# Patient Record
Sex: Male | Born: 1950 | Race: White | Hispanic: No | Marital: Married | Smoking: Former smoker
Health system: Southern US, Community
[De-identification: ages and names within clinical notes are randomized; demographics above are authoritative.]

## PROBLEM LIST (undated history)

## (undated) DIAGNOSIS — M1990 Unspecified osteoarthritis, unspecified site: Secondary | ICD-10-CM

## (undated) DIAGNOSIS — R0602 Shortness of breath: Secondary | ICD-10-CM

## (undated) DIAGNOSIS — Z8719 Personal history of other diseases of the digestive system: Secondary | ICD-10-CM

## (undated) DIAGNOSIS — E119 Type 2 diabetes mellitus without complications: Secondary | ICD-10-CM

## (undated) DIAGNOSIS — I2510 Atherosclerotic heart disease of native coronary artery without angina pectoris: Secondary | ICD-10-CM

## (undated) DIAGNOSIS — K219 Gastro-esophageal reflux disease without esophagitis: Secondary | ICD-10-CM

## (undated) DIAGNOSIS — C4490 Unspecified malignant neoplasm of skin, unspecified: Secondary | ICD-10-CM

## (undated) DIAGNOSIS — E785 Hyperlipidemia, unspecified: Secondary | ICD-10-CM

## (undated) DIAGNOSIS — Z8619 Personal history of other infectious and parasitic diseases: Secondary | ICD-10-CM

## (undated) DIAGNOSIS — I10 Essential (primary) hypertension: Secondary | ICD-10-CM

## (undated) DIAGNOSIS — I716 Thoracoabdominal aortic aneurysm, without rupture, unspecified: Secondary | ICD-10-CM

## (undated) DIAGNOSIS — J439 Emphysema, unspecified: Secondary | ICD-10-CM

## (undated) DIAGNOSIS — E876 Hypokalemia: Secondary | ICD-10-CM

## (undated) DIAGNOSIS — G4730 Sleep apnea, unspecified: Secondary | ICD-10-CM

## (undated) DIAGNOSIS — J189 Pneumonia, unspecified organism: Secondary | ICD-10-CM

## (undated) DIAGNOSIS — D509 Iron deficiency anemia, unspecified: Secondary | ICD-10-CM

## (undated) DIAGNOSIS — J449 Chronic obstructive pulmonary disease, unspecified: Secondary | ICD-10-CM

## (undated) DIAGNOSIS — K635 Polyp of colon: Secondary | ICD-10-CM

## (undated) DIAGNOSIS — R160 Hepatomegaly, not elsewhere classified: Secondary | ICD-10-CM

## (undated) HISTORY — DX: Hypokalemia: E87.6

## (undated) HISTORY — PX: TONSILLECTOMY: SUR1361

## (undated) HISTORY — DX: Essential (primary) hypertension: I10

## (undated) HISTORY — PX: THORACOABDOMINAL AORTIC ANEURYSM REPAIR: SHX2504

## (undated) HISTORY — DX: Shortness of breath: R06.02

## (undated) HISTORY — PX: HIP SURGERY: SHX245

## (undated) HISTORY — PX: TONSILLECTOMY: SHX5217

## (undated) HISTORY — DX: Thoracoabdominal aortic aneurysm, without rupture, unspecified: I71.60

## (undated) HISTORY — DX: Gastro-esophageal reflux disease without esophagitis: K21.9

## (undated) HISTORY — DX: Atherosclerotic heart disease of native coronary artery without angina pectoris: I25.10

## (undated) HISTORY — DX: Hyperlipidemia, unspecified: E78.5

## (undated) HISTORY — DX: Iron deficiency anemia, unspecified: D50.9

## (undated) HISTORY — DX: Morbid (severe) obesity due to excess calories: E66.01

## (undated) HISTORY — DX: Polyp of colon: K63.5

## (undated) HISTORY — DX: Chronic obstructive pulmonary disease, unspecified: J44.9

## (undated) HISTORY — DX: Personal history of other infectious and parasitic diseases: Z86.19

## (undated) HISTORY — DX: Unspecified malignant neoplasm of skin, unspecified: C44.90

## (undated) HISTORY — DX: Pneumonia, unspecified organism: J18.9

## (undated) HISTORY — DX: Unspecified osteoarthritis, unspecified site: M19.90

## (undated) HISTORY — PX: CHOLECYSTECTOMY: SHX55

## (undated) HISTORY — DX: Thoracoabdominal aortic aneurysm, without rupture: I71.6

---

## 2003-02-10 ENCOUNTER — Encounter: Payer: Self-pay | Admitting: Vascular Surgery

## 2003-02-11 ENCOUNTER — Ambulatory Visit (HOSPITAL_COMMUNITY): Admission: RE | Admit: 2003-02-11 | Discharge: 2003-02-11 | Payer: Self-pay | Admitting: Vascular Surgery

## 2003-02-22 ENCOUNTER — Ambulatory Visit (HOSPITAL_COMMUNITY): Admission: RE | Admit: 2003-02-22 | Discharge: 2003-02-22 | Payer: Self-pay | Admitting: Cardiology

## 2003-02-25 ENCOUNTER — Encounter: Payer: Self-pay | Admitting: Cardiology

## 2003-02-25 ENCOUNTER — Ambulatory Visit (HOSPITAL_COMMUNITY): Admission: RE | Admit: 2003-02-25 | Discharge: 2003-02-25 | Payer: Self-pay | Admitting: Cardiology

## 2003-03-11 ENCOUNTER — Encounter: Payer: Self-pay | Admitting: *Deleted

## 2003-03-15 ENCOUNTER — Encounter: Payer: Self-pay | Admitting: *Deleted

## 2003-03-15 ENCOUNTER — Encounter (INDEPENDENT_AMBULATORY_CARE_PROVIDER_SITE_OTHER): Payer: Self-pay | Admitting: *Deleted

## 2003-03-15 ENCOUNTER — Inpatient Hospital Stay (HOSPITAL_COMMUNITY): Admission: RE | Admit: 2003-03-15 | Discharge: 2003-03-23 | Payer: Self-pay | Admitting: Vascular Surgery

## 2003-03-16 ENCOUNTER — Encounter: Payer: Self-pay | Admitting: *Deleted

## 2003-03-17 ENCOUNTER — Encounter: Payer: Self-pay | Admitting: *Deleted

## 2003-03-18 ENCOUNTER — Encounter: Payer: Self-pay | Admitting: *Deleted

## 2003-03-19 ENCOUNTER — Encounter: Payer: Self-pay | Admitting: *Deleted

## 2003-03-20 ENCOUNTER — Encounter: Payer: Self-pay | Admitting: Thoracic Surgery (Cardiothoracic Vascular Surgery)

## 2003-03-20 ENCOUNTER — Encounter: Payer: Self-pay | Admitting: Vascular Surgery

## 2003-03-21 ENCOUNTER — Encounter: Payer: Self-pay | Admitting: Thoracic Surgery (Cardiothoracic Vascular Surgery)

## 2003-10-26 ENCOUNTER — Encounter: Admission: RE | Admit: 2003-10-26 | Discharge: 2003-10-26 | Payer: Self-pay | Admitting: Thoracic Surgery

## 2003-11-03 ENCOUNTER — Encounter: Admission: RE | Admit: 2003-11-03 | Discharge: 2003-11-03 | Payer: Self-pay | Admitting: Thoracic Surgery

## 2004-09-21 ENCOUNTER — Emergency Department (HOSPITAL_COMMUNITY): Admission: EM | Admit: 2004-09-21 | Discharge: 2004-09-21 | Payer: Self-pay | Admitting: Emergency Medicine

## 2004-10-26 ENCOUNTER — Ambulatory Visit (HOSPITAL_COMMUNITY): Admission: RE | Admit: 2004-10-26 | Discharge: 2004-10-26 | Payer: Self-pay | Admitting: Thoracic Surgery

## 2005-03-29 ENCOUNTER — Encounter (INDEPENDENT_AMBULATORY_CARE_PROVIDER_SITE_OTHER): Payer: Self-pay | Admitting: *Deleted

## 2005-03-29 ENCOUNTER — Ambulatory Visit (HOSPITAL_COMMUNITY): Admission: RE | Admit: 2005-03-29 | Discharge: 2005-03-29 | Payer: Self-pay | Admitting: Gastroenterology

## 2005-09-16 HISTORY — PX: CARDIAC CATHETERIZATION: SHX172

## 2005-10-31 ENCOUNTER — Inpatient Hospital Stay (HOSPITAL_COMMUNITY): Admission: EM | Admit: 2005-10-31 | Discharge: 2005-11-01 | Payer: Self-pay | Admitting: Emergency Medicine

## 2006-09-16 DIAGNOSIS — J449 Chronic obstructive pulmonary disease, unspecified: Secondary | ICD-10-CM

## 2006-09-16 HISTORY — DX: Chronic obstructive pulmonary disease, unspecified: J44.9

## 2006-11-29 ENCOUNTER — Inpatient Hospital Stay (HOSPITAL_COMMUNITY): Admission: EM | Admit: 2006-11-29 | Discharge: 2006-12-01 | Payer: Self-pay | Admitting: Emergency Medicine

## 2007-04-13 ENCOUNTER — Ambulatory Visit: Payer: Self-pay | Admitting: Internal Medicine

## 2007-04-27 ENCOUNTER — Ambulatory Visit: Payer: Self-pay | Admitting: Internal Medicine

## 2007-04-27 LAB — CONVERTED CEMR LAB
ALT: 91 units/L — ABNORMAL HIGH (ref 0–53)
AST: 64 units/L — ABNORMAL HIGH (ref 0–37)
Albumin: 3.7 g/dL (ref 3.5–5.2)
Alkaline Phosphatase: 81 units/L (ref 39–117)
BUN: 18 mg/dL (ref 6–23)
CO2: 32 meq/L (ref 19–32)
Calcium: 8.7 mg/dL (ref 8.4–10.5)
Chloride: 103 meq/L (ref 96–112)
Creatinine, Ser: 1.5 mg/dL (ref 0.4–1.5)
GFR calc Af Amer: 62 mL/min
GFR calc non Af Amer: 51 mL/min
Glucose, Bld: 126 mg/dL — ABNORMAL HIGH (ref 70–99)
Potassium: 4.2 meq/L (ref 3.5–5.1)
Sodium: 144 meq/L (ref 135–145)
TSH: 2.46 microintl units/mL (ref 0.35–5.50)
Total Bilirubin: 1 mg/dL (ref 0.3–1.2)
Total Protein: 6.7 g/dL (ref 6.0–8.3)

## 2007-06-30 DIAGNOSIS — Z8679 Personal history of other diseases of the circulatory system: Secondary | ICD-10-CM | POA: Insufficient documentation

## 2007-06-30 DIAGNOSIS — J449 Chronic obstructive pulmonary disease, unspecified: Secondary | ICD-10-CM | POA: Insufficient documentation

## 2007-08-18 ENCOUNTER — Ambulatory Visit: Payer: Self-pay | Admitting: Internal Medicine

## 2007-12-10 ENCOUNTER — Encounter: Admission: RE | Admit: 2007-12-10 | Discharge: 2007-12-10 | Payer: Self-pay | Admitting: Gastroenterology

## 2008-04-18 ENCOUNTER — Encounter: Admission: RE | Admit: 2008-04-18 | Discharge: 2008-06-07 | Payer: Self-pay | Admitting: Gastroenterology

## 2008-06-23 ENCOUNTER — Encounter: Admission: RE | Admit: 2008-06-23 | Discharge: 2008-08-15 | Payer: Self-pay | Admitting: Gastroenterology

## 2008-07-19 ENCOUNTER — Ambulatory Visit: Payer: Self-pay | Admitting: Thoracic Surgery

## 2011-01-29 NOTE — Assessment & Plan Note (Signed)
[facility]                             PULMONARY OFFICE NOTE   Alfred Carney, Alfred Carney                         MRN:          [id number]  DATE:08/18/2007                            DOB:          Jul 23, 1951    This is a pulmonary final follow-up office visit.   HISTORY:  This is a 60 year old white male with morbid obesity, status  post smoking cessation in March 2008, with a progressive weight gain  superimposed on evidence of COPD by PFTs.  I tried him empirically on  Spiriva which he says did nothing to improve his exercise tolerance so  he stopped it.  He rarely feels he needs Combivent.  Overall, feels he  is holding his own despite another 16 pounds of weight gain since his  previous visit.  He denies any PND or orthopnea or daytime  hypersomnolence.   PHYSICAL EXAMINATION:  VITAL SIGNS:  He is afebrile with stable vital  signs.  GENERAL APPEARANCE:  He is a pleasant, ambulatory, obese white male who  has gained another 16 pounds, up to 209 now.  HEENT:  Unremarkable. Oropharynx clear.  LUNGS:  Lung fields are clear to auscultation and percussion  bilaterally.  CARDIOVASCULAR:  Regular rhythm without murmurs, rubs, or gallops.  ABDOMEN:  Soft, benign.  EXTREMITIES:  Warm without calf tenderness, clubbing, cyanosis, or  edema.   LABORATORY DATA:  From his last visit on April 27, 2007, showed a TSH  that was 2.46 and SGOT of 64 and an SGPT of 91 that he says Dr. Frazier Richards  is aware of.   PFTs reviewed with the patient from April 27, 2007, indicating an FEV1  of 54% predicted with a ratio of 45% consistent with at least moderate  air flow obstruction and a diffusion capacity of 45% consistent with  emphysematous features.   His chest CT scan shows no definite emphysema but is worrisome and does  show a very tiny nodule in the right major fissure.   IMPRESSION:  1. Moderate chronic obstructive pulmonary disease with no significant  improvement on Spiriva.  I believe this is because his main      limiting problem is obesity with further weight gain related to      smoking cessation.  I discussed calorie balance issues in detail      with him and strongly recommended consideration for nutrition      evaluation.  I also recommended that he exercise to a level where      he is short of breath but not out of breath 30 minutes daily.  I      used the example of a bank account to try to help him understand      that a calorie balance is exactly that, namely the relationship      between how many calories he takes in versus burns up will      determine ultimately his weight.  We went over these same issues      before but he has yet to completely grasp it.  2. Mild elevation of  liver function tests may be nothing more than      fatty steatosis and I understand that Dr. Frazier Richards is already      working this up. Certainly one of our GI doctors will be happy to      see him if Dr. Frazier Richards feels it is necessary but I will defer that      Dr. Dr. Oliver Pila capable hands.  3. He has a very tiny nodule on the right middle lobe fissure that is      probably benign.  I did recommend a chest x-ray at three months and      then yearly thereafter.     Charlaine Dalton. Sherene Sires, MD, [facility]  Electronically Signed    MBW/MedQ  DD: 08/18/2007  DT: 08/19/2007  Job #: [id number]   cc:   [facility] Dr. Frazier Richards

## 2011-01-29 NOTE — Assessment & Plan Note (Signed)
[facility]                             PULMONARY OFFICE NOTE   Alfred Carney, Alfred Carney                         MRN:          [id number]  DATE:04/27/2007                            DOB:          [birth year]    HISTORY OF PRESENT ILLNESS:  This is a 60 year old white male with  morbid obesity who with a baseline weight of 240 when he stopped smoking  in March of 2008, weighed about 280 and has gained another 13 to 15  pounds since that time. He complains of dyspnea walking up and down the  aisle at the grocery store, but also gives out in his back, knees, and  legs. He does have handicapped parking but does not always use it. He  denies any variability in terms of his dyspnea or associated orthopnea,  PND, leg swelling, fevers, chills, sweats, purulent sputum, or sinus or  reflux symptoms.   PHYSICAL EXAMINATION:  GENERAL:  An obese, ambulatory white man in no  acute distress.  VITAL SIGNS:  Stable.  HEENT:  Unremarkable. Oropharynx clear.  LUNGS:  Fields reveal diminished breath sounds with no wheezes.  HEART:  Regular rate and rhythm. Without murmur, rub, or gallop.  ABDOMEN:  Soft, benign.  EXTREMITIES:  Warm without calf tenderness, cyanosis, clubbing, or  edema.   LABORATORY DATA:  CT scan of the chest was reviewed from April 02, 2007  showing prominent lymphatic tissue in the hila but this is non-  specific finding.   PFT's indicate an FEV1 of 54% predicted with a ratio of 45%, consistent  with severe COPD with inspiratory plateau, mild inspiratory truncation.   IMPRESSION:  1. This patient has severe chronic obstructive pulmonary disease and      is not impressed that Combivent helps. I am going to try him,      therefore, on Spiriva 1 capsule every morning and spend extra time      teaching him how to use it effectively.  2. He clearly is suffering from obesity with deconditioning as well. I      spent extra time reviewing this issue in the  context of a calorie      balance sheet with giving him a goal weight of 202 pounds. I have      also recommended a TSH be checked today to complete the workup.  3. Finally, he does have an abnormal CT scan with prominent lymphatic      tissue in the hila. I suspect this is related to smoking and all I      would do is repeat a chest x-ray when he returns in 3 months.     Charlaine Dalton. Sherene Sires, MD, [facility]  Electronically Signed   MBW/MedQ  DD: 04/27/2007  DT: 04/28/2007  Job #: [id number]   cc:   Dr. Lyn Hollingshead - [facility]

## 2011-01-29 NOTE — Letter (Signed)
July 19, 2008   Billee Cashing, PA  [facility]  [address]  [place], [place]  [address]   Re:  Alfred, [name]                  DOB:  1951-03-31   Dear Ms. Karleen Hampshire,   This patient we saw in 2006 with left costal cartilage pain.  He had a  previous aneurysm repair in which they did a thoracoabdominal incision  and cut across the left costal margin.  At that time, he was having pain  along the left costal margin and we proceeded in doing trigger point  injections and then eventually excise the wire and excise an area of  margins.  He said at first, this helped, but later the day, he says that  it really never helped and he decided not to come back.  He comes back  today with the same type of pain with dysesthesias over the margin and  some palpable trigger point is at approximately 2-3 cm lateral to the  scar.  I discussed this.  His blood pressure was 161/100, pulse 63,  respirations 20, and sats were 96%.  I discussed the situation with him  and told him that if he continued to have pain, this probably will never  go away, that we had really tried  just about everything you could, but  then I would be willing to try trigger point injection with steroids and  Xylocaine.  He said he only has had about a week and was not in favor of  it, and would be happy to reinstitute this.  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  I will be happy to see him  at anytime.   Ines Bloomer, M.D.  Electronically Signed   DPB/MEDQ  D:  07/19/2008  T:  07/20/2008  Job:  [id number]

## 2011-01-29 NOTE — Assessment & Plan Note (Signed)
[facility]                             PULMONARY OFFICE NOTE   LEAM, MADERO                         MRN:          [id number]  DATE:04/13/2007                            DOB:          Oct 03, 1950    REFERRING PHYSICIAN:  Florencia Reasons, Dr.   Jaquita Rector FOR CONSULTATION:  Dyspnea.   HISTORY:  This is a 60 year old white male who states he has been short  of breath ever since he was worked up for hematuria in 2004 and found to  have an aneurysm.  Postoperatively he never got back to normal.  He  has also gained since that time around 40-50 pounds.  He describes not  much difference between his good and bad days.  Presently he is short of  breath walking 15 feet carrying groceries to the car.  In addition, he  is slowed down by his left hip and really, in retrospect, cannot  consistently tell me which one slows him down more.  He denies any  exertional chest pain, significant variability with weather  environmental change and has not yet tried using Combivent immediately  before exercise to see to what extent it helps.   PAST MEDICAL HISTORY:  Significant for:  1. Hypertension for which he says he is on metapalol at 100/25 (to      my knowledge it does not come that way).  2. Emphysema.  3. Hyperlipidemia.   He is status post cholecystectomy and remote back surgery.   ALLERGIES:  Unknown.   SOCIAL HISTORY:  He quit smoking in March of 2008 and at that time had a  cough which has subsequently resolved.  Minimally productive cough.   MEDICATIONS:  Zetia, metoprolol, Prilosec and aspirin, with p.r.n.  ibuprofen, oxycodone and Combivent.   FAMILY HISTORY:  His mother had emphysema and was a smoker.  Also,  brother had asthma.   REVIEW OF SYSTEMS:  Taken in detail on the worksheet.  Positive for  almost every review of systems, none with recent exacerbation.   PHYSICAL EXAMINATION:  This is an obese white male with somewhat of a  hopeless,  helpless affect and attitude.  He is 292 pounds.  He is  afebrile, normal vital signs.  HEENT:  Unremarkable.  OROPHARYNX:  Clear.  LUNG FIELDS:  Clear bilaterally to auscultation and percussion.  HEART:  Regular rate and rhythm without murmur, gallop or rub.  No  increase in P2.  ABDOMEN:  Soft, obese, benign.  EXTREMITIES:  Warm without any calf tenderness, cyanosis, clubbing and  edema.   A CT scan of the chest was reviewed from April 02, 2007, and shows a  hiatal hernia with prominent hilar adenopathy, prominent lymphatic  tissue in the hila but none with pathologic dimensions.   No nodes with pathologic dimensions (greater than 2 cm).   IMPRESSION:  Morbid obesity with poor abdominal compliance and  progressive weight gain status post abdominal aortic aneurysm.  All tie  in clinically to his progressive decline in activity tolerance since  abdominal aortic aneurysm four years ago.  I suspect he  also has a  component of deconditioning and I am not sure to what extent he has  chronic obstructive pulmonary disease.   However, if there enough dynamic hyperinflation or reversible asthma to  treat I would like to try to identify it, and therefore I asked him to  do two things.  First was to use Combivent immediately before exercise  to see to what extent improved, and second is to bring all of his  medicines back in 4 to 6 weeks, check PFTs, at which point I would like  to consider some other medication besides high dose metoprolol for his  hypertension, since to the extent that he is asthmatic he probably  should not be on high dose beta-blockers, evenly relatively specific  ones like metoprolol.   I understand he was here because of concern with a CT showing prominent  lymphatic tissue.  However, none of the lymph nodes that are seen are  pathologic in size and I do not believe this has anything directly to do  with his dyspnea. Hilar adenopathy is either benign/inflammatory or  a  late sign of a serious problem but either way does not need any form of  early intervention.   I spent extra time going over these issues with the patient and also  teaching him optimal MDI technique and  I encouraged him to use the  Combivent up to 2 puffs every 6 hours and return here for PFTs with all  of his medicines in hand to see to what extent he has reversible airflow  obstruction, either clinically or by PFTs.     Charlaine Dalton. Sherene Sires, MD, [facility]  Electronically Signed    MBW/MedQ  DD: 04/13/2007  DT: 04/14/2007  Job #: [id number]   cc:   Florencia Reasons, MD at [facility] on [address].

## 2011-02-01 NOTE — H&P (Signed)
NAME:  Alfred Carney, Alfred Carney NO.:  [id number]   MEDICAL RECORD NO.:  [id number]                   PATIENT TYPE:  OIB   LOCATION:  NA                                   FACILITY:  [facility]   PHYSICIAN:  Peter M. Swaziland, M.D.               DATE OF BIRTH:  [birth year]   DATE OF ADMISSION:  04/24/2003  DATE OF DISCHARGE:                                HISTORY & PHYSICAL   HISTORY OF PRESENT ILLNESS:  Alfred Carney is a 60 year old white male with a  history of hypertension, tobacco abuse, and hypercholesterolemia who was  seen for preoperative evaluation after he was diagnosed with an abdominal  aortic aneurysm.  The patient was being evaluated for hematuria where a CT  scan had an incidental finding of an 8 cm suprarenal aneurysm.  To further  evaluate his cardiac this patient subsequently was referred for an adenosine  Cardiolite study.  This demonstrated the focal area of ischemia in the mid  inferior walls as well as a focal area at the apex.  Left ventricular  function was normal.  The patient does report that he has had some chest  discomfort with exertion over the past several months.  He also reports  awakening Friday night with chest tightness that lasted throughout the night  and half the day Saturday.  This has now resolved.  Because of his abnormal  Cardiolite study is now admitted for cardiac catheterization to see if he  would tolerate aneurysm surgery.   PAST MEDICAL HISTORY:  1. Hypertension,  2. Hypercholesterolemia.  3. History of hematuria.  4. History of [facility] spotted fever.  5. He has had previous cholecystectomy.  6. Left hip surgery.  7. Tonsillectomy.  8. He has had a cyst removal from his back.   ALLERGIES:  No known drug allergies.   CURRENT MEDICATIONS:  1. Hydrochlorothiazide once a day.  2. The patient states he took Lipitor previously but made his chest feel     funny so he stopped taking it.   SOCIAL HISTORY:  The  patient works for [employer], he is married, he  has four children.  He smokes 1/3 to 1 pack per day and has been a smoker  for 40 years.  He denies alcohol use.   FAMILY HISTORY:  Father is age 47 and has had previous myocardial  infarctions and stent.  Mother is age 49 in good health.  He has four  siblings in good health.   REVIEW OF SYSTEMS:  The patient does describe intermittent bilateral flank  pain.  He has also had some low back pain that has been described as mild.  He has a history of acid reflux disease.  Other review of systems are  negative.   PHYSICAL EXAMINATION:  GENERAL:  Obese white male in no current distress.  VITAL SIGNS:  Weight is 255, blood  pressure 120/82, pulse 56 and regular.  HEENT:  Pupils equal, round, reactive to light and accommodation.  Extraocular movements are full.  Oropharynx is clear.  NECK:  Supple without JVD, adenopathy, thyromegaly, or bruits.  LUNGS:  Clear to auscultation and percussion.  CARDIAC:  Regular rate and rhythm without gallops, murmurs, rubs, or clicks.  ABDOMEN:  Obese, soft with a midline pulsatile abdominal mass.  EXTREMITIES:  Without edema.  Pulses are 2+ and symmetric.  NEUROLOGICAL:  Nonfocal.   LABORATORY DATA:  ECG shows normal sinus rhythm and nonspecific T-wave  abnormality, left axis deviation.  Chest x-ray shows increased pulmonary  markings but no active disease.  Heart size is normal.  Hemoglobin is 15.9,  white count 9000, platelets 299,000.  Urinalysis shows occasional white  cells and red cells, otherwise benign.  Coags are normal. CPK-MB is 1.3,  troponin is less than 0.05.  Chemistry panel is normal.   IMPRESSION:  1. Chest pain with abnormal Cardiolite study consistent with obstructive     coronary disease.  2. A large suprarenal abdominal aortic aneurysm.  3. Hypertension.  4. Hypercholesterolemia.  5. Tobacco abuse.  6. History of hematuria.  7. Status post cholecystectomy.   PLAN:  The  patient to be admitted for cardiac catheterization with further  therapy pending these results.                                               Peter M. Swaziland, M.D.    PMJ/MEDQ  D:  02/21/2003  T:  02/21/2003  Job:  [id number]   cc:   Di Kindle. Edilia Bo, M.D.  [address]  [place]  [place] [address]  Fax: [phone]   Gabriel Earing, M.D.  [address]  [place]  [place] [address]  Fax: [phone]

## 2011-02-01 NOTE — Discharge Summary (Signed)
NAME:  Alfred Carney, Alfred Carney NO.:  [id number]   MEDICAL RECORD NO.:  [id number]          PATIENT TYPE:  INP   LOCATION:  5148                         FACILITY:  [facility]   PHYSICIAN:  Lonia Blood, M.D.DATE OF BIRTH:  01-Oct-1950   DATE OF ADMISSION:  11/28/2006  DATE OF DISCHARGE:  12/01/2006                               DISCHARGE SUMMARY   PRIMARY CARE PHYSICIAN:  Gabriel Earing, M.D.   DISCHARGE DIAGNOSES:  1. Acute bronchospastic exacerbation of chronic obstructive pulmonary      disease.      a.     Smoking cessation counseling carried out.      b.     Combivent therapy initiated.  2. Possible sleep apnea.  Outpatient followup recommended.  3. Community-acquired pneumonia.  Intravenous antibiotic therapy      initiated, transitioning to p.o. antibiotics.  4. Hypertension.  5. Hyperlipidemia.  6. Degenerative joint disease.  7. Coronary artery disease, with 70% occlusion of the right coronary      artery per cardiac catheterization.  8. Status post tonsillectomy.  9. Status post cholecystectomy.  10.Status post left hip surgery with bone grafting.  11.Gastroesophageal reflux disease.  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  13.Possible history of [place] Spotted Fever.  14.Hypokalemia, diuretic induced, resolved.   DISCHARGE MEDICATIONS:  1. Prilosec over the counter daily.  2. HCTZ 25 mg daily.  3. Zetia 10 mg daily.  4. Aspirin 81 mg daily.  5. Humibid 600 mg b.i.d. for 7 days, then stop.  6. Combivent inhaler 2 puffs q.i.d.  7. Avelox 400 mg daily for 7 days, then stop.  8. Prednisone 10 mg tablets on a taper dose, beginning at 60 mg b.i.d.      for 2 days, then working down every 2 days to 1 daily for 2 days,      then stop.  9. Potassium chloride 20 mEq daily.   FOLLOWUP:  The patient is advised to follow up with Dr. Andi Devon on  Thursday or Friday of this week (it is presently Monday).  At that time,  a BMET  should be obtained to ensure that the patient's potassium remains  balanced.  Consideration should be given to arranging the patient with  an outpatient polysomnogram to evaluate for possible sleep apnea.   PROCEDURES:  None.   CONSULTATIONS:  None.   HOSPITAL COURSE:  [name] is a 60 year old gentleman who  presented to the hospital on November 28, 2006 with complaints of chills,  shortness of breath, myalgia, and fever to 102.  He had been followed by  his primary care physician who was treating him in the outpatient  setting.  Unfortunately, he did not improve with this therapy.  In the  emergency room, he was diagnosed with a pneumonia and also found to be  suffering with an acute exacerbation of bronchospastic COPD.  He was  admitted to the acute units.  A hypokalemia was appreciated.  This was  felt to be secondary to his diuretic therapy.  Magnesium level was  checked and was found to  be normal.  With inhalation therapy and  systemic steroids, the patient's bronchospasm was able to be arrested.  Respiratory status remained stable throughout the remainder of  hospitalization.  With IV antibiotics, the patient clinically improved  significantly from the standpoint of a possible pneumonia.  At the time  of discharge, the patient's respiratory status was better than it's  been for years now.  The direct link between the patient's emphysema  and his tobacco abuse was explained to the patient.  The absolute need  to discontinue smoking completely was also explained to the patient.  He  voiced understanding and appeared to be highly motivated to do so.   On December 01, 2006, the patient had returned to his baseline respiratory  status.  Vital signs were stable, and he was afebrile.  The patient was  cleared for discharge.      Lonia Blood, M.D.  Electronically Signed     JTM/MEDQ  D:  12/01/2006  T:  12/01/2006  Job:  [id number]   cc:   Gabriel Earing, M.D.

## 2011-02-01 NOTE — Discharge Summary (Signed)
NAME:  [name], GEAR NO.:  [id number]   MEDICAL RECORD NO.:  [id number]                   PATIENT TYPE:  INP   LOCATION:  3307                                 FACILITY:  [facility]   PHYSICIAN:  Di Kindle. Edilia Bo, M.D.        DATE OF BIRTH:  1951/07/29   DATE OF ADMISSION:  03/15/2003  DATE OF DISCHARGE:  03/23/2003                                 DISCHARGE SUMMARY   PRIMARY ADMITTING DIAGNOSIS:  Thoracoabdominal aortic aneurysm.   ADDITIONAL/DISCHARGE DIAGNOSES:  1. An 8-cm type IV thoracoabdominal aortic aneurysm.  2. Hypertension.  3. Hyperlipidemia.  4. Degenerative joint disease.  5. History of [place] Spotted Fever.   PROCEDURES PERFORMED:  1. Repair of type IV thoracoabdominal aortic aneurysm via left thoracotomy     approach.  2. Aorta to right femoral and left external iliac artery bypass graft.  3. Implantation of two right renal arteries and one left renal artery.   HISTORY:  Alfred Carney is a 60 year old white male who was undergoing a  workup for hematuria recently.  He was found, on CT scan of the abdomen and  pelvis, to have an 8-cm incidental abdominal aortic aneurysm.  He was  referred to Dr. Waverly Ferrari for further evaluation.  An arteriogram  was performed, which confirmed an 8-cm abdominal aortic aneurysm, which  extended to the level of the superior mesenteric artery and involved both  renal arteries, extending to the aortic bifurcation.  It was felt that in  light of the size of this aneurysm as well as the extent that he would  require repair at this time and this would be best performed via a  thoracoabdominal approach.  He was also seen by Dr. Madilyn Fireman, and it was agreed  that this was his best course of action.  He saw Dr. Peter Swaziland  preoperatively and was cleared from a cardiac standpoint to proceed.   HOSPITAL COURSE:  He was admitted on March 15, 2003, and was taken to the  operating room where he  underwent thoracoabdominal aortic aneurysm repair  with reimplantation of the renal arteries, as described in detail above.  He  tolerated the procedure well and was transferred to the SICU in stable  condition.   He was initially maintained on nitroglycerin drip for blood pressure control  as well as renal-dosed dopamine.  Initially, his creatinine trended upward  and ultimately leveled off at 2.9 on postop day two; this has trended back  downward and has stabilized at 1.8.  He has continued to have good urine  output, and ultimately, the renal-dosed dopamine was weaned and  discontinued.  He remained on ventilatory support secondary to sedation  throughout most of the day on postop day one.  As his neurological function  improved and he began to awaken later in the day on postop day one, he was  able to be extubated.  He was started on  nebulizer treatments and, from a  pulmonary standpoint, has remained stable.   By postop day two, his NG tube was able to be removed.  Also, his lumbar  drain was removed.  He was mobilized in the unit.  His chest tube drainage  trended downward, and by postop day three, his chest tube was able to be  removed.  His bowel function has slowly returned.   On postop day four, he was started on clear liquids; this was slowly  advanced to full liquids and finally to a regular diet by postop day six.  He has been treated with aggressive pulmonary toilet measures and presently  is maintaining O2 sats of greater than 90% on room air.  He is tolerating a  regular diet without difficulty.  He is having normal bowel movements and is  passing flatus.  He has remained afebrile, and all vital signs have been  stable.  He has been ambulating in the halls without difficulty.  His renal  failure has stabilized, and his creatinine is presently 1.8 with a BUN of  28.  He has maintained good urine output.  His other labs show a hemoglobin  of 11, hematocrit of 32, white  blood cell count of 11,900, platelets 197,  sodium is 140, potassium 3.7.  Initially, he had some elevation of his blood  sugars, requiring the Glucomander protocol, and this was later switched to  Lantus insulin; his blood sugars have been stable over the past several  days, running in the low 100s.  It was felt this was possibly stress-induced  hyperglycemia and will most likely need to be worked up as an outpatient.  He is otherwise doing well.  It is felt that if he continues to remain  stable, he will be ready for discharge home probably on March 23, 2003.   DISCHARGE MEDICATIONS:  1. Pepcid 20 mg b.i.d.  2. Hydrochlorothiazide 25 mg daily.  3. Atacand 16 mg daily.  4. Enteric-coated aspirin 325 mg daily.  5. Tylox one to two q.4h. p.r.n. for pain.   DISCHARGE INSTRUCTIONS:   ACTIVITY:  1. He is to refrain from driving, heavy lifting, or strenuous activity.  2. He may continue daily walking and use of his incentive spirometer.   WOUND CARE:  He was asked to shower daily and clean his incisions with soap  and water.   DISCHARGE FOLLOW UP:  1. The CVTS nurse will remove his staples on Tuesday, March 29, 2003, at 9     a.m.  2. He will then follow up with Dr. Edilia Bo on Wednesday, April 13, 2003, at     2:00 p.m.; he will have ankle-brachial indices at that time.  Of note,     his postop ABIs were greater than 1.0 on the right and 0.79 on the left.  3. He was also asked to make a followup appointment in the next one to two     weeks with his primary care physician for reevaluation of his     hyperglycemia.     Coral Ceo, P.A.                        Di Kindle. Edilia Bo, M.D.    GC/MEDQ  D:  03/22/2003  T:  03/22/2003  Job:  [id number]   cc:   Loraine Leriche C. Vernie Ammons, M.D.  [address]  [place]  [place] [address]  Fax: [phone]   Gabriel Earing, M.D.  [address]  [place]  [place] [address] Fax: [phone]   Peter M. Swaziland, M.D.  [address]   [place], [place] [address]  Fax: [phone]    cc:   Veverly Fells. Vernie Ammons, M.D.  [address]  [place]  [place] [address]  Fax: [phone]   Gabriel Earing, M.D.  [address]  [place]  [place] [address]  Fax: [phone]   Peter M. Swaziland, M.D.  [address]  [place], [place] [address]  Fax: [phone]

## 2011-02-01 NOTE — H&P (Signed)
NAME:  Alfred Carney, Alfred Carney NO.:  [id number]   MEDICAL RECORD NO.:  [id number]          PATIENT TYPE:  INP   LOCATION:  3707                         FACILITY:  [facility]   PHYSICIAN:  Peter M. Swaziland, M.D.  DATE OF BIRTH:  02/22/1951   DATE OF ADMISSION:  10/31/2005  DATE OF DISCHARGE:                                HISTORY & PHYSICAL   HISTORY OF PRESENT ILLNESS:  Alfred Carney is a 60 year old white male, with  known history of coronary disease, hypertension, hyperlipidemia, who  presents for evaluation of chest pain today. The patient had no prior  history of chest pain until this morning where approximately 9:30 to 10 a.m.  he developed retrosternal chest pain radiating across his chest. He had no  associated shortness of breath, nausea, vomiting or diaphoresis. The pain  was worse with exertion. He went to [facility] where his pain was relieved  with sublingual nitroglycerin and aspirin but his pain duration was  approximately two hours. The patient did undergo cardiac catheterization  June of 2004. At that time, he had a stress Cardiolite study which suggested  inferoapical ischemia. Subsequent cardiac catheterization demonstrated a 50-  60% stenosis in the mid-right coronary artery. He had small distal vessels.  He was treated medically.   PAST MEDICAL HISTORY:  1.  Status post repair of thoraco-abdominal aneurysm type 4 in June of 2004      via a left thoracotomy approach. He also had aorto right femoral and      left iliac bypass grafting at that time with reimplantation of his renal      arteries.  2.  Status post excision of painful costicartilage in February of 2006.  3.  Hypertension.  4.  Hyperlipidemia.  5.  Degenerative joint disease.  6.  History of [place] Spotted Fever.  7.  Status post cholecystectomy.  8.  Status post left hip surgery with bone grafting.  9.  Status post tonsillectomy.   MEDICATIONS:  1.  HCTZ 25 milligrams per day.  2.   Pain medication daily.  3.  Cholesterol medication daily.   ALLERGIES:  He has no known drug allergies.   SOCIAL HISTORY:  He is married. He has four children. He works with a  Location manager. He smokes less than or equal to one pack per day. He has  been smoking for 40 years.   FAMILY HISTORY:  His father had a myocardial infarction and stent. He is now  35 and doing well. His mother is alive and well. He has four siblings who  are alive and well.   REVIEW OF SYSTEMS:  Otherwise unremarkable.   PHYSICAL EXAMINATION:  GENERAL: The patient is an obese white male in no  apparent distress.  VITAL SIGNS: Blood pressure 127/70, pulse 50 and regular. He is afebrile.  Saturations are 98% on room air.  LUNGS: Clear.  CARDIOVASCULAR: Regular rate and rhythm without murmur, rub, or gallop.  ABDOMEN: Old surgical scar. There are no masses or bruits.  EXTREMITIES: Femoral and pedal pulses are 2+ and symmetric. He has no edema.  NEUROLOGICAL: Nonfocal.   LABORATORY DATA:  Chest x-ray shows COPD with no active disease. ECG shows  normal sinus rhythm with T-wave inversion in leads I, aVL and V3. Sodium is  140, potassium 3.6, chloride 106, CO2 29, BUN 20, creatinine 1.6, glucose of  99. Hemoglobin of 16.3. Initial CK was less than 1.0. Subsequently was 1.3.  Troponin was less than 0.05. Subsequent level is 0.07.   IMPRESSION:  1.  Unstable angina pectoris.  2.  History of coronary disease.  3.  Status post thoraco-abdominal aneurysm repair.  4.  Hypertension.  5.  Hyperlipidemia.  6.  Tobacco abuse.   PLAN:  We will admit to telemetry. We will rule out myocardial infarction.  We will treat with subcutaneous Lovenox and blood pressure control. We will  not give beta blocker due to his slow resting pulse. We will anticipate  cardiac catheterization tomorrow afternoon.           ______________________________  Peter M. Swaziland, M.D.     PMJ/MEDQ  D:  10/31/2005  T:  10/31/2005  Job:   [id number]   cc:   Gabriel Earing, M.D.  Fax: [phone]   Ines Bloomer, M.D.  [address]  [place]  [place] [address]

## 2011-02-01 NOTE — H&P (Signed)
NAME:  Alfred Carney, Alfred Carney NO.:  [id number]   MEDICAL RECORD NO.:  [id number]          PATIENT TYPE:  EMS   LOCATION:  MAJO                         FACILITY:  [facility]   PHYSICIAN:  Manning Charity, MD     DATE OF BIRTH:  [birth year]   DATE OF ADMISSION:  11/28/2006  DATE OF DISCHARGE:                              HISTORY & PHYSICAL   PRIMARY CARE PHYSICIAN:  Gabriel Earing, M.D.   CHIEF COMPLAINT:  Shortness of breath.   HISTORY OF PRESENT ILLNESS:  Alfred Carney is a 60 year old man with past  medical history as noted below who presents with two weeks of chills,  shortness of breath, myalgias and fevers with a T-max of 102 degrees.  He presented to his primary care physician with these complaints two  weeks ago.  At that time, he was started on prednisone and an unknown  antibiotic.  Despite this treatment, however, he has continued to have  persistent symptoms.  He does not feel like his symptoms have gotten  worse per se but they have certainly not gotten better.  Of note, he  does deny any chest pain accompanying the symptoms.   CURRENT MEDICATIONS:  1. Prilosec over-the-counter once a day.  2. Hydrochlorothiazide 25 mg daily.  3. Zetia 10 mg daily.  4. Aspirin 81 mg daily.  5. He does use ibuprofen as needed.  6. He uses oxycodone 20 mg occasionally for severe pain from his      abdominal surgery.   ALLERGIES:  NO KNOWN DRUG ALLERGIES.   PAST MEDICAL HISTORY:  1. He had a thoracoabdominal aneurysm, status post repair.  2. Hypertension.  3. Hyperlipidemia.  4. Degenerative joint disease.  5. Coronary artery disease with a 70% occlusion in the RCA per      catheterization.  6. He is status post cholecystectomy.  7. Status post tonsillectomy.  8. Status post left hip surgery with bone grafting.  9. He has acid reflux disease.  10.He has a possible history of [facility] spotted fever, although      it is very unclear whether this was an actual diagnosis  or not.   FAMILY HISTORY:  Noncontributory, but his mother is living with  osteoarthritis and diabetes mellitus.  Father is living with coronary  artery disease.  He has one sister with multiple sclerosis and a brother  with unknown health problems.   SOCIAL HISTORY:  He quit smoking two weeks ago at the onset of his  symptoms.  Before that, he has been a 1-2 pack-per-day smoker and  recently cut down to half a pack per day.  He denies any alcohol use.  He is married, works in Biomedical engineer.   REVIEW OF SYSTEMS:  Positive for fever and chills as noted above.  Also  positive for fatigue and myalgias.  Negative for any weight changes.  Negative for abdominal pain, changes in his bowel movements.  Positive  for mild nausea but no emesis.  No hematochezia, no melena.  He does  report some orthopnea but it is unclear whether this is true orthopnea  or whether he sleeps propped up on pillows because of right shoulder  pain.  He denies any nocturia.  He denies any peripheral edema.  He  denies any cough or hemoptysis but does feel like he has some chest  congestion.  Denies any dysuria.   PHYSICAL EXAMINATION:  GENERAL APPEARANCE:  He is an obese male,  slightly diaphoretic, otherwise no acute distress.  VITAL SIGNS:  Temperature 99.1, pulse 74, blood pressure 126/75,  respiratory rate 18 and he is sating 92% on 2 liters of oxygen.  NECK:  Obese, unable to determinate JVP based on this.  No thyromegaly  palpated.  CHEST:  He has poor air movement with diffuse expiratory wheezing.  No  distinct crackles heard.  CARDIOVASCULAR:  Regular rate and rhythm with no murmurs, rubs, or  gallops appreciated.  ABDOMEN:  Soft with multiple surgical scars.  He has normoactive bowel  sounds.  He does have tenderness to palpation in the left upper  quadrant.  EXTREMITIES:  Without any edema, no cyanosis.  SKIN:  Without any rashes or acute abnormalities.  NEUROLOGIC:  He is alert and oriented x4  and his exam is nonfocal.   LABORATORY DATA:  All we have is a hemoglobin of 18.4 and one BMET with  a sodium of 133, potassium 3.1, chloride 110, bicarbonate 24, BUN 23,  creatinine 1.8.  That would give him an anion gap of -1.  Another BMET  has a potassium of 2.9, anion gap 13.   We do not have any chest x-ray or imaging on him.   A 12-lead EKG is without any acute STT wave changes or other  abnormalities.   ASSESSMENT/PLAN:  1. Pneumonia.  Given his hypoxia, will admit him, treat him with Solu-      Medrol,  broad spectrum antibiotics  and nebulizer treatments.      Will check sputum culture, urine for Strep and Legionella antigens      and monitor his clinical course.  2. Hypokalemia.  On diuretic.  Will check magnesium as      hydrochlorothiazide could not have caused hypomagnesemia and      replete his potassium.  3. Renal insufficiency with baseline creatinine unknown.  Most      probably secondary to some mild dehydration secondary to the      pneumonia with diuretic therapy in addition to this.  Will hold his      diuretic right now while we hydrate him with IV fluids and avoid      nephrotoxic medications.  4. Possible anion gap.  Will check a CMET to further quantify this as      his true anion gap is unclear to me and proceed based on that.  If      he does truly have an anion gap, will check a lactate level, serum      osmolality and a      salicylate level.  5. Erythrocytosis.  Most likely secondary to smoking plus some      dehydration.  Will check a true CBC with differential, hydrate and      follow.      Manning Charity, MD     KK/MEDQ  D:  11/29/2006  T:  11/29/2006  Job:  [id number]

## 2011-02-01 NOTE — H&P (Signed)
NAME:  Alfred Carney, RUANE NO.:  [id number]   MEDICAL RECORD NO.:  [id number]                   PATIENT TYPE:  INP   LOCATION:  NA                                   FACILITY:  [facility]   PHYSICIAN:  Di Kindle. Edilia Bo, M.D.        DATE OF BIRTH:  1950-12-20   DATE OF ADMISSION:  03/15/2003  DATE OF DISCHARGE:                                HISTORY & PHYSICAL   CHIEF COMPLAINT:  Abdominal aortic aneurysm.   HISTORY OF PRESENT ILLNESS:  The patient is a 60 year old white male who was  referred to Dr. Di Kindle. Dickson by Dr. Veverly Fells. Ottelin for evaluation  of an abdominal aortic aneurysm.  Several months ago, he had developed high  fever and joint pain and saw his primary care physician, who treated him  with antibiotics for [facility] spotted fever.  When he returned for  followup, a urinalysis was performed which showed hematuria.  He was  referred to Dr. Vernie Ammons for workup for his hematuria and during the course  of the workup, underwent a CT scan.  This showed an incidental 8-cm  abdominal aortic aneurysm.  Because of this, he was referred to Dr.  Waverly Ferrari for evaluation.  An arteriogram was performed which  confirmed an 8-cm abdominal aortic aneurysm which extended to the level of  the superior mesenteric artery and involved both renal arteries, extending  to the aortic bifurcation.  The patient has had continued generalized  abdominal pain but no back pain.  He also denies nausea, vomiting,  constipation, hematochezia and melena or hematemesis.  It was recommended  that in light of the size of this aneurysm, that he proceed with surgical  repair at this time.  Also, he has been seen by Dr. Balinda Quails, as the  aneurysm will likely require a thoracoabdominal approach for repair.  He has  also seen Dr. Peter M. Swaziland and has undergone a Cardiolite study and a  cardiac catheterization and has been cleared to proceed.   PAST  MEDICAL HISTORY:  1. Hypertension.  2. Hyperlipidemia.  3. Questionable history of [facility] spotted fever, treated with     antibiotic therapy.  4. History of degenerative joint disease.   PAST SURGICAL HISTORY:  1. Cholecystectomy in 1992.  2. Tonsillectomy.  3. Left hip surgery in 1991 where a bone graft was taken from his leg and     grafted to his hip.   CURRENT MEDICATIONS:  1. Hydrochlorothiazide 25 mg q.a.m.  2. Atacand 16 mg q.p.m.  3. He is supposed to be taking Lipitor, but this caused a funny feeling in     his chest, therefore, he discontinued it himself.   ALLERGIES:  No known drug allergies.   FAMILY HISTORY:  His mother is age 62 and alive and well.  His father is age  13.  He has had a history of myocardial infarction.  He has four siblings  who are all alive and well.   SOCIAL HISTORY:  He is married and has four children.  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  He denies alcohol use.  He has  previously smoked one and a half to two packs of cigarettes per day but  currently has cut down to one-half pack per day.  He has smoked for about 35  years.   REVIEW OF SYSTEMS:  See history of present illness for pertinent positives  and negative.  Also, he has had occasional palpitations which have been  evaluated by Dr. Swaziland.  He also has generalized lower extremity edema  which has improved since he started on the hydrochlorothiazide.  He has some  shortness of breath with exertion.  He also has a history of degenerative  joint disease and has joint pain related to this.  He denies fevers, chills,  weight loss, recent infections, TIA symptoms, amaurosis fugax, visual  changes, dysphagia, syncope, chest pain, cough, orthopnea, paroxysmal  nocturnal dyspnea, dysuria, hematuria, nocturia, lower extremity  claudication symptoms, depression, anxiety, intolerance to heat or cold.   PHYSICAL EXAMINATION:  VITAL SIGNS:  Blood pressure is  164/104.  Pulse is 80  and regular.  Respirations 16 and unlabored.  GENERAL:  This is an obese white male in no acute distress.  HEENT:  Normocephalic, atraumatic.  Pupils equal, round and react to light  and accommodation.  Extraocular movements intact.  TMs and canals are clear  bilaterally.  Nares patent.  Oropharynx is clear with upper and lower  dentures in place.  NECK:  Neck supple without lymphadenopathy, thyromegaly or carotid bruits.  LUNGS:  Lungs clear to auscultation.  HEART:  Regular rate and rhythm without murmurs, rubs, or gallops.  ABDOMEN:  Abdomen soft, obese, nontender and nondistended with active bowel  sounds in all quadrants.  There is a palpable pulsatile midline mass.  EXTREMITIES:  No clubbing or cyanosis.  He has mild lower extremity edema.  Feet are warm and well-perfused.  Femoral, dorsalis pedis and posterior  tibial pulses are all 2+ and symmetrical.  NEUROLOGIC:  Cranial nerves II-XII are grossly intact.  Gait is without  impairment.  Motor and sensory are intact.   ASSESSMENT AND PLAN:  This is a 60 year old white male with an abdominal  aortic aneurysm of 8 cm who will be admitted to [facility] on March 15, 2003 for repair of the same.     Coral Ceo, P.A.                        Di Kindle. Edilia Bo, M.D.    GC/MEDQ  D:  03/11/2003  T:  03/12/2003  Job:  [id number]   cc:   Loraine Leriche C. Vernie Ammons, M.D.  [address]  [place]  [place] [address]  Fax: [phone]   Gabriel Earing, M.D.  [address]  [place]  [place] [address]  Fax: [phone]   Peter M. Swaziland, M.D.  [address]  [place], [place] [address]  Fax: [phone]    cc:   Veverly Fells. Vernie Ammons, M.D.  [address]  [place]  [place] [address]  Fax: [phone]   Gabriel Earing, M.D.  [address]  [place]  [place] [address]  Fax: [phone]   Peter M. Swaziland, M.D.  [address]  [place], [place] [address] Fax:  [phone]  

## 2011-02-01 NOTE — Op Note (Signed)
NAME:  Alfred Carney, Alfred Carney NO.:  [id number]   MEDICAL RECORD NO.:  [id number]          PATIENT TYPE:  OIB   LOCATION:  2899                         FACILITY:  [facility]   PHYSICIAN:  Ines Bloomer, M.D. DATE OF BIRTH:  [birth year]   DATE OF PROCEDURE:  10/26/2004  DATE OF DISCHARGE:                                 OPERATIVE REPORT   PREOPERATIVE DIAGNOSIS:  Painful costal cartilage, status post  thoracoabdominal incision.   POSTOPERATIVE DIAGNOSIS:  Painful costal cartilage, status post  thoracoabdominal incision.   OPERATION PERFORMED:  Excision of costal cartilage.   SURGEON:  Ines Bloomer, M.D.   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  Postoperatively she continues to have  severe pain over the left costal cartilage margin, where it had been divided  for the thoracoabdominal incision.  Trigger point injections were tried with  steroids and Xylocaine, and multiple types of pain medication with only just  some slight improvement.  It actually improved for approximately six months,  and then he had a severe recurrence of the pain.  The pain primarily comes  on when he moves and twists from one side to the other side.   A CT scan showed irregularity at the area of division and it was thought  that this may be there is either a neuroma or some type of entrapment of the  costal margin.  Excision of this area was recommended and also removed a lot  of the wire.  When all other avenues had been exhausted as far as pain  control, the patient agreed to this.   DESCRIPTION OF PROCEDURE:  The patient underwent general anesthesia; was  prepped and draped in the usual sterile manner.  A 3 cm incision was made  through the previous thoracoabdominal excision.  Dissection was carried down  to the subcutaneous tissues down to the area of the division.  This was done  with electrocautery.  The muscle was  reflected off the costal margin, and  using an Alexander periosteal elevator the fascia was placed back.  There  was a lot inflammation in this area.  The wire could be seen, and it was cut  and removed.  After the wire had been removed, the costal cartilage was  debrided superiorly and inferiorly over the area where it was cut -- in  order to free up any areas.  Old inflammatory cartilage was removed, to give  at least a 1.0 to 1.5 cm gap in the costal margin; hopefully to prevent any  entrapment or pain of any neuromas.   After this had been done, the area was irrigated copiously and then the  muscle was reclosed with interrupted 2-0 Vicryl, subcutaneous tissue with 3-  0 Vicryl and subcuticular stitch with 3-0 Vicryl and Dermabond for the skin.   The patient returned to the recovery room in stable condition.      DPB/MEDQ  D:  10/26/2004  T:  10/26/2004  Job:  [id number]   cc:   Di Kindle. Edilia Bo, M.D.  [address]  [address]  [place]  [place] [address]

## 2011-02-01 NOTE — Discharge Summary (Signed)
NAME:  Alfred Carney, Alfred Carney NO.:  [id number]   MEDICAL RECORD NO.:  [id number]          PATIENT TYPE:  INP   LOCATION:  3707                         FACILITY:  [facility]   PHYSICIAN:  Peter M. Swaziland, M.D.  DATE OF BIRTH:  Nov 15, 1950   DATE OF ADMISSION:  10/31/2005  DATE OF DISCHARGE:  11/01/2005                                 DISCHARGE SUMMARY   HISTORY OF PRESENT ILLNESS:  Alfred Carney is a 60 year old male with history  of coronary disease, hypertension, hyperlipidemia. 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 He is status  post aorta right femoral and left iliac bypass grafting as well as  reimplantation of his renal arteries at that time. The patient presented  with symptoms of chest pain on the morning of admission which was described  as a retrosternal chest pain. It seemed to be worse with exertion and  radiated across his chest. The patient had previous cardiac catheterization  2004 which showed 50-60% stenosis in the mid-right coronary which was a  small nondominant vessel. He had small distal vessel disease and was treated  medically. For details of his past medical history, social history, family  history and physical exam, please see admission history and physical.   LABORATORY DATA:  A pH 7.36, pCO2 of 52.6, bicarb 29. White count was 8800,  hemoglobin 16.4, hematocrit 48.8, platelets 233,000. Coags were normal.  Sodium 140, potassium 3.5, chloride 100, CO2 32, glucose 91, BUN 15,  creatinine 1.4. Liver function studies were normal except for an ALT of 55.  Serial CPKs and MBs x3 were negative. Serial troponin Is x3 were negative.  Cholesterol was 189, triglycerides 182, HDL 31, LDL 122. ECG showed normal  sinus rhythm with nonspecific T-wave abnormality. Chest x-ray showed COPD  with scarring but no active disease.   HOSPITAL COURSE:  The patient was admitted to telemetry. He was treated with  subcutaneous  Lovenox. He was placed on nitroglycerin. On November 01, 2005,  he underwent cardiac catheterization. This demonstrated no significant  coronary disease in the left main, LAD or left circumflex distribution. In  the right coronary artery is a small vessel which had a 60% stenosis in the  midvessel and a very tortuous segment right after the takeoff of the RV  marginal branch. Left ventricular function was normal with an ejection  fraction of 60%. Compared to his prior study in 2004, there was no  significant change. For this reason, we felt this pain was noncardiac. We  recommended continuing medical therapy, chronic blood pressure control with  metoprolol and HCTZ. We did recommend the addition of Zocor for his  hyperlipidemia and recommended smoking cessation. He was discharged home on  same day in stable condition.   DISCHARGE DIAGNOSES:  1.  Chest pain.  2.  Coronary artery disease.  3.  Status thoracoabdominal aneurysm repair.  4.  Hypertension.  5.  Hyperlipidemia.  6.  Tobacco abuse.   DISCHARGE MEDICATIONS:  1.  Metoprolol/HCTZ 100/25 mg per day.  2.  Zetia 10 milligrams  per day.  3.  Zocor 20 milligrams per day.  4.  Prevacid 30 milligrams per day.  5.  Aspirin daily.  6.  Diclofenac 75 milligrams b.i.d.  7.  Nitroglycerin p.r.n.   Patient is instructed in smoking cessation, recommended low fat, low sodium  diet. Recommend follow up with Dr. Swaziland in two weeks. Discharge status is  improved.           ______________________________  Peter M. Swaziland, M.D.     PMJ/MEDQ  D:  12/05/2005  T:  12/06/2005  Job:  [id number]   cc:   Ines Bloomer, M.D.  [address]  [place]  [place] [address]   Gabriel Earing, M.D.  Fax: [phone]

## 2011-02-01 NOTE — Cardiovascular Report (Signed)
NAME:  Alfred Carney, Alfred Carney NO.:  [id number]   MEDICAL RECORD NO.:  [id number]                   PATIENT TYPE:  OIB   LOCATION:  2899                                 FACILITY:  [facility]   PHYSICIAN:  Peter M. Swaziland, M.D.               DATE OF BIRTH:  [birth year]   DATE OF PROCEDURE:  02/22/2003  DATE OF DISCHARGE:  02/22/2003                              CARDIAC CATHETERIZATION   INDICATION FOR PROCEDURE:  The patient is a 60 year old male with newly  diagnosed abdominal aortic aneurysm.  On preoperative evaluation he had an  abnormal Cardiolite study showing mild inferior and apical ischemia.  The  patient has a history of tobacco abuse, hypertension, hypercholesterolemia.   ACCESS:  Via the right femoral artery using standard Seldinger technique.  The abdominal aorta was crossed with a Wholey wire.  We did all catheter  exchanges over a long exchange wire.  There were no complications.   CONTRAST:  110 mL of Omnipaque.   MEDICATIONS:  Local anesthesia 1% Xylocaine.   HEMODYNAMIC DATA:  1. Aortic pressure was 151/94 with a mean of 117.  2. Left ventricular pressure was 173 with EDP of 17 mmHg.  3. By simultaneous recording there was no significant aortic valve gradient.   ANGIOGRAPHIC DATA:  1. The left coronary artery arises normally and has an inferior course.     Left main coronary is long without significant disease.  2. Left anterior descending artery has a diffusely small distal vessel     without focal stenosis.  3. The left circumflex coronary artery is without significant disease.     Again, the distal vessel appears small in caliber.  4. The right coronary artery is a codominant vessel.  There is a 50-60%     stenosis in the mid vessel.  This is prior to trifurcation of the vessel.   LEFT VENTRICULOGRAPHY:  Left ventricular angiography performed in the RAO  view demonstrates normal left ventricular size and contractility with normal  systolic function.  Ejection fraction is estimated at 60%.  There is no  mitral regurgitation or prolapse.   FINAL INTERPRETATION:  1. Borderline obstructive coronary disease in the mid right coronary artery.  2. Normal left ventricular function.    PLAN:  Would recommend patient proceed with repair of his abdominal aortic  aneurysm.  Recommend risk factor modification.                                               Peter M. Swaziland, M.D.    PMJ/MEDQ  D:  02/22/2003  T:  02/23/2003  Job:  [id number]   cc:   Balinda Quails, M.D.  [address]  [place]  [place] [address]  Fax: [phone]   Di Kindle.  Edilia Bo, M.D.  [address]  [place]  [place] [address]  Fax: [phone]

## 2011-02-01 NOTE — Op Note (Signed)
NAME:  RAJVIR, ERNSTER NO.:  [id number]   MEDICAL RECORD NO.:  [id number]                   PATIENT TYPE:  INP   LOCATION:  2303                                 FACILITY:  [facility]   PHYSICIAN:  Di Kindle. Edilia Bo, M.D.        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   POSTOPERATIVE DIAGNOSIS:  Type 4 thoracoabdominal aneurysm 8.0 cm.   PROCEDURE:  1. Repair of type 4 thoracoabdominal aneurysm via a thoracoabdominal  approach.  1. Aorta-right femoral-left external iliac artery bypass graft.  2. Implantation of two right renal arteries and one left renal artery.   SURGEON:  Di Kindle. Edilia Bo, M.D.   COSURGEONBalinda Quails, M.D.   NOTATION:  Dr. Madilyn Fireman has dictated the thoracotomy component of the  procedure, and also the closure of the thoracotomy component, and closure of  the diaphragm.   INDICATIONS FOR PROCEDURE:  This is a 60 year old gentleman who during a  workup for hematuria was found to have an 8.0 cm suprarenal abdominal aortic  aneurysm.  This appeared to extend up to the posterior level of the celiac  axis.  Given the extent of the aneurysm, it was felt that the only real  option for repair was a thoracoabdominal approach.  He was seen in  consultation by Dr. Madilyn Fireman, and we agreed that this was the best approach.  Given the size of the aneurysm with a 20% per year risk of rupture. We  proceed with an elective repair after a preoperative workup by Dr. Peter M.  Swaziland.   DESCRIPTION OF PROCEDURE:  The patient was taken to the operating room after  a Swan-Ganz catheter and arterial line were placed by anesthesia.  He  received a general anesthetic; then a lumbar drain was placed.  The patient  was then carefully positioned with the shoulders rolled to the left.  Then a  hockey stick incision was made through the seventh interspace, extending  down the abdominal midline.  Again the thoracotomy component is dictated  separately.  The abdomen was entered through a midline incision.  The  lateral reflection of the colon was divided, and the left colon, spleen,  pancreas and left kidney were all retracted to the right, thus exposing the  suprarenal aorta.  The supraceliac aorta was identified and controlled with  a loop, and then the dissection carried down distally carefully, and the  celiac axis was identified and controlled with a blue Vesi-loop.  The  superior mesenteric artery was controlled with a blue Vesi-loop.  The left  renal artery was controlled with a blue Vesi-loop.  Next, the dissection was carried inferiorly where the left common iliac  artery was identified.  It had significant plaque.  We then placed an  umbilical tape around this, to be ligated later.  The external iliac artery  was then dissected free, and the plan was for bypass of the external iliac  artery, to  preserve retrograde flow into the hypogastric artery.  On the  right side, there was plaque proximally.  The abdominal contents were  returned to their normal position, and the conventional exposure of the  aorta was performed.  The external iliac artery was quite small, and  therefore we could not go to the iliac on the right side.  Through the  anterior approach the major right renal artery which came off somewhat the  anterior aspect of the aneurysm anteriorly, was identified and controlled  with a blue Vesi-loop.  The patient was then heparinized and received  Mannitol.  The abdominal contents were then reflected again to the right.  A  clamp after both common iliac arteries were ligated with umbilical tapes.  The clamp was placed above the celiac axis, and the celiac  axis, superior  mesenteric artery and left renal artery were all controlled with Vesi-loops.  The aorta was  then entered posteriorly.  A Correll patch was fashioned  around the orifice of the left renal artery.  Next, a 20 x 10 graft was  angulated and then sewn end-to-end to the aorta with the posterior aspect  extending up to the level of the celiac axis.  Within this anastomosis we  were able to incorporate the most-superior right renal artery which was  somewhat small, the superior mesenteric artery and the celiac axis.  Next, the left renal artery anastomosis was performed.  Of note, we had  tested the proximal anastomosis which was hemostatic, and then the clamp was  reapplied to allow reimplantation of the left renal artery.  The appropriate  spot on the graft was selected.  A graftotomy was made and then a 5 mm punch  was used.  The artery was mobilized over and sewn end-to-side to the graft  on the lateral aspect on the left, with a continuous #5-0 Prolene suture.  Next, attention was turned to reimplantation of the right renal artery.  Again a Correll patch was fashioned.  A graftotomy was made and a 5 mm punch  was used.  The right renal artery was reimplanted on the right side of the  graft with #5-0 Prolene suture.  Next, the right limb of the bifurcated graft was pulled down through the  previously-created tunnel for an anastomosis to the common femoral artery  which had been dissected free through an oblique incision in the right  groin.  The distal common femoral artery was controlled with a blue Vesi-  loop, and the proximal artery clamped.  A longitudinal arteriotomy was made.  The graft was then pulled to the appropriate length and spatulated for an  anastomosis to the common femoral artery.  This anastomosis was done end-to-  side with a continuous #5-0 Prolene suture.  Next, the left limb of the graft was brought through the tunnel for an  anastomosis to the external iliac artery.  The external iliac artery was clamped proximally and distally, and a longitudinal arteriotomy  was made.  The graft was cut to the appropriate length, spatulated, and sewn end-to-  side to the external iliac artery using continuous #6-0 Prolene suture.  Next, the anastomoses were all inspected, and good hemostasis was obtained.  There was some slight kinking of the anastomosis of the graft to the left  renal artery.  Therefore this was fully mobilized, and after this there was  a nice lie of this reimplanted left renal artery.  There was good Doppler  flow in the celiacs, SMA, and both renal artery reimplants.  The heparin  which had been given was reversed with Protamine.  The closure of the diaphragm and chest are dictated by Dr. Madilyn Fireman.  The  abdominal wound was closed proximally.  The posterior fascia was closed with  running #1 PDS suture.  The anterior fascial layer was closed with two  running #1 PDS sutures, after the abdominal contents were returned to their  normal position.  The subcutaneous tissue was  closed with running #2-0 Vicryl, and the skin was closed with staples.  A  sterile dressing was applied.  The patient tolerated this procedure well and was transferred to the  intensive care unit in critical condition.  All needle and sponge counts  were correct.                                                   Di Kindle. Edilia Bo, M.D.    CSD/MEDQ  D:  03/15/2003  T:  03/15/2003  Job:  [id number]   cc:   Peter M. Swaziland, M.D.  [address], [address]  [place], [place] [address]  Fax: [phone]   Ihor Gully, M.D.

## 2011-02-01 NOTE — Op Note (Signed)
NAME:  Alfred Carney, Alfred Carney NO.:  [id number]   MEDICAL RECORD NO.:  [id number]                   PATIENT TYPE:  OIB   LOCATION:  NA                                   FACILITY:  [facility]   PHYSICIAN:  Di Kindle. Edilia Bo, M.D.        DATE OF BIRTH:  June 04, 1951   DATE OF PROCEDURE:  02/11/2003  DATE OF DISCHARGE:                                 OPERATIVE REPORT   OPERATION PERFORMED:  1. Aortogram.  2. Bilateral iliac arteriogram.  3. Bilateral lower extremity runoff.   SURGEON:  Di Kindle. Edilia Bo, M.D.   ASSISTANT:  Nurse.   INDICATIONS FOR PROCEDURE:  The patient is a 60 year old gentleman who had  microhematuria.  This prompted a CAT scan.  An incidental finding was an 8  cm juxtarenal abdominal aortic aneurysm.  He was brought in for diagnostic  arteriography in order to plan elective repair.  The procedure and potential  complications of arteriography including but not limited to bleeding,  arterial injury, dye reaction, and kidney failure were discussed with the  patient preoperatively and all of his questions were answered.  He was  agreeable to proceed.   DESCRIPTION OF PROCEDURE:  The patient was taken to PV lab at [facility] and  sedated with 1 mg of Versed and 50 mcg of fentanyl.  Both groins were  prepped and draped in the usual sterile fashion.  After the skin was  infiltrated with 1% lidocaine, the right common femoral artery was  cannulated and a guidewire introduced into the right iliac artery.  I was  unable to pass the J-wire through the distal aorta and had to use an angled  Glidewire to get the wire up above the renal arteries.  A 5 French sheath  was then passed over the wire and then a pigtail catheter was positioned at  the L1 vertebral body.  Flush aortogram was obtained.  Oblique projections  were obtained of the renals and a lateral projection was obtained.  The  catheter was then repositioned above the aortic bifurcation and  oblique  iliac projections were obtained.  The bilateral lower extremity runoff films  were then obtained.   FINDINGS:  The patient has two renal arteries on the right with a small  superior accessory renal artery and a dominant inferior renal artery which  appears to come off the anterior aspect of this suprarenal aneurysm.  The  aneurysm appears to extend up to the level of the superior mesenteric  artery.  On the left side there is a single  renal artery and the aneurysm  extends right up to the level of the left renal artery.  The wall of the  aneurysm is calcific and there is a significant amount of laminated thrombus  within this aneurysm which was 8 cm  by CAT scan.  There was a very  eccentric calcific plaque in the distal aorta.  The aneurysm  tapers down and  ends above the bifurcation.  Both common iliac and external iliac arteries  and hypogastric arteries were patent bilaterally with no focal stenosis  identified.  On the lateral projections, this aneurysm appears to extend up  to the level of the superior mesentery artery.   Bilateral lower extremity runoff films demonstrate patent common femoral,  superficial femoral and deep femoral arteries bilaterally.  There is a  probably 40% focal stenosis in the distal right common femoral artery.  Popliteal arteries are patent and there is a high origin of the anterior  tibial arteries bilaterally.  The proximal tibial vessels were patent but  there was poor visualization distally.  The anterior tibial arteries appear  to be the dominant run off vessels bilaterally.    CONCLUSION:  1. Suprarenal abdominal aortic aneurysm which appears to extend up to the     level of the superior mesenteric artery on the lateral projection.  2. Eccentric calcific plaque in the distal infrarenal aorta where the     aneurysm tapers down above the bifurcation.  3. A 40% stenosis of the distal right common femoral artery.                                                Di Kindle. Edilia Bo, M.D.    CSD/MEDQ  D:  02/11/2003  T:  02/11/2003  Job:  [id number]   cc:   Veverly Fells. Vernie Ammons, M.D.  [address]  [place]  [place] [address]  Fax: [phone]   Gabriel Earing, M.D.  [address]  [place]  [place] [address]  Fax: [phone]   [facility]

## 2011-02-01 NOTE — Op Note (Signed)
NAME:  Alfred Carney, Alfred Carney NO.:  [id number]   MEDICAL RECORD NO.:  [id number]                   PATIENT TYPE:  INP   LOCATION:  2303                                 FACILITY:  [facility]   PHYSICIAN:  Balinda Quails, M.D.                 DATE OF BIRTH:  May 06, 1951   DATE OF PROCEDURE:  03/15/2003  DATE OF DISCHARGE:                                 OPERATIVE REPORT   SURGEON:  Co-surgeons P. Liliane Bade, M.D. and Di Kindle. Edilia Bo, M.D.   ANESTHESIA:  General anesthesia, anesthesiologist Kaylyn Layer. Michelle Piper, M.D.   PREOPERATIVE DIAGNOSIS:  Type 4 thoracoabdominal aortic aneurysm.   POSTOPERATIVE DIAGNOSIS:  Type 4 thoracoabdominal aortic aneurysm.   PROCEDURE:  Left thoracotomy for exposure of type 4 thoracoabdominal aortic  aneurysm.   INDICATIONS FOR PROCEDURE:  This is a 60 year old male with a large type 4  thoracoabdominal aortic aneurysm measuring 8 cm in maximal diameter. He has  been seen and evaluated by Dr. Waverly Ferrari including a CT scan and  arteriography. He was seen by myself preoperatively and evaluated for  operative repair.   He has a large abdominal aortic aneurysm which extends up to the level  of  the superior mesenteric artery. This extends down to the aortic bifurcation  where there is extensive  plaque at the bifurcation. The patient was seen  and evaluated preoperatively and the risks and benefits of the operative  procedure were explained to the patient in detail. The major morbidity and  mortality of this procedure is 10% to 20%, which includes but is not limited  to MI, CVA, renal failure with dialysis, pulmonary failure with ventilation,  infection, bleeding, limb loss, spinal cord ischemia with paraplegia and  death. The patient consented for surgery.   DESCRIPTION OF PROCEDURE:  The details of the type 4 thoracoabdominal aortic  aneurysm repair are dictated under a separate heading by Dr. Edilia Bo. This  dictation will  describe the left thoracotomy for exposure.   The patient was placed under general endotracheal anesthesia with a double-  lumen endotracheal tube. A Foley catheter, arterial line and Swann-Ganz  catheter were then placed. A CSF drain was in place. Adjuvant measures for  spinal cord ischemia were undertaken including  barbiturate administration,  high-dose corticosteroids and Narcan drip. He was placed in the modified  left thoracoabdominal position.   A skin incision was extended from the abdomen across the costal margin, the  left costal margin and  the left 6th intercostal space. The subcutaneous  tissue  was divided. The serratus anterior muscle was divided. The 6th  intercostal space was entered. The lung was free. The costal margin was  divided with electrocautery and a bone cutter. The diaphragm was then taken  down radially for a short distance. This allowed full exposure for  dissection of the aneurysm.   The crural fibers of the  diaphragm were taken down. The aorta was exposed at  the diaphragm and encircled with an umbilical tape.   Under a separate Dr. Edilia Bo will dictate the details of the aneurysm  repair. Following  aneurysm  repair, the left chest was drained with a #28  angled chest tube over the diaphragm and a #32 straight chest tube to the  posterior  apex of the left hemithorax.  The diaphragm was closed with interrupted figure-of-8 0 Prolene sutures.   The costal margin was reapproximated with single #6 figure-of-8 stainless  steel wire. The intercostal space was closed with #2 pericostal Vicryl  sutures. The intercostal muscle and serratus anterior were closed with  interrupted figure-of-8 0 Vicryl suture. Each subcutaneous layer was closed  with running 2-0 Vicryl suture. The subcutaneous layer was closed with  running 3-0 Vicryl suture. Staples were applied to the skin.   Chest tubes were exited to the mid axillary line in the 9th intercostal  space and  affixed to the skin with 0 silk suture. These were attached to 20  cm Pleurovac suction. Sterile dressings were applied.   At the time of transfer to the surgical intensive care unit the patient was  in stable condition, in sinus rhythm, blood pressure 150/80, urine output 50  cc per hour.                                               Balinda Quails, M.D.    PGH/MEDQ  D:  03/15/2003  T:  03/15/2003  Job:  [id number]

## 2011-02-01 NOTE — Cardiovascular Report (Signed)
NAME:  Alfred Carney, Alfred Carney NO.:  [id number]   MEDICAL RECORD NO.:  [id number]          PATIENT TYPE:  INP   LOCATION:  3707                         FACILITY:  [facility]   PHYSICIAN:  Peter M. Swaziland, M.D.  DATE OF BIRTH:  11-07-1950   DATE OF PROCEDURE:  DATE OF DISCHARGE:                              CARDIAC CATHETERIZATION   INDICATIONS FOR PROCEDURE:  [name] is a 60 year old white male with  known history of vascular disease. He is status post thoracoabdominal  aneurysm repair. He has a history of nonobstructive coronary disease by  cardiac catheterization in 2004. He presents with symptoms consistent with  unstable angina.   PROCEDURES:  Left heart catheterization, coronary left ventricular  angiography.   EQUIPMENT USED:  6-French 4 cm right and left Judkins catheter, 6-French  pigtail catheter, 6-French arterial sheath.   CONTRAST:  130 cc of Omnipaque.   MEDICATIONS:  Versed 2 milligrams IV, fentanyl 25 mcg IV.   HEMODYNAMIC DATA:  Aortic pressure 127/72 with a mean of 93, left pressure  is 129 with EDP of 18 mmHg.   ANGIOGRAPHIC DATA:  The left coronary arises superiorly in the left coronary  cusp. The left main coronary has a horizontal takeoff before branching into  the LAD and circumflex vessel. Left main coronary appears normal.   The left anterior descending artery is very tortuous and is without  significant disease. He has 2 diagonal branches which have mild disease up  to 20%, but no significant stenoses.   The left circumflex coronary appears to be a codominant vessel. It gives  rise to 1 large marginal vessel proximally, which is normal. It then  continues in the AV groove and gives off some small posterolateral branches.  There is approximately 20% narrowing in the midcircumflex.   The right coronary is a small caliber vessel, essentially gives rise to a  large right ventricular marginal branch and then continues in several small  distal branches. There is 40% disease in the proximal right coronary. In the  mid vessel, there is an acute angulated segment following the takeoff of the  right ventricular marginal branch. This segment of the right coronary  demonstrates a 60% to 70% stenosis. Again, the vessel caliber here is quite  small, estimated at 1-1/2 to 2 mm in diameter and is acutely angulated.   Left ventricular angiography was performed in the RAO view. This  demonstrates normal left ventricular size and contractility with normal  systolic function. Ejection fraction is estimated at 60%. The aortic root  appears normal in size and aortic valve appears normal.   FINAL INTERPRETATION:  1.  Single vessel coronary disease with borderline obstruction in the mid      right coronary artery.  2.  Normal left ventricular function.   PLAN:  Would recommend continued medical therapy and risk factor  modification.           ______________________________  Peter M. Sw[place]il[place], M.D.     PMJ/MEDQ  D:  11/01/2005  T:  11/01/2005  Job:  [id number]   cc:   Alfred Carney, M.D.  Fax: [phone]

## 2011-09-25 ENCOUNTER — Encounter: Payer: Self-pay | Admitting: Cardiology

## 2011-09-25 ENCOUNTER — Ambulatory Visit (INDEPENDENT_AMBULATORY_CARE_PROVIDER_SITE_OTHER): Payer: Self-pay | Admitting: Cardiology

## 2011-09-25 VITALS — BP 120/74 | HR 71 | Ht 69.0 in | Wt 324.8 lb

## 2011-09-25 DIAGNOSIS — Z8679 Personal history of other diseases of the circulatory system: Secondary | ICD-10-CM

## 2011-09-25 DIAGNOSIS — I2510 Atherosclerotic heart disease of native coronary artery without angina pectoris: Secondary | ICD-10-CM | POA: Insufficient documentation

## 2011-09-25 DIAGNOSIS — E785 Hyperlipidemia, unspecified: Secondary | ICD-10-CM | POA: Insufficient documentation

## 2011-09-25 DIAGNOSIS — I716 Thoracoabdominal aortic aneurysm, without rupture, unspecified: Secondary | ICD-10-CM

## 2011-09-25 DIAGNOSIS — I739 Peripheral vascular disease, unspecified: Secondary | ICD-10-CM | POA: Insufficient documentation

## 2011-09-25 DIAGNOSIS — I10 Essential (primary) hypertension: Secondary | ICD-10-CM | POA: Insufficient documentation

## 2011-09-25 NOTE — Progress Notes (Signed)
[name] Date of Birth: 25-Feb-1951 Medical Record #[id number]  History of Present Illness: Alfred Carney is seen today at the request of his primary care physician for cardiac evaluation. He was last seen by me about 5 years ago. He has a known history of coronary disease with cardiac catheterization in 2007 showing a 60-70% mid right coronary stenosis. This is unchanged compared to a prior study in 2004. He has a history of peripheral vascular disease and is status post repair of a thoracoabdominal aneurysm with right femoral and left iliac bypass grafting and reimplantation of his renal arteries. He denies any recent symptoms of chest pain. He does have chronic dyspnea on exertion. He is able to walk 50 feet before he becomes short of breath. He also gets short of breath going upstairs or up an incline. He is not taking statin therapy. He reports that Lipitor caused him to have palpitations in the past. He does have a history of obstructive sleep apnea but doesn't use his CPAP mask. He does note some pain in his legs bilaterally with walking with more pain in his left hip area which he relates to prior hip surgery in 1991. He did have a CT of the abdomen and 2009 for evaluation of elevated liver function studies and this demonstrated stable appearance of his aneurysm repair.  Current Outpatient Prescriptions on File Prior to Visit  Medication Sig Dispense Refill  . allopurinol (ZYLOPRIM) 300 MG tablet Take 300 mg by mouth daily.      Marland Kitchen aspirin 81 MG tablet Take 160 mg by mouth daily.      Marland Kitchen ezetimibe (ZETIA) 10 MG tablet Take 10 mg by mouth daily.      . febuxostat (ULORIC) 40 MG tablet Take 80 mg by mouth daily.      . hydrochlorothiazide (HYDRODIURIL) 25 MG tablet Take 25 mg by mouth daily.      . IBUPROFEN PO Take by mouth daily.      Marland Kitchen lisinopril (PRINIVIL,ZESTRIL) 5 MG tablet Take 5 mg by mouth daily.      . metoprolol succinate (TOPROL-XL) 25 MG 24 hr tablet Take 25 mg by mouth daily.        Marland Kitchen OMEPRAZOLE PO Take by mouth daily.      . OXYCODONE HCL PO Take by mouth as needed. 5-325 mg        No Known Allergies  Past Medical History  Diagnosis Date  . Morbid obesity   . Chronic obstructive pulmonary disease 2008    Moderate  . Community acquired pneumonia   . Hypertension   . Hyperlipidemia   . Degenerative joint disease   . Coronary artery disease   . Gastroesophageal reflux disease   . 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 Hypokalemia     diuretic induced, resolved  . Shortness of breath   . Gout   . Colon polyps     Past Surgical History  Procedure Date  . Tonsillectomy   . Cholecystectomy   . Hip surgery     Status post left hip surgery with bone grafting  . Cardiac catheterization 2007    Ejection fraction is estimated at 60%  . Thoracoabdominal aortic aneurysm repair     with right femoral and left iliac BPG and reimplantation of renal arteries.    History  Smoking status  .  Former Smoker  Smokeless tobacco  . Not on file    History  Alcohol Use No    Family History  Problem Relation Age of Onset  . Osteoarthritis Mother   . Diabetes Mother   . Heart disease Father     Coronary Artery Disease  . Multiple sclerosis Sister     Review of Systems: As noted in history of present illness.  All other systems were reviewed and are negative.  Physical Exam: BP 120/74  Pulse 71  Ht 5\' 9"  (1.753 m)  Wt 324 lb 12.8 oz (147.328 kg)  BMI 47.96 kg/m2 He is an obese white male in no acute distress. He is normocephalic, atraumatic. Pupils are equal round reactive to light accommodation. Extraocular movements are full. Oropharynx is clear. Neck is thick without apparent JVD or bruits. There is no adenopathy or thyromegaly. Lungs are clear. Cardiac exam reveals a regular rate and rhythm without gallop, murmur, or click.  Abdomen is soft and nontender. He has an extensive scar extending from the midline to the left upper quadrant. There are no bruits. Extremities reveal trace edema. His posterior tibial pulses are palpable. Skin is warm and dry. He is alert and oriented x3. Cranial nerves II through XII are intact. LABORATORY DATA: ECG today demonstrates normal sinus rhythm with nonspecific T-wave abnormality.  Assessment / Plan:

## 2011-09-25 NOTE — Assessment & Plan Note (Signed)
He is currently only taking Zetia. He describes a history of intolerance to Lipitor because of palpitations. I've requested a copy of his most recent lab work to review. It may be worthwhile trying a different statin agent given his high-risk.

## 2011-09-25 NOTE — Patient Instructions (Addendum)
We will get a copy of your lab work especially your last lipid panel. We may need to consider additional cholesterol lowering therapy.  We will schedule for a nuclear stress.  We will schedule you for lower extremity arterial dopplers.  I will see you again in 1 month.

## 2011-09-25 NOTE — Assessment & Plan Note (Signed)
He has had prior right femoral and left iliac bypass grafting. He does have symptoms that are suggestive of claudication. We will arrange lower extremity arterial Doppler studies.

## 2011-09-25 NOTE — Assessment & Plan Note (Signed)
CT scan of abdomen and 2009 showed stable appearance.

## 2011-09-25 NOTE — Assessment & Plan Note (Signed)
And has been 5 years since his last coronary evaluation. At that time he had a 60-70% stenosis in the mid right coronary. He has no significant chest pain but does have chronic dyspnea which could be an anginal equivalent. We'll schedule him for a lexiscan Myoview study to further evaluate his risk.

## 2011-10-01 ENCOUNTER — Ambulatory Visit (HOSPITAL_COMMUNITY): Payer: 59 | Attending: Cardiovascular Disease | Admitting: Radiology

## 2011-10-01 VITALS — BP 142/72 | Ht 70.0 in | Wt 320.0 lb

## 2011-10-01 DIAGNOSIS — E785 Hyperlipidemia, unspecified: Secondary | ICD-10-CM

## 2011-10-01 DIAGNOSIS — I739 Peripheral vascular disease, unspecified: Secondary | ICD-10-CM | POA: Insufficient documentation

## 2011-10-01 DIAGNOSIS — R42 Dizziness and giddiness: Secondary | ICD-10-CM | POA: Insufficient documentation

## 2011-10-01 DIAGNOSIS — Z87891 Personal history of nicotine dependence: Secondary | ICD-10-CM | POA: Insufficient documentation

## 2011-10-01 DIAGNOSIS — R0609 Other forms of dyspnea: Secondary | ICD-10-CM | POA: Insufficient documentation

## 2011-10-01 DIAGNOSIS — R5383 Other fatigue: Secondary | ICD-10-CM | POA: Insufficient documentation

## 2011-10-01 DIAGNOSIS — R0989 Other specified symptoms and signs involving the circulatory and respiratory systems: Secondary | ICD-10-CM | POA: Insufficient documentation

## 2011-10-01 DIAGNOSIS — R079 Chest pain, unspecified: Secondary | ICD-10-CM

## 2011-10-01 DIAGNOSIS — R0602 Shortness of breath: Secondary | ICD-10-CM

## 2011-10-01 DIAGNOSIS — R55 Syncope and collapse: Secondary | ICD-10-CM | POA: Insufficient documentation

## 2011-10-01 DIAGNOSIS — J4489 Other specified chronic obstructive pulmonary disease: Secondary | ICD-10-CM | POA: Insufficient documentation

## 2011-10-01 DIAGNOSIS — R5381 Other malaise: Secondary | ICD-10-CM | POA: Insufficient documentation

## 2011-10-01 DIAGNOSIS — I2510 Atherosclerotic heart disease of native coronary artery without angina pectoris: Secondary | ICD-10-CM

## 2011-10-01 DIAGNOSIS — J449 Chronic obstructive pulmonary disease, unspecified: Secondary | ICD-10-CM | POA: Insufficient documentation

## 2011-10-01 DIAGNOSIS — I10 Essential (primary) hypertension: Secondary | ICD-10-CM | POA: Insufficient documentation

## 2011-10-01 MED ORDER — TECHNETIUM TC 99M TETROFOSMIN IV KIT
33.0000 | PACK | Freq: Once | INTRAVENOUS | Status: AC | PRN
  Administered 2011-10-01: 33 via INTRAVENOUS

## 2011-10-01 MED ORDER — REGADENOSON 0.4 MG/5ML IV SOLN
0.4000 mg | Freq: Once | INTRAVENOUS | Status: AC
  Administered 2011-10-01: 0.4 mg via INTRAVENOUS

## 2011-10-01 NOTE — Progress Notes (Signed)
[facility] SITE 3 NUCLEAR MED [address] [place] [address] [phone]  Cardiology Nuclear Med Study  Alfred Carney is a 61 y.o. male [id number] 1950/12/27   Nuclear Med Background Indication for Stress Test:  Evaluation for Ischemia History:  COPD and07'Heart Catheterization-mid RCA 60-70%,EF:60%;04'AAA repair,OSA Cardiac Risk Factors: Claudication, Family History - CAD, History of Smoking, Hypertension, Lipids and PVD  Symptoms:  Chest Pain with Exertion ( chronic /all over), Dizziness, DOE, Fatigue, Light-Headedness and Near Syncope   Nuclear Pre-Procedure Caffeine/Decaff Intake:  None NPO After: 8:00pm   Lungs:  CLEAR IV 0.9% NS with Angio Cath:  22g  IV Site: L Antecubital, tolerated well IV Started by:  Irean Hong, RN  Chest Size (in):  58 Cup Size: n/a  Height: 5\' 10"  (1.778 m)  Weight:  320 lb (145.151 kg)  BMI:  Body mass index is 45.92 kg/(m^2). Tech Comments:  Held Metoprolol x 24 hrs     Nuclear Med Study 1 or 2 day study: 2 day  Stress Test Type:  Lexiscan  Reading MD: Charlton Haws, MD  Order Authorizing Provider:  Peter Swaziland, MD,   Resting Radionuclide: Technetium 81m Tetrofosmin  Resting Radionuclide Dose: 33.0 mCi on 10/10/11   Stress Radionuclide:  Technetium 6m Tetrofosmin  Stress Radionuclide Dose: 33.0 mCi on 10/01/11           Stress Protocol Rest HR: 63 Stress HR: 89  Rest BP: 149/72 Stress BP: 173/71  Exercise Time (min): n/a METS: n/a   Predicted Max HR: 159 bpm % Max HR: 55.97 bpm Rate Pressure Product: 03474   Dose of Adenosine (mg):  n/a Dose of Lexiscan: n/a mg  Dose of Atropine (mg): n/a Dose of Dobutamine: n/a mcg/kg/min (at max HR)  Stress Test Technologist: Frederick Peers, EMT-P  Nuclear Technologist:  Domenic Polite, CNMT     Rest Procedure:  Myocardial perfusion imaging was performed at rest 45 minutes following the intravenous administration of Technetium 22m Tetrofosmin. Rest ECG: SR  Stress  Procedure:  The patient received IV Lexiscan 0.4 mg over 15-seconds.  Technetium 48m Tetrofosmin injected at 30-seconds.  There were no significant changes with Lexiscan.  Quantitative spect images were obtained after a 45 minute delay. Stress ECG: No significant change from baseline ECG  QPS Raw Data Images:  Patient motion noted; appropriate software correction applied. Stress Images:  Normal homogeneous uptake in all areas of the myocardium. Rest Images:  Normal homogeneous uptake in all areas of the myocardium. Subtraction (SDS):  No evidence of ischemia. Transient Ischemic Dilatation (Normal <1.22):  0.94 Lung/Heart Ratio (Normal <0.45):  0.36  Quantitative Gated Spect Images QGS EDV:  95 ml QGS ESV:  36 ml QGS cine images:  Normal Wall Motion QGS EF: 62%  Impression Exercise Capacity:  Lexiscan with no exercise. BP Response:  Normal blood pressure response. Clinical Symptoms:  Chest Pressure ECG Impression:  No significant ST segment change suggestive of ischemia. Comparison with Prior Nuclear Study: No previous nuclear study performed  Overall Impression:  Normal stress nuclear study.  Willa Rough, MD

## 2011-10-07 ENCOUNTER — Encounter: Payer: Self-pay | Admitting: *Deleted

## 2011-10-07 ENCOUNTER — Telehealth: Payer: Self-pay | Admitting: Cardiology

## 2011-10-07 ENCOUNTER — Other Ambulatory Visit: Payer: Self-pay

## 2011-10-07 ENCOUNTER — Encounter (HOSPITAL_COMMUNITY): Payer: Self-pay | Admitting: Radiology

## 2011-10-07 DIAGNOSIS — I739 Peripheral vascular disease, unspecified: Secondary | ICD-10-CM

## 2011-10-07 NOTE — Telephone Encounter (Signed)
Patient called,states has been having burning pain in both feet,ankles,lower legs ever since lexiscan last tue.10/01/11.States he thought he had gout and started taking allopurinol with no relief.States ibuprofen helps some.Spoke with Dr.Jordan lexiscan not related to pain.Advised needs lower ext.dopplers.Patient cancelled lower ext dopplers that was scheduled for today 10/07/11.Patient will reschedule dopplers.Fowarded to schedulers to reschedule lower ext dopplers.

## 2011-10-07 NOTE — Telephone Encounter (Signed)
Pt had stress test on Tuesday, symptoms started Wednesday night of pain in ankles and feet, like as before with gout, so started gout med that night, but pain gets worse each day and radiating up to his knee caps, now can't get out of bed pain so bad, wife wonders if related to injection from test, pls advise

## 2011-10-08 ENCOUNTER — Telehealth: Payer: Self-pay | Admitting: Cardiology

## 2011-10-10 ENCOUNTER — Encounter (INDEPENDENT_AMBULATORY_CARE_PROVIDER_SITE_OTHER): Payer: 59 | Admitting: Cardiology

## 2011-10-10 ENCOUNTER — Ambulatory Visit (HOSPITAL_COMMUNITY): Payer: 59 | Attending: Cardiology | Admitting: Radiology

## 2011-10-10 DIAGNOSIS — I739 Peripheral vascular disease, unspecified: Secondary | ICD-10-CM

## 2011-10-10 DIAGNOSIS — R0989 Other specified symptoms and signs involving the circulatory and respiratory systems: Secondary | ICD-10-CM

## 2011-10-10 MED ORDER — TECHNETIUM TC 99M TETROFOSMIN IV KIT
33.0000 | PACK | Freq: Once | INTRAVENOUS | Status: AC | PRN
  Administered 2011-10-10: 33 via INTRAVENOUS

## 2011-10-11 ENCOUNTER — Other Ambulatory Visit: Payer: Self-pay

## 2011-10-11 DIAGNOSIS — E785 Hyperlipidemia, unspecified: Secondary | ICD-10-CM

## 2011-10-15 ENCOUNTER — Other Ambulatory Visit (INDEPENDENT_AMBULATORY_CARE_PROVIDER_SITE_OTHER): Payer: 59 | Admitting: *Deleted

## 2011-10-15 DIAGNOSIS — E785 Hyperlipidemia, unspecified: Secondary | ICD-10-CM

## 2011-10-15 LAB — LIPID PANEL
Cholesterol: 167 mg/dL (ref 0–200)
HDL: 41 mg/dL (ref >39.00)
LDL Cholesterol: 104 mg/dL — ABNORMAL HIGH (ref 0–99)
Total CHOL/HDL Ratio: 4
Triglycerides: 112 mg/dL (ref 0.0–149.0)
VLDL: 22.4 mg/dL (ref 0.0–40.0)

## 2011-10-16 ENCOUNTER — Other Ambulatory Visit: Payer: Self-pay

## 2011-10-16 DIAGNOSIS — E785 Hyperlipidemia, unspecified: Secondary | ICD-10-CM

## 2011-10-16 MED ORDER — ROSUVASTATIN CALCIUM 10 MG PO TABS: 10.0000 mg | ORAL_TABLET | Freq: Every day | ORAL | Status: DC

## 2011-11-07 ENCOUNTER — Ambulatory Visit (INDEPENDENT_AMBULATORY_CARE_PROVIDER_SITE_OTHER): Payer: 59 | Admitting: Cardiology

## 2011-11-07 ENCOUNTER — Encounter: Payer: Self-pay | Admitting: Cardiology

## 2011-11-07 VITALS — BP 130/80 | HR 72 | Ht 69.0 in | Wt 311.6 lb

## 2011-11-07 DIAGNOSIS — R0609 Other forms of dyspnea: Secondary | ICD-10-CM

## 2011-11-07 DIAGNOSIS — I739 Peripheral vascular disease, unspecified: Secondary | ICD-10-CM

## 2011-11-07 DIAGNOSIS — I2510 Atherosclerotic heart disease of native coronary artery without angina pectoris: Secondary | ICD-10-CM

## 2011-11-07 DIAGNOSIS — I779 Disorder of arteries and arterioles, unspecified: Secondary | ICD-10-CM

## 2011-11-07 DIAGNOSIS — E785 Hyperlipidemia, unspecified: Secondary | ICD-10-CM

## 2011-11-07 DIAGNOSIS — R0989 Other specified symptoms and signs involving the circulatory and respiratory systems: Secondary | ICD-10-CM

## 2011-11-07 DIAGNOSIS — R0600 Dyspnea, unspecified: Secondary | ICD-10-CM

## 2011-11-07 DIAGNOSIS — E7800 Pure hypercholesterolemia, unspecified: Secondary | ICD-10-CM

## 2011-11-07 NOTE — Patient Instructions (Signed)
Continue your current therapy.  We will get fasting lab work on you in April.  Consider seeing a neurosurgeon or orthopedist to evaluate your spine.  I will see you again in 6 months

## 2011-11-07 NOTE — Assessment & Plan Note (Signed)
His last LDL was 104. He is now on a combination of Crestor and Zetia and appears to be tolerating this well. We haven't scheduled for followup lab work at the end of April.

## 2011-11-07 NOTE — Assessment & Plan Note (Signed)
His lower chimney Doppler studies demonstrated excellent perfusion. I suspect that his leg discomfort is more neuropathic. Have recommended further evaluation of his lumbar spine to rule out spinal stenosis. I suggested that he follow up with orthopedics or neurosurgery. He states he is going to think about it. If he does not have significant spinal stenosis and medication for neuropathy would be indicated.

## 2011-11-07 NOTE — Assessment & Plan Note (Signed)
His Myoview study was normal. We will continue with risk factor modification. I am encouraged with his weight loss. I'll followup again in 6 months.

## 2011-11-07 NOTE — Progress Notes (Signed)
Alfred Carney Date of Birth: 02/11/1951 Medical Record #[id number]  History of Present Illness: Alfred Carney is seen today for followup. He continues to have dyspnea on exertion. He denies any significant chest pain. He continues to complain of bilateral leg pain with exertion. He also notes numbness in his legs and after his stress test had significant burning in his feet for several days. This is now resolved. He is on Crestor 10 mg a day and seems to be tolerating this well.  Current Outpatient Prescriptions on File Prior to Visit  Medication Sig Dispense Refill  . allopurinol (ZYLOPRIM) 300 MG tablet Take 300 mg by mouth daily.      Marland Kitchen aspirin 81 MG tablet Take 81 mg by mouth daily.       Marland Kitchen ezetimibe (ZETIA) 10 MG tablet Take 10 mg by mouth daily.      . hydrochlorothiazide (HYDRODIURIL) 25 MG tablet Take 25 mg by mouth daily.      . IBUPROFEN PO Take by mouth daily.      Marland Kitchen lisinopril (PRINIVIL,ZESTRIL) 5 MG tablet Take 5 mg by mouth daily.      . metoprolol succinate (TOPROL-XL) 25 MG 24 hr tablet Take 25 mg by mouth daily.      Marland Kitchen OMEPRAZOLE PO Take by mouth daily.      . OXYCODONE HCL PO Take by mouth as needed. 5-325 mg      . rosuvastatin (CRESTOR) 10 MG tablet Take 1 tablet (10 mg total) by mouth at bedtime.  30 tablet  11    No Known Allergies  Past Medical History  Diagnosis Date  . Morbid obesity   . Chronic obstructive pulmonary disease 2008    Moderate  . Community acquired pneumonia   . Hypertension   . Hyperlipidemia   . Degenerative joint disease   . Coronary artery disease   . Gastroesophageal reflux disease   . 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 Hypokalemia     diuretic induced, resolved  . Shortness of breath   . Gout   . Colon polyps     Past Surgical History  Procedure Date  . Tonsillectomy   . Cholecystectomy   . Hip  surgery     Status post left hip surgery with bone grafting  . Cardiac catheterization 2007    Ejection fraction is estimated at 60%  . Thoracoabdominal aortic aneurysm repair     with right femoral and left iliac BPG and reimplantation of renal arteries.    History  Smoking status  . Former Smoker  Smokeless tobacco  . Not on file    History  Alcohol Use No    Family History  Problem Relation Age of Onset  . Osteoarthritis Mother   . Diabetes Mother   . Heart disease Father     Coronary Artery Disease  . Multiple sclerosis Sister     Review of Systems: As noted in history of present illness.  He has lost 13 pounds. All other systems were reviewed and are negative.  Physical Exam: BP 130/80  Pulse 72  Ht 5\' 9"  (1.753 m)  Wt 141.341 kg (311 lb 9.6 oz)  BMI 46.02 kg/m2 He is an obese white male in no acute distress. He is normocephalic, atraumatic. Pupils are equal round reactive to light accommodation. Extraocular movements are full. Oropharynx  is clear. Neck is thick without apparent JVD or bruits. There is no adenopathy or thyromegaly. Lungs are clear. Cardiac exam reveals a regular rate and rhythm without gallop, murmur, or click. Abdomen is soft and nontender. He has an extensive scar extending from the midline to the left upper quadrant. There are no bruits. Extremities reveal trace edema. His posterior tibial pulses are palpable. Skin is warm and dry. He is alert and oriented x3. Cranial nerves II through XII are intact. LABORATORY DATA: Lower extremity arterial Doppler studies demonstrated excellent perfusion with an ankle-brachial indices of 0.97 on the right 0.99 on the left. His Myoview study showed normal perfusion and normal ejection fraction.  Assessment / Plan:

## 2012-01-14 ENCOUNTER — Other Ambulatory Visit (INDEPENDENT_AMBULATORY_CARE_PROVIDER_SITE_OTHER): Payer: BC Managed Care – HMO

## 2012-01-14 DIAGNOSIS — E785 Hyperlipidemia, unspecified: Secondary | ICD-10-CM

## 2012-01-14 LAB — HEPATIC FUNCTION PANEL
ALT: 24 U/L (ref 0–53)
AST: 23 U/L (ref 0–37)
Albumin: 3.7 g/dL (ref 3.5–5.2)
Alkaline Phosphatase: 72 U/L (ref 39–117)
Bilirubin, Direct: 0.1 mg/dL (ref 0.0–0.3)
Total Bilirubin: 0.7 mg/dL (ref 0.3–1.2)
Total Protein: 6.9 g/dL (ref 6.0–8.3)

## 2012-01-14 LAB — LIPID PANEL
Cholesterol: 82 mg/dL (ref 0–200)
HDL: 36.4 mg/dL — ABNORMAL LOW (ref >39.00)
LDL Cholesterol: 25 mg/dL (ref 0–99)
Total CHOL/HDL Ratio: 2
Triglycerides: 102 mg/dL (ref 0.0–149.0)
VLDL: 20.4 mg/dL (ref 0.0–40.0)

## 2012-05-15 ENCOUNTER — Other Ambulatory Visit: Payer: Self-pay

## 2012-05-15 MED ORDER — ROSUVASTATIN CALCIUM 10 MG PO TABS: 10.0000 mg | ORAL_TABLET | Freq: Every day | ORAL | Status: DC

## 2013-05-19 ENCOUNTER — Other Ambulatory Visit (HOSPITAL_COMMUNITY): Payer: Self-pay | Admitting: Orthopedic Surgery

## 2013-05-19 NOTE — Pre-Procedure Instructions (Signed)
Alfred Carney  05/19/2013   Your procedure is scheduled on:  Wednesday, September 10th  Report to [facility] at 0630 AM.  Call this number if you have problems the morning of surgery: [phone]   Remember:   Do not eat food or drink liquids after midnight.   Take these medicines the morning of surgery with A SIP OF WATER: Toprol, Omeprazole   Do not wear jewelry.  Do not wear lotions, powders, or perfumes. You may wear deodorant.  Do not shave 48 hours prior to surgery. Men may shave face and neck.  Do not bring valuables to the hospital.  [facility] is not responsible  for any belongings or valuables.  Contacts, dentures or bridgework may not be worn into surgery.  Leave suitcase in the car. After surgery it may be brought to your room.  For patients admitted to the hospital, checkout time is 11:00 AM the day of discharge.   Special Instructions: Shower using CHG 2 nights before surgery and the night before surgery.  If you shower the day of surgery use CHG.  Use special wash - you have one bottle of CHG for all showers.  You should use approximately 1/3 of the bottle for each shower.   Please read over the following fact sheets that you were given: Pain Booklet, Coughing and Deep Breathing, Blood Transfusion Information, MRSA Information and Surgical Site Infection Prevention

## 2013-05-20 ENCOUNTER — Encounter (HOSPITAL_COMMUNITY)
Admission: RE | Admit: 2013-05-20 | Discharge: 2013-05-20 | Disposition: A | Discharge status: Home / Self Care | Payer: BC Managed Care – PPO | Source: Ambulatory Visit | Attending: Orthopedic Surgery | Admitting: Orthopedic Surgery

## 2013-05-20 ENCOUNTER — Encounter (HOSPITAL_COMMUNITY): Payer: Self-pay

## 2013-05-20 DIAGNOSIS — Z01810 Encounter for preprocedural cardiovascular examination: Secondary | ICD-10-CM | POA: Insufficient documentation

## 2013-05-20 DIAGNOSIS — Z01818 Encounter for other preprocedural examination: Secondary | ICD-10-CM | POA: Insufficient documentation

## 2013-05-20 DIAGNOSIS — Z01812 Encounter for preprocedural laboratory examination: Secondary | ICD-10-CM | POA: Insufficient documentation

## 2013-05-20 HISTORY — DX: Sleep apnea, unspecified: G47.30

## 2013-05-20 HISTORY — DX: Personal history of other diseases of the digestive system: Z87.19

## 2013-05-20 HISTORY — DX: Hepatomegaly, not elsewhere classified: R16.0

## 2013-05-20 LAB — URINALYSIS, ROUTINE W REFLEX MICROSCOPIC
Glucose, UA: NEGATIVE mg/dL
Hgb urine dipstick: NEGATIVE
Ketones, ur: NEGATIVE mg/dL
Leukocytes, UA: NEGATIVE
Nitrite: NEGATIVE
Protein, ur: NEGATIVE mg/dL
Specific Gravity, Urine: 1.024 (ref 1.005–1.030)
Urobilinogen, UA: 1 mg/dL (ref 0.0–1.0)
pH: 5.5 (ref 5.0–8.0)

## 2013-05-20 LAB — TYPE AND SCREEN
ABO/RH(D): B POS
Antibody Screen: NEGATIVE

## 2013-05-20 LAB — CBC
HCT: 46.2 % (ref 39.0–52.0)
Hemoglobin: 15.9 g/dL (ref 13.0–17.0)
MCH: 29.1 pg (ref 26.0–34.0)
MCHC: 34.4 g/dL (ref 30.0–36.0)
MCV: 84.6 fL (ref 78.0–100.0)
Platelets: 177 10*3/uL (ref 150–400)
RBC: 5.46 MIL/uL (ref 4.22–5.81)
RDW: 15.2 % (ref 11.5–15.5)
WBC: 8.5 10*3/uL (ref 4.0–10.5)

## 2013-05-20 LAB — ABO/RH: ABO/RH(D): B POS

## 2013-05-20 LAB — COMPREHENSIVE METABOLIC PANEL
ALT: 37 U/L (ref 0–53)
AST: 23 U/L (ref 0–37)
Albumin: 3.9 g/dL (ref 3.5–5.2)
Alkaline Phosphatase: 78 U/L (ref 39–117)
BUN: 24 mg/dL — ABNORMAL HIGH (ref 6–23)
CO2: 23 mEq/L (ref 19–32)
Calcium: 9.5 mg/dL (ref 8.4–10.5)
Chloride: 103 mEq/L (ref 96–112)
Creatinine, Ser: 1.24 mg/dL (ref 0.50–1.35)
GFR calc Af Amer: 70 mL/min — ABNORMAL LOW (ref >90)
GFR calc non Af Amer: 60 mL/min — ABNORMAL LOW (ref >90)
Glucose, Bld: 110 mg/dL — ABNORMAL HIGH (ref 70–99)
Potassium: 3.9 mEq/L (ref 3.5–5.1)
Sodium: 140 mEq/L (ref 135–145)
Total Bilirubin: 0.4 mg/dL (ref 0.3–1.2)
Total Protein: 7.5 g/dL (ref 6.0–8.3)

## 2013-05-20 LAB — SURGICAL PCR SCREEN
MRSA, PCR: NEGATIVE
Staphylococcus aureus: NEGATIVE

## 2013-05-20 LAB — APTT: aPTT: 34 seconds (ref 24–37)

## 2013-05-20 LAB — PROTIME-INR
INR: 1 (ref 0.00–1.49)
Prothrombin Time: 13 seconds (ref 11.6–15.2)

## 2013-05-20 NOTE — Progress Notes (Signed)
Anesthesia Chart Review:  Patient is a 62 year old male scheduled for left total hip arthroplasty, removal of deep hardware on 05/26/13 by Dr. Lajoyce Corners.  History includes morbid obesity, former smoker since 2008, COPD, OSA without CPAP compliance, thoracoabdominal aortic aneurysm s/p open repair (aorta to right femoral and left iliac artery BPG with reimplantation of renal arteries) '04, HTN, HLD, GERD, hiatal hernia, hepatomegaly/fatty liver (by '09 CT), skin cancer, DJD, possible [facility] Spotted Fever > 10 years ago, left hip surgery with bone graft (free fibula) in the '90's.  Dr. Lajoyce Corners had ordered an anesthesia consult, but staff unknowing let him leave following labs and CXR--and patient forgot that he was asked to wait to speak with me. PCP is Dr. Devra Dopp with [facility] who referred patient to Dr. Lajoyce Corners.  Since I was unable to evaluate patient during his PAT, I did call and speak with him.  He denies chest pain or SOB at rest.  He has chronic DOE with activities such as bringing in the groceries.  He feels this is currently stable, but is discouraged that he continues to gain weight because he cannot exercise due to hip pain.  He is not followed by a pulmonologist and does use inhalers.  He denies any recent URI and his last PNA was several years ago.  He stopped using CPAP some time ago because he felt it interfered with his sleep (was only sleeping 2-3 hours)--particularly with worsening hip pain.   EKG on 05/20/13 showed SB  @ 56 bpm, cannot rule out inferior infarct (age undetermined), non-specific T wave abnormality.  Overall, I think his EKG is stable since 09/25/11.  Nuclear stress test on 10/11/11 was normal, EF 62%.  Cardiac cath on 11/01/05 showed normal left main, LAD tortuous but without significant disease, two diagonal branches with up to 20% stenosis, large normal OM1, 20% mid CX stenosis.The right coronary is a small caliber vessel, essentially gives rise to a large  right ventricular marginal branch and then continues in several small distal branches. There is 40% disease in the proximal right coronary. In the  mid vessel, there is an acute angulated segment following the takeoff of the right ventricular marginal branch. This segment of the right coronary demonstrates a 60% to 70% stenosis. Again, the vessel caliber here is quite small, estimated at 1-1/2 to 2 mm in diameter and is acutely angulated.  Normal LV function, EF 60%.  Medical therapy recommended.  ABI's were WNL on 10/10/2011.  CXR on 05/20/13 showed chronic changes in the left base (scarring).  No acuate abnormality is noted.  Preoperative labs noted.  He denies chest pain and has had a normal nuclear stress test within the past two years.  He has OSA and may require RT for CPAP or BiPAP post-operatively--especially with underlying morbid obesity.  He will be further evaluated by his assigned anesthesiologist on the day of surgery.  Velna Ochs [facility]/Anesthesiology Phone [phone] 05/20/2013 4:37 PM

## 2013-05-25 MED ORDER — DEXTROSE 5 % IV SOLN
3.0000 g | INTRAVENOUS | Status: AC
  Administered 2013-05-26: 3 g via INTRAVENOUS
  Filled 2013-05-25: qty 3000

## 2013-05-26 ENCOUNTER — Inpatient Hospital Stay (HOSPITAL_COMMUNITY): Payer: BC Managed Care – PPO | Admitting: Anesthesiology

## 2013-05-26 ENCOUNTER — Inpatient Hospital Stay (HOSPITAL_COMMUNITY): Payer: BC Managed Care – PPO

## 2013-05-26 ENCOUNTER — Inpatient Hospital Stay (HOSPITAL_COMMUNITY)
Admission: RE | Admit: 2013-05-26 | Discharge: 2013-05-29 | DRG: 817 | Disposition: A | Discharge status: Home / Self Care | Payer: BC Managed Care – PPO | Source: Ambulatory Visit | Attending: Orthopedic Surgery | Admitting: Orthopedic Surgery

## 2013-05-26 ENCOUNTER — Encounter (HOSPITAL_COMMUNITY)
Admission: RE | Disposition: A | Discharge status: Home / Self Care | Payer: Self-pay | Source: Ambulatory Visit | Attending: Orthopedic Surgery

## 2013-05-26 ENCOUNTER — Encounter (HOSPITAL_COMMUNITY): Payer: Self-pay | Admitting: Vascular Surgery

## 2013-05-26 ENCOUNTER — Encounter (HOSPITAL_COMMUNITY): Payer: Self-pay | Admitting: *Deleted

## 2013-05-26 DIAGNOSIS — I739 Peripheral vascular disease, unspecified: Secondary | ICD-10-CM | POA: Diagnosis present

## 2013-05-26 DIAGNOSIS — Z79899 Other long term (current) drug therapy: Secondary | ICD-10-CM

## 2013-05-26 DIAGNOSIS — Z7982 Long term (current) use of aspirin: Secondary | ICD-10-CM

## 2013-05-26 DIAGNOSIS — J449 Chronic obstructive pulmonary disease, unspecified: Secondary | ICD-10-CM | POA: Diagnosis present

## 2013-05-26 DIAGNOSIS — I10 Essential (primary) hypertension: Secondary | ICD-10-CM | POA: Diagnosis present

## 2013-05-26 DIAGNOSIS — K219 Gastro-esophageal reflux disease without esophagitis: Secondary | ICD-10-CM | POA: Diagnosis present

## 2013-05-26 DIAGNOSIS — J4489 Other specified chronic obstructive pulmonary disease: Secondary | ICD-10-CM | POA: Diagnosis present

## 2013-05-26 DIAGNOSIS — G4730 Sleep apnea, unspecified: Secondary | ICD-10-CM | POA: Diagnosis present

## 2013-05-26 DIAGNOSIS — Z87891 Personal history of nicotine dependence: Secondary | ICD-10-CM

## 2013-05-26 DIAGNOSIS — E785 Hyperlipidemia, unspecified: Secondary | ICD-10-CM | POA: Diagnosis present

## 2013-05-26 DIAGNOSIS — Y831 Surgical operation with implant of artificial internal device as the cause of abnormal reaction of the patient, or of later complication, without mention of misadventure at the time of the procedure: Secondary | ICD-10-CM | POA: Diagnosis present

## 2013-05-26 DIAGNOSIS — M87052 Idiopathic aseptic necrosis of left femur: Secondary | ICD-10-CM

## 2013-05-26 DIAGNOSIS — I2510 Atherosclerotic heart disease of native coronary artery without angina pectoris: Secondary | ICD-10-CM | POA: Diagnosis present

## 2013-05-26 DIAGNOSIS — M87059 Idiopathic aseptic necrosis of unspecified femur: Secondary | ICD-10-CM | POA: Diagnosis present

## 2013-05-26 DIAGNOSIS — M130 Polyarthritis, unspecified: Secondary | ICD-10-CM | POA: Diagnosis present

## 2013-05-26 DIAGNOSIS — T84099A Other mechanical complication of unspecified internal joint prosthesis, initial encounter: Principal | ICD-10-CM | POA: Diagnosis present

## 2013-05-26 DIAGNOSIS — Z6841 Body Mass Index (BMI) 40.0 and over, adult: Secondary | ICD-10-CM

## 2013-05-26 DIAGNOSIS — Z85828 Personal history of other malignant neoplasm of skin: Secondary | ICD-10-CM

## 2013-05-26 HISTORY — PX: HARDWARE REMOVAL: SHX979

## 2013-05-26 HISTORY — PX: TOTAL HIP ARTHROPLASTY: SHX124

## 2013-05-26 SURGERY — REMOVAL, HARDWARE
Anesthesia: General | Site: Hip | Laterality: Left | Wound class: Clean

## 2013-05-26 MED ORDER — LACTATED RINGERS IV SOLN
INTRAVENOUS | Status: DC | PRN
  Administered 2013-05-26 (×2): via INTRAVENOUS

## 2013-05-26 MED ORDER — HYDROCHLOROTHIAZIDE 25 MG PO TABS
25.0000 mg | ORAL_TABLET | Freq: Every day | ORAL | Status: DC
  Administered 2013-05-26 – 2013-05-29 (×4): 25 mg via ORAL
  Filled 2013-05-26 (×4): qty 1

## 2013-05-26 MED ORDER — PANTOPRAZOLE SODIUM 40 MG PO TBEC
40.0000 mg | DELAYED_RELEASE_TABLET | Freq: Every day | ORAL | Status: DC
  Administered 2013-05-27 – 2013-05-29 (×3): 40 mg via ORAL
  Filled 2013-05-26 (×3): qty 1

## 2013-05-26 MED ORDER — OXYCODONE HCL 5 MG PO TABS: 5.0000 mg | ORAL_TABLET | Freq: Once | ORAL | Status: DC | PRN

## 2013-05-26 MED ORDER — SODIUM CHLORIDE 0.9 % IV SOLN
INTRAVENOUS | Status: DC
  Administered 2013-05-26: 13:00:00 via INTRAVENOUS

## 2013-05-26 MED ORDER — PROPOFOL 10 MG/ML IV BOLUS
INTRAVENOUS | Status: DC | PRN
  Administered 2013-05-26: 200 mg via INTRAVENOUS

## 2013-05-26 MED ORDER — METHOCARBAMOL 500 MG PO TABS
ORAL_TABLET | ORAL | Status: AC
  Administered 2013-05-26: 500 mg
  Filled 2013-05-26: qty 1

## 2013-05-26 MED ORDER — OXYCODONE HCL 5 MG PO TABS
ORAL_TABLET | ORAL | Status: AC
  Administered 2013-05-26: 5 mg
  Filled 2013-05-26: qty 1

## 2013-05-26 MED ORDER — HYDROMORPHONE HCL PF 1 MG/ML IJ SOLN
INTRAMUSCULAR | Status: AC
  Filled 2013-05-26: qty 1

## 2013-05-26 MED ORDER — METOPROLOL SUCCINATE ER 25 MG PO TB24
25.0000 mg | ORAL_TABLET | Freq: Every day | ORAL | Status: DC
  Administered 2013-05-27 – 2013-05-29 (×3): 25 mg via ORAL
  Filled 2013-05-26 (×3): qty 1

## 2013-05-26 MED ORDER — ONDANSETRON HCL 4 MG PO TABS: 4.0000 mg | ORAL_TABLET | Freq: Four times a day (QID) | ORAL | Status: DC | PRN

## 2013-05-26 MED ORDER — ASPIRIN EC 325 MG PO TBEC
325.0000 mg | DELAYED_RELEASE_TABLET | Freq: Every day | ORAL | Status: DC
  Administered 2013-05-27 – 2013-05-29 (×3): 325 mg via ORAL
  Filled 2013-05-26 (×4): qty 1

## 2013-05-26 MED ORDER — METOCLOPRAMIDE HCL 10 MG PO TABS: 5.0000 mg | ORAL_TABLET | Freq: Three times a day (TID) | ORAL | Status: DC | PRN

## 2013-05-26 MED ORDER — ONDANSETRON HCL 4 MG/2ML IJ SOLN: 4.0000 mg | Freq: Four times a day (QID) | INTRAMUSCULAR | Status: DC | PRN

## 2013-05-26 MED ORDER — HYDROMORPHONE HCL PF 1 MG/ML IJ SOLN
1.0000 mg | INTRAMUSCULAR | Status: DC | PRN
  Administered 2013-05-26: 1 mg via INTRAVENOUS
  Administered 2013-05-26: 0.5 mg via INTRAVENOUS
  Administered 2013-05-27: 1 mg via INTRAVENOUS
  Filled 2013-05-26 (×2): qty 1

## 2013-05-26 MED ORDER — ATORVASTATIN CALCIUM 10 MG PO TABS
10.0000 mg | ORAL_TABLET | Freq: Every day | ORAL | Status: DC
  Administered 2013-05-27 – 2013-05-28 (×2): 10 mg via ORAL
  Filled 2013-05-26 (×4): qty 1

## 2013-05-26 MED ORDER — HYDROMORPHONE HCL PF 1 MG/ML IJ SOLN
0.2500 mg | INTRAMUSCULAR | Status: DC | PRN
  Administered 2013-05-26 (×4): 0.5 mg via INTRAVENOUS

## 2013-05-26 MED ORDER — CEFAZOLIN SODIUM-DEXTROSE 2-3 GM-% IV SOLR
2.0000 g | Freq: Four times a day (QID) | INTRAVENOUS | Status: AC
  Administered 2013-05-26 (×2): 2 g via INTRAVENOUS
  Filled 2013-05-26 (×2): qty 50

## 2013-05-26 MED ORDER — SODIUM CHLORIDE 0.9 % IR SOLN
Status: DC | PRN
  Administered 2013-05-26: 1000 mL

## 2013-05-26 MED ORDER — GLYCOPYRROLATE 0.2 MG/ML IJ SOLN
INTRAMUSCULAR | Status: DC | PRN
  Administered 2013-05-26: 0.6 mg via INTRAVENOUS

## 2013-05-26 MED ORDER — SUCCINYLCHOLINE CHLORIDE 20 MG/ML IJ SOLN
INTRAMUSCULAR | Status: DC | PRN
  Administered 2013-05-26: 140 mg via INTRAVENOUS

## 2013-05-26 MED ORDER — LIDOCAINE HCL (CARDIAC) 20 MG/ML IV SOLN
INTRAVENOUS | Status: DC | PRN
  Administered 2013-05-26: 60 mg via INTRAVENOUS

## 2013-05-26 MED ORDER — METHOCARBAMOL 500 MG PO TABS
500.0000 mg | ORAL_TABLET | Freq: Four times a day (QID) | ORAL | Status: DC | PRN
  Administered 2013-05-26 – 2013-05-29 (×4): 500 mg via ORAL
  Filled 2013-05-26 (×4): qty 1

## 2013-05-26 MED ORDER — FENTANYL CITRATE 0.05 MG/ML IJ SOLN
INTRAMUSCULAR | Status: DC | PRN
  Administered 2013-05-26: 100 ug via INTRAVENOUS
  Administered 2013-05-26 (×3): 50 ug via INTRAVENOUS

## 2013-05-26 MED ORDER — LISINOPRIL 5 MG PO TABS
5.0000 mg | ORAL_TABLET | Freq: Every day | ORAL | Status: DC
  Administered 2013-05-26 – 2013-05-29 (×4): 5 mg via ORAL
  Filled 2013-05-26 (×4): qty 1

## 2013-05-26 MED ORDER — KETOROLAC TROMETHAMINE 15 MG/ML IJ SOLN
15.0000 mg | Freq: Four times a day (QID) | INTRAMUSCULAR | Status: AC
  Administered 2013-05-26 – 2013-05-27 (×3): 15 mg via INTRAVENOUS
  Filled 2013-05-26 (×4): qty 1

## 2013-05-26 MED ORDER — METOCLOPRAMIDE HCL 5 MG/ML IJ SOLN: 5.0000 mg | Freq: Three times a day (TID) | INTRAMUSCULAR | Status: DC | PRN

## 2013-05-26 MED ORDER — FERROUS SULFATE 325 (65 FE) MG PO TABS
325.0000 mg | ORAL_TABLET | Freq: Three times a day (TID) | ORAL | Status: DC
  Administered 2013-05-26 – 2013-05-29 (×10): 325 mg via ORAL
  Filled 2013-05-26 (×12): qty 1

## 2013-05-26 MED ORDER — DIPHENHYDRAMINE HCL 12.5 MG/5ML PO ELIX: 12.5000 mg | ORAL_SOLUTION | ORAL | Status: DC | PRN

## 2013-05-26 MED ORDER — PHENOL 1.4 % MT LIQD
1.0000 | OROMUCOSAL | Status: DC | PRN
Start: 2013-05-26 — End: 2013-05-29

## 2013-05-26 MED ORDER — ACETAMINOPHEN 325 MG PO TABS: 650.0000 mg | ORAL_TABLET | Freq: Four times a day (QID) | ORAL | Status: DC | PRN

## 2013-05-26 MED ORDER — ROCURONIUM BROMIDE 100 MG/10ML IV SOLN
INTRAVENOUS | Status: DC | PRN
  Administered 2013-05-26: 20 mg via INTRAVENOUS
  Administered 2013-05-26: 30 mg via INTRAVENOUS

## 2013-05-26 MED ORDER — MENTHOL 3 MG MT LOZG: 1.0000 | LOZENGE | OROMUCOSAL | Status: DC | PRN

## 2013-05-26 MED ORDER — METHOCARBAMOL 100 MG/ML IJ SOLN
500.0000 mg | Freq: Four times a day (QID) | INTRAVENOUS | Status: DC | PRN
  Filled 2013-05-26: qty 5

## 2013-05-26 MED ORDER — ACETAMINOPHEN 650 MG RE SUPP: 650.0000 mg | Freq: Four times a day (QID) | RECTAL | Status: DC | PRN

## 2013-05-26 MED ORDER — OXYCODONE HCL 5 MG/5ML PO SOLN: 5.0000 mg | Freq: Once | ORAL | Status: DC | PRN

## 2013-05-26 MED ORDER — ALUM & MAG HYDROXIDE-SIMETH 200-200-20 MG/5ML PO SUSP: 30.0000 mL | ORAL | Status: DC | PRN

## 2013-05-26 MED ORDER — ONDANSETRON HCL 4 MG/2ML IJ SOLN
INTRAMUSCULAR | Status: DC | PRN
  Administered 2013-05-26: 4 mg via INTRAVENOUS

## 2013-05-26 MED ORDER — OXYCODONE HCL 5 MG PO TABS
5.0000 mg | ORAL_TABLET | ORAL | Status: DC | PRN
  Administered 2013-05-26 – 2013-05-29 (×5): 10 mg via ORAL
  Filled 2013-05-26 (×5): qty 2

## 2013-05-26 MED ORDER — NEOSTIGMINE METHYLSULFATE 1 MG/ML IJ SOLN
INTRAMUSCULAR | Status: DC | PRN
  Administered 2013-05-26: 5 mg via INTRAVENOUS

## 2013-05-26 SURGICAL SUPPLY — 69 items
BANDAGE ELASTIC 4 VELCRO ST LF (GAUZE/BANDAGES/DRESSINGS) IMPLANT
BANDAGE ELASTIC 6 VELCRO ST LF (GAUZE/BANDAGES/DRESSINGS) IMPLANT
BANDAGE ESMARK 6X9 LF (GAUZE/BANDAGES/DRESSINGS) IMPLANT
BANDAGE GAUZE ELAST BULKY 4 IN (GAUZE/BANDAGES/DRESSINGS) ×2 IMPLANT
BLADE SAW SAG 73X25 THK (BLADE) ×1
BLADE SAW SGTL 73X25 THK (BLADE) ×1 IMPLANT
BLADE SURG 10 STRL SS (BLADE) IMPLANT
BLADE SURG 21 STRL SS (BLADE) ×2 IMPLANT
BNDG CMPR 9X6 STRL LF SNTH (GAUZE/BANDAGES/DRESSINGS)
BNDG COHESIVE 4X5 TAN STRL (GAUZE/BANDAGES/DRESSINGS) IMPLANT
BNDG ESMARK 6X9 LF (GAUZE/BANDAGES/DRESSINGS)
BRUSH FEMORAL CANAL (MISCELLANEOUS) IMPLANT
CAP POROUS W/METAL ON POLY ×1 IMPLANT
CLOTH BEACON ORANGE TIMEOUT ST (SAFETY) ×2 IMPLANT
COVER BACK TABLE 24X17X13 BIG (DRAPES) IMPLANT
COVER SURGICAL LIGHT HANDLE (MISCELLANEOUS) ×2 IMPLANT
CUFF TOURNIQUET SINGLE 34IN LL (TOURNIQUET CUFF) IMPLANT
CUFF TOURNIQUET SINGLE 44IN (TOURNIQUET CUFF) IMPLANT
DRAPE C-ARM 42X72 X-RAY (DRAPES) IMPLANT
DRAPE INCISE IOBAN 66X45 STRL (DRAPES) IMPLANT
DRAPE INCISE IOBAN 85X60 (DRAPES) ×2 IMPLANT
DRAPE ORTHO SPLIT 77X108 STRL (DRAPES) ×4
DRAPE SURG ORHT 6 SPLT 77X108 (DRAPES) ×2 IMPLANT
DRAPE U-SHAPE 47X51 STRL (DRAPES) ×2 IMPLANT
DRSG EMULSION OIL 3X3 NADH (GAUZE/BANDAGES/DRESSINGS) ×2 IMPLANT
DRSG MEPILEX BORDER 4X12 (GAUZE/BANDAGES/DRESSINGS) ×1 IMPLANT
DRSG MEPILEX BORDER 4X8 (GAUZE/BANDAGES/DRESSINGS) IMPLANT
DRSG PAD ABDOMINAL 8X10 ST (GAUZE/BANDAGES/DRESSINGS) ×2 IMPLANT
DURAPREP 26ML APPLICATOR (WOUND CARE) ×2 IMPLANT
ELECT BLADE 6.5 EXT (BLADE) IMPLANT
ELECT CAUTERY BLADE 6.4 (BLADE) ×2 IMPLANT
ELECT REM PT RETURN 9FT ADLT (ELECTROSURGICAL) ×2
ELECTRODE REM PT RTRN 9FT ADLT (ELECTROSURGICAL) ×1 IMPLANT
GLOVE BIOGEL PI IND STRL 9 (GLOVE) ×1 IMPLANT
GLOVE BIOGEL PI INDICATOR 9 (GLOVE) ×1
GLOVE SURG ORTHO 9.0 STRL STRW (GLOVE) ×2 IMPLANT
GOWN PREVENTION PLUS XLARGE (GOWN DISPOSABLE) ×2 IMPLANT
GOWN SRG XL XLNG 56XLVL 4 (GOWN DISPOSABLE) ×2 IMPLANT
GOWN STRL NON-REIN XL XLG LVL4 (GOWN DISPOSABLE) ×4
HANDPIECE INTERPULSE COAX TIP (DISPOSABLE)
KIT BASIN OR (CUSTOM PROCEDURE TRAY) ×2 IMPLANT
KIT ROOM TURNOVER OR (KITS) ×2 IMPLANT
MANIFOLD NEPTUNE II (INSTRUMENTS) ×2 IMPLANT
NS IRRIG 1000ML POUR BTL (IV SOLUTION) ×2 IMPLANT
PACK ORTHO EXTREMITY (CUSTOM PROCEDURE TRAY) ×2 IMPLANT
PACK TOTAL JOINT (CUSTOM PROCEDURE TRAY) ×2 IMPLANT
PAD ARMBOARD 7.5X6 YLW CONV (MISCELLANEOUS) ×4 IMPLANT
PAD CAST 4YDX4 CTTN HI CHSV (CAST SUPPLIES) ×1 IMPLANT
PADDING CAST COTTON 4X4 STRL (CAST SUPPLIES) ×2
PRESSURIZER FEMORAL UNIV (MISCELLANEOUS) IMPLANT
SET HNDPC FAN SPRY TIP SCT (DISPOSABLE) IMPLANT
SPONGE GAUZE 4X4 12PLY (GAUZE/BANDAGES/DRESSINGS) ×2 IMPLANT
SPONGE LAP 18X18 X RAY DECT (DISPOSABLE) ×2 IMPLANT
STAPLER VISISTAT 35W (STAPLE) ×2 IMPLANT
STOCKINETTE IMPERVIOUS 9X36 MD (GAUZE/BANDAGES/DRESSINGS) IMPLANT
SUT ETHIBOND NAB CT1 #1 30IN (SUTURE) ×2 IMPLANT
SUT ETHILON 2 0 PSLX (SUTURE) IMPLANT
SUT VIC AB 0 CT1 27 (SUTURE) ×4
SUT VIC AB 0 CT1 27XBRD ANBCTR (SUTURE) ×2 IMPLANT
SUT VIC AB 1 CTX 36 (SUTURE) ×2
SUT VIC AB 1 CTX36XBRD ANBCTR (SUTURE) ×1 IMPLANT
SUT VIC AB 2-0 CT1 27 (SUTURE)
SUT VIC AB 2-0 CT1 TAPERPNT 27 (SUTURE) IMPLANT
SUT VIC AB 2-0 CTB1 (SUTURE) ×4 IMPLANT
TOWEL OR 17X24 6PK STRL BLUE (TOWEL DISPOSABLE) ×2 IMPLANT
TOWEL OR 17X26 10 PK STRL BLUE (TOWEL DISPOSABLE) ×2 IMPLANT
TOWER CARTRIDGE SMART MIX (DISPOSABLE) IMPLANT
TRAY FOLEY CATH 16FRSI W/METER (SET/KITS/TRAYS/PACK) IMPLANT
WATER STERILE IRR 1000ML POUR (IV SOLUTION) ×6 IMPLANT

## 2013-05-26 NOTE — Addendum Note (Signed)
Addendum created 05/26/13 1404 by Lovie Chol, CRNA   Modules edited: Anesthesia Flowsheet

## 2013-05-26 NOTE — Progress Notes (Signed)
Orthopedic Tech Progress Note Patient Details:  Leor Whyte Mar 28, 1951 [id number]  Patient ID: [name], male   DOB: Jul 16, 1951, 62 y.o.   MRN: [id number] Trapeze bar patient helper; viewed order from doctor's order list  Nikki Dom 05/26/2013, 1:26 PM

## 2013-05-26 NOTE — Op Note (Signed)
OPERATIVE REPORT  DATE OF SURGERY: 05/26/2013  PATIENT:  Alfred Carney,  62 y.o. 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 Size 6 femur. Size 54 acetabulum. 0 polyethylene liner. 36 mm +0 head. Stem 4 mm in length and 8 mm offset J1.  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 Patient is status post avascular necrosis of the left hip. He previously underwent free vascularized fibula at [facility]. Patient has had progressive degenerative arthritic changes and presents at this time for revision to a total hip arthroplasty. Risks and benefits were discussed including infection neurovascular injury pain DVT pulmonary embolus dislocation and need for additional surgery. Patient states he understands and wished to proceed at this time. Description of procedure patient was brought to the operating room and underwent a general anesthetic. After adequate levels of anesthesia were obtained patient was placed in the right lateral decubitus position with left side up and the left lower extremity was prepped using DuraPrep and draped into a sterile field. A posterior lateral incision was made incorporating his previous incision. This was carried down to the tensor fascia lata which was split. The piriformis short external rotators and capsule were incised longitudinally off the neck and retracted with an Ethibond suture. The hip was dislocated and the neck cut was made 1 cm proximal to the calcar. Attention was first focused on the acetabulum. Acetabulum was sequentially reamed to 54 mm. A final 54 mm implant was placed 45 of abduction  and 20 of anteversion. The final 0 polyethylene liner was placed. Attention was then focused on the femur. The femur was sequentially broached up to a size 6. This was then trialed with different neck offset once the final size 6 femoral stem was placed. The J1 stem had the best stability with negative shuck and had full flexion to 110 full abduction with internal rotation of 50. The final head and neck were then placed. Patient's hip was still stable within this range of motion. The hip was irrigated throughout the case. Capsule short external rotators and piriformis were reapproximated with the #1 Ethibond. The tensor fascia lata was closed using #1 Vicryl. Subcutaneous was closed using 0 Vicryl. Skin was closed using staples. The wound is covered with a Mepilex dressing. A bed pillow was placed between the legs patient was extubated taken to the PACU in stable condition.  PLAN OF CARE: Admit to inpatient   PATIENT DISPOSITION:  PACU - hemodynamically stable.   Nadara Mustard, MD 05/26/2013 10:00 AM

## 2013-05-26 NOTE — H&P (Signed)
TOTAL HIP ADMISSION H&P  Patient is admitted for left total hip arthroplasty.  Subjective:  Chief Complaint: left hip pain  HPI: Alfred Carney, 62 y.o. male, has a history of pain and functional disability in the left hip(s) due to arthritis and patient has failed non-surgical conservative treatments for greater than 12 weeks to include NSAID's and/or analgesics, use of assistive devices and activity modification.  Onset of symptoms was gradual starting 8 years ago with gradually worsening course since that time.The patient noted prior procedures of the hip to include Free vascularized fibula on the left hip(s).  Patient currently rates pain in the left hip at 8 out of 10 with activity. Patient has night pain, worsening of pain with activity and weight bearing, pain that interfers with activities of daily living, pain with passive range of motion and crepitus. Patient has evidence of subchondral cysts, subchondral sclerosis, periarticular osteophytes and joint space narrowing by imaging studies. This condition presents safety issues increasing the risk of falls. This patient has had avascular necrosis of the hip, acetabular fracture, hip dysplasia.  There is no current active infection.  Patient Active Problem List   Diagnosis Date Noted  . CAD (coronary artery disease) 09/25/2011  . PAD (peripheral artery disease) 09/25/2011  . Hyperlipidemia 09/25/2011  . HTN (hypertension) 09/25/2011  . MORBID OBESITY 06/30/2007  . COPD 06/30/2007  . ABDOMINAL AORTIC ANEURYSM, HX OF 06/30/2007   Past Medical History  Diagnosis Date  . Morbid obesity   . Chronic obstructive pulmonary disease 2008    Moderate  . Community acquired pneumonia   . Hypertension   . Hyperlipidemia   . Degenerative joint disease   . Coronary artery disease   . Gastroesophageal reflux disease   . 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 History of [place] spotted fever     Possible  history of [place] Spotted Fever  . Hypokalemia     diuretic induced, resolved  . Shortness of breath   . Gout   . Colon polyps   . H/O hiatal hernia   . Cancer     skin cancer  . Enlarged liver     fatty liver by '09 CT  . Sleep apnea     not currently using CPAP 05/20/13    Past Surgical History  Procedure Laterality Date  . Tonsillectomy    . Cholecystectomy    . Hip surgery      Status post left hip surgery with bone grafting  . Cardiac catheterization  2007    Ejection fraction is estimated at 60%  . Thoracoabdominal aortic aneurysm repair      with right femoral and left iliac BPG and reimplantation of renal arteries.    Prescriptions prior to admission  Medication Sig Dispense Refill  . aspirin 81 MG tablet Take 81 mg by mouth daily.       . hydrochlorothiazide (HYDRODIURIL) 25 MG tablet Take 25 mg by mouth daily.      Marland Kitchen ibuprofen (ADVIL,MOTRIN) 200 MG tablet Take 200-400 mg by mouth every 6 (six) hours as needed for pain.      Marland Kitchen lisinopril (PRINIVIL,ZESTRIL) 5 MG tablet Take 5 mg by mouth daily.      . metoprolol succinate (TOPROL-XL) 25 MG 24 hr tablet Take 25 mg by mouth daily.      Marland Kitchen omeprazole (PRILOSEC) 20 MG capsule Take 20 mg by mouth daily.      Marland Kitchen oxyCODONE-acetaminophen (PERCOCET) 10-325  MG per tablet Take 1 tablet by mouth every 4 (four) hours as needed for pain.      . rosuvastatin (CRESTOR) 10 MG tablet Take 1 tablet (10 mg total) by mouth at bedtime.  30 tablet  5   No Known Allergies  History  Substance Use Topics  . Smoking status: Former Games developer  . Smokeless tobacco: Never Used  . Alcohol Use: No    Family History  Problem Relation Age of Onset  . Osteoarthritis Mother   . Diabetes Mother   . Heart disease Father     Coronary Artery Disease  . Multiple sclerosis Sister      Review of Systems  All other systems reviewed and are negative.    Objective:  Physical Exam  Vital signs in last 24 hours: Temp:  [97 F (36.1 C)] 97 F  (36.1 C) (09/10 4782) Pulse Rate:  [78] 78 (09/10 0638) Resp:  [20] 20 (09/10 0638) BP: (119)/(77) 119/77 mmHg (09/10 0638) SpO2:  [94 %] 94 % (09/10 0638)  Labs:   Estimated body mass index is 44.71 kg/(m^2) as calculated from the following:   Height as of 05/20/13: 5\' 10"  (1.778 m).   Weight as of 11/07/11: 141.341 kg (311 lb 9.6 oz).   Imaging Review Plain radiographs demonstrate moderate degenerative joint disease of the left hip(s). The bone quality appears to be adequate for age and reported activity level.  Assessment/Plan:  End stage arthritis, left hip(s)  The patient history, physical examination, clinical judgement of the provider and imaging studies are consistent with end stage degenerative joint disease of the left hip(s) and total hip arthroplasty is deemed medically necessary. The treatment options including medical management, injection therapy, arthroscopy and arthroplasty were discussed at length. The risks and benefits of total hip arthroplasty were presented and reviewed. The risks due to aseptic loosening, infection, stiffness, dislocation/subluxation,  thromboembolic complications and other imponderables were discussed.  The patient acknowledged the explanation, agreed to proceed with the plan and consent was signed. Patient is being admitted for inpatient treatment for surgery, pain control, PT, OT, prophylactic antibiotics, VTE prophylaxis, progressive ambulation and ADL's and discharge planning.The patient is planning to be discharged home with home health services

## 2013-05-26 NOTE — Anesthesia Procedure Notes (Signed)
Procedure Name: Intubation Date/Time: 05/26/2013 8:31 AM Performed by: Lovie Chol Pre-anesthesia Checklist: Patient identified, Emergency Drugs available, Suction available, Patient being monitored and Timeout performed Patient Re-evaluated:Patient Re-evaluated prior to inductionOxygen Delivery Method: Circle system utilized Preoxygenation: Pre-oxygenation with 100% oxygen Intubation Type: IV induction Laryngoscope Size: Miller and 2 Grade View: Grade I Tube type: Oral Tube size: 7.5 mm Airway Equipment and Method: Stylet Placement Confirmation: ETT inserted through vocal cords under direct vision,  positive ETCO2,  CO2 detector and breath sounds checked- equal and bilateral Secured at: 22 cm Tube secured with: Tape Dental Injury: Teeth and Oropharynx as per pre-operative assessment

## 2013-05-26 NOTE — Anesthesia Postprocedure Evaluation (Signed)
Anesthesia Post Note  Patient: Alfred Carney  Procedure(s) Performed: Procedure(s) (LRB): HARDWARE REMOVAL (Left) TOTAL HIP ARTHROPLASTY (Left)  Anesthesia type: General  Patient location: PACU  Post pain: Pain level controlled and Adequate analgesia  Post assessment: Post-op Vital signs reviewed, Patient's Cardiovascular Status Stable, Respiratory Function Stable, Patent Airway and Pain level controlled  Last Vitals:  Filed Vitals:   05/26/13 1115  BP: 139/98  Pulse: 69  Temp:   Resp: 12    Post vital signs: Reviewed and stable  Level of consciousness: awake, alert  and oriented  Complications: No apparent anesthesia complications

## 2013-05-26 NOTE — Preoperative (Signed)
Beta Blockers   Reason not to administer Beta Blockers:Not Applicable 

## 2013-05-26 NOTE — Anesthesia Preprocedure Evaluation (Addendum)
Anesthesia Evaluation  Patient identified by MRN, date of birth, ID band Patient awake    Reviewed: Allergy & Precautions, H&P , NPO status , Patient's Chart, lab work & pertinent test results, reviewed documented beta blocker date and time   History of Anesthesia Complications Negative for: history of anesthetic complications  Airway Mallampati: III TM Distance: >3 FB Neck ROM: full    Dental  (+) Edentulous Upper, Edentulous Lower and Dental Advisory Given   Pulmonary shortness of breath and with exertion, sleep apnea , COPDformer smoker,          Cardiovascular hypertension, Pt. on medications and Pt. on home beta blockers + CAD and + Peripheral Vascular Disease Rhythm:Regular Rate:Normal  S/p thoracoabdominal aneurysm repair   Neuro/Psych    GI/Hepatic Neg liver ROS, hiatal hernia, GERD-  Medicated and Controlled,  Endo/Other  Morbid obesity  Renal/GU negative Renal ROS     Musculoskeletal   Abdominal   Peds  Hematology negative hematology ROS (+)   Anesthesia Other Findings   Reproductive/Obstetrics negative OB ROS                          Anesthesia Physical Anesthesia Plan  ASA: III  Anesthesia Plan: General   Post-op Pain Management:    Induction: Intravenous  Airway Management Planned: Oral ETT  Additional Equipment:   Intra-op Plan:   Post-operative Plan: Extubation in OR  Informed Consent: I have reviewed the patients History and Physical, chart, labs and discussed the procedure including the risks, benefits and alternatives for the proposed anesthesia with the patient or authorized representative who has indicated his/her understanding and acceptance.     Plan Discussed with: CRNA, Anesthesiologist and Surgeon  Anesthesia Plan Comments:         Anesthesia Quick Evaluation

## 2013-05-26 NOTE — Transfer of Care (Signed)
Immediate Anesthesia Transfer of Care Note  Patient: Alfred Carney  Procedure(s) Performed: Procedure(s) with comments: HARDWARE REMOVAL (Left) - Left Total Hip Arthroplasty, Removal of Deep Hardware TOTAL HIP ARTHROPLASTY (Left) - Left Total Hip Arthroplasty, Removal Deep Hardware  Patient Location: PACU  Anesthesia Type:General  Level of Consciousness: awake, alert , oriented and patient cooperative  Airway & Oxygen Therapy: Patient Spontanous Breathing and Patient connected to face mask oxygen  Post-op Assessment: Report given to PACU RN and Post -op Vital signs reviewed and stable  Post vital signs: Reviewed and stable  Complications: No apparent anesthesia complications

## 2013-05-27 LAB — BASIC METABOLIC PANEL
BUN: 17 mg/dL (ref 6–23)
CO2: 31 mEq/L (ref 19–32)
Calcium: 8.5 mg/dL (ref 8.4–10.5)
Chloride: 98 mEq/L (ref 96–112)
Creatinine, Ser: 1.53 mg/dL — ABNORMAL HIGH (ref 0.50–1.35)
GFR calc Af Amer: 54 mL/min — ABNORMAL LOW (ref >90)
GFR calc non Af Amer: 47 mL/min — ABNORMAL LOW (ref >90)
Glucose, Bld: 149 mg/dL — ABNORMAL HIGH (ref 70–99)
Potassium: 3.7 mEq/L (ref 3.5–5.1)
Sodium: 137 mEq/L (ref 135–145)

## 2013-05-27 LAB — CBC
HCT: 37.6 % — ABNORMAL LOW (ref 39.0–52.0)
Hemoglobin: 12.5 g/dL — ABNORMAL LOW (ref 13.0–17.0)
MCH: 28.7 pg (ref 26.0–34.0)
MCHC: 33.2 g/dL (ref 30.0–36.0)
MCV: 86.4 fL (ref 78.0–100.0)
Platelets: 186 10*3/uL (ref 150–400)
RBC: 4.35 MIL/uL (ref 4.22–5.81)
RDW: 15.2 % (ref 11.5–15.5)
WBC: 9.4 10*3/uL (ref 4.0–10.5)

## 2013-05-27 LAB — GLUCOSE, CAPILLARY: Glucose-Capillary: 128 mg/dL — ABNORMAL HIGH (ref 70–99)

## 2013-05-27 NOTE — Progress Notes (Signed)
Physical Therapy Treatment Patient Details Name: Alfred Carney MRN: [id number] DOB: 04/24/1951 Today's Date: 05/27/2013 Time: 4098-1191 PT Time Calculation (min): 27 min  PT Assessment / Plan / Recommendation  History of Present Illness s/p posterior L THA with posterior hip precautions.   PT Comments   Patient progressing with all mobility this session. Patient limited by overall fatigue, stating that he had not slept much. Patient able to make it to hallway with ambulation. His hopes is to be able to go home on Saturday. Continue to increase ambulation and therex as able. Patients wife present throughout  Follow Up Recommendations  Home health PT;Supervision/Assistance - 24 hour;Supervision for mobility/OOB;Other (comment)     Does the patient have the potential to tolerate intense rehabilitation     Barriers to Discharge        Equipment Recommendations  None recommended by PT    Recommendations for Other Services    Frequency 7X/week   Progress towards PT Goals Progress towards PT goals: Progressing toward goals  Plan Current plan remains appropriate    Precautions / Restrictions Precautions Precautions: Posterior Hip Precaution Booklet Issued: Yes (comment) Precaution Comments: Reviewed all precautions with patient Restrictions Weight Bearing Restrictions: Yes LLE Weight Bearing: Weight bearing as tolerated   Pertinent Vitals/Pain no apparent distress     Mobility  Bed Mobility Bed Mobility: Supine to Sit;Sitting - Scoot to Edge of Bed Supine to Sit: 3: Mod assist Supine to Sit: Patient Percentage: 50% Sitting - Scoot to Edge of Bed: 4: Min assist Details for Bed Mobility Assistance: A for L LE and for HHA as patient pulled trunk up out of bed. CUes for technique and positioning Transfers Transfers: Sit to Stand;Stand to Sit Sit to Stand: 4: Min assist;From bed;With upper extremity assist Sit to Stand: Patient Percentage: 50% Stand to Sit: 4: Min assist;To  chair/3-in-1;With upper extremity assist;With armrests Stand to Sit: Patient Percentage: 70% Details for Transfer Assistance: vc's for technique and sequencing; A to control sitting into the recliner Ambulation/Gait Ambulation/Gait Assistance: 4: Min assist Ambulation/Gait: Patient Percentage: 70% Ambulation Distance (Feet): 18 Feet Assistive device: Rolling walker Ambulation/Gait Assistance Details: A to ensure balance and safety with RW. CUes for sequency and posture Gait Pattern: Step-to pattern;Decreased step length - right;Decreased step length - left;Trunk flexed Gait velocity: decreased    Exercises Total Joint Exercises Ankle Circles/Pumps: AROM;Both;10 reps Quad Sets: AROM;10 reps;Left Gluteal Sets: AROM;Left;Both Heel Slides: AAROM;Left;10 reps Hip ABduction/ADduction: AAROM;Left;10 reps Long Arc Quad: AROM;Left;5 reps;Seated   PT Diagnosis: Difficulty walking;Abnormality of gait;Acute pain  PT Problem List: Decreased strength;Decreased activity tolerance;Decreased mobility;Decreased balance;Decreased knowledge of use of DME;Decreased knowledge of precautions;Pain;Obesity PT Treatment Interventions: DME instruction;Gait training;Stair training;Functional mobility training;Therapeutic activities;Therapeutic exercise;Balance training;Patient/family education   PT Goals (current goals can now be found in the care plan section) Acute Rehab PT Goals Patient Stated Goal: to return home PT Goal Formulation: With patient Time For Goal Achievement: 06/03/13 Potential to Achieve Goals: Good  Visit Information  Last PT Received On: 05/27/13 Assistance Needed: +2 (for ambulation) PT/OT Co-Evaluation/Treatment: Yes History of Present Illness: s/p posterior L THA with posterior hip precautions.    Subjective Data  Patient Stated Goal: to return home   Cognition  Cognition Arousal/Alertness: Awake/alert Behavior During Therapy: WFL for tasks assessed/performed Overall Cognitive  Status: Within Functional Limits for tasks assessed    Balance  Balance Balance Assessed: Yes Dynamic Standing Balance Dynamic Standing - Balance Support: Bilateral upper extremity supported;During functional activity Dynamic Standing - Level of Assistance:  1: +2 Total assist;Patient percentage (comment) (pt. 70%)  End of Session PT - End of Session Equipment Utilized During Treatment: Gait belt Activity Tolerance: Patient limited by fatigue Patient left: in chair;with call bell/phone within reach;with family/visitor present Nurse Communication: Mobility status   GP     Fredrich Birks 05/27/2013, 2:23 PM 05/27/2013 Fredrich Birks PTA [phone] pager [phone] office

## 2013-05-27 NOTE — Evaluation (Signed)
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  Clinical Impression   This pt. underwent a left THA and presents to PT with anticipated post-op decrease in strength as well as decreased functional mobility and gait.  Pt. Will benefit from acute PT To address these and below issues.  Pt. Will need to progress significantly in preparation for return home, as he is currently at +2 level of assist.  Believe his body habitus is increasing the difficulty level of mobility for him right now.     PT Assessment  Patient needs continued PT services    Follow Up Recommendations  Home health PT;Supervision/Assistance - 24 hour;Supervision for mobility/OOB;Other (comment) (HHPT as long as pt. can progress adequately)    Does the patient have the potential to tolerate intense rehabilitation      Barriers to Discharge        Equipment Recommendations  None recommended by PT    Recommendations for Other Services     Frequency 7X/week    Precautions / Restrictions Precautions Precautions: Posterior Hip Precaution Booklet Issued: Yes (comment) Precaution Comments: Pt able to recall 2/3 hip precautions independently at start of session. Reviewed all 3 precautions and provided handout. Restrictions Weight Bearing Restrictions: Yes LLE Weight Bearing: Weight bearing as tolerated   Pertinent Vitals/Pain See vitals tab       Mobility  Bed Mobility Bed Mobility: Supine to Sit;Sitting - Scoot to Edge of Bed Supine to Sit: 1: +2 Total assist;HOB flat Supine to Sit: Patient Percentage: 50% Sitting - Scoot to Edge of Bed: 4: Min assist Details for Bed Mobility Assistance: Needed heavy assist of 2 to elevate shoulders to achieve sitting position; vc's for  technique , sequencing and for maintaining precautions Transfers Transfers: Sit to Stand;Stand to Sit Sit to Stand: 1: +2 Total assist;From bed;With upper extremity assist;From elevated surface Sit to Stand: Patient Percentage: 50% Stand to Sit: 1: +2 Total assist;To chair/3-in-1;With armrests;With upper extremity assist Stand to Sit: Patient Percentage: 70% Details for Transfer Assistance: vc's for technique and sequencing; needed 2 assist to rise to stand on 2 attmepts.  On first attempt, pt. became dizzy, L LE buckled and he was assisted to seated postion at EOB.  On second attempt, no buckling noted and pt. was not as dizzy.  Chair was moved closer to accomplish transition to recliner. Ambulation/Gait Ambulation/Gait Assistance: 1: +2 Total assist Ambulation/Gait: Patient Percentage: 70% Ambulation Distance (Feet): 2 Feet Assistive device: Rolling walker Ambulation/Gait Assistance Details: Pt. tolderated 2 turning steps bed to recliner with 2 assist.  Needed cues for technique and sequencing and assist for safety and stability. Gait Pattern: Step-to pattern;Decreased step length - right;Decreased step length - left;Trunk flexed    Exercises Total Joint Exercises Ankle Circles/Pumps: AROM;Both;10 reps Quad Sets: AROM;Both;10 reps Long Arc Quad: AROM;Left;5 reps;Seated   PT Diagnosis: Difficulty walking;Abnormality of gait;Acute pain  PT Problem List: Decreased strength;Decreased activity tolerance;Decreased mobility;Decreased balance;Decreased knowledge of use of DME;Decreased knowledge of precautions;Pain;Obesity PT Treatment Interventions: DME instruction;Gait training;Stair training;Functional mobility training;Therapeutic activities;Therapeutic exercise;Balance training;Patient/family education     PT Goals(Current goals can be found in the care plan section) Acute Rehab PT Goals Patient Stated Goal: to return home PT Goal Formulation: With patient Time For Goal Achievement:  06/03/13 Potential to Achieve Goals: Good  Visit Information  Last PT Received On: 05/27/13 Assistance Needed: +2 PT/OT Co-Evaluation/Treatment: Yes History of Present Illness: s/p posterior L THA with posterior hip precautions.       Prior Functioning  Home Living Family/patient expects to be discharged to:: Private residence Available Help at Discharge: Family;Available 24 hours/day Type of Home: House Home Access: Stairs to enter [employer] of Steps: 3 Entrance Stairs-Rails: None Home Layout: One level Home Equipment: Walker - 2 wheels;Bedside commode;Crutches;Cane - single point;Adaptive equipment Adaptive Equipment: Reacher Prior Function Level of Independence: Needs assistance Gait / Transfers Assistance Needed: Has been using cane prior to sx due to painful L hip. ADL's / Homemaking Assistance Needed: Wife assisting with donning socks. Communication Communication: No difficulties Dominant Hand: Right    Cognition  Cognition Arousal/Alertness: Awake/alert Behavior During Therapy: WFL for tasks assessed/performed Overall Cognitive Status: Within Functional Limits for tasks assessed    Extremity/Trunk Assessment Upper Extremity Assessment Upper Extremity Assessment: Overall WFL for tasks assessed Lower Extremity Assessment Lower Extremity Assessment: LLE deficits/detail LLE Deficits / Details: good ankle pump and quad set   Balance Balance Balance Assessed: Yes Dynamic Standing Balance Dynamic Standing - Balance Support: Bilateral upper extremity supported;During functional activity Dynamic Standing - Level of Assistance: 1: +2 Total assist;Patient percentage (comment) (pt. 70%)  End of Session PT - End of Session Equipment Utilized During Treatment: Gait belt Activity Tolerance: Patient limited by fatigue Patient left: in chair;with call bell/phone within reach Nurse Communication: Mobility status;Precautions;Weight bearing status;Other (comment)  (need for +2 assist for all mobility currently)  GP     Ferman Hamming 05/27/2013, 11:22 AM Weldon Picking PT Acute Rehab Services [phone] [facility] [phone]

## 2013-05-27 NOTE — Progress Notes (Signed)
Patient ID: Alfred Carney, male   DOB: April 16, 1951, 62 y.o.   MRN: [id number] Postoperative day 1 left total hip arthroplasty. Patient was out of bed weightbearing yesterday. Posterior total hip precautions. Physical therapy weightbearing as tolerated. Radiographs shows stable alignment of the left total hip arthroplasty.

## 2013-05-27 NOTE — Progress Notes (Signed)
UR COMPLETED  

## 2013-05-27 NOTE — Evaluation (Signed)
Occupational Therapy Evaluation Patient Details Name: Alfred Carney MRN: [id number] DOB: December 18, 1950 Today's Date: 05/27/2013 Time: 5621-3086 OT Time Calculation (min): 32 min  OT Assessment / Plan / Recommendation History of present illness s/p posterior L THA with posterior hip precautions.   Clinical Impression   Pt admitted with above. Will continue to follow pt acutely in order to address below problem list in prep for return home with family.    OT Assessment  Patient needs continued OT Services    Follow Up Recommendations  Home health OT;Supervision/Assistance - 24 hour    Barriers to Discharge      Equipment Recommendations  None recommended by OT    Recommendations for Other Services    Frequency  Min 2X/week    Precautions / Restrictions Precautions Precautions: Posterior Hip Precaution Booklet Issued: Yes (comment) Precaution Comments: Pt able to recall 2/3 hip precautions independently at start of session. Reviewed all 3 precautions and provided handout. Restrictions Weight Bearing Restrictions: Yes LLE Weight Bearing: Weight bearing as tolerated   Pertinent Vitals/Pain Pt on 3L O2 nasal cannula fluctuating 80-94% at rest and with activity.    ADL  Eating/Feeding: Performed;Independent Where Assessed - Eating/Feeding: Edge of bed Upper Body Bathing: Simulated;Supervision/safety;Set up Where Assessed - Upper Body Bathing: Unsupported sitting Lower Body Bathing: Simulated;Maximal assistance Where Assessed - Lower Body Bathing: Unsupported sitting Upper Body Dressing: Simulated;Supervision/safety;Set up Where Assessed - Upper Body Dressing: Unsupported sitting Lower Body Dressing: Performed;+1 Total assistance Where Assessed - Lower Body Dressing: Unsupported sitting Toilet Transfer: Simulated;+2 Total assistance Toilet Transfer: Patient Percentage: 50% Toilet Transfer Method: Stand pivot Toilet Transfer Equipment:  (bed<>chair) Equipment Used: Gait  belt;Rolling walker Transfers/Ambulation Related to ADLs: +2 total assist for SPT from bed<>chair. ADL Comments: Reviewed posterior hip precautions and these affect ADL performance. Began discussing use of AE for LB bathing/dressing. Pt reports he has tried a sock aid before but had difficulty using it (could possibly benefit from wide sock aid).    OT Diagnosis: Generalized weakness;Acute pain  OT Problem List: Decreased strength;Decreased activity tolerance;Impaired balance (sitting and/or standing);Decreased knowledge of use of DME or AE;Decreased knowledge of precautions;Pain;Obesity OT Treatment Interventions: Self-care/ADL training;DME and/or AE instruction;Therapeutic activities;Patient/family education;Balance training   OT Goals(Current goals can be found in the care plan section) Acute Rehab OT Goals Patient Stated Goal: to return home OT Goal Formulation: With patient Time For Goal Achievement: 06/10/13 Potential to Achieve Goals: Good  Visit Information  Last OT Received On: 05/27/13 PT/OT Co-Evaluation/Treatment: Yes History of Present Illness: s/p posterior L THA with posterior hip precautions.       Prior Functioning     Home Living Family/patient expects to be discharged to:: Private residence Available Help at Discharge: Family;Available 24 hours/day Type of Home: House Home Access: Stairs to enter [employer] of Steps: 3 Entrance Stairs-Rails: None Home Layout: One level Home Equipment: Walker - 2 wheels;Bedside commode;Crutches;Cane - single point;Adaptive equipment Adaptive Equipment: Reacher Prior Function Level of Independence: Needs assistance Gait / Transfers Assistance Needed: Has been using cane prior to sx due to painful L hip. ADL's / Homemaking Assistance Needed: Wife assisting with donning socks. Communication Communication: No difficulties Dominant Hand: Right         Vision/Perception     Cognition   Cognition Arousal/Alertness: Awake/alert Behavior During Therapy: WFL for tasks assessed/performed Overall Cognitive Status: Within Functional Limits for tasks assessed    Extremity/Trunk Assessment Upper Extremity Assessment Upper Extremity Assessment: Overall WFL for tasks assessed  Mobility Bed Mobility Bed Mobility: Supine to Sit;Sitting - Scoot to Edge of Bed Supine to Sit: 1: +2 Total assist;HOB flat Supine to Sit: Patient Percentage: 50% Sitting - Scoot to Edge of Bed: 4: Min assist Details for Bed Mobility Assistance: Assist to help elevate trunk OOB. VCs for sequencing and technique. Transfers Transfers: Sit to Stand;Stand to Sit Sit to Stand: 1: +2 Total assist;From bed;With upper extremity assist;From elevated surface Sit to Stand: Patient Percentage: 50% Stand to Sit: 1: +2 Total assist;To chair/3-in-1;With armrests;With upper extremity assist Stand to Sit: Patient Percentage: 70% Details for Transfer Assistance: VCs for technique and hand placement. Assist for power up and for balance while transitioning UEs from bed to RW. Pt stood x 2 trials.  With first stand, pt c/o dizziness and buckled LLE. Returned pt to sitting EOB and moved recliner closer.  Pt stood second time with slight improvement and pivoted to chair with +2 total assist.     Exercise     Balance     End of Session OT - End of Session Equipment Utilized During Treatment: Gait belt;Rolling walker;Oxygen Activity Tolerance: Patient limited by fatigue Patient left: in chair;with call bell/phone within reach Nurse Communication: Mobility status  GO    05/27/2013 Cipriano Mile OTR/L Pager [phone] Office [phone]  [place], [place] 05/27/2013, 10:12 AM

## 2013-05-27 NOTE — Progress Notes (Signed)
05/27/13 Patient set up with HHPT with [facility] by MD office. Spoke with patient and his wife, no change in d/c plan. OT recommended, contacted Corrie Dandy at [facility] and set up [facility] as well as HHPT. [employer] providing 3N1.  Jacquelynn Cree RN, BSN, CCM

## 2013-05-28 ENCOUNTER — Encounter (HOSPITAL_COMMUNITY): Payer: Self-pay | Admitting: Orthopedic Surgery

## 2013-05-28 LAB — CBC
HCT: 34.2 % — ABNORMAL LOW (ref 39.0–52.0)
Hemoglobin: 11.5 g/dL — ABNORMAL LOW (ref 13.0–17.0)
MCH: 28.5 pg (ref 26.0–34.0)
MCHC: 33.6 g/dL (ref 30.0–36.0)
MCV: 84.9 fL (ref 78.0–100.0)
Platelets: 166 10*3/uL (ref 150–400)
RBC: 4.03 MIL/uL — ABNORMAL LOW (ref 4.22–5.81)
RDW: 15 % (ref 11.5–15.5)
WBC: 9 10*3/uL (ref 4.0–10.5)

## 2013-05-28 LAB — GLUCOSE, CAPILLARY
Glucose-Capillary: 149 mg/dL — ABNORMAL HIGH (ref 70–99)
Glucose-Capillary: 129 mg/dL — ABNORMAL HIGH (ref 70–99)
Glucose-Capillary: 116 mg/dL — ABNORMAL HIGH (ref 70–99)
Glucose-Capillary: 118 mg/dL — ABNORMAL HIGH (ref 70–99)

## 2013-05-28 LAB — BASIC METABOLIC PANEL
BUN: 15 mg/dL (ref 6–23)
CO2: 28 mEq/L (ref 19–32)
Calcium: 8.4 mg/dL (ref 8.4–10.5)
Chloride: 97 mEq/L (ref 96–112)
Creatinine, Ser: 1.17 mg/dL (ref 0.50–1.35)
GFR calc Af Amer: 75 mL/min — ABNORMAL LOW (ref >90)
GFR calc non Af Amer: 65 mL/min — ABNORMAL LOW (ref >90)
Glucose, Bld: 142 mg/dL — ABNORMAL HIGH (ref 70–99)
Potassium: 3.2 mEq/L — ABNORMAL LOW (ref 3.5–5.1)
Sodium: 134 mEq/L — ABNORMAL LOW (ref 135–145)

## 2013-05-28 NOTE — Progress Notes (Signed)
Physical Therapy Treatment Patient Details Name: Alfred Carney MRN: [id number] DOB: March 20, 1951 Today's Date: 05/28/2013 Time: 4098-1191 PT Time Calculation (min): 23 min  PT Assessment / Plan / Recommendation  History of Present Illness s/p posterior L THA with posterior hip precautions.   PT Comments   Pt's wife present and involved in pt's care.  She states she assisted him back to bed prior to PT session and without staff assist.  I educated pt's wife on all hip precautions and reviewed handout with her.  She seems familiar with this from pt's prior hip surgery by her report.  I also educated her on safe and appropriate steps negotiation technique and demo'd this in the PT gym.  Pt. Did not want to get back OOB for ambulation practice but was willing to do bed exercises.  Pt. And wife both believe they will manage well at home and do not believe that pt. Needs ST SNF for rehab.  I believe this is in his best interest but do believe wife is capable of assisting him appropriately.  Follow Up Recommendations  Home health PT;Supervision/Assistance - 24 hour;Supervision for mobility/OOB     Does the patient have the potential to tolerate intense rehabilitation     Barriers to Discharge        Equipment Recommendations  None recommended by PT    Recommendations for Other Services    Frequency 7X/week   Progress towards PT Goals Progress towards PT goals: Progressing toward goals  Plan Current plan remains appropriate    Precautions / Restrictions Precautions Precautions: Posterior Hip Precaution Booklet Issued: Yes (comment) Precaution Comments: Educated pt. on all 3 hip precautions. Reviewed precautions with pt.  Required Braces or Orthoses: Other Brace/Splint Other Brace/Splint: Pt. and wife instructed in use of thick bed pillow to keep legs separated in supine and especially lying on either side.  They verbalize necessity and understanding. Restrictions Weight Bearing Restrictions:  Yes LLE Weight Bearing: Weight bearing as tolerated Other Position/Activity Restrictions: bed pillow as abduction type pillow to maintain posterior hip precautions   Pertinent Vitals/Pain See vitals tab     Mobility  Bed Mobility Bed Mobility: Not assessed Transfers Transfers: Not assessed Ambulation/Gait Ambulation/Gait Assistance: Not tested (comment)    Exercises Total Joint Exercises Ankle Circles/Pumps: AROM;Both;10 reps Quad Sets: AROM;10 reps;Left Gluteal Sets: AROM;Left;Both Short Arc Quad: AROM;Left;10 reps;Supine Heel Slides: AAROM;Left;10 reps Hip ABduction/ADduction: AAROM;Left;10 reps   PT Diagnosis:    PT Problem List:   PT Treatment Interventions:     PT Goals (current goals can now be found in the care plan section) Acute Rehab PT Goals Patient Stated Goal: to return home  Visit Information  Last PT Received On: 05/28/13 Assistance Needed: +2 (for ambulation) History of Present Illness: s/p posterior L THA with posterior hip precautions.    Subjective Data  Subjective: Pt. reports he just got back to bed and prefers not to get back up for walking.  He was agreeable to in bed exercises.  Wife present and states she is comfortable in assisting pt. with mobility upon DC as she has done it in the past with his other hip surgery. Patient Stated Goal: to return home   Cognition  Cognition Arousal/Alertness:  (arouses easily but fatigued and sleepy) Behavior During Therapy: WFL for tasks assessed/performed Overall Cognitive Status: Within Functional Limits for tasks assessed    Balance     End of Session PT - End of Session Activity Tolerance: Patient limited by fatigue;Patient limited by  pain Patient left: in bed;with call bell/phone within reach;with family/visitor present Nurse Communication: Mobility status;Patient requests pain meds   GP     Ferman Hamming 05/28/2013, 3:43 PM [facility] Picking PT Acute Rehab Services [phone] [facility]  [phone]

## 2013-05-28 NOTE — Progress Notes (Signed)
Physical Therapy Treatment Patient Details Name: Alfred Carney MRN: [id number] DOB: 05-02-1951 Today's Date: 05/28/2013 Time: 7829-5621 PT Time Calculation (min): 23 min  PT Assessment / Plan / Recommendation  History of Present Illness s/p posterior L THA with posterior hip precautions.   PT Comments   Pt. Is making slower than desired progress and is limited by early fatigue and pain.  Discussed the possibility of ST SNF for rehab with this pt.  He phoned his wife for discussion of this issue.  I checked back with pt. And he says he and his wife think they can manage DC home without SNF for rehab.  My recommendation is ST SNf but since they currently decline, I did not change recommendation in recommendation section of this note.  Pt,. Says wife is to be here this afternoon.  I will attempt to see pt. This afternoon while wife is here.    Follow Up Recommendations  Home health PT;Supervision/Assistance - 24 hour;Supervision for mobility/OOB     Does the patient have the potential to tolerate intense rehabilitation     Barriers to Discharge        Equipment Recommendations  None recommended by PT    Recommendations for Other Services    Frequency 7X/week   Progress towards PT Goals Progress towards PT goals: Progressing toward goals  Plan Current plan remains appropriate (have recommended ST SNF to pt. but he declines)    Precautions / Restrictions Precautions Precautions: Posterior Hip Precaution Comments: Reviewed all precautions with patient Restrictions Weight Bearing Restrictions: Yes LLE Weight Bearing: Weight bearing as tolerated Other Position/Activity Restrictions: bed pillow as abduction type pillow to maintain posterior hip precautions   Pertinent Vitals/Pain See vitals tab;  Pt. On 2 L O2  At rest with sats 94%.  He was maintained on O2 during practice of steps.  Ending sats was 92% on 2L O2.  Needs to trial room air with activity as he is not on O2 at home.      Mobility  Bed Mobility Bed Mobility: Not assessed (pt. up in recliner chair) Transfers Transfers: Sit to Stand;Stand to Sit Sit to Stand: 4: Min guard;From chair/3-in-1;With armrests;With upper extremity assist Stand to Sit: 4: Min assist;With upper extremity assist;With armrests;To chair/3-in-1 Details for Transfer Assistance: Pt. managing sit to stand well at min guard assist level.  Needed min assist to control descent .  also needs cues for L LE placement prior to sitting.   Ambulation/Gait Ambulation/Gait Assistance: 4: Min assist;Other (comment) (second person for recliner due to early fatigue) Ambulation Distance (Feet): 25 Feet Assistive device: Rolling walker Ambulation/Gait Assistance Details: Pt. needed cues for technique and sequencing.  Fatigues quickly and needs several standing rest breaks during short walk on hallway.  Decreased safety as he nears his point of fatigue and needs to sit. Gait Pattern: Step-to pattern;Decreased step length - right;Decreased step length - left;Trunk flexed Gait velocity: decreased Stairs: Yes Stairs Assistance: 3: Mod assist;Other (comment) (pt. now reports his steps have 2 rails, can reach both) Stairs Assistance Details (indicate cue type and reason): mod assist needed for safety and stability on steps Stair Management Technique: Two rails;Step to pattern;Forwards Number of Stairs: 5    Exercises     PT Diagnosis:    PT Problem List:   PT Treatment Interventions:     PT Goals (current goals can now be found in the care plan section) Acute Rehab PT Goals Patient Stated Goal: to return home  Visit Information  Last PT Received On: 05/28/13 Assistance Needed: +2 (for ambulation) PT/OT Co-Evaluation/Treatment: Yes History of Present Illness: s/p posterior L THA with posterior hip precautions.    Subjective Data  Patient Stated Goal: to return home   Cognition  Cognition Arousal/Alertness: Awake/alert Behavior During Therapy:  WFL for tasks assessed/performed Overall Cognitive Status: Within Functional Limits for tasks assessed    Balance     End of Session PT - End of Session Equipment Utilized During Treatment: Gait belt;Oxygen Activity Tolerance: Patient limited by fatigue;Patient limited by pain Patient left: in chair;with call bell/phone within reach Nurse Communication: Mobility status;Precautions   GP     Ferman Hamming 05/28/2013, 10:41 AM [facility] Picking PT Acute Rehab Services [phone] [facility] [phone]

## 2013-05-28 NOTE — Progress Notes (Signed)
Patient ID: Alfred Carney, male   DOB: 02/28/1951, 62 y.o.   MRN: [id number] Postoperative day 2 total hip arthroplasty. Patient is progressing with therapy. Plan for discharge to home Saturday morning. DC IV today.

## 2013-05-28 NOTE — Progress Notes (Signed)
Occupational Therapy Treatment Patient Details Name: Alfred Carney MRN: [id number] DOB: 05-27-1951 Today's Date: 05/28/2013 Time: 7829-5621 OT Time Calculation (min): 17 min  OT Assessment / Plan / Recommendation  History of present illness s/p posterior L THA with posterior hip precautions.   OT comments  Pt making progress slowly.  Follow Up Recommendations  Home health OT;Supervision/Assistance - 24 hour       Equipment Recommendations  3 in 1 bedside comode       Frequency Min 2X/week   Progress towards OT Goals Progress towards OT goals: Progressing toward goals (slowly)  Plan Discharge plan remains appropriate (talked to him about possible SNF)    Precautions / Restrictions Precautions Precautions: Posterior Hip Precaution Comments: Reviewed all precautions with patient Restrictions Weight Bearing Restrictions: Yes LLE Weight Bearing: Weight bearing as tolerated Other Position/Activity Restrictions: bed pillow as abduction type pillow to maintain posterior hip precautions       ADL  Toilet Transfer: Min guard Toilet Transfer Method: Sit to Barista:  (recline>ambulate>recliner) Equipment Used: Gait belt;Rolling walker Transfers/Ambulation Related to ADLs: min guard A with RW ADL Comments: Pt reports that wife can A him with LBADLs--he does have sock aid and reacher at home      OT Goals(current goals can now be found in the care plan section) Acute Rehab OT Goals Patient Stated Goal: to return home  Visit Information  Last OT Received On: 05/28/13 Assistance Needed: +2 PT/OT Co-Evaluation/Treatment: Yes History of Present Illness: s/p posterior L THA with posterior hip precautions.          Cognition  Cognition Arousal/Alertness: Awake/alert Behavior During Therapy: WFL for tasks assessed/performed Overall Cognitive Status: Within Functional Limits for tasks assessed    Mobility  Bed Mobility Bed Mobility: Not assessed (pt. up  in recliner chair) Transfers Sit to Stand: 4: Min guard;From chair/3-in-1;With armrests;With upper extremity assist Stand to Sit: 4: Min assist;With upper extremity assist;With armrests;To chair/3-in-1 Details for Transfer Assistance: Pt. managing sit to stand well at min guard assist level.  Needed min assist to control descent .  also needs cues for L LE placement prior to sitting.            End of Session OT - End of Session Equipment Utilized During Treatment: Gait belt;Rolling walker;Oxygen Activity Tolerance: Patient limited by fatigue Patient left: in chair (with PT)       Evette Georges [phone] 05/28/2013, 1:43 PM

## 2013-05-28 NOTE — Progress Notes (Signed)
Patient with sats dropping into the mid 80's without O2.  Patient has been on O2 entire stay, does not wear O2 at home.  Dr. Lajoyce Corners aware, encourage incentive spirometer.

## 2013-05-29 LAB — BASIC METABOLIC PANEL
BUN: 18 mg/dL (ref 6–23)
CO2: 30 mEq/L (ref 19–32)
Calcium: 9 mg/dL (ref 8.4–10.5)
Chloride: 93 mEq/L — ABNORMAL LOW (ref 96–112)
Creatinine, Ser: 1.16 mg/dL (ref 0.50–1.35)
GFR calc Af Amer: 76 mL/min — ABNORMAL LOW (ref >90)
GFR calc non Af Amer: 65 mL/min — ABNORMAL LOW (ref >90)
Glucose, Bld: 140 mg/dL — ABNORMAL HIGH (ref 70–99)
Potassium: 4.5 mEq/L (ref 3.5–5.1)
Sodium: 134 mEq/L — ABNORMAL LOW (ref 135–145)

## 2013-05-29 LAB — CBC
HCT: 37 % — ABNORMAL LOW (ref 39.0–52.0)
Hemoglobin: 12.1 g/dL — ABNORMAL LOW (ref 13.0–17.0)
MCH: 28.1 pg (ref 26.0–34.0)
MCHC: 32.7 g/dL (ref 30.0–36.0)
MCV: 86 fL (ref 78.0–100.0)
Platelets: 196 10*3/uL (ref 150–400)
RBC: 4.3 MIL/uL (ref 4.22–5.81)
RDW: 15.1 % (ref 11.5–15.5)
WBC: 11.5 10*3/uL — ABNORMAL HIGH (ref 4.0–10.5)

## 2013-05-29 LAB — GLUCOSE, CAPILLARY: Glucose-Capillary: 129 mg/dL — ABNORMAL HIGH (ref 70–99)

## 2013-05-29 MED ORDER — OXYCODONE-ACETAMINOPHEN 5-325 MG PO TABS: 1.0000 | ORAL_TABLET | ORAL | Status: DC | PRN

## 2013-05-29 MED ORDER — ASPIRIN EC 325 MG PO TBEC: 325.0000 mg | DELAYED_RELEASE_TABLET | Freq: Every day | ORAL | Status: DC

## 2013-05-29 NOTE — Discharge Summary (Signed)
Physician Discharge Summary  Patient ID: Alfred Carney MRN: [id number] DOB/AGE: 63-Nov-1952 63 y.o.  Admit date: 05/26/2013 Discharge date: 05/29/2013  Admission Diagnoses: Avascular necrosis left hip  Discharge Diagnoses: Avascular necrosis left hip Active Problems:   * No active hospital problems. *   Discharged Condition: stable  Hospital Course: Patient's hospital course was essentially unremarkable. He underwent left total hip arthroplasty. Postoperatively patient progressed slowly with therapy and patient wished to be discharged to home.  Consults: None  Significant Diagnostic Studies: labs: Routine labs  Treatments: surgery: See operative note  Discharge Exam: Blood pressure 129/65, pulse 77, temperature 97.9 F (36.6 C), temperature source Oral, resp. rate 16, SpO2 100.00%. Incision/Wound: dressing clean dry and intact  Disposition:   Discharge Orders   Future Orders Complete By Expires   Call MD / Call 911  As directed    Comments:     If you experience chest pain or shortness of breath, CALL 911 and be transported to the hospital emergency room.  If you develope a fever above 101 F, pus (white drainage) or increased drainage or redness at the wound, or calf pain, call your surgeon's office.   Constipation Prevention  As directed    Comments:     Drink plenty of fluids.  Prune juice may be helpful.  You may use a stool softener, such as Colace (over the counter) 100 mg twice a day.  Use MiraLax (over the counter) for constipation as needed.   Diet - low sodium heart healthy  As directed    Increase activity slowly as tolerated  As directed        Medication List         aspirin EC 325 MG tablet  Take 1 tablet (325 mg total) by mouth daily.     aspirin 81 MG tablet  Take 81 mg by mouth daily.     hydrochlorothiazide 25 MG tablet  Commonly known as:  HYDRODIURIL  Take 25 mg by mouth daily.     ibuprofen 200 MG tablet  Commonly known as:  ADVIL,MOTRIN  Take  200-400 mg by mouth every 6 (six) hours as needed for pain.     lisinopril 5 MG tablet  Commonly known as:  PRINIVIL,ZESTRIL  Take 5 mg by mouth daily.     metoprolol succinate 25 MG 24 hr tablet  Commonly known as:  TOPROL-XL  Take 25 mg by mouth daily.     omeprazole 20 MG capsule  Commonly known as:  PRILOSEC  Take 20 mg by mouth daily.     oxyCODONE-acetaminophen 5-325 MG per tablet  Commonly known as:  ROXICET  Take 1 tablet by mouth every 4 (four) hours as needed for pain.     oxyCODONE-acetaminophen 10-325 MG per tablet  Commonly known as:  PERCOCET  Take 1 tablet by mouth every 4 (four) hours as needed for pain.     rosuvastatin 10 MG tablet  Commonly known as:  CRESTOR  Take 1 tablet (10 mg total) by mouth at bedtime.           Follow-up Information   Follow up with Falicia Lizotte V, MD In 2 weeks. [facility] [phone]  home therapy, they will contact you)    Specialty:  Orthopedic Surgery   Contact information:   [address] [place] [address] [phone]       Signed: Nadara Mustard 05/29/2013, 12:51 PM

## 2013-05-29 NOTE — Progress Notes (Addendum)
Occupational Therapy Treatment Patient Details Name: Alfred Carney MRN: [id number] DOB: 12-02-1950 Today's Date: 05/29/2013 Time: 9629-5284 OT Time Calculation (min): 29 min  OT Assessment / Plan / Recommendation  History of present illness s/p posterior L THA with posterior hip precautions.   OT comments  OT educated on AE for LB ADLs and technique. Discussed shower transfer and could not come up with solution that wife thinks will work with home setup, so leaving it to [facility] to practice with pt/wife. Pt became very fatigued during session and O2 in 80's- nurse notified.   Follow Up Recommendations  Home health OT;Supervision/Assistance - 24 hour    Barriers to Discharge       Equipment Recommendations  3 in 1 bedside comode    Recommendations for Other Services    Frequency Min 2X/week   Progress towards OT Goals Progress towards OT goals: Progressing toward goals  Plan Discharge plan remains appropriate    Precautions / Restrictions Precautions Precautions: Posterior Hip Precaution Booklet Issued: No Precaution Comments: Reviewed precautions with pt. Only able to state 1/3 at beginning of session, but did better towards end of session. Restrictions Weight Bearing Restrictions: Yes LLE Weight Bearing: Weight bearing as tolerated   Pertinent Vitals/Pain O2 in 80's during session-educated on deep breathing. Nurse notified. Pain 6/10. Repositioned.     ADL  Toilet Transfer: Hydrographic surveyor Method: Sit to Barista: Other (comment);Extra wide bedside commode (recliner chair and bed) Toileting - Clothing Manipulation and Hygiene: Other (comment) (see ADL section) Equipment Used: Gait belt;Rolling walker Transfers/Ambulation Related to ADLs: Min guard for ambulation and transfers. ADL Comments: OT discussed shower and was brainstorming ideas that may would work with shower, but shower is small and wife does not think walker and a chair will fit. OT  recommended HH covering this and looking at shower in person to see if any way will work. Told pt and wife to wait until [facility] addresses this before they try it and that pt may have to sponge bathe. OT demonstrated use of reacher for LB ADLs and long handled sponge. Wife will be available to assist as needed, but they have reacher at home and pt wanted OT to show him. Educated on safe dressing technique and also safe shoes. Educated on use of bag on walker to carry items and also to have rugs picked up in house.  Pt's wife felt fine about bed mobility.  Pt ambulated short distance in hallway and became very fatigued. Had wife follow with chair for safety.  Had pt simulate performing hygiene while standing and he was unable to reach fully to perform hygiene- OT talked with pt and spouse about toilet tongs to assist with this.     OT Diagnosis:    OT Problem List:   OT Treatment Interventions:     OT Goals(current goals can now be found in the care plan section) Acute Rehab OT Goals Patient Stated Goal: not stated; previously was to return home OT Goal Formulation: With patient Time For Goal Achievement: 06/10/13 Potential to Achieve Goals: Good ADL Goals Pt Will Perform Lower Body Bathing: with set-up;with supervision;sit to/from stand;with adaptive equipment Pt Will Perform Lower Body Dressing: with min assist;with adaptive equipment;sit to/from stand Pt Will Transfer to Toilet: with supervision;ambulating (3 in 1 over toilet) Pt Will Perform Toileting - Clothing Manipulation and hygiene: with supervision;sit to/from stand Pt Will Perform Tub/Shower Transfer: Shower transfer;with supervision;3 in 1;rolling walker;ambulating Additional ADL Goal #1: Pt will  perform bed mobility at supervision level as precursor for EOB ADLs. Additional ADL Goal #2: Pt will independently verbalize and generalize 3/3 posterior hip precautions during all ADL activity.  Visit Information  Last OT Received On:  05/29/13 Assistance Needed: +1 History of Present Illness: s/p posterior L THA with posterior hip precautions.    Subjective Data      Prior Functioning       Cognition  Cognition Arousal/Alertness: Awake/alert Behavior During Therapy: WFL for tasks assessed/performed Overall Cognitive Status: Within Functional Limits for tasks assessed    Mobility  Bed Mobility Bed Mobility: Not assessed Transfers Transfers: Sit to Stand;Stand to Sit Sit to Stand: 4: Min guard;With upper extremity assist;From bed; From chair/3 in 1; With armrests Stand to Sit: With upper extremity assist;With armrests;To chair/3-in-1; To bed; 4: Min guard Details for Transfer Assistance: Cues for positioning of LLE and also hand placement.        Balance     End of Session OT - End of Session Equipment Utilized During Treatment: Gait belt;Rolling walker Activity Tolerance: Patient limited by fatigue Patient left: in chair;with family/visitor present Nurse Communication: Other (comment) (O2 sats)  GO     Earlie Raveling OTR/L [phone] 05/29/2013, 1:22 PM

## 2013-05-29 NOTE — Progress Notes (Signed)
Physical Therapy Treatment Patient Details Name: Alfred Carney MRN: [id number] DOB: Apr 28, 1951 Today's Date: 05/29/2013 Time: 7829-5621 PT Time Calculation (min): 20 min  PT Assessment / Plan / Recommendation  History of Present Illness s/p posterior L THA with posterior hip precautions.   PT Comments   Patient progressing with ambulation this morning. Continues to be limited by overall fatigue and deconditioning. Will attempt steps later today in hopes that patient can go home later this afternoon per his request. Patient does have adequate assistance from his wife   Follow Up Recommendations  Home health PT;Supervision/Assistance - 24 hour;Supervision for mobility/OOB     Does the patient have the potential to tolerate intense rehabilitation     Barriers to Discharge        Equipment Recommendations  None recommended by PT    Recommendations for Other Services    Frequency 7X/week   Progress towards PT Goals Progress towards PT goals: Progressing toward goals  Plan Current plan remains appropriate    Precautions / Restrictions Precautions Precautions: Posterior Hip Precaution Comments: Patient able to recall precautions Restrictions LLE Weight Bearing: Weight bearing as tolerated   Pertinent Vitals/Pain 3/10 L hip pain. patient repositioned for comfort    Mobility  Bed Mobility Supine to Sit: 4: Min guard;With rails Sitting - Scoot to Edge of Bed: 4: Min guard Details for Bed Mobility Assistance: Patient able to get to EOB without assistance but does rely on rails. Will have assistance from his wife at home Transfers Sit to Stand: 4: Min guard;With upper extremity assist;From bed Stand to Sit: With upper extremity assist;With armrests;To chair/3-in-1;4: Min guard Details for Transfer Assistance: Patietn able to stand well from bed. Needs cues to sit slowly back into recliner Ambulation/Gait Ambulation/Gait Assistance: 4: Min guard Ambulation Distance (Feet): 80  Feet Assistive device: Rolling walker Ambulation/Gait Assistance Details: Cues for upright posture and to "smooth" out gait vs. taking quick choppy steps Gait Pattern: Step-to pattern;Decreased step length - right;Decreased step length - left    Exercises Total Joint Exercises Quad Sets: AROM;10 reps;Left Gluteal Sets: AROM;Left;Both Short Arc Quad: AROM;Left;10 reps;Supine Heel Slides: AAROM;Left;10 reps Hip ABduction/ADduction: AAROM;Left;10 reps Long Arc Quad: AROM;Left;Seated;10 reps   PT Diagnosis:    PT Problem List:   PT Treatment Interventions:     PT Goals (current goals can now be found in the care plan section)    Visit Information  Last PT Received On: 05/29/13 Assistance Needed: +1 History of Present Illness: s/p posterior L THA with posterior hip precautions.    Subjective Data      Cognition  Cognition Arousal/Alertness: Awake/alert Behavior During Therapy: WFL for tasks assessed/performed Overall Cognitive Status: Within Functional Limits for tasks assessed    Balance     End of Session PT - End of Session Activity Tolerance: Patient tolerated treatment well;Patient limited by fatigue Patient left: in chair;with call bell/phone within reach   GP     Fredrich Birks 05/29/2013, 9:20 AM 05/29/2013 Fredrich Birks PTA [phone] pager [phone] office

## 2013-05-29 NOTE — Progress Notes (Signed)
Patient with continued O2 sats in the mid to high 80's on room air.  Dr Lajoyce Corners aware, no new orders.  Patient d/c to home without issues, IV removed prior to this start of shift and instructions reviewed with patient and wife and prescription given.  DME and home health set up.

## 2013-05-29 NOTE — Progress Notes (Signed)
Physical Therapy Treatment Patient Details Name: Alfred Carney MRN: [id number] DOB: Sep 02, 1951 Today's Date: 05/29/2013 Time: 4098-1191 PT Time Calculation (min): 12 min  PT Assessment / Plan / Recommendation  History of Present Illness s/p posterior L THA with posterior hip precautions.   PT Comments   Patient able to complete stair training again this afternoon and did well. Anticipate DC later today  Follow Up Recommendations  Home health PT;Supervision/Assistance - 24 hour;Supervision for mobility/OOB     Does the patient have the potential to tolerate intense rehabilitation     Barriers to Discharge        Equipment Recommendations  None recommended by PT    Recommendations for Other Services    Frequency 7X/week   Progress towards PT Goals Progress towards PT goals: Progressing toward goals  Plan Current plan remains appropriate    Precautions / Restrictions Precautions Precautions: Posterior Hip Precaution Comments: Patient able to recall precautions Restrictions LLE Weight Bearing: Weight bearing as tolerated   Pertinent Vitals/Pain no apparent distress Patient nauseated. rn aware    Mobility  Bed Mobility Supine to Sit: 4: Min guard;With rails Sitting - Scoot to Edge of Bed: 4: Min guard Details for Bed Mobility Assistance: Patient able to get to EOB without assistance but does rely on rails. Will have assistance from his wife at home Transfers Sit to Stand: 4: Min guard;With upper extremity assist;From bed Stand to Sit: With upper extremity assist;With armrests;To chair/3-in-1;4: Min guard Details for Transfer Assistance: Patietn able to stand well from bed. Needs cues to sit slowly back into recliner Ambulation/Gait Ambulation/Gait Assistance: 4: Min guard Ambulation Distance (Feet): 100 Feet Assistive device: Rolling walker Ambulation/Gait Assistance Details: Cues for upright posture and to "smooth" out gait vs. taking quick choppy steps Gait Pattern:  Step-to pattern;Decreased step length - right;Decreased step length - left Stairs Assistance: 4: Min guard Stairs Assistance Details (indicate cue type and reason): Patient able to recall technique and able to ensure his balance and stability throughout Number of Stairs: 5    Exercises Total Joint Exercises Quad Sets: AROM;10 reps;Left Gluteal Sets: AROM;Left;Both Short Arc Quad: AROM;Left;10 reps;Supine Heel Slides: AAROM;Left;10 reps Hip ABduction/ADduction: AAROM;Left;10 reps Long Arc Quad: AROM;Left;Seated;10 reps   PT Diagnosis:    PT Problem List:   PT Treatment Interventions:     PT Goals (current goals can now be found in the care plan section)    Visit Information  Last PT Received On: 05/29/13 Assistance Needed: +1 History of Present Illness: s/p posterior L THA with posterior hip precautions.    Subjective Data      Cognition  Cognition Arousal/Alertness: Awake/alert Behavior During Therapy: WFL for tasks assessed/performed Overall Cognitive Status: Within Functional Limits for tasks assessed    Balance     End of Session PT - End of Session Equipment Utilized During Treatment: Gait belt Activity Tolerance: Patient tolerated treatment well Patient left: in chair;with call bell/phone within reach Nurse Communication: Mobility status   GP     Fredrich Birks 05/29/2013, 1:05 PM 05/29/2013 Fredrich Birks PTA [phone] pager [phone] office

## 2014-01-28 ENCOUNTER — Encounter (HOSPITAL_COMMUNITY): Payer: Self-pay | Admitting: *Deleted

## 2014-01-28 ENCOUNTER — Ambulatory Visit (INDEPENDENT_AMBULATORY_CARE_PROVIDER_SITE_OTHER): Payer: BC Managed Care – HMO | Admitting: Physician Assistant

## 2014-01-28 ENCOUNTER — Inpatient Hospital Stay (HOSPITAL_COMMUNITY)
Admission: AD | Admit: 2014-01-28 | Discharge: 2014-01-31 | DRG: 287 | Disposition: A | Discharge status: Home / Self Care | Payer: BC Managed Care – HMO | Source: Ambulatory Visit | Attending: Cardiology | Admitting: Cardiology

## 2014-01-28 ENCOUNTER — Encounter: Payer: Self-pay | Admitting: Physician Assistant

## 2014-01-28 ENCOUNTER — Inpatient Hospital Stay (HOSPITAL_COMMUNITY): Payer: BC Managed Care – HMO

## 2014-01-28 ENCOUNTER — Encounter: Payer: Self-pay | Admitting: Cardiovascular Disease

## 2014-01-28 VITALS — BP 130/70 | HR 57 | Ht 69.5 in | Wt 315.8 lb

## 2014-01-28 DIAGNOSIS — I2510 Atherosclerotic heart disease of native coronary artery without angina pectoris: Secondary | ICD-10-CM

## 2014-01-28 DIAGNOSIS — R079 Chest pain, unspecified: Secondary | ICD-10-CM | POA: Diagnosis present

## 2014-01-28 DIAGNOSIS — J4489 Other specified chronic obstructive pulmonary disease: Secondary | ICD-10-CM

## 2014-01-28 DIAGNOSIS — I200 Unstable angina: Secondary | ICD-10-CM | POA: Diagnosis present

## 2014-01-28 DIAGNOSIS — J449 Chronic obstructive pulmonary disease, unspecified: Secondary | ICD-10-CM | POA: Diagnosis present

## 2014-01-28 DIAGNOSIS — Z7982 Long term (current) use of aspirin: Secondary | ICD-10-CM

## 2014-01-28 DIAGNOSIS — Z87891 Personal history of nicotine dependence: Secondary | ICD-10-CM

## 2014-01-28 DIAGNOSIS — N189 Chronic kidney disease, unspecified: Secondary | ICD-10-CM | POA: Diagnosis present

## 2014-01-28 DIAGNOSIS — Z8679 Personal history of other diseases of the circulatory system: Secondary | ICD-10-CM

## 2014-01-28 DIAGNOSIS — G4733 Obstructive sleep apnea (adult) (pediatric): Secondary | ICD-10-CM | POA: Diagnosis present

## 2014-01-28 DIAGNOSIS — I739 Peripheral vascular disease, unspecified: Secondary | ICD-10-CM

## 2014-01-28 DIAGNOSIS — I10 Essential (primary) hypertension: Secondary | ICD-10-CM

## 2014-01-28 DIAGNOSIS — E785 Hyperlipidemia, unspecified: Secondary | ICD-10-CM | POA: Diagnosis present

## 2014-01-28 DIAGNOSIS — I129 Hypertensive chronic kidney disease with stage 1 through stage 4 chronic kidney disease, or unspecified chronic kidney disease: Secondary | ICD-10-CM | POA: Diagnosis present

## 2014-01-28 DIAGNOSIS — Z6841 Body Mass Index (BMI) 40.0 and over, adult: Secondary | ICD-10-CM

## 2014-01-28 DIAGNOSIS — Z791 Long term (current) use of non-steroidal anti-inflammatories (NSAID): Secondary | ICD-10-CM

## 2014-01-28 DIAGNOSIS — K219 Gastro-esophageal reflux disease without esophagitis: Secondary | ICD-10-CM | POA: Diagnosis present

## 2014-01-28 DIAGNOSIS — I714 Abdominal aortic aneurysm, without rupture, unspecified: Secondary | ICD-10-CM

## 2014-01-28 HISTORY — DX: Emphysema, unspecified: J43.9

## 2014-01-28 LAB — CBC WITH DIFFERENTIAL/PLATELET
Basophils Absolute: 0 10*3/uL (ref 0.0–0.1)
Basophils Relative: 0 % (ref 0–1)
Eosinophils Absolute: 0.1 10*3/uL (ref 0.0–0.7)
Eosinophils Relative: 1 % (ref 0–5)
HCT: 46.7 % (ref 39.0–52.0)
Hemoglobin: 14.8 g/dL (ref 13.0–17.0)
Lymphocytes Relative: 27 % (ref 12–46)
Lymphs Abs: 2.1 10*3/uL (ref 0.7–4.0)
MCH: 26.6 pg (ref 26.0–34.0)
MCHC: 31.7 g/dL (ref 30.0–36.0)
MCV: 84 fL (ref 78.0–100.0)
Monocytes Absolute: 0.6 10*3/uL (ref 0.1–1.0)
Monocytes Relative: 7 % (ref 3–12)
Neutro Abs: 5.2 10*3/uL (ref 1.7–7.7)
Neutrophils Relative %: 65 % (ref 43–77)
Platelets: 180 10*3/uL (ref 150–400)
RBC: 5.56 MIL/uL (ref 4.22–5.81)
RDW: 16 % — ABNORMAL HIGH (ref 11.5–15.5)
WBC: 8 10*3/uL (ref 4.0–10.5)

## 2014-01-28 LAB — TROPONIN I
Troponin I: <0.3 ng/mL (ref <0.30)
Troponin I: <0.3 ng/mL (ref <0.30)
Troponin I: <0.3 ng/mL (ref <0.30)

## 2014-01-28 LAB — COMPREHENSIVE METABOLIC PANEL
ALT: 21 U/L (ref 0–53)
AST: 21 U/L (ref 0–37)
Albumin: 3.9 g/dL (ref 3.5–5.2)
Alkaline Phosphatase: 72 U/L (ref 39–117)
BUN: 19 mg/dL (ref 6–23)
CO2: 24 mEq/L (ref 19–32)
Calcium: 9.7 mg/dL (ref 8.4–10.5)
Chloride: 103 mEq/L (ref 96–112)
Creatinine, Ser: 1.14 mg/dL (ref 0.50–1.35)
GFR calc Af Amer: 77 mL/min — ABNORMAL LOW (ref >90)
GFR calc non Af Amer: 66 mL/min — ABNORMAL LOW (ref >90)
Glucose, Bld: 108 mg/dL — ABNORMAL HIGH (ref 70–99)
Potassium: 4.2 mEq/L (ref 3.7–5.3)
Sodium: 144 mEq/L (ref 137–147)
Total Bilirubin: 0.6 mg/dL (ref 0.3–1.2)
Total Protein: 7.4 g/dL (ref 6.0–8.3)

## 2014-01-28 LAB — GLUCOSE, CAPILLARY
Glucose-Capillary: 100 mg/dL — ABNORMAL HIGH (ref 70–99)
Glucose-Capillary: 98 mg/dL (ref 70–99)
Glucose-Capillary: 109 mg/dL — ABNORMAL HIGH (ref 70–99)

## 2014-01-28 LAB — APTT: aPTT: 35 seconds (ref 24–37)

## 2014-01-28 LAB — PROTIME-INR
INR: 1.07 (ref 0.00–1.49)
Prothrombin Time: 13.7 seconds (ref 11.6–15.2)

## 2014-01-28 LAB — TSH: TSH: 2.02 u[IU]/mL (ref 0.350–4.500)

## 2014-01-28 LAB — PRO B NATRIURETIC PEPTIDE: Pro B Natriuretic peptide (BNP): 59.1 pg/mL (ref 0–125)

## 2014-01-28 MED ORDER — ROSUVASTATIN CALCIUM 10 MG PO TABS
10.0000 mg | ORAL_TABLET | Freq: Every day | ORAL | Status: DC
  Administered 2014-01-28: 10 mg via ORAL
  Filled 2014-01-28 (×2): qty 1

## 2014-01-28 MED ORDER — SODIUM CHLORIDE 0.9 % IV SOLN: 250.0000 mL | INTRAVENOUS | Status: DC | PRN

## 2014-01-28 MED ORDER — PANTOPRAZOLE SODIUM 40 MG PO TBEC
40.0000 mg | DELAYED_RELEASE_TABLET | Freq: Every day | ORAL | Status: DC
  Administered 2014-01-28 – 2014-01-31 (×4): 40 mg via ORAL
  Filled 2014-01-28 (×4): qty 1

## 2014-01-28 MED ORDER — NITROGLYCERIN 0.4 MG SL SUBL: 0.4000 mg | SUBLINGUAL_TABLET | SUBLINGUAL | Status: DC | PRN

## 2014-01-28 MED ORDER — METOPROLOL SUCCINATE ER 25 MG PO TB24
25.0000 mg | ORAL_TABLET | Freq: Every day | ORAL | Status: DC
  Administered 2014-01-28 – 2014-01-31 (×4): 25 mg via ORAL
  Filled 2014-01-28 (×4): qty 1

## 2014-01-28 MED ORDER — NITROGLYCERIN IN D5W 200-5 MCG/ML-% IV SOLN
3.0000 ug/min | INTRAVENOUS | Status: DC
  Administered 2014-01-28: 3 ug/min via INTRAVENOUS
  Filled 2014-01-28: qty 250

## 2014-01-28 MED ORDER — LISINOPRIL 5 MG PO TABS
5.0000 mg | ORAL_TABLET | Freq: Every day | ORAL | Status: DC
  Administered 2014-01-28 – 2014-01-31 (×4): 5 mg via ORAL
  Filled 2014-01-28 (×4): qty 1

## 2014-01-28 MED ORDER — IOHEXOL 350 MG/ML SOLN
100.0000 mL | Freq: Once | INTRAVENOUS | Status: AC | PRN
  Administered 2014-01-28: 100 mL via INTRAVENOUS

## 2014-01-28 MED ORDER — ACETAMINOPHEN 325 MG PO TABS
650.0000 mg | ORAL_TABLET | Freq: Four times a day (QID) | ORAL | Status: DC | PRN
  Administered 2014-01-28 – 2014-01-30 (×3): 650 mg via ORAL
  Filled 2014-01-28 (×3): qty 2

## 2014-01-28 MED ORDER — ATORVASTATIN CALCIUM 20 MG PO TABS
20.0000 mg | ORAL_TABLET | Freq: Every day | ORAL | Status: DC
  Filled 2014-01-28: qty 1

## 2014-01-28 MED ORDER — SODIUM CHLORIDE 0.9 % IJ SOLN: 3.0000 mL | INTRAMUSCULAR | Status: DC | PRN

## 2014-01-28 MED ORDER — SODIUM CHLORIDE 0.9 % IJ SOLN
3.0000 mL | Freq: Two times a day (BID) | INTRAMUSCULAR | Status: DC
  Administered 2014-01-28 – 2014-01-30 (×2): 3 mL via INTRAVENOUS

## 2014-01-28 MED ORDER — ASPIRIN EC 81 MG PO TBEC
81.0000 mg | DELAYED_RELEASE_TABLET | Freq: Every day | ORAL | Status: DC
  Administered 2014-01-29 – 2014-01-31 (×3): 81 mg via ORAL
  Filled 2014-01-28 (×5): qty 1

## 2014-01-28 MED ORDER — HYDROCHLOROTHIAZIDE 25 MG PO TABS
25.0000 mg | ORAL_TABLET | Freq: Every day | ORAL | Status: DC
  Administered 2014-01-28 – 2014-01-31 (×4): 25 mg via ORAL
  Filled 2014-01-28 (×4): qty 1

## 2014-01-28 MED ORDER — HEPARIN BOLUS VIA INFUSION
4000.0000 [IU] | Freq: Once | INTRAVENOUS | Status: AC
  Administered 2014-01-28: 4000 [IU] via INTRAVENOUS
  Filled 2014-01-28: qty 4000

## 2014-01-28 MED ORDER — HEPARIN (PORCINE) IN NACL 100-0.45 UNIT/ML-% IJ SOLN
1400.0000 [IU]/h | INTRAMUSCULAR | Status: DC
  Administered 2014-01-28 – 2014-01-31 (×4): 1400 [IU]/h via INTRAVENOUS
  Filled 2014-01-28 (×5): qty 250

## 2014-01-28 NOTE — H&P (Signed)
Patient reviewed with Richardson Dopp PA. Agree with indication for admission. With pleuritic component of pain will get CT scan to r/o PE. If negative may need to consider coronary angiogram.  Dyquan Minks [place] MD, [facility]

## 2014-01-28 NOTE — Progress Notes (Addendum)
ANTICOAGULATION CONSULT NOTE - Initial Consult  Pharmacy Consult for Heparin Indication: chest pain/ACS  No Known Allergies  Patient Measurements: Height: 5' 9.69" (177 cm) Weight: 315 lb 11.2 oz (143.2 kg) IBW/kg (Calculated) : 72.28  1.25x ibw = 90 kg Heparin Dosing Weight: 105 kg Vital Signs: Temp: 98 F (36.7 C) (05/15 1322) Temp src: Oral (05/15 1322) BP: 115/59 mmHg (05/15 1639) Pulse Rate: 53 (05/15 1639)  Labs:  Recent Labs  01/28/14 1030 01/28/14 1225 01/28/14 1634  HGB 14.8  --   --   HCT 46.7  --   --   PLT 180  --   --   APTT 35  --   --   LABPROT 13.7  --   --   INR 1.07  --   --   CREATININE 1.14  --   --   TROPONINI  --  <0.30 <0.30    Estimated Creatinine Clearance: 93.2 ml/min (by C-G formula based on Cr of 1.14).   Medical History: Past Medical History  Diagnosis Date  . Morbid obesity   . Chronic obstructive pulmonary disease 2008    Moderate  . Community acquired pneumonia   . Hypertension   . Hyperlipidemia   . Degenerative joint disease   . Coronary artery disease   . Gastroesophageal reflux disease   . 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 Hypokalemia     diuretic induced, resolved  . Shortness of breath   . Gout   . Colon polyps   . H/O hiatal hernia   . Cancer     skin cancer  . Enlarged liver     fatty liver by '09 CT  . Sleep apnea     not currently using CPAP 05/20/13  . Emphysema lung     Medications:  Prescriptions prior to admission  Medication Sig Dispense Refill  . aspirin 81 MG tablet Take 81 mg by mouth daily.       . hydrochlorothiazide (HYDRODIURIL) 25 MG tablet Take 25 mg by mouth daily.      Marland Kitchen ibuprofen (ADVIL,MOTRIN) 200 MG tablet Take 200-400 mg by mouth every 6 (six) hours as needed for pain.      Marland Kitchen lisinopril (PRINIVIL,ZESTRIL) 5 MG tablet Take 5 mg by mouth daily.      .  metoprolol succinate (TOPROL-XL) 25 MG 24 hr tablet Take 25 mg by mouth daily.      Marland Kitchen omeprazole (PRILOSEC) 20 MG capsule Take 20 mg by mouth daily.      . rosuvastatin (CRESTOR) 10 MG tablet Take 10 mg by mouth daily.        Assessment: 63 yo Carney admitted 01/28/2014 with chest pain.  Pharmacy consulted to start heparin for r/o ACS after CT complete& demonstrating no evidence of aortic aneurysm or dissection.    PMH CAD, hx PVD s/p repair of thracoabdominal aneurysm, HTN, HLD, COPD, Obesity, OSA [doesn't use CPAP], EF 60%  Coag: ACS, Heparin dosing wt = 105kg, CBC stable, no bleeding noted, CT negative.  Goal of Therapy:  Heparin level 0.3-0.7 units/ml Monitor platelets by anticoagulation protocol: Yes   Plan:  1. Heparin 4000 units IV x 1 then 1400 units/hr 2. Check heparin level 6h after ggt started 3. Daily heparin level and CBC  Thank you for allowing pharmacy to be a part of this patients  care team.  Rowe Robert Pharm.D., BCPS, AQ-Cardiology Clinical Pharmacist 01/28/2014 5:59 PM Pager: [phone] Phone: [phone]

## 2014-01-28 NOTE — Progress Notes (Signed)
[place], [place], [place]  [address] Phone: [phone] Fax:  [phone]  Date:  01/28/2014   ID:  Gerlene Fee, DOB [birth year], MRN [id number]  PCP:  Helane Rima, MD  Cardiologist:  Dr. Peter Martinique      History of Present Illness: Alfred Carney is a 63 y.o. male with a hx of CAD, s/p cardiac catheterization in 2007 showing a 60-70% mid right coronary stenosis. This was unchanged compared to a prior study in 2004. He has a history of peripheral vascular disease and is status post repair of a thoracoabdominal aneurysm with right femoral and left iliac bypass grafting and reimplantation of his renal arteries.  Other hx includes HTN, HL, COPD, obesity.  Lipitor has caused him to have palpitations in the past. He does have a history of obstructive sleep apnea but hasn't used his CPAP mask in the past.  Last seen by Dr. Peter Martinique in 10/2011.  Myoview at that time was normal.    He presents today for evaluation of chest pain. He has had some chronic chest pain since his AAA repair years ago. However, over the last several weeks he's had worsening chest discomfort. This is somewhat different. It feels like a pressing sensation and it radiates to his back in the left scapular area. He has felt some discomfort in his neck. He describes pleuritic chest pain. His symptoms are definitely made worse by minimal activity (CCS class 3-4). Chest discomfort is improved by rest. He has chronic dyspnea (NYHA class IIb-3). He denies any changes. He has had associated nausea with chest discomfort but no diaphoresis. He denies syncope. He denies orthopnea, PND or edema.   Studies:  - LHC (10/2005):  Diagonals with 20%, mid CFX 20%, prox RCA 40%, mid RCA 60-70% (small), EF 60%. - Med Rx  - Nuclear (09/2011):  No ischemia, EF 62%, Normal.  - ABI (09/2011):  B/L 1.0 (normal)   Recent Labs: 05/20/2013: ALT 37  05/29/2013: Creatinine 1.16; Hemoglobin 12.1*; Potassium 4.5   Wt Readings from Last 3  Encounters:  01/28/14 315 lb 12.8 oz (143.246 kg)  05/20/13 320 lb 6.4 oz (145.332 kg)  11/07/11 311 lb 9.6 oz (141.341 kg)     Past Medical History  Diagnosis Date  . Morbid obesity   . Chronic obstructive pulmonary disease 2008    Moderate  . Community acquired pneumonia   . Hypertension   . Hyperlipidemia   . Degenerative joint disease   . Coronary artery disease   . Gastroesophageal reflux disease   . 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 Hypokalemia     diuretic induced, resolved  . Shortness of breath   . Gout   . Colon polyps   . H/O hiatal hernia   . Cancer     skin cancer  . Enlarged liver     fatty liver by '09 CT  . Sleep apnea     not currently using CPAP 05/20/13    Current Outpatient Prescriptions  Medication Sig Dispense Refill  . aspirin 81 MG tablet Take 81 mg by mouth daily.       . hydrochlorothiazide (HYDRODIURIL) 25 MG tablet Take 25 mg by mouth daily.      Marland Kitchen ibuprofen (ADVIL,MOTRIN) 200 MG tablet Take 200-400 mg by mouth every 6 (six) hours as needed for  pain.      . lisinopril (PRINIVIL,ZESTRIL) 5 MG tablet Take 5 mg by mouth daily.      . metoprolol succinate (TOPROL-XL) 25 MG 24 hr tablet Take 25 mg by mouth daily.      Marland Kitchen omeprazole (PRILOSEC) 20 MG capsule Take 20 mg by mouth daily.      . rosuvastatin (CRESTOR) 10 MG tablet Take 10 mg by mouth daily.       No current facility-administered medications for this visit.    Allergies:   Review of patient's allergies indicates no known allergies.   Social History:  The patient  reports that he has quit smoking. He has never used smokeless tobacco. He reports that he does not drink alcohol or use illicit drugs.   Family History:  The patient's family history includes Diabetes in his mother; Factor V Leiden deficiency in his sister; Heart disease in his father; Multiple  sclerosis in his sister; Osteoarthritis in his mother.   ROS:  Please see the history of present illness.   He had a cough several weeks ago. This has resolved. He denies fevers or chills.   All other systems reviewed and negative.   PHYSICAL EXAM: VS:  BP 130/70  Pulse 57  Ht 5' 9.5" (1.765 m)  Wt 315 lb 12.8 oz (143.246 kg)  BMI 45.98 kg/m2 Well nourished, well developed, in no acute distress HEENT: normal Neck:   I cannot assess JVD Cardiac:  Distant heart sounds; RRR; no obvious murmur Lungs:  clear to auscultation bilaterally, no wheezing, rhonchi or rales Abd: soft, nontender, no hepatomegaly Ext: trace-1+ bilateral LE edema Skin: warm and dry Neuro:  CNs 2-12 intact, no focal abnormalities noted  EKG:  Sinus bradycardia, HR 57, T wave inversions in 1, aVL, no change from prior tracing     ASSESSMENT AND PLAN:  1. Chest pain:  He has symptoms that are concerning for CCS class III-IV angina. However, he also has a pleuritic component. There is a family history of hypercoagulable state. I have reviewed his case today with Dr. Martinique. We have recommended admission to the hospital. We will plan on chest CT upon arrival to rule out pulmonary embolism. Hopefully, this will also visualize some of his aorta. If there is no obvious problem with his AAA repair, he will be started on IV heparin. Check serial cardiac markers. Start IV nitroglycerin. Continue beta blocker and aspirin. He will be given nitroglycerin in the office as well as 4 baby aspirin. He'll be transported via EMS.  We will plan on cardiac cath on Monday unless his status changes. 2. CAD (coronary artery disease):  Admit to [facility]. Continue aspirin, beta blocker. Start IV nitroglycerin. Start IV heparin depending upon results of this chest CT. Plan on cardiac catheterization Monday 5/18. 3. HTN (hypertension): Controlled. 4. Hyperlipidemia: Continue statin. 5. PAD (peripheral artery disease): hopefully, CT will  be able to visualize enough of his aorta to rule out any significant problems. 6. Disposition:  Admit to [facility] today.  Signed, Richardson Dopp, PA-C  01/28/2014 8:35 AM

## 2014-01-28 NOTE — H&P (Signed)
History and Physical  Date:  01/28/2014   ID:  Alfred Carney, DOB Feb 02, 1951, MRN [id number]  PCP:  Helane Rima, MD  Cardiologist:  Dr. Peter Martinique      History of Present Illness: Alfred Carney is a 63 y.o. male with a hx of CAD, s/p cardiac catheterization in 2007 showing a 60-70% mid right coronary stenosis. This was unchanged compared to a prior study in 2004. He has a history of peripheral vascular disease and is status post repair of a thoracoabdominal aneurysm with right femoral and left iliac bypass grafting and reimplantation of his renal arteries.  Other hx includes HTN, HL, COPD, obesity.  Lipitor has caused him to have palpitations in the past. He does have a history of obstructive sleep apnea but hasn't used his CPAP mask in the past.  Last seen by Dr. Peter Martinique in 10/2011.  Myoview at that time was normal.    He presents today for evaluation of chest pain. He has had some chronic chest pain since his AAA repair years ago. However, over the last several weeks he's had worsening chest discomfort. This is somewhat different. It feels like a pressing sensation and it radiates to his back in the left scapular area. He has felt some discomfort in his neck. He describes pleuritic chest pain. His symptoms are definitely made worse by minimal activity (CCS class 3-4). Chest discomfort is improved by rest. He has chronic dyspnea (NYHA class IIb-3). He denies any changes. He has had associated nausea with chest discomfort but no diaphoresis. He denies syncope. He denies orthopnea, PND or edema.   Studies:  - LHC (10/2005):  Diagonals with 20%, mid CFX 20%, prox RCA 40%, mid RCA 60-70% (small), EF 60%. - Med Rx  - Nuclear (09/2011):  No ischemia, EF 62%, Normal.  - ABI (09/2011):  B/L 1.0 (normal)   Recent Labs: 05/20/2013: ALT 37  05/29/2013: Creatinine 1.16; Hemoglobin 12.1*; Potassium 4.5   Wt Readings from Last 3 Encounters:  01/28/14 315 lb 12.8 oz (143.246 kg)  05/20/13 320 lb  6.4 oz (145.332 kg)  11/07/11 311 lb 9.6 oz (141.341 kg)     Past Medical History  Diagnosis Date  . Morbid obesity   . Chronic obstructive pulmonary disease 2008    Moderate  . Community acquired pneumonia   . Hypertension   . Hyperlipidemia   . Degenerative joint disease   . Coronary artery disease   . Gastroesophageal reflux disease   . 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 Hypokalemia     diuretic induced, resolved  . Shortness of breath   . Gout   . Colon polyps   . H/O hiatal hernia   . Cancer     skin cancer  . Enlarged liver     fatty liver by '09 CT  . Sleep apnea     not currently using CPAP 05/20/13    Current Outpatient Prescriptions  Medication Sig Dispense Refill  . aspirin 81 MG tablet Take 81 mg by mouth daily.       . hydrochlorothiazide (HYDRODIURIL) 25 MG tablet Take 25 mg by mouth daily.      Marland Kitchen ibuprofen (ADVIL,MOTRIN) 200 MG tablet Take 200-400 mg by mouth every 6 (six) hours as needed for pain.      Marland Kitchen lisinopril (PRINIVIL,ZESTRIL) 5 MG tablet Take  5 mg by mouth daily.      . metoprolol succinate (TOPROL-XL) 25 MG 24 hr tablet Take 25 mg by mouth daily.      Marland Kitchen omeprazole (PRILOSEC) 20 MG capsule Take 20 mg by mouth daily.      . rosuvastatin (CRESTOR) 10 MG tablet Take 10 mg by mouth daily.       No current facility-administered medications for this visit.    Allergies:   Review of patient's allergies indicates no known allergies.   Social History:  The patient  reports that he has quit smoking. He has never used smokeless tobacco. He reports that he does not drink alcohol or use illicit drugs.   Family History:  The patient's family history includes Diabetes in his mother; Factor V Leiden deficiency in his sister; Heart disease in his father; Multiple sclerosis in his sister; Osteoarthritis in his mother.   ROS:  Please  see the history of present illness.   He had a cough several weeks ago. This has resolved. He denies fevers or chills.   All other systems reviewed and negative.   PHYSICAL EXAM: VS:  BP 130/70  Pulse 57  Ht 5' 9.5" (1.765 m)  Wt 315 lb 12.8 oz (143.246 kg)  BMI 45.98 kg/m2 Well nourished, well developed, in no acute distress HEENT: normal Neck:   I cannot assess JVD Cardiac:  Distant heart sounds; RRR; no obvious murmur Lungs:  clear to auscultation bilaterally, no wheezing, rhonchi or rales Abd: soft, nontender, no hepatomegaly Ext: trace-1+ bilateral LE edema Skin: warm and dry Neuro:  CNs 2-12 intact, no focal abnormalities noted  EKG:  Sinus bradycardia, HR 57, T wave inversions in 1, aVL, no change from prior tracing     ASSESSMENT AND PLAN:  1. Chest pain:  He has symptoms that are concerning for CCS class III-IV angina. However, he also has a pleuritic component. There is a family history of hypercoagulable state. I have reviewed his case today with Dr. Martinique. We have recommended admission to the hospital. We will plan on chest CT upon arrival to rule out pulmonary embolism. Hopefully, this will also visualize some of his aorta. If there is no obvious problem with his AAA repair, he will be started on IV heparin. Check serial cardiac markers. Start IV nitroglycerin. Continue beta blocker and aspirin. He will be given nitroglycerin in the office as well as 4 baby aspirin. He'll be transported via EMS.  We will plan on cardiac cath on Monday unless his status changes. 2. CAD (coronary artery disease):  Admit to [facility]. Continue aspirin, beta blocker. Start IV nitroglycerin. Start IV heparin depending upon results of this chest CT. Plan on cardiac catheterization Monday 5/18. 3. HTN (hypertension): Controlled. 4. Hyperlipidemia: Continue statin. 5. PAD (peripheral artery disease): hopefully, CT will be able to visualize enough of his aorta to rule out any significant  problems. 6. Disposition:  Admit to [facility] today.  Signed, Richardson Dopp, PA-C  01/28/2014 8:35 AM

## 2014-01-28 NOTE — Progress Notes (Signed)
UR Completed Twanda Stakes Graves-Bigelow, RN,BSN [phone]  

## 2014-01-29 DIAGNOSIS — I714 Abdominal aortic aneurysm, without rupture, unspecified: Secondary | ICD-10-CM

## 2014-01-29 DIAGNOSIS — R079 Chest pain, unspecified: Secondary | ICD-10-CM

## 2014-01-29 DIAGNOSIS — I2510 Atherosclerotic heart disease of native coronary artery without angina pectoris: Secondary | ICD-10-CM

## 2014-01-29 LAB — CBC
HCT: 40.9 % (ref 39.0–52.0)
Hemoglobin: 13 g/dL (ref 13.0–17.0)
MCH: 26.5 pg (ref 26.0–34.0)
MCHC: 31.8 g/dL (ref 30.0–36.0)
MCV: 83.5 fL (ref 78.0–100.0)
Platelets: 198 10*3/uL (ref 150–400)
RBC: 4.9 MIL/uL (ref 4.22–5.81)
RDW: 16 % — ABNORMAL HIGH (ref 11.5–15.5)
WBC: 8.2 10*3/uL (ref 4.0–10.5)

## 2014-01-29 LAB — GLUCOSE, CAPILLARY
Glucose-Capillary: 117 mg/dL — ABNORMAL HIGH (ref 70–99)
Glucose-Capillary: 117 mg/dL — ABNORMAL HIGH (ref 70–99)
Glucose-Capillary: 98 mg/dL (ref 70–99)
Glucose-Capillary: 136 mg/dL — ABNORMAL HIGH (ref 70–99)

## 2014-01-29 LAB — LIPID PANEL
Cholesterol: 88 mg/dL (ref 0–200)
HDL: 30 mg/dL — ABNORMAL LOW (ref >39)
LDL Cholesterol: 32 mg/dL (ref 0–99)
Total CHOL/HDL Ratio: 2.9 RATIO
Triglycerides: 130 mg/dL (ref <150)
VLDL: 26 mg/dL (ref 0–40)

## 2014-01-29 LAB — BASIC METABOLIC PANEL
BUN: 17 mg/dL (ref 6–23)
CO2: 27 mEq/L (ref 19–32)
Calcium: 9.5 mg/dL (ref 8.4–10.5)
Chloride: 100 mEq/L (ref 96–112)
Creatinine, Ser: 1.25 mg/dL (ref 0.50–1.35)
GFR calc Af Amer: 69 mL/min — ABNORMAL LOW (ref >90)
GFR calc non Af Amer: 59 mL/min — ABNORMAL LOW (ref >90)
Glucose, Bld: 105 mg/dL — ABNORMAL HIGH (ref 70–99)
Potassium: 3.9 mEq/L (ref 3.7–5.3)
Sodium: 142 mEq/L (ref 137–147)

## 2014-01-29 LAB — HEPARIN LEVEL (UNFRACTIONATED)
Heparin Unfractionated: 0.43 IU/mL (ref 0.30–0.70)
Heparin Unfractionated: 0.38 IU/mL (ref 0.30–0.70)

## 2014-01-29 MED ORDER — ROSUVASTATIN CALCIUM 5 MG PO TABS
5.0000 mg | ORAL_TABLET | Freq: Every day | ORAL | Status: DC
  Administered 2014-01-29 – 2014-01-31 (×3): 5 mg via ORAL
  Filled 2014-01-29 (×5): qty 1

## 2014-01-29 NOTE — Progress Notes (Signed)
DAILY PROGRESS NOTE  Subjective:  No events overnight. He ruled-out for MI. Says his pain is definitely more pleuritic.   Objective:  Temp:  [98 F (36.7 C)-98.7 F (37.1 C)] 98.7 F (37.1 C) (05/16 0520) Pulse Rate:  [52-87] 60 (05/16 0928) Resp:  [18] 18 (05/16 0520) BP: (99-149)/(36-64) 139/59 mmHg (05/16 0928) SpO2:  [92 %-96 %] 92 % (05/16 0520) Weight:  [313 lb 1.6 oz (142.021 kg)] 313 lb 1.6 oz (142.021 kg) (05/16 0520) Weight change:   Intake/Output from previous day: 05/15 0701 - 05/16 0700 In: 240 [P.O.:240] Out: -   Intake/Output from this shift: Total I/O In: 120 [P.O.:120] Out: -   Medications: Current Facility-Administered Medications  Medication Dose Route Frequency Provider Last Rate Last Dose  . 0.9 %  sodium chloride infusion  250 mL Intravenous PRN Liliane Shi, PA-C      . acetaminophen (TYLENOL) tablet 650 mg  650 mg Oral Q6H PRN Brittainy Simmons, PA-C   650 mg at 01/28/14 2024  . aspirin EC tablet 81 mg  81 mg Oral Daily Liliane Shi, PA-C   81 mg at 01/29/14 0950  . heparin ADULT infusion 100 units/mL (25000 units/250 mL)  1,400 Units/hr Intravenous Continuous Peter M Martinique, MD 14 mL/hr at 01/28/14 1957 1,400 Units/hr at 01/28/14 1957  . hydrochlorothiazide (HYDRODIURIL) tablet 25 mg  25 mg Oral Daily Liliane Shi, PA-C   25 mg at 01/29/14 0950  . lisinopril (PRINIVIL,ZESTRIL) tablet 5 mg  5 mg Oral Daily Liliane Shi, PA-C   5 mg at 01/29/14 0950  . metoprolol succinate (TOPROL-XL) 24 hr tablet 25 mg  25 mg Oral Daily Liliane Shi, PA-C   25 mg at 01/29/14 0950  . nitroGLYCERIN (NITROSTAT) SL tablet 0.4 mg  0.4 mg Sublingual Q5 Min x 3 PRN Liliane Shi, PA-C      . nitroGLYCERIN 0.2 mg/mL in dextrose 5 % infusion  3-30 mcg/min Intravenous Titrated Liliane Shi, PA-C 1.5 mL/hr at 01/28/14 1905 5 mcg/min at 01/28/14 1905  . pantoprazole (PROTONIX) EC tablet 40 mg  40 mg Oral Daily Liliane Shi, PA-C   40 mg at 01/29/14 0950  .  rosuvastatin (CRESTOR) tablet 10 mg  10 mg Oral q1800 Peter M Martinique, MD   10 mg at 01/28/14 2024  . sodium chloride 0.9 % injection 3 mL  3 mL Intravenous Q12H Liliane Shi, PA-C   3 mL at 01/28/14 1015  . sodium chloride 0.9 % injection 3 mL  3 mL Intravenous PRN Liliane Shi, PA-C        Physical Exam: General appearance: alert and no distress Neck: no carotid bruit and thick neck Lungs: diminished breath sounds bilaterally Heart: regular rate and rhythm Abdomen: morbidly obese, soft, non-tender Extremities: edema 1+ bilateral LE pitting edema Pulses: 2+ and symmetric  Lab Results: Results for orders placed during the hospital encounter of 01/28/14 (from the past 48 hour(s))  PROTIME-INR     Status: None   Collection Time    01/28/14 10:30 AM      Result Value Ref Range   Prothrombin Time 13.7  11.6 - 15.2 seconds   INR 1.07  0.00 - 1.49  APTT     Status: None   Collection Time    01/28/14 10:30 AM      Result Value Ref Range   aPTT 35  24 - 37 seconds  CBC WITH DIFFERENTIAL  Status: Abnormal   Collection Time    01/28/14 10:30 AM      Result Value Ref Range   WBC 8.0  4.0 - 10.5 K/uL   RBC 5.56  4.22 - 5.81 MIL/uL   Hemoglobin 14.8  13.0 - 17.0 g/dL   HCT 46.7  39.0 - 52.0 %   MCV 84.0  78.0 - 100.0 fL   MCH 26.6  26.0 - 34.0 pg   MCHC 31.7  30.0 - 36.0 g/dL   RDW 16.0 (*) 11.5 - 15.5 %   Platelets 180  150 - 400 K/uL   Neutrophils Relative % 65  43 - 77 %   Neutro Abs 5.2  1.7 - 7.7 K/uL   Lymphocytes Relative 27  12 - 46 %   Lymphs Abs 2.1  0.7 - 4.0 K/uL   Monocytes Relative 7  3 - 12 %   Monocytes Absolute 0.6  0.1 - 1.0 K/uL   Eosinophils Relative 1  0 - 5 %   Eosinophils Absolute 0.1  0.0 - 0.7 K/uL   Basophils Relative 0  0 - 1 %   Basophils Absolute 0.0  0.0 - 0.1 K/uL  TSH     Status: None   Collection Time    01/28/14 10:30 AM      Result Value Ref Range   TSH 2.020  0.350 - 4.500 uIU/mL   Comment: Please note change in reference range.    COMPREHENSIVE METABOLIC PANEL     Status: Abnormal   Collection Time    01/28/14 10:30 AM      Result Value Ref Range   Sodium 144  137 - 147 mEq/L   Potassium 4.2  3.7 - 5.3 mEq/L   Chloride 103  96 - 112 mEq/L   CO2 24  19 - 32 mEq/L   Glucose, Bld 108 (*) 70 - 99 mg/dL   BUN 19  6 - 23 mg/dL   Creatinine, Ser 1.14  0.50 - 1.35 mg/dL   Calcium 9.7  8.4 - 10.5 mg/dL   Total Protein 7.4  6.0 - 8.3 g/dL   Albumin 3.9  3.5 - 5.2 g/dL   AST 21  0 - 37 U/L   ALT 21  0 - 53 U/L   Alkaline Phosphatase 72  39 - 117 U/L   Total Bilirubin 0.6  0.3 - 1.2 mg/dL   GFR calc non Af Amer 66 (*) >90 mL/min   GFR calc Af Amer 77 (*) >90 mL/min   Comment: (NOTE)     The eGFR has been calculated using the CKD EPI equation.     This calculation has not been validated in all clinical situations.     eGFR's persistently <90 mL/min signify possible Chronic Kidney     Disease.  GLUCOSE, CAPILLARY     Status: Abnormal   Collection Time    01/28/14 11:41 AM      Result Value Ref Range   Glucose-Capillary 100 (*) 70 - 99 mg/dL   Comment 1 Documented in Chart     Comment 2 Notify RN    TROPONIN I     Status: None   Collection Time    01/28/14 12:25 PM      Result Value Ref Range   Troponin I <0.30  <0.30 ng/mL   Comment:            Due to the release kinetics of cTnI,     a negative result within the  first hours     of the onset of symptoms does not rule out     myocardial infarction with certainty.     If myocardial infarction is still suspected,     repeat the test at appropriate intervals.  PRO B NATRIURETIC PEPTIDE     Status: None   Collection Time    01/28/14 12:25 PM      Result Value Ref Range   Pro B Natriuretic peptide (BNP) 59.1  0 - 125 pg/mL  TROPONIN I     Status: None   Collection Time    01/28/14  4:34 PM      Result Value Ref Range   Troponin I <0.30  <0.30 ng/mL   Comment:            Due to the release kinetics of cTnI,     a negative result within the first hours      of the onset of symptoms does not rule out     myocardial infarction with certainty.     If myocardial infarction is still suspected,     repeat the test at appropriate intervals.  GLUCOSE, CAPILLARY     Status: None   Collection Time    01/28/14  4:51 PM      Result Value Ref Range   Glucose-Capillary 98  70 - 99 mg/dL   Comment 1 Documented in Chart     Comment 2 Notify RN    GLUCOSE, CAPILLARY     Status: Abnormal   Collection Time    01/28/14  8:29 PM      Result Value Ref Range   Glucose-Capillary 109 (*) 70 - 99 mg/dL  TROPONIN I     Status: None   Collection Time    01/28/14 10:01 PM      Result Value Ref Range   Troponin I <0.30  <0.30 ng/mL   Comment:            Due to the release kinetics of cTnI,     a negative result within the first hours     of the onset of symptoms does not rule out     myocardial infarction with certainty.     If myocardial infarction is still suspected,     repeat the test at appropriate intervals.  LIPID PANEL     Status: Abnormal   Collection Time    01/29/14  2:15 AM      Result Value Ref Range   Cholesterol 88  0 - 200 mg/dL   Triglycerides 130  <150 mg/dL   HDL 30 (*) >39 mg/dL   Total CHOL/HDL Ratio 2.9     VLDL 26  0 - 40 mg/dL   LDL Cholesterol 32  0 - 99 mg/dL   Comment:            Total Cholesterol/HDL:CHD Risk     Coronary Heart Disease Risk Table                         Men   Women      1/2 Average Risk   3.4   3.3      Average Risk       5.0   4.4      2 X Average Risk   9.6   7.1      3 X Average Risk  23.4   11.0  Use the calculated Patient Ratio     above and the CHD Risk Table     to determine the patient's CHD Risk.                ATP III CLASSIFICATION (LDL):      <100     mg/dL   Optimal      100-129  mg/dL   Near or Above                        Optimal      130-159  mg/dL   Borderline      160-189  mg/dL   High      >190     mg/dL   Very High  BASIC METABOLIC PANEL     Status: Abnormal    Collection Time    01/29/14  2:15 AM      Result Value Ref Range   Sodium 142  137 - 147 mEq/L   Potassium 3.9  3.7 - 5.3 mEq/L   Chloride 100  96 - 112 mEq/L   CO2 27  19 - 32 mEq/L   Glucose, Bld 105 (*) 70 - 99 mg/dL   BUN 17  6 - 23 mg/dL   Creatinine, Ser 1.25  0.50 - 1.35 mg/dL   Calcium 9.5  8.4 - 10.5 mg/dL   GFR calc non Af Amer 59 (*) >90 mL/min   GFR calc Af Amer 69 (*) >90 mL/min   Comment: (NOTE)     The eGFR has been calculated using the CKD EPI equation.     This calculation has not been validated in all clinical situations.     eGFR's persistently <90 mL/min signify possible Chronic Kidney     Disease.  CBC     Status: Abnormal   Collection Time    01/29/14  2:15 AM      Result Value Ref Range   WBC 8.2  4.0 - 10.5 K/uL   RBC 4.90  4.22 - 5.81 MIL/uL   Hemoglobin 13.0  13.0 - 17.0 g/dL   HCT 40.9  39.0 - 52.0 %   MCV 83.5  78.0 - 100.0 fL   MCH 26.5  26.0 - 34.0 pg   MCHC 31.8  30.0 - 36.0 g/dL   RDW 16.0 (*) 11.5 - 15.5 %   Platelets 198  150 - 400 K/uL  HEPARIN LEVEL (UNFRACTIONATED)     Status: None   Collection Time    01/29/14  2:15 AM      Result Value Ref Range   Heparin Unfractionated 0.43  0.30 - 0.70 IU/mL   Comment:            IF HEPARIN RESULTS ARE BELOW     EXPECTED VALUES, AND PATIENT     DOSAGE HAS BEEN CONFIRMED,     SUGGEST FOLLOW UP TESTING     OF ANTITHROMBIN III LEVELS.  GLUCOSE, CAPILLARY     Status: Abnormal   Collection Time    01/29/14  7:33 AM      Result Value Ref Range   Glucose-Capillary 117 (*) 70 - 99 mg/dL    Imaging: Ct Angio Chest Pe W/cm &/or Wo Cm  01/28/2014   CLINICAL DATA:  Left-sided chest pain evaluate for pulmonary embolism and/or thoracic aortic dissection.  EXAM: CT ANGIOGRAPHY CHEST, ABDOMEN AND PELVIS  TECHNIQUE: Multidetector CT imaging through the chest, abdomen and pelvis was performed using the standard protocol  during bolus administration of intravenous contrast. Multiplanar reconstructed images and  MIPs were obtained and reviewed to evaluate the vascular anatomy.  CONTRAST:  163m OMNIPAQUE IOHEXOL 350 MG/ML SOLN  COMPARISON:  Chest CT - 04/02/2007; 09/21/2004  FINDINGS: Vascular Findings of the chest:  Scattered mixed calcified and noncalcified atherosclerotic plaque within pain tortuous but normal caliber thoracic aorta. Review of the noncontrast images are negative for definitive presence of an intramural hematoma. No definite thoracic aortic dissection or periaortic stranding.  Conventional configuration of the aortic arch. The branch vessels of the aortic arch widely patent throughout their imaged course.  Borderline cardiomegaly. Coronary artery calcifications. No pericardial effusion.  There is adequate opacification of the pulmonary arteries with the main pulmonary artery measuring 429 Hounsfield units. There are no discrete filling defects within the pulmonary arterial tree to the level of the bilateral subsegmental pulmonary arteries. Evaluation of distal subsegmental pulmonary arteries is degraded secondary to patient respiratory artifact. Normal caliber the main pulmonary artery.  -------------------------------------------------------------  Non-Vascular Findings of the chest:  Evaluation of the pulmonary parenchyma is degraded secondary to patient respiratory artifact. Severe centrilobular and paraseptal emphysematous change. Minimal dependent ground-glass atelectasis. No discrete focal airspace opacities. No pleural effusion or pneumothorax. No evidence of edema. The central pulmonary airways appear widely patent.  No mediastinal, hilar or axillary lymphadenopathy.  No acute or aggressive osseus abnormalities within the chest. 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  ---------------------------------------------------------------  Vascular Findings of the abdomen and pelvis:  Abdominal aorta: Post open surgical repair of abdominal aortic aneurysm. The left limb of  the bypass graft is anastomosed with the left common iliac artery wall in the right limb is anastomosed with the right common femoral artery. The proximal and distal ends of the bypass graft appear widely patent. No evidence of abdominal aortic dissection or periaortic stranding.  Celiac artery: There is a minimal amount of eccentric mixed calcified and noncalcified atherosclerotic plaque involving the origin of the celiac artery, not result in hemodynamically significant stenosis. Conventional branching pattern.  SMA: There is eccentric mixed calcified and noncalcified atherosclerotic plaque involving the origin of the SMA, not resulting in a hemodynamically significant stenosis.  Right Renal artery: Solitary, there is a minimal amount of eccentric calcified plaque involving the origin proximal aspect of the right renal artery, not resulting in hemodynamically significant stenosis.  Left Renal artery: Solitary; there is a minimal amount of eccentric calcified plaque involving the origin of the left renal artery, not result in hemodynamically significant stenosis.  IMA: Expected occluded at its origin with reconstitution via collateral supply from the marginal artery of [place].  Pelvic vasculature: There is a retrograde filling of the bilateral common and internal iliac arteries. As above, the aorto bypass graft is anastomosed on the left side with the left common iliac artery and on the right side with the common femoral artery.  Review of the MIP images confirms the above findings.   --------------------------------------------------------------------------------  Nonvascular Findings of the abdomen and pelvis:  Evaluation of the abdominal organs is limited to the arterial phase of enhancement. Normal hepatic contour. No discrete hyperenhancing hepatic lesions. Post cholecystectomy. No intra or extrahepatic biliary duct dilatation. No ascites.  There is symmetric enhancement of the bilateral kidneys. There is  geographic atrophy involving the superior poles of the bilateral kidneys as well as the inferior pole left kidney, likely the sequela of remote ischemic etiology. No definite renal stones as postcontrast examination. No discrete lesions. No urinary  obstruction or perinephric stranding.  Linear calcification within the medial limb of the left adrenal gland, nonspecific, possibly secondary to prior hemorrhage. No discrete adrenal nodule. Normal appearance of the pancreas and spleen.  Hiatal hernia. Scattered colonic diverticulosis without evidence of diverticulitis. Moderate colonic stool burden without evidence of obstruction. The distal end of the appendix appears somewhat tethered to the right bypass graft limb but is otherwise normal. No pneumoperitoneum, pneumatosis or portal venous gas.  No retroperitoneal, mesenteric, pelvic or inguinal lymphadenopathy.  Normal appearance of the pelvic organs. No free fluid in the pelvis.  No acute or aggressive osseous abnormalities within the abdomen and pelvis. Moderate to severe multilevel lumbar spine DDD, worse at L5-S1 with disc space height loss, endplate irregularity and posteriorly directed disc osteophyte complexes dislocation. Post left total hip replacement, incompletely evaluated.  There is a dystrophic calcification within the subcutaneous tissues of the left upper abdomen, likely an area peripherally calcified fat necrosis.  IMPRESSION: Chest Impression:  1. No acute cardiopulmonary disease on this motion degraded examination. Specifically, no evidence of pulmonary embolism, thoracic aortic aneurysm or dissection. 2. Advanced emphysematous change. 3. Coronary artery calcifications. Abdomen and pelvis Impression:  1. No acute findings within the abdomen or pelvis. 2. Post open abdominal aortic aneurysm repair without evidence of complication. 3. Geographic defects within the bilateral renal cortices, likely the sequela of remote ischemic etiology. No evidence of  urinary obstruction. 4. Hiatal hernia. Colonic diverticulosis without evidence of colonic diverticulitis.   Electronically Signed   By: Sandi Mariscal M.D.   On: 01/28/2014 15:10   Ct Angio Abd/pel W/ And/or W/o  01/28/2014   CLINICAL DATA:  Left-sided chest pain evaluate for pulmonary embolism and/or thoracic aortic dissection.  EXAM: CT ANGIOGRAPHY CHEST, ABDOMEN AND PELVIS  TECHNIQUE: Multidetector CT imaging through the chest, abdomen and pelvis was performed using the standard protocol during bolus administration of intravenous contrast. Multiplanar reconstructed images and MIPs were obtained and reviewed to evaluate the vascular anatomy.  CONTRAST:  138m OMNIPAQUE IOHEXOL 350 MG/ML SOLN  COMPARISON:  Chest CT - 04/02/2007; 09/21/2004  FINDINGS: Vascular Findings of the chest:  Scattered mixed calcified and noncalcified atherosclerotic plaque within pain tortuous but normal caliber thoracic aorta. Review of the noncontrast images are negative for definitive presence of an intramural hematoma. No definite thoracic aortic dissection or periaortic stranding.  Conventional configuration of the aortic arch. The branch vessels of the aortic arch widely patent throughout their imaged course.  Borderline cardiomegaly. Coronary artery calcifications. No pericardial effusion.  There is adequate opacification of the pulmonary arteries with the main pulmonary artery measuring 429 Hounsfield units. There are no discrete filling defects within the pulmonary arterial tree to the level of the bilateral subsegmental pulmonary arteries. Evaluation of distal subsegmental pulmonary arteries is degraded secondary to patient respiratory artifact. Normal caliber the main pulmonary artery.  -------------------------------------------------------------  Non-Vascular Findings of the chest:  Evaluation of the pulmonary parenchyma is degraded secondary to patient respiratory artifact. Severe centrilobular and paraseptal emphysematous  change. Minimal dependent ground-glass atelectasis. No discrete focal airspace opacities. No pleural effusion or pneumothorax. No evidence of edema. The central pulmonary airways appear widely patent.  No mediastinal, hilar or axillary lymphadenopathy.  No acute or aggressive osseus abnormalities within the chest. 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  ---------------------------------------------------------------  Vascular Findings of the abdomen and pelvis:  Abdominal aorta: Post open surgical repair of abdominal aortic aneurysm. The left limb of the bypass graft is anastomosed  with the left common iliac artery wall in the right limb is anastomosed with the right common femoral artery. The proximal and distal ends of the bypass graft appear widely patent. No evidence of abdominal aortic dissection or periaortic stranding.  Celiac artery: There is a minimal amount of eccentric mixed calcified and noncalcified atherosclerotic plaque involving the origin of the celiac artery, not result in hemodynamically significant stenosis. Conventional branching pattern.  SMA: There is eccentric mixed calcified and noncalcified atherosclerotic plaque involving the origin of the SMA, not resulting in a hemodynamically significant stenosis.  Right Renal artery: Solitary, there is a minimal amount of eccentric calcified plaque involving the origin proximal aspect of the right renal artery, not resulting in hemodynamically significant stenosis.  Left Renal artery: Solitary; there is a minimal amount of eccentric calcified plaque involving the origin of the left renal artery, not result in hemodynamically significant stenosis.  IMA: Expected occluded at its origin with reconstitution via collateral supply from the marginal artery of [place].  Pelvic vasculature: There is a retrograde filling of the bilateral common and internal iliac arteries. As above, the aorto bypass graft is anastomosed on the  left side with the left common iliac artery and on the right side with the common femoral artery.  Review of the MIP images confirms the above findings.   --------------------------------------------------------------------------------  Nonvascular Findings of the abdomen and pelvis:  Evaluation of the abdominal organs is limited to the arterial phase of enhancement. Normal hepatic contour. No discrete hyperenhancing hepatic lesions. Post cholecystectomy. No intra or extrahepatic biliary duct dilatation. No ascites.  There is symmetric enhancement of the bilateral kidneys. There is geographic atrophy involving the superior poles of the bilateral kidneys as well as the inferior pole left kidney, likely the sequela of remote ischemic etiology. No definite renal stones as postcontrast examination. No discrete lesions. No urinary obstruction or perinephric stranding.  Linear calcification within the medial limb of the left adrenal gland, nonspecific, possibly secondary to prior hemorrhage. No discrete adrenal nodule. Normal appearance of the pancreas and spleen.  Hiatal hernia. Scattered colonic diverticulosis without evidence of diverticulitis. Moderate colonic stool burden without evidence of obstruction. The distal end of the appendix appears somewhat tethered to the right bypass graft limb but is otherwise normal. No pneumoperitoneum, pneumatosis or portal venous gas.  No retroperitoneal, mesenteric, pelvic or inguinal lymphadenopathy.  Normal appearance of the pelvic organs. No free fluid in the pelvis.  No acute or aggressive osseous abnormalities within the abdomen and pelvis. Moderate to severe multilevel lumbar spine DDD, worse at L5-S1 with disc space height loss, endplate irregularity and posteriorly directed disc osteophyte complexes dislocation. Post left total hip replacement, incompletely evaluated.  There is a dystrophic calcification within the subcutaneous tissues of the left upper abdomen, likely an  area peripherally calcified fat necrosis.  IMPRESSION: Chest Impression:  1. No acute cardiopulmonary disease on this motion degraded examination. Specifically, no evidence of pulmonary embolism, thoracic aortic aneurysm or dissection. 2. Advanced emphysematous change. 3. Coronary artery calcifications. Abdomen and pelvis Impression:  1. No acute findings within the abdomen or pelvis. 2. Post open abdominal aortic aneurysm repair without evidence of complication. 3. Geographic defects within the bilateral renal cortices, likely the sequela of remote ischemic etiology. No evidence of urinary obstruction. 4. Hiatal hernia. Colonic diverticulosis without evidence of colonic diverticulitis.   Electronically Signed   By: Sandi Mariscal M.D.   On: 01/28/2014 15:10    Assessment:  Principal Problem:   Chest pain Active  Problems:   Morbid obesity   CAD (coronary artery disease)   PAD (peripheral artery disease)   Hyperlipidemia   HTN (hypertension)   Plan:  1. CT scan was unremarkable and did not demonstrate PE or problems with the aortic stent-graft. Ruled-out for MI overnight. Plan for [facility] on Monday. Continue heparin and nitro gtts.  Cholesterol is very low - would decrease crestor dose to 5 mg daily.  Time Spent Directly with Patient:  15 minutes  Length of Stay:  LOS: 1 day   Pixie Casino, MD, [facility] Attending Cardiologist [place] 01/29/2014, 11:51 AM

## 2014-01-29 NOTE — Progress Notes (Signed)
ANTICOAGULATION CONSULT NOTE  Pharmacy Consult for Heparin Indication: chest pain/ACS  No Known Allergies  Patient Measurements: Height: 5' 9.69" (177 cm) Weight: 315 lb 11.2 oz (143.2 kg) IBW/kg (Calculated) : 72.28  1.25x ibw = 90 kg Heparin Dosing Weight: 105 kg Vital Signs: Temp: 98 F (36.7 C) (05/15 2031) Temp src: Oral (05/15 2031) BP: 118/48 mmHg (05/15 2031) Pulse Rate: 63 (05/15 1904)  Labs:  Recent Labs  01/28/14 1030 01/28/14 1225 01/28/14 1634 01/28/14 2201 01/29/14 0215  HGB 14.8  --   --   --  13.0  HCT 46.7  --   --   --  40.9  PLT 180  --   --   --  198  APTT 35  --   --   --   --   LABPROT 13.7  --   --   --   --   INR 1.07  --   --   --   --   HEPARINUNFRC  --   --   --   --  0.43  CREATININE 1.14  --   --   --   --   TROPONINI  --  <0.30 <0.30 <0.30  --     Estimated Creatinine Clearance: 93.2 ml/min (by C-G formula based on Cr of 1.14).   Assessment: 63 y.o. male with chest pain for heparin   Goal of Therapy:  Heparin level 0.3-0.7 units/ml Monitor platelets by anticoagulation protocol: Yes   Plan:  Continue Heparin at current rate  Phillis Knack, PharmD, BCPS

## 2014-01-29 NOTE — Progress Notes (Signed)
Utilization review complete 

## 2014-01-29 NOTE — Progress Notes (Signed)
ANTICOAGULATION CONSULT NOTE   Pharmacy Consult for Heparin Indication: chest pain/ACS  No Known Allergies  Patient Measurements: Height: 5' 9.69" (177 cm) Weight: 313 lb 1.6 oz (142.021 kg) IBW/kg (Calculated) : 72.28  Heparin Dosing Weight: 105 kg  Vital Signs: Temp: 97.5 F (36.4 C) (05/16 1445) Temp src: Oral (05/16 1445) BP: 123/50 mmHg (05/16 1445) Pulse Rate: 57 (05/16 1445)  Labs:  Recent Labs  01/28/14 1030 01/28/14 1225 01/28/14 1634 01/28/14 2201 01/29/14 0215 01/29/14 1550  HGB 14.8  --   --   --  13.0  --   HCT 46.7  --   --   --  40.9  --   PLT 180  --   --   --  198  --   APTT 35  --   --   --   --   --   LABPROT 13.7  --   --   --   --   --   INR 1.07  --   --   --   --   --   HEPARINUNFRC  --   --   --   --  0.43 0.38  CREATININE 1.14  --   --   --  1.25  --   TROPONINI  --  <0.30 <0.30 <0.30  --   --     Estimated Creatinine Clearance: 84.6 ml/min (by C-G formula based on Cr of 1.25).  Assessment: 9 yom continues on IV heparin for chest pain. Heparin level remains at goal. No problems or bleeding noted. Planning LHC on Monday.  Goal of Therapy:  Heparin level 0.3-0.7 units/ml Monitor platelets by anticoagulation protocol: Yes   Plan:  1. Continue heparin gtt at current rate of 1400 units/hr 2. F/u AM heparin level and CBC  Salome Arnt, PharmD, BCPS Pager # [phone] 01/29/2014 4:39 PM

## 2014-01-30 DIAGNOSIS — I10 Essential (primary) hypertension: Secondary | ICD-10-CM

## 2014-01-30 DIAGNOSIS — I739 Peripheral vascular disease, unspecified: Secondary | ICD-10-CM

## 2014-01-30 LAB — CBC
HCT: 41.6 % (ref 39.0–52.0)
Hemoglobin: 13.4 g/dL (ref 13.0–17.0)
MCH: 26.7 pg (ref 26.0–34.0)
MCHC: 32.2 g/dL (ref 30.0–36.0)
MCV: 82.9 fL (ref 78.0–100.0)
Platelets: 220 10*3/uL (ref 150–400)
RBC: 5.02 MIL/uL (ref 4.22–5.81)
RDW: 16.3 % — ABNORMAL HIGH (ref 11.5–15.5)
WBC: 6.8 10*3/uL (ref 4.0–10.5)

## 2014-01-30 LAB — GLUCOSE, CAPILLARY
Glucose-Capillary: 130 mg/dL — ABNORMAL HIGH (ref 70–99)
Glucose-Capillary: 103 mg/dL — ABNORMAL HIGH (ref 70–99)
Glucose-Capillary: 97 mg/dL (ref 70–99)
Glucose-Capillary: 89 mg/dL (ref 70–99)

## 2014-01-30 LAB — HEPARIN LEVEL (UNFRACTIONATED): Heparin Unfractionated: 0.35 IU/mL (ref 0.30–0.70)

## 2014-01-30 MED ORDER — SODIUM CHLORIDE 0.9 % IV SOLN: INTRAVENOUS | Status: DC

## 2014-01-30 NOTE — Progress Notes (Signed)
  Progress Note   Subjective:  Still having some pleuritic type chest pain.  It is much improved.  Also notes increased pain with activity.  But, overall improved since prior to admission.    Objective:  Filed Vitals:   01/29/14 0928 01/29/14 1445 01/29/14 2100 01/30/14 0500  BP: 139/59 123/50 137/63 144/74  Pulse: 60 57 65 64  Temp:  97.5 F (36.4 C) 98.2 F (36.8 C) 97.3 F (36.3 C)  TempSrc:  Oral Oral   Resp:  17 16 18   Height:      Weight:    307 lb 9.6 oz (139.526 kg)  SpO2:  96% 94% 91%    Intake/Output from previous day:  Intake/Output Summary (Last 24 hours) at 01/30/14 0909 Last data filed at 01/30/14 0827  Gross per 24 hour  Intake 687.81 ml  Output      0 ml  Net 687.81 ml    PHYSICAL EXAM: No acute distress Neck: no JVD at 90 degrees Cardiac:  normal S1, S2; RRR; no murmurno rub Lungs:  clear to auscultation bilaterally, no wheezing, rhonchi or rales Abd: soft, nontender, no hepatomegaly Ext: trace-1+ bilateral LE edema Skin: warm and dry Neuro:  CNs 2-12 intact, no focal abnormalities noted   Lab Results:  Basic Metabolic Panel:  Recent Labs  01/28/14 1030 01/29/14 0215  NA 144 142  K 4.2 3.9  CL 103 100  CO2 24 27  GLUCOSE 108* 105*  BUN 19 17  CREATININE 1.14 1.25  CALCIUM 9.7 9.5    CBC:  Recent Labs  01/28/14 1030 01/29/14 0215 01/30/14 0537  WBC 8.0 8.2 6.8  NEUTROABS 5.2  --   --   HGB 14.8 13.0 13.4  HCT 46.7 40.9 41.6  MCV 84.0 83.5 82.9  PLT 180 198 220    Cardiac Enzymes:  Recent Labs  01/28/14 1225 01/28/14 1634 01/28/14 2201  TROPONINI <0.30 <0.30 <0.30     Assessment/Plan:   1. Chest Pain:  MI ruled out.  CT neg for pulmonary embolism or aortic dissection.  Continue Heparin, IV NTG.  Plan for [facility] tomorrow.  If anatomy stable, consider trial of NSAIDs. 2. CAD:  Continue ASA, Heparin, beta blocker, statin.  Plan LHC tomorrow. 3. HTN:  Controlled.  4. Hyperlipidemia:  Continue statin. 5. PAD:  Stable  aneurysm repair on chest/abdominal CT. 6. Disposition:  Proceed with cath tomorrow.  Risks and benefits of cardiac catheterization have been discussed with the patient.  These include bleeding, infection, kidney damage, stroke, heart attack, death.  The patient understands these risks and is willing to proceed.   Richardson Dopp, PA-C   01/30/2014 9:02 AM  Pager # [phone]

## 2014-01-30 NOTE — Progress Notes (Signed)
Pt. Seen and examined. Agree with the NP/PA-C note as written. Pain is improved - probably chest wall pain, however, given coronary history and risk factors, plan for [facility] tomorrow.  Pixie Casino, MD, [facility] Attending Cardiologist [place]

## 2014-01-30 NOTE — Progress Notes (Signed)
ANTICOAGULATION CONSULT NOTE - Follow Up Consult  Pharmacy Consult  :  Heparin Indication  :  Chest pain  Heparin Dosing Weight: 105 kg   Recent Labs  01/28/14 1030 01/29/14 0215 01/29/14 1550 01/30/14 0537  HGB 14.8 13.0  --  13.4  HCT 46.7 40.9  --  41.6  PLT 180 198  --  220  APTT 35  --   --   --   LABPROT 13.7  --   --   --   INR 1.07  --   --   --   HEPARINUNFRC  --  0.43 0.38 0.35  CREATININE 1.14 1.25  --   --      Medications: Scheduled:  . aspirin EC  81 mg Oral Daily  . hydrochlorothiazide  25 mg Oral Daily  . lisinopril  5 mg Oral Daily  . metoprolol succinate  25 mg Oral Daily  . pantoprazole  40 mg Oral Daily  . rosuvastatin  5 mg Oral q1800  . sodium chloride  3 mL Intravenous Q12H    Infusions:  . [START ON 01/31/2014] sodium chloride    . heparin 1,400 Units/hr (01/30/14 0714)  . nitroGLYCERIN 5 mcg/min (01/28/14 1905)    Assessment:  63 y/o male with continued chest pain, somewhat pleuritic in nature, who is continuing on Heparin infusion.  MI, PE, and Aortic Dissection have been ruled out.  Cardiac Cath scheduled for 01/31/14.  Heparin rate 1400 units/hr.  Heparin level 0.35.  No evidence of bleeding complications noted   Goal:  Heparin level 0.3-0.7 units/ml   Plan: 1. Continue Heparin at 1400 units/hr. 2. Daily Heparin Levels, Platelet counts, CBC.  Monitor for bleeding complications  3. Follow up post-Cath Monday.   Johnryan Sao, Craig Guess,  Pharm.D  01/30/2014, 10:58 AM

## 2014-01-31 ENCOUNTER — Encounter (HOSPITAL_COMMUNITY)
Admission: AD | Disposition: A | Discharge status: Home / Self Care | Payer: Self-pay | Source: Ambulatory Visit | Attending: Cardiology

## 2014-01-31 DIAGNOSIS — E785 Hyperlipidemia, unspecified: Secondary | ICD-10-CM

## 2014-01-31 DIAGNOSIS — I2510 Atherosclerotic heart disease of native coronary artery without angina pectoris: Secondary | ICD-10-CM

## 2014-01-31 HISTORY — PX: LEFT HEART CATHETERIZATION WITH CORONARY ANGIOGRAM: SHX5451

## 2014-01-31 LAB — CBC
HCT: 42.2 % (ref 39.0–52.0)
Hemoglobin: 13.6 g/dL (ref 13.0–17.0)
MCH: 26.7 pg (ref 26.0–34.0)
MCHC: 32.2 g/dL (ref 30.0–36.0)
MCV: 82.9 fL (ref 78.0–100.0)
Platelets: 222 10*3/uL (ref 150–400)
RBC: 5.09 MIL/uL (ref 4.22–5.81)
RDW: 16.1 % — ABNORMAL HIGH (ref 11.5–15.5)
WBC: 8.5 10*3/uL (ref 4.0–10.5)

## 2014-01-31 LAB — GLUCOSE, CAPILLARY
Glucose-Capillary: 123 mg/dL — ABNORMAL HIGH (ref 70–99)
Glucose-Capillary: 110 mg/dL — ABNORMAL HIGH (ref 70–99)
Glucose-Capillary: 104 mg/dL — ABNORMAL HIGH (ref 70–99)

## 2014-01-31 LAB — HEPARIN LEVEL (UNFRACTIONATED): Heparin Unfractionated: 0.36 IU/mL (ref 0.30–0.70)

## 2014-01-31 SURGERY — LEFT HEART CATHETERIZATION WITH CORONARY ANGIOGRAM
Anesthesia: LOCAL

## 2014-01-31 MED ORDER — SODIUM CHLORIDE 0.9 % IV SOLN
INTRAVENOUS | Status: AC
  Administered 2014-01-31: 16:00:00 via INTRAVENOUS

## 2014-01-31 MED ORDER — NITROGLYCERIN 0.2 MG/ML ON CALL CATH LAB
INTRAVENOUS | Status: AC
  Filled 2014-01-31: qty 1

## 2014-01-31 MED ORDER — SODIUM CHLORIDE 0.9 % IJ SOLN
3.0000 mL | Freq: Two times a day (BID) | INTRAMUSCULAR | Status: DC
  Administered 2014-01-31: 3 mL via INTRAVENOUS

## 2014-01-31 MED ORDER — SODIUM CHLORIDE 0.9 % IV SOLN: 250.0000 mL | INTRAVENOUS | Status: DC | PRN

## 2014-01-31 MED ORDER — MIDAZOLAM HCL 2 MG/2ML IJ SOLN
INTRAMUSCULAR | Status: AC
  Filled 2014-01-31: qty 2

## 2014-01-31 MED ORDER — NITROGLYCERIN 0.4 MG SL SUBL: 0.4000 mg | SUBLINGUAL_TABLET | SUBLINGUAL | Status: DC | PRN

## 2014-01-31 MED ORDER — ROSUVASTATIN CALCIUM 5 MG PO TABS: 5.0000 mg | ORAL_TABLET | Freq: Every day | ORAL | Status: DC

## 2014-01-31 MED ORDER — SODIUM CHLORIDE 0.9 % IJ SOLN: 3.0000 mL | INTRAMUSCULAR | Status: DC | PRN

## 2014-01-31 MED ORDER — LIDOCAINE HCL (PF) 1 % IJ SOLN
INTRAMUSCULAR | Status: AC
  Filled 2014-01-31: qty 30

## 2014-01-31 MED ORDER — HEPARIN SODIUM (PORCINE) 1000 UNIT/ML IJ SOLN
INTRAMUSCULAR | Status: AC
  Filled 2014-01-31: qty 1

## 2014-01-31 MED ORDER — FENTANYL CITRATE 0.05 MG/ML IJ SOLN
INTRAMUSCULAR | Status: AC
  Filled 2014-01-31: qty 2

## 2014-01-31 MED ORDER — HYDROCODONE-ACETAMINOPHEN 5-325 MG PO TABS
1.0000 | ORAL_TABLET | ORAL | Status: DC | PRN
  Administered 2014-01-31: 1 via ORAL
  Filled 2014-01-31: qty 1

## 2014-01-31 MED ORDER — VERAPAMIL HCL 2.5 MG/ML IV SOLN
INTRAVENOUS | Status: AC
  Filled 2014-01-31: qty 2

## 2014-01-31 MED ORDER — HEPARIN (PORCINE) IN NACL 2-0.9 UNIT/ML-% IJ SOLN
INTRAMUSCULAR | Status: AC
  Filled 2014-01-31: qty 1000

## 2014-01-31 NOTE — Discharge Instructions (Addendum)
Heart Healthy diet  Call [facility] [phone] if any bleeding, swelling or drainage at cath site.  May shower, no tub baths for 48 hours for groin sticks.   Take 1 NTG, under your tongue, while sitting.  If no relief of pain may repeat NTG, one tab every 5 minutes up to 3 tablets total over 15 minutes.  If no relief CALL 911.  If you have dizziness/lightheadness  while taking NTG, stop taking and call 911.         Our office will call with follow up appt date and time.       Radial Site Care Refer to this sheet in the next few weeks. These instructions provide you with information on caring for yourself after your procedure. Your caregiver may also give you more specific instructions. Your treatment has been planned according to current medical practices, but problems sometimes occur. Call your caregiver if you have any problems or questions after your procedure. HOME CARE INSTRUCTIONS  You may shower the day after the procedure.Remove the bandage (dressing) and gently wash the site with plain soap and water.Gently pat the site dry.  Do not apply powder or lotion to the site.  Do not submerge the affected site in water for 3 to 5 days.  Inspect the site at least twice daily.  Do not flex or bend the affected arm for 24 hours.  No lifting over 5 pounds (2.3 kg) for 5 days after your procedure.  Do not drive home if you are discharged the same day of the procedure. Have someone else drive you.  You may drive 24 hours after the procedure unless otherwise instructed by your caregiver.  Do not operate machinery or power tools for 24 hours.  A responsible adult should be with you for the first 24 hours after you arrive home. What to expect:  Any bruising will usually fade within 1 to 2 weeks.  Blood that collects in the tissue (hematoma) may be painful to the touch. It should usually decrease in size and tenderness within 1 to 2 weeks. SEEK IMMEDIATE  MEDICAL CARE IF:  You have unusual pain at the radial site.  You have redness, warmth, swelling, or pain at the radial site.  You have drainage (other than a small amount of blood on the dressing).  You have chills.  You have a fever or persistent symptoms for more than 72 hours.  You have a fever and your symptoms suddenly get worse.  Your arm becomes pale, cool, tingly, or numb.  You have heavy bleeding from the site. Hold pressure on the site. Document Released: 10/05/2010 Document Revised: 11/25/2011 Document Reviewed: 10/05/2010 [facility] Patient Information 2014 [place], [place].  Cardiac Diet This diet can help prevent heart disease and stroke. Many factors influence your heart health, including eating and exercise habits. Coronary risk rises a lot with abnormal blood fat (lipid) levels. Cardiac meal planning includes limiting unhealthy fats, increasing healthy fats, and making other small dietary changes. General guidelines are as follows:  Adjust calorie intake to reach and maintain desirable body weight.  Limit total fat intake to less than 30% of total calories. Saturated fat should be less than 7% of calories.  Saturated fats are found in animal products and in some vegetable products. Saturated vegetable fats are found in coconut oil, cocoa butter, palm oil, and palm kernel oil. Read labels carefully to avoid these products as much as possible. Use butter in moderation. Choose tub  margarines and oils that have 2 grams of fat or less. Good cooking oils are canola and olive oils.  Practice low-fat cooking techniques. Do not fry food. Instead, broil, bake, boil, steam, grill, roast on a rack, stir-fry, or microwave it. Other fat reducing suggestions include:  Remove the skin from poultry.  Remove all visible fat from meats.  Skim the fat off stews, soups, and gravies before serving them.  Steam vegetables in water or broth instead of sauting them in fat.  Avoid foods  with trans fat (or hydrogenated oils), such as commercially fried foods and commercially baked goods. Commercial shortening and deep-frying fats will contain trans fat.  Increase intake of fruits, vegetables, whole grains, and legumes to replace foods high in fat.  Increase consumption of nuts, legumes, and seeds to at least 4 servings weekly. One serving of a legume equals  cup, and 1 serving of nuts or seeds equals  cup.  Choose whole grains more often. Have 3 servings per day (a serving is 1 ounce [oz]).  Eat 4 to 5 servings of vegetables per day. A serving of vegetables is 1 cup of raw leafy vegetables;  cup of raw or cooked cut-up vegetables;  cup of vegetable juice.  Eat 4 to 5 servings of fruit per day. A serving of fruit is 1 medium whole fruit;  cup of dried fruit;  cup of fresh, frozen, or canned fruit;  cup of 100% fruit juice.  Increase your intake of dietary fiber to 20 to 30 grams per day. Insoluble fiber may help lower your risk of heart disease and may help curb your appetite.  Soluble fiber binds cholesterol to be removed from the blood. Foods high in soluble fiber are dried beans, citrus fruits, oats, apples, bananas, broccoli, Brussels sprouts, and eggplant.  Try to include foods fortified with plant sterols or stanols, such as yogurt, breads, juices, or margarines. Choose several fortified foods to achieve a daily intake of 2 to 3 grams of plant sterols or stanols.  Foods with omega-3 fats can help reduce your risk of heart disease. Aim to have a 3.5 oz portion of fatty fish twice per week, such as salmon, mackerel, albacore tuna, sardines, lake trout, or herring. If you wish to take a fish oil supplement, choose one that contains 1 gram of both DHA and EPA.  Limit processed meats to 2 servings (3 oz portion) weekly.  Limit the sodium in your diet to 1500 milligrams (mg) per day. If you have high blood pressure, talk to a registered dietitian about a DASH (Dietary  Approaches to Stop Hypertension) eating plan.  Limit sweets and beverages with added sugar, such as soda, to no more than 5 servings per week. One serving is:   1 tablespoon sugar.  1 tablespoon jelly or jam.   cup sorbet.  1 cup lemonade.   cup regular soda. CHOOSING FOODS Starches  Allowed: Breads: All kinds (wheat, rye, raisin, white, oatmeal, [place], [place], and English muffin bread). Low-fat rolls: English muffins, frankfurter and hamburger buns, bagels, pita bread, tortillas (not fried). Pancakes, waffles, biscuits, and muffins made with recommended oil.  Avoid: Products made with saturated or trans fats, oils, or whole milk products. Butter rolls, cheese breads, croissants. Commercial doughnuts, muffins, sweet rolls, biscuits, waffles, pancakes, store-bought mixes. Crackers  Allowed: Low-fat crackers and snacks: Animal, graham, rye, saltine (with recommended oil, no lard), oyster, and matzo crackers. Bread sticks, melba toast, rusks, flatbread, pretzels, and light popcorn.  Avoid: High-fat crackers:  cheese crackers, butter crackers, and those made with coconut, palm oil, or trans fat (hydrogenated oils). Buttered popcorn. Cereals  Allowed: Hot or cold whole-grain cereals.  Avoid: Cereals containing coconut, hydrogenated vegetable fat, or animal fat. Potatoes / Pasta / Rice  Allowed: All kinds of potatoes, rice, and pasta (such as macaroni, spaghetti, and noodles).  Avoid: Pasta or rice prepared with cream sauce or high-fat cheese. Chow mein noodles, [place] fries. Vegetables  Allowed: All vegetables and vegetable juices.  Avoid: Fried vegetables. Vegetables in cream, butter, or high-fat cheese sauces. Limit coconut. Fruit in cream or custard. Protein  Allowed: Limit your intake of meat, seafood, and poultry to no more than 6 oz (cooked weight) per day. All lean, well-trimmed beef, veal, pork, and lamb. All chicken and [place] without skin. All fish and shellfish.  Wild game: wild duck, rabbit, pheasant, and venison. Egg whites or low-cholesterol egg substitutes may be used as desired. Meatless dishes: recipes with dried beans, peas, lentils, and tofu (soybean curd). Seeds and nuts: all seeds and most nuts.  Avoid: Prime grade and other heavily marbled and fatty meats, such as short ribs, spare ribs, rib eye roast or steak, frankfurters, sausage, bacon, and high-fat luncheon meats, mutton. Caviar. Commercially fried fish. Domestic duck, goose, venison sausage. Organ meats: liver, gizzard, heart, chitterlings, brains, kidney, sweetbreads. Dairy  Allowed: Low-fat cheeses: nonfat or low-fat cottage cheese (1% or 2% fat), cheeses made with part skim milk, such as mozzarella, farmers, string, or ricotta. (Cheeses should be labeled no more than 2 to 6 grams fat per oz.). Skim (or 1%) milk: liquid, powdered, or evaporated. Buttermilk made with low-fat milk. Drinks made with skim or low-fat milk or cocoa. Chocolate milk or cocoa made with skim or low-fat (1%) milk. Nonfat or low-fat yogurt.  Avoid: Whole milk cheeses, including colby, cheddar, [place], Monterey Jack, [place], [place], [place], American, Swiss, and blue. Creamed cottage cheese, cream cheese. Whole milk and whole milk products, including buttermilk or yogurt made from whole milk, drinks made from whole milk. Condensed milk, evaporated whole milk, and 2% milk. Soups and Combination Foods  Allowed: Low-fat low-sodium soups: broth, dehydrated soups, homemade broth, soups with the fat removed, homemade cream soups made with skim or low-fat milk. Low-fat spaghetti, lasagna, chili, and Spanish rice if low-fat ingredients and low-fat cooking techniques are used.  Avoid: Cream soups made with whole milk, cream, or high-fat cheese. All other soups. Desserts and Sweets  Allowed: Sherbet, fruit ices, gelatins, meringues, and angel food cake. Homemade desserts with recommended fats, oils, and milk products. Jam,  jelly, honey, marmalade, sugars, and syrups. Pure sugar candy, such as gum drops, hard candy, jelly beans, marshmallows, mints, and small amounts of dark chocolate.  Avoid: Commercially prepared cakes, pies, cookies, frosting, pudding, or mixes for these products. Desserts containing whole milk products, chocolate, coconut, lard, palm oil, or palm kernel oil. Ice cream or ice cream drinks. Candy that contains chocolate, coconut, butter, hydrogenated fat, or unknown ingredients. Buttered syrups. Fats and Oils  Allowed: Vegetable oils: safflower, sunflower, corn, soybean, cottonseed, sesame, canola, olive, or peanut. Non-hydrogenated margarines. Salad dressing or mayonnaise: homemade or commercial, made with a recommended oil. Low or nonfat salad dressing or mayonnaise.  Limit added fats and oils to 6 to 8 tsp per day (includes fats used in cooking, baking, salads, and spreads on bread). Remember to count the "hidden fats" in foods.  Avoid: Solid fats and shortenings: butter, lard, salt pork, bacon drippings. Gravy containing meat fat, shortening, or  suet. Cocoa butter, coconut. Coconut oil, palm oil, palm kernel oil, or hydrogenated oils: these ingredients are often used in bakery products, nondairy creamers, whipped toppings, candy, and commercially fried foods. Read labels carefully. Salad dressings made of unknown oils, sour cream, or cheese, such as blue cheese and Ro[place]ef[place]. Cream, all kinds: half-and-half, light, heavy, or whipping. Sour cream or cream cheese (even if "light" or low-fat). Nondairy cream substitutes: coffee creamers and sour cream substitutes made with palm, palm kernel, hydrogenated oils, or coconut oil. Beverages  Allowed: Coffee (regular or decaffeinated), tea. Diet carbonated beverages, mineral water. Alcohol: Check with your caregiver. Moderation is recommended.  Avoid: Whole milk, regular sodas, and juice drinks with added sugar. Condiments  Allowed: All seasonings and  condiments. Cocoa powder. "Cream" sauces made with recommended ingredients.  Avoid: Carob powder made with hydrogenated fats. SAMPLE MENU Breakfast   cup orange juice   cup oatmeal  1 slice toast  1 tsp margarine  1 cup skim milk Lunch  [place] sandwich with 2 oz [place], 2 slices bread  Lettuce and tomato slices  Fresh fruit  Carrot sticks  Coffee or tea Snack  Fresh fruit or low-fat crackers Dinner  3 oz lean ground beef  1 baked potato  1 tsp margarine   cup asparagus  Lettuce salad  1 tbs non-creamy dressing   cup peach slices  1 cup skim milk Document Released: 06/11/2008 Document Revised: 03/03/2012 Document Reviewed: 11/26/2011 [employer] Patient Information 2014 [place], [place].

## 2014-01-31 NOTE — H&P (View-Only) (Signed)
Patient Name: Alfred Carney Date of Encounter: 01/31/2014  Principal Problem:   Chest pain Active Problems:   Morbid obesity   CAD (coronary artery disease)   PAD (peripheral artery disease)   Hyperlipidemia   HTN (hypertension)    Patient Profile: 63 y.o. male with a hx of 60-70% mid right coronary stenosis. 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 Other hx includes HTN, HL, COPD, obesity and OSA not on CPAP. Admitted 05/15 w/ chest pain. Cath planned this pm.  SUBJECTIVE: Having sharp chest pain. Lower edge of left ribs, through to his back. Worse with deep inspiration. Back slightly tender with ?possible spasm noted.  OBJECTIVE Filed Vitals:   01/30/14 1129 01/30/14 1442 01/30/14 2100 01/31/14 0500  BP: 156/63 143/47 144/100 111/51  Pulse: 63 54 57 61  Temp:  97.7 F (36.5 C) 98.1 F (36.7 C) 97.8 F (36.6 C)  TempSrc:  Oral    Resp:  17 20 20   Height:      Weight:    307 lb 9.6 oz (139.526 kg)  SpO2:  93% 95% 92%    Intake/Output Summary (Last 24 hours) at 01/31/14 0842 Last data filed at 01/30/14 1848  Gross per 24 hour  Intake    426 ml  Output      0 ml  Net    426 ml   Filed Weights   01/29/14 0520 01/30/14 0500 01/31/14 0500  Weight: 313 lb 1.6 oz (142.021 kg) 307 lb 9.6 oz (139.526 kg) 307 lb 9.6 oz (139.526 kg)    PHYSICAL EXAM General: Well developed, well nourished, male in no acute distress. Head: Normocephalic, atraumatic.  Neck: Supple without bruits, JVD not elevated. Lungs:  Resp regular and unlabored, CTA. Heart: RRR, S1, S2, no S3, S4, or murmur; no rub. Abdomen: Soft, non-tender, non-distended, BS + x 4.  Extremities: No clubbing, cyanosis, no edema.  Neuro: Alert and oriented X 3. Moves all extremities spontaneously. Psych: Normal affect.  LABS: CBC: Recent Labs  01/28/14 1030  01/30/14 0537 01/31/14 0516  WBC 8.0  < > 6.8 8.5    NEUTROABS 5.2  --   --   --   HGB 14.8  < > 13.4 13.6  HCT 46.7  < > 41.6 42.2  MCV 84.0  < > 82.9 82.9  PLT 180  < > 220 222  < > = values in this interval not displayed. INR: Recent Labs  01/28/14 1030  INR 7.62   Basic Metabolic Panel: Recent Labs  01/28/14 1030 01/29/14 0215  NA 144 142  K 4.2 3.9  CL 103 100  CO2 24 27  GLUCOSE 108* 105*  BUN 19 17  CREATININE 1.14 1.25  CALCIUM 9.7 9.5   Liver Function Tests: Recent Labs  01/28/14 1030  AST 21  ALT 21  ALKPHOS 72  BILITOT 0.6  PROT 7.4  ALBUMIN 3.9   Cardiac Enzymes: Recent Labs  01/28/14 1225 01/28/14 1634 01/28/14 2201  TROPONINI <0.30 <0.30 <0.30   BNP: Pro B Natriuretic peptide (BNP)  Date/Time Value Ref Range Status  01/28/2014 12:25 PM 59.1  0 - 125 pg/mL Final   Fasting Lipid Panel: Recent Labs  01/29/14 0215  CHOL 88  HDL 30*  LDLCALC 32  TRIG 130  CHOLHDL 2.9   Thyroid Function Tests: Recent Labs  01/28/14 1030  TSH 2.020   TELE:  SR, S brady  while asleep.  Current Medications:  . aspirin EC  81 mg Oral Daily  . hydrochlorothiazide  25 mg Oral Daily  . lisinopril  5 mg Oral Daily  . metoprolol succinate  25 mg Oral Daily  . pantoprazole  40 mg Oral Daily  . rosuvastatin  5 mg Oral q1800  . sodium chloride  3 mL Intravenous Q12H  . sodium chloride  3 mL Intravenous Q12H   . sodium chloride 75 mL/hr at 01/31/14 0400  . heparin 1,400 Units/hr (01/31/14 0100)  . nitroGLYCERIN 5 mcg/min (01/28/14 1905)    ASSESSMENT AND PLAN: Principal Problem:   Chest pain - Repeated episodes, atypical, but hx CAD, for cath this pm. Continue heparin, ASA, nitrates but will try pain Rx for the pain he is having now.   Active Problems:   Morbid obesity - encourage HH diet.    CAD (coronary artery disease) - hx moderate RCA disease, continue CRF reduction    PAD (peripheral artery disease) - on ASA, BB, statin    Hyperlipidemia - continue statin, LDL OK, HDL low, LFTs OK, med  changes per MD    HTN (hypertension) - BP OK now. Up at times, no change in BB w/ bradycardia, on lisinopril/HCTZ. Consider increase this.  Plan - possible d/c in am if feeling better and cath OK.  Lemont Fillers , PA-C 8:42 AM 01/31/2014   Agree with A/P of Rhonda Barrett PA-C. Pt with known CAD admitted with [place]. Enz neg. On IV hep/NTG. Labs otherwise OK. Plan LHC radial later today to R/O an ischemic etiology.

## 2014-01-31 NOTE — Interval H&P Note (Signed)
History and Physical Interval Note:  01/31/2014 2:37 PM  Alfred Carney  has presented today for cardiac cath with the diagnosis of chest pain  The various methods of treatment have been discussed with the patient and family. After consideration of risks, benefits and other options for treatment, the patient has consented to  Procedure(s): LEFT HEART CATHETERIZATION WITH CORONARY ANGIOGRAM (N/A) as a surgical intervention .  The patient's history has been reviewed, patient examined, no change in status, stable for surgery.  I have reviewed the patient's chart and labs.  Questions were answered to the patient's satisfaction.    Cath Lab Visit (complete for each Cath Lab visit)  Clinical Evaluation Leading to the Procedure:   ACS: no  Non-ACS:    Anginal Classification: CCS III  Anti-ischemic medical therapy: Minimal Therapy (1 class of medications)  Non-Invasive Test Results: No non-invasive testing performed  Prior CABG: No previous CABG         Burnell Blanks

## 2014-01-31 NOTE — Progress Notes (Signed)
Order received to discharge patient home.  Patient ambulated 600 feet in hall without difficulty.  Denies CP or SOB with ambulation.  Right radial site is level 0, no bleeding, bruising, or hematoma noted.  VSS.  IV's discontinued with catheter tips intact.  Telemetry monitor D/C'd.  Discharge instructions, medications, follow-up appointment, post-cath instructions, and chest pain management discussed with patient and spouse.  Patient and spouse verbalize understanding of all instructions, and copy of instructions given to patient.  Patient escorted by NT via wheelchair to ED entrance to meet spouse who is driving patient home.  Alfred Carney

## 2014-01-31 NOTE — Discharge Summary (Signed)
Physician Discharge Summary       Patient ID: Blayde Boat MRN: [id number] DOB/AGE: 20-Apr-1951 63 y.o.  Admit date: 01/28/2014 Discharge date: 02/01/2014  Discharge Diagnoses:  Principal Problem:   Chest pain, negative MI, stable CT angio of chest, stable non obstructive CAD -possible GI cause Active Problems:   CAD (coronary artery disease) stable non obstructive CAD   ABDOMINAL AORTIC ANEURYSM, HX OF   Hyperlipidemia   Morbid obesity   PAD (peripheral artery disease)   HTN (hypertension)   Discharged Condition: good  Procedures: 01/31/14 cardiac cath by Dr. Angelena Form  Primary cardiologist:  Dr. Martinique  Hospital Course: 63 y.o. male with a hx of CAD, s/p cardiac catheterization in 2007 showing a 60-70% mid right coronary stenosis. This was unchanged compared to a prior study in 2004. He has a history of peripheral vascular disease and is status post repair of a thoracoabdominal aneurysm with right femoral and left iliac bypass grafting and reimplantation of his renal arteries. Other hx includes HTN, HL, COPD, obesity. Lipitor has caused him to have palpitations in the past. He does have a history of obstructive sleep apnea but hasn't used his CPAP mask in the past. Last seen by Dr. Peter Martinique in 10/2011. Myoview at that time was normal.   He present 01/28/14 for evaluation of chest pain. He had some chronic chest pain since his AAA repair years ago. However, over the last several weeks he's had worsening chest discomfort. This is somewhat different. It feels like a pressing sensation and it radiates to his back in the left scapular area. He has felt some discomfort in his neck. He describes pleuritic chest pain. His symptoms are definitely made worse by minimal activity (CCS class 3-4). Chest discomfort is improved by rest. He has chronic dyspnea (NYHA class IIb-3). He denied any changes. He has had associated nausea with chest discomfort but no diaphoresis. He denied syncope. He denies  orthopnea, PND or edema  Pt was admitted from the office, placed on IV heparin and CT angio of his chest was completed. Troponin I was neg X 3, CT was stable.  Pt then underwent cardiac cath which revealed mild non obstructive disease.  EF 55-60%.  Continued management of CAD and pt was found stable by Dr. Angelena Form for discharge.  He will follow up as an outpatient.  Consults: None  Significant Diagnostic Studies:   Cardiac cath: Right radial artery.  Indication: 63 yo male with history of CAD, PAD, HTN, HLD, COPD, obesity admitted with chest pain. Cardiac markers negative.  Procedure Details:  The risks, benefits, complications, treatment options, and expected outcomes were discussed with the patient. The patient and/or family concurred with the proposed plan, giving informed consent. The patient was brought to the cath lab after IV hydration was begun and oral premedication was given. The patient was further sedated with Versed and Fentanyl. The right wrist was assessed with an Allens test which was positive. The right wrist was prepped and draped in a sterile fashion. 1% lidocaine was used for local anesthesia. Using the modified Seldinger access technique, a 5 French sheath was placed in the right radial artery. 3 mg Verapamil was given through the sheath. 5500 units IV heparin was given. Standard diagnostic catheters were used to perform selective coronary angiography. The left main was engaged with an ERAD Left catheter. A pigtail catheter was used to perform a left ventricular angiogram. The sheath was removed from the right radial artery and a Terumo hemostasis band was  applied at the arteriotomy site on the right wrist.  There were no immediate complications. The patient was taken to the recovery area in stable condition.  Hemodynamic Findings:  Central aortic pressure: 142/66  Left ventricular pressure: 148/4/21  Angiographic Findings:  Left main: Long horizontal left main with no  obstructive disease.  Left Anterior Descending Artery: Large caliber tortuous vessel that barely reaches the apex. The proximal vessel has a 40% stenosis. The mid vessel has diffuse 30% stenosis. The first diagonal branch is moderate in caliber with no obstructive disease. The second diagonal branch is moderate in caliber with mild plaque.  Circumflex Artery: Large caliber vessel with large caliber first obtuse marginal branch without obstructive disease, followed by a moderate caliber second obtuse marginal branch. The second OM branch has 40-50% stenosis just after the takeoff from the AV groove Circumflex.  Right Coronary Artery: Small non-dominant vessel with 40% proximal stenosis, 60% mid stenosis.  Left Ventricular Angiogram: LVEF=55-60%.  Impression:  1. Mild non-obstructive CAD  2. Normal LV systolic function  Recommendations: Continue medical management of CAD. Discharge home later today. Follow up with Dr. Martinique in 2-3 weeks.     EXAM: CT ANGIOGRAPHY CHEST, ABDOMEN AND PELVIS  TECHNIQUE: Multidetector CT imaging through the chest, abdomen and pelvis was performed using the standard protocol during bolus administration of intravenous contrast. Multiplanar reconstructed images and MIPs were obtained and reviewed to evaluate the vascular anatomy.  CONTRAST: 11mL OMNIPAQUE IOHEXOL 350 MG/ML SOLN  COMPARISON: Chest CT - 04/02/2007; 09/21/2004  FINDINGS: Vascular Findings of the chest:  Scattered mixed calcified and noncalcified atherosclerotic plaque within pain tortuous but normal caliber thoracic aorta. Review of the noncontrast images are negative for definitive presence of an intramural hematoma. No definite thoracic aortic dissection or periaortic stranding.  Conventional configuration of the aortic arch. The branch vessels of the aortic arch widely patent throughout their imaged course.  Borderline cardiomegaly. Coronary artery calcifications. No pericardial  effusion.  There is adequate opacification of the pulmonary arteries with the main pulmonary artery measuring 429 Hounsfield units. There are no discrete filling defects within the pulmonary arterial tree to the level of the bilateral subsegmental pulmonary arteries. Evaluation of distal subsegmental pulmonary arteries is degraded secondary to patient respiratory artifact. Normal caliber the main pulmonary artery.  -------------------------------------------------------------  Non-Vascular Findings of the chest:  Evaluation of the pulmonary parenchyma is degraded secondary to patient respiratory artifact. Severe centrilobular and paraseptal emphysematous change. Minimal dependent ground-glass atelectasis. No discrete focal airspace opacities. No pleural effusion or pneumothorax. No evidence of edema. The central pulmonary airways appear widely patent.  No mediastinal, hilar or axillary lymphadenopathy.  No acute or aggressive osseus abnormalities within the chest. 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  ---------------------------------------------------------------  Vascular Findings of the abdomen and pelvis:  Abdominal aorta: Post open surgical repair of abdominal aortic aneurysm. The left limb of the bypass graft is anastomosed with the left common iliac artery wall in the right limb is anastomosed with the right common femoral artery. The proximal and distal ends of the bypass graft appear widely patent. No evidence of abdominal aortic dissection or periaortic stranding.  Celiac artery: There is a minimal amount of eccentric mixed calcified and noncalcified atherosclerotic plaque involving the origin of the celiac artery, not result in hemodynamically significant stenosis. Conventional branching pattern.  SMA: There is eccentric mixed calcified and noncalcified atherosclerotic plaque involving the origin of the SMA, not resulting in a  hemodynamically significant stenosis.  Right Renal artery: Solitary, there is a minimal amount of eccentric calcified plaque involving the origin proximal aspect of the right renal artery, not resulting in hemodynamically significant stenosis.  Left Renal artery: Solitary; there is a minimal amount of eccentric calcified plaque involving the origin of the left renal artery, not result in hemodynamically significant stenosis.  IMA: Expected occluded at its origin with reconstitution via collateral supply from the marginal artery of [place].  Pelvic vasculature: There is a retrograde filling of the bilateral common and internal iliac arteries. As above, the aorto bypass graft is anastomosed on the left side with the left common iliac artery and on the right side with the common femoral artery.  Review of the MIP images confirms the above findings.   --------------------------------------------------------------------------- -----  Nonvascular Findings of the abdomen and pelvis:  Evaluation of the abdominal organs is limited to the arterial phase of enhancement. Normal hepatic contour. No discrete hyperenhancing hepatic lesions. Post cholecystectomy. No intra or extrahepatic biliary duct dilatation. No ascites.  There is symmetric enhancement of the bilateral kidneys. There is geographic atrophy involving the superior poles of the bilateral kidneys as well as the inferior pole left kidney, likely the sequela of remote ischemic etiology. No definite renal stones as postcontrast examination. No discrete lesions. No urinary obstruction or perinephric stranding.  Linear calcification within the medial limb of the left adrenal gland, nonspecific, possibly secondary to prior hemorrhage. No discrete adrenal nodule. Normal appearance of the pancreas and spleen.  Hiatal hernia. Scattered colonic diverticulosis without evidence of diverticulitis. Moderate colonic stool burden without  evidence of obstruction. The distal end of the appendix appears somewhat tethered to the right bypass graft limb but is otherwise normal. No pneumoperitoneum, pneumatosis or portal venous gas.  No retroperitoneal, mesenteric, pelvic or inguinal lymphadenopathy.  Normal appearance of the pelvic organs. No free fluid in the pelvis.  No acute or aggressive osseous abnormalities within the abdomen and pelvis. Moderate to severe multilevel lumbar spine DDD, worse at L5-S1 with disc space height loss, endplate irregularity and posteriorly directed disc osteophyte complexes dislocation. Post left total hip replacement, incompletely evaluated.  There is a dystrophic calcification within the subcutaneous tissues of the left upper abdomen, likely an area peripherally calcified fat necrosis.  IMPRESSION: Chest Impression:  1. No acute cardiopulmonary disease on this motion degraded examination. Specifically, no evidence of pulmonary embolism, thoracic aortic aneurysm or dissection. 2. Advanced emphysematous change. 3. Coronary artery calcifications. Abdomen and pelvis Impression:  1. No acute findings within the abdomen or pelvis. 2. Post open abdominal aortic aneurysm repair without evidence of complication. 3. Geographic defects within the bilateral renal cortices, likely the sequela of remote ischemic etiology. No evidence of urinary obstruction. 4. Hiatal hernia. Colonic diverticulosis without evidence of colonic diverticulitis.      Troponin I neg X 3 <0.30 BMET    Component Value Date/Time   NA 142 01/29/2014 0215   K 3.9 01/29/2014 0215   CL 100 01/29/2014 0215   CO2 27 01/29/2014 0215   GLUCOSE 105* 01/29/2014 0215   BUN 17 01/29/2014 0215   CREATININE 1.25 01/29/2014 0215   CALCIUM 9.5 01/29/2014 0215   GFRNONAA 59* 01/29/2014 0215   GFRAA 69* 01/29/2014 0215    CBC    Component Value Date/Time   WBC 8.5 01/31/2014 0516   RBC 5.09 01/31/2014 0516   HGB 13.6 01/31/2014  0516   HCT 42.2 01/31/2014 0516   PLT 222 01/31/2014 0516   MCV 82.9 01/31/2014 0516  MCH 26.7 01/31/2014 0516   MCHC 32.2 01/31/2014 0516   RDW 16.1* 01/31/2014 0516   LYMPHSABS 2.1 01/28/2014 1030   MONOABS 0.6 01/28/2014 1030   EOSABS 0.1 01/28/2014 1030   BASOSABS 0.0 01/28/2014 1030   T chol 88; TG 130; HDL 30;  LDL 26 TSH 2.02  Discharge Exam: Blood pressure 106/60, pulse 56, temperature 97.8 F (36.6 C), temperature source Oral, resp. rate 17, height 5' 9.69" (1.77 m), weight 307 lb 9.6 oz (139.526 kg), SpO2 96.00%.    Disposition: 01-Home or Self Care     Medication List         aspirin 81 MG tablet  Take 81 mg by mouth daily.     hydrochlorothiazide 25 MG tablet  Commonly known as:  HYDRODIURIL  Take 25 mg by mouth daily.     ibuprofen 200 MG tablet  Commonly known as:  ADVIL,MOTRIN  Take 200-400 mg by mouth every 6 (six) hours as needed for pain.     lisinopril 5 MG tablet  Commonly known as:  PRINIVIL,ZESTRIL  Take 5 mg by mouth daily.     metoprolol succinate 25 MG 24 hr tablet  Commonly known as:  TOPROL-XL  Take 25 mg by mouth daily.     nitroGLYCERIN 0.4 MG SL tablet  Commonly known as:  NITROSTAT  Place 1 tablet (0.4 mg total) under the tongue every 5 (five) minutes x 3 doses as needed for chest pain.     omeprazole 20 MG capsule  Commonly known as:  PRILOSEC  Take 20 mg by mouth daily.     rosuvastatin 5 MG tablet  Commonly known as:  CRESTOR  Take 1 tablet (5 mg total) by mouth daily at 6 PM.       Follow-up Information   Follow up with Peter Martinique, MD. (our office will call with the date and time.)    Specialty:  Cardiology   Contact information:   [address] [place] [address] [phone]        Discharge Instructions:  Heart Healthy diet  Call [facility] [phone] if any bleeding, swelling or drainage at cath site.  May shower, no tub baths for 48 hours for groin sticks.   Take 1 NTG,  under your tongue, while sitting.  If no relief of pain may repeat NTG, one tab every 5 minutes up to 3 tablets total over 15 minutes.  If no relief CALL 911.  If you have dizziness/lightheadness  while taking NTG, stop taking and call 911.         Our office will call with follow up appt date and time.   Signed: Cecilie Kicks Nurse Practitioner-Certified [facility]: [facility] 02/01/2014, 9:21 PM  Time spent on discharge :>30 minutes.

## 2014-01-31 NOTE — CV Procedure (Signed)
      Cardiac Catheterization Operative Report  Alfred Carney [id number] 5/18/20153:21 PM Helane Rima, MD  Procedure Performed:  1. Left Heart Catheterization 2. Selective Coronary Angiography 3. Left ventricular angiogram  Operator: Lauree Chandler, MD  Arterial access site:  Right radial artery.   Indication: 64 yo male with history of CAD, PAD, HTN, HLD, COPD, obesity admitted with chest pain. Cardiac markers negative.                                      Procedure Details: The risks, benefits, complications, treatment options, and expected outcomes were discussed with the patient. The patient and/or family concurred with the proposed plan, giving informed consent. The patient was brought to the cath lab after IV hydration was begun and oral premedication was given. The patient was further sedated with Versed and Fentanyl. The right wrist was assessed with an Allens test which was positive. The right wrist was prepped and draped in a sterile fashion. 1% lidocaine was used for local anesthesia. Using the modified Seldinger access technique, a 5 French sheath was placed in the right radial artery. 3 mg Verapamil was given through the sheath. 5500 units IV heparin was given. Standard diagnostic catheters were used to perform selective coronary angiography. The left main was engaged with an ERAD Left catheter. A pigtail catheter was used to perform a left ventricular angiogram. The sheath was removed from the right radial artery and a Terumo hemostasis band was applied at the arteriotomy site on the right wrist.    There were no immediate complications. The patient was taken to the recovery area in stable condition.   Hemodynamic Findings: Central aortic pressure: 142/66 Left ventricular pressure: 148/4/21  Angiographic Findings:  Left main: Long horizontal left main with no obstructive disease.   Left Anterior Descending Artery: Large caliber tortuous vessel that barely  reaches the apex. The proximal vessel has a 40% stenosis. The mid vessel has diffuse 30% stenosis. The first diagonal branch is moderate in caliber with no obstructive disease. The second diagonal branch is moderate in caliber with mild plaque.    Circumflex Artery: Large caliber vessel with large caliber first obtuse marginal branch without obstructive disease, followed by a moderate caliber second obtuse marginal branch. The second OM branch has 40-50% stenosis just after the takeoff from the AV groove Circumflex.   Right Coronary Artery: Small non-dominant vessel with 40% proximal stenosis, 60% mid stenosis.   Left Ventricular Angiogram: LVEF=55-60%.   Impression: 1. Mild non-obstructive CAD 2. Normal LV systolic function  Recommendations: Continue medical management of CAD. Discharge home later today. Follow up with Dr. Martinique in 2-3 weeks.        Complications:  None. The patient tolerated the procedure well.

## 2014-01-31 NOTE — Progress Notes (Signed)
Patient Name: Alfred Carney Date of Encounter: 01/31/2014  Principal Problem:   Chest pain Active Problems:   Morbid obesity   CAD (coronary artery disease)   PAD (peripheral artery disease)   Hyperlipidemia   HTN (hypertension)    Patient Profile: 63 y.o. male with a hx of 60-70% mid right coronary stenosis. 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 Other hx includes HTN, HL, COPD, obesity and OSA not on CPAP. Admitted 05/15 w/ chest pain. Cath planned this pm.  SUBJECTIVE: Having sharp chest pain. Lower edge of left ribs, through to his back. Worse with deep inspiration. Back slightly tender with ?possible spasm noted.  OBJECTIVE Filed Vitals:   01/30/14 1129 01/30/14 1442 01/30/14 2100 01/31/14 0500  BP: 156/63 143/47 144/100 111/51  Pulse: 63 54 57 61  Temp:  97.7 F (36.5 C) 98.1 F (36.7 C) 97.8 F (36.6 C)  TempSrc:  Oral    Resp:  17 20 20   Height:      Weight:    307 lb 9.6 oz (139.526 kg)  SpO2:  93% 95% 92%    Intake/Output Summary (Last 24 hours) at 01/31/14 0842 Last data filed at 01/30/14 1848  Gross per 24 hour  Intake    426 ml  Output      0 ml  Net    426 ml   Filed Weights   01/29/14 0520 01/30/14 0500 01/31/14 0500  Weight: 313 lb 1.6 oz (142.021 kg) 307 lb 9.6 oz (139.526 kg) 307 lb 9.6 oz (139.526 kg)    PHYSICAL EXAM General: Well developed, well nourished, male in no acute distress. Head: Normocephalic, atraumatic.  Neck: Supple without bruits, JVD not elevated. Lungs:  Resp regular and unlabored, CTA. Heart: RRR, S1, S2, no S3, S4, or murmur; no rub. Abdomen: Soft, non-tender, non-distended, BS + x 4.  Extremities: No clubbing, cyanosis, no edema.  Neuro: Alert and oriented X 3. Moves all extremities spontaneously. Psych: Normal affect.  LABS: CBC: Recent Labs  01/28/14 1030  01/30/14 0537 01/31/14 0516  WBC 8.0  < > 6.8 8.5    NEUTROABS 5.2  --   --   --   HGB 14.8  < > 13.4 13.6  HCT 46.7  < > 41.6 42.2  MCV 84.0  < > 82.9 82.9  PLT 180  < > 220 222  < > = values in this interval not displayed. INR: Recent Labs  01/28/14 1030  INR 6.96   Basic Metabolic Panel: Recent Labs  01/28/14 1030 01/29/14 0215  NA 144 142  K 4.2 3.9  CL 103 100  CO2 24 27  GLUCOSE 108* 105*  BUN 19 17  CREATININE 1.14 1.25  CALCIUM 9.7 9.5   Liver Function Tests: Recent Labs  01/28/14 1030  AST 21  ALT 21  ALKPHOS 72  BILITOT 0.6  PROT 7.4  ALBUMIN 3.9   Cardiac Enzymes: Recent Labs  01/28/14 1225 01/28/14 1634 01/28/14 2201  TROPONINI <0.30 <0.30 <0.30   BNP: Pro B Natriuretic peptide (BNP)  Date/Time Value Ref Range Status  01/28/2014 12:25 PM 59.1  0 - 125 pg/mL Final   Fasting Lipid Panel: Recent Labs  01/29/14 0215  CHOL 88  HDL 30*  LDLCALC 32  TRIG 130  CHOLHDL 2.9   Thyroid Function Tests: Recent Labs  01/28/14 1030  TSH 2.020   TELE:  SR, S brady  while asleep.  Current Medications:  . aspirin EC  81 mg Oral Daily  . hydrochlorothiazide  25 mg Oral Daily  . lisinopril  5 mg Oral Daily  . metoprolol succinate  25 mg Oral Daily  . pantoprazole  40 mg Oral Daily  . rosuvastatin  5 mg Oral q1800  . sodium chloride  3 mL Intravenous Q12H  . sodium chloride  3 mL Intravenous Q12H   . sodium chloride 75 mL/hr at 01/31/14 0400  . heparin 1,400 Units/hr (01/31/14 0100)  . nitroGLYCERIN 5 mcg/min (01/28/14 1905)    ASSESSMENT AND PLAN: Principal Problem:   Chest pain - Repeated episodes, atypical, but hx CAD, for cath this pm. Continue heparin, ASA, nitrates but will try pain Rx for the pain he is having now.   Active Problems:   Morbid obesity - encourage HH diet.    CAD (coronary artery disease) - hx moderate RCA disease, continue CRF reduction    PAD (peripheral artery disease) - on ASA, BB, statin    Hyperlipidemia - continue statin, LDL OK, HDL low, LFTs OK, med  changes per MD    HTN (hypertension) - BP OK now. Up at times, no change in BB w/ bradycardia, on lisinopril/HCTZ. Consider increase this.  Plan - possible d/c in am if feeling better and cath OK.  Lemont Fillers , PA-C 8:42 AM 01/31/2014   Agree with A/P of Rhonda Barrett PA-C. Pt with known CAD admitted with [place]. Enz neg. On IV hep/NTG. Labs otherwise OK. Plan LHC radial later today to R/O an ischemic etiology.

## 2014-01-31 NOTE — Progress Notes (Signed)
ANTICOAGULATION CONSULT NOTE - Follow Up Consult  Pharmacy Consult for Heparin Indication: chest pain/ACS  No Known Allergies Patient Measurements: Heparin Dosing Weight: 105kg Vital Signs: Temp: 97.8 F (36.6 C) (05/18 0500) BP: 111/51 mmHg (05/18 0500) Pulse Rate: 61 (05/18 0500) Labs:  Recent Labs  01/28/14 1030 01/28/14 1225 01/28/14 1634 01/28/14 2201  01/29/14 0215 01/29/14 1550 01/30/14 0537 01/31/14 0516  HGB 14.8  --   --   --   --  13.0  --  13.4 13.6  HCT 46.7  --   --   --   --  40.9  --  41.6 42.2  PLT 180  --   --   --   --  198  --  220 222  APTT 35  --   --   --   --   --   --   --   --   LABPROT 13.7  --   --   --   --   --   --   --   --   INR 1.07  --   --   --   --   --   --   --   --   HEPARINUNFRC  --   --   --   --   < > 0.43 0.38 0.35 0.36  CREATININE 1.14  --   --   --   --  1.25  --   --   --   TROPONINI  --  <0.30 <0.30 <0.30  --   --   --   --   --   < > = values in this interval not displayed.  Estimated Creatinine Clearance: 83.8 ml/min (by C-G formula based on Cr of 1.25).  Medications:  Infusions:  . sodium chloride 75 mL/hr at 01/31/14 0400  . heparin 1,400 Units/hr (01/31/14 0100)  . nitroGLYCERIN 5 mcg/min (01/28/14 1905)    Assessment: 63 YOM with chest pain on IV heparin with plans for cardiac cath today at 1500 PM.  Heparin level is therapeutic on 1400 units/hr. CBC is within normal limits. No bleeding reported.   Goal of Therapy:  Heparin level 0.3-0.7 units/ml Monitor platelets by anticoagulation protocol: Yes   Plan:  Continue heparin at 1400 units/hr. Follow up heparin level and CBC daily. Follow up post-cardiac cath.   Sloan Leiter, PharmD, BCPS Clinical Pharmacist [phone] 01/31/2014,8:49 AM

## 2014-05-05 ENCOUNTER — Emergency Department (HOSPITAL_COMMUNITY): Payer: BC Managed Care – PPO

## 2014-05-05 ENCOUNTER — Encounter (HOSPITAL_COMMUNITY): Payer: Self-pay | Admitting: Emergency Medicine

## 2014-05-05 ENCOUNTER — Inpatient Hospital Stay (HOSPITAL_COMMUNITY)
Admission: EM | Admit: 2014-05-05 | Discharge: 2014-05-10 | DRG: 314 | Disposition: A | Discharge status: Home / Self Care | Payer: BC Managed Care – PPO | Attending: Internal Medicine | Admitting: Internal Medicine

## 2014-05-05 DIAGNOSIS — Z7982 Long term (current) use of aspirin: Secondary | ICD-10-CM | POA: Diagnosis not present

## 2014-05-05 DIAGNOSIS — Z6841 Body Mass Index (BMI) 40.0 and over, adult: Secondary | ICD-10-CM

## 2014-05-05 DIAGNOSIS — Z96649 Presence of unspecified artificial hip joint: Secondary | ICD-10-CM

## 2014-05-05 DIAGNOSIS — I739 Peripheral vascular disease, unspecified: Secondary | ICD-10-CM | POA: Diagnosis present

## 2014-05-05 DIAGNOSIS — Z860100 Personal history of colon polyps, unspecified: Secondary | ICD-10-CM

## 2014-05-05 DIAGNOSIS — I2510 Atherosclerotic heart disease of native coronary artery without angina pectoris: Secondary | ICD-10-CM | POA: Diagnosis present

## 2014-05-05 DIAGNOSIS — G4733 Obstructive sleep apnea (adult) (pediatric): Secondary | ICD-10-CM | POA: Diagnosis present

## 2014-05-05 DIAGNOSIS — Z9861 Coronary angioplasty status: Secondary | ICD-10-CM

## 2014-05-05 DIAGNOSIS — Z8249 Family history of ischemic heart disease and other diseases of the circulatory system: Secondary | ICD-10-CM

## 2014-05-05 DIAGNOSIS — M109 Gout, unspecified: Secondary | ICD-10-CM | POA: Diagnosis present

## 2014-05-05 DIAGNOSIS — M1990 Unspecified osteoarthritis, unspecified site: Secondary | ICD-10-CM | POA: Diagnosis present

## 2014-05-05 DIAGNOSIS — Z85828 Personal history of other malignant neoplasm of skin: Secondary | ICD-10-CM

## 2014-05-05 DIAGNOSIS — K219 Gastro-esophageal reflux disease without esophagitis: Secondary | ICD-10-CM | POA: Diagnosis present

## 2014-05-05 DIAGNOSIS — E785 Hyperlipidemia, unspecified: Secondary | ICD-10-CM | POA: Diagnosis present

## 2014-05-05 DIAGNOSIS — A419 Sepsis, unspecified organism: Secondary | ICD-10-CM | POA: Diagnosis present

## 2014-05-05 DIAGNOSIS — J449 Chronic obstructive pulmonary disease, unspecified: Secondary | ICD-10-CM

## 2014-05-05 DIAGNOSIS — Z833 Family history of diabetes mellitus: Secondary | ICD-10-CM

## 2014-05-05 DIAGNOSIS — R6521 Severe sepsis with septic shock: Secondary | ICD-10-CM

## 2014-05-05 DIAGNOSIS — I10 Essential (primary) hypertension: Secondary | ICD-10-CM | POA: Diagnosis present

## 2014-05-05 DIAGNOSIS — D72829 Elevated white blood cell count, unspecified: Secondary | ICD-10-CM

## 2014-05-05 DIAGNOSIS — T827XXA Infection and inflammatory reaction due to other cardiac and vascular devices, implants and grafts, initial encounter: Secondary | ICD-10-CM | POA: Diagnosis not present

## 2014-05-05 DIAGNOSIS — Z87891 Personal history of nicotine dependence: Secondary | ICD-10-CM | POA: Diagnosis not present

## 2014-05-05 DIAGNOSIS — J438 Other emphysema: Secondary | ICD-10-CM | POA: Diagnosis present

## 2014-05-05 DIAGNOSIS — W19XXXA Unspecified fall, initial encounter: Secondary | ICD-10-CM | POA: Diagnosis present

## 2014-05-05 DIAGNOSIS — R6520 Severe sepsis without septic shock: Secondary | ICD-10-CM

## 2014-05-05 DIAGNOSIS — R509 Fever, unspecified: Secondary | ICD-10-CM

## 2014-05-05 DIAGNOSIS — K7689 Other specified diseases of liver: Secondary | ICD-10-CM | POA: Diagnosis present

## 2014-05-05 DIAGNOSIS — T827XXD Infection and inflammatory reaction due to other cardiac and vascular devices, implants and grafts, subsequent encounter: Secondary | ICD-10-CM

## 2014-05-05 LAB — COMPREHENSIVE METABOLIC PANEL
ALT: 13 U/L (ref 0–53)
AST: 16 U/L (ref 0–37)
Albumin: 3.1 g/dL — ABNORMAL LOW (ref 3.5–5.2)
Alkaline Phosphatase: 68 U/L (ref 39–117)
Anion gap: 17 — ABNORMAL HIGH (ref 5–15)
BUN: 19 mg/dL (ref 6–23)
CO2: 23 mEq/L (ref 19–32)
Calcium: 9 mg/dL (ref 8.4–10.5)
Chloride: 103 mEq/L (ref 96–112)
Creatinine, Ser: 1.27 mg/dL (ref 0.50–1.35)
GFR calc Af Amer: 67 mL/min — ABNORMAL LOW (ref >90)
GFR calc non Af Amer: 58 mL/min — ABNORMAL LOW (ref >90)
Glucose, Bld: 156 mg/dL — ABNORMAL HIGH (ref 70–99)
Potassium: 3.3 mEq/L — ABNORMAL LOW (ref 3.7–5.3)
Sodium: 143 mEq/L (ref 137–147)
Total Bilirubin: 0.6 mg/dL (ref 0.3–1.2)
Total Protein: 6.7 g/dL (ref 6.0–8.3)

## 2014-05-05 LAB — CBC WITH DIFFERENTIAL/PLATELET
Basophils Absolute: 0 10*3/uL (ref 0.0–0.1)
Basophils Relative: 0 % (ref 0–1)
Eosinophils Absolute: 0 10*3/uL (ref 0.0–0.7)
Eosinophils Relative: 0 % (ref 0–5)
HCT: 40.4 % (ref 39.0–52.0)
Hemoglobin: 13.6 g/dL (ref 13.0–17.0)
Lymphocytes Relative: 7 % — ABNORMAL LOW (ref 12–46)
Lymphs Abs: 1.3 10*3/uL (ref 0.7–4.0)
MCH: 27.5 pg (ref 26.0–34.0)
MCHC: 33.7 g/dL (ref 30.0–36.0)
MCV: 81.6 fL (ref 78.0–100.0)
Monocytes Absolute: 1.4 10*3/uL — ABNORMAL HIGH (ref 0.1–1.0)
Monocytes Relative: 8 % (ref 3–12)
Neutro Abs: 15.4 10*3/uL — ABNORMAL HIGH (ref 1.7–7.7)
Neutrophils Relative %: 85 % — ABNORMAL HIGH (ref 43–77)
Platelets: 218 10*3/uL (ref 150–400)
RBC: 4.95 MIL/uL (ref 4.22–5.81)
RDW: 15.4 % (ref 11.5–15.5)
WBC: 18.1 10*3/uL — ABNORMAL HIGH (ref 4.0–10.5)

## 2014-05-05 LAB — URINALYSIS, ROUTINE W REFLEX MICROSCOPIC
Glucose, UA: NEGATIVE mg/dL
Hgb urine dipstick: NEGATIVE
Ketones, ur: 15 mg/dL — AB
Leukocytes, UA: NEGATIVE
Nitrite: NEGATIVE
Protein, ur: 30 mg/dL — AB
Specific Gravity, Urine: 1.024 (ref 1.005–1.030)
Urobilinogen, UA: 1 mg/dL (ref 0.0–1.0)
pH: 5.5 (ref 5.0–8.0)

## 2014-05-05 LAB — I-STAT CG4 LACTIC ACID, ED: Lactic Acid, Venous: 2.11 mmol/L (ref 0.5–2.2)

## 2014-05-05 LAB — URINE MICROSCOPIC-ADD ON

## 2014-05-05 MED ORDER — SODIUM CHLORIDE 0.9 % IV SOLN
1000.0000 mL | INTRAVENOUS | Status: DC
  Administered 2014-05-05: 1000 mL via INTRAVENOUS

## 2014-05-05 MED ORDER — IOHEXOL 300 MG/ML  SOLN
100.0000 mL | Freq: Once | INTRAMUSCULAR | Status: AC | PRN
  Administered 2014-05-05: 100 mL via INTRAVENOUS

## 2014-05-05 MED ORDER — SODIUM CHLORIDE 0.9 % IV BOLUS (SEPSIS)
30.0000 mL/kg | Freq: Once | INTRAVENOUS | Status: AC
  Administered 2014-05-05: 4083 mL via INTRAVENOUS
  Filled 2014-05-05: qty 4200

## 2014-05-05 MED ORDER — PIPERACILLIN-TAZOBACTAM 3.375 G IVPB 30 MIN
3.3750 g | Freq: Once | INTRAVENOUS | Status: AC
  Administered 2014-05-05: 3.375 g via INTRAVENOUS
  Filled 2014-05-05: qty 50

## 2014-05-05 MED ORDER — IOHEXOL 300 MG/ML  SOLN
25.0000 mL | Freq: Once | INTRAMUSCULAR | Status: AC | PRN
  Administered 2014-05-05: 25 mL via ORAL

## 2014-05-05 MED ORDER — ACETAMINOPHEN 500 MG PO TABS
1000.0000 mg | ORAL_TABLET | Freq: Once | ORAL | Status: AC
  Administered 2014-05-05: 1000 mg via ORAL
  Filled 2014-05-05: qty 2

## 2014-05-05 MED ORDER — IOHEXOL 350 MG/ML SOLN
100.0000 mL | Freq: Once | INTRAVENOUS | Status: AC | PRN
  Administered 2014-05-05: 100 mL via INTRAVENOUS

## 2014-05-05 NOTE — ED Notes (Signed)
Attempted report 

## 2014-05-05 NOTE — Consult Note (Signed)
Reason for Consult:fever and abnormality on CT scan Referring Physician: Jola Schmidt  Alfred Carney is an 63 y.o. male.  HPI: Patient is status post aortobiiliac graft in 2003 done for an 8 cm abdominal aortic aneurysm.Marland Kitchen He does not remember his surgeon's name but says it was done here. He has multiple medical problems. His cardiologist is Dr. Martinique. He developed generalized malaise today including achiness all over. He initially denied abdominal pain but on further questioning seems to have some right flank pain earlier today. His wife took his temperature and it was over 102. He bacame lethargic with confusion and he came to the emergency department. He currently denies abdominal pain. Workup included white blood cell count showing leukocytosis of 18,100. CT scan of the abdomen and pelvis was done which demonstrates an inflammatory process involving the right common iliac branch of his graft. There is a 5 cm inflammatory process which may be an early abscess. Additionally, the tip of his appendix lies in this area of inflammation. The majority of his appendix is normal in appearance but the tip enters this inflammatory area.  Past Medical History  Diagnosis Date  . Morbid obesity   . Chronic obstructive pulmonary disease 2008    Moderate  . Community acquired pneumonia   . Hypertension   . Hyperlipidemia   . Degenerative joint disease   . Coronary artery disease   . Gastroesophageal reflux disease   . 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 Hypokalemia     diuretic induced, resolved  . Shortness of breath   . Gout   . Colon polyps   . H/O hiatal hernia   . Enlarged liver     fatty liver by '09 CT  . Sleep apnea     not currently using CPAP 05/20/13  . Emphysema lung   . Cancer     skin cancer    Past Surgical History  Procedure Laterality Date  .  Tonsillectomy    . Cholecystectomy    . Hip surgery      Status post left hip surgery with bone grafting  . Cardiac catheterization  2007    Ejection fraction is estimated at 60%  . Thoracoabdominal aortic aneurysm repair      with right femoral and left iliac BPG and reimplantation of renal arteries.  Marland Kitchen Hardware removal Left 05/26/2013    Procedure: HARDWARE REMOVAL;  Surgeon: Newt Minion, MD;  Location: [facility];  Service: Orthopedics;  Laterality: Left;  Left Total Hip Arthroplasty, Removal of Deep Hardware  . Total hip arthroplasty Left 05/26/2013    Procedure: TOTAL HIP ARTHROPLASTY;  Surgeon: Newt Minion, MD;  Location: [facility];  Service: Orthopedics;  Laterality: Left;  Left Total Hip Arthroplasty, Removal Deep Hardware    Family History  Problem Relation Age of Onset  . Osteoarthritis Mother   . Diabetes Mother   . Heart disease Father     Coronary Artery Disease  . Multiple sclerosis Sister   . Factor V Leiden deficiency Sister     Social History:  reports that he has quit smoking. He has never used smokeless tobacco. He reports that he does not drink alcohol or use illicit drugs.  Allergies: No Known Allergies  Medications: Prior to Admission:  (Not in a hospital admission)  Results for orders placed during the hospital encounter  of 05/05/14 (from the past 48 hour(s))  CBC WITH DIFFERENTIAL     Status: Abnormal   Collection Time    05/05/14  3:03 PM      Result Value Ref Range   WBC 18.1 (*) 4.0 - 10.5 K/uL   RBC 4.95  4.22 - 5.81 MIL/uL   Hemoglobin 13.6  13.0 - 17.0 g/dL   HCT 40.4  39.0 - 52.0 %   MCV 81.6  78.0 - 100.0 fL   MCH 27.5  26.0 - 34.0 pg   MCHC 33.7  30.0 - 36.0 g/dL   RDW 15.4  11.5 - 15.5 %   Platelets 218  150 - 400 K/uL   Neutrophils Relative % 85 (*) 43 - 77 %   Neutro Abs 15.4 (*) 1.7 - 7.7 K/uL   Lymphocytes Relative 7 (*) 12 - 46 %   Lymphs Abs 1.3  0.7 - 4.0 K/uL   Monocytes Relative 8  3 - 12 %   Monocytes Absolute 1.4 (*) 0.1 - 1.0  K/uL   Eosinophils Relative 0  0 - 5 %   Eosinophils Absolute 0.0  0.0 - 0.7 K/uL   Basophils Relative 0  0 - 1 %   Basophils Absolute 0.0  0.0 - 0.1 K/uL  COMPREHENSIVE METABOLIC PANEL     Status: Abnormal   Collection Time    05/05/14  3:03 PM      Result Value Ref Range   Sodium 143  137 - 147 mEq/L   Potassium 3.3 (*) 3.7 - 5.3 mEq/L   Chloride 103  96 - 112 mEq/L   CO2 23  19 - 32 mEq/L   Glucose, Bld 156 (*) 70 - 99 mg/dL   BUN 19  6 - 23 mg/dL   Creatinine, Ser 1.27  0.50 - 1.35 mg/dL   Calcium 9.0  8.4 - 10.5 mg/dL   Total Protein 6.7  6.0 - 8.3 g/dL   Albumin 3.1 (*) 3.5 - 5.2 g/dL   AST 16  0 - 37 U/L   ALT 13  0 - 53 U/L   Alkaline Phosphatase 68  39 - 117 U/L   Total Bilirubin 0.6  0.3 - 1.2 mg/dL   GFR calc non Af Amer 58 (*) >90 mL/min   GFR calc Af Amer 67 (*) >90 mL/min   Comment: (NOTE)     The eGFR has been calculated using the CKD EPI equation.     This calculation has not been validated in all clinical situations.     eGFR's persistently <90 mL/min signify possible Chronic Kidney     Disease.   Anion gap 17 (*) 5 - 15  I-STAT CG4 LACTIC ACID, ED     Status: None   Collection Time    05/05/14  3:58 PM      Result Value Ref Range   Lactic Acid, Venous 2.11  0.5 - 2.2 mmol/L  URINALYSIS, ROUTINE W REFLEX MICROSCOPIC     Status: Abnormal   Collection Time    05/05/14  5:06 PM      Result Value Ref Range   Color, Urine AMBER (*) YELLOW   Comment: BIOCHEMICALS MAY BE AFFECTED BY COLOR   APPearance CLEAR  CLEAR   Specific Gravity, Urine 1.024  1.005 - 1.030   pH 5.5  5.0 - 8.0   Glucose, UA NEGATIVE  NEGATIVE mg/dL   Hgb urine dipstick NEGATIVE  NEGATIVE   Bilirubin Urine SMALL (*) NEGATIVE  Ketones, ur 15 (*) NEGATIVE mg/dL   Protein, ur 30 (*) NEGATIVE mg/dL   Urobilinogen, UA 1.0  0.0 - 1.0 mg/dL   Nitrite NEGATIVE  NEGATIVE   Leukocytes, UA NEGATIVE  NEGATIVE  URINE MICROSCOPIC-ADD ON     Status: Abnormal   Collection Time    05/05/14  5:06 PM       Result Value Ref Range   Squamous Epithelial / LPF FEW (*) RARE   WBC, UA 0-2  <3 WBC/hpf   RBC / HPF 0-2  <3 RBC/hpf   Bacteria, UA FEW (*) RARE   Casts GRANULAR CAST (*) NEGATIVE   Comment: HYALINE CASTS   Urine-Other MUCOUS PRESENT      Ct Abdomen Pelvis W Contrast  05/05/2014   CLINICAL DATA:  Right lower quadrant pain, fever, hypertension  EXAM: CT ABDOMEN AND PELVIS WITH CONTRAST  TECHNIQUE: Multidetector CT imaging of the abdomen and pelvis was performed using the standard protocol following bolus administration of intravenous contrast.  CONTRAST:  126m OMNIPAQUE IOHEXOL 300 MG/ML  SOLN  COMPARISON:  CT 01/28/2014  FINDINGS: Dominant finding in the abdomen pelvis is a inflammatory process just ventral to the right common iliac artery graft repair. This inflammatory response measures approximately 5.0 x 3.6 cm (image 62, series 2. They appendix leads up to this inflammatory response stent is most likely certainly involved in this inflammatory infectious process. The proximal appendix appear normal. There is a small focus of and inflammation at this level on CTA of 01/28/2014.  There is small amount of fluid along the right pericolic gutter extending along the right iliac vessels (image 71, series 2.  Lung bases are clear. No focal hepatic lesion. Patient status post cholecystectomy. The pancreas, spleen, adrenal glands, and kidneys are normal.  The stomach, small bowel, colon are normal. No free fluid the pelvis. Prostate gland and bladder normal. There is streak artifact generated from the prosthetic on the left. No aggressive osseous lesion.  IMPRESSION: Inflammatory/infectious process involving the tip of the appendix at the level of the aortic graft right common iliac branch. Difficult to tell if the graft is infected such as a mycotic aneurysm and secondary inflammation of the appendix or if the tip of the appendix is infected with secondary involvement of the graft. Recommend both  vascular surgery and general surgery consultation in addressing this acute process. CTA of the abdomen may help define the aortic graft involvement.  Findings conveyed toDAN FLOYD on 05/05/2014  at18:50.   Electronically Signed   By: SSuzy BouchardM.D.   On: 05/05/2014 18:51   Dg Chest Port 1 View  05/05/2014   CLINICAL DATA:  FEVER HYPOTENSION. Shortness of breath and dizziness for 1 day. History of COPD, hypertension, emphysema, cancer.  EXAM: PORTABLE CHEST - 1 VIEW  COMPARISON:  01/28/2014 and 05/20/2013  FINDINGS: Heart size is normal. There is chronic change at the left lung base. There is pulmonary vascular congestion but no overt edema.  IMPRESSION: Stable appearance of the chest.   Electronically Signed   By: BShon HaleM.D.   On: 05/05/2014 15:50    Review of Systems  Constitutional: Positive for fever and malaise/fatigue. Negative for chills.  HENT: Negative.   Eyes: Negative.   Respiratory: Negative.   Cardiovascular: Negative.   Gastrointestinal: Negative for nausea, vomiting, abdominal pain, diarrhea and constipation.  Genitourinary: Negative.   Musculoskeletal: Negative.   Skin: Negative.   Neurological:       See HPI  Endo/Heme/Allergies: Negative.  Psychiatric/Behavioral: Negative.    Blood pressure 122/69, pulse 68, temperature 98.2 F (36.8 C), temperature source Oral, resp. rate 21, height 5' 10"  (1.778 m), weight 300 lb (136.079 kg), SpO2 97.00%. Physical Exam  Constitutional: He is oriented to person, place, and time. He appears well-developed and well-nourished. No distress.  HENT:  Head: Normocephalic and atraumatic.  Nose: Nose normal.  Mouth/Throat: Oropharynx is clear and moist. No oropharyngeal exudate.  Eyes: EOM are normal. Pupils are equal, round, and reactive to light. No scleral icterus.  Neck: Normal range of motion. Neck supple. No tracheal deviation present.  Cardiovascular: Normal rate, normal heart sounds and intact distal pulses.   Pitting  peripheral edema bilateral lower extremities but palpable pulses  Respiratory: Effort normal and breath sounds normal. No stridor. No respiratory distress. He has no wheezes. He has no rales.  GI: Soft. He exhibits no distension. There is no tenderness. There is no rebound and no guarding.  Healed midline and low right subcostal incisions, no appreciable tenderness in the right lower quadrant or elsewhere  Musculoskeletal: He exhibits edema.  See above  Neurological: He is alert and oriented to person, place, and time. He exhibits normal muscle tone.  Speech fluent, follows commands  Skin: Skin is warm and dry.  Psychiatric: He has a normal mood and affect.    Assessment/Plan: Inflammatory process involving the right common iliac limb of aortic graft which also involves the tip of the appendix. On careful review of his CTA done in May of this year with the radiologist, there appears to be a small amount of inflammation in this area at that time as well.  His history and physical exam are not consistent with appendicitis and the majority of the appendix looks normal on CT. That being said, the tip of his appendix is involved in this inflammatory process. I have consulted Dr. Bridgett Larsson from vascular surgery for his opinion. At this point, in light of his multiple medical problems, I recommend medical admission, IV antibiotics such as Zosyn, and to keep him n.p.o. We will await Dr. Lianne Moris opinion regarding possible need for appendectomy this admission. I spoke with his family and answered their questions.  Alfred Carney E 05/05/2014, 7:34 PM

## 2014-05-05 NOTE — H&P (Signed)
[facility] History and Physical  Alfred Carney IRS:[id number] DOB: 1951-06-15 DOA: 05/05/2014  Referring physician: ER physician. PCP: Helane Rima, MD   Chief Complaint: Weakness and fever.  HPI: Alfred Carney is a 63 y.o. male with history of CAD status post stenting, abdominal aneurysm status post repair with grafting, peripheral arterial disease, hyperlipidemia, hypertension, COPD and OSA has been running mild fever over the last one week as checked by patient's wife every day. Today patient had a fever of 103F at his house and also was feeling weak confused and had some abdominal discomfort. Denies any nausea vomiting or diarrhea chest pain or shortness of breath or any productive cough or any dysuria. In the ER patient was found to be febrile and initially was hypotensive and had to be given 4 L normal saline bolus. CT abdomen and pelvis done showed features concerning for appendix inflammation and on-call surgeon Dr. Grandville Silos was consulted. Dr. Grandville Silos consulted on call vascular surgeon concerning for right iliac graft infection. Dr. Bridgett Larsson, vascular surgeon has seen the patient and at this time has recommended IV antibiotics and keeping patient n.p.o. and they will be seeing patient in consult.   Review of Systems: As presented in the history of presenting illness, rest negative.  Past Medical History  Diagnosis Date  . Morbid obesity   . Chronic obstructive pulmonary disease 2008    Moderate  . Community acquired pneumonia   . Hypertension   . Hyperlipidemia   . Degenerative joint disease   . Coronary artery disease   . Gastroesophageal reflux disease   . 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 Hypokalemia     diuretic induced, resolved  . Shortness of breath   . Gout   . Colon polyps   . H/O hiatal hernia   . Enlarged liver     fatty liver  by '09 CT  . Sleep apnea     not currently using CPAP 05/20/13  . Emphysema lung   . Cancer     skin cancer   Past Surgical History  Procedure Laterality Date  . Tonsillectomy    . Cholecystectomy    . Hip surgery      Status post left hip surgery with bone grafting  . Cardiac catheterization  2007    Ejection fraction is estimated at 60%  . Thoracoabdominal aortic aneurysm repair      with right femoral and left iliac BPG and reimplantation of renal arteries.  Marland Kitchen Hardware removal Left 05/26/2013    Procedure: HARDWARE REMOVAL;  Surgeon: Newt Minion, MD;  Location: [facility];  Service: Orthopedics;  Laterality: Left;  Left Total Hip Arthroplasty, Removal of Deep Hardware  . Total hip arthroplasty Left 05/26/2013    Procedure: TOTAL HIP ARTHROPLASTY;  Surgeon: Newt Minion, MD;  Location: [facility];  Service: Orthopedics;  Laterality: Left;  Left Total Hip Arthroplasty, Removal Deep Hardware   Social History:  reports that he has quit smoking. He has never used smokeless tobacco. He reports that he does not drink alcohol or use illicit drugs. Where does patient live home. Can patient participate in ADLs? Yes.  No Known Allergies  Family History:  Family History  Problem Relation Age of Onset  . Osteoarthritis Mother   . Diabetes Mother   . Heart disease Father     Coronary Artery Disease  .  Multiple sclerosis Sister   . Factor V Leiden deficiency Sister       Prior to Admission medications   Medication Sig Start Date End Date Taking? Authorizing Provider  aspirin 325 MG tablet Take 650 mg by mouth once.   Yes Historical Provider, MD  aspirin EC 81 MG tablet Take 81 mg by mouth daily.   Yes Historical Provider, MD  hydrochlorothiazide (HYDRODIURIL) 25 MG tablet Take 25 mg by mouth daily.   Yes Historical Provider, MD  ibuprofen (ADVIL,MOTRIN) 200 MG tablet Take 200-400 mg by mouth every 6 (six) hours as needed for pain.   Yes Historical Provider, MD  lisinopril (PRINIVIL,ZESTRIL) 5  MG tablet Take 5 mg by mouth daily.   Yes Historical Provider, MD  metoprolol succinate (TOPROL-XL) 25 MG 24 hr tablet Take 25 mg by mouth daily.   Yes Historical Provider, MD  nitroGLYCERIN (NITROSTAT) 0.4 MG SL tablet Place 1 tablet (0.4 mg total) under the tongue every 5 (five) minutes x 3 doses as needed for chest pain. 01/31/14  Yes Cecilie Kicks, NP  omeprazole (PRILOSEC) 20 MG capsule Take 20 mg by mouth daily.   Yes Historical Provider, MD  rosuvastatin (CRESTOR) 5 MG tablet Take 1 tablet (5 mg total) by mouth daily at 6 PM. 01/31/14  Yes Cecilie Kicks, NP    Physical Exam: Filed Vitals:   05/05/14 2145 05/05/14 2215 05/05/14 2245 05/05/14 2300  BP: 142/70 144/75 161/82 145/69  Pulse: 69 69 81 73  Temp:      TempSrc:      Resp: 17 19 22    Height:      Weight:      SpO2: 97% 99% 96% 92%     General:  Obese not in distress.  Eyes: Anicteric no pallor.  ENT: No discharge from the ears eyes nose mouth.  Neck: No mass felt.  Cardiovascular: S1-S2 heard.  Respiratory: No rhonchi or crepitations.  Abdomen: Soft nontender bowel sounds present. No guarding or rigidity.  Skin: No rash.  Musculoskeletal: No edema.  Psychiatric: Appears normal.  Neurologic: Alert awake oriented to time place and person. Moves all extremities.  Labs on Admission:  Basic Metabolic Panel:  Recent Labs Lab 05/05/14 1503  NA 143  K 3.3*  CL 103  CO2 23  GLUCOSE 156*  BUN 19  CREATININE 1.27  CALCIUM 9.0   Liver Function Tests:  Recent Labs Lab 05/05/14 1503  AST 16  ALT 13  ALKPHOS 68  BILITOT 0.6  PROT 6.7  ALBUMIN 3.1*   No results found for this basename: LIPASE, AMYLASE,  in the last 168 hours No results found for this basename: AMMONIA,  in the last 168 hours CBC:  Recent Labs Lab 05/05/14 1503  WBC 18.1*  NEUTROABS 15.4*  HGB 13.6  HCT 40.4  MCV 81.6  PLT 218   Cardiac Enzymes: No results found for this basename: CKTOTAL, CKMB, CKMBINDEX, TROPONINI,  in  the last 168 hours  BNP (last 3 results)  Recent Labs  01/28/14 1225  PROBNP 59.1   CBG: No results found for this basename: GLUCAP,  in the last 168 hours  Radiological Exams on Admission: Ct Abdomen Pelvis W Contrast  05/05/2014   CLINICAL DATA:  Right lower quadrant pain, fever, hypertension  EXAM: CT ABDOMEN AND PELVIS WITH CONTRAST  TECHNIQUE: Multidetector CT imaging of the abdomen and pelvis was performed using the standard protocol following bolus administration of intravenous contrast.  CONTRAST:  156mL OMNIPAQUE IOHEXOL 300 MG/ML  SOLN  COMPARISON:  CT 01/28/2014  FINDINGS: Dominant finding in the abdomen pelvis is a inflammatory process just ventral to the right common iliac artery graft repair. This inflammatory response measures approximately 5.0 x 3.6 cm (image 62, series 2. They appendix leads up to this inflammatory response stent is most likely certainly involved in this inflammatory infectious process. The proximal appendix appear normal. There is a small focus of and inflammation at this level on CTA of 01/28/2014.  There is small amount of fluid along the right pericolic gutter extending along the right iliac vessels (image 71, series 2.  Lung bases are clear. No focal hepatic lesion. Patient status post cholecystectomy. The pancreas, spleen, adrenal glands, and kidneys are normal.  The stomach, small bowel, colon are normal. No free fluid the pelvis. Prostate gland and bladder normal. There is streak artifact generated from the prosthetic on the left. No aggressive osseous lesion.  IMPRESSION: Inflammatory/infectious process involving the tip of the appendix at the level of the aortic graft right common iliac branch. Difficult to tell if the graft is infected such as a mycotic aneurysm and secondary inflammation of the appendix or if the tip of the appendix is infected with secondary involvement of the graft. Recommend both vascular surgery and general surgery consultation in  addressing this acute process. CTA of the abdomen may help define the aortic graft involvement.  Findings conveyed toDAN FLOYD on 05/05/2014  at18:50.   Electronically Signed   By: Suzy Bouchard M.D.   On: 05/05/2014 18:51   Dg Chest Port 1 View  05/05/2014   CLINICAL DATA:  FEVER HYPOTENSION. Shortness of breath and dizziness for 1 day. History of COPD, hypertension, emphysema, cancer.  EXAM: PORTABLE CHEST - 1 VIEW  COMPARISON:  01/28/2014 and 05/20/2013  FINDINGS: Heart size is normal. There is chronic change at the left lung base. There is pulmonary vascular congestion but no overt edema.  IMPRESSION: Stable appearance of the chest.   Electronically Signed   By: Shon Hale M.D.   On: 05/05/2014 15:50   Ct Cta Abd/pel W/cm &/or W/o Cm  05/05/2014   CLINICAL DATA:  Fever and hypertension. Concern for aortic graft infection.  EXAM: CTA ABDOMEN AND PELVIS wITHOUT AND WITH CONTRAST  TECHNIQUE: Multidetector CT imaging of the abdomen and pelvis was performed using the standard protocol during bolus administration of intravenous contrast. Multiplanar reconstructed images and MIPs were obtained and reviewed to evaluate the vascular anatomy.  CONTRAST:  133mL OMNIPAQUE IOHEXOL 350 MG/ML SOLN  COMPARISON:  05/05/2014  FINDINGS: BODY WALL: Remote fat necrosis in the left lower anterior chest wall.  LOWER CHEST: Coarse reticular opacities in the lower lungs.  There is a moderate size hiatal hernia.  ABDOMEN/PELVIS:  Liver: No focal abnormality.  Biliary: Cholecystectomy.  Pancreas: Unremarkable.  Spleen: Unremarkable.  Adrenals: Calcification within the left adrenal gland, usually post infectious or posttraumatic.  Kidneys and ureters: Bilateral renal cortical scarring. No hydronephrosis. The right ureter passes mainly inferior to the retroperitoneal inflammatory process.  Bladder: Unremarkable.  Reproductive: Unremarkable.  Bowel: No bowel obstruction or perforation. Appendiceal and small bowel findings below.   Retroperitoneum: Dystrophic calcifications in the left upper quadrant.  Peritoneum: No ascites or pneumoperitoneum.  Vascular: Status post aorto bi-iliac grafting. There is thick fat stranding and heterogeneous density, likely with enhancement, around right common iliac artery graft. The thickened tip of the appendix is indistinguishable from the inflammation, as is a loop of small bowel. There is no contrast extravasation or  pseudoaneurysm. Inflammatory changes dissect along the retroperitoneum of the right pelvis. The graft is widely patent. Circumferential thickening at the level of the lower abdominal aorta which is stable from previous, reportedly this is a combination of native and grafted aorta. No perigraft gas. No arterial contrast within the bowel wall when accounting for oral enteric contrast. With the benefit of current imaging, early inflammatory changes present 01/28/2014. The infection could be related to primary graft infection or a neighboring small bowel diverticulitis. These findings argue against obstructive appendicitis given the temporality.  OSSEOUS: No acute abnormalities.  Review of the MIP images confirms the above findings.  IMPRESSION: Aorto bi-iliac grafting with infection of the right iliac limb. In light of previous imaging, inflammation of the appendiceal tip is considered secondary, as discussed above. There is phlegmon around the graft but no abscess. Phlegmon involves the small bowel, but no evidence of fistula.   Electronically Signed   By: Jorje Guild M.D.   On: 05/05/2014 21:45     Assessment/Plan Principal Problem:   Sepsis Active Problems:   COPD   CAD (coronary artery disease) stable non obstructive CAD   PAD (peripheral artery disease)   Infected aortic graft   1. Sepsis from infection of the right iliac limb of the aortobiiliac grafting - patient has been placed on vancomycin and Zosyn after blood cultures obtained. Patient will be kept n.p.o. as requested  by surgery. Further recommendations per vascular surgery and general surgery. 2. CAD - denies any chest pain. On aspirin. Since patient was initially hypotensive antihypertensives have been held. 3. Hyperlipidemia - continue statins once patient can take by mouth. 4. COPD - not wheezing at this time. 5. OSA - patient states he does not use CPAP at home.    Code Status: Full code.  Family Communication: Patient's family at the bedside.  Disposition Plan: Admit to inpatient.    KAKRAKANDY,ARSHAD N. [facility] Pager [phone].  If 7PM-7AM, please contact night-coverage [email or web address] Password [email or web address] 05/05/2014, 11:13 PM

## 2014-05-05 NOTE — ED Notes (Signed)
Pt brought via EMS for fever and near-syncope.  Pt's wife was attempting to take him to pcp for fever over 103 for several days.  On the way to the car pt became weak and fell (denies hitting head - no nd for spine board per ems).  CBG 131, EKG unremarkable.  EMS states sats of 89% on RA that increased to 91% with 2L [place].  Pt complains only of joints in his arms.

## 2014-05-05 NOTE — ED Notes (Signed)
CT called.  Pt finished drinking contrast.

## 2014-05-05 NOTE — ED Provider Notes (Signed)
CSN: [id number]     Arrival date & time 05/05/14  1443 History   None    Chief Complaint  Patient presents with  . Fever  . Hypotension     (Consider location/radiation/quality/duration/timing/severity/associated sxs/prior Treatment) Patient is a 63 y.o. male presenting with general illness. The history is provided by the patient.  Illness Severity:  Severe Onset quality:  Sudden Duration:  3 days Timing:  Constant Progression:  Unchanged Chronicity:  New Associated symptoms: abdominal pain (RLQ pain), fever, headaches and myalgias   Associated symptoms: no chest pain, no congestion, no diarrhea, no rash, no shortness of breath and no vomiting    63 yo M with the chief complaint of fevers and right lower quadrant tenderness. Patient states on for the past 3 or 4 days. Patient with some generalized myalgias headaches associated with this. Patient denies any neck pain denies any vomiting has had some nausea with it. Patient denies any diarrhea or dark stools blood in stool. Patient denies any recent tick bites.  Past Medical History  Diagnosis Date  . Morbid obesity   . Chronic obstructive pulmonary disease 2008    Moderate  . Community acquired pneumonia   . Hypertension   . Hyperlipidemia   . Degenerative joint disease   . Coronary artery disease   . Gastroesophageal reflux disease   . 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 Hypokalemia     diuretic induced, resolved  . Shortness of breath   . Gout   . Colon polyps   . H/O hiatal hernia   . Enlarged liver     fatty liver by '09 CT  . Sleep apnea     not currently using CPAP 05/20/13  . Emphysema lung   . Cancer     skin cancer   Past Surgical History  Procedure Laterality Date  . Tonsillectomy    . Cholecystectomy    . Hip surgery      Status post left hip surgery with bone grafting  .  Cardiac catheterization  2007    Ejection fraction is estimated at 60%  . Thoracoabdominal aortic aneurysm repair      with right femoral and left iliac BPG and reimplantation of renal arteries.  Marland Kitchen Hardware removal Left 05/26/2013    Procedure: HARDWARE REMOVAL;  Surgeon: Newt Minion, MD;  Location: [facility];  Service: Orthopedics;  Laterality: Left;  Left Total Hip Arthroplasty, Removal of Deep Hardware  . Total hip arthroplasty Left 05/26/2013    Procedure: TOTAL HIP ARTHROPLASTY;  Surgeon: Newt Minion, MD;  Location: [facility];  Service: Orthopedics;  Laterality: Left;  Left Total Hip Arthroplasty, Removal Deep Hardware   Family History  Problem Relation Age of Onset  . Osteoarthritis Mother   . Diabetes Mother   . Heart disease Father     Coronary Artery Disease  . Multiple sclerosis Sister   . Factor V Leiden deficiency Sister    History  Substance Use Topics  . Smoking status: Former Research scientist (life sciences)  . Smokeless tobacco: Never Used  . Alcohol Use: No    Review of Systems  Constitutional: Positive for fever. Negative for chills.  HENT: Negative for congestion and facial swelling.   Eyes: Negative for discharge and visual disturbance.  Respiratory: Negative for shortness of breath.   Cardiovascular: Negative for chest pain and palpitations.  Gastrointestinal: Positive for abdominal pain (RLQ pain). Negative for vomiting and diarrhea.  Musculoskeletal: Positive for arthralgias and myalgias.  Skin: Negative for color change and rash.  Neurological: Positive for headaches. Negative for tremors and syncope.  Psychiatric/Behavioral: Negative for confusion and dysphoric mood.      Allergies  Review of patient's allergies indicates no known allergies.  Home Medications   Prior to Admission medications   Medication Sig Start Date End Date Taking? Authorizing Provider  aspirin 325 MG tablet Take 650 mg by mouth once.   Yes Historical Provider, MD  aspirin EC 81 MG tablet Take 81 mg by  mouth daily.   Yes Historical Provider, MD  hydrochlorothiazide (HYDRODIURIL) 25 MG tablet Take 25 mg by mouth daily.   Yes Historical Provider, MD  ibuprofen (ADVIL,MOTRIN) 200 MG tablet Take 200-400 mg by mouth every 6 (six) hours as needed for pain.   Yes Historical Provider, MD  lisinopril (PRINIVIL,ZESTRIL) 5 MG tablet Take 5 mg by mouth daily.   Yes Historical Provider, MD  metoprolol succinate (TOPROL-XL) 25 MG 24 hr tablet Take 25 mg by mouth daily.   Yes Historical Provider, MD  nitroGLYCERIN (NITROSTAT) 0.4 MG SL tablet Place 1 tablet (0.4 mg total) under the tongue every 5 (five) minutes x 3 doses as needed for chest pain. 01/31/14  Yes Cecilie Kicks, NP  omeprazole (PRILOSEC) 20 MG capsule Take 20 mg by mouth daily.   Yes Historical Provider, MD  rosuvastatin (CRESTOR) 5 MG tablet Take 1 tablet (5 mg total) by mouth daily at 6 PM. 01/31/14  Yes Cecilie Kicks, NP   BP 127/56  Pulse 71  Temp(Src) 98.6 F (37 C) (Oral)  Resp 22  Ht 5\' 10"  (1.778 m)  Wt 300 lb (136.079 kg)  BMI 43.05 kg/m2  SpO2 95% Physical Exam  Constitutional: He is oriented to person, place, and time. He appears well-developed and well-nourished.  HENT:  Head: Normocephalic and atraumatic.  Eyes: EOM are normal. Pupils are equal, round, and reactive to light.  Neck: Normal range of motion. Neck supple. No JVD present.  Cardiovascular: Normal rate and regular rhythm.  Exam reveals no gallop and no friction rub.   No murmur heard. Pulmonary/Chest: No respiratory distress. He has no wheezes.  Abdominal: He exhibits no distension. There is tenderness (RLQ). There is no rebound and no guarding.  Obese   Musculoskeletal: Normal range of motion.  Neurological: He is alert and oriented to person, place, and time.  Skin: No rash noted. No pallor.  Psychiatric: He has a normal mood and affect. His behavior is normal.    ED Course  Procedures (including critical care time) Labs Review Labs Reviewed  CBC WITH  DIFFERENTIAL - Abnormal; Notable for the following:    WBC 18.1 (*)    Neutrophils Relative % 85 (*)    Neutro Abs 15.4 (*)    Lymphocytes Relative 7 (*)    Monocytes Absolute 1.4 (*)    All other components within normal limits  COMPREHENSIVE METABOLIC PANEL - Abnormal; Notable for the following:    Potassium 3.3 (*)    Glucose, Bld 156 (*)    Albumin 3.1 (*)    GFR calc non Af Amer 58 (*)    GFR calc Af Amer 67 (*)    Anion gap 17 (*)    All other components within normal limits  URINALYSIS, ROUTINE W REFLEX MICROSCOPIC - Abnormal; Notable for the following:    Color, Urine AMBER (*)  Bilirubin Urine SMALL (*)    Ketones, ur 15 (*)    Protein, ur 30 (*)    All other components within normal limits  URINE MICROSCOPIC-ADD ON - Abnormal; Notable for the following:    Squamous Epithelial / LPF FEW (*)    Bacteria, UA FEW (*)    Casts GRANULAR CAST (*)    All other components within normal limits  CULTURE, BLOOD (ROUTINE X 2)  CULTURE, BLOOD (ROUTINE X 2)  URINE CULTURE  I-STAT CG4 LACTIC ACID, ED    Imaging Review Ct Abdomen Pelvis W Contrast  05/05/2014   CLINICAL DATA:  Right lower quadrant pain, fever, hypertension  EXAM: CT ABDOMEN AND PELVIS WITH CONTRAST  TECHNIQUE: Multidetector CT imaging of the abdomen and pelvis was performed using the standard protocol following bolus administration of intravenous contrast.  CONTRAST:  189mL OMNIPAQUE IOHEXOL 300 MG/ML  SOLN  COMPARISON:  CT 01/28/2014  FINDINGS: Dominant finding in the abdomen pelvis is a inflammatory process just ventral to the right common iliac artery graft repair. This inflammatory response measures approximately 5.0 x 3.6 cm (image 62, series 2. They appendix leads up to this inflammatory response stent is most likely certainly involved in this inflammatory infectious process. The proximal appendix appear normal. There is a small focus of and inflammation at this level on CTA of 01/28/2014.  There is small amount of  fluid along the right pericolic gutter extending along the right iliac vessels (image 71, series 2.  Lung bases are clear. No focal hepatic lesion. Patient status post cholecystectomy. The pancreas, spleen, adrenal glands, and kidneys are normal.  The stomach, small bowel, colon are normal. No free fluid the pelvis. Prostate gland and bladder normal. There is streak artifact generated from the prosthetic on the left. No aggressive osseous lesion.  IMPRESSION: Inflammatory/infectious process involving the tip of the appendix at the level of the aortic graft right common iliac branch. Difficult to tell if the graft is infected such as a mycotic aneurysm and secondary inflammation of the appendix or if the tip of the appendix is infected with secondary involvement of the graft. Recommend both vascular surgery and general surgery consultation in addressing this acute process. CTA of the abdomen may help define the aortic graft involvement.  Findings conveyed toDAN Lakelyn Straus on 05/05/2014  at18:50.   Electronically Signed   By: Suzy Bouchard M.D.   On: 05/05/2014 18:51   Dg Chest Port 1 View  05/05/2014   CLINICAL DATA:  FEVER HYPOTENSION. Shortness of breath and dizziness for 1 day. History of COPD, hypertension, emphysema, cancer.  EXAM: PORTABLE CHEST - 1 VIEW  COMPARISON:  01/28/2014 and 05/20/2013  FINDINGS: Heart size is normal. There is chronic change at the left lung base. There is pulmonary vascular congestion but no overt edema.  IMPRESSION: Stable appearance of the chest.   Electronically Signed   By: Shon Hale M.D.   On: 05/05/2014 15:50   Ct Cta Abd/pel W/cm &/or W/o Cm  05/05/2014   CLINICAL DATA:  Fever and hypertension. Concern for aortic graft infection.  EXAM: CTA ABDOMEN AND PELVIS wITHOUT AND WITH CONTRAST  TECHNIQUE: Multidetector CT imaging of the abdomen and pelvis was performed using the standard protocol during bolus administration of intravenous contrast. Multiplanar reconstructed images  and MIPs were obtained and reviewed to evaluate the vascular anatomy.  CONTRAST:  126mL OMNIPAQUE IOHEXOL 350 MG/ML SOLN  COMPARISON:  05/05/2014  FINDINGS: BODY WALL: Remote fat necrosis in the left lower anterior  chest wall.  LOWER CHEST: Coarse reticular opacities in the lower lungs.  There is a moderate size hiatal hernia.  ABDOMEN/PELVIS:  Liver: No focal abnormality.  Biliary: Cholecystectomy.  Pancreas: Unremarkable.  Spleen: Unremarkable.  Adrenals: Calcification within the left adrenal gland, usually post infectious or posttraumatic.  Kidneys and ureters: Bilateral renal cortical scarring. No hydronephrosis. The right ureter passes mainly inferior to the retroperitoneal inflammatory process.  Bladder: Unremarkable.  Reproductive: Unremarkable.  Bowel: No bowel obstruction or perforation. Appendiceal and small bowel findings below.  Retroperitoneum: Dystrophic calcifications in the left upper quadrant.  Peritoneum: No ascites or pneumoperitoneum.  Vascular: Status post aorto bi-iliac grafting. There is thick fat stranding and heterogeneous density, likely with enhancement, around right common iliac artery graft. The thickened tip of the appendix is indistinguishable from the inflammation, as is a loop of small bowel. There is no contrast extravasation or pseudoaneurysm. Inflammatory changes dissect along the retroperitoneum of the right pelvis. The graft is widely patent. Circumferential thickening at the level of the lower abdominal aorta which is stable from previous, reportedly this is a combination of native and grafted aorta. No perigraft gas. No arterial contrast within the bowel wall when accounting for oral enteric contrast. With the benefit of current imaging, early inflammatory changes present 01/28/2014. The infection could be related to primary graft infection or a neighboring small bowel diverticulitis. These findings argue against obstructive appendicitis given the temporality.  OSSEOUS: No  acute abnormalities.  Review of the MIP images confirms the above findings.  IMPRESSION: Aorto bi-iliac grafting with infection of the right iliac limb. In light of previous imaging, inflammation of the appendiceal tip is considered secondary, as discussed above. There is phlegmon around the graft but no abscess. Phlegmon involves the small bowel, but no evidence of fistula.   Electronically Signed   By: Jorje Guild M.D.   On: 05/05/2014 21:45     EKG Interpretation None      MDM   Final diagnoses:  Infected aortic graft, initial encounter  Sepsis, due to unspecified organism    63 yo M with a chief complaint of fever and right lower quadrant pain. Soft blood pressures on arrival improved with 4 L of fluid. Lactate normal. CT scan of the abdomen with contrast concerning for distal appendix infection versus aortic graft site infection. Evaluated by surgery and vascular surgery recommend CTA of the abdomen and pelvis. CTA of abdomen pelvis concerning for her aortic graft infection. Dr. Bridgett Larsson recommending IV antibiotics and internal medicine admission. Feel that this would be a poor surgical candidate.    Deno Etienne, MD 05/05/14 2352

## 2014-05-05 NOTE — Consult Note (Signed)
Referred by:  [facility]  Reason for referral: possible infected aortic repair  History of Present Illness  Alfred Carney is a 63 y.o. 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 Scot Dock & Dr. Amedeo Plenty who presents with chief complaint: fever.  Patient passed out today and fell to the floor.  He was found to have a fever up to 88 F reportedly by the family.  EMS was called to transport him to the ED.  He notes only left lateral flank pain which has be present since his TAAA repair.  The patient denies any embolic sx or or night sweats.  He is unaware of any episodes suggestive of bacteremia.    Past Medical History  Diagnosis Date  . Morbid obesity   . Chronic obstructive pulmonary disease 2008    Moderate  . Community acquired pneumonia   . Hypertension   . Hyperlipidemia   . Degenerative joint disease   . Coronary artery disease   . Gastroesophageal reflux disease   . 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 Hypokalemia     diuretic induced, resolved  . Shortness of breath   . Gout   . Colon polyps   . H/O hiatal hernia   . Enlarged liver     fatty liver by '09 CT  . Sleep apnea     not currently using CPAP 05/20/13  . Emphysema lung   . Cancer     skin cancer   Past Surgical History  Procedure Laterality Date  . Tonsillectomy    . Cholecystectomy    . Hip surgery      Status post left hip surgery with bone grafting  . Cardiac catheterization  2007    Ejection fraction is estimated at 60%  . Thoracoabdominal aortic aneurysm repair      with right femoral and left iliac BPG and reimplantation of renal arteries.  Marland Kitchen Hardware removal Left 05/26/2013    Procedure: HARDWARE REMOVAL;  Surgeon: Newt Minion, MD;  Location: [place];  Service: Orthopedics;  Laterality: Left;  Left Total Hip Arthroplasty, Removal of Deep Hardware  . Total hip  arthroplasty Left 05/26/2013    Procedure: TOTAL HIP ARTHROPLASTY;  Surgeon: Newt Minion, MD;  Location: [place];  Service: Orthopedics;  Laterality: Left;  Left Total Hip Arthroplasty, Removal Deep Hardware    History   Social History  . Marital Status: Married    Spouse Name: N/A    Number of Children: 4  . Years of Education: N/A   Occupational History  . architectural drawing    Social History Main Topics  . Smoking status: Former Research scientist (life sciences)  . Smokeless tobacco: Never Used  . Alcohol Use: No  . Drug Use: No  . Sexual Activity: Not on file   Other Topics Concern  . Not on file   Social History Narrative  . No narrative on file    Family History  Problem Relation Age of Onset  . Osteoarthritis Mother   . Diabetes Mother   . Heart disease Father     Coronary Artery Disease  . Multiple sclerosis Sister   . Factor V Leiden deficiency Sister     No current facility-administered medications on file prior to encounter.   Current Outpatient Prescriptions on File Prior to Encounter  Medication Sig Dispense  Refill  . hydrochlorothiazide (HYDRODIURIL) 25 MG tablet Take 25 mg by mouth daily.      Marland Kitchen ibuprofen (ADVIL,MOTRIN) 200 MG tablet Take 200-400 mg by mouth every 6 (six) hours as needed for pain.      Marland Kitchen lisinopril (PRINIVIL,ZESTRIL) 5 MG tablet Take 5 mg by mouth daily.      . metoprolol succinate (TOPROL-XL) 25 MG 24 hr tablet Take 25 mg by mouth daily.      . nitroGLYCERIN (NITROSTAT) 0.4 MG SL tablet Place 1 tablet (0.4 mg total) under the tongue every 5 (five) minutes x 3 doses as needed for chest pain.  25 tablet  4  . omeprazole (PRILOSEC) 20 MG capsule Take 20 mg by mouth daily.      . rosuvastatin (CRESTOR) 5 MG tablet Take 1 tablet (5 mg total) by mouth daily at 6 PM.  30 tablet  6    No Known Allergies  REVIEW OF SYSTEMS:  (Positives checked otherwise negative)  CARDIOVASCULAR:  []  chest pain, []  chest pressure, []  palpitations, []  shortness of breath when  laying flat, []  shortness of breath with exertion,  []  pain in feet when walking, []  pain in feet when laying flat, []  history of blood clot in veins (DVT), []  history of phlebitis, []  swelling in legs, []  varicose veins  PULMONARY:  []  productive cough, []  asthma, []  wheezing, [x]  severe COPD  NEUROLOGIC:  []  weakness in arms or legs, []  numbness in arms or legs, []  difficulty speaking or slurred speech, []  temporary loss of vision in one eye, []  dizziness  HEMATOLOGIC:  []  bleeding problems, []  problems with blood clotting too easily  MUSCULOSKEL:  []  joint pain, []  joint swelling  GASTROINTEST:  []  vomiting blood, []  blood in stool, [x]  chronic left flank pain  GENITOURINARY:  []  burning with urination, []  blood in urine  PSYCHIATRIC:  []  history of major depression  INTEGUMENTARY:  []  rashes, []  ulcers  CONSTITUTIONAL:  [x]  fever, [x]  chills  Physical Examination  Filed Vitals:   05/05/14 1915 05/05/14 1945 05/05/14 2015 05/05/14 2030  BP: 122/69 128/67 115/62 136/68  Pulse: 68 71 68 66  Temp:      TempSrc:      Resp: 21 19 17 15   Height:      Weight:      SpO2: 97% 91% 92% 95%    Body mass index is 43.05 kg/(m^2).  General: A&O x 3, WD, morbidly obese  Head: [place]/AT  Ear/Nose/Throat: Hearing grossly intact, nares w/o erythema or drainage, oropharynx w/o Erythema/Exudate, Mallampati score: 3  Eyes: PERRLA, EOMI  Neck: Supple, no nuchal rigidity, no palpable LAD  Pulmonary: Sym exp, barrel chest, faint BS, no rales, rhonchi, & wheezing, thoracoabdominal incision extending into chest  Cardiac: RRR, Nl S1, S2, no Murmurs, rubs or gallops  Vascular: Vessel Right Left  Radial Palpable Palpable  Brachial Palpable Palpable  Carotid Palpable, without bruit Palpable, without bruit  Aorta Not palpable N/A  Femoral Palpable Palpable  Popliteal Palpable Palpable  PT Palpable Not Palpable  DP Palpable Faintly Palpable   Gastrointestinal: soft, NTND, -G/R, - HSM, -  masses, - CVAT B, mild LUQ TTP, no point TTP, no RLQ TTP, mild L flank TTP, healed thoracoabdominal incision  Musculoskeletal: M/S 5/5 throughout , Extremities without ischemic changes   Neurologic: CN 2-12 intact , Pain and light touch intact in extremities , Motor exam as listed above  Psychiatric: Judgment intact, Mood & affect appropriate for pt's clinical situation  Dermatologic: See M/S exam for extremity exam, no rashes otherwise noted  Lymph : No Cervical, Axillary, or Inguinal lymphadenopathy   Laboratory: CBC:    Component Value Date/Time   WBC 18.1* 05/05/2014 1503   RBC 4.95 05/05/2014 1503   HGB 13.6 05/05/2014 1503   HCT 40.4 05/05/2014 1503   PLT 218 05/05/2014 1503   MCV 81.6 05/05/2014 1503   MCH 27.5 05/05/2014 1503   MCHC 33.7 05/05/2014 1503   RDW 15.4 05/05/2014 1503   LYMPHSABS 1.3 05/05/2014 1503   MONOABS 1.4* 05/05/2014 1503   EOSABS 0.0 05/05/2014 1503   BASOSABS 0.0 05/05/2014 1503    BMP:    Component Value Date/Time   NA 143 05/05/2014 1503   K 3.3* 05/05/2014 1503   CL 103 05/05/2014 1503   CO2 23 05/05/2014 1503   GLUCOSE 156* 05/05/2014 1503   BUN 19 05/05/2014 1503   CREATININE 1.27 05/05/2014 1503   CALCIUM 9.0 05/05/2014 1503   GFRNONAA 58* 05/05/2014 1503   GFRAA 67* 05/05/2014 1503    Coagulation: Lab Results  Component Value Date   INR 1.07 01/28/2014   INR 1.00 05/20/2013   No results found for this basename: PTT   Hepatic Function Panel     Component Value Date/Time   PROT 6.7 05/05/2014 1503   ALBUMIN 3.1* 05/05/2014 1503   AST 16 05/05/2014 1503   ALT 13 05/05/2014 1503   ALKPHOS 68 05/05/2014 1503   BILITOT 0.6 05/05/2014 1503   BILIDIR 0.1 01/14/2012 0842   Lipid Panel     Component Value Date/Time   CHOL 88 01/29/2014 0215   TRIG 130 01/29/2014 0215   HDL 30* 01/29/2014 0215   CHOLHDL 2.9 01/29/2014 0215   VLDL 26 01/29/2014 0215   LDLCALC 32 01/29/2014 0215   Radiology: Ct Abdomen Pelvis W Contrast  05/05/2014   CLINICAL DATA:  Right  lower quadrant pain, fever, hypertension  EXAM: CT ABDOMEN AND PELVIS WITH CONTRAST  TECHNIQUE: Multidetector CT imaging of the abdomen and pelvis was performed using the standard protocol following bolus administration of intravenous contrast.  CONTRAST:  161mL OMNIPAQUE IOHEXOL 300 MG/ML  SOLN  COMPARISON:  CT 01/28/2014  FINDINGS: Dominant finding in the abdomen pelvis is a inflammatory process just ventral to the right common iliac artery graft repair. This inflammatory response measures approximately 5.0 x 3.6 cm (image 62, series 2. They appendix leads up to this inflammatory response stent is most likely certainly involved in this inflammatory infectious process. The proximal appendix appear normal. There is a small focus of and inflammation at this level on CTA of 01/28/2014.  There is small amount of fluid along the right pericolic gutter extending along the right iliac vessels (image 71, series 2.  Lung bases are clear. No focal hepatic lesion. Patient status post cholecystectomy. The pancreas, spleen, adrenal glands, and kidneys are normal.  The stomach, small bowel, colon are normal. No free fluid the pelvis. Prostate gland and bladder normal. There is streak artifact generated from the prosthetic on the left. No aggressive osseous lesion.  IMPRESSION: Inflammatory/infectious process involving the tip of the appendix at the level of the aortic graft right common iliac branch. Difficult to tell if the graft is infected such as a mycotic aneurysm and secondary inflammation of the appendix or if the tip of the appendix is infected with secondary involvement of the graft. Recommend both vascular surgery and general surgery consultation in addressing this acute process. CTA of the abdomen may help  define the aortic graft involvement.  Findings conveyed toDAN FLOYD on 05/05/2014  at18:50.   Electronically Signed   By: Suzy Bouchard M.D.   On: 05/05/2014 18:51   Dg Chest Port 1 View  05/05/2014   CLINICAL  DATA:  FEVER HYPOTENSION. Shortness of breath and dizziness for 1 day. History of COPD, hypertension, emphysema, cancer.  EXAM: PORTABLE CHEST - 1 VIEW  COMPARISON:  01/28/2014 and 05/20/2013  FINDINGS: Heart size is normal. There is chronic change at the left lung base. There is pulmonary vascular congestion but no overt edema.  IMPRESSION: Stable appearance of the chest.   Electronically Signed   By: Shon Hale M.D.   On: 05/05/2014 15:50     Outside Studies/Documentation 10 pages of outside documents were reviewed including: clinic charts detailing TAAA workup and operative reports.  Medical Decision Making  Yony Roulston is a 63 y.o. male s/p Type IV TAAA repair who presents with: fever of unknown origin.   Based on exam and CT, no immediate exploratory laparotomy is indicated.  I suspect the appendix is secondarily involved with the inflammatory vs infectious process in RLQ.  The timing of the CT abd/pelvis is inadequate to evaluate the aortic graft.  CTA will be needed.  Discussed with ED physician.  Patient will need medical optimization for his multiple high grade medical problems including:  severe COPD and CAD.  I would initially admit him to an Int. Med service for such and IV antibiotics.  I suspect 11 years after his prior aortic report, his current health is substantially worsen than previously.  I doubt he could survive such a Type IV TAAA repair at this time.   I have reviewed this patient's previous operative report: bilateral renal arteries are sewn to the graft, and the graft is tapered posteriorly to the level of the celiac arteries.    In short, if the aortic graft is infected, this patient's mesenteric arteries are involved along with his renal arteries.  Obvious this is a highly mortal problem if he turns out to have such.  Patient will need: blood cultures x 2, broad spectrum antibiotics and possible infectious disease input.  WBC tagged scan might provide some  additional information.  I expect the right RLQ to light up but if the main body of the aortic graft also enhances, this supports the worst case scenario.  Our service will continue to follow this patient along with you.  Dr. Scot Dock will be by in the morning.  Thank you for allowing [name] to participate in this patient's care.  Adele Barthel, MD [facility] Office: [phone] Pager: [phone]  05/05/2014, 8:58 PM

## 2014-05-05 NOTE — ED Notes (Signed)
Report to [facility] on 3S.  Pt to go to floor with RN accompanying.

## 2014-05-06 ENCOUNTER — Encounter (HOSPITAL_COMMUNITY): Payer: Self-pay | Admitting: *Deleted

## 2014-05-06 DIAGNOSIS — I10 Essential (primary) hypertension: Secondary | ICD-10-CM

## 2014-05-06 DIAGNOSIS — Y849 Medical procedure, unspecified as the cause of abnormal reaction of the patient, or of later complication, without mention of misadventure at the time of the procedure: Secondary | ICD-10-CM

## 2014-05-06 DIAGNOSIS — A419 Sepsis, unspecified organism: Secondary | ICD-10-CM

## 2014-05-06 DIAGNOSIS — J449 Chronic obstructive pulmonary disease, unspecified: Secondary | ICD-10-CM

## 2014-05-06 DIAGNOSIS — T827XXA Infection and inflammatory reaction due to other cardiac and vascular devices, implants and grafts, initial encounter: Principal | ICD-10-CM

## 2014-05-06 LAB — CBC WITH DIFFERENTIAL/PLATELET
Basophils Absolute: 0 10*3/uL (ref 0.0–0.1)
Basophils Relative: 0 % (ref 0–1)
Eosinophils Absolute: 0 10*3/uL (ref 0.0–0.7)
Eosinophils Relative: 0 % (ref 0–5)
HCT: 36.6 % — ABNORMAL LOW (ref 39.0–52.0)
Hemoglobin: 12.1 g/dL — ABNORMAL LOW (ref 13.0–17.0)
Lymphocytes Relative: 12 % (ref 12–46)
Lymphs Abs: 1.4 10*3/uL (ref 0.7–4.0)
MCH: 27 pg (ref 26.0–34.0)
MCHC: 33.1 g/dL (ref 30.0–36.0)
MCV: 81.7 fL (ref 78.0–100.0)
Monocytes Absolute: 0.9 10*3/uL (ref 0.1–1.0)
Monocytes Relative: 8 % (ref 3–12)
Neutro Abs: 9.8 10*3/uL — ABNORMAL HIGH (ref 1.7–7.7)
Neutrophils Relative %: 80 % — ABNORMAL HIGH (ref 43–77)
Platelets: 196 10*3/uL (ref 150–400)
RBC: 4.48 MIL/uL (ref 4.22–5.81)
RDW: 15.6 % — ABNORMAL HIGH (ref 11.5–15.5)
WBC: 12.1 10*3/uL — ABNORMAL HIGH (ref 4.0–10.5)

## 2014-05-06 LAB — GLUCOSE, CAPILLARY
Glucose-Capillary: 107 mg/dL — ABNORMAL HIGH (ref 70–99)
Glucose-Capillary: 132 mg/dL — ABNORMAL HIGH (ref 70–99)
Glucose-Capillary: 145 mg/dL — ABNORMAL HIGH (ref 70–99)
Glucose-Capillary: 145 mg/dL — ABNORMAL HIGH (ref 70–99)
Glucose-Capillary: 177 mg/dL — ABNORMAL HIGH (ref 70–99)
Glucose-Capillary: 89 mg/dL (ref 70–99)

## 2014-05-06 LAB — COMPREHENSIVE METABOLIC PANEL
ALT: 14 U/L (ref 0–53)
AST: 21 U/L (ref 0–37)
Albumin: 2.7 g/dL — ABNORMAL LOW (ref 3.5–5.2)
Alkaline Phosphatase: 65 U/L (ref 39–117)
Anion gap: 13 (ref 5–15)
BUN: 16 mg/dL (ref 6–23)
CO2: 23 mEq/L (ref 19–32)
Calcium: 8.2 mg/dL — ABNORMAL LOW (ref 8.4–10.5)
Chloride: 105 mEq/L (ref 96–112)
Creatinine, Ser: 1.14 mg/dL (ref 0.50–1.35)
GFR calc Af Amer: 77 mL/min — ABNORMAL LOW (ref >90)
GFR calc non Af Amer: 66 mL/min — ABNORMAL LOW (ref >90)
Glucose, Bld: 173 mg/dL — ABNORMAL HIGH (ref 70–99)
Potassium: 3.2 mEq/L — ABNORMAL LOW (ref 3.7–5.3)
Sodium: 141 mEq/L (ref 137–147)
Total Bilirubin: 0.8 mg/dL (ref 0.3–1.2)
Total Protein: 6.1 g/dL (ref 6.0–8.3)

## 2014-05-06 LAB — URINE CULTURE
Colony Count: NO GROWTH
Culture: NO GROWTH

## 2014-05-06 LAB — MRSA PCR SCREENING: MRSA by PCR: NEGATIVE

## 2014-05-06 LAB — TROPONIN I: Troponin I: <0.3 ng/mL (ref <0.30)

## 2014-05-06 MED ORDER — ALBUTEROL SULFATE (2.5 MG/3ML) 0.083% IN NEBU: 2.5000 mg | INHALATION_SOLUTION | RESPIRATORY_TRACT | Status: DC | PRN

## 2014-05-06 MED ORDER — MORPHINE SULFATE 2 MG/ML IJ SOLN: 1.0000 mg | INTRAMUSCULAR | Status: DC | PRN

## 2014-05-06 MED ORDER — ONDANSETRON HCL 4 MG PO TABS: 4.0000 mg | ORAL_TABLET | Freq: Four times a day (QID) | ORAL | Status: DC | PRN

## 2014-05-06 MED ORDER — SODIUM CHLORIDE 0.9 % IV SOLN
INTRAVENOUS | Status: AC
  Administered 2014-05-06 (×2): via INTRAVENOUS

## 2014-05-06 MED ORDER — ACETAMINOPHEN 325 MG PO TABS
650.0000 mg | ORAL_TABLET | Freq: Four times a day (QID) | ORAL | Status: DC | PRN
  Administered 2014-05-06 – 2014-05-09 (×8): 650 mg via ORAL
  Filled 2014-05-06 (×8): qty 2

## 2014-05-06 MED ORDER — VANCOMYCIN HCL 10 G IV SOLR
2000.0000 mg | Freq: Once | INTRAVENOUS | Status: AC
  Administered 2014-05-06: 2000 mg via INTRAVENOUS
  Filled 2014-05-06: qty 2000

## 2014-05-06 MED ORDER — ASPIRIN EC 325 MG PO TBEC
325.0000 mg | DELAYED_RELEASE_TABLET | Freq: Every day | ORAL | Status: DC
  Administered 2014-05-06 – 2014-05-10 (×5): 325 mg via ORAL
  Filled 2014-05-06 (×5): qty 1

## 2014-05-06 MED ORDER — VANCOMYCIN HCL 10 G IV SOLR
1250.0000 mg | Freq: Two times a day (BID) | INTRAVENOUS | Status: DC
  Administered 2014-05-06 – 2014-05-10 (×9): 1250 mg via INTRAVENOUS
  Filled 2014-05-06 (×10): qty 1250

## 2014-05-06 MED ORDER — ONDANSETRON HCL 4 MG/2ML IJ SOLN
4.0000 mg | Freq: Four times a day (QID) | INTRAMUSCULAR | Status: DC | PRN
Start: 2014-05-06 — End: 2014-05-10

## 2014-05-06 MED ORDER — POTASSIUM CHLORIDE CRYS ER 20 MEQ PO TBCR
40.0000 meq | EXTENDED_RELEASE_TABLET | Freq: Once | ORAL | Status: AC
  Administered 2014-05-06: 40 meq via ORAL
  Filled 2014-05-06: qty 2

## 2014-05-06 MED ORDER — PIPERACILLIN-TAZOBACTAM 3.375 G IVPB
3.3750 g | Freq: Three times a day (TID) | INTRAVENOUS | Status: DC
  Administered 2014-05-06 – 2014-05-09 (×10): 3.375 g via INTRAVENOUS
  Filled 2014-05-06 (×13): qty 50

## 2014-05-06 MED ORDER — ACETAMINOPHEN 650 MG RE SUPP: 650.0000 mg | Freq: Four times a day (QID) | RECTAL | Status: DC | PRN

## 2014-05-06 MED ORDER — HEPARIN SODIUM (PORCINE) 5000 UNIT/ML IJ SOLN
5000.0000 [IU] | Freq: Three times a day (TID) | INTRAMUSCULAR | Status: DC
  Administered 2014-05-06 – 2014-05-10 (×12): 5000 [IU] via SUBCUTANEOUS
  Filled 2014-05-06 (×18): qty 1

## 2014-05-06 NOTE — Progress Notes (Signed)
Imogene Burn. Georgette Dover, MD, [facility]  General/ Trauma Surgery  05/06/2014 10:22 AM

## 2014-05-06 NOTE — Progress Notes (Signed)
[facility] Progress Note     Subjective: Pt's pain about the same, but pain medications working.  WBC down.  Nausea on and off, but no vomiting.  Some flatus, but no BM since yesterday.  Not ambulated OOB, but wants to.    Objective: Vital signs in last 24 hours: Temp:  [98.2 F (36.8 C)-103.2 F (39.6 C)] 98.5 F (36.9 C) (08/21 0353) Pulse Rate:  [66-90] 71 (08/21 0353) Resp:  [8-24] 8 (08/21 0353) BP: (93-161)/(28-90) 141/60 mmHg (08/21 0353) SpO2:  [91 %-99 %] 94 % (08/21 0353) Weight:  [300 lb (136.079 kg)-303 lb 5.7 oz (137.6 kg)] 303 lb 5.7 oz (137.6 kg) (08/21 0006)    Intake/Output from previous day: 08/20 0701 - 08/21 0700 In: -  Out: 375 [Urine:375] Intake/Output this shift:    PE: Gen:  Alert, NAD, pleasant Abd: Largely obese, soft, mild distension, mild diffuse tenderness, +BS, no HSM, well healed abdominal scars noted   Lab Results:   Recent Labs  05/05/14 1503 05/06/14 0120  WBC 18.1* 12.1*  HGB 13.6 12.1*  HCT 40.4 36.6*  PLT 218 196   BMET  Recent Labs  05/05/14 1503 05/06/14 0120  NA 143 141  K 3.3* 3.2*  CL 103 105  CO2 23 23  GLUCOSE 156* 173*  BUN 19 16  CREATININE 1.27 1.14  CALCIUM 9.0 8.2*   PT/INR No results found for this basename: LABPROT, INR,  in the last 72 hours CMP     Component Value Date/Time   NA 141 05/06/2014 0120   K 3.2* 05/06/2014 0120   CL 105 05/06/2014 0120   CO2 23 05/06/2014 0120   GLUCOSE 173* 05/06/2014 0120   BUN 16 05/06/2014 0120   CREATININE 1.14 05/06/2014 0120   CALCIUM 8.2* 05/06/2014 0120   PROT 6.1 05/06/2014 0120   ALBUMIN 2.7* 05/06/2014 0120   AST 21 05/06/2014 0120   ALT 14 05/06/2014 0120   ALKPHOS 65 05/06/2014 0120   BILITOT 0.8 05/06/2014 0120   GFRNONAA 66* 05/06/2014 0120   GFRAA 77* 05/06/2014 0120   Lipase  No results found for this basename: lipase       Studies/Results: Ct Abdomen Pelvis W Contrast  05/05/2014   CLINICAL DATA:  Right lower quadrant pain, fever,  hypertension  EXAM: CT ABDOMEN AND PELVIS WITH CONTRAST  TECHNIQUE: Multidetector CT imaging of the abdomen and pelvis was performed using the standard protocol following bolus administration of intravenous contrast.  CONTRAST:  159mL OMNIPAQUE IOHEXOL 300 MG/ML  SOLN  COMPARISON:  CT 01/28/2014  FINDINGS: Dominant finding in the abdomen pelvis is a inflammatory process just ventral to the right common iliac artery graft repair. This inflammatory response measures approximately 5.0 x 3.6 cm (image 62, series 2. They appendix leads up to this inflammatory response stent is most likely certainly involved in this inflammatory infectious process. The proximal appendix appear normal. There is a small focus of and inflammation at this level on CTA of 01/28/2014.  There is small amount of fluid along the right pericolic gutter extending along the right iliac vessels (image 71, series 2.  Lung bases are clear. No focal hepatic lesion. Patient status post cholecystectomy. The pancreas, spleen, adrenal glands, and kidneys are normal.  The stomach, small bowel, colon are normal. No free fluid the pelvis. Prostate gland and bladder normal. There is streak artifact generated from the prosthetic on the left. No aggressive osseous lesion.  IMPRESSION: Inflammatory/infectious process involving the tip of the appendix  at the level of the aortic graft right common iliac branch. Difficult to tell if the graft is infected such as a mycotic aneurysm and secondary inflammation of the appendix or if the tip of the appendix is infected with secondary involvement of the graft. Recommend both vascular surgery and general surgery consultation in addressing this acute process. CTA of the abdomen may help define the aortic graft involvement.  Findings conveyed toDAN FLOYD on 05/05/2014  at18:50.   Electronically Signed   By: Suzy Bouchard M.D.   On: 05/05/2014 18:51   Dg Chest Port 1 View  05/05/2014   CLINICAL DATA:  FEVER HYPOTENSION.  Shortness of breath and dizziness for 1 day. History of COPD, hypertension, emphysema, cancer.  EXAM: PORTABLE CHEST - 1 VIEW  COMPARISON:  01/28/2014 and 05/20/2013  FINDINGS: Heart size is normal. There is chronic change at the left lung base. There is pulmonary vascular congestion but no overt edema.  IMPRESSION: Stable appearance of the chest.   Electronically Signed   By: Shon Hale M.D.   On: 05/05/2014 15:50   Ct Cta Abd/pel W/cm &/or W/o Cm  05/05/2014   CLINICAL DATA:  Fever and hypertension. Concern for aortic graft infection.  EXAM: CTA ABDOMEN AND PELVIS wITHOUT AND WITH CONTRAST  TECHNIQUE: Multidetector CT imaging of the abdomen and pelvis was performed using the standard protocol during bolus administration of intravenous contrast. Multiplanar reconstructed images and MIPs were obtained and reviewed to evaluate the vascular anatomy.  CONTRAST:  165mL OMNIPAQUE IOHEXOL 350 MG/ML SOLN  COMPARISON:  05/05/2014  FINDINGS: BODY WALL: Remote fat necrosis in the left lower anterior chest wall.  LOWER CHEST: Coarse reticular opacities in the lower lungs.  There is a moderate size hiatal hernia.  ABDOMEN/PELVIS:  Liver: No focal abnormality.  Biliary: Cholecystectomy.  Pancreas: Unremarkable.  Spleen: Unremarkable.  Adrenals: Calcification within the left adrenal gland, usually post infectious or posttraumatic.  Kidneys and ureters: Bilateral renal cortical scarring. No hydronephrosis. The right ureter passes mainly inferior to the retroperitoneal inflammatory process.  Bladder: Unremarkable.  Reproductive: Unremarkable.  Bowel: No bowel obstruction or perforation. Appendiceal and small bowel findings below.  Retroperitoneum: Dystrophic calcifications in the left upper quadrant.  Peritoneum: No ascites or pneumoperitoneum.  Vascular: Status post aorto bi-iliac grafting. There is thick fat stranding and heterogeneous density, likely with enhancement, around right common iliac artery graft. The thickened tip  of the appendix is indistinguishable from the inflammation, as is a loop of small bowel. There is no contrast extravasation or pseudoaneurysm. Inflammatory changes dissect along the retroperitoneum of the right pelvis. The graft is widely patent. Circumferential thickening at the level of the lower abdominal aorta which is stable from previous, reportedly this is a combination of native and grafted aorta. No perigraft gas. No arterial contrast within the bowel wall when accounting for oral enteric contrast. With the benefit of current imaging, early inflammatory changes present 01/28/2014. The infection could be related to primary graft infection or a neighboring small bowel diverticulitis. These findings argue against obstructive appendicitis given the temporality.  OSSEOUS: No acute abnormalities.  Review of the MIP images confirms the above findings.  IMPRESSION: Aorto bi-iliac grafting with infection of the right iliac limb. In light of previous imaging, inflammation of the appendiceal tip is considered secondary, as discussed above. There is phlegmon around the graft but no abscess. Phlegmon involves the small bowel, but no evidence of fistula.   Electronically Signed   By: Gilford Silvius.D.  On: 05/05/2014 21:45    Anti-infectives: Anti-infectives   Start     Dose/Rate Route Frequency Ordered Stop   05/06/14 1300  vancomycin (VANCOCIN) 1,250 mg in sodium chloride 0.9 % 250 mL IVPB     1,250 mg 166.7 mL/hr over 90 Minutes Intravenous Every 12 hours 05/06/14 0024     05/06/14 0400  piperacillin-tazobactam (ZOSYN) IVPB 3.375 g     3.375 g 12.5 mL/hr over 240 Minutes Intravenous 3 times per day 05/06/14 0024     05/06/14 0030  vancomycin (VANCOCIN) 2,000 mg in sodium chloride 0.9 % 500 mL IVPB     2,000 mg 250 mL/hr over 120 Minutes Intravenous  Once 05/06/14 0024 05/06/14 0259   05/05/14 1900  piperacillin-tazobactam (ZOSYN) IVPB 3.375 g     3.375 g 100 mL/hr over 30 Minutes Intravenous  Once  05/05/14 1856 05/05/14 1950       Assessment/Plan Inflammation of the right common iliac limb aortic graft Inflammation of the tip of the appendix without signs of appendicitis Leukocytosis - improved to 12.1  Plan: 1.  Dr. Grandville Silos discussed with Dr. Bridgett Larsson and recommends against appendectomy 2.  NPO, Continue IV antibiotics and medical management 3.  No further recommendations, no need for surgery from our perspective 4.  Will sign off, call with questions/concerns    LOS: 1 day    Coralie Keens 05/06/2014, 7:35 AM Pager: [phone]

## 2014-05-06 NOTE — Progress Notes (Addendum)
[facility] PROGRESS NOTE  Alfred Carney VHQ:[id number] DOB: [birth year] DOA: 05/05/2014 PCP: Helane Rima, MD  Assessment/Plan: 1. 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 Currently on vancomycin and Zosyn after blood cultures obtained. 2. Vascular surg recs noted. Medical management with two broad spec abx for now with no plans for surgery per Vascular. 3. Appendiceal inflammation noted, however felt to be secondary inflammation 4. General surgery has signed off 5. Consider ID consult for further recs 2. CAD 1. Denies any chest pain. 2. On aspirin.  3. Pt initially hypotensive. Blood pressures improved after bp meds held 3. Hyperlipidemia 1. Continue statins once patient can take by mouth. 4. COPD 1. Not wheezing at this time. 5. OSA 1. Patient reports not using CPAP at home.  Code Status: Full Family Communication: Pt in room (indicate person spoken with, relationship, and if by phone, the number) Disposition Plan: Pending improvement in symptoms   Consultants:  General Surgery  Vascular Surgery  Procedures:    Antibiotics:  Vancomycin 8/21>>>  Zosyn 8/21>>>   HPI/Subjective: No acute events noted. Pt states he feels slightly better this AM  Objective: Filed Vitals:   05/05/14 2345 05/06/14 0006 05/06/14 0353 05/06/14 0745  BP: 127/56 148/74 141/60 131/78  Pulse: 71 76 71 84  Temp:  99.1 F (37.3 C) 98.5 F (36.9 C) 99.5 F (37.5 C)  TempSrc:  Oral Oral Oral  Resp:   8 21  Height:  5\' 9"  (1.753 m)    Weight:  137.6 kg (303 lb 5.7 oz)    SpO2: 95% 94% 94% 95%    Intake/Output Summary (Last 24 hours) at 05/06/14 0936 Last data filed at 05/06/14 0749  Gross per 24 hour  Intake      0 ml  Output    575 ml  Net   -575 ml   Filed Weights   05/05/14 1453 05/06/14 0006  Weight: 136.079 kg (300 lb) 137.6 kg (303 lb 5.7 oz)    Exam:   General:  Awake, diaphoretic, in nad  Cardiovascular: regular, s1,  s2  Respiratory: normal resp effort, no wheezing  Abdomen: soft, obese, nondistended  Musculoskeletal: perfused, no clubbing   Data Reviewed: Basic Metabolic Panel:  Recent Labs Lab 05/05/14 1503 05/06/14 0120  NA 143 141  K 3.3* 3.2*  CL 103 105  CO2 23 23  GLUCOSE 156* 173*  BUN 19 16  CREATININE 1.27 1.14  CALCIUM 9.0 8.2*   Liver Function Tests:  Recent Labs Lab 05/05/14 1503 05/06/14 0120  AST 16 21  ALT 13 14  ALKPHOS 68 65  BILITOT 0.6 0.8  PROT 6.7 6.1  ALBUMIN 3.1* 2.7*   No results found for this basename: LIPASE, AMYLASE,  in the last 168 hours No results found for this basename: AMMONIA,  in the last 168 hours CBC:  Recent Labs Lab 05/05/14 1503 05/06/14 0120  WBC 18.1* 12.1*  NEUTROABS 15.4* 9.8*  HGB 13.6 12.1*  HCT 40.4 36.6*  MCV 81.6 81.7  PLT 218 196   Cardiac Enzymes:  Recent Labs Lab 05/06/14 0120  TROPONINI <0.30   BNP (last 3 results)  Recent Labs  01/28/14 1225  PROBNP 59.1   CBG:  Recent Labs Lab 05/06/14 0027 05/06/14 0355 05/06/14 0920  GLUCAP 177* 89 132*    Recent Results (from the past 240 hour(s))  CULTURE, BLOOD (ROUTINE X 2)     Status: None   Collection Time    05/05/14  3:40 PM      Result Value Ref Range Status   Specimen Description BLOOD HAND RIGHT   Final   Special Requests BOTTLES DRAWN AEROBIC AND ANAEROBIC 5CC   Final   Culture  Setup Time     Final   Value: 05/05/2014 22:39     Performed at [facility]   Culture     Final   Value:        BLOOD CULTURE RECEIVED NO GROWTH TO DATE CULTURE WILL BE HELD FOR 5 DAYS BEFORE ISSUING A FINAL NEGATIVE REPORT     Performed at [facility]   Report Status PENDING   Incomplete  CULTURE, BLOOD (ROUTINE X 2)     Status: None   Collection Time    05/05/14  4:30 PM      Result Value Ref Range Status   Specimen Description BLOOD LEFT HAND   Final   Special Requests BOTTLES DRAWN AEROBIC AND ANAEROBIC 10CC   Final   Culture  Setup  Time     Final   Value: 05/05/2014 22:40     Performed at [facility]   Culture     Final   Value:        BLOOD CULTURE RECEIVED NO GROWTH TO DATE CULTURE WILL BE HELD FOR 5 DAYS BEFORE ISSUING A FINAL NEGATIVE REPORT     Performed at [facility]   Report Status PENDING   Incomplete  MRSA PCR SCREENING     Status: None   Collection Time    05/06/14 12:47 AM      Result Value Ref Range Status   MRSA by PCR NEGATIVE  NEGATIVE Final   Comment:            The GeneXpert MRSA Assay (FDA     approved for NASAL specimens     only), is one component of a     comprehensive MRSA colonization     surveillance program. It is not     intended to diagnose MRSA     infection nor to guide or     monitor treatment for     MRSA infections.     Studies: Ct Abdomen Pelvis W Contrast  05/05/2014   CLINICAL DATA:  Right lower quadrant pain, fever, hypertension  EXAM: CT ABDOMEN AND PELVIS WITH CONTRAST  TECHNIQUE: Multidetector CT imaging of the abdomen and pelvis was performed using the standard protocol following bolus administration of intravenous contrast.  CONTRAST:  193mL OMNIPAQUE IOHEXOL 300 MG/ML  SOLN  COMPARISON:  CT 01/28/2014  FINDINGS: Dominant finding in the abdomen pelvis is a inflammatory process just ventral to the right common iliac artery graft repair. This inflammatory response measures approximately 5.0 x 3.6 cm (image 62, series 2. They appendix leads up to this inflammatory response stent is most likely certainly involved in this inflammatory infectious process. The proximal appendix appear normal. There is a small focus of and inflammation at this level on CTA of 01/28/2014.  There is small amount of fluid along the right pericolic gutter extending along the right iliac vessels (image 71, series 2.  Lung bases are clear. No focal hepatic lesion. Patient status post cholecystectomy. The pancreas, spleen, adrenal glands, and kidneys are normal.  The stomach, small bowel,  colon are normal. No free fluid the pelvis. Prostate gland and bladder normal. There is streak artifact generated from the prosthetic on the left. No aggressive osseous lesion.  IMPRESSION: Inflammatory/infectious process involving the  tip of the appendix at the level of the aortic graft right common iliac branch. Difficult to tell if the graft is infected such as a mycotic aneurysm and secondary inflammation of the appendix or if the tip of the appendix is infected with secondary involvement of the graft. Recommend both vascular surgery and general surgery consultation in addressing this acute process. CTA of the abdomen may help define the aortic graft involvement.  Findings conveyed toDAN FLOYD on 05/05/2014  at18:50.   Electronically Signed   By: Suzy Bouchard M.D.   On: 05/05/2014 18:51   Dg Chest Port 1 View  05/05/2014   CLINICAL DATA:  FEVER HYPOTENSION. Shortness of breath and dizziness for 1 day. History of COPD, hypertension, emphysema, cancer.  EXAM: PORTABLE CHEST - 1 VIEW  COMPARISON:  01/28/2014 and 05/20/2013  FINDINGS: Heart size is normal. There is chronic change at the left lung base. There is pulmonary vascular congestion but no overt edema.  IMPRESSION: Stable appearance of the chest.   Electronically Signed   By: Shon Hale M.D.   On: 05/05/2014 15:50   Ct Cta Abd/pel W/cm &/or W/o Cm  05/05/2014   CLINICAL DATA:  Fever and hypertension. Concern for aortic graft infection.  EXAM: CTA ABDOMEN AND PELVIS wITHOUT AND WITH CONTRAST  TECHNIQUE: Multidetector CT imaging of the abdomen and pelvis was performed using the standard protocol during bolus administration of intravenous contrast. Multiplanar reconstructed images and MIPs were obtained and reviewed to evaluate the vascular anatomy.  CONTRAST:  181mL OMNIPAQUE IOHEXOL 350 MG/ML SOLN  COMPARISON:  05/05/2014  FINDINGS: BODY WALL: Remote fat necrosis in the left lower anterior chest wall.  LOWER CHEST: Coarse reticular opacities in the  lower lungs.  There is a moderate size hiatal hernia.  ABDOMEN/PELVIS:  Liver: No focal abnormality.  Biliary: Cholecystectomy.  Pancreas: Unremarkable.  Spleen: Unremarkable.  Adrenals: Calcification within the left adrenal gland, usually post infectious or posttraumatic.  Kidneys and ureters: Bilateral renal cortical scarring. No hydronephrosis. The right ureter passes mainly inferior to the retroperitoneal inflammatory process.  Bladder: Unremarkable.  Reproductive: Unremarkable.  Bowel: No bowel obstruction or perforation. Appendiceal and small bowel findings below.  Retroperitoneum: Dystrophic calcifications in the left upper quadrant.  Peritoneum: No ascites or pneumoperitoneum.  Vascular: Status post aorto bi-iliac grafting. There is thick fat stranding and heterogeneous density, likely with enhancement, around right common iliac artery graft. The thickened tip of the appendix is indistinguishable from the inflammation, as is a loop of small bowel. There is no contrast extravasation or pseudoaneurysm. Inflammatory changes dissect along the retroperitoneum of the right pelvis. The graft is widely patent. Circumferential thickening at the level of the lower abdominal aorta which is stable from previous, reportedly this is a combination of native and grafted aorta. No perigraft gas. No arterial contrast within the bowel wall when accounting for oral enteric contrast. With the benefit of current imaging, early inflammatory changes present 01/28/2014. The infection could be related to primary graft infection or a neighboring small bowel diverticulitis. These findings argue against obstructive appendicitis given the temporality.  OSSEOUS: No acute abnormalities.  Review of the MIP images confirms the above findings.  IMPRESSION: Aorto bi-iliac grafting with infection of the right iliac limb. In light of previous imaging, inflammation of the appendiceal tip is considered secondary, as discussed above. There is  phlegmon around the graft but no abscess. Phlegmon involves the small bowel, but no evidence of fistula.   Electronically Signed   By: Roderic Palau  Watts M.D.   On: 05/05/2014 21:45    Scheduled Meds: . aspirin EC  325 mg Oral Daily  . heparin  5,000 Units Subcutaneous 3 times per day  . piperacillin-tazobactam (ZOSYN)  IV  3.375 g Intravenous 3 times per day  . vancomycin  1,250 mg Intravenous Q12H   Continuous Infusions: . sodium chloride 125 mL/hr at 05/06/14 0600    Principal Problem:   Sepsis Active Problems:   COPD   CAD (coronary artery disease) stable non obstructive CAD   PAD (peripheral artery disease)   Infected aortic graft  Time spent: 65min  Cadie Sorci, [facility] Pager [phone]. If 7PM-7AM, please contact night-coverage at [email or web address], password [facility] 05/06/2014, 9:36 AM  LOS: 1 day

## 2014-05-06 NOTE — ED Provider Notes (Signed)
I saw and evaluated the patient, reviewed the resident's note and I agree with the findings and plan.   EKG Interpretation   Date/Time:  Thursday May 05 2014 17:13:45 EDT Ventricular Rate:  72 PR Interval:  140 QRS Duration: 105 QT Interval:  484 QTC Calculation: 530 R Axis:   -20 Text Interpretation:  Sinus rhythm Borderline left axis deviation Low  voltage, precordial leads Prolonged QT interval Baseline wander in lead(s)  II No significant change was found Confirmed by Daijha Leggio  MD, Callahan Wild ([id number])  on 05/06/2014 12:34:19 AM      Patient with what appears to be aortic graft infection with phlegmon.  No clear abscess.  Vascular surgery consultation.  Gen. surgery consultation.  This is not thought to represent acute appendicitis at this time.  Patient was given broad-spectrum antibiotics.  He'll be admitted to the hospitalist.  Vascular surgery will continue to follow.  Filed Vitals:   05/06/14 0006  BP: 148/74  Pulse: 76  Temp: 99.1 F (37.3 C)  Resp:      Hoy Morn, MD 05/06/14 [phone]

## 2014-05-06 NOTE — Progress Notes (Signed)
ANTIBIOTIC CONSULT NOTE - INITIAL  Pharmacy Consult for Vancomycin/Zosyn  Indication: rule out sepsis, possible infected aortic repair  No Known Allergies  Patient Measurements: Height: 5\' 9"  (175.3 cm) Weight: 303 lb 5.7 oz (137.6 kg) IBW/kg (Calculated) : 70.7 Vital Signs: Temp: 99.1 F (37.3 C) (08/21 0006) Temp src: Oral (08/21 0006) BP: 148/74 mmHg (08/21 0006) Pulse Rate: 76 (08/21 0006)  Labs:  Recent Labs  05/05/14 1503  WBC 18.1*  HGB 13.6  PLT 218  CREATININE 1.27   Estimated Creatinine Clearance: 81 ml/min (by C-G formula based on Cr of 1.27).  Medical History: Past Medical History  Diagnosis Date  . Morbid obesity   . Chronic obstructive pulmonary disease 2008    Moderate  . Community acquired pneumonia   . Hypertension   . Hyperlipidemia   . Degenerative joint disease   . Coronary artery disease   . Gastroesophageal reflux disease   . 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 Hypokalemia     diuretic induced, resolved  . Shortness of breath   . Gout   . Colon polyps   . H/O hiatal hernia   . Enlarged liver     fatty liver by '09 CT  . Sleep apnea     not currently using CPAP 05/20/13  . Emphysema lung   . 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   Goal of Therapy:  Vancomycin trough level 15-20 mcg/ml  Plan:  -Vancomycin 2000 mg IV x 1, then 1250 mg IV q12h -Zosyn 3.375G IV q8h to be infused over 4 hours -Trend WBC, temp, renal function -Drug levels as indicated -F/U vascular surgery plans   Narda Bonds 05/06/2014,12:19 AM

## 2014-05-06 NOTE — Consult Note (Signed)
[facility]     Reason for Consult: vascular graft infection    Referring Physician: Dr. Wyline Copas  Principal Problem:   Sepsis Active Problems:   COPD   CAD (coronary artery disease) stable non obstructive CAD   PAD (peripheral artery disease)   Infected aortic graft   . aspirin EC  325 mg Oral Daily  . heparin  5,000 Units Subcutaneous 3 times per day  . piperacillin-tazobactam (ZOSYN)  IV  3.375 g Intravenous 3 times per day  . vancomycin  1,250 mg Intravenous Q12H    Recommendations: Continue with broad spectrum antibiotics Narrow to culture, if positive  Will likely need a picc line but will wait to be sure blood cultures are negative  Assessment: He has sepsis from vascular graft from repair of aneursym in 2004.  Culture so far is negative.  Surgical removal not advised so will need to continue with IV antibiotics for 6 weeks and will change to oral suppresive therapy.  This of course will be problematic if culture remains negative.  Dr. Megan Salon will monitor the cultures over the weekend, please call for any issues otherwise.  I will follow up on Monday.   Antibiotics: vancomcyin and zosyn  HPI: Alfred Carney is a 63 y.o. male with CAD with stenting, abdominal aneurysm with repair and grafting done in 2004, PAD, COPD who had several days of malaise, fever then day of admission was up to 102, 103 in ED, some aches all over, abdominal pain, work up revealed infection of graft.  He was started on vancomycin and zosyn after cultures.  Aortic graft was placed after incidental finding of it on CT for other reason.    Review of Systems: A comprehensive review of systems was negative.  Past Medical History  Diagnosis Date  . Morbid obesity   . Chronic obstructive pulmonary disease 2008    Moderate  . Community acquired pneumonia   . Hypertension   . Hyperlipidemia   . Degenerative joint disease   . Coronary artery disease   . Gastroesophageal  reflux disease   . 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 Hypokalemia     diuretic induced, resolved  . Shortness of breath   . Gout   . Colon polyps   . H/O hiatal hernia   . Enlarged liver     fatty liver by '09 CT  . Sleep apnea     not currently using CPAP 05/20/13  . Emphysema lung   . Cancer     skin cancer    History  Substance Use Topics  . Smoking status: Former Research scientist (life sciences)  . Smokeless tobacco: Never Used  . Alcohol Use: No    Family History  Problem Relation Age of Onset  . Osteoarthritis Mother   . Diabetes Mother   . Heart disease Father     Coronary Artery Disease  . Multiple sclerosis Sister   . Factor V Leiden deficiency Sister    No Known Allergies  OBJECTIVE: Blood pressure 130/66, pulse 73, temperature 99.2 F (37.3 C), temperature source Oral, resp. rate 13, height 5\' 9"  (1.753 m), weight 303 lb 5.7 oz (137.6 kg), SpO2 97.00%. General: awake, alert, nad Skin: no rashes Lungs: CTA B Cor: RRR without m Abdomen: obese, nt, nd, normoactive bowel sounds Ext: no edema  Microbiology: Recent  Results (from the past 240 hour(s))  CULTURE, BLOOD (ROUTINE X 2)     Status: None   Collection Time    05/05/14  3:40 PM      Result Value Ref Range Status   Specimen Description BLOOD HAND RIGHT   Final   Special Requests BOTTLES DRAWN AEROBIC AND ANAEROBIC 5CC   Final   Culture  Setup Time     Final   Value: 05/05/2014 22:39     Performed at [facility]   Culture     Final   Value:        BLOOD CULTURE RECEIVED NO GROWTH TO DATE CULTURE WILL BE HELD FOR 5 DAYS BEFORE ISSUING A FINAL NEGATIVE REPORT     Performed at [facility]   Report Status PENDING   Incomplete  CULTURE, BLOOD (ROUTINE X 2)     Status: None   Collection Time    05/05/14  4:30 PM      Result Value Ref Range Status   Specimen Description BLOOD  LEFT HAND   Final   Special Requests BOTTLES DRAWN AEROBIC AND ANAEROBIC 10CC   Final   Culture  Setup Time     Final   Value: 05/05/2014 22:40     Performed at [facility]   Culture     Final   Value:        BLOOD CULTURE RECEIVED NO GROWTH TO DATE CULTURE WILL BE HELD FOR 5 DAYS BEFORE ISSUING A FINAL NEGATIVE REPORT     Performed at [facility]   Report Status PENDING   Incomplete  MRSA PCR SCREENING     Status: None   Collection Time    05/06/14 12:47 AM      Result Value Ref Range Status   MRSA by PCR NEGATIVE  NEGATIVE Final   Comment:            The GeneXpert MRSA Assay (FDA     approved for NASAL specimens     only), is one component of a     comprehensive MRSA colonization     surveillance program. It is not     intended to diagnose MRSA     infection nor to guide or     monitor treatment for     MRSA infections.    Scharlene Gloss, [facility] [place] [email or web address] [phone] pager  [phone] cell 05/06/2014, 2:31 PM

## 2014-05-06 NOTE — Progress Notes (Signed)
Utilization review completed.  

## 2014-05-06 NOTE — Progress Notes (Addendum)
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 The graft was in an aorto- right femoral and left external iliac artery bypass. In addition he required reimplantation of 2 right renal arteries and 1 left renal artery. In reviewing the operative note, the posterior aspect of the graft extended up above the superior mesenteric artery to the level of the celiac axis.  He was admitted yesterday with weakness and a fever. His temperature was as high as 103F.  His workup included a CT scan which showed an inflammatory process in the right lower quadrant. There is inflammation around the right limb of the graft but no abscess. This phlegmon involves the small bowel. Radiology felt that the inflammation of the appendiceal tip was secondary to the inflammatory process adjacent to the graft. There is no perigraft gas. Radiology felt that the infection could be related to a primary graft infection or a neighboring small bowel inflamation.  * The patient is morbidly obese with a BMI of 45. In addition he has significant COPD and coronary artery disease.  * He is currently on Vancomycin and Zosyn which was started after his blood cultures were drawn. So far the cultures are negative. I would agree with Dr. Bridgett Larsson that may be useful to obtain an [place] labeled white blood cell scan to evaluate the entire graft as this process seems to be fairly localized. It would be unusual to have such a focal area of graft infection which makes me think it's more likely related to the adjacent small intestine. Regardless, if he required removal of infected graft, the entire graft could not be removed given the anatomy proximally. The graft is sewn to the perivisceral aorta with reimplantation of the renal arteries and there would be no way to oversew the native artery at this level nor would he be a candidate for a redo  thoracoabdominal exposure and placement of a rifampin coated graft. This would not be technically possible. The only 2 options would potentially be removal of the right limb of the graft and a left to right fem-fem bypass graft, or removal of the aortic graft leaving a segment proximally to sew to and place a rifampin coated aortobiiliac graft. Given his obesity and medical comorbidities any of these options would be associated with significant risk. For this reason I would favor continuing aggressive treatment with antibiotics. Pending his cultures he could potentially stay on long term suppressive po antibiotics.  ADDENDUM: I reviewed his CT scan with radiology. It does appear that the appendix is related to the infectious process adjacent to the graft. Typically with a graft infection there tends to be circumferential inflammation around the graft where his this is all anterior to the graft. In addition there is no air associated with this. General surgery at this point has not recommended appendectomy. All things considered I would recommend treating this with intravenous antibiotics and following the CT scan in hopes that this inflammatory process will resolve. I have ordered an [place] labeled white blood cell scan however to further evaluate the graft.   SUBJECTIVE: no specific complaint  PHYSICAL EXAM: Filed Vitals:   05/05/14 2336 05/05/14 2345 05/06/14 0006 05/06/14 0353  BP:  127/56 148/74 141/60  Pulse:  71 76 71  Temp: 98.6 F (37 C)  99.1 F (37.3 C) 98.5 F (36.9 C)  TempSrc: Oral  Oral Oral  Resp:  8  Height:   5\' 9"  (1.753 m)   Weight:   303 lb 5.7 oz (137.6 kg)   SpO2:  95% 94% 94%  Body mass index is 44.78 kg/(m^2).  Abdomen: Soft and nontender.  No significant lower extremity swelling  LABS: Lab Results  Component Value Date   WBC 12.1* 05/06/2014   HGB 12.1* 05/06/2014   HCT 36.6* 05/06/2014   MCV 81.7 05/06/2014   PLT 196 05/06/2014   White blood cell count has  gone from 18 to 12 since admission.  Lab Results  Component Value Date   CREATININE 1.14 05/06/2014   Lab Results  Component Value Date   INR 1.07 01/28/2014   CBG (last 3)   Recent Labs  05/06/14 0027 05/06/14 0355  GLUCAP 177* 89   Principal Problem:   Sepsis Active Problems:   COPD   CAD (coronary artery disease) stable non obstructive CAD   PAD (peripheral artery disease)   Infected aortic graft  Gae Gallop Beeper: [phone] 05/06/2014

## 2014-05-06 NOTE — Care Management Note (Signed)
    Page 1 of 2   05/10/2014     12:39:12 PM CARE MANAGEMENT NOTE 05/10/2014  Patient:  Alfred Carney, Alfred Carney   Account Number:  [id number]  Date Initiated:  05/06/2014  Documentation initiated by:  Marvetta Gibbons  Subjective/Objective Assessment:   Pt admitted with sepsis from infection of the right iliac limb of the aortobiiliac grafting     Action/Plan:   PTA pt lived at home  with spouse- NCM to follow progression for d/c needs   Anticipated DC Date:  05/10/2014   Anticipated DC Plan:        [place]  CM consult      [facility] Choice  HOME HEALTH   Choice offered to / List presented to:  C-3 Spouse        HH arranged  HH-1 RN  [place].   Status of service:  Completed, signed off Medicare Important Message given?  NO (If response is "NO", the following Medicare IM given date fields will be blank) Date Medicare IM given:   Medicare IM given by:   Date Additional Medicare IM given:   Additional Medicare IM given by:    Discharge Disposition:  [place]  Per UR Regulation:  Reviewed for med. necessity/level of care/duration of stay  If discussed at [place] of Stay Meetings, dates discussed:   05/10/2014    Comments:   05-10-14 1237 Jacqlyn Krauss, RN,BSN [phone] Picc line placed for home ABX therapy. Plan for Home with [facility] services. CM did make referral with [facility] for services. SOC to begin within 24-48 hrs post d/c.   05-09-14 1613 Jacqlyn Krauss, RN,BSN [phone] Pt plan for Picc Line for IV ABX therapy. CM will offer choice fo [facility] services. Will continue to monitor.

## 2014-05-07 DIAGNOSIS — R10819 Abdominal tenderness, unspecified site: Secondary | ICD-10-CM

## 2014-05-07 DIAGNOSIS — I739 Peripheral vascular disease, unspecified: Secondary | ICD-10-CM

## 2014-05-07 LAB — CBC WITH DIFFERENTIAL/PLATELET
Basophils Absolute: 0 10*3/uL (ref 0.0–0.1)
Basophils Relative: 0 % (ref 0–1)
Eosinophils Absolute: 0.1 10*3/uL (ref 0.0–0.7)
Eosinophils Relative: 1 % (ref 0–5)
HCT: 38.6 % — ABNORMAL LOW (ref 39.0–52.0)
Hemoglobin: 12.1 g/dL — ABNORMAL LOW (ref 13.0–17.0)
Lymphocytes Relative: 12 % (ref 12–46)
Lymphs Abs: 1.3 10*3/uL (ref 0.7–4.0)
MCH: 25.8 pg — ABNORMAL LOW (ref 26.0–34.0)
MCHC: 31.3 g/dL (ref 30.0–36.0)
MCV: 82.3 fL (ref 78.0–100.0)
Monocytes Absolute: 0.7 10*3/uL (ref 0.1–1.0)
Monocytes Relative: 6 % (ref 3–12)
Neutro Abs: 8.4 10*3/uL — ABNORMAL HIGH (ref 1.7–7.7)
Neutrophils Relative %: 81 % — ABNORMAL HIGH (ref 43–77)
Platelets: 214 10*3/uL (ref 150–400)
RBC: 4.69 MIL/uL (ref 4.22–5.81)
RDW: 15.8 % — ABNORMAL HIGH (ref 11.5–15.5)
WBC: 10.4 10*3/uL (ref 4.0–10.5)

## 2014-05-07 LAB — BASIC METABOLIC PANEL
Anion gap: 14 (ref 5–15)
BUN: 10 mg/dL (ref 6–23)
CO2: 27 mEq/L (ref 19–32)
Calcium: 9 mg/dL (ref 8.4–10.5)
Chloride: 104 mEq/L (ref 96–112)
Creatinine, Ser: 0.98 mg/dL (ref 0.50–1.35)
GFR calc Af Amer: >90 mL/min (ref >90)
GFR calc non Af Amer: 85 mL/min — ABNORMAL LOW (ref >90)
Glucose, Bld: 140 mg/dL — ABNORMAL HIGH (ref 70–99)
Potassium: 3.5 mEq/L — ABNORMAL LOW (ref 3.7–5.3)
Sodium: 145 mEq/L (ref 137–147)

## 2014-05-07 LAB — GLUCOSE, CAPILLARY
Glucose-Capillary: 134 mg/dL — ABNORMAL HIGH (ref 70–99)
Glucose-Capillary: 128 mg/dL — ABNORMAL HIGH (ref 70–99)
Glucose-Capillary: 94 mg/dL (ref 70–99)
Glucose-Capillary: 116 mg/dL — ABNORMAL HIGH (ref 70–99)
Glucose-Capillary: 98 mg/dL (ref 70–99)
Glucose-Capillary: 177 mg/dL — ABNORMAL HIGH (ref 70–99)

## 2014-05-07 MED ORDER — KETOROLAC TROMETHAMINE 15 MG/ML IJ SOLN
15.0000 mg | Freq: Four times a day (QID) | INTRAMUSCULAR | Status: DC | PRN
  Administered 2014-05-07 – 2014-05-08 (×3): 15 mg via INTRAVENOUS
  Filled 2014-05-07 (×3): qty 1

## 2014-05-07 NOTE — Progress Notes (Signed)
Patient ID: Alfred Carney, male   DOB: [birth year], 63 y.o.   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 aspirin EC  325 mg Oral Daily  . heparin  5,000 Units Subcutaneous 3 times per day  . piperacillin-tazobactam (ZOSYN)  IV  3.375 g Intravenous 3 times per day  . vancomycin  1,250 mg Intravenous Q12H   Objective: Temp:  [97.9 F (36.6 C)-99.9 F (37.7 C)] 99.2 F (37.3 C) (08/22 0700) Pulse Rate:  [60-84] 66 (08/22 0812) Resp:  [13-28] 18 (08/22 0812) BP: (114-155)/(46-97) 155/70 mmHg (08/22 0812) SpO2:  [90 %-97 %] 97 % (08/22 0812) Weight:  [311 lb 1.6 oz (141.114 kg)] 311 lb 1.6 oz (141.114 kg) (08/22 0552)  Lab Results Lab Results  Component Value Date   WBC 12.1* 05/06/2014   HGB 12.1* 05/06/2014   HCT 36.6* 05/06/2014   MCV 81.7 05/06/2014   PLT 196 05/06/2014    Lab Results  Component Value Date   CREATININE 1.14 05/06/2014   BUN 16 05/06/2014   NA 141 05/06/2014   K 3.2* 05/06/2014   CL 105 05/06/2014   CO2 23 05/06/2014    Microbiology: Recent Results (from the past 240 hour(s))  CULTURE, BLOOD (ROUTINE X 2)     Status: None   Collection Time    05/05/14  3:40 PM      Result Value Ref Range Status   Specimen Description BLOOD HAND RIGHT   Final   Special Requests BOTTLES DRAWN AEROBIC AND ANAEROBIC 5CC   Final   Culture  Setup Time     Final   Value: 05/05/2014 22:39     Performed at [facility]   Culture     Final   Value:        BLOOD CULTURE RECEIVED NO GROWTH TO DATE CULTURE WILL BE HELD FOR 5 DAYS BEFORE ISSUING A FINAL NEGATIVE REPORT     Performed at [facility]   Report Status PENDING   Incomplete  CULTURE, BLOOD (ROUTINE X 2)     Status: None   Collection Time   05/05/14  4:30 PM      Result Value Ref Range Status   Specimen Description BLOOD LEFT HAND   Final   Special Requests BOTTLES DRAWN AEROBIC AND ANAEROBIC 10CC   Final   Culture  Setup Time     Final   Value: 05/05/2014 22:40     Performed at [facility]   Culture     Final   Value:        BLOOD CULTURE RECEIVED NO GROWTH TO DATE CULTURE WILL BE HELD FOR 5 DAYS BEFORE ISSUING A FINAL NEGATIVE REPORT     Performed at [facility]   Report Status PENDING   Incomplete  URINE CULTURE     Status: None   Collection Time    05/05/14  5:06 PM      Result Value Ref Range Status   Specimen Description URINE, CLEAN CATCH   Final   Special Requests NONE   Final   Culture  Setup Time  Final   Value: 05/05/2014 22:28     Performed at [place]     Final   Value: NO GROWTH     Performed at [facility]   Culture     Final   Value: NO GROWTH     Performed at [facility]   Report Status 05/06/2014 FINAL   Final  MRSA PCR SCREENING     Status: None   Collection Time    05/06/14 12:47 AM      Result Value Ref Range Status   MRSA by PCR NEGATIVE  NEGATIVE Final   Comment:            The GeneXpert MRSA Assay (FDA     approved for NASAL specimens     only), is one component of a     comprehensive MRSA colonization     surveillance program. It is not     intended to diagnose MRSA     infection nor to guide or     monitor treatment for     MRSA infections.   Assessment: His blood cultures remain negative. He has now defervesced. I will continue empiric vancomycin and piperacillin tazobactam.  Plan: 1. Continue current antibiotics  Michel Bickers, MD [facility] [place] [phone] pager   [phone] cell 05/07/2014, 11:23 AM

## 2014-05-07 NOTE — Progress Notes (Signed)
    Subjective  -  The patient reports that his right lower quadrant pain is much better today.  He denies nausea or vomiting.   Physical Exam:  Abdomen is soft.  Minimal tenderness in the right lower quadrant. Pulmonary: Respirations are nonlabored. Neuro: No focal deficits.       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  Due to the patient's body habitus, he is a very poor candidate for surgical intervention.  The patient has been seen by general surgery as there was also concern for inflammation around his appendix.  General surgery recommend against appendectomy.  The patient has shown some improvement with IV antibiotics which I would continue.  Infectious disease is helping with this.  The patient is scheduled for a tag white blood cell scan on Monday.  Dr. Scot Dock will see the patient back on Monday.  Shakyla Nolley IV, V. WELLS 05/07/2014 9:52 AM --  Filed Vitals:   05/07/14 0700  BP:   Pulse:   Temp: 99.2 F (37.3 C)  Resp:     Intake/Output Summary (Last 24 hours) at 05/07/14 0952 Last data filed at 05/07/14 7412  Gross per 24 hour  Intake   1525 ml  Output    300 ml  Net   1225 ml     Laboratory CBC    Component Value Date/Time   WBC 12.1* 05/06/2014 0120   HGB 12.1* 05/06/2014 0120   HCT 36.6* 05/06/2014 0120   PLT 196 05/06/2014 0120    BMET    Component Value Date/Time   NA 141 05/06/2014 0120   K 3.2* 05/06/2014 0120   CL 105 05/06/2014 0120   CO2 23 05/06/2014 0120   GLUCOSE 173* 05/06/2014 0120   BUN 16 05/06/2014 0120   CREATININE 1.14 05/06/2014 0120   CALCIUM 8.2* 05/06/2014 0120   GFRNONAA 66* 05/06/2014 0120   GFRAA 77* 05/06/2014 0120    COAG Lab Results  Component Value Date   INR 1.07 01/28/2014   INR 1.00 05/20/2013   No results found for this basename: PTT    Antibiotics Anti-infectives   Start     Dose/Rate Route Frequency Ordered Stop   05/06/14 1300  vancomycin (VANCOCIN) 1,250 mg in sodium chloride 0.9 % 250 mL IVPB     1,250 mg 166.7 mL/hr over 90 Minutes Intravenous Every 12 hours 05/06/14 0024     05/06/14 0400  piperacillin-tazobactam (ZOSYN) IVPB 3.375 g     3.375 g 12.5 mL/hr over 240 Minutes Intravenous 3 times per day 05/06/14 0024     05/06/14 0030  vancomycin (VANCOCIN) 2,000 mg in sodium chloride 0.9 % 500 mL IVPB     2,000 mg 250 mL/hr over 120 Minutes Intravenous  Once 05/06/14 0024 05/06/14 0259   05/05/14 1900  piperacillin-tazobactam (ZOSYN) IVPB 3.375 g     3.375 g 100 mL/hr over 30 Minutes Intravenous  Once 05/05/14 1856 05/05/14 1950       V. Leia Alf, M.D. [facility] Office: [phone] Pager:  [phone]

## 2014-05-07 NOTE — Progress Notes (Signed)
Report called to [place], RN on [facility]. Pt's VSS, tylenol given for c/o mild headache, all due meds given. Personal belongings with pt's family at bedside. Pt transferred on monitor to 3W25 via bed.

## 2014-05-07 NOTE — Progress Notes (Signed)
Pt called out for RN and reported that his face felt like it "was on fire". Reported that he also feels slightly SOB but that he always feels SOB. VS obtained and pt had elevated temp 102.4 oral; sats were 94% on RA. Placed pt on [facility] for comfort and also put a cold wash cloth to pts forehead. Turned down the heat in the room and had pts vistors to sit and to try to refrain from making the pt to talk since he was somewhat SOB. Notified MD; no new orders given but will continue to monitor.

## 2014-05-07 NOTE — Progress Notes (Signed)
[facility] PROGRESS NOTE  Alfred Carney RXV:[id number] DOB: Aug 29, 1951 DOA: 05/05/2014 PCP: Helane Rima, MD  Assessment/Plan: 1. 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 Remains on vancomycin and Zosyn after blood cultures obtained. 2. Vascular surg recs noted. Medical management with two broad spec abx for now with no plans for surgery per Vascular. 3. Appendiceal inflammation noted, however felt to be secondary inflammation 4. General surgery has signed off with no plans for appendectomy 5. Consulted ID, recs noted and appreciated 2. CAD 1. Denies any chest pain. 2. On aspirin.  3. Pt initially hypotensive. Blood pressures improved after bp meds held 3. Hyperlipidemia 1. Continue statins once patient can take by mouth. 4. COPD 1. Not wheezing at this time. 5. OSA 1. Patient reports not using CPAP at home.  Code Status: Full Family Communication: Pt in room Disposition Plan: Transfer to medical floor soon   Consultants:  General Surgery  Vascular Surgery  Infectious disease  Procedures:    Antibiotics:  Vancomycin 8/21>>>  Zosyn 8/21>>>   HPI/Subjective: Feels better. No acute events noted overnight  Objective: Filed Vitals:   05/06/14 2318 05/07/14 0319 05/07/14 0552 05/07/14 0700  BP: 138/64 135/46    Pulse: 72 60    Temp: 99.2 F (37.3 C) 97.9 F (36.6 C)  99.2 F (37.3 C)  TempSrc: Oral Oral  Oral  Resp: 20 28    Height:   5\' 10"  (1.778 m)   Weight:   141.114 kg (311 lb 1.6 oz)   SpO2: 93% 92%      Intake/Output Summary (Last 24 hours) at 05/07/14 0903 Last data filed at 05/07/14 5093  Gross per 24 hour  Intake   1525 ml  Output    300 ml  Net   1225 ml   Filed Weights   05/05/14 1453 05/06/14 0006 05/07/14 0552  Weight: 136.079 kg (300 lb) 137.6 kg (303 lb 5.7 oz) 141.114 kg (311 lb 1.6 oz)    Exam:   General:  Awake, diaphoretic, in nad  Cardiovascular: regular, s1, s2  Respiratory:  normal resp effort, no wheezing  Abdomen: soft, obese, nondistended  Musculoskeletal: perfused, no clubbing   Data Reviewed: Basic Metabolic Panel:  Recent Labs Lab 05/05/14 1503 05/06/14 0120  NA 143 141  K 3.3* 3.2*  CL 103 105  CO2 23 23  GLUCOSE 156* 173*  BUN 19 16  CREATININE 1.27 1.14  CALCIUM 9.0 8.2*   Liver Function Tests:  Recent Labs Lab 05/05/14 1503 05/06/14 0120  AST 16 21  ALT 13 14  ALKPHOS 68 65  BILITOT 0.6 0.8  PROT 6.7 6.1  ALBUMIN 3.1* 2.7*   No results found for this basename: LIPASE, AMYLASE,  in the last 168 hours No results found for this basename: AMMONIA,  in the last 168 hours CBC:  Recent Labs Lab 05/05/14 1503 05/06/14 0120  WBC 18.1* 12.1*  NEUTROABS 15.4* 9.8*  HGB 13.6 12.1*  HCT 40.4 36.6*  MCV 81.6 81.7  PLT 218 196   Cardiac Enzymes:  Recent Labs Lab 05/06/14 0120  TROPONINI <0.30   BNP (last 3 results)  Recent Labs  01/28/14 1225  PROBNP 59.1   CBG:  Recent Labs Lab 05/06/14 1545 05/06/14 1956 05/06/14 2318 05/07/14 0320 05/07/14 0810  GLUCAP 145* 145* 134* 128* 94    Recent Results (from the past 240 hour(s))  CULTURE, BLOOD (ROUTINE X 2)     Status: None   Collection Time  05/05/14  3:40 PM      Result Value Ref Range Status   Specimen Description BLOOD HAND RIGHT   Final   Special Requests BOTTLES DRAWN AEROBIC AND ANAEROBIC 5CC   Final   Culture  Setup Time     Final   Value: 05/05/2014 22:39     Performed at [facility]   Culture     Final   Value:        BLOOD CULTURE RECEIVED NO GROWTH TO DATE CULTURE WILL BE HELD FOR 5 DAYS BEFORE ISSUING A FINAL NEGATIVE REPORT     Performed at [facility]   Report Status PENDING   Incomplete  CULTURE, BLOOD (ROUTINE X 2)     Status: None   Collection Time    05/05/14  4:30 PM      Result Value Ref Range Status   Specimen Description BLOOD LEFT HAND   Final   Special Requests BOTTLES DRAWN AEROBIC AND ANAEROBIC 10CC   Final    Culture  Setup Time     Final   Value: 05/05/2014 22:40     Performed at [facility]   Culture     Final   Value:        BLOOD CULTURE RECEIVED NO GROWTH TO DATE CULTURE WILL BE HELD FOR 5 DAYS BEFORE ISSUING A FINAL NEGATIVE REPORT     Performed at [facility]   Report Status PENDING   Incomplete  URINE CULTURE     Status: None   Collection Time    05/05/14  5:06 PM      Result Value Ref Range Status   Specimen Description URINE, CLEAN CATCH   Final   Special Requests NONE   Final   Culture  Setup Time     Final   Value: 05/05/2014 22:28     Performed at [place]     Final   Value: NO GROWTH     Performed at [facility]   Culture     Final   Value: NO GROWTH     Performed at [facility]   Report Status 05/06/2014 FINAL   Final  MRSA PCR SCREENING     Status: None   Collection Time    05/06/14 12:47 AM      Result Value Ref Range Status   MRSA by PCR NEGATIVE  NEGATIVE Final   Comment:            The GeneXpert MRSA Assay (FDA     approved for NASAL specimens     only), is one component of a     comprehensive MRSA colonization     surveillance program. It is not     intended to diagnose MRSA     infection nor to guide or     monitor treatment for     MRSA infections.     Studies: Ct Abdomen Pelvis W Contrast  05/05/2014   CLINICAL DATA:  Right lower quadrant pain, fever, hypertension  EXAM: CT ABDOMEN AND PELVIS WITH CONTRAST  TECHNIQUE: Multidetector CT imaging of the abdomen and pelvis was performed using the standard protocol following bolus administration of intravenous contrast.  CONTRAST:  162mL OMNIPAQUE IOHEXOL 300 MG/ML  SOLN  COMPARISON:  CT 01/28/2014  FINDINGS: Dominant finding in the abdomen pelvis is a inflammatory process just ventral to the right common iliac artery graft repair. This inflammatory response measures approximately 5.0 x  3.6 cm (image 62, series 2. They appendix leads up to this  inflammatory response stent is most likely certainly involved in this inflammatory infectious process. The proximal appendix appear normal. There is a small focus of and inflammation at this level on CTA of 01/28/2014.  There is small amount of fluid along the right pericolic gutter extending along the right iliac vessels (image 71, series 2.  Lung bases are clear. No focal hepatic lesion. Patient status post cholecystectomy. The pancreas, spleen, adrenal glands, and kidneys are normal.  The stomach, small bowel, colon are normal. No free fluid the pelvis. Prostate gland and bladder normal. There is streak artifact generated from the prosthetic on the left. No aggressive osseous lesion.  IMPRESSION: Inflammatory/infectious process involving the tip of the appendix at the level of the aortic graft right common iliac branch. Difficult to tell if the graft is infected such as a mycotic aneurysm and secondary inflammation of the appendix or if the tip of the appendix is infected with secondary involvement of the graft. Recommend both vascular surgery and general surgery consultation in addressing this acute process. CTA of the abdomen may help define the aortic graft involvement.  Findings conveyed toDAN FLOYD on 05/05/2014  at18:50.   Electronically Signed   By: Suzy Bouchard M.D.   On: 05/05/2014 18:51   Dg Chest Port 1 View  05/05/2014   CLINICAL DATA:  FEVER HYPOTENSION. Shortness of breath and dizziness for 1 day. History of COPD, hypertension, emphysema, cancer.  EXAM: PORTABLE CHEST - 1 VIEW  COMPARISON:  01/28/2014 and 05/20/2013  FINDINGS: Heart size is normal. There is chronic change at the left lung base. There is pulmonary vascular congestion but no overt edema.  IMPRESSION: Stable appearance of the chest.   Electronically Signed   By: Shon Hale M.D.   On: 05/05/2014 15:50   Ct Cta Abd/pel W/cm &/or W/o Cm  05/05/2014   CLINICAL DATA:  Fever and hypertension. Concern for aortic graft infection.   EXAM: CTA ABDOMEN AND PELVIS wITHOUT AND WITH CONTRAST  TECHNIQUE: Multidetector CT imaging of the abdomen and pelvis was performed using the standard protocol during bolus administration of intravenous contrast. Multiplanar reconstructed images and MIPs were obtained and reviewed to evaluate the vascular anatomy.  CONTRAST:  129mL OMNIPAQUE IOHEXOL 350 MG/ML SOLN  COMPARISON:  05/05/2014  FINDINGS: BODY WALL: Remote fat necrosis in the left lower anterior chest wall.  LOWER CHEST: Coarse reticular opacities in the lower lungs.  There is a moderate size hiatal hernia.  ABDOMEN/PELVIS:  Liver: No focal abnormality.  Biliary: Cholecystectomy.  Pancreas: Unremarkable.  Spleen: Unremarkable.  Adrenals: Calcification within the left adrenal gland, usually post infectious or posttraumatic.  Kidneys and ureters: Bilateral renal cortical scarring. No hydronephrosis. The right ureter passes mainly inferior to the retroperitoneal inflammatory process.  Bladder: Unremarkable.  Reproductive: Unremarkable.  Bowel: No bowel obstruction or perforation. Appendiceal and small bowel findings below.  Retroperitoneum: Dystrophic calcifications in the left upper quadrant.  Peritoneum: No ascites or pneumoperitoneum.  Vascular: Status post aorto bi-iliac grafting. There is thick fat stranding and heterogeneous density, likely with enhancement, around right common iliac artery graft. The thickened tip of the appendix is indistinguishable from the inflammation, as is a loop of small bowel. There is no contrast extravasation or pseudoaneurysm. Inflammatory changes dissect along the retroperitoneum of the right pelvis. The graft is widely patent. Circumferential thickening at the level of the lower abdominal aorta which is stable from previous, reportedly this is a  combination of native and grafted aorta. No perigraft gas. No arterial contrast within the bowel wall when accounting for oral enteric contrast. With the benefit of current  imaging, early inflammatory changes present 01/28/2014. The infection could be related to primary graft infection or a neighboring small bowel diverticulitis. These findings argue against obstructive appendicitis given the temporality.  OSSEOUS: No acute abnormalities.  Review of the MIP images confirms the above findings.  IMPRESSION: Aorto bi-iliac grafting with infection of the right iliac limb. In light of previous imaging, inflammation of the appendiceal tip is considered secondary, as discussed above. There is phlegmon around the graft but no abscess. Phlegmon involves the small bowel, but no evidence of fistula.   Electronically Signed   By: Jorje Guild M.D.   On: 05/05/2014 21:45    Scheduled Meds: . aspirin EC  325 mg Oral Daily  . heparin  5,000 Units Subcutaneous 3 times per day  . piperacillin-tazobactam (ZOSYN)  IV  3.375 g Intravenous 3 times per day  . 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 If 7PM-7AM, please contact night-coverage at [email or web address], password [facility] 05/07/2014, 9:03 AM  LOS: 2 days

## 2014-05-08 DIAGNOSIS — R197 Diarrhea, unspecified: Secondary | ICD-10-CM

## 2014-05-08 LAB — GLUCOSE, CAPILLARY: Glucose-Capillary: 126 mg/dL — ABNORMAL HIGH (ref 70–99)

## 2014-05-08 LAB — HEMOGLOBIN A1C
Hgb A1c MFr Bld: 6.6 % — ABNORMAL HIGH (ref <5.7)
Mean Plasma Glucose: 143 mg/dL — ABNORMAL HIGH (ref <117)

## 2014-05-08 LAB — VANCOMYCIN, TROUGH: Vancomycin Tr: 15.4 ug/mL (ref 10.0–20.0)

## 2014-05-08 NOTE — Progress Notes (Signed)
[facility] PROGRESS NOTE  Alfred Carney WRU:[id number] DOB: 24-Apr-1951 DOA: 05/05/2014 PCP: Helane Rima, MD  Assessment/Plan: 1. 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 Remains on vancomycin and Zosyn after blood cultures obtained. 2. Vascular surg recs noted. Medical management with two broad spec abx for now with no plans for surgery per Vascular. 3. Inflammation noted on CT, however felt to be secondary inflammation 4. General surgery has signed off with no plans for appendectomy 5. Consulted ID, recs noted for continued abx 2. CAD 1. Denies any chest pain. 2. On aspirin.  3. Pt initially hypotensive. Blood pressures improved after bp meds held 3. Hyperlipidemia 1. Consider resuming statins once patient can take by mouth. 4. COPD 1. Not wheezing at this time. 5. OSA 1. Patient reports not using CPAP at home.  Code Status: Full Family Communication: Pt in room Disposition Plan: Transfer to medical floor soon   Consultants:  General Surgery  Vascular Surgery  Infectious disease  Procedures:    Antibiotics:  Vancomycin 8/21>>>  Zosyn 8/21>>>   HPI/Subjective: Feels better today. No acute events noted.  Objective: Filed Vitals:   05/07/14 1320 05/07/14 1325 05/07/14 2043 05/08/14 0542  BP:   150/65 140/64  Pulse: 81  77 68  Temp: 102.4 F (39.1 C)  98.6 F (37 C) 99.6 F (37.6 C)  TempSrc: Oral  Oral Oral  Resp: 20  18 18   Height:      Weight:    141.3 kg (311 lb 8.2 oz)  SpO2: 94% 96% 95% 94%   No intake or output data in the 24 hours ending 05/08/14 1220 Filed Weights   05/07/14 0552 05/07/14 1244 05/08/14 0542  Weight: 141.114 kg (311 lb 1.6 oz) 141.069 kg (311 lb) 141.3 kg (311 lb 8.2 oz)    Exam:   General:  Awake, diaphoretic, in nad  Cardiovascular: regular, s1, s2  Respiratory: normal resp effort, no wheezing  Abdomen: soft, obese, nondistended  Musculoskeletal: perfused, no clubbing    Data Reviewed: Basic Metabolic Panel:  Recent Labs Lab 05/05/14 1503 05/06/14 0120 05/07/14 1105  NA 143 141 145  K 3.3* 3.2* 3.5*  CL 103 105 104  CO2 23 23 27   GLUCOSE 156* 173* 140*  BUN 19 16 10   CREATININE 1.27 1.14 0.98  CALCIUM 9.0 8.2* 9.0   Liver Function Tests:  Recent Labs Lab 05/05/14 1503 05/06/14 0120  AST 16 21  ALT 13 14  ALKPHOS 68 65  BILITOT 0.6 0.8  PROT 6.7 6.1  ALBUMIN 3.1* 2.7*   No results found for this basename: LIPASE, AMYLASE,  in the last 168 hours No results found for this basename: AMMONIA,  in the last 168 hours CBC:  Recent Labs Lab 05/05/14 1503 05/06/14 0120 05/07/14 1105  WBC 18.1* 12.1* 10.4  NEUTROABS 15.4* 9.8* 8.4*  HGB 13.6 12.1* 12.1*  HCT 40.4 36.6* 38.6*  MCV 81.6 81.7 82.3  PLT 218 196 214   Cardiac Enzymes:  Recent Labs Lab 05/06/14 0120  TROPONINI <0.30   BNP (last 3 results)  Recent Labs  01/28/14 1225  PROBNP 59.1   CBG:  Recent Labs Lab 05/07/14 0810 05/07/14 1209 05/07/14 1644 05/07/14 1953 05/08/14 0415  GLUCAP 94 116* 98 177* 126*    Recent Results (from the past 240 hour(s))  CULTURE, BLOOD (ROUTINE X 2)     Status: None   Collection Time    05/05/14  3:40 PM  Result Value Ref Range Status   Specimen Description BLOOD HAND RIGHT   Final   Special Requests BOTTLES DRAWN AEROBIC AND ANAEROBIC 5CC   Final   Culture  Setup Time     Final   Value: 05/05/2014 22:39     Performed at [facility]   Culture     Final   Value:        BLOOD CULTURE RECEIVED NO GROWTH TO DATE CULTURE WILL BE HELD FOR 5 DAYS BEFORE ISSUING A FINAL NEGATIVE REPORT     Performed at [facility]   Report Status PENDING   Incomplete  CULTURE, BLOOD (ROUTINE X 2)     Status: None   Collection Time    05/05/14  4:30 PM      Result Value Ref Range Status   Specimen Description BLOOD LEFT HAND   Final   Special Requests BOTTLES DRAWN AEROBIC AND ANAEROBIC 10CC   Final   Culture  Setup  Time     Final   Value: 05/05/2014 22:40     Performed at [facility]   Culture     Final   Value:        BLOOD CULTURE RECEIVED NO GROWTH TO DATE CULTURE WILL BE HELD FOR 5 DAYS BEFORE ISSUING A FINAL NEGATIVE REPORT     Performed at [facility]   Report Status PENDING   Incomplete  URINE CULTURE     Status: None   Collection Time    05/05/14  5:06 PM      Result Value Ref Range Status   Specimen Description URINE, CLEAN CATCH   Final   Special Requests NONE   Final   Culture  Setup Time     Final   Value: 05/05/2014 22:28     Performed at [place]     Final   Value: NO GROWTH     Performed at [facility]   Culture     Final   Value: NO GROWTH     Performed at [facility]   Report Status 05/06/2014 FINAL   Final  MRSA PCR SCREENING     Status: None   Collection Time    05/06/14 12:47 AM      Result Value Ref Range Status   MRSA by PCR NEGATIVE  NEGATIVE Final   Comment:            The GeneXpert MRSA Assay (FDA     approved for NASAL specimens     only), is one component of a     comprehensive MRSA colonization     surveillance program. It is not     intended to diagnose MRSA     infection nor to guide or     monitor treatment for     MRSA infections.     Studies: No results found.  Scheduled Meds: . aspirin EC  325 mg Oral Daily  . heparin  5,000 Units Subcutaneous 3 times per day  . piperacillin-tazobactam (ZOSYN)  IV  3.375 g Intravenous 3 times per day  . vancomycin  1,250 mg Intravenous Q12H   Continuous Infusions:    Principal Problem:   Sepsis Active Problems:   COPD   CAD (coronary artery disease) stable non obstructive CAD   PAD (peripheral artery disease)   Infected aortic graft  Time spent: 47min  [facility], [facility] Pager [phone]. If 7PM-7AM, please  contact night-coverage at [email or web address], password [facility] 05/08/2014, 12:20 PM  LOS: 3 days

## 2014-05-08 NOTE — Progress Notes (Signed)
    Subjective  -   Continues to feel better.  Abdominal pain has nearly resolved. Continues to have low-grade fever  Physical Exam:  Cardiovascular: Regular rate and rhythm Pulmonary: Respirations are nonlabored Abdomen: Soft and nontender       Assessment/Plan:    Continue with IV antibiotics.   White blood cell scan tomorrow   Alfred Carney 05/08/2014 9:33 AM --  Filed Vitals:   05/08/14 0542  BP: 140/64  Pulse: 68  Temp: 99.6 F (37.6 C)  Resp: 18   No intake or output data in the 24 hours ending 05/08/14 0933   Laboratory CBC    Component Value Date/Time   WBC 10.4 05/07/2014 1105   HGB 12.1* 05/07/2014 1105   HCT 38.6* 05/07/2014 1105   PLT 214 05/07/2014 1105    BMET    Component Value Date/Time   NA 145 05/07/2014 1105   K 3.5* 05/07/2014 1105   CL 104 05/07/2014 1105   CO2 27 05/07/2014 1105   GLUCOSE 140* 05/07/2014 1105   BUN 10 05/07/2014 1105   CREATININE 0.98 05/07/2014 1105   CALCIUM 9.0 05/07/2014 1105   GFRNONAA 85* 05/07/2014 1105   GFRAA >90 05/07/2014 1105    COAG Lab Results  Component Value Date   INR 1.07 01/28/2014   INR 1.00 05/20/2013   No results found for this basename: PTT    Antibiotics Anti-infectives   Start     Dose/Rate Route Frequency Ordered Stop   05/06/14 1300  vancomycin (VANCOCIN) 1,250 mg in sodium chloride 0.9 % 250 mL IVPB     1,250 mg 166.7 mL/hr over 90 Minutes Intravenous Every 12 hours 05/06/14 0024     05/06/14 0400  piperacillin-tazobactam (ZOSYN) IVPB 3.375 g     3.375 g 12.5 mL/hr over 240 Minutes Intravenous 3 times per day 05/06/14 0024     05/06/14 0030  vancomycin (VANCOCIN) 2,000 mg in sodium chloride 0.9 % 500 mL IVPB     2,000 mg 250 mL/hr over 120 Minutes Intravenous  Once 05/06/14 0024 05/06/14 0259   05/05/14 1900  piperacillin-tazobactam (ZOSYN) IVPB 3.375 g     3.375 g 100 mL/hr over 30 Minutes Intravenous  Once 05/05/14 1856 05/05/14 1950       V. Leia Alf,  M.D. [facility] Office: [phone] Pager:  [phone]  \

## 2014-05-08 NOTE — Progress Notes (Signed)
ANTIBIOTIC CONSULT NOTE - FOLLOW UP  Pharmacy Consult for Vancomycin / Zosyn Indication: Rule out sepsis / possible infected aortic repair  No Known Allergies  Patient Measurements: Height: 5\' 9"  (175.3 cm) Weight: 311 lb 8.2 oz (141.3 kg) IBW/kg (Calculated) : 70.7  Vital Signs: Temp: 99.6 F (37.6 C) (08/23 0542) Temp src: Oral (08/23 0542) BP: 140/64 mmHg (08/23 0542) Pulse Rate: 68 (08/23 0542) Intake/Output from previous day:   Intake/Output from this shift:    Labs:  Recent Labs  05/05/14 1503 05/06/14 0120 05/07/14 1105  WBC 18.1* 12.1* 10.4  HGB 13.6 12.1* 12.1*  PLT 218 196 214  CREATININE 1.27 1.14 0.98   Estimated Creatinine Clearance: 106.5 ml/min (by C-G formula based on Cr of 0.98).  Recent Labs  05/08/14 1227  [place] 15.4     Microbiology: Recent Results (from the past 720 hour(s))  CULTURE, BLOOD (ROUTINE X 2)     Status: None   Collection Time    05/05/14  3:40 PM      Result Value Ref Range Status   Specimen Description BLOOD HAND RIGHT   Final   Special Requests BOTTLES DRAWN AEROBIC AND ANAEROBIC 5CC   Final   Culture  Setup Time     Final   Value: 05/05/2014 22:39     Performed at [facility]   Culture     Final   Value:        BLOOD CULTURE RECEIVED NO GROWTH TO DATE CULTURE WILL BE HELD FOR 5 DAYS BEFORE ISSUING A FINAL NEGATIVE REPORT     Performed at [facility]   Report Status PENDING   Incomplete  CULTURE, BLOOD (ROUTINE X 2)     Status: None   Collection Time    05/05/14  4:30 PM      Result Value Ref Range Status   Specimen Description BLOOD LEFT HAND   Final   Special Requests BOTTLES DRAWN AEROBIC AND ANAEROBIC 10CC   Final   Culture  Setup Time     Final   Value: 05/05/2014 22:40     Performed at [facility]   Culture     Final   Value:        BLOOD CULTURE RECEIVED NO GROWTH TO DATE CULTURE WILL BE HELD FOR 5 DAYS BEFORE ISSUING A FINAL NEGATIVE REPORT     Performed at [facility]   Report Status PENDING   Incomplete  URINE CULTURE     Status: None   Collection Time    05/05/14  5:06 PM      Result Value Ref Range Status   Specimen Description URINE, CLEAN CATCH   Final   Special Requests NONE   Final   Culture  Setup Time     Final   Value: 05/05/2014 22:28     Performed at [place]     Final   Value: NO GROWTH     Performed at [facility]   Culture     Final   Value: NO GROWTH     Performed at [facility]   Report Status 05/06/2014 FINAL   Final  MRSA PCR SCREENING     Status: None   Collection Time    05/06/14 12:47 AM      Result Value Ref Range Status   MRSA by PCR NEGATIVE  NEGATIVE Final   Comment:  The GeneXpert MRSA Assay (FDA     approved for NASAL specimens     only), is one component of a     comprehensive MRSA colonization     surveillance program. It is not     intended to diagnose MRSA     infection nor to guide or     monitor treatment for     MRSA infections.    Anti-infectives   Start     Dose/Rate Route Frequency Ordered Stop   05/06/14 1300  vancomycin (VANCOCIN) 1,250 mg in sodium chloride 0.9 % 250 mL IVPB     1,250 mg 166.7 mL/hr over 90 Minutes Intravenous Every 12 hours 05/06/14 0024     05/06/14 0400  piperacillin-tazobactam (ZOSYN) IVPB 3.375 g     3.375 g 12.5 mL/hr over 240 Minutes Intravenous 3 times per day 05/06/14 0024     05/06/14 0030  vancomycin (VANCOCIN) 2,000 mg in sodium chloride 0.9 % 500 mL IVPB     2,000 mg 250 mL/hr over 120 Minutes Intravenous  Once 05/06/14 0024 05/06/14 0259   05/05/14 1900  piperacillin-tazobactam (ZOSYN) IVPB 3.375 g     3.375 g 100 mL/hr over 30 Minutes Intravenous  Once 05/05/14 1856 05/05/14 1950      Assessment: 63 yo M w/ sepsis and possible infected aortic repair.  This is day #4 of abx. He is afebrile, but spiked a fever of 102.4 on 8/22. WBC trending down to 10.4. Renal function stable with CrCl >  164ml/min. He is feeling better today overall but did have a loose stool so if that continues consider c diff PCR. Vanc trough on 8/23 was therapeutic at 15.4.  Goal of Therapy:  Vancomycin trough level 15-20 mcg/ml Eradication of infection  Plan:  Continue zosyn 3.375gm IV Q8H (4 hr inf) Continue vanc 1250mg  IV Q12H F/u renal fxn, C&S, clinical status  Doy Taaffe J 05/08/2014,2:02 PM

## 2014-05-08 NOTE — Progress Notes (Signed)
Patient ID: Alfred Carney, male   DOB: 1951-03-31, 63 y.o.   MRN: [id number]         [facility]    Date of Admission:  05/05/2014           Day 4 vancomycin        Day 4 piperacillin tazobactam  Principal Problem:   Sepsis Active Problems:   COPD   CAD (coronary artery disease) stable non obstructive CAD   PAD (peripheral artery disease)   Infected aortic graft   . aspirin EC  325 mg Oral Daily  . heparin  5,000 Units Subcutaneous 3 times per day  . piperacillin-tazobactam (ZOSYN)  IV  3.375 g Intravenous 3 times per day  . vancomycin  1,250 mg Intravenous Q12H    Subjective: He is feeling better. His right lower quadrant pain is resolved. He did have one loose bowel movement this morning. Review of Systems: Pertinent items are noted in HPI.  Past Medical History  Diagnosis Date  . Morbid obesity   . Chronic obstructive pulmonary disease 2008    Moderate  . Community acquired pneumonia   . Hypertension   . Hyperlipidemia   . Degenerative joint disease   . Coronary artery disease   . Gastroesophageal reflux disease   . 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 Hypokalemia     diuretic induced, resolved  . Shortness of breath   . Gout   . Colon polyps   . H/O hiatal hernia   . Enlarged liver     fatty liver by '09 CT  . Sleep apnea     not currently using CPAP 05/20/13  . Emphysema lung   . Cancer     skin cancer    History  Substance Use Topics  . Smoking status: Former Research scientist (life sciences)  . Smokeless tobacco: Never Used  . Alcohol Use: No    Family History  Problem Relation Age of Onset  . Osteoarthritis Mother   . Diabetes Mother   . Heart disease Father     Coronary Artery Disease  . Multiple sclerosis Sister   . Factor V Leiden deficiency Sister     No Known Allergies  Objective: Temp:  [98.6 F (37  C)-102.4 F (39.1 C)] 99.6 F (37.6 C) (08/23 0542) Pulse Rate:  [68-81] 68 (08/23 0542) Resp:  [18-20] 18 (08/23 0542) BP: (140-150)/(64-65) 140/64 mmHg (08/23 0542) SpO2:  [94 %-96 %] 94 % (08/23 0542) Weight:  [311 lb 8.2 oz (141.3 kg)] 311 lb 8.2 oz (141.3 kg) (08/23 0542)  General: He is sitting up in a chair visiting with family Skin: No rash Lungs: Clear Cor: Regular S1 and S2 with no murmurs Abdomen: Soft and nontender  Lab Results Lab Results  Component Value Date   WBC 10.4 05/07/2014   HGB 12.1* 05/07/2014   HCT 38.6* 05/07/2014   MCV 82.3 05/07/2014   PLT 214 05/07/2014    Lab Results  Component Value Date   CREATININE 0.98 05/07/2014   BUN 10 05/07/2014   NA 145 05/07/2014   K 3.5* 05/07/2014   CL 104 05/07/2014   CO2 27 05/07/2014    Lab Results  Component Value Date   ALT 14 05/06/2014   AST 21 05/06/2014   ALKPHOS 65 05/06/2014   BILITOT 0.8 05/06/2014  Microbiology: Recent Results (from the past 240 hour(s))  CULTURE, BLOOD (ROUTINE X 2)     Status: None   Collection Time    05/05/14  3:40 PM      Result Value Ref Range Status   Specimen Description BLOOD HAND RIGHT   Final   Special Requests BOTTLES DRAWN AEROBIC AND ANAEROBIC 5CC   Final   Culture  Setup Time     Final   Value: 05/05/2014 22:39     Performed at [facility]   Culture     Final   Value:        BLOOD CULTURE RECEIVED NO GROWTH TO DATE CULTURE WILL BE HELD FOR 5 DAYS BEFORE ISSUING A FINAL NEGATIVE REPORT     Performed at [facility]   Report Status PENDING   Incomplete  CULTURE, BLOOD (ROUTINE X 2)     Status: None   Collection Time    05/05/14  4:30 PM      Result Value Ref Range Status   Specimen Description BLOOD LEFT HAND   Final   Special Requests BOTTLES DRAWN AEROBIC AND ANAEROBIC 10CC   Final   Culture  Setup Time     Final   Value: 05/05/2014 22:40     Performed at [facility]   Culture     Final   Value:        BLOOD CULTURE RECEIVED NO  GROWTH TO DATE CULTURE WILL BE HELD FOR 5 DAYS BEFORE ISSUING A FINAL NEGATIVE REPORT     Performed at [facility]   Report Status PENDING   Incomplete  URINE CULTURE     Status: None   Collection Time    05/05/14  5:06 PM      Result Value Ref Range Status   Specimen Description URINE, CLEAN CATCH   Final   Special Requests NONE   Final   Culture  Setup Time     Final   Value: 05/05/2014 22:28     Performed at [place]     Final   Value: NO GROWTH     Performed at [facility]   Culture     Final   Value: NO GROWTH     Performed at [facility]   Report Status 05/06/2014 FINAL   Final  MRSA PCR SCREENING     Status: None   Collection Time    05/06/14 12:47 AM      Result Value Ref Range Status   MRSA by PCR NEGATIVE  NEGATIVE Final   Comment:            The GeneXpert MRSA Assay (FDA     approved for NASAL specimens     only), is one component of a     comprehensive MRSA colonization     surveillance program. It is not     intended to diagnose MRSA     infection nor to guide or     monitor treatment for     MRSA infections.    Studies/Results: No results found.  Assessment: History of having fever which I presume is due to his graft infection. However if he continues to have diarrhea we will need to check C. difficile PCR.  Plan: 1. Continue vancomycin and piperacillin tazobactam 2. Stool for C. difficile PCR if diarrhea continues  Michel Bickers, MD [facility] [place] [facility] [phone] pager  [phone] cell 05/08/2014, 1:00 PM

## 2014-05-09 ENCOUNTER — Inpatient Hospital Stay (HOSPITAL_COMMUNITY): Payer: BC Managed Care – PPO

## 2014-05-09 ENCOUNTER — Encounter (HOSPITAL_COMMUNITY): Payer: BC Managed Care – PPO

## 2014-05-09 DIAGNOSIS — A419 Sepsis, unspecified organism: Secondary | ICD-10-CM

## 2014-05-09 DIAGNOSIS — R6521 Severe sepsis with septic shock: Secondary | ICD-10-CM

## 2014-05-09 DIAGNOSIS — R6520 Severe sepsis without septic shock: Secondary | ICD-10-CM

## 2014-05-09 LAB — CBC WITH DIFFERENTIAL/PLATELET
Basophils Absolute: 0 10*3/uL (ref 0.0–0.1)
Basophils Relative: 0 % (ref 0–1)
Eosinophils Absolute: 0.1 10*3/uL (ref 0.0–0.7)
Eosinophils Relative: 2 % (ref 0–5)
HCT: 36.7 % — ABNORMAL LOW (ref 39.0–52.0)
Hemoglobin: 11.8 g/dL — ABNORMAL LOW (ref 13.0–17.0)
Lymphocytes Relative: 14 % (ref 12–46)
Lymphs Abs: 1.3 10*3/uL (ref 0.7–4.0)
MCH: 26.3 pg (ref 26.0–34.0)
MCHC: 32.2 g/dL (ref 30.0–36.0)
MCV: 81.9 fL (ref 78.0–100.0)
Monocytes Absolute: 0.8 10*3/uL (ref 0.1–1.0)
Monocytes Relative: 9 % (ref 3–12)
Neutro Abs: 6.7 10*3/uL (ref 1.7–7.7)
Neutrophils Relative %: 75 % (ref 43–77)
Platelets: 248 10*3/uL (ref 150–400)
RBC: 4.48 MIL/uL (ref 4.22–5.81)
RDW: 15.9 % — ABNORMAL HIGH (ref 11.5–15.5)
WBC: 9 10*3/uL (ref 4.0–10.5)

## 2014-05-09 LAB — CLOSTRIDIUM DIFFICILE BY PCR: Toxigenic C. Difficile by PCR: NEGATIVE

## 2014-05-09 MED ORDER — HYDRALAZINE HCL 20 MG/ML IJ SOLN
10.0000 mg | Freq: Four times a day (QID) | INTRAMUSCULAR | Status: DC | PRN
  Administered 2014-05-09: 10 mg via INTRAVENOUS

## 2014-05-09 MED ORDER — KETOROLAC TROMETHAMINE 10 MG PO TABS
10.0000 mg | ORAL_TABLET | Freq: Four times a day (QID) | ORAL | Status: DC | PRN
  Administered 2014-05-10 (×2): 10 mg via ORAL
  Filled 2014-05-09 (×2): qty 1

## 2014-05-09 MED ORDER — ATORVASTATIN CALCIUM 10 MG PO TABS
10.0000 mg | ORAL_TABLET | Freq: Every day | ORAL | Status: DC
  Administered 2014-05-09 – 2014-05-10 (×2): 10 mg via ORAL
  Filled 2014-05-09 (×2): qty 1

## 2014-05-09 MED ORDER — PREDNISONE 20 MG PO TABS
40.0000 mg | ORAL_TABLET | Freq: Every day | ORAL | Status: DC
  Filled 2014-05-09 (×2): qty 2

## 2014-05-09 MED ORDER — CEFTRIAXONE SODIUM 2 G IJ SOLR
2.0000 g | INTRAMUSCULAR | Status: DC
  Administered 2014-05-09 – 2014-05-10 (×2): 2 g via INTRAVENOUS
  Filled 2014-05-09 (×2): qty 2

## 2014-05-09 MED ORDER — PANTOPRAZOLE SODIUM 40 MG PO TBEC
40.0000 mg | DELAYED_RELEASE_TABLET | Freq: Every day | ORAL | Status: DC
  Administered 2014-05-10: 40 mg via ORAL
  Filled 2014-05-09 (×2): qty 1

## 2014-05-09 MED ORDER — LISINOPRIL 5 MG PO TABS
5.0000 mg | ORAL_TABLET | Freq: Every day | ORAL | Status: DC
  Administered 2014-05-09 – 2014-05-10 (×2): 5 mg via ORAL
  Filled 2014-05-09 (×2): qty 1

## 2014-05-09 MED ORDER — METOPROLOL SUCCINATE ER 25 MG PO TB24
25.0000 mg | ORAL_TABLET | Freq: Every day | ORAL | Status: DC
  Administered 2014-05-09 – 2014-05-10 (×2): 25 mg via ORAL
  Filled 2014-05-09 (×2): qty 1

## 2014-05-09 MED ORDER — INDIUM IN-111 OXYQUINOLINE (OXINE) INJECTION: 500.0000 | Freq: Once | INTRAVENOUS | Status: AC | PRN

## 2014-05-09 NOTE — Progress Notes (Signed)
[facility]  Date of Admission:  05/05/2014  Antibiotics: Vancomycin zosyn  Subjective: No fever, no chills, no diarreha  Objective: Temp:  [98.2 F (36.8 C)-99.3 F (37.4 C)] 99.1 F (37.3 C) (08/24 0522) Pulse Rate:  [63-73] 63 (08/24 0657) Resp:  [18] 18 (08/24 0522) BP: (149-188)/(63-86) 153/67 mmHg (08/24 0657) SpO2:  [91 %-95 %] 91 % (08/24 0522) Weight:  [307 lb 11.2 oz (139.572 kg)] 307 lb 11.2 oz (139.572 kg) (08/24 0522)  General: AWake, alert, nad Skin: no rashes Lungs: CTA B Cor: RRR Abdomen: soft, nt, nt Ext: no edema  Lab Results Lab Results  Component Value Date   WBC 9.0 05/09/2014   HGB 11.8* 05/09/2014   HCT 36.7* 05/09/2014   MCV 81.9 05/09/2014   PLT 248 05/09/2014    Lab Results  Component Value Date   CREATININE 0.98 05/07/2014   BUN 10 05/07/2014   NA 145 05/07/2014   K 3.5* 05/07/2014   CL 104 05/07/2014   CO2 27 05/07/2014    Lab Results  Component Value Date   ALT 14 05/06/2014   AST 21 05/06/2014   ALKPHOS 65 05/06/2014   BILITOT 0.8 05/06/2014      Microbiology: Recent Results (from the past 240 hour(s))  CULTURE, BLOOD (ROUTINE X 2)     Status: None   Collection Time    05/05/14  3:40 PM      Result Value Ref Range Status   Specimen Description BLOOD HAND RIGHT   Final   Special Requests BOTTLES DRAWN AEROBIC AND ANAEROBIC 5CC   Final   Culture  Setup Time     Final   Value: 05/05/2014 22:39     Performed at [facility]   Culture     Final   Value:        BLOOD CULTURE RECEIVED NO GROWTH TO DATE CULTURE WILL BE HELD FOR 5 DAYS BEFORE ISSUING A FINAL NEGATIVE REPORT     Performed at [facility]   Report Status PENDING   Incomplete  CULTURE, BLOOD (ROUTINE X 2)     Status: None   Collection Time    05/05/14  4:30 PM      Result Value Ref Range Status   Specimen Description BLOOD LEFT HAND   Final   Special Requests BOTTLES DRAWN AEROBIC AND ANAEROBIC 10CC   Final   Culture  Setup  Time     Final   Value: 05/05/2014 22:40     Performed at [facility]   Culture     Final   Value:        BLOOD CULTURE RECEIVED NO GROWTH TO DATE CULTURE WILL BE HELD FOR 5 DAYS BEFORE ISSUING A FINAL NEGATIVE REPORT     Performed at [facility]   Report Status PENDING   Incomplete  URINE CULTURE     Status: None   Collection Time    05/05/14  5:06 PM      Result Value Ref Range Status   Specimen Description URINE, CLEAN CATCH   Final   Special Requests NONE   Final   Culture  Setup Time     Final   Value: 05/05/2014 22:28     Performed at [place]     Final   Value: NO GROWTH     Performed at [place]     Final  Value: NO GROWTH     Performed at [facility]   Report Status 05/06/2014 FINAL   Final  MRSA PCR SCREENING     Status: None   Collection Time    05/06/14 12:47 AM      Result Value Ref Range Status   MRSA by PCR NEGATIVE  NEGATIVE Final   Comment:            The GeneXpert MRSA Assay (FDA     approved for NASAL specimens     only), is one component of a     comprehensive MRSA colonization     surveillance program. It is not     intended to diagnose MRSA     infection nor to guide or     monitor treatment for     MRSA infections.  CLOSTRIDIUM DIFFICILE BY PCR     Status: None   Collection Time    05/09/14  2:31 AM      Result Value Ref Range Status   C difficile by pcr NEGATIVE  NEGATIVE Final    Studies/Results: No results found.  Assessment/Plan: 1)  Vascular graft infection - fever over the weekend, C diff negative, no further fever.  Dr. Nicole Cella concerns noted of removal of graft.  I will have him continue with IV therapy with vancomycin and ceftriaxone for 6 weeks through September 30th and Dr. Scot Dock is going to recheck CT at that time as long as he is doing well.   -if he does, will continue suppressive antibiotics with Keflex 500 mg bid after completing IV therapy -I will  put in for a picc line -he will need cbc, cmp weekly to [facility] with home health -antibiotics per home health protocol We will arrange follow up in our office in about 2-3 weeks  I will sign off, please call with questions.   Scharlene Gloss, [facility] [place] [email or web address] [phone] pager   [phone] cell 05/09/2014, 12:22 PM

## 2014-05-09 NOTE — Progress Notes (Signed)
   VASCULAR SURGERY ASSESSMENT & PLAN:  * Low grade fever only T (max) = 99.3 F.  WBC is normal.   *  For [place] labelled WBC scan today.   * On IV Vanco & Zosyn (Day 4)  * As per my previous note, if he required removal of an infected graft, the entire graft could not be removed given the anatomy proximally. The graft is sewn to the perivisceral aorta with reimplantation of the renal arteries and there would be no way to oversew the native artery at this level. This would not be technically possible. In addition, this would require a redo thoracoabdominal exposure.  The only 2 options would potentially be removal of the right limb of the graft and a left to right fem-fem bypass graft, or removal of the aortic graft leaving a segment proximally to sew to and place a rifampin coated aortobiiliac graft. Given his obesity and medical comorbidities any of these options would be associated with significant risk. For this reason I would favor continuing aggressive treatment with antibiotics. Pending his cultures he could potentially stay on long term suppressive po antibiotics.   * I reviewed his CT scan with radiology. It does appear that the appendix is related to the infectious process adjacent to the graft. Typically with a graft infection there tends to be circumferential inflammation around the graft. In this case all of the inflammation is anterior to the graft. In addition there is no air associated with this. General surgery at this point has not recommended appendectomy.   * My plan will be for a F/U CT scan in 6 weeks unless he develops high grade fevers or signs of sepsis.   SUBJECTIVE: No specific complaints.   PHYSICAL EXAM: Filed Vitals:   05/08/14 2020 05/09/14 0522 05/09/14 0600 05/09/14 0657  BP: 163/69 188/64 170/63 153/67  Pulse: 73 65  63  Temp: 99.3 F (37.4 C) 99.1 F (37.3 C)    TempSrc: Oral Oral    Resp: 18 18    Height:      Weight:  307 lb 11.2 oz (139.572 kg)      SpO2: 91% 91%     Abdomen: non-tender.  Lungs: clear.  CULTURES: Blood Culture X 2 negative so far. Urine Culture negative.  LABS: Lab Results  Component Value Date   WBC 9.0 05/09/2014   HGB 11.8* 05/09/2014   HCT 36.7* 05/09/2014   MCV 81.9 05/09/2014   PLT 248 05/09/2014   Lab Results  Component Value Date   CREATININE 0.98 05/07/2014   Lab Results  Component Value Date   INR 1.07 01/28/2014   CBG (last 3)   Recent Labs  05/07/14 1644 05/07/14 1953 05/08/14 0415  GLUCAP 98 177* 126*   Principal Problem:   Sepsis Active Problems:   COPD   CAD (coronary artery disease) stable non obstructive CAD   PAD (peripheral artery disease)   Infected aortic graft  Gae Gallop Beeper: [phone] 05/09/2014

## 2014-05-09 NOTE — Progress Notes (Addendum)
[facility] PROGRESS NOTE  Alfred Carney QTM:[id number] DOB: 11/12/1950 DOA: 05/05/2014 PCP: Helane Rima, MD  Assessment/Plan: 1. 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 Currently continued on vancomycin and Zosyn 2. Vascular surg recs noted. Medical management with broad spec abx for now with no plans for surgery per Vascular. 3. Inflammation noted on CT, however felt to be secondary inflammation 4. General surgery has signed off with no plans for appendectomy 5. Consulted ID and following 6. Thus far blood cx neg x 2 2. CAD 1. Denies any chest pain. 2. On aspirin.  3. Pt initially hypotensive. Blood pressures have improved after bp meds held and with IVF 4. Resume home lisinopril and metoprolol 3. Hyperlipidemia 1. Resume statins as tolerated 4. COPD 1. Not wheezing at this time. 5. OSA 1. Patient reports not using CPAP at home. 6. Gout 1. Pt reports mild gout flare 2. Will start on daily prednisone  Code Status: Full Family Communication: Pt in room Disposition Plan: Home when more stable and per discretion   Consultants:  General Surgery  Vascular Surgery  Infectious disease  Procedures:    Antibiotics:  Vancomycin 8/21>>>  Zosyn 8/21>>>   HPI/Subjective: No acute events noted overnight. Pt feels well  Objective: Filed Vitals:   05/08/14 2020 05/09/14 0522 05/09/14 0600 05/09/14 0657  BP: 163/69 188/64 170/63 153/67  Pulse: 73 65  63  Temp: 99.3 F (37.4 C) 99.1 F (37.3 C)    TempSrc: Oral Oral    Resp: 18 18    Height:      Weight:  139.572 kg (307 lb 11.2 oz)    SpO2: 91% 91%     No intake or output data in the 24 hours ending 05/09/14 1031 Filed Weights   05/07/14 1244 05/08/14 0542 05/09/14 0522  Weight: 141.069 kg (311 lb) 141.3 kg (311 lb 8.2 oz) 139.572 kg (307 lb 11.2 oz)    Exam:   General:  Awake, diaphoretic, in nad  Cardiovascular: regular, s1, s2  Respiratory:  normal resp effort, no wheezing  Abdomen: soft, obese, nondistended  Musculoskeletal: perfused, no clubbing   Data Reviewed: Basic Metabolic Panel:  Recent Labs Lab 05/05/14 1503 05/06/14 0120 05/07/14 1105  NA 143 141 145  K 3.3* 3.2* 3.5*  CL 103 105 104  CO2 23 23 27   GLUCOSE 156* 173* 140*  BUN 19 16 10   CREATININE 1.27 1.14 0.98  CALCIUM 9.0 8.2* 9.0   Liver Function Tests:  Recent Labs Lab 05/05/14 1503 05/06/14 0120  AST 16 21  ALT 13 14  ALKPHOS 68 65  BILITOT 0.6 0.8  PROT 6.7 6.1  ALBUMIN 3.1* 2.7*   No results found for this basename: LIPASE, AMYLASE,  in the last 168 hours No results found for this basename: AMMONIA,  in the last 168 hours CBC:  Recent Labs Lab 05/05/14 1503 05/06/14 0120 05/07/14 1105 05/09/14 0315  WBC 18.1* 12.1* 10.4 9.0  NEUTROABS 15.4* 9.8* 8.4* 6.7  HGB 13.6 12.1* 12.1* 11.8*  HCT 40.4 36.6* 38.6* 36.7*  MCV 81.6 81.7 82.3 81.9  PLT 218 196 214 248   Cardiac Enzymes:  Recent Labs Lab 05/06/14 0120  TROPONINI <0.30   BNP (last 3 results)  Recent Labs  01/28/14 1225  PROBNP 59.1   CBG:  Recent Labs Lab 05/07/14 0810 05/07/14 1209 05/07/14 1644 05/07/14 1953 05/08/14 0415  GLUCAP 94 116* 98 177* 126*    Recent Results (from the  past 240 hour(s))  CULTURE, BLOOD (ROUTINE X 2)     Status: None   Collection Time    05/05/14  3:40 PM      Result Value Ref Range Status   Specimen Description BLOOD HAND RIGHT   Final   Special Requests BOTTLES DRAWN AEROBIC AND ANAEROBIC 5CC   Final   Culture  Setup Time     Final   Value: 05/05/2014 22:39     Performed at [facility]   Culture     Final   Value:        BLOOD CULTURE RECEIVED NO GROWTH TO DATE CULTURE WILL BE HELD FOR 5 DAYS BEFORE ISSUING A FINAL NEGATIVE REPORT     Performed at [facility]   Report Status PENDING   Incomplete  CULTURE, BLOOD (ROUTINE X 2)     Status: None   Collection Time    05/05/14  4:30 PM      Result  Value Ref Range Status   Specimen Description BLOOD LEFT HAND   Final   Special Requests BOTTLES DRAWN AEROBIC AND ANAEROBIC 10CC   Final   Culture  Setup Time     Final   Value: 05/05/2014 22:40     Performed at [facility]   Culture     Final   Value:        BLOOD CULTURE RECEIVED NO GROWTH TO DATE CULTURE WILL BE HELD FOR 5 DAYS BEFORE ISSUING A FINAL NEGATIVE REPORT     Performed at [facility]   Report Status PENDING   Incomplete  URINE CULTURE     Status: None   Collection Time    05/05/14  5:06 PM      Result Value Ref Range Status   Specimen Description URINE, CLEAN CATCH   Final   Special Requests NONE   Final   Culture  Setup Time     Final   Value: 05/05/2014 22:28     Performed at [place]     Final   Value: NO GROWTH     Performed at [facility]   Culture     Final   Value: NO GROWTH     Performed at [facility]   Report Status 05/06/2014 FINAL   Final  MRSA PCR SCREENING     Status: None   Collection Time    05/06/14 12:47 AM      Result Value Ref Range Status   MRSA by PCR NEGATIVE  NEGATIVE Final   Comment:            The GeneXpert MRSA Assay (FDA     approved for NASAL specimens     only), is one component of a     comprehensive MRSA colonization     surveillance program. It is not     intended to diagnose MRSA     infection nor to guide or     monitor treatment for     MRSA infections.  CLOSTRIDIUM DIFFICILE BY PCR     Status: None   Collection Time    05/09/14  2:31 AM      Result Value Ref Range Status   C difficile by pcr NEGATIVE  NEGATIVE Final     Studies: No results found.  Scheduled Meds: . aspirin EC  325 mg Oral Daily  . heparin  5,000 Units Subcutaneous 3 times per day  . piperacillin-tazobactam (  ZOSYN)  IV  3.375 g Intravenous 3 times per day  . vancomycin  1,250 mg Intravenous Q12H   Continuous Infusions:    Principal Problem:   Sepsis Active Problems:   COPD    CAD (coronary artery disease) stable non obstructive CAD   PAD (peripheral artery disease)   Infected aortic graft  Time spent: 37min  [facility], [facility] Pager [phone]. If 7PM-7AM, please contact night-coverage at [email or web address], password [facility] 05/09/2014, 10:31 AM  LOS: 4 days

## 2014-05-10 ENCOUNTER — Telehealth: Payer: Self-pay | Admitting: Vascular Surgery

## 2014-05-10 ENCOUNTER — Other Ambulatory Visit: Payer: Self-pay | Admitting: *Deleted

## 2014-05-10 DIAGNOSIS — I714 Abdominal aortic aneurysm, without rupture, unspecified: Secondary | ICD-10-CM

## 2014-05-10 DIAGNOSIS — Z48812 Encounter for surgical aftercare following surgery on the circulatory system: Secondary | ICD-10-CM

## 2014-05-10 DIAGNOSIS — Z5189 Encounter for other specified aftercare: Secondary | ICD-10-CM

## 2014-05-10 LAB — BASIC METABOLIC PANEL
Anion gap: 14 (ref 5–15)
BUN: 12 mg/dL (ref 6–23)
CO2: 21 mEq/L (ref 19–32)
Calcium: 8.8 mg/dL (ref 8.4–10.5)
Chloride: 105 mEq/L (ref 96–112)
Creatinine, Ser: 1 mg/dL (ref 0.50–1.35)
GFR calc Af Amer: 90 mL/min — ABNORMAL LOW (ref >90)
GFR calc non Af Amer: 78 mL/min — ABNORMAL LOW (ref >90)
Glucose, Bld: 113 mg/dL — ABNORMAL HIGH (ref 70–99)
Potassium: 4 mEq/L (ref 3.7–5.3)
Sodium: 140 mEq/L (ref 137–147)

## 2014-05-10 LAB — CBC
HCT: 36 % — ABNORMAL LOW (ref 39.0–52.0)
Hemoglobin: 11.5 g/dL — ABNORMAL LOW (ref 13.0–17.0)
MCH: 26.7 pg (ref 26.0–34.0)
MCHC: 31.9 g/dL (ref 30.0–36.0)
MCV: 83.7 fL (ref 78.0–100.0)
Platelets: 264 10*3/uL (ref 150–400)
RBC: 4.3 MIL/uL (ref 4.22–5.81)
RDW: 15.9 % — ABNORMAL HIGH (ref 11.5–15.5)
WBC: 8.2 10*3/uL (ref 4.0–10.5)

## 2014-05-10 MED ORDER — SODIUM CHLORIDE 0.9 % IJ SOLN
10.0000 mL | INTRAMUSCULAR | Status: DC | PRN
  Administered 2014-05-10: 10 mL

## 2014-05-10 MED ORDER — KETOROLAC TROMETHAMINE 10 MG PO TABS: 10.0000 mg | ORAL_TABLET | Freq: Four times a day (QID) | ORAL | Status: DC | PRN

## 2014-05-10 MED ORDER — VANCOMYCIN HCL 10 G IV SOLR: 1250.0000 mg | Freq: Two times a day (BID) | INTRAVENOUS | Status: DC

## 2014-05-10 MED ORDER — DEXTROSE 5 % IV SOLN: 2.0000 g | INTRAVENOUS | Status: DC

## 2014-05-10 MED ORDER — HEPARIN SOD (PORK) LOCK FLUSH 100 UNIT/ML IV SOLN
250.0000 [IU] | INTRAVENOUS | Status: DC | PRN
  Administered 2014-05-10: 250 [IU]
  Filled 2014-05-10: qty 3

## 2014-05-10 MED ORDER — HEPARIN SOD (PORK) LOCK FLUSH 100 UNIT/ML IV SOLN
250.0000 [IU] | Freq: Every day | INTRAVENOUS | Status: DC
  Filled 2014-05-10: qty 3

## 2014-05-10 NOTE — Discharge Summary (Signed)
Physician Discharge Summary  Alfred Carney QPR:[id number] DOB: [birth year] DOA: 05/05/2014  PCP: Helane Rima, MD  Admit date: 05/05/2014 Discharge date: 05/10/2014  Time spent: 35 minutes  Recommendations for Outpatient Follow-up:  1. Follow up with PCP in 1-2 weeks 2. 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 Briefly, pt presents to the hospital with fevers, hypotension and generalized weakness. Pt was found to be in septic shock secondary to infection from R iliac limb of the aortobiiliac graft and was admitted for further workup.  Hospital Course:  1. Sepsis from infection of the right iliac limb of the aortobiiliac grafting with shock  1. Currently continued on vancomycin and Zosyn 2. Vascular surg recs noted. Medical management with broad spec abx for now with no plans for surgery per Vascular. 3. Inflammation at appendix noted on CT, however felt to be secondary inflammation 4. General surgery has signed off with no plans for appendectomy 5. Consulted ID, recommendations for vanc and rocephin through 9/30 6. PICC placed on 8/25 7. Thus far blood cx neg x 2 8. Tagged WBC scan done 8/25 with findings correlating to graft infection 2. CAD  1. Denied any chest pain. 2. On aspirin.  3. Pt initially hypotensive. Blood pressures have improved after bp meds held and with IVF 4. Resumed home lisinopril and metoprolol 3. Hyperlipidemia  1. Resume statins as tolerated 4. COPD  1. Not wheezing at  this time. 5. OSA  1. Patient reports not using CPAP at home. 6. Gout  1. Pt reported mild gout flare yesterday 2. Improved with PRN PO toradol - would prescribe on discharge  Procedures:  Tagged WBC scan 8/25  Consultations:  Vascular Surgery  General Surgery  ID  Discharge Exam: Filed Vitals:   05/09/14 2216 05/10/14 0632 05/10/14 1327 05/10/14 1545  BP: 135/63 155/72 193/84 164/77  Pulse: 60 54 69   Temp: 98.2 F (36.8 C) 98.8 F (37.1 C) 98.6 F (37 C)   TempSrc: Oral Oral Oral   Resp: 18 18 16    Height:      Weight:  141.295 kg (311 lb 8 oz)    SpO2: 93% 97% 99%     General: Awake, in nad Cardiovascular: regular, s1, s2 Respiratory: normal resp effort, no wheezing  Discharge Instructions     Medication List         aspirin EC 81 MG tablet  Take 81 mg by mouth daily.     cefTRIAXone 2 g in dextrose 5 % 50 mL  Inject 2 g into the vein daily.     hydrochlorothiazide 25 MG tablet  Commonly known as:  HYDRODIURIL  Take 25 mg by mouth daily.     ibuprofen 200 MG tablet  Commonly known as:  ADVIL,MOTRIN  Take 200-400 mg by mouth every 6 (six) hours as needed for pain.     ketorolac 10 MG tablet  Commonly known as:  TORADOL  Take 1 tablet (10 mg total)  by mouth every 6 (six) hours as needed for moderate pain or severe pain.     lisinopril 5 MG tablet  Commonly known as:  PRINIVIL,ZESTRIL  Take 5 mg by mouth daily.     metoprolol succinate 25 MG 24 hr tablet  Commonly known as:  TOPROL-XL  Take 25 mg by mouth daily.     nitroGLYCERIN 0.4 MG SL tablet  Commonly known as:  NITROSTAT  Place 1 tablet (0.4 mg total) under the tongue every 5 (five) minutes x 3 doses as needed for chest pain.     omeprazole 20 MG capsule  Commonly known as:  PRILOSEC  Take 20 mg by mouth daily.     rosuvastatin 5 MG tablet  Commonly known as:  CRESTOR  Take 1 tablet (5 mg total) by mouth daily at 6 PM.     vancomycin 1,250 mg in sodium chloride 0.9 % 250 mL   Inject 1,250 mg into the vein every 12 (twelve) hours.       No Known Allergies Follow-up Information   Follow up with Helane Rima, MD. Schedule an appointment as soon as possible for a visit in 1 week.   Specialty:  Family Medicine   Contact information:   [address] [place] [address] [phone]       Follow up with Angelia Mould, MD. (as scheduled)    Specialty:  Vascular Surgery   Contact information:   [address] [place] [address] [phone]        The results of significant diagnostics from this hospitalization (including imaging, microbiology, ancillary and laboratory) are listed below for reference.    Significant Diagnostic Studies: Ct Abdomen Pelvis W Contrast  05/05/2014   CLINICAL DATA:  Right lower quadrant pain, fever, hypertension  EXAM: CT ABDOMEN AND PELVIS WITH CONTRAST  TECHNIQUE: Multidetector CT imaging of the abdomen and pelvis was performed using the standard protocol following bolus administration of intravenous contrast.  CONTRAST:  127mL OMNIPAQUE IOHEXOL 300 MG/ML  SOLN  COMPARISON:  CT 01/28/2014  FINDINGS: Dominant finding in the abdomen pelvis is a inflammatory process just ventral to the right common iliac artery graft repair. This inflammatory response measures approximately 5.0 x 3.6 cm (image 62, series 2. They appendix leads up to this inflammatory response stent is most likely certainly involved in this inflammatory infectious process. The proximal appendix appear normal. There is a small focus of and inflammation at this level on CTA of 01/28/2014.  There is small amount of fluid along the right pericolic gutter extending along the right iliac vessels (image 71, series 2.  Lung bases are clear. No focal hepatic lesion. Patient status post cholecystectomy. The pancreas, spleen, adrenal glands, and kidneys are normal.  The stomach, small bowel, colon are normal. No free fluid the pelvis. Prostate gland and  bladder normal. There is streak artifact generated from the prosthetic on the left. No aggressive osseous lesion.  IMPRESSION: Inflammatory/infectious process involving the tip of the appendix at the level of the aortic graft right common iliac branch. Difficult to tell if the graft is infected such as a mycotic aneurysm and secondary inflammation of the appendix or if the tip of the appendix is infected with secondary involvement of the graft. Recommend both vascular surgery and general surgery consultation in addressing this acute process. CTA of the abdomen may help define the aortic graft involvement.  Findings conveyed toDAN FLOYD on 05/05/2014  at18:50.   Electronically Signed   By:  Suzy Bouchard M.D.   On: 05/05/2014 18:51   Nm Wbc Scan Tumor  05/10/2014   CLINICAL DATA:  Evaluate for a aorta bifemoral graft infection  EXAM: NUCLEAR MEDICINE LEUKOCYTE SCAN  TECHNIQUE: Following intravenous administration of radiolabeled white blood cells, images of the head, neck, trunk, and extremities were obtained on subsequent days.  RADIOPHARMACEUTICALS:  0.5 MCi In-111 labeled autologous leukocytes.  COMPARISON:  CTA abdomen 05/05/2014  FINDINGS: On the 3 hr whole body imaging, there is a faint focus of uptake in the right lower abdomen at the level of the right proximal iliac artery. On the 24 hr imaging there is mild linear uptake which extends along the course of the right limb of the aortic bypass graft. No evidence of abnormal uptake within the aortic graft above the bifurcation.  IMPRESSION: Tagged white blood cells localizing to the right limb of the aortic bypass graft consistent with graft infection.   Electronically Signed   By: Suzy Bouchard M.D.   On: 05/10/2014 14:32   Dg Chest Port 1 View  05/05/2014   CLINICAL DATA:  FEVER HYPOTENSION. Shortness of breath and dizziness for 1 day. History of COPD, hypertension, emphysema, cancer.  EXAM: PORTABLE CHEST - 1 VIEW  COMPARISON:  01/28/2014 and  05/20/2013  FINDINGS: Heart size is normal. There is chronic change at the left lung base. There is pulmonary vascular congestion but no overt edema.  IMPRESSION: Stable appearance of the chest.   Electronically Signed   By: Shon Hale M.D.   On: 05/05/2014 15:50   Ct Cta Abd/pel W/cm &/or W/o Cm  05/05/2014   CLINICAL DATA:  Fever and hypertension. Concern for aortic graft infection.  EXAM: CTA ABDOMEN AND PELVIS wITHOUT AND WITH CONTRAST  TECHNIQUE: Multidetector CT imaging of the abdomen and pelvis was performed using the standard protocol during bolus administration of intravenous contrast. Multiplanar reconstructed images and MIPs were obtained and reviewed to evaluate the vascular anatomy.  CONTRAST:  171mL OMNIPAQUE IOHEXOL 350 MG/ML SOLN  COMPARISON:  05/05/2014  FINDINGS: BODY WALL: Remote fat necrosis in the left lower anterior chest wall.  LOWER CHEST: Coarse reticular opacities in the lower lungs.  There is a moderate size hiatal hernia.  ABDOMEN/PELVIS:  Liver: No focal abnormality.  Biliary: Cholecystectomy.  Pancreas: Unremarkable.  Spleen: Unremarkable.  Adrenals: Calcification within the left adrenal gland, usually post infectious or posttraumatic.  Kidneys and ureters: Bilateral renal cortical scarring. No hydronephrosis. The right ureter passes mainly inferior to the retroperitoneal inflammatory process.  Bladder: Unremarkable.  Reproductive: Unremarkable.  Bowel: No bowel obstruction or perforation. Appendiceal and small bowel findings below.  Retroperitoneum: Dystrophic calcifications in the left upper quadrant.  Peritoneum: No ascites or pneumoperitoneum.  Vascular: Status post aorto bi-iliac grafting. There is thick fat stranding and heterogeneous density, likely with enhancement, around right common iliac artery graft. The thickened tip of the appendix is indistinguishable from the inflammation, as is a loop of small bowel. There is no contrast extravasation or pseudoaneurysm.  Inflammatory changes dissect along the retroperitoneum of the right pelvis. The graft is widely patent. Circumferential thickening at the level of the lower abdominal aorta which is stable from previous, reportedly this is a combination of native and grafted aorta. No perigraft gas. No arterial contrast within the bowel wall when accounting for oral enteric contrast. With the benefit of current imaging, early inflammatory changes present 01/28/2014. The infection could be related to primary graft infection or a neighboring small bowel diverticulitis. These findings argue  against obstructive appendicitis given the temporality.  OSSEOUS: No acute abnormalities.  Review of the MIP images confirms the above findings.  IMPRESSION: Aorto bi-iliac grafting with infection of the right iliac limb. In light of previous imaging, inflammation of the appendiceal tip is considered secondary, as discussed above. There is phlegmon around the graft but no abscess. Phlegmon involves the small bowel, but no evidence of fistula.   Electronically Signed   By: Jorje Guild M.D.   On: 05/05/2014 21:45    Microbiology: Recent Results (from the past 240 hour(s))  CULTURE, BLOOD (ROUTINE X 2)     Status: None   Collection Time    05/05/14  3:40 PM      Result Value Ref Range Status   Specimen Description BLOOD HAND RIGHT   Final   Special Requests BOTTLES DRAWN AEROBIC AND ANAEROBIC 5CC   Final   Culture  Setup Time     Final   Value: 05/05/2014 22:39     Performed at [facility]   Culture     Final   Value:        BLOOD CULTURE RECEIVED NO GROWTH TO DATE CULTURE WILL BE HELD FOR 5 DAYS BEFORE ISSUING A FINAL NEGATIVE REPORT     Performed at [facility]   Report Status PENDING   Incomplete  CULTURE, BLOOD (ROUTINE X 2)     Status: None   Collection Time    05/05/14  4:30 PM      Result Value Ref Range Status   Specimen Description BLOOD LEFT HAND   Final   Special Requests BOTTLES DRAWN AEROBIC AND  ANAEROBIC 10CC   Final   Culture  Setup Time     Final   Value: 05/05/2014 22:40     Performed at [facility]   Culture     Final   Value:        BLOOD CULTURE RECEIVED NO GROWTH TO DATE CULTURE WILL BE HELD FOR 5 DAYS BEFORE ISSUING A FINAL NEGATIVE REPORT     Performed at [facility]   Report Status PENDING   Incomplete  URINE CULTURE     Status: None   Collection Time    05/05/14  5:06 PM      Result Value Ref Range Status   Specimen Description URINE, CLEAN CATCH   Final   Special Requests NONE   Final   Culture  Setup Time     Final   Value: 05/05/2014 22:28     Performed at [place]     Final   Value: NO GROWTH     Performed at [facility]   Culture     Final   Value: NO GROWTH     Performed at [facility]   Report Status 05/06/2014 FINAL   Final  MRSA PCR SCREENING     Status: None   Collection Time    05/06/14 12:47 AM      Result Value Ref Range Status   MRSA by PCR NEGATIVE  NEGATIVE Final   Comment:            The GeneXpert MRSA Assay (FDA     approved for NASAL specimens     only), is one component of a     comprehensive MRSA colonization     surveillance program. It is not     intended to diagnose MRSA     infection  nor to guide or     monitor treatment for     MRSA infections.  CLOSTRIDIUM DIFFICILE BY PCR     Status: None   Collection Time    05/09/14  2:31 AM      Result Value Ref Range Status   C difficile by pcr NEGATIVE  NEGATIVE Final     Labs: Basic Metabolic Panel:  Recent Labs Lab 05/05/14 1503 05/06/14 0120 05/07/14 1105 05/10/14 0527  NA 143 141 145 140  K 3.3* 3.2* 3.5* 4.0  CL 103 105 104 105  CO2 23 23 27 21   GLUCOSE 156* 173* 140* 113*  BUN 19 16 10 12   CREATININE 1.27 1.14 0.98 1.00  CALCIUM 9.0 8.2* 9.0 8.8   Liver Function Tests:  Recent Labs Lab 05/05/14 1503 05/06/14 0120  AST 16 21  ALT 13 14  ALKPHOS 68 65  BILITOT 0.6 0.8  PROT 6.7 6.1  ALBUMIN  3.1* 2.7*   No results found for this basename: LIPASE, AMYLASE,  in the last 168 hours No results found for this basename: AMMONIA,  in the last 168 hours CBC:  Recent Labs Lab 05/05/14 1503 05/06/14 0120 05/07/14 1105 05/09/14 0315 05/10/14 0527  WBC 18.1* 12.1* 10.4 9.0 8.2  NEUTROABS 15.4* 9.8* 8.4* 6.7  --   HGB 13.6 12.1* 12.1* 11.8* 11.5*  HCT 40.4 36.6* 38.6* 36.7* 36.0*  MCV 81.6 81.7 82.3 81.9 83.7  PLT 218 196 214 248 264   Cardiac Enzymes:  Recent Labs Lab 05/06/14 0120  TROPONINI <0.30   BNP: BNP (last 3 results)  Recent Labs  01/28/14 1225  PROBNP 59.1   CBG:  Recent Labs Lab 05/07/14 0810 05/07/14 1209 05/07/14 1644 05/07/14 1953 05/08/14 0415  GLUCAP 94 116* 98 177* 126*    Signed:  Larrie Lucia K  [facility] 05/10/2014, 4:51 PM

## 2014-05-10 NOTE — Telephone Encounter (Addendum)
Message copied by Doristine Section on Tue May 10, 2014 12:20 PM ------      Message from: Mena Goes      Created: Tue May 10, 2014 11:48 AM      Regarding: Schedule                   ----- Message -----         From: Angelia Mould, MD         Sent: 05/10/2014  10:58 AM           To: Vvs Charge Pool      Subject: charge                                                   He will likely be discharged soon. He was another level I follow up today. He also was a level I follow up visit yesterday. He will need a CT scan of the abdomen and pelvis with IV contrast in 2 months to follow his possible graft infection. Thanks. CD ------  notified patient of fu visit on 07-13-14 11:15 for ct at [name], then to see dr. Scot Dock at 12:45, l/m message for patient, mailed appt. info

## 2014-05-10 NOTE — Progress Notes (Signed)
Confirmed with Nuclear Medicine that patient completed his WBC scan today and does not need to be scanned in am.  Dr. Wyline Copas notified.  Reviewed discharge instructions with patient and wife, they stated their understanding.  Picc line flushed by IV team.  Home Health agency reviewed instructions.  Discharged home with his wife via wheelchair.   Filed Vitals:   05/10/14 1545  BP: 164/77  Pulse:   Temp:   Resp:     Sanda Linger

## 2014-05-10 NOTE — Progress Notes (Signed)
   VASCULAR SURGERY ASSESSMENT & PLAN:  * No significant fevers. The patient now has a PICC line. As per infectious disease, the plan will be for 6 weeks of IV vancomycin and ceftriaxone (through September 30th), with plans for continued suppressive antibiotics with Keflex (500 mg BID) after IV antibiotics is complete. I plan on repeating his CT scan of the abdomen in 2 months. I will arrange for this.  * It looks like he never had it in the am labeled white blood cell scan. However, if it looks like he will require graft removal in the future I can arrange this as an outpatient. Therefore, from my standpoint the patient can be discharged once arrangements are made for his outpatient intravenous antibiotics. I will schedule his CT scan.  SUBJECTIVE: No complaints.  PHYSICAL EXAM: Filed Vitals:   05/09/14 0657 05/09/14 1330 05/09/14 2216 05/10/14 0632  BP: 153/67 179/63 135/63 155/72  Pulse: 63 67 60 54  Temp:  99.1 F (37.3 C) 98.2 F (36.8 C) 98.8 F (37.1 C)  TempSrc:  Oral Oral Oral  Resp:  20 18 18   Height:      Weight:    311 lb 8 oz (141.295 kg)  SpO2:  95% 93% 97%   Abdomen soft and nontender.  LABS: Lab Results  Component Value Date   WBC 8.2 05/10/2014   HGB 11.5* 05/10/2014   HCT 36.0* 05/10/2014   MCV 83.7 05/10/2014   PLT 264 05/10/2014   Lab Results  Component Value Date   CREATININE 1.00 05/10/2014   Lab Results  Component Value Date   INR 1.07 01/28/2014   CBG (last 3)   Recent Labs  05/07/14 1644 05/07/14 1953 05/08/14 0415  GLUCAP 98 177* 126*   C. 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

## 2014-05-10 NOTE — Progress Notes (Signed)
Peripherally Inserted Central Catheter/Midline Placement  The IV Nurse has discussed with the patient and/or persons authorized to consent for the patient, the purpose of this procedure and the potential benefits and risks involved with this procedure.  The benefits include less needle sticks, lab draws from the catheter and patient may be discharged home with the catheter.  Risks include, but not limited to, infection, bleeding, blood clot (thrombus formation), and puncture of an artery; nerve damage and irregular heat beat.  Alternatives to this procedure were also discussed.  PICC/Midline Placement Documentation        Alfred Carney 05/10/2014, 9:37 AM

## 2014-05-10 NOTE — Progress Notes (Signed)
[facility] PROGRESS NOTE  Demarea Lorey QJF:[id number] DOB: 12/23/1950 DOA: 05/05/2014 PCP: Helane Rima, MD  Assessment/Plan: 1. 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 Currently continued on vancomycin and Zosyn 2. Vascular surg recs noted. Medical management with broad spec abx for now with no plans for surgery per Vascular. 3. Inflammation noted on CT, however felt to be secondary inflammation 4. General surgery has signed off with no plans for appendectomy 5. Consulted ID, recommendations for vanc and rocephin through 9/30 6. PICC placed on 8/25 7. Thus far blood cx neg x 2 8. Tagged WBC scan in progress today 2. CAD 1. Denies any chest pain. 2. On aspirin.  3. Pt initially hypotensive. Blood pressures have improved after bp meds held and with IVF 4. Resumed home lisinopril and metoprolol 3. Hyperlipidemia 1. Resume statins as tolerated 4. COPD 1. Not wheezing at this time. 5. OSA 1. Patient reports not using CPAP at home. 6. Gout 1. Pt reported mild gout flare yesterday 2. Improved with PRN PO toradol - would prescribe on discharge  Code Status: Full Family Communication: Pt in room, wife at bedside Disposition Plan: Home when more stable and per discretion   Consultants:  General Surgery  Vascular Surgery  Infectious disease  Procedures:  PICC placed 8/25  Tagged WBC scan 8/25  Antibiotics:  Vancomycin 8/21>>>  Zosyn 8/21>>>   HPI/Subjective: Feels better, eager to go home  Objective: Filed Vitals:   05/09/14 1330 05/09/14 2216 05/10/14 0632 05/10/14 1327  BP: 179/63 135/63 155/72 193/84  Pulse: 67 60 54 69  Temp: 99.1 F (37.3 C) 98.2 F (36.8 C) 98.8 F (37.1 C) 98.6 F (37 C)  TempSrc: Oral Oral Oral Oral  Resp: 20 18 18 16   Height:      Weight:   141.295 kg (311 lb 8 oz)   SpO2: 95% 93% 97% 99%    Intake/Output Summary (Last 24 hours) at 05/10/14 1442 Last data filed at  05/09/14 1900  Gross per 24 hour  Intake    330 ml  Output      0 ml  Net    330 ml   Filed Weights   05/08/14 0542 05/09/14 0522 05/10/14 8937  Weight: 141.3 kg (311 lb 8.2 oz) 139.572 kg (307 lb 11.2 oz) 141.295 kg (311 lb 8 oz)    Exam:   General:  Awake, diaphoretic, in nad  Cardiovascular: regular, s1, s2  Respiratory: normal resp effort, no wheezing  Abdomen: soft, obese, nondistended  Musculoskeletal: perfused, no clubbing   Data Reviewed: Basic Metabolic Panel:  Recent Labs Lab 05/05/14 1503 05/06/14 0120 05/07/14 1105 05/10/14 0527  NA 143 141 145 140  K 3.3* 3.2* 3.5* 4.0  CL 103 105 104 105  CO2 23 23 27 21   GLUCOSE 156* 173* 140* 113*  BUN 19 16 10 12   CREATININE 1.27 1.14 0.98 1.00  CALCIUM 9.0 8.2* 9.0 8.8   Liver Function Tests:  Recent Labs Lab 05/05/14 1503 05/06/14 0120  AST 16 21  ALT 13 14  ALKPHOS 68 65  BILITOT 0.6 0.8  PROT 6.7 6.1  ALBUMIN 3.1* 2.7*   No results found for this basename: LIPASE, AMYLASE,  in the last 168 hours No results found for this basename: AMMONIA,  in the last 168 hours CBC:  Recent Labs Lab 05/05/14 1503 05/06/14 0120 05/07/14 1105 05/09/14 0315 05/10/14 0527  WBC 18.1* 12.1* 10.4 9.0 8.2  NEUTROABS 15.4* 9.8*  8.4* 6.7  --   HGB 13.6 12.1* 12.1* 11.8* 11.5*  HCT 40.4 36.6* 38.6* 36.7* 36.0*  MCV 81.6 81.7 82.3 81.9 83.7  PLT 218 196 214 248 264   Cardiac Enzymes:  Recent Labs Lab 05/06/14 0120  TROPONINI <0.30   BNP (last 3 results)  Recent Labs  01/28/14 1225  PROBNP 59.1   CBG:  Recent Labs Lab 05/07/14 0810 05/07/14 1209 05/07/14 1644 05/07/14 1953 05/08/14 0415  GLUCAP 94 116* 98 177* 126*    Recent Results (from the past 240 hour(s))  CULTURE, BLOOD (ROUTINE X 2)     Status: None   Collection Time    05/05/14  3:40 PM      Result Value Ref Range Status   Specimen Description BLOOD HAND RIGHT   Final   Special Requests BOTTLES DRAWN AEROBIC AND ANAEROBIC 5CC    Final   Culture  Setup Time     Final   Value: 05/05/2014 22:39     Performed at [facility]   Culture     Final   Value:        BLOOD CULTURE RECEIVED NO GROWTH TO DATE CULTURE WILL BE HELD FOR 5 DAYS BEFORE ISSUING A FINAL NEGATIVE REPORT     Performed at [facility]   Report Status PENDING   Incomplete  CULTURE, BLOOD (ROUTINE X 2)     Status: None   Collection Time    05/05/14  4:30 PM      Result Value Ref Range Status   Specimen Description BLOOD LEFT HAND   Final   Special Requests BOTTLES DRAWN AEROBIC AND ANAEROBIC 10CC   Final   Culture  Setup Time     Final   Value: 05/05/2014 22:40     Performed at [facility]   Culture     Final   Value:        BLOOD CULTURE RECEIVED NO GROWTH TO DATE CULTURE WILL BE HELD FOR 5 DAYS BEFORE ISSUING A FINAL NEGATIVE REPORT     Performed at [facility]   Report Status PENDING   Incomplete  URINE CULTURE     Status: None   Collection Time    05/05/14  5:06 PM      Result Value Ref Range Status   Specimen Description URINE, CLEAN CATCH   Final   Special Requests NONE   Final   Culture  Setup Time     Final   Value: 05/05/2014 22:28     Performed at [facility]     Final   Value: NO GROWTH     Performed at [facility]   Culture     Final   Value: NO GROWTH     Performed at [facility]   Report Status 05/06/2014 FINAL   Final  MRSA PCR SCREENING     Status: None   Collection Time    05/06/14 12:47 AM      Result Value Ref Range Status   MRSA by PCR NEGATIVE  NEGATIVE Final   Comment:            The GeneXpert MRSA Assay (FDA     approved for NASAL specimens     only), is one component of a     comprehensive MRSA colonization     surveillance program. It is not     intended to diagnose MRSA     infection  nor to guide or     monitor treatment for     MRSA infections.  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  RADIOPHARMACEUTICALS:  0.5 MCi In-111 labeled autologous leukocytes.  COMPARISON:  CTA abdomen 05/05/2014  FINDINGS: On the 3 hr whole body imaging, there is a faint focus of uptake in the right lower abdomen at the level of the right proximal iliac artery. On the 24 hr imaging there is mild linear uptake which extends along the course of the right limb of the aortic bypass graft. No evidence of abnormal uptake within the aortic graft above the bifurcation.  IMPRESSION: Tagged white blood cells localizing to the right limb of the aortic bypass graft consistent with graft infection.   Electronically Signed   By: Suzy Bouchard M.D.   On: 05/10/2014 14:32    Scheduled Meds: . aspirin EC  325 mg Oral Daily  . atorvastatin  10 mg Oral q1800  . cefTRIAXone (ROCEPHIN)  IV  2 g Intravenous Q24H  . heparin  5,000 Units Subcutaneous 3 times per day  . lisinopril  5 mg Oral Daily  . metoprolol succinate  25 mg Oral Daily  . pantoprazole  40 mg Oral Daily  . vancomycin  1,250 mg Intravenous Q12H   Continuous Infusions:    Principal Problem:   Sepsis Active Problems:   COPD   CAD (coronary artery disease) stable non obstructive CAD   PAD (peripheral artery disease)   Infected aortic graft  Time spent: 45min  Milderd Manocchio, [facility] Pager [phone]. If 7PM-7AM, please contact night-coverage at [email or web address], password [facility] 05/10/2014, 2:42 PM  LOS: 5 days

## 2014-05-10 NOTE — Discharge Instructions (Signed)
Catheter-Associated Bloodstream Infections FAQs WHAT IS A CATHETER-ASSOCIATED BLOODSTREAM INFECTION?  A "central line" or "central catheter" is a tube that is placed into a patient's large vein, usually in the neck, chest, arm, or groin. The catheter is often used to draw blood, or give fluids or medications. It may be left in place for several weeks. A bloodstream infection can occur when bacteria or other germs travel down a "central line" and enter the blood. If you develop a catheter-associated bloodstream infection you may become ill with fevers and chills or the skin around the catheter may become sore and red. CAN A CATHETER-RELATED BLOODSTREAM INFECTION BE TREATED? A catheter-associated bloodstream infection is serious, but often can be successfully treated with antibiotics. The catheter might need to be removed if you develop an infection. WHAT ARE SOME OF THE THINGS THAT HOSPITALS ARE DOING TO PREVENT CATHETER-ASSOCIATED BLOODSTREAM INFECTIONS? To prevent catheter-associated bloodstream infections doctors and nurses will:  Choose a vein where the catheter can be safely inserted and where the risk for infection is small.  Clean their hands with soap and water or an alcohol-based hand rub before putting in the catheter.  Wear a mask, cap, sterile gown, and sterile gloves when putting in the catheter to keep it sterile. The patient will be covered with a sterile sheet.  Clean the patient's skin with an antiseptic cleanser before putting in the catheter.  Clean their hands, wear gloves, and clean the catheter opening with an antiseptic solution before using the catheter to draw blood or give medications. Healthcare providers also clean their hands and wear gloves when changing the bandage that covers the area where the catheter enters the skin.  Decide every day if the patient still needs to have the catheter. The catheter will be removed as soon as it is no longer needed.  Carefully handle  medications and fluids that are given through the catheter. WHAT CAN I DO TO HELP PREVENT A CATHETER-ASSOCIATED BLOODSTREAM INFECTION?   Ask your doctors and nurses to explain why you need the catheter and how long you will have it.  Ask your doctors and nurses if they will be using all of the prevention methods discussed above.  Make sure that all doctors and nurses caring for you clean their hands with soap and water or an alcohol-based hand rub before and after caring for you.  If you do not see your providers clean their hands, please ask them to do so.  If the bandage comes off or becomes wet or dirty, tell your nurse or doctor immediately.  Inform your nurse or doctor if the area around your catheter is sore or red.  Do not let family and friends who visit touch the catheter or the tubing.  Make sure family and friends clean their hands with soap and water or an alcohol-based hand rub before and after visiting you. WHAT DO I NEED TO DO WHEN I [place]? Some patients are sent home from the hospital with a catheter in order to continue their treatment. If you go home with a catheter, your doctors and nurses will explain everything you need to know about taking care of your catheter.  Make sure you understand how to care for the catheter before leaving the hospital. For example, ask for instructions on showering or bathing with the catheter and how to change the catheter dressing.  Make sure you know who to contact if you have questions or problems after you get home.  Make sure you wash your hands with soap and water or an alcohol-based hand rub before handling your catheter.  Watch for the signs and symptoms of catheter-associated bloodstream infection, such as soreness or redness at the catheter site or fever, and call your healthcare provider immediately if any occur. If you have questions, please ask your doctor or nurse. Developed and co-sponsored by [employer]  for [place] [phone]); Infectious Diseases Society of [place] (IDSA); [employer]; Association for Professionals in Infection Control and Epidemiology (APIC); Center for Disease Control (CDC); and The Joint Commission Document Released: 12/28/2010 Document Revised: 05/27/2012 Document Reviewed: 11/08/2013 [facility] Patient Information 2015 [place], [place]. This information is not intended to replace advice given to you by your health care provider. Make sure you discuss any questions you have with your health care provider.    Sepsis Sepsis is a serious infection of your blood or tissues that affects your whole body. The infection that causes sepsis may be bacterial, viral, fungal, or parasitic. Sepsis may be life threatening. Sepsis can cause your blood pressure to drop. This may result in shock. Shock causes your central nervous system and your organs to stop working correctly.  RISK FACTORS Sepsis can happen in anyone, but it is more likely to happen in people who have weakened immune systems. SIGNS AND SYMPTOMS  Symptoms of sepsis can include:  Fever or low body temperature (hypothermia).  Rapid breathing (hyperventilation).  Chills.  Rapid heartbeat (tachycardia).  Confusion or light-headedness.  Trouble breathing.  Urinating much less than usual.  Cool, clammy skin or red, flushed skin.  Other problems with the heart, kidneys, or brain. DIAGNOSIS  Your health care provider will likely do tests to look for an infection, to see if the infection has spread to your blood, and to see how serious your condition is. Tests can include:  Blood tests, including cultures of your blood.  Cultures of other fluids from your body, such as:  Urine.  Pus from wounds.  Mucus coughed up from your lungs.  Urine tests other than cultures.  X-ray exams or other imaging tests. TREATMENT  Treatment will begin with elimination of the source of  infection. If your sepsis is likely caused by a bacterial or fungal infection, you will be given antibiotic or antifungal medicines. You may also receive:  Oxygen.  Fluids through an IV tube.  Medicines to increase your blood pressure.  A machine to clean your blood (dialysis) if your kidneys fail.  A machine to help you breathe if your lungs fail. SEEK IMMEDIATE MEDICAL CARE IF: You get an infection or develop any of the signs and symptoms of sepsis after surgery or a hospitalization. Document Released: 06/01/2003 Document Revised: 09/07/2013 Document Reviewed: 05/10/2013 [facility] Patient Information 2015 [place], [place]. This information is not intended to replace advice given to you by your health care provider. Make sure you discuss any questions you have with your health care provider.

## 2014-05-11 LAB — CULTURE, BLOOD (ROUTINE X 2)
Culture: NO GROWTH
Culture: NO GROWTH

## 2014-05-13 ENCOUNTER — Telehealth: Payer: Self-pay | Admitting: *Deleted

## 2014-05-13 NOTE — Telephone Encounter (Signed)
Alfred Carney, [facility] nurse called to report that Alfred Carney' IV infusions are being performed by his wife without any difficulty. He is doing well and they will only be seeing the patient on a weekly basis. Wife knows to call them if he has any problems in between these visits. Alfred Carney's phone number is [phone] exty [phone].

## 2014-05-22 DIAGNOSIS — Z452 Encounter for adjustment and management of vascular access device: Secondary | ICD-10-CM | POA: Insufficient documentation

## 2014-05-31 ENCOUNTER — Ambulatory Visit (INDEPENDENT_AMBULATORY_CARE_PROVIDER_SITE_OTHER): Payer: BC Managed Care – HMO | Admitting: Internal Medicine

## 2014-05-31 ENCOUNTER — Encounter: Payer: Self-pay | Admitting: Internal Medicine

## 2014-05-31 ENCOUNTER — Telehealth: Payer: Self-pay | Admitting: *Deleted

## 2014-05-31 VITALS — BP 157/84 | HR 61 | Temp 97.5°F | Ht 69.0 in | Wt 301.0 lb

## 2014-05-31 DIAGNOSIS — Z5189 Encounter for other specified aftercare: Secondary | ICD-10-CM

## 2014-05-31 DIAGNOSIS — T827XXD Infection and inflammatory reaction due to other cardiac and vascular devices, implants and grafts, subsequent encounter: Secondary | ICD-10-CM

## 2014-05-31 MED ORDER — CEPHALEXIN 500 MG PO CAPS: 500.0000 mg | ORAL_CAPSULE | Freq: Two times a day (BID) | ORAL | Status: DC

## 2014-05-31 NOTE — Progress Notes (Signed)
   Subjective:    Patient ID: Alfred Carney, male    DOB: 1950/10/15, 63 y.o.   MRN: [id number]  HPI Alfred Carney is a 63 y.o. male with CAD with stenting, abdominal aneurysm with repair and grafting done in 2004, PAD, COPD who had several days of malaise, fever then day of admission was up to 102, 103 in ED, some aches all over, abdominal pain, work up revealed infection of graft with positive findings on CTA. He was started on vancomycin and zosyn after cultures. Aortic graft was placed after incidental finding of it on CT for other reason in 2004.  Had a tagged WBC scan that also was c/w graft infection.  Blood cultures remained negative and he continued on vancomycin and ceftriaxone with a projected course of 6 weeks through September 30th.  Followed by Dr. Scot Dock of vascular surgery and to get follow up CT at the end of October (22nd).  No fever, no chills.  Weekly labs have been reassuring.      Review of Systems  Constitutional: Negative for fever and chills.  Gastrointestinal: Negative for diarrhea.  Skin: Negative for rash.  Neurological: Negative for dizziness and light-headedness.       Objective:   Physical Exam  Constitutional: He appears well-developed and well-nourished. No distress.  Eyes: No scleral icterus.  Cardiovascular: Normal rate, regular rhythm and normal heart sounds.   No murmur heard. Pulmonary/Chest: Effort normal and breath sounds normal. No respiratory distress.  Skin: No rash noted.          Assessment & Plan:

## 2014-05-31 NOTE — Telephone Encounter (Signed)
Verbal order per Dr. Linus Salmons given to McFarlan at [facility] to pull patient's picc line after last dose of IV antibiotic on 06/15/14. Alfred Carney

## 2014-05-31 NOTE — Assessment & Plan Note (Addendum)
He will complete 6 weeks at the end of this month.  I will then transistion him to oral Keflex and he is to get a follow up scan by Dr. Scot Dock in Oct.  Will follow up again in November after seeing Dr. Krystal Clark.  If graft remains in, he will need continued suppression lifetime.  Ideally would be to remove it but not a good candidate.

## 2014-06-06 ENCOUNTER — Telehealth: Payer: Self-pay | Admitting: *Deleted

## 2014-06-06 NOTE — Telephone Encounter (Signed)
Heather from [facility] called to report that the patient's PICC was pulled today as ordered by Dr. Baxter Flattery over the weekend.  Per Nira Conn, the PICC was clotted and multiple cathflow administrations were ineffective.  Please advise if the patient should start on oral antibiotics or if he should have the PICC replaced to continue his IV therapy - originally scheduled to conclude 9/30. Landis Gandy, RN

## 2014-06-06 NOTE — Telephone Encounter (Signed)
Can you call in cephalexin 500mg  QID x 30 day for the patient. This was going to be the plan on 9/30, just starting early since his picc line is dysfunctional

## 2014-06-07 ENCOUNTER — Other Ambulatory Visit: Payer: Self-pay | Admitting: *Deleted

## 2014-06-07 ENCOUNTER — Encounter: Payer: Self-pay | Admitting: Internal Medicine

## 2014-06-07 MED ORDER — CEPHALEXIN 500 MG PO CAPS: 500.0000 mg | ORAL_CAPSULE | Freq: Four times a day (QID) | ORAL | Status: DC

## 2014-06-07 NOTE — Telephone Encounter (Signed)
Sent!

## 2014-07-01 NOTE — Telephone Encounter (Signed)
, °

## 2014-07-08 ENCOUNTER — Other Ambulatory Visit: Payer: Self-pay | Admitting: Vascular Surgery

## 2014-07-08 LAB — CREATININE, SERUM: Creat: 1.24 mg/dL (ref 0.50–1.35)

## 2014-07-08 LAB — BUN: BUN: 21 mg/dL (ref 6–23)

## 2014-07-12 ENCOUNTER — Encounter: Payer: Self-pay | Admitting: Vascular Surgery

## 2014-07-13 ENCOUNTER — Ambulatory Visit (INDEPENDENT_AMBULATORY_CARE_PROVIDER_SITE_OTHER): Payer: BC Managed Care – HMO | Admitting: Vascular Surgery

## 2014-07-13 ENCOUNTER — Ambulatory Visit
Admit: 2014-07-13 | Discharge: 2014-07-13 | Disposition: A | Discharge status: Home / Self Care | Payer: BC Managed Care – HMO | Attending: Vascular Surgery | Admitting: Vascular Surgery

## 2014-07-13 ENCOUNTER — Encounter: Payer: Self-pay | Admitting: Vascular Surgery

## 2014-07-13 VITALS — BP 142/75 | HR 88 | Temp 97.9°F | Resp 20 | Ht 69.0 in | Wt 310.4 lb

## 2014-07-13 DIAGNOSIS — I714 Abdominal aortic aneurysm, without rupture, unspecified: Secondary | ICD-10-CM

## 2014-07-13 DIAGNOSIS — Z48812 Encounter for surgical aftercare following surgery on the circulatory system: Secondary | ICD-10-CM

## 2014-07-13 DIAGNOSIS — I716 Thoracoabdominal aortic aneurysm, without rupture, unspecified: Secondary | ICD-10-CM | POA: Insufficient documentation

## 2014-07-13 MED ORDER — IOHEXOL 350 MG/ML SOLN
75.0000 mL | Freq: Once | INTRAVENOUS | Status: AC | PRN
  Administered 2014-07-13: 75 mL via INTRAVENOUS

## 2014-07-13 NOTE — Progress Notes (Signed)
Patient name: Alfred Carney MRN: [id number] DOB: 1951-02-20 Sex: male  REASON FOR VISIT: follow up of possible infected aortic graft.  HPI: Alfred Carney is a 63 y.o. male with a very complicated history. 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 The graft was in an aorto- right femoral and left external iliac artery bypass. In addition he required reimplantation of 2 right renal arteries and 1 left renal artery. In reviewing the operative note, the posterior aspect of the graft extended up above the superior mesenteric artery to the level of the celiac axis. He was admitted in August of this year with weakness and a fever. His workup included a CT scan which showed an inflammatory process in the right lower quadrant. There is inflammation around the right limb of the graft but no abscess. This phlegmon involves the small bowel. Radiology felt that the inflammation of the appendiceal tip was secondary to the inflammatory process adjacent to the graft. There is no perigraft gas. Radiology felt that the infection could be related to a primary graft infection or a neighboring small bowel inflamation. He  is morbidly obese with a BMI of 45. In addition he has significant COPD and coronary artery disease.   He was treated withVancomycin and Zosyn via a PICC line which was started after his blood cultures were drawn. My feeling was that if he required removal of infected graft, the entire graft could not be removed given the anatomy proximally. The graft is sewn to the perivisceral aorta with reimplantation of the renal arteries and there would be no way to oversew the native artery at this level nor would he be a candidate for a redo thoracoabdominal exposure and placement of a rifampin coated graft. This would not be technically possible. The only 2 options would potentially be removal of the right limb of the graft and a left to right  fem-fem bypass graft, or removal of the aortic graft leaving a segment proximally to sew to and place a rifampin coated aortobiiliac graft. Given his obesity and medical comorbidities any of these options would be associated with significant risk. For this reason I would favored continuing aggressive treatment with antibiotics. In addition,I reviewed his CT scan with radiology. It did appear that the appendix was related to the infectious process adjacent to the graft. Typically with a graft infection there tends to be circumferential inflammation around the graft where his this is all anterior to the graft. In addition there is no air associated with this. General surgery at that point did not recommended appendectomy.  He has completed his intravenous antibiotics and is now on suppressive therapy with cephalexin. He denies fever or chills. Overall he has been feeling quite well.  REVIEW OF SYSTEMS: Valu.Nieves ] denotes positive finding; [  ] denotes negative finding  CARDIOVASCULAR:  [ ]  chest pain   [ ]  dyspnea on exertion    CONSTITUTIONAL:  [ ]  fever   [ ]  chills  PHYSICAL EXAM: Filed Vitals:   07/13/14 1252  BP: 142/75  Pulse: 88  Temp: 97.9 F (36.6 C)  TempSrc: Oral  Resp: 20  Height: 5\' 9"  (1.753 m)  Weight: 310 lb 6.4 oz (140.797 kg)  SpO2: 98%   Body mass index is 45.82 kg/(m^2). GENERAL: The patient is a well-nourished male, in no acute distress. The vital signs are documented above. CARDIOVASCULAR: There is a regular rate and rhythm. PULMONARY: There  is good air exchange bilaterally without wheezing or rales. Abdomen is soft and nontender.  I have reviewed his CT scan which was performed today. The inflammatory process adjacent to the right limb of the graft has resolved.   MEDICAL ISSUES: Thoraco abdominal aneurysm CT scan today shows that the inflammatory process adjacent to the right limb of the aortoiliac graft has resolved. This makes me believe that my original suspicion  that this may be an appendicitis adjacent to the right limb of the graft may be the case. He has completed his intravenous antibiotics and is now continuing suppressive therapy with po cephalexin. I have ordered a follow up CT scan in 6 months and I'll see him back at that time. If this looks good, we'll extend his follow up out to a year. All things considered, given the complexity of his anatomy and previous surgery, I think his CT scan results are very good news.     [facility] [place] [facility]per: [phone]

## 2014-07-13 NOTE — Assessment & Plan Note (Signed)
CT scan today shows that the inflammatory process adjacent to the right limb of the aortoiliac graft has resolved. This makes me believe that my original suspicion that this may be an appendicitis adjacent to the right limb of the graft may be the case. He has completed his intravenous antibiotics and is now continuing suppressive therapy with po cephalexin. I have ordered a follow up CT scan in 6 months and I'll see him back at that time. If this looks good, we'll extend his follow up out to a year. All things considered, given the complexity of his anatomy and previous surgery, I think his CT scan results are very good news.

## 2014-07-13 NOTE — Addendum Note (Signed)
Addended by: Mena Goes on: 07/13/2014 04:12 PM   Modules accepted: Orders

## 2014-07-19 ENCOUNTER — Ambulatory Visit (INDEPENDENT_AMBULATORY_CARE_PROVIDER_SITE_OTHER): Payer: BC Managed Care – HMO | Admitting: Internal Medicine

## 2014-07-19 ENCOUNTER — Encounter: Payer: Self-pay | Admitting: Internal Medicine

## 2014-07-19 VITALS — BP 137/76 | HR 90 | Temp 97.8°F | Wt 309.0 lb

## 2014-07-19 DIAGNOSIS — T827XXD Infection and inflammatory reaction due to other cardiac and vascular devices, implants and grafts, subsequent encounter: Secondary | ICD-10-CM | POA: Diagnosis not present

## 2014-07-19 LAB — COMPLETE METABOLIC PANEL WITH GFR
ALT: 16 U/L (ref 0–53)
AST: 19 U/L (ref 0–37)
Albumin: 4.2 g/dL (ref 3.5–5.2)
Alkaline Phosphatase: 81 U/L (ref 39–117)
BUN: 23 mg/dL (ref 6–23)
CO2: 25 mEq/L (ref 19–32)
Calcium: 9 mg/dL (ref 8.4–10.5)
Chloride: 104 mEq/L (ref 96–112)
Creat: 1.39 mg/dL — ABNORMAL HIGH (ref 0.50–1.35)
GFR, Est African American: 61 mL/min
GFR, Est Non African American: 53 mL/min — ABNORMAL LOW
Glucose, Bld: 157 mg/dL — ABNORMAL HIGH (ref 70–99)
Potassium: 4 mEq/L (ref 3.5–5.3)
Sodium: 140 mEq/L (ref 135–145)
Total Bilirubin: 0.4 mg/dL (ref 0.2–1.2)
Total Protein: 6.7 g/dL (ref 6.0–8.3)

## 2014-07-19 LAB — CBC WITH DIFFERENTIAL/PLATELET
Basophils Absolute: 0 10*3/uL (ref 0.0–0.1)
Basophils Relative: 0 % (ref 0–1)
Eosinophils Absolute: 0.2 10*3/uL (ref 0.0–0.7)
Eosinophils Relative: 3 % (ref 0–5)
HCT: 43.1 % (ref 39.0–52.0)
Hemoglobin: 14.1 g/dL (ref 13.0–17.0)
Lymphocytes Relative: 33 % (ref 12–46)
Lymphs Abs: 2.2 10*3/uL (ref 0.7–4.0)
MCH: 26.1 pg (ref 26.0–34.0)
MCHC: 32.7 g/dL (ref 30.0–36.0)
MCV: 79.8 fL (ref 78.0–100.0)
Monocytes Absolute: 0.5 10*3/uL (ref 0.1–1.0)
Monocytes Relative: 8 % (ref 3–12)
Neutro Abs: 3.8 10*3/uL (ref 1.7–7.7)
Neutrophils Relative %: 56 % (ref 43–77)
Platelets: 213 10*3/uL (ref 150–400)
RBC: 5.4 MIL/uL (ref 4.22–5.81)
RDW: 16.6 % — ABNORMAL HIGH (ref 11.5–15.5)
WBC: 6.8 10*3/uL (ref 4.0–10.5)

## 2014-07-19 MED ORDER — CEPHALEXIN 500 MG PO CAPS: 500.0000 mg | ORAL_CAPSULE | Freq: Two times a day (BID) | ORAL | Status: DC

## 2014-07-19 NOTE — Assessment & Plan Note (Signed)
With complete resolution of his infection and findings on his graft noted on CT, I will consider stopping his antibiotics after another 6 or 7 months rather than continuing lifelong. I will reevaluate him after he follows up with Dr. Rachelle Hora in 6 months time and gets a repeat CAT scan.

## 2014-07-19 NOTE — Progress Notes (Signed)
   Subjective:    Patient ID: Alfred Carney, male    DOB: [birth year], 63 y.o.   MRN: [id number]  HPI Alfred Carney is a 63 y.o. male with CAD with stenting, abdominal aneurysm with repair and grafting done in 2004, PAD, COPD who had several days of malaise, fever then day of admission was up to 102, 103 in ED, some aches all over, abdominal pain, work up revealed infection of graft with positive findings on CTA. He was started on vancomycin and zosyn after cultures. Aortic graft was placed after incidental finding of it on CT for other reason in 2004.  Had a tagged WBC scan that also was c/w graft infection.  Blood cultures remained negative and he continued on vancomycin and ceftriaxone for 6 weeks through September 30th.  Followed by Dr. Scot Dock of vascular surgery and repeat CT scan shows good resolution of infection.  He feels it was likely related to appendix.  All reassuring. No issues with Keflex.        Review of Systems  Constitutional: Negative for fever and chills.  Gastrointestinal: Negative for diarrhea.  Skin: Negative for rash.  Neurological: Negative for dizziness and light-headedness.       Objective:   Physical Exam  Constitutional: He appears well-developed and well-nourished. No distress.  Eyes: No scleral icterus.  Cardiovascular: Normal rate, regular rhythm and normal heart sounds.   Skin: No rash noted.          Assessment & Plan:

## 2014-08-01 DIAGNOSIS — E559 Vitamin D deficiency, unspecified: Secondary | ICD-10-CM | POA: Insufficient documentation

## 2014-08-22 ENCOUNTER — Other Ambulatory Visit: Payer: Self-pay | Admitting: Dermatology

## 2014-08-25 ENCOUNTER — Encounter (HOSPITAL_COMMUNITY): Payer: Self-pay | Admitting: Cardiovascular Disease

## 2014-08-30 ENCOUNTER — Encounter: Payer: Self-pay | Admitting: Internal Medicine

## 2014-09-07 ENCOUNTER — Encounter: Payer: Self-pay | Admitting: Internal Medicine

## 2015-01-13 ENCOUNTER — Other Ambulatory Visit: Payer: Self-pay | Admitting: Vascular Surgery

## 2015-01-13 LAB — BUN: BUN: 20 mg/dL (ref 6–23)

## 2015-01-13 LAB — CREATININE, SERUM: Creat: 1.16 mg/dL (ref 0.50–1.35)

## 2015-01-17 ENCOUNTER — Encounter: Payer: Self-pay | Admitting: Vascular Surgery

## 2015-01-18 ENCOUNTER — Ambulatory Visit (INDEPENDENT_AMBULATORY_CARE_PROVIDER_SITE_OTHER): Payer: Medicare Other | Admitting: Vascular Surgery

## 2015-01-18 ENCOUNTER — Ambulatory Visit
Admission: RE | Admit: 2015-01-18 | Discharge: 2015-01-18 | Disposition: A | Discharge status: Home / Self Care | Payer: Medicare Other | Source: Ambulatory Visit | Attending: Vascular Surgery | Admitting: Vascular Surgery

## 2015-01-18 ENCOUNTER — Encounter: Payer: Self-pay | Admitting: Vascular Surgery

## 2015-01-18 VITALS — BP 121/82 | HR 76 | Temp 98.2°F | Resp 20 | Wt 319.0 lb

## 2015-01-18 DIAGNOSIS — I716 Thoracoabdominal aortic aneurysm, without rupture, unspecified: Secondary | ICD-10-CM

## 2015-01-18 DIAGNOSIS — Z48812 Encounter for surgical aftercare following surgery on the circulatory system: Secondary | ICD-10-CM | POA: Diagnosis not present

## 2015-01-18 DIAGNOSIS — I714 Abdominal aortic aneurysm, without rupture, unspecified: Secondary | ICD-10-CM

## 2015-01-18 MED ORDER — IOPAMIDOL (ISOVUE-370) INJECTION 76%
75.0000 mL | Freq: Once | INTRAVENOUS | Status: AC | PRN
  Administered 2015-01-18: 75 mL via INTRAVENOUS

## 2015-01-18 NOTE — Progress Notes (Signed)
[facility] Specialist of [facility]  Patient name: Alfred Carney MRN: [id number] DOB: 24-May-1951 Sex: male  REASON FOR VISIT: Follow up of aortic graft  HPI: Alfred Carney is a 64 y.o. male with a very complicated history. 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 The graft was in an aorto- right femoral and left external iliac artery bypass. In addition he required reimplantation of 2 right renal arteries and 1 left renal artery. In reviewing the operative note, the posterior aspect of the graft extended up above the superior mesenteric artery to the level of the celiac axis. He was admitted in August of 2015r with weakness and a fever. His workup included a CT scan which showed an inflammatory process in the right lower quadrant. There is inflammation around the right limb of the graft but no abscess. This phlegmon involves the small bowel. Radiology felt that the inflammation of the appendiceal tip was secondary to the inflammatory process adjacent to the graft. There is no perigraft gas. Radiology felt that the infection could be related to a primary graft infection or a neighboring small bowel inflamation. He is morbidly obese with a BMI of 45. In addition he has significant COPD and coronary artery disease.   He was treated withVancomycin and Zosyn via a PICC line which was started after his blood cultures were drawn. My feeling was that if he required removal of infected graft, the entire graft could not be removed given the anatomy proximally. The graft is sewn to the perivisceral aorta with reimplantation of the renal arteries and there would be no way to oversew the native artery at this level nor would he be a candidate for a redo thoracoabdominal exposure and placement of a rifampin coated graft. This would not be technically possible. The only 2 options would potentially be removal of the right limb of the graft and  a left to right fem-fem bypass graft, or removal of the aortic graft leaving a segment proximally to sew to and place a rifampin coated aortobiiliac graft. Given his obesity and medical comorbidities any of these options would be associated with significant risk. For this reason I would favored continuing aggressive treatment with antibiotics. In addition,I reviewed his CT scan with radiology. It did appear that the appendix was related to the infectious process adjacent to the graft. Typically with a graft infection there tends to be circumferential inflammation around the graft where his this is all anterior to the graft. In addition there is no air associated with this. General surgery at that point did not recommended appendectomy.  When I saw him last in October 2015, the inflammatory process adjacent to the right limb of the aortoiliac graft had resolved. This makes me believe that my original suspicion that he had appendicitis adjacent to the right limb of the graft was most likely the etiology. He was being maintained on suppressive therapy with cephalexin. I recommended a CT scan in 6 months.  Since I saw him last, he denies any significant abdominal pain or back pain. He denies fever or chills. He denies any significant change in his medical history.  Past Medical History  Diagnosis Date  . Morbid obesity   . Chronic obstructive pulmonary disease 2008    Moderate  . Community acquired pneumonia   . Hypertension   . Hyperlipidemia   . Degenerative joint disease   . Coronary artery disease   .  Gastroesophageal reflux disease   . 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 Hypokalemia     diuretic induced, resolved  . Shortness of breath   . Gout   . Colon polyps   . H/O hiatal hernia   . Enlarged liver     fatty liver by '09 CT  . Sleep apnea     not currently  using CPAP 05/20/13  . Emphysema lung   . Cancer     skin cancer   Family History  Problem Relation Age of Onset  . Osteoarthritis Mother   . Diabetes Mother   . Heart disease Father     Coronary Artery Disease  . Multiple sclerosis Sister   . Factor V Leiden deficiency Sister    SOCIAL HISTORY: History  Substance Use Topics  . Smoking status: Former Research scientist (life sciences)  . Smokeless tobacco: Never Used  . Alcohol Use: No   No Known Allergies Current Outpatient Prescriptions  Medication Sig Dispense Refill  . aspirin EC 81 MG tablet Take 81 mg by mouth daily.    . cephALEXin (KEFLEX) 500 MG capsule Take 1 capsule (500 mg total) by mouth 2 (two) times daily. 60 capsule 6  . ergocalciferol (VITAMIN D2) 50000 UNITS capsule Take 50,000 Units by mouth once a week.    . hydrochlorothiazide (HYDRODIURIL) 25 MG tablet Take 25 mg by mouth daily.    Marland Kitchen ibuprofen (ADVIL,MOTRIN) 200 MG tablet Take 200-400 mg by mouth every 6 (six) hours as needed for pain.    Marland Kitchen ketorolac (TORADOL) 10 MG tablet Take 1 tablet (10 mg total) by mouth every 6 (six) hours as needed for moderate pain or severe pain. 20 tablet 0  . lisinopril (PRINIVIL,ZESTRIL) 5 MG tablet Take 5 mg by mouth daily.    . metoprolol succinate (TOPROL-XL) 25 MG 24 hr tablet Take 25 mg by mouth daily.    . nitroGLYCERIN (NITROSTAT) 0.4 MG SL tablet Place 1 tablet (0.4 mg total) under the tongue every 5 (five) minutes x 3 doses as needed for chest pain. 25 tablet 4  . omeprazole (PRILOSEC) 20 MG capsule Take 20 mg by mouth daily.    . rosuvastatin (CRESTOR) 5 MG tablet Take 1 tablet (5 mg total) by mouth daily at 6 PM. 30 tablet 6   No current facility-administered medications for this visit.   REVIEW OF SYSTEMS: Valu.Nieves ] denotes positive finding; [  ] denotes negative finding  CARDIOVASCULAR:  [ ]  chest pain   [ ]  chest pressure   [ ]  palpitations   [ ]  orthopnea   [ ]  dyspnea on exertion   [ ]  claudication   [ ]  rest pain   [ ]  DVT   [ ]   phlebitis PULMONARY:   [ ]  productive cough   [ ]  asthma   [ ]  wheezing NEUROLOGIC:   [ ]  weakness  [ ]  paresthesias  [ ]  aphasia  [ ]  amaurosis  [ ]  dizziness HEMATOLOGIC:   [ ]  bleeding problems   [ ]  clotting disorders MUSCULOSKELETAL:  [ ]  joint pain   [ ]  joint swelling [ ]  leg swelling GASTROINTESTINAL: [ ]   blood in stool  [ ]   hematemesis GENITOURINARY:  [ ]   dysuria  [ ]   hematuria PSYCHIATRIC:  [ ]  history of major depression INTEGUMENTARY:  [ ]  rashes  [ ]   ulcers CONSTITUTIONAL:  [ ]  fever   [ ]  chills  PHYSICAL EXAM: Filed Vitals:   01/18/15 1315  BP: 121/82  Pulse: 76  Temp: 98.2 F (36.8 C)  TempSrc: Oral  Resp: 20  Weight: 319 lb (144.697 kg)  SpO2: 97%   GENERAL: The patient is a well-nourished male, in no acute distress. The vital signs are documented above. CARDIOVASCULAR: There is a regular rate and rhythm. I do not detect carotid bruits. Both feet are warm and well-perfused. He has moderate bilateral lower extremity swelling. PULMONARY: There is good air exchange bilaterally without wheezing or rales. ABDOMEN: Soft and non-tender with normal pitched bowel sounds.  MUSCULOSKELETAL: There are no major deformities or cyanosis. NEUROLOGIC: No focal weakness or paresthesias are detected. SKIN: There are no ulcers or rashes noted. PSYCHIATRIC: The patient has a normal affect.  DATA:  I reviewed his CT of the aortic graft today. This showed no evidence of aneurysmal disease involving the native abdominal aorta superior to the graft. There was a stable 70-75% stenosis at the origin of the celiac axis. I do not see any evidence of graft infection on his CT. The area of concern over the right iliac limb of his graft has resolved.  MEDICAL ISSUES: STATUS POST REPAIR OF TYPE IV THORACOABDOMINAL ANEURYSM: The patient is now had 2 CAT scans which showed resolution of the infection overlying the right limb of his aortic graft. I think most likely he had appendicitis which is  resolved. He has been on suppressive therapy with Keflex and if infectious disease agrees a think it would be reasonable to stop this at this point. I have recommended a follow up CT scan in 1 year and I'll see him back at that time. He knows to call sooner if he has any problems with fever or chills.   Return in about 1 year (around 01/18/2016).   Deitra Mayo [facility] of [place]: [phone]

## 2015-01-19 ENCOUNTER — Encounter: Payer: Self-pay | Admitting: Internal Medicine

## 2015-01-19 ENCOUNTER — Ambulatory Visit (INDEPENDENT_AMBULATORY_CARE_PROVIDER_SITE_OTHER): Payer: Medicare Other | Admitting: Internal Medicine

## 2015-01-19 VITALS — BP 158/117 | HR 67 | Temp 97.6°F | Ht 69.0 in | Wt 322.0 lb

## 2015-01-19 DIAGNOSIS — T827XXD Infection and inflammatory reaction due to other cardiac and vascular devices, implants and grafts, subsequent encounter: Secondary | ICD-10-CM

## 2015-01-19 NOTE — Progress Notes (Signed)
   Subjective:    Patient ID: Alfred Carney, male    DOB: 10/07/1950, 64 y.o.   MRN: [id number]  HPI Carlus Stay is a 64 y.o. male with CAD with stenting, abdominal aneurysm with repair and grafting done in 2004, PAD, COPD who had several days of malaise, fever then day of admission was up to 102, 103 in ED, some aches all over, abdominal pain, work up revealed infection of graft with positive findings on CTA. He was started on vancomycin and zosyn after cultures. Aortic graft was placed after incidental finding of AA on CT for other reason in 2004.  Had a tagged WBC scan that also was thought to be c/w graft infection.  Blood cultures remained negative and he continued on vancomycin and ceftriaxone for 6 weeks through September 30th.  Followed by Dr. Scot Dock of vascular surgery and repeat CT scan shows good resolution of infection.  He feels it was likely related to appendix.  All reassuring.       Just had a repeat CT angio as well this week and is again without signs of graft infection and not c/w ongoing issue.  Felt by Dr. Scot Dock that it has not been graft related, and I agree.      Review of Systems  Constitutional: Negative for fever and chills.  Gastrointestinal: Negative for diarrhea.  Skin: Negative for rash.  Neurological: Negative for dizziness and light-headedness.       Objective:   Physical Exam  Constitutional: He appears well-developed and well-nourished. No distress.  Eyes: No scleral icterus.  Cardiovascular: Normal rate, regular rhythm and normal heart sounds.   Skin: No rash noted.          Assessment & Plan:

## 2015-01-19 NOTE — Addendum Note (Signed)
Addended by: Mena Goes on: 01/19/2015 12:31 PM   Modules accepted: Orders

## 2015-01-19 NOTE — Assessment & Plan Note (Signed)
I agree that this was not c/w graft infection with results and have told patient to stop the antibiotic suppressive therapy.  He can return as needed.

## 2015-05-16 ENCOUNTER — Institutional Professional Consult (permissible substitution): Payer: Medicare Other | Admitting: Internal Medicine

## 2015-05-17 ENCOUNTER — Ambulatory Visit (INDEPENDENT_AMBULATORY_CARE_PROVIDER_SITE_OTHER): Payer: Medicare Other | Admitting: Internal Medicine

## 2015-05-17 ENCOUNTER — Ambulatory Visit (INDEPENDENT_AMBULATORY_CARE_PROVIDER_SITE_OTHER)
Admission: RE | Admit: 2015-05-17 | Discharge: 2015-05-17 | Disposition: A | Discharge status: Home / Self Care | Payer: Medicare Other | Source: Ambulatory Visit | Attending: Internal Medicine | Admitting: Internal Medicine

## 2015-05-17 ENCOUNTER — Encounter: Payer: Self-pay | Admitting: Internal Medicine

## 2015-05-17 VITALS — BP 138/58 | HR 73 | Ht 69.0 in | Wt 329.0 lb

## 2015-05-17 DIAGNOSIS — J449 Chronic obstructive pulmonary disease, unspecified: Secondary | ICD-10-CM

## 2015-05-17 DIAGNOSIS — I10 Essential (primary) hypertension: Secondary | ICD-10-CM

## 2015-05-17 MED ORDER — VALSARTAN 80 MG PO TABS: ORAL_TABLET | ORAL | Status: DC

## 2015-05-17 NOTE — Assessment & Plan Note (Addendum)
Body mass index is 48.56   Lab Results  Component Value Date   TSH 2.020 01/28/2014     Contributing to gerd tendency/ doe/reviewed need  achieve and maintain neg calorie balance > defer f/u primary care including intermittently monitoring thyroid status

## 2015-05-17 NOTE — Patient Instructions (Signed)
Stop lisinopril   Valsartan 80 mg one daily in place of lisinopril  Prilosec Take 30- 60 min before your first and last meals of the day just until you return   GERD (REFLUX)  is an extremely common cause of respiratory symptoms just like yours , many times with no obvious heartburn at all.    It can be treated with medication, but also with lifestyle changes including elevation of the head of your bed (ideally with 6 inch  bed blocks),  Smoking cessation, avoidance of late meals, excessive alcohol, and avoid fatty foods, chocolate, peppermint, colas, red wine, and acidic juices such as orange juice.  NO MINT OR MENTHOL PRODUCTS SO NO COUGH DROPS  USE SUGARLESS CANDY INSTEAD (Jolley ranchers or Stover's or Life Savers) or even ice chips will also do - the key is to swallow to prevent all throat clearing. NO OIL BASED VITAMINS - use powdered substitutes.  Please remember to go to the  x-ray department downstairs for your tests - we will call you with the results when they are available.     Please schedule a follow up office visit in 6 weeks, call sooner if needed with pfts

## 2015-05-17 NOTE — Progress Notes (Signed)
Quick Note:  LMTCB ______ 

## 2015-05-17 NOTE — Progress Notes (Signed)
Subjective:    Patient ID: Alfred Carney, male    DOB: 1951-03-15,   MRN: [id number]  HPI  70 yowm quit smoking 2008 with baseline weight of 240 when he stopped smoking in March of 2008 p pna with breathing only some better then  worse since summer 2015 on so referred 05/17/2015 to pulmonary clinic by Dr Helane Rima for copd eval as had been seen here in 2012 with GOLD II criteria.    05/17/2015 1st [facility] office visit/ Alfred Carney  / copd eval  Chief Complaint  Patient presents with  . Pulmonary Consult    Referre by Dr. Helane Rima. Pt c/o SOB x 10 yrs, gradually worse over the past yr.  He states that he gets SOB with minimal exertion such as taking a shower or walking 49 yrds on flat surface.   indolent onset progressive doe but always ok at rest/  Sleeps in recliner x 10 years Has osa/ could not tol cpap and tends to fall asleep p eats Assoc with sense of nasal and throat congestion but very little cough - does have hb but controls with prn ppi Had been on several different inhalers including according to the records Spiriva but didn't think any of them really helped and stopped them years ago.  No obvious other patterns in day to day or daytime variabilty or assoc    cp or chest tightness, subjective wheeze overt sinus  symptoms. No unusual exp hx or h/o childhood pna/ asthma or knowledge of premature birth.  Sleeping ok without nocturnal  or early am exacerbation  of respiratory  c/o's or need for noct saba. Also denies any obvious fluctuation of symptoms with weather or environmental changes or other aggravating or alleviating factors except as outlined above   Current Medications, Allergies, Complete Past Medical History, Past Surgical History, Family History, and Social History were reviewed in [employer] record.          Review of Systems  Constitutional: Negative for fever, chills, activity change, appetite change and unexpected weight change.    HENT: Positive for congestion and trouble swallowing. Negative for dental problem, postnasal drip, rhinorrhea, sneezing, sore throat and voice change.   Eyes: Negative for visual disturbance.  Respiratory: Positive for shortness of breath. Negative for cough and choking.   Cardiovascular: Positive for leg swelling. Negative for chest pain.  Gastrointestinal: Negative for nausea, vomiting and abdominal pain.  Genitourinary: Negative for difficulty urinating.       Acid heartburn  Indigestion  Musculoskeletal: Positive for arthralgias.  Skin: Negative for rash.  Psychiatric/Behavioral: Negative for behavioral problems and confusion.       Objective:   Physical Exam  amb obese wm with gruff voice  Wt Readings from Last 3 Encounters:  05/17/15 329 lb (149.233 kg)  01/19/15 322 lb (146.058 kg)  01/18/15 319 lb (144.697 kg)    Vital signs reviewed  HEENT: nl dentition, turbinates, and orophanx. Nl external ear canals without cough reflex   NECK :  without JVD/Nodes/TM/ nl carotid upstrokes bilaterally   LUNGS: no acc muscle use, clear to A and P bilaterally without cough on insp or exp maneuvers   CV:  RRR  no s3 or murmur or increase in P2, no edema   ABD:  soft and nontender with nl excursion in the supine position. No bruits or organomegaly, bowel sounds nl  MS:  warm without deformities, calf tenderness, cyanosis or clubbing  SKIN: warm and dry without  lesions    NEURO:  alert, approp, no deficits    CXR PA and Lateral:   05/17/2015 :     I personally reviewed images and agree with radiology impression as follows:    Emphysema without acute disease.    Assessment & Plan:

## 2015-05-17 NOTE — Assessment & Plan Note (Signed)

## 2015-05-18 ENCOUNTER — Encounter: Payer: Self-pay | Admitting: Internal Medicine

## 2015-05-18 ENCOUNTER — Telehealth: Payer: Self-pay | Admitting: Internal Medicine

## 2015-05-18 NOTE — Assessment & Plan Note (Addendum)
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 - 05/17/2015  Walked RA  2 laps @ 185 ft each stopped due to  Sob/ desat at nl pace to 83%   When respiratory symptoms begin or become refractory well after a patient reports complete smoking cessation,  Especially when this wasn't the case while they were smoking, a red flag is raised based on the work of Dr Kris Mouton which states:  if you quit smoking when your best day FEV1 is still relatively preserved it is highly unlikely you will progress to severe disease.  That is to say, once the smoking stops,  the symptoms should not suddenly erupt or markedly worsen.  If so, the differential diagnosis should include  obesity/deconditioning,  LPR/Reflux/Aspiration syndromes,  occult CHF, or  especially side effect of medications commonly used in this population.    I suspect a combination of the 3 highlighted problems here. He is obviously gained significant weight him and he has reflux, and is on Ace inhibitors, and is very difficult to sort out the cause and effect among those 3 by his history. The best approach therefore is to work on weight loss, treating for reflux aggressively, and try off ACE inhibitors prior to returning here in 6 weeks for follow-up PFTs  Total time = 80m review case with pt/ discussion/ counseling/ giving and going over instructions (see avs)

## 2015-05-18 NOTE — Telephone Encounter (Signed)
Result Note     Call pt: Reviewed cxr and no acute change so no change in recommendations made at ov  ---  I spoke with patient about results and he verbalized understanding and had no questions 

## 2015-06-28 ENCOUNTER — Ambulatory Visit (INDEPENDENT_AMBULATORY_CARE_PROVIDER_SITE_OTHER): Payer: Medicare Other | Admitting: Internal Medicine

## 2015-06-28 ENCOUNTER — Encounter: Payer: Self-pay | Admitting: Internal Medicine

## 2015-06-28 VITALS — BP 122/66 | HR 72 | Ht 68.5 in | Wt 326.0 lb

## 2015-06-28 DIAGNOSIS — J449 Chronic obstructive pulmonary disease, unspecified: Secondary | ICD-10-CM

## 2015-06-28 DIAGNOSIS — I10 Essential (primary) hypertension: Secondary | ICD-10-CM

## 2015-06-28 LAB — PULMONARY FUNCTION TEST
DL/VA % pred: 66 %
DL/VA: 2.99 ml/min/mmHg/L
DLCO unc % pred: 47 %
DLCO unc: 14.33 ml/min/mmHg
FEF 25-75 Post: 0.88 L/sec
FEF 25-75 Pre: 0.62 L/sec
FEF2575-%Change-Post: 42 %
FEF2575-%Pred-Post: 34 %
FEF2575-%Pred-Pre: 24 %
FEV1-%Change-Post: 13 %
FEV1-%Pred-Post: 51 %
FEV1-%Pred-Pre: 45 %
FEV1-Post: 1.67 L
FEV1-Pre: 1.47 L
FEV1FVC-%Change-Post: 1 %
FEV1FVC-%Pred-Pre: 72 %
FEV6-%Change-Post: 11 %
FEV6-%Pred-Post: 69 %
FEV6-%Pred-Pre: 63 %
FEV6-Post: 2.88 L
FEV6-Pre: 2.59 L
FEV6FVC-%Change-Post: -1 %
FEV6FVC-%Pred-Post: 100 %
FEV6FVC-%Pred-Pre: 101 %
FVC-%Change-Post: 12 %
FVC-%Pred-Post: 69 %
FVC-%Pred-Pre: 61 %
FVC-Post: 3.02 L
FVC-Pre: 2.69 L
Post FEV1/FVC ratio: 55 %
Post FEV6/FVC ratio: 95 %
Pre FEV1/FVC ratio: 55 %
Pre FEV6/FVC Ratio: 96 %
RV % pred: 123 %
RV: 2.81 L
TLC % pred: 87 %
TLC: 5.89 L

## 2015-06-28 MED ORDER — FUROSEMIDE 20 MG PO TABS: 20.0000 mg | ORAL_TABLET | Freq: Every day | ORAL | Status: DC | PRN

## 2015-06-28 MED ORDER — BUDESONIDE-FORMOTEROL FUMARATE 160-4.5 MCG/ACT IN AERO: INHALATION_SPRAY | RESPIRATORY_TRACT | Status: DC

## 2015-06-28 NOTE — Progress Notes (Signed)
PFT done today. 

## 2015-06-28 NOTE — Assessment & Plan Note (Addendum)
Off acei 05/17/15 due to cough Off hctz 06/28/2015 due to gout  Adequate control on present rx, reviewed > needs trial off hctz due to gout so try lasix 20 mg prn leg swelling > leave off acei indefinitely so as not to muddy the waters in terms of interpretation of non-specific airway symptoms

## 2015-06-28 NOTE — Assessment & Plan Note (Signed)
pfts 06/28/2015 with restrictive component with erv 41  Body mass index is 48.84 . - trending up  Lab Results  Component Value Date   TSH 2.020 01/28/2014     Contributing to gerd tendency/ doe/reviewed the need and the process to achieve and maintain neg calorie balance > defer f/u primary care including intermittently monitoring thyroid status

## 2015-06-28 NOTE — Progress Notes (Signed)
   Subjective:    Patient ID: Alfred Carney, male    DOB: 1951-03-06    MRN: [id number]    Brief patient profile:  64 yowm quit smoking 2008 with baseline weight of 240 when he stopped smoking in March of 2008 p pna with breathing only some better then  worse since summer 2015 on so referred 05/17/2015 to pulmonary clinic by Dr Helane Rima for copd eval as had been seen here in 2012 with GOLD II criteria.     History of Present Illness 05/17/2015 1st [facility] office visit/ [name]  / copd eval on ACEi  Chief Complaint  Patient presents with  . Pulmonary Consult    Referre by Dr. Helane Rima. Pt c/o SOB x 10 yrs, gradually worse over the past yr.  He states that he gets SOB with minimal exertion such as taking a shower or walking 49 yrds on flat surface.   indolent onset progressive doe but always ok at rest/  Sleeps in recliner x 10 years Has osa/ could not tol cpap and tends to fall asleep p eats Assoc with sense of nasal and throat congestion but very little cough - does have hb but controls with prn ppi Had been on several different inhalers including according to the records Spiriva but didn't think any of them really helped and stopped them years ago rec Stop lisinopril  Valsartan 80 mg one daily in place of lisinopril Prilosec Take 30- 60 min before your first and last meals of the day just until you return  GERD  Diet   06/28/2015  f/u ov/[facility] [facility] re: GOLD II copd/ obesity Chief Complaint  Patient presents with  . Follow-up    PFT done today. Pt states his breathing is unchanged. He c/o cough for the past month- prod with min clear sputum.      Body mass index is 48.84 kg/(m^2).     Was some better until caught cold x 2 weeks p serving jury duty Has not tried sleeping s recliner                     Objective:   Physical Exam  amb obese wm with gruff voice  06/28/2015        326     05/17/15 329 lb (149.233 kg)  01/19/15 322 lb (146.058 kg)  01/18/15  319 lb (144.697 kg)    Vital signs reviewed  HEENT: nl dentition, turbinates, and orophanx. Nl external ear canals without cough reflex   NECK :  without JVD/Nodes/TM/ nl carotid upstrokes bilaterally   LUNGS: no acc muscle use, clear to A and P bilaterally without cough on insp or exp maneuvers   CV:  RRR  no s3 or murmur or increase in P2, no edema   ABD:  soft and nontender with nl excursion in the supine position. No bruits or organomegaly, bowel sounds nl  MS:  warm without deformities, calf tenderness, cyanosis or clubbing  SKIN: warm and dry without lesions    NEURO:  alert, approp, no deficits    CXR PA and Lateral:   05/17/2015 :     I personally reviewed images and agree with radiology impression as follows:    Emphysema without acute disease.    Assessment & Plan:

## 2015-06-28 NOTE — Assessment & Plan Note (Signed)
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 - 05/17/2015  Walked RA  2 laps @ 185 ft each stopped due to  Sob/ desat at nl pace to 83%   - trial off acei 05/17/2015  - PFT's  06/28/2015  FEV1 1.67 (51 % ) ratio 55  p 13 % improvement from saba with DLCO  47 % corrects to 66 % for alv volume    He is discouraged but hasn't really given the meds a chance yet and did get significant improvement from saba  The proper method of use, as well as anticipated side effects, of a metered-dose inhaler are discussed and demonstrated to the patient. Improved effectiveness after extensive coaching during this visit to a level of approximately  75% so try symbicort 160 2bid  I had an extended discussion with the patient reviewing all relevant studies completed to date and  lasting 15 to 20 minutes of a 25 minute visit    Each maintenance medication was reviewed in detail including most importantly the difference between maintenance and prns and under what circumstances the prns are to be triggered using an action plan format that is not reflected in the computer generated alphabetically organized AVS.    Please see instructions for details which were reviewed in writing and the patient given a copy highlighting the part that I personally wrote and discussed at today's ov.

## 2015-06-28 NOTE — Patient Instructions (Addendum)
Stop hydrodiuril (thiazides contribute to gout)   Start lasix 20 mg daily as needed for swelling  - if you cut the sal out you may not need this   Symbicort 160 Take 2 puffs first thing in am and then another 2 puffs about 12 hours later.   Please schedule a follow up office visit in 6 weeks, call sooner if needed  Repeat walking sats next ov

## 2015-08-15 ENCOUNTER — Encounter: Payer: Self-pay | Admitting: Internal Medicine

## 2015-08-15 ENCOUNTER — Ambulatory Visit (INDEPENDENT_AMBULATORY_CARE_PROVIDER_SITE_OTHER): Payer: Medicare Other | Admitting: Internal Medicine

## 2015-08-15 VITALS — BP 136/82 | HR 83 | Ht 69.0 in | Wt 322.6 lb

## 2015-08-15 DIAGNOSIS — J449 Chronic obstructive pulmonary disease, unspecified: Secondary | ICD-10-CM | POA: Diagnosis not present

## 2015-08-15 DIAGNOSIS — I10 Essential (primary) hypertension: Secondary | ICD-10-CM

## 2015-08-15 NOTE — Patient Instructions (Signed)
Weight control is simply a matter of calorie balance which needs to be tilted in your favor by eating less and exercising more.  To get the most out of exercise, you need to be continuously aware that you are short of breath, but never out of breath, for 30 minutes daily. As you improve, it will actually be easier for you to do the same amount of exercise  in  30 minutes so always push to the level where you are short of breath.  If this does not result in gradual weight reduction then I strongly recommend you see a nutritionist with a food diary x 2 weeks so that we can work out a negative calorie balance which is universally effective in steady weight loss programs.  Think of your calorie balance like you do your bank account where in this case you want the balance to go down so you must take in less calories than you burn up.  It's just that simple:  Hard to do, but easy to understand.  Good luck!   You do not have significant limiting  copd and unlikely you ever will - pulmonary follow up is as needed

## 2015-08-15 NOTE — Assessment & Plan Note (Addendum)
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 - 05/17/2015  Walked RA  2 laps @ 185 ft each stopped due to  Sob/ desat at nl pace to 83%   - trial off acei 05/17/2015  - PFT's  06/28/2015  FEV1 1.67 (51 % ) ratio 55  p 13 % improvement from saba with DLCO  47 % corrects to 66 % for alv volume  - rec trial of symbicort 06/28/15 > coughing / breathing worse so stopped it  - 08/15/2015  Walked RA x 3 laps @ 185 ft each stopped due to End of study, nl pace, min sob/  desat  To 88%    Could rechallenge with LAMA (spiriva ) at some point but at this point hard to be convinced, given his level of VE demand, that he is ventilatory limited but rather most of his problem is obesity/ deconditioning.   I had an extended discussion with the patient and his wife  reviewing all relevant studies completed to date and  lasting 15 to 20 minutes of a 25 minute visit    As I explained to this patient in detail:  although there is copd present, it may not be clinically relevant:   it does not appear to be limiting activity tolerance any more than a set of worn tires limits someone from driving a car  around a parking lot.  A new set of Michelins(spiriva)  might look good but would have no perceived impact on the performance of the car and would not be worth the cost.  That is to say:   this pt is so sedentary I don't recommend aggressive pulmonary rx at this point unless limiting symptoms arise or acute exacerbations become as issue, neither of which is the case now.  I asked the patient to contact this office at any time in the future should either of these problems arise.    Each maintenance medication was reviewed in detail including most importantly the difference between maintenance and prns and under what circumstances the prns are to be triggered using an action plan format that is not reflected in the computer generated  alphabetically organized AVS.    Please see instructions for details which were reviewed in writing and the patient given a copy highlighting the part that I personally wrote and discussed at today's ov.

## 2015-08-15 NOTE — Assessment & Plan Note (Signed)
pfts 06/28/2015 with restrictive component with erv 41  Body mass index is 47.62 kg/(m^2).  Lab Results  Component Value Date   TSH 2.020 01/28/2014     Contributing to gerd tendency/ doe/reviewed the need and the process to achieve and maintain neg calorie balance > defer f/u primary care including intermittently monitoring thyroid status

## 2015-08-15 NOTE — Progress Notes (Signed)
Subjective:    Patient ID: Alfred Carney, male    DOB: [birth year]    MRN: [id number]    Brief patient profile:  64 yowm quit smoking 2008 with baseline weight of 240 when he stopped smoking in March of 2008 p pna with breathing only some better then  worse since summer 2015 on so referred 05/17/2015 to pulmonary clinic by Dr Helane Rima for copd eval as had been seen here in 2012 with GOLD II criteria.     History of Present Illness 05/17/2015 1st [facility] office visit/ Cambelle Suchecki  / copd eval on ACEi  Chief Complaint  Patient presents with  . Pulmonary Consult    Referre by Dr. Helane Rima. Pt c/o SOB x 10 yrs, gradually worse over the past yr.  He states that he gets SOB with minimal exertion such as taking a shower or walking 49 yrds on flat surface.   indolent onset progressive doe but always ok at rest/  Sleeps in recliner x 10 years Has osa/ could not tol cpap and tends to fall asleep p eats Assoc with sense of nasal and throat congestion but very little cough - does have hb but controls with prn ppi Had been on several different inhalers including according to the records Spiriva but didn't think any of them really helped and stopped them years ago rec Stop lisinopril  Valsartan 80 mg one daily in place of lisinopril Prilosec Take 30- 60 min before your first and last meals of the day just until you return  GERD  Diet     06/28/2015  f/u ov/Lavelle Berland re: GOLD II copd/ obesity Chief Complaint  Patient presents with  . Follow-up    PFT done today. Pt states his breathing is unchanged. He c/o cough for the past month- prod with min clear sputum.    Was some better until caught cold x 2 weeks p serving jury duty Has not tried sleeping s recliner  rec Stop hydrodiuril (thiazides contribute to gout)  Start lasix 20 mg daily as needed for swelling  - if you cut the sal out you may not need this  Symbicort 160 Take 2 puffs first thing in am and then another 2 puffs about 12  hours later.      08/15/2015  f/u ov/Zared Knoth re: uacs/ copd GOLD II/ symptoms better on ppi bid ac  Chief Complaint  Patient presents with  . Follow-up    pt following for COPD: pt states hes is doing pretty well since he has last been here. pt c/o some wheezing and SOB with exertion but states it is getting better. no c/o of cough or chest tightness.  pt states he stopped taking the symbicort he felt worse when he was on it.     Really Not limited by breathing from desired activities    No obvious day to day or daytime variability or assoc chronic cough or cp or chest tightness, subjective wheeze or overt sinus or hb symptoms. No unusual exp hx or h/o childhood pna/ asthma or knowledge of premature birth.  Sleeping ok without nocturnal  or early am exacerbation  of respiratory  c/o's or need for noct saba. Also denies any obvious fluctuation of symptoms with weather or environmental changes or other aggravating or alleviating factors except as outlined above   Current Medications, Allergies, Complete Past Medical History, Past Surgical History, Family History, and Social History were reviewed in [employer] record.  ROS  The  following are not active complaints unless bolded sore throat, dysphagia, dental problems, itching, sneezing,  nasal congestion or excess/ purulent secretions, ear ache,   fever, chills, sweats, unintended wt loss, classically pleuritic or exertional cp, hemoptysis,  orthopnea pnd or leg swelling, presyncope, palpitations, abdominal pain, anorexia, nausea, vomiting, diarrhea  or change in bowel or bladder habits, change in stools or urine, dysuria,hematuria,  rash, arthralgias(gout better off hctz) , visual complaints, headache, numbness, weakness or ataxia or problems with walking or coordination,  change in mood/affect or memory.            Objective:   Physical Exam  amb obese wm    06/28/2015        326 > 08/15/2015  323     05/17/15 329  lb (149.233 kg)  01/19/15 322 lb (146.058 kg)  01/18/15 319 lb (144.697 kg)    Vital signs reviewed  HEENT: nl dentition, turbinates, and orophanx. Nl external ear canals without cough reflex   NECK :  without JVD/Nodes/TM/ nl carotid upstrokes bilaterally   LUNGS: no acc muscle use, clear to A and P bilaterally without cough on insp or exp maneuvers   CV:  RRR  no s3 or murmur or increase in P2, no edema   ABD:  soft and nontender with nl excursion in the supine position. No bruits or organomegaly, bowel sounds nl  MS:  warm without deformities, calf tenderness, cyanosis or clubbing  SKIN: warm and dry without lesions    NEURO:  alert, approp, no deficits    CXR PA and Lateral:   05/17/2015 :     I personally reviewed images and agree with radiology impression as follows:    Emphysema without acute disease.    Assessment & Plan:

## 2015-08-15 NOTE — Assessment & Plan Note (Signed)
Off acei 05/17/15 due to cough Off hctz 06/28/2015 due to gout> improved   Adequate control on present rx, reviewed > no change in rx needed

## 2016-01-17 ENCOUNTER — Ambulatory Visit
Admission: RE | Admit: 2016-01-17 | Discharge: 2016-01-17 | Disposition: A | Discharge status: Home / Self Care | Payer: Medicare HMO | Source: Ambulatory Visit | Attending: Vascular Surgery | Admitting: Vascular Surgery

## 2016-01-17 DIAGNOSIS — Z48812 Encounter for surgical aftercare following surgery on the circulatory system: Secondary | ICD-10-CM

## 2016-01-17 DIAGNOSIS — I714 Abdominal aortic aneurysm, without rupture, unspecified: Secondary | ICD-10-CM

## 2016-01-17 MED ORDER — IOPAMIDOL (ISOVUE-370) INJECTION 76%
75.0000 mL | Freq: Once | INTRAVENOUS | Status: AC | PRN
  Administered 2016-01-17: 75 mL via INTRAVENOUS

## 2016-01-19 ENCOUNTER — Encounter: Payer: Self-pay | Admitting: Vascular Surgery

## 2016-01-24 ENCOUNTER — Encounter: Payer: Self-pay | Admitting: Vascular Surgery

## 2016-01-24 ENCOUNTER — Ambulatory Visit (INDEPENDENT_AMBULATORY_CARE_PROVIDER_SITE_OTHER): Payer: Medicare HMO | Admitting: Vascular Surgery

## 2016-01-24 VITALS — BP 129/74 | HR 74 | Ht 69.0 in | Wt 326.5 lb

## 2016-01-24 DIAGNOSIS — I716 Thoracoabdominal aortic aneurysm, without rupture, unspecified: Secondary | ICD-10-CM

## 2016-01-24 NOTE — Progress Notes (Signed)
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  HPI: Alfred Carney is a 65 y.o. 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 The patient was admitted in August 2015 with weakness and fever. A CT scan at that time showed an inflammatory process in the right lower quadrant with inflammation around the right limb of his graft but no abscess. I felt that this might be related to inflammation of the appendix however general surgery was consult back then and did not think this was the case. He was treated with IV vancomycin and Zosyn. Given that this was a type IV thoracoabdominal repair there were really no good options for removal of his graft. The graft is sewn to the perivisceral aorta with reimplantation of the renal arteries and there would be no way to oversew the native artery at this level nor would he be a candidate for a redo thoracoabdominal exposure and placement of a rifampin coated graft. This would not be technically possible. The only 2 options would potentially be removal of the right limb of the graft and a left to right fem-fem bypass graft, or removal of the aortic graft leaving a segment proximally to sew to and place a rifampin coated aortobiiliac graft. Given his obesity and medical comorbidities any of these options would be associated with significant risk.   When I last saw him on 07/13/2014, the inflammatory process adjacent to the right limb of the graft was resolved. He comes in for a 1 year follow up with CT scan. Since I saw him last he denies any fever or chills. He denies any abdominal pain. He denies claudication or rest pain.   Past Medical History  Diagnosis Date  . Morbid obesity ([place])   . Chronic obstructive pulmonary disease ([place]) 2008    Moderate  . Community acquired pneumonia   .  Hypertension   . Hyperlipidemia   . Degenerative joint disease   . Coronary artery disease   . Gastroesophageal reflux disease   . Thoracoabdominal aneurysm [facility])     status post vascular surgery repair  . 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 Hypokalemia     diuretic induced, resolved  . Shortness of breath   . Gout   . Colon polyps   . H/O hiatal hernia   . Enlarged liver     fatty liver by '09 CT  . Sleep apnea     not currently using CPAP 05/20/13  . Emphysema lung ([place])   . Cancer [facility])     skin cancer    Family History  Problem Relation Age of Onset  . Osteoarthritis Mother   . Diabetes Mother   . Heart disease Father     Coronary Artery Disease  . Multiple sclerosis Sister   . Factor V Leiden deficiency Sister   . Lung cancer Maternal Grandfather     smoked  . Emphysema Sister     smoked    SOCIAL HISTORY: Social History  Substance Use Topics  . Smoking status: Former Smoker -- 1.00 packs/day for 35 years    Types: Cigarettes    Quit date: 09/16/2006  . Smokeless tobacco: Never Used  . Alcohol Use: No    No Known Allergies  Current Outpatient  Prescriptions  Medication Sig Dispense Refill  . aspirin EC 81 MG tablet Take 81 mg by mouth daily.    . Cholecalciferol (VITAMIN D PO) Take 1 capsule by mouth 2 (two) times daily.    . furosemide (LASIX) 20 MG tablet Take 1 tablet (20 mg total) by mouth daily as needed. 30 tablet 11  . ibuprofen (ADVIL,MOTRIN) 200 MG tablet Take 200-400 mg by mouth every 6 (six) hours as needed for pain.    . metFORMIN (GLUCOPHAGE) 500 MG tablet Take 500 mg by mouth 2 (two) times daily with a meal.     . metoprolol succinate (TOPROL-XL) 25 MG 24 hr tablet Take 25 mg by mouth daily.    . nitroGLYCERIN (NITROSTAT) 0.4 MG SL tablet Place 1 tablet (0.4 mg total) under the tongue every 5 (five) minutes x 3 doses as needed for chest pain. 25 tablet 4  . omeprazole (PRILOSEC) 20  MG capsule Take 20 mg by mouth 2 (two) times daily before a meal.     . rosuvastatin (CRESTOR) 10 MG tablet Take 5 mg by mouth daily.    . valsartan (DIOVAN) 80 MG tablet One dialy 30 tablet 11  . cephALEXin (KEFLEX) 500 MG capsule Take 1 capsule (500 mg total) by mouth 2 (two) times daily. 60 capsule 6   No current facility-administered medications for this visit.    REVIEW OF SYSTEMS:  [X]  denotes positive finding, [ ]  denotes negative finding Cardiac  Comments:  Chest pain or chest pressure:    Shortness of breath upon exertion:    Short of breath when lying flat:    Irregular heart rhythm:        Vascular    Pain in calf, thigh, or hip brought on by ambulation:    Pain in feet at night that wakes you up from your sleep:     Blood clot in your veins:    Leg swelling:         Pulmonary    Oxygen at home:    Productive cough:     Wheezing:         Neurologic    Sudden weakness in arms or legs:     Sudden numbness in arms or legs:     Sudden onset of difficulty speaking or slurred speech:    Temporary loss of vision in one eye:     Problems with dizziness:         Gastrointestinal    Blood in stool:     Vomited blood:         Genitourinary    Burning when urinating:     Blood in urine:        Psychiatric    Major depression:         Hematologic    Bleeding problems:    Problems with blood clotting too easily:        Skin    Rashes or ulcers:        Constitutional    Fever or chills:      PHYSICAL EXAM: Filed Vitals:   01/24/16 1300  BP: 129/74  Pulse: 74  Height: 5\' 9"  (1.753 m)  Weight: 326 lb 8 oz (148.099 kg)  SpO2: 92%    GENERAL: The patient is a well-nourished male, in no acute distress. The vital signs are documented above. CARDIAC: There is a regular rate and rhythm.  VASCULAR: I do not detect carotid bruits. He has palpable femoral pulses. His moderate  bilateral lower extremity swelling and it is difficult to palpate his pedal  pulses. PULMONARY: There is good air exchange bilaterally without wheezing or rales. ABDOMEN: Soft and non-tender with normal pitched bowel sounds.  MUSCULOSKELETAL: There are no major deformities or cyanosis. NEUROLOGIC: No focal weakness or paresthesias are detected. SKIN: There are no ulcers or rashes noted. PSYCHIATRIC: The patient has a normal affect.  DATA:   CT ANGIOGRAM ABDOMEN/ PELVIS: This shows that his aortic graft repair is intact without evidence of infection or perigraft collection. This process has completely resolved.  MEDICAL ISSUES:  STATUS POST TYPE IV THORACOABDOMINAL ANEURYSM REPAIR: The patient is doing well status post repair of type IV thoracoabdominal aneurysm. He had some perigraft fluid back in 2015 which I'm convinced is related to his appendix. This resolved with intravenous antibiotics. His last 2 scans have been normal without evidence of infection and complete resolution of this process. This reason I think it is safe to stretch his follow up L to 2 years and I have ordered a CT angiogram in 2 years. He knows to call sooner if he has problems. He is not a smoker.   Deitra Mayo [facility] [place]: [phone]

## 2016-03-20 ENCOUNTER — Other Ambulatory Visit: Payer: Self-pay | Admitting: Internal Medicine

## 2016-04-15 ENCOUNTER — Other Ambulatory Visit: Payer: Self-pay | Admitting: *Deleted

## 2016-04-15 DIAGNOSIS — I714 Abdominal aortic aneurysm, without rupture, unspecified: Secondary | ICD-10-CM

## 2016-08-20 DIAGNOSIS — K219 Gastro-esophageal reflux disease without esophagitis: Secondary | ICD-10-CM | POA: Diagnosis not present

## 2016-08-20 DIAGNOSIS — E7800 Pure hypercholesterolemia, unspecified: Secondary | ICD-10-CM | POA: Diagnosis not present

## 2016-08-20 DIAGNOSIS — Z0000 Encounter for general adult medical examination without abnormal findings: Secondary | ICD-10-CM | POA: Diagnosis not present

## 2016-08-20 DIAGNOSIS — Z6841 Body Mass Index (BMI) 40.0 and over, adult: Secondary | ICD-10-CM | POA: Diagnosis not present

## 2016-08-20 DIAGNOSIS — E119 Type 2 diabetes mellitus without complications: Secondary | ICD-10-CM | POA: Diagnosis not present

## 2016-08-20 DIAGNOSIS — I10 Essential (primary) hypertension: Secondary | ICD-10-CM | POA: Diagnosis not present

## 2016-09-23 ENCOUNTER — Other Ambulatory Visit: Payer: Self-pay | Admitting: Internal Medicine

## 2016-11-12 DIAGNOSIS — E1169 Type 2 diabetes mellitus with other specified complication: Secondary | ICD-10-CM | POA: Diagnosis not present

## 2016-11-12 DIAGNOSIS — E785 Hyperlipidemia, unspecified: Secondary | ICD-10-CM | POA: Diagnosis not present

## 2016-11-12 DIAGNOSIS — I83018 Varicose veins of right lower extremity with ulcer other part of lower leg: Secondary | ICD-10-CM | POA: Diagnosis not present

## 2016-11-12 DIAGNOSIS — I10 Essential (primary) hypertension: Secondary | ICD-10-CM | POA: Diagnosis not present

## 2016-11-12 DIAGNOSIS — Z23 Encounter for immunization: Secondary | ICD-10-CM | POA: Diagnosis not present

## 2016-11-12 DIAGNOSIS — L97811 Non-pressure chronic ulcer of other part of right lower leg limited to breakdown of skin: Secondary | ICD-10-CM | POA: Diagnosis not present

## 2016-11-12 DIAGNOSIS — Z6841 Body Mass Index (BMI) 40.0 and over, adult: Secondary | ICD-10-CM | POA: Diagnosis not present

## 2016-12-25 DIAGNOSIS — Z0100 Encounter for examination of eyes and vision without abnormal findings: Secondary | ICD-10-CM | POA: Diagnosis not present

## 2016-12-25 DIAGNOSIS — H524 Presbyopia: Secondary | ICD-10-CM | POA: Diagnosis not present

## 2016-12-25 DIAGNOSIS — E119 Type 2 diabetes mellitus without complications: Secondary | ICD-10-CM | POA: Diagnosis not present

## 2017-01-29 ENCOUNTER — Ambulatory Visit: Payer: Medicare HMO | Admitting: Vascular Surgery

## 2017-02-11 DIAGNOSIS — E1169 Type 2 diabetes mellitus with other specified complication: Secondary | ICD-10-CM | POA: Diagnosis not present

## 2017-02-11 DIAGNOSIS — E785 Hyperlipidemia, unspecified: Secondary | ICD-10-CM | POA: Diagnosis not present

## 2017-02-11 DIAGNOSIS — I10 Essential (primary) hypertension: Secondary | ICD-10-CM | POA: Diagnosis not present

## 2017-03-12 DIAGNOSIS — I209 Angina pectoris, unspecified: Secondary | ICD-10-CM | POA: Insufficient documentation

## 2017-03-12 DIAGNOSIS — Z0000 Encounter for general adult medical examination without abnormal findings: Secondary | ICD-10-CM | POA: Diagnosis not present

## 2017-03-12 DIAGNOSIS — I10 Essential (primary) hypertension: Secondary | ICD-10-CM | POA: Diagnosis not present

## 2017-03-12 DIAGNOSIS — H9193 Unspecified hearing loss, bilateral: Secondary | ICD-10-CM | POA: Insufficient documentation

## 2017-03-12 DIAGNOSIS — R0602 Shortness of breath: Secondary | ICD-10-CM | POA: Diagnosis not present

## 2017-03-12 DIAGNOSIS — B353 Tinea pedis: Secondary | ICD-10-CM | POA: Diagnosis not present

## 2017-03-26 DIAGNOSIS — I10 Essential (primary) hypertension: Secondary | ICD-10-CM | POA: Diagnosis not present

## 2017-03-26 DIAGNOSIS — J449 Chronic obstructive pulmonary disease, unspecified: Secondary | ICD-10-CM | POA: Diagnosis not present

## 2017-03-26 DIAGNOSIS — R42 Dizziness and giddiness: Secondary | ICD-10-CM | POA: Diagnosis not present

## 2017-03-26 DIAGNOSIS — I209 Angina pectoris, unspecified: Secondary | ICD-10-CM | POA: Diagnosis not present

## 2017-06-17 DIAGNOSIS — E119 Type 2 diabetes mellitus without complications: Secondary | ICD-10-CM | POA: Diagnosis not present

## 2017-06-17 DIAGNOSIS — I10 Essential (primary) hypertension: Secondary | ICD-10-CM | POA: Diagnosis not present

## 2017-06-17 DIAGNOSIS — Z125 Encounter for screening for malignant neoplasm of prostate: Secondary | ICD-10-CM | POA: Diagnosis not present

## 2017-06-17 DIAGNOSIS — I209 Angina pectoris, unspecified: Secondary | ICD-10-CM | POA: Diagnosis not present

## 2017-06-17 DIAGNOSIS — Z6841 Body Mass Index (BMI) 40.0 and over, adult: Secondary | ICD-10-CM | POA: Diagnosis not present

## 2017-06-17 DIAGNOSIS — E785 Hyperlipidemia, unspecified: Secondary | ICD-10-CM | POA: Diagnosis not present

## 2017-08-04 DIAGNOSIS — I10 Essential (primary) hypertension: Secondary | ICD-10-CM | POA: Diagnosis not present

## 2017-08-04 DIAGNOSIS — M159 Polyosteoarthritis, unspecified: Secondary | ICD-10-CM | POA: Diagnosis not present

## 2017-08-04 DIAGNOSIS — R0989 Other specified symptoms and signs involving the circulatory and respiratory systems: Secondary | ICD-10-CM | POA: Diagnosis not present

## 2017-08-04 DIAGNOSIS — Z0000 Encounter for general adult medical examination without abnormal findings: Secondary | ICD-10-CM | POA: Diagnosis not present

## 2017-08-04 DIAGNOSIS — R0600 Dyspnea, unspecified: Secondary | ICD-10-CM | POA: Diagnosis not present

## 2017-08-04 DIAGNOSIS — E785 Hyperlipidemia, unspecified: Secondary | ICD-10-CM | POA: Diagnosis not present

## 2017-08-04 DIAGNOSIS — E119 Type 2 diabetes mellitus without complications: Secondary | ICD-10-CM | POA: Diagnosis not present

## 2017-08-04 DIAGNOSIS — L819 Disorder of pigmentation, unspecified: Secondary | ICD-10-CM | POA: Diagnosis not present

## 2017-08-04 DIAGNOSIS — K08109 Complete loss of teeth, unspecified cause, unspecified class: Secondary | ICD-10-CM | POA: Diagnosis not present

## 2017-09-18 DIAGNOSIS — I10 Essential (primary) hypertension: Secondary | ICD-10-CM | POA: Diagnosis not present

## 2017-09-18 DIAGNOSIS — E785 Hyperlipidemia, unspecified: Secondary | ICD-10-CM | POA: Diagnosis not present

## 2017-09-18 DIAGNOSIS — I209 Angina pectoris, unspecified: Secondary | ICD-10-CM | POA: Diagnosis not present

## 2017-09-18 DIAGNOSIS — E1169 Type 2 diabetes mellitus with other specified complication: Secondary | ICD-10-CM | POA: Diagnosis not present

## 2017-09-18 DIAGNOSIS — E1159 Type 2 diabetes mellitus with other circulatory complications: Secondary | ICD-10-CM | POA: Diagnosis not present

## 2017-09-18 DIAGNOSIS — J449 Chronic obstructive pulmonary disease, unspecified: Secondary | ICD-10-CM | POA: Diagnosis not present

## 2017-09-18 DIAGNOSIS — Z6841 Body Mass Index (BMI) 40.0 and over, adult: Secondary | ICD-10-CM | POA: Diagnosis not present

## 2017-09-25 DIAGNOSIS — N289 Disorder of kidney and ureter, unspecified: Secondary | ICD-10-CM | POA: Diagnosis not present

## 2017-11-19 ENCOUNTER — Ambulatory Visit: Payer: Medicare HMO | Admitting: Internal Medicine

## 2017-11-19 ENCOUNTER — Other Ambulatory Visit (INDEPENDENT_AMBULATORY_CARE_PROVIDER_SITE_OTHER): Payer: Medicare HMO

## 2017-11-19 ENCOUNTER — Ambulatory Visit (INDEPENDENT_AMBULATORY_CARE_PROVIDER_SITE_OTHER)
Admission: RE | Admit: 2017-11-19 | Discharge: 2017-11-19 | Disposition: A | Discharge status: Home / Self Care | Payer: Medicare HMO | Source: Ambulatory Visit | Attending: Internal Medicine | Admitting: Internal Medicine

## 2017-11-19 ENCOUNTER — Encounter: Payer: Self-pay | Admitting: Internal Medicine

## 2017-11-19 VITALS — BP 128/64 | HR 87 | Ht 69.0 in | Wt 327.0 lb

## 2017-11-19 DIAGNOSIS — J9611 Chronic respiratory failure with hypoxia: Secondary | ICD-10-CM | POA: Insufficient documentation

## 2017-11-19 DIAGNOSIS — R0609 Other forms of dyspnea: Secondary | ICD-10-CM

## 2017-11-19 DIAGNOSIS — D509 Iron deficiency anemia, unspecified: Secondary | ICD-10-CM | POA: Diagnosis not present

## 2017-11-19 DIAGNOSIS — R0600 Dyspnea, unspecified: Secondary | ICD-10-CM

## 2017-11-19 DIAGNOSIS — J449 Chronic obstructive pulmonary disease, unspecified: Secondary | ICD-10-CM

## 2017-11-19 DIAGNOSIS — R0602 Shortness of breath: Secondary | ICD-10-CM | POA: Diagnosis not present

## 2017-11-19 LAB — CBC WITH DIFFERENTIAL/PLATELET
Basophils Absolute: 0.1 10*3/uL (ref 0.0–0.1)
Basophils Relative: 1.1 % (ref 0.0–3.0)
Eosinophils Absolute: 0.1 10*3/uL (ref 0.0–0.7)
Eosinophils Relative: 0.9 % (ref 0.0–5.0)
HCT: 36.3 % — ABNORMAL LOW (ref 39.0–52.0)
Hemoglobin: 10.9 g/dL — ABNORMAL LOW (ref 13.0–17.0)
Lymphocytes Relative: 24.6 % (ref 12.0–46.0)
Lymphs Abs: 2.4 10*3/uL (ref 0.7–4.0)
MCHC: 30 g/dL (ref 30.0–36.0)
MCV: 66.5 fl — ABNORMAL LOW (ref 78.0–100.0)
Monocytes Absolute: 1 10*3/uL (ref 0.1–1.0)
Monocytes Relative: 9.8 % (ref 3.0–12.0)
Neutro Abs: 6.3 10*3/uL (ref 1.4–7.7)
Neutrophils Relative %: 63.6 % (ref 43.0–77.0)
Platelets: 319 10*3/uL (ref 150.0–400.0)
RBC: 5.46 Mil/uL (ref 4.22–5.81)
RDW: 20.9 % — ABNORMAL HIGH (ref 11.5–15.5)
WBC: 9.9 10*3/uL (ref 4.0–10.5)

## 2017-11-19 LAB — BASIC METABOLIC PANEL
BUN: 17 mg/dL (ref 6–23)
CO2: 30 mEq/L (ref 19–32)
Calcium: 9.5 mg/dL (ref 8.4–10.5)
Chloride: 103 mEq/L (ref 96–112)
Creatinine, Ser: 1.34 mg/dL (ref 0.40–1.50)
GFR: 56.35 mL/min — ABNORMAL LOW (ref >60.00)
Glucose, Bld: 124 mg/dL — ABNORMAL HIGH (ref 70–99)
Potassium: 4.2 mEq/L (ref 3.5–5.1)
Sodium: 143 mEq/L (ref 135–145)

## 2017-11-19 LAB — BRAIN NATRIURETIC PEPTIDE: Pro B Natriuretic peptide (BNP): 79 pg/mL (ref 0.0–100.0)

## 2017-11-19 LAB — TSH: TSH: 3.74 u[IU]/mL (ref 0.35–4.50)

## 2017-11-19 MED ORDER — TIOTROPIUM BROMIDE-OLODATEROL 2.5-2.5 MCG/ACT IN AERS: 2.0000 | INHALATION_SPRAY | Freq: Every day | RESPIRATORY_TRACT | 0 refills | Status: DC

## 2017-11-19 MED ORDER — TIOTROPIUM BROMIDE-OLODATEROL 2.5-2.5 MCG/ACT IN AERS: 2.0000 | INHALATION_SPRAY | Freq: Every day | RESPIRATORY_TRACT | 11 refills | Status: DC

## 2017-11-19 NOTE — Progress Notes (Signed)
Subjective:   Patient ID: Alfred Carney, male    DOB: November 30, 1950    MRN: [id number]    Brief patient profile:  28  yowm quit smoking 2008 with baseline weight of 240 when he stopped smoking in March of 2008 p pna with breathing only some better then  worse since summer 2015 on so referred 05/17/2015 to pulmonary clinic by Dr Helane Rima for copd eval as had been seen here in 2012 with GOLD II criteria.     History of Present Illness 05/17/2015 1st [facility] office visit/ Tamara Monteith  / copd eval on ACEi  Chief Complaint  Patient presents with  . Pulmonary Consult    Referre by Dr. Helane Rima. Pt c/o SOB x 10 yrs, gradually worse over the past yr.  He states that he gets SOB with minimal exertion such as taking a shower or walking 49 yrds on flat surface.   indolent onset progressive doe but always ok at rest/  Sleeps in recliner x 10 years Has osa/ could not tol cpap and tends to fall asleep p eats Assoc with sense of nasal and throat congestion but very little cough - does have hb but controls with prn ppi Had been on several different inhalers including according to the records Spiriva but didn't think any of them really helped and stopped them years ago rec Stop lisinopril  Valsartan 80 mg one daily in place of lisinopril Prilosec Take 30- 60 min before your first and last meals of the day just until you return  GERD  Diet     06/28/2015  f/u ov/Kasheem Toner re: GOLD II copd/ obesity Chief Complaint  Patient presents with  . Follow-up    PFT done today. Pt states his breathing is unchanged. He c/o cough for the past month- prod with min clear sputum.    Was some better until caught cold x 2 weeks p serving jury duty Has not tried sleeping s recliner  rec Stop hydrodiuril (thiazides contribute to gout)  Start lasix 20 mg daily as needed for swelling  - if you cut the sal out you may not need this  Symbicort 160 Take 2 puffs first thing in am and then another 2 puffs about 12  hours later.      08/15/2015  f/u ov/Natash Berman re: uacs/ copd GOLD II/ symptoms better on ppi bid ac  Chief Complaint  Patient presents with  . Follow-up    pt following for COPD: pt states hes is doing pretty well since he has last been here. pt c/o some wheezing and SOB with exertion but states it is getting better. no c/o of cough or chest tightness.  pt states he stopped taking the symbicort he felt worse when he was on it.    Really Not limited by breathing from desired activities   rec Weight control is simply a matter of calorie balance   Think of your calorie balance like you do your bank account where in this case you want the balance to go down so you must take in less calories than you burn up.  It's just that simple:  Hard to do, but easy to understand.  Good luck!  You do not have significant limiting  copd and unlikely you ever will - pulmonary follow up is as needed     11/19/2017  Extended f/u ov/Rosalind Guido re-establish for  worse sob x 3 y  Chief Complaint  Patient presents with  . Follow-up  SOB that has not gotten any better since last OV .  Has a CPAP  but unable to tolerate .   indolent gradually worsening Dyspnea: 50 ft to car down 6 steps from the deck and struggles to back x 3-4 months Cough: none Sleep: poor/ restlessness/ recliner but barely elevated / has cpap but can't tol at all  SABA use:  Not using one now  Not using prilosec regularly   No obvious day to day or daytime variability or assoc excess/ purulent sputum or mucus plugs or hemoptysis or cp or chest tightness, subjective wheeze or overt sinus or hb symptoms. No unusual exposure hx or h/o childhood pna/ asthma or knowledge of premature birth.  Sleeping in recliner  without nocturnal  or early am exacerbation  of respiratory  c/o's or need for noct saba. Also denies any obvious fluctuation of symptoms with weather or environmental changes or other aggravating or alleviating factors except as outlined above    Current Allergies, Complete Past Medical History, Past Surgical History, Family History, and Social History were reviewed in [employer] record.  ROS  The following are not active complaints unless bolded Hoarseness, sore throat, dysphagia, dental problems, itching, sneezing,  nasal congestion or discharge of excess mucus or purulent secretions, ear ache,   fever, chills, sweats, unintended wt loss or wt gain, classically pleuritic or exertional cp,  orthopnea pnd or leg swelling, presyncope, palpitations, abdominal pain, anorexia, nausea, vomiting, diarrhea  or change in bowel habits or change in bladder habits, change in stools or change in urine, dysuria, hematuria,  rash, arthralgias, visual complaints, headache, numbness, weakness or ataxia or problems with walking or coordination,  change in mood/affect or memory.        Current Meds  Medication Sig  . aspirin EC 81 MG tablet Take 81 mg by mouth daily.  . Cholecalciferol (VITAMIN D PO) Take 1 capsule by mouth 2 (two) times daily.  . furosemide (LASIX) 20 MG tablet Take 1 tablet (20 mg total) by mouth daily as needed.  Marland Kitchen ibuprofen (ADVIL,MOTRIN) 200 MG tablet Take 200-400 mg by mouth every 6 (six) hours as needed for pain.  . metFORMIN (GLUCOPHAGE) 500 MG tablet Take 500 mg by mouth 2 (two) times daily with a meal.   . metoprolol succinate (TOPROL-XL) 25 MG 24 hr tablet Take 25 mg by mouth daily.  . nitroGLYCERIN (NITROSTAT) 0.4 MG SL tablet Place 1 tablet (0.4 mg total) under the tongue every 5 (five) minutes x 3 doses as needed for chest pain.  Marland Kitchen omeprazole (PRILOSEC) 20 MG capsule Take 20 mg by mouth 2 (two) times daily before a meal.   . rosuvastatin (CRESTOR) 10 MG tablet Take 5 mg by mouth daily.  . valsartan (DIOVAN) 80 MG tablet One dialy              Objective:   Physical Exam  amb obese wm    06/28/2015        326 > 08/15/2015  323 >  11/19/2017  326     05/17/15 329 lb (149.233 kg)  01/19/15  322 lb (146.058 kg)  01/18/15 319 lb (144.697 kg)    Vital signs reviewed - Note on arrival 02 sats  81% on RA   And 91% on 2lpm         HEENT: nl   turbinates bilaterally, and oropharynx. Nl external ear canals without cough reflex - full dentures   NECK :  without JVD/Nodes/TM/ nl  carotid upstrokes bilaterally   LUNGS: no acc muscle use,  Nl contour chest with minimal insp and exp rhonchi s localized or gen wheeze or cough on insp or exp   CV:  RRR  no s3 or murmur or increase in P2, and  Trace bilateral lower ext sym pittingedema   ABD:  Quite obese nontender with very poor  inspiratory excursion in the supine position. No bruits or organomegaly appreciated, bowel sounds nl  MS:  Nl gait/ ext warm without deformities, calf tenderness, cyanosis or clubbing No obvious joint restrictions   SKIN: warm and dry without lesions    NEURO:  alert, approp, nl sensorium with  no motor or cerebellar deficits apparent.      CXR PA and Lateral:   11/19/2017 :    I personally reviewed images and   impression as follows:    atx at L base vs scarring with ? HH vs eventration of HD    Labs ordered/ reviewed:      Chemistry      Component Value Date/Time   NA 143 11/19/2017 1556   K 4.2 11/19/2017 1556   CL 103 11/19/2017 1556   CO2 30 11/19/2017 1556   BUN 17 11/19/2017 1556   CREATININE 1.34 11/19/2017 1556   CREATININE 1.16 01/13/2015 0936      Component Value Date/Time   CALCIUM 9.5 11/19/2017 1556   ALKPHOS 81 07/19/2014 1156   AST 19 07/19/2014 1156   ALT 16 07/19/2014 1156   BILITOT 0.4 07/19/2014 1156        Lab Results  Component Value Date   WBC 9.9 11/19/2017   HGB 10.9 (L) 11/19/2017   HCT 36.3 (L) 11/19/2017   MCV 66.5 Repeated and verified X2. (L) 11/19/2017   PLT 319.0 11/19/2017         Lab Results  Component Value Date   TSH 3.74 11/19/2017     Lab Results  Component Value Date   PROBNP 79.0 11/19/2017            Assessment & Plan:

## 2017-11-19 NOTE — Patient Instructions (Addendum)
stiolto 2 pffs each am   Wear 02 2lpm 24/7 for now  Please remember to go to the lab and x-ray department downstairs in the basement  for your tests - we will call you with the results when they are available.    Please schedule a follow up office visit in 6 weeks, call sooner if needed with all medications /inhalers/ solutions in hand so we can verify exactly what you are taking. This includes all medications from all doctors and over the counters  - full pfts on return

## 2017-11-20 ENCOUNTER — Encounter: Payer: Self-pay | Admitting: Internal Medicine

## 2017-11-20 ENCOUNTER — Other Ambulatory Visit: Payer: Medicare HMO

## 2017-11-20 ENCOUNTER — Other Ambulatory Visit: Payer: Self-pay | Admitting: Internal Medicine

## 2017-11-20 ENCOUNTER — Encounter: Payer: Self-pay | Admitting: Gastroenterology

## 2017-11-20 DIAGNOSIS — J449 Chronic obstructive pulmonary disease, unspecified: Secondary | ICD-10-CM | POA: Diagnosis not present

## 2017-11-20 DIAGNOSIS — D509 Iron deficiency anemia, unspecified: Secondary | ICD-10-CM

## 2017-11-20 DIAGNOSIS — R0602 Shortness of breath: Secondary | ICD-10-CM | POA: Diagnosis not present

## 2017-11-20 NOTE — Progress Notes (Signed)
Spoke with pt and notified of results per Dr. Wert. Pt verbalized understanding and denied any questions. 

## 2017-11-20 NOTE — Assessment & Plan Note (Signed)
Multifactorial but the immediate problems are his copd and anemia combining to cause progressive decrease in ex tol   Anemia does not usually cause desaturation but in setting of poor v/q matching in bases any drop in venous admixture (due to the higher 02 extraction from tissues in setting of poor delivery due to anemia) is certainly amplified  No evidence of cardiac or thyroid issues contributing based on today's studies.

## 2017-11-20 NOTE — Assessment & Plan Note (Signed)
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 - 05/17/2015  Walked RA  2 laps @ 185 ft each stopped due to  Sob/ desat at nl pace to 83%   - trial off acei 05/17/2015  - PFT's  06/28/2015  FEV1 1.67 (51 % ) ratio 55  p 13 % improvement from saba with DLCO  47 % corrects to 66 % for alv volume  - rec trial of symbicort 06/28/15 > coughing / breathing worse so stopped it   - 08/15/2015  Walked RA x 3 laps @ 185 ft each stopped due to End of study, nl pace, min sob/  desat  To 88%   - sats 81% at rest and not acutely ill 11/19/2017 > placed on 2lpm (see separate a/p)  - Spirometry 11/19/2017  FEV1 1.60 (49%)  Ratio 59 with classic curvature - 11/19/2017  After extensive coaching inhaler device  effectiveness =    90% with smi > trial of stiolto     This problem is longstanding and probably the reason he's having more sob now is related to anemia and deconditioning but for now will benefit from adequate 02 and max copd rx  Pt is Group B in terms of symptom/risk and laba/lama therefore appropriate rx at this point.   Try stiolto 2 bid   Reviewed Formulary restrictions will be an ongoing challenge for the forseable future and I would be happy to pick an alternative if the pt will first  provide me a list of them but pt  will need to return here for training for any new device that is required eg dpi vs hfa vs respimat.    In meantime we can always provide samples so the patient never runs out of any needed respiratory medications.    F/u with full pfts in 4-6 weeks

## 2017-11-20 NOTE — Assessment & Plan Note (Signed)
11/19/2017  sats 81% at rest in a chronic stable state so rec 2lpm 24/7   Hopefully with rx can expect to improve to not need at rest but based on previous eval will likely need longterm hs and with ex

## 2017-11-20 NOTE — Progress Notes (Signed)
LMTCB x1 on preferred phone number listed for patient.  

## 2017-11-20 NOTE — Assessment & Plan Note (Addendum)
  Lab Results  Component Value Date   HGB 10.9 (L) 11/19/2017   HGB 14.1 07/19/2014   HGB 11.5 (L) 05/10/2014     Will need fe studies/ gi w/u either thru pcp or our office as probably contributing to sob   I had an extended discussion with the patient reviewing all relevant studies completed to date and  lasting 25 minutes of a 40  minute office visit to re establish     re  severe non-specific but potentially very serious refractory respiratory symptoms of uncertain and potentially multiple  etiologies.   Each maintenance medication was reviewed in detail including most importantly the difference between maintenance and prns and under what circumstances the prns are to be triggered using an action plan format that is not reflected in the computer generated alphabetically organized AVS.    Please see AVS for specific instructions unique to this office visit that I personally wrote and verbalized to the the pt in detail and then reviewed with pt  by my nurse highlighting any changes in therapy/plan of care  recommended at today's visit.

## 2017-11-21 LAB — IRON,TIBC AND FERRITIN PANEL
%SAT: 5 % (calc) — ABNORMAL LOW (ref 15–60)
Ferritin: 9 ng/mL — ABNORMAL LOW (ref 20–380)
Iron: 24 ug/dL — ABNORMAL LOW (ref 50–180)
TIBC: 522 mcg/dL (calc) — ABNORMAL HIGH (ref 250–425)

## 2017-11-21 NOTE — Progress Notes (Signed)
Spoke with pt and notified of results per Dr. Wert. Pt verbalized understanding and denied any questions. 

## 2017-12-18 DIAGNOSIS — D509 Iron deficiency anemia, unspecified: Secondary | ICD-10-CM | POA: Diagnosis not present

## 2017-12-18 DIAGNOSIS — I10 Essential (primary) hypertension: Secondary | ICD-10-CM | POA: Diagnosis not present

## 2017-12-18 DIAGNOSIS — E785 Hyperlipidemia, unspecified: Secondary | ICD-10-CM | POA: Diagnosis not present

## 2017-12-18 DIAGNOSIS — I209 Angina pectoris, unspecified: Secondary | ICD-10-CM | POA: Diagnosis not present

## 2017-12-18 DIAGNOSIS — E1159 Type 2 diabetes mellitus with other circulatory complications: Secondary | ICD-10-CM | POA: Diagnosis not present

## 2017-12-18 DIAGNOSIS — E1169 Type 2 diabetes mellitus with other specified complication: Secondary | ICD-10-CM | POA: Diagnosis not present

## 2017-12-21 DIAGNOSIS — R0602 Shortness of breath: Secondary | ICD-10-CM | POA: Diagnosis not present

## 2017-12-21 DIAGNOSIS — J449 Chronic obstructive pulmonary disease, unspecified: Secondary | ICD-10-CM | POA: Diagnosis not present

## 2017-12-30 ENCOUNTER — Ambulatory Visit (INDEPENDENT_AMBULATORY_CARE_PROVIDER_SITE_OTHER): Payer: Medicare HMO | Admitting: Family

## 2017-12-30 ENCOUNTER — Other Ambulatory Visit: Payer: Self-pay

## 2017-12-30 ENCOUNTER — Encounter: Payer: Self-pay | Admitting: Family

## 2017-12-30 VITALS — BP 148/67 | HR 69 | Temp 98.5°F | Resp 22 | Ht 69.0 in | Wt 323.0 lb

## 2017-12-30 DIAGNOSIS — I716 Thoracoabdominal aortic aneurysm, without rupture, unspecified: Secondary | ICD-10-CM

## 2017-12-30 DIAGNOSIS — Z9889 Other specified postprocedural states: Secondary | ICD-10-CM | POA: Diagnosis not present

## 2017-12-30 DIAGNOSIS — I714 Abdominal aortic aneurysm, without rupture, unspecified: Secondary | ICD-10-CM

## 2017-12-30 DIAGNOSIS — Z8679 Personal history of other diseases of the circulatory system: Secondary | ICD-10-CM | POA: Diagnosis not present

## 2017-12-30 DIAGNOSIS — I713 Abdominal aortic aneurysm, ruptured, unspecified: Secondary | ICD-10-CM

## 2017-12-30 NOTE — Progress Notes (Signed)
[facility] [place]   CC: Follow up after repair of type IV thoracoabdominal aneurysm.   History of Present Illness  Alfred Carney is a 67 y.o. 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 The patient was admitted in August 2015 with weakness and fever. A CT scan at that time showed an inflammatory process in the right lower quadrant with inflammation around the right limb of his graft but no abscess. Dr. Scot Dock felt that this might be related to inflammation of the appendix however general surgery was consult back then and did not think this was the case. He was treated with IV vancomycin and Zosyn. Given that this was a type IV thoracoabdominal repair there were really no good options for removal of his graft. The graft is sewn to the perivisceral aorta with reimplantation of the renal arteries and there would be no way to oversew the native artery at this level nor would he be a candidate for a redo thoracoabdominal exposure and placement of a rifampin coated graft. This would not be technically possible. The only 2 options would potentially be removal of the right limb of the graft and a left to right fem-fem bypass graft, or removal of the aortic graft leaving a segment proximally to sew to and place a rifampin coated aortobiiliac graft. Given his obesity and medical comorbidities any of these options would be associated with significant risk.   When Dr. Scot Dock saw him on 07/13/2014, the inflammatory process adjacent to the right limb of the graft had resolved. He came in for a 1 year follow up with CT scan.  He denies any fever or chills. He denies any abdominal pain. He denies claudication or rest pain.  Dr. Scot Dock last evaluated pt on 01-24-16. At that time CTA abd/pelvis showed that his aortic graft repair was intact without evidence of infection or perigraft collection. This process has completely resolved. The patient  was doing well status post repair of type IV thoracoabdominal aneurysm. He had some perigraft fluid back in 2015 which Dr. Scot Dock is convinced was related to his appendix. This resolved with intravenous antibiotics. His last 2 scans have been normal without evidence of infection and complete resolution of this process. For this reason Dr. Scot Dock thought it safe to stretch his follow up  to 2 years and ordered a CT angiogram in 2 years. Pt was to call sooner if he has problems.   He finished IV antbx via PICC line about a month after he was discharged from the August 2015 hospitalization.  Pt states he was started on supplemental O2 since it was found that his SAO2 decreased to 44%, sees Dr. Melvyn Novas, pulmonologist.    Pt states he feels worse since he started the supplemental O2 and since he started iron po. Pt states his balance and dizziness is worsening; states he told his PCP this. Pt states he was found to be anemic, is taking iron po.   He denies chest pain, denies abdominal pain, denies back pain.   Pt returns for 2 year CT follow up, needs a medical provider exam prior to insurance covering CT.   The patient states his walking is limited by dyspnea.  The patient denies any known  history of stroke or TIA symptoms.  Diabetic: Yes, states his last A1C was 6.4 Tobacco use: former smoker, quit in 2008, smoked x 35 years  Past Medical History:  Diagnosis Date  .  Cancer ([place])    skin cancer  . Chronic obstructive pulmonary disease ([place]) 2008   Moderate  . Colon polyps   . Community acquired pneumonia   . Coronary artery disease   . Degenerative joint disease   . Emphysema lung ([place])   . Enlarged liver    fatty liver by '09 CT  . Gastroesophageal reflux disease   . Gout   . H/O hiatal hernia   . History of Rocky Mountain spotted fever    Possible history of Rocky Mountain Spotted Fever  . Hyperlipidemia   . Hypertension   . Hypokalemia    diuretic induced, resolved  . Morbid  obesity ([place])   . Shortness of breath   . Sleep apnea    not currently using CPAP 05/20/13  . 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[place])    status post vascular surgery repair   Past Surgical History:  Procedure Laterality Date  . CARDIAC CATHETERIZATION  2007   Ejection fraction is estimated at 60%  . CHOLECYSTECTOMY    . HARDWARE REMOVAL Left 05/26/2013   Procedure: HARDWARE REMOVAL;  Surgeon: Newt Minion, MD;  Location: [facility];  Service: Orthopedics;  Laterality: Left;  Left Total Hip Arthroplasty, Removal of Deep Hardware  . HIP SURGERY     Status post left hip surgery with bone grafting  . LEFT HEART CATHETERIZATION WITH CORONARY ANGIOGRAM N/A 01/31/2014   Procedure: LEFT HEART CATHETERIZATION WITH CORONARY ANGIOGRAM;  Surgeon: Burnell Blanks, MD;  Location: [facility] CATH LAB;  Service: Cardiovascular;  Laterality: N/A;  . THORACOABDOMINAL AORTIC ANEURYSM REPAIR     with right femoral and left iliac BPG and reimplantation of renal arteries.  . TONSILLECTOMY    . TOTAL HIP ARTHROPLASTY Left 05/26/2013   Procedure: TOTAL HIP ARTHROPLASTY;  Surgeon: Newt Minion, MD;  Location: [facility];  Service: Orthopedics;  Laterality: Left;  Left Total Hip Arthroplasty, Removal Deep Hardware   Social History Social History   Socioeconomic History  . Marital status: Married    Spouse name: Not on file  . Number of children: 4  . Years of education: Not on file  . Highest education level: Not on file  Occupational History  . Occupation: architectural drawing-retired  Social Needs  . Financial resource strain: Not on file  . Food insecurity:    Worry: Not on file    Inability: Not on file  . Transportation needs:    Medical: Not on file    Non-medical: Not on file  Tobacco Use  . Smoking status: Former Smoker    Packs/day: 1.00    Years: 35.00    Pack years: 35.00    Types: Cigarettes    Last attempt to quit: 09/16/2006    Years since quitting: 11.2  . Smokeless tobacco: Never Used  Substance  and Sexual Activity  . Alcohol use: No    Alcohol/week: 0.0 oz  . Drug use: No  . Sexual activity: Not on file  Lifestyle  . Physical activity:    Days per week: Not on file    Minutes per session: Not on file  . Stress: Not on file  Relationships  . Social connections:    Talks on phone: Not on file    Gets together: Not on file    Attends religious service: Not on file    Active member of club or organization: Not on file    Attends meetings of clubs or organizations: Not on file    Relationship status: Not  on file  . Intimate partner violence:    Fear of current or ex partner: Not on file    Emotionally abused: Not on file    Physically abused: Not on file    Forced sexual activity: Not on file  Other Topics Concern  . Not on file  Social History Narrative  . Not on file   Family History Family History  Problem Relation Age of Onset  . Osteoarthritis Mother   . Diabetes Mother   . Heart disease Father        Coronary Artery Disease  . Multiple sclerosis Sister   . Factor V Leiden deficiency Sister   . Lung cancer Maternal Grandfather        smoked  . Emphysema Sister        smoked    Current Outpatient Medications on File Prior to Visit  Medication Sig Dispense Refill  . aspirin EC 81 MG tablet Take 81 mg by mouth daily.    . Cholecalciferol (VITAMIN D PO) Take 1 capsule by mouth 2 (two) times daily.    . furosemide (LASIX) 20 MG tablet Take 1 tablet (20 mg total) by mouth daily as needed. 30 tablet 11  . ibuprofen (ADVIL,MOTRIN) 200 MG tablet Take 200-400 mg by mouth every 6 (six) hours as needed for pain.    . metFORMIN (GLUCOPHAGE) 500 MG tablet Take 500 mg by mouth 2 (two) times daily with a meal.     . metoprolol succinate (TOPROL-XL) 25 MG 24 hr tablet Take 25 mg by mouth daily.    . nitroGLYCERIN (NITROSTAT) 0.4 MG SL tablet Place 1 tablet (0.4 mg total) under the tongue every 5 (five) minutes x 3 doses as needed for chest pain. 25 tablet 4  . omeprazole  (PRILOSEC) 20 MG capsule Take 20 mg by mouth 2 (two) times daily before a meal.     . rosuvastatin (CRESTOR) 10 MG tablet Take 5 mg by mouth daily.    . Tiotropium Bromide-Olodaterol (STIOLTO RESPIMAT) 2.5-2.5 MCG/ACT AERS Inhale 2 puffs into the lungs daily. 1 Inhaler 11   No current facility-administered medications on file prior to visit.    No Known Allergies  ROS: See HPI for pertinent positives and negatives.  Physical Examination  Vitals:   12/30/17 1314  BP: (!) 148/67  Pulse: 69  Resp: (!) 22  Temp: 98.5 F (36.9 C)  TempSrc: Oral  SpO2: (!) 89%  Weight: (!) 323 lb (146.5 kg)  Height: 5\' 9"  (1.753 m)   Body mass index is 47.7 kg/m.  General: A&O x 3, WD, morbidly obese male. HEENT: Grossly intact and WNL.  Pulmonary: Sym exp, respirations are somewhat labored at rest, limited air movement in all fields, distant breath sounds,  no rales, rhonchi, or wheezing. Cardiac: Regular rhythm and rate, no detected murmur.  Carotid Bruits Right Left   Negative Negative   Adominal aortic pulse is not palpable Radial pulses are 2+ palpable                          VASCULAR EXAM:  LE Pulses Right Left       FEMORAL  not palpable (morbidly obese)  not palpable        POPLITEAL  not palpable   not palpable       POSTERIOR TIBIAL  not palpable   not palpable        DORSALIS PEDIS      ANTERIOR TIBIAL 2+ palpable  2+ palpable     Gastrointestinal: soft, NTND, -G/R, - HSM, - masses palpated, - CVAT B, large panus. Musculoskeletal: M/S 5/5 throughout, Extremities without ischemic changes. 2+ pitting and non pitting edema in both ankles and feet.  Skin: No rashes, no ulcers, no cellulitis.   Neurologic: CN 2-12 intact except has some hearing loss, Pain and light touch intact in extremities are intact, Motor exam as listed above. Psychiatric: Normal thought content, mood  appropriate to clinical situation.    DATA Non today  Medical Decision Making  The patient is a 67 y.o. 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 He had some perigraft fluid back in 2015 which Dr. Scot Dock is convinced was related to his appendix. This resolved with intravenous antibiotics. His last 2 scans have been normal without evidence of infection and complete resolution of this process. For this reason Dr. Scot Dock thought it safe to stretch his follow up  to 2 years and ordered a CT angiogram in 2 years.   Based on this patient's exam and diagnostic studies, the patient will be scheduled for CTA abd/pelvis, see Dr. Scot Dock afterward, if not already scheduled for next month.    I emphasized the importance of maximal medical management including strict control of blood pressure, blood glucose, and lipid levels, antiplatelet agents, obtaining regular exercise, and continued cessation of smoking.    Thank you for allowing [name] to participate in this patient's care.  Clemon Chambers, RN, MSN, FNP-C [facility] Office: [phone]  Clinic Physician: Scot Dock on call  12/30/2017, 1:35 PM

## 2018-01-04 NOTE — Progress Notes (Signed)
Alfred Carney    [id number]    Nov 30, 1950  Primary Care Physician:Howell, Bryn Gulling, MD  Referring Physician: Helane Rima, MD [address] [place], [place] [id number]  Chief complaint: Iron deficiency anemia  HPI:  67 year old male with obesity, COPD, OSA, multiple comorbidities was referred here for evaluation of iron deficiency anemia.  He is having worsening shortness of breath, somewhat improved since he started wearing continuous oxygen. Denies any overt melena or blood per rectum.  His stool is dark since he started taking oral iron tablets. Any nausea, vomiting, dysphagia, abdominal pain or change in bowel habits.  No loss of appetite or weight loss. Last colonoscopy in 2007 by Dr. Carol Ada per patient, he thinks he may have 1 or 2 small polyps removed at that time.  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  Reviewed labs from March 2019 Hemoglobin 10.9, hematocrit 36, creatinine 1.34 Iron 24, percentage saturation 5, ferritin 9 and TIBC 522   Outpatient Encounter Medications as of 01/05/2018  Medication Sig  . aspirin EC 81 MG tablet Take 81 mg by mouth daily.  . Cholecalciferol (VITAMIN D PO) Take 1 capsule by mouth 2 (two) times daily.  . furosemide (LASIX) 20 MG tablet Take 1 tablet (20 mg total) by mouth daily as needed.  Marland Kitchen ibuprofen (ADVIL,MOTRIN) 200 MG tablet Take 200-400 mg by mouth every 6 (six) hours as needed for pain.  . metFORMIN (GLUCOPHAGE) 500 MG tablet Take 500 mg by mouth 2 (two) times daily with a meal.   . metoprolol succinate (TOPROL-XL) 25 MG 24 hr tablet Take 25 mg by mouth daily.  . nitroGLYCERIN (NITROSTAT) 0.4 MG SL tablet Place 1 tablet (0.4 mg total) under the tongue every 5 (five) minutes x 3 doses as needed for chest  pain.  Marland Kitchen omeprazole (PRILOSEC) 20 MG capsule Take 20 mg by mouth 2 (two) times daily before a meal.   . rosuvastatin (CRESTOR) 10 MG tablet Take 5 mg by mouth daily.  . Tiotropium Bromide-Olodaterol (STIOLTO RESPIMAT) 2.5-2.5 MCG/ACT AERS Inhale 2 puffs into the lungs daily.   No facility-administered encounter medications on file as of 01/05/2018.     Allergies as of 01/05/2018  . (No Known Allergies)    Past Medical History:  Diagnosis Date  . Cancer ([place])    skin cancer  . Chronic obstructive pulmonary disease ([place]) 2008   Moderate  . Colon polyps   . Community acquired pneumonia   . Coronary artery disease   . Degenerative joint disease   . Emphysema lung ([place])   . Enlarged liver    fatty liver by '09 CT  . Gastroesophageal reflux disease   . Gout   . H/O hiatal hernia   . History of Rocky Mountain spotted fever    Possible history of Rocky Mountain Spotted Fever  . Hyperlipidemia   . Hypertension   . Hypokalemia    diuretic induced, resolved  . Morbid obesity ([place])   . Shortness of breath   . Sleep apnea    not currently using CPAP 05/20/13  . 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[place])    status post vascular surgery repair    Past Surgical History:  Procedure Laterality Date  . CARDIAC CATHETERIZATION  2007  Ejection fraction is estimated at 60%  . CHOLECYSTECTOMY    . HARDWARE REMOVAL Left 05/26/2013   Procedure: HARDWARE REMOVAL;  Surgeon: Newt Minion, MD;  Location: [facility];  Service: Orthopedics;  Laterality: Left;  Left Total Hip Arthroplasty, Removal of Deep Hardware  . HIP SURGERY     Status post left hip surgery with bone grafting  . LEFT HEART CATHETERIZATION WITH CORONARY ANGIOGRAM N/A 01/31/2014   Procedure: LEFT HEART CATHETERIZATION WITH CORONARY ANGIOGRAM;  Surgeon: Burnell Blanks, MD;  [facility] C[facility] LAB;  Service: Cardiovascular;  Laterality: N/A;  . THORACOABDOMINAL AORTIC ANEURYSM REPAIR     with right femoral and left iliac BPG and  reimplantation of renal arteries.  . TONSILLECTOMY    . TOTAL HIP ARTHROPLASTY Left 05/26/2013   Procedure: TOTAL HIP ARTHROPLASTY;  Surgeon: Newt Minion, MD;  Location: [facility];  Service: Orthopedics;  Laterality: Left;  Left Total Hip Arthroplasty, Removal Deep Hardware    Family History  Problem Relation Age of Onset  . Osteoarthritis Mother   . Diabetes Mother   . Heart disease Father        Coronary Artery Disease  . Multiple sclerosis Sister   . Factor V Leiden deficiency Sister   . Lung cancer Maternal Grandfather        smoked  . Emphysema Sister        smoked    Social History   Socioeconomic History  . Marital status: Married    Spouse name: Not on file  . Number of children: 4  . Years of education: Not on file  . Highest education level: Not on file  Occupational History  . Occupation: architectural drawing-retired  Social Needs  . Financial resource strain: Not on file  . Food insecurity:    Worry: Not on file    Inability: Not on file  . Transportation needs:    Medical: Not on file    Non-medical: Not on file  Tobacco Use  . Smoking status: Former Smoker    Packs/day: 1.00    Years: 35.00    Pack years: 35.00    Types: Cigarettes    Last attempt to quit: 09/16/2006    Years since quitting: 11.3  . Smokeless tobacco: Never Used  Substance and Sexual Activity  . Alcohol use: No    Alcohol/week: 0.0 oz  . Drug use: No  . Sexual activity: Not on file  Lifestyle  . Physical activity:    Days per week: Not on file    Minutes per session: Not on file  . Stress: Not on file  Relationships  . Social connections:    Talks on phone: Not on file    Gets together: Not on file    Attends religious service: Not on file    Active member of club or organization: Not on file    Attends meetings of clubs or organizations: Not on file    Relationship status: Not on file  . Intimate partner violence:    Fear of current or ex partner: Not on file     Emotionally abused: Not on file    Physically abused: Not on file    Forced sexual activity: Not on file  Other Topics Concern  . Not on file  Social History Narrative  . Not on file      Review of systems: Review of Systems  Constitutional: Negative for fever and chills.  Positive for fatigue HENT: Positive for hearing problems  Eyes: Negative for blurred vision.  Respiratory: Negative for cough and wheezing.  Positive for shortness of breath Cardiovascular: Negative for chest pain and palpitations.  Gastrointestinal: as per HPI Genitourinary: Negative for dysuria, urgency, frequency and hematuria.  Musculoskeletal: Positive for myalgias, back pain and joint pain.  Skin: Negative for itching and rash.  Neurological: Negative for dizziness, tremors, focal weakness, seizures and loss of consciousness.  Positive for sleeping problems Endo/Heme/Allergies: Positive for seasonal allergies.  Psychiatric/Behavioral: Negative for depression, suicidal ideas and hallucinations.  All other systems reviewed and are negative.   Physical Exam: Vitals:   01/05/18 0818  BP: 132/72  Pulse: 80   Body mass index is 48.58 kg/m. Gen:      No acute distress, obese HEENT:  EOMI, sclera anicteric Neck:     No masses; no thyromegaly Lungs:    Clear to auscultation bilaterally; normal respiratory effort CV:         Regular rate and rhythm; no murmurs Abd:      + bowel sounds; soft, non-tender; no palpable masses, no distension, midline scar Ext:    1 edema; adequate peripheral perfusion Skin:      Warm and dry; no rash Neuro: alert and oriented x 3 Psych: normal mood and affect  Data Reviewed:  Reviewed labs, radiology imaging, old records and pertinent past GI work up   Assessment and Plan/Recommendations:  25 yr M morbid obesity, diabetes, hypertension, hyperlipidemia, CAD, COPD, OSA on continuous home oxygen 2 L, status post thoraco-abdominal aortic aneurysm repair here for evaluation  of severe iron deficiency anemia Past due for colorectal cancer screening We will obtain records of prior GI workup from Dr. Benson Norway Scheduled for EGD and colonoscopy at [facility] endoscopy unit given his co-morbidities The risks and benefits as well as alternatives of endoscopic procedure(s) have been discussed and reviewed. All questions answered. The patient agrees to proceed.      Damaris Hippo , MD [phone]    CC: Helane Rima, MD

## 2018-01-04 NOTE — H&P (View-Only) (Signed)
Alfred Carney    [id number]    [birth year]  Primary Care Physician:Howell, Bryn Gulling, MD  Referring Physician: Helane Rima, MD [name], [place] [address]  Chief complaint: Iron deficiency anemia  HPI:  67 year old male with obesity, COPD, OSA, multiple comorbidities was referred here for evaluation of iron deficiency anemia.  He is having worsening shortness of breath, somewhat improved since he started wearing continuous oxygen. Denies any overt melena or blood per rectum.  His stool is dark since he started taking oral iron tablets. Any nausea, vomiting, dysphagia, abdominal pain or change in bowel habits.  No loss of appetite or weight loss. Last colonoscopy in 2007 by Dr. Carol Ada per patient, he thinks he may have 1 or 2 small polyps removed at that time.  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  Reviewed labs from March 2019 Hemoglobin 10.9, hematocrit 36, creatinine 1.34 Iron 24, percentage saturation 5, ferritin 9 and TIBC 522   Outpatient Encounter Medications as of 01/05/2018  Medication Sig  . aspirin EC 81 MG tablet Take 81 mg by mouth daily.  . Cholecalciferol (VITAMIN D PO) Take 1 capsule by mouth 2 (two) times daily.  . furosemide (LASIX) 20 MG tablet Take 1 tablet (20 mg total) by mouth daily as needed.  Marland Kitchen ibuprofen (ADVIL,MOTRIN) 200 MG tablet Take 200-400 mg by mouth every 6 (six) hours as needed for pain.  . metFORMIN (GLUCOPHAGE) 500 MG tablet Take 500 mg by mouth 2 (two) times daily with a meal.   . metoprolol succinate (TOPROL-XL) 25 MG 24 hr tablet Take 25 mg by mouth daily.  . nitroGLYCERIN (NITROSTAT) 0.4 MG SL tablet Place 1 tablet (0.4 mg total) under the tongue every 5 (five) minutes x 3 doses as needed for chest  pain.  Marland Kitchen omeprazole (PRILOSEC) 20 MG capsule Take 20 mg by mouth 2 (two) times daily before a meal.   . rosuvastatin (CRESTOR) 10 MG tablet Take 5 mg by mouth daily.  . Tiotropium Bromide-Olodaterol (STIOLTO RESPIMAT) 2.5-2.5 MCG/ACT AERS Inhale 2 puffs into the lungs daily.   No facility-administered encounter medications on file as of 01/05/2018.     Allergies as of 01/05/2018  . (No Known Allergies)    Past Medical History:  Diagnosis Date  . Cancer ([place])    skin cancer  . Chronic obstructive pulmonary disease ([place]) 2008   Moderate  . Colon polyps   . Community acquired pneumonia   . Coronary artery disease   . Degenerative joint disease   . Emphysema lung ([place])   . Enlarged liver    fatty liver by '09 CT  . Gastroesophageal reflux disease   . Gout   . H/O hiatal hernia   . History of Rocky Mountain spotted fever    Possible history of Rocky [place] Spotted Fever  . Hyperlipidemia   . Hypertension   . Hypokalemia    diuretic induced, resolved  . Morbid obesity ([place])   . Shortness of breath   . Sleep apnea    not currently using CPAP 05/20/13  . 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 CARDIAC CATHETERIZATION  2007  Ejection fraction is estimated at 60%  . CHOLECYSTECTOMY    . HARDWARE REMOVAL Left 05/26/2013   Procedure: HARDWARE REMOVAL;  Surgeon: Newt Minion, MD;  Location: [facility];  Service: Orthopedics;  Laterality: Left;  Left Total Hip Arthroplasty, Removal of Deep Hardware  . HIP SURGERY     Status post left hip surgery with bone grafting  . LEFT HEART CATHETERIZATION WITH CORONARY ANGIOGRAM N/A 01/31/2014   Procedure: LEFT HEART CATHETERIZATION WITH CORONARY ANGIOGRAM;  Surgeon: Burnell Blanks, MD;  Location: [facility] CATH LAB;  Service: Cardiovascular;  Laterality: N/A;  . THORACOABDOMINAL AORTIC ANEURYSM REPAIR     with right femoral and left iliac BPG and  reimplantation of renal arteries.  . TONSILLECTOMY    . TOTAL HIP ARTHROPLASTY Left 05/26/2013   Procedure: TOTAL HIP ARTHROPLASTY;  Surgeon: Newt Minion, MD;  Location: [facility];  Service: Orthopedics;  Laterality: Left;  Left Total Hip Arthroplasty, Removal Deep Hardware    Family History  Problem Relation Age of Onset  . Osteoarthritis Mother   . Diabetes Mother   . Heart disease Father        Coronary Artery Disease  . Multiple sclerosis Sister   . Factor V Leiden deficiency Sister   . Lung cancer Maternal Grandfather        smoked  . Emphysema Sister        smoked    Social History   Socioeconomic History  . Marital status: Married    Spouse name: Not on file  . Number of children: 4  . Years of education: Not on file  . Highest education level: Not on file  Occupational History  . Occupation: architectural drawing-retired  Social Needs  . Financial resource strain: Not on file  . Food insecurity:    Worry: Not on file    Inability: Not on file  . Transportation needs:    Medical: Not on file    Non-medical: Not on file  Tobacco Use  . Smoking status: Former Smoker    Packs/day: 1.00    Years: 35.00    Pack years: 35.00    Types: Cigarettes    Last attempt to quit: 09/16/2006    Years since quitting: 11.3  . Smokeless tobacco: Never Used  Substance and Sexual Activity  . Alcohol use: No    Alcohol/week: 0.0 oz  . Drug use: No  . Sexual activity: Not on file  Lifestyle  . Physical activity:    Days per week: Not on file    Minutes per session: Not on file  . Stress: Not on file  Relationships  . Social connections:    Talks on phone: Not on file    Gets together: Not on file    Attends religious service: Not on file    Active member of club or organization: Not on file    Attends meetings of clubs or organizations: Not on file    Relationship status: Not on file  . Intimate partner violence:    Fear of current or ex partner: Not on file     Emotionally abused: Not on file    Physically abused: Not on file    Forced sexual activity: Not on file  Other Topics Concern  . Not on file  Social History Narrative  . Not on file      Review of systems: Review of Systems  Constitutional: Negative for fever and chills.  Positive for fatigue HENT: Positive for hearing problems  Eyes: Negative for blurred vision.  Respiratory: Negative for cough and wheezing.  Positive for shortness of breath Cardiovascular: Negative for chest pain and palpitations.  Gastrointestinal: as per HPI Genitourinary: Negative for dysuria, urgency, frequency and hematuria.  Musculoskeletal: Positive for myalgias, back pain and joint pain.  Skin: Negative for itching and rash.  Neurological: Negative for dizziness, tremors, focal weakness, seizures and loss of consciousness.  Positive for sleeping problems Endo/Heme/Allergies: Positive for seasonal allergies.  Psychiatric/Behavioral: Negative for depression, suicidal ideas and hallucinations.  All other systems reviewed and are negative.   Physical Exam: Vitals:   01/05/18 0818  BP: 132/72  Pulse: 80   Body mass index is 48.58 kg/m. Gen:      No acute distress, obese HEENT:  EOMI, sclera anicteric Neck:     No masses; no thyromegaly Lungs:    Clear to auscultation bilaterally; normal respiratory effort CV:         Regular rate and rhythm; no murmurs Abd:      + bowel sounds; soft, non-tender; no palpable masses, no distension, midline scar Ext:    1 edema; adequate peripheral perfusion Skin:      Warm and dry; no rash Neuro: alert and oriented x 3 Psych: normal mood and affect  Data Reviewed:  Reviewed labs, radiology imaging, old records and pertinent past GI work up   Assessment and Plan/Recommendations:  15 yr M morbid obesity, diabetes, hypertension, hyperlipidemia, CAD, COPD, OSA on continuous home oxygen 2 L, status post thoraco-abdominal aortic aneurysm repair here for evaluation  of severe iron deficiency anemia Past due for colorectal cancer screening We will obtain records of prior GI workup from Dr. Benson Norway Scheduled for EGD and colonoscopy at [facility] endoscopy unit given his co-morbidities The risks and benefits as well as alternatives of endoscopic procedure(s) have been discussed and reviewed. All questions answered. The patient agrees to proceed.      Damaris Hippo , MD [phone]    CC: Helane Rima, MD

## 2018-01-05 ENCOUNTER — Encounter: Payer: Self-pay | Admitting: Gastroenterology

## 2018-01-05 ENCOUNTER — Ambulatory Visit (INDEPENDENT_AMBULATORY_CARE_PROVIDER_SITE_OTHER): Payer: Medicare HMO | Admitting: Internal Medicine

## 2018-01-05 ENCOUNTER — Ambulatory Visit: Payer: Medicare HMO | Admitting: Gastroenterology

## 2018-01-05 ENCOUNTER — Ambulatory Visit: Payer: Medicare HMO | Admitting: Internal Medicine

## 2018-01-05 ENCOUNTER — Encounter: Payer: Self-pay | Admitting: Internal Medicine

## 2018-01-05 VITALS — BP 126/58 | HR 77 | Ht 68.5 in | Wt 320.0 lb

## 2018-01-05 VITALS — BP 132/72 | HR 80 | Ht 68.0 in | Wt 319.5 lb

## 2018-01-05 DIAGNOSIS — Z9981 Dependence on supplemental oxygen: Secondary | ICD-10-CM

## 2018-01-05 DIAGNOSIS — J9611 Chronic respiratory failure with hypoxia: Secondary | ICD-10-CM | POA: Diagnosis not present

## 2018-01-05 DIAGNOSIS — J449 Chronic obstructive pulmonary disease, unspecified: Secondary | ICD-10-CM | POA: Diagnosis not present

## 2018-01-05 DIAGNOSIS — D500 Iron deficiency anemia secondary to blood loss (chronic): Secondary | ICD-10-CM

## 2018-01-05 LAB — PULMONARY FUNCTION TEST
DL/VA % pred: 56 %
DL/VA: 2.56 ml/min/mmHg/L
DLCO unc % pred: 38 %
DLCO unc: 11.58 ml/min/mmHg
FEF 25-75 Post: 0.81 L/sec
FEF 25-75 Pre: 0.73 L/sec
FEF2575-%Change-Post: 11 %
FEF2575-%Pred-Post: 33 %
FEF2575-%Pred-Pre: 30 %
FEV1-%Change-Post: 6 %
FEV1-%Pred-Post: 52 %
FEV1-%Pred-Pre: 49 %
FEV1-Post: 1.66 L
FEV1-Pre: 1.56 L
FEV1FVC-%Change-Post: 1 %
FEV1FVC-%Pred-Pre: 80 %
FEV6-%Change-Post: 3 %
FEV6-%Pred-Post: 66 %
FEV6-%Pred-Pre: 64 %
FEV6-Post: 2.68 L
FEV6-Pre: 2.6 L
FEV6FVC-%Change-Post: -1 %
FEV6FVC-%Pred-Post: 103 %
FEV6FVC-%Pred-Pre: 105 %
FVC-%Change-Post: 4 %
FVC-%Pred-Post: 64 %
FVC-%Pred-Pre: 61 %
FVC-Post: 2.74 L
FVC-Pre: 2.63 L
Post FEV1/FVC ratio: 60 %
Post FEV6/FVC ratio: 98 %
Pre FEV1/FVC ratio: 59 %
Pre FEV6/FVC Ratio: 99 %
RV % pred: 124 %
RV: 2.89 L
TLC % pred: 87 %
TLC: 5.87 L

## 2018-01-05 NOTE — Patient Instructions (Signed)
You have been scheduled for a colonoscopy/endoscopy at [facility] on 01/22/2018 at 9:15am  We will obtain your records from Dr Benson Norway  If you are age 68 or older, your body mass index should be between 23-30. Your Body mass index is 48.58 kg/m. If this is out of the aforementioned range listed, please consider follow up with your Primary Care Provider.  If you are age 56 or younger, your body mass index should be between 19-25. Your Body mass index is 48.58 kg/m. If this is out of the aformentioned range listed, please consider follow up with your Primary Care Provider.

## 2018-01-05 NOTE — Progress Notes (Signed)
Subjective:   Patient ID: Alfred Carney, male    DOB: 31-Mar-1951    MRN: [id number]    Brief patient profile:  18  yowm quit smoking 2008 with baseline weight of 240 when he stopped smoking in March of 2008 p pna with breathing only some better then  worse since summer 2015 on so referred 05/17/2015 to pulmonary clinic by Dr Helane Rima for copd eval as had been seen here in 2012 with GOLD II criteria.     History of Present Illness 05/17/2015 1st [facility] office visit/ Wert  / copd eval on ACEi  Chief Complaint  Patient presents with  . Pulmonary Consult    Referre by Dr. Helane Rima. Pt c/o SOB x 10 yrs, gradually worse over the past yr.  He states that he gets SOB with minimal exertion such as taking a shower or walking 49 yrds on flat surface.   indolent onset progressive doe but always ok at rest/  Sleeps in recliner x 10 years Has osa/ could not tol cpap and tends to fall asleep p eats Assoc with sense of nasal and throat congestion but very little cough - does have hb but controls with prn ppi Had been on several different inhalers including according to the records Spiriva but didn't think any of them really helped and stopped them years ago rec Stop lisinopril  Valsartan 80 mg one daily in place of lisinopril Prilosec Take 30- 60 min before your first and last meals of the day just until you return  GERD  Diet     06/28/2015  f/u ov/Wert re: GOLD II copd/ obesity Chief Complaint  Patient presents with  . Follow-up    PFT done today. Pt states his breathing is unchanged. He c/o cough for the past month- prod with min clear sputum.    Was some better until caught cold x 2 weeks p serving jury duty Has not tried sleeping s recliner  rec Stop hydrodiuril (thiazides contribute to gout)  Start lasix 20 mg daily as needed for swelling  - if you cut the sal out you may not need this  Symbicort 160 Take 2 puffs first thing in am and then another 2 puffs about 12  hours later.      08/15/2015  f/u ov/Wert re: uacs/ copd GOLD II/ symptoms better on ppi bid ac  Chief Complaint  Patient presents with  . Follow-up    pt following for COPD: pt states hes is doing pretty well since he has last been here. pt c/o some wheezing and SOB with exertion but states it is getting better. no c/o of cough or chest tightness.  pt states he stopped taking the symbicort he felt worse when he was on it.    Really Not limited by breathing from desired activities   rec Weight control is simply a matter of calorie balance   Think of your calorie balance like you do your bank account where in this case you want the balance to go down so you must take in less calories than you burn up.  It's just that simple:  Hard to do, but easy to understand.  Good luck!  You do not have significant limiting  copd and unlikely you ever will - pulmonary follow up is as needed     11/19/2017  Extended f/u ov/Wert re-establish for  worse sob x 3 y  Chief Complaint  Patient presents with  . Follow-up  SOB that has not gotten any better since last OV .  Has a CPAP  but unable to tolerate .   indolent gradually worsening Dyspnea: 50 ft to car down 6 steps from the deck and struggles to back x 3-4 months Cough: none Sleep: poor/ restlessness/ recliner but barely elevated / has cpap but can't tol at all  SABA use:  Not using one now  Not using prilosec regularly  rec stiolto 2 pffs each am  Wear 02 2lpm 24/7 for now Please remember to go to the lab and x-ray department downstairs in the basement  for your tests - we will call you with the results when they are available.   Please schedule a follow up office visit in 6 weeks, call sooner if needed with all medications /inhalers/ solutions in hand so we can verify exactly what you are taking. This includes all medications from all doctors and over the counters  - full pfts on return    01/05/2018  f/u ov/Wert re:   COPD II 02 2 lpm - no  better with stiolto so did not fill  / did not bring meds  Chief Complaint  Patient presents with  . Follow-up    PFT's done today.  Breathing is unchanged since the last visit.    Dyspnea:  MMRC3 = can't walk 100 yards even at a slow pace at a flat grade s stopping due to sob   Cough: none Sleep: recliner < 30 degrees due to back and breathing SABA use:  Not helping   Overt hb on ppi bid ac > for EGD by Dr Nyoka Cowden  No obvious day to day or daytime variability or assoc excess/ purulent sputum or mucus plugs or hemoptysis or cp or chest tightness, subjective wheeze or overt sinus  symptoms. No unusual exposure hx or h/o childhood pna/ asthma or knowledge of premature birth.  Sleeping recliner  without nocturnal  or early am exacerbation  of respiratory  c/o's or need for noct saba. Also denies any obvious fluctuation of symptoms with weather or environmental changes or other aggravating or alleviating factors except as outlined above   Current Allergies, Complete Past Medical History, Past Surgical History, Family History, and Social History were reviewed in [employer] record.  ROS  The following are not active complaints unless bolded Hoarseness, sore throat, dysphagia, dental problems, itching, sneezing,  nasal congestion or discharge of excess mucus or purulent secretions, ear ache,   fever, chills, sweats, unintended wt loss or wt gain, classically pleuritic or exertional cp,  orthopnea pnd or arm/hand swelling  or leg swelling, presyncope, palpitations, abdominal pain, anorexia, nausea, vomiting, diarrhea  or change in bowel habits or change in bladder habits, change in stools or change in urine, dysuria, hematuria,  rash, arthralgias, visual complaints, headache, numbness, weakness or ataxia or problems with walking or coordination,  change in mood or  memory.        Current Meds  Medication Sig  . amLODipine (NORVASC) 5 MG tablet Take 1 tablet by mouth daily.    Marland Kitchen aspirin EC 81 MG tablet Take 81 mg by mouth daily.  . Cholecalciferol (VITAMIN D PO) Take 1 capsule by mouth 2 (two) times daily.  . ferrous sulfate 325 (65 FE) MG tablet Take 1 tablet by mouth daily.  . furosemide (LASIX) 20 MG tablet Take 1 tablet (20 mg total) by mouth daily as needed.  . irbesartan (AVAPRO) 300 MG tablet Take 1 tablet by  mouth daily.  Marland Kitchen ketoconazole (NIZORAL) 2 % shampoo Apply 1 application topically 2 (two) times daily.  . metFORMIN (GLUCOPHAGE) 500 MG tablet Take 500 mg by mouth 2 (two) times daily with a meal.   . metoprolol succinate (TOPROL-XL) 25 MG 24 hr tablet Take 25 mg by mouth daily.  . nitroGLYCERIN (NITROSTAT) 0.4 MG SL tablet Place 1 tablet (0.4 mg total) under the tongue every 5 (five) minutes x 3 doses as needed for chest pain.  Marland Kitchen omeprazole (PRILOSEC) 20 MG capsule Take 20 mg by mouth 2 (two) times daily before a meal.   . OXYGEN Inhale 2 L/min into the lungs continuous.   . rosuvastatin (CRESTOR) 10 MG tablet Take 5 mg by mouth daily.  . tamsulosin (FLOMAX) 0.4 MG CAPS capsule Take 1 capsule by mouth daily.  . Tiotropium Bromide-Olodaterol (STIOLTO RESPIMAT) 2.5-2.5 MCG/ACT AERS Inhale 2 puffs into the lungs daily.                           Objective:   Physical Exam    Obese wm nad at rest   06/28/2015        326 > 08/15/2015  323 >  11/19/2017  326 >  01/05/2018 320     05/17/15 329 lb (149.233 kg)  01/19/15 322 lb (146.058 kg)  01/18/15 319 lb (144.697 kg)      Vital signs reviewed - Note on arrival 02 sats  95% on 2lpm     HEENT: nl dentition, turbinates bilaterally, and oropharynx. Nl external ear canals without cough reflex   NECK :  without JVD/Nodes/TM/ nl carotid upstrokes bilaterally   LUNGS: no acc muscle use,  Nl contour chest distant bs s wheeze and without cough on insp or exp maneuvers   CV:  RRR  no s3 or murmur or increase in P2, and trace sym bilateral pedal  edema   ABD:  Quite tensely obese but   nontender with npoor inspiratory excursion. No bruits or organomegaly appreciated, bowel sounds nl  MS:  Nl gait/ ext warm without deformities, calf tenderness, cyanosis or clubbing No obvious joint restrictions   SKIN: warm and dry without lesions    NEURO:  alert, approp, nl sensorium with  no motor or cerebellar deficits apparent.                        Assessment & Plan:

## 2018-01-05 NOTE — Patient Instructions (Addendum)
Call me with the name of your blood pressure pill and the name of your inhaler    Only use your Albuterol as a rescue medication to be used if you can't catch your breath by resting or doing a relaxed purse lip breathing pattern.  - The less you use it, the better it will work when you need it. - Ok to use up to 2 puffs  every 4 hours if you must but call for immediate appointment if use goes up over your usual need - Don't leave home without it !!  (think of it like the spare tire for your car)   Please see patient coordinator before you leave today  to schedule pulmonary rehab at [facility]    Please schedule a follow up office visit in 4 weeks, sooner if needed  with all medications /inhalers/ solutions in hand so we can verify exactly what you are taking. This includes all medications from all doctors and over the counters

## 2018-01-05 NOTE — Progress Notes (Signed)
PFT completed 01/05/18  

## 2018-01-06 ENCOUNTER — Telehealth: Payer: Self-pay | Admitting: Internal Medicine

## 2018-01-06 ENCOUNTER — Encounter: Payer: Self-pay | Admitting: Internal Medicine

## 2018-01-06 NOTE — Assessment & Plan Note (Signed)
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 - 05/17/2015  Walked RA  2 laps @ 185 ft each stopped due to  Sob/ desat at nl pace to 83%   - trial off acei 05/17/2015  - PFT's  06/28/2015  FEV1 1.67 (51 % ) ratio 55  p 13 % improvement from saba with DLCO  47 % corrects to 66 % for alv volume  - rec trial of symbicort 06/28/15 > coughing / breathing worse so stopped it   - 08/15/2015  Walked RA x 3 laps @ 185 ft each stopped due to End of study, nl pace, min sob/  desat  To 88%  - sats 81% at rest and not acutely ill 11/19/2017 > placed on 2lpm (see separate a/p)  - Spirometry 11/19/2017  FEV1 1.60 (49%)  Ratio 59 with classic curvature - 11/19/2017  After extensive coaching inhaler device  effectiveness =    90% with smi > trial of stiolto> did not help so stopped p 2 week trial    PFT's  01/05/2018  FEV1 1.66 (52 % ) ratio 60  p 6 % improvement from saba p 6 prior to study with DLCO  38 % corrects to 56  % for alv volume   - 01/05/2018  Referred to rehab    Clearly very debilitated at this point more related to obesity / deconditioning though than truly limited by ventilatory ceiling imposed by copd, at least at the level of activity he can tolerate  Desperately needs pulmonary rehab next and just use saba prn for now, consider adding back just lama in future since no apparent response to saba or laba.   I had an extended discussion with the patient and his wife  reviewing all relevant studies completed to date and  lasting 15 to 20 minutes of a 25 minute visit    Each maintenance medication was reviewed in detail including most importantly the difference between maintenance and prns and under what circumstances the prns are to be triggered using an action plan format that is not reflected in the computer generated alphabetically organized AVS.    Please see AVS for specific instructions unique  to this visit that I personally wrote and verbalized to the the pt in detail and then reviewed with pt  by my nurse highlighting any  changes in therapy recommended at today's visit to their plan of care.

## 2018-01-06 NOTE — Assessment & Plan Note (Signed)
11/19/2017  sats 81% at rest in a chronic stable state so rec 2lpm 24/7   Adequate control on present rx, reviewed in detail with pt > no change in rx needed

## 2018-01-06 NOTE — Telephone Encounter (Signed)
Called and spoke to patient's wife. Let her know that the Ventolin inhaler is on his medication list. She thanked staff for calling her back and getting that updated. Nothing further is needed at this time.

## 2018-01-06 NOTE — Telephone Encounter (Signed)
Pt is returning call. Cb is [phone].

## 2018-01-06 NOTE — Telephone Encounter (Signed)
lmtcb for pt/ spouse.  Ventolin has been added to pt's med list.

## 2018-01-06 NOTE — Assessment & Plan Note (Signed)
Body mass index is 47.95 kg/m.  -  trending down slightly / encouraged to use 02 more consistently to help burn fat Lab Results  Component Value Date   TSH 3.74 11/19/2017     Contributing to gerd risk/ doe/reviewed the need and the process to achieve and maintain neg calorie balance > defer f/u primary care including intermittently monitoring thyroid status

## 2018-01-20 ENCOUNTER — Other Ambulatory Visit: Payer: Self-pay

## 2018-01-20 ENCOUNTER — Encounter: Payer: Medicare HMO | Attending: Internal Medicine

## 2018-01-20 ENCOUNTER — Encounter (HOSPITAL_COMMUNITY): Payer: Self-pay

## 2018-01-20 VITALS — Ht 69.0 in | Wt 318.9 lb

## 2018-01-20 DIAGNOSIS — K219 Gastro-esophageal reflux disease without esophagitis: Secondary | ICD-10-CM | POA: Insufficient documentation

## 2018-01-20 DIAGNOSIS — Z6841 Body Mass Index (BMI) 40.0 and over, adult: Secondary | ICD-10-CM | POA: Insufficient documentation

## 2018-01-20 DIAGNOSIS — Z8679 Personal history of other diseases of the circulatory system: Secondary | ICD-10-CM | POA: Diagnosis not present

## 2018-01-20 DIAGNOSIS — J449 Chronic obstructive pulmonary disease, unspecified: Secondary | ICD-10-CM | POA: Diagnosis not present

## 2018-01-20 DIAGNOSIS — E785 Hyperlipidemia, unspecified: Secondary | ICD-10-CM | POA: Diagnosis not present

## 2018-01-20 DIAGNOSIS — Z87891 Personal history of nicotine dependence: Secondary | ICD-10-CM | POA: Insufficient documentation

## 2018-01-20 DIAGNOSIS — Z7982 Long term (current) use of aspirin: Secondary | ICD-10-CM | POA: Diagnosis not present

## 2018-01-20 DIAGNOSIS — Z9981 Dependence on supplemental oxygen: Secondary | ICD-10-CM | POA: Insufficient documentation

## 2018-01-20 DIAGNOSIS — D509 Iron deficiency anemia, unspecified: Secondary | ICD-10-CM | POA: Diagnosis not present

## 2018-01-20 DIAGNOSIS — Z85828 Personal history of other malignant neoplasm of skin: Secondary | ICD-10-CM | POA: Insufficient documentation

## 2018-01-20 DIAGNOSIS — Z79899 Other long term (current) drug therapy: Secondary | ICD-10-CM | POA: Insufficient documentation

## 2018-01-20 DIAGNOSIS — I2510 Atherosclerotic heart disease of native coronary artery without angina pectoris: Secondary | ICD-10-CM | POA: Insufficient documentation

## 2018-01-20 DIAGNOSIS — M1990 Unspecified osteoarthritis, unspecified site: Secondary | ICD-10-CM | POA: Insufficient documentation

## 2018-01-20 DIAGNOSIS — R0602 Shortness of breath: Secondary | ICD-10-CM | POA: Diagnosis not present

## 2018-01-20 DIAGNOSIS — I10 Essential (primary) hypertension: Secondary | ICD-10-CM | POA: Diagnosis not present

## 2018-01-20 DIAGNOSIS — E119 Type 2 diabetes mellitus without complications: Secondary | ICD-10-CM | POA: Diagnosis not present

## 2018-01-20 DIAGNOSIS — Z7984 Long term (current) use of oral hypoglycemic drugs: Secondary | ICD-10-CM | POA: Insufficient documentation

## 2018-01-20 NOTE — Progress Notes (Signed)
Pulmonary Individual Treatment Plan  Patient Details  Name: Djibril Glogowski MRN: [id number] Date of Birth: 06-27-1951 Referring Provider:     Pulmonary Rehab from 01/20/2018 in [facility] Cardiac and Pulmonary Rehab  Referring Provider  Christinia Gully MD      Initial Encounter Date:    Pulmonary Rehab from 01/20/2018 in [facility] Cardiac and Pulmonary Rehab  Date  01/20/18  Referring Provider  Christinia Gully MD      Visit Diagnosis: Chronic obstructive pulmonary disease, unspecified COPD type ([place])  Patient's Home Medications on Admission:  Current Outpatient Medications:  .  albuterol (PROVENTIL HFA;VENTOLIN HFA) 108 (90 Base) MCG/ACT inhaler, Inhale 2 puffs into the lungs every 6 (six) hours as needed for wheezing or shortness of breath., Disp: 1 Inhaler, Rfl: 6 .  amLODipine (NORVASC) 5 MG tablet, Take 5 mg by mouth daily. , Disp: , Rfl:  .  aspirin EC 81 MG tablet, Take 81 mg by mouth daily., Disp: , Rfl:  .  Cholecalciferol (VITAMIN D PO), Take 1 capsule by mouth 2 (two) times daily., Disp: , Rfl:  .  ferrous sulfate 325 (65 FE) MG tablet, Take 325 mg by mouth daily with breakfast. , Disp: , Rfl:  .  furosemide (LASIX) 20 MG tablet, Take 1 tablet (20 mg total) by mouth daily as needed. (Patient taking differently: Take 20 mg by mouth daily. ), Disp: 30 tablet, Rfl: 11 .  irbesartan (AVAPRO) 300 MG tablet, Take 300 mg by mouth daily. , Disp: , Rfl:  .  ketoconazole (NIZORAL) 2 % shampoo, Apply 1 application topically every other day. , Disp: , Rfl:  .  metFORMIN (GLUCOPHAGE) 500 MG tablet, Take 500 mg by mouth 2 (two) times daily with a meal. , Disp: , Rfl:  .  metoprolol succinate (TOPROL-XL) 25 MG 24 hr tablet, Take 25 mg by mouth daily., Disp: , Rfl:  .  nitroGLYCERIN (NITROSTAT) 0.4 MG SL tablet, Place 1 tablet (0.4 mg total) under the tongue every 5 (five) minutes x 3 doses as needed for chest pain., Disp: 25 tablet, Rfl: 4 .  omeprazole (PRILOSEC) 20 MG capsule, Take 20 mg by mouth daily as  needed (for acid reflux). , Disp: , Rfl:  .  OXYGEN, Inhale 2 L into the lungs continuous. , Disp: , Rfl:  .  rosuvastatin (CRESTOR) 10 MG tablet, Take 5 mg by mouth daily., Disp: , Rfl:  .  tamsulosin (FLOMAX) 0.4 MG CAPS capsule, Take 0.4 mg by mouth daily. , Disp: , Rfl:   Past Medical History: Past Medical History:  Diagnosis Date  . Chronic obstructive pulmonary disease ([place]) 2008   Moderate  . Colon polyps   . Community acquired pneumonia   . Coronary artery disease   . Degenerative joint disease   . Diabetes mellitus without complication ([place])   . Emphysema lung ([place])   . Enlarged liver    fatty liver by '09 CT  . Gastroesophageal reflux disease   . Gout   . H/O hiatal hernia   . History of [place] spotted fever    Possible history of [place] Spotted Fever  . Hyperlipidemia   . Hypertension   . Hypokalemia    diuretic induced, resolved  . Microcytic anemia   . Morbid obesity ([place])   . Shortness of breath   . Skin cancer    skin cancer  . Sleep apnea    not currently using CPAP 05/20/13  . 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 Packs/day: 1.00  . Years: 35.00  . Pack years: 35.00  . Types: Cigarettes  . Last attempt to quit: 09/16/2006  . Years since quitting: 11.3  Smokeless Tobacco Never Used    Labs: Recent Review Scientist, physiological    Labs for ITP Cardiac and Pulmonary Rehab Latest Ref Rng & Units 10/15/2011 01/14/2012 01/29/2014 05/08/2014   Cholestrol 0 - 200 mg/dL 167 82 88 -   LDLCALC 0 - 99 mg/dL 104(H) 25 32 -   HDL >39 mg/dL 41.00 36.40(L) 30(L) -   Trlycerides <150 mg/dL 112.0 102.0 130 -   Hemoglobin A1c <5.7 % - - - 6.6(H)       Pulmonary Assessment Scores: Pulmonary Assessment Scores    Row Name 01/20/18 1502         ADL UCSD   ADL Phase  Entry     SOB Score total  88     Rest  1     Walk  4     Stairs  4      Bath  5     Dress  3     Shop  5       CAT Score   CAT Score  23       mMRC Score   mMRC Score  4        Pulmonary Function Assessment: Pulmonary Function Assessment - 01/20/18 1439      Pulmonary Function Tests   FVC%  64 %    FEV1%  52 %      Breath   Bilateral Breath Sounds  Clear;Decreased    Shortness of Breath  Yes;Limiting activity       Exercise Target Goals: Date: 01/20/18  Exercise Program Goal: Individual exercise prescription set using results from initial 6 min walk test and THRR while considering  patient's activity barriers and safety.    Exercise Prescription Goal: Initial exercise prescription builds to 30-45 minutes a day of aerobic activity, 2-3 days per week.  Home exercise guidelines will be given to patient during program as part of exercise prescription that the participant will acknowledge.  Activity Barriers & Risk Stratification: Activity Barriers & Cardiac Risk Stratification - 01/20/18 1531      Activity Barriers & Cardiac Risk Stratification   Activity Barriers  Deconditioning;Muscular Weakness;Balance Concerns;Shortness of Breath;History of Falls;Assistive Device uses cane for balance       6 Minute Walk: 6 Minute Walk    Row Name 01/20/18 1526         6 Minute Walk   Phase  Initial     Distance  142 feet     Walk Time  1.05 minutes test terminated due to desaturation     # of Rest Breaks  0     MPH  1.54     METS  0.09     RPE  13     Perceived Dyspnea   2     VO2 Peak  0.32     Symptoms  Yes (comment)     Comments  SOB, uses cane for balance     Resting HR  70 bpm     Resting BP  136/64     Resting Oxygen Saturation   90 %     Exercise Oxygen Saturation  during 6 min walk  73 %     Max  Ex. HR  100 bpm     Max Ex. BP  168/74     2 Minute Post BP  156/70 140/74       Interval HR   1 Minute HR  100     2 Minute HR  99 test stopped at 1:03     3 Minute HR  91     4 Minute HR  77     6 Minute HR  68     Interval  Heart Rate?  Yes       Interval Oxygen   Interval Oxygen?  Yes     Baseline Oxygen Saturation %  90 %     1 Minute Oxygen Saturation %  80 % test terminated at 1:03 73%     1 Minute Liters of Oxygen  2 L continuous     2 Minute Oxygen Saturation %  87 %     2 Minute Liters of Oxygen  2 L     4 Minute Oxygen Saturation %  95 %     4 Minute Liters of Oxygen  2 L     6 Minute Oxygen Saturation %  94 %     6 Minute Liters of Oxygen  2 L       Oxygen Initial Assessment: Oxygen Initial Assessment - 01/20/18 1441      Home Oxygen   Home Oxygen Device  Home Concentrator;E-Tanks    Sleep Oxygen Prescription  CPAP;Continuous    Liters per minute  2    Home Exercise Oxygen Prescription  Continuous    Liters per minute  2    Home at Rest Exercise Oxygen Prescription  Continuous    Liters per minute  2    Compliance with Home Oxygen Use  No does not wear CPAP      Initial 6 min Walk   Oxygen Used  Continuous    Liters per minute  2      Program Oxygen Prescription   Program Oxygen Prescription  Continuous    Liters per minute  2      Intervention   Short Term Goals  To learn and understand importance of monitoring SPO2 with pulse oximeter and demonstrate accurate use of the pulse oximeter.;To learn and demonstrate proper pursed lip breathing techniques or other breathing techniques.;To learn and demonstrate proper use of respiratory medications;To learn and understand importance of maintaining oxygen saturations>88%;To learn and exhibit compliance with exercise, home and travel O2 prescription    Long  Term Goals  Exhibits compliance with exercise, home and travel O2 prescription;Verbalizes importance of monitoring SPO2 with pulse oximeter and return demonstration;Maintenance of O2 saturations>88%;Exhibits proper breathing techniques, such as pursed lip breathing or other method taught during program session;Compliance with respiratory medication;Demonstrates proper use of MDI's        Oxygen Re-Evaluation:   Oxygen Discharge (Final Oxygen Re-Evaluation):   Initial Exercise Prescription: Initial Exercise Prescription - 01/20/18 1500      Date of Initial Exercise RX and Referring Provider   Date  01/20/18    Referring Provider  Christinia Gully MD      Oxygen   Oxygen  Continuous    Liters  4      Treadmill   MPH  0.8    Grade  0    Minutes  15    METs  1.6      T5 Nustep   Level  1    SPM  80  Minutes  15    METs  1.5      Biostep-RELP   Level  1    SPM  40    Minutes  15    METs  2      Prescription Details   Frequency (times per week)  3    Duration  Progress to 45 minutes of aerobic exercise without signs/symptoms of physical distress      Intensity   THRR 40-80% of Max Heartrate  103-136    Ratings of Perceived Exertion  11-13    Perceived Dyspnea  0-4      Progression   Progression  Continue to progress workloads to maintain intensity without signs/symptoms of physical distress.      Resistance Training   Training Prescription  Yes    Weight  3 lbs    Reps  10-15       Perform Capillary Blood Glucose checks as needed.  Exercise Prescription Changes: Exercise Prescription Changes    Row Name 01/20/18 1500             Response to Exercise   Blood Pressure (Admit)  136/64       Blood Pressure (Exercise)  168/74       Blood Pressure (Exit)  140/74       Heart Rate (Admit)  70 bpm       Heart Rate (Exercise)  100 bpm       Heart Rate (Exit)  68 bpm       Oxygen Saturation (Admit)  90 %       Oxygen Saturation (Exercise)  73 %       Oxygen Saturation (Exit)  94 %       Rating of Perceived Exertion (Exercise)  13       Perceived Dyspnea (Exercise)  2       Symptoms  SOB       Comments  walk test results, uses cane          Exercise Comments:   Exercise Goals and Review: Exercise Goals    Row Name 01/20/18 1539             Exercise Goals   Increase Physical Activity  Yes       Intervention  Provide  advice, education, support and counseling about physical activity/exercise needs.;Develop an individualized exercise prescription for aerobic and resistive training based on initial evaluation findings, risk stratification, comorbidities and participant's personal goals.       Expected Outcomes  Short Term: Attend rehab on a regular basis to increase amount of physical activity.;Long Term: Add in home exercise to make exercise part of routine and to increase amount of physical activity.;Long Term: Exercising regularly at least 3-5 days a week.       Increase Strength and Stamina  Yes       Intervention  Provide advice, education, support and counseling about physical activity/exercise needs.;Develop an individualized exercise prescription for aerobic and resistive training based on initial evaluation findings, risk stratification, comorbidities and participant's personal goals.       Expected Outcomes  Short Term: Increase workloads from initial exercise prescription for resistance, speed, and METs.;Short Term: Perform resistance training exercises routinely during rehab and add in resistance training at home;Long Term: Improve cardiorespiratory fitness, muscular endurance and strength as measured by increased METs and functional capacity (6MWT)       Able to understand and use rate of perceived exertion (RPE) scale  Yes  Intervention  Provide education and explanation on how to use RPE scale       Expected Outcomes  Short Term: Able to use RPE daily in rehab to express subjective intensity level;Long Term:  Able to use RPE to guide intensity level when exercising independently       Able to understand and use Dyspnea scale  Yes       Intervention  Provide education and explanation on how to use Dyspnea scale       Expected Outcomes  Short Term: Able to use Dyspnea scale daily in rehab to express subjective sense of shortness of breath during exertion;Long Term: Able to use Dyspnea scale to guide  intensity level when exercising independently       Knowledge and understanding of Target Heart Rate Range (THRR)  Yes       Intervention  Provide education and explanation of THRR including how the numbers were predicted and where they are located for reference       Expected Outcomes  Short Term: Able to state/look up THRR;Short Term: Able to use daily as guideline for intensity in rehab;Long Term: Able to use THRR to govern intensity when exercising independently       Able to check pulse independently  Yes       Intervention  Provide education and demonstration on how to check pulse in carotid and radial arteries.;Review the importance of being able to check your own pulse for safety during independent exercise       Expected Outcomes  Short Term: Able to explain why pulse checking is important during independent exercise;Long Term: Able to check pulse independently and accurately       Understanding of Exercise Prescription  Yes       Intervention  Provide education, explanation, and written materials on patient's individual exercise prescription       Expected Outcomes  Short Term: Able to explain program exercise prescription;Long Term: Able to explain home exercise prescription to exercise independently          Exercise Goals Re-Evaluation :   Discharge Exercise Prescription (Final Exercise Prescription Changes): Exercise Prescription Changes - 01/20/18 1500      Response to Exercise   Blood Pressure (Admit)  136/64    Blood Pressure (Exercise)  168/74    Blood Pressure (Exit)  140/74    Heart Rate (Admit)  70 bpm    Heart Rate (Exercise)  100 bpm    Heart Rate (Exit)  68 bpm    Oxygen Saturation (Admit)  90 %    Oxygen Saturation (Exercise)  73 %    Oxygen Saturation (Exit)  94 %    Rating of Perceived Exertion (Exercise)  13    Perceived Dyspnea (Exercise)  2    Symptoms  SOB    Comments  walk test results, uses cane       Nutrition:  Target Goals: Understanding of  nutrition guidelines, daily intake of sodium <1534m, cholesterol <2070m calories 30% from fat and 7% or less from saturated fats, daily to have 5 or more servings of fruits and vegetables.  Biometrics: Pre Biometrics - 01/20/18 1540      Pre Biometrics   Height  _0  (1.753 m)    Weight  318 lb 14.4 oz (144.7 kg)  (Abnormal)     Waist Circumference  56.5 inches    Hip Circumference  46.5 inches    Waist to Hip Ratio  1.22 %  BMI (Calculated)  47.07    Single Leg Stand  1.93 seconds        Nutrition Therapy Plan and Nutrition Goals: Nutrition Therapy & Goals - 01/20/18 1439      Personal Nutrition Goals   Comments  Patient wants to lose weight, breath better and learn to eat a better diet. He is lacking energy      Intervention Plan   Intervention  Prescribe, educate and counsel regarding individualized specific dietary modifications aiming towards targeted core components such as weight, hypertension, lipid management, diabetes, heart failure and other comorbidities.;Nutrition handout(s) given to patient.    Expected Outcomes  Short Term Goal: Understand basic principles of dietary content, such as calories, fat, sodium, cholesterol and nutrients.;Long Term Goal: Adherence to prescribed nutrition plan.       Nutrition Assessments:   Nutrition Goals Re-Evaluation:   Nutrition Goals Discharge (Final Nutrition Goals Re-Evaluation):   Psychosocial: Target Goals: Acknowledge presence or absence of significant depression and/or stress, maximize coping skills, provide positive support system. Participant is able to verbalize types and ability to use techniques and skills needed for reducing stress and depression.   Initial Review & Psychosocial Screening: Initial Psych Review & Screening - 01/20/18 1437      Initial Review   Current issues with  Current Sleep Concerns;Current Stress Concerns    Source of Stress Concerns  Chronic Illness;Unable to perform yard/household  activities    Comments  COPD is most stressful issue      [place]?  Yes    Comments  He can look to his wife and children for support.      Barriers   Psychosocial barriers to participate in program  The patient should benefit from training in stress management and relaxation.      Screening Interventions   Interventions  Encouraged to exercise;Program counselor consult;To provide support and resources with identified psychosocial needs;Provide feedback about the scores to participant    Expected Outcomes  Short Term goal: Utilizing psychosocial counselor, staff and physician to assist with identification of specific Stressors or current issues interfering with healing process. Setting desired goal for each stressor or current issue identified.;Long Term Goal: Stressors or current issues are controlled or eliminated.;Short Term goal: Identification and review with participant of any Quality of Life or Depression concerns found by scoring the questionnaire.;Long Term goal: The participant improves quality of Life and PHQ9 Scores as seen by post scores and/or verbalization of changes       Quality of Life Scores:  Scores of 19 and below usually indicate a poorer quality of life in these areas.  A difference of  2-3 points is a clinically meaningful difference.  A difference of 2-3 points in the total score of the Quality of Life Index has been associated with significant improvement in overall quality of life, self-image, physical symptoms, and general health in studies assessing change in quality of life.  PHQ-9: Recent Review Flowsheet Data    Depression screen [name] 2/9 01/20/2018 01/19/2015 07/19/2014 05/31/2014 05/31/2014   Decreased Interest 1 0 0 0 0   Down, Depressed, Hopeless 0 0 0 0 0   PHQ - 2 Score 1 0 0 0 0   Altered sleeping 1 - - - -   Tired, decreased energy 2 - - - -   Change in appetite 0 - - - -   Feeling bad or failure about yourself  1 - - - -  Trouble concentrating 2 - - - -   Moving slowly or fidgety/restless 0 - - - -   Suicidal thoughts 0 - - - -   PHQ-9 Score 7 - - - -   Difficult doing work/chores Somewhat difficult - - - -     Interpretation of Total Score  Total Score Depression Severity:  1-4 = Minimal depression, 5-9 = Mild depression, 10-14 = Moderate depression, 15-19 = Moderately severe depression, 20-27 = Severe depression   Psychosocial Evaluation and Intervention:   Psychosocial Re-Evaluation:   Psychosocial Discharge (Final Psychosocial Re-Evaluation):   Education: Education Goals: Education classes will be provided on a weekly basis, covering required topics. Participant will state understanding/return demonstration of topics presented.  Learning Barriers/Preferences: Learning Barriers/Preferences - 01/20/18 1441      Learning Barriers/Preferences   Learning Barriers  Hearing    Learning Preferences  None       Education Topics:  Initial Evaluation Education: - Verbal, written and demonstration of respiratory meds, oximetry and breathing techniques. Instruction on use of nebulizers and MDIs and importance of monitoring MDI activations.   Pulmonary Rehab from 01/20/2018 in [facility] Cardiac and Pulmonary Rehab  Date  01/20/18  Educator  [facility]  Instruction Review Code  1- Verbalizes Understanding      General Nutrition Guidelines/Fats and Fiber: -Group instruction provided by verbal, written material, models and posters to present the general guidelines for heart healthy nutrition. Gives an explanation and review of dietary fats and fiber.   Controlling Sodium/Reading Food Labels: -Group verbal and written material supporting the discussion of sodium use in heart healthy nutrition. Review and explanation with models, verbal and written materials for utilization of the food label.   Exercise Physiology & General Exercise Guidelines: - Group verbal and written instruction with models to review the  exercise physiology of the cardiovascular system and associated critical values. Provides general exercise guidelines with specific guidelines to those with heart or lung disease.    Aerobic Exercise & Resistance Training: - Gives group verbal and written instruction on the various components of exercise. Focuses on aerobic and resistive training programs and the benefits of this training and how to safely progress through these programs.   Flexibility, Balance, Mind/Body Relaxation: Provides group verbal/written instruction on the benefits of flexibility and balance training, including mind/body exercise modes such as yoga, pilates and tai chi.  Demonstration and skill practice provided.   Stress and Anxiety: - Provides group verbal and written instruction about the health risks of elevated stress and causes of high stress.  Discuss the correlation between heart/lung disease and anxiety and treatment options. Review healthy ways to manage with stress and anxiety.   Depression: - Provides group verbal and written instruction on the correlation between heart/lung disease and depressed mood, treatment options, and the stigmas associated with seeking treatment.   Exercise & Equipment Safety: - Individual verbal instruction and demonstration of equipment use and safety with use of the equipment.   Pulmonary Rehab from 01/20/2018 in [facility] Cardiac and Pulmonary Rehab  Date  01/20/18  Educator  [facility]  Instruction Review Code  1- Verbalizes Understanding      Infection Prevention: - Provides verbal and written material to individual with discussion of infection control including proper hand washing and proper equipment cleaning during exercise session.   Pulmonary Rehab from 01/20/2018 in [facility] Cardiac and Pulmonary Rehab  Date  01/20/18  Educator  [facility]  Instruction Review Code  1- Verbalizes Understanding  Falls Prevention: - Provides verbal and written material to individual with discussion  of falls prevention and safety.   Pulmonary Rehab from 01/20/2018 in [facility] Cardiac and Pulmonary Rehab  Date  01/20/18  Educator  [facility]  Instruction Review Code  1- Verbalizes Understanding      Diabetes: - Individual verbal and written instruction to review signs/symptoms of diabetes, desired ranges of glucose level fasting, after meals and with exercise. Advice that pre and post exercise glucose checks will be done for 3 sessions at entry of program.   Chronic Lung Diseases: - Group verbal and written instruction to review updates, respiratory medications, advancements in procedures and treatments. Discuss use of supplemental oxygen including available portable oxygen systems, continuous and intermittent flow rates, concentrators, personal use and safety guidelines. Review proper use of inhaler and spacers. Provide informative websites for self-education.    Energy Conservation: - Provide group verbal and written instruction for methods to conserve energy, plan and organize activities. Instruct on pacing techniques, use of adaptive equipment and posture/positioning to relieve shortness of breath.   Triggers and Exacerbations: - Group verbal and written instruction to review types of environmental triggers and ways to prevent exacerbations. Discuss weather changes, air quality and the benefits of nasal washing. Review warning signs and symptoms to help prevent infections. Discuss techniques for effective airway clearance, coughing, and vibrations.   AED/CPR: - Group verbal and written instruction with the use of models to demonstrate the basic use of the AED with the basic ABC's of resuscitation.   Anatomy and Physiology of the Lungs: - Group verbal and written instruction with the use of models to provide basic lung anatomy and physiology related to function, structure and complications of lung disease.   Anatomy & Physiology of the Heart: - Group verbal and written instruction and  models provide basic cardiac anatomy and physiology, with the coronary electrical and arterial systems. Review of Valvular disease and Heart Failure   Cardiac Medications: - Group verbal and written instruction to review commonly prescribed medications for heart disease. Reviews the medication, class of the drug, and side effects.   Know Your Numbers and Risk Factors: -Group verbal and written instruction about important numbers in your health.  Discussion of what are risk factors and how they play a role in the disease process.  Review of Cholesterol, Blood Pressure, Diabetes, and BMI and the role they play in your overall health.   Sleep Hygiene: -Provides group verbal and written instruction about how sleep can affect your health.  Define sleep hygiene, discuss sleep cycles and impact of sleep habits. Review good sleep hygiene tips.    Other: -Provides group and verbal instruction on various topics (see comments)    Knowledge Questionnaire Score: Knowledge Questionnaire Score - 01/20/18 1440      Knowledge Questionnaire Score   Pre Score  13/18 reviewed with patient        Core Components/Risk Factors/Patient Goals at Admission: Personal Goals and Risk Factors at Admission - 01/20/18 1443      Core Components/Risk Factors/Patient Goals on Admission    Weight Management  Yes;Obesity;Weight Loss    Intervention  Weight Management: Develop a combined nutrition and exercise program designed to reach desired caloric intake, while maintaining appropriate intake of nutrient and fiber, sodium and fats, and appropriate energy expenditure required for the weight goal.;Weight Management: Provide education and appropriate resources to help participant work on and attain dietary goals.;Weight Management/Obesity: Establish reasonable short term and long term weight goals.  Admit Weight  318 lb 14.4 oz (144.7 kg)    Goal Weight: Short Term  313 lb (142 kg)    Goal Weight: Long Term  200 lb  (90.7 kg)    Expected Outcomes  Short Term: Continue to assess and modify interventions until short term weight is achieved;Long Term: Adherence to nutrition and physical activity/exercise program aimed toward attainment of established weight goal;Weight Maintenance: Understanding of the daily nutrition guidelines, which includes 25-35% calories from fat, 7% or less cal from saturated fats, less than 212m cholesterol, less than 1.5gm of sodium, & 5 or more servings of fruits and vegetables daily;Weight Loss: Understanding of general recommendations for a balanced deficit meal plan, which promotes 1-2 lb weight loss per week and includes a negative energy balance of [phone] kcal/d;Understanding recommendations for meals to include 15-35% energy as protein, 25-35% energy from fat, 35-60% energy from carbohydrates, less than 2023mof dietary cholesterol, 20-35 gm of total fiber daily;Understanding of distribution of calorie intake throughout the day with the consumption of 4-5 meals/snacks    Improve shortness of breath with ADL's  Yes    Intervention  Provide education, individualized exercise plan and daily activity instruction to help decrease symptoms of SOB with activities of daily living.    Expected Outcomes  Short Term: Improve cardiorespiratory fitness to achieve a reduction of symptoms when performing ADLs;Long Term: Be able to perform more ADLs without symptoms or delay the onset of symptoms    Diabetes  Yes    Intervention  Provide education about signs/symptoms and action to take for hypo/hyperglycemia.;Provide education about proper nutrition, including hydration, and aerobic/resistive exercise prescription along with prescribed medications to achieve blood glucose in normal ranges: Fasting glucose 65-99 mg/dL    Expected Outcomes  Short Term: Participant verbalizes understanding of the signs/symptoms and immediate care of hyper/hypoglycemia, proper foot care and importance of medication,  aerobic/resistive exercise and nutrition plan for blood glucose control.;Long Term: Attainment of HbA1C < 7%.    Heart Failure  Yes    Intervention  Provide a combined exercise and nutrition program that is supplemented with education, support and counseling about heart failure. Directed toward relieving symptoms such as shortness of breath, decreased exercise tolerance, and extremity edema.    Expected Outcomes  Improve functional capacity of life;Short term: Attendance in program 2-3 days a week with increased exercise capacity. Reported lower sodium intake. Reported increased fruit and vegetable intake. Reports medication compliance.;Long term: Adoption of self-care skills and reduction of barriers for early signs and symptoms recognition and intervention leading to self-care maintenance.;Short term: Daily weights obtained and reported for increase. Utilizing diuretic protocols set by physician.    Hypertension  Yes    Intervention  Provide education on lifestyle modifcations including regular physical activity/exercise, weight management, moderate sodium restriction and increased consumption of fresh fruit, vegetables, and low fat dairy, alcohol moderation, and smoking cessation.;Monitor prescription use compliance.    Expected Outcomes  Short Term: Continued assessment and intervention until BP is < 140/9067mG in hypertensive participants. < 130/67m11m in hypertensive participants with diabetes, heart failure or chronic kidney disease.;Long Term: Maintenance of blood pressure at goal levels.    Lipids  Yes    Intervention  Provide education and support for participant on nutrition & aerobic/resistive exercise along with prescribed medications to achieve LDL <70mg35mL >40mg.34mExpected Outcomes  Long Term: Cholesterol controlled with medications as prescribed, with individualized exercise RX and with personalized nutrition plan. Value goals: LDL <  76m, HDL > 40 mg.;Short Term: Participant states  understanding of desired cholesterol values and is compliant with medications prescribed. Participant is following exercise prescription and nutrition guidelines.       Core Components/Risk Factors/Patient Goals Review:    Core Components/Risk Factors/Patient Goals at Discharge (Final Review):    ITP Comments: ITP Comments    Row Name 01/20/18 1414           ITP Comments  Medical Evaluation completed. Chart sent for review and changes to Dr. MEmily FilbertDirector of [facility] can be found in [facility]          Comments: Initial ITP

## 2018-01-20 NOTE — Patient Instructions (Signed)
Patient Instructions  Patient Details  Name: Alfred Carney MRN: [id number] Date of Birth: Jan 30, 1951 Referring Provider:  Tanda Rockers, MD  Below are your personal goals for exercise, nutrition, and risk factors. Our goal is to help you stay on track towards obtaining and maintaining these goals. We will be discussing your progress on these goals with you throughout the program.  Initial Exercise Prescription: Initial Exercise Prescription - 01/20/18 1500      Date of Initial Exercise RX and Referring Provider   Date  01/20/18    Referring Provider  Christinia Gully MD      Oxygen   Oxygen  Continuous    Liters  4      Treadmill   MPH  0.8    Grade  0    Minutes  15    METs  1.6      T5 Nustep   Level  1    SPM  80    Minutes  15    METs  1.5      Biostep-RELP   Level  1    SPM  40    Minutes  15    METs  2      Prescription Details   Frequency (times per week)  3    Duration  Progress to 45 minutes of aerobic exercise without signs/symptoms of physical distress      Intensity   THRR 40-80% of Max Heartrate  103-136    Ratings of Perceived Exertion  11-13    Perceived Dyspnea  0-4      Progression   Progression  Continue to progress workloads to maintain intensity without signs/symptoms of physical distress.      Resistance Training   Training Prescription  Yes    Weight  3 lbs    Reps  10-15       Exercise Goals: Frequency: Be able to perform aerobic exercise two to three times per week in program working toward 2-5 days per week of home exercise.  Intensity: Work with a perceived exertion of 11 (fairly light) - 15 (hard) while following your exercise prescription.  We will make changes to your prescription with you as you progress through the program.   Duration: Be able to do 30 to 45 minutes of continuous aerobic exercise in addition to a 5 minute warm-up and a 5 minute cool-down routine.   Nutrition Goals: Your personal nutrition goals will be  established when you do your nutrition analysis with the dietician.  The following are general nutrition guidelines to follow: Cholesterol < 200mg /day Sodium < 1500mg /day Fiber: Men over 50 yrs - 30 grams per day  Personal Goals: Personal Goals and Risk Factors at Admission - 01/20/18 1443      Core Components/Risk Factors/Patient Goals on Admission    Weight Management  Yes;Obesity;Weight Loss    Intervention  Weight Management: Develop a combined nutrition and exercise program designed to reach desired caloric intake, while maintaining appropriate intake of nutrient and fiber, sodium and fats, and appropriate energy expenditure required for the weight goal.;Weight Management: Provide education and appropriate resources to help participant work on and attain dietary goals.;Weight Management/Obesity: Establish reasonable short term and long term weight goals.    Admit Weight  318 lb 14.4 oz (144.7 kg)    Goal Weight: Short Term  313 lb (142 kg)    Goal Weight: Long Term  200 lb (90.7 kg)    Expected Outcomes  Short Term: Continue to  assess and modify interventions until short term weight is achieved;Long Term: Adherence to nutrition and physical activity/exercise program aimed toward attainment of established weight goal;Weight Maintenance: Understanding of the daily nutrition guidelines, which includes 25-35% calories from fat, 7% or less cal from saturated fats, less than 200mg  cholesterol, less than 1.5gm of sodium, & 5 or more servings of fruits and vegetables daily;Weight Loss: Understanding of general recommendations for a balanced deficit meal plan, which promotes 1-2 lb weight loss per week and includes a negative energy balance of [phone] kcal/d;Understanding recommendations for meals to include 15-35% energy as protein, 25-35% energy from fat, 35-60% energy from carbohydrates, less than 200mg  of dietary cholesterol, 20-35 gm of total fiber daily;Understanding of distribution of calorie  intake throughout the day with the consumption of 4-5 meals/snacks    Improve shortness of breath with ADL's  Yes    Intervention  Provide education, individualized exercise plan and daily activity instruction to help decrease symptoms of SOB with activities of daily living.    Expected Outcomes  Short Term: Improve cardiorespiratory fitness to achieve a reduction of symptoms when performing ADLs;Long Term: Be able to perform more ADLs without symptoms or delay the onset of symptoms    Diabetes  Yes    Intervention  Provide education about signs/symptoms and action to take for hypo/hyperglycemia.;Provide education about proper nutrition, including hydration, and aerobic/resistive exercise prescription along with prescribed medications to achieve blood glucose in normal ranges: Fasting glucose 65-99 mg/dL    Expected Outcomes  Short Term: Participant verbalizes understanding of the signs/symptoms and immediate care of hyper/hypoglycemia, proper foot care and importance of medication, aerobic/resistive exercise and nutrition plan for blood glucose control.;Long Term: Attainment of HbA1C < 7%.    Heart Failure  Yes    Intervention  Provide a combined exercise and nutrition program that is supplemented with education, support and counseling about heart failure. Directed toward relieving symptoms such as shortness of breath, decreased exercise tolerance, and extremity edema.    Expected Outcomes  Improve functional capacity of life;Short term: Attendance in program 2-3 days a week with increased exercise capacity. Reported lower sodium intake. Reported increased fruit and vegetable intake. Reports medication compliance.;Long term: Adoption of self-care skills and reduction of barriers for early signs and symptoms recognition and intervention leading to self-care maintenance.;Short term: Daily weights obtained and reported for increase. Utilizing diuretic protocols set by physician.    Hypertension  Yes     Intervention  Provide education on lifestyle modifcations including regular physical activity/exercise, weight management, moderate sodium restriction and increased consumption of fresh fruit, vegetables, and low fat dairy, alcohol moderation, and smoking cessation.;Monitor prescription use compliance.    Expected Outcomes  Short Term: Continued assessment and intervention until BP is < 140/32mm HG in hypertensive participants. < 130/75mm HG in hypertensive participants with diabetes, heart failure or chronic kidney disease.;Long Term: Maintenance of blood pressure at goal levels.    Lipids  Yes    Intervention  Provide education and support for participant on nutrition & aerobic/resistive exercise along with prescribed medications to achieve LDL 70mg , HDL >40mg .    Expected Outcomes  Long Term: Cholesterol controlled with medications as prescribed, with individualized exercise RX and with personalized nutrition plan. Value goals: LDL < 70mg , HDL > 40 mg.;Short Term: Participant states understanding of desired cholesterol values and is compliant with medications prescribed. Participant is following exercise prescription and nutrition guidelines.       Tobacco Use Initial Evaluation: Social History   Tobacco Use  Smoking Status Former Smoker  . Packs/day: 1.00  . Years: 35.00  . Pack years: 35.00  . Types: Cigarettes  . Last attempt to quit: 09/16/2006  . Years since quitting: 11.3  Smokeless Tobacco Never Used    Exercise Goals and Review: Exercise Goals    Row Name 01/20/18 1539             Exercise Goals   Increase Physical Activity  Yes       Intervention  Provide advice, education, support and counseling about physical activity/exercise needs.;Develop an individualized exercise prescription for aerobic and resistive training based on initial evaluation findings, risk stratification, comorbidities and participant's personal goals.       Expected Outcomes  Short Term: Attend rehab  on a regular basis to increase amount of physical activity.;Long Term: Add in home exercise to make exercise part of routine and to increase amount of physical activity.;Long Term: Exercising regularly at least 3-5 days a week.       Increase Strength and Stamina  Yes       Intervention  Provide advice, education, support and counseling about physical activity/exercise needs.;Develop an individualized exercise prescription for aerobic and resistive training based on initial evaluation findings, risk stratification, comorbidities and participant's personal goals.       Expected Outcomes  Short Term: Increase workloads from initial exercise prescription for resistance, speed, and METs.;Short Term: Perform resistance training exercises routinely during rehab and add in resistance training at home;Long Term: Improve cardiorespiratory fitness, muscular endurance and strength as measured by increased METs and functional capacity (6MWT)       Able to understand and use rate of perceived exertion (RPE) scale  Yes       Intervention  Provide education and explanation on how to use RPE scale       Expected Outcomes  Short Term: Able to use RPE daily in rehab to express subjective intensity level;Long Term:  Able to use RPE to guide intensity level when exercising independently       Able to understand and use Dyspnea scale  Yes       Intervention  Provide education and explanation on how to use Dyspnea scale       Expected Outcomes  Short Term: Able to use Dyspnea scale daily in rehab to express subjective sense of shortness of breath during exertion;Long Term: Able to use Dyspnea scale to guide intensity level when exercising independently       Knowledge and understanding of Target Heart Rate Range (THRR)  Yes       Intervention  Provide education and explanation of THRR including how the numbers were predicted and where they are located for reference       Expected Outcomes  Short Term: Able to state/look up  THRR;Short Term: Able to use daily as guideline for intensity in rehab;Long Term: Able to use THRR to govern intensity when exercising independently       Able to check pulse independently  Yes       Intervention  Provide education and demonstration on how to check pulse in carotid and radial arteries.;Review the importance of being able to check your own pulse for safety during independent exercise       Expected Outcomes  Short Term: Able to explain why pulse checking is important during independent exercise;Long Term: Able to check pulse independently and accurately       Understanding of Exercise Prescription  Yes  Intervention  Provide education, explanation, and written materials on patient's individual exercise prescription       Expected Outcomes  Short Term: Able to explain program exercise prescription;Long Term: Able to explain home exercise prescription to exercise independently          Copy of goals given to participant.

## 2018-01-21 ENCOUNTER — Encounter (HOSPITAL_COMMUNITY): Payer: Self-pay

## 2018-01-21 ENCOUNTER — Other Ambulatory Visit: Payer: Self-pay

## 2018-01-22 ENCOUNTER — Encounter (HOSPITAL_COMMUNITY): Payer: Self-pay

## 2018-01-22 ENCOUNTER — Ambulatory Visit (HOSPITAL_COMMUNITY)
Admission: RE | Admit: 2018-01-22 | Discharge: 2018-01-22 | Disposition: A | Discharge status: Home / Self Care | Payer: Medicare HMO | Source: Ambulatory Visit | Attending: Gastroenterology | Admitting: Gastroenterology

## 2018-01-22 ENCOUNTER — Ambulatory Visit (HOSPITAL_COMMUNITY): Payer: Medicare HMO | Admitting: Registered Nurse

## 2018-01-22 ENCOUNTER — Other Ambulatory Visit: Payer: Self-pay

## 2018-01-22 ENCOUNTER — Telehealth: Payer: Self-pay

## 2018-01-22 ENCOUNTER — Encounter (HOSPITAL_COMMUNITY)
Admission: RE | Disposition: A | Discharge status: Home / Self Care | Payer: Self-pay | Source: Ambulatory Visit | Attending: Gastroenterology

## 2018-01-22 DIAGNOSIS — Z6841 Body Mass Index (BMI) 40.0 and over, adult: Secondary | ICD-10-CM | POA: Diagnosis not present

## 2018-01-22 DIAGNOSIS — E1151 Type 2 diabetes mellitus with diabetic peripheral angiopathy without gangrene: Secondary | ICD-10-CM | POA: Diagnosis not present

## 2018-01-22 DIAGNOSIS — K760 Fatty (change of) liver, not elsewhere classified: Secondary | ICD-10-CM | POA: Insufficient documentation

## 2018-01-22 DIAGNOSIS — I10 Essential (primary) hypertension: Secondary | ICD-10-CM | POA: Insufficient documentation

## 2018-01-22 DIAGNOSIS — Z9981 Dependence on supplemental oxygen: Secondary | ICD-10-CM | POA: Insufficient documentation

## 2018-01-22 DIAGNOSIS — J439 Emphysema, unspecified: Secondary | ICD-10-CM | POA: Diagnosis not present

## 2018-01-22 DIAGNOSIS — D125 Benign neoplasm of sigmoid colon: Secondary | ICD-10-CM | POA: Diagnosis not present

## 2018-01-22 DIAGNOSIS — Z7982 Long term (current) use of aspirin: Secondary | ICD-10-CM | POA: Insufficient documentation

## 2018-01-22 DIAGNOSIS — Z86010 Personal history of colonic polyps: Secondary | ICD-10-CM | POA: Insufficient documentation

## 2018-01-22 DIAGNOSIS — D122 Benign neoplasm of ascending colon: Secondary | ICD-10-CM | POA: Diagnosis not present

## 2018-01-22 DIAGNOSIS — K5521 Angiodysplasia of colon with hemorrhage: Secondary | ICD-10-CM | POA: Diagnosis not present

## 2018-01-22 DIAGNOSIS — K209 Esophagitis, unspecified: Secondary | ICD-10-CM | POA: Diagnosis not present

## 2018-01-22 DIAGNOSIS — Z79899 Other long term (current) drug therapy: Secondary | ICD-10-CM | POA: Insufficient documentation

## 2018-01-22 DIAGNOSIS — D509 Iron deficiency anemia, unspecified: Secondary | ICD-10-CM | POA: Insufficient documentation

## 2018-01-22 DIAGNOSIS — K648 Other hemorrhoids: Secondary | ICD-10-CM | POA: Insufficient documentation

## 2018-01-22 DIAGNOSIS — K644 Residual hemorrhoidal skin tags: Secondary | ICD-10-CM | POA: Insufficient documentation

## 2018-01-22 DIAGNOSIS — D500 Iron deficiency anemia secondary to blood loss (chronic): Secondary | ICD-10-CM

## 2018-01-22 DIAGNOSIS — D120 Benign neoplasm of cecum: Secondary | ICD-10-CM | POA: Diagnosis not present

## 2018-01-22 DIAGNOSIS — I2510 Atherosclerotic heart disease of native coronary artery without angina pectoris: Secondary | ICD-10-CM | POA: Insufficient documentation

## 2018-01-22 DIAGNOSIS — K635 Polyp of colon: Secondary | ICD-10-CM

## 2018-01-22 DIAGNOSIS — K5730 Diverticulosis of large intestine without perforation or abscess without bleeding: Secondary | ICD-10-CM | POA: Insufficient documentation

## 2018-01-22 DIAGNOSIS — E785 Hyperlipidemia, unspecified: Secondary | ICD-10-CM | POA: Diagnosis not present

## 2018-01-22 DIAGNOSIS — Z8249 Family history of ischemic heart disease and other diseases of the circulatory system: Secondary | ICD-10-CM | POA: Insufficient documentation

## 2018-01-22 DIAGNOSIS — Z7984 Long term (current) use of oral hypoglycemic drugs: Secondary | ICD-10-CM | POA: Insufficient documentation

## 2018-01-22 DIAGNOSIS — G4733 Obstructive sleep apnea (adult) (pediatric): Secondary | ICD-10-CM | POA: Insufficient documentation

## 2018-01-22 DIAGNOSIS — Z85828 Personal history of other malignant neoplasm of skin: Secondary | ICD-10-CM | POA: Insufficient documentation

## 2018-01-22 DIAGNOSIS — D127 Benign neoplasm of rectosigmoid junction: Secondary | ICD-10-CM | POA: Diagnosis not present

## 2018-01-22 DIAGNOSIS — M1990 Unspecified osteoarthritis, unspecified site: Secondary | ICD-10-CM | POA: Diagnosis not present

## 2018-01-22 DIAGNOSIS — Z96642 Presence of left artificial hip joint: Secondary | ICD-10-CM | POA: Diagnosis not present

## 2018-01-22 DIAGNOSIS — K210 Gastro-esophageal reflux disease with esophagitis: Secondary | ICD-10-CM

## 2018-01-22 DIAGNOSIS — Z87891 Personal history of nicotine dependence: Secondary | ICD-10-CM | POA: Insufficient documentation

## 2018-01-22 HISTORY — PX: ESOPHAGOGASTRODUODENOSCOPY (EGD) WITH PROPOFOL: SHX5813

## 2018-01-22 HISTORY — DX: Type 2 diabetes mellitus without complications: E11.9

## 2018-01-22 HISTORY — PX: COLONOSCOPY WITH PROPOFOL: SHX5780

## 2018-01-22 LAB — GLUCOSE, CAPILLARY: Glucose-Capillary: 115 mg/dL — ABNORMAL HIGH (ref 65–99)

## 2018-01-22 SURGERY — ESOPHAGOGASTRODUODENOSCOPY (EGD) WITH PROPOFOL
Anesthesia: Monitor Anesthesia Care

## 2018-01-22 MED ORDER — PROPOFOL 10 MG/ML IV BOLUS
INTRAVENOUS | Status: AC
  Filled 2018-01-22: qty 20

## 2018-01-22 MED ORDER — PROPOFOL 500 MG/50ML IV EMUL
INTRAVENOUS | Status: DC | PRN
  Administered 2018-01-22: 100 ug/kg/min via INTRAVENOUS

## 2018-01-22 MED ORDER — SODIUM CHLORIDE 0.9 % IV SOLN: INTRAVENOUS | Status: DC

## 2018-01-22 MED ORDER — OMEPRAZOLE 40 MG PO CPDR: 40.0000 mg | DELAYED_RELEASE_CAPSULE | Freq: Every day | ORAL | 3 refills | Status: DC

## 2018-01-22 MED ORDER — LACTATED RINGERS IV SOLN
INTRAVENOUS | Status: DC
  Administered 2018-01-22: 09:00:00 via INTRAVENOUS

## 2018-01-22 MED ORDER — LIDOCAINE 2% (20 MG/ML) 5 ML SYRINGE
INTRAMUSCULAR | Status: DC | PRN
  Administered 2018-01-22: 80 mg via INTRAVENOUS

## 2018-01-22 MED ORDER — PHENYLEPHRINE 40 MCG/ML (10ML) SYRINGE FOR IV PUSH (FOR BLOOD PRESSURE SUPPORT)
PREFILLED_SYRINGE | INTRAVENOUS | Status: DC | PRN
  Administered 2018-01-22 (×4): 80 ug via INTRAVENOUS

## 2018-01-22 MED ORDER — ONDANSETRON HCL 4 MG/2ML IJ SOLN
INTRAMUSCULAR | Status: DC | PRN
  Administered 2018-01-22: 4 mg via INTRAVENOUS

## 2018-01-22 MED ORDER — PROPOFOL 10 MG/ML IV BOLUS
INTRAVENOUS | Status: DC | PRN
  Administered 2018-01-22 (×2): 20 mg via INTRAVENOUS
  Administered 2018-01-22: 30 mg via INTRAVENOUS

## 2018-01-22 SURGICAL SUPPLY — 25 items

## 2018-01-22 NOTE — Anesthesia Procedure Notes (Signed)
Date/Time: 01/22/2018 9:45 AM Performed by: Talbot Grumbling, CRNA Oxygen Delivery Method: Nasal cannula

## 2018-01-22 NOTE — Interval H&P Note (Signed)
History and Physical Interval Note:  01/22/2018 8:43 AM  Alfred Carney  has presented today for surgery, with the diagnosis of anemia  The various methods of treatment have been discussed with the patient and family. After consideration of risks, benefits and other options for treatment, the patient has consented to  Procedure(s): ESOPHAGOGASTRODUODENOSCOPY (EGD) WITH PROPOFOL (N/A) COLONOSCOPY WITH PROPOFOL (N/A) as a surgical intervention .  The patient's history has been reviewed, patient examined, no change in status, stable for surgery.  I have reviewed the patient's chart and labs.  Questions were answered to the patient's satisfaction.     Kavitha Nandigam

## 2018-01-22 NOTE — Anesthesia Postprocedure Evaluation (Signed)
Anesthesia Post Note  Patient: Alfred Carney  Procedure(s) Performed: ESOPHAGOGASTRODUODENOSCOPY (EGD) WITH PROPOFOL (N/A ) COLONOSCOPY WITH PROPOFOL (N/A )     Patient location during evaluation: Endoscopy Anesthesia Type: MAC Level of consciousness: awake Vital Signs Assessment: post-procedure vital signs reviewed and stable Respiratory status: spontaneous breathing Cardiovascular status: stable Anesthetic complications: no    Last Vitals:  Vitals:   01/22/18 1040 01/22/18 1100  BP: (!) 108/51 120/61  Pulse: 78 71  Resp: (!) 21 (!) 21  Temp:    SpO2: 99% 97%    Last Pain:  Vitals:   01/22/18 1100  TempSrc:   PainSc: 0-No pain                 Yeiren Whitecotton

## 2018-01-22 NOTE — Transfer of Care (Signed)
Immediate Anesthesia Transfer of Care Note  Patient: Alfred Carney  Procedure(s) Performed: ESOPHAGOGASTRODUODENOSCOPY (EGD) WITH PROPOFOL (N/A ) COLONOSCOPY WITH PROPOFOL (N/A )  Patient Location: PACU  Anesthesia Type:MAC  Level of Consciousness: sedated  Airway & Oxygen Therapy: Patient Spontanous Breathing and Patient connected to face mask oxygen  Post-op Assessment: Report given to RN and Post -op Vital signs reviewed and stable  Post vital signs: Reviewed and stable  Last Vitals:  Vitals Value Taken Time  BP    Temp    Pulse    Resp    SpO2      Last Pain:  Vitals:   01/22/18 0834  TempSrc: Oral  PainSc: 1          Complications: No apparent anesthesia complications

## 2018-01-22 NOTE — Discharge Instructions (Signed)
YOU HAD AN ENDOSCOPIC PROCEDURE TODAY: Refer to the procedure report and other information in the discharge instructions given to you for any specific questions about what was found during the examination. If this information does not answer your questions, please call [place] office at [phone] to clarify.  ° °YOU SHOULD EXPECT: Some feelings of bloating in the abdomen. Passage of more gas than usual. Walking can help get rid of the air that was put into your GI tract during the procedure and reduce the bloating. If you had a lower endoscopy (such as a colonoscopy or flexible sigmoidoscopy) you may notice spotting of blood in your stool or on the toilet paper. Some abdominal soreness may be present for a day or two, also. ° °DIET: Your first meal following the procedure should be a light meal and then it is ok to progress to your normal diet. A half-sandwich or bowl of soup is an example of a good first meal. Heavy or fried foods are harder to digest and may make you feel nauseous or bloated. Drink plenty of fluids but you should avoid alcoholic beverages for 24 hours. If you had a esophageal dilation, please see attached instructions for diet.   ° °ACTIVITY: Your care partner should take you home directly after the procedure. You should plan to take it easy, moving slowly for the rest of the day. You can resume normal activity the day after the procedure however YOU SHOULD NOT DRIVE, use power tools, machinery or perform tasks that involve climbing or major physical exertion for 24 hours (because of the sedation medicines used during the test).  ° °SYMPTOMS TO REPORT IMMEDIATELY: °A gastroenterologist can be reached at any hour. Please call [phone]  for any of the following symptoms:  °Following lower endoscopy (colonoscopy, flexible sigmoidoscopy) °Excessive amounts of blood in the stool  °Significant tenderness, worsening of abdominal pains  °Swelling of the abdomen that is new, acute  °Fever of 100° or  higher  °Following upper endoscopy (EGD, EUS, ERCP, esophageal dilation) °Vomiting of blood or coffee ground material  °New, significant abdominal pain  °New, significant chest pain or pain under the shoulder blades  °Painful or persistently difficult swallowing  °New shortness of breath  °Black, tarry-looking or red, bloody stools ° °FOLLOW UP:  °If any biopsies were taken you will be contacted by phone or by letter within the next 1-3 weeks. Call [phone]  if you have not heard about the biopsies in 3 weeks.  °Please also call with any specific questions about appointments or follow up tests. ° °

## 2018-01-22 NOTE — Op Note (Signed)
[facility] Patient Name: Alfred Carney Procedure Date: 01/22/2018 MRN: [id number] Attending MD: Mauri Pole , MD Date of Birth: 1951/04/28 CSN: [id number] Age: 67 Admit Type: Outpatient Procedure:                Upper GI endoscopy Indications:              Suspected upper gastrointestinal bleeding in                            patient with unexplained iron deficiency anemia Providers:                Mauri Pole, MD, Elmer Ramp. Tilden Dome, RN,                            William Dalton, Technician Referring MD:              Medicines:                Monitored Anesthesia Care Complications:            No immediate complications. Estimated Blood Loss:     Estimated blood loss was minimal. Procedure:                Pre-Anesthesia Assessment:                           - Prior to the procedure, a History and Physical                            was performed, and patient medications and                            allergies were reviewed. The patient's tolerance of                            previous anesthesia was also reviewed. The risks                            and benefits of the procedure and the sedation                            options and risks were discussed with the patient.                            All questions were answered, and informed consent                            was obtained. Prior Anticoagulants: The patient has                            taken no previous anticoagulant or antiplatelet                            agents. ASA Grade Assessment: III - A patient with  severe systemic disease. After reviewing the risks                            and benefits, the patient was deemed in                            satisfactory condition to undergo the procedure.                           After obtaining informed consent, the endoscope was                            passed under direct vision. Throughout the               procedure, the patient's blood pressure, pulse, and                            oxygen saturations were monitored continuously. The                            [id number]-[id number] ([id number]) scope was introduced through the                            mouth, and advanced to the second part of duodenum.                            The upper GI endoscopy was accomplished without                            difficulty. The patient tolerated the procedure                            well. Scope In: Scope Out: Findings:      LA Grade C (one or more mucosal breaks continuous between tops of 2 or       more mucosal folds, less than 75% circumference) esophagitis with no       bleeding was found 32 to 36 cm from the incisors.      Esophagogastric landmarks were identified: the Z-line was found at 35 cm       and the gastroesophageal junction was found at 36 cm from the incisors.      The stomach was normal.      The examined duodenum was normal. Impression:               - LA Grade C reflux esophagitis.                           - Esophagogastric landmarks identified.                           - Normal stomach.                           - Normal examined duodenum.                           -  No specimens collected. Moderate Sedation:      N/A- Per Anesthesia Care Recommendation:           - Patient has a contact number available for                            emergencies. The signs and symptoms of potential                            delayed complications were discussed with the                            patient. Return to normal activities tomorrow.                            Written discharge instructions were provided to the                            patient.                           - Resume previous diet.                           - Continue present medications.                           - Follow an antireflux regimen.                           - Use Prilosec (omeprazole) 40 mg PO daily.                            - See the other procedure note for documentation of                            additional recommendations. Procedure Code(s):        --- Professional ---                           [phone], Esophagogastroduodenoscopy, flexible,                            transoral; diagnostic, including collection of                            specimen(s) by brushing or washing, when performed                            (separate procedure) Diagnosis Code(s):        --- Professional ---                           K21.0, Gastro-esophageal reflux disease with                            esophagitis  D50.9, Iron deficiency anemia, unspecified CPT copyright 2017 American Medical Association. All rights reserved. The codes documented in this report are preliminary and upon coder review may  be revised to meet current compliance requirements. Mauri Pole, MD 01/22/2018 10:41:31 AM This report has been signed electronically. Number of Addenda: 0

## 2018-01-22 NOTE — Telephone Encounter (Signed)
-----   Message from Mauri Pole, MD sent at 01/22/2018  3:03 PM EDT ----- Please arrange for follow-up in office next available in 2 to 3 months. Repeat hemoglobin and ferritin in 2 months Thanks VN

## 2018-01-22 NOTE — Telephone Encounter (Addendum)
Follow up on 04/15/18 at 9:30 am. Letter with appointments mailed. Plan for labs first week of July.

## 2018-01-22 NOTE — Anesthesia Preprocedure Evaluation (Addendum)
Anesthesia Evaluation  Patient identified by MRN, date of birth, ID band Patient awake    Reviewed: Allergy & Precautions, NPO status , Patient's Chart, lab work & pertinent test results  Airway Mallampati: II  TM Distance: >3 FB     Dental   Pulmonary shortness of breath, sleep apnea , pneumonia, COPD, former smoker,    breath sounds clear to auscultation       Cardiovascular hypertension, + CAD and + Peripheral Vascular Disease   Rhythm:Regular Rate:Normal     Neuro/Psych    GI/Hepatic Neg liver ROS, hiatal hernia, GERD  ,  Endo/Other  diabetes  Renal/GU negative Renal ROS     Musculoskeletal  (+) Arthritis ,   Abdominal   Peds  Hematology  (+) anemia ,   Anesthesia Other Findings   Reproductive/Obstetrics                             Anesthesia Physical Anesthesia Plan  ASA: III  Anesthesia Plan: MAC   Post-op Pain Management:    Induction: Intravenous  PONV Risk Score and Plan: Treatment may vary due to age or medical condition  Airway Management Planned: Simple Face Mask and Nasal Cannula  Additional Equipment:   Intra-op Plan:   Post-operative Plan:   Informed Consent:   Dental advisory given  Plan Discussed with: CRNA and Anesthesiologist  Anesthesia Plan Comments:         Anesthesia Quick Evaluation

## 2018-01-22 NOTE — Op Note (Signed)
[facility] Patient Name: Alfred Carney Procedure Date: 01/22/2018 MRN: [id number] Attending MD: Mauri Pole , MD Date of Birth: 06-Apr-1951 CSN: [id number] Age: 67 Admit Type: Outpatient Procedure:                Colonoscopy Indications:              Unexplained iron deficiency anemia Providers:                Mauri Pole, MD, Elmer Ramp. Tilden Dome, RN,                            William Dalton, Technician Referring MD:              Medicines:                Monitored Anesthesia Care Complications:            No immediate complications. Estimated Blood Loss:     Estimated blood loss was minimal. Procedure:                Pre-Anesthesia Assessment:                           - Prior to the procedure, a History and Physical                            was performed, and patient medications and                            allergies were reviewed. The patient's tolerance of                            previous anesthesia was also reviewed. The risks                            and benefits of the procedure and the sedation                            options and risks were discussed with the patient.                            All questions were answered, and informed consent                            was obtained. Prior Anticoagulants: The patient has                            taken no previous anticoagulant or antiplatelet                            agents. ASA Grade Assessment: III - A patient with                            severe systemic disease. After reviewing the risks  and benefits, the patient was deemed in                            satisfactory condition to undergo the procedure.                           After obtaining informed consent, the colonoscope                            was passed under direct vision. Throughout the                            procedure, the patient's blood pressure, pulse, and                             oxygen saturations were monitored continuously. The                            EC-[id number] ([id number]) scope was introduced through                            the anus and advanced to the the cecum, identified                            by appendiceal orifice and ileocecal valve. The                            colonoscopy was performed without difficulty. The                            patient tolerated the procedure well. The quality                            of the bowel preparation was excellent. The                            ileocecal valve, appendiceal orifice, and rectum                            were photographed. Scope In: 9:56:39 AM Scope Out: 10:30:05 AM Scope Withdrawal Time: 0 hours 28 minutes 50 seconds  Total Procedure Duration: 0 hours 33 minutes 26 seconds  Findings:      The perianal and digital rectal examinations were normal.      A 5 mm polyp was found in the cecum. The polyp was sessile. The polyp       was removed with a cold snare. Resection and retrieval were complete.      A 2 mm polyp was found in the recto-sigmoid colon. The polyp was       sessile. The polyp was removed with a cold biopsy forceps. Resection and       retrieval were complete.      A 8 mm polyp was found in the recto-sigmoid colon. The polyp was       semi-pedunculated. The polyp was removed with a hot snare. Resection and  retrieval were complete.      A single small localized angiodysplastic lesion with bleeding was found       in the ascending colon. Coagulation for hemostasis using argon plasma       was successful.      A 25 mm polyp was found in the ascending colon. The polyp was mixed       lateral spreading. Preparations were made for mucosal resection. Eleview       was injected to raise the lesion. Hot snare mucosal resection was       performed. Resection and retrieval were complete.      Scattered small-mouthed diverticula were found in the sigmoid colon,       descending  colon, transverse colon and ascending colon.      External and internal hemorrhoids were found during retroflexion. The       hemorrhoids were small. Impression:               - One 5 mm polyp in the cecum, removed with a cold                            snare. Resected and retrieved.                           - One 2 mm polyp at the recto-sigmoid colon,                            removed with a cold biopsy forceps. Resected and                            retrieved.                           - One 8 mm polyp at the recto-sigmoid colon,                            removed with a hot snare. Resected and retrieved.                           - A single bleeding colonic angiodysplastic lesion.                            Treated with argon plasma coagulation (APC).                           - One 25 mm polyp in the ascending colon, removed                            with mucosal resection. Resected and retrieved.                           - Mild diverticulosis in the sigmoid colon, in the                            descending colon, in the transverse colon and in  the ascending colon.                           - External and internal hemorrhoids.                           - Mucosal resection was performed. Resection and                            retrieval were complete. Moderate Sedation:      N/A- Per Anesthesia Care Recommendation:           - Patient has a contact number available for                            emergencies. The signs and symptoms of potential                            delayed complications were discussed with the                            patient. Return to normal activities tomorrow.                            Written discharge instructions were provided to the                            patient.                           - Soft diet for 2 days, then advance as tolerated                            to advance diet as tolerated.                            - Continue present medications.                           - No aspirin, ibuprofen, naproxen, or other                            non-steroidal anti-inflammatory drugs for 2 weeks.                           - Await pathology results.                           - Repeat colonoscopy in 1 year for surveillance                            after piecemeal polypectomy. Procedure Code(s):        --- Professional ---                           [id number], 20, Colonoscopy, flexible; with endoscopic  mucosal resection                           45382, 59, Colonoscopy, flexible; with control of                            bleeding, any method                           45385, Colonoscopy, flexible; with removal of                            tumor(s), polyp(s), or other lesion(s) by snare                            technique                           45380, 36, Colonoscopy, flexible; with biopsy,                            single or multiple Diagnosis Code(s):        --- Professional ---                           D12.0, Benign neoplasm of cecum                           D12.7, Benign neoplasm of rectosigmoid junction                           D12.2, Benign neoplasm of ascending colon                           K55.21, Angiodysplasia of colon with hemorrhage                           K64.8, Other hemorrhoids                           D50.9, Iron deficiency anemia, unspecified                           K57.30, Diverticulosis of large intestine without                            perforation or abscess without bleeding CPT copyright 2017 American Medical Association. All rights reserved. The codes documented in this report are preliminary and upon coder review may  be revised to meet current compliance requirements. Mauri Pole, MD 01/22/2018 10:52:49 AM This report has been signed electronically. Number of Addenda: 0

## 2018-01-23 ENCOUNTER — Encounter (HOSPITAL_COMMUNITY): Payer: Self-pay | Admitting: Gastroenterology

## 2018-01-26 DIAGNOSIS — Z79899 Other long term (current) drug therapy: Secondary | ICD-10-CM | POA: Diagnosis not present

## 2018-01-26 DIAGNOSIS — E785 Hyperlipidemia, unspecified: Secondary | ICD-10-CM | POA: Diagnosis not present

## 2018-01-26 DIAGNOSIS — E119 Type 2 diabetes mellitus without complications: Secondary | ICD-10-CM | POA: Diagnosis not present

## 2018-01-26 DIAGNOSIS — Z7984 Long term (current) use of oral hypoglycemic drugs: Secondary | ICD-10-CM | POA: Diagnosis not present

## 2018-01-26 DIAGNOSIS — I10 Essential (primary) hypertension: Secondary | ICD-10-CM | POA: Diagnosis not present

## 2018-01-26 DIAGNOSIS — Z7982 Long term (current) use of aspirin: Secondary | ICD-10-CM | POA: Diagnosis not present

## 2018-01-26 DIAGNOSIS — Z9981 Dependence on supplemental oxygen: Secondary | ICD-10-CM | POA: Diagnosis not present

## 2018-01-26 DIAGNOSIS — I2510 Atherosclerotic heart disease of native coronary artery without angina pectoris: Secondary | ICD-10-CM | POA: Diagnosis not present

## 2018-01-26 DIAGNOSIS — M1990 Unspecified osteoarthritis, unspecified site: Secondary | ICD-10-CM | POA: Diagnosis not present

## 2018-01-26 DIAGNOSIS — J449 Chronic obstructive pulmonary disease, unspecified: Secondary | ICD-10-CM | POA: Diagnosis not present

## 2018-01-26 LAB — GLUCOSE, CAPILLARY
Glucose-Capillary: 159 mg/dL — ABNORMAL HIGH (ref 65–99)
Glucose-Capillary: 103 mg/dL — ABNORMAL HIGH (ref 65–99)

## 2018-01-26 NOTE — Progress Notes (Signed)
Daily Session Note  Patient Details  Name: Alfred Carney MRN: [id number] Date of Birth: March 22, 1951 Referring Provider:     Pulmonary Rehab from 01/20/2018 in [facility]  Referring Provider  Christinia Gully MD      Encounter Date: 01/26/2018  Check In: Session Check In - 01/26/18 1144      Check-In   Location  [facility]-Cardiac & Pulmonary Rehab    Staff Present  Nyoka Cowden, RN, BSN, [place], BS, ACSM CEP, Exercise Physiologist;Serenity Batley Oletta Darter, [place], ACSM CEP, Exercise Physiologist    Supervising physician immediately available to respond to emergencies  See telemetry face sheet for immediately available ER MD    Physician(s)  Corky Downs and Alfred Levins    Medication changes reported      No    Fall or balance concerns reported     No    Warm-up and Cool-down  Performed on first and last piece of equipment    Resistance Training Performed  Yes    VAD Patient?  No      Pain Assessment   Currently in Pain?  No/denies    Multiple Pain Sites  No          Social History   Tobacco Use  Smoking Status Former Smoker  . Packs/day: 1.00  . Years: 35.00  . Pack years: 35.00  . Types: Cigarettes  . Last attempt to quit: 09/16/2006  . Years since quitting: 11.3  Smokeless Tobacco Never Used    Goals Met:  Proper associated with RPD/PD & O2 Sat Exercise tolerated well Personal goals reviewed Strength training completed today  Goals Unmet:  Not Applicable  Comments: First full day of exercise!  Patient was oriented to gym and equipment including functions, settings, policies, and procedures.  Patient's individual exercise prescription and treatment plan were reviewed.  All starting workloads were established based on the results of the 6 minute walk test done at initial orientation visit.  The plan for exercise progression was also introduced and progression will be customized based on patient's performance and goals.    Dr. Emily Filbert is Medical Director for  [place] [facility].

## 2018-01-28 ENCOUNTER — Ambulatory Visit: Payer: Medicare HMO | Admitting: Vascular Surgery

## 2018-01-28 ENCOUNTER — Other Ambulatory Visit: Payer: Self-pay

## 2018-01-28 ENCOUNTER — Encounter: Payer: Self-pay | Admitting: Vascular Surgery

## 2018-01-28 ENCOUNTER — Ambulatory Visit
Admission: RE | Admit: 2018-01-28 | Discharge: 2018-01-28 | Disposition: A | Discharge status: Home / Self Care | Payer: Medicare HMO | Source: Ambulatory Visit | Attending: Vascular Surgery | Admitting: Vascular Surgery

## 2018-01-28 VITALS — BP 147/72 | HR 65 | Temp 97.4°F | Resp 21 | Ht 69.0 in | Wt 321.5 lb

## 2018-01-28 DIAGNOSIS — I713 Abdominal aortic aneurysm, ruptured, unspecified: Secondary | ICD-10-CM

## 2018-01-28 DIAGNOSIS — Z48812 Encounter for surgical aftercare following surgery on the circulatory system: Secondary | ICD-10-CM

## 2018-01-28 DIAGNOSIS — I714 Abdominal aortic aneurysm, without rupture: Secondary | ICD-10-CM | POA: Diagnosis not present

## 2018-01-28 MED ORDER — IOPAMIDOL (ISOVUE-370) INJECTION 76%
75.0000 mL | Freq: Once | INTRAVENOUS | Status: AC | PRN
  Administered 2018-01-28: 75 mL via INTRAVENOUS

## 2018-01-28 NOTE — Progress Notes (Signed)
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  HPI:   Alfred Carney is a pleasant 67 y.o. male who I last saw 2 years ago on 01/24/2016.  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  Subsequently the patient was found on CT to have an inflammatory process in the right lower quadrant with inflammation around the right limb of the graft.  I felt that this was likely related to inflammation of the appendix.  The patient was treated with IV vancomycin and Zosyn and his symptoms resolved.  When I saw the patient subsequently in October 2015 the inflammatory process adjacent to the right limb of the graft had resolved.  At the time of his follow-up visit 2 years ago CT angiogram showed his aortic graft was intact without evidence of infection or perigraft collection.  Comes in for 2-year follow-up visit.  Of note, he was recently diagnosed with anemia and for this reason a follow-up CT scan was recommended to be sure there was no evidence of blood loss related to his previous aortic graft.  He is recently begun using oxygen which she uses most of the day.  His activity is very limited.  I do not get any history of claudication or rest pain.  Past Medical History:  Diagnosis Date  . Chronic obstructive pulmonary disease ([place]) 2008   Moderate  . Colon polyps   . Community acquired pneumonia   . Coronary artery disease   . Degenerative joint disease   . Diabetes mellitus without complication ([place])    type 2  . Emphysema lung ([place])   . Enlarged liver    fatty liver by '09 CT  . Gastroesophageal reflux disease   . Gout   . H/O hiatal hernia   . History of [place] spotted fever    Possible history of [place] Spotted Fever  . Hyperlipidemia   . Hypertension   . Hypokalemia    diuretic induced, resolved  . Microcytic anemia    iron pills    . Morbid obesity ([place])   . Shortness of breath   . Skin cancer    skin cancer lip removed  . Sleep apnea    not currently using CPAP 05/20/13  . 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    Family History  Problem Relation Age of Onset  . Osteoarthritis Mother   . Diabetes Mother   . Heart disease Father        Coronary Artery Disease  . Multiple sclerosis Sister   . Factor V Leiden deficiency Sister   . Lung cancer Maternal Grandfather        smoked  . Emphysema Sister        smoked    SOCIAL HISTORY: Social History   Tobacco Use  . Smoking status: Former Smoker    Packs/day: 1.00    Years: 35.00    Pack years: 35.00    Types: Cigarettes    Last attempt to quit: 09/16/2006    Years since quitting: 11.3  . Smokeless tobacco: Never Used  Substance Use Topics  . Alcohol use: No    Alcohol/week: 0.0 oz    No Known Allergies  Current Outpatient Medications  Medication Sig Dispense Refill  . albuterol (PROVENTIL HFA;VENTOLIN HFA) 108 (90 Base) MCG/ACT inhaler Inhale 2 puffs into the  lungs every 6 (six) hours as needed for wheezing or shortness of breath. 1 Inhaler 6  . amLODipine (NORVASC) 5 MG tablet Take 5 mg by mouth daily.     Marland Kitchen aspirin EC 81 MG tablet Take 81 mg by mouth daily.    . Cholecalciferol (VITAMIN D PO) Take 1 capsule by mouth 2 (two) times daily.    . ferrous sulfate 325 (65 FE) MG tablet Take 325 mg by mouth daily with breakfast.     . furosemide (LASIX) 20 MG tablet Take 1 tablet (20 mg total) by mouth daily as needed. (Patient taking differently: Take 20 mg by mouth daily. ) 30 tablet 11  . irbesartan (AVAPRO) 300 MG tablet Take 300 mg by mouth daily.     Marland Kitchen ketoconazole (NIZORAL) 2 % shampoo Apply 1 application topically every other day.     . metFORMIN (GLUCOPHAGE) 500 MG tablet Take 500 mg by mouth 2 (two) times daily with a meal.     . metoprolol succinate (TOPROL-XL) 25 MG 24 hr tablet Take 25 mg by mouth daily.    .  nitroGLYCERIN (NITROSTAT) 0.4 MG SL tablet Place 1 tablet (0.4 mg total) under the tongue every 5 (five) minutes x 3 doses as needed for chest pain. 25 tablet 4  . omeprazole (PRILOSEC) 40 MG capsule Take 1 capsule (40 mg total) by mouth daily. 90 capsule 3  . OXYGEN Inhale 2 L into the lungs continuous.     . rosuvastatin (CRESTOR) 10 MG tablet Take 5 mg by mouth daily.    . tamsulosin (FLOMAX) 0.4 MG CAPS capsule Take 0.4 mg by mouth daily.      No current facility-administered medications for this visit.     REVIEW OF SYSTEMS:  [X]  denotes positive finding, [ ]  denotes negative finding Cardiac  Comments:  Chest pain or chest pressure:    Shortness of breath upon exertion: x   Short of breath when lying flat: x   Irregular heart rhythm:        Vascular    Pain in calf, thigh, or hip brought on by ambulation:    Pain in feet at night that wakes you up from your sleep:     Blood clot in your veins:    Leg swelling:  x       Pulmonary    Oxygen at home:    Productive cough:     Wheezing:         Neurologic    Sudden weakness in arms or legs:     Sudden numbness in arms or legs:     Sudden onset of difficulty speaking or slurred speech:    Temporary loss of vision in one eye:     Problems with dizziness:         Gastrointestinal    Blood in stool:     Vomited blood:         Genitourinary    Burning when urinating:     Blood in urine:        Psychiatric    Major depression:         Hematologic    Bleeding problems:    Problems with blood clotting too easily:        Skin    Rashes or ulcers:        Constitutional    Fever or chills:     PHYSICAL EXAM:   Vitals:   01/28/18 1155 01/28/18 1157  BP: Marland Kitchen)  145/77 (!) 147/72  Pulse: 65   Resp: (!) 21   Temp: (!) 97.4 F (36.3 C)   TempSrc: Oral   SpO2: 90%   Weight: (!) 321 lb 8 oz (145.8 kg)   Height: 5\' 9"  (1.753 m)     GENERAL: The patient is a well-nourished male, in no acute distress. The vital signs  are documented above. CARDIAC: There is a regular rate and rhythm.  VASCULAR: I do not detect carotid bruits. Both feet are warm and well-perfused. PULMONARY: There is good air exchange bilaterally without wheezing or rales. ABDOMEN: Soft and non-tender with normal pitched bowel sounds.  MUSCULOSKELETAL: There are no major deformities or cyanosis. NEUROLOGIC: No focal weakness or paresthesias are detected. SKIN: There are no ulcers or rashes noted. PSYCHIATRIC: The patient has a normal affect.  DATA:    CT ABDOMEN PELVIS: I reviewed his CT the abdomen and pelvis.  This is not yet been read by radiology.  His aortic graft is in excellent position and I do not see any complicating factors.  The previous inflammatory process around the right limb is no longer visualized.  MEDICAL ISSUES:   STATUS POST REPAIR OF 8 CM TYPE IV THORACOABDOMINAL ANEURYSM: The patient is doing well status post previous thoracoabdominal aneurysm repair.  His CT scan does not show any complicating factors.  I do not think routine follow-up CT scans are indicated at this point.  I will be happy to see him back at any time if any new vascular issues arise.  Deitra Mayo [facility] [phone]

## 2018-01-29 ENCOUNTER — Telehealth: Payer: Self-pay | Admitting: *Deleted

## 2018-01-29 ENCOUNTER — Telehealth: Payer: Self-pay | Admitting: Internal Medicine

## 2018-01-29 DIAGNOSIS — R911 Solitary pulmonary nodule: Secondary | ICD-10-CM | POA: Insufficient documentation

## 2018-01-29 NOTE — Telephone Encounter (Signed)
Per Dr. Nicole Cella office, pt had CT Scan and was noted to have a 73mm nodule in left lower lobe. Dr. Scot Dock calling to give FYI to MW. Pt has not been notified of these findings.  MW please review CT and advise. Thank you!

## 2018-01-29 NOTE — Telephone Encounter (Signed)
Received called report on this patient's chest CTA done yesterday by Dr. Corrie Mckusick. Patient has a new 46mm left lower lobe nodule. Patient sees Dr. Melvyn Novas for pulmonology so I have contacted his office with this finding. I also sent a message to Dr. Scot Dock regarding this change.

## 2018-01-29 NOTE — Telephone Encounter (Signed)
Placed in reminder file for f/u and discussions of limited options at [facility] 02/02/18

## 2018-01-30 ENCOUNTER — Encounter: Payer: Medicare HMO | Admitting: *Deleted

## 2018-01-30 DIAGNOSIS — J449 Chronic obstructive pulmonary disease, unspecified: Secondary | ICD-10-CM

## 2018-01-30 DIAGNOSIS — Z7984 Long term (current) use of oral hypoglycemic drugs: Secondary | ICD-10-CM | POA: Diagnosis not present

## 2018-01-30 DIAGNOSIS — E119 Type 2 diabetes mellitus without complications: Secondary | ICD-10-CM | POA: Diagnosis not present

## 2018-01-30 DIAGNOSIS — E785 Hyperlipidemia, unspecified: Secondary | ICD-10-CM | POA: Diagnosis not present

## 2018-01-30 DIAGNOSIS — I2510 Atherosclerotic heart disease of native coronary artery without angina pectoris: Secondary | ICD-10-CM | POA: Diagnosis not present

## 2018-01-30 DIAGNOSIS — Z79899 Other long term (current) drug therapy: Secondary | ICD-10-CM | POA: Diagnosis not present

## 2018-01-30 DIAGNOSIS — I10 Essential (primary) hypertension: Secondary | ICD-10-CM | POA: Diagnosis not present

## 2018-01-30 DIAGNOSIS — M1990 Unspecified osteoarthritis, unspecified site: Secondary | ICD-10-CM | POA: Diagnosis not present

## 2018-01-30 DIAGNOSIS — Z7982 Long term (current) use of aspirin: Secondary | ICD-10-CM | POA: Diagnosis not present

## 2018-01-30 DIAGNOSIS — Z9981 Dependence on supplemental oxygen: Secondary | ICD-10-CM | POA: Diagnosis not present

## 2018-01-30 LAB — GLUCOSE, CAPILLARY
Glucose-Capillary: 150 mg/dL — ABNORMAL HIGH (ref 65–99)
Glucose-Capillary: 119 mg/dL — ABNORMAL HIGH (ref 65–99)

## 2018-01-30 NOTE — Progress Notes (Signed)
Daily Session Note  Patient Details  Name: Alfred Carney MRN: [id number] Date of Birth: 30-Dec-1950 Referring Provider:     Pulmonary Rehab from 01/20/2018 in [facility]  Referring Provider  Christinia Gully MD      Encounter Date: 01/30/2018  Check In: Session Check In - 01/30/18 1220      Check-In   Location  [facility]-Cardiac & Pulmonary Rehab    Staff Present  Carson Myrtle, BS, RRT, Respiratory Lennie Hummer, MA, RCEP, CCRP, Exercise Physiologist;Alliah Boulanger Sherryll Burger, RN BSN    Supervising physician immediately available to respond to emergencies  [facility] immediately available ER MD    Physician(s)  Dr. Joni Fears and Corky Downs    Medication changes reported      No    Fall or balance concerns reported     No    Tobacco Cessation  No Change    Warm-up and Cool-down  Performed as group-led instruction    Resistance Training Performed  Yes    VAD Patient?  No      Pain Assessment   Currently in Pain?  No/denies          Social History   Tobacco Use  Smoking Status Former Smoker  . Packs/day: 1.00  . Years: 35.00  . Pack years: 35.00  . Types: Cigarettes  . Last attempt to quit: 09/16/2006  . Years since quitting: 11.3  Smokeless Tobacco Never Used    Goals Met:  Proper associated with RPD/PD & O2 Sat Independence with exercise equipment Using PLB without cueing & demonstrates good technique Exercise tolerated well No report of cardiac concerns or symptoms Strength training completed today  Goals Unmet:  Not Applicable  Comments: Pt able to follow exercise prescription today without complaint.  Will continue to monitor for progression.    Dr. Emily Filbert is Medical Director for [facility] and [facility].

## 2018-02-02 ENCOUNTER — Encounter: Payer: Self-pay | Admitting: Internal Medicine

## 2018-02-02 ENCOUNTER — Ambulatory Visit: Payer: Medicare HMO | Admitting: Internal Medicine

## 2018-02-02 VITALS — BP 134/80 | HR 70 | Ht 69.0 in | Wt 324.0 lb

## 2018-02-02 DIAGNOSIS — J449 Chronic obstructive pulmonary disease, unspecified: Secondary | ICD-10-CM

## 2018-02-02 DIAGNOSIS — R911 Solitary pulmonary nodule: Secondary | ICD-10-CM

## 2018-02-02 DIAGNOSIS — J9611 Chronic respiratory failure with hypoxia: Secondary | ICD-10-CM

## 2018-02-02 MED ORDER — UMECLIDINIUM-VILANTEROL 62.5-25 MCG/INH IN AEPB: 2.0000 | INHALATION_SPRAY | Freq: Once | RESPIRATORY_TRACT | 11 refills | Status: DC

## 2018-02-02 MED ORDER — UMECLIDINIUM-VILANTEROL 62.5-25 MCG/INH IN AEPB: 2.0000 | INHALATION_SPRAY | Freq: Once | RESPIRATORY_TRACT | 0 refills | Status: AC

## 2018-02-02 NOTE — Patient Instructions (Addendum)
Omeprazole 40 mg Take 30-60 min before first meal of the day    Add anoro one click each am - fill it if you feel it improves your activity tolerance especially at rehab    Please schedule a follow up visit in 3 months but call sooner if needed

## 2018-02-02 NOTE — Progress Notes (Signed)
Subjective:   Patient ID: Alfred Carney, male    DOB: 31-Mar-1951    MRN: [id number]    Brief patient profile:  18  yowm quit smoking 2008 with baseline weight of 240 when he stopped smoking in March of 2008 p pna with breathing only some better then  worse since summer 2015 on so referred 05/17/2015 to pulmonary clinic by Dr Helane Rima for copd eval as had been seen here in 2012 with GOLD II criteria.     History of Present Illness 05/17/2015 1st [facility] office visit/ Wert  / copd eval on ACEi  Chief Complaint  Patient presents with  . Pulmonary Consult    Referre by Dr. Helane Rima. Pt c/o SOB x 10 yrs, gradually worse over the past yr.  He states that he gets SOB with minimal exertion such as taking a shower or walking 49 yrds on flat surface.   indolent onset progressive doe but always ok at rest/  Sleeps in recliner x 10 years Has osa/ could not tol cpap and tends to fall asleep p eats Assoc with sense of nasal and throat congestion but very little cough - does have hb but controls with prn ppi Had been on several different inhalers including according to the records Spiriva but didn't think any of them really helped and stopped them years ago rec Stop lisinopril  Valsartan 80 mg one daily in place of lisinopril Prilosec Take 30- 60 min before your first and last meals of the day just until you return  GERD  Diet     06/28/2015  f/u ov/Wert re: GOLD II copd/ obesity Chief Complaint  Patient presents with  . Follow-up    PFT done today. Pt states his breathing is unchanged. He c/o cough for the past month- prod with min clear sputum.    Was some better until caught cold x 2 weeks p serving jury duty Has not tried sleeping s recliner  rec Stop hydrodiuril (thiazides contribute to gout)  Start lasix 20 mg daily as needed for swelling  - if you cut the sal out you may not need this  Symbicort 160 Take 2 puffs first thing in am and then another 2 puffs about 12  hours later.      08/15/2015  f/u ov/Wert re: uacs/ copd GOLD II/ symptoms better on ppi bid ac  Chief Complaint  Patient presents with  . Follow-up    pt following for COPD: pt states hes is doing pretty well since he has last been here. pt c/o some wheezing and SOB with exertion but states it is getting better. no c/o of cough or chest tightness.  pt states he stopped taking the symbicort he felt worse when he was on it.    Really Not limited by breathing from desired activities   rec Weight control is simply a matter of calorie balance   Think of your calorie balance like you do your bank account where in this case you want the balance to go down so you must take in less calories than you burn up.  It's just that simple:  Hard to do, but easy to understand.  Good luck!  You do not have significant limiting  copd and unlikely you ever will - pulmonary follow up is as needed     11/19/2017  Extended f/u ov/Wert re-establish for  worse sob x 3 y  Chief Complaint  Patient presents with  . Follow-up  SOB that has not gotten any better since last OV .  Has a CPAP  but unable to tolerate .   indolent gradually worsening Dyspnea: 50 ft to car down 6 steps from the deck and struggles to back x 3-4 months Cough: none Sleep: poor/ restlessness/ recliner but barely elevated / has cpap but can't tol at all  SABA use:  Not using one now  Not using prilosec regularly  rec stiolto 2 pffs each am  Wear 02 2lpm 24/7 for now Please remember to go to the lab and x-ray department downstairs in the basement  for your tests - we will call you with the results when they are available.   Please schedule a follow up office visit in 6 weeks, call sooner if needed with all medications /inhalers/ solutions in hand so we can verify exactly what you are taking. This includes all medications from all doctors and over the counters  - full pfts on return    01/05/2018  f/u ov/Wert re:   COPD II 02 2 lpm - no  better with stiolto so did not fill  / did not bring meds  Chief Complaint  Patient presents with  . Follow-up    PFT's done today.  Breathing is unchanged since the last visit.    Dyspnea:  MMRC3 = can't walk 100 yards even at a slow pace at a flat grade s stopping due to sob   Cough: none Sleep: recliner < 30 degrees due to back and breathing SABA use:  Not helping   Overt hb on ppi bid ac > for EGD by Dr Nyoka Cowden rec Only use your Albuterol as rescue  Please see patient coordinator before you leave today  to schedule pulmonary rehab at [facility]       02/02/2018  f/u ov/Wert re:  Copd II/ 02 2lpm  Chief Complaint  Patient presents with  . Follow-up    Breathing is about the same. He has good and bad days. He is using his ventolin inhaler 1-2 x per wk on average.    Dyspnea:  MMRC3 = can't walk 100 yards even at a slow pace at a flat grade s stopping due to sob  4 min x  .28 miles /level  Stopped half way due to legs  Cough: none Sleep: recliner due to back > breathing  SABA use:  Rare    No obvious day to day or daytime variability or assoc excess/ purulent sputum or mucus plugs or hemoptysis or cp or chest tightness, subjective wheeze or overt sinus or hb symptoms. No unusual exposure hx or h/o childhood pna/ asthma or knowledge of premature birth.  Sleeping  In recliner due to back   without nocturnal  or early am exacerbation  of respiratory  c/o's or need for noct saba. Also denies any obvious fluctuation of symptoms with weather or environmental changes or other aggravating or alleviating factors except as outlined above   Current Allergies, Complete Past Medical History, Past Surgical History, Family History, and Social History were reviewed in [employer] record.  ROS  The following are not active complaints unless bolded Hoarseness, sore throat, dysphagia, dental problems, itching, sneezing,  nasal congestion or discharge of excess mucus or  purulent secretions, ear ache,   fever, chills, sweats, unintended wt loss or wt gain, classically pleuritic or exertional cp,  orthopnea pnd or arm/hand swelling  or leg swelling, presyncope, palpitations, abdominal pain, anorexia, nausea, vomiting, diarrhea  or  change in bowel habits or change in bladder habits, change in stools or change in urine, dysuria, hematuria,  rash, arthralgias, visual complaints, headache, numbness, weakness or ataxia or problems with walking or coordination,  change in mood or  memory.        Current Meds  Medication Sig  . albuterol (PROVENTIL HFA;VENTOLIN HFA) 108 (90 Base) MCG/ACT inhaler Inhale 2 puffs into the lungs every 6 (six) hours as needed for wheezing or shortness of breath.  Marland Kitchen amLODipine (NORVASC) 5 MG tablet Take 5 mg by mouth daily.   . Calcium Carbonate-Simethicone (ALKA-SELTZER HEARTBURN + GAS) 750-80 MG CHEW Chew by mouth as needed.  . Cholecalciferol (VITAMIN D PO) Take 1 capsule by mouth 2 (two) times daily.  . ferrous sulfate 325 (65 FE) MG tablet Take 325 mg by mouth daily with breakfast.   . furosemide (LASIX) 20 MG tablet Take 1 tablet (20 mg total) by mouth daily as needed. (Patient taking differently: Take 20 mg by mouth daily. )  . irbesartan (AVAPRO) 300 MG tablet Take 300 mg by mouth daily.   Marland Kitchen ketoconazole (NIZORAL) 2 % shampoo Apply 1 application topically every other day.   . metFORMIN (GLUCOPHAGE) 500 MG tablet Take 500 mg by mouth 2 (two) times daily with a meal.   . metoprolol succinate (TOPROL-XL) 25 MG 24 hr tablet Take 25 mg by mouth daily.  . nitroGLYCERIN (NITROSTAT) 0.4 MG SL tablet Place 1 tablet (0.4 mg total) under the tongue every 5 (five) minutes x 3 doses as needed for chest pain.  Marland Kitchen omeprazole (PRILOSEC) 40 MG capsule Take 1 capsule (40 mg total) by mouth daily.  . OXYGEN Inhale 2 L into the lungs continuous.   . rosuvastatin (CRESTOR) 10 MG tablet Take 5 mg by mouth daily.  . tamsulosin (FLOMAX) 0.4 MG CAPS capsule Take  0.4 mg by mouth daily.             Objective:   Physical Exam     obese amb wm nad    06/28/2015        326 > 08/15/2015  323 >  11/19/2017  326 >  01/05/2018 320 > 02/02/2018    324     05/17/15 329 lb (149.233 kg)  01/19/15 322 lb (146.058 kg)  01/18/15 319 lb (144.697 kg)    Vital signs reviewed - Note on arrival 02 sats  93% on 2lpm and 4lpm with activity      HEENT: nl  turbinates bilaterally, and oropharynx. Nl external ear canals without cough reflex - full dentures    NECK :  without JVD/Nodes/TM/ nl carotid upstrokes bilaterally   LUNGS: no acc muscle use,  Nl contour chest which is clear to A and P bilaterally without cough on insp or exp maneuvers   CV:  RRR  no s3 or murmur or increase in P2, and - 2 + pitting both LE's  ABD:  Tensely obese with limited inspiratory excursion in the supine position. No bruits or organomegaly appreciated, bowel sounds nl  MS:  Nl gait/ ext warm without deformities, calf tenderness, cyanosis or clubbing No obvious joint restrictions   SKIN: warm and dry without lesions    NEURO:  alert, approp, nl sensorium with  no motor or cerebellar deficits apparent.         I personally reviewed images and agree with radiology impression as follows:   Chest CT 01/29/18 Geographic ground-glass of the lower lungs. New nodule of the left lower  lobe measuring 13 mm.             Assessment & Plan:

## 2018-02-03 ENCOUNTER — Encounter: Payer: Self-pay | Admitting: Internal Medicine

## 2018-02-03 NOTE — Assessment & Plan Note (Signed)
pfts 06/28/2015 with restrictive component with erv 41  Body mass index is 47.85 kg/m.  -  trending up but likely mostly fluid related Lab Results  Component Value Date   TSH 3.74 11/19/2017     Contributing to gerd risk/ doe/reviewed the need and the process to achieve and maintain neg calorie balance > defer f/u primary care including intermittently monitoring thyroid status

## 2018-02-03 NOTE — Assessment & Plan Note (Signed)
2lpm and 4lpm with activity

## 2018-02-03 NOTE — Assessment & Plan Note (Signed)
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 - 05/17/2015  Walked RA  2 laps @ 185 ft each stopped due to  Sob/ desat at nl pace to 83%   - trial off acei 05/17/2015  - PFT's  06/28/2015  FEV1 1.67 (51 % ) ratio 55  p 13 % improvement from saba with DLCO  47 % corrects to 66 % for alv volume  - rec trial of symbicort 06/28/15 > coughing / breathing worse so stopped it   - 08/15/2015  Walked RA x 3 laps @ 185 ft each stopped due to End of study, nl pace, min sob/  desat  To 88%  - sats 81% at rest and not acutely ill 11/19/2017 > placed on 2lpm (see separate a/p)  - Spirometry 11/19/2017  FEV1 1.60 (49%)  Ratio 59 with classic curvature - 11/19/2017  After extensive coaching inhaler device  effectiveness =    90% with smi > trial of stiolto   PFT's  01/05/2018  FEV1 1.66 (52 % ) ratio 60  p 6 % improvement from saba p 6 prior to study with DLCO  38 % corrects to 56  % for alv volume   - 01/05/2018  Referred to rehab [place] - Anoro trial 02/02/2018   Pt is Group B in terms of symptom/risk and laba/lama therefore appropriate rx at this point but note previously did not thing stiolto helped so anoro may not do much better but at this point nothing else to offer but rehab/ 02 and wt loss    I had an extended discussion with the patient reviewing all relevant studies completed to date and  lasting 15 to 20 minutes of a 25 minute visit    See device teaching which extended face to face time for this visit.  Each maintenance medication was reviewed in detail including emphasizing most importantly the difference between maintenance and prns and under what circumstances the prns are to be triggered using an action plan format that is not reflected in the computer generated alphabetically organized AVS which I have not found useful in most complex patients, especially with respiratory illnesses  Please  see AVS for specific instructions unique to this visit that I personally wrote and verbalized to the the pt in detail and then reviewed with pt  by my nurse highlighting any  changes in therapy recommended at today's visit to their plan of care.

## 2018-02-03 NOTE — Assessment & Plan Note (Signed)
CT chest 01/28/18  New 13 mm nodule at the left lung base   Although there are clearly abnormalities on CT scan, they should probably be considered "microscopic"at this point   In the setting of obvious "macroscopic" health issues,  I am very reluctatnt to embark on an invasive w/u at this point but will arrange consevative  follow up and in the meantime see what we can do to address the patient's subjective concerns.    Discussed in detail all the  indications, usual  risks and alternatives  relative to the benefits with patient who agrees to proceed with conservative f/u as outlined  With ov in 3 months to see if breathing any better and consider PET/ navigational bx (vs empiric RT) at that time but clearly not surgical candidate for LLL surgery of any kind

## 2018-02-04 ENCOUNTER — Encounter: Payer: Medicare HMO | Admitting: *Deleted

## 2018-02-04 DIAGNOSIS — Z7984 Long term (current) use of oral hypoglycemic drugs: Secondary | ICD-10-CM | POA: Diagnosis not present

## 2018-02-04 DIAGNOSIS — Z9981 Dependence on supplemental oxygen: Secondary | ICD-10-CM | POA: Diagnosis not present

## 2018-02-04 DIAGNOSIS — I2510 Atherosclerotic heart disease of native coronary artery without angina pectoris: Secondary | ICD-10-CM | POA: Diagnosis not present

## 2018-02-04 DIAGNOSIS — M1990 Unspecified osteoarthritis, unspecified site: Secondary | ICD-10-CM | POA: Diagnosis not present

## 2018-02-04 DIAGNOSIS — E785 Hyperlipidemia, unspecified: Secondary | ICD-10-CM | POA: Diagnosis not present

## 2018-02-04 DIAGNOSIS — Z7982 Long term (current) use of aspirin: Secondary | ICD-10-CM | POA: Diagnosis not present

## 2018-02-04 DIAGNOSIS — J449 Chronic obstructive pulmonary disease, unspecified: Secondary | ICD-10-CM

## 2018-02-04 DIAGNOSIS — Z79899 Other long term (current) drug therapy: Secondary | ICD-10-CM | POA: Diagnosis not present

## 2018-02-04 DIAGNOSIS — E119 Type 2 diabetes mellitus without complications: Secondary | ICD-10-CM | POA: Diagnosis not present

## 2018-02-04 DIAGNOSIS — I10 Essential (primary) hypertension: Secondary | ICD-10-CM | POA: Diagnosis not present

## 2018-02-04 LAB — GLUCOSE, CAPILLARY
Glucose-Capillary: 157 mg/dL — ABNORMAL HIGH (ref 65–99)
Glucose-Capillary: 111 mg/dL — ABNORMAL HIGH (ref 65–99)

## 2018-02-04 NOTE — Progress Notes (Signed)
Daily Session Note  Patient Details  Name: Alfred Carney MRN: [id number] Date of Birth: 05/04/1951 Referring Provider:     Pulmonary Rehab from 01/20/2018 in [facility] Card[facility] and Pulmonary Rehab  Referring Provider  Christinia Gully MD      Encounter Date: 02/04/2018  Check In: Session Check In - 02/04/18 1126      Check-In   Location  [facility]-Cardiac & Pulmonary Rehab    Staff Present  Alberteen Sam, MA, RCEP, CCRP, Exercise Physiologist;Tanette Chauca Sherryll Burger, RN BSN    Supervising physician immediately available to respond to emergencies  [facility] immediately available ER MD    Physician(s)  Dr. Alfred Levins and Mariea Clonts    Medication changes reported      No    Fall or balance concerns reported     No    Tobacco Cessation  No Change    Warm-up and Cool-down  Performed as group-led instruction    Resistance Training Performed  Yes    VAD Patient?  No      Pain Assessment   Currently in Pain?  No/denies        Exercise Prescription Changes - 02/03/18 1400      Response to Exercise   Blood Pressure (Admit)  134/84    Blood Pressure (Exercise)  160/76    Blood Pressure (Exit)  124/74    Heart Rate (Admit)  67 bpm    Heart Rate (Exercise)  112 bpm    Heart Rate (Exit)  70 bpm    Oxygen Saturation (Admit)  95 %    Oxygen Saturation (Exercise)  88 %    Oxygen Saturation (Exit)  94 %    Rating of Perceived Exertion (Exercise)  15    Perceived Dyspnea (Exercise)  3    Symptoms  SOB    Comments  second full day of exercise    Duration  Progress to 45 minutes of aerobic exercise without signs/symptoms of physical distress    Intensity  THRR unchanged      Progression   Progression  Continue to progress workloads to maintain intensity without signs/symptoms of physical distress.    Average METs  1.83      Resistance Training   Training Prescription  Yes    Weight  3 lbs    Reps  10-15      Interval Training   Interval Training  No      Oxygen   Oxygen  Continuous    Liters  4      Treadmill   MPH  0.8    Grade  0    Minutes  6 4 min, 2 min    METs  1.6      T5 Nustep   Level  1    SPM  78    Minutes  15    METs  1.9      Biostep-RELP   Level  1    SPM  44    Minutes  15    METs  2       Social History   Tobacco Use  Smoking Status Former Smoker  . Packs/day: 1.00  . Years: 35.00  . Pack years: 35.00  . Types: Cigarettes  . Last attempt to quit: 09/16/2006  . Years since quitting: 11.3  Smokeless Tobacco Never Used    Goals Met:  Proper associated with RPD/PD & O2 Sat Independence with exercise equipment Using PLB without cueing & demonstrates good technique Exercise tolerated well No report  of cardiac concerns or symptoms Strength training completed today  Goals Unmet:  Not Applicable  Comments: Pt able to follow exercise prescription today without complaint.  Will continue to monitor for progression.    Dr. Emily Filbert is Medical Director for [facility] and [facility].

## 2018-02-10 ENCOUNTER — Encounter: Payer: Self-pay | Admitting: Gastroenterology

## 2018-02-16 ENCOUNTER — Encounter: Payer: Medicare HMO | Attending: Internal Medicine | Admitting: *Deleted

## 2018-02-16 DIAGNOSIS — D509 Iron deficiency anemia, unspecified: Secondary | ICD-10-CM | POA: Diagnosis not present

## 2018-02-16 DIAGNOSIS — Z6841 Body Mass Index (BMI) 40.0 and over, adult: Secondary | ICD-10-CM | POA: Diagnosis not present

## 2018-02-16 DIAGNOSIS — Z9981 Dependence on supplemental oxygen: Secondary | ICD-10-CM | POA: Insufficient documentation

## 2018-02-16 DIAGNOSIS — E785 Hyperlipidemia, unspecified: Secondary | ICD-10-CM | POA: Insufficient documentation

## 2018-02-16 DIAGNOSIS — M1990 Unspecified osteoarthritis, unspecified site: Secondary | ICD-10-CM | POA: Insufficient documentation

## 2018-02-16 DIAGNOSIS — I10 Essential (primary) hypertension: Secondary | ICD-10-CM | POA: Diagnosis not present

## 2018-02-16 DIAGNOSIS — Z87891 Personal history of nicotine dependence: Secondary | ICD-10-CM | POA: Insufficient documentation

## 2018-02-16 DIAGNOSIS — Z8679 Personal history of other diseases of the circulatory system: Secondary | ICD-10-CM | POA: Diagnosis not present

## 2018-02-16 DIAGNOSIS — Z85828 Personal history of other malignant neoplasm of skin: Secondary | ICD-10-CM | POA: Insufficient documentation

## 2018-02-16 DIAGNOSIS — I2510 Atherosclerotic heart disease of native coronary artery without angina pectoris: Secondary | ICD-10-CM | POA: Insufficient documentation

## 2018-02-16 DIAGNOSIS — K219 Gastro-esophageal reflux disease without esophagitis: Secondary | ICD-10-CM | POA: Diagnosis not present

## 2018-02-16 DIAGNOSIS — Z7982 Long term (current) use of aspirin: Secondary | ICD-10-CM | POA: Diagnosis not present

## 2018-02-16 DIAGNOSIS — J449 Chronic obstructive pulmonary disease, unspecified: Secondary | ICD-10-CM | POA: Diagnosis not present

## 2018-02-16 DIAGNOSIS — Z7984 Long term (current) use of oral hypoglycemic drugs: Secondary | ICD-10-CM | POA: Insufficient documentation

## 2018-02-16 DIAGNOSIS — E119 Type 2 diabetes mellitus without complications: Secondary | ICD-10-CM | POA: Insufficient documentation

## 2018-02-16 DIAGNOSIS — Z79899 Other long term (current) drug therapy: Secondary | ICD-10-CM | POA: Diagnosis not present

## 2018-02-16 NOTE — Progress Notes (Signed)
Pulmonary Individual Treatment Plan  Patient Details  Name: Alfred Carney MRN: [id number] Date of Birth: [birth year] Referring Provider:     Pulmonary Rehab from 01/20/2018 in [facility] Cardiac and Pulmonary Rehab  Referring Provider  Christinia Gully MD      Initial Encounter Date:    Pulmonary Rehab from 01/20/2018 in [facility] Cardiac and Pulmonary Rehab  Date  01/20/18  Referring Provider  Christinia Gully MD      Visit Diagnosis: Chronic obstructive pulmonary disease, unspecified COPD type ([place])  Patient's Home Medications on Admission:  Current Outpatient Medications:  .  albuterol (PROVENTIL HFA;VENTOLIN HFA) 108 (90 Base) MCG/ACT inhaler, Inhale 2 puffs into the lungs every 6 (six) hours as needed for wheezing or shortness of breath., Disp: 1 Inhaler, Rfl: 6 .  amLODipine (NORVASC) 5 MG tablet, Take 5 mg by mouth daily. , Disp: , Rfl:  .  Calcium Carbonate-Simethicone (ALKA-SELTZER HEARTBURN + GAS) 750-80 MG CHEW, Chew by mouth as needed., Disp: , Rfl:  .  Cholecalciferol (VITAMIN D PO), Take 1 capsule by mouth 2 (two) times daily., Disp: , Rfl:  .  ferrous sulfate 325 (65 FE) MG tablet, Take 325 mg by mouth daily with breakfast. , Disp: , Rfl:  .  furosemide (LASIX) 20 MG tablet, Take 1 tablet (20 mg total) by mouth daily as needed. (Patient taking differently: Take 20 mg by mouth daily. ), Disp: 30 tablet, Rfl: 11 .  irbesartan (AVAPRO) 300 MG tablet, Take 300 mg by mouth daily. , Disp: , Rfl:  .  ketoconazole (NIZORAL) 2 % shampoo, Apply 1 application topically every other day. , Disp: , Rfl:  .  metFORMIN (GLUCOPHAGE) 500 MG tablet, Take 500 mg by mouth 2 (two) times daily with a meal. , Disp: , Rfl:  .  metoprolol succinate (TOPROL-XL) 25 MG 24 hr tablet, Take 25 mg by mouth daily., Disp: , Rfl:  .  nitroGLYCERIN (NITROSTAT) 0.4 MG SL tablet, Place 1 tablet (0.4 mg total) under the tongue every 5 (five) minutes x 3 doses as needed for chest pain., Disp: 25 tablet, Rfl: 4 .  omeprazole  (PRILOSEC) 40 MG capsule, Take 1 capsule (40 mg total) by mouth daily., Disp: 90 capsule, Rfl: 3 .  OXYGEN, Inhale 2 L into the lungs continuous. , Disp: , Rfl:  .  rosuvastatin (CRESTOR) 10 MG tablet, Take 5 mg by mouth daily., Disp: , Rfl:  .  tamsulosin (FLOMAX) 0.4 MG CAPS capsule, Take 0.4 mg by mouth daily. , Disp: , Rfl:   Past Medical History: Past Medical History:  Diagnosis Date  . Chronic obstructive pulmonary disease ([place]) 2008   Moderate  . Colon polyps   . Community acquired pneumonia   . Coronary artery disease   . Degenerative joint disease   . Diabetes mellitus without complication ([place])    type 2  . Emphysema lung ([place])   . Enlarged liver    fatty liver by '09 CT  . Gastroesophageal reflux disease   . Gout   . H/O hiatal hernia   . History of [place] spotted fever    Possible history of [place] Spotted Fever  . Hyperlipidemia   . Hypertension   . Hypokalemia    diuretic induced, resolved  . Microcytic anemia    iron pills  . Morbid obesity ([place])   . Shortness of breath   . Skin cancer    skin cancer lip removed  . Sleep apnea    not currently using CPAP  05/20/13  . 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 Packs/day: 1.00  . Years: 35.00  . Pack years: 35.00  . Types: Cigarettes  . Last attempt to quit: 09/16/2006  . Years since quitting: 11.4  Smokeless Tobacco Never Used    Labs: Recent Chemical engineer    Labs for ITP Cardiac and Pulmonary Rehab Latest Ref Rng & Units 10/15/2011 01/14/2012 01/29/2014 05/08/2014   Cholestrol 0 - 200 mg/dL 167 82 88 -   LDLCALC 0 - 99 mg/dL 104(H) 25 32 -   HDL >39 mg/dL 41.00 36.40(L) 30(L) -   Trlycerides <150 mg/dL 112.0 102.0 130 -   Hemoglobin A1c <5.7 % - - - 6.6(H)       Pulmonary Assessment Scores: Pulmonary Assessment Scores    Row Name 01/20/18 1502         ADL UCSD     ADL Phase  Entry     SOB Score total  88     Rest  1     Walk  4     Stairs  4     Bath  5     Dress  3     Shop  5       CAT Score   CAT Score  23       mMRC Score   mMRC Score  4        Pulmonary Function Assessment: Pulmonary Function Assessment - 01/20/18 1439      Pulmonary Function Tests   FVC%  64 %    FEV1%  52 %      Breath   Bilateral Breath Sounds  Clear;Decreased    Shortness of Breath  Yes;Limiting activity       Exercise Target Goals:    Exercise Program Goal: Individual exercise prescription set using results from initial 6 min walk test and THRR while considering  patient's activity barriers and safety.    Exercise Prescription Goal: Initial exercise prescription builds to 30-45 minutes a day of aerobic activity, 2-3 days per week.  Home exercise guidelines will be given to patient during program as part of exercise prescription that the participant will acknowledge.  Activity Barriers & Risk Stratification: Activity Barriers & Cardiac Risk Stratification - 01/20/18 1531      Activity Barriers & Cardiac Risk Stratification   Activity Barriers  Deconditioning;Muscular Weakness;Balance Concerns;Shortness of Breath;History of Falls;Assistive Device uses cane for balance       6 Minute Walk: 6 Minute Walk    Row Name 01/20/18 1526         6 Minute Walk   Phase  Initial     Distance  142 feet     Walk Time  1.05 minutes test terminated due to desaturation     # of Rest Breaks  0     MPH  1.54     METS  0.09     RPE  13     Perceived Dyspnea   2     VO2 Peak  0.32     Symptoms  Yes (comment)     Comments  SOB, uses cane for balance     Resting HR  70 bpm     Resting BP  136/64     Resting Oxygen Saturation   90 %     Exercise Oxygen Saturation  during 6 min walk  73 %     Max Ex. HR  100 bpm     Max Ex. BP  168/74     2 Minute Post BP  156/70 140/74       Interval HR   1 Minute HR  100     2 Minute HR  99 test stopped at 1:03      3 Minute HR  91     4 Minute HR  77     6 Minute HR  68     Interval Heart Rate?  Yes       Interval Oxygen   Interval Oxygen?  Yes     Baseline Oxygen Saturation %  90 %     1 Minute Oxygen Saturation %  80 % test terminated at 1:03 73%     1 Minute Liters of Oxygen  2 L continuous     2 Minute Oxygen Saturation %  87 %     2 Minute Liters of Oxygen  2 L     4 Minute Oxygen Saturation %  95 %     4 Minute Liters of Oxygen  2 L     6 Minute Oxygen Saturation %  94 %     6 Minute Liters of Oxygen  2 L       Oxygen Initial Assessment: Oxygen Initial Assessment - 01/20/18 1441      Home Oxygen   Home Oxygen Device  Home Concentrator;E-Tanks    Sleep Oxygen Prescription  CPAP;Continuous    Liters per minute  2    Home Exercise Oxygen Prescription  Continuous    Liters per minute  2    Home at Rest Exercise Oxygen Prescription  Continuous    Liters per minute  2    Compliance with Home Oxygen Use  No does not wear CPAP      Initial 6 min Walk   Oxygen Used  Continuous    Liters per minute  2      Program Oxygen Prescription   Program Oxygen Prescription  Continuous    Liters per minute  2      Intervention   Short Term Goals  To learn and understand importance of monitoring SPO2 with pulse oximeter and demonstrate accurate use of the pulse oximeter.;To learn and demonstrate proper pursed lip breathing techniques or other breathing techniques.;To learn and demonstrate proper use of respiratory medications;To learn and understand importance of maintaining oxygen saturations>88%;To learn and exhibit compliance with exercise, home and travel O2 prescription    Long  Term Goals  Exhibits compliance with exercise, home and travel O2 prescription;Verbalizes importance of monitoring SPO2 with pulse oximeter and return demonstration;Maintenance of O2 saturations>88%;Exhibits proper breathing techniques, such as pursed lip breathing or other method taught during program  session;Compliance with respiratory medication;Demonstrates proper use of MDI's       Oxygen Re-Evaluation:   Oxygen Discharge (Final Oxygen Re-Evaluation):   Initial Exercise Prescription: Initial Exercise Prescription - 01/20/18 1500      Date of Initial Exercise RX and Referring Provider   Date  01/20/18    Referring Provider  Christinia Gully MD      Oxygen   Oxygen  Continuous    Liters  4      Treadmill   MPH  0.8    Grade  0    Minutes  15    METs  1.6      T5 Nustep  Level  1    SPM  80    Minutes  15    METs  1.5      Biostep-RELP   Level  1    SPM  40    Minutes  15    METs  2      Prescription Details   Frequency (times per week)  3    Duration  Progress to 45 minutes of aerobic exercise without signs/symptoms of physical distress      Intensity   THRR 40-80% of Max Heartrate  103-136    Ratings of Perceived Exertion  11-13    Perceived Dyspnea  0-4      Progression   Progression  Continue to progress workloads to maintain intensity without signs/symptoms of physical distress.      Resistance Training   Training Prescription  Yes    Weight  3 lbs    Reps  10-15       Perform Capillary Blood Glucose checks as needed.  Exercise Prescription Changes: Exercise Prescription Changes    Row Name 01/20/18 1500 02/03/18 1400           Response to Exercise   Blood Pressure (Admit)  136/64  134/84      Blood Pressure (Exercise)  168/74  160/76      Blood Pressure (Exit)  140/74  124/74      Heart Rate (Admit)  70 bpm  67 bpm      Heart Rate (Exercise)  100 bpm  112 bpm      Heart Rate (Exit)  68 bpm  70 bpm      Oxygen Saturation (Admit)  90 %  95 %      Oxygen Saturation (Exercise)  73 %  88 %      Oxygen Saturation (Exit)  94 %  94 %      Rating of Perceived Exertion (Exercise)  13  15      Perceived Dyspnea (Exercise)  2  3      Symptoms  SOB  SOB      Comments  walk test results, uses cane  second full day of exercise      Duration  -   Progress to 45 minutes of aerobic exercise without signs/symptoms of physical distress      Intensity  -  THRR unchanged        Progression   Progression  -  Continue to progress workloads to maintain intensity without signs/symptoms of physical distress.      Average METs  -  1.83        Resistance Training   Training Prescription  -  Yes      Weight  -  3 lbs      Reps  -  10-15        Interval Training   Interval Training  -  No        Oxygen   Oxygen  -  Continuous      Liters  -  4        Treadmill   MPH  -  0.8      Grade  -  0      Minutes  -  6 4 min, 2 min      METs  -  1.6        T5 Nustep   Level  -  1      SPM  -  78  Minutes  -  15      METs  -  1.9        Biostep-RELP   Level  -  1      SPM  -  44      Minutes  -  15      METs  -  2         Exercise Comments: Exercise Comments    Row Name 01/26/18 1146           Exercise Comments  First full day of exercise!  Patient was oriented to gym and equipment including functions, settings, policies, and procedures.  Patient's individual exercise prescription and treatment plan were reviewed.  All starting workloads were established based on the results of the 6 minute walk test done at initial orientation visit.  The plan for exercise progression was also introduced and progression will be customized based on patient's performance and goals.          Exercise Goals and Review: Exercise Goals    Row Name 01/20/18 1539             Exercise Goals   Increase Physical Activity  Yes       Intervention  Provide advice, education, support and counseling about physical activity/exercise needs.;Develop an individualized exercise prescription for aerobic and resistive training based on initial evaluation findings, risk stratification, comorbidities and participant's personal goals.       Expected Outcomes  Short Term: Attend rehab on a regular basis to increase amount of physical activity.;Long Term: Add in  home exercise to make exercise part of routine and to increase amount of physical activity.;Long Term: Exercising regularly at least 3-5 days a week.       Increase Strength and Stamina  Yes       Intervention  Provide advice, education, support and counseling about physical activity/exercise needs.;Develop an individualized exercise prescription for aerobic and resistive training based on initial evaluation findings, risk stratification, comorbidities and participant's personal goals.       Expected Outcomes  Short Term: Increase workloads from initial exercise prescription for resistance, speed, and METs.;Short Term: Perform resistance training exercises routinely during rehab and add in resistance training at home;Long Term: Improve cardiorespiratory fitness, muscular endurance and strength as measured by increased METs and functional capacity (6MWT)       Able to understand and use rate of perceived exertion (RPE) scale  Yes       Intervention  Provide education and explanation on how to use RPE scale       Expected Outcomes  Short Term: Able to use RPE daily in rehab to express subjective intensity level;Long Term:  Able to use RPE to guide intensity level when exercising independently       Able to understand and use Dyspnea scale  Yes       Intervention  Provide education and explanation on how to use Dyspnea scale       Expected Outcomes  Short Term: Able to use Dyspnea scale daily in rehab to express subjective sense of shortness of breath during exertion;Long Term: Able to use Dyspnea scale to guide intensity level when exercising independently       Knowledge and understanding of Target Heart Rate Range (THRR)  Yes       Intervention  Provide education and explanation of THRR including how the numbers were predicted and where they are located for reference       Expected Outcomes  Short Term: Able to state/look up THRR;Short Term: Able to use daily as guideline for intensity in rehab;Long  Term: Able to use THRR to govern intensity when exercising independently       Able to check pulse independently  Yes       Intervention  Provide education and demonstration on how to check pulse in carotid and radial arteries.;Review the importance of being able to check your own pulse for safety during independent exercise       Expected Outcomes  Short Term: Able to explain why pulse checking is important during independent exercise;Long Term: Able to check pulse independently and accurately       Understanding of Exercise Prescription  Yes       Intervention  Provide education, explanation, and written materials on patient's individual exercise prescription       Expected Outcomes  Short Term: Able to explain program exercise prescription;Long Term: Able to explain home exercise prescription to exercise independently          Exercise Goals Re-Evaluation : Exercise Goals Re-Evaluation    [place] Name 01/26/18 1146 02/03/18 1439           Exercise Goal Re-Evaluation   Exercise Goals Review  Increase Physical Activity;Able to understand and use Dyspnea scale;Increase Strength and Stamina;Knowledge and understanding of Target Heart Rate Range (THRR);Able to understand and use rate of perceived exertion (RPE) scale  Increase Physical Activity;Understanding of Exercise Prescription;Increase Strength and Stamina      Comments  Reviewed RPE scale, THR and program prescription with pt today.  Pt voiced understanding and was given a copy of goals to take home.   Tysin is off to a good start in rehab.  He has only attend two full days of exercise due to other medical appointments he had scheduled. He will do well if he is able to come consistently to class each day.  We will continue to monitor his progress.       Expected Outcomes  Short: Use RPE daily to regulate intensity.  Long: Follow program prescription in THR.  Short: Attend rehab regularly.  Long: Continue to follow program prescription.           Discharge Exercise Prescription (Final Exercise Prescription Changes): Exercise Prescription Changes - 02/03/18 1400      Response to Exercise   Blood Pressure (Admit)  134/84    Blood Pressure (Exercise)  160/76    Blood Pressure (Exit)  124/74    Heart Rate (Admit)  67 bpm    Heart Rate (Exercise)  112 bpm    Heart Rate (Exit)  70 bpm    Oxygen Saturation (Admit)  95 %    Oxygen Saturation (Exercise)  88 %    Oxygen Saturation (Exit)  94 %    Rating of Perceived Exertion (Exercise)  15    Perceived Dyspnea (Exercise)  3    Symptoms  SOB    Comments  second full day of exercise    Duration  Progress to 45 minutes of aerobic exercise without signs/symptoms of physical distress    Intensity  THRR unchanged      Progression   Progression  Continue to progress workloads to maintain intensity without signs/symptoms of physical distress.    Average METs  1.83      Resistance Training   Training Prescription  Yes    Weight  3 lbs    Reps  10-15      Interval Training  Interval Training  No      Oxygen   Oxygen  Continuous    Liters  4      Treadmill   MPH  0.8    Grade  0    Minutes  6 4 min, 2 min    METs  1.6      T5 Nustep   Level  1    SPM  78    Minutes  15    METs  1.9      Biostep-RELP   Level  1    SPM  44    Minutes  15    METs  2       Nutrition:  Target Goals: Understanding of nutrition guidelines, daily intake of sodium <1551m, cholesterol <2061m calories 30% from fat and 7% or less from saturated fats, daily to have 5 or more servings of fruits and vegetables.  Biometrics: Pre Biometrics - 01/20/18 1540      Pre Biometrics   Height  5' 9"  (1.753 m)    Weight  318 lb 14.4 oz (144.7 kg)  (Abnormal)     Waist Circumference  56.5 inches    Hip Circumference  46.5 inches    Waist to Hip Ratio  1.22 %    BMI (Calculated)  47.07    Single Leg Stand  1.93 seconds        Nutrition Therapy Plan and Nutrition Goals: Nutrition Therapy &  Goals - 01/20/18 1439      Personal Nutrition Goals   Comments  Patient wants to lose weight, breath better and learn to eat a better diet. He is lacking energy      Intervention Plan   Intervention  Prescribe, educate and counsel regarding individualized specific dietary modifications aiming towards targeted core components such as weight, hypertension, lipid management, diabetes, heart failure and other comorbidities.;Nutrition handout(s) given to patient.    Expected Outcomes  Short Term Goal: Understand basic principles of dietary content, such as calories, fat, sodium, cholesterol and nutrients.;Long Term Goal: Adherence to prescribed nutrition plan.       Nutrition Assessments:   Nutrition Goals Re-Evaluation:   Nutrition Goals Discharge (Final Nutrition Goals Re-Evaluation):   Psychosocial: Target Goals: Acknowledge presence or absence of significant depression and/or stress, maximize coping skills, provide positive support system. Participant is able to verbalize types and ability to use techniques and skills needed for reducing stress and depression.   Initial Review & Psychosocial Screening: Initial Psych Review & Screening - 01/20/18 1437      Initial Review   Current issues with  Current Sleep Concerns;Current Stress Concerns    Source of Stress Concerns  Chronic Illness;Unable to perform yard/household activities    Comments  COPD is most stressful issue      [place] Yes    Comments  He can look to his wife and children for support.      Barriers   Psychosocial barriers to participate in program  The patient should benefit from training in stress management and relaxation.      Screening Interventions   Interventions  Encouraged to exercise;Program counselor consult;To provide support and resources with identified psychosocial needs;Provide feedback about the scores to participant    Expected Outcomes  Short Term goal: Utilizing  psychosocial counselor, staff and physician to assist with identification of specific Stressors or current issues interfering with healing process. Setting desired goal for each stressor or current issue identified.;Long  Term Goal: Stressors or current issues are controlled or eliminated.;Short Term goal: Identification and review with participant of any Quality of Life or Depression concerns found by scoring the questionnaire.;Long Term goal: The participant improves quality of Life and PHQ9 Scores as seen by post scores and/or verbalization of changes       Quality of Life Scores:  Scores of 19 and below usually indicate a poorer quality of life in these areas.  A difference of  2-3 points is a clinically meaningful difference.  A difference of 2-3 points in the total score of the Quality of Life Index has been associated with significant improvement in overall quality of life, self-image, physical symptoms, and general health in studies assessing change in quality of life.  PHQ-9: Recent Review Flowsheet Data    Depression screen [facility] 2/9 01/20/2018 01/19/2015 07/19/2014 05/31/2014 05/31/2014   Decreased Interest 1 0 0 0 0   Down, Depressed, Hopeless 0 0 0 0 0   PHQ - 2 Score 1 0 0 0 0   Altered sleeping 1 - - - -   Tired, decreased energy 2 - - - -   Change in appetite 0 - - - -   Feeling bad or failure about yourself  1 - - - -   Trouble concentrating 2 - - - -   Moving slowly or fidgety/restless 0 - - - -   Suicidal thoughts 0 - - - -   PHQ-9 Score 7 - - - -   Difficult doing work/chores Somewhat difficult - - - -     Interpretation of Total Score  Total Score Depression Severity:  1-4 = Minimal depression, 5-9 = Mild depression, 10-14 = Moderate depression, 15-19 = Moderately severe depression, 20-27 = Severe depression   Psychosocial Evaluation and Intervention:   Psychosocial Re-Evaluation:   Psychosocial Discharge (Final Psychosocial Re-Evaluation):   Education: Education  Goals: Education classes will be provided on a weekly basis, covering required topics. Participant will state understanding/return demonstration of topics presented.  Learning Barriers/Preferences: Learning Barriers/Preferences - 01/20/18 1441      Learning Barriers/Preferences   Learning Barriers  Hearing    Learning Preferences  None       Education Topics:  Initial Evaluation Education: - Verbal, written and demonstration of respiratory meds, oximetry and breathing techniques. Instruction on use of nebulizers and MDIs and importance of monitoring MDI activations.   Pulmonary Rehab from 02/04/2018 in [facility] Cardiac and Pulmonary Rehab  Date  01/20/18  Educator  [facility]  Instruction Review Code  1- Verbalizes Understanding      General Nutrition Guidelines/Fats and Fiber: -Group instruction provided by verbal, written material, models and posters to present the general guidelines for heart healthy nutrition. Gives an explanation and review of dietary fats and fiber.   Controlling Sodium/Reading Food Labels: -Group verbal and written material supporting the discussion of sodium use in heart healthy nutrition. Review and explanation with models, verbal and written materials for utilization of the food label.   Exercise Physiology & General Exercise Guidelines: - Group verbal and written instruction with models to review the exercise physiology of the cardiovascular system and associated critical values. Provides general exercise guidelines with specific guidelines to those with heart or lung disease.    Aerobic Exercise & Resistance Training: - Gives group verbal and written instruction on the various components of exercise. Focuses on aerobic and resistive training programs and the benefits of this training and how to safely progress through these programs.  Flexibility, Balance, Mind/Body Relaxation: Provides group verbal/written instruction on the benefits of flexibility and balance  training, including mind/body exercise modes such as yoga, pilates and tai chi.  Demonstration and skill practice provided.   Stress and Anxiety: - Provides group verbal and written instruction about the health risks of elevated stress and causes of high stress.  Discuss the correlation between heart/lung disease and anxiety and treatment options. Review healthy ways to manage with stress and anxiety.   Depression: - Provides group verbal and written instruction on the correlation between heart/lung disease and depressed mood, treatment options, and the stigmas associated with seeking treatment.   Exercise & Equipment Safety: - Individual verbal instruction and demonstration of equipment use and safety with use of the equipment.   Pulmonary Rehab from 02/04/2018 in [facility] Cardiac and Pulmonary Rehab  Date  01/20/18  Educator  [facility]  Instruction Review Code  1- Verbalizes Understanding      Infection Prevention: - Provides verbal and written material to individual with discussion of infection control including proper hand washing and proper equipment cleaning during exercise session.   Pulmonary Rehab from 02/04/2018 in [facility] Cardiac and Pulmonary Rehab  Date  01/20/18  Educator  [facility]  Instruction Review Code  1- Verbalizes Understanding      Falls Prevention: - Provides verbal and written material to individual with discussion of falls prevention and safety.   Pulmonary Rehab from 02/04/2018 in [facility] Cardiac and Pulmonary Rehab  Date  01/20/18  Educator  [facility]  Instruction Review Code  1- Verbalizes Understanding      Diabetes: - Individual verbal and written instruction to review signs/symptoms of diabetes, desired ranges of glucose level fasting, after meals and with exercise. Advice that pre and post exercise glucose checks will be done for 3 sessions at entry of program.   Chronic Lung Diseases: - Group verbal and written instruction to review updates, respiratory medications,  advancements in procedures and treatments. Discuss use of supplemental oxygen including available portable oxygen systems, continuous and intermittent flow rates, concentrators, personal use and safety guidelines. Review proper use of inhaler and spacers. Provide informative websites for self-education.    Energy Conservation: - Provide group verbal and written instruction for methods to conserve energy, plan and organize activities. Instruct on pacing techniques, use of adaptive equipment and posture/positioning to relieve shortness of breath.   Pulmonary Rehab from 02/04/2018 in [facility] Cardiac and Pulmonary Rehab  Date  02/04/18  Educator  [facility]  Instruction Review Code  1- Verbalizes Understanding      Triggers and Exacerbations: - Group verbal and written instruction to review types of environmental triggers and ways to prevent exacerbations. Discuss weather changes, air quality and the benefits of nasal washing. Review warning signs and symptoms to help prevent infections. Discuss techniques for effective airway clearance, coughing, and vibrations.   AED/CPR: - Group verbal and written instruction with the use of models to demonstrate the basic use of the AED with the basic ABC's of resuscitation.   Anatomy and Physiology of the Lungs: - Group verbal and written instruction with the use of models to provide basic lung anatomy and physiology related to function, structure and complications of lung disease.   Anatomy & Physiology of the Heart: - Group verbal and written instruction and models provide basic cardiac anatomy and physiology, with the coronary electrical and arterial systems. Review of Valvular disease and Heart Failure   Cardiac Medications: - Group verbal and written instruction to review commonly prescribed medications for  heart disease. Reviews the medication, class of the drug, and side effects.   Know Your Numbers and Risk Factors: -Group verbal and written instruction  about important numbers in your health.  Discussion of what are risk factors and how they play a role in the disease process.  Review of Cholesterol, Blood Pressure, Diabetes, and BMI and the role they play in your overall health.   Sleep Hygiene: -Provides group verbal and written instruction about how sleep can affect your health.  Define sleep hygiene, discuss sleep cycles and impact of sleep habits. Review good sleep hygiene tips.    Other: -Provides group and verbal instruction on various topics (see comments)    Knowledge Questionnaire Score: Knowledge Questionnaire Score - 01/20/18 1440      Knowledge Questionnaire Score   Pre Score  13/18 reviewed with patient        Core Components/Risk Factors/Patient Goals at Admission: Personal Goals and Risk Factors at Admission - 01/20/18 1443      Core Components/Risk Factors/Patient Goals on Admission    Weight Management  Yes;Obesity;Weight Loss    Intervention  Weight Management: Develop a combined nutrition and exercise program designed to reach desired caloric intake, while maintaining appropriate intake of nutrient and fiber, sodium and fats, and appropriate energy expenditure required for the weight goal.;Weight Management: Provide education and appropriate resources to help participant work on and attain dietary goals.;Weight Management/Obesity: Establish reasonable short term and long term weight goals.    Admit Weight  318 lb 14.4 oz (144.7 kg)    Goal Weight: Short Term  313 lb (142 kg)    Goal Weight: Long Term  200 lb (90.7 kg)    Expected Outcomes  Short Term: Continue to assess and modify interventions until short term weight is achieved;Long Term: Adherence to nutrition and physical activity/exercise program aimed toward attainment of established weight goal;Weight Maintenance: Understanding of the daily nutrition guidelines, which includes 25-35% calories from fat, 7% or less cal from saturated fats, less than 279m  cholesterol, less than 1.5gm of sodium, & 5 or more servings of fruits and vegetables daily;Weight Loss: Understanding of general recommendations for a balanced deficit meal plan, which promotes 1-2 lb weight loss per week and includes a negative energy balance of [phone] kcal/d;Understanding recommendations for meals to include 15-35% energy as protein, 25-35% energy from fat, 35-60% energy from carbohydrates, less than 2017mof dietary cholesterol, 20-35 gm of total fiber daily;Understanding of distribution of calorie intake throughout the day with the consumption of 4-5 meals/snacks    Improve shortness of breath with ADL's  Yes    Intervention  Provide education, individualized exercise plan and daily activity instruction to help decrease symptoms of SOB with activities of daily living.    Expected Outcomes  Short Term: Improve cardiorespiratory fitness to achieve a reduction of symptoms when performing ADLs;Long Term: Be able to perform more ADLs without symptoms or delay the onset of symptoms    Diabetes  Yes    Intervention  Provide education about signs/symptoms and action to take for hypo/hyperglycemia.;Provide education about proper nutrition, including hydration, and aerobic/resistive exercise prescription along with prescribed medications to achieve blood glucose in normal ranges: Fasting glucose 65-99 mg/dL    Expected Outcomes  Short Term: Participant verbalizes understanding of the signs/symptoms and immediate care of hyper/hypoglycemia, proper foot care and importance of medication, aerobic/resistive exercise and nutrition plan for blood glucose control.;Long Term: Attainment of HbA1C < 7%.    Heart Failure  Yes  Intervention  Provide a combined exercise and nutrition program that is supplemented with education, support and counseling about heart failure. Directed toward relieving symptoms such as shortness of breath, decreased exercise tolerance, and extremity edema.    Expected Outcomes   Improve functional capacity of life;Short term: Attendance in program 2-3 days a week with increased exercise capacity. Reported lower sodium intake. Reported increased fruit and vegetable intake. Reports medication compliance.;Long term: Adoption of self-care skills and reduction of barriers for early signs and symptoms recognition and intervention leading to self-care maintenance.;Short term: Daily weights obtained and reported for increase. Utilizing diuretic protocols set by physician.    Hypertension  Yes    Intervention  Provide education on lifestyle modifcations including regular physical activity/exercise, weight management, moderate sodium restriction and increased consumption of fresh fruit, vegetables, and low fat dairy, alcohol moderation, and smoking cessation.;Monitor prescription use compliance.    Expected Outcomes  Short Term: Continued assessment and intervention until BP is < 140/46m HG in hypertensive participants. < 130/865mHG in hypertensive participants with diabetes, heart failure or chronic kidney disease.;Long Term: Maintenance of blood pressure at goal levels.    Lipids  Yes    Intervention  Provide education and support for participant on nutrition & aerobic/resistive exercise along with prescribed medications to achieve LDL <7044mHDL >35m80m  Expected Outcomes  Long Term: Cholesterol controlled with medications as prescribed, with individualized exercise RX and with personalized nutrition plan. Value goals: LDL < 70mg59mL > 40 mg.;Short Term: Participant states understanding of desired cholesterol values and is compliant with medications prescribed. Participant is following exercise prescription and nutrition guidelines.       Core Components/Risk Factors/Patient Goals Review:    Core Components/Risk Factors/Patient Goals at Discharge (Final Review):    ITP Comments: ITP Comments    Row Name 01/20/18 1414 02/16/18 0840         ITP Comments  Medical Evaluation  completed. Chart sent for review and changes to Dr. Mark Emily Filbertctor of [facility]osis can be found in [facility] encounter 01/05/18   30 day review completed. ITP sent to Dr. Mark Emily Filbertctor of [facility] with ITP unless changes are made by physician         Comments: 30 day review

## 2018-02-16 NOTE — Progress Notes (Signed)
Daily Session Note  Patient Details  Name: Alfred Carney MRN: [id number] Date of Birth: [birth year] Referring Provider:     Pulmonary Rehab from 01/20/2018 in [facility] Cardiac and Pulmonary Rehab  Referring Provider  Christinia Gully MD      Encounter Date: 02/16/2018  Check In: Session Check In - 02/16/18 1125      Check-In   Location  [facility]-Cardiac & Pulmonary Rehab    Staff Present  Justin Mend RCP,RRT,BSRT;Laureen Owens Shark, BS, RRT, Respiratory Bertis Ruddy, BS, ACSM CEP, Exercise Physiologist    Supervising physician immediately available to respond to emergencies  [facility] immediately available ER MD    Physician(s)  Drs. Malinda and Kinner    Medication changes reported      No    Fall or balance concerns reported     No    Tobacco Cessation  No Change    Warm-up and Cool-down  Performed as group-led Higher education careers adviser Performed  Yes    VAD Patient?  No      Pain Assessment   Currently in Pain?  No/denies    Multiple Pain Sites  No          Social History   Tobacco Use  Smoking Status Former Smoker  . Packs/day: 1.00  . Years: 35.00  . Pack years: 35.00  . Types: Cigarettes  . Last attempt to quit: 09/16/2006  . Years since quitting: 11.4  Smokeless Tobacco Never Used    Goals Met:  Proper associated with RPD/PD & O2 Sat Independence with exercise equipment Exercise tolerated well No report of cardiac concerns or symptoms Strength training completed today  Goals Unmet:  Not Applicable  Comments: Pt able to follow exercise prescription today without complaint.  Will continue to monitor for progression.    Dr. Emily Filbert is Medical Director for [place] and [facility].

## 2018-02-18 ENCOUNTER — Encounter: Payer: Medicare HMO | Admitting: *Deleted

## 2018-02-18 DIAGNOSIS — Z7984 Long term (current) use of oral hypoglycemic drugs: Secondary | ICD-10-CM | POA: Diagnosis not present

## 2018-02-18 DIAGNOSIS — I10 Essential (primary) hypertension: Secondary | ICD-10-CM | POA: Diagnosis not present

## 2018-02-18 DIAGNOSIS — E785 Hyperlipidemia, unspecified: Secondary | ICD-10-CM | POA: Diagnosis not present

## 2018-02-18 DIAGNOSIS — Z9981 Dependence on supplemental oxygen: Secondary | ICD-10-CM | POA: Diagnosis not present

## 2018-02-18 DIAGNOSIS — J449 Chronic obstructive pulmonary disease, unspecified: Secondary | ICD-10-CM | POA: Diagnosis not present

## 2018-02-18 DIAGNOSIS — I2510 Atherosclerotic heart disease of native coronary artery without angina pectoris: Secondary | ICD-10-CM | POA: Diagnosis not present

## 2018-02-18 DIAGNOSIS — Z79899 Other long term (current) drug therapy: Secondary | ICD-10-CM | POA: Diagnosis not present

## 2018-02-18 DIAGNOSIS — M1990 Unspecified osteoarthritis, unspecified site: Secondary | ICD-10-CM | POA: Diagnosis not present

## 2018-02-18 DIAGNOSIS — E119 Type 2 diabetes mellitus without complications: Secondary | ICD-10-CM | POA: Diagnosis not present

## 2018-02-18 DIAGNOSIS — Z7982 Long term (current) use of aspirin: Secondary | ICD-10-CM | POA: Diagnosis not present

## 2018-02-18 NOTE — Progress Notes (Signed)
Daily Session Note  Patient Details  Name: Alfred Carney MRN: [id number] Date of Birth: 16-Feb-1951 Referring Provider:     Pulmonary Rehab from 01/20/2018 in [facility] Cardiac and Pulmonary Rehab  Referring Provider  Christinia Gully MD      Encounter Date: 02/18/2018  Check In: Session Check In - 02/18/18 1120      Check-In   Location  [facility]-Cardiac & Pulmonary Rehab    Staff Present  Darel Hong, RN BSN;Joseph Flavia Shipper    Supervising physician immediately available to respond to emergencies  [facility] immediately available ER MD    Physician(s)  Drs. Quentin Cornwall and Nixa    Medication changes reported      No    Fall or balance concerns reported     No    Tobacco Cessation  No Change    Warm-up and Cool-down  Performed as group-led Higher education careers adviser Performed  Yes    VAD Patient?  No      Pain Assessment   Currently in Pain?  No/denies    Multiple Pain Sites  No          Social History   Tobacco Use  Smoking Status Former Smoker  . Packs/day: 1.00  . Years: 35.00  . Pack years: 35.00  . Types: Cigarettes  . Last attempt to quit: 09/16/2006  . Years since quitting: 11.4  Smokeless Tobacco Never Used    Goals Met:  Proper associated with RPD/PD & O2 Sat Independence with exercise equipment Using PLB without cueing & demonstrates good technique Exercise tolerated well Strength training completed today  Goals Unmet:  Not Applicable  Comments: Pt able to follow exercise prescription today without complaint.  Will continue to monitor for progression.    Dr. Emily Filbert is Medical Director for [place] and [facility].

## 2018-02-20 DIAGNOSIS — R0602 Shortness of breath: Secondary | ICD-10-CM | POA: Diagnosis not present

## 2018-02-20 DIAGNOSIS — J449 Chronic obstructive pulmonary disease, unspecified: Secondary | ICD-10-CM | POA: Diagnosis not present

## 2018-02-23 DIAGNOSIS — Z79899 Other long term (current) drug therapy: Secondary | ICD-10-CM | POA: Diagnosis not present

## 2018-02-23 DIAGNOSIS — I2510 Atherosclerotic heart disease of native coronary artery without angina pectoris: Secondary | ICD-10-CM | POA: Diagnosis not present

## 2018-02-23 DIAGNOSIS — J449 Chronic obstructive pulmonary disease, unspecified: Secondary | ICD-10-CM | POA: Diagnosis not present

## 2018-02-23 DIAGNOSIS — I10 Essential (primary) hypertension: Secondary | ICD-10-CM | POA: Diagnosis not present

## 2018-02-23 DIAGNOSIS — M1990 Unspecified osteoarthritis, unspecified site: Secondary | ICD-10-CM | POA: Diagnosis not present

## 2018-02-23 DIAGNOSIS — E785 Hyperlipidemia, unspecified: Secondary | ICD-10-CM | POA: Diagnosis not present

## 2018-02-23 DIAGNOSIS — Z9981 Dependence on supplemental oxygen: Secondary | ICD-10-CM | POA: Diagnosis not present

## 2018-02-23 DIAGNOSIS — E119 Type 2 diabetes mellitus without complications: Secondary | ICD-10-CM | POA: Diagnosis not present

## 2018-02-23 DIAGNOSIS — Z7984 Long term (current) use of oral hypoglycemic drugs: Secondary | ICD-10-CM | POA: Diagnosis not present

## 2018-02-23 DIAGNOSIS — Z7982 Long term (current) use of aspirin: Secondary | ICD-10-CM | POA: Diagnosis not present

## 2018-02-23 NOTE — Progress Notes (Signed)
Daily Session Note  Patient Details  Name: Alfred Carney MRN: [id number] Date of Birth: 1950-12-28 Referring Provider:     Pulmonary Rehab from 01/20/2018 in [facility] Cardiac and Pulmonary Rehab  Referring Provider  Christinia Gully MD      Encounter Date: 02/23/2018  Check In: Session Check In - 02/23/18 1115      Check-In   Location  [facility]-Cardiac & Pulmonary Rehab    Staff Present  Justin Mend Jaci Carrel, BS, ACSM CEP, Exercise Physiologist;Mandi Zachery Conch, BS, [facility]    Supervising physician immediately available to respond to emergencies  [facility] immediately available ER MD    Physician(s)  Dr. Alfred Levins and Burlene Arnt    Medication changes reported      No    Fall or balance concerns reported     No    Tobacco Cessation  No Change    Warm-up and Cool-down  Performed as group-led instruction    Resistance Training Performed  Yes    VAD Patient?  No      Pain Assessment   Currently in Pain?  No/denies          Social History   Tobacco Use  Smoking Status Former Smoker  . Packs/day: 1.00  . Years: 35.00  . Pack years: 35.00  . Types: Cigarettes  . Last attempt to quit: 09/16/2006  . Years since quitting: 11.4  Smokeless Tobacco Never Used    Goals Met:  Independence with exercise equipment Exercise tolerated well No report of cardiac concerns or symptoms Strength training completed today  Goals Unmet:  Not Applicable  Comments: Pt able to follow exercise prescription today without complaint.  Will continue to monitor for progression.   Dr. Emily Filbert is Medical Director for [place] and [facility].

## 2018-02-24 DIAGNOSIS — H25093 Other age-related incipient cataract, bilateral: Secondary | ICD-10-CM | POA: Diagnosis not present

## 2018-02-24 DIAGNOSIS — E119 Type 2 diabetes mellitus without complications: Secondary | ICD-10-CM | POA: Diagnosis not present

## 2018-02-24 DIAGNOSIS — Z7984 Long term (current) use of oral hypoglycemic drugs: Secondary | ICD-10-CM | POA: Diagnosis not present

## 2018-02-24 DIAGNOSIS — H524 Presbyopia: Secondary | ICD-10-CM | POA: Diagnosis not present

## 2018-02-24 DIAGNOSIS — I10 Essential (primary) hypertension: Secondary | ICD-10-CM | POA: Diagnosis not present

## 2018-02-24 DIAGNOSIS — H5213 Myopia, bilateral: Secondary | ICD-10-CM | POA: Diagnosis not present

## 2018-02-24 DIAGNOSIS — H52222 Regular astigmatism, left eye: Secondary | ICD-10-CM | POA: Diagnosis not present

## 2018-02-24 DIAGNOSIS — H5202 Hypermetropia, left eye: Secondary | ICD-10-CM | POA: Diagnosis not present

## 2018-02-24 DIAGNOSIS — H11153 Pinguecula, bilateral: Secondary | ICD-10-CM | POA: Diagnosis not present

## 2018-02-25 ENCOUNTER — Encounter: Payer: Medicare HMO | Admitting: *Deleted

## 2018-02-25 DIAGNOSIS — Z79899 Other long term (current) drug therapy: Secondary | ICD-10-CM | POA: Diagnosis not present

## 2018-02-25 DIAGNOSIS — E785 Hyperlipidemia, unspecified: Secondary | ICD-10-CM | POA: Diagnosis not present

## 2018-02-25 DIAGNOSIS — M1990 Unspecified osteoarthritis, unspecified site: Secondary | ICD-10-CM | POA: Diagnosis not present

## 2018-02-25 DIAGNOSIS — J449 Chronic obstructive pulmonary disease, unspecified: Secondary | ICD-10-CM | POA: Diagnosis not present

## 2018-02-25 DIAGNOSIS — I10 Essential (primary) hypertension: Secondary | ICD-10-CM | POA: Diagnosis not present

## 2018-02-25 DIAGNOSIS — E119 Type 2 diabetes mellitus without complications: Secondary | ICD-10-CM | POA: Diagnosis not present

## 2018-02-25 DIAGNOSIS — Z7984 Long term (current) use of oral hypoglycemic drugs: Secondary | ICD-10-CM | POA: Diagnosis not present

## 2018-02-25 DIAGNOSIS — Z7982 Long term (current) use of aspirin: Secondary | ICD-10-CM | POA: Diagnosis not present

## 2018-02-25 DIAGNOSIS — Z9981 Dependence on supplemental oxygen: Secondary | ICD-10-CM | POA: Diagnosis not present

## 2018-02-25 DIAGNOSIS — I2510 Atherosclerotic heart disease of native coronary artery without angina pectoris: Secondary | ICD-10-CM | POA: Diagnosis not present

## 2018-02-25 NOTE — Progress Notes (Signed)
Daily Session Note  Patient Details  Name: Alfred Carney MRN: [id number] Date of Birth: 11/21/1950 Referring Provider:     Pulmonary Rehab from 01/20/2018 in [facility] Cardiac and Pulmonary Rehab  Referring Provider  Wert, Michael MD      Encounter Date: 02/25/2018  Check In: Session Check In - 02/25/18 1135      Check-In   Location  [facility]-Cardiac & Pulmonary Rehab    Staff Present  Joseph Hood RCP,RRT,BSRT;Jessica Hawkins, MA, RCEP, CCRP, Exercise Physiologist;Meredith Craven, RN BSN    Supervising physician immediately available to respond to emergencies  [facility] immediately available ER MD    Physician(s)  Dr. Paduchowski and Stafford    Medication changes reported      No    Fall or balance concerns reported     No    Tobacco Cessation  No Change    Warm-up and Cool-down  Performed as group-led instruction    Resistance Training Performed  Yes    VAD Patient?  No      Pain Assessment   Currently in Pain?  No/denies          Social History   Tobacco Use  Smoking Status Former Smoker  . Packs/day: 1.00  . Years: 35.00  . Pack years: 35.00  . Types: Cigarettes  . Last attempt to quit: 09/16/2006  . Years since quitting: 11.4  Smokeless Tobacco Never Used    Goals Met:  Proper associated with RPD/PD & O2 Sat Independence with exercise equipment Using PLB without cueing & demonstrates good technique Exercise tolerated well No report of cardiac concerns or symptoms Strength training completed today  Goals Unmet:  Not Applicable  Comments: Pt able to follow exercise prescription today without complaint.  Will continue to monitor for progression. Reviewed home exercise with pt today.  Pt plans to walk and do chair exercises at home for exercise.  He will start with one extra day a week.  Reviewed THR, pulse, RPE, sign and symptoms, NTG use, and when to call 911 or MD.  Also discussed weather considerations and indoor options.  Pt voiced understanding.   Dr. Mark Miller  is Medical Director for [facility] and [facility]. 

## 2018-02-27 DIAGNOSIS — E785 Hyperlipidemia, unspecified: Secondary | ICD-10-CM | POA: Diagnosis not present

## 2018-02-27 DIAGNOSIS — I10 Essential (primary) hypertension: Secondary | ICD-10-CM | POA: Diagnosis not present

## 2018-02-27 DIAGNOSIS — J449 Chronic obstructive pulmonary disease, unspecified: Secondary | ICD-10-CM

## 2018-02-27 DIAGNOSIS — Z9981 Dependence on supplemental oxygen: Secondary | ICD-10-CM | POA: Diagnosis not present

## 2018-02-27 DIAGNOSIS — Z79899 Other long term (current) drug therapy: Secondary | ICD-10-CM | POA: Diagnosis not present

## 2018-02-27 DIAGNOSIS — E119 Type 2 diabetes mellitus without complications: Secondary | ICD-10-CM | POA: Diagnosis not present

## 2018-02-27 DIAGNOSIS — I2510 Atherosclerotic heart disease of native coronary artery without angina pectoris: Secondary | ICD-10-CM | POA: Diagnosis not present

## 2018-02-27 DIAGNOSIS — M1990 Unspecified osteoarthritis, unspecified site: Secondary | ICD-10-CM | POA: Diagnosis not present

## 2018-02-27 DIAGNOSIS — Z7982 Long term (current) use of aspirin: Secondary | ICD-10-CM | POA: Diagnosis not present

## 2018-02-27 DIAGNOSIS — Z7984 Long term (current) use of oral hypoglycemic drugs: Secondary | ICD-10-CM | POA: Diagnosis not present

## 2018-02-27 NOTE — Progress Notes (Signed)
Daily Session Note  Patient Details  Name: Alfred Carney MRN: [id number] Date of Birth: Apr 02, 1951 Referring Provider:     Pulmonary Rehab from 01/20/2018 in [facility] Cardiac and Pulmonary Rehab  Referring Provider  Christinia Gully MD      Encounter Date: 02/27/2018  Check In: Session Check In - 02/27/18 1135      Check-In   Location  [facility]-Cardiac & Pulmonary Rehab    Staff Present  Justin Mend RCP,RRT,BSRT;Meredith Sherryll Burger, RN BSN;Jessica Luan Pulling, MA, RCEP, CCRP, Exercise Physiologist    Supervising physician immediately available to respond to emergencies  [facility] immediately available ER MD    Physician(s)  Dr. Corky Downs and Joni Fears    Medication changes reported      No    Fall or balance concerns reported     No    Tobacco Cessation  No Change    Warm-up and Cool-down  Performed as group-led instruction    Resistance Training Performed  Yes    VAD Patient?  No      Pain Assessment   Currently in Pain?  No/denies          Social History   Tobacco Use  Smoking Status Former Smoker  . Packs/day: 1.00  . Years: 35.00  . Pack years: 35.00  . Types: Cigarettes  . Last attempt to quit: 09/16/2006  . Years since quitting: 11.4  Smokeless Tobacco Never Used    Goals Met:  Independence with exercise equipment Exercise tolerated well Personal goals reviewed No report of cardiac concerns or symptoms Strength training completed today  Goals Unmet:  Not Applicable  Comments: Pt able to follow exercise prescription today without complaint.  Will continue to monitor for progression.   Dr. Emily Filbert is Medical Director for [place] and [facility].

## 2018-03-02 DIAGNOSIS — J449 Chronic obstructive pulmonary disease, unspecified: Secondary | ICD-10-CM | POA: Diagnosis not present

## 2018-03-02 DIAGNOSIS — I2510 Atherosclerotic heart disease of native coronary artery without angina pectoris: Secondary | ICD-10-CM | POA: Diagnosis not present

## 2018-03-02 DIAGNOSIS — I10 Essential (primary) hypertension: Secondary | ICD-10-CM | POA: Diagnosis not present

## 2018-03-02 DIAGNOSIS — E119 Type 2 diabetes mellitus without complications: Secondary | ICD-10-CM | POA: Diagnosis not present

## 2018-03-02 DIAGNOSIS — Z7982 Long term (current) use of aspirin: Secondary | ICD-10-CM | POA: Diagnosis not present

## 2018-03-02 DIAGNOSIS — Z9981 Dependence on supplemental oxygen: Secondary | ICD-10-CM | POA: Diagnosis not present

## 2018-03-02 DIAGNOSIS — E785 Hyperlipidemia, unspecified: Secondary | ICD-10-CM | POA: Diagnosis not present

## 2018-03-02 DIAGNOSIS — M1990 Unspecified osteoarthritis, unspecified site: Secondary | ICD-10-CM | POA: Diagnosis not present

## 2018-03-02 DIAGNOSIS — Z7984 Long term (current) use of oral hypoglycemic drugs: Secondary | ICD-10-CM | POA: Diagnosis not present

## 2018-03-02 DIAGNOSIS — Z79899 Other long term (current) drug therapy: Secondary | ICD-10-CM | POA: Diagnosis not present

## 2018-03-02 NOTE — Progress Notes (Signed)
Daily Session Note  Patient Details  Name: Alfred Carney MRN: [id number] Date of Birth: November 20, 1950 Referring Provider:     Pulmonary Rehab from 01/20/2018 in [facility] Cardiac and Pulmonary Rehab  Referring Provider  Christinia Gully MD      Encounter Date: 03/02/2018  Check In: Session Check In - 03/02/18 1154      Check-In   Location  [facility]-Cardiac & Pulmonary Rehab    Staff Present  Justin Mend RCP,RRT,BSRT;Amanda Oletta Darter, BA, ACSM CEP, Exercise Physiologist;Kelly Amedeo Plenty, BS, ACSM CEP, Exercise Physiologist    Supervising physician immediately available to respond to emergencies  [facility] immediately available ER MD    Physician(s)  Dr. Corky Downs and Clearnce Hasten    Medication changes reported      No    Fall or balance concerns reported     No    Tobacco Cessation  No Change    Warm-up and Cool-down  Performed as group-led instruction    Resistance Training Performed  Yes    VAD Patient?  No      Pain Assessment   Currently in Pain?  No/denies          Social History   Tobacco Use  Smoking Status Former Smoker  . Packs/day: 1.00  . Years: 35.00  . Pack years: 35.00  . Types: Cigarettes  . Last attempt to quit: 09/16/2006  . Years since quitting: 11.4  Smokeless Tobacco Never Used    Goals Met:  Independence with exercise equipment Exercise tolerated well No report of cardiac concerns or symptoms Strength training completed today  Goals Unmet:  Not Applicable  Comments: Pt able to follow exercise prescription today without complaint.  Will continue to monitor for progression.   Dr. Emily Filbert is Medical Director for [place] and [facility].

## 2018-03-04 DIAGNOSIS — I2510 Atherosclerotic heart disease of native coronary artery without angina pectoris: Secondary | ICD-10-CM | POA: Diagnosis not present

## 2018-03-04 DIAGNOSIS — J449 Chronic obstructive pulmonary disease, unspecified: Secondary | ICD-10-CM | POA: Diagnosis not present

## 2018-03-04 DIAGNOSIS — E785 Hyperlipidemia, unspecified: Secondary | ICD-10-CM | POA: Diagnosis not present

## 2018-03-04 DIAGNOSIS — Z9981 Dependence on supplemental oxygen: Secondary | ICD-10-CM | POA: Diagnosis not present

## 2018-03-04 DIAGNOSIS — M1990 Unspecified osteoarthritis, unspecified site: Secondary | ICD-10-CM | POA: Diagnosis not present

## 2018-03-04 DIAGNOSIS — I10 Essential (primary) hypertension: Secondary | ICD-10-CM | POA: Diagnosis not present

## 2018-03-04 DIAGNOSIS — Z79899 Other long term (current) drug therapy: Secondary | ICD-10-CM | POA: Diagnosis not present

## 2018-03-04 DIAGNOSIS — Z7982 Long term (current) use of aspirin: Secondary | ICD-10-CM | POA: Diagnosis not present

## 2018-03-04 DIAGNOSIS — E119 Type 2 diabetes mellitus without complications: Secondary | ICD-10-CM | POA: Diagnosis not present

## 2018-03-04 DIAGNOSIS — Z7984 Long term (current) use of oral hypoglycemic drugs: Secondary | ICD-10-CM | POA: Diagnosis not present

## 2018-03-04 NOTE — Progress Notes (Signed)
Daily Session Note  Patient Details  Name: Alfred Carney MRN: [id number] Date of Birth: 1951-06-16 Referring Provider:     Pulmonary Rehab from 01/20/2018 in [facility] Cardiac and Pulmonary Rehab  Referring Provider  Christinia Gully MD      Encounter Date: 03/04/2018  Check In: Session Check In - 03/04/18 0952      Check-In   Location  [facility]-Cardiac & Pulmonary Rehab    Staff Present  Justin Mend Lorre Nick, MA, RCEP, CCRP, Exercise Physiologist;Amanda Oletta Darter, [place], ACSM CEP, Exercise Physiologist    Supervising physician immediately available to respond to emergencies  [facility] immediately available ER MD    Physician(s)  Dr. Jimmye Norman and Corky Downs    Medication changes reported      No    Fall or balance concerns reported     No    Tobacco Cessation  No Change    Warm-up and Cool-down  Performed as group-led instruction    Resistance Training Performed  Yes    VAD Patient?  No      Pain Assessment   Currently in Pain?  No/denies        Exercise Prescription Changes - 03/03/18 1400      Response to Exercise   Blood Pressure (Admit)  140/80    Blood Pressure (Exit)  128/68    Heart Rate (Admit)  62 bpm    Heart Rate (Exercise)  97 bpm    Heart Rate (Exit)  82 bpm    Oxygen Saturation (Admit)  95 %    Oxygen Saturation (Exercise)  89 %    Oxygen Saturation (Exit)  93 %    Rating of Perceived Exertion (Exercise)  15    Perceived Dyspnea (Exercise)  3    Symptoms  SOB on treadmill    Duration  Continue with 45 min of aerobic exercise without signs/symptoms of physical distress.    Intensity  THRR unchanged      Progression   Progression  Continue to progress workloads to maintain intensity without signs/symptoms of physical distress.    Average METs  2.23      Resistance Training   Training Prescription  Yes    Weight  4 lbs    Reps  10-15      Interval Training   Interval Training  No      Oxygen   Oxygen  Continuous    Liters  4      Treadmill   MPH   0.8    Grade  0    Minutes  15    METs  1.6      T5 Nustep   Level  3    SPM  76    Minutes  15    METs  2.1      Biostep-RELP   Level  4    SPM  44    Minutes  15    METs  3      Home Exercise Plan   Plans to continue exercise at  Home (comment) walking, chair exercises    Frequency  Add 1 additional day to program exercise sessions.    Initial Home Exercises Provided  02/25/18       Social History   Tobacco Use  Smoking Status Former Smoker  . Packs/day: 1.00  . Years: 35.00  . Pack years: 35.00  . Types: Cigarettes  . Last attempt to quit: 09/16/2006  . Years since quitting: 11.4  Smokeless Tobacco Never Used  Goals Met:  Independence with exercise equipment Exercise tolerated well No report of cardiac concerns or symptoms Strength training completed today  Goals Unmet:  Not Applicable  Comments: Pt able to follow exercise prescription today without complaint.  Will continue to monitor for progression.   Dr. Emily Filbert is Medical Director for [place] and [facility].

## 2018-03-06 DIAGNOSIS — Z7982 Long term (current) use of aspirin: Secondary | ICD-10-CM | POA: Diagnosis not present

## 2018-03-06 DIAGNOSIS — Z79899 Other long term (current) drug therapy: Secondary | ICD-10-CM | POA: Diagnosis not present

## 2018-03-06 DIAGNOSIS — J449 Chronic obstructive pulmonary disease, unspecified: Secondary | ICD-10-CM

## 2018-03-06 DIAGNOSIS — E785 Hyperlipidemia, unspecified: Secondary | ICD-10-CM | POA: Diagnosis not present

## 2018-03-06 DIAGNOSIS — I2510 Atherosclerotic heart disease of native coronary artery without angina pectoris: Secondary | ICD-10-CM | POA: Diagnosis not present

## 2018-03-06 DIAGNOSIS — E119 Type 2 diabetes mellitus without complications: Secondary | ICD-10-CM | POA: Diagnosis not present

## 2018-03-06 DIAGNOSIS — Z7984 Long term (current) use of oral hypoglycemic drugs: Secondary | ICD-10-CM | POA: Diagnosis not present

## 2018-03-06 DIAGNOSIS — Z9981 Dependence on supplemental oxygen: Secondary | ICD-10-CM | POA: Diagnosis not present

## 2018-03-06 DIAGNOSIS — M1990 Unspecified osteoarthritis, unspecified site: Secondary | ICD-10-CM | POA: Diagnosis not present

## 2018-03-06 DIAGNOSIS — I10 Essential (primary) hypertension: Secondary | ICD-10-CM | POA: Diagnosis not present

## 2018-03-06 NOTE — Progress Notes (Signed)
Daily Session Note  Patient Details  Name: Alfred Carney MRN: [id number] Date of Birth: 01-18-1951 Referring Provider:     Pulmonary Rehab from 01/20/2018 in [facility] Cardiac and Pulmonary Rehab  Referring Provider  Christinia Gully MD      Encounter Date: 03/06/2018  Check In: Session Check In - 03/06/18 1128      Check-In   Location  [facility]-Cardiac & Pulmonary Rehab    Staff Present  Justin Mend Lorre Nick, [place], RCEP, CCRP, Exercise Physiologist;Meredith Sherryll Burger, RN BSN    Supervising physician immediately available to respond to emergencies  [facility] immediately available ER MD    Physician(s)  Dr. Corky Downs and Alfred Levins    Medication changes reported      No    Fall or balance concerns reported     No    Tobacco Cessation  No Change    Warm-up and Cool-down  Performed as group-led instruction    Resistance Training Performed  Yes    VAD Patient?  No      Pain Assessment   Currently in Pain?  No/denies          Social History   Tobacco Use  Smoking Status Former Smoker  . Packs/day: 1.00  . Years: 35.00  . Pack years: 35.00  . Types: Cigarettes  . Last attempt to quit: 09/16/2006  . Years since quitting: 11.4  Smokeless Tobacco Never Used    Goals Met:  Independence with exercise equipment Exercise tolerated well No report of cardiac concerns or symptoms Strength training completed today  Goals Unmet:  Not Applicable  Comments: Pt able to follow exercise prescription today without complaint.  Will continue to monitor for progression.   Dr. Emily Filbert is Medical Director for [place] and [facility].

## 2018-03-09 DIAGNOSIS — J449 Chronic obstructive pulmonary disease, unspecified: Secondary | ICD-10-CM | POA: Diagnosis not present

## 2018-03-09 DIAGNOSIS — Z9981 Dependence on supplemental oxygen: Secondary | ICD-10-CM | POA: Diagnosis not present

## 2018-03-09 DIAGNOSIS — M1990 Unspecified osteoarthritis, unspecified site: Secondary | ICD-10-CM | POA: Diagnosis not present

## 2018-03-09 DIAGNOSIS — I2510 Atherosclerotic heart disease of native coronary artery without angina pectoris: Secondary | ICD-10-CM | POA: Diagnosis not present

## 2018-03-09 DIAGNOSIS — Z7982 Long term (current) use of aspirin: Secondary | ICD-10-CM | POA: Diagnosis not present

## 2018-03-09 DIAGNOSIS — I10 Essential (primary) hypertension: Secondary | ICD-10-CM | POA: Diagnosis not present

## 2018-03-09 DIAGNOSIS — E119 Type 2 diabetes mellitus without complications: Secondary | ICD-10-CM | POA: Diagnosis not present

## 2018-03-09 DIAGNOSIS — Z79899 Other long term (current) drug therapy: Secondary | ICD-10-CM | POA: Diagnosis not present

## 2018-03-09 DIAGNOSIS — Z7984 Long term (current) use of oral hypoglycemic drugs: Secondary | ICD-10-CM | POA: Diagnosis not present

## 2018-03-09 DIAGNOSIS — E785 Hyperlipidemia, unspecified: Secondary | ICD-10-CM | POA: Diagnosis not present

## 2018-03-09 NOTE — Progress Notes (Signed)
Daily Session Note  Patient Details  Name: Dezi Schaner MRN: [id number] Date of Birth: May 10, 1951 Referring Provider:     Pulmonary Rehab from 01/20/2018 in [facility] Cardiac and Pulmonary Rehab  Referring Provider  Christinia Gully MD      Encounter Date: 03/09/2018  Check In: Session Check In - 03/09/18 1123      Check-In   Location  [facility]-Cardiac & Pulmonary Rehab    Staff Present  Justin Mend Jaci Carrel, BS, ACSM CEP, Exercise Physiologist;Amanda Oletta Darter, [place], ACSM CEP, Exercise Physiologist    Supervising physician immediately available to respond to emergencies  [facility] immediately available ER MD    Physician(s)  Dr. Jimmye Norman and Cinda Quest    Medication changes reported      No    Fall or balance concerns reported     No    Tobacco Cessation  No Change    Warm-up and Cool-down  Performed as group-led instruction    Resistance Training Performed  Yes    VAD Patient?  No      Pain Assessment   Currently in Pain?  No/denies          Social History   Tobacco Use  Smoking Status Former Smoker  . Packs/day: 1.00  . Years: 35.00  . Pack years: 35.00  . Types: Cigarettes  . Last attempt to quit: 09/16/2006  . Years since quitting: 11.4  Smokeless Tobacco Never Used    Goals Met:  Independence with exercise equipment Exercise tolerated well No report of cardiac concerns or symptoms Strength training completed today  Goals Unmet:  Not Applicable  Comments: Pt able to follow exercise prescription today without complaint.  Will continue to monitor for progression.   Dr. Emily Filbert is Medical Director for [place] and [facility].

## 2018-03-11 ENCOUNTER — Encounter: Payer: Medicare HMO | Admitting: *Deleted

## 2018-03-11 DIAGNOSIS — J449 Chronic obstructive pulmonary disease, unspecified: Secondary | ICD-10-CM | POA: Diagnosis not present

## 2018-03-11 DIAGNOSIS — E119 Type 2 diabetes mellitus without complications: Secondary | ICD-10-CM | POA: Diagnosis not present

## 2018-03-11 DIAGNOSIS — Z79899 Other long term (current) drug therapy: Secondary | ICD-10-CM | POA: Diagnosis not present

## 2018-03-11 DIAGNOSIS — Z7982 Long term (current) use of aspirin: Secondary | ICD-10-CM | POA: Diagnosis not present

## 2018-03-11 DIAGNOSIS — I10 Essential (primary) hypertension: Secondary | ICD-10-CM | POA: Diagnosis not present

## 2018-03-11 DIAGNOSIS — I2510 Atherosclerotic heart disease of native coronary artery without angina pectoris: Secondary | ICD-10-CM | POA: Diagnosis not present

## 2018-03-11 DIAGNOSIS — M1990 Unspecified osteoarthritis, unspecified site: Secondary | ICD-10-CM | POA: Diagnosis not present

## 2018-03-11 DIAGNOSIS — Z7984 Long term (current) use of oral hypoglycemic drugs: Secondary | ICD-10-CM | POA: Diagnosis not present

## 2018-03-11 DIAGNOSIS — Z9981 Dependence on supplemental oxygen: Secondary | ICD-10-CM | POA: Diagnosis not present

## 2018-03-11 DIAGNOSIS — E785 Hyperlipidemia, unspecified: Secondary | ICD-10-CM | POA: Diagnosis not present

## 2018-03-11 NOTE — Progress Notes (Signed)
Daily Session Note  Patient Details  Name: Alfred Carney MRN: [id number] Date of Birth: 03-18-1951 Referring Provider:     Pulmonary Rehab from 01/20/2018 in [facility] Cardiac and Pulmonary Rehab  Referring Provider  Christinia Gully MD      Encounter Date: 03/11/2018  Check In: Session Check In - 03/11/18 1141      Check-In   Location  [facility]Cardiac & Pulmonary Rehab    Staff Present  Renita Papa, RN BSN;Jessica Luan Pulling, MA, RCEP, CCRP, Exercise Physiologist;Joseph Flavia Shipper    Supervising physician immediately available to respond to emergencies  [facility] immediately available ER MD    Physician(s)  Dr. Jimmye Norman and Quentin Cornwall    Medication changes reported      No    Fall or balance concerns reported     No    Tobacco Cessation  No Change    Warm-up and Cool-down  Performed as group-led instruction    Resistance Training Performed  Yes    VAD Patient?  No      Pain Assessment   Currently in Pain?  No/denies          Social History   Tobacco Use  Smoking Status Former Smoker  . Packs/day: 1.00  . Years: 35.00  . Pack years: 35.00  . Types: Cigarettes  . Last attempt to quit: 09/16/2006  . Years since quitting: 11.4  Smokeless Tobacco Never Used    Goals Met:  Proper associated with RPD/PD & O2 Sat Independence with exercise equipment Using PLB without cueing & demonstrates good technique Exercise tolerated well No report of cardiac concerns or symptoms Strength training completed today  Goals Unmet:  Not Applicable  Comments: Pt able to follow exercise prescription today without complaint.  Will continue to monitor for progression.    Dr. Emily Filbert is Medical Director for [place] and [facility].

## 2018-03-13 ENCOUNTER — Encounter: Payer: Medicare HMO | Admitting: *Deleted

## 2018-03-13 DIAGNOSIS — I2510 Atherosclerotic heart disease of native coronary artery without angina pectoris: Secondary | ICD-10-CM | POA: Diagnosis not present

## 2018-03-13 DIAGNOSIS — J449 Chronic obstructive pulmonary disease, unspecified: Secondary | ICD-10-CM

## 2018-03-13 DIAGNOSIS — Z7982 Long term (current) use of aspirin: Secondary | ICD-10-CM | POA: Diagnosis not present

## 2018-03-13 DIAGNOSIS — Z9981 Dependence on supplemental oxygen: Secondary | ICD-10-CM | POA: Diagnosis not present

## 2018-03-13 DIAGNOSIS — M1990 Unspecified osteoarthritis, unspecified site: Secondary | ICD-10-CM | POA: Diagnosis not present

## 2018-03-13 DIAGNOSIS — I10 Essential (primary) hypertension: Secondary | ICD-10-CM | POA: Diagnosis not present

## 2018-03-13 DIAGNOSIS — E119 Type 2 diabetes mellitus without complications: Secondary | ICD-10-CM | POA: Diagnosis not present

## 2018-03-13 DIAGNOSIS — E785 Hyperlipidemia, unspecified: Secondary | ICD-10-CM | POA: Diagnosis not present

## 2018-03-13 DIAGNOSIS — Z79899 Other long term (current) drug therapy: Secondary | ICD-10-CM | POA: Diagnosis not present

## 2018-03-13 DIAGNOSIS — Z7984 Long term (current) use of oral hypoglycemic drugs: Secondary | ICD-10-CM | POA: Diagnosis not present

## 2018-03-13 NOTE — Progress Notes (Signed)
Daily Session Note  Patient Details  Name: Alfred Carney MRN: [id number] Date of Birth: Jan 20, 1951 Referring Provider:     Pulmonary Rehab from 01/20/2018 in [facility] Cardiac and Pulmonary Rehab  Referring Provider  Christinia Gully MD      Encounter Date: 03/13/2018  Check In: Session Check In - 03/13/18 1111      Check-In   Location  [facility]-Cardiac & Pulmonary Rehab    Staff Present  Justin Mend Lorre Nick, [place], RCEP, CCRP, Exercise Physiologist;Benedetto Ryder Sherryll Burger, RN BSN    Supervising physician immediately available to respond to emergencies  [facility] immediately available ER MD    Physician(s)  Dr. Cherylann Banas and Jacqualine Code     Medication changes reported      No    Fall or balance concerns reported     No    Tobacco Cessation  No Change    Warm-up and Cool-down  Performed as group-led instruction    Resistance Training Performed  Yes    VAD Patient?  No    PAD/SET Patient?  No      Pain Assessment   Currently in Pain?  No/denies          Social History   Tobacco Use  Smoking Status Former Smoker  . Packs/day: 1.00  . Years: 35.00  . Pack years: 35.00  . Types: Cigarettes  . Last attempt to quit: 09/16/2006  . Years since quitting: 11.4  Smokeless Tobacco Never Used    Goals Met:  Proper associated with RPD/PD & O2 Sat Independence with exercise equipment Using PLB without cueing & demonstrates good technique Exercise tolerated well No report of cardiac concerns or symptoms Strength training completed today  Goals Unmet:  Not Applicable  Comments: Pt able to follow exercise prescription today without complaint.  Will continue to monitor for progression.    Dr. Emily Filbert is Medical Director for [place] and [facility].

## 2018-03-16 ENCOUNTER — Other Ambulatory Visit (INDEPENDENT_AMBULATORY_CARE_PROVIDER_SITE_OTHER): Payer: Medicare HMO

## 2018-03-16 ENCOUNTER — Other Ambulatory Visit: Payer: Self-pay | Admitting: *Deleted

## 2018-03-16 ENCOUNTER — Encounter: Payer: Medicare HMO | Attending: Internal Medicine

## 2018-03-16 DIAGNOSIS — D508 Other iron deficiency anemias: Secondary | ICD-10-CM

## 2018-03-16 DIAGNOSIS — Z85828 Personal history of other malignant neoplasm of skin: Secondary | ICD-10-CM | POA: Diagnosis not present

## 2018-03-16 DIAGNOSIS — Z9981 Dependence on supplemental oxygen: Secondary | ICD-10-CM | POA: Insufficient documentation

## 2018-03-16 DIAGNOSIS — Z7984 Long term (current) use of oral hypoglycemic drugs: Secondary | ICD-10-CM | POA: Insufficient documentation

## 2018-03-16 DIAGNOSIS — I10 Essential (primary) hypertension: Secondary | ICD-10-CM | POA: Diagnosis not present

## 2018-03-16 DIAGNOSIS — D509 Iron deficiency anemia, unspecified: Secondary | ICD-10-CM | POA: Diagnosis not present

## 2018-03-16 DIAGNOSIS — Z8679 Personal history of other diseases of the circulatory system: Secondary | ICD-10-CM | POA: Insufficient documentation

## 2018-03-16 DIAGNOSIS — J449 Chronic obstructive pulmonary disease, unspecified: Secondary | ICD-10-CM | POA: Diagnosis not present

## 2018-03-16 DIAGNOSIS — I2510 Atherosclerotic heart disease of native coronary artery without angina pectoris: Secondary | ICD-10-CM | POA: Diagnosis not present

## 2018-03-16 DIAGNOSIS — Z7982 Long term (current) use of aspirin: Secondary | ICD-10-CM | POA: Diagnosis not present

## 2018-03-16 DIAGNOSIS — E119 Type 2 diabetes mellitus without complications: Secondary | ICD-10-CM | POA: Insufficient documentation

## 2018-03-16 DIAGNOSIS — M1990 Unspecified osteoarthritis, unspecified site: Secondary | ICD-10-CM | POA: Diagnosis not present

## 2018-03-16 DIAGNOSIS — Z79899 Other long term (current) drug therapy: Secondary | ICD-10-CM | POA: Insufficient documentation

## 2018-03-16 DIAGNOSIS — Z6841 Body Mass Index (BMI) 40.0 and over, adult: Secondary | ICD-10-CM | POA: Insufficient documentation

## 2018-03-16 DIAGNOSIS — K219 Gastro-esophageal reflux disease without esophagitis: Secondary | ICD-10-CM | POA: Insufficient documentation

## 2018-03-16 DIAGNOSIS — E785 Hyperlipidemia, unspecified: Secondary | ICD-10-CM | POA: Diagnosis not present

## 2018-03-16 DIAGNOSIS — Z87891 Personal history of nicotine dependence: Secondary | ICD-10-CM | POA: Diagnosis not present

## 2018-03-16 LAB — HEMOGLOBIN: Hemoglobin: 15 g/dL (ref 13.0–17.0)

## 2018-03-16 LAB — FERRITIN: Ferritin: 23.4 ng/mL (ref 22.0–322.0)

## 2018-03-16 NOTE — Progress Notes (Signed)
Pulmonary Individual Treatment Plan  Patient Details  Name: Alfred Carney MRN: [id number] Date of Birth: 07-17-1951 Referring Provider:     Pulmonary Rehab from 01/20/2018 in [facility] Cardiac and Pulmonary Rehab  Referring Provider  Christinia Gully MD      Initial Encounter Date:    Pulmonary Rehab from 01/20/2018 in [facility] Cardiac and Pulmonary Rehab  Date  01/20/18      Visit Diagnosis: Chronic obstructive pulmonary disease, unspecified COPD type ([place])  Patient's Home Medications on Admission:  Current Outpatient Medications:  .  albuterol (PROVENTIL HFA;VENTOLIN HFA) 108 (90 Base) MCG/ACT inhaler, Inhale 2 puffs into the lungs every 6 (six) hours as needed for wheezing or shortness of breath., Disp: 1 Inhaler, Rfl: 6 .  amLODipine (NORVASC) 5 MG tablet, Take 5 mg by mouth daily. , Disp: , Rfl:  .  Calcium Carbonate-Simethicone (ALKA-SELTZER HEARTBURN + GAS) 750-80 MG CHEW, Chew by mouth as needed., Disp: , Rfl:  .  Cholecalciferol (VITAMIN D PO), Take 1 capsule by mouth 2 (two) times daily., Disp: , Rfl:  .  ferrous sulfate 325 (65 FE) MG tablet, Take 325 mg by mouth daily with breakfast. , Disp: , Rfl:  .  furosemide (LASIX) 20 MG tablet, Take 1 tablet (20 mg total) by mouth daily as needed. (Patient taking differently: Take 20 mg by mouth daily. ), Disp: 30 tablet, Rfl: 11 .  irbesartan (AVAPRO) 300 MG tablet, Take 300 mg by mouth daily. , Disp: , Rfl:  .  ketoconazole (NIZORAL) 2 % shampoo, Apply 1 application topically every other day. , Disp: , Rfl:  .  metFORMIN (GLUCOPHAGE) 500 MG tablet, Take 500 mg by mouth 2 (two) times daily with a meal. , Disp: , Rfl:  .  metoprolol succinate (TOPROL-XL) 25 MG 24 hr tablet, Take 25 mg by mouth daily., Disp: , Rfl:  .  nitroGLYCERIN (NITROSTAT) 0.4 MG SL tablet, Place 1 tablet (0.4 mg total) under the tongue every 5 (five) minutes x 3 doses as needed for chest pain., Disp: 25 tablet, Rfl: 4 .  omeprazole (PRILOSEC) 40 MG capsule, Take 1 capsule (40 mg  total) by mouth daily., Disp: 90 capsule, Rfl: 3 .  OXYGEN, Inhale 2 L into the lungs continuous. , Disp: , Rfl:  .  rosuvastatin (CRESTOR) 10 MG tablet, Take 5 mg by mouth daily., Disp: , Rfl:  .  tamsulosin (FLOMAX) 0.4 MG CAPS capsule, Take 0.4 mg by mouth daily. , Disp: , Rfl:   Past Medical History: Past Medical History:  Diagnosis Date  . Chronic obstructive pulmonary disease ([place]) 2008   Moderate  . Colon polyps   . Community acquired pneumonia   . Coronary artery disease   . Degenerative joint disease   . Diabetes mellitus without complication ([place])    type 2  . Emphysema lung ([place])   . Enlarged liver    fatty liver by '09 CT  . Gastroesophageal reflux disease   . Gout   . H/O hiatal hernia   . History of [place] spotted fever    Possible history of [place] Spotted Fever  . Hyperlipidemia   . Hypertension   . Hypokalemia    diuretic induced, resolved  . Microcytic anemia    iron pills  . Morbid obesity ([place])   . Shortness of breath   . Skin cancer    skin cancer lip removed  . Sleep apnea    not currently using CPAP 05/20/13  . 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 Packs/day: 1.00  . Years: 35.00  . Pack years: 35.00  . Types: Cigarettes  . Last attempt to quit: 09/16/2006  . Years since quitting: 11.5  Smokeless Tobacco Never Used    Labs: Recent Chemical engineer    Labs for ITP Cardiac and Pulmonary Rehab Latest Ref Rng & Units 10/15/2011 01/14/2012 01/29/2014 05/08/2014   Cholestrol 0 - 200 mg/dL 167 82 88 -   LDLCALC 0 - 99 mg/dL 104(H) 25 32 -   HDL >39 mg/dL 41.00 36.40(L) 30(L) -   Trlycerides <150 mg/dL 112.0 102.0 130 -   Hemoglobin A1c <5.7 % - - - 6.6(H)       Pulmonary Assessment Scores: Pulmonary Assessment Scores    Row Name 01/20/18 1502         ADL UCSD   ADL Phase  Entry     SOB Score total  88       Rest  1     Walk  4     Stairs  4     Bath  5     Dress  3     Shop  5       CAT Score   CAT Score  23       mMRC Score   mMRC Score  4        Pulmonary Function Assessment: Pulmonary Function Assessment - 01/20/18 1439      Pulmonary Function Tests   FVC%  64 %    FEV1%  52 %      Breath   Bilateral Breath Sounds  Clear;Decreased    Shortness of Breath  Yes;Limiting activity       Exercise Target Goals:    Exercise Program Goal: Individual exercise prescription set using results from initial 6 min walk test and THRR while considering  patient's activity barriers and safety.    Exercise Prescription Goal: Initial exercise prescription builds to 30-45 minutes a day of aerobic activity, 2-3 days per week.  Home exercise guidelines will be given to patient during program as part of exercise prescription that the participant will acknowledge.  Activity Barriers & Risk Stratification: Activity Barriers & Cardiac Risk Stratification - 01/20/18 1531      Activity Barriers & Cardiac Risk Stratification   Activity Barriers  Deconditioning;Muscular Weakness;Balance Concerns;Shortness of Breath;History of Falls;Assistive Device uses cane for balance       6 Minute Walk: 6 Minute Walk    Row Name 01/20/18 1526         6 Minute Walk   Phase  Initial     Distance  142 feet     Walk Time  1.05 minutes test terminated due to desaturation     # of Rest Breaks  0     MPH  1.54     METS  0.09     RPE  13     Perceived Dyspnea   2     VO2 Peak  0.32     Symptoms  Yes (comment)     Comments  SOB, uses cane for balance     Resting HR  70 bpm     Resting BP  136/64     Resting Oxygen Saturation   90 %     Exercise Oxygen Saturation  during 6 min walk  73 %  Max Ex. HR  100 bpm     Max Ex. BP  168/74     2 Minute Post BP  156/70 140/74       Interval HR   1 Minute HR  100     2 Minute HR  99 test stopped at 1:03     3 Minute HR  91     4 Minute HR  77     6  Minute HR  68     Interval Heart Rate?  Yes       Interval Oxygen   Interval Oxygen?  Yes     Baseline Oxygen Saturation %  90 %     1 Minute Oxygen Saturation %  80 % test terminated at 1:03 73%     1 Minute Liters of Oxygen  2 L continuous     2 Minute Oxygen Saturation %  87 %     2 Minute Liters of Oxygen  2 L     4 Minute Oxygen Saturation %  95 %     4 Minute Liters of Oxygen  2 L     6 Minute Oxygen Saturation %  94 %     6 Minute Liters of Oxygen  2 L       Oxygen Initial Assessment: Oxygen Initial Assessment - 01/20/18 1441      Home Oxygen   Home Oxygen Device  Home Concentrator;E-Tanks    Sleep Oxygen Prescription  CPAP;Continuous    Liters per minute  2    Home Exercise Oxygen Prescription  Continuous    Liters per minute  2    Home at Rest Exercise Oxygen Prescription  Continuous    Liters per minute  2    Compliance with Home Oxygen Use  No does not wear CPAP      Initial 6 min Walk   Oxygen Used  Continuous    Liters per minute  2      Program Oxygen Prescription   Program Oxygen Prescription  Continuous    Liters per minute  2      Intervention   Short Term Goals  To learn and understand importance of monitoring SPO2 with pulse oximeter and demonstrate accurate use of the pulse oximeter.;To learn and demonstrate proper pursed lip breathing techniques or other breathing techniques.;To learn and demonstrate proper use of respiratory medications;To learn and understand importance of maintaining oxygen saturations>88%;To learn and exhibit compliance with exercise, home and travel O2 prescription    Long  Term Goals  Exhibits compliance with exercise, home and travel O2 prescription;Verbalizes importance of monitoring SPO2 with pulse oximeter and return demonstration;Maintenance of O2 saturations>88%;Exhibits proper breathing techniques, such as pursed lip breathing or other method taught during program session;Compliance with respiratory medication;Demonstrates  proper use of MDI's       Oxygen Re-Evaluation: Oxygen Re-Evaluation    Row Name 02/27/18 1149             Program Oxygen Prescription   Program Oxygen Prescription  Continuous       Liters per minute  4       Comments  4 liters for exercise         Home Oxygen   Home Oxygen Device  Home Concentrator;E-Tanks       Sleep Oxygen Prescription  CPAP;Continuous       Liters per minute  2       Home Exercise Oxygen Prescription  Continuous  Liters per minute  2       Home at Rest Exercise Oxygen Prescription  Continuous       Liters per minute  2       Compliance with Home Oxygen Use  No he states he cannot tolerate that.         Goals/Expected Outcomes   Short Term Goals  To learn and understand importance of monitoring SPO2 with pulse oximeter and demonstrate accurate use of the pulse oximeter.;To learn and demonstrate proper pursed lip breathing techniques or other breathing techniques.;To learn and demonstrate proper use of respiratory medications;To learn and understand importance of maintaining oxygen saturations>88%;To learn and exhibit compliance with exercise, home and travel O2 prescription       Long  Term Goals  Exhibits compliance with exercise, home and travel O2 prescription;Verbalizes importance of monitoring SPO2 with pulse oximeter and return demonstration;Maintenance of O2 saturations>88%;Exhibits proper breathing techniques, such as pursed lip breathing or other method taught during program session;Compliance with respiratory medication;Demonstrates proper use of MDI's       Comments  Miki does not take nebulizers and has a albuterol inhaler if needed. He needs to work on PLB at home a little more. He checks his oxygen often at home and tries to keep it above 88 percent.       Goals/Expected Outcomes  Short: work on PLB while on the treadmill. Long: Be independent with PLB          Oxygen Discharge (Final Oxygen Re-Evaluation): Oxygen Re-Evaluation - 02/27/18  1149      Program Oxygen Prescription   Program Oxygen Prescription  Continuous    Liters per minute  4    Comments  4 liters for exercise      Home Oxygen   Home Oxygen Device  Home Concentrator;E-Tanks    Sleep Oxygen Prescription  CPAP;Continuous    Liters per minute  2    Home Exercise Oxygen Prescription  Continuous    Liters per minute  2    Home at Rest Exercise Oxygen Prescription  Continuous    Liters per minute  2    Compliance with Home Oxygen Use  No he states he cannot tolerate that.      Goals/Expected Outcomes   Short Term Goals  To learn and understand importance of monitoring SPO2 with pulse oximeter and demonstrate accurate use of the pulse oximeter.;To learn and demonstrate proper pursed lip breathing techniques or other breathing techniques.;To learn and demonstrate proper use of respiratory medications;To learn and understand importance of maintaining oxygen saturations>88%;To learn and exhibit compliance with exercise, home and travel O2 prescription    Long  Term Goals  Exhibits compliance with exercise, home and travel O2 prescription;Verbalizes importance of monitoring SPO2 with pulse oximeter and return demonstration;Maintenance of O2 saturations>88%;Exhibits proper breathing techniques, such as pursed lip breathing or other method taught during program session;Compliance with respiratory medication;Demonstrates proper use of MDI's    Comments  Ashok does not take nebulizers and has a albuterol inhaler if needed. He needs to work on PLB at home a little more. He checks his oxygen often at home and tries to keep it above 88 percent.    Goals/Expected Outcomes  Short: work on PLB while on the treadmill. Long: Be independent with PLB       Initial Exercise Prescription: Initial Exercise Prescription - 01/20/18 1500      Date of Initial Exercise RX and Referring Provider   Date  01/20/18  Referring Provider  Christinia Gully MD      Oxygen   Oxygen  Continuous      Liters  4      Treadmill   MPH  0.8    Grade  0    Minutes  15    METs  1.6      T5 Nustep   Level  1    SPM  80    Minutes  15    METs  1.5      Biostep-RELP   Level  1    SPM  40    Minutes  15    METs  2      Prescription Details   Frequency (times per week)  3    Duration  Progress to 45 minutes of aerobic exercise without signs/symptoms of physical distress      Intensity   THRR 40-80% of Max Heartrate  103-136    Ratings of Perceived Exertion  11-13    Perceived Dyspnea  0-4      Progression   Progression  Continue to progress workloads to maintain intensity without signs/symptoms of physical distress.      Resistance Training   Training Prescription  Yes    Weight  3 lbs    Reps  10-15       Perform Capillary Blood Glucose checks as needed.  Exercise Prescription Changes: Exercise Prescription Changes    Row Name 01/20/18 1500 02/03/18 1400 02/19/18 1400 02/25/18 1200 03/03/18 1400     Response to Exercise   Blood Pressure (Admit)  136/64  134/84  124/62  -  140/80   Blood Pressure (Exercise)  168/74  160/76  -  -  -   Blood Pressure (Exit)  140/74  124/74  102/62  -  128/68   Heart Rate (Admit)  70 bpm  67 bpm  65 bpm  -  62 bpm   Heart Rate (Exercise)  100 bpm  112 bpm  94 bpm  -  97 bpm   Heart Rate (Exit)  68 bpm  70 bpm  78 bpm  -  82 bpm   Oxygen Saturation (Admit)  90 %  95 %  93 %  -  95 %   Oxygen Saturation (Exercise)  73 %  88 %  90 %  -  89 %   Oxygen Saturation (Exit)  94 %  94 %  94 %  -  93 %   Rating of Perceived Exertion (Exercise)  '13  15  16  '$ -  15   Perceived Dyspnea (Exercise)  '2  3  4  '$ -  3   Symptoms  SOB  SOB  SOB  -  SOB on treadmill   Comments  walk test results, uses cane  second full day of exercise  -  -  -   Duration  -  Progress to 45 minutes of aerobic exercise without signs/symptoms of physical distress  Continue with 45 min of aerobic exercise without signs/symptoms of physical distress.  -  Continue with 45 min of  aerobic exercise without signs/symptoms of physical distress.   Intensity  -  THRR unchanged  THRR unchanged  -  THRR unchanged     Progression   Progression  -  Continue to progress workloads to maintain intensity without signs/symptoms of physical distress.  Continue to progress workloads to maintain intensity without signs/symptoms of physical distress.  -  Continue to progress workloads to  maintain intensity without signs/symptoms of physical distress.   Average METs  -  1.83  1.8  -  2.23     Resistance Training   Training Prescription  -  Yes  Yes  -  Yes   Weight  -  3 lbs  3 lb  -  4 lbs   Reps  -  10-15  10-15  -  10-15     Interval Training   Interval Training  -  No  No  -  No     Oxygen   Oxygen  -  Continuous  Continuous  -  Continuous   Liters  -  4  4  -  4     Treadmill   MPH  -  0.8  0.8  -  0.8   Grade  -  0  0  -  0   Minutes  -  6 4 min, 2 min  6 6/3/5  -  15   METs  -  1.6  1.6  -  1.6     T5 Nustep   Level  -  1  -  -  3   SPM  -  78  -  -  76   Minutes  -  15  -  -  15   METs  -  1.9  -  -  2.1     Biostep-RELP   Level  -  1  2  -  4   SPM  -  44  42  -  44   Minutes  -  15  15  -  15   METs  -  2  2  -  3     Home Exercise Plan   Plans to continue exercise at  -  -  -  Home (comment) walking, chair exercises  Home (comment) walking, chair exercises   Frequency  -  -  -  Add 1 additional day to program exercise sessions.  Add 1 additional day to program exercise sessions.   Initial Home Exercises Provided  -  -  -  02/25/18  02/25/18      Exercise Comments: Exercise Comments    Row Name 01/26/18 1146           Exercise Comments  First full day of exercise!  Patient was oriented to gym and equipment including functions, settings, policies, and procedures.  Patient's individual exercise prescription and treatment plan were reviewed.  All starting workloads were established based on the results of the 6 minute walk test done at initial  orientation visit.  The plan for exercise progression was also introduced and progression will be customized based on patient's performance and goals.          Exercise Goals and Review: Exercise Goals    Row Name 01/20/18 1539             Exercise Goals   Increase Physical Activity  Yes       Intervention  Provide advice, education, support and counseling about physical activity/exercise needs.;Develop an individualized exercise prescription for aerobic and resistive training based on initial evaluation findings, risk stratification, comorbidities and participant's personal goals.       Expected Outcomes  Short Term: Attend rehab on a regular basis to increase amount of physical activity.;Long Term: Add in home exercise to make exercise part of routine and to increase amount of physical activity.;Long Term: Exercising regularly at  least 3-5 days a week.       Increase Strength and Stamina  Yes       Intervention  Provide advice, education, support and counseling about physical activity/exercise needs.;Develop an individualized exercise prescription for aerobic and resistive training based on initial evaluation findings, risk stratification, comorbidities and participant's personal goals.       Expected Outcomes  Short Term: Increase workloads from initial exercise prescription for resistance, speed, and METs.;Short Term: Perform resistance training exercises routinely during rehab and add in resistance training at home;Long Term: Improve cardiorespiratory fitness, muscular endurance and strength as measured by increased METs and functional capacity (6MWT)       Able to understand and use rate of perceived exertion (RPE) scale  Yes       Intervention  Provide education and explanation on how to use RPE scale       Expected Outcomes  Short Term: Able to use RPE daily in rehab to express subjective intensity level;Long Term:  Able to use RPE to guide intensity level when exercising independently        Able to understand and use Dyspnea scale  Yes       Intervention  Provide education and explanation on how to use Dyspnea scale       Expected Outcomes  Short Term: Able to use Dyspnea scale daily in rehab to express subjective sense of shortness of breath during exertion;Long Term: Able to use Dyspnea scale to guide intensity level when exercising independently       Knowledge and understanding of Target Heart Rate Range (THRR)  Yes       Intervention  Provide education and explanation of THRR including how the numbers were predicted and where they are located for reference       Expected Outcomes  Short Term: Able to state/look up THRR;Short Term: Able to use daily as guideline for intensity in rehab;Long Term: Able to use THRR to govern intensity when exercising independently       Able to check pulse independently  Yes       Intervention  Provide education and demonstration on how to check pulse in carotid and radial arteries.;Review the importance of being able to check your own pulse for safety during independent exercise       Expected Outcomes  Short Term: Able to explain why pulse checking is important during independent exercise;Long Term: Able to check pulse independently and accurately       Understanding of Exercise Prescription  Yes       Intervention  Provide education, explanation, and written materials on patient's individual exercise prescription       Expected Outcomes  Short Term: Able to explain program exercise prescription;Long Term: Able to explain home exercise prescription to exercise independently          Exercise Goals Re-Evaluation : Exercise Goals Re-Evaluation    Row Name 01/26/18 1146 02/03/18 1439 02/19/18 1434 02/25/18 1217 03/03/18 1447     Exercise Goal Re-Evaluation   Exercise Goals Review  Increase Physical Activity;Able to understand and use Dyspnea scale;Increase Strength and Stamina;Knowledge and understanding of Target Heart Rate Range (THRR);Able  to understand and use rate of perceived exertion (RPE) scale  Increase Physical Activity;Understanding of Exercise Prescription;Increase Strength and Stamina  Increase Physical Activity;Able to understand and use rate of perceived exertion (RPE) scale;Able to understand and use Dyspnea scale  Increase Physical Activity;Able to understand and use rate of perceived exertion (RPE) scale;Able to understand  and use Dyspnea scale;Knowledge and understanding of Target Heart Rate Range (THRR);Understanding of Exercise Prescription;Increase Strength and Stamina;Able to check pulse independently  Increase Physical Activity;Understanding of Exercise Prescription;Increase Strength and Stamina   Comments  Reviewed RPE scale, THR and program prescription with pt today.  Pt voiced understanding and was given a copy of goals to take home.   Jachin is off to a good start in rehab.  He has only attend two full days of exercise due to other medical appointments he had scheduled. He will do well if he is able to come consistently to class each day.  We will continue to monitor his progress.   Pt has increased total walk without break from 4 to 6 minutes.  Staff will monitor progress.  Reviewed home exercise with pt today.  Pt plans to walk and do chair exercises at home for exercise.  He will start with one extra day a week.  Reviewed THR, pulse, RPE, sign and symptoms, NTG use, and when to call 911 or MD.  Also discussed weather considerations and indoor options.  Pt voiced understanding.  Shivam has been doing well in rehab.  He is now up to 4 lbs weights.  He is only averaging about two days a week and would benefit from coming all three days consistently.  He has also increased workloads on the BioStep and T5 NuStep.  We will continue to monitor his progression.    Expected Outcomes  Short: Use RPE daily to regulate intensity.  Long: Follow program prescription in THR.  Short: Attend rehab regularly.  Long: Continue to follow program  prescription.   Short - pt will walk 8-10 min without stopping Long - Pt will walk 15 min without stopping  Short: Add in at least one extra day a week at home.  Long: Continue to exercise independently.   Short: Attend class regularly.  Long: Continue to exercise more at home.       Discharge Exercise Prescription (Final Exercise Prescription Changes): Exercise Prescription Changes - 03/03/18 1400      Response to Exercise   Blood Pressure (Admit)  140/80    Blood Pressure (Exit)  128/68    Heart Rate (Admit)  62 bpm    Heart Rate (Exercise)  97 bpm    Heart Rate (Exit)  82 bpm    Oxygen Saturation (Admit)  95 %    Oxygen Saturation (Exercise)  89 %    Oxygen Saturation (Exit)  93 %    Rating of Perceived Exertion (Exercise)  15    Perceived Dyspnea (Exercise)  3    Symptoms  SOB on treadmill    Duration  Continue with 45 min of aerobic exercise without signs/symptoms of physical distress.    Intensity  THRR unchanged      Progression   Progression  Continue to progress workloads to maintain intensity without signs/symptoms of physical distress.    Average METs  2.23      Resistance Training   Training Prescription  Yes    Weight  4 lbs    Reps  10-15      Interval Training   Interval Training  No      Oxygen   Oxygen  Continuous    Liters  4      Treadmill   MPH  0.8    Grade  0    Minutes  15    METs  1.6      T5 Nustep  Level  3    SPM  76    Minutes  15    METs  2.1      Biostep-RELP   Level  4    SPM  44    Minutes  15    METs  3      Home Exercise Plan   Plans to continue exercise at  Home (comment) walking, chair exercises    Frequency  Add 1 additional day to program exercise sessions.    Initial Home Exercises Provided  02/25/18       Nutrition:  Target Goals: Understanding of nutrition guidelines, daily intake of sodium '1500mg'$ , cholesterol '200mg'$ , calories 30% from fat and 7% or less from saturated fats, daily to have 5 or more servings of  fruits and vegetables.  Biometrics: Pre Biometrics - 01/20/18 1540      Pre Biometrics   Height  '5\' 9"'$  (1.753 m)    Weight  318 lb 14.4 oz (144.7 kg)  (Abnormal)     Waist Circumference  56.5 inches    Hip Circumference  46.5 inches    Waist to Hip Ratio  1.22 %    BMI (Calculated)  47.07    Single Leg Stand  1.93 seconds        Nutrition Therapy Plan and Nutrition Goals: Nutrition Therapy & Goals - 03/04/18 1055      Nutrition Therapy   Diet  DM/ TLC    Drug/Food Interactions  Purine/Gout    Protein (specify units)  13oz    Fiber  30 grams    Whole Grain Foods  3 servings    Saturated Fats  16 max. grams    Fruits and Vegetables  5 servings/day 8 ideal. eats 2 meals per day currently    Sodium  1500 grams      Personal Nutrition Goals   Nutrition Goal  Increase fluid intake throughout the day, aiming for 48-64oz total to help with fluid retention and thinning mucus Currently drinks 2 bottles of water per day plus 1-3 diet sodas    Personal Goal #2  Continue to choose lower sodium options for snack foods. When eating out, use the guidelines provided to help you order foods that are lower in sodium and fat    Personal Goal #3  Consider eating dinner at home once per week, slowly progressing to eating more meals at home over the course of the week which will help better control parameters like sodium, fat, portion sizes and total calories    Comments  He and his wife currently eat out most if not all nights of the week. He does not eat lunch but he does try to limit dietary sources of added sugar and sodium. He has also been working to reduce his portion sizes and to choose more vegetables at supper      Intervention Plan   Intervention  Nutrition handout(s) given to patient.;Prescribe, educate and counsel regarding individualized specific dietary modifications aiming towards targeted core components such as weight, hypertension, lipid management, diabetes, heart failure and other  comorbidities. COPD handout, Eating out handout, Low sodium nutrition therapy handout    Expected Outcomes  Short Term Goal: Understand basic principles of dietary content, such as calories, fat, sodium, cholesterol and nutrients.;Short Term Goal: A plan has been developed with personal nutrition goals set during dietitian appointment.;Long Term Goal: Adherence to prescribed nutrition plan.       Nutrition Assessments:   Nutrition Goals Re-Evaluation: Nutrition Goals  Re-Evaluation    Row Name 02/27/18 1154 03/04/18 1104           Goals   Current Weight  317 lb (143.8 kg)  -      Nutrition Goal  Roddy want sto lose 100 pounds total. Try not to overeat.  Consider eating dinner at home once per week, slowly progressing to eating more meals at home over the course of the week which will help better control parameters like sodium, fat, portion sizes and total calories      Comment  Kaien has lost 5 pounds and wants to lose at least 20 pounds before the program is over.  He and his wife currently eat out most if not all nights of the week. They order a variety of meals, some which are high in sodium and fat      Expected Outcome  Short: lose 5 pounds within the next week. Long: lose 20 pounds by the end of [facility]  He will eat supper at home at least one additional meal per week        Personal Goal #2 Re-Evaluation   Personal Goal #2  -  Increase fluid intake throughout the day, aiming for 48-64oz total to help with fluid retention and thinning mucus        Personal Goal #3 Re-Evaluation   Personal Goal #3  -  Continue to choose lower sodium options for snack foods. When eating out, use the guidelines provided to help you order foods that are lower in sodium and fat         Nutrition Goals Discharge (Final Nutrition Goals Re-Evaluation): Nutrition Goals Re-Evaluation - 03/04/18 1104      Goals   Nutrition Goal  Consider eating dinner at home once per week, slowly progressing to eating  more meals at home over the course of the week which will help better control parameters like sodium, fat, portion sizes and total calories    Comment  He and his wife currently eat out most if not all nights of the week. They order a variety of meals, some which are high in sodium and fat    Expected Outcome  He will eat supper at home at least one additional meal per week      Personal Goal #2 Re-Evaluation   Personal Goal #2  Increase fluid intake throughout the day, aiming for 48-64oz total to help with fluid retention and thinning mucus      Personal Goal #3 Re-Evaluation   Personal Goal #3  Continue to choose lower sodium options for snack foods. When eating out, use the guidelines provided to help you order foods that are lower in sodium and fat       Psychosocial: Target Goals: Acknowledge presence or absence of significant depression and/or stress, maximize coping skills, provide positive support system. Participant is able to verbalize types and ability to use techniques and skills needed for reducing stress and depression.   Initial Review & Psychosocial Screening: Initial Psych Review & Screening - 01/20/18 1437      Initial Review   Current issues with  Current Sleep Concerns;Current Stress Concerns    Source of Stress Concerns  Chronic Illness;Unable to perform yard/household activities    Comments  COPD is most stressful issue      [place]?  Yes    Comments  He can look to his wife and children for support.      Barriers  Psychosocial barriers to participate in program  The patient should benefit from training in stress management and relaxation.      Screening Interventions   Interventions  Encouraged to exercise;Program counselor consult;To provide support and resources with identified psychosocial needs;Provide feedback about the scores to participant    Expected Outcomes  Short Term goal: Utilizing psychosocial counselor, staff and  physician to assist with identification of specific Stressors or current issues interfering with healing process. Setting desired goal for each stressor or current issue identified.;Long Term Goal: Stressors or current issues are controlled or eliminated.;Short Term goal: Identification and review with participant of any Quality of Life or Depression concerns found by scoring the questionnaire.;Long Term goal: The participant improves quality of Life and PHQ9 Scores as seen by post scores and/or verbalization of changes       Quality of Life Scores:  Scores of 19 and below usually indicate a poorer quality of life in these areas.  A difference of  2-3 points is a clinically meaningful difference.  A difference of 2-3 points in the total score of the Quality of Life Index has been associated with significant improvement in overall quality of life, self-image, physical symptoms, and general health in studies assessing change in quality of life.  PHQ-9: Recent Review Flowsheet Data    Depression screen [facility] 2/9 01/20/2018 01/19/2015 07/19/2014 05/31/2014 05/31/2014   Decreased Interest 1 0 0 0 0   Down, Depressed, Hopeless 0 0 0 0 0   PHQ - 2 Score 1 0 0 0 0   Altered sleeping 1 - - - -   Tired, decreased energy 2 - - - -   Change in appetite 0 - - - -   Feeling bad or failure about yourself  1 - - - -   Trouble concentrating 2 - - - -   Moving slowly or fidgety/restless 0 - - - -   Suicidal thoughts 0 - - - -   PHQ-9 Score 7 - - - -   Difficult doing work/chores Somewhat difficult - - - -     Interpretation of Total Score  Total Score Depression Severity:  1-4 = Minimal depression, 5-9 = Mild depression, 10-14 = Moderate depression, 15-19 = Moderately severe depression, 20-27 = Severe depression   Psychosocial Evaluation and Intervention:   Psychosocial Re-Evaluation: Psychosocial Re-Evaluation    Luis Lopez Name 02/27/18 1158             Psychosocial Re-Evaluation   Current issues with   Current Sleep Concerns;Current Stress Concerns       Comments  Jamichael has been sleeping better since he has started exercising. He cannot tolerate his CPAP so he does not wear it. He is only stressed with his COPD and not being able to breath at times.        Expected Outcomes  Short: attend [facility] regularly to improve stress. Long: maintain exercise to keep stress at a minimum.       Interventions  Encouraged to attend [facility] for the exercise       Continue Psychosocial Services   Follow up required by staff          Psychosocial Discharge (Final Psychosocial Re-Evaluation): Psychosocial Re-Evaluation - 02/27/18 1158      Psychosocial Re-Evaluation   Current issues with  Current Sleep Concerns;Current Stress Concerns    Comments  Landrum has been sleeping better since he has started exercising. He cannot tolerate his CPAP so he does not  wear it. He is only stressed with his COPD and not being able to breath at times.     Expected Outcomes  Short: attend [facility] regularly to improve stress. Long: maintain exercise to keep stress at a minimum.    Interventions  Encouraged to attend [facility] for the exercise    Continue Psychosocial Services   Follow up required by staff       Education: Education Goals: Education classes will be provided on a weekly basis, covering required topics. Participant will state understanding/return demonstration of topics presented.  Learning Barriers/Preferences: Learning Barriers/Preferences - 01/20/18 1441      Learning Barriers/Preferences   Learning Barriers  Hearing    Learning Preferences  None       Education Topics:  Initial Evaluation Education: - Verbal, written and demonstration of respiratory meds, oximetry and breathing techniques. Instruction on use of nebulizers and MDIs and importance of monitoring MDI activations.   Pulmonary Rehab from 03/11/2018 in [facility] Cardiac and Pulmonary Rehab  Date  01/20/18    Educator  [facility]  Instruction Review Code  1- Verbalizes Understanding      General Nutrition Guidelines/Fats and Fiber: -Group instruction provided by verbal, written material, models and posters to present the general guidelines for heart healthy nutrition. Gives an explanation and review of dietary fats and fiber.   Controlling Sodium/Reading Food Labels: -Group verbal and written material supporting the discussion of sodium use in heart healthy nutrition. Review and explanation with models, verbal and written materials for utilization of the food label.   Pulmonary Rehab from 03/11/2018 in [facility] Cardiac and Pulmonary Rehab  Date  02/16/18  Educator  [facility]  Instruction Review Code  1- Verbalizes Understanding      Exercise Physiology & General Exercise Guidelines: - Group verbal and written instruction with models to review the exercise physiology of the cardiovascular system and associated critical values. Provides general exercise guidelines with specific guidelines to those with heart or lung disease.    Pulmonary Rehab from 03/11/2018 in [facility] Cardiac and Pulmonary Rehab  Date  03/06/18  Educator  [facility]  Instruction Review Code  1- Verbalizes Understanding      Aerobic Exercise & Resistance Training: - Gives group verbal and written instruction on the various components of exercise. Focuses on aerobic and resistive training programs and the benefits of this training and how to safely progress through these programs.   Flexibility, Balance, Mind/Body Relaxation: Provides group verbal/written instruction on the benefits of flexibility and balance training, including mind/body exercise modes such as yoga, pilates and tai chi.  Demonstration and skill practice provided.   Stress and Anxiety: - Provides group verbal and written instruction about the health risks of elevated stress and causes of high stress.  Discuss the correlation between heart/lung disease and anxiety and treatment  options. Review healthy ways to manage with stress and anxiety.   Depression: - Provides group verbal and written instruction on the correlation between heart/lung disease and depressed mood, treatment options, and the stigmas associated with seeking treatment.   Exercise & Equipment Safety: - Individual verbal instruction and demonstration of equipment use and safety with use of the equipment.   Pulmonary Rehab from 03/11/2018 in [facility] Cardiac and Pulmonary Rehab  Date  01/20/18  Educator  [facility]  Instruction Review Code  1- Verbalizes Understanding      Infection Prevention: - Provides verbal and written material to individual with discussion of infection control including proper hand washing and proper equipment cleaning during  exercise session.   Pulmonary Rehab from 03/11/2018 in [facility] Cardiac and Pulmonary Rehab  Date  01/20/18  Educator  [facility]  Instruction Review Code  1- Verbalizes Understanding      Falls Prevention: - Provides verbal and written material to individual with discussion of falls prevention and safety.   Pulmonary Rehab from 03/11/2018 in [facility] Cardiac and Pulmonary Rehab  Date  01/20/18  Educator  [facility]  Instruction Review Code  1- Verbalizes Understanding      Diabetes: - Individual verbal and written instruction to review signs/symptoms of diabetes, desired ranges of glucose level fasting, after meals and with exercise. Advice that pre and post exercise glucose checks will be done for 3 sessions at entry of program.   Chronic Lung Diseases: - Group verbal and written instruction to review updates, respiratory medications, advancements in procedures and treatments. Discuss use of supplemental oxygen including available portable oxygen systems, continuous and intermittent flow rates, concentrators, personal use and safety guidelines. Review proper use of inhaler and spacers. Provide informative websites for self-education.    Pulmonary Rehab from 03/11/2018 in [facility]  Cardiac and Pulmonary Rehab  Date  03/04/18  Educator  [facility]  Instruction Review Code  1- Verbalizes Understanding      Energy Conservation: - Provide group verbal and written instruction for methods to conserve energy, plan and organize activities. Instruct on pacing techniques, use of adaptive equipment and posture/positioning to relieve shortness of breath.   Pulmonary Rehab from 03/11/2018 in [facility] Cardiac and Pulmonary Rehab  Date  02/04/18  Educator  [facility]  Instruction Review Code  1- Verbalizes Understanding      Triggers and Exacerbations: - Group verbal and written instruction to review types of environmental triggers and ways to prevent exacerbations. Discuss weather changes, air quality and the benefits of nasal washing. Review warning signs and symptoms to help prevent infections. Discuss techniques for effective airway clearance, coughing, and vibrations.   AED/CPR: - Group verbal and written instruction with the use of models to demonstrate the basic use of the AED with the basic ABC's of resuscitation.   Anatomy and Physiology of the Lungs: - Group verbal and written instruction with the use of models to provide basic lung anatomy and physiology related to function, structure and complications of lung disease.   Pulmonary Rehab from 03/11/2018 in [facility] Cardiac and Pulmonary Rehab  Date  02/18/18  Educator  [facility]  Instruction Review Code  1- Verbalizes Understanding      Anatomy & Physiology of the Heart: - Group verbal and written instruction and models provide basic cardiac anatomy and physiology, with the coronary electrical and arterial systems. Review of Valvular disease and Heart Failure   Cardiac Medications: - Group verbal and written instruction to review commonly prescribed medications for heart disease. Reviews the medication, class of the drug, and side effects.   Know Your Numbers and Risk Factors: -Group verbal and written instruction about important numbers  in your health.  Discussion of what are risk factors and how they play a role in the disease process.  Review of Cholesterol, Blood Pressure, Diabetes, and BMI and the role they play in your overall health.   Sleep Hygiene: -Provides group verbal and written instruction about how sleep can affect your health.  Define sleep hygiene, discuss sleep cycles and impact of sleep habits. Review good sleep hygiene tips.    Pulmonary Rehab from 03/11/2018 in [facility] Cardiac and Pulmonary Rehab  Date  03/11/18  Educator  [facility]  Instruction  Review Code  1- Verbalizes Understanding      Other: -Provides group and verbal instruction on various topics (see comments)    Knowledge Questionnaire Score: Knowledge Questionnaire Score - 01/20/18 1440      Knowledge Questionnaire Score   Pre Score  13/18 reviewed with patient        Core Components/Risk Factors/Patient Goals at Admission: Personal Goals and Risk Factors at Admission - 01/20/18 1443      Core Components/Risk Factors/Patient Goals on Admission    Weight Management  Yes;Obesity;Weight Loss    Intervention  Weight Management: Develop a combined nutrition and exercise program designed to reach desired caloric intake, while maintaining appropriate intake of nutrient and fiber, sodium and fats, and appropriate energy expenditure required for the weight goal.;Weight Management: Provide education and appropriate resources to help participant work on and attain dietary goals.;Weight Management/Obesity: Establish reasonable short term and long term weight goals.    Admit Weight  318 lb 14.4 oz (144.7 kg)    Goal Weight: Short Term  313 lb (142 kg)    Goal Weight: Long Term  200 lb (90.7 kg)    Expected Outcomes  Short Term: Continue to assess and modify interventions until short term weight is achieved;Long Term: Adherence to nutrition and physical activity/exercise program aimed toward attainment of established weight goal;Weight Maintenance:  Understanding of the daily nutrition guidelines, which includes 25-35% calories from fat, 7% or less cal from saturated fats, less than '200mg'$  cholesterol, less than 1.5gm of sodium, & 5 or more servings of fruits and vegetables daily;Weight Loss: Understanding of general recommendations for a balanced deficit meal plan, which promotes 1-2 lb weight loss per week and includes a negative energy balance of [phone] kcal/d;Understanding recommendations for meals to include 15-35% energy as protein, 25-35% energy from fat, 35-60% energy from carbohydrates, less than '200mg'$  of dietary cholesterol, 20-35 gm of total fiber daily;Understanding of distribution of calorie intake throughout the day with the consumption of 4-5 meals/snacks    Improve shortness of breath with ADL's  Yes    Intervention  Provide education, individualized exercise plan and daily activity instruction to help decrease symptoms of SOB with activities of daily living.    Expected Outcomes  Short Term: Improve cardiorespiratory fitness to achieve a reduction of symptoms when performing ADLs;Long Term: Be able to perform more ADLs without symptoms or delay the onset of symptoms    Diabetes  Yes    Intervention  Provide education about signs/symptoms and action to take for hypo/hyperglycemia.;Provide education about proper nutrition, including hydration, and aerobic/resistive exercise prescription along with prescribed medications to achieve blood glucose in normal ranges: Fasting glucose 65-99 mg/dL    Expected Outcomes  Short Term: Participant verbalizes understanding of the signs/symptoms and immediate care of hyper/hypoglycemia, proper foot care and importance of medication, aerobic/resistive exercise and nutrition plan for blood glucose control.;Long Term: Attainment of HbA1C < 7%.    Heart Failure  Yes    Intervention  Provide a combined exercise and nutrition program that is supplemented with education, support and counseling about heart  failure. Directed toward relieving symptoms such as shortness of breath, decreased exercise tolerance, and extremity edema.    Expected Outcomes  Improve functional capacity of life;Short term: Attendance in program 2-3 days a week with increased exercise capacity. Reported lower sodium intake. Reported increased fruit and vegetable intake. Reports medication compliance.;Long term: Adoption of self-care skills and reduction of barriers for early signs and symptoms recognition and intervention leading to self-care  maintenance.;Short term: Daily weights obtained and reported for increase. Utilizing diuretic protocols set by physician.    Hypertension  Yes    Intervention  Provide education on lifestyle modifcations including regular physical activity/exercise, weight management, moderate sodium restriction and increased consumption of fresh fruit, vegetables, and low fat dairy, alcohol moderation, and smoking cessation.;Monitor prescription use compliance.    Expected Outcomes  Short Term: Continued assessment and intervention until BP is < 140/59m HG in hypertensive participants. < 130/860mHG in hypertensive participants with diabetes, heart failure or chronic kidney disease.;Long Term: Maintenance of blood pressure at goal levels.    Lipids  Yes    Intervention  Provide education and support for participant on nutrition & aerobic/resistive exercise along with prescribed medications to achieve LDL '70mg'$ , HDL >'40mg'$ .    Expected Outcomes  Long Term: Cholesterol controlled with medications as prescribed, with individualized exercise RX and with personalized nutrition plan. Value goals: LDL < '70mg'$ , HDL > 40 mg.;Short Term: Participant states understanding of desired cholesterol values and is compliant with medications prescribed. Participant is following exercise prescription and nutrition guidelines.       Core Components/Risk Factors/Patient Goals Review:  Goals and Risk Factor Review    Row Name  02/27/18 1144             Core Components/Risk Factors/Patient Goals Review   Personal Goals Review  Weight Management/Obesity;Diabetes;Hypertension;Lipids;Heart Failure;Improve shortness of breath with ADL's       Review  JoDeaveonas lose 5 pounds since starting the program. He wants to continue to lose weight. His blood pressure has been stable and within normal limits. He takes blood pressure medication twice a day. He has not had his lipids checked in about six months. He is due for a check up on his lipids soon. Drelyn can walk to the car now without getting too short of breath.        Expected Outcomes  Short: work on weight loss and get lipids checked. Long: lose 20 pounds.          Core Components/Risk Factors/Patient Goals at Discharge (Final Review):  Goals and Risk Factor Review - 02/27/18 1144      Core Components/Risk Factors/Patient Goals Review   Personal Goals Review  Weight Management/Obesity;Diabetes;Hypertension;Lipids;Heart Failure;Improve shortness of breath with ADL's    Review  JoBalianas lose 5 pounds since starting the program. He wants to continue to lose weight. His blood pressure has been stable and within normal limits. He takes blood pressure medication twice a day. He has not had his lipids checked in about six months. He is due for a check up on his lipids soon. Keyden can walk to the car now without getting too short of breath.     Expected Outcomes  Short: work on weight loss and get lipids checked. Long: lose 20 pounds.       ITP Comments: ITP Comments    Row Name 01/20/18 1414 02/16/18 0840 03/16/18 1527       ITP Comments  Medical Evaluation completed. Chart sent for review and changes to Dr. MaEmily Filbertirector of LuRuddDiagnosis can be found in [facility] encounter 01/05/18   30 day review completed. ITP sent to Dr. MaEmily Filbertirector of LuSnyderContinue with ITP unless changes are made by physician   30 day review completed. ITP sent to Dr. MaEmily FilbertDirector of LuWetzelContinue with ITP unless changes are made by physician        Comments: 30  day review

## 2018-03-16 NOTE — Progress Notes (Signed)
Daily Session Note  Patient Details  Name: Alfred Carney MRN: [id number] Date of Birth: 07-31-1951 Referring Provider:     Pulmonary Rehab from 01/20/2018 in [facility] Cardiac and Pulmonary Rehab  Referring Provider  Christinia Gully MD      Encounter Date: 03/16/2018  Check In: Session Check In - 03/16/18 1220      Check-In   Location  [facility]-Cardiac & Pulmonary Rehab    Staff Present  Justin Mend RCP,RRT,BSRT;Nana Addai, RN BSN;Laureen Janell Quiet, RRT, Respiratory Therapist    Supervising physician immediately available to respond to emergencies  [facility] immediately available ER MD    Physician(s)  Dr. Corky Downs and Jimmye Norman    Medication changes reported      No    Fall or balance concerns reported     No    Tobacco Cessation  No Change    Warm-up and Cool-down  Performed as group-led instruction    Resistance Training Performed  Yes    VAD Patient?  No      Pain Assessment   Currently in Pain?  No/denies          Social History   Tobacco Use  Smoking Status Former Smoker  . Packs/day: 1.00  . Years: 35.00  . Pack years: 35.00  . Types: Cigarettes  . Last attempt to quit: 09/16/2006  . Years since quitting: 11.5  Smokeless Tobacco Never Used    Goals Met:  Independence with exercise equipment Exercise tolerated well No report of cardiac concerns or symptoms Strength training completed today  Goals Unmet:  Not Applicable  Comments: Pt able to follow exercise prescription today without complaint.  Will continue to monitor for progression.   Dr. Emily Filbert is Medical Director for [place] and [facility].

## 2018-03-18 ENCOUNTER — Encounter: Payer: Medicare HMO | Admitting: *Deleted

## 2018-03-18 DIAGNOSIS — E119 Type 2 diabetes mellitus without complications: Secondary | ICD-10-CM | POA: Diagnosis not present

## 2018-03-18 DIAGNOSIS — Z9981 Dependence on supplemental oxygen: Secondary | ICD-10-CM | POA: Diagnosis not present

## 2018-03-18 DIAGNOSIS — Z7982 Long term (current) use of aspirin: Secondary | ICD-10-CM | POA: Diagnosis not present

## 2018-03-18 DIAGNOSIS — I10 Essential (primary) hypertension: Secondary | ICD-10-CM | POA: Diagnosis not present

## 2018-03-18 DIAGNOSIS — I2510 Atherosclerotic heart disease of native coronary artery without angina pectoris: Secondary | ICD-10-CM | POA: Diagnosis not present

## 2018-03-18 DIAGNOSIS — M1990 Unspecified osteoarthritis, unspecified site: Secondary | ICD-10-CM | POA: Diagnosis not present

## 2018-03-18 DIAGNOSIS — Z7984 Long term (current) use of oral hypoglycemic drugs: Secondary | ICD-10-CM | POA: Diagnosis not present

## 2018-03-18 DIAGNOSIS — J449 Chronic obstructive pulmonary disease, unspecified: Secondary | ICD-10-CM | POA: Diagnosis not present

## 2018-03-18 DIAGNOSIS — E785 Hyperlipidemia, unspecified: Secondary | ICD-10-CM | POA: Diagnosis not present

## 2018-03-18 DIAGNOSIS — Z79899 Other long term (current) drug therapy: Secondary | ICD-10-CM | POA: Diagnosis not present

## 2018-03-18 NOTE — Progress Notes (Signed)
Daily Session Note  Patient Details  Name: Alfred Carney MRN: [id number] Date of Birth: 1951/04/16 Referring Provider:     Pulmonary Rehab from 01/20/2018 in [facility] Cardiac and Pulmonary Rehab  Referring Provider  Christinia Gully MD      Encounter Date: 03/18/2018  Check In: Session Check In - 03/18/18 1133      Check-In   Location  [facility]Cardiac & Pulmonary Rehab    Staff Present  Renita Papa, RN BSN;Dinita Migliaccio Luan Pulling, MA, RCEP, CCRP, Exercise Physiologist;Joseph Flavia Shipper    Supervising physician immediately available to respond to emergencies  [facility] immediately available ER MD    Physician(s)  Drs. Jimmye Norman and [place]    Medication changes reported      No    Fall or balance concerns reported     No    Warm-up and Cool-down  Performed as group-led Higher education careers adviser Performed  Yes    VAD Patient?  No    PAD/SET Patient?  No      Pain Assessment   Currently in Pain?  No/denies        Exercise Prescription Changes - 03/17/18 1500      Response to Exercise   Blood Pressure (Admit)  128/70    Blood Pressure (Exit)  132/70    Heart Rate (Admit)  64 bpm    Heart Rate (Exercise)  89 bpm    Heart Rate (Exit)  74 bpm    Oxygen Saturation (Admit)  96 %    Oxygen Saturation (Exercise)  88 %    Oxygen Saturation (Exit)  95 %    Rating of Perceived Exertion (Exercise)  14    Perceived Dyspnea (Exercise)  3    Symptoms  SOB on treadmill    Duration  Continue with 45 min of aerobic exercise without signs/symptoms of physical distress.    Intensity  THRR unchanged      Progression   Progression  Continue to progress workloads to maintain intensity without signs/symptoms of physical distress.    Average METs  2.27      Resistance Training   Training Prescription  Yes    Weight  4 lbs    Reps  10-15      Interval Training   Interval Training  No      Oxygen   Oxygen  Continuous    Liters  4      Treadmill   MPH  0.8    Grade  0    Minutes  15     METs  1.6      T5 Nustep   Level  3    SPM  72    Minutes  15    METs  2.2      Biostep-RELP   Level  6    SPM  40    Minutes  15    METs  3      Home Exercise Plan   Plans to continue exercise at  Home (comment) walking, chair exercises    Frequency  Add 2 additional days to program exercise sessions.    Initial Home Exercises Provided  02/25/18       Social History   Tobacco Use  Smoking Status Former Smoker  . Packs/day: 1.00  . Years: 35.00  . Pack years: 35.00  . Types: Cigarettes  . Last attempt to quit: 09/16/2006  . Years since quitting: 11.5  Smokeless Tobacco Never Used    Goals Met:  Independence with exercise equipment Exercise tolerated well No report of cardiac concerns or symptoms Strength training completed today  Goals Unmet:  Not Applicable  Comments: Pt able to follow exercise prescription today without complaint.  Will continue to monitor for progression.    Dr. Emily Filbert is Medical Director for [place] and [facility].

## 2018-03-20 ENCOUNTER — Encounter: Payer: Medicare HMO | Admitting: *Deleted

## 2018-03-20 DIAGNOSIS — Z9981 Dependence on supplemental oxygen: Secondary | ICD-10-CM | POA: Diagnosis not present

## 2018-03-20 DIAGNOSIS — I2510 Atherosclerotic heart disease of native coronary artery without angina pectoris: Secondary | ICD-10-CM | POA: Diagnosis not present

## 2018-03-20 DIAGNOSIS — E785 Hyperlipidemia, unspecified: Secondary | ICD-10-CM | POA: Diagnosis not present

## 2018-03-20 DIAGNOSIS — Z79899 Other long term (current) drug therapy: Secondary | ICD-10-CM | POA: Diagnosis not present

## 2018-03-20 DIAGNOSIS — M1990 Unspecified osteoarthritis, unspecified site: Secondary | ICD-10-CM | POA: Diagnosis not present

## 2018-03-20 DIAGNOSIS — Z7984 Long term (current) use of oral hypoglycemic drugs: Secondary | ICD-10-CM | POA: Diagnosis not present

## 2018-03-20 DIAGNOSIS — Z7982 Long term (current) use of aspirin: Secondary | ICD-10-CM | POA: Diagnosis not present

## 2018-03-20 DIAGNOSIS — J449 Chronic obstructive pulmonary disease, unspecified: Secondary | ICD-10-CM

## 2018-03-20 DIAGNOSIS — E119 Type 2 diabetes mellitus without complications: Secondary | ICD-10-CM | POA: Diagnosis not present

## 2018-03-20 DIAGNOSIS — I10 Essential (primary) hypertension: Secondary | ICD-10-CM | POA: Diagnosis not present

## 2018-03-20 NOTE — Progress Notes (Signed)
Daily Session Note  Patient Details  Name: Acxel Dingee MRN: [id number] Date of Birth: 1950-09-25 Referring Provider:     Pulmonary Rehab from 01/20/2018 in [facility] Cardiac and Pulmonary Rehab  Referring Provider  Christinia Gully MD      Encounter Date: 03/20/2018  Check In: Session Check In - 03/20/18 1126      Check-In   Location  [facility]iac [facility] Pulmonary Re[facility]    Staff Present  Justin Mend Lorre Nick, [place], RCEP, CCRP, Exercise Physiologist;Brooklin Rieger Sherryll Burger, RN BSN    Supervising physician immediately available to respond to emergencies  [facility] immediately available ER MD    Physician(s)  Dr. Cherylann Banas and Alfred Levins     Medication changes reported      No    Fall or balance concerns reported     No    Tobacco Cessation  No Change    Warm-up and Cool-down  Performed as group-led instruction    Resistance Training Performed  Yes    VAD Patient?  No      Pain Assessment   Currently in Pain?  No/denies          Social History   Tobacco Use  Smoking Status Former Smoker  . Packs/day: 1.00  . Years: 35.00  . Pack years: 35.00  . Types: Cigarettes  . Last attempt to quit: 09/16/2006  . Years since quitting: 11.5  Smokeless Tobacco Never Used    Goals Met:  Proper associated with RPD/PD & O2 Sat Independence with exercise equipment Using PLB without cueing & demonstrates good technique Exercise tolerated well No report of cardiac concerns or symptoms Strength training completed today  Goals Unmet:  Not Applicable  Comments: Pt able to follow exercise prescription today without complaint.  Will continue to monitor for progression.    Dr. Emily Filbert is Medical Director for [place] and [facility].

## 2018-03-22 DIAGNOSIS — R0602 Shortness of breath: Secondary | ICD-10-CM | POA: Diagnosis not present

## 2018-03-22 DIAGNOSIS — J449 Chronic obstructive pulmonary disease, unspecified: Secondary | ICD-10-CM | POA: Diagnosis not present

## 2018-03-23 DIAGNOSIS — Z79899 Other long term (current) drug therapy: Secondary | ICD-10-CM | POA: Diagnosis not present

## 2018-03-23 DIAGNOSIS — Z7984 Long term (current) use of oral hypoglycemic drugs: Secondary | ICD-10-CM | POA: Diagnosis not present

## 2018-03-23 DIAGNOSIS — Z9981 Dependence on supplemental oxygen: Secondary | ICD-10-CM | POA: Diagnosis not present

## 2018-03-23 DIAGNOSIS — M1990 Unspecified osteoarthritis, unspecified site: Secondary | ICD-10-CM | POA: Diagnosis not present

## 2018-03-23 DIAGNOSIS — E119 Type 2 diabetes mellitus without complications: Secondary | ICD-10-CM | POA: Diagnosis not present

## 2018-03-23 DIAGNOSIS — J449 Chronic obstructive pulmonary disease, unspecified: Secondary | ICD-10-CM | POA: Diagnosis not present

## 2018-03-23 DIAGNOSIS — I2510 Atherosclerotic heart disease of native coronary artery without angina pectoris: Secondary | ICD-10-CM | POA: Diagnosis not present

## 2018-03-23 DIAGNOSIS — Z7982 Long term (current) use of aspirin: Secondary | ICD-10-CM | POA: Diagnosis not present

## 2018-03-23 DIAGNOSIS — I10 Essential (primary) hypertension: Secondary | ICD-10-CM | POA: Diagnosis not present

## 2018-03-23 DIAGNOSIS — E785 Hyperlipidemia, unspecified: Secondary | ICD-10-CM | POA: Diagnosis not present

## 2018-03-23 NOTE — Progress Notes (Signed)
Daily Session Note  Patient Details  Name: Alfred Carney MRN: [id number] Date of Birth: [birth year] Referring Provider:     Pulmonary Rehab from 01/20/2018 in [facility] Cardiac and Pulmonary Rehab  Referring Provider  Christinia Gully MD      Encounter Date: 03/23/2018  Check In: Session Check In - 03/23/18 1138      Check-In   Location  [facility] Pulmonary Re[facility]    Staff Present  Justin Mend RCP,RRT,BSRT;Laureen Owens Shark, BS, RRT, Respiratory Bertis Ruddy, BS, ACSM CEP, Exercise Physiologist    Supervising physician immediately available to respond to emergencies  [facility] immediately available ER MD    Physician(s)  Dr. Wynona Neat and Cinda Quest    Medication changes reported      No    Fall or balance concerns reported     No    Tobacco Cessation  No Change    Warm-up and Cool-down  Performed as group-led instruction    Resistance Training Performed  Yes    VAD Patient?  No    PAD/SET Patient?  No      Pain Assessment   Currently in Pain?  No/denies    Multiple Pain Sites  No          Social History   Tobacco Use  Smoking Status Former Smoker  . Packs/day: 1.00  . Years: 35.00  . Pack years: 35.00  . Types: Cigarettes  . Last attempt to quit: 09/16/2006  . Years since quitting: 11.5  Smokeless Tobacco Never Used    Goals Met:  Proper associated with RPD/PD & O2 Sat Independence with exercise equipment Using PLB without cueing & demonstrates good technique Exercise tolerated well No report of cardiac concerns or symptoms Strength training completed today  Goals Unmet:  Not Applicable  Comments: Pt able to follow exercise prescription today without complaint.  Will continue to monitor for progression.    Dr. Emily Filbert is Medical Director for [place] and [facility].

## 2018-03-24 DIAGNOSIS — D509 Iron deficiency anemia, unspecified: Secondary | ICD-10-CM | POA: Diagnosis not present

## 2018-03-24 DIAGNOSIS — E1169 Type 2 diabetes mellitus with other specified complication: Secondary | ICD-10-CM | POA: Diagnosis not present

## 2018-03-24 DIAGNOSIS — I10 Essential (primary) hypertension: Secondary | ICD-10-CM | POA: Diagnosis not present

## 2018-03-24 DIAGNOSIS — E785 Hyperlipidemia, unspecified: Secondary | ICD-10-CM | POA: Diagnosis not present

## 2018-03-24 DIAGNOSIS — E1159 Type 2 diabetes mellitus with other circulatory complications: Secondary | ICD-10-CM | POA: Diagnosis not present

## 2018-03-25 DIAGNOSIS — I10 Essential (primary) hypertension: Secondary | ICD-10-CM | POA: Diagnosis not present

## 2018-03-25 DIAGNOSIS — Z7984 Long term (current) use of oral hypoglycemic drugs: Secondary | ICD-10-CM | POA: Diagnosis not present

## 2018-03-25 DIAGNOSIS — Z79899 Other long term (current) drug therapy: Secondary | ICD-10-CM | POA: Diagnosis not present

## 2018-03-25 DIAGNOSIS — E119 Type 2 diabetes mellitus without complications: Secondary | ICD-10-CM | POA: Diagnosis not present

## 2018-03-25 DIAGNOSIS — M1990 Unspecified osteoarthritis, unspecified site: Secondary | ICD-10-CM | POA: Diagnosis not present

## 2018-03-25 DIAGNOSIS — I2510 Atherosclerotic heart disease of native coronary artery without angina pectoris: Secondary | ICD-10-CM | POA: Diagnosis not present

## 2018-03-25 DIAGNOSIS — J449 Chronic obstructive pulmonary disease, unspecified: Secondary | ICD-10-CM | POA: Diagnosis not present

## 2018-03-25 DIAGNOSIS — Z9981 Dependence on supplemental oxygen: Secondary | ICD-10-CM | POA: Diagnosis not present

## 2018-03-25 DIAGNOSIS — Z7982 Long term (current) use of aspirin: Secondary | ICD-10-CM | POA: Diagnosis not present

## 2018-03-25 DIAGNOSIS — E785 Hyperlipidemia, unspecified: Secondary | ICD-10-CM | POA: Diagnosis not present

## 2018-03-25 NOTE — Progress Notes (Signed)
Daily Session Note  Patient Details  Name: Alfred Carney MRN: [id number] Date of Birth: 27-Feb-1951 Referring Provider:     Pulmonary Rehab from 01/20/2018 in [facility] Cardiac and Pulmonary Rehab  Referring Provider  Christinia Gully MD      Encounter Date: 03/25/2018  Check In: Session Check In - 03/25/18 1129      Check-In   Location  [facility]Cardiac [facility] Pulmonary Rehab    Staff Present  Justin Mend RCP,RRT,BSRT;Nana Addai, RN BSN;Meredith Sherryll Burger, RN BSN    Supervising physician immediately available to respond to emergencies  [facility] immediately available ER MD    Physician(s)  Dr. Archie Balboa and Joni Fears    Medication changes reported      No    Fall or balance concerns reported     No    Warm-up and Cool-down  Performed as group-led instruction    Resistance Training Performed  Yes    VAD Patient?  No      Pain Assessment   Currently in Pain?  No/denies          Social History   Tobacco Use  Smoking Status Former Smoker  . Packs/day: 1.00  . Years: 35.00  . Pack years: 35.00  . Types: Cigarettes  . Last attempt to quit: 09/16/2006  . Years since quitting: 11.5  Smokeless Tobacco Never Used    Goals Met:  Independence with exercise equipment Exercise tolerated well No report of cardiac concerns or symptoms Strength training completed today  Goals Unmet:  Not Applicable  Comments: Pt able to follow exercise prescription today without complaint.  Will continue to monitor for progression.   Dr. Emily Filbert is Medical Director for [place] and [facility].

## 2018-03-27 DIAGNOSIS — Z79899 Other long term (current) drug therapy: Secondary | ICD-10-CM | POA: Diagnosis not present

## 2018-03-27 DIAGNOSIS — I2510 Atherosclerotic heart disease of native coronary artery without angina pectoris: Secondary | ICD-10-CM | POA: Diagnosis not present

## 2018-03-27 DIAGNOSIS — Z9981 Dependence on supplemental oxygen: Secondary | ICD-10-CM | POA: Diagnosis not present

## 2018-03-27 DIAGNOSIS — Z7982 Long term (current) use of aspirin: Secondary | ICD-10-CM | POA: Diagnosis not present

## 2018-03-27 DIAGNOSIS — M1990 Unspecified osteoarthritis, unspecified site: Secondary | ICD-10-CM | POA: Diagnosis not present

## 2018-03-27 DIAGNOSIS — J449 Chronic obstructive pulmonary disease, unspecified: Secondary | ICD-10-CM

## 2018-03-27 DIAGNOSIS — Z7984 Long term (current) use of oral hypoglycemic drugs: Secondary | ICD-10-CM | POA: Diagnosis not present

## 2018-03-27 DIAGNOSIS — I10 Essential (primary) hypertension: Secondary | ICD-10-CM | POA: Diagnosis not present

## 2018-03-27 DIAGNOSIS — E119 Type 2 diabetes mellitus without complications: Secondary | ICD-10-CM | POA: Diagnosis not present

## 2018-03-27 DIAGNOSIS — E785 Hyperlipidemia, unspecified: Secondary | ICD-10-CM | POA: Diagnosis not present

## 2018-03-27 NOTE — Progress Notes (Signed)
Daily Session Note  Patient Details  Name: Alfred Carney MRN: [id number] Date of Birth: Sep 22, 1950 Referring Provider:     Pulmonary Rehab from 01/20/2018 in [facility] Cardiac and Pulmonary Rehab  Referring Provider  Christinia Gully MD      Encounter Date: 03/27/2018  Check In: Session Check In - 03/27/18 1142      Check-In   Location  [facility]Cardiac [facility] Pulmonary Re[facility]    Staff Present  Justin Mend RCP,RRT,BSRT;Meredith Sherryll Burger, RN Vickki Hearing, BA, ACSM CEP, Exercise Physiologist    Supervising physician immediately available to respond to emergencies  [facility] immediately available ER MD    Physician(s)  Dr. Joni Fears and Corky Downs    Medication changes reported      No    Fall or balance concerns reported     No    Tobacco Cessation  No Change    Warm-up and Cool-down  Performed as group-led instruction    Resistance Training Performed  Yes    VAD Patient?  No      Pain Assessment   Currently in Pain?  No/denies          Social History   Tobacco Use  Smoking Status Former Smoker  . Packs/day: 1.00  . Years: 35.00  . Pack years: 35.00  . Types: Cigarettes  . Last attempt to quit: 09/16/2006  . Years since quitting: 11.5  Smokeless Tobacco Never Used    Goals Met:  Proper associated with RPD/PD & O2 Sat Independence with exercise equipment Using PLB without cueing & demonstrates good technique Exercise tolerated well No report of cardiac concerns or symptoms Strength training completed today  Goals Unmet:  Not Applicable  Comments: Pt able to follow exercise prescription today without complaint.  Will continue to monitor for progression.    Dr. Emily Filbert is Medical Director for [place] and [facility].

## 2018-03-30 DIAGNOSIS — E119 Type 2 diabetes mellitus without complications: Secondary | ICD-10-CM | POA: Diagnosis not present

## 2018-03-30 DIAGNOSIS — I10 Essential (primary) hypertension: Secondary | ICD-10-CM | POA: Diagnosis not present

## 2018-03-30 DIAGNOSIS — Z79899 Other long term (current) drug therapy: Secondary | ICD-10-CM | POA: Diagnosis not present

## 2018-03-30 DIAGNOSIS — Z9981 Dependence on supplemental oxygen: Secondary | ICD-10-CM | POA: Diagnosis not present

## 2018-03-30 DIAGNOSIS — M1990 Unspecified osteoarthritis, unspecified site: Secondary | ICD-10-CM | POA: Diagnosis not present

## 2018-03-30 DIAGNOSIS — I2510 Atherosclerotic heart disease of native coronary artery without angina pectoris: Secondary | ICD-10-CM | POA: Diagnosis not present

## 2018-03-30 DIAGNOSIS — J449 Chronic obstructive pulmonary disease, unspecified: Secondary | ICD-10-CM | POA: Diagnosis not present

## 2018-03-30 DIAGNOSIS — Z7982 Long term (current) use of aspirin: Secondary | ICD-10-CM | POA: Diagnosis not present

## 2018-03-30 DIAGNOSIS — E785 Hyperlipidemia, unspecified: Secondary | ICD-10-CM | POA: Diagnosis not present

## 2018-03-30 DIAGNOSIS — Z7984 Long term (current) use of oral hypoglycemic drugs: Secondary | ICD-10-CM | POA: Diagnosis not present

## 2018-03-30 NOTE — Progress Notes (Signed)
Daily Session Note  Patient Details  Name: Alfred Carney MRN: [id number] Date of Birth: [birth year] Referring Provider:     Pulmonary Rehab from 01/20/2018 in [facility] Cardiac and Pulmonary Rehab  Referring Provider  Christinia Gully MD      Encounter Date: 03/30/2018  Check In: Session Check In - 03/30/18 1130      Check-In   Location  [facility] Pul[facility] Re[facility]    Staff Present  Justin Mend RCP,RRT,BSRT;Amanda Oletta Darter, BA, ACSM CEP, Exercise Physiologist;Kelly Amedeo Plenty, BS, ACSM CEP, Exercise Physiologist    Supervising physician immediately available to respond to emergencies  [facility] immediately available ER MD    Physician(s)  Dr. Jimmye Norman and Corky Downs    Medication changes reported      No    Fall or balance concerns reported     No    Warm-up and Cool-down  Performed as group-led instruction    Resistance Training Performed  Yes    VAD Patient?  No      Pain Assessment   Currently in Pain?  No/denies          Social History   Tobacco Use  Smoking Status Former Smoker  . Packs/day: 1.00  . Years: 35.00  . Pack years: 35.00  . Types: Cigarettes  . Last attempt to quit: 09/16/2006  . Years since quitting: 11.5  Smokeless Tobacco Never Used    Goals Met:  Independence with exercise equipment Exercise tolerated well No report of cardiac concerns or symptoms Strength training completed today  Goals Unmet:  Not Applicable  Comments: Pt able to follow exercise prescription today without complaint.  Will continue to monitor for progression.   Dr. Emily Filbert is Medical Director for [place] and [facility].

## 2018-04-03 ENCOUNTER — Encounter: Payer: Medicare HMO | Admitting: *Deleted

## 2018-04-03 DIAGNOSIS — I10 Essential (primary) hypertension: Secondary | ICD-10-CM | POA: Diagnosis not present

## 2018-04-03 DIAGNOSIS — Z79899 Other long term (current) drug therapy: Secondary | ICD-10-CM | POA: Diagnosis not present

## 2018-04-03 DIAGNOSIS — M1990 Unspecified osteoarthritis, unspecified site: Secondary | ICD-10-CM | POA: Diagnosis not present

## 2018-04-03 DIAGNOSIS — E119 Type 2 diabetes mellitus without complications: Secondary | ICD-10-CM | POA: Diagnosis not present

## 2018-04-03 DIAGNOSIS — Z7982 Long term (current) use of aspirin: Secondary | ICD-10-CM | POA: Diagnosis not present

## 2018-04-03 DIAGNOSIS — I2510 Atherosclerotic heart disease of native coronary artery without angina pectoris: Secondary | ICD-10-CM | POA: Diagnosis not present

## 2018-04-03 DIAGNOSIS — Z7984 Long term (current) use of oral hypoglycemic drugs: Secondary | ICD-10-CM | POA: Diagnosis not present

## 2018-04-03 DIAGNOSIS — J449 Chronic obstructive pulmonary disease, unspecified: Secondary | ICD-10-CM | POA: Diagnosis not present

## 2018-04-03 DIAGNOSIS — E785 Hyperlipidemia, unspecified: Secondary | ICD-10-CM | POA: Diagnosis not present

## 2018-04-03 DIAGNOSIS — Z9981 Dependence on supplemental oxygen: Secondary | ICD-10-CM | POA: Diagnosis not present

## 2018-04-03 NOTE — Progress Notes (Signed)
Daily Session Note  Patient Details  Name: Alfred Carney MRN: [id number] Date of Birth: 1950-11-29 Referring Provider:     Pulmonary Rehab from 01/20/2018 in [facility] Cardiac and Pulmonary Rehab  Referring Provider  Christinia Gully MD      Encounter Date: 04/03/2018  Check In: Session Check In - 04/03/18 1137      Check-In   Location  [facility] Re[facility]    Staff Present  Justin Mend Lorre Nick, [place], RCEP, CCRP, Exercise Physiologist;Dina Mobley Sherryll Burger, RN BSN    Supervising physician immediately available to respond to emergencies  [facility] immediately available ER MD    Physician(s)  Dr. Mariea Clonts and Burlene Arnt    Medication changes reported      No    Fall or balance concerns reported     No    Tobacco Cessation  No Change    Warm-up and Cool-down  Performed as group-led instruction    Resistance Training Performed  Yes    VAD Patient?  No      Pain Assessment   Currently in Pain?  No/denies          Social History   Tobacco Use  Smoking Status Former Smoker  . Packs/day: 1.00  . Years: 35.00  . Pack years: 35.00  . Types: Cigarettes  . Last attempt to quit: 09/16/2006  . Years since quitting: 11.5  Smokeless Tobacco Never Used    Goals Met:  Proper associated with RPD/PD & O2 Sat Independence with exercise equipment Using PLB without cueing & demonstrates good technique Exercise tolerated well No report of cardiac concerns or symptoms Strength training completed today  Goals Unmet:  Not Applicable  Comments: Pt able to follow exercise prescription today without complaint.  Will continue to monitor for progression.    Dr. Emily Filbert is Medical Director for [place] and [facility].

## 2018-04-06 DIAGNOSIS — Z7982 Long term (current) use of aspirin: Secondary | ICD-10-CM | POA: Diagnosis not present

## 2018-04-06 DIAGNOSIS — E785 Hyperlipidemia, unspecified: Secondary | ICD-10-CM | POA: Diagnosis not present

## 2018-04-06 DIAGNOSIS — Z9981 Dependence on supplemental oxygen: Secondary | ICD-10-CM | POA: Diagnosis not present

## 2018-04-06 DIAGNOSIS — I2510 Atherosclerotic heart disease of native coronary artery without angina pectoris: Secondary | ICD-10-CM | POA: Diagnosis not present

## 2018-04-06 DIAGNOSIS — J449 Chronic obstructive pulmonary disease, unspecified: Secondary | ICD-10-CM | POA: Diagnosis not present

## 2018-04-06 DIAGNOSIS — Z7984 Long term (current) use of oral hypoglycemic drugs: Secondary | ICD-10-CM | POA: Diagnosis not present

## 2018-04-06 DIAGNOSIS — Z79899 Other long term (current) drug therapy: Secondary | ICD-10-CM | POA: Diagnosis not present

## 2018-04-06 DIAGNOSIS — E119 Type 2 diabetes mellitus without complications: Secondary | ICD-10-CM | POA: Diagnosis not present

## 2018-04-06 DIAGNOSIS — I10 Essential (primary) hypertension: Secondary | ICD-10-CM | POA: Diagnosis not present

## 2018-04-06 DIAGNOSIS — M1990 Unspecified osteoarthritis, unspecified site: Secondary | ICD-10-CM | POA: Diagnosis not present

## 2018-04-06 NOTE — Progress Notes (Signed)
Daily Session Note  Patient Details  Name: Alfred Carney MRN: [id number] Date of Birth: 05-02-1951 Referring Provider:     Pulmonary Rehab from 01/20/2018 in [facility] Cardiac and Pulmonary Rehab  Referring Provider  Alfred Gully MD      Encounter Date: 04/06/2018  Check In: Session Check In - 04/06/18 1140      Check-In   Location  [facility]Cardiac [facility] Pulmonary Re[facility]    Staff Present  Alfred Carney, BS, ACSM CEP, Exercise Physiologist;Alfred Carney, [place], ACSM CEP, Exercise Physiologist    Supervising physician immediately available to respond to emergencies  [facility] immediately available ER MD    Physician(s)  Alfred Carney and Alfred Carney    Medication changes reported      No    Fall or balance concerns reported     No    Warm-up and Cool-down  Performed as group-led instruction    Resistance Training Performed  Yes    VAD Patient?  No    PAD/SET Patient?  No      Pain Assessment   Currently in Pain?  No/denies    Multiple Pain Sites  No          Social History   Tobacco Use  Smoking Status Former Smoker  . Packs/day: 1.00  . Years: 35.00  . Pack years: 35.00  . Types: Cigarettes  . Last attempt to quit: 09/16/2006  . Years since quitting: 11.5  Smokeless Tobacco Never Used    Goals Met:  Proper associated with RPD/PD & O2 Sat Independence with exercise equipment Exercise tolerated well Strength training completed today  Goals Unmet:  Not Applicable  Comments: Pt able to follow exercise prescription today without complaint.  Will continue to monitor for progression.    Dr. Emily Carney is Medical Director for [name] and [facility].

## 2018-04-08 ENCOUNTER — Encounter: Payer: Medicare HMO | Admitting: *Deleted

## 2018-04-08 DIAGNOSIS — E119 Type 2 diabetes mellitus without complications: Secondary | ICD-10-CM | POA: Diagnosis not present

## 2018-04-08 DIAGNOSIS — J449 Chronic obstructive pulmonary disease, unspecified: Secondary | ICD-10-CM | POA: Diagnosis not present

## 2018-04-08 DIAGNOSIS — Z7982 Long term (current) use of aspirin: Secondary | ICD-10-CM | POA: Diagnosis not present

## 2018-04-08 DIAGNOSIS — I10 Essential (primary) hypertension: Secondary | ICD-10-CM | POA: Diagnosis not present

## 2018-04-08 DIAGNOSIS — M1990 Unspecified osteoarthritis, unspecified site: Secondary | ICD-10-CM | POA: Diagnosis not present

## 2018-04-08 DIAGNOSIS — E785 Hyperlipidemia, unspecified: Secondary | ICD-10-CM | POA: Diagnosis not present

## 2018-04-08 DIAGNOSIS — Z7984 Long term (current) use of oral hypoglycemic drugs: Secondary | ICD-10-CM | POA: Diagnosis not present

## 2018-04-08 DIAGNOSIS — Z79899 Other long term (current) drug therapy: Secondary | ICD-10-CM | POA: Diagnosis not present

## 2018-04-08 DIAGNOSIS — Z9981 Dependence on supplemental oxygen: Secondary | ICD-10-CM | POA: Diagnosis not present

## 2018-04-08 DIAGNOSIS — I2510 Atherosclerotic heart disease of native coronary artery without angina pectoris: Secondary | ICD-10-CM | POA: Diagnosis not present

## 2018-04-08 LAB — GLUCOSE, CAPILLARY: Glucose-Capillary: 113 mg/dL — ABNORMAL HIGH (ref 70–99)

## 2018-04-08 NOTE — Progress Notes (Signed)
Daily Session Note  Patient Details  Name: Alfred Carney MRN: [id number] Date of Birth: 22-Aug-1951 Referring Provider:     Pulmonary Rehab from 01/20/2018 in [facility] Cardiac and Pulmonary Rehab  Referring Provider  Christinia Gully MD      Encounter Date: 04/08/2018  Check In: Session Check In - 04/08/18 1159      Check-In   Location  [facility]Card[facility]monary Re[facility]    Staff Present  Justin Mend Lorre Nick, [place], RCEP, CCRP, Exercise Physiologist;Meredith Sherryll Burger, RN BSN    Supervising physician immediately available to respond to emergencies  [facility] immediately available ER MD    Physician(s)  Dr. Burlene Arnt and [facility]    Medication changes reported      No    Fall or balance concerns reported     No    Tobacco Cessation  No Change    Warm-up and Cool-down  Performed as group-led instruction    Resistance Training Performed  Yes    VAD Patient?  No      Pain Assessment   Currently in Pain?  No/denies          Social History   Tobacco Use  Smoking Status Former Smoker  . Packs/day: 1.00  . Years: 35.00  . Pack years: 35.00  . Types: Cigarettes  . Last attempt to quit: 09/16/2006  . Years since quitting: 11.5  Smokeless Tobacco Never Used    Goals Met:  Proper associated with RPD/PD & O2 Sat Independence with exercise equipment Using PLB without cueing & demonstrates good technique Exercise tolerated well No report of cardiac concerns or symptoms Strength training completed today  Goals Unmet:  Not Applicable  Comments: Pt able to follow exercise prescription today without complaint.  Will continue to monitor for progression.    Dr. Emily Filbert is Medical Director for [place] and [facility].

## 2018-04-13 DIAGNOSIS — I2510 Atherosclerotic heart disease of native coronary artery without angina pectoris: Secondary | ICD-10-CM | POA: Diagnosis not present

## 2018-04-13 DIAGNOSIS — E785 Hyperlipidemia, unspecified: Secondary | ICD-10-CM | POA: Diagnosis not present

## 2018-04-13 DIAGNOSIS — M1990 Unspecified osteoarthritis, unspecified site: Secondary | ICD-10-CM | POA: Diagnosis not present

## 2018-04-13 DIAGNOSIS — Z9981 Dependence on supplemental oxygen: Secondary | ICD-10-CM | POA: Diagnosis not present

## 2018-04-13 DIAGNOSIS — J449 Chronic obstructive pulmonary disease, unspecified: Secondary | ICD-10-CM | POA: Diagnosis not present

## 2018-04-13 DIAGNOSIS — Z7982 Long term (current) use of aspirin: Secondary | ICD-10-CM | POA: Diagnosis not present

## 2018-04-13 DIAGNOSIS — E119 Type 2 diabetes mellitus without complications: Secondary | ICD-10-CM | POA: Diagnosis not present

## 2018-04-13 DIAGNOSIS — Z7984 Long term (current) use of oral hypoglycemic drugs: Secondary | ICD-10-CM | POA: Diagnosis not present

## 2018-04-13 DIAGNOSIS — I10 Essential (primary) hypertension: Secondary | ICD-10-CM | POA: Diagnosis not present

## 2018-04-13 DIAGNOSIS — Z79899 Other long term (current) drug therapy: Secondary | ICD-10-CM | POA: Diagnosis not present

## 2018-04-13 NOTE — Progress Notes (Signed)
Daily Session Note  Patient Details  Name: Alfred Carney MRN: [id number] Date of Birth: 1950/10/28 Referring Provider:     Pulmonary Rehab from 01/20/2018 in [place] Cardiac and Pulmonary Rehab  Referring Provider  Christinia Gully MD      Encounter Date: 04/13/2018  Check In: Session Check In - 04/13/18 1158      Check-In   Supervising physician immediately available to respond to emergencies  [facility] immediately available ER MD    Physician(s)  Dr. Quentin Cornwall and Clearnce Hasten    Location  [facility]Cardiac [facility] Pulmonary Rehab    Staff Present  Justin Mend RCP,RRT,BSRT;Amanda Oletta Darter, BA, ACSM CEP, Exercise Physiologist;Kelly Amedeo Plenty, BS, ACSM CEP, Exercise Physiologist    Medication changes reported      No    Fall or balance concerns reported     No    Warm-up and Cool-down  Performed as group-led instruction    Resistance Training Performed  Yes    VAD Patient?  No      Pain Assessment   Currently in Pain?  No/denies          Social History   Tobacco Use  Smoking Status Former Smoker  . Packs/day: 1.00  . Years: 35.00  . Pack years: 35.00  . Types: Cigarettes  . Last attempt to quit: 09/16/2006  . Years since quitting: 11.5  Smokeless Tobacco Never Used    Goals Met:  Independence with exercise equipment Exercise tolerated well No report of cardiac concerns or symptoms Strength training completed today  Goals Unmet:  Not Applicable  Comments: Pt able to follow exercise prescription today without complaint.  Will continue to monitor for progression.   Dr. Emily Filbert is Medical Director for [place] and [facility].

## 2018-04-13 NOTE — Progress Notes (Signed)
Pulmonary Individual Treatment Plan  Patient Details  Name: Alfred Carney MRN: [id number] Date of Birth: 07-17-1951 Referring Provider:     Pulmonary Rehab from 01/20/2018 in [facility] Cardiac and Pulmonary Rehab  Referring Provider  Christinia Gully MD      Initial Encounter Date:    Pulmonary Rehab from 01/20/2018 in [facility] Cardiac and Pulmonary Rehab  Date  01/20/18      Visit Diagnosis: Chronic obstructive pulmonary disease, unspecified COPD type ([place])  Patient's Home Medications on Admission:  Current Outpatient Medications:  .  albuterol (PROVENTIL HFA;VENTOLIN HFA) 108 (90 Base) MCG/ACT inhaler, Inhale 2 puffs into the lungs every 6 (six) hours as needed for wheezing or shortness of breath., Disp: 1 Inhaler, Rfl: 6 .  amLODipine (NORVASC) 5 MG tablet, Take 5 mg by mouth daily. , Disp: , Rfl:  .  Calcium Carbonate-Simethicone (ALKA-SELTZER HEARTBURN + GAS) 750-80 MG CHEW, Chew by mouth as needed., Disp: , Rfl:  .  Cholecalciferol (VITAMIN D PO), Take 1 capsule by mouth 2 (two) times daily., Disp: , Rfl:  .  ferrous sulfate 325 (65 FE) MG tablet, Take 325 mg by mouth daily with breakfast. , Disp: , Rfl:  .  furosemide (LASIX) 20 MG tablet, Take 1 tablet (20 mg total) by mouth daily as needed. (Patient taking differently: Take 20 mg by mouth daily. ), Disp: 30 tablet, Rfl: 11 .  irbesartan (AVAPRO) 300 MG tablet, Take 300 mg by mouth daily. , Disp: , Rfl:  .  ketoconazole (NIZORAL) 2 % shampoo, Apply 1 application topically every other day. , Disp: , Rfl:  .  metFORMIN (GLUCOPHAGE) 500 MG tablet, Take 500 mg by mouth 2 (two) times daily with a meal. , Disp: , Rfl:  .  metoprolol succinate (TOPROL-XL) 25 MG 24 hr tablet, Take 25 mg by mouth daily., Disp: , Rfl:  .  nitroGLYCERIN (NITROSTAT) 0.4 MG SL tablet, Place 1 tablet (0.4 mg total) under the tongue every 5 (five) minutes x 3 doses as needed for chest pain., Disp: 25 tablet, Rfl: 4 .  omeprazole (PRILOSEC) 40 MG capsule, Take 1 capsule (40 mg  total) by mouth daily., Disp: 90 capsule, Rfl: 3 .  OXYGEN, Inhale 2 L into the lungs continuous. , Disp: , Rfl:  .  rosuvastatin (CRESTOR) 10 MG tablet, Take 5 mg by mouth daily., Disp: , Rfl:  .  tamsulosin (FLOMAX) 0.4 MG CAPS capsule, Take 0.4 mg by mouth daily. , Disp: , Rfl:   Past Medical History: Past Medical History:  Diagnosis Date  . Chronic obstructive pulmonary disease ([place]) 2008   Moderate  . Colon polyps   . Community acquired pneumonia   . Coronary artery disease   . Degenerative joint disease   . Diabetes mellitus without complication ([place])    type 2  . Emphysema lung ([place])   . Enlarged liver    fatty liver by '09 CT  . Gastroesophageal reflux disease   . Gout   . H/O hiatal hernia   . History of [place] spotted fever    Possible history of [place] Spotted Fever  . Hyperlipidemia   . Hypertension   . Hypokalemia    diuretic induced, resolved  . Microcytic anemia    iron pills  . Morbid obesity ([place])   . Shortness of breath   . Skin cancer    skin cancer lip removed  . Sleep apnea    not currently using CPAP 05/20/13  . 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 Packs/day: 1.00  . Years: 35.00  . Pack years: 35.00  . Types: Cigarettes  . Last attempt to quit: 09/16/2006  . Years since quitting: 11.5  Smokeless Tobacco Never Used    Labs: Recent Chemical engineer    Labs for ITP Cardiac and Pulmonary Rehab Latest Ref Rng & Units 10/15/2011 01/14/2012 01/29/2014 05/08/2014   Cholestrol 0 - 200 mg/dL 167 82 88 -   LDLCALC 0 - 99 mg/dL 104(H) 25 32 -   HDL >39 mg/dL 41.00 36.40(L) 30(L) -   Trlycerides <150 mg/dL 112.0 102.0 130 -   Hemoglobin A1c <5.7 % - - - 6.6(H)       Pulmonary Assessment Scores: Pulmonary Assessment Scores    Row Name 01/20/18 1502         ADL UCSD   ADL Phase  Entry     SOB Score total  88       Rest  1     Walk  4     Stairs  4     Bath  5     Dress  3     Shop  5       CAT Score   CAT Score  23       mMRC Score   mMRC Score  4        Pulmonary Function Assessment: Pulmonary Function Assessment - 01/20/18 1439      Pulmonary Function Tests   FVC%  64 %    FEV1%  52 %      Breath   Bilateral Breath Sounds  Clear;Decreased    Shortness of Breath  Yes;Limiting activity       Exercise Target Goals:    Exercise Program Goal: Individual exercise prescription set using results from initial 6 min walk test and THRR while considering  patient's activity barriers and safety.    Exercise Prescription Goal: Initial exercise prescription builds to 30-45 minutes a day of aerobic activity, 2-3 days per week.  Home exercise guidelines will be given to patient during program as part of exercise prescription that the participant will acknowledge.  Activity Barriers & Risk Stratification: Activity Barriers & Cardiac Risk Stratification - 01/20/18 1531      Activity Barriers & Cardiac Risk Stratification   Activity Barriers  Deconditioning;Muscular Weakness;Balance Concerns;Shortness of Breath;History of Falls;Assistive Device uses cane for balance       6 Minute Walk: 6 Minute Walk    Row Name 01/20/18 1526         6 Minute Walk   Phase  Initial     Distance  142 feet     Walk Time  1.05 minutes test terminated due to desaturation     # of Rest Breaks  0     MPH  1.54     METS  0.09     RPE  13     Perceived Dyspnea   2     VO2 Peak  0.32     Symptoms  Yes (comment)     Comments  SOB, uses cane for balance     Resting HR  70 bpm     Resting BP  136/64     Resting Oxygen Saturation   90 %     Exercise Oxygen Saturation  during 6 min walk  73 %  Max Ex. HR  100 bpm     Max Ex. BP  168/74     2 Minute Post BP  156/70 140/74       Interval HR   1 Minute HR  100     2 Minute HR  99 test stopped at 1:03     3 Minute HR  91     4 Minute HR  77     6  Minute HR  68     Interval Heart Rate?  Yes       Interval Oxygen   Interval Oxygen?  Yes     Baseline Oxygen Saturation %  90 %     1 Minute Oxygen Saturation %  80 % test terminated at 1:03 73%     1 Minute Liters of Oxygen  2 L continuous     2 Minute Oxygen Saturation %  87 %     2 Minute Liters of Oxygen  2 L     4 Minute Oxygen Saturation %  95 %     4 Minute Liters of Oxygen  2 L     6 Minute Oxygen Saturation %  94 %     6 Minute Liters of Oxygen  2 L       Oxygen Initial Assessment: Oxygen Initial Assessment - 01/20/18 1441      Home Oxygen   Home Oxygen Device  Home Concentrator;E-Tanks    Sleep Oxygen Prescription  CPAP;Continuous    Liters per minute  2    Home Exercise Oxygen Prescription  Continuous    Liters per minute  2    Home at Rest Exercise Oxygen Prescription  Continuous    Liters per minute  2    Compliance with Home Oxygen Use  No does not wear CPAP      Initial 6 min Walk   Oxygen Used  Continuous    Liters per minute  2      Program Oxygen Prescription   Program Oxygen Prescription  Continuous    Liters per minute  2      Intervention   Short Term Goals  To learn and understand importance of monitoring SPO2 with pulse oximeter and demonstrate accurate use of the pulse oximeter.;To learn and demonstrate proper pursed lip breathing techniques or other breathing techniques.;To learn and demonstrate proper use of respiratory medications;To learn and understand importance of maintaining oxygen saturations>88%;To learn and exhibit compliance with exercise, home and travel O2 prescription    Long  Term Goals  Exhibits compliance with exercise, home and travel O2 prescription;Verbalizes importance of monitoring SPO2 with pulse oximeter and return demonstration;Maintenance of O2 saturations>88%;Exhibits proper breathing techniques, such as pursed lip breathing or other method taught during program session;Compliance with respiratory medication;Demonstrates  proper use of MDI's       Oxygen Re-Evaluation: Oxygen Re-Evaluation    Row Name 02/27/18 1149 04/03/18 1241           Program Oxygen Prescription   Program Oxygen Prescription  Continuous  Continuous      Liters per minute  4  4      Comments  4 liters for exercise  4 liters for exercise        Home Oxygen   Home Oxygen Device  Home Concentrator;E-Tanks  Home Concentrator;E-Tanks      Sleep Oxygen Prescription  CPAP;Continuous  CPAP;Continuous      Liters per minute  2  2  Home Exercise Oxygen Prescription  Continuous  Continuous      Liters per minute  2  2      Home at Rest Exercise Oxygen Prescription  Continuous  Continuous      Liters per minute  2  2      Compliance with Home Oxygen Use  No he states he cannot tolerate that.  No        Goals/Expected Outcomes   Short Term Goals  To learn and understand importance of monitoring SPO2 with pulse oximeter and demonstrate accurate use of the pulse oximeter.;To learn and demonstrate proper pursed lip breathing techniques or other breathing techniques.;To learn and demonstrate proper use of respiratory medications;To learn and understand importance of maintaining oxygen saturations>88%;To learn and exhibit compliance with exercise, home and travel O2 prescription  To learn and understand importance of monitoring SPO2 with pulse oximeter and demonstrate accurate use of the pulse oximeter.;To learn and demonstrate proper pursed lip breathing techniques or other breathing techniques.;To learn and demonstrate proper use of respiratory medications;To learn and understand importance of maintaining oxygen saturations>88%;To learn and exhibit compliance with exercise, home and travel O2 prescription      Long  Term Goals  Exhibits compliance with exercise, home and travel O2 prescription;Verbalizes importance of monitoring SPO2 with pulse oximeter and return demonstration;Maintenance of O2 saturations>88%;Exhibits proper breathing  techniques, such as pursed lip breathing or other method taught during program session;Compliance with respiratory medication;Demonstrates proper use of MDI's  Exhibits compliance with exercise, home and travel O2 prescription;Verbalizes importance of monitoring SPO2 with pulse oximeter and return demonstration;Maintenance of O2 saturations>88%;Exhibits proper breathing techniques, such as pursed lip breathing or other method taught during program session;Compliance with respiratory medication;Demonstrates proper use of MDI's      Comments  Kaius does not take nebulizers and has a albuterol inhaler if needed. He needs to work on PLB at home a little more. He checks his oxygen often at home and tries to keep it above 88 percent.  Darvis really wants to work on coming off of his oxygen. He does not use it in the car and sats have maintained above 90%.  He is going to start trying to walk down to rehab and back to car afterwards.  He is going to start with 2L.      Goals/Expected Outcomes  Short: work on PLB while on the treadmill. Long: Be independent with PLB  Short: Continue to use PLB and walk into rehab.  Long: Continue to work on coming off oxygen.          Oxygen Discharge (Final Oxygen Re-Evaluation): Oxygen Re-Evaluation - 04/03/18 1241      Program Oxygen Prescription   Program Oxygen Prescription  Continuous    Liters per minute  4    Comments  4 liters for exercise      Home Oxygen   Home Oxygen Device  Home Concentrator;E-Tanks    Sleep Oxygen Prescription  CPAP;Continuous    Liters per minute  2    Home Exercise Oxygen Prescription  Continuous    Liters per minute  2    Home at Rest Exercise Oxygen Prescription  Continuous    Liters per minute  2    Compliance with Home Oxygen Use  No      Goals/Expected Outcomes   Short Term Goals  To learn and understand importance of monitoring SPO2 with pulse oximeter and demonstrate accurate use of the pulse oximeter.;To learn and demonstrate  proper  pursed lip breathing techniques or other breathing techniques.;To learn and demonstrate proper use of respiratory medications;To learn and understand importance of maintaining oxygen saturations>88%;To learn and exhibit compliance with exercise, home and travel O2 prescription    Long  Term Goals  Exhibits compliance with exercise, home and travel O2 prescription;Verbalizes importance of monitoring SPO2 with pulse oximeter and return demonstration;Maintenance of O2 saturations>88%;Exhibits proper breathing techniques, such as pursed lip breathing or other method taught during program session;Compliance with respiratory medication;Demonstrates proper use of MDI's    Comments  Cordney really wants to work on coming off of his oxygen. He does not use it in the car and sats have maintained above 90%.  He is going to start trying to walk down to rehab and back to car afterwards.  He is going to start with 2L.    Goals/Expected Outcomes  Short: Continue to use PLB and walk into rehab.  Long: Continue to work on coming off oxygen.        Initial Exercise Prescription: Initial Exercise Prescription - 01/20/18 1500      Date of Initial Exercise RX and Referring Provider   Date  01/20/18    Referring Provider  Christinia Gully MD      Oxygen   Oxygen  Continuous    Liters  4      Treadmill   MPH  0.8    Grade  0    Minutes  15    METs  1.6      T5 Nustep   Level  1    SPM  80    Minutes  15    METs  1.5      Biostep-RELP   Level  1    SPM  40    Minutes  15    METs  2      Prescription Details   Frequency (times per week)  3    Duration  Progress to 45 minutes of aerobic exercise without signs/symptoms of physical distress      Intensity   THRR 40-80% of Max Heartrate  103-136    Ratings of Perceived Exertion  11-13    Perceived Dyspnea  0-4      Progression   Progression  Continue to progress workloads to maintain intensity without signs/symptoms of physical distress.       Resistance Training   Training Prescription  Yes    Weight  3 lbs    Reps  10-15       Perform Capillary Blood Glucose checks as needed.  Exercise Prescription Changes: Exercise Prescription Changes    Row Name 01/20/18 1500 02/03/18 1400 02/19/18 1400 02/25/18 1200 03/03/18 1400     Response to Exercise   Blood Pressure (Admit)  136/64  134/84  124/62  -  140/80   Blood Pressure (Exercise)  168/74  160/76  -  -  -   Blood Pressure (Exit)  140/74  124/74  102/62  -  128/68   Heart Rate (Admit)  70 bpm  67 bpm  65 bpm  -  62 bpm   Heart Rate (Exercise)  100 bpm  112 bpm  94 bpm  -  97 bpm   Heart Rate (Exit)  68 bpm  70 bpm  78 bpm  -  82 bpm   Oxygen Saturation (Admit)  90 %  95 %  93 %  -  95 %   Oxygen Saturation (Exercise)  73 %  88 %  90 %  -  89 %   Oxygen Saturation (Exit)  94 %  94 %  94 %  -  93 %   Rating of Perceived Exertion (Exercise)  _0 -  15   Perceived Dyspnea (Exercise)  _1 -  3   Symptoms  SOB  SOB  SOB  -  SOB on treadmill   Comments  walk test results, uses cane  second full day of exercise  -  -  -   Duration  -  Progress to 45 minutes of aerobic exercise without signs/symptoms of physical distress  Continue with 45 min of aerobic exercise without signs/symptoms of physical distress.  -  Continue with 45 min of aerobic exercise without signs/symptoms of physical distress.   Intensity  -  THRR unchanged  THRR unchanged  -  THRR unchanged     Progression   Progression  -  Continue to progress workloads to maintain intensity without signs/symptoms of physical distress.  Continue to progress workloads to maintain intensity without signs/symptoms of physical distress.  -  Continue to progress workloads to maintain intensity without signs/symptoms of physical distress.   Average METs  -  1.83  1.8  -  2.23     Resistance Training   Training Prescription  -  Yes  Yes  -  Yes   Weight  -  3 lbs  3 lb  -  4 lbs   Reps  -  10-15  10-15  -  10-15      Interval Training   Interval Training  -  No  No  -  No     Oxygen   Oxygen  -  Continuous  Continuous  -  Continuous   Liters  -  4  4  -  4     Treadmill   MPH  -  0.8  0.8  -  0.8   Grade  -  0  0  -  0   Minutes  -  6 4 min, 2 min  6 6/3/5  -  15   METs  -  1.6  1.6  -  1.6     T5 Nustep   Level  -  1  -  -  3   SPM  -  78  -  -  76   Minutes  -  15  -  -  15   METs  -  1.9  -  -  2.1     Biostep-RELP   Level  -  1  2  -  4   SPM  -  44  42  -  44   Minutes  -  15  15  -  15   METs  -  2  2  -  3     Home Exercise Plan   Plans to continue exercise at  -  -  -  Home (comment) walking, chair exercises  Home (comment) walking, chair exercises   Frequency  -  -  -  Add 1 additional day to program exercise sessions.  Add 1 additional day to program exercise sessions.   Initial Home Exercises Provided  -  -  -  02/25/18  02/25/18   Row Name 03/17/18 1500 03/31/18 1500           Response to Exercise   Blood Pressure (Admit)  128/70  128/70      Blood Pressure (Exit)  132/70  124/74      Heart Rate (Admit)  64 bpm  79 bpm      Heart Rate (Exercise)  89 bpm  90 bpm      Heart Rate (Exit)  74 bpm  84 bpm      Oxygen Saturation (Admit)  96 %  95 %      Oxygen Saturation (Exercise)  88 %  88 %      Oxygen Saturation (Exit)  95 %  95 %      Rating of Perceived Exertion (Exercise)  14  13      Perceived Dyspnea (Exercise)  3  2      Symptoms  SOB on treadmill  SOB on treadmill      Duration  Continue with 45 min of aerobic exercise without signs/symptoms of physical distress.  Continue with 45 min of aerobic exercise without signs/symptoms of physical distress.      Intensity  THRR unchanged  THRR unchanged        Progression   Progression  Continue to progress workloads to maintain intensity without signs/symptoms of physical distress.  Continue to progress workloads to maintain intensity without signs/symptoms of physical distress.      Average METs  2.27  2.3         Resistance Training   Training Prescription  Yes  Yes      Weight  4 lbs  4 lbs      Reps  10-15  10-15        Interval Training   Interval Training  No  No        Oxygen   Oxygen  Continuous  Continuous      Liters  4  4        Treadmill   MPH  0.8  0.8      Grade  0  0      Minutes  15  15      METs  1.6  1.6        T5 Nustep   Level  3  4      SPM  72  -      Minutes  15  15      METs  2.2  2.3        Biostep-RELP   Level  6  7      SPM  40  -      Minutes  15  15      METs  3  3        Home Exercise Plan   Plans to continue exercise at  Home (comment) walking, chair exercises  Home (comment) walking, chair exercises      Frequency  Add 2 additional days to program exercise sessions.  Add 2 additional days to program exercise sessions.      Initial Home Exercises Provided  02/25/18  02/25/18         Exercise Comments: Exercise Comments    Row Name 01/26/18 1146           Exercise Comments  First full day of exercise!  Patient was oriented to gym and equipment including functions, settings, policies, and procedures.  Patient's individual exercise prescription and treatment plan were reviewed.  All starting workloads were established based on the results of the 6 minute walk test done at initial orientation visit.  The plan  for exercise progression was also introduced and progression will be customized based on patient's performance and goals.          Exercise Goals and Review: Exercise Goals    Row Name 01/20/18 1539             Exercise Goals   Increase Physical Activity  Yes       Intervention  Provide advice, education, support and counseling about physical activity/exercise needs.;Develop an individualized exercise prescription for aerobic and resistive training based on initial evaluation findings, risk stratification, comorbidities and participant's personal goals.       Expected Outcomes  Short Term: Attend rehab on a regular basis to increase  amount of physical activity.;Long Term: Add in home exercise to make exercise part of routine and to increase amount of physical activity.;Long Term: Exercising regularly at least 3-5 days a week.       Increase Strength and Stamina  Yes       Intervention  Provide advice, education, support and counseling about physical activity/exercise needs.;Develop an individualized exercise prescription for aerobic and resistive training based on initial evaluation findings, risk stratification, comorbidities and participant's personal goals.       Expected Outcomes  Short Term: Increase workloads from initial exercise prescription for resistance, speed, and METs.;Short Term: Perform resistance training exercises routinely during rehab and add in resistance training at home;Long Term: Improve cardiorespiratory fitness, muscular endurance and strength as measured by increased METs and functional capacity (6MWT)       Able to understand and use rate of perceived exertion (RPE) scale  Yes       Intervention  Provide education and explanation on how to use RPE scale       Expected Outcomes  Short Term: Able to use RPE daily in rehab to express subjective intensity level;Long Term:  Able to use RPE to guide intensity level when exercising independently       Able to understand and use Dyspnea scale  Yes       Intervention  Provide education and explanation on how to use Dyspnea scale       Expected Outcomes  Short Term: Able to use Dyspnea scale daily in rehab to express subjective sense of shortness of breath during exertion;Long Term: Able to use Dyspnea scale to guide intensity level when exercising independently       Knowledge and understanding of Target Heart Rate Range (THRR)  Yes       Intervention  Provide education and explanation of THRR including how the numbers were predicted and where they are located for reference       Expected Outcomes  Short Term: Able to state/look up THRR;Short Term: Able to use  daily as guideline for intensity in rehab;Long Term: Able to use THRR to govern intensity when exercising independently       Able to check pulse independently  Yes       Intervention  Provide education and demonstration on how to check pulse in carotid and radial arteries.;Review the importance of being able to check your own pulse for safety during independent exercise       Expected Outcomes  Short Term: Able to explain why pulse checking is important during independent exercise;Long Term: Able to check pulse independently and accurately       Understanding of Exercise Prescription  Yes       Intervention  Provide education, explanation, and written materials on patient's individual exercise prescription  Expected Outcomes  Short Term: Able to explain program exercise prescription;Long Term: Able to explain home exercise prescription to exercise independently          Exercise Goals Re-Evaluation : Exercise Goals Re-Evaluation    Row Name 01/26/18 1146 02/03/18 1439 02/19/18 1434 02/25/18 1217 03/03/18 1447     Exercise Goal Re-Evaluation   Exercise Goals Review  Increase Physical Activity;Able to understand and use Dyspnea scale;Increase Strength and Stamina;Knowledge and understanding of Target Heart Rate Range (THRR);Able to understand and use rate of perceived exertion (RPE) scale  Increase Physical Activity;Understanding of Exercise Prescription;Increase Strength and Stamina  Increase Physical Activity;Able to understand and use rate of perceived exertion (RPE) scale;Able to understand and use Dyspnea scale  Increase Physical Activity;Able to understand and use rate of perceived exertion (RPE) scale;Able to understand and use Dyspnea scale;Knowledge and understanding of Target Heart Rate Range (THRR);Understanding of Exercise Prescription;Increase Strength and Stamina;Able to check pulse independently  Increase Physical Activity;Understanding of Exercise Prescription;Increase Strength and  Stamina   Comments  Reviewed RPE scale, THR and program prescription with pt today.  Pt voiced understanding and was given a copy of goals to take home.   Delray is off to a good start in rehab.  He has only attend two full days of exercise due to other medical appointments he had scheduled. He will do well if he is able to come consistently to class each day.  We will continue to monitor his progress.   Pt has increased total walk without break from 4 to 6 minutes.  Staff will monitor progress.  Reviewed home exercise with pt today.  Pt plans to walk and do chair exercises at home for exercise.  He will start with one extra day a week.  Reviewed THR, pulse, RPE, sign and symptoms, NTG use, and when to call 911 or MD.  Also discussed weather considerations and indoor options.  Pt voiced understanding.  Amelio has been doing well in rehab.  He is now up to 4 lbs weights.  He is only averaging about two days a week and would benefit from coming all three days consistently.  He has also increased workloads on the BioStep and T5 NuStep.  We will continue to monitor his progression.    Expected Outcomes  Short: Use RPE daily to regulate intensity.  Long: Follow program prescription in [facility]R.  Short: Attend rehab regularly.  Long: Continue to follow program prescription.   Short - pt will walk 8-10 min without stopping Long - Pt will walk 15 min without stopping  Short: Add in at least one extra day a week at home.  Long: Continue to exercise independently.   Short: Attend class regularly.  Long: Continue to exercise more at home.    [place] Name 03/17/18 1522 03/31/18 1502 04/03/18 1234         Exercise Goal Re-Evaluation   Exercise Goals Review  Increase Physical Activity;Understanding of Exercise Prescription;Increase Strength and Stamina  Increase Physical Activity;Understanding of Exercise Prescription;Increase Strength and Stamina  Increase Physical Activity;Understanding of Exercise Prescription;Increase Strength and  Stamina     Comments  Theordore continues to do well in rehab.  He is now up to 5lbs weights and doing level 6 on the BioStep  His attendance has gotten better. We will continue to monitor his progress.   Tyresse has been doing well in rehab.  He now up to level 7 on the BioStep!  He also continues to lose weight. We will  continue to monitor his progress.   Jayce has been doing well in rehab. He is losing weight!!  He has been doing weights at home and walking some.  He is getting in two extra days a week at home for 20-30 min.  Try start walk more at home. We talked about a goal being to walk down here and out by the time he graduates.       Expected Outcomes  Short: Continue to increase workloads.  Long: Continue to exercise on his own.   Short: Work on walking down to rehab.  Long: Continue to exercise on off days.   Short: Work on walking down to rehab.  Long: Continue to exercise on off days.         Discharge Exercise Prescription (Final Exercise Prescription Changes): Exercise Prescription Changes - 03/31/18 1500      Response to Exercise   Blood Pressure (Admit)  128/70    Blood Pressure (Exit)  124/74    Heart Rate (Admit)  79 bpm    Heart Rate (Exercise)  90 bpm    Heart Rate (Exit)  84 bpm    Oxygen Saturation (Admit)  95 %    Oxygen Saturation (Exercise)  88 %    Oxygen Saturation (Exit)  95 %    Rating of Perceived Exertion (Exercise)  13    Perceived Dyspnea (Exercise)  2    Symptoms  SOB on treadmill    Duration  Continue with 45 min of aerobic exercise without signs/symptoms of physical distress.    Intensity  THRR unchanged      Progression   Progression  Continue to progress workloads to maintain intensity without signs/symptoms of physical distress.    Average METs  2.3      Resistance Training   Training Prescription  Yes    Weight  4 lbs    Reps  10-15      Interval Training   Interval Training  No      Oxygen   Oxygen  Continuous    Liters  4      Treadmill   MPH   0.8    Grade  0    Minutes  15    METs  1.6      T5 Nustep   Level  4    Minutes  15    METs  2.3      Biostep-RELP   Level  7    Minutes  15    METs  3      Home Exercise Plan   Plans to continue exercise at  Home (comment) walking, chair exercises    Frequency  Add 2 additional days to program exercise sessions.    Initial Home Exercises Provided  02/25/18       Nutrition:  Target Goals: Understanding of nutrition guidelines, daily intake of sodium <1514m, cholesterol <2055m calories 30% from fat and 7% or less from saturated fats, daily to have 5 or more servings of fruits and vegetables.  Biometrics: Pre Biometrics - 01/20/18 1540      Pre Biometrics   Height  5' 9" (1.753 m)    Weight  318 lb 14.4 oz (144.7 kg)  (Abnormal)     Waist Circumference  56.5 inches    Hip Circumference  46.5 inches    Waist to Hip Ratio  1.22 %    BMI (Calculated)  47.07    Single Leg Stand  1.93 seconds  Nutrition Therapy Plan and Nutrition Goals: Nutrition Therapy & Goals - 03/04/18 1055      Nutrition Therapy   Diet  DM/ TLC    Drug/Food Interactions  Purine/Gout    Protein (specify units)  13oz    Fiber  30 grams    Whole Grain Foods  3 servings    Saturated Fats  16 max. grams    Fruits and Vegetables  5 servings/day 8 ideal. eats 2 meals per day currently    Sodium  1500 grams      Personal Nutrition Goals   Nutrition Goal  Increase fluid intake throughout the day, aiming for 48-64oz total to help with fluid retention and thinning mucus Currently drinks 2 bottles of water per day plus 1-3 diet sodas    Personal Goal #2  Continue to choose lower sodium options for snack foods. When eating out, use the guidelines provided to help you order foods that are lower in sodium and fat    Personal Goal #3  Consider eating dinner at home once per week, slowly progressing to eating more meals at home over the course of the week which will help better control parameters like  sodium, fat, portion sizes and total calories    Comments  He and his wife currently eat out most if not all nights of the week. He does not eat lunch but he does try to limit dietary sources of added sugar and sodium. He has also been working to reduce his portion sizes and to choose more vegetables at supper      Intervention Plan   Intervention  Nutrition handout(s) given to patient.;Prescribe, educate and counsel regarding individualized specific dietary modifications aiming towards targeted core components such as weight, hypertension, lipid management, diabetes, heart failure and other comorbidities. COPD handout, Eating out handout, Low sodium nutrition therapy handout    Expected Outcomes  Short Term Goal: Understand basic principles of dietary content, such as calories, fat, sodium, cholesterol and nutrients.;Short Term Goal: A plan has been developed with personal nutrition goals set during dietitian appointment.;Long Term Goal: Adherence to prescribed nutrition plan.       Nutrition Assessments:   Nutrition Goals Re-Evaluation: Nutrition Goals Re-Evaluation    [place] Name 02/27/18 1154 03/04/18 1104           Goals   Current Weight  317 lb (143.8 kg)  -      Nutrition Goal  Tukker want sto lose 100 pounds total. Try not to overeat.  Consider eating dinner at home once per week, slowly progressing to eating more meals at home over the course of the week which will help better control parameters like sodium, fat, portion sizes and total calories      Comment  Qamar has lost 5 pounds and wants to lose at least 20 pounds before the program is over.  He and his wife currently eat out most if not all nights of the week. They order a variety of meals, some which are high in sodium and fat      Expected Outcome  Short: lose 5 pounds within the next week. Long: lose 20 pounds by the end of [facility]  He will eat supper at home at least one additional meal per week        Personal Goal #2  Re-Evaluation   Personal Goal #2  -  Increase fluid intake throughout the day, aiming for 48-64oz total to help with fluid retention and thinning mucus  Personal Goal #3 Re-Evaluation   Personal Goal #3  -  Continue to choose lower sodium options for snack foods. When eating out, use the guidelines provided to help you order foods that are lower in sodium and fat         Nutrition Goals Discharge (Final Nutrition Goals Re-Evaluation): Nutrition Goals Re-Evaluation - 03/04/18 1104      Goals   Nutrition Goal  Consider eating dinner at home once per week, slowly progressing to eating more meals at home over the course of the week which will help better control parameters like sodium, fat, portion sizes and total calories    Comment  He and his wife currently eat out most if not all nights of the week. They order a variety of meals, some which are high in sodium and fat    Expected Outcome  He will eat supper at home at least one additional meal per week      Personal Goal #2 Re-Evaluation   Personal Goal #2  Increase fluid intake throughout the day, aiming for 48-64oz total to help with fluid retention and thinning mucus      Personal Goal #3 Re-Evaluation   Personal Goal #3  Continue to choose lower sodium options for snack foods. When eating out, use the guidelines provided to help you order foods that are lower in sodium and fat       Psychosocial: Target Goals: Acknowledge presence or absence of significant depression and/or stress, maximize coping skills, provide positive support system. Participant is able to verbalize types and ability to use techniques and skills needed for reducing stress and depression.   Initial Review & Psychosocial Screening: Initial Psych Review & Screening - 01/20/18 1437      Initial Review   Current issues with  Current Sleep Concerns;Current Stress Concerns    Source of Stress Concerns  Chronic Illness;Unable to perform yard/household activities     Comments  COPD is most stressful issue      [place]?  Yes    Comments  He can look to his wife and children for support.      Barriers   Psychosocial barriers to participate in program  The patient should benefit from training in stress management and relaxation.      Screening Interventions   Interventions  Encouraged to exercise;Program counselor consult;To provide support and resources with identified psychosocial needs;Provide feedback about the scores to participant    Expected Outcomes  Short Term goal: Utilizing psychosocial counselor, staff and physician to assist with identification of specific Stressors or current issues interfering with healing process. Setting desired goal for each stressor or current issue identified.;Long Term Goal: Stressors or current issues are controlled or eliminated.;Short Term goal: Identification and review with participant of any Quality of Life or Depression concerns found by scoring the questionnaire.;Long Term goal: The participant improves quality of Life and PHQ9 Scores as seen by post scores and/or verbalization of changes       Quality of Life Scores:  Scores of 19 and below usually indicate a poorer quality of life in these areas.  A difference of  2-3 points is a clinically meaningful difference.  A difference of 2-3 points in the total score of the Quality of Life Index has been associated with significant improvement in overall quality of life, self-image, physical symptoms, and general health in studies assessing change in quality of life.  PHQ-9: Recent Review Flowsheet Data  Depression screen [facility] 2/9 01/20/2018 01/19/2015 07/19/2014 05/31/2014 05/31/2014   Decreased Interest 1 0 0 0 0   Down, Depressed, Hopeless 0 0 0 0 0   PHQ - 2 Score 1 0 0 0 0   Altered sleeping 1 - - - -   Tired, decreased energy 2 - - - -   Change in appetite 0 - - - -   Feeling bad or failure about yourself  1 - - - -   Trouble  concentrating 2 - - - -   Moving slowly or fidgety/restless 0 - - - -   Suicidal thoughts 0 - - - -   PHQ-9 Score 7 - - - -   Difficult doing work/chores Somewhat difficult - - - -     Interpretation of Total Score  Total Score Depression Severity:  1-4 = Minimal depression, 5-9 = Mild depression, 10-14 = Moderate depression, 15-19 = Moderately severe depression, 20-27 = Severe depression   Psychosocial Evaluation and Intervention: Psychosocial Evaluation - 03/25/18 1230      Psychosocial Evaluation & Interventions   Interventions  Encouraged to exercise with the program and follow exercise prescription    Comments  Counselor met with Mr. Nehme Morocho) today for initial psychosocial evaluation.  He is a 67 year old who struggles with COPD.  Eliyohu has a strong support system with a spouse of almost 65 years; (4) adult children and a brother and dad all in [place].  He reports sleeping in a recliner most nights and gets maybe 7-8 hours of sleep.  Misty has a good appetite.  He has multiple health issues in addition to his pulmonary diagnosis; including diabetes; high blood pressure and cholesterol as well as obesity.  Kadien reports having been diagnosed with depression at one point in his life, and took medications briefly - but denies any current symptoms and states he is typically in a positive mood.  Jayziah states his health is his primary stressor.  He has goals to lose weight; get off the oxygen and breathe better overall.   Nyzier has already noticed progress in these goals with loss of a few pounds and not having to sit down to catch his breath as often while doing normal activities.  Counselor commended Kyro for his progress made.  Staff will follow.    Expected Outcomes  Short:  Rolland will meet with the dietician to address his weight loss goals.   Long:  [place] will continue to make positive self-care choices in his diet and exercise long term.      Continue Psychosocial Services   Follow up required by  staff       Psychosocial Re-Evaluation: Psychosocial Re-Evaluation    [place] Name 02/27/18 1158             Psychosocial Re-Evaluation   Current issues with  Current Sleep Concerns;Current Stress Concerns       Comments  Tavish has been sleeping better since he has started exercising. He cannot tolerate his CPAP so he does not wear it. He is only stressed with his COPD and not being able to breath at times.        Expected Outcomes  Short: attend [facility] regularly to improve stress. Long: maintain exercise to keep stress at a minimum.       Interventions  Encouraged to attend Pulmonary Rehabilitation for the exercise       Continue Psychosocial Services   Follow up required by staff  Psychosocial Discharge (Final Psychosocial Re-Evaluation): Psychosocial Re-Evaluation - 02/27/18 1158      Psychosocial Re-Evaluation   Current issues with  Current Sleep Concerns;Current Stress Concerns    Comments  Mehmet has been sleeping better since he has started exercising. He cannot tolerate his CPAP so he does not wear it. He is only stressed with his COPD and not being able to breath at times.     Expected Outcomes  Short: attend [facility] regularly to improve stress. Long: maintain exercise to keep stress at a minimum.    Interventions  Encouraged to attend Pulmonary Rehabilitation for the exercise    Continue Psychosocial Services   Follow up required by staff       Education: Education Goals: Education classes will be provided on a weekly basis, covering required topics. Participant will state understanding/return demonstration of topics presented.  Learning Barriers/Preferences: Learning Barriers/Preferences - 01/20/18 1441      Learning Barriers/Preferences   Learning Barriers  Hearing    Learning Preferences  None       Education Topics:  Initial Evaluation Education: - Verbal, written and demonstration of respiratory meds, oximetry and breathing techniques. Instruction  on use of nebulizers and MDIs and importance of monitoring MDI activations.   Pulmonary Rehab from 04/06/2018 in [facility] Cardiac and Pulmonary Rehab  Date  01/20/18  Educator  [facility]  Instruction Review Code  1- Verbalizes Understanding      General Nutrition Guidelines/Fats and Fiber: -Group instruction provided by verbal, written material, models and posters to present the general guidelines for heart healthy nutrition. Gives an explanation and review of dietary fats and fiber.   Pulmonary Rehab from 04/06/2018 in [facility] Cardiac and Pulmonary Rehab  Date  03/30/18  Educator  CR  Instruction Review Code  1- Verbalizes Understanding      Controlling Sodium/Reading Food Labels: -Group verbal and written material supporting the discussion of sodium use in heart healthy nutrition. Review and explanation with models, verbal and written materials for utilization of the food label.   Pulmonary Rehab from 04/06/2018 in [facility] Cardiac and Pulmonary Rehab  Date  04/06/18  Educator  CR  Instruction Review Code  1- Verbalizes Understanding      Exercise Physiology & General Exercise Guidelines: - Group verbal and written instruction with models to review the exercise physiology of the cardiovascular system and associated critical values. Provides general exercise guidelines with specific guidelines to those with heart or lung disease.    Pulmonary Rehab from 04/06/2018 in [facility] Cardiac and Pulmonary Rehab  Date  03/06/18  Educator  [facility]  Instruction Review Code  1- Verbalizes Understanding      Aerobic Exercise & Resistance Training: - Gives group verbal and written instruction on the various components of exercise. Focuses on aerobic and resistive training programs and the benefits of this training and how to safely progress through these programs.   Flexibility, Balance, Mind/Body Relaxation: Provides group verbal/written instruction on the benefits of flexibility and balance training, including  mind/body exercise modes such as yoga, pilates and tai chi.  Demonstration and skill practice provided.   Stress and Anxiety: - Provides group verbal and written instruction about the health risks of elevated stress and causes of high stress.  Discuss the correlation between heart/lung disease and anxiety and treatment options. Review healthy ways to manage with stress and anxiety.   Depression: - Provides group verbal and written instruction on the correlation between heart/lung disease and depressed mood, treatment options, and the stigmas associated  with seeking treatment.   Pulmonary Rehab from 04/06/2018 in [facility] Cardiac and Pulmonary Rehab  Date  03/25/18  Educator  [facility]  Instruction Review Code  1- Verbalizes Understanding      Exercise & Equipment Safety: - Individual verbal instruction and demonstration of equipment use and safety with use of the equipment.   Pulmonary Rehab from 04/06/2018 in [facility] Cardiac and Pulmonary Rehab  Date  01/20/18  Educator  [facility]  Instruction Review Code  1- Verbalizes Understanding      Infection Prevention: - Provides verbal and written material to individual with discussion of infection control including proper hand washing and proper equipment cleaning during exercise session.   Pulmonary Rehab from 04/06/2018 in [facility] Cardiac and Pulmonary Rehab  Date  01/20/18  Educator  [facility]  Instruction Review Code  1- Verbalizes Understanding      Falls Prevention: - Provides verbal and written material to individual with discussion of falls prevention and safety.   Pulmonary Rehab from 04/06/2018 in [facility] Cardiac and Pulmonary Rehab  Date  01/20/18  Educator  [facility]  Instruction Review Code  1- Verbalizes Understanding      Diabetes: - Individual verbal and written instruction to review signs/symptoms of diabetes, desired ranges of glucose level fasting, after meals and with exercise. Advice that pre and post exercise glucose checks will be done for 3 sessions  at entry of program.   Chronic Lung Diseases: - Group verbal and written instruction to review updates, respiratory medications, advancements in procedures and treatments. Discuss use of supplemental oxygen including available portable oxygen systems, continuous and intermittent flow rates, concentrators, personal use and safety guidelines. Review proper use of inhaler and spacers. Provide informative websites for self-education.    Pulmonary Rehab from 04/06/2018 in [facility] Cardiac and Pulmonary Rehab  Date  03/04/18  Educator  [facility]  Instruction Review Code  1- Verbalizes Understanding      Energy Conservation: - Provide group verbal and written instruction for methods to conserve energy, plan and organize activities. Instruct on pacing techniques, use of adaptive equipment and posture/positioning to relieve shortness of breath.   Pulmonary Rehab from 04/06/2018 in [facility] Cardiac and Pulmonary Rehab  Date  02/04/18  Educator  [facility]  Instruction Review Code  1- Verbalizes Understanding      Triggers and Exacerbations: - Group verbal and written instruction to review types of environmental triggers and ways to prevent exacerbations. Discuss weather changes, air quality and the benefits of nasal washing. Review warning signs and symptoms to help prevent infections. Discuss techniques for effective airway clearance, coughing, and vibrations.   Pulmonary Rehab from 04/06/2018 in [facility] Cardiac and Pulmonary Rehab  Date  03/20/18  Educator  [facility]  Instruction Review Code  1- Verbalizes Understanding      AED/CPR: - Group verbal and written instruction with the use of models to demonstrate the basic use of the AED with the basic ABC's of resuscitation.   Anatomy and Physiology of the Lungs: - Group verbal and written instruction with the use of models to provide basic lung anatomy and physiology related to function, structure and complications of lung disease.   Pulmonary Rehab from 04/06/2018 in [facility]  Cardiac and Pulmonary Rehab  Date  02/18/18  Educator  [facility]  Instruction Review Code  1- Verbalizes Understanding      Anatomy & Physiology of the Heart: - Group verbal and written instruction and models provide basic cardiac anatomy and physiology, with the coronary electrical and arterial systems. Review of  Valvular disease and Heart Failure   Cardiac Medications: - Group verbal and written instruction to review commonly prescribed medications for heart disease. Reviews the medication, class of the drug, and side effects.   Know Your Numbers and Risk Factors: -Group verbal and written instruction about important numbers in your health.  Discussion of what are risk factors and how they play a role in the disease process.  Review of Cholesterol, Blood Pressure, Diabetes, and BMI and the role they play in your overall health.   Pulmonary Rehab from 04/06/2018 in [facility] Cardiac and Pulmonary Rehab  Date  03/18/18  Educator  [facility]  Instruction Review Code  1- Verbalizes Understanding      Sleep Hygiene: -Provides group verbal and written instruction about how sleep can affect your health.  Define sleep hygiene, discuss sleep cycles and impact of sleep habits. Review good sleep hygiene tips.    Pulmonary Rehab from 04/06/2018 in [facility] Cardiac and Pulmonary Rehab  Date  03/11/18  Educator  [facility]  Instruction Review Code  1- Verbalizes Understanding      Other: -Provides group and verbal instruction on various topics (see comments)    Knowledge Questionnaire Score: Knowledge Questionnaire Score - 01/20/18 1440      Knowledge Questionnaire Score   Pre Score  13/18 reviewed with patient        Core Components/Risk Factors/Patient Goals at Admission: Personal Goals and Risk Factors at Admission - 01/20/18 1443      Core Components/Risk Factors/Patient Goals on Admission    Weight Management  Yes;Obesity;Weight Loss    Intervention  Weight Management: Develop a combined nutrition and  exercise program designed to reach desired caloric intake, while maintaining appropriate intake of nutrient and fiber, sodium and fats, and appropriate energy expenditure required for the weight goal.;Weight Management: Provide education and appropriate resources to help participant work on and attain dietary goals.;Weight Management/Obesity: Establish reasonable short term and long term weight goals.    Admit Weight  318 lb 14.4 oz (144.7 kg)    Goal Weight: Short Term  313 lb (142 kg)    Goal Weight: Long Term  200 lb (90.7 kg)    Expected Outcomes  Short Term: Continue to assess and modify interventions until short term weight is achieved;Long Term: Adherence to nutrition and physical activity/exercise program aimed toward attainment of established weight goal;Weight Maintenance: Understanding of the daily nutrition guidelines, which includes 25-35% calories from fat, 7% or less cal from saturated fats, less than 238m cholesterol, less than 1.5gm of sodium, & 5 or more servings of fruits and vegetables daily;Weight Loss: Understanding of general recommendations for a balanced deficit meal plan, which promotes 1-2 lb weight loss per week and includes a negative energy balance of [phone] kcal/d;Understanding recommendations for meals to include 15-35% energy as protein, 25-35% energy from fat, 35-60% energy from carbohydrates, less than 2062mof dietary cholesterol, 20-35 gm of total fiber daily;Understanding of distribution of calorie intake throughout the day with the consumption of 4-5 meals/snacks    Improve shortness of breath with ADL's  Yes    Intervention  Provide education, individualized exercise plan and daily activity instruction to help decrease symptoms of SOB with activities of daily living.    Expected Outcomes  Short Term: Improve cardiorespiratory fitness to achieve a reduction of symptoms when performing ADLs;Long Term: Be able to perform more ADLs without symptoms or delay the onset of  symptoms    Diabetes  Yes    Intervention  Provide education  about signs/symptoms and action to take for hypo/hyperglycemia.;Provide education about proper nutrition, including hydration, and aerobic/resistive exercise prescription along with prescribed medications to achieve blood glucose in normal ranges: Fasting glucose 65-99 mg/dL    Expected Outcomes  Short Term: Participant verbalizes understanding of the signs/symptoms and immediate care of hyper/hypoglycemia, proper foot care and importance of medication, aerobic/resistive exercise and nutrition plan for blood glucose control.;Long Term: Attainment of HbA1C < 7%.    Heart Failure  Yes    Intervention  Provide a combined exercise and nutrition program that is supplemented with education, support and counseling about heart failure. Directed toward relieving symptoms such as shortness of breath, decreased exercise tolerance, and extremity edema.    Expected Outcomes  Improve functional capacity of life;Short term: Attendance in program 2-3 days a week with increased exercise capacity. Reported lower sodium intake. Reported increased fruit and vegetable intake. Reports medication compliance.;Long term: Adoption of self-care skills and reduction of barriers for early signs and symptoms recognition and intervention leading to self-care maintenance.;Short term: Daily weights obtained and reported for increase. Utilizing diuretic protocols set by physician.    Hypertension  Yes    Intervention  Provide education on lifestyle modifcations including regular physical activity/exercise, weight management, moderate sodium restriction and increased consumption of fresh fruit, vegetables, and low fat dairy, alcohol moderation, and smoking cessation.;Monitor prescription use compliance.    Expected Outcomes  Short Term: Continued assessment and intervention until BP is < 140/47m HG in hypertensive participants. < 130/885mHG in hypertensive participants with  diabetes, heart failure or chronic kidney disease.;Long Term: Maintenance of blood pressure at goal levels.    Lipids  Yes    Intervention  Provide education and support for participant on nutrition & aerobic/resistive exercise along with prescribed medications to achieve LDL <7018mHDL >23m61m  Expected Outcomes  Long Term: Cholesterol controlled with medications as prescribed, with individualized exercise RX and with personalized nutrition plan. Value goals: LDL < 70mg70mL > 40 mg.;Short Term: Participant states understanding of desired cholesterol values and is compliant with medications prescribed. Participant is following exercise prescription and nutrition guidelines.       Core Components/Risk Factors/Patient Goals Review:  Goals and Risk Factor Review    Row Name 02/27/18 1144             Core Components/Risk Factors/Patient Goals Review   Personal Goals Review  Weight Management/Obesity;Diabetes;Hypertension;Lipids;Heart Failure;Improve shortness of breath with ADL's       Review  Jandel Roccolose 5 pounds since starting the program. He wants to continue to lose weight. His blood pressure has been stable and within normal limits. He takes blood pressure medication twice a day. He has not had his lipids checked in about six months. He is due for a check up on his lipids soon. Izick can walk to the car now without getting too short of breath.        Expected Outcomes  Short: work on weight loss and get lipids checked. Long: lose 20 pounds.          Core Components/Risk Factors/Patient Goals at Discharge (Final Review):  Goals and Risk Factor Review - 02/27/18 1144      Core Components/Risk Factors/Patient Goals Review   Personal Goals Review  Weight Management/Obesity;Diabetes;Hypertension;Lipids;Heart Failure;Improve shortness of breath with ADL's    Review  Onesimo Ifeanyichukwulose 5 pounds since starting the program. He wants to continue to lose weight. His blood pressure has been stable and  within normal  limits. He takes blood pressure medication twice a day. He has not had his lipids checked in about six months. He is due for a check up on his lipids soon. Chadley can walk to the car now without getting too short of breath.     Expected Outcomes  Short: work on weight loss and get lipids checked. Long: lose 20 pounds.       ITP Comments: ITP Comments    Row Name 01/20/18 1414 02/16/18 0840 03/16/18 1527 04/13/18 0904     ITP Comments  Medical Evaluation completed. Chart sent for review and changes to Dr. Emily Filbert Director of [place]. Diagnosis can be found in [facility] encounter 01/05/18   30 day review completed. ITP sent to Dr. Emily Filbert Director of [place]. Continue with ITP unless changes are made by physician   30 day review completed. ITP sent to Dr. Emily Filbert Director of [place]. Continue with ITP unless changes are made by physician   30 day review completed. ITP sent to Dr. Emily Filbert Director of [place]. Continue with ITP unless changes are made by physician       Comments: 30 day review

## 2018-04-14 ENCOUNTER — Encounter: Payer: Self-pay | Admitting: Gastroenterology

## 2018-04-14 ENCOUNTER — Ambulatory Visit: Payer: Medicare HMO | Admitting: Gastroenterology

## 2018-04-14 VITALS — BP 116/62 | HR 60 | Ht 68.0 in | Wt 311.2 lb

## 2018-04-14 DIAGNOSIS — K2210 Ulcer of esophagus without bleeding: Secondary | ICD-10-CM | POA: Diagnosis not present

## 2018-04-14 DIAGNOSIS — D126 Benign neoplasm of colon, unspecified: Secondary | ICD-10-CM | POA: Diagnosis not present

## 2018-04-14 DIAGNOSIS — K2100 Gastro-esophageal reflux disease with esophagitis, without bleeding: Secondary | ICD-10-CM

## 2018-04-14 DIAGNOSIS — Z86010 Personal history of colonic polyps: Secondary | ICD-10-CM

## 2018-04-14 DIAGNOSIS — D500 Iron deficiency anemia secondary to blood loss (chronic): Secondary | ICD-10-CM | POA: Diagnosis not present

## 2018-04-14 DIAGNOSIS — Z1211 Encounter for screening for malignant neoplasm of colon: Secondary | ICD-10-CM

## 2018-04-14 NOTE — Patient Instructions (Signed)
Your recall colonoscopy and endoscopy is due in May 2020 , we will mail you out a reminder letter  Follow up labs in 6 months  Hgb and Ferritin   Thank you for choosing [facility]  Karleen Hampshire Nand[place]am,MD

## 2018-04-15 ENCOUNTER — Encounter: Payer: Medicare HMO | Admitting: *Deleted

## 2018-04-15 DIAGNOSIS — Z79899 Other long term (current) drug therapy: Secondary | ICD-10-CM | POA: Diagnosis not present

## 2018-04-15 DIAGNOSIS — I10 Essential (primary) hypertension: Secondary | ICD-10-CM | POA: Diagnosis not present

## 2018-04-15 DIAGNOSIS — J449 Chronic obstructive pulmonary disease, unspecified: Secondary | ICD-10-CM

## 2018-04-15 DIAGNOSIS — Z7984 Long term (current) use of oral hypoglycemic drugs: Secondary | ICD-10-CM | POA: Diagnosis not present

## 2018-04-15 DIAGNOSIS — E119 Type 2 diabetes mellitus without complications: Secondary | ICD-10-CM | POA: Diagnosis not present

## 2018-04-15 DIAGNOSIS — Z9981 Dependence on supplemental oxygen: Secondary | ICD-10-CM | POA: Diagnosis not present

## 2018-04-15 DIAGNOSIS — Z7982 Long term (current) use of aspirin: Secondary | ICD-10-CM | POA: Diagnosis not present

## 2018-04-15 DIAGNOSIS — I2510 Atherosclerotic heart disease of native coronary artery without angina pectoris: Secondary | ICD-10-CM | POA: Diagnosis not present

## 2018-04-15 DIAGNOSIS — E785 Hyperlipidemia, unspecified: Secondary | ICD-10-CM | POA: Diagnosis not present

## 2018-04-15 DIAGNOSIS — M1990 Unspecified osteoarthritis, unspecified site: Secondary | ICD-10-CM | POA: Diagnosis not present

## 2018-04-15 NOTE — Progress Notes (Signed)
Alfred Carney    [id number]    Feb 02, 1951  Primary Care Physician:Howell, Bryn Gulling, MD  Referring Physician: Helane Rima, MD [address] [place], [place] [id number]  Chief complaint: Iron deficiency anemia  HPI: 67 year old male with COPD, OSA on continuous O2 here for follow-up of iron deficiency anemia EGD and colonoscopy May 2019 with findings of severe erosive reflux esophagitis and multiple adenomatous polyps removed from colon, largest polyp was over 2 cm . He is taking omeprazole 40 mg daily and also on oral iron replacement therapy. Overall doing better. Denies any nausea, vomiting, abdominal pain, melena or bright red blood per rectum Currently going to pulmonary rehab and feels it is helping with his breathing and also overall health.  On repeat labs hemoglobin improved to 15 on March 16, 2018 from hemoglobin 10.9 on November 19, 2017 Ferritin improved to 23 from 9   Outpatient Encounter Medications as of 04/14/2018  Medication Sig  . albuterol (PROVENTIL HFA;VENTOLIN HFA) 108 (90 Base) MCG/ACT inhaler Inhale 2 puffs into the lungs every 6 (six) hours as needed for wheezing or shortness of breath.  Marland Kitchen amLODipine (NORVASC) 5 MG tablet Take 5 mg by mouth daily.   . Calcium Carbonate-Simethicone (ALKA-SELTZER HEARTBURN + GAS) 750-80 MG CHEW Chew by mouth as needed.  . Cholecalciferol (VITAMIN D PO) Take 1 capsule by mouth 2 (two) times daily.  . ferrous sulfate 325 (65 FE) MG tablet Take 325 mg by mouth daily with breakfast.   . furosemide (LASIX) 20 MG tablet Take 1 tablet (20 mg total) by mouth daily as needed.  . irbesartan (AVAPRO) 300 MG tablet Take 300 mg by mouth daily.   Marland Kitchen ketoconazole (NIZORAL) 2 % shampoo Apply 1 application topically every other day.   . metFORMIN (GLUCOPHAGE) 500 MG tablet Take 500 mg by mouth 2 (two) times daily with a meal.   . metoprolol succinate (TOPROL-XL) 25 MG 24 hr tablet Take 25 mg by mouth daily.  Marland Kitchen  omeprazole (PRILOSEC) 40 MG capsule Take 1 capsule (40 mg total) by mouth daily.  . OXYGEN Inhale 2 L into the lungs continuous.   . rosuvastatin (CRESTOR) 10 MG tablet Take 5 mg by mouth daily.  . tamsulosin (FLOMAX) 0.4 MG CAPS capsule Take 0.4 mg by mouth daily.   . nitroGLYCERIN (NITROSTAT) 0.4 MG SL tablet Place 1 tablet (0.4 mg total) under the tongue every 5 (five) minutes x 3 doses as needed for chest pain. (Patient not taking: Reported on 04/14/2018)   No facility-administered encounter medications on file as of 04/14/2018.     Allergies as of 04/14/2018  . (No Known Allergies)    Past Medical History:  Diagnosis Date  . Chronic obstructive pulmonary disease ([place]) 2008   Moderate  . Colon polyps   . Community acquired pneumonia   . Coronary artery disease   . Degenerative joint disease   . Diabetes mellitus without complication ([place])    type 2  . Emphysema lung ([place])   . Enlarged liver    fatty liver by '09 CT  . Gastroesophageal reflux disease   . Gout   . H/O hiatal hernia   . History of Rocky Mountain spotted fever    Possible history of Rocky [place] Spotted Fever  . Hyperlipidemia   . Hypertension   . Hypokalemia    diuretic induced, resolved  . Microcytic anemia    iron pills  . Morbid obesity (  HCC)   . Shortness of breath   . Skin cancer    skin cancer lip removed  . Sleep apnea    not currently using CPAP 05/20/13  . 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 CARDIAC CATHETERIZATION  2007   Ejection fraction is estimated at 60%  . CHOLECYSTECTOMY    . COLONOSCOPY WITH PROPOFOL N/A 01/22/2018   Procedure: COLONOSCOPY WITH PROPOFOL;  Surgeon: Mauri Pole, MD;  Location: [facility];  Service: Endoscopy;  Laterality: N/A;  . ESOPHAGOGASTRODUODENOSCOPY (EGD) WITH PROPOFOL N/A 01/22/2018   Procedure: ESOPHAGOGASTRODUODENOSCOPY (EGD) WITH PROPOFOL;  Surgeon:  Mauri Pole, MD;  Location: [facility];  Service: Endoscopy;  Laterality: N/A;  . HARDWARE REMOVAL Left 05/26/2013   Procedure: HARDWARE REMOVAL;  Surgeon: Newt Minion, MD;  Location: [facility];  Service: Orthopedics;  Laterality: Left;  Left Total Hip Arthroplasty, Removal of Deep Hardware  . HIP SURGERY     Status post left hip surgery with bone grafting  . LEFT HEART CATHETERIZATION WITH CORONARY ANGIOGRAM N/A 01/31/2014   Procedure: LEFT HEART CATHETERIZATION WITH CORONARY ANGIOGRAM;  Surgeon: Burnell Blanks, MD;  Location: [facility] CATH LAB;  Service: Cardiovascular;  Laterality: N/A;  . THORACOABDOMINAL AORTIC ANEURYSM REPAIR     with right femoral and left iliac BPG and reimplantation of renal arteries.  . TONSILLECTOMY    . TONSILLECTOMY    . TOTAL HIP ARTHROPLASTY Left 05/26/2013   Procedure: TOTAL HIP ARTHROPLASTY;  Surgeon: Newt Minion, MD;  Location: [facility];  Service: Orthopedics;  Laterality: Left;  Left Total Hip Arthroplasty, Removal Deep Hardware    Family History  Problem Relation Age of Onset  . Osteoarthritis Mother   . Diabetes Mother   . Heart disease Father        Coronary Artery Disease  . Multiple sclerosis Sister   . Factor V Leiden deficiency Sister   . Lung cancer Maternal Grandfather        smoked  . Emphysema Sister        smoked    Social History   Socioeconomic History  . Marital status: Married    Spouse name: Not on file  . Number of children: 4  . Years of education: Not on file  . Highest education level: Not on file  Occupational History  . Occupation: architectural drawing-retired  Social Needs  . Financial resource strain: Not on file  . Food insecurity:    Worry: Not on file    Inability: Not on file  . Transportation needs:    Medical: Not on file    Non-medical: Not on file  Tobacco Use  . Smoking status: Former Smoker    Packs/day: 1.00    Years: 35.00    Pack years: 35.00    Types: Cigarettes    Last attempt to  quit: 09/16/2006    Years since quitting: 11.5  . Smokeless tobacco: Never Used  Substance and Sexual Activity  . Alcohol use: No    Alcohol/week: 0.0 oz  . Drug use: No  . Sexual activity: Yes  Lifestyle  . Physical activity:    Days per week: Not on file    Minutes per session: Not on file  . Stress: Not on file  Relationships  . Social connections:    Talks on phone: Not on file    Gets together: Not on file  Attends religious service: Not on file    Active member of club or organization: Not on file    Attends meetings of clubs or organizations: Not on file    Relationship status: Not on file  . Intimate partner violence:    Fear of current or ex partner: Not on file    Emotionally abused: Not on file    Physically abused: Not on file    Forced sexual activity: Not on file  Other Topics Concern  . Not on file  Social History Narrative  . Not on file      Review of systems: Review of Systems  Constitutional: Negative for fever and chills.  HENT: Negative.   Eyes: Negative for blurred vision.  Respiratory: Positive for cough, shortness of breath and wheezing on home oxygen.   Cardiovascular: Negative for chest pain and palpitations.  Gastrointestinal: as per HPI Genitourinary: Negative for dysuria, urgency, frequency and hematuria.  Musculoskeletal: Positive for myalgias, back pain and joint pain.  Skin: Negative for itching and rash.  Neurological: Negative for dizziness, tremors, focal weakness, seizures and loss of consciousness.  Positive for problem with balance Endo/Heme/Allergies: Positive for seasonal allergies.  Psychiatric/Behavioral: Negative for depression, suicidal ideas and hallucinations.  All other systems reviewed and are negative.   Physical Exam: Vitals:   04/14/18 0921  BP: 116/62  Pulse: 60   Body mass index is 47.33 kg/m. Gen:      No acute distress, oxygen through nasal cannula HEENT:  EOMI, sclera anicteric Neck:     No masses; no  thyromegaly Lungs:    Clear to auscultation bilaterally; normal respiratory effort CV:         Regular rate and rhythm; no murmurs Abd:      + bowel sounds; soft, non-tender; no palpable masses, no distension Ext:    No edema; adequate peripheral perfusion Skin:      Warm and dry; no rash Neuro: alert and oriented x 3 Psych: normal mood and affect  Data Reviewed:  Reviewed labs, radiology imaging, old records and pertinent past GI work up  EGD Jan 22, 2018 LA grade C erosive reflux esophagitis  Colonoscopy Jan 22, 2018 - One 5 mm polyp in the cecum, removed with a cold snare. Resected and retrieved. - One 2 mm polyp at the recto-sigmoid colon, removed with a cold biopsy forceps. Resected and retrieved. - One 8 mm polyp at the recto-sigmoid colon, removed with a hot snare. Resected and retrieved. - A single bleeding colonic angiodysplastic lesion. Treated with argon plasma coagulation (APC). - One 25 mm polyp in the ascending colon, removed with mucosal resection. Resected and retrieved. - Mild diverticulosis in the sigmoid colon, in the descending colon, in the transverse colon and in the ascending colon. - External and internal hemorrhoids. - Mucosal resection was performed. Resection and retrieval were complete.  Assessment and Plan/Recommendations:  67 year old male with history of aortic aneurysm repair, COPD, OSA, morbid obesity, iron deficiency anemia in the setting of severe erosive reflux esophagitis and multiple large adenomatous polyps Due for surveillance EGD and colonoscopy May 2020 Continue omeprazole 40 mg daily Discussed antireflux measures and lifestyle modifications Continue oral iron, ferrous sulfate 2-3 times daily with meals We will recheck hemoglobin and ferritin in 6 months to monitor    Raliegh Ip Denzil Magnuson , MD [phone]    CC: Helane Rima, MD

## 2018-04-15 NOTE — Progress Notes (Signed)
Daily Session Note  Patient Details  Name: Alfred Carney MRN: [id number] Date of Birth: 15-Oct-1950 Referring Provider:     Pulmonary Rehab from 01/20/2018 in [facility] Cardiac and Pulmonary Rehab  Referring Provider  Christinia Gully MD      Encounter Date: 04/15/2018  Check In: Session Check In - 04/15/18 1116      Check-In   Supervising physician immediately available to respond to emergencies  [facility] immediately available ER MD    Physician(s)  Drs. Guadalupe Dawn    Location  [facility]-Cardiac & Pulmonary Rehab    Staff Present  Justin Mend Lorre Nick, [place], RCEP, CCRP, Exercise Physiologist;Meredith Sherryll Burger, RN BSN    Medication changes reported      No    Fall or balance concerns reported     No    Warm-up and Cool-down  Performed as group-led instruction    Resistance Training Performed  Yes    VAD Patient?  No    PAD/SET Patient?  No      Pain Assessment   Currently in Pain?  No/denies        Exercise Prescription Changes - 04/14/18 1500      Response to Exercise   Blood Pressure (Admit)  130/70    Blood Pressure (Exit)  136/62    Heart Rate (Admit)  76 bpm    Heart Rate (Exercise)  95 bpm    Heart Rate (Exit)  74 bpm    Oxygen Saturation (Admit)  95 %    Oxygen Saturation (Exercise)  89 %    Oxygen Saturation (Exit)  94 %    Rating of Perceived Exertion (Exercise)  14    Perceived Dyspnea (Exercise)  2    Symptoms  SOB on treadmill    Duration  Continue with 45 min of aerobic exercise without signs/symptoms of physical distress.    Intensity  THRR unchanged      Progression   Progression  Continue to progress workloads to maintain intensity without signs/symptoms of physical distress.    Average METs  2.36      Resistance Training   Training Prescription  Yes    Weight  4 lbs    Reps  10-15      Interval Training   Interval Training  No      Oxygen   Oxygen  Continuous    Liters  4      Treadmill   MPH  1    Grade  0    Minutes  15     METs  1.77      T5 Nustep   Level  4    Minutes  15    METs  2.3      Biostep-RELP   Level  7    Minutes  15    METs  3      Home Exercise Plan   Plans to continue exercise at  Home (comment) walking, chair exercises    Frequency  Add 2 additional days to program exercise sessions.    Initial Home Exercises Provided  02/25/18       Social History   Tobacco Use  Smoking Status Former Smoker  . Packs/day: 1.00  . Years: 35.00  . Pack years: 35.00  . Types: Cigarettes  . Last attempt to quit: 09/16/2006  . Years since quitting: 11.5  Smokeless Tobacco Never Used    Goals Met:  Proper associated with RPD/PD & O2 Sat Independence with exercise equipment  Using PLB without cueing & demonstrates good technique Exercise tolerated well No report of cardiac concerns or symptoms Strength training completed today  Goals Unmet:  Not Applicable  Comments: Pt able to follow exercise prescription today without complaint.  Will continue to monitor for progression.    Dr. Emily Filbert is Medical Director for [place] and [facility].

## 2018-04-17 ENCOUNTER — Encounter: Payer: Self-pay | Admitting: Gastroenterology

## 2018-04-17 ENCOUNTER — Encounter: Payer: Medicare HMO | Attending: Internal Medicine | Admitting: *Deleted

## 2018-04-17 DIAGNOSIS — I10 Essential (primary) hypertension: Secondary | ICD-10-CM | POA: Insufficient documentation

## 2018-04-17 DIAGNOSIS — E785 Hyperlipidemia, unspecified: Secondary | ICD-10-CM | POA: Diagnosis not present

## 2018-04-17 DIAGNOSIS — Z7984 Long term (current) use of oral hypoglycemic drugs: Secondary | ICD-10-CM | POA: Insufficient documentation

## 2018-04-17 DIAGNOSIS — J449 Chronic obstructive pulmonary disease, unspecified: Secondary | ICD-10-CM | POA: Diagnosis not present

## 2018-04-17 DIAGNOSIS — Z9981 Dependence on supplemental oxygen: Secondary | ICD-10-CM | POA: Insufficient documentation

## 2018-04-17 DIAGNOSIS — E119 Type 2 diabetes mellitus without complications: Secondary | ICD-10-CM | POA: Diagnosis not present

## 2018-04-17 DIAGNOSIS — Z87891 Personal history of nicotine dependence: Secondary | ICD-10-CM | POA: Diagnosis not present

## 2018-04-17 DIAGNOSIS — Z8679 Personal history of other diseases of the circulatory system: Secondary | ICD-10-CM | POA: Insufficient documentation

## 2018-04-17 DIAGNOSIS — Z79899 Other long term (current) drug therapy: Secondary | ICD-10-CM | POA: Insufficient documentation

## 2018-04-17 DIAGNOSIS — Z85828 Personal history of other malignant neoplasm of skin: Secondary | ICD-10-CM | POA: Diagnosis not present

## 2018-04-17 DIAGNOSIS — Z7982 Long term (current) use of aspirin: Secondary | ICD-10-CM | POA: Diagnosis not present

## 2018-04-17 DIAGNOSIS — K219 Gastro-esophageal reflux disease without esophagitis: Secondary | ICD-10-CM | POA: Insufficient documentation

## 2018-04-17 DIAGNOSIS — M1990 Unspecified osteoarthritis, unspecified site: Secondary | ICD-10-CM | POA: Insufficient documentation

## 2018-04-17 DIAGNOSIS — Z6841 Body Mass Index (BMI) 40.0 and over, adult: Secondary | ICD-10-CM | POA: Insufficient documentation

## 2018-04-17 DIAGNOSIS — I2510 Atherosclerotic heart disease of native coronary artery without angina pectoris: Secondary | ICD-10-CM | POA: Diagnosis not present

## 2018-04-17 DIAGNOSIS — D509 Iron deficiency anemia, unspecified: Secondary | ICD-10-CM | POA: Diagnosis not present

## 2018-04-17 NOTE — Progress Notes (Signed)
Daily Session Note  Patient Details  Name: Alfred Carney MRN: [id number] Date of Birth: [birth year] Referring Provider:     Pulmonary Rehab from 01/20/2018 in [facility] Cardiac and Pulmonary Rehab  Referring Provider  Christinia Gully MD      Encounter Date: 04/17/2018  Check In: Session Check In - 04/17/18 1126      Check-In   Supervising physician immediately available to respond to emergencies  [facility] immediately available ER MD    Physician(s)  Drs. Saidecki and [facility] traffic [facility] & Pulmonary Rehab    Staff Present  Renita Papa, RN BSN;Chase Arnall Luan Pulling, [place], RCEP, CCRP, Exercise Physiologist;Joseph Tessie Fass RCP,RRT,BSRT    Medication changes reported      No    Fall or balance concerns reported     No    Warm-up and Cool-down  Performed as group-led instruction    Resistance Training Performed  Yes    VAD Patient?  No    PAD/SET Patient?  No      Pain Assessment   Currently in Pain?  No/denies          Social History   Tobacco Use  Smoking Status Former Smoker  . Packs/day: 1.00  . Years: 35.00  . Pack years: 35.00  . Types: Cigarettes  . Last attempt to quit: 09/16/2006  . Years since quitting: 11.5  Smokeless Tobacco Never Used    Goals Met:  Independence with exercise equipment Exercise tolerated well No report of cardiac concerns or symptoms Strength training completed today  Goals Unmet:  Not Applicable  Comments: Pt able to follow exercise prescription today without complaint.  Will continue to monitor for progression.    Dr. Emily Filbert is Medical Director for [place] and [facility].

## 2018-04-20 DIAGNOSIS — J449 Chronic obstructive pulmonary disease, unspecified: Secondary | ICD-10-CM

## 2018-04-20 DIAGNOSIS — Z7984 Long term (current) use of oral hypoglycemic drugs: Secondary | ICD-10-CM | POA: Diagnosis not present

## 2018-04-20 DIAGNOSIS — Z79899 Other long term (current) drug therapy: Secondary | ICD-10-CM | POA: Diagnosis not present

## 2018-04-20 DIAGNOSIS — E785 Hyperlipidemia, unspecified: Secondary | ICD-10-CM | POA: Diagnosis not present

## 2018-04-20 DIAGNOSIS — M1990 Unspecified osteoarthritis, unspecified site: Secondary | ICD-10-CM | POA: Diagnosis not present

## 2018-04-20 DIAGNOSIS — E119 Type 2 diabetes mellitus without complications: Secondary | ICD-10-CM | POA: Diagnosis not present

## 2018-04-20 DIAGNOSIS — I10 Essential (primary) hypertension: Secondary | ICD-10-CM | POA: Diagnosis not present

## 2018-04-20 DIAGNOSIS — Z9981 Dependence on supplemental oxygen: Secondary | ICD-10-CM | POA: Diagnosis not present

## 2018-04-20 DIAGNOSIS — Z7982 Long term (current) use of aspirin: Secondary | ICD-10-CM | POA: Diagnosis not present

## 2018-04-20 DIAGNOSIS — I2510 Atherosclerotic heart disease of native coronary artery without angina pectoris: Secondary | ICD-10-CM | POA: Diagnosis not present

## 2018-04-20 NOTE — Progress Notes (Signed)
Daily Session Note  Patient Details  Name: Alfred Carney MRN: [id number] Date of Birth: [birth year] Referring Provider:     Pulmonary Rehab from 01/20/2018 in [facility] Card[facility] and Pulmonary Rehab  Referring Provider  Christinia Gully MD      Encounter Date: 04/20/2018  Check In: Session Check In - 04/20/18 1146      Check-In   Supervising physician immediately available to respond to emergencies  [facility] immediately available ER MD    Physician(s)  Joni Fears and Printmaker    Location  [facility]-Cardiac & Pulmonary Rehab    Staff Present  Justin Mend Jaci Carrel, BS, ACSM CEP, Exercise Physiologist;Iley Deignan Oletta Darter, [place], ACSM CEP, Exercise Physiologist    Medication changes reported      No    Fall or balance concerns reported     No    Warm-up and Cool-down  Performed as group-led instruction    Resistance Training Performed  Yes    VAD Patient?  No    PAD/SET Patient?  No      Pain Assessment   Currently in Pain?  No/denies    Multiple Pain Sites  No          Social History   Tobacco Use  Smoking Status Former Smoker  . Packs/day: 1.00  . Years: 35.00  . Pack years: 35.00  . Types: Cigarettes  . Last attempt to quit: 09/16/2006  . Years since quitting: 11.6  Smokeless Tobacco Never Used    Goals Met:  Proper associated with RPD/PD & O2 Sat Independence with exercise equipment Exercise tolerated well Strength training completed today  Goals Unmet:  Not Applicable  Comments: Pt able to follow exercise prescription today without complaint.  Will continue to monitor for progression.    Dr. Emily Filbert is Medical Director for [place] and [facility].

## 2018-04-22 ENCOUNTER — Encounter: Payer: Medicare HMO | Admitting: *Deleted

## 2018-04-22 DIAGNOSIS — I10 Essential (primary) hypertension: Secondary | ICD-10-CM | POA: Diagnosis not present

## 2018-04-22 DIAGNOSIS — M1990 Unspecified osteoarthritis, unspecified site: Secondary | ICD-10-CM | POA: Diagnosis not present

## 2018-04-22 DIAGNOSIS — I2510 Atherosclerotic heart disease of native coronary artery without angina pectoris: Secondary | ICD-10-CM | POA: Diagnosis not present

## 2018-04-22 DIAGNOSIS — J449 Chronic obstructive pulmonary disease, unspecified: Secondary | ICD-10-CM

## 2018-04-22 DIAGNOSIS — E785 Hyperlipidemia, unspecified: Secondary | ICD-10-CM | POA: Diagnosis not present

## 2018-04-22 DIAGNOSIS — E119 Type 2 diabetes mellitus without complications: Secondary | ICD-10-CM | POA: Diagnosis not present

## 2018-04-22 DIAGNOSIS — Z9981 Dependence on supplemental oxygen: Secondary | ICD-10-CM | POA: Diagnosis not present

## 2018-04-22 DIAGNOSIS — R0602 Shortness of breath: Secondary | ICD-10-CM | POA: Diagnosis not present

## 2018-04-22 DIAGNOSIS — Z7982 Long term (current) use of aspirin: Secondary | ICD-10-CM | POA: Diagnosis not present

## 2018-04-22 DIAGNOSIS — Z7984 Long term (current) use of oral hypoglycemic drugs: Secondary | ICD-10-CM | POA: Diagnosis not present

## 2018-04-22 DIAGNOSIS — Z79899 Other long term (current) drug therapy: Secondary | ICD-10-CM | POA: Diagnosis not present

## 2018-04-22 NOTE — Progress Notes (Signed)
Daily Session Note  Patient Details  Name: Alfred Carney MRN: [id number] Date of Birth: May 13, 1951 Referring Provider:     Pulmonary Rehab from 01/20/2018 in [facility] Cardiac and Pulmonary Rehab  Referring Provider  Alfred Gully MD      Encounter Date: 04/22/2018  Check In: Session Check In - 04/22/18 1132      Check-In   Supervising physician immediately available to respond to emergencies  [facility] immediately available ER MD    Physician(s)  Dr. Clearnce Hasten and [place]    Location  [facility]-Cardiac & Pulmonary Rehab    Staff Present  Justin Mend RCP,RRT,BSRT;Jessica Luan Pulling, [place], RCEP, CCRP, Exercise Physiologist;Meredith Sherryll Burger, RN BSN    Medication changes reported      No    Fall or balance concerns reported     No    Warm-up and Cool-down  Performed as group-led Higher education careers adviser Performed  Yes    VAD Patient?  No    PAD/SET Patient?  No      Pain Assessment   Currently in Pain?  No/denies          Social History   Tobacco Use  Smoking Status Former Smoker  . Packs/day: 1.00  . Years: 35.00  . Pack years: 35.00  . Types: Cigarettes  . Last attempt to quit: 09/16/2006  . Years since quitting: 11.6  Smokeless Tobacco Never Used    Goals Met:  Proper associated with RPD/PD & O2 Sat Independence with exercise equipment Using PLB without cueing & demonstrates good technique Exercise tolerated well No report of cardiac concerns or symptoms Strength training completed today  Goals Unmet:  Not Applicable  Comments: Pt able to follow exercise prescription today without complaint.  Will continue to monitor for progression.    Dr. Emily Filbert is Medical Director for [name] and [facility].

## 2018-04-24 DIAGNOSIS — I2510 Atherosclerotic heart disease of native coronary artery without angina pectoris: Secondary | ICD-10-CM | POA: Diagnosis not present

## 2018-04-24 DIAGNOSIS — I10 Essential (primary) hypertension: Secondary | ICD-10-CM | POA: Diagnosis not present

## 2018-04-24 DIAGNOSIS — Z7982 Long term (current) use of aspirin: Secondary | ICD-10-CM | POA: Diagnosis not present

## 2018-04-24 DIAGNOSIS — M1990 Unspecified osteoarthritis, unspecified site: Secondary | ICD-10-CM | POA: Diagnosis not present

## 2018-04-24 DIAGNOSIS — E785 Hyperlipidemia, unspecified: Secondary | ICD-10-CM | POA: Diagnosis not present

## 2018-04-24 DIAGNOSIS — Z9981 Dependence on supplemental oxygen: Secondary | ICD-10-CM | POA: Diagnosis not present

## 2018-04-24 DIAGNOSIS — E119 Type 2 diabetes mellitus without complications: Secondary | ICD-10-CM | POA: Diagnosis not present

## 2018-04-24 DIAGNOSIS — Z79899 Other long term (current) drug therapy: Secondary | ICD-10-CM | POA: Diagnosis not present

## 2018-04-24 DIAGNOSIS — J449 Chronic obstructive pulmonary disease, unspecified: Secondary | ICD-10-CM

## 2018-04-24 DIAGNOSIS — Z7984 Long term (current) use of oral hypoglycemic drugs: Secondary | ICD-10-CM | POA: Diagnosis not present

## 2018-04-24 NOTE — Progress Notes (Signed)
Daily Session Note  Patient Details  Name: Alfred Carney MRN: [id number] Date of Birth: 12-22-1950 Referring Provider:     Pulmonary Rehab from 01/20/2018 in [facility] Cardiac and Pulmonary Rehab  Referring Provider  Christinia Gully MD      Encounter Date: 04/24/2018  Check In:      Social History   Tobacco Use  Smoking Status Former Smoker  . Packs/day: 1.00  . Years: 35.00  . Pack years: 35.00  . Types: Cigarettes  . Last attempt to quit: 09/16/2006  . Years since quitting: 11.6  Smokeless Tobacco Never Used    Goals Met:  Independence with exercise equipment Exercise tolerated well Personal goals reviewed No report of cardiac concerns or symptoms Strength training completed today  Goals Unmet:  Not Applicable  Comments: Pt able to follow exercise prescription today without complaint.  Will continue to monitor for progression.   Dr. Emily Filbert is Medical Director for [place] and [facility].

## 2018-04-27 DIAGNOSIS — E119 Type 2 diabetes mellitus without complications: Secondary | ICD-10-CM | POA: Diagnosis not present

## 2018-04-27 DIAGNOSIS — Z7984 Long term (current) use of oral hypoglycemic drugs: Secondary | ICD-10-CM | POA: Diagnosis not present

## 2018-04-27 DIAGNOSIS — Z9981 Dependence on supplemental oxygen: Secondary | ICD-10-CM | POA: Diagnosis not present

## 2018-04-27 DIAGNOSIS — E785 Hyperlipidemia, unspecified: Secondary | ICD-10-CM | POA: Diagnosis not present

## 2018-04-27 DIAGNOSIS — M1990 Unspecified osteoarthritis, unspecified site: Secondary | ICD-10-CM | POA: Diagnosis not present

## 2018-04-27 DIAGNOSIS — I2510 Atherosclerotic heart disease of native coronary artery without angina pectoris: Secondary | ICD-10-CM | POA: Diagnosis not present

## 2018-04-27 DIAGNOSIS — Z79899 Other long term (current) drug therapy: Secondary | ICD-10-CM | POA: Diagnosis not present

## 2018-04-27 DIAGNOSIS — J449 Chronic obstructive pulmonary disease, unspecified: Secondary | ICD-10-CM | POA: Diagnosis not present

## 2018-04-27 DIAGNOSIS — Z7982 Long term (current) use of aspirin: Secondary | ICD-10-CM | POA: Diagnosis not present

## 2018-04-27 DIAGNOSIS — I10 Essential (primary) hypertension: Secondary | ICD-10-CM | POA: Diagnosis not present

## 2018-04-27 LAB — GLUCOSE, CAPILLARY: Glucose-Capillary: 125 mg/dL — ABNORMAL HIGH (ref 70–99)

## 2018-04-27 NOTE — Progress Notes (Signed)
Daily Session Note  Patient Details  Name: Alfred Carney MRN: [id number] Date of Birth: 18-Jun-1951 Referring Provider:     Pulmonary Rehab from 01/20/2018 in [facility] Cardiac and Pulmonary Rehab  Referring Provider  Christinia Gully MD      Encounter Date: 04/27/2018  Check In: Session Check In - 04/27/18 1156      Check-In   Supervising physician immediately available to respond to emergencies  [facility] immediately available ER MD    Physician(s)  Dr. Quentin Cornwall and Dr. Corky Downs    Location  [facility]-Cardiac & Pulmonary Rehab    Staff Present  Nyoka Cowden, RN, BSN, [place], BS, ACSM CEP, Exercise Physiologist;Amanda Oletta Darter, [place], ACSM CEP, Exercise Physiologist    Medication changes reported      No    Fall or balance concerns reported     No    Tobacco Cessation  No Change    Warm-up and Cool-down  Performed as group-led instruction    Resistance Training Performed  Yes    VAD Patient?  No    PAD/SET Patient?  No      Pain Assessment   Currently in Pain?  No/denies    Multiple Pain Sites  No          Social History   Tobacco Use  Smoking Status Former Smoker  . Packs/day: 1.00  . Years: 35.00  . Pack years: 35.00  . Types: Cigarettes  . Last attempt to quit: 09/16/2006  . Years since quitting: 11.6  Smokeless Tobacco Never Used    Goals Met:  Proper associated with RPD/PD & O2 Sat Independence with exercise equipment Exercise tolerated well Strength training completed today  Goals Unmet:  Not Applicable  Comments: Alfred Carney fell while in the midst of switching exercise stations.  He stated his "leg gave out".  This has happened in the past.  He did not feel he was injured.  He was able to complete the rest of the session.   Dr. Emily Filbert is Medical Director for [place] and [facility].

## 2018-05-01 ENCOUNTER — Telehealth: Payer: Self-pay

## 2018-05-01 NOTE — Telephone Encounter (Signed)
Alfred Carney foot is hurt  - he feels he injured it when he fell.  He has been icing it at home and it is somewhat better.  He hasn't called Dr. I recommended he go to urgent care to get an evaluation.

## 2018-05-02 ENCOUNTER — Ambulatory Visit (INDEPENDENT_AMBULATORY_CARE_PROVIDER_SITE_OTHER): Payer: Medicare HMO

## 2018-05-02 ENCOUNTER — Ambulatory Visit (HOSPITAL_COMMUNITY)
Admission: EM | Admit: 2018-05-02 | Discharge: 2018-05-02 | Disposition: A | Discharge status: Home / Self Care | Payer: Medicare HMO | Attending: Family Medicine | Admitting: Family Medicine

## 2018-05-02 ENCOUNTER — Other Ambulatory Visit: Payer: Self-pay

## 2018-05-02 ENCOUNTER — Encounter (HOSPITAL_COMMUNITY): Payer: Self-pay | Admitting: Emergency Medicine

## 2018-05-02 DIAGNOSIS — S99922A Unspecified injury of left foot, initial encounter: Secondary | ICD-10-CM

## 2018-05-02 DIAGNOSIS — M25572 Pain in left ankle and joints of left foot: Secondary | ICD-10-CM | POA: Diagnosis not present

## 2018-05-02 DIAGNOSIS — M79672 Pain in left foot: Secondary | ICD-10-CM | POA: Diagnosis not present

## 2018-05-02 DIAGNOSIS — S99912A Unspecified injury of left ankle, initial encounter: Secondary | ICD-10-CM

## 2018-05-02 DIAGNOSIS — M7989 Other specified soft tissue disorders: Secondary | ICD-10-CM | POA: Diagnosis not present

## 2018-05-02 NOTE — ED Provider Notes (Signed)
[facility]   [id number] 05/02/18 Arrival Time: 9518  ASSESSMENT & PLAN:  1. Ankle injury, left, initial encounter   2. Foot injury, left, initial encounter     Imaging: Dg Ankle Complete Left  Result Date: 05/02/2018 CLINICAL DATA:  Acute LEFT ankle pain following fall. EXAM: LEFT ANKLE COMPLETE - 3+ VIEW COMPARISON:  None. FINDINGS: Irregularity of the distal fibula is of uncertain chronicity but may represent an acute fracture. No other acute fracture or dislocation noted. The mid fibula has been resected. Diffuse soft tissue swelling is noted. Degenerative changes at the tibiotalar joint present. IMPRESSION: Distal fibular irregularity, of uncertain chronicity but may represent an acute fracture. Soft tissue swelling. Electronically Signed   By: Margarette Canada M.D.   On: 05/02/2018 11:51   Dg Foot Complete Left  Result Date: 05/02/2018 CLINICAL DATA:  Acute LEFT foot pain following injury 4 days ago. Initial encounter. EXAM: LEFT FOOT - COMPLETE 3+ VIEW COMPARISON:  None. FINDINGS: Irregularity of the distal fibula is of uncertain chronicity. No other fracture, subluxation or dislocation noted. Diffuse soft tissue swelling is noted, greatest dorsally. No radiopaque foreign body noted. Plantar fascia calcifications identified. IMPRESSION: Distal fibular irregularity which is of uncertain chronicity. Acute fracture not excluded. Soft tissue swelling, greatest dorsally. Electronically Signed   By: Margarette Canada M.D.   On: 05/02/2018 11:53   Declines walking boot. OTC Tylenol.  Follow-up Information    Helane Rima, MD.   Specialty:  Family Medicine Why:  As needed. Contact information: [place] [address] [phone]        Schedule an appointment as soon as possible for a visit  with Rod Can, MD.   Specialty:  Orthopedic Surgery Contact information: [address] [place] [address] [place] [address] [phone]            Reviewed expectations re: course of current medical issues. Questions answered. Outlined signs and symptoms indicating need for more acute intervention. Patient verbalized understanding. After Visit Summary given.  SUBJECTIVE: History from: patient. Alfred Carney is a 67 y.o. male who reports persistent mild pain of his left foot/ankle that is stable; described as aching without radiation. Onset: 5 days ago Injury/trama: yes, reports being on a treadmill at rehab; "stopped and I stepped off and my legs gave out"; reports fall to floor; mild L ankle/foot tenderness; able to bear weight; swelling now worse; no bruising; normally has some swelling of bilateral legs Relieved by: rest. Worsened by: certain movements. Associated symptoms: none reported. Extremity sensation changes or weakness: none. Self treatment: has not tried OTCs for relief of pain. History of similar: no  ROS: As per HPI.   OBJECTIVE:  Vitals:   05/02/18 1042  BP: 118/66  Pulse: 64  Resp: 20  Temp: 98.1 F (36.7 C)  TempSrc: Oral  SpO2: 94%    General appearance: alert; no distress Extremities: warm and well perfused; no gross deformities; poorly localized tenderness over his left lateral foot and ankle with marked swelling and no bruising; ROM: baseline but with reported discomfort; 2+ edema over ankle and foot CV: brisk extremity capillary refill Skin: warm and dry Neurologic: normal sensation in all extremities Psychological: alert and cooperative; normal mood and affect  No Known Allergies  Past Medical History:  Diagnosis Date  . Chronic obstructive pulmonary disease ([place]) 2008   Moderate  . Colon polyps   . Community acquired pneumonia   . Coronary artery disease   . Degenerative  joint disease   . Diabetes mellitus without complication ([place])    type 2  . Emphysema lung ([place])   . Enlarged liver    fatty liver by '09 CT  . Gastroesophageal reflux disease   . Gout   . H/O hiatal  hernia   . History of Rocky Mountain spotted fever    Possible history of Rocky [place] Spotted Fever  . Hyperlipidemia   . Hypertension   . Hypokalemia    diuretic induced, resolved  . Microcytic anemia    iron pills  . Morbid obesity ([place])   . Shortness of breath   . Skin cancer    skin cancer lip removed  . Sleep apnea    not currently using CPAP 05/20/13  . 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 Marital status: Married    Spouse name: Not on file  . Number of children: 4  . Years of education: Not on file  . Highest education level: Not on file  Occupational History  . Occupation: architectural drawing-retired  Social Needs  . Financial resource strain: Not on file  . Food insecurity:    Worry: Not on file    Inability: Not on file  . Transportation needs:    Medical: Not on file    Non-medical: Not on file  Tobacco Use  . Smoking status: Former Smoker    Packs/day: 1.00    Years: 35.00    Pack years: 35.00    Types: Cigarettes    Last attempt to quit: 09/16/2006    Years since quitting: 11.6  . Smokeless tobacco: Never Used  Substance and Sexual Activity  . Alcohol use: No    Alcohol/week: 0.0 standard drinks  . Drug use: No  . Sexual activity: Yes  Lifestyle  . Physical activity:    Days per week: Not on file    Minutes per session: Not on file  . Stress: Not on file  Relationships  . Social connections:    Talks on phone: Not on file    Gets together: Not on file    Attends religious service: Not on file    Active member of club or organization: Not on file    Attends meetings of clubs or organizations: Not on file    Relationship status: Not on file  Other Topics Concern  . Not on file  Social History Narrative  . Not on file   Family History  Problem Relation Age of Onset  . Osteoarthritis Mother   . Diabetes Mother   . Heart disease Father        Coronary Artery  Disease  . Multiple sclerosis Sister   . Factor V Leiden deficiency Sister   . Lung cancer Maternal Grandfather        smoked  . Emphysema Sister        smoked   Past Surgical History:  Procedure Laterality Date  . CARDIAC CATHETERIZATION  2007   Ejection fraction is estimated at 60%  . CHOLECYSTECTOMY    . COLONOSCOPY WITH PROPOFOL N/A 01/22/2018   Procedure: COLONOSCOPY WITH PROPOFOL;  Surgeon: Mauri Pole, MD;  Location: [facility];  Service: Endoscopy;  Laterality: N/A;  . ESOPHAGOGASTRODUODENOSCOPY (EGD) WITH PROPOFOL N/A 01/22/2018   Procedure: ESOPHAGOGASTRODUODENOSCOPY (EGD) WITH PROPOFOL;  Surgeon: Mauri Pole, MD;  Location: [facility];  Service: Endoscopy;  Laterality: N/A;  . HARDWARE REMOVAL Left 05/26/2013  Procedure: HARDWARE REMOVAL;  Surgeon: Newt Minion, MD;  Location: [facility];  Service: Orthopedics;  Laterality: Left;  Left Total Hip Arthroplasty, Removal of Deep Hardware  . HIP SURGERY     Status post left hip surgery with bone grafting  . LEFT HEART CATHETERIZATION WITH CORONARY ANGIOGRAM N/A 01/31/2014   Procedure: LEFT HEART CATHETERIZATION WITH CORONARY ANGIOGRAM;  Surgeon: Burnell Blanks, MD;  Location: [facility] CATH LAB;  Service: Cardiovascular;  Laterality: N/A;  . THORACOABDOMINAL AORTIC ANEURYSM REPAIR     with right femoral and left iliac BPG and reimplantation of renal arteries.  . TONSILLECTOMY    . TONSILLECTOMY    . TOTAL HIP ARTHROPLASTY Left 05/26/2013   Procedure: TOTAL HIP ARTHROPLASTY;  Surgeon: Newt Minion, MD;  Location: [facility];  Service: Orthopedics;  Laterality: Left;  Left Total Hip Arthroplasty, Removal Deep Hardware      Vanessa Kick, MD 05/02/18 1226

## 2018-05-02 NOTE — ED Triage Notes (Addendum)
Family member states patient tripped on a treadmill on Monday.   Patient has bilateral pedal edema as baseline, but left lower extremity is more swollen than right and paitnet/family reports it is unusually swollen.    Pedal pulses palpable in left foot

## 2018-05-04 ENCOUNTER — Telehealth: Payer: Self-pay

## 2018-05-04 DIAGNOSIS — J449 Chronic obstructive pulmonary disease, unspecified: Secondary | ICD-10-CM

## 2018-05-04 NOTE — Telephone Encounter (Signed)
Alfred Carney called to say he had an xray on his ankle. He stated that the doctors said they couldn't tell if it was an old injury or a new injury. He is to stay off it for a couple days. He will be back to [facility] once he heals.

## 2018-05-06 ENCOUNTER — Encounter: Payer: Self-pay | Admitting: Internal Medicine

## 2018-05-06 ENCOUNTER — Ambulatory Visit: Payer: Medicare HMO | Admitting: Internal Medicine

## 2018-05-06 ENCOUNTER — Telehealth: Payer: Self-pay

## 2018-05-06 VITALS — BP 128/72 | HR 64 | Ht 68.0 in | Wt 312.0 lb

## 2018-05-06 DIAGNOSIS — R911 Solitary pulmonary nodule: Secondary | ICD-10-CM

## 2018-05-06 DIAGNOSIS — J449 Chronic obstructive pulmonary disease, unspecified: Secondary | ICD-10-CM

## 2018-05-06 DIAGNOSIS — J9611 Chronic respiratory failure with hypoxia: Secondary | ICD-10-CM

## 2018-05-06 NOTE — Telephone Encounter (Signed)
Called patient to follow up after a fall. He states that the doctors could not verify if he had new or old scarring or fracture in his left foot. He still has a bit of pain and is taking it easy. He plans to return to [facility] on Monday.

## 2018-05-06 NOTE — Patient Instructions (Addendum)
The goal is to  Keep the saturations above 90% when you exercise   Have your pcp refer you to orthopedics if needed   Keep the legs elevated at night as much as you can and consider wearing elastic knee highs during the day    Please schedule a follow up visit in 3 months but call sooner if needed - needs ct scheduled prior if possible

## 2018-05-06 NOTE — Progress Notes (Signed)
Subjective:   Patient ID: Alfred Carney, male    DOB: 31-Mar-1951    MRN: [id number]    Brief patient profile:  18  yowm quit smoking 2008 with baseline weight of 240 when he stopped smoking in March of 2008 p pna with breathing only some better then  worse since summer 2015 on so referred 05/17/2015 to pulmonary clinic by Dr Helane Rima for copd eval as had been seen here in 2012 with GOLD II criteria.     History of Present Illness 05/17/2015 1st [facility] office visit/ Avea Mcgowen  / copd eval on ACEi  Chief Complaint  Patient presents with  . Pulmonary Consult    Referre by Dr. Helane Rima. Pt c/o SOB x 10 yrs, gradually worse over the past yr.  He states that he gets SOB with minimal exertion such as taking a shower or walking 49 yrds on flat surface.   indolent onset progressive doe but always ok at rest/  Sleeps in recliner x 10 years Has osa/ could not tol cpap and tends to fall asleep p eats Assoc with sense of nasal and throat congestion but very little cough - does have hb but controls with prn ppi Had been on several different inhalers including according to the records Spiriva but didn't think any of them really helped and stopped them years ago rec Stop lisinopril  Valsartan 80 mg one daily in place of lisinopril Prilosec Take 30- 60 min before your first and last meals of the day just until you return  GERD  Diet     06/28/2015  f/u ov/Shermika Balthaser re: GOLD II copd/ obesity Chief Complaint  Patient presents with  . Follow-up    PFT done today. Pt states his breathing is unchanged. He c/o cough for the past month- prod with min clear sputum.    Was some better until caught cold x 2 weeks p serving jury duty Has not tried sleeping s recliner  rec Stop hydrodiuril (thiazides contribute to gout)  Start lasix 20 mg daily as needed for swelling  - if you cut the sal out you may not need this  Symbicort 160 Take 2 puffs first thing in am and then another 2 puffs about 12  hours later.      08/15/2015  f/u ov/Robinn Overholt re: uacs/ copd GOLD II/ symptoms better on ppi bid ac  Chief Complaint  Patient presents with  . Follow-up    pt following for COPD: pt states hes is doing pretty well since he has last been here. pt c/o some wheezing and SOB with exertion but states it is getting better. no c/o of cough or chest tightness.  pt states he stopped taking the symbicort he felt worse when he was on it.    Really Not limited by breathing from desired activities   rec Weight control is simply a matter of calorie balance   Think of your calorie balance like you do your bank account where in this case you want the balance to go down so you must take in less calories than you burn up.  It's just that simple:  Hard to do, but easy to understand.  Good luck!  You do not have significant limiting  copd and unlikely you ever will - pulmonary follow up is as needed     11/19/2017  Extended f/u ov/Oleva Koo re-establish for  worse sob x 3 y  Chief Complaint  Patient presents with  . Follow-up  SOB that has not gotten any better since last OV .  Has a CPAP  but unable to tolerate .   indolent gradually worsening Dyspnea: 50 ft to car down 6 steps from the deck and struggles to back x 3-4 months Cough: none Sleep: poor/ restlessness/ recliner but barely elevated / has cpap but can't tol at all  SABA use:  Not using one now  Not using prilosec regularly  rec stiolto 2 pffs each am  Wear 02 2lpm 24/7 for now Please remember to go to the lab and x-ray department downstairs in the basement  for your tests - we will call you with the results when they are available.   Please schedule a follow up office visit in 6 weeks, call sooner if needed with all medications /inhalers/ solutions in hand so we can verify exactly what you are taking. This includes all medications from all doctors and over the counters  - full pfts on return    01/05/2018  f/u ov/Walaa Carel re:   COPD II 02 2 lpm - no  better with stiolto so did not fill  / did not bring meds  Chief Complaint  Patient presents with  . Follow-up    PFT's done today.  Breathing is unchanged since the last visit.    Dyspnea:  MMRC3 = can't walk 100 yards even at a slow pace at a flat grade s stopping due to sob   Cough: none Sleep: recliner < 30 degrees due to back and breathing SABA use:  Not helping   Overt hb on ppi bid ac > for EGD by Dr Nyoka Cowden rec Only use your Albuterol as rescue  Please see patient coordinator before you leave today  to schedule pulmonary rehab at [facility]> done        02/02/2018  f/u ov/Blayne Garlick re:  Copd II/ 02 2lpm  Chief Complaint  Patient presents with  . Follow-up    Breathing is about the same. He has good and bad days. He is using his ventolin inhaler 1-2 x per wk on average.    Dyspnea:  MMRC3 = can't walk 100 yards even at a slow pace at a flat grade s stopping due to sob  4 min x  .28 miles /level  Stopped half way due to legs  Cough: none Sleep: recliner due to back > breathing rec Omeprazole 40 mg Take 30-60 min before first meal of the day  Add anoro one click each am - fill it if you feel it improves your activity tolerance especially at rehab       05/06/2018  f/u ov/Elis Sauber re:  Copd II/  2lpm ex up to 4lpm was doing fine with rehab until fell/ now in w/c Chief Complaint  Patient presents with  . Follow-up    c/o stable sob with exertion.    Dyspnea:  15 min on treadmill @ 4lpm @ 0.8 up to 1.0 flat then fell 04/27/18  Cough: no Sleeping: recliner 30 degrees/  SABA use: not really using much, no better on anoro so stopped 02: 2lpm     No obvious day to day or daytime variability or assoc excess/ purulent sputum or mucus plugs or hemoptysis or cp or chest tightness, subjective wheeze or overt sinus or hb symptoms.   Sleeping as above without nocturnal  or early am exacerbation  of respiratory  c/o's or need for noct saba. Also denies any obvious fluctuation of symptoms  with weather or environmental changes  or other aggravating or alleviating factors except as outlined above   No unusual exposure hx or h/o childhood pna/ asthma or knowledge of premature birth.  Current Allergies, Complete Past Medical History, Past Surgical History, Family History, and Social History were reviewed in [employer] record.  ROS  The following are not active complaints unless bolded Hoarseness, sore throat, dysphagia, dental problems, itching, sneezing,  nasal congestion or discharge of excess mucus or purulent secretions, ear ache,   fever, chills, sweats, unintended wt loss or wt gain, classically pleuritic or exertional cp,  orthopnea pnd or arm/hand swelling  or leg swelling, presyncope, palpitations, abdominal pain, anorexia, nausea, vomiting, diarrhea  or change in bowel habits or change in bladder habits, change in stools or change in urine, dysuria, hematuria,  rash, arthralgias, visual complaints, headache, numbness, weakness or ataxia or problems with walking or coordination,  change in mood or  memory.        Current Meds  Medication Sig  . albuterol (PROVENTIL HFA;VENTOLIN HFA) 108 (90 Base) MCG/ACT inhaler Inhale 2 puffs into the lungs every 6 (six) hours as needed for wheezing or shortness of breath.  Marland Kitchen amLODipine (NORVASC) 5 MG tablet Take 5 mg by mouth daily.   . Calcium Carbonate-Simethicone (ALKA-SELTZER HEARTBURN + GAS) 750-80 MG CHEW Chew by mouth as needed.  . Cholecalciferol (VITAMIN D PO) Take 1 capsule by mouth 2 (two) times daily.  . ferrous sulfate 325 (65 FE) MG tablet Take 325 mg by mouth daily with breakfast.   . furosemide (LASIX) 20 MG tablet Take 1 tablet (20 mg total) by mouth daily as needed.  . irbesartan (AVAPRO) 300 MG tablet Take 300 mg by mouth daily.   Marland Kitchen ketoconazole (NIZORAL) 2 % shampoo Apply 1 application topically every other day.   . metFORMIN (GLUCOPHAGE) 500 MG tablet Take 500 mg by mouth 2 (two) times daily with  a meal.   . metoprolol succinate (TOPROL-XL) 25 MG 24 hr tablet Take 25 mg by mouth daily.  . nitroGLYCERIN (NITROSTAT) 0.4 MG SL tablet Place 1 tablet (0.4 mg total) under the tongue every 5 (five) minutes x 3 doses as needed for chest pain.  Marland Kitchen omeprazole (PRILOSEC) 40 MG capsule Take 1 capsule (40 mg total) by mouth daily.  . OXYGEN Inhale 2 L into the lungs continuous.   . rosuvastatin (CRESTOR) 10 MG tablet Take 5 mg by mouth daily.  . tamsulosin (FLOMAX) 0.4 MG CAPS capsule Take 0.4 mg by mouth daily.            Objective:   Physical Exam    W/c bound obese wm nad    06/28/2015        326 > 08/15/2015  323 >  11/19/2017  326 >  01/05/2018 320 > 02/02/2018    324 >  05/06/2018  312     05/17/15 329 lb (149.233 kg)  01/19/15 322 lb (146.058 kg)  01/18/15 319 lb (144.697 kg)     Vital signs reviewed - Note on arrival 02 sats  95% on  2lpm         HEENT: nl   turbinates bilaterally, and oropharynx. Nl external ear canals without cough reflex - full dentures   NECK :  without JVD/Nodes/TM/ nl carotid upstrokes bilaterally   LUNGS: no acc muscle use,  Nl contour chest which is clear to A and P bilaterally without cough on insp or exp maneuvers   CV:  RRR  no s3 or  murmur or increase in P2, and 1-2+ pitting both legs L >R  ABD: tensely obese with limited inspiratory excursion  . No bruits or organomegaly appreciated, bowel sounds nl  MS:    ext warm without deformities, calf tenderness, cyanosis or clubbing No obvious joint restrictions   SKIN: warm and dry without lesions    NEURO:  alert, approp, nl sensorium with  no motor or cerebellar deficits apparent.                    Assessment & Plan:

## 2018-05-07 ENCOUNTER — Encounter: Payer: Self-pay | Admitting: Internal Medicine

## 2018-05-07 NOTE — Assessment & Plan Note (Signed)
11/19/2017  sats 81% at rest in a chronic stable state so rec 2lpm 24/7    rec keep sats > 90% while ex to help improve calorie burning, reviewed

## 2018-05-07 NOTE — Assessment & Plan Note (Signed)
CT chest 01/28/18  New 13 mm nodule at the left lung base    Discussed in detail all the  indications, usual  risks and alternatives  relative to the benefits with patient who agrees to proceed with repeat ct in 3 months - placed in reminder   I had an extended discussion with the patient/wife  reviewing all relevant studies completed to date and  lasting 15 to 20 minutes of a 25 minute visit    Each maintenance medication was reviewed in detail including most importantly the difference between maintenance and prns and under what circumstances the prns are to be triggered using an action plan format that is not reflected in the computer generated alphabetically organized AVS.    Please see AVS for specific instructions unique to this visit that I personally wrote and verbalized to the the pt in detail and then reviewed with pt  by my nurse highlighting any  changes in therapy recommended at today's visit to their plan of care.

## 2018-05-07 NOTE — Assessment & Plan Note (Signed)
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 - 05/17/2015  Walked RA  2 laps @ 185 ft each stopped due to  Sob/ desat at nl pace to 83%   - trial off acei 05/17/2015  - PFT's  06/28/2015  FEV1 1.67 (51 % ) ratio 55  p 13 % improvement from saba with DLCO  47 % corrects to 66 % for alv volume  - rec trial of symbicort 06/28/15 > coughing / breathing worse so stopped it   - 08/15/2015  Walked RA x 3 laps @ 185 ft each stopped due to End of study, nl pace, min sob/  desat  To 88%  - sats 81% at rest and not acutely ill 11/19/2017 > placed on 2lpm (see separate a/p)  - Spirometry 11/19/2017  FEV1 1.60 (49%)  Ratio 59 with classic curvature - 11/19/2017  After extensive coaching inhaler device  effectiveness =    90% with smi > trial of stiolto   PFT's  01/05/2018  FEV1 1.66 (52 % ) ratio 60  p 6 % improvement from saba p 6 prior to study with DLCO  38 % corrects to 56  % for alv volume   - 01/05/2018  Referred to rehab [facility] - Anoro trial 02/02/2018 > not better    His copd is really not as limiting obesity/ conditioning and now not able to ex due to injury so critical he stay as active with upper ext as possible / no change in rx needed

## 2018-05-07 NOTE — Assessment & Plan Note (Addendum)
pfts 06/28/2015 with restrictive component with erv 41%   Body mass index is 47.44 kg/m.  -  trending down / encouraged Lab Results  Component Value Date   TSH 3.74 11/19/2017     Contributing to gerd risk/ doe/reviewed the need and the process to achieve and maintain neg calorie balance > defer f/u primary care including intermittently monitoring thyroid status

## 2018-05-11 DIAGNOSIS — I2510 Atherosclerotic heart disease of native coronary artery without angina pectoris: Secondary | ICD-10-CM | POA: Diagnosis not present

## 2018-05-11 DIAGNOSIS — Z7984 Long term (current) use of oral hypoglycemic drugs: Secondary | ICD-10-CM | POA: Diagnosis not present

## 2018-05-11 DIAGNOSIS — Z7982 Long term (current) use of aspirin: Secondary | ICD-10-CM | POA: Diagnosis not present

## 2018-05-11 DIAGNOSIS — J449 Chronic obstructive pulmonary disease, unspecified: Secondary | ICD-10-CM

## 2018-05-11 DIAGNOSIS — M1990 Unspecified osteoarthritis, unspecified site: Secondary | ICD-10-CM | POA: Diagnosis not present

## 2018-05-11 DIAGNOSIS — E119 Type 2 diabetes mellitus without complications: Secondary | ICD-10-CM | POA: Diagnosis not present

## 2018-05-11 DIAGNOSIS — Z79899 Other long term (current) drug therapy: Secondary | ICD-10-CM | POA: Diagnosis not present

## 2018-05-11 DIAGNOSIS — E785 Hyperlipidemia, unspecified: Secondary | ICD-10-CM | POA: Diagnosis not present

## 2018-05-11 DIAGNOSIS — I10 Essential (primary) hypertension: Secondary | ICD-10-CM | POA: Diagnosis not present

## 2018-05-11 DIAGNOSIS — Z9981 Dependence on supplemental oxygen: Secondary | ICD-10-CM | POA: Diagnosis not present

## 2018-05-11 NOTE — Progress Notes (Signed)
Pulmonary Individual Treatment Plan  Patient Details  Name: Alfred Carney MRN: [id number] Date of Birth: [birth year] Referring Provider:     Pulmonary Rehab from 01/20/2018 in [facility] Cardiac and Pulmonary Rehab  Referring Provider  Christinia Gully MD      Initial Encounter Date:    Pulmonary Rehab from 01/20/2018 in [facility] Cardiac and Pulmonary Rehab  Date  01/20/18      Visit Diagnosis: Chronic obstructive pulmonary disease, unspecified COPD type ([place])  Patient's Home Medications on Admission:  Current Outpatient Medications:  .  albuterol (PROVENTIL HFA;VENTOLIN HFA) 108 (90 Base) MCG/ACT inhaler, Inhale 2 puffs into the lungs every 6 (six) hours as needed for wheezing or shortness of breath., Disp: 1 Inhaler, Rfl: 6 .  amLODipine (NORVASC) 5 MG tablet, Take 5 mg by mouth daily. , Disp: , Rfl:  .  Calcium Carbonate-Simethicone (ALKA-SELTZER HEARTBURN + GAS) 750-80 MG CHEW, Chew by mouth as needed., Disp: , Rfl:  .  Cholecalciferol (VITAMIN D PO), Take 1 capsule by mouth 2 (two) times daily., Disp: , Rfl:  .  ferrous sulfate 325 (65 FE) MG tablet, Take 325 mg by mouth daily with breakfast. , Disp: , Rfl:  .  furosemide (LASIX) 20 MG tablet, Take 1 tablet (20 mg total) by mouth daily as needed., Disp: 30 tablet, Rfl: 11 .  irbesartan (AVAPRO) 300 MG tablet, Take 300 mg by mouth daily. , Disp: , Rfl:  .  ketoconazole (NIZORAL) 2 % shampoo, Apply 1 application topically every other day. , Disp: , Rfl:  .  metFORMIN (GLUCOPHAGE) 500 MG tablet, Take 500 mg by mouth 2 (two) times daily with a meal. , Disp: , Rfl:  .  metoprolol succinate (TOPROL-XL) 25 MG 24 hr tablet, Take 25 mg by mouth daily., Disp: , Rfl:  .  nitroGLYCERIN (NITROSTAT) 0.4 MG SL tablet, Place 1 tablet (0.4 mg total) under the tongue every 5 (five) minutes x 3 doses as needed for chest pain., Disp: 25 tablet, Rfl: 4 .  omeprazole (PRILOSEC) 40 MG capsule, Take 1 capsule (40 mg total) by mouth daily., Disp: 90 capsule, Rfl: 3 .   OXYGEN, Inhale 2 L into the lungs continuous. , Disp: , Rfl:  .  rosuvastatin (CRESTOR) 10 MG tablet, Take 5 mg by mouth daily., Disp: , Rfl:  .  tamsulosin (FLOMAX) 0.4 MG CAPS capsule, Take 0.4 mg by mouth daily. , Disp: , Rfl:   Past Medical History: Past Medical History:  Diagnosis Date  . Chronic obstructive pulmonary disease ([place]) 2008   Moderate  . Colon polyps   . Community acquired pneumonia   . Coronary artery disease   . Degenerative joint disease   . Diabetes mellitus without complication ([place])    type 2  . Emphysema lung ([place])   . Enlarged liver    fatty liver by '09 CT  . Gastroesophageal reflux disease   . Gout   . H/O hiatal hernia   . History of [place] spotted fever    Possible history of [place] Spotted Fever  . Hyperlipidemia   . Hypertension   . Hypokalemia    diuretic induced, resolved  . Microcytic anemia    iron pills  . Morbid obesity ([place])   . Shortness of breath   . Skin cancer    skin cancer lip removed  . Sleep apnea    not currently using CPAP 05/20/13  . 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 Packs/day: 1.00  . Years: 35.00  . Pack years: 35.00  . Types: Cigarettes  . Last attempt to quit: 09/16/2006  . Years since quitting: 11.6  Smokeless Tobacco Never Used    Labs: Recent Review Scientist, physiological    Labs for ITP Cardiac and Pulmonary Rehab Latest Ref Rng & Units 10/15/2011 01/14/2012 01/29/2014 05/08/2014   Cholestrol 0 - 200 mg/dL 167 82 88 -   LDLCALC 0 - 99 mg/dL 104(H) 25 32 -   HDL >39 mg/dL 41.00 36.40(L) 30(L) -   Trlycerides <150 mg/dL 112.0 102.0 130 -   Hemoglobin A1c <5.7 % - - - 6.6(H)       Pulmonary Assessment Scores: Pulmonary Assessment Scores    Row Name 01/20/18 1502 04/24/18 1148       ADL UCSD   ADL Phase  Entry  Exit    SOB Score total  88  70    Rest  1  0    Walk  4  3     Stairs  4  4    Bath  5  3    Dress  3  2    Shop  5  5      CAT Score   CAT Score  23  17      mMRC Score   mMRC Score  4  -       Pulmonary Function Assessment: Pulmonary Function Assessment - 01/20/18 1439      Pulmonary Function Tests   FVC%  64 %    FEV1%  52 %      Breath   Bilateral Breath Sounds  Clear;Decreased    Shortness of Breath  Yes;Limiting activity       Exercise Target Goals: Exercise Program Goal: Individual exercise prescription set using results from initial 6 min walk test and THRR while considering  patient's activity barriers and safety.   Exercise Prescription Goal: Initial exercise prescription builds to 30-45 minutes a day of aerobic activity, 2-3 days per week.  Home exercise guidelines will be given to patient during program as part of exercise prescription that the participant will acknowledge.  Activity Barriers & Risk Stratification: Activity Barriers & Cardiac Risk Stratification - 01/20/18 1531      Activity Barriers & Cardiac Risk Stratification   Activity Barriers  Deconditioning;Muscular Weakness;Balance Concerns;Shortness of Breath;History of Falls;Assistive Device   uses cane for balance      6 Minute Walk: 6 Minute Walk    Row Name 01/20/18 1526         6 Minute Walk   Phase  Initial     Distance  142 feet     Walk Time  1.05 minutes test terminated due to desaturation     # of Rest Breaks  0     MPH  1.54     METS  0.09     RPE  13     Perceived Dyspnea   2     VO2 Peak  0.32     Symptoms  Yes (comment)     Comments  SOB, uses cane for balance     Resting HR  70 bpm     Resting BP  136/64     Resting Oxygen Saturation   90 %     Exercise Oxygen Saturation  during 6 min walk  73 %  Max Ex. HR  100 bpm     Max Ex. BP  168/74     2 Minute Post BP  156/70 140/74       Interval HR   1 Minute HR  100     2 Minute HR  99 test stopped at 1:03     3 Minute HR  91     4 Minute HR  77     6 Minute HR  68      Interval Heart Rate?  Yes       Interval Oxygen   Interval Oxygen?  Yes     Baseline Oxygen Saturation %  90 %     1 Minute Oxygen Saturation %  80 % test terminated at 1:03 73%     1 Minute Liters of Oxygen  2 L continuous     2 Minute Oxygen Saturation %  87 %     2 Minute Liters of Oxygen  2 L     4 Minute Oxygen Saturation %  95 %     4 Minute Liters of Oxygen  2 L     6 Minute Oxygen Saturation %  94 %     6 Minute Liters of Oxygen  2 L       Oxygen Initial Assessment: Oxygen Initial Assessment - 01/20/18 1441      Home Oxygen   Home Oxygen Device  Home Concentrator;E-Tanks    Sleep Oxygen Prescription  CPAP;Continuous    Liters per minute  2    Home Exercise Oxygen Prescription  Continuous    Liters per minute  2    Home at Rest Exercise Oxygen Prescription  Continuous    Liters per minute  2    Compliance with Home Oxygen Use  No   does not wear CPAP     Initial 6 min Walk   Oxygen Used  Continuous    Liters per minute  2      Program Oxygen Prescription   Program Oxygen Prescription  Continuous    Liters per minute  2      Intervention   Short Term Goals  To learn and understand importance of monitoring SPO2 with pulse oximeter and demonstrate accurate use of the pulse oximeter.;To learn and demonstrate proper pursed lip breathing techniques or other breathing techniques.;To learn and demonstrate proper use of respiratory medications;To learn and understand importance of maintaining oxygen saturations>88%;To learn and exhibit compliance with exercise, home and travel O2 prescription    Long  Term Goals  Exhibits compliance with exercise, home and travel O2 prescription;Verbalizes importance of monitoring SPO2 with pulse oximeter and return demonstration;Maintenance of O2 saturations>88%;Exhibits proper breathing techniques, such as pursed lip breathing or other method taught during program session;Compliance with respiratory medication;Demonstrates proper use of MDI's        Oxygen Re-Evaluation: Oxygen Re-Evaluation    Row Name 02/27/18 1149 04/03/18 1241 04/15/18 1414 04/24/18 1206       Program Oxygen Prescription   Program Oxygen Prescription  Continuous  Continuous  Continuous  Continuous;E-Tanks    Liters per minute  4  4  4  3     Comments  4 liters for exercise  4 liters for exercise  4 liters for exercise  -      Home Oxygen   Home Oxygen Device  Home Concentrator;E-Tanks  Home Concentrator;E-Tanks  Home Concentrator;E-Tanks  Home Concentrator;E-Tanks    Sleep Oxygen Prescription  CPAP;Continuous  CPAP;Continuous  CPAP;Continuous  CPAP;Continuous    Liters per minute  2  2  2  2     Home Exercise Oxygen Prescription  Continuous  Continuous  Continuous  Continuous    Liters per minute  2  2  2  2     Home at Rest Exercise Oxygen Prescription  Continuous  Continuous  Continuous  Continuous    Liters per minute  2  2  -  -    Compliance with Home Oxygen Use  No he states he cannot tolerate that.  No  No He does not use his oxygen in the car or at the store.  No does not use oxygen at home as much, nor does he use his CPAP      Goals/Expected Outcomes   Short Term Goals  To learn and understand importance of monitoring SPO2 with pulse oximeter and demonstrate accurate use of the pulse oximeter.;To learn and demonstrate proper pursed lip breathing techniques or other breathing techniques.;To learn and demonstrate proper use of respiratory medications;To learn and understand importance of maintaining oxygen saturations>88%;To learn and exhibit compliance with exercise, home and travel O2 prescription  To learn and understand importance of monitoring SPO2 with pulse oximeter and demonstrate accurate use of the pulse oximeter.;To learn and demonstrate proper pursed lip breathing techniques or other breathing techniques.;To learn and demonstrate proper use of respiratory medications;To learn and understand importance of maintaining oxygen saturations>88%;To  learn and exhibit compliance with exercise, home and travel O2 prescription  To learn and understand importance of monitoring SPO2 with pulse oximeter and demonstrate accurate use of the pulse oximeter.;To learn and demonstrate proper pursed lip breathing techniques or other breathing techniques.;To learn and demonstrate proper use of respiratory medications;To learn and understand importance of maintaining oxygen saturations>88%;To learn and exhibit compliance with exercise, home and travel O2 prescription  To learn and understand importance of monitoring SPO2 with pulse oximeter and demonstrate accurate use of the pulse oximeter.;To learn and demonstrate proper pursed lip breathing techniques or other breathing techniques.;To learn and demonstrate proper use of respiratory medications;To learn and understand importance of maintaining oxygen saturations>88%;To learn and exhibit compliance with exercise, home and travel O2 prescription    Long  Term Goals  Exhibits compliance with exercise, home and travel O2 prescription;Verbalizes importance of monitoring SPO2 with pulse oximeter and return demonstration;Maintenance of O2 saturations>88%;Exhibits proper breathing techniques, such as pursed lip breathing or other method taught during program session;Compliance with respiratory medication;Demonstrates proper use of MDI's  Exhibits compliance with exercise, home and travel O2 prescription;Verbalizes importance of monitoring SPO2 with pulse oximeter and return demonstration;Maintenance of O2 saturations>88%;Exhibits proper breathing techniques, such as pursed lip breathing or other method taught during program session;Compliance with respiratory medication;Demonstrates proper use of MDI's  Exhibits compliance with exercise, home and travel O2 prescription;Verbalizes importance of monitoring SPO2 with pulse oximeter and return demonstration;Maintenance of O2 saturations>88%;Exhibits proper breathing techniques,  such as pursed lip breathing or other method taught during program session;Compliance with respiratory medication;Demonstrates proper use of MDI's  Exhibits compliance with exercise, home and travel O2 prescription;Verbalizes importance of monitoring SPO2 with pulse oximeter and return demonstration;Maintenance of O2 saturations>88%;Exhibits proper breathing techniques, such as pursed lip breathing or other method taught during program session;Compliance with respiratory medication;Demonstrates proper use of MDI's    Comments  Alfred Carney does not take nebulizers and has a albuterol inhaler if needed. He needs to work on PLB at home a little more. He checks his oxygen often at home and tries to keep  it above 88 percent.  Alfred Carney really wants to work on coming off of his oxygen. He does not use it in the car and sats have maintained above 90%.  He is going to start trying to walk down to rehab and back to car afterwards.  He is going to start with 2L.  Alfred Carney is going to try to use 3 liters of oxyen on his machines instread of 4 liters. He states he does not always use his oxygen in the car and at the store. Informed patient to use his oxygen when he is out and about to decrease risk of his oxygen getting too low.  Alfred Carney has been getting a little better with oxygen.  He does not use it at home, like he should, but does keep an eye on his saturations.     Goals/Expected Outcomes  Short: work on PLB while on the treadmill. Long: Be independent with PLB  Short: Continue to use PLB and walk into rehab.  Long: Continue to work on coming off oxygen.   Short: decrease oxygen to 3 liters. Long: decrease oxygen to 2 liters on all exercises.  Short: Continue to work on using 3 liters.  Long: Continue to use PLB to manage breathing.        Oxygen Discharge (Final Oxygen Re-Evaluation): Oxygen Re-Evaluation - 04/24/18 1206      Program Oxygen Prescription   Program Oxygen Prescription  Continuous;E-Tanks    Liters per minute  3       Home Oxygen   Home Oxygen Device  Home Concentrator;E-Tanks    Sleep Oxygen Prescription  CPAP;Continuous    Liters per minute  2    Home Exercise Oxygen Prescription  Continuous    Liters per minute  2    Home at Rest Exercise Oxygen Prescription  Continuous    Compliance with Home Oxygen Use  No   does not use oxygen at home as much, nor does he use his CPAP     Goals/Expected Outcomes   Short Term Goals  To learn and understand importance of monitoring SPO2 with pulse oximeter and demonstrate accurate use of the pulse oximeter.;To learn and demonstrate proper pursed lip breathing techniques or other breathing techniques.;To learn and demonstrate proper use of respiratory medications;To learn and understand importance of maintaining oxygen saturations>88%;To learn and exhibit compliance with exercise, home and travel O2 prescription    Long  Term Goals  Exhibits compliance with exercise, home and travel O2 prescription;Verbalizes importance of monitoring SPO2 with pulse oximeter and return demonstration;Maintenance of O2 saturations>88%;Exhibits proper breathing techniques, such as pursed lip breathing or other method taught during program session;Compliance with respiratory medication;Demonstrates proper use of MDI's    Comments  Alfred Carney has been getting a little better with oxygen.  He does not use it at home, like he should, but does keep an eye on his saturations.     Goals/Expected Outcomes  Short: Continue to work on using 3 liters.  Long: Continue to use PLB to manage breathing.        Initial Exercise Prescription: Initial Exercise Prescription - 01/20/18 1500      Date of Initial Exercise RX and Referring Provider   Date  01/20/18    Referring Provider  Christinia Gully MD      Oxygen   Oxygen  Continuous    Liters  4      Treadmill   MPH  0.8    Grade  0    Minutes  15    METs  1.6      T5 Nustep   Level  1    SPM  80    Minutes  15    METs  1.5       Biostep-RELP   Level  1    SPM  40    Minutes  15    METs  2      Prescription Details   Frequency (times per week)  3    Duration  Progress to 45 minutes of aerobic exercise without signs/symptoms of physical distress      Intensity   THRR 40-80% of Max Heartrate  103-136    Ratings of Perceived Exertion  11-13    Perceived Dyspnea  0-4      Progression   Progression  Continue to progress workloads to maintain intensity without signs/symptoms of physical distress.      Resistance Training   Training Prescription  Yes    Weight  3 lbs    Reps  10-15       Perform Capillary Blood Glucose checks as needed.  Exercise Prescription Changes: Exercise Prescription Changes    Row Name 01/20/18 1500 02/03/18 1400 02/19/18 1400 02/25/18 1200 03/03/18 1400     Response to Exercise   Blood Pressure (Admit)  136/64  134/84  124/62  -  140/80   Blood Pressure (Exercise)  168/74  160/76  -  -  -   Blood Pressure (Exit)  140/74  124/74  102/62  -  128/68   Heart Rate (Admit)  70 bpm  67 bpm  65 bpm  -  62 bpm   Heart Rate (Exercise)  100 bpm  112 bpm  94 bpm  -  97 bpm   Heart Rate (Exit)  68 bpm  70 bpm  78 bpm  -  82 bpm   Oxygen Saturation (Admit)  90 %  95 %  93 %  -  95 %   Oxygen Saturation (Exercise)  73 %  88 %  90 %  -  89 %   Oxygen Saturation (Exit)  94 %  94 %  94 %  -  93 %   Rating of Perceived Exertion (Exercise)  13  15  16   -  15   Perceived Dyspnea (Exercise)  2  3  4   -  3   Symptoms  SOB  SOB  SOB  -  SOB on treadmill   Comments  walk test results, uses cane  second full day of exercise  -  -  -   Duration  -  Progress to 45 minutes of aerobic exercise without signs/symptoms of physical distress  Continue with 45 min of aerobic exercise without signs/symptoms of physical distress.  -  Continue with 45 min of aerobic exercise without signs/symptoms of physical distress.   Intensity  -  THRR unchanged  THRR unchanged  -  THRR unchanged     Progression   Progression   -  Continue to progress workloads to maintain intensity without signs/symptoms of physical distress.  Continue to progress workloads to maintain intensity without signs/symptoms of physical distress.  -  Continue to progress workloads to maintain intensity without signs/symptoms of physical distress.   Average METs  -  1.83  1.8  -  2.23     Resistance Training   Training Prescription  -  Yes  Yes  -  Yes   Weight  -  3 lbs  3 lb  -  4 lbs   Reps  -  10-15  10-15  -  10-15     Interval Training   Interval Training  -  No  No  -  No     Oxygen   Oxygen  -  Continuous  Continuous  -  Continuous   Liters  -  4  4  -  4     Treadmill   MPH  -  0.8  0.8  -  0.8   Grade  -  0  0  -  0   Minutes  -  6 4 min, 2 min  6 6/3/5  -  15   METs  -  1.6  1.6  -  1.6     T5 Nustep   Level  -  1  -  -  3   SPM  -  78  -  -  76   Minutes  -  15  -  -  15   METs  -  1.9  -  -  2.1     Biostep-RELP   Level  -  1  2  -  4   SPM  -  44  42  -  44   Minutes  -  15  15  -  15   METs  -  2  2  -  3     Home Exercise Plan   Plans to continue exercise at  -  -  -  Home (comment) walking, chair exercises  Home (comment) walking, chair exercises   Frequency  -  -  -  Add 1 additional day to program exercise sessions.  Add 1 additional day to program exercise sessions.   Initial Home Exercises Provided  -  -  -  02/25/18  02/25/18   Row Name 03/17/18 1500 03/31/18 1500 04/14/18 1500 04/28/18 1400       Response to Exercise   Blood Pressure (Admit)  128/70  128/70  130/70  128/78    Blood Pressure (Exit)  132/70  124/74  136/62  100/62    Heart Rate (Admit)  64 bpm  79 bpm  76 bpm  69 bpm    Heart Rate (Exercise)  89 bpm  90 bpm  95 bpm  99 bpm    Heart Rate (Exit)  74 bpm  84 bpm  74 bpm  72 bpm    Oxygen Saturation (Admit)  96 %  95 %  95 %  94 %    Oxygen Saturation (Exercise)  88 %  88 %  89 %  89 %    Oxygen Saturation (Exit)  95 %  95 %  94 %  94 %    Rating of Perceived Exertion (Exercise)   14  13  14  13     Perceived Dyspnea (Exercise)  3  2  2  3     Symptoms  SOB on treadmill  SOB on treadmill  SOB on treadmill  none    Duration  Continue with 45 min of aerobic exercise without signs/symptoms of physical distress.  Continue with 45 min of aerobic exercise without signs/symptoms of physical distress.  Continue with 45 min of aerobic exercise without signs/symptoms of physical distress.  Continue with 45 min of aerobic exercise without signs/symptoms of physical distress.    Intensity  THRR unchanged  THRR unchanged  THRR unchanged  THRR unchanged      Progression   Progression  Continue to progress workloads to maintain intensity without signs/symptoms of physical distress.  Continue to progress workloads to maintain intensity without signs/symptoms of physical distress.  Continue to progress workloads to maintain intensity without signs/symptoms of physical distress.  Continue to progress workloads to maintain intensity without signs/symptoms of physical distress.    Average METs  2.27  2.3  2.36  2.36      Resistance Training   Training Prescription  Yes  Yes  Yes  Yes    Weight  4 lbs  4 lbs  4 lbs  4 lbs    Reps  10-15  10-15  10-15  10-15      Interval Training   Interval Training  No  No  No  No      Oxygen   Oxygen  Continuous  Continuous  Continuous  Continuous    Liters  4  4  4  3       Treadmill   MPH  0.8  0.8  1  1     Grade  0  0  0  0    Minutes  15  15  15  15     METs  1.6  1.6  1.77  1.77      T5 Nustep   Level  3  4  4  4     SPM  72  -  -  -    Minutes  15  15  15  15     METs  2.2  2.3  2.3  2.3      Biostep-RELP   Level  6  7  7  7     SPM  40  -  -  -    Minutes  15  15  15  15     METs  3  3  3  3       Home Exercise Plan   Plans to continue exercise at  Home (comment) walking, chair exercises  Home (comment) walking, chair exercises  Home (comment) walking, chair exercises  Home (comment) walking, chair exercises    Frequency  Add 2 additional  days to program exercise sessions.  Add 2 additional days to program exercise sessions.  Add 2 additional days to program exercise sessions.  Add 2 additional days to program exercise sessions.    Initial Home Exercises Provided  02/25/18  02/25/18  02/25/18  02/25/18       Exercise Comments: Exercise Comments    Row Name 01/26/18 1146           Exercise Comments  First full day of exercise!  Patient was oriented to gym and equipment including functions, settings, policies, and procedures.  Patient's individual exercise prescription and treatment plan were reviewed.  All starting workloads were established based on the results of the 6 minute walk test done at initial orientation visit.  The plan for exercise progression was also introduced and progression will be customized based on patient's performance and goals.          Exercise Goals and Review: Exercise Goals    Row Name 01/20/18 1539             Exercise Goals   Increase Physical Activity  Yes       Intervention  Provide advice, education, support and counseling about physical activity/exercise needs.;Develop an individualized exercise prescription for aerobic and resistive training based on initial evaluation  findings, risk stratification, comorbidities and participant's personal goals.       Expected Outcomes  Short Term: Attend rehab on a regular basis to increase amount of physical activity.;Long Term: Add in home exercise to make exercise part of routine and to increase amount of physical activity.;Long Term: Exercising regularly at least 3-5 days a week.       Increase Strength and Stamina  Yes       Intervention  Provide advice, education, support and counseling about physical activity/exercise needs.;Develop an individualized exercise prescription for aerobic and resistive training based on initial evaluation findings, risk stratification, comorbidities and participant's personal goals.       Expected Outcomes  Short  Term: Increase workloads from initial exercise prescription for resistance, speed, and METs.;Short Term: Perform resistance training exercises routinely during rehab and add in resistance training at home;Long Term: Improve cardiorespiratory fitness, muscular endurance and strength as measured by increased METs and functional capacity (6MWT)       Able to understand and use rate of perceived exertion (RPE) scale  Yes       Intervention  Provide education and explanation on how to use RPE scale       Expected Outcomes  Short Term: Able to use RPE daily in rehab to express subjective intensity level;Long Term:  Able to use RPE to guide intensity level when exercising independently       Able to understand and use Dyspnea scale  Yes       Intervention  Provide education and explanation on how to use Dyspnea scale       Expected Outcomes  Short Term: Able to use Dyspnea scale daily in rehab to express subjective sense of shortness of breath during exertion;Long Term: Able to use Dyspnea scale to guide intensity level when exercising independently       Knowledge and understanding of Target Heart Rate Range (THRR)  Yes       Intervention  Provide education and explanation of THRR including how the numbers were predicted and where they are located for reference       Expected Outcomes  Short Term: Able to state/look up THRR;Short Term: Able to use daily as guideline for intensity in rehab;Long Term: Able to use THRR to govern intensity when exercising independently       Able to check pulse independently  Yes       Intervention  Provide education and demonstration on how to check pulse in carotid and radial arteries.;Review the importance of being able to check your own pulse for safety during independent exercise       Expected Outcomes  Short Term: Able to explain why pulse checking is important during independent exercise;Long Term: Able to check pulse independently and accurately       Understanding of  Exercise Prescription  Yes       Intervention  Provide education, explanation, and written materials on patient's individual exercise prescription       Expected Outcomes  Short Term: Able to explain program exercise prescription;Long Term: Able to explain home exercise prescription to exercise independently          Exercise Goals Re-Evaluation : Exercise Goals Re-Evaluation    Row Name 01/26/18 1146 02/03/18 1439 02/19/18 1434 02/25/18 1217 03/03/18 1447     Exercise Goal Re-Evaluation   Exercise Goals Review  Increase Physical Activity;Able to understand and use Dyspnea scale;Increase Strength and Stamina;Knowledge and understanding of Target Heart Rate Range (THRR);Able to understand and  use rate of perceived exertion (RPE) scale  Increase Physical Activity;Understanding of Exercise Prescription;Increase Strength and Stamina  Increase Physical Activity;Able to understand and use rate of perceived exertion (RPE) scale;Able to understand and use Dyspnea scale  Increase Physical Activity;Able to understand and use rate of perceived exertion (RPE) scale;Able to understand and use Dyspnea scale;Knowledge and understanding of Target Heart Rate Range (THRR);Understanding of Exercise Prescription;Increase Strength and Stamina;Able to check pulse independently  Increase Physical Activity;Understanding of Exercise Prescription;Increase Strength and Stamina   Comments  Reviewed RPE scale, THR and program prescription with pt today.  Pt voiced understanding and was given a copy of goals to take home.   Alfred Carney is off to a good start in rehab.  He has only attend two full days of exercise due to other medical appointments he had scheduled. He will do well if he is able to come consistently to class each day.  We will continue to monitor his progress.   Pt has increased total walk without break from 4 to 6 minutes.  Staff will monitor progress.  Reviewed home exercise with pt today.  Pt plans to walk and do chair  exercises at home for exercise.  He will start with one extra day a week.  Reviewed THR, pulse, RPE, sign and symptoms, NTG use, and when to call 911 or MD.  Also discussed weather considerations and indoor options.  Pt voiced understanding.  Alfred Carney has been doing well in rehab.  He is now up to 4 lbs weights.  He is only averaging about two days a week and would benefit from coming all three days consistently.  He has also increased workloads on the BioStep and T5 NuStep.  We will continue to monitor his progression.    Expected Outcomes  Short: Use RPE daily to regulate intensity.  Long: Follow program prescription in THR.  Short: Attend rehab regularly.  Long: Continue to follow program prescription.   Short - pt will walk 8-10 min without stopping Long - Pt will walk 15 min without stopping  Short: Add in at least one extra day a week at home.  Long: Continue to exercise independently.   Short: Attend class regularly.  Long: Continue to exercise more at home.    [place] Name 03/17/18 1522 03/31/18 1502 04/03/18 1234 04/14/18 1508 04/24/18 1154     Exercise Goal Re-Evaluation   Exercise Goals Review  Increase Physical Activity;Understanding of Exercise Prescription;Increase Strength and Stamina  Increase Physical Activity;Understanding of Exercise Prescription;Increase Strength and Stamina  Increase Physical Activity;Understanding of Exercise Prescription;Increase Strength and Stamina  Increase Physical Activity;Understanding of Exercise Prescription;Increase Strength and Stamina  Increase Physical Activity;Understanding of Exercise Prescription;Increase Strength and Stamina   Comments  Alfred Carney continues to do well in rehab.  He is now up to 5lbs weights and doing level 6 on the BioStep  His attendance has gotten better. We will continue to monitor his progress.   Alfred Carney has been doing well in rehab.  He now up to level 7 on the BioStep!  He also continues to lose weight. We will continue to monitor his progress.    Alfred Carney has been doing well in rehab. He is losing weight!!  He has been doing weights at home and walking some.  He is getting in two extra days a week at home for 20-30 min.  Try start walk more at home. We talked about a goal being to walk down here and out by the time he graduates.  Alfred Carney is doing well in rehab.  He is now walking down the hall to class and back to the car without stopping!!!  He will continue to try to walk down and back without getting as SOB.  We will continue to monitor his progress.   Alfred Carney is doing well in rehab.  He is walking down hall still and he and his wife will be joining [employer] after graduation.  He has been doing some walking at home.  There is a treadmill in the shed that he wants to get out.  He has also been working with handweights at home.    Expected Outcomes  Short: Continue to increase workloads.  Long: Continue to exercise on his own.   Short: Work on walking down to rehab.  Long: Continue to exercise on off days.   Short: Work on walking down to rehab.  Long: Continue to exercise on off days.   Short: Continue to work on walking down and back to class.  Long: Continue to exercise for weight loss.   Short: Continue to work on walking.  Improve post 6MWT next week!  Long: Continue to exercise to work on weight loss.    [place] Name 04/28/18 1423             Exercise Goal Re-Evaluation   Exercise Goals Review  Increase Physical Activity;Understanding of Exercise Prescription;Increase Strength and Stamina       Comments  Alfred Carney continues to do well in rehab.  We will be doing his post walk this week and expect good improvements.  We will continue to monitor his progresss.        Expected Outcomes  Short: Improve post 6MWT.  Long: Continue to work on weight loss.           Discharge Exercise Prescription (Final Exercise Prescription Changes): Exercise Prescription Changes - 04/28/18 1400      Response to Exercise   Blood Pressure (Admit)  128/78    Blood  Pressure (Exit)  100/62    Heart Rate (Admit)  69 bpm    Heart Rate (Exercise)  99 bpm    Heart Rate (Exit)  72 bpm    Oxygen Saturation (Admit)  94 %    Oxygen Saturation (Exercise)  89 %    Oxygen Saturation (Exit)  94 %    Rating of Perceived Exertion (Exercise)  13    Perceived Dyspnea (Exercise)  3    Symptoms  none    Duration  Continue with 45 min of aerobic exercise without signs/symptoms of physical distress.    Intensity  THRR unchanged      Progression   Progression  Continue to progress workloads to maintain intensity without signs/symptoms of physical distress.    Average METs  2.36      Resistance Training   Training Prescription  Yes    Weight  4 lbs    Reps  10-15      Interval Training   Interval Training  No      Oxygen   Oxygen  Continuous    Liters  3      Treadmill   MPH  1    Grade  0    Minutes  15    METs  1.77      T5 Nustep   Level  4    Minutes  15    METs  2.3      Biostep-RELP   Level  7  Minutes  15    METs  3      Home Exercise Plan   Plans to continue exercise at  Home (comment)   walking, chair exercises   Frequency  Add 2 additional days to program exercise sessions.    Initial Home Exercises Provided  02/25/18       Nutrition:  Target Goals: Understanding of nutrition guidelines, daily intake of sodium <1580m, cholesterol <2052m calories 30% from fat and 7% or less from saturated fats, daily to have 5 or more servings of fruits and vegetables.  Biometrics: Pre Biometrics - 01/20/18 1540      Pre Biometrics   Height  5' 9"  (1.753 m)    Weight  (!) 318 lb 14.4 oz (144.7 kg)    Waist Circumference  56.5 inches    Hip Circumference  46.5 inches    Waist to Hip Ratio  1.22 %    BMI (Calculated)  47.07    Single Leg Stand  1.93 seconds        Nutrition Therapy Plan and Nutrition Goals: Nutrition Therapy & Goals - 03/04/18 1055      Nutrition Therapy   Diet  DM/ TLC    Drug/Food Interactions  Purine/Gout     Protein (specify units)  13oz    Fiber  30 grams    Whole Grain Foods  3 servings    Saturated Fats  16 max. grams    Fruits and Vegetables  5 servings/day   8 ideal. eats 2 meals per day currently   Sodium  1500 grams      Personal Nutrition Goals   Nutrition Goal  Increase fluid intake throughout the day, aiming for 48-64oz total to help with fluid retention and thinning mucus   Currently drinks 2 bottles of water per day plus 1-3 diet sodas   Personal Goal #2  Continue to choose lower sodium options for snack foods. When eating out, use the guidelines provided to help you order foods that are lower in sodium and fat    Personal Goal #3  Consider eating dinner at home once per week, slowly progressing to eating more meals at home over the course of the week which will help better control parameters like sodium, fat, portion sizes and total calories    Comments  He and his wife currently eat out most if not all nights of the week. He does not eat lunch but he does try to limit dietary sources of added sugar and sodium. He has also been working to reduce his portion sizes and to choose more vegetables at supper      Intervention Plan   Intervention  Nutrition handout(s) given to patient.;Prescribe, educate and counsel regarding individualized specific dietary modifications aiming towards targeted core components such as weight, hypertension, lipid management, diabetes, heart failure and other comorbidities.   COPD handout, Eating out handout, Low sodium nutrition therapy handout   Expected Outcomes  Short Term Goal: Understand basic principles of dietary content, such as calories, fat, sodium, cholesterol and nutrients.;Short Term Goal: A plan has been developed with personal nutrition goals set during dietitian appointment.;Long Term Goal: Adherence to prescribed nutrition plan.       Nutrition Assessments: Nutrition Assessments - 04/24/18 1151      MEDFICTS Scores   Post Score  90        Nutrition Goals Re-Evaluation: Nutrition Goals Re-Evaluation    Row Name 02/27/18 1154 03/04/18 1104 04/24/18 1200  Goals   Current Weight  317 lb (143.8 kg)  -  312 lb (141.5 kg)     Nutrition Goal  Alfred Carney want sto lose 100 pounds total. Try not to overeat.  Consider eating dinner at home once per week, slowly progressing to eating more meals at home over the course of the week which will help better control parameters like sodium, fat, portion sizes and total calories  Consider eating dinner at home once per week, slowly progressing to eating more meals at home over the course of the week which will help better control parameters like sodium, fat, portion sizes and total calories     Comment  Alfred Carney has lost 5 pounds and wants to lose at least 20 pounds before the program is over.  He and his wife currently eat out most if not all nights of the week. They order a variety of meals, some which are high in sodium and fat  He is eating two meals a week at home.  Brandun has tried to slow down his eating.  They have started to take some his meals home versus eating the full portion when they go out.   He is eating more vegetables and more apples and oranges.      Expected Outcome  Short: lose 5 pounds within the next week. Long: lose 20 pounds by the end of [facility]  He will eat supper at home at least one additional meal per week  Short: Try to cook at home for 2-3 meals  Long: Continue to work on weight loss.        Personal Goal #2 Re-Evaluation   Personal Goal #2  -  Increase fluid intake throughout the day, aiming for 48-64oz total to help with fluid retention and thinning mucus  -       Personal Goal #3 Re-Evaluation   Personal Goal #3  -  Continue to choose lower sodium options for snack foods. When eating out, use the guidelines provided to help you order foods that are lower in sodium and fat  -        Nutrition Goals Discharge (Final Nutrition Goals Re-Evaluation): Nutrition Goals  Re-Evaluation - 04/24/18 1200      Goals   Current Weight  312 lb (141.5 kg)    Nutrition Goal  Consider eating dinner at home once per week, slowly progressing to eating more meals at home over the course of the week which will help better control parameters like sodium, fat, portion sizes and total calories    Comment  He is eating two meals a week at home.  Alfred Carney has tried to slow down his eating.  They have started to take some his meals home versus eating the full portion when they go out.   He is eating more vegetables and more apples and oranges.     Expected Outcome  Short: Try to cook at home for 2-3 meals  Long: Continue to work on weight loss.        Psychosocial: Target Goals: Acknowledge presence or absence of significant depression and/or stress, maximize coping skills, provide positive support system. Participant is able to verbalize types and ability to use techniques and skills needed for reducing stress and depression.   Initial Review & Psychosocial Screening: Initial Psych Review & Screening - 01/20/18 1437      Initial Review   Current issues with  Current Sleep Concerns;Current Stress Concerns    Source of Stress Concerns  Chronic  Illness;Unable to perform yard/household activities    Comments  COPD is most stressful issue      [place]?  Yes    Comments  He can look to his wife and children for support.      Barriers   Psychosocial barriers to participate in program  The patient should benefit from training in stress management and relaxation.      Screening Interventions   Interventions  Encouraged to exercise;Program counselor consult;To provide support and resources with identified psychosocial needs;Provide feedback about the scores to participant    Expected Outcomes  Short Term goal: Utilizing psychosocial counselor, staff and physician to assist with identification of specific Stressors or current issues interfering with healing  process. Setting desired goal for each stressor or current issue identified.;Long Term Goal: Stressors or current issues are controlled or eliminated.;Short Term goal: Identification and review with participant of any Quality of Life or Depression concerns found by scoring the questionnaire.;Long Term goal: The participant improves quality of Life and PHQ9 Scores as seen by post scores and/or verbalization of changes       Quality of Life Scores:  Scores of 19 and below usually indicate a poorer quality of life in these areas.  A difference of  2-3 points is a clinically meaningful difference.  A difference of 2-3 points in the total score of the Quality of Life Index has been associated with significant improvement in overall quality of life, self-image, physical symptoms, and general health in studies assessing change in quality of life.  PHQ-9: Recent Review Flowsheet Data    Depression screen [facility] 2/9 01/20/2018 01/19/2015 07/19/2014 05/31/2014 05/31/2014   Decreased Interest 1 0 0 0 0   Down, Depressed, Hopeless 0 0 0 0 0   PHQ - 2 Score 1 0 0 0 0   Altered sleeping 1 - - - -   Tired, decreased energy 2 - - - -   Change in appetite 0 - - - -   Feeling bad or failure about yourself  1 - - - -   Trouble concentrating 2 - - - -   Moving slowly or fidgety/restless 0 - - - -   Suicidal thoughts 0 - - - -   PHQ-9 Score 7 - - - -   Difficult doing work/chores Somewhat difficult - - - -     Interpretation of Total Score  Total Score Depression Severity:  1-4 = Minimal depression, 5-9 = Mild depression, 10-14 = Moderate depression, 15-19 = Moderately severe depression, 20-27 = Severe depression   Psychosocial Evaluation and Intervention: Psychosocial Evaluation - 03/25/18 1230      Psychosocial Evaluation & Interventions   Interventions  Encouraged to exercise with the program and follow exercise prescription    Comments  Counselor met with Alfred Carney) today for initial psychosocial  evaluation.  He is a 67 year old who struggles with COPD.  Kanaan has a strong support system with a spouse of almost 34 years; (4) adult children and a brother and dad all in [place].  He reports sleeping in a recliner most nights and gets maybe 7-8 hours of sleep.  Zavier has a good appetite.  He has multiple health issues in addition to his pulmonary diagnosis; including diabetes; high blood pressure and cholesterol as well as obesity.  Augustino reports having been diagnosed with depression at one point in his life, and took medications briefly - but denies any current symptoms  and states he is typically in a positive mood.  Tristan states his health is his primary stressor.  He has goals to lose weight; get off the oxygen and breathe better overall.   Constantin has already noticed progress in these goals with loss of a few pounds and not having to sit down to catch his breath as often while doing normal activities.  Counselor commended Jovanni for his progress made.  Staff will follow.    Expected Outcomes  Short:  Alfred Carney will meet with the dietician to address his weight loss goals.   Long:  Bennie will continue to make positive self-care choices in his diet and exercise long term.      Continue Psychosocial Services   Follow up required by staff       Psychosocial Re-Evaluation: Psychosocial Re-Evaluation    [place] Name 02/27/18 1158 04/24/18 1157           Psychosocial Re-Evaluation   Current issues with  Current Sleep Concerns;Current Stress Concerns  Current Sleep Concerns;Current Stress Concerns      Comments  Alfred Carney has been sleeping better since he has started exercising. He cannot tolerate his CPAP so he does not wear it. He is only stressed with his COPD and not being able to breath at times.   Alfred Carney has been doing well mentally.  He continues to sleep better and his snoring has gotten better as he is losing weight.  He continues to sleep in the recliner.  He and his wife's health continue to be his biggest stressors as  he doesn't  try to let things get to him.  His wife stresses enough for the both of them.       Expected Outcomes  Short: attend [facility] regularly to improve stress. Long: maintain exercise to keep stress at a minimum.  Short: Continue to stay postive.  Long: Continue to exercise to lose weight.       Interventions  Encouraged to attend Pulmonary Rehabilitation for the exercise  Encouraged to attend Pulmonary Rehabilitation for the exercise      Continue Psychosocial Services   Follow up required by staff  Follow up required by staff        Initial Review   Source of Stress Concerns  -  Chronic Illness;Unable to perform yard/household activities         Psychosocial Discharge (Final Psychosocial Re-Evaluation): Psychosocial Re-Evaluation - 04/24/18 1157      Psychosocial Re-Evaluation   Current issues with  Current Sleep Concerns;Current Stress Concerns    Comments  Alfred Carney has been doing well mentally.  He continues to sleep better and his snoring has gotten better as he is losing weight.  He continues to sleep in the recliner.  He and his wife's health continue to be his biggest stressors as he doesn't  try to let things get to him.  His wife stresses enough for the both of them.     Expected Outcomes  Short: Continue to stay postive.  Long: Continue to exercise to lose weight.     Interventions  Encouraged to attend Pulmonary Rehabilitation for the exercise    Continue Psychosocial Services   Follow up required by staff      Initial Review   Source of Stress Concerns  Chronic Illness;Unable to perform yard/household activities       Education: Education Goals: Education classes will be provided on a weekly basis, covering required topics. Participant will state understanding/return demonstration of topics presented.  Learning Barriers/Preferences: Learning Barriers/Preferences - 01/20/18 1441      Learning Barriers/Preferences   Learning Barriers  Hearing    Learning Preferences   None       Education Topics:  Initial Evaluation Education: - Verbal, written and demonstration of respiratory meds, oximetry and breathing techniques. Instruction on use of nebulizers and MDIs and importance of monitoring MDI activations.   Pulmonary Rehab from 04/22/2018 in [facility] Cardiac and Pulmonary Rehab  Date  01/20/18  Educator  [facility]  Instruction Review Code  1- Verbalizes Understanding      General Nutrition Guidelines/Fats and Fiber: -Group instruction provided by verbal, written material, models and posters to present the general guidelines for heart healthy nutrition. Gives an explanation and review of dietary fats and fiber.   Pulmonary Rehab from 04/22/2018 in [facility] Cardiac and Pulmonary Rehab  Date  03/30/18  Educator  CR  Instruction Review Code  1- Verbalizes Understanding      Controlling Sodium/Reading Food Labels: -Group verbal and written material supporting the discussion of sodium use in heart healthy nutrition. Review and explanation with models, verbal and written materials for utilization of the food label.   Pulmonary Rehab from 04/22/2018 in [facility] Cardiac and Pulmonary Rehab  Date  04/06/18  Educator  CR  Instruction Review Code  1- Verbalizes Understanding      Exercise Physiology & General Exercise Guidelines: - Group verbal and written instruction with models to review the exercise physiology of the cardiovascular system and associated critical values. Provides general exercise guidelines with specific guidelines to those with heart or lung disease.    Pulmonary Rehab from 04/22/2018 in [facility] Cardiac and Pulmonary Rehab  Date  03/06/18  Educator  [facility]  Instruction Review Code  1- Verbalizes Understanding      Aerobic Exercise & Resistance Training: - Gives group verbal and written instruction on the various components of exercise. Focuses on aerobic and resistive training programs and the benefits of this training and how to safely progress through these  programs.   Flexibility, Balance, Mind/Body Relaxation: Provides group verbal/written instruction on the benefits of flexibility and balance training, including mind/body exercise modes such as yoga, pilates and tai chi.  Demonstration and skill practice provided.   Pulmonary Rehab from 04/22/2018 in [facility] Cardiac and Pulmonary Rehab  Date  04/17/18  Educator  AS  Instruction Review Code  1- Verbalizes Understanding      Stress and Anxiety: - Provides group verbal and written instruction about the health risks of elevated stress and causes of high stress.  Discuss the correlation between heart/lung disease and anxiety and treatment options. Review healthy ways to manage with stress and anxiety.   Pulmonary Rehab from 04/22/2018 in [facility] Cardiac and Pulmonary Rehab  Date  04/22/18  Educator  [facility]  Instruction Review Code  1- Verbalizes Understanding      Depression: - Provides group verbal and written instruction on the correlation between heart/lung disease and depressed mood, treatment options, and the stigmas associated with seeking treatment.   Pulmonary Rehab from 04/22/2018 in [facility] Cardiac and Pulmonary Rehab  Date  03/25/18  Educator  [facility]  Instruction Review Code  1- Verbalizes Understanding      Exercise & Equipment Safety: - Individual verbal instruction and demonstration of equipment use and safety with use of the equipment.   Pulmonary Rehab from 04/22/2018 in [facility] Cardiac and Pulmonary Rehab  Date  01/20/18  Educator  [facility]  Instruction Review Code  1- Verbalizes Understanding  Infection Prevention: - Provides verbal and written material to individual with discussion of infection control including proper hand washing and proper equipment cleaning during exercise session.   Pulmonary Rehab from 04/22/2018 in [facility] Cardiac and Pulmonary Rehab  Date  01/20/18  Educator  [facility]  Instruction Review Code  1- Verbalizes Understanding      Falls Prevention: - Provides verbal and  written material to individual with discussion of falls prevention and safety.   Pulmonary Rehab from 04/22/2018 in [facility] Cardiac and Pulmonary Rehab  Date  01/20/18  Educator  [facility]  Instruction Review Code  1- Verbalizes Understanding      Diabetes: - Individual verbal and written instruction to review signs/symptoms of diabetes, desired ranges of glucose level fasting, after meals and with exercise. Advice that pre and post exercise glucose checks will be done for 3 sessions at entry of program.   Chronic Lung Diseases: - Group verbal and written instruction to review updates, respiratory medications, advancements in procedures and treatments. Discuss use of supplemental oxygen including available portable oxygen systems, continuous and intermittent flow rates, concentrators, personal use and safety guidelines. Review proper use of inhaler and spacers. Provide informative websites for self-education.    Pulmonary Rehab from 04/22/2018 in [facility] Cardiac and Pulmonary Rehab  Date  03/04/18  Educator  [facility]  Instruction Review Code  1- Verbalizes Understanding      Energy Conservation: - Provide group verbal and written instruction for methods to conserve energy, plan and organize activities. Instruct on pacing techniques, use of adaptive equipment and posture/positioning to relieve shortness of breath.   Pulmonary Rehab from 04/22/2018 in [facility] Cardiac and Pulmonary Rehab  Date  02/04/18  Educator  [facility]  Instruction Review Code  1- Verbalizes Understanding      Triggers and Exacerbations: - Group verbal and written instruction to review types of environmental triggers and ways to prevent exacerbations. Discuss weather changes, air quality and the benefits of nasal washing. Review warning signs and symptoms to help prevent infections. Discuss techniques for effective airway clearance, coughing, and vibrations.   Pulmonary Rehab from 04/22/2018 in [place] Cardiac and Pulmonary Rehab  Date  03/20/18   Educator  [facility]  Instruction Review Code  1- Verbalizes Understanding      AED/CPR: - Group verbal and written instruction with the use of models to demonstrate the basic use of the AED with the basic ABC's of resuscitation.   Anatomy and Physiology of the Lungs: - Group verbal and written instruction with the use of models to provide basic lung anatomy and physiology related to function, structure and complications of lung disease.   Pulmonary Rehab from 04/22/2018 in [facility] Cardiac and Pulmonary Rehab  Date  02/18/18  Educator  [facility]  Instruction Review Code  1- Verbalizes Understanding      Anatomy & Physiology of the Heart: - Group verbal and written instruction and models provide basic cardiac anatomy and physiology, with the coronary electrical and arterial systems. Review of Valvular disease and Heart Failure   Pulmonary Rehab from 04/22/2018 in [facility] Cardiac and Pulmonary Rehab  Date  04/15/18  Educator  [facility]  Instruction Review Code  1- Verbalizes Understanding      Cardiac Medications: - Group verbal and written instruction to review commonly prescribed medications for heart disease. Reviews the medication, class of the drug, and side effects.   Know Your Numbers and Risk Factors: -Group verbal and written instruction about important numbers in your health.  Discussion of what are risk  factors and how they play a role in the disease process.  Review of Cholesterol, Blood Pressure, Diabetes, and BMI and the role they play in your overall health.   Pulmonary Rehab from 04/22/2018 in [facility] Cardiac and Pulmonary Rehab  Date  03/18/18  Educator  [facility]  Instruction Review Code  1- Verbalizes Understanding      Sleep Hygiene: -Provides group verbal and written instruction about how sleep can affect your health.  Define sleep hygiene, discuss sleep cycles and impact of sleep habits. Review good sleep hygiene tips.    Pulmonary Rehab from 04/22/2018 in [facility] Cardiac and Pulmonary Rehab  Date   03/11/18  Educator  [facility]  Instruction Review Code  1- Verbalizes Understanding      Other: -Provides group and verbal instruction on various topics (see comments)    Knowledge Questionnaire Score: Knowledge Questionnaire Score - 04/24/18 1149      Knowledge Questionnaire Score   Pre Score  13/18    Post Score  18/18   reviewed with patient        Core Components/Risk Factors/Patient Goals at Admission: Personal Goals and Risk Factors at Admission - 01/20/18 1443      Core Components/Risk Factors/Patient Goals on Admission    Weight Management  Yes;Obesity;Weight Loss    Intervention  Weight Management: Develop a combined nutrition and exercise program designed to reach desired caloric intake, while maintaining appropriate intake of nutrient and fiber, sodium and fats, and appropriate energy expenditure required for the weight goal.;Weight Management: Provide education and appropriate resources to help participant work on and attain dietary goals.;Weight Management/Obesity: Establish reasonable short term and long term weight goals.    Admit Weight  318 lb 14.4 oz (144.7 kg)    Goal Weight: Short Term  313 lb (142 kg)    Goal Weight: Long Term  200 lb (90.7 kg)    Expected Outcomes  Short Term: Continue to assess and modify interventions until short term weight is achieved;Long Term: Adherence to nutrition and physical activity/exercise program aimed toward attainment of established weight goal;Weight Maintenance: Understanding of the daily nutrition guidelines, which includes 25-35% calories from fat, 7% or less cal from saturated fats, less than 265m cholesterol, less than 1.5gm of sodium, & 5 or more servings of fruits and vegetables daily;Weight Loss: Understanding of general recommendations for a balanced deficit meal plan, which promotes 1-2 lb weight loss per week and includes a negative energy balance of [phone] kcal/d;Understanding recommendations for meals to include 15-35%  energy as protein, 25-35% energy from fat, 35-60% energy from carbohydrates, less than 2014mof dietary cholesterol, 20-35 gm of total fiber daily;Understanding of distribution of calorie intake throughout the day with the consumption of 4-5 meals/snacks    Improve shortness of breath with ADL's  Yes    Intervention  Provide education, individualized exercise plan and daily activity instruction to help decrease symptoms of SOB with activities of daily living.    Expected Outcomes  Short Term: Improve cardiorespiratory fitness to achieve a reduction of symptoms when performing ADLs;Long Term: Be able to perform more ADLs without symptoms or delay the onset of symptoms    Diabetes  Yes    Intervention  Provide education about signs/symptoms and action to take for hypo/hyperglycemia.;Provide education about proper nutrition, including hydration, and aerobic/resistive exercise prescription along with prescribed medications to achieve blood glucose in normal ranges: Fasting glucose 65-99 mg/dL    Expected Outcomes  Short Term: Participant verbalizes understanding of the signs/symptoms and immediate  care of hyper/hypoglycemia, proper foot care and importance of medication, aerobic/resistive exercise and nutrition plan for blood glucose control.;Long Term: Attainment of HbA1C < 7%.    Heart Failure  Yes    Intervention  Provide a combined exercise and nutrition program that is supplemented with education, support and counseling about heart failure. Directed toward relieving symptoms such as shortness of breath, decreased exercise tolerance, and extremity edema.    Expected Outcomes  Improve functional capacity of life;Short term: Attendance in program 2-3 days a week with increased exercise capacity. Reported lower sodium intake. Reported increased fruit and vegetable intake. Reports medication compliance.;Long term: Adoption of self-care skills and reduction of barriers for early signs and symptoms recognition  and intervention leading to self-care maintenance.;Short term: Daily weights obtained and reported for increase. Utilizing diuretic protocols set by physician.    Hypertension  Yes    Intervention  Provide education on lifestyle modifcations including regular physical activity/exercise, weight management, moderate sodium restriction and increased consumption of fresh fruit, vegetables, and low fat dairy, alcohol moderation, and smoking cessation.;Monitor prescription use compliance.    Expected Outcomes  Short Term: Continued assessment and intervention until BP is < 140/73m HG in hypertensive participants. < 130/876mHG in hypertensive participants with diabetes, heart failure or chronic kidney disease.;Long Term: Maintenance of blood pressure at goal levels.    Lipids  Yes    Intervention  Provide education and support for participant on nutrition & aerobic/resistive exercise along with prescribed medications to achieve LDL <7015mHDL >68m53m  Expected Outcomes  Long Term: Cholesterol controlled with medications as prescribed, with individualized exercise RX and with personalized nutrition plan. Value goals: LDL < 70mg71mL > 40 mg.;Short Term: Participant states understanding of desired cholesterol values and is compliant with medications prescribed. Participant is following exercise prescription and nutrition guidelines.       Core Components/Risk Factors/Patient Goals Review:  Goals and Risk Factor Review    Row Name 02/27/18 1144 04/24/18 1205           Core Components/Risk Factors/Patient Goals Review   Personal Goals Review  Weight Management/Obesity;Diabetes;Hypertension;Lipids;Heart Failure;Improve shortness of breath with ADL's  Weight Management/Obesity;Diabetes;Hypertension;Lipids;Heart Failure;Improve shortness of breath with ADL's      Review  Alfred Carney 5 pounds since starting the program. He wants to continue to lose weight. His blood pressure has been stable and within  normal limits. He takes blood pressure medication twice a day. He has not had his lipids checked in about six months. He is due for a check up on his lipids soon. Alfred Carney can walk to the car now without getting too short of breath.   Alfred Carney continues to lose weight.  We talked about getting in more home exercise and adding in intervals on seated equipment to help with weight loss.  He does not check his blood sugars at home.  He is doing well with his blood pressures.  He does not have any symptoms of his heart failure.        Expected Outcomes  Short: work on weight loss and get lipids checked. Long: lose 20 pounds.  Short: Continue to work on weight loss.  Long: Continue to monitor risk factors.          Core Components/Risk Factors/Patient Goals at Discharge (Final Review):  Goals and Risk Factor Review - 04/24/18 1205      Core Components/Risk Factors/Patient Goals Review   Personal Goals Review  Weight Management/Obesity;Diabetes;Hypertension;Lipids;Heart Failure;Improve shortness of breath  with ADL's    Review  Alfred Carney continues to lose weight.  We talked about getting in more home exercise and adding in intervals on seated equipment to help with weight loss.  He does not check his blood sugars at home.  He is doing well with his blood pressures.  He does not have any symptoms of his heart failure.      Expected Outcomes  Short: Continue to work on weight loss.  Long: Continue to monitor risk factors.        ITP Comments: ITP Comments    Row Name 01/20/18 1414 02/16/18 0840 03/16/18 1527 04/13/18 0904 04/27/18 1519   ITP Comments  Medical Evaluation completed. Chart sent for review and changes to Dr. Emily Filbert Director of [place]. Diagnosis can be found in [facility] encounter 01/05/18   30 day review completed. ITP sent to Dr. Emily Filbert Director of [place]. Continue with ITP unless changes are made by physician   30 day review completed. ITP sent to Dr. Emily Filbert Director of [place]. Continue  with ITP unless changes are made by physician   30 day review completed. ITP sent to Dr. Emily Filbert Director of [place]. Continue with ITP unless changes are made by physician  Alfred Carney fell while in the midst of switching exercise stations.  He stated his "leg gave out".  This has happened in the past.  He did not feel he was injured.  He was able to complete the rest of the session.   [place] Name 05/01/18 1153 05/04/18 0925 05/06/18 1421 05/11/18 0839     ITP Comments  Johns foot is hurt  - he feels he injured it when he fell.  He has been icing it at home and it is somewhat better.  He hasn't called Dr. I recommended he go to urgent care to get an evaluation.  [facility] called to say he had an xray on his ankle. He stated that the doctors said they couldn't tell if it was an old injury or a new injury. He is to stay off it for a couple days. He will be back to [facility] once he heals.  Called patient to follow up after a fall. He states that the doctors could not verify if he had new or old scarring or fracture in his left foot. He still has a bit of pain and is taking it easy. He plans to return to [facility] on Monday.  30 day review completed. ITP sent to Dr. Emily Filbert Director of [place]. Continue with ITP unless changes are made by physician       Comments: 30 day review

## 2018-05-11 NOTE — Progress Notes (Signed)
Daily Session Note  Patient Details  Name: Alfred Carney MRN: [id number] Date of Birth: 12/04/1950 Referring Provider:     Pulmonary Rehab from 01/20/2018 in [facility] Cardiac and Pulmonary Rehab  Referring Provider  Wert, Michael MD      Encounter Date: 05/11/2018  Check In:      Social History   Tobacco Use  Smoking Status Former Smoker  . Packs/day: 1.00  . Years: 35.00  . Pack years: 35.00  . Types: Cigarettes  . Last attempt to quit: 09/16/2006  . Years since quitting: 11.6  Smokeless Tobacco Never Used    Goals Met:  Proper associated with RPD/PD & O2 Sat Independence with exercise equipment Exercise tolerated well Strength training completed today  Goals Unmet:  Not Applicable  Comments: Pt able to follow exercise prescription today without complaint.  Will continue to monitor for progression.    Dr. Mark Miller is Medical Director for [facility] and [facility]. 

## 2018-05-13 ENCOUNTER — Encounter: Payer: Medicare HMO | Admitting: *Deleted

## 2018-05-13 VITALS — Ht 69.0 in | Wt 313.2 lb

## 2018-05-13 DIAGNOSIS — E785 Hyperlipidemia, unspecified: Secondary | ICD-10-CM | POA: Diagnosis not present

## 2018-05-13 DIAGNOSIS — Z9981 Dependence on supplemental oxygen: Secondary | ICD-10-CM | POA: Diagnosis not present

## 2018-05-13 DIAGNOSIS — Z7984 Long term (current) use of oral hypoglycemic drugs: Secondary | ICD-10-CM | POA: Diagnosis not present

## 2018-05-13 DIAGNOSIS — J449 Chronic obstructive pulmonary disease, unspecified: Secondary | ICD-10-CM

## 2018-05-13 DIAGNOSIS — I10 Essential (primary) hypertension: Secondary | ICD-10-CM | POA: Diagnosis not present

## 2018-05-13 DIAGNOSIS — Z7982 Long term (current) use of aspirin: Secondary | ICD-10-CM | POA: Diagnosis not present

## 2018-05-13 DIAGNOSIS — I2510 Atherosclerotic heart disease of native coronary artery without angina pectoris: Secondary | ICD-10-CM | POA: Diagnosis not present

## 2018-05-13 DIAGNOSIS — E119 Type 2 diabetes mellitus without complications: Secondary | ICD-10-CM | POA: Diagnosis not present

## 2018-05-13 DIAGNOSIS — M1990 Unspecified osteoarthritis, unspecified site: Secondary | ICD-10-CM | POA: Diagnosis not present

## 2018-05-13 DIAGNOSIS — Z79899 Other long term (current) drug therapy: Secondary | ICD-10-CM | POA: Diagnosis not present

## 2018-05-13 NOTE — Progress Notes (Signed)
Daily Session Note  Patient Details  Name: Alfred Carney MRN: [id number] Date of Birth: 27-Sep-1950 Referring Provider:     Pulmonary Rehab from 01/20/2018 in [facility] Cardiac and Pulmonary Rehab  Referring Provider  Christinia Gully MD      Encounter Date: 05/13/2018  Check In: Session Check In - 05/13/18 1124      Check-In   Supervising physician immediately available to respond to emergencies  [facility] immediately available ER MD    Physician(s)  Drs. Domenic Polite    Location  [facility]-Cardiac & Pulmonary Rehab    Staff Present  Darel Hong, RN BSN;Arietta Eisenstein Luan Pulling, [place], RCEP, CCRP, Exercise Physiologist;Joseph Tessie Fass RCP,RRT,BSRT    Medication changes reported      No    Fall or balance concerns reported     No    Warm-up and Cool-down  Performed as group-led instruction    Resistance Training Performed  Yes    VAD Patient?  No    PAD/SET Patient?  No      Pain Assessment   Currently in Pain?  No/denies          Social History   Tobacco Use  Smoking Status Former Smoker  . Packs/day: 1.00  . Years: 35.00  . Pack years: 35.00  . Types: Cigarettes  . Last attempt to quit: 09/16/2006  . Years since quitting: 11.6  Smokeless Tobacco Never Used    Goals Met:  Proper associated with RPD/PD & O2 Sat Independence with exercise equipment Using PLB without cueing & demonstrates good technique Exercise tolerated well No report of cardiac concerns or symptoms Strength training completed today  Goals Unmet:  Not Applicable  Comments: Pt able to follow exercise prescription today without complaint.  Will continue to monitor for progression.  [place] Name 01/20/18 1526 05/13/18 1213       6 Minute Walk   Phase  Initial  Discharge    Distance  142 feet  660 feet    Distance % Change  -  364.8 %    Distance Feet Change  -  518 ft    Walk Time  1.05 minutes test terminated due to desaturation  4.5 minutes    # of Rest Breaks  0  1 1:30    MPH  1.54   1.67    METS  0.09  0.82    RPE  13  15    Perceived Dyspnea   2  3.5    VO2 Peak  0.32  2.87    Symptoms  Yes (comment)  Yes (comment)    Comments  SOB, uses cane for balance  SOB, using cane    Resting HR  70 bpm  60 bpm    Resting BP  136/64  122/66    Resting Oxygen Saturation   90 %  90 %    Exercise Oxygen Saturation  during 6 min walk  73 %  81 %    Max Ex. HR  100 bpm  97 bpm    Max Ex. BP  168/74  142/64    2 Minute Post BP  156/70 140/74  134/66      Interval HR   1 Minute HR  100  83    2 Minute HR  99 test stopped at 1:03  -    3 Minute HR  91  86    4 Minute HR  77  95    5 Minute  HR  -  97    6 Minute HR  68  96    2 Minute Post HR  -  86    Interval Heart Rate?  Yes  Yes      Interval Oxygen   Interval Oxygen?  Yes  Yes    Baseline Oxygen Saturation %  90 %  90 %    1 Minute Oxygen Saturation %  80 % test terminated at 1:03 73%  89 %    1 Minute Liters of Oxygen  2 L continuous  2 L continuos    2 Minute Oxygen Saturation %  87 %  89 %    2 Minute Liters of Oxygen  2 L  2 L    3 Minute Oxygen Saturation %  -  82 % rest break 3:16-4:31    3 Minute Liters of Oxygen  -  2 L    4 Minute Oxygen Saturation %  95 %  84 %    4 Minute Liters of Oxygen  2 L  2 L    5 Minute Oxygen Saturation %  -  85 %    5 Minute Liters of Oxygen  -  2 L    6 Minute Oxygen Saturation %  94 %  81 %    6 Minute Liters of Oxygen  2 L  2 L    2 Minute Post Oxygen Saturation %  -  95 %    2 Minute Post Liters of Oxygen  -  2 L        Dr. Emily Filbert is Medical Director for [place] and [facility].

## 2018-05-14 DIAGNOSIS — I716 Thoracoabdominal aortic aneurysm, without rupture: Secondary | ICD-10-CM | POA: Diagnosis not present

## 2018-05-14 DIAGNOSIS — I83018 Varicose veins of right lower extremity with ulcer other part of lower leg: Secondary | ICD-10-CM | POA: Diagnosis not present

## 2018-05-14 DIAGNOSIS — I10 Essential (primary) hypertension: Secondary | ICD-10-CM | POA: Diagnosis not present

## 2018-05-14 DIAGNOSIS — E1159 Type 2 diabetes mellitus with other circulatory complications: Secondary | ICD-10-CM | POA: Diagnosis not present

## 2018-05-14 DIAGNOSIS — Z0000 Encounter for general adult medical examination without abnormal findings: Secondary | ICD-10-CM | POA: Diagnosis not present

## 2018-05-14 DIAGNOSIS — J9611 Chronic respiratory failure with hypoxia: Secondary | ICD-10-CM | POA: Diagnosis not present

## 2018-05-14 DIAGNOSIS — E1169 Type 2 diabetes mellitus with other specified complication: Secondary | ICD-10-CM | POA: Diagnosis not present

## 2018-05-14 DIAGNOSIS — L97811 Non-pressure chronic ulcer of other part of right lower leg limited to breakdown of skin: Secondary | ICD-10-CM | POA: Diagnosis not present

## 2018-05-15 DIAGNOSIS — J449 Chronic obstructive pulmonary disease, unspecified: Secondary | ICD-10-CM | POA: Diagnosis not present

## 2018-05-15 DIAGNOSIS — M1990 Unspecified osteoarthritis, unspecified site: Secondary | ICD-10-CM | POA: Diagnosis not present

## 2018-05-15 DIAGNOSIS — I2510 Atherosclerotic heart disease of native coronary artery without angina pectoris: Secondary | ICD-10-CM | POA: Diagnosis not present

## 2018-05-15 DIAGNOSIS — Z79899 Other long term (current) drug therapy: Secondary | ICD-10-CM | POA: Diagnosis not present

## 2018-05-15 DIAGNOSIS — E119 Type 2 diabetes mellitus without complications: Secondary | ICD-10-CM | POA: Diagnosis not present

## 2018-05-15 DIAGNOSIS — Z7982 Long term (current) use of aspirin: Secondary | ICD-10-CM | POA: Diagnosis not present

## 2018-05-15 DIAGNOSIS — I10 Essential (primary) hypertension: Secondary | ICD-10-CM | POA: Diagnosis not present

## 2018-05-15 DIAGNOSIS — E785 Hyperlipidemia, unspecified: Secondary | ICD-10-CM | POA: Diagnosis not present

## 2018-05-15 DIAGNOSIS — Z9981 Dependence on supplemental oxygen: Secondary | ICD-10-CM | POA: Diagnosis not present

## 2018-05-15 DIAGNOSIS — Z7984 Long term (current) use of oral hypoglycemic drugs: Secondary | ICD-10-CM | POA: Diagnosis not present

## 2018-05-15 NOTE — Progress Notes (Signed)
Daily Session Note  Patient Details  Name: Alfred Carney MRN: [id number] Date of Birth: 03-Jan-1951 Referring Provider:     Pulmonary Rehab from 01/20/2018 in [facility] Cardiac and Pulmonary Rehab  Referring Provider  Christinia Gully MD      Encounter Date: 05/15/2018  Check In: Session Check In - 05/15/18 1203      Check-In   Supervising physician immediately available to respond to emergencies  [facility] immediately available ER MD    Physician(s)   Dr. Alfred Levins and [facility]    Location  [facility]-Cardiac & Pulmonary Rehab    Staff Present  Justin Mend Lorre Nick, [place], RCEP, CCRP, Exercise Physiologist    Medication changes reported      No    Fall or balance concerns reported     No    Warm-up and Cool-down  Performed as group-led instruction    Resistance Training Performed  Yes    VAD Patient?  No      Pain Assessment   Currently in Pain?  No/denies          Social History   Tobacco Use  Smoking Status Former Smoker  . Packs/day: 1.00  . Years: 35.00  . Pack years: 35.00  . Types: Cigarettes  . Last attempt to quit: 09/16/2006  . Years since quitting: 11.6  Smokeless Tobacco Never Used    Goals Met:  Independence with exercise equipment Exercise tolerated well No report of cardiac concerns or symptoms Strength training completed today  Goals Unmet:  Not Applicable  Comments: Pt able to follow exercise prescription today without complaint.  Will continue to monitor for progression.   Dr. Emily Filbert is Medical Director for [place] and [facility].

## 2018-05-20 ENCOUNTER — Encounter: Payer: Medicare HMO | Attending: Internal Medicine | Admitting: *Deleted

## 2018-05-20 DIAGNOSIS — Z8679 Personal history of other diseases of the circulatory system: Secondary | ICD-10-CM | POA: Insufficient documentation

## 2018-05-20 DIAGNOSIS — D509 Iron deficiency anemia, unspecified: Secondary | ICD-10-CM | POA: Diagnosis not present

## 2018-05-20 DIAGNOSIS — Z7984 Long term (current) use of oral hypoglycemic drugs: Secondary | ICD-10-CM | POA: Insufficient documentation

## 2018-05-20 DIAGNOSIS — K219 Gastro-esophageal reflux disease without esophagitis: Secondary | ICD-10-CM | POA: Insufficient documentation

## 2018-05-20 DIAGNOSIS — J449 Chronic obstructive pulmonary disease, unspecified: Secondary | ICD-10-CM | POA: Diagnosis not present

## 2018-05-20 DIAGNOSIS — Z9981 Dependence on supplemental oxygen: Secondary | ICD-10-CM | POA: Insufficient documentation

## 2018-05-20 DIAGNOSIS — M1990 Unspecified osteoarthritis, unspecified site: Secondary | ICD-10-CM | POA: Diagnosis not present

## 2018-05-20 DIAGNOSIS — Z85828 Personal history of other malignant neoplasm of skin: Secondary | ICD-10-CM | POA: Diagnosis not present

## 2018-05-20 DIAGNOSIS — E119 Type 2 diabetes mellitus without complications: Secondary | ICD-10-CM | POA: Diagnosis not present

## 2018-05-20 DIAGNOSIS — Z87891 Personal history of nicotine dependence: Secondary | ICD-10-CM | POA: Diagnosis not present

## 2018-05-20 DIAGNOSIS — I10 Essential (primary) hypertension: Secondary | ICD-10-CM | POA: Insufficient documentation

## 2018-05-20 DIAGNOSIS — Z7982 Long term (current) use of aspirin: Secondary | ICD-10-CM | POA: Insufficient documentation

## 2018-05-20 DIAGNOSIS — E785 Hyperlipidemia, unspecified: Secondary | ICD-10-CM | POA: Insufficient documentation

## 2018-05-20 DIAGNOSIS — Z6841 Body Mass Index (BMI) 40.0 and over, adult: Secondary | ICD-10-CM | POA: Insufficient documentation

## 2018-05-20 DIAGNOSIS — I2510 Atherosclerotic heart disease of native coronary artery without angina pectoris: Secondary | ICD-10-CM | POA: Diagnosis not present

## 2018-05-20 DIAGNOSIS — Z79899 Other long term (current) drug therapy: Secondary | ICD-10-CM | POA: Insufficient documentation

## 2018-05-20 NOTE — Progress Notes (Signed)
Daily Session Note  Patient Details  Name: Alfred Carney MRN: [id number] Date of Birth: 11/22/1950 Referring Provider:     Pulmonary Rehab from 01/20/2018 in [facility] Cardiac and Pulmonary Rehab  Referring Provider  Christinia Gully MD      Encounter Date: 05/20/2018  Check In: Session Check In - 05/20/18 1115      Check-In   Supervising physician immediately available to respond to emergencies  See telemetry face sheet for immediately available ER MD    Physician(s)   Dr. Alfred Levins and Jimmye Norman    Location  [facility]-Cardiac & Pulmonary Rehab    Staff Present  Renita Papa, RN BSN;Hanah Moultry Luan Pulling, MA, RCEP, CCRP, Exercise Physiologist;Joseph Tessie Fass RCP,RRT,BSRT    Medication changes reported      No    Fall or balance concerns reported     No    Warm-up and Cool-down  Performed as group-led instruction    Resistance Training Performed  Yes    VAD Patient?  No    PAD/SET Patient?  No      Pain Assessment   Currently in Pain?  No/denies          Social History   Tobacco Use  Smoking Status Former Smoker  . Packs/day: 1.00  . Years: 35.00  . Pack years: 35.00  . Types: Cigarettes  . Last attempt to quit: 09/16/2006  . Years since quitting: 11.6  Smokeless Tobacco Never Used    Goals Met:  Proper associated with RPD/PD & O2 Sat Independence with exercise equipment Using PLB without cueing & demonstrates good technique Exercise tolerated well No report of cardiac concerns or symptoms Strength training completed today  Goals Unmet:  Not Applicable  Comments: Pt able to follow exercise prescription today without complaint.  Will continue to monitor for progression.    Dr. Emily Filbert is Medical Director for [place] and [facility].

## 2018-05-22 ENCOUNTER — Encounter: Payer: Medicare HMO | Admitting: *Deleted

## 2018-05-22 DIAGNOSIS — J449 Chronic obstructive pulmonary disease, unspecified: Secondary | ICD-10-CM | POA: Diagnosis not present

## 2018-05-22 DIAGNOSIS — I2510 Atherosclerotic heart disease of native coronary artery without angina pectoris: Secondary | ICD-10-CM | POA: Diagnosis not present

## 2018-05-22 DIAGNOSIS — E119 Type 2 diabetes mellitus without complications: Secondary | ICD-10-CM | POA: Diagnosis not present

## 2018-05-22 DIAGNOSIS — Z7984 Long term (current) use of oral hypoglycemic drugs: Secondary | ICD-10-CM | POA: Diagnosis not present

## 2018-05-22 DIAGNOSIS — Z79899 Other long term (current) drug therapy: Secondary | ICD-10-CM | POA: Diagnosis not present

## 2018-05-22 DIAGNOSIS — M1990 Unspecified osteoarthritis, unspecified site: Secondary | ICD-10-CM | POA: Diagnosis not present

## 2018-05-22 DIAGNOSIS — Z9981 Dependence on supplemental oxygen: Secondary | ICD-10-CM | POA: Diagnosis not present

## 2018-05-22 DIAGNOSIS — I10 Essential (primary) hypertension: Secondary | ICD-10-CM | POA: Diagnosis not present

## 2018-05-22 DIAGNOSIS — E785 Hyperlipidemia, unspecified: Secondary | ICD-10-CM | POA: Diagnosis not present

## 2018-05-22 DIAGNOSIS — Z7982 Long term (current) use of aspirin: Secondary | ICD-10-CM | POA: Diagnosis not present

## 2018-05-22 NOTE — Patient Instructions (Signed)
Discharge Patient Instructions  Patient Details  Name: Alfred Carney MRN: [id number] Date of Birth: 12/05/1950 Referring Provider:  Howell, Tamieka, MD   Number of Visits: 36  Reason for Discharge:  Patient reached a stable level of exercise. Patient independent in their exercise. Patient has met program and personal goals.  Smoking History:  Social History   Tobacco Use  Smoking Status Former Smoker  . Packs/day: 1.00  . Years: 35.00  . Pack years: 35.00  . Types: Cigarettes  . Last attempt to quit: 09/16/2006  . Years since quitting: 11.6  Smokeless Tobacco Never Used    Diagnosis:  Chronic obstructive pulmonary disease, unspecified COPD type (HCC)  Initial Exercise Prescription: Initial Exercise Prescription - 01/20/18 1500      Date of Initial Exercise RX and Referring Provider   Date  01/20/18    Referring Provider  Wert, Michael MD      Oxygen   Oxygen  Continuous    Liters  4      Treadmill   MPH  0.8    Grade  0    Minutes  15    METs  1.6      T5 Nustep   Level  1    SPM  80    Minutes  15    METs  1.5      Biostep-RELP   Level  1    SPM  40    Minutes  15    METs  2      Prescription Details   Frequency (times per week)  3    Duration  Progress to 45 minutes of aerobic exercise without signs/symptoms of physical distress      Intensity   THRR 40-80% of Max Heartrate  103-136    Ratings of Perceived Exertion  11-13    Perceived Dyspnea  0-4      Progression   Progression  Continue to progress workloads to maintain intensity without signs/symptoms of physical distress.      Resistance Training   Training Prescription  Yes    Weight  3 lbs    Reps  10-15       Discharge Exercise Prescription (Final Exercise Prescription Changes): Exercise Prescription Changes - 05/11/18 1500      Response to Exercise   Blood Pressure (Admit)  136/74    Blood Pressure (Exit)  114/60    Heart Rate (Admit)  67 bpm    Heart Rate (Exercise)  96 bpm     Heart Rate (Exit)  76 bpm    Oxygen Saturation (Admit)  95 %    Oxygen Saturation (Exercise)  90 %    Oxygen Saturation (Exit)  92 %    Rating of Perceived Exertion (Exercise)  13    Perceived Dyspnea (Exercise)  3    Symptoms  SOB    Duration  Continue with 45 min of aerobic exercise without signs/symptoms of physical distress.    Intensity  THRR unchanged      Progression   Progression  Continue to progress workloads to maintain intensity without signs/symptoms of physical distress.    Average METs  2.29      Resistance Training   Training Prescription  Yes    Weight  5 lbs    Reps  10-15      Interval Training   Interval Training  No      Oxygen   Oxygen  Continuous    Liters  3        Treadmill   MPH  1    Grade  0    Minutes  15    METs  1.77      T5 Nustep   Level  3    Minutes  15    METs  3.1      Biostep-RELP   Level  7    Minutes  15    METs  2      Home Exercise Plan   Plans to continue exercise at  Home (comment)   walking, chair exercises   Frequency  Add 2 additional days to program exercise sessions.    Initial Home Exercises Provided  02/25/18       Functional Capacity: 6 Minute Walk    Row Name 01/20/18 1526 05/13/18 1213       6 Minute Walk   Phase  Initial  Discharge    Distance  142 feet  660 feet    Distance % Change  -  364.8 %    Distance Feet Change  -  518 ft    Walk Time  1.05 minutes test terminated due to desaturation  4.5 minutes    # of Rest Breaks  0  1 1:30    MPH  1.54  1.67    METS  0.09  0.82    RPE  13  15    Perceived Dyspnea   2  3.5    VO2 Peak  0.32  2.87    Symptoms  Yes (comment)  Yes (comment)    Comments  SOB, uses cane for balance  SOB, using cane    Resting HR  70 bpm  60 bpm    Resting BP  136/64  122/66    Resting Oxygen Saturation   90 %  90 %    Exercise Oxygen Saturation  during 6 min walk  73 %  81 %    Max Ex. HR  100 bpm  97 bpm    Max Ex. BP  168/74  142/64    2 Minute Post BP  156/70  140/74  134/66      Interval HR   1 Minute HR  100  83    2 Minute HR  99 test stopped at 1:03  -    3 Minute HR  91  86    4 Minute HR  77  95    5 Minute HR  -  97    6 Minute HR  68  96    2 Minute Post HR  -  86    Interval Heart Rate?  Yes  Yes      Interval Oxygen   Interval Oxygen?  Yes  Yes    Baseline Oxygen Saturation %  90 %  90 %    1 Minute Oxygen Saturation %  80 % test terminated at 1:03 73%  89 %    1 Minute Liters of Oxygen  2 L continuous  2 L continuos    2 Minute Oxygen Saturation %  87 %  89 %    2 Minute Liters of Oxygen  2 L  2 L    3 Minute Oxygen Saturation %  -  82 % rest break 3:16-4:31    3 Minute Liters of Oxygen  -  2 L    4 Minute Oxygen Saturation %  95 %  84 %    4 Minute Liters of Oxygen  2 L    2 L    5 Minute Oxygen Saturation %  -  85 %    5 Minute Liters of Oxygen  -  2 L    6 Minute Oxygen Saturation %  94 %  81 %    6 Minute Liters of Oxygen  2 L  2 L    2 Minute Post Oxygen Saturation %  -  95 %    2 Minute Post Liters of Oxygen  -  2 L       Quality of Life:   Personal Goals: Goals established at orientation with interventions provided to work toward goal. Personal Goals and Risk Factors at Admission - 01/20/18 1443      Core Components/Risk Factors/Patient Goals on Admission    Weight Management  Yes;Obesity;Weight Loss    Intervention  Weight Management: Develop a combined nutrition and exercise program designed to reach desired caloric intake, while maintaining appropriate intake of nutrient and fiber, sodium and fats, and appropriate energy expenditure required for the weight goal.;Weight Management: Provide education and appropriate resources to help participant work on and attain dietary goals.;Weight Management/Obesity: Establish reasonable short term and long term weight goals.    Admit Weight  318 lb 14.4 oz (144.7 kg)    Goal Weight: Short Term  313 lb (142 kg)    Goal Weight: Long Term  200 lb (90.7 kg)    Expected  Outcomes  Short Term: Continue to assess and modify interventions until short term weight is achieved;Long Term: Adherence to nutrition and physical activity/exercise program aimed toward attainment of established weight goal;Weight Maintenance: Understanding of the daily nutrition guidelines, which includes 25-35% calories from fat, 7% or less cal from saturated fats, less than 268m cholesterol, less than 1.5gm of sodium, & 5 or more servings of fruits and vegetables daily;Weight Loss: Understanding of general recommendations for a balanced deficit meal plan, which promotes 1-2 lb weight loss per week and includes a negative energy balance of [phone] kcal/d;Understanding recommendations for meals to include 15-35% energy as protein, 25-35% energy from fat, 35-60% energy from carbohydrates, less than 2068mof dietary cholesterol, 20-35 gm of total fiber daily;Understanding of distribution of calorie intake throughout the day with the consumption of 4-5 meals/snacks    Improve shortness of breath with ADL's  Yes    Intervention  Provide education, individualized exercise plan and daily activity instruction to help decrease symptoms of SOB with activities of daily living.    Expected Outcomes  Short Term: Improve cardiorespiratory fitness to achieve a reduction of symptoms when performing ADLs;Long Term: Be able to perform more ADLs without symptoms or delay the onset of symptoms    Diabetes  Yes    Intervention  Provide education about signs/symptoms and action to take for hypo/hyperglycemia.;Provide education about proper nutrition, including hydration, and aerobic/resistive exercise prescription along with prescribed medications to achieve blood glucose in normal ranges: Fasting glucose 65-99 mg/dL    Expected Outcomes  Short Term: Participant verbalizes understanding of the signs/symptoms and immediate care of hyper/hypoglycemia, proper foot care and importance of medication, aerobic/resistive exercise and  nutrition plan for blood glucose control.;Long Term: Attainment of HbA1C < 7%.    Heart Failure  Yes    Intervention  Provide a combined exercise and nutrition program that is supplemented with education, support and counseling about heart failure. Directed toward relieving symptoms such as shortness of breath, decreased exercise tolerance, and extremity edema.    Expected Outcomes  Improve functional capacity of life;Short term:  Attendance in program 2-3 days a week with increased exercise capacity. Reported lower sodium intake. Reported increased fruit and vegetable intake. Reports medication compliance.;Long term: Adoption of self-care skills and reduction of barriers for early signs and symptoms recognition and intervention leading to self-care maintenance.;Short term: Daily weights obtained and reported for increase. Utilizing diuretic protocols set by physician.    Hypertension  Yes    Intervention  Provide education on lifestyle modifcations including regular physical activity/exercise, weight management, moderate sodium restriction and increased consumption of fresh fruit, vegetables, and low fat dairy, alcohol moderation, and smoking cessation.;Monitor prescription use compliance.    Expected Outcomes  Short Term: Continued assessment and intervention until BP is < 140/88m HG in hypertensive participants. < 130/864mHG in hypertensive participants with diabetes, heart failure or chronic kidney disease.;Long Term: Maintenance of blood pressure at goal levels.    Lipids  Yes    Intervention  Provide education and support for participant on nutrition & aerobic/resistive exercise along with prescribed medications to achieve LDL <7057mHDL >51m16m  Expected Outcomes  Long Term: Cholesterol controlled with medications as prescribed, with individualized exercise RX and with personalized nutrition plan. Value goals: LDL < 70mg33mL > 40 mg.;Short Term: Participant states understanding of desired  cholesterol values and is compliant with medications prescribed. Participant is following exercise prescription and nutrition guidelines.        Personal Goals Discharge: Goals and Risk Factor Review - 04/24/18 1205      Core Components/Risk Factors/Patient Goals Review   Personal Goals Review  Weight Management/Obesity;Diabetes;Hypertension;Lipids;Heart Failure;Improve shortness of breath with ADL's    Review  Kacey Dannoninues to lose weight.  We talked about getting in more home exercise and adding in intervals on seated equipment to help with weight loss.  He does not check his blood sugars at home.  He is doing well with his blood pressures.  He does not have any symptoms of his heart failure.      Expected Outcomes  Short: Continue to work on weight loss.  Long: Continue to monitor risk factors.        Exercise Goals and Review: Exercise Goals    Row Name 01/20/18 1539             Exercise Goals   Increase Physical Activity  Yes       Intervention  Provide advice, education, support and counseling about physical activity/exercise needs.;Develop an individualized exercise prescription for aerobic and resistive training based on initial evaluation findings, risk stratification, comorbidities and participant's personal goals.       Expected Outcomes  Short Term: Attend rehab on a regular basis to increase amount of physical activity.;Long Term: Add in home exercise to make exercise part of routine and to increase amount of physical activity.;Long Term: Exercising regularly at least 3-5 days a week.       Increase Strength and Stamina  Yes       Intervention  Provide advice, education, support and counseling about physical activity/exercise needs.;Develop an individualized exercise prescription for aerobic and resistive training based on initial evaluation findings, risk stratification, comorbidities and participant's personal goals.       Expected Outcomes  Short Term: Increase workloads  from initial exercise prescription for resistance, speed, and METs.;Short Term: Perform resistance training exercises routinely during rehab and add in resistance training at home;Long Term: Improve cardiorespiratory fitness, muscular endurance and strength as measured by increased METs and functional capacity (6MWT)  Able to understand and use rate of perceived exertion (RPE) scale  Yes       Intervention  Provide education and explanation on how to use RPE scale       Expected Outcomes  Short Term: Able to use RPE daily in rehab to express subjective intensity level;Long Term:  Able to use RPE to guide intensity level when exercising independently       Able to understand and use Dyspnea scale  Yes       Intervention  Provide education and explanation on how to use Dyspnea scale       Expected Outcomes  Short Term: Able to use Dyspnea scale daily in rehab to express subjective sense of shortness of breath during exertion;Long Term: Able to use Dyspnea scale to guide intensity level when exercising independently       Knowledge and understanding of Target Heart Rate Range (THRR)  Yes       Intervention  Provide education and explanation of THRR including how the numbers were predicted and where they are located for reference       Expected Outcomes  Short Term: Able to state/look up THRR;Short Term: Able to use daily as guideline for intensity in rehab;Long Term: Able to use THRR to govern intensity when exercising independently       Able to check pulse independently  Yes       Intervention  Provide education and demonstration on how to check pulse in carotid and radial arteries.;Review the importance of being able to check your own pulse for safety during independent exercise       Expected Outcomes  Short Term: Able to explain why pulse checking is important during independent exercise;Long Term: Able to check pulse independently and accurately       Understanding of Exercise Prescription  Yes        Intervention  Provide education, explanation, and written materials on patient's individual exercise prescription       Expected Outcomes  Short Term: Able to explain program exercise prescription;Long Term: Able to explain home exercise prescription to exercise independently          Nutrition & Weight - Outcomes: Pre Biometrics - 01/20/18 1540      Pre Biometrics   Height  5' 9" (1.753 m)    Weight  (!) 318 lb 14.4 oz (144.7 kg)    Waist Circumference  56.5 inches    Hip Circumference  46.5 inches    Waist to Hip Ratio  1.22 %    BMI (Calculated)  47.07    Single Leg Stand  1.93 seconds      Post Biometrics - 05/13/18 1218       Post  Biometrics   Height  5' 9" (1.753 m)    Weight  (!) 313 lb 3.2 oz (142.1 kg)    Waist Circumference  54 inches    Hip Circumference  46 inches    Waist to Hip Ratio  1.17 %    BMI (Calculated)  46.23    Single Leg Stand  1.03 seconds       Nutrition: Nutrition Therapy & Goals - 03/04/18 1055      Nutrition Therapy   Diet  DM/ TLC    Drug/Food Interactions  Purine/Gout    Protein (specify units)  13oz    Fiber  30 grams    Whole Grain Foods  3 servings    Saturated Fats  16 max. grams  Fruits and Vegetables  5 servings/day   8 ideal. eats 2 meals per day currently   Sodium  1500 grams      Personal Nutrition Goals   Nutrition Goal  Increase fluid intake throughout the day, aiming for 48-64oz total to help with fluid retention and thinning mucus   Currently drinks 2 bottles of water per day plus 1-3 diet sodas   Personal Goal #2  Continue to choose lower sodium options for snack foods. When eating out, use the guidelines provided to help you order foods that are lower in sodium and fat    Personal Goal #3  Consider eating dinner at home once per week, slowly progressing to eating more meals at home over the course of the week which will help better control parameters like sodium, fat, portion sizes and total calories    Comments   He and his wife currently eat out most if not all nights of the week. He does not eat lunch but he does try to limit dietary sources of added sugar and sodium. He has also been working to reduce his portion sizes and to choose more vegetables at supper      Intervention Plan   Intervention  Nutrition handout(s) given to patient.;Prescribe, educate and counsel regarding individualized specific dietary modifications aiming towards targeted core components such as weight, hypertension, lipid management, diabetes, heart failure and other comorbidities.   COPD handout, Eating out handout, Low sodium nutrition therapy handout   Expected Outcomes  Short Term Goal: Understand basic principles of dietary content, such as calories, fat, sodium, cholesterol and nutrients.;Short Term Goal: A plan has been developed with personal nutrition goals set during dietitian appointment.;Long Term Goal: Adherence to prescribed nutrition plan.       Nutrition Discharge: Nutrition Assessments - 04/24/18 1151      MEDFICTS Scores   Post Score  90       Education Questionnaire Score: Knowledge Questionnaire Score - 04/24/18 1149      Knowledge Questionnaire Score   Pre Score  13/18    Post Score  18/18   reviewed with patient       Goals reviewed with patient; copy given to patient. 

## 2018-05-22 NOTE — Progress Notes (Signed)
Pulmonary Individual Treatment Plan  Patient Details  Name: Alfred Carney MRN: [id number] Date of Birth: 1950-12-01 Referring Provider:     Pulmonary Rehab from 01/20/2018 in [facility] Cardiac and Pulmonary Rehab  Referring Provider  Christinia Gully MD      Initial Encounter Date:    Pulmonary Rehab from 01/20/2018 in [facility] Cardiac and Pulmonary Rehab  Date  01/20/18      Visit Diagnosis: Chronic obstructive pulmonary disease, unspecified COPD type ([place])  Patient's Home Medications on Admission:  Current Outpatient Medications:  .  albuterol (PROVENTIL HFA;VENTOLIN HFA) 108 (90 Base) MCG/ACT inhaler, Inhale 2 puffs into the lungs every 6 (six) hours as needed for wheezing or shortness of breath., Disp: 1 Inhaler, Rfl: 6 .  amLODipine (NORVASC) 5 MG tablet, Take 5 mg by mouth daily. , Disp: , Rfl:  .  Calcium Carbonate-Simethicone (ALKA-SELTZER HEARTBURN + GAS) 750-80 MG CHEW, Chew by mouth as needed., Disp: , Rfl:  .  Cholecalciferol (VITAMIN D PO), Take 1 capsule by mouth 2 (two) times daily., Disp: , Rfl:  .  ferrous sulfate 325 (65 FE) MG tablet, Take 325 mg by mouth daily with breakfast. , Disp: , Rfl:  .  furosemide (LASIX) 20 MG tablet, Take 1 tablet (20 mg total) by mouth daily as needed., Disp: 30 tablet, Rfl: 11 .  irbesartan (AVAPRO) 300 MG tablet, Take 300 mg by mouth daily. , Disp: , Rfl:  .  ketoconazole (NIZORAL) 2 % shampoo, Apply 1 application topically every other day. , Disp: , Rfl:  .  metFORMIN (GLUCOPHAGE) 500 MG tablet, Take 500 mg by mouth 2 (two) times daily with a meal. , Disp: , Rfl:  .  metoprolol succinate (TOPROL-XL) 25 MG 24 hr tablet, Take 25 mg by mouth daily., Disp: , Rfl:  .  nitroGLYCERIN (NITROSTAT) 0.4 MG SL tablet, Place 1 tablet (0.4 mg total) under the tongue every 5 (five) minutes x 3 doses as needed for chest pain., Disp: 25 tablet, Rfl: 4 .  omeprazole (PRILOSEC) 40 MG capsule, Take 1 capsule (40 mg total) by mouth daily., Disp: 90 capsule, Rfl: 3 .   OXYGEN, Inhale 2 L into the lungs continuous. , Disp: , Rfl:  .  rosuvastatin (CRESTOR) 10 MG tablet, Take 5 mg by mouth daily., Disp: , Rfl:  .  tamsulosin (FLOMAX) 0.4 MG CAPS capsule, Take 0.4 mg by mouth daily. , Disp: , Rfl:   Past Medical History: Past Medical History:  Diagnosis Date  . Chronic obstructive pulmonary disease ([place]) 2008   Moderate  . Colon polyps   . Community acquired pneumonia   . Coronary artery disease   . Degenerative joint disease   . Diabetes mellitus without complication ([place])    type 2  . Emphysema lung ([place])   . Enlarged liver    fatty liver by '09 CT  . Gastroesophageal reflux disease   . Gout   . H/O hiatal hernia   . History of [place] spotted fever    Possible history of [place] Spotted Fever  . Hyperlipidemia   . Hypertension   . Hypokalemia    diuretic induced, resolved  . Microcytic anemia    iron pills  . Morbid obesity ([place])   . Shortness of breath   . Skin cancer    skin cancer lip removed  . Sleep apnea    not currently using CPAP 05/20/13  . 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 Packs/day: 1.00  . Years: 35.00  . Pack years: 35.00  . Types: Cigarettes  . Last attempt to quit: 09/16/2006  . Years since quitting: 11.6  Smokeless Tobacco Never Used    Labs: Recent Review Scientist, physiological    Labs for ITP Cardiac and Pulmonary Rehab Latest Ref Rng & Units 10/15/2011 01/14/2012 01/29/2014 05/08/2014   Cholestrol 0 - 200 mg/dL 167 82 88 -   LDLCALC 0 - 99 mg/dL 104(H) 25 32 -   HDL >39 mg/dL 41.00 36.40(L) 30(L) -   Trlycerides <150 mg/dL 112.0 102.0 130 -   Hemoglobin A1c <5.7 % - - - 6.6(H)       Pulmonary Assessment Scores: Pulmonary Assessment Scores    Row Name 01/20/18 1502 04/24/18 1148 05/13/18 1218     ADL UCSD   ADL Phase  Entry  Exit  Exit   SOB Score total  88  70  70   Rest  1  0  0    Walk  4  3  3    Stairs  4  4  4    Bath  5  3  3    Dress  3  2  2    Shop  5  5  5      CAT Score   CAT Score  23  17  17      mMRC Score   mMRC Score  4  -  3      Pulmonary Function Assessment: Pulmonary Function Assessment - 01/20/18 1439      Pulmonary Function Tests   FVC%  64 %    FEV1%  52 %      Breath   Bilateral Breath Sounds  Clear;Decreased    Shortness of Breath  Yes;Limiting activity       Exercise Target Goals: Exercise Program Goal: Individual exercise prescription set using results from initial 6 min walk test and THRR while considering  patient's activity barriers and safety.   Exercise Prescription Goal: Initial exercise prescription builds to 30-45 minutes a day of aerobic activity, 2-3 days per week.  Home exercise guidelines will be given to patient during program as part of exercise prescription that the participant will acknowledge.  Activity Barriers & Risk Stratification: Activity Barriers & Cardiac Risk Stratification - 01/20/18 1531      Activity Barriers & Cardiac Risk Stratification   Activity Barriers  Deconditioning;Muscular Weakness;Balance Concerns;Shortness of Breath;History of Falls;Assistive Device   uses cane for balance      6 Minute Walk: 6 Minute Walk    Row Name 01/20/18 1526 05/13/18 1213       6 Minute Walk   Phase  Initial  Discharge    Distance  142 feet  660 feet    Distance % Change  -  364.8 %    Distance Feet Change  -  518 ft    Walk Time  1.05 minutes test terminated due to desaturation  4.5 minutes    # of Rest Breaks  0  1 1:30    MPH  1.54  1.67    METS  0.09  0.82    RPE  13  15    Perceived Dyspnea   2  3.5    VO2 Peak  0.32  2.87    Symptoms  Yes (comment)  Yes (comment)    Comments  SOB, uses cane for balance  SOB, using cane    Resting HR  70 bpm  60 bpm    Resting BP  136/64  122/66    Resting Oxygen Saturation   90 %  90 %    Exercise Oxygen Saturation  during 6 min walk  73 %  81 %    Max  Ex. HR  100 bpm  97 bpm    Max Ex. BP  168/74  142/64    2 Minute Post BP  156/70 140/74  134/66      Interval HR   1 Minute HR  100  83    2 Minute HR  99 test stopped at 1:03  -    3 Minute HR  91  86    4 Minute HR  77  95    5 Minute HR  -  97    6 Minute HR  68  96    2 Minute Post HR  -  86    Interval Heart Rate?  Yes  Yes      Interval Oxygen   Interval Oxygen?  Yes  Yes    Baseline Oxygen Saturation %  90 %  90 %    1 Minute Oxygen Saturation %  80 % test terminated at 1:03 73%  89 %    1 Minute Liters of Oxygen  2 L continuous  2 L continuos    2 Minute Oxygen Saturation %  87 %  89 %    2 Minute Liters of Oxygen  2 L  2 L    3 Minute Oxygen Saturation %  -  82 % rest break 3:16-4:31    3 Minute Liters of Oxygen  -  2 L    4 Minute Oxygen Saturation %  95 %  84 %    4 Minute Liters of Oxygen  2 L  2 L    5 Minute Oxygen Saturation %  -  85 %    5 Minute Liters of Oxygen  -  2 L    6 Minute Oxygen Saturation %  94 %  81 %    6 Minute Liters of Oxygen  2 L  2 L    2 Minute Post Oxygen Saturation %  -  95 %    2 Minute Post Liters of Oxygen  -  2 L      Oxygen Initial Assessment: Oxygen Initial Assessment - 01/20/18 1441      Home Oxygen   Home Oxygen Device  Home Concentrator;E-Tanks    Sleep Oxygen Prescription  CPAP;Continuous    Liters per minute  2    Home Exercise Oxygen Prescription  Continuous    Liters per minute  2    Home at Rest Exercise Oxygen Prescription  Continuous    Liters per minute  2    Compliance with Home Oxygen Use  No   does not wear CPAP     Initial 6 min Walk   Oxygen Used  Continuous    Liters per minute  2      Program Oxygen Prescription   Program Oxygen Prescription  Continuous    Liters per minute  2      Intervention   Short Term Goals  To learn and understand importance of monitoring SPO2 with pulse oximeter and demonstrate accurate use of the pulse oximeter.;To learn and demonstrate proper pursed lip breathing  techniques or other breathing techniques.;To learn and demonstrate proper use  of respiratory medications;To learn and understand importance of maintaining oxygen saturations>88%;To learn and exhibit compliance with exercise, home and travel O2 prescription    Long  Term Goals  Exhibits compliance with exercise, home and travel O2 prescription;Verbalizes importance of monitoring SPO2 with pulse oximeter and return demonstration;Maintenance of O2 saturations>88%;Exhibits proper breathing techniques, such as pursed lip breathing or other method taught during program session;Compliance with respiratory medication;Demonstrates proper use of MDI's       Oxygen Re-Evaluation: Oxygen Re-Evaluation    Row Name 02/27/18 1149 04/03/18 1241 04/15/18 1414 04/24/18 1206       Program Oxygen Prescription   Program Oxygen Prescription  Continuous  Continuous  Continuous  Continuous;E-Tanks    Liters per minute  4  4  4  3     Comments  4 liters for exercise  4 liters for exercise  4 liters for exercise  -      Home Oxygen   Home Oxygen Device  Home Concentrator;E-Tanks  Home Concentrator;E-Tanks  Home Concentrator;E-Tanks  Home Concentrator;E-Tanks    Sleep Oxygen Prescription  CPAP;Continuous  CPAP;Continuous  CPAP;Continuous  CPAP;Continuous    Liters per minute  2  2  2  2     Home Exercise Oxygen Prescription  Continuous  Continuous  Continuous  Continuous    Liters per minute  2  2  2  2     Home at Rest Exercise Oxygen Prescription  Continuous  Continuous  Continuous  Continuous    Liters per minute  2  2  -  -    Compliance with Home Oxygen Use  No he states he cannot tolerate that.  No  No He does not use his oxygen in the car or at the store.  No does not use oxygen at home as much, nor does he use his CPAP      Goals/Expected Outcomes   Short Term Goals  To learn and understand importance of monitoring SPO2 with pulse oximeter and demonstrate accurate use of the pulse oximeter.;To learn and  demonstrate proper pursed lip breathing techniques or other breathing techniques.;To learn and demonstrate proper use of respiratory medications;To learn and understand importance of maintaining oxygen saturations>88%;To learn and exhibit compliance with exercise, home and travel O2 prescription  To learn and understand importance of monitoring SPO2 with pulse oximeter and demonstrate accurate use of the pulse oximeter.;To learn and demonstrate proper pursed lip breathing techniques or other breathing techniques.;To learn and demonstrate proper use of respiratory medications;To learn and understand importance of maintaining oxygen saturations>88%;To learn and exhibit compliance with exercise, home and travel O2 prescription  To learn and understand importance of monitoring SPO2 with pulse oximeter and demonstrate accurate use of the pulse oximeter.;To learn and demonstrate proper pursed lip breathing techniques or other breathing techniques.;To learn and demonstrate proper use of respiratory medications;To learn and understand importance of maintaining oxygen saturations>88%;To learn and exhibit compliance with exercise, home and travel O2 prescription  To learn and understand importance of monitoring SPO2 with pulse oximeter and demonstrate accurate use of the pulse oximeter.;To learn and demonstrate proper pursed lip breathing techniques or other breathing techniques.;To learn and demonstrate proper use of respiratory medications;To learn and understand importance of maintaining oxygen saturations>88%;To learn and exhibit compliance with exercise, home and travel O2 prescription    Long  Term Goals  Exhibits compliance with exercise, home and travel O2 prescription;Verbalizes importance of monitoring SPO2 with pulse oximeter and return demonstration;Maintenance of O2 saturations>88%;Exhibits proper breathing techniques, such as pursed  lip breathing or other method taught during program session;Compliance  with respiratory medication;Demonstrates proper use of MDI's  Exhibits compliance with exercise, home and travel O2 prescription;Verbalizes importance of monitoring SPO2 with pulse oximeter and return demonstration;Maintenance of O2 saturations>88%;Exhibits proper breathing techniques, such as pursed lip breathing or other method taught during program session;Compliance with respiratory medication;Demonstrates proper use of MDI's  Exhibits compliance with exercise, home and travel O2 prescription;Verbalizes importance of monitoring SPO2 with pulse oximeter and return demonstration;Maintenance of O2 saturations>88%;Exhibits proper breathing techniques, such as pursed lip breathing or other method taught during program session;Compliance with respiratory medication;Demonstrates proper use of MDI's  Exhibits compliance with exercise, home and travel O2 prescription;Verbalizes importance of monitoring SPO2 with pulse oximeter and return demonstration;Maintenance of O2 saturations>88%;Exhibits proper breathing techniques, such as pursed lip breathing or other method taught during program session;Compliance with respiratory medication;Demonstrates proper use of MDI's    Comments  Alfred Carney does not take nebulizers and has a albuterol inhaler if needed. He needs to work on PLB at home a little more. He checks his oxygen often at home and tries to keep it above 88 percent.  Alfred Carney really wants to work on coming off of his oxygen. He does not use it in the car and sats have maintained above 90%.  He is going to start trying to walk down to rehab and back to car afterwards.  He is going to start with 2L.  Alfred Carney is going to try to use 3 liters of oxyen on his machines instread of 4 liters. He states he does not always use his oxygen in the car and at the store. Informed patient to use his oxygen when he is out and about to decrease risk of his oxygen getting too low.  Alfred Carney has been getting a little better with oxygen.  He does not  use it at home, like he should, but does keep an eye on his saturations.     Goals/Expected Outcomes  Short: work on PLB while on the treadmill. Long: Be independent with PLB  Short: Continue to use PLB and walk into rehab.  Long: Continue to work on coming off oxygen.   Short: decrease oxygen to 3 liters. Long: decrease oxygen to 2 liters on all exercises.  Short: Continue to work on using 3 liters.  Long: Continue to use PLB to manage breathing.        Oxygen Discharge (Final Oxygen Re-Evaluation): Oxygen Re-Evaluation - 04/24/18 1206      Program Oxygen Prescription   Program Oxygen Prescription  Continuous;E-Tanks    Liters per minute  3      Home Oxygen   Home Oxygen Device  Home Concentrator;E-Tanks    Sleep Oxygen Prescription  CPAP;Continuous    Liters per minute  2    Home Exercise Oxygen Prescription  Continuous    Liters per minute  2    Home at Rest Exercise Oxygen Prescription  Continuous    Compliance with Home Oxygen Use  No   does not use oxygen at home as much, nor does he use his CPAP     Goals/Expected Outcomes   Short Term Goals  To learn and understand importance of monitoring SPO2 with pulse oximeter and demonstrate accurate use of the pulse oximeter.;To learn and demonstrate proper pursed lip breathing techniques or other breathing techniques.;To learn and demonstrate proper use of respiratory medications;To learn and understand importance of maintaining oxygen saturations>88%;To learn and exhibit compliance with exercise, home and travel O2  prescription    Long  Term Goals  Exhibits compliance with exercise, home and travel O2 prescription;Verbalizes importance of monitoring SPO2 with pulse oximeter and return demonstration;Maintenance of O2 saturations>88%;Exhibits proper breathing techniques, such as pursed lip breathing or other method taught during program session;Compliance with respiratory medication;Demonstrates proper use of MDI's    Comments  Alfred Carney has been  getting a little better with oxygen.  He does not use it at home, like he should, but does keep an eye on his saturations.     Goals/Expected Outcomes  Short: Continue to work on using 3 liters.  Long: Continue to use PLB to manage breathing.        Initial Exercise Prescription: Initial Exercise Prescription - 01/20/18 1500      Date of Initial Exercise RX and Referring Provider   Date  01/20/18    Referring Provider  Christinia Gully MD      Oxygen   Oxygen  Continuous    Liters  4      Treadmill   MPH  0.8    Grade  0    Minutes  15    METs  1.6      T5 Nustep   Level  1    SPM  80    Minutes  15    METs  1.5      Biostep-RELP   Level  1    SPM  40    Minutes  15    METs  2      Prescription Details   Frequency (times per week)  3    Duration  Progress to 45 minutes of aerobic exercise without signs/symptoms of physical distress      Intensity   THRR 40-80% of Max Heartrate  103-136    Ratings of Perceived Exertion  11-13    Perceived Dyspnea  0-4      Progression   Progression  Continue to progress workloads to maintain intensity without signs/symptoms of physical distress.      Resistance Training   Training Prescription  Yes    Weight  3 lbs    Reps  10-15       Perform Capillary Blood Glucose checks as needed.  Exercise Prescription Changes: Exercise Prescription Changes    Row Name 01/20/18 1500 02/03/18 1400 02/19/18 1400 02/25/18 1200 03/03/18 1400     Response to Exercise   Blood Pressure (Admit)  136/64  134/84  124/62  -  140/80   Blood Pressure (Exercise)  168/74  160/76  -  -  -   Blood Pressure (Exit)  140/74  124/74  102/62  -  128/68   Heart Rate (Admit)  70 bpm  67 bpm  65 bpm  -  62 bpm   Heart Rate (Exercise)  100 bpm  112 bpm  94 bpm  -  97 bpm   Heart Rate (Exit)  68 bpm  70 bpm  78 bpm  -  82 bpm   Oxygen Saturation (Admit)  90 %  95 %  93 %  -  95 %   Oxygen Saturation (Exercise)  73 %  88 %  90 %  -  89 %   Oxygen Saturation  (Exit)  94 %  94 %  94 %  -  93 %   Rating of Perceived Exertion (Exercise)  13  15  16   -  15   Perceived Dyspnea (Exercise)  2  3  4   -  3   Symptoms  SOB  SOB  SOB  -  SOB on treadmill   Comments  walk test results, uses cane  second full day of exercise  -  -  -   Duration  -  Progress to 45 minutes of aerobic exercise without signs/symptoms of physical distress  Continue with 45 min of aerobic exercise without signs/symptoms of physical distress.  -  Continue with 45 min of aerobic exercise without signs/symptoms of physical distress.   Intensity  -  THRR unchanged  THRR unchanged  -  THRR unchanged     Progression   Progression  -  Continue to progress workloads to maintain intensity without signs/symptoms of physical distress.  Continue to progress workloads to maintain intensity without signs/symptoms of physical distress.  -  Continue to progress workloads to maintain intensity without signs/symptoms of physical distress.   Average METs  -  1.83  1.8  -  2.23     Resistance Training   Training Prescription  -  Yes  Yes  -  Yes   Weight  -  3 lbs  3 lb  -  4 lbs   Reps  -  10-15  10-15  -  10-15     Interval Training   Interval Training  -  No  No  -  No     Oxygen   Oxygen  -  Continuous  Continuous  -  Continuous   Liters  -  4  4  -  4     Treadmill   MPH  -  0.8  0.8  -  0.8   Grade  -  0  0  -  0   Minutes  -  6 4 min, 2 min  6 6/3/5  -  15   METs  -  1.6  1.6  -  1.6     T5 Nustep   Level  -  1  -  -  3   SPM  -  78  -  -  76   Minutes  -  15  -  -  15   METs  -  1.9  -  -  2.1     Biostep-RELP   Level  -  1  2  -  4   SPM  -  44  42  -  44   Minutes  -  15  15  -  15   METs  -  2  2  -  3     Home Exercise Plan   Plans to continue exercise at  -  -  -  Home (comment) walking, chair exercises  Home (comment) walking, chair exercises   Frequency  -  -  -  Add 1 additional day to program exercise sessions.  Add 1 additional day to program exercise sessions.    Initial Home Exercises Provided  -  -  -  02/25/18  02/25/18   Row Name 03/17/18 1500 03/31/18 1500 04/14/18 1500 04/28/18 1400 05/11/18 1500     Response to Exercise   Blood Pressure (Admit)  128/70  128/70  130/70  128/78  136/74   Blood Pressure (Exit)  132/70  124/74  136/62  100/62  114/60   Heart Rate (Admit)  64 bpm  79 bpm  76 bpm  69 bpm  67 bpm   Heart Rate (Exercise)  89 bpm  90 bpm  95 bpm  99 bpm  96 bpm   Heart Rate (Exit)  74 bpm  84 bpm  74 bpm  72 bpm  76 bpm   Oxygen Saturation (Admit)  96 %  95 %  95 %  94 %  95 %   Oxygen Saturation (Exercise)  88 %  88 %  89 %  89 %  90 %   Oxygen Saturation (Exit)  95 %  95 %  94 %  94 %  92 %   Rating of Perceived Exertion (Exercise)  14  13  14  13  13    Perceived Dyspnea (Exercise)  3  2  2  3  3    Symptoms  SOB on treadmill  SOB on treadmill  SOB on treadmill  none  SOB   Duration  Continue with 45 min of aerobic exercise without signs/symptoms of physical distress.  Continue with 45 min of aerobic exercise without signs/symptoms of physical distress.  Continue with 45 min of aerobic exercise without signs/symptoms of physical distress.  Continue with 45 min of aerobic exercise without signs/symptoms of physical distress.  Continue with 45 min of aerobic exercise without signs/symptoms of physical distress.   Intensity  THRR unchanged  THRR unchanged  THRR unchanged  THRR unchanged  THRR unchanged     Progression   Progression  Continue to progress workloads to maintain intensity without signs/symptoms of physical distress.  Continue to progress workloads to maintain intensity without signs/symptoms of physical distress.  Continue to progress workloads to maintain intensity without signs/symptoms of physical distress.  Continue to progress workloads to maintain intensity without signs/symptoms of physical distress.  Continue to progress workloads to maintain intensity without signs/symptoms of physical distress.   Average METs  2.27  2.3   2.36  2.36  2.29     Resistance Training   Training Prescription  Yes  Yes  Yes  Yes  Yes   Weight  4 lbs  4 lbs  4 lbs  4 lbs  5 lbs   Reps  10-15  10-15  10-15  10-15  10-15     Interval Training   Interval Training  No  No  No  No  No     Oxygen   Oxygen  Continuous  Continuous  Continuous  Continuous  Continuous   Liters  4  4  4  3  3      Treadmill   MPH  0.8  0.8  1  1  1    Grade  0  0  0  0  0   Minutes  15  15  15  15  15    METs  1.6  1.6  1.77  1.77  1.77     T5 Nustep   Level  3  4  4  4  3    SPM  72  -  -  -  -   Minutes  15  15  15  15  15    METs  2.2  2.3  2.3  2.3  3.1     Biostep-RELP   Level  6  7  7  7  7    SPM  40  -  -  -  -   Minutes  15  15  15  15  15    METs  3  3  3  3  2      Home Exercise Plan   Plans to continue exercise  at  Home (comment) walking, chair exercises  Home (comment) walking, chair exercises  Home (comment) walking, chair exercises  Home (comment) walking, chair exercises  Home (comment) walking, chair exercises   Frequency  Add 2 additional days to program exercise sessions.  Add 2 additional days to program exercise sessions.  Add 2 additional days to program exercise sessions.  Add 2 additional days to program exercise sessions.  Add 2 additional days to program exercise sessions.   Initial Home Exercises Provided  02/25/18  02/25/18  02/25/18  02/25/18  02/25/18      Exercise Comments: Exercise Comments    Row Name 01/26/18 1146 05/22/18 1135         Exercise Comments  First full day of exercise!  Patient was oriented to gym and equipment including functions, settings, policies, and procedures.  Patient's individual exercise prescription and treatment plan were reviewed.  All starting workloads were established based on the results of the 6 minute walk test done at initial orientation visit.  The plan for exercise progression was also introduced and progression will be customized based on patient's performance and goals.  Alfred Carney  graduated today from  rehab with 36 sessions completed.  Details of the patient's exercise prescription and what He needs to do in order to continue the prescription and progress were discussed with patient.  Patient was given a copy of prescription and goals.  Patient verbalized understanding.  Alfred Carney plans to continue to exercise by attending [employer].         Exercise Goals and Review: Exercise Goals    Row Name 01/20/18 1539             Exercise Goals   Increase Physical Activity  Yes       Intervention  Provide advice, education, support and counseling about physical activity/exercise needs.;Develop an individualized exercise prescription for aerobic and resistive training based on initial evaluation findings, risk stratification, comorbidities and participant's personal goals.       Expected Outcomes  Short Term: Attend rehab on a regular basis to increase amount of physical activity.;Long Term: Add in home exercise to make exercise part of routine and to increase amount of physical activity.;Long Term: Exercising regularly at least 3-5 days a week.       Increase Strength and Stamina  Yes       Intervention  Provide advice, education, support and counseling about physical activity/exercise needs.;Develop an individualized exercise prescription for aerobic and resistive training based on initial evaluation findings, risk stratification, comorbidities and participant's personal goals.       Expected Outcomes  Short Term: Increase workloads from initial exercise prescription for resistance, speed, and METs.;Short Term: Perform resistance training exercises routinely during rehab and add in resistance training at home;Long Term: Improve cardiorespiratory fitness, muscular endurance and strength as measured by increased METs and functional capacity (6MWT)       Able to understand and use rate of perceived exertion (RPE) scale  Yes       Intervention  Provide education and explanation on how to  use RPE scale       Expected Outcomes  Short Term: Able to use RPE daily in rehab to express subjective intensity level;Long Term:  Able to use RPE to guide intensity level when exercising independently       Able to understand and use Dyspnea scale  Yes       Intervention  Provide education and explanation on how to use Dyspnea scale  Expected Outcomes  Short Term: Able to use Dyspnea scale daily in rehab to express subjective sense of shortness of breath during exertion;Long Term: Able to use Dyspnea scale to guide intensity level when exercising independently       Knowledge and understanding of Target Heart Rate Range (THRR)  Yes       Intervention  Provide education and explanation of THRR including how the numbers were predicted and where they are located for reference       Expected Outcomes  Short Term: Able to state/look up THRR;Short Term: Able to use daily as guideline for intensity in rehab;Long Term: Able to use THRR to govern intensity when exercising independently       Able to check pulse independently  Yes       Intervention  Provide education and demonstration on how to check pulse in carotid and radial arteries.;Review the importance of being able to check your own pulse for safety during independent exercise       Expected Outcomes  Short Term: Able to explain why pulse checking is important during independent exercise;Long Term: Able to check pulse independently and accurately       Understanding of Exercise Prescription  Yes       Intervention  Provide education, explanation, and written materials on patient's individual exercise prescription       Expected Outcomes  Short Term: Able to explain program exercise prescription;Long Term: Able to explain home exercise prescription to exercise independently          Exercise Goals Re-Evaluation : Exercise Goals Re-Evaluation    [place] Name 01/26/18 1146 02/03/18 1439 02/19/18 1434 02/25/18 1217 03/03/18 1447     Exercise Goal  Re-Evaluation   Exercise Goals Review  Increase Physical Activity;Able to understand and use Dyspnea scale;Increase Strength and Stamina;Knowledge and understanding of Target Heart Rate Range (THRR);Able to understand and use rate of perceived exertion (RPE) scale  Increase Physical Activity;Understanding of Exercise Prescription;Increase Strength and Stamina  Increase Physical Activity;Able to understand and use rate of perceived exertion (RPE) scale;Able to understand and use Dyspnea scale  Increase Physical Activity;Able to understand and use rate of perceived exertion (RPE) scale;Able to understand and use Dyspnea scale;Knowledge and understanding of Target Heart Rate Range (THRR);Understanding of Exercise Prescription;Increase Strength and Stamina;Able to check pulse independently  Increase Physical Activity;Understanding of Exercise Prescription;Increase Strength and Stamina   Comments  Reviewed RPE scale, THR and program prescription with pt today.  Pt voiced understanding and was given a copy of goals to take home.   Oryan is off to a good start in rehab.  He has only attend two full days of exercise due to other medical appointments he had scheduled. He will do well if he is able to come consistently to class each day.  We will continue to monitor his progress.   Pt has increased total walk without break from 4 to 6 minutes.  Staff will monitor progress.  Reviewed home exercise with pt today.  Pt plans to walk and do chair exercises at home for exercise.  He will start with one extra day a week.  Reviewed THR, pulse, RPE, sign and symptoms, NTG use, and when to call 911 or MD.  Also discussed weather considerations and indoor options.  Pt voiced understanding.  Keiston has been doing well in rehab.  He is now up to 4 lbs weights.  He is only averaging about two days a week and would benefit from coming all  three days consistently.  He has also increased workloads on the BioStep and T5 NuStep.  We will  continue to monitor his progression.    Expected Outcomes  Short: Use RPE daily to regulate intensity.  Long: Follow program prescription in THR.  Short: Attend rehab regularly.  Long: Continue to follow program prescription.   Short - pt will walk 8-10 min without stopping Long - Pt will walk 15 min without stopping  Short: Add in at least one extra day a week at home.  Long: Continue to exercise independently.   Short: Attend class regularly.  Long: Continue to exercise more at home.    [place] Name 03/17/18 1522 03/31/18 1502 04/03/18 1234 04/14/18 1508 04/24/18 1154     Exercise Goal Re-Evaluation   Exercise Goals Review  Increase Physical Activity;Understanding of Exercise Prescription;Increase Strength and Stamina  Increase Physical Activity;Understanding of Exercise Prescription;Increase Strength and Stamina  Increase Physical Activity;Understanding of Exercise Prescription;Increase Strength and Stamina  Increase Physical Activity;Understanding of Exercise Prescription;Increase Strength and Stamina  Increase Physical Activity;Understanding of Exercise Prescription;Increase Strength and Stamina   Comments  Alfred Carney continues to do well in rehab.  He is now up to 5lbs weights and doing level 6 on the BioStep  His attendance has gotten better. We will continue to monitor his progress.   Alfred Carney has been doing well in rehab.  He now up to level 7 on the BioStep!  He also continues to lose weight. We will continue to monitor his progress.   Alfred Carney has been doing well in rehab. He is losing weight!!  He has been doing weights at home and walking some.  He is getting in two extra days a week at home for 20-30 min.  Try start walk more at home. We talked about a goal being to walk down here and out by the time he graduates.    Alfred Carney is doing well in rehab.  He is now walking down the hall to class and back to the car without stopping!!!  He will continue to try to walk down and back without getting as SOB.  We will continue  to monitor his progress.   Alfred Carney is doing well in rehab.  He is walking down hall still and he and his wife will be joining [employer] after graduation.  He has been doing some walking at home.  There is a treadmill in the shed that he wants to get out.  He has also been working with handweights at home.    Expected Outcomes  Short: Continue to increase workloads.  Long: Continue to exercise on his own.   Short: Work on walking down to rehab.  Long: Continue to exercise on off days.   Short: Work on walking down to rehab.  Long: Continue to exercise on off days.   Short: Continue to work on walking down and back to class.  Long: Continue to exercise for weight loss.   Short: Continue to work on walking.  Improve post 6MWT next week!  Long: Continue to exercise to work on weight loss.    [place] Name 04/28/18 1423 05/11/18 1532           Exercise Goal Re-Evaluation   Exercise Goals Review  Increase Physical Activity;Understanding of Exercise Prescription;Increase Strength and Stamina  Increase Physical Activity;Understanding of Exercise Prescription;Increase Strength and Stamina      Comments  Alfred Carney continues to do well in rehab.  We will be doing his post walk this week  and expect good improvements.  We will continue to monitor his progresss.   Alfred Carney returned today after missing over a week for his fall.  He was still having some pain in his foot, but was able to exercise today.  We will do his post 6MWT soon and hopefully his leg/foot will continue to feel better. We continue to monitor his progress.       Expected Outcomes  Short: Improve post 6MWT.  Long: Continue to work on weight loss.   Short: Improve post 6MWT.  Long: Continue to increase strength and stamina.          Discharge Exercise Prescription (Final Exercise Prescription Changes): Exercise Prescription Changes - 05/11/18 1500      Response to Exercise   Blood Pressure (Admit)  136/74    Blood Pressure (Exit)  114/60    Heart Rate (Admit)   67 bpm    Heart Rate (Exercise)  96 bpm    Heart Rate (Exit)  76 bpm    Oxygen Saturation (Admit)  95 %    Oxygen Saturation (Exercise)  90 %    Oxygen Saturation (Exit)  92 %    Rating of Perceived Exertion (Exercise)  13    Perceived Dyspnea (Exercise)  3    Symptoms  SOB    Duration  Continue with 45 min of aerobic exercise without signs/symptoms of physical distress.    Intensity  THRR unchanged      Progression   Progression  Continue to progress workloads to maintain intensity without signs/symptoms of physical distress.    Average METs  2.29      Resistance Training   Training Prescription  Yes    Weight  5 lbs    Reps  10-15      Interval Training   Interval Training  No      Oxygen   Oxygen  Continuous    Liters  3      Treadmill   MPH  1    Grade  0    Minutes  15    METs  1.77      T5 Nustep   Level  3    Minutes  15    METs  3.1      Biostep-RELP   Level  7    Minutes  15    METs  2      Home Exercise Plan   Plans to continue exercise at  Home (comment)   walking, chair exercises   Frequency  Add 2 additional days to program exercise sessions.    Initial Home Exercises Provided  02/25/18       Nutrition:  Target Goals: Understanding of nutrition guidelines, daily intake of sodium <1553m, cholesterol <2070m calories 30% from fat and 7% or less from saturated fats, daily to have 5 or more servings of fruits and vegetables.  Biometrics: Pre Biometrics - 01/20/18 1540      Pre Biometrics   Height  5' 9"  (1.753 m)    Weight  (!) 318 lb 14.4 oz (144.7 kg)    Waist Circumference  56.5 inches    Hip Circumference  46.5 inches    Waist to Hip Ratio  1.22 %    BMI (Calculated)  47.07    Single Leg Stand  1.93 seconds      Post Biometrics - 05/13/18 1218       Post  Biometrics   Height  5' 9"  (1.753 m)  Weight  (!) 313 lb 3.2 oz (142.1 kg)    Waist Circumference  54 inches    Hip Circumference  46 inches    Waist to Hip Ratio  1.17 %     BMI (Calculated)  46.23    Single Leg Stand  1.03 seconds       Nutrition Therapy Plan and Nutrition Goals: Nutrition Therapy & Goals - 03/04/18 1055      Nutrition Therapy   Diet  DM/ TLC    Drug/Food Interactions  Purine/Gout    Protein (specify units)  13oz    Fiber  30 grams    Whole Grain Foods  3 servings    Saturated Fats  16 max. grams    Fruits and Vegetables  5 servings/day   8 ideal. eats 2 meals per day currently   Sodium  1500 grams      Personal Nutrition Goals   Nutrition Goal  Increase fluid intake throughout the day, aiming for 48-64oz total to help with fluid retention and thinning mucus   Currently drinks 2 bottles of water per day plus 1-3 diet sodas   Personal Goal #2  Continue to choose lower sodium options for snack foods. When eating out, use the guidelines provided to help you order foods that are lower in sodium and fat    Personal Goal #3  Consider eating dinner at home once per week, slowly progressing to eating more meals at home over the course of the week which will help better control parameters like sodium, fat, portion sizes and total calories    Comments  He and his wife currently eat out most if not all nights of the week. He does not eat lunch but he does try to limit dietary sources of added sugar and sodium. He has also been working to reduce his portion sizes and to choose more vegetables at supper      Intervention Plan   Intervention  Nutrition handout(s) given to patient.;Prescribe, educate and counsel regarding individualized specific dietary modifications aiming towards targeted core components such as weight, hypertension, lipid management, diabetes, heart failure and other comorbidities.   COPD handout, Eating out handout, Low sodium nutrition therapy handout   Expected Outcomes  Short Term Goal: Understand basic principles of dietary content, such as calories, fat, sodium, cholesterol and nutrients.;Short Term Goal: A plan has been  developed with personal nutrition goals set during dietitian appointment.;Long Term Goal: Adherence to prescribed nutrition plan.       Nutrition Assessments: Nutrition Assessments - 04/24/18 1151      MEDFICTS Scores   Post Score  90       Nutrition Goals Re-Evaluation: Nutrition Goals Re-Evaluation    Row Name 02/27/18 1154 03/04/18 1104 04/24/18 1200         Goals   Current Weight  317 lb (143.8 kg)  -  312 lb (141.5 kg)     Nutrition Goal  Epimenio want sto lose 100 pounds total. Try not to overeat.  Consider eating dinner at home once per week, slowly progressing to eating more meals at home over the course of the week which will help better control parameters like sodium, fat, portion sizes and total calories  Consider eating dinner at home once per week, slowly progressing to eating more meals at home over the course of the week which will help better control parameters like sodium, fat, portion sizes and total calories     Comment  Gaelen has lost 5  pounds and wants to lose at least 20 pounds before the program is over.  He and his wife currently eat out most if not all nights of the week. They order a variety of meals, some which are high in sodium and fat  He is eating two meals a week at home.  Ladarion has tried to slow down his eating.  They have started to take some his meals home versus eating the full portion when they go out.   He is eating more vegetables and more apples and oranges.      Expected Outcome  Short: lose 5 pounds within the next week. Long: lose 20 pounds by the end of [facility]  He will eat supper at home at least one additional meal per week  Short: Try to cook at home for 2-3 meals  Long: Continue to work on weight loss.        Personal Goal #2 Re-Evaluation   Personal Goal #2  -  Increase fluid intake throughout the day, aiming for 48-64oz total to help with fluid retention and thinning mucus  -       Personal Goal #3 Re-Evaluation   Personal Goal #3  -  Continue  to choose lower sodium options for snack foods. When eating out, use the guidelines provided to help you order foods that are lower in sodium and fat  -        Nutrition Goals Discharge (Final Nutrition Goals Re-Evaluation): Nutrition Goals Re-Evaluation - 04/24/18 1200      Goals   Current Weight  312 lb (141.5 kg)    Nutrition Goal  Consider eating dinner at home once per week, slowly progressing to eating more meals at home over the course of the week which will help better control parameters like sodium, fat, portion sizes and total calories    Comment  He is eating two meals a week at home.  Austan has tried to slow down his eating.  They have started to take some his meals home versus eating the full portion when they go out.   He is eating more vegetables and more apples and oranges.     Expected Outcome  Short: Try to cook at home for 2-3 meals  Long: Continue to work on weight loss.        Psychosocial: Target Goals: Acknowledge presence or absence of significant depression and/or stress, maximize coping skills, provide positive support system. Participant is able to verbalize types and ability to use techniques and skills needed for reducing stress and depression.   Initial Review & Psychosocial Screening: Initial Psych Review & Screening - 01/20/18 1437      Initial Review   Current issues with  Current Sleep Concerns;Current Stress Concerns    Source of Stress Concerns  Chronic Illness;Unable to perform yard/household activities    Comments  COPD is most stressful issue      Alfred Carney?  Yes    Comments  He can look to his wife and children for support.      Barriers   Psychosocial barriers to participate in program  The patient should benefit from training in stress management and relaxation.      Screening Interventions   Interventions  Encouraged to exercise;Program counselor consult;To provide support and resources with identified  psychosocial needs;Provide feedback about the scores to participant    Expected Outcomes  Short Term goal: Utilizing psychosocial counselor, staff and physician to assist with  identification of specific Stressors or current issues interfering with healing process. Setting desired goal for each stressor or current issue identified.;Long Term Goal: Stressors or current issues are controlled or eliminated.;Short Term goal: Identification and review with participant of any Quality of Life or Depression concerns found by scoring the questionnaire.;Long Term goal: The participant improves quality of Life and PHQ9 Scores as seen by post scores and/or verbalization of changes       Quality of Life Scores:  Scores of 19 and below usually indicate a poorer quality of life in these areas.  A difference of  2-3 points is a clinically meaningful difference.  A difference of 2-3 points in the total score of the Quality of Life Index has been associated with significant improvement in overall quality of life, self-image, physical symptoms, and general health in studies assessing change in quality of life.  PHQ-9: Recent Review Flowsheet Data    Depression screen [facility] 2/9 01/20/2018 01/19/2015 07/19/2014 05/31/2014 05/31/2014   Decreased Interest 1 0 0 0 0   Down, Depressed, Hopeless 0 0 0 0 0   PHQ - 2 Score 1 0 0 0 0   Altered sleeping 1 - - - -   Tired, decreased energy 2 - - - -   Change in appetite 0 - - - -   Feeling bad or failure about yourself  1 - - - -   Trouble concentrating 2 - - - -   Moving slowly or fidgety/restless 0 - - - -   Suicidal thoughts 0 - - - -   PHQ-9 Score 7 - - - -   Difficult doing work/chores Somewhat difficult - - - -     Interpretation of Total Score  Total Score Depression Severity:  1-4 = Minimal depression, 5-9 = Mild depression, 10-14 = Moderate depression, 15-19 = Moderately severe depression, 20-27 = Severe depression   Psychosocial Evaluation and  Intervention: Psychosocial Evaluation - 03/25/18 1230      Psychosocial Evaluation & Interventions   Interventions  Encouraged to exercise with the program and follow exercise prescription    Comments  Counselor met with [name]) today for initial psychosocial evaluation.  He is a 67 year old who struggles with COPD.  Alfred Carney has a strong support system with a spouse of almost 49 years; (4) adult children and a brother and dad all in [place].  He reports sleeping in a recliner most nights and gets maybe 7-8 hours of sleep.  Alfred Carney has a good appetite.  He has multiple health issues in addition to his pulmonary diagnosis; including diabetes; high blood pressure and cholesterol as well as obesity.  Jaycion reports having been diagnosed with depression at one point in his life, and took medications briefly - but denies any current symptoms and states he is typically in a positive mood.  Alfred Carney states his health is his primary stressor.  He has goals to lose weight; get off the oxygen and breathe better overall.   Alfred Carney has already noticed progress in these goals with loss of a few pounds and not having to sit down to catch his breath as often while doing normal activities.  Counselor commended Paton for his progress made.  Staff will follow.    Expected Outcomes  Short:  Alfred Carney will meet with the dietician to address his weight loss goals.   Long:  Nicholous will continue to make positive self-care choices in his diet and exercise long term.  Continue Psychosocial Services   Follow up required by staff       Psychosocial Re-Evaluation: Psychosocial Re-Evaluation    [place] Name 02/27/18 1158 04/24/18 1157           Psychosocial Re-Evaluation   Current issues with  Current Sleep Concerns;Current Stress Concerns  Current Sleep Concerns;Current Stress Concerns      Comments  Alfred Carney has been sleeping better since he has started exercising. He cannot tolerate his CPAP so he does not wear it. He is only stressed with his  COPD and not being able to breath at times.   Alfred Carney has been doing well mentally.  He continues to sleep better and his snoring has gotten better as he is losing weight.  He continues to sleep in the recliner.  He and his wife's health continue to be his biggest stressors as he doesn't  try to let things get to him.  His wife stresses enough for the both of them.       Expected Outcomes  Short: attend [facility] regularly to improve stress. Long: maintain exercise to keep stress at a minimum.  Short: Continue to stay postive.  Long: Continue to exercise to lose weight.       Interventions  Encouraged to attend Pulmonary Rehabilitation for the exercise  Encouraged to attend Pulmonary Rehabilitation for the exercise      Continue Psychosocial Services   Follow up required by staff  Follow up required by staff        Initial Review   Source of Stress Concerns  -  Chronic Illness;Unable to perform yard/household activities         Psychosocial Discharge (Final Psychosocial Re-Evaluation): Psychosocial Re-Evaluation - 04/24/18 1157      Psychosocial Re-Evaluation   Current issues with  Current Sleep Concerns;Current Stress Concerns    Comments  Alfred Carney has been doing well mentally.  He continues to sleep better and his snoring has gotten better as he is losing weight.  He continues to sleep in the recliner.  He and his wife's health continue to be his biggest stressors as he doesn't  try to let things get to him.  His wife stresses enough for the both of them.     Expected Outcomes  Short: Continue to stay postive.  Long: Continue to exercise to lose weight.     Interventions  Encouraged to attend Pulmonary Rehabilitation for the exercise    Continue Psychosocial Services   Follow up required by staff      Initial Review   Source of Stress Concerns  Chronic Illness;Unable to perform yard/household activities       Education: Education Goals: Education classes will be provided on a weekly basis,  covering required topics. Participant will state understanding/return demonstration of topics presented.  Learning Barriers/Preferences: Learning Barriers/Preferences - 01/20/18 1441      Learning Barriers/Preferences   Learning Barriers  Hearing    Learning Preferences  None       Education Topics:  Initial Evaluation Education: - Verbal, written and demonstration of respiratory meds, oximetry and breathing techniques. Instruction on use of nebulizers and MDIs and importance of monitoring MDI activations.   Pulmonary Rehab from 05/20/2018 in [facility] Cardiac and Pulmonary Rehab  Date  01/20/18  Educator  [facility]  Instruction Review Code  1- Verbalizes Understanding      General Nutrition Guidelines/Fats and Fiber: -Group instruction provided by verbal, written material, models and posters to present the general guidelines for heart healthy  nutrition. Gives an explanation and review of dietary fats and fiber.   Pulmonary Rehab from 05/20/2018 in [facility] Cardiac and Pulmonary Rehab  Date  03/30/18  Educator  CR  Instruction Review Code  1- Verbalizes Understanding      Controlling Sodium/Reading Food Labels: -Group verbal and written material supporting the discussion of sodium use in heart healthy nutrition. Review and explanation with models, verbal and written materials for utilization of the food label.   Pulmonary Rehab from 05/20/2018 in [facility] Cardiac and Pulmonary Rehab  Date  04/06/18  Educator  CR  Instruction Review Code  1- Verbalizes Understanding      Exercise Physiology & General Exercise Guidelines: - Group verbal and written instruction with models to review the exercise physiology of the cardiovascular system and associated critical values. Provides general exercise guidelines with specific guidelines to those with heart or lung disease.    Pulmonary Rehab from 05/20/2018 in [facility] Cardiac and Pulmonary Rehab  Date  03/06/18  Educator  [facility]  Instruction Review Code  1- Verbalizes  Understanding      Aerobic Exercise & Resistance Training: - Gives group verbal and written instruction on the various components of exercise. Focuses on aerobic and resistive training programs and the benefits of this training and how to safely progress through these programs.   Flexibility, Balance, Mind/Body Relaxation: Provides group verbal/written instruction on the benefits of flexibility and balance training, including mind/body exercise modes such as yoga, pilates and tai chi.  Demonstration and skill practice provided.   Pulmonary Rehab from 05/20/2018 in [facility] Cardiac and Pulmonary Rehab  Date  04/17/18  Educator  [facility]  Instruction Review Code  1- Verbalizes Understanding      Stress and Anxiety: - Provides group verbal and written instruction about the health risks of elevated stress and causes of high stress.  Discuss the correlation between heart/lung disease and anxiety and treatment options. Review healthy ways to manage with stress and anxiety.   Pulmonary Rehab from 05/20/2018 in [facility] Cardiac and Pulmonary Rehab  Date  04/22/18  Educator  [facility]  Instruction Review Code  1- Verbalizes Understanding      Depression: - Provides group verbal and written instruction on the correlation between heart/lung disease and depressed mood, treatment options, and the stigmas associated with seeking treatment.   Pulmonary Rehab from 05/20/2018 in [facility] Cardiac and Pulmonary Rehab  Date  03/25/18  Educator  [facility]  Instruction Review Code  1- Verbalizes Understanding      Exercise & Equipment Safety: - Individual verbal instruction and demonstration of equipment use and safety with use of the equipment.   Pulmonary Rehab from 05/20/2018 in [facility] Cardiac and Pulmonary Rehab  Date  01/20/18  Educator  [facility]  Instruction Review Code  1- Verbalizes Understanding      Infection Prevention: - Provides verbal and written material to individual with discussion of infection control including proper hand  washing and proper equipment cleaning during exercise session.   Pulmonary Rehab from 05/20/2018 in [facility] Cardiac and Pulmonary Rehab  Date  01/20/18  Educator  [facility]  Instruction Review Code  1- Verbalizes Understanding      Falls Prevention: - Provides verbal and written material to individual with discussion of falls prevention and safety.   Pulmonary Rehab from 05/20/2018 in [facility] Cardiac and Pulmonary Rehab  Date  01/20/18  Educator  [facility]  Instruction Review Code  1- Verbalizes Understanding      Diabetes: - Individual verbal and written instruction to  review signs/symptoms of diabetes, desired ranges of glucose level fasting, after meals and with exercise. Advice that pre and post exercise glucose checks will be done for 3 sessions at entry of program.   Chronic Lung Diseases: - Group verbal and written instruction to review updates, respiratory medications, advancements in procedures and treatments. Discuss use of supplemental oxygen including available portable oxygen systems, continuous and intermittent flow rates, concentrators, personal use and safety guidelines. Review proper use of inhaler and spacers. Provide informative websites for self-education.    Pulmonary Rehab from 05/20/2018 in [facility] Cardiac and Pulmonary Rehab  Date  03/04/18  Educator  [facility]  Instruction Review Code  1- Verbalizes Understanding      Energy Conservation: - Provide group verbal and written instruction for methods to conserve energy, plan and organize activities. Instruct on pacing techniques, use of adaptive equipment and posture/positioning to relieve shortness of breath.   Pulmonary Rehab from 05/20/2018 in [facility] Cardiac and Pulmonary Rehab  Date  02/04/18  Educator  [facility]  Instruction Review Code  1- Verbalizes Understanding      Triggers and Exacerbations: - Group verbal and written instruction to review types of environmental triggers and ways to prevent exacerbations. Discuss weather changes, air quality  and the benefits of nasal washing. Review warning signs and symptoms to help prevent infections. Discuss techniques for effective airway clearance, coughing, and vibrations.   Pulmonary Rehab from 05/20/2018 in [facility] Cardiac and Pulmonary Rehab  Date  03/20/18  Educator  [facility]  Instruction Review Code  1- Verbalizes Understanding      AED/CPR: - Group verbal and written instruction with the use of models to demonstrate the basic use of the AED with the basic ABC's of resuscitation.   Anatomy and Physiology of the Lungs: - Group verbal and written instruction with the use of models to provide basic lung anatomy and physiology related to function, structure and complications of lung disease.   Pulmonary Rehab from 05/20/2018 in [facility] Cardiac and Pulmonary Rehab  Date  05/13/18  Educator  [facility]  Instruction Review Code  1- Verbalizes Understanding      Anatomy & Physiology of the Heart: - Group verbal and written instruction and models provide basic cardiac anatomy and physiology, with the coronary electrical and arterial systems. Review of Valvular disease and Heart Failure   Pulmonary Rehab from 05/20/2018 in [facility] Cardiac and Pulmonary Rehab  Date  04/15/18  Educator  [facility]  Instruction Review Code  1- Verbalizes Understanding      Cardiac Medications: - Group verbal and written instruction to review commonly prescribed medications for heart disease. Reviews the medication, class of the drug, and side effects.   Know Your Numbers and Risk Factors: -Group verbal and written instruction about important numbers in your health.  Discussion of what are risk factors and how they play a role in the disease process.  Review of Cholesterol, Blood Pressure, Diabetes, and BMI and the role they play in your overall health.   Pulmonary Rehab from 05/20/2018 in [facility] Cardiac and Pulmonary Rehab  Date  03/18/18  Educator  [facility]  Instruction Review Code  1- Verbalizes Understanding      Sleep Hygiene: -Provides  group verbal and written instruction about how sleep can affect your health.  Define sleep hygiene, discuss sleep cycles and impact of sleep habits. Review good sleep hygiene tips.    Pulmonary Rehab from 05/20/2018 in [facility] Cardiac and Pulmonary Rehab  Date  05/20/18  Educator  [facility]  Instruction  Review Code  1- Verbalizes Understanding      Other: -Provides group and verbal instruction on various topics (see comments)    Knowledge Questionnaire Score: Knowledge Questionnaire Score - 04/24/18 1149      Knowledge Questionnaire Score   Pre Score  13/18    Post Score  18/18   reviewed with patient        Core Components/Risk Factors/Patient Goals at Admission: Personal Goals and Risk Factors at Admission - 01/20/18 1443      Core Components/Risk Factors/Patient Goals on Admission    Weight Management  Yes;Obesity;Weight Loss    Intervention  Weight Management: Develop a combined nutrition and exercise program designed to reach desired caloric intake, while maintaining appropriate intake of nutrient and fiber, sodium and fats, and appropriate energy expenditure required for the weight goal.;Weight Management: Provide education and appropriate resources to help participant work on and attain dietary goals.;Weight Management/Obesity: Establish reasonable short term and long term weight goals.    Admit Weight  318 lb 14.4 oz (144.7 kg)    Goal Weight: Short Term  313 lb (142 kg)    Goal Weight: Long Term  200 lb (90.7 kg)    Expected Outcomes  Short Term: Continue to assess and modify interventions until short term weight is achieved;Long Term: Adherence to nutrition and physical activity/exercise program aimed toward attainment of established weight goal;Weight Maintenance: Understanding of the daily nutrition guidelines, which includes 25-35% calories from fat, 7% or less cal from saturated fats, less than 223m cholesterol, less than 1.5gm of sodium, & 5 or more servings of fruits and  vegetables daily;Weight Loss: Understanding of general recommendations for a balanced deficit meal plan, which promotes 1-2 lb weight loss per week and includes a negative energy balance of [phone] kcal/d;Understanding recommendations for meals to include 15-35% energy as protein, 25-35% energy from fat, 35-60% energy from carbohydrates, less than 2017mof dietary cholesterol, 20-35 gm of total fiber daily;Understanding of distribution of calorie intake throughout the day with the consumption of 4-5 meals/snacks    Improve shortness of breath with ADL's  Yes    Intervention  Provide education, individualized exercise plan and daily activity instruction to help decrease symptoms of SOB with activities of daily living.    Expected Outcomes  Short Term: Improve cardiorespiratory fitness to achieve a reduction of symptoms when performing ADLs;Long Term: Be able to perform more ADLs without symptoms or delay the onset of symptoms    Diabetes  Yes    Intervention  Provide education about signs/symptoms and action to take for hypo/hyperglycemia.;Provide education about proper nutrition, including hydration, and aerobic/resistive exercise prescription along with prescribed medications to achieve blood glucose in normal ranges: Fasting glucose 65-99 mg/dL    Expected Outcomes  Short Term: Participant verbalizes understanding of the signs/symptoms and immediate care of hyper/hypoglycemia, proper foot care and importance of medication, aerobic/resistive exercise and nutrition plan for blood glucose control.;Long Term: Attainment of HbA1C < 7%.    Heart Failure  Yes    Intervention  Provide a combined exercise and nutrition program that is supplemented with education, support and counseling about heart failure. Directed toward relieving symptoms such as shortness of breath, decreased exercise tolerance, and extremity edema.    Expected Outcomes  Improve functional capacity of life;Short term: Attendance in program 2-3  days a week with increased exercise capacity. Reported lower sodium intake. Reported increased fruit and vegetable intake. Reports medication compliance.;Long term: Adoption of self-care skills and reduction of barriers for early  signs and symptoms recognition and intervention leading to self-care maintenance.;Short term: Daily weights obtained and reported for increase. Utilizing diuretic protocols set by physician.    Hypertension  Yes    Intervention  Provide education on lifestyle modifcations including regular physical activity/exercise, weight management, moderate sodium restriction and increased consumption of fresh fruit, vegetables, and low fat dairy, alcohol moderation, and smoking cessation.;Monitor prescription use compliance.    Expected Outcomes  Short Term: Continued assessment and intervention until BP is < 140/93m HG in hypertensive participants. < 130/893mHG in hypertensive participants with diabetes, heart failure or chronic kidney disease.;Long Term: Maintenance of blood pressure at goal levels.    Lipids  Yes    Intervention  Provide education and support for participant on nutrition & aerobic/resistive exercise along with prescribed medications to achieve LDL <7053mHDL >47m4m  Expected Outcomes  Long Term: Cholesterol controlled with medications as prescribed, with individualized exercise RX and with personalized nutrition plan. Value goals: LDL < 70mg54mL > 40 mg.;Short Term: Participant states understanding of desired cholesterol values and is compliant with medications prescribed. Participant is following exercise prescription and nutrition guidelines.       Core Components/Risk Factors/Patient Goals Review:  Goals and Risk Factor Review    Row Name 02/27/18 1144 04/24/18 1205           Core Components/Risk Factors/Patient Goals Review   Personal Goals Review  Weight Management/Obesity;Diabetes;Hypertension;Lipids;Heart Failure;Improve shortness of breath with ADL's   Weight Management/Obesity;Diabetes;Hypertension;Lipids;Heart Failure;Improve shortness of breath with ADL's      Review  Alfred Carney 5 pounds since starting the program. He wants to continue to lose weight. His blood pressure has been stable and within normal limits. He takes blood pressure medication twice a day. He has not had his lipids checked in about six months. He is due for a check up on his lipids soon. Alfred Carney can walk to the car now without getting too short of breath.   Alfred Carney continues to lose weight.  We talked about getting in more home exercise and adding in intervals on seated equipment to help with weight loss.  He does not check his blood sugars at home.  He is doing well with his blood pressures.  He does not have any symptoms of his heart failure.        Expected Outcomes  Short: work on weight loss and get lipids checked. Long: lose 20 pounds.  Short: Continue to work on weight loss.  Long: Continue to monitor risk factors.          Core Components/Risk Factors/Patient Goals at Discharge (Final Review):  Goals and Risk Factor Review - 04/24/18 1205      Core Components/Risk Factors/Patient Goals Review   Personal Goals Review  Weight Management/Obesity;Diabetes;Hypertension;Lipids;Heart Failure;Improve shortness of breath with ADL's    Review  Alfred Carney Vonninues to lose weight.  We talked about getting in more home exercise and adding in intervals on seated equipment to help with weight loss.  He does not check his blood sugars at home.  He is doing well with his blood pressures.  He does not have any symptoms of his heart failure.      Expected Outcomes  Short: Continue to work on weight loss.  Long: Continue to monitor risk factors.        ITP Comments: ITP Comments    Row Name 01/20/18 1414 02/16/18 0840 03/16/18 1527 04/13/18 0904 04/27/18 1519   ITP Comments  Medical Evaluation  completed. Chart sent for review and changes to Dr. Emily Filbert Director of [place]. Diagnosis can  be found in [facility] encounter 01/05/18   30 day review completed. ITP sent to Dr. Emily Filbert Director of [place]. Continue with ITP unless changes are made by physician   30 day review completed. ITP sent to Dr. Emily Filbert Director of [place]. Continue with ITP unless changes are made by physician   30 day review completed. ITP sent to Dr. Emily Filbert Director of [place]. Continue with ITP unless changes are made by physician  [place] fell while in the midst of switching exercise stations.  He stated his "leg gave out".  This has happened in the past.  He did not feel he was injured.  He was able to complete the rest of the session.   [place] Name 05/01/18 1153 05/04/18 0925 05/06/18 1421 05/11/18 0839     ITP Comments  Johns foot is hurt  - he feels he injured it when he fell.  He has been icing it at home and it is somewhat better.  He hasn't called Dr. I recommended he go to urgent care to get an evaluation.  Aydan called to say he had an xray on his ankle. He stated that the doctors said they couldn't tell if it was an old injury or a new injury. He is to stay off it for a couple days. He will be back to [facility] once he heals.  Called patient to follow up after a fall. He states that the doctors could not verify if he had new or old scarring or fracture in his left foot. He still has a bit of pain and is taking it easy. He plans to return to [place] on Monday.  30 day review completed. ITP sent to Dr. Emily Filbert Director of [place]. Continue with ITP unless changes are made by physician       Comments: Discharge ITP

## 2018-05-22 NOTE — Progress Notes (Signed)
Daily Session Note  Patient Details  Name: Alfred Carney MRN: [id number] Date of Birth: 01-27-1951 Referring Provider:     Pulmonary Rehab from 01/20/2018 in [facility] Cardiac and Pulmonary Rehab  Referring Provider  Christinia Gully MD      Encounter Date: 05/22/2018  Check In: Session Check In - 05/22/18 1134      Check-In   Supervising physician immediately available to respond to emergencies  [facility] immediately available ER MD    Physician(s)  Dr. Corky Downs and [facility]    Location  [facility]-Cardiac & Pulmonary Rehab    Staff Present  Alberteen Sam, MA, RCEP, CCRP, Exercise Physiologist;Alpha Chouinard Sherryll Burger, RN BSN;Joseph Hood RCP,RRT,BSRT    Medication changes reported      No    Fall or balance concerns reported     No    Warm-up and Cool-down  Performed as group-led Higher education careers adviser Performed  Yes    VAD Patient?  No    PAD/SET Patient?  No      Pain Assessment   Currently in Pain?  No/denies          Social History   Tobacco Use  Smoking Status Former Smoker  . Packs/day: 1.00  . Years: 35.00  . Pack years: 35.00  . Types: Cigarettes  . Last attempt to quit: 09/16/2006  . Years since quitting: 11.6  Smokeless Tobacco Never Used    Goals Met:  Proper associated with RPD/PD & O2 Sat Independence with exercise equipment Using PLB without cueing & demonstrates good technique Exercise tolerated well No report of cardiac concerns or symptoms Strength training completed today  Goals Unmet:  Not Applicable  Comments:  Alfred Carney graduated today from  rehab with 36 sessions completed.  Details of the patient's exercise prescription and what He needs to do in order to continue the prescription and progress were discussed with patient.  Patient was given a copy of prescription and goals.  Patient verbalized understanding.  Alfred Carney plans to continue to exercise by attending [employer].    Dr. Emily Filbert is Medical Director for [place] and  [facility].

## 2018-05-22 NOTE — Progress Notes (Signed)
Discharge Progress Report  Patient Details  Name: Alfred Carney MRN: [id number] Date of Birth: [birth year] Referring Provider:     Pulmonary Rehab from 01/20/2018 in [facility]  Referring Provider  Christinia Gully MD       Number of Visits: 36  Reason for Discharge:  Patient reached a stable level of exercise. Patient independent in their exercise. Patient has met program and personal goals.  Smoking History:  Social History   Tobacco Use  Smoking Status Former Smoker  . Packs/day: 1.00  . Years: 35.00  . Pack years: 35.00  . Types: Cigarettes  . Last attempt to quit: 09/16/2006  . Years since quitting: 11.6  Smokeless Tobacco Never Used    Diagnosis:  Chronic obstructive pulmonary disease, unspecified COPD type ([place])  ADL [facility]: Pulmonary Assessment Scores    Row Name 01/20/18 1502 04/24/18 1148 05/13/18 1218     ADL [facility]   ADL Phase  Entry  Exit  Exit   SOB Score total  88  70  70   Rest  1  0  0   Walk  4  3  3    Stairs  4  4  4    Bath  5  3  3    Dress  3  2  2    Shop  5  5  5      CAT Score   CAT Score  23  17  17      mMRC Score   mMRC Score  4  -  3      Initial Exercise Prescription: Initial Exercise Prescription - 01/20/18 1500      Date of Initial Exercise RX and Referring Provider   Date  01/20/18    Referring Provider  Christinia Gully MD      Oxygen   Oxygen  Continuous    Liters  4      Treadmill   MPH  0.8    Grade  0    Minutes  15    METs  1.6      T5 Nustep   Level  1    SPM  80    Minutes  15    METs  1.5      Biostep-RELP   Level  1    SPM  40    Minutes  15    METs  2      Prescription Details   Frequency (times per week)  3    Duration  Progress to 45 minutes of aerobic exercise without signs/symptoms of physical distress      Intensity   THRR 40-80% of Max Heartrate  103-136    Ratings of Perceived Exertion  11-13    Perceived Dyspnea  0-4      Progression   Progression  Continue to progress  workloads to maintain intensity without signs/symptoms of physical distress.      Resistance Training   Training Prescription  Yes    Weight  3 lbs    Reps  10-15       Discharge Exercise Prescription (Final Exercise Prescription Changes): Exercise Prescription Changes - 05/11/18 1500      Response to Exercise   Blood Pressure (Admit)  136/74    Blood Pressure (Exit)  114/60    Heart Rate (Admit)  67 bpm    Heart Rate (Exercise)  96 bpm    Heart Rate (Exit)  76 bpm    Oxygen Saturation (Admit)  95 %    Oxygen Saturation (Exercise)  90 %    Oxygen Saturation (Exit)  92 %    Rating of Perceived Exertion (Exercise)  13    Perceived Dyspnea (Exercise)  3    Symptoms  SOB    Duration  Continue with 45 min of aerobic exercise without signs/symptoms of physical distress.    Intensity  THRR unchanged      Progression   Progression  Continue to progress workloads to maintain intensity without signs/symptoms of physical distress.    Average METs  2.29      Resistance Training   Training Prescription  Yes    Weight  5 lbs    Reps  10-15      Interval Training   Interval Training  No      Oxygen   Oxygen  Continuous    Liters  3      Treadmill   MPH  1    Grade  0    Minutes  15    METs  1.77      T5 Nustep   Level  3    Minutes  15    METs  3.1      Biostep-RELP   Level  7    Minutes  15    METs  2      Home Exercise Plan   Plans to continue exercise at  Home (comment)   walking, chair exercises   Frequency  Add 2 additional days to program exercise sessions.    Initial Home Exercises Provided  02/25/18       Functional Capacity: 6 Minute Walk    Row Name 01/20/18 1526 05/13/18 1213       6 Minute Walk   Phase  Initial  Discharge    Distance  142 feet  660 feet    Distance % Change  -  364.8 %    Distance Feet Change  -  518 ft    Walk Time  1.05 minutes test terminated due to desaturation  4.5 minutes    # of Rest Breaks  0  1 1:30    MPH  1.54   1.67    METS  0.09  0.82    RPE  13  15    Perceived Dyspnea   2  3.5    VO2 Peak  0.32  2.87    Symptoms  Yes (comment)  Yes (comment)    Comments  SOB, uses cane for balance  SOB, using cane    Resting HR  70 bpm  60 bpm    Resting BP  136/64  122/66    Resting Oxygen Saturation   90 %  90 %    Exercise Oxygen Saturation  during 6 min walk  73 %  81 %    Max Ex. HR  100 bpm  97 bpm    Max Ex. BP  168/74  142/64    2 Minute Post BP  156/70 140/74  134/66      Interval HR   1 Minute HR  100  83    2 Minute HR  99 test stopped at 1:03  -    3 Minute HR  91  86    4 Minute HR  77  95    5 Minute HR  -  97    6 Minute HR  68  96    2 Minute Post HR  -  86    Interval Heart Rate?  Yes  Yes      Interval Oxygen   Interval Oxygen?  Yes  Yes    Baseline Oxygen Saturation %  90 %  90 %    1 Minute Oxygen Saturation %  80 % test terminated at 1:03 73%  89 %    1 Minute Liters of Oxygen  2 L continuous  2 L continuos    2 Minute Oxygen Saturation %  87 %  89 %    2 Minute Liters of Oxygen  2 L  2 L    3 Minute Oxygen Saturation %  -  82 % rest break 3:16-4:31    3 Minute Liters of Oxygen  -  2 L    4 Minute Oxygen Saturation %  95 %  84 %    4 Minute Liters of Oxygen  2 L  2 L    5 Minute Oxygen Saturation %  -  85 %    5 Minute Liters of Oxygen  -  2 L    6 Minute Oxygen Saturation %  94 %  81 %    6 Minute Liters of Oxygen  2 L  2 L    2 Minute Post Oxygen Saturation %  -  95 %    2 Minute Post Liters of Oxygen  -  2 L       Psychological, QOL, Others - Outcomes: PHQ 2/9: Depression screen [facility] 2/9 01/20/2018 01/19/2015 07/19/2014 05/31/2014 05/31/2014  Decreased Interest 1 0 0 0 0  Down, Depressed, Hopeless 0 0 0 0 0  PHQ - 2 Score 1 0 0 0 0  Altered sleeping 1 - - - -  Tired, decreased energy 2 - - - -  Change in appetite 0 - - - -  Feeling bad or failure about yourself  1 - - - -  Trouble concentrating 2 - - - -  Moving slowly or fidgety/restless 0 - - - -  Suicidal  thoughts 0 - - - -  PHQ-9 Score 7 - - - -  Difficult doing work/chores Somewhat difficult - - - -    Quality of Life:   Personal Goals: Goals established at orientation with interventions provided to work toward goal. Personal Goals and Risk Factors at Admission - 01/20/18 1443      Core Components/Risk Factors/Patient Goals on Admission    Weight Management  Yes;Obesity;Weight Loss    Intervention  Weight Management: Develop a combined nutrition and exercise program designed to reach desired caloric intake, while maintaining appropriate intake of nutrient and fiber, sodium and fats, and appropriate energy expenditure required for the weight goal.;Weight Management: Provide education and appropriate resources to help participant work on and attain dietary goals.;Weight Management/Obesity: Establish reasonable short term and long term weight goals.    Admit Weight  318 lb 14.4 oz (144.7 kg)    Goal Weight: Short Term  313 lb (142 kg)    Goal Weight: Long Term  200 lb (90.7 kg)    Expected Outcomes  Short Term: Continue to assess and modify interventions until short term weight is achieved;Long Term: Adherence to nutrition and physical activity/exercise program aimed toward attainment of established weight goal;Weight Maintenance: Understanding of the daily nutrition guidelines, which includes 25-35% calories from fat, 7% or less cal from saturated fats, less than 272m cholesterol, less than 1.5gm of sodium, & 5 or more servings of fruits and vegetables daily;Weight Loss:  Understanding of general recommendations for a balanced deficit meal plan, which promotes 1-2 lb weight loss per week and includes a negative energy balance of [phone] kcal/d;Understanding recommendations for meals to include 15-35% energy as protein, 25-35% energy from fat, 35-60% energy from carbohydrates, less than 235m of dietary cholesterol, 20-35 gm of total fiber daily;Understanding of distribution of calorie intake  throughout the day with the consumption of 4-5 meals/snacks    Improve shortness of breath with ADL's  Yes    Intervention  Provide education, individualized exercise plan and daily activity instruction to help decrease symptoms of SOB with activities of daily living.    Expected Outcomes  Short Term: Improve cardiorespiratory fitness to achieve a reduction of symptoms when performing ADLs;Long Term: Be able to perform more ADLs without symptoms or delay the onset of symptoms    Diabetes  Yes    Intervention  Provide education about signs/symptoms and action to take for hypo/hyperglycemia.;Provide education about proper nutrition, including hydration, and aerobic/resistive exercise prescription along with prescribed medications to achieve blood glucose in normal ranges: Fasting glucose 65-99 mg/dL    Expected Outcomes  Short Term: Participant verbalizes understanding of the signs/symptoms and immediate care of hyper/hypoglycemia, proper foot care and importance of medication, aerobic/resistive exercise and nutrition plan for blood glucose control.;Long Term: Attainment of HbA1C < 7%.    Heart Failure  Yes    Intervention  Provide a combined exercise and nutrition program that is supplemented with education, support and counseling about heart failure. Directed toward relieving symptoms such as shortness of breath, decreased exercise tolerance, and extremity edema.    Expected Outcomes  Improve functional capacity of life;Short term: Attendance in program 2-3 days a week with increased exercise capacity. Reported lower sodium intake. Reported increased fruit and vegetable intake. Reports medication compliance.;Long term: Adoption of self-care skills and reduction of barriers for early signs and symptoms recognition and intervention leading to self-care maintenance.;Short term: Daily weights obtained and reported for increase. Utilizing diuretic protocols set by physician.    Hypertension  Yes     Intervention  Provide education on lifestyle modifcations including regular physical activity/exercise, weight management, moderate sodium restriction and increased consumption of fresh fruit, vegetables, and low fat dairy, alcohol moderation, and smoking cessation.;Monitor prescription use compliance.    Expected Outcomes  Short Term: Continued assessment and intervention until BP is < 140/921mHG in hypertensive participants. < 130/8013mG in hypertensive participants with diabetes, heart failure or chronic kidney disease.;Long Term: Maintenance of blood pressure at goal levels.    Lipids  Yes    Intervention  Provide education and support for participant on nutrition & aerobic/resistive exercise along with prescribed medications to achieve LDL <1m19mDL >40mg38m Expected Outcomes  Long Term: Cholesterol controlled with medications as prescribed, with individualized exercise RX and with personalized nutrition plan. Value goals: LDL < 1mg,32m > 40 mg.;Short Term: Participant states understanding of desired cholesterol values and is compliant with medications prescribed. Participant is following exercise prescription and nutrition guidelines.        Personal Goals Discharge: Goals and Risk Factor Review    Row Name 02/27/18 1144 04/24/18 1205           Core Components/Risk Factors/Patient Goals Review   Personal Goals Review  Weight Management/Obesity;Diabetes;Hypertension;Lipids;Heart Failure;Improve shortness of breath with ADL's  Weight Management/Obesity;Diabetes;Hypertension;Lipids;Heart Failure;Improve shortness of breath with ADL's      Review  Adriaan hTytanose 5 pounds since starting the program. He wants to  continue to lose weight. His blood pressure has been stable and within normal limits. He takes blood pressure medication twice a day. He has not had his lipids checked in about six months. He is due for a check up on his lipids soon. Breylin can walk to the car now without getting too  short of breath.   Roland continues to lose weight.  We talked about getting in more home exercise and adding in intervals on seated equipment to help with weight loss.  He does not check his blood sugars at home.  He is doing well with his blood pressures.  He does not have any symptoms of his heart failure.        Expected Outcomes  Short: work on weight loss and get lipids checked. Long: lose 20 pounds.  Short: Continue to work on weight loss.  Long: Continue to monitor risk factors.          Exercise Goals and Review: Exercise Goals    Row Name 01/20/18 1539             Exercise Goals   Increase Physical Activity  Yes       Intervention  Provide advice, education, support and counseling about physical activity/exercise needs.;Develop an individualized exercise prescription for aerobic and resistive training based on initial evaluation findings, risk stratification, comorbidities and participant's personal goals.       Expected Outcomes  Short Term: Attend rehab on a regular basis to increase amount of physical activity.;Long Term: Add in home exercise to make exercise part of routine and to increase amount of physical activity.;Long Term: Exercising regularly at least 3-5 days a week.       Increase Strength and Stamina  Yes       Intervention  Provide advice, education, support and counseling about physical activity/exercise needs.;Develop an individualized exercise prescription for aerobic and resistive training based on initial evaluation findings, risk stratification, comorbidities and participant's personal goals.       Expected Outcomes  Short Term: Increase workloads from initial exercise prescription for resistance, speed, and METs.;Short Term: Perform resistance training exercises routinely during rehab and add in resistance training at home;Long Term: Improve cardiorespiratory fitness, muscular endurance and strength as measured by increased METs and functional capacity (6MWT)        Able to understand and use rate of perceived exertion (RPE) scale  Yes       Intervention  Provide education and explanation on how to use RPE scale       Expected Outcomes  Short Term: Able to use RPE daily in rehab to express subjective intensity level;Long Term:  Able to use RPE to guide intensity level when exercising independently       Able to understand and use Dyspnea scale  Yes       Intervention  Provide education and explanation on how to use Dyspnea scale       Expected Outcomes  Short Term: Able to use Dyspnea scale daily in rehab to express subjective sense of shortness of breath during exertion;Long Term: Able to use Dyspnea scale to guide intensity level when exercising independently       Knowledge and understanding of Target Heart Rate Range (THRR)  Yes       Intervention  Provide education and explanation of THRR including how the numbers were predicted and where they are located for reference       Expected Outcomes  Short Term: Able to state/look up THRR;Short  Term: Able to use daily as guideline for intensity in rehab;Long Term: Able to use THRR to govern intensity when exercising independently       Able to check pulse independently  Yes       Intervention  Provide education and demonstration on how to check pulse in carotid and radial arteries.;Review the importance of being able to check your own pulse for safety during independent exercise       Expected Outcomes  Short Term: Able to explain why pulse checking is important during independent exercise;Long Term: Able to check pulse independently and accurately       Understanding of Exercise Prescription  Yes       Intervention  Provide education, explanation, and written materials on patient's individual exercise prescription       Expected Outcomes  Short Term: Able to explain program exercise prescription;Long Term: Able to explain home exercise prescription to exercise independently          Nutrition & Weight -  Outcomes: Pre Biometrics - 01/20/18 1540      Pre Biometrics   Height  5' 9"  (1.753 m)    Weight  (!) 318 lb 14.4 oz (144.7 kg)    Waist Circumference  56.5 inches    Hip Circumference  46.5 inches    Waist to Hip Ratio  1.22 %    BMI (Calculated)  47.07    Single Leg Stand  1.93 seconds      Post Biometrics - 05/13/18 1218       Post  Biometrics   Height  5' 9"  (1.753 m)    Weight  (!) 313 lb 3.2 oz (142.1 kg)    Waist Circumference  54 inches    Hip Circumference  46 inches    Waist to Hip Ratio  1.17 %    BMI (Calculated)  46.23    Single Leg Stand  1.03 seconds       Nutrition: Nutrition Therapy & Goals - 03/04/18 1055      Nutrition Therapy   Diet  DM/ TLC    Drug/Food Interactions  Purine/Gout    Protein (specify units)  13oz    Fiber  30 grams    Whole Grain Foods  3 servings    Saturated Fats  16 max. grams    Fruits and Vegetables  5 servings/day   8 ideal. eats 2 meals per day currently   Sodium  1500 grams      Personal Nutrition Goals   Nutrition Goal  Increase fluid intake throughout the day, aiming for 48-64oz total to help with fluid retention and thinning mucus   Currently drinks 2 bottles of water per day plus 1-3 diet sodas   Personal Goal #2  Continue to choose lower sodium options for snack foods. When eating out, use the guidelines provided to help you order foods that are lower in sodium and fat    Personal Goal #3  Consider eating dinner at home once per week, slowly progressing to eating more meals at home over the course of the week which will help better control parameters like sodium, fat, portion sizes and total calories    Comments  He and his wife currently eat out most if not all nights of the week. He does not eat lunch but he does try to limit dietary sources of added sugar and sodium. He has also been working to reduce his portion sizes and to choose more vegetables at supper  Intervention Plan   Intervention  Nutrition handout(s)  given to patient.;Prescribe, educate and counsel regarding individualized specific dietary modifications aiming towards targeted core components such as weight, hypertension, lipid management, diabetes, heart failure and other comorbidities.   COPD handout, Eating out handout, Low sodium nutrition therapy handout   Expected Outcomes  Short Term Goal: Understand basic principles of dietary content, such as calories, fat, sodium, cholesterol and nutrients.;Short Term Goal: A plan has been developed with personal nutrition goals set during dietitian appointment.;Long Term Goal: Adherence to prescribed nutrition plan.       Nutrition Discharge: Nutrition Assessments - 04/24/18 1151      MEDFICTS Scores   Post Score  90       Education Questionnaire Score: Knowledge Questionnaire Score - 04/24/18 1149      Knowledge Questionnaire Score   Pre Score  13/18    Post Score  18/18   reviewed with patient       Goals reviewed with patient; copy given to patient.

## 2018-05-23 DIAGNOSIS — J449 Chronic obstructive pulmonary disease, unspecified: Secondary | ICD-10-CM | POA: Diagnosis not present

## 2018-05-23 DIAGNOSIS — R0602 Shortness of breath: Secondary | ICD-10-CM | POA: Diagnosis not present

## 2018-06-17 ENCOUNTER — Other Ambulatory Visit: Payer: Self-pay | Admitting: Internal Medicine

## 2018-06-17 DIAGNOSIS — R918 Other nonspecific abnormal finding of lung field: Secondary | ICD-10-CM

## 2018-06-17 DIAGNOSIS — R911 Solitary pulmonary nodule: Secondary | ICD-10-CM

## 2018-06-22 DIAGNOSIS — R0602 Shortness of breath: Secondary | ICD-10-CM | POA: Diagnosis not present

## 2018-06-22 DIAGNOSIS — J449 Chronic obstructive pulmonary disease, unspecified: Secondary | ICD-10-CM | POA: Diagnosis not present

## 2018-07-23 DIAGNOSIS — J449 Chronic obstructive pulmonary disease, unspecified: Secondary | ICD-10-CM | POA: Diagnosis not present

## 2018-07-23 DIAGNOSIS — R0602 Shortness of breath: Secondary | ICD-10-CM | POA: Diagnosis not present

## 2018-07-28 DIAGNOSIS — E1159 Type 2 diabetes mellitus with other circulatory complications: Secondary | ICD-10-CM | POA: Diagnosis not present

## 2018-07-28 DIAGNOSIS — I10 Essential (primary) hypertension: Secondary | ICD-10-CM | POA: Diagnosis not present

## 2018-07-28 DIAGNOSIS — E1169 Type 2 diabetes mellitus with other specified complication: Secondary | ICD-10-CM | POA: Diagnosis not present

## 2018-07-28 DIAGNOSIS — E785 Hyperlipidemia, unspecified: Secondary | ICD-10-CM | POA: Diagnosis not present

## 2018-07-29 DIAGNOSIS — R69 Illness, unspecified: Secondary | ICD-10-CM | POA: Diagnosis not present

## 2018-08-05 ENCOUNTER — Ambulatory Visit (INDEPENDENT_AMBULATORY_CARE_PROVIDER_SITE_OTHER)
Admission: RE | Admit: 2018-08-05 | Discharge: 2018-08-05 | Disposition: A | Discharge status: Home / Self Care | Payer: Medicare HMO | Source: Ambulatory Visit | Attending: Internal Medicine | Admitting: Internal Medicine

## 2018-08-05 DIAGNOSIS — R911 Solitary pulmonary nodule: Secondary | ICD-10-CM | POA: Diagnosis not present

## 2018-08-05 DIAGNOSIS — R918 Other nonspecific abnormal finding of lung field: Secondary | ICD-10-CM

## 2018-08-06 ENCOUNTER — Ambulatory Visit: Payer: Medicare HMO | Admitting: Internal Medicine

## 2018-08-06 ENCOUNTER — Encounter: Payer: Self-pay | Admitting: Internal Medicine

## 2018-08-06 DIAGNOSIS — J449 Chronic obstructive pulmonary disease, unspecified: Secondary | ICD-10-CM | POA: Diagnosis not present

## 2018-08-06 DIAGNOSIS — J9611 Chronic respiratory failure with hypoxia: Secondary | ICD-10-CM

## 2018-08-06 DIAGNOSIS — R911 Solitary pulmonary nodule: Secondary | ICD-10-CM

## 2018-08-06 NOTE — Progress Notes (Signed)
Subjective:   Patient ID: Alfred Carney, male    DOB: [birth year]    MRN: [id number]   Brief patient profile:  54  yowm quit smoking 2008 with baseline weight of 240 when he stopped smoking in March of 2008 p pna with doe only some better then  worse since summer 2015 on so referred 05/17/2015 to pulmonary clinic by Dr Helane Rima for copd eval as had been seen here in 2012 with GOLD II criteria.     History of Present Illness 05/17/2015 1st [facility] office visit/ Alfred Carney  / copd eval on ACEi  Chief Complaint  Patient presents with  . Pulmonary Consult    Referre by Dr. Helane Rima. Pt c/o SOB x 10 yrs, gradually worse over the past yr.  He states that he gets SOB with minimal exertion such as taking a shower or walking 50 yrds on flat surface.   indolent onset progressive doe but always ok at rest/  Sleeps in recliner x 10 years Has osa/ could not tol cpap and tends to fall asleep p eats Assoc with sense of nasal and throat congestion but very little cough - does have hb but controls with prn ppi Had been on several different inhalers including according to the records Spiriva but didn't think any of them really helped and stopped them years ago rec Stop lisinopril  Valsartan 80 mg one daily in place of lisinopril Prilosec Take 30- 60 min before your first and last meals of the day just until you return  GERD  Diet     06/28/2015  f/u ov/Emersyn Kotarski re: GOLD II copd/ obesity Chief Complaint  Patient presents with  . Follow-up    PFT done today. Pt states his breathing is unchanged. He c/o cough for the past month- prod with min clear sputum.    Was some better until caught cold x 2 weeks p serving jury duty Has not tried sleeping s recliner  rec Stop hydrodiuril (thiazides contribute to gout)  Start lasix 20 mg daily as needed for swelling  - if you cut the sal out you may not need this  Symbicort 160 Take 2 puffs first thing in am and then another 2 puffs about 12 hours later.       08/15/2015  f/u ov/Lashondra Vaquerano re: uacs/ copd GOLD II/ symptoms better on ppi bid ac  Chief Complaint  Patient presents with  . Follow-up    pt following for COPD: pt states hes is doing pretty well since he has last been here. pt c/o some wheezing and SOB with exertion but states it is getting better. no c/o of cough or chest tightness.  pt states he stopped taking the symbicort he felt worse when he was on it.    Really Not limited by breathing from desired activities   rec Weight control is simply a matter of calorie balance   Think of your calorie balance like you do your bank account where in this case you want the balance to go down so you must take in less calories than you burn up.  It's just that simple:  Hard to do, but easy to understand.  Good luck!  You do not have significant limiting  copd and unlikely you ever will - pulmonary follow up is as needed     11/19/2017  Extended f/u ov/Alfred Carney re-establish for  worse sob x 3 y  Chief Complaint  Patient presents with  . Follow-up    SOB  that has not gotten any better since last OV .  Has a CPAP  but unable to tolerate .   indolent gradually worsening Dyspnea: 50 ft to car down 6 steps from the deck and struggles to back x 3-4 months Cough: none Sleep: poor/ restlessness/ recliner but barely elevated / has cpap but can't tol at all  SABA use:  Not using one now  Not using prilosec regularly  rec stiolto 2 pffs each am  Wear 02 2lpm 24/7 for now Please remember to go to the lab and x-ray department downstairs in the basement  for your tests - we will call you with the results when they are available.   Please schedule a follow up office visit in 6 weeks, call sooner if needed with all medications /inhalers/ solutions in hand so we can verify exactly what you are taking. This includes all medications from all doctors and over the counters  - full pfts on return    01/05/2018  f/u ov/Alfred Carney re:   COPD II 02 2 lpm - no better with  stiolto so did not fill  / did not bring meds  Chief Complaint  Patient presents with  . Follow-up    PFT's done today.  Breathing is unchanged since the last visit.    Dyspnea:  MMRC3 = can't walk 100 yards even at a slow pace at a flat grade s stopping due to sob   Cough: none Sleep: recliner < 30 degrees due to back and breathing SABA use:  Not helping   Overt hb on ppi bid ac > for EGD by Dr Nyoka Cowden rec Only use your Albuterol as rescue  Please see patient coordinator before you leave today  to schedule pulmonary rehab at [place]> done        02/02/2018  f/u ov/Alfred Carney re:  Copd II/ 02 2lpm  Chief Complaint  Patient presents with  . Follow-up    Breathing is about the same. He has good and bad days. He is using his ventolin inhaler 1-2 x per wk on average.    Dyspnea:  MMRC3 = can't walk 100 yards even at a slow pace at a flat grade s stopping due to sob  4 min x  .28 miles /level  Stopped half way due to legs  Cough: none Sleep: recliner due to back > breathing rec Omeprazole 40 mg Take 30-60 min before first meal of the day  Add anoro one click each am - fill it if you feel it improves your activity tolerance especially at rehab       05/06/2018  f/u ov/Alfred Carney re:  Copd II/  2lpm ex up to 4lpm was doing fine with rehab until fell/ now in w/c/ no better on anoro so stopped it  Chief Complaint  Patient presents with  . Follow-up    c/o stable sob with exertion.    Dyspnea:  15 min on treadmill @ 4lpm @ 0.8 up to 1.0 flat then fell 04/27/18  Cough: no Sleeping: recliner 30 degrees/  SABA use: not really using much, no better on anoro so stopped 02: 2lpm   rec The goal is to  Keep the saturations above 90% when you exercise  Have your pcp refer you to orthopedics if needed  Keep the legs elevated at night as much as you can and consider wearing elastic knee highs during the day     08/06/2018  f/u ov/Alfred Carney re:  COPD II/ 02 dep with MO /  spn LLL  Chief Complaint    Patient presents with  . Follow-up    headache and head cold for 5 days and "knots in scalp" (saw his PCP).  breathing seems to be better.  O2 at 2L  Dyspnea:  20 min @ l mph flat grade on 3lpm at [employer]  Cough: assoc with nasal congestion/ clear mucus Sleeping: recliner  At 30 degerees  SABA use: none 02:  2lpm / 3lpm with exertion      No obvious day to day or daytime variability or assoc excess/ purulent sputum or mucus plugs or hemoptysis or cp or chest tightness, subjective wheeze or overt sinus or hb symptoms.   Sleeping as above  without nocturnal  or early am exacerbation  of respiratory  c/o's or need for noct saba. Also denies any obvious fluctuation of symptoms with weather or environmental changes or other aggravating or alleviating factors except as outlined above   No unusual exposure hx or h/o childhood pna/ asthma or knowledge of premature birth.  Current Allergies, Complete Past Medical History, Past Surgical History, Family History, and Social History were reviewed in [employer] record.  ROS  The following are not active complaints unless bolded Hoarseness, sore throat, dysphagia, dental problems, itching, sneezing,  nasal congestion or discharge of excess mucus or purulent secretions, ear ache,   fever, chills, sweats, unintended wt loss or wt gain, classically pleuritic or exertional cp,  orthopnea pnd or arm/hand swelling  or leg swelling, presyncope, palpitations, abdominal pain, anorexia, nausea, vomiting, diarrhea  or change in bowel habits or change in bladder habits, change in stools or change in urine, dysuria, hematuria,  Rash over post scalp arthralgias, visual complaints, headache, numbness, weakness or ataxia or problems with walking or coordination,  change in mood or  memory.        Current Meds  Medication Sig  . albuterol (PROVENTIL HFA;VENTOLIN HFA) 108 (90 Base) MCG/ACT inhaler Inhale 2 puffs into the lungs every 6  (six) hours as needed for wheezing or shortness of breath.  Marland Kitchen amLODipine (NORVASC) 5 MG tablet Take 5 mg by mouth daily.   . Calcium Carbonate-Simethicone (ALKA-SELTZER HEARTBURN + GAS) 750-80 MG CHEW Chew by mouth as needed.  . Cholecalciferol (VITAMIN D PO) Take 1 capsule by mouth 2 (two) times daily.  . ferrous sulfate 325 (65 FE) MG tablet Take 325 mg by mouth daily with breakfast.   . furosemide (LASIX) 20 MG tablet Take 1 tablet (20 mg total) by mouth daily as needed.  . irbesartan (AVAPRO) 300 MG tablet Take 300 mg by mouth daily.   Marland Kitchen ketoconazole (NIZORAL) 2 % shampoo Apply 1 application topically every other day.   . metFORMIN (GLUCOPHAGE) 500 MG tablet Take 500 mg by mouth 2 (two) times daily with a meal.   . metoprolol succinate (TOPROL-XL) 25 MG 24 hr tablet Take 25 mg by mouth daily.  . nitroGLYCERIN (NITROSTAT) 0.4 MG SL tablet Place 1 tablet (0.4 mg total) under the tongue every 5 (five) minutes x 3 doses as needed for chest pain.  Marland Kitchen omeprazole (PRILOSEC) 40 MG capsule Take 1 capsule (40 mg total) by mouth daily.  . OXYGEN Inhale 2 L into the lungs continuous.   . rosuvastatin (CRESTOR) 10 MG tablet Take 5 mg by mouth daily.  . tamsulosin (FLOMAX) 0.4 MG CAPS capsule Take 0.4 mg by mouth daily.          Objective:   Physical Exam  06/28/2015        326 > 08/15/2015  323 >  11/19/2017  326 >  01/05/2018 320 > 02/02/2018    324 >  05/06/2018  312 >  08/06/2018  317     05/17/15 329 lb (149.233 kg)  01/19/15 322 lb (146.058 kg)  01/18/15 319 lb (144.697 kg)    Vital signs reviewed - Note on arrival 02 sats  100% on RA         HEENT: Full dentures/ nl  turbinates bilaterally, and oropharynx. Nl external ear canals without cough reflex   NECK :  without JVD/Nodes/TM/ nl carotid upstrokes bilaterally   LUNGS: no acc muscle use,  Nl contour chest which is clear to A and P bilaterally without cough on insp or exp maneuvers   CV:  RRR  no s3 or murmur or increase in P2, and   1- 2 Plus pitting L > R LE   ABD:  Quite obese/ soft and nontender with nl inspiratory excursion in the supine position. No bruits or organomegaly appreciated, bowel sounds nl  MS:  Nl gait/ ext warm without deformities, calf tenderness, cyanosis or clubbing No obvious joint restrictions   SKIN: warm and dry with nodular plaques over post scalp s hair loss, overlying skin looks sltly scaly and erythematous.   NEURO:  alert, approp, nl sensorium with  no motor or cerebellar deficits apparent.       I personally reviewed images and agree with radiology impression as follows:   Chest CT chest  08/05/18 1. The 1.3 cm left lower lobe lung nodule is unchanged from 01/28/2018. This has increased in size when compared with 01/17/2016 and is new when compared with 01/28/2014. Slow growing, indolent neoplasm cannot be excluded. 2. Aortic Atherosclerosis (ICD10-I70.0) and Emphysema (ICD10-J43.9). 3. Multi vessel coronary artery atherosclerotic calcifications. 4. Small hiatal hernia.     Assessment & Plan:

## 2018-08-06 NOTE — Patient Instructions (Addendum)
Call your dermatologist today for evaluation of your scalp  Please see patient coordinator before you leave today  to schedule PET scan and I'll call the results   Resume PT at [facility]   For cough/ congestion > mucinex dm up 1200 mg every 12 hours as needed    Please schedule a follow up visit in 3 months but call sooner if needed

## 2018-08-07 ENCOUNTER — Encounter: Payer: Self-pay | Admitting: Internal Medicine

## 2018-08-07 NOTE — Assessment & Plan Note (Signed)
pfts 06/28/2015 with restrictive component with erv 41%  Body mass index is 48.26 kg/m.  -  trending up  Lab Results  Component Value Date   TSH 3.74 11/19/2017     Contributing to gerd risk/ doe/reviewed the need and the process to achieve and maintain neg calorie balance > defer f/u primary care including intermittently monitoring thyroid status     I had an extended discussion with the patient/wife  reviewing all relevant studies completed to date and  lasting 15 to 20 minutes of a 25 minute visit    Each maintenance medication was reviewed in detail including most importantly the difference between maintenance and prns and under what circumstances the prns are to be triggered using an action plan format that is not reflected in the computer generated alphabetically organized AVS.     Please see AVS for specific instructions unique to this visit that I personally wrote and verbalized to the the pt in detail and then reviewed with pt  by my nurse highlighting any  changes in therapy recommended at today's visit to their plan of care.

## 2018-08-07 NOTE — Assessment & Plan Note (Signed)
CT chest 01/28/18  New 13 mm nodule at the left lung base  CT Chest 08/05/2018 > The 1.3 cm left lower lobe lung nodule is unchanged from 01/28/2018. This has increased in size when compared with 01/17/2016 and is new when compared with 01/28/2014. Slow growing, indolent neoplasm cannot be excluded.   PET ordered  08/06/2018    Comment:  He is not a candidate for LLobectomy based on lung fuction/ obesity with multiple comorbidities but may consider navigational bx/ then localized RT   Discussed in detail all the  indications, usual  risks and alternatives  relative to the benefits with patient/wife  who agree  to proceed with w/u as outlined.

## 2018-08-07 NOTE — Assessment & Plan Note (Signed)
11/19/2017  sats 81% at rest in a chronic stable state so rec 2lpm 24/7    Adequate control on present rx, reviewed in detail with pt > no change in rx needed  = 2lpm except increase to 3lpm with ex with goal of keeping > 90% reviewed

## 2018-08-07 NOTE — Assessment & Plan Note (Signed)
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 - 05/17/2015  Walked RA  2 laps @ 185 ft each stopped due to  Sob/ desat at nl pace to 83%   - trial off acei 05/17/2015  - PFT's  06/28/2015  FEV1 1.67 (51 % ) ratio 55  p 13 % improvement from saba with DLCO  47 % corrects to 66 % for alv volume  - rec trial of symbicort 06/28/15 > coughing / breathing worse so stopped it   - 08/15/2015  Walked RA x 3 laps @ 185 ft each stopped due to End of study, nl pace, min sob/  desat  To 88%  - sats 81% at rest and not acutely ill 11/19/2017 > placed on 2lpm (see separate a/p)  - Spirometry 11/19/2017  FEV1 1.60 (49%)  Ratio 59 with classic curvature - 11/19/2017  After extensive coaching inhaler device  effectiveness =    90% with smi > trial of stiolto   PFT's  01/05/2018  FEV1 1.66 (52 % ) ratio 60  p 6 % improvement from saba p 6 prior to study with DLCO  38 % corrects to 56  % for alv volume   - 01/05/2018  Referred to rehab [place] - Anoro trial 02/02/2018 > not better so d/c'd   Making very slow progress at rehab and not consistent with going so encouraged him to do the best he can as in the absence of response to LAMA/LABA all we have to offer him is 02 and rehab/ wt loss.

## 2018-08-10 ENCOUNTER — Ambulatory Visit: Payer: Medicare HMO

## 2018-08-17 ENCOUNTER — Ambulatory Visit
Admission: RE | Admit: 2018-08-17 | Discharge: 2018-08-17 | Disposition: A | Discharge status: Home / Self Care | Payer: Medicare HMO | Source: Ambulatory Visit | Attending: Internal Medicine | Admitting: Internal Medicine

## 2018-08-17 DIAGNOSIS — K449 Diaphragmatic hernia without obstruction or gangrene: Secondary | ICD-10-CM | POA: Insufficient documentation

## 2018-08-17 DIAGNOSIS — R911 Solitary pulmonary nodule: Secondary | ICD-10-CM | POA: Diagnosis not present

## 2018-08-17 LAB — GLUCOSE, CAPILLARY: Glucose-Capillary: 120 mg/dL — ABNORMAL HIGH (ref 70–99)

## 2018-08-17 MED ORDER — FLUDEOXYGLUCOSE F - 18 (FDG) INJECTION
16.6000 | Freq: Once | INTRAVENOUS | Status: AC | PRN
  Administered 2018-08-17: 16.6 via INTRAVENOUS

## 2018-08-18 NOTE — Progress Notes (Signed)
Spoke with pt and notified of results per Dr. Wert. Pt verbalized understanding and denied any questions. 

## 2018-08-19 ENCOUNTER — Encounter: Payer: Self-pay | Admitting: Family Medicine

## 2018-08-19 ENCOUNTER — Ambulatory Visit (INDEPENDENT_AMBULATORY_CARE_PROVIDER_SITE_OTHER): Payer: Medicare HMO | Admitting: Family Medicine

## 2018-08-19 VITALS — BP 134/72 | HR 55 | Temp 98.0°F | Ht 68.5 in | Wt 313.5 lb

## 2018-08-19 DIAGNOSIS — I10 Essential (primary) hypertension: Secondary | ICD-10-CM

## 2018-08-19 DIAGNOSIS — D509 Iron deficiency anemia, unspecified: Secondary | ICD-10-CM | POA: Diagnosis not present

## 2018-08-19 DIAGNOSIS — J9611 Chronic respiratory failure with hypoxia: Secondary | ICD-10-CM | POA: Diagnosis not present

## 2018-08-19 DIAGNOSIS — J0110 Acute frontal sinusitis, unspecified: Secondary | ICD-10-CM

## 2018-08-19 LAB — CBC
HCT: 45.2 % (ref 39.0–52.0)
Hemoglobin: 15.3 g/dL (ref 13.0–17.0)
MCHC: 34 g/dL (ref 30.0–36.0)
MCV: 90.7 fl (ref 78.0–100.0)
Platelets: 160 10*3/uL (ref 150.0–400.0)
RBC: 4.98 Mil/uL (ref 4.22–5.81)
RDW: 14.9 % (ref 11.5–15.5)
WBC: 6.5 10*3/uL (ref 4.0–10.5)

## 2018-08-19 LAB — FERRITIN: Ferritin: 99.9 ng/mL (ref 22.0–322.0)

## 2018-08-19 MED ORDER — FLUTICASONE PROPIONATE 50 MCG/ACT NA SUSP: 2.0000 | Freq: Every day | NASAL | 6 refills | Status: DC

## 2018-08-19 MED ORDER — AMOXICILLIN-POT CLAVULANATE 875-125 MG PO TABS: 1.0000 | ORAL_TABLET | Freq: Two times a day (BID) | ORAL | 0 refills | Status: AC

## 2018-08-19 NOTE — Progress Notes (Signed)
Subjective:     Alfred Carney is a 67 y.o. male presenting for Establish Care (previous PCP Dr Lavone Neri (in epic)) and URI (x 3 weeks.)     URI   This is a new problem. The current episode started 1 to 4 weeks ago. The problem has been unchanged. There has been no fever. Associated symptoms include congestion, ear pain, headaches, a plugged ear sensation, sinus pain and wheezing. Pertinent negatives include no chest pain, nausea, rash, sneezing, sore throat or vomiting. He has tried decongestant for the symptoms. The treatment provided no relief.   Knots on his hair - saw his pulmonologist and was referred to dermatology  Symptoms have been constant for 3 weeks. No period of getting better  #COPD - not using daily inhaler - since being on O2, not using the albuterol  - no hospitalizations in the last year  - did have pneumonia in 2008 before he quit smoking - on O2 since May 2019  #Anemia - on iron tablets since august - no bleeding - colonoscopy is up to date   Review of Systems  Constitutional: Negative for chills and fever.  HENT: Positive for congestion, ear pain and sinus pain. Negative for sneezing and sore throat.   Eyes: Negative for pain.  Respiratory: Positive for wheezing. Negative for chest tightness and shortness of breath.   Cardiovascular: Negative for chest pain.  Gastrointestinal: Negative for nausea and vomiting.  Endocrine: Negative for polydipsia and polyuria.  Genitourinary: Negative.   Musculoskeletal: Negative for arthralgias and myalgias.  Skin: Negative for rash.  Allergic/Immunologic: Negative for immunocompromised state.  Neurological: Positive for headaches.  Hematological: Does not bruise/bleed easily.  Psychiatric/Behavioral: The patient is not nervous/anxious.      Social History   Tobacco Use  Smoking Status Former Smoker  . Packs/day: 1.00  . Years: 35.00  . Pack years: 35.00  . Types: Cigarettes  . Last attempt to quit: 09/16/2006    . Years since quitting: 11.9  Smokeless Tobacco Never Used        Objective:    BP Readings from Last 3 Encounters:  08/19/18 134/72  08/06/18 126/74  05/06/18 128/72   Wt Readings from Last 3 Encounters:  08/19/18 (!) 313 lb 8 oz (142.2 kg)  08/17/18 (!) 308 lb (139.7 kg)  08/06/18 (!) 317 lb 6.4 oz (144 kg)    BP 134/72   Pulse (!) 55   Temp 98 F (36.7 C)   Ht 5' 8.5" (1.74 m)   Wt (!) 313 lb 8 oz (142.2 kg)   SpO2 99% Comment: on 2 liters of oxygen  BMI 46.97 kg/m    Physical Exam  Constitutional: He appears well-developed and well-nourished. He does not appear ill. No distress.  HENT:  Head: Normocephalic and atraumatic.  Right Ear: Tympanic membrane and ear canal normal.  Left Ear: Ear canal normal.  Nose: Mucosal edema and rhinorrhea present. Right sinus exhibits no maxillary sinus tenderness and no frontal sinus tenderness. Left sinus exhibits no maxillary sinus tenderness and no frontal sinus tenderness.  Mouth/Throat: Uvula is midline and mucous membranes are normal. Posterior oropharyngeal erythema present. No oropharyngeal exudate or posterior oropharyngeal edema. Tonsils are 0 on the right. Tonsils are 0 on the left.  Left TM with impacted cerumen  Eyes: EOM are normal. No scleral icterus.  Neck: Neck supple.  Cardiovascular: Normal rate and regular rhythm.  No murmur heard. Pulmonary/Chest: Effort normal and breath sounds normal. No respiratory distress. He has no  wheezes. He has no rales.  Nasal Canula in place. Breathing comfortably  Lymphadenopathy:    He has no cervical adenopathy.  Neurological: He is alert.  Skin: Skin is warm and dry. Capillary refill takes less than 2 seconds.  Psychiatric: He has a normal mood and affect.          Assessment & Plan:   Problem List Items Addressed This Visit      Cardiovascular and Mediastinum   Essential hypertension    BP at goal on metoprolol, irbesartan, and amlodipine        Respiratory    Chronic respiratory failure with hypoxia ([place])    Doing well on Oxygen        Other   Microcytic anemia    S/p colonoscopy with several polyps removed and plan for repeat in 1 year. On iron x 4 months but not regularly taking metformin due to side effects. Will repeat labs to assess      Relevant Orders   CBC   Ferritin    Other Visit Diagnoses    Acute non-recurrent frontal sinusitis    -  Primary   Relevant Medications   amoxicillin-clavulanate (AUGMENTIN) 875-125 MG tablet   fluticasone (FLONASE) 50 MCG/ACT nasal spray     Given duration of symptoms and sinus pressure will treat for acute sinusitis.  Return in 1 week is symptoms not resolved    Return in about 3 months (around 11/18/2018).  Lesleigh Noe, MD

## 2018-08-19 NOTE — Assessment & Plan Note (Signed)
S/p colonoscopy with several polyps removed and plan for repeat in 1 year. On iron x 4 months but not regularly taking metformin due to side effects. Will repeat labs to assess

## 2018-08-19 NOTE — Patient Instructions (Addendum)
Go to the lab today to get blood    Return if symptoms have not greatly improved or resolved in 1 week.    1. Drink plenty of fluids 2. Get lots of rest  Sinus Congestion 1) Neti Pot (Saline rinse) -- 2 times day -- if tolerated 2) Flonase (Store Brand ok) - once daily 3) Over the counter congestion medications  If you develop fevers (Temperature >100.4), chills, worsening symptoms or symptoms lasting longer than 10 days return to clinic.

## 2018-08-19 NOTE — Assessment & Plan Note (Signed)
Doing well on Oxygen

## 2018-08-19 NOTE — Assessment & Plan Note (Signed)
BP at goal on metoprolol, irbesartan, and amlodipine

## 2018-08-22 DIAGNOSIS — R0602 Shortness of breath: Secondary | ICD-10-CM | POA: Diagnosis not present

## 2018-08-22 DIAGNOSIS — J449 Chronic obstructive pulmonary disease, unspecified: Secondary | ICD-10-CM | POA: Diagnosis not present

## 2018-09-22 DIAGNOSIS — J449 Chronic obstructive pulmonary disease, unspecified: Secondary | ICD-10-CM | POA: Diagnosis not present

## 2018-09-22 DIAGNOSIS — R0602 Shortness of breath: Secondary | ICD-10-CM | POA: Diagnosis not present

## 2018-10-21 ENCOUNTER — Emergency Department (HOSPITAL_COMMUNITY): Payer: Medicare HMO

## 2018-10-21 ENCOUNTER — Inpatient Hospital Stay (HOSPITAL_COMMUNITY)
Admission: EM | Admit: 2018-10-21 | Discharge: 2018-10-31 | DRG: 519 | Disposition: A | Discharge status: Home Health | Payer: Medicare HMO | Attending: Internal Medicine | Admitting: Internal Medicine

## 2018-10-21 ENCOUNTER — Observation Stay (HOSPITAL_COMMUNITY): Payer: Medicare HMO

## 2018-10-21 ENCOUNTER — Encounter (HOSPITAL_COMMUNITY): Payer: Self-pay | Admitting: Emergency Medicine

## 2018-10-21 DIAGNOSIS — R52 Pain, unspecified: Secondary | ICD-10-CM | POA: Diagnosis not present

## 2018-10-21 DIAGNOSIS — Z7951 Long term (current) use of inhaled steroids: Secondary | ICD-10-CM

## 2018-10-21 DIAGNOSIS — Z87891 Personal history of nicotine dependence: Secondary | ICD-10-CM

## 2018-10-21 DIAGNOSIS — K219 Gastro-esophageal reflux disease without esophagitis: Secondary | ICD-10-CM | POA: Diagnosis present

## 2018-10-21 DIAGNOSIS — R600 Localized edema: Secondary | ICD-10-CM | POA: Diagnosis present

## 2018-10-21 DIAGNOSIS — Z96642 Presence of left artificial hip joint: Secondary | ICD-10-CM | POA: Diagnosis not present

## 2018-10-21 DIAGNOSIS — M25552 Pain in left hip: Secondary | ICD-10-CM | POA: Diagnosis not present

## 2018-10-21 DIAGNOSIS — Z9981 Dependence on supplemental oxygen: Secondary | ICD-10-CM

## 2018-10-21 DIAGNOSIS — J439 Emphysema, unspecified: Secondary | ICD-10-CM | POA: Diagnosis present

## 2018-10-21 DIAGNOSIS — Z79899 Other long term (current) drug therapy: Secondary | ICD-10-CM

## 2018-10-21 DIAGNOSIS — K5900 Constipation, unspecified: Secondary | ICD-10-CM | POA: Diagnosis not present

## 2018-10-21 DIAGNOSIS — E875 Hyperkalemia: Secondary | ICD-10-CM | POA: Diagnosis present

## 2018-10-21 DIAGNOSIS — I10 Essential (primary) hypertension: Secondary | ICD-10-CM | POA: Diagnosis present

## 2018-10-21 DIAGNOSIS — E662 Morbid (severe) obesity with alveolar hypoventilation: Secondary | ICD-10-CM | POA: Diagnosis present

## 2018-10-21 DIAGNOSIS — M4726 Other spondylosis with radiculopathy, lumbar region: Secondary | ICD-10-CM | POA: Diagnosis present

## 2018-10-21 DIAGNOSIS — E1165 Type 2 diabetes mellitus with hyperglycemia: Secondary | ICD-10-CM | POA: Diagnosis present

## 2018-10-21 DIAGNOSIS — G8929 Other chronic pain: Secondary | ICD-10-CM | POA: Diagnosis present

## 2018-10-21 DIAGNOSIS — I2510 Atherosclerotic heart disease of native coronary artery without angina pectoris: Secondary | ICD-10-CM

## 2018-10-21 DIAGNOSIS — Z8349 Family history of other endocrine, nutritional and metabolic diseases: Secondary | ICD-10-CM

## 2018-10-21 DIAGNOSIS — Z85828 Personal history of other malignant neoplasm of skin: Secondary | ICD-10-CM

## 2018-10-21 DIAGNOSIS — M5116 Intervertebral disc disorders with radiculopathy, lumbar region: Secondary | ICD-10-CM | POA: Diagnosis not present

## 2018-10-21 DIAGNOSIS — Z471 Aftercare following joint replacement surgery: Secondary | ICD-10-CM | POA: Diagnosis not present

## 2018-10-21 DIAGNOSIS — Z6841 Body Mass Index (BMI) 40.0 and over, adult: Secondary | ICD-10-CM

## 2018-10-21 DIAGNOSIS — K760 Fatty (change of) liver, not elsewhere classified: Secondary | ICD-10-CM | POA: Diagnosis present

## 2018-10-21 DIAGNOSIS — Z7984 Long term (current) use of oral hypoglycemic drugs: Secondary | ICD-10-CM

## 2018-10-21 DIAGNOSIS — R911 Solitary pulmonary nodule: Secondary | ICD-10-CM | POA: Diagnosis present

## 2018-10-21 DIAGNOSIS — Z8679 Personal history of other diseases of the circulatory system: Secondary | ICD-10-CM

## 2018-10-21 DIAGNOSIS — M545 Low back pain: Secondary | ICD-10-CM | POA: Diagnosis not present

## 2018-10-21 DIAGNOSIS — M48061 Spinal stenosis, lumbar region without neurogenic claudication: Secondary | ICD-10-CM | POA: Diagnosis not present

## 2018-10-21 DIAGNOSIS — Z8249 Family history of ischemic heart disease and other diseases of the circulatory system: Secondary | ICD-10-CM

## 2018-10-21 DIAGNOSIS — M109 Gout, unspecified: Secondary | ICD-10-CM | POA: Diagnosis present

## 2018-10-21 DIAGNOSIS — Z825 Family history of asthma and other chronic lower respiratory diseases: Secondary | ICD-10-CM

## 2018-10-21 DIAGNOSIS — J9611 Chronic respiratory failure with hypoxia: Secondary | ICD-10-CM | POA: Diagnosis present

## 2018-10-21 DIAGNOSIS — Z9119 Patient's noncompliance with other medical treatment and regimen: Secondary | ICD-10-CM

## 2018-10-21 DIAGNOSIS — M549 Dorsalgia, unspecified: Secondary | ICD-10-CM | POA: Diagnosis present

## 2018-10-21 DIAGNOSIS — E1151 Type 2 diabetes mellitus with diabetic peripheral angiopathy without gangrene: Secondary | ICD-10-CM | POA: Diagnosis present

## 2018-10-21 DIAGNOSIS — Z86010 Personal history of colonic polyps: Secondary | ICD-10-CM

## 2018-10-21 DIAGNOSIS — D500 Iron deficiency anemia secondary to blood loss (chronic): Secondary | ICD-10-CM | POA: Diagnosis present

## 2018-10-21 DIAGNOSIS — Z419 Encounter for procedure for purposes other than remedying health state, unspecified: Secondary | ICD-10-CM

## 2018-10-21 DIAGNOSIS — M5126 Other intervertebral disc displacement, lumbar region: Secondary | ICD-10-CM

## 2018-10-21 DIAGNOSIS — H9190 Unspecified hearing loss, unspecified ear: Secondary | ICD-10-CM | POA: Diagnosis present

## 2018-10-21 DIAGNOSIS — E785 Hyperlipidemia, unspecified: Secondary | ICD-10-CM | POA: Diagnosis present

## 2018-10-21 DIAGNOSIS — J449 Chronic obstructive pulmonary disease, unspecified: Secondary | ICD-10-CM | POA: Diagnosis present

## 2018-10-21 LAB — BASIC METABOLIC PANEL
Anion gap: 14 (ref 5–15)
BUN: 14 mg/dL (ref 8–23)
CO2: 25 mmol/L (ref 22–32)
Calcium: 9.4 mg/dL (ref 8.9–10.3)
Chloride: 103 mmol/L (ref 98–111)
Creatinine, Ser: 1.2 mg/dL (ref 0.61–1.24)
GFR calc Af Amer: >60 mL/min (ref >60)
GFR calc non Af Amer: >60 mL/min (ref >60)
Glucose, Bld: 149 mg/dL — ABNORMAL HIGH (ref 70–99)
Potassium: 3.5 mmol/L (ref 3.5–5.1)
Sodium: 142 mmol/L (ref 135–145)

## 2018-10-21 LAB — CBC WITH DIFFERENTIAL/PLATELET
Abs Immature Granulocytes: 0.02 10*3/uL (ref 0.00–0.07)
Basophils Absolute: 0.1 10*3/uL (ref 0.0–0.1)
Basophils Relative: 1 %
Eosinophils Absolute: 0 10*3/uL (ref 0.0–0.5)
Eosinophils Relative: 0 %
HCT: 50.5 % (ref 39.0–52.0)
Hemoglobin: 16.4 g/dL (ref 13.0–17.0)
Immature Granulocytes: 0 %
Lymphocytes Relative: 20 %
Lymphs Abs: 2.1 10*3/uL (ref 0.7–4.0)
MCH: 29.3 pg (ref 26.0–34.0)
MCHC: 32.5 g/dL (ref 30.0–36.0)
MCV: 90.2 fL (ref 80.0–100.0)
Monocytes Absolute: 0.9 10*3/uL (ref 0.1–1.0)
Monocytes Relative: 9 %
Neutro Abs: 7.6 10*3/uL (ref 1.7–7.7)
Neutrophils Relative %: 70 %
Platelets: 189 10*3/uL (ref 150–400)
RBC: 5.6 MIL/uL (ref 4.22–5.81)
RDW: 13.8 % (ref 11.5–15.5)
WBC: 10.7 10*3/uL — ABNORMAL HIGH (ref 4.0–10.5)
nRBC: 0 % (ref 0.0–0.2)

## 2018-10-21 MED ORDER — GADOBUTROL 1 MMOL/ML IV SOLN
10.0000 mL | Freq: Once | INTRAVENOUS | Status: AC | PRN
  Administered 2018-10-21: 10 mL via INTRAVENOUS

## 2018-10-21 MED ORDER — SODIUM CHLORIDE 0.9 % IV SOLN: 250.0000 mL | INTRAVENOUS | Status: DC | PRN

## 2018-10-21 MED ORDER — ONDANSETRON HCL 4 MG PO TABS: 4.0000 mg | ORAL_TABLET | Freq: Four times a day (QID) | ORAL | Status: DC | PRN

## 2018-10-21 MED ORDER — HYDROMORPHONE HCL 1 MG/ML IJ SOLN
0.5000 mg | Freq: Once | INTRAMUSCULAR | Status: AC
  Administered 2018-10-21: 0.5 mg via INTRAVENOUS
  Filled 2018-10-21: qty 1

## 2018-10-21 MED ORDER — OXYCODONE HCL 5 MG PO TABS
10.0000 mg | ORAL_TABLET | ORAL | Status: DC | PRN
  Administered 2018-10-21 – 2018-10-28 (×16): 10 mg via ORAL
  Filled 2018-10-21 (×19): qty 2

## 2018-10-21 MED ORDER — ACETAMINOPHEN 325 MG PO TABS
650.0000 mg | ORAL_TABLET | Freq: Four times a day (QID) | ORAL | Status: DC
  Administered 2018-10-21 – 2018-10-31 (×32): 650 mg via ORAL
  Filled 2018-10-21 (×32): qty 2

## 2018-10-21 MED ORDER — ONDANSETRON HCL 4 MG/2ML IJ SOLN
4.0000 mg | Freq: Four times a day (QID) | INTRAMUSCULAR | Status: DC | PRN
  Administered 2018-10-28: 4 mg via INTRAVENOUS

## 2018-10-21 MED ORDER — POLYETHYLENE GLYCOL 3350 17 G PO PACK: 17.0000 g | PACK | Freq: Every day | ORAL | Status: DC | PRN

## 2018-10-21 MED ORDER — KETOROLAC TROMETHAMINE 30 MG/ML IJ SOLN
30.0000 mg | Freq: Four times a day (QID) | INTRAMUSCULAR | Status: DC | PRN
  Administered 2018-10-21 – 2018-10-25 (×5): 30 mg via INTRAVENOUS
  Filled 2018-10-21 (×5): qty 1

## 2018-10-21 MED ORDER — HEPARIN SODIUM (PORCINE) 5000 UNIT/ML IJ SOLN
5000.0000 [IU] | Freq: Three times a day (TID) | INTRAMUSCULAR | Status: DC
  Administered 2018-10-21 – 2018-10-22 (×2): 5000 [IU] via SUBCUTANEOUS
  Filled 2018-10-21 (×2): qty 1

## 2018-10-21 MED ORDER — SODIUM CHLORIDE 0.9% FLUSH: 3.0000 mL | INTRAVENOUS | Status: DC | PRN

## 2018-10-21 MED ORDER — OXYCODONE HCL 5 MG PO TABS
5.0000 mg | ORAL_TABLET | ORAL | Status: DC | PRN
  Administered 2018-10-21 – 2018-10-28 (×3): 5 mg via ORAL
  Filled 2018-10-21 (×3): qty 1

## 2018-10-21 MED ORDER — SODIUM CHLORIDE 0.9% FLUSH
3.0000 mL | Freq: Two times a day (BID) | INTRAVENOUS | Status: DC
  Administered 2018-10-21 – 2018-10-30 (×18): 3 mL via INTRAVENOUS

## 2018-10-21 MED ORDER — TRAZODONE HCL 50 MG PO TABS
50.0000 mg | ORAL_TABLET | Freq: Every evening | ORAL | Status: DC | PRN
  Administered 2018-10-21 – 2018-10-24 (×2): 50 mg via ORAL
  Filled 2018-10-21 (×4): qty 1

## 2018-10-21 NOTE — H&P (Signed)
History and Physical    [name] MIW:[id number] DOB: 1951/07/08 DOA: 10/21/2018  PCP: Lesleigh Noe, MD  Patient coming from: Home  I have personally briefly reviewed patient's old medical records in [place]  Chief Complaint: Left hip and back pain  HPI: Alfred Carney is a 68 y.o. male with medical history significant of left total hip replacement by Dr. Sharol Given in 2013, COPD GOLD II, chronic respiratory failure sats 81% at rest and chronic stable state.  Patient uses 2 L of oxygen 24/7,, active sleep apnea but did not tolerate CPAP, falls asleep after eating, sleeps in a recliner for 10 years, with muscle multivessel coronary artery atherosclerosis calcifications noted on CT scan, small hiatal hernia, who presents today with complaints of left-sided hip and back pain.  His hip is going to give out and it causes him extreme pain.  About a week ago he was at his father's funeral and he had to walk uphill to the site.  Or getting home he developed pain in his left hip and lower back that has progressively worsened.  He has been unable to ambulate without excruciating pain.  He can only take 1 or 2 steps max.  Has no distal neurological deficits, is been taking Tylenol and leftover oxycodone without any improvement in his symptoms.  Has been using his baseline oxygen of 2 L with his sats generally around 95%.  ED Course: X-rays unremarkable but unable to ambulate.  Discussed with orthopedics who recommended MRI of the back.  I have requested CT scan of the left hip as well.  Patient will be placed in overnight observation.  Review of Systems: As per HPI otherwise all other systems reviewed and  negative.    Past Medical History:  Diagnosis Date  . Chronic obstructive pulmonary disease ([place]) 2008   Moderate  . Colon polyps   . Community acquired pneumonia   . Coronary artery disease   . Degenerative joint disease   . Diabetes mellitus without complication ([place])    type 2  .  Emphysema lung ([place])   . Enlarged liver    fatty liver by '09 CT  . Gastroesophageal reflux disease   . Gout   . H/O hiatal hernia   . History of [place] spotted fever    Possible history of [place] Spotted Fever  . Hyperlipidemia   . Hypertension   . Hypokalemia    diuretic induced, resolved  . Microcytic anemia    iron pills  . Morbid obesity ([place])   . Shortness of breath   . Skin cancer    skin cancer lip removed  . Sleep apnea    not currently using CPAP 05/20/13  . 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 CARDIAC CATHETERIZATION  2007   Ejection fraction is estimated at 60%  . CHOLECYSTECTOMY    . COLONOSCOPY WITH PROPOFOL N/A 01/22/2018   Procedure: COLONOSCOPY WITH PROPOFOL;  Surgeon: Mauri Pole, MD;  Location: [facility];  Service: Endoscopy;  Laterality: N/A;  . ESOPHAGOGASTRODUODENOSCOPY (EGD) WITH PROPOFOL N/A 01/22/2018   Procedure: ESOPHAGOGASTRODUODENOSCOPY (EGD) WITH PROPOFOL;  Surgeon: Mauri Pole, MD;  Location: [facility];  Service: Endoscopy;  Laterality: N/A;  . HARDWARE REMOVAL Left 05/26/2013   Procedure: HARDWARE REMOVAL;  Surgeon: Newt Minion, MD;  Location: [facility];  Service: Orthopedics;  Laterality: Left;  Left Total Hip Arthroplasty, Removal  of Deep Hardware  . HIP SURGERY     Status post left hip surgery with bone grafting  . LEFT HEART CATHETERIZATION WITH CORONARY ANGIOGRAM N/A 01/31/2014   Procedure: LEFT HEART CATHETERIZATION WITH CORONARY ANGIOGRAM;  Surgeon: Burnell Blanks, MD;  Location: [facility] CATH LAB;  Service: Cardiovascular;  Laterality: N/A;  . THORACOABDOMINAL AORTIC ANEURYSM REPAIR     with right femoral and left iliac BPG and reimplantation of renal arteries.  . TONSILLECTOMY    . TONSILLECTOMY    . TOTAL HIP ARTHROPLASTY Left 05/26/2013   Procedure: TOTAL HIP ARTHROPLASTY;  Surgeon: Newt Minion, MD;  Location:  [place];  Service: Orthopedics;  Laterality: Left;  Left Total Hip Arthroplasty, Removal Deep Hardware    Social History   Social History Narrative   Retired from [employer] work    Lives on [address]   Married to Alfred Carney, Alfred Carney, Alfred Carney   Has 10 grandchildren, even great grandchildren   Family is scattered - some in [place], [place], [place], [place]   Enjoys - playing guitar   Exercise - participates in silver sneakers 3 times week - walking on the treadmill and pedel activity   Diet - losing 15 lbs, following diabetic diet     reports that he quit smoking about 12 years ago. His smoking use included cigarettes. He has a 35.00 pack-year smoking history. He has never used smokeless tobacco. He reports that he does not drink alcohol or use drugs.  No Known Allergies  Family History  Problem Relation Age of Onset  . Osteoarthritis Mother   . Diabetes Mother   . Pulmonary embolism Mother   . Heart disease Father        Coronary Artery Disease  . Stroke Father   . Multiple sclerosis Sister   . Factor V Leiden deficiency Sister   . Emphysema Sister   . Hyperlipidemia Brother   . Lung cancer Maternal Grandfather        smoked  . Heart attack Maternal Grandmother      Prior to Admission medications   Medication Sig Start Date End Date Taking? Authorizing Provider  albuterol (PROVENTIL HFA;VENTOLIN HFA) 108 (90 Base) MCG/ACT inhaler Inhale 2 puffs into the lungs every 6 (six) hours as needed for wheezing or shortness of breath. 01/06/18   Tanda Rockers, MD  amLODipine (NORVASC) 5 MG tablet Take 5 mg by mouth daily.  12/31/17   [provider]  Calcium Carbonate-Simethicone (ALKA-SELTZER HEARTBURN + GAS) 750-80 MG CHEW Chew by mouth as needed.    [provider]  Cholecalciferol (VITAMIN D PO) Take 1 capsule by mouth 2 (two) times daily.    [provider]  ferrous sulfate 325 (65 FE) MG tablet Take 325 mg by mouth daily with  breakfast.  12/18/17   [provider]  fluticasone (FLONASE) 50 MCG/ACT nasal spray Place 2 sprays into both nostrils daily. 08/19/18   Lesleigh Noe, MD  furosemide (LASIX) 20 MG tablet Take 1 tablet (20 mg total) by mouth daily as needed. 06/28/15   Tanda Rockers, MD  irbesartan (AVAPRO) 300 MG tablet Take 300 mg by mouth daily.  12/28/17   [provider]  ketoconazole (NIZORAL) 2 % shampoo Apply 1 application topically every other day.  05/22/17   [provider]  metFORMIN (GLUCOPHAGE) 500 MG tablet Take 500 mg by mouth 2 (two) times daily with a meal.  11/13/15   [provider]  metoprolol succinate (TOPROL-XL) 25 MG 24 hr tablet Take 25 mg by mouth daily.    [provider]  nitroGLYCERIN (NITROSTAT) 0.4 MG SL tablet Place 1 tablet (0.4 mg total) under the tongue every 5 (five) minutes x 3 doses as needed for chest pain. 01/31/14   Isaiah Serge, NP  omeprazole (PRILOSEC) 40 MG capsule Take 1 capsule (40 mg total) by mouth daily. 01/22/18   Mauri Pole, MD  OXYGEN Inhale 2 L into the lungs continuous.     [provider]  rosuvastatin (CRESTOR) 10 MG tablet Take 5 mg by mouth daily.    [provider]  tamsulosin (FLOMAX) 0.4 MG CAPS capsule Take 0.4 mg by mouth daily.  10/20/17   [provider]    Physical Exam:  Constitutional: NAD, calm, uncomfortable lying absolutely flat on the stretcher Vitals:   10/21/18 0847 10/21/18 0939 10/21/18 1441  BP: 100/68 (!) 138/115 (!) 154/69  Pulse: 80 71 61  Resp: 16 18 18   Temp: 98.1 F (36.7 C)  98 F (36.7 C)  TempSrc: Oral  Oral  SpO2: 99% 97% 98%   Eyes: PERRL, lids and conjunctivae normal ENMT: Mucous membranes are moist. Posterior pharynx clear of any exudate or lesions.Normal dentition.  Neck: normal, supple, no masses, no thyromegaly Respiratory: clear to auscultation bilaterally, no wheezing, no crackles. Normal respiratory effort. No accessory muscle use.   Cardiovascular: Regular rate and rhythm, no murmurs / rubs / gallops. No extremity edema. 2+ pedal pulses. No carotid bruits.  Abdomen: no tenderness, no masses palpated. No hepatosplenomegaly. Bowel sounds positive.  Musculoskeletal: no clubbing / cyanosis. No joint deformity upper and lower extremities.  Unable to assess range of motion due to severe pain, no contractures. Normal muscle tone.  Skin: no rashes, lesions, ulcers. No induration Neurologic: CN 2-12 grossly intact. Sensation intact, DTR normal. Strength 5/5 in all 4.  Psychiatric: Normal judgment and insight. Alert and oriented x 3. Normal mood.     Labs on Admission: I have personally reviewed following labs and imaging studies  CBC: Recent Labs  Lab 10/21/18 1154  WBC 10.7*  NEUTROABS 7.6  HGB 16.4  HCT 50.5  MCV 90.2  PLT 924   Basic Metabolic Panel: Recent Labs  Lab 10/21/18 1154  NA 142  K 3.5  CL 103  CO2 25  GLUCOSE 149*  BUN 14  CREATININE 1.20  CALCIUM 9.4   BNP (last 3 results) Recent Labs    11/19/17 1556  PROBNP 79.0   Urine analysis:    Component Value Date/Time   COLORURINE AMBER (A) 05/05/2014 1706   APPEARANCEUR CLEAR 05/05/2014 1706   LABSPEC 1.024 05/05/2014 1706   PHURINE 5.5 05/05/2014 1706   GLUCOSEU NEGATIVE 05/05/2014 1706   HGBUR NEGATIVE 05/05/2014 1706   BILIRUBINUR SMALL (A) 05/05/2014 1706   KETONESUR 15 (A) 05/05/2014 1706   PROTEINUR 30 (A) 05/05/2014 1706   UROBILINOGEN 1.0 05/05/2014 1706   NITRITE NEGATIVE 05/05/2014 1706   LEUKOCYTESUR NEGATIVE 05/05/2014 1706    Radiological Exams on Admission: Dg Lumbar Spine Complete  Result Date: 10/21/2018 CLINICAL DATA:  Lumbago EXAM: LUMBAR SPINE - COMPLETE 4+ VIEW COMPARISON:  None. FINDINGS: Frontal, lateral, spot lumbosacral lateral, and bilateral oblique views were obtained. There are 5 non-rib-bearing lumbar type vertebral bodies. There is no fracture or spondylolisthesis. There is moderate disc space narrowing at  L4-5 and L5-S1. There are anterior osteophytes at all levels. Posterior osteophytes are noted at L4 and L5. No erosive  changes are evident. There is facet osteoarthritic change all levels bilaterally, most notably at L5-S1 bilaterally. 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 replacement on the left. 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 No fracture or spondylolisthesis. There is aortoiliac atherosclerosis. Electronically Signed   By: Lowella Grip III M.D.   On: 10/21/2018 10:41   Ct Hip Left Wo Contrast  Result Date: 10/21/2018 CLINICAL DATA:  Left hip pain for 1 week which began while walking up a steep hill. EXAM: CT OF THE LEFT HIP WITHOUT CONTRAST TECHNIQUE: Multidetector CT imaging of the left hip was performed according to the standard protocol. Multiplanar CT image reconstructions were also generated. COMPARISON:  Plain films left hip 10/21/2018 and CT abdomen and pelvis 01/28/2018 in 05/05/2014. FINDINGS: Bones/Joint/Cartilage Left hip replacement is identified with a single wire across the posterior aspect of the intertrochanteric femur, unchanged. The hip is located. No hardware complication is identified. There is no acute fracture. Ligaments Suboptimally assessed by CT. Muscles and Tendons Intact and normal in appearance. Soft tissues Intrapelvic contents demonstrate atherosclerosis. IMPRESSION: No acute abnormality or finding to explain the patient's pain. Left hip arthroplasty in place without complicating feature. Atherosclerosis. Electronically Signed   By: Inge Rise M.D.   On: 10/21/2018 14:34   Dg Hip Unilat W Or Wo Pelvis 2-3 Views Left  Result Date: 10/21/2018 CLINICAL DATA:  Pain EXAM: DG HIP (WITH OR WITHOUT PELVIS) 2-3V LEFT COMPARISON:  CT abdomen and pelvis with bony reformats Jan 28, 2018 FINDINGS: Frontal pelvis as well as frontal and lateral left hip images were obtained. Patient is status post total hip replacement on the  left with prosthetic components well-seated. No acute fracture or dislocation evident. There is moderate osteoarthritic change in the right hip joint, stable. No erosive changes are appreciable. Bony overgrowth along the right acetabulum is noted. There is calcification in the common and superficial femoral arteries bilaterally. IMPRESSION: No acute fracture or dislocation. Status post total hip replacement on the left with prosthetic components on the left well seated. Moderate arthropathy in the right hip joint, stable, with bony overgrowth along the right acetabulum. 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 No erosive change. Multifocal arterial vascular calcification noted. Electronically Signed   By: Lowella Grip III M.D.   On: 10/21/2018 10:40    Assessment/Plan Principal Problem:   Left hip pain Active Problems:   COPD GOLD II   Chronic respiratory failure with hypoxia (HCC)   Morbid obesity due to excess calories (HCC)   Hyperlipidemia   Essential hypertension   Iron deficiency anemia due to chronic blood loss   Solitary pulmonary nodule on lung CT   1.  Left hip pain: Pain occurred after strenuous exercise walking uphill.  Differential diagnosis includes refracture of hip versus vertebral fracture or stenosis.  Patient to be placed in overnight observation and started on pain control.  Pain management regimen to include scheduled Tylenol, PRN oxycodone and increasing doses, and IV Toradol for severe pain.  Dr. Sharol Given has been consulted by me after discussion with orthopedics PA  2.  COPD Gold 2: Continue outpatient management per Dr. Melvyn Novas.  Continue home medications.  3.  Chronic respiratory failure with hypoxia: Patient on 2 L nasal cannula continuously will continue this while in the hospital.  4.  Morbid obesity due to excess calories: Discussed with the patient the importance of an anti-inflammatory diet as well as calorie control including  avoiding carbohydrates  and consideration of seeing a nutritionist.  5.  Hyperlipidemia: Continue outpatient medication management.  6.  Essential hypertension restart home medicines once medications have been reconciled by pharmacy.  7.  Iron deficiency anemia due to chronic blood loss: Patient followed by [facility].  To new outpatient treatment plan.  8.  Solitary pulmonary nodule on lung CT: Patient followed by Dr. Melvyn Novas.  Continue present plan.  DVT prophylaxis: Subcu heparin Code Status: Full code Family Communication: Spoke with patient's wife who was present at the bedside at the time of admission.  Patient retains capacity. Disposition Plan: To be determined Consults called: Orthopedics, Dr. Sharol Given is out of town I have consulted his partner.  I spoke to Junie Panning his nurse practitioner in the office who will communicate consultation. Admission status: Observation   Lady Deutscher MD FACP [facility] Pager [phone]  How to contact the [facility] Attending or Consulting provider [place] or covering provider during after hours [place], for this patient?  1. Check the care team in [facility] and look for a) attending/consulting [facility] provider listed and b) the [facility] team listed 2. Log into [email or web address] and use [place]'s universal password to access. If you do not have the password, please contact the hospital operator. 3. Locate the [facility] provider you are looking for under [facility] and page to a number that you can be directly reached. 4. If you still have difficulty reaching the provider, please page the [facility] (Director on Call) for the Hospitalists listed on amion for assistance.  If 7PM-7AM, please contact night-coverage [email or web address] Password [facility]  10/21/2018, 3:33 PM

## 2018-10-21 NOTE — ED Provider Notes (Signed)
[facility] EMERGENCY DEPARTMENT Provider Note   CSN: [id number] Arrival date & time: 10/21/18  0830     History   Chief Complaint Chief Complaint  Patient presents with  . Hip Pain    HPI Alfred Carney is a 68 y.o. male.   HPI   68 year old male presents today with complaints of left-sided hip and back pain.  Patient notes a significant past medical history of left total hip replacement performed by Dr. Sharol Given in 2013.  He notes since that time he has had very little pain until recently.  He notes he has a history of COPD and is undergoing pulmonary rehab.  He notes that while walking on the treadmill he feels as if his hip is going to give out and causes pain.   Patient notes that approximately 1 week ago he was at his father's funeral.  He notes he was having to walk over uneven terrain.  He reports after getting home he developed pain in his left hip and lower back that has progressively worsened.  Patient notes he has been unable to ambulate without excruciating pain.  He notes that 1 or 2 steps is the max of his ability at this point.  He does not feel he is able to walk secondary to pain at this time.  Patient denies any distal neurological deficits.  Patient notes taking Tylenol and leftover oxycodone without significant improvement in his symptoms.  Patient does note a history of COPD he is on baseline 2 L of oxygen and is normally around 95%.  Patient denies any acute pulmonary concerns at this time.    Past Medical History:  Diagnosis Date  . Chronic obstructive pulmonary disease ([place]) 2008   Moderate  . Colon polyps   . Community acquired pneumonia   . Coronary artery disease   . Degenerative joint disease   . Diabetes mellitus without complication ([place])    type 2  . Emphysema lung ([place])   . Enlarged liver    fatty liver by '09 CT  . Gastroesophageal reflux disease   . Gout   . H/O hiatal hernia   . History of [place] spotted fever    Possible history of [place] Spotted Fever  . Hyperlipidemia   . Hypertension   . Hypokalemia    diuretic induced, resolved  . Microcytic anemia    iron pills  . Morbid obesity ([place])   . Shortness of breath   . Skin cancer    skin cancer lip removed  . Sleep apnea    not currently using CPAP 05/20/13  . 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 Left hip pain 10/21/2018  . Solitary pulmonary nodule on lung CT 01/29/2018  . Iron deficiency anemia due to chronic blood loss   . Polyp of cecum   . Microcytic anemia 11/20/2017  . Dyspnea on exertion 11/19/2017  . Chronic respiratory failure with hypoxia ([place]) 11/19/2017  . COPD GOLD II 05/17/2015  . Morbid obesity due to excess calories ([place]) 05/17/2015  . 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 Sepsis ([place]) 05/05/2014  . Chest pain, negative MI, stable CT angio of chest, stable non obstructive CAD -possible GI cause 01/28/2014  . CAD (coronary artery disease) stable non obstructive CAD 09/25/2011  . PAD (peripheral artery disease) ([place]) 09/25/2011  . Hyperlipidemia  09/25/2011  . Essential hypertension 09/25/2011  . ABDOMINAL AORTIC ANEURYSM, HX OF 06/30/2007    Past Surgical History:  Procedure Laterality Date  . CARDIAC CATHETERIZATION  2007   Ejection fraction is estimated at 60%  . CHOLECYSTECTOMY    . COLONOSCOPY WITH PROPOFOL N/A 01/22/2018   Procedure: COLONOSCOPY WITH PROPOFOL;  Surgeon: Mauri Pole, MD;  Location: [facility];  Service: Endoscopy;  Laterality: N/A;  . ESOPHAGOGASTRODUODENOSCOPY (EGD) WITH PROPOFOL N/A 01/22/2018   Procedure: ESOPHAGOGASTRODUODENOSCOPY (EGD) WITH PROPOFOL;  Surgeon: Mauri Pole, MD;  Location: [facility];  Service: Endoscopy;  Laterality: N/A;  . HARDWARE REMOVAL Left 05/26/2013   Procedure: HARDWARE REMOVAL;  Surgeon: Newt Minion, MD;  Location: [facility];  Service: Orthopedics;  Laterality: Left;  Left Total Hip Arthroplasty, Removal of Deep Hardware  . HIP SURGERY     Status post left hip surgery with bone grafting  . LEFT HEART CATHETERIZATION WITH CORONARY ANGIOGRAM N/A 01/31/2014   Procedure: LEFT HEART CATHETERIZATION WITH CORONARY ANGIOGRAM;  Surgeon: Burnell Blanks, MD;  Location: [facility] CATH LAB;  Service: Cardiovascular;  Laterality: N/A;  . THORACOABDOMINAL AORTIC ANEURYSM REPAIR     with right femoral and left iliac BPG and reimplantation of renal arteries.  . TONSILLECTOMY    . TONSILLECTOMY    . TOTAL HIP ARTHROPLASTY Left 05/26/2013   Procedure: TOTAL HIP ARTHROPLASTY;  Surgeon: Newt Minion, MD;  Location: [facility];  Service: Orthopedics;  Laterality: Left;  Left Total Hip Arthroplasty, Removal Deep Hardware        Home Medications    Prior to Admission medications   Medication Sig Start Date End Date Taking? Authorizing Provider  albuterol (PROVENTIL HFA;VENTOLIN HFA) 108 (90 Base) MCG/ACT inhaler Inhale 2 puffs into the lungs every 6 (six) hours as needed for wheezing or shortness of breath. 01/06/18  Yes Tanda Rockers, MD  amLODipine (NORVASC) 5 MG tablet Take 5 mg by mouth daily.  12/31/17  Yes [provider]  Cholecalciferol (VITAMIN D PO) Take 1 capsule by mouth 2 (two) times daily.   Yes [provider]  ferrous sulfate 325 (65 FE) MG tablet Take 325 mg by mouth daily with breakfast.  12/18/17  Yes [provider]  fluticasone (FLONASE) 50 MCG/ACT nasal spray Place 2 sprays into both nostrils daily. 08/19/18  Yes Lesleigh Noe, MD  furosemide (LASIX) 20 MG tablet Take 1 tablet (20 mg total) by mouth daily as needed. Patient taking differently: Take 20 mg by mouth daily.  06/28/15  Yes Tanda Rockers, MD  irbesartan (AVAPRO) 300 MG tablet Take 300 mg by mouth daily.  12/28/17  Yes [provider]  ketoconazole (NIZORAL) 2 % shampoo Apply 1 application  topically as needed for irritation.  05/22/17  Yes [provider]  metoprolol succinate (TOPROL-XL) 25 MG 24 hr tablet Take 25 mg by mouth daily.   Yes [provider]  nitroGLYCERIN (NITROSTAT) 0.4 MG SL tablet Place 1 tablet (0.4 mg total) under the tongue every 5 (five) minutes x 3 doses as needed for chest pain. 01/31/14  Yes Isaiah Serge, NP  OXYGEN Inhale 2 L into the lungs continuous.    Yes [provider]  rosuvastatin (CRESTOR) 10 MG tablet Take 10 mg by mouth daily.    Yes [provider]  Calcium Carbonate-Simethicone (ALKA-SELTZER HEARTBURN + GAS) 750-80 MG CHEW Chew by mouth as needed.    [provider]  metFORMIN (GLUCOPHAGE) 500 MG tablet Take  500 mg by mouth 2 (two) times daily with a meal.  11/13/15   [provider]  omeprazole (PRILOSEC) 40 MG capsule Take 1 capsule (40 mg total) by mouth daily. 01/22/18   Mauri Pole, MD  tamsulosin (FLOMAX) 0.4 MG CAPS capsule Take 0.4 mg by mouth daily.  10/20/17   [provider]    Family History Family History  Problem Relation Age of Onset  . Osteoarthritis Mother   . Diabetes Mother   . Pulmonary embolism Mother   . Heart disease Father        Coronary Artery Disease  . Stroke Father   . Multiple sclerosis Sister   . Factor V Leiden deficiency Sister   . Emphysema Sister   . Hyperlipidemia Brother   . Lung cancer Maternal Grandfather        smoked  . Heart attack Maternal Grandmother     Social History Social History   Tobacco Use  . Smoking status: Former Smoker    Packs/day: 1.00    Years: 35.00    Pack years: 35.00    Types: Cigarettes    Last attempt to quit: 09/16/2006    Years since quitting: 12.1  . Smokeless tobacco: Never Used  Substance Use Topics  . Alcohol use: No    Alcohol/week: 0.0 standard drinks  . Drug use: No     Allergies   Patient has no known allergies.   Review of Systems Review of Systems  All other systems  reviewed and are negative.    Physical Exam Updated Vital Signs BP (!) 138/115 (BP Location: Right Arm)   Pulse 71   Temp 98.1 F (36.7 C) (Oral)   Resp 18   SpO2 97%   Physical Exam Vitals signs and nursing note reviewed.  Constitutional:      Appearance: He is well-developed.  HENT:     Head: Normocephalic and atraumatic.  Eyes:     General: No scleral icterus.       Right eye: No discharge.        Left eye: No discharge.     Conjunctiva/sclera: Conjunctivae normal.     Pupils: Pupils are equal, round, and reactive to light.  Neck:     Musculoskeletal: Normal range of motion.     Vascular: No JVD.     Trachea: No tracheal deviation.  Pulmonary:     Effort: Pulmonary effort is normal.     Breath sounds: No stridor.  Musculoskeletal:     Comments: Left back and hip atraumatic with no redness warmth or signs of trauma-tenderness palpation of the anterior and lateral aspects of the left hip this is minimal upon palpation-severe pain with internal and external rotation or flexion at the hip-tenderness to palpation of the lower lumbar midline soft tissue and gluteus- sensation intact to lower extremities   Neurological:     Mental Status: He is alert and oriented to person, place, and time.     Coordination: Coordination normal.  Psychiatric:        Behavior: Behavior normal.        Thought Content: Thought content normal.        Judgment: Judgment normal.      ED Treatments / Results  Labs (all labs ordered are listed, but only abnormal results are displayed) Labs Reviewed  CBC WITH DIFFERENTIAL/PLATELET - Abnormal; Notable for the following components:      Result Value   WBC 10.7 (*)    All other  components within normal limits  BASIC METABOLIC PANEL - Abnormal; Notable for the following components:   Glucose, Bld 149 (*)    All other components within normal limits  HIV ANTIBODY (ROUTINE TESTING W REFLEX)  CBC  CREATININE, SERUM     EKG None  Radiology Dg Lumbar Spine Complete  Result Date: 10/21/2018 CLINICAL DATA:  Lumbago EXAM: LUMBAR SPINE - COMPLETE 4+ VIEW COMPARISON:  None. FINDINGS: Frontal, lateral, spot lumbosacral lateral, and bilateral oblique views were obtained. There are 5 non-rib-bearing lumbar type vertebral bodies. There is no fracture or spondylolisthesis. There is moderate disc space narrowing at L4-5 and L5-S1. There are anterior osteophytes at all levels. Posterior osteophytes are noted at L4 and L5. No erosive changes are evident. There is facet osteoarthritic change all levels bilaterally, most notably at L5-S1 bilaterally. 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 replacement on the left. 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 No fracture or spondylolisthesis. There is aortoiliac atherosclerosis. Electronically Signed   By: Lowella Grip III M.D.   On: 10/21/2018 10:41   Dg Hip Unilat W Or Wo Pelvis 2-3 Views Left  Result Date: 10/21/2018 CLINICAL DATA:  Pain EXAM: DG HIP (WITH OR WITHOUT PELVIS) 2-3V LEFT COMPARISON:  CT abdomen and pelvis with bony reformats Jan 28, 2018 FINDINGS: Frontal pelvis as well as frontal and lateral left hip images were obtained. Patient is status post total hip replacement on the left with prosthetic components well-seated. No acute fracture or dislocation evident. There is moderate osteoarthritic change in the right hip joint, stable. No erosive changes are appreciable. Bony overgrowth along the right acetabulum is noted. There is calcification in the common and superficial femoral arteries bilaterally. IMPRESSION: No acute fracture or dislocation. Status post total hip replacement on the left with prosthetic components on the left well seated. Moderate arthropathy in the right hip joint, stable, with bony overgrowth along the right acetabulum. 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 No erosive  change. Multifocal arterial vascular calcification noted. Electronically Signed   By: Lowella Grip III M.D.   On: 10/21/2018 10:40    Procedures Procedures (including critical care time)  Medications Ordered in ED Medications  heparin injection 5,000 Units (has no administration in time range)  sodium chloride flush (NS) 0.9 % injection 3 mL (has no administration in time range)  sodium chloride flush (NS) 0.9 % injection 3 mL (has no administration in time range)  0.9 %  sodium chloride infusion (has no administration in time range)  oxyCODONE (Oxy IR/ROXICODONE) immediate release tablet 10 mg (has no administration in time range)  acetaminophen (TYLENOL) tablet 650 mg (has no administration in time range)  oxyCODONE (Oxy IR/ROXICODONE) immediate release tablet 5 mg (has no administration in time range)  ketorolac (TORADOL) 30 MG/ML injection 30 mg (has no administration in time range)  traZODone (DESYREL) tablet 50 mg (has no administration in time range)  polyethylene glycol (MIRALAX / GLYCOLAX) packet 17 g (has no administration in time range)  ondansetron (ZOFRAN) tablet 4 mg (has no administration in time range)    Or  ondansetron (ZOFRAN) injection 4 mg (has no administration in time range)  HYDROmorphone (DILAUDID) injection 0.5 mg (0.5 mg Intravenous Given 10/21/18 0937)  HYDROmorphone (DILAUDID) injection 0.5 mg (0.5 mg Intravenous Given 10/21/18 1132)     Initial Impression / Assessment and Plan / ED Course  I have reviewed the triage vital signs and the nursing notes.  Pertinent labs & imaging results that were available during my care of the patient were reviewed by me and considered in my medical decision making (see chart for details).      Final Clinical Impressions(s) / ED Diagnoses   Final diagnoses:  Pain of left hip joint       Assessment/Plan: 68 year old male presents today with complaints of severe left-sided hip pain.  Patient is status post total  hip.  He is unable to ambulate and is very uncomfortable in exam bed.  Patient will need imaging here anticipate hospital admission for pain management with orthopedic evaluation.  Pts imaging returns showing no acute findings within the left hip.  Patient does have multifocal arthropathy more severe at L4-L5 L5-S1 with no acute findings.  I discussed care with orthopedic specialist Hilbert Odor who discussed case with Dr. Sharol Given who personally reviewed the films indicating that no acute abnormalities of the hip were noted in question lower back etiology.  I have low suspicion for acute infection in this patient.  A discussed case with hospitalist will proceed with further imaging of the hip and lower back at this time given patient's severe pain and inability to ambulate secondary to this.      ED Discharge Orders    None       Francee Gentile 10/21/18 1433    Hayden Rasmussen, MD 10/21/18 [phone]

## 2018-10-21 NOTE — Progress Notes (Signed)
Patient to MRI via stretcher.

## 2018-10-21 NOTE — Progress Notes (Signed)
  RT Note:  Patient states he has CPAP machine at home but does not use it. Refused at this time.  Encouraged patient to call for Respiratory if he would like to use CPAP during his stat. Currently on 2 lpm [place].

## 2018-10-21 NOTE — ED Notes (Signed)
ED Provider at bedside. Merry Proud

## 2018-10-21 NOTE — ED Triage Notes (Signed)
Pt arrives by [facility] wioth c/o of left hip pain for 1 week. History of left hip replacement, Pt states pain begin while walking up a steep hill 1 week ago. Pt reports no pain while lying in bed- but extreme pain with certain positions and weight bearing.

## 2018-10-22 ENCOUNTER — Other Ambulatory Visit: Payer: Self-pay

## 2018-10-22 ENCOUNTER — Encounter (HOSPITAL_COMMUNITY): Payer: Self-pay

## 2018-10-22 DIAGNOSIS — G4733 Obstructive sleep apnea (adult) (pediatric): Secondary | ICD-10-CM | POA: Diagnosis not present

## 2018-10-22 DIAGNOSIS — J439 Emphysema, unspecified: Secondary | ICD-10-CM | POA: Diagnosis not present

## 2018-10-22 DIAGNOSIS — E1151 Type 2 diabetes mellitus with diabetic peripheral angiopathy without gangrene: Secondary | ICD-10-CM | POA: Diagnosis not present

## 2018-10-22 DIAGNOSIS — R911 Solitary pulmonary nodule: Secondary | ICD-10-CM | POA: Diagnosis present

## 2018-10-22 DIAGNOSIS — K760 Fatty (change of) liver, not elsewhere classified: Secondary | ICD-10-CM | POA: Diagnosis present

## 2018-10-22 DIAGNOSIS — M48061 Spinal stenosis, lumbar region without neurogenic claudication: Secondary | ICD-10-CM | POA: Diagnosis not present

## 2018-10-22 DIAGNOSIS — J449 Chronic obstructive pulmonary disease, unspecified: Secondary | ICD-10-CM | POA: Diagnosis not present

## 2018-10-22 DIAGNOSIS — J9611 Chronic respiratory failure with hypoxia: Secondary | ICD-10-CM | POA: Diagnosis not present

## 2018-10-22 DIAGNOSIS — E1165 Type 2 diabetes mellitus with hyperglycemia: Secondary | ICD-10-CM | POA: Diagnosis present

## 2018-10-22 DIAGNOSIS — E875 Hyperkalemia: Secondary | ICD-10-CM | POA: Diagnosis not present

## 2018-10-22 DIAGNOSIS — E662 Morbid (severe) obesity with alveolar hypoventilation: Secondary | ICD-10-CM | POA: Diagnosis not present

## 2018-10-22 DIAGNOSIS — I10 Essential (primary) hypertension: Secondary | ICD-10-CM | POA: Diagnosis not present

## 2018-10-22 DIAGNOSIS — G8929 Other chronic pain: Secondary | ICD-10-CM | POA: Diagnosis present

## 2018-10-22 DIAGNOSIS — E7800 Pure hypercholesterolemia, unspecified: Secondary | ICD-10-CM | POA: Diagnosis not present

## 2018-10-22 DIAGNOSIS — Z981 Arthrodesis status: Secondary | ICD-10-CM | POA: Diagnosis not present

## 2018-10-22 DIAGNOSIS — I2510 Atherosclerotic heart disease of native coronary artery without angina pectoris: Secondary | ICD-10-CM | POA: Diagnosis not present

## 2018-10-22 DIAGNOSIS — M4726 Other spondylosis with radiculopathy, lumbar region: Secondary | ICD-10-CM | POA: Diagnosis not present

## 2018-10-22 DIAGNOSIS — M549 Dorsalgia, unspecified: Secondary | ICD-10-CM | POA: Diagnosis present

## 2018-10-22 DIAGNOSIS — E785 Hyperlipidemia, unspecified: Secondary | ICD-10-CM | POA: Diagnosis not present

## 2018-10-22 DIAGNOSIS — M5136 Other intervertebral disc degeneration, lumbar region: Secondary | ICD-10-CM | POA: Diagnosis not present

## 2018-10-22 DIAGNOSIS — H9190 Unspecified hearing loss, unspecified ear: Secondary | ICD-10-CM | POA: Diagnosis present

## 2018-10-22 DIAGNOSIS — M5126 Other intervertebral disc displacement, lumbar region: Secondary | ICD-10-CM | POA: Diagnosis not present

## 2018-10-22 DIAGNOSIS — M109 Gout, unspecified: Secondary | ICD-10-CM | POA: Diagnosis present

## 2018-10-22 DIAGNOSIS — K5900 Constipation, unspecified: Secondary | ICD-10-CM | POA: Diagnosis not present

## 2018-10-22 DIAGNOSIS — K219 Gastro-esophageal reflux disease without esophagitis: Secondary | ICD-10-CM | POA: Diagnosis present

## 2018-10-22 DIAGNOSIS — Z9119 Patient's noncompliance with other medical treatment and regimen: Secondary | ICD-10-CM | POA: Diagnosis not present

## 2018-10-22 DIAGNOSIS — M5416 Radiculopathy, lumbar region: Secondary | ICD-10-CM | POA: Diagnosis not present

## 2018-10-22 DIAGNOSIS — M5116 Intervertebral disc disorders with radiculopathy, lumbar region: Secondary | ICD-10-CM | POA: Diagnosis not present

## 2018-10-22 DIAGNOSIS — D500 Iron deficiency anemia secondary to blood loss (chronic): Secondary | ICD-10-CM | POA: Diagnosis present

## 2018-10-22 DIAGNOSIS — R600 Localized edema: Secondary | ICD-10-CM | POA: Diagnosis not present

## 2018-10-22 DIAGNOSIS — Z6841 Body Mass Index (BMI) 40.0 and over, adult: Secondary | ICD-10-CM | POA: Diagnosis not present

## 2018-10-22 DIAGNOSIS — M25552 Pain in left hip: Secondary | ICD-10-CM | POA: Diagnosis not present

## 2018-10-22 DIAGNOSIS — M545 Low back pain: Secondary | ICD-10-CM | POA: Diagnosis present

## 2018-10-22 DIAGNOSIS — R0602 Shortness of breath: Secondary | ICD-10-CM | POA: Diagnosis not present

## 2018-10-22 LAB — GLUCOSE, CAPILLARY: Glucose-Capillary: 134 mg/dL — ABNORMAL HIGH (ref 70–99)

## 2018-10-22 LAB — HIV ANTIBODY (ROUTINE TESTING W REFLEX): HIV Screen 4th Generation wRfx: NONREACTIVE

## 2018-10-22 MED ORDER — TAMSULOSIN HCL 0.4 MG PO CAPS
0.4000 mg | ORAL_CAPSULE | Freq: Every day | ORAL | Status: DC
  Administered 2018-10-22 – 2018-10-30 (×9): 0.4 mg via ORAL
  Filled 2018-10-22 (×9): qty 1

## 2018-10-22 MED ORDER — IRBESARTAN 300 MG PO TABS
300.0000 mg | ORAL_TABLET | Freq: Every day | ORAL | Status: DC
  Administered 2018-10-22 – 2018-10-29 (×7): 300 mg via ORAL
  Filled 2018-10-22 (×7): qty 1

## 2018-10-22 MED ORDER — ROSUVASTATIN CALCIUM 5 MG PO TABS
10.0000 mg | ORAL_TABLET | Freq: Every day | ORAL | Status: DC
  Administered 2018-10-22 – 2018-10-30 (×9): 10 mg via ORAL
  Filled 2018-10-22 (×8): qty 2

## 2018-10-22 MED ORDER — PANTOPRAZOLE SODIUM 40 MG PO TBEC
40.0000 mg | DELAYED_RELEASE_TABLET | Freq: Every day | ORAL | Status: DC
  Administered 2018-10-22 – 2018-10-31 (×9): 40 mg via ORAL
  Filled 2018-10-22 (×9): qty 1

## 2018-10-22 MED ORDER — GABAPENTIN 300 MG PO CAPS
300.0000 mg | ORAL_CAPSULE | Freq: Three times a day (TID) | ORAL | Status: DC
  Administered 2018-10-22 – 2018-10-31 (×26): 300 mg via ORAL
  Filled 2018-10-22 (×26): qty 1

## 2018-10-22 MED ORDER — METOPROLOL SUCCINATE ER 25 MG PO TB24
25.0000 mg | ORAL_TABLET | Freq: Every day | ORAL | Status: DC
  Administered 2018-10-22 – 2018-10-31 (×9): 25 mg via ORAL
  Filled 2018-10-22 (×9): qty 1

## 2018-10-22 MED ORDER — AMLODIPINE BESYLATE 5 MG PO TABS
5.0000 mg | ORAL_TABLET | Freq: Every day | ORAL | Status: DC
  Administered 2018-10-22 – 2018-10-30 (×9): 5 mg via ORAL
  Filled 2018-10-22 (×9): qty 1

## 2018-10-22 MED ORDER — IPRATROPIUM-ALBUTEROL 0.5-2.5 (3) MG/3ML IN SOLN: 3.0000 mL | Freq: Four times a day (QID) | RESPIRATORY_TRACT | Status: DC | PRN

## 2018-10-22 MED ORDER — DEXAMETHASONE SODIUM PHOSPHATE 4 MG/ML IJ SOLN
6.0000 mg | Freq: Four times a day (QID) | INTRAMUSCULAR | Status: DC
  Administered 2018-10-22 – 2018-10-26 (×16): 6 mg via INTRAVENOUS
  Filled 2018-10-22 (×17): qty 1.5

## 2018-10-22 NOTE — Evaluation (Signed)
Physical Therapy Evaluation Patient Details Name: Alfred Carney MRN: [id number] DOB: 1950-10-09 Today's Date: 10/22/2018   History of Present Illness  Alfred Carney is a 68 yo male who presented to the ED with approximately 1 week history of pain which originates over his left buttock area and lower back. Significant PMH of L THA in 2014, chronic respiratory failure, morbid obesity, type 2 diabetes.   Clinical Impression   Pt received in bed, in high levels of pain, attempting to move to find "sweet spot" to reduce pain. Pt reporting pain "20 out of 10" and reports that he usually can stand pain, but this is the worst pain. Despite pain he was able to perform bed mobility, sit EOB, and stand without physical assistance, though significant use of bed rails. Because of his high levels of pain, did not feel safe to ambulate with pt today. At the moment, main limiting factor of pt's mobility is his high pain. Eduction provided on degenerative changes in back seen in imaging, and how these changes can cause pain to radiate down the leg. Pt could use some reinforcement on this education.  At this time, recommending SNF unless pain is resolved so that pt can safely regain his functional mobility before returning home. Want to prevent future hospital visit for same issue. If pain is resolved, recommend DC home with HHPT with 24 hour supervision.    Follow Up Recommendations SNF;Supervision/Assistance - 24 hour(unless pain resolves, recommending SNF)    Equipment Recommendations  Other (comment)(pt has all necessary equipment)    Recommendations for Other Services       Precautions / Restrictions Precautions Precautions: Fall Restrictions Weight Bearing Restrictions: No      Mobility  Bed Mobility Overal bed mobility: Modified Independent             General bed mobility comments: Pt able to move from supine to sit EOB without physical assistance, increased time, use of bed rail, HOB  elevated  Transfers Overall transfer level: Needs assistance Equipment used: Rolling walker (2 wheeled) Transfers: Sit to/from Stand Sit to Stand: Supervision         General transfer comment: Pt able to stand without physical assist; somewhat impulsive and pulled himself up from RW (wanted "to get it over with")- v/c to push off from bed; plop back to bed as pt in high level of pain  Ambulation/Gait Ambulation/Gait assistance: (did not assess due to safety concern due to high pain level)              Stairs            Wheelchair Mobility    Modified Rankin (Stroke Patients Only)       Balance Overall balance assessment: No apparent balance deficits (not formally assessed)                                           Pertinent Vitals/Pain Pain Assessment: 0-10 Pain Score: 10-Worst pain ever Pain Location: L hip/ upper leg Pain Descriptors / Indicators: Constant;Sharp Pain Intervention(s): Limited activity within patient's tolerance;RN gave pain meds during session;Repositioned    Home Living Family/patient expects to be discharged to:: Private residence Living Arrangements: Spouse/significant other Available Help at Discharge: Family;Available 24 hours/day Type of Home: House Home Access: Stairs to enter Entrance Stairs-Rails: Can reach both Entrance Stairs-Number of Steps: 3 Home Layout: One level Home Equipment:  Walker - 2 wheels;Cane - single point      Prior Function Level of Independence: Needs assistance   Gait / Transfers Assistance Needed: Independent prior to pain flare up, after pain flare up- totalA for transfers  ADL's / Homemaking Assistance Needed: Wife assisting with sock donning        Hand Dominance        Extremity/Trunk Assessment   Upper Extremity Assessment Upper Extremity Assessment: Overall WFL for tasks assessed    Lower Extremity Assessment Lower Extremity Assessment: Overall WFL for tasks  assessed(pain is the limiting factor in movement, otherwise able to move without difficulty)    Cervical / Trunk Assessment Cervical / Trunk Assessment: Normal  Communication   Communication: No difficulties  Cognition Arousal/Alertness: Awake/alert Behavior During Therapy: WFL for tasks assessed/performed Overall Cognitive Status: Within Functional Limits for tasks assessed                                        General Comments General comments (skin integrity, edema, etc.): Noted vascular skin changes on feet and lower legs    Exercises     Assessment/Plan    PT Assessment Patient needs continued PT services  PT Problem List Pain;Decreased mobility;Decreased activity tolerance       PT Treatment Interventions DME instruction;Functional mobility training;Patient/family education;Gait training;Therapeutic activities;Therapeutic exercise;Stair training    PT Goals (Current goals can be found in the Care Plan section)  Acute Rehab PT Goals Patient Stated Goal: stop hurting PT Goal Formulation: With patient/family Time For Goal Achievement: 11/05/18 Potential to Achieve Goals: Fair    Frequency Min 2X/week   Barriers to discharge Other (comment) pain is main limiting factor in mobility right now    Co-evaluation               AM-PAC PT "6 Clicks" Mobility  Outcome Measure Help needed turning from your back to your side while in a flat bed without using bedrails?: A Little Help needed moving from lying on your back to sitting on the side of a flat bed without using bedrails?: A Little Help needed moving to and from a bed to a chair (including a wheelchair)?: A Little Help needed standing up from a chair using your arms (e.g., wheelchair or bedside chair)?: None Help needed to walk in hospital room?: A Little Help needed climbing 3-5 steps with a railing? : A Lot 6 Click Score: 18    End of Session Equipment Utilized During Treatment: Gait  belt Activity Tolerance: Patient limited by pain Patient left: in bed;with call bell/phone within reach;with nursing/sitter in room Nurse Communication: Mobility status;Patient requests pain meds PT Visit Diagnosis: Pain;Difficulty in walking, not elsewhere classified (R26.2) Pain - Right/Left: Left Pain - part of body: Hip;Leg    Time:  -      Charges:              Ronnell Guadalajara, SPT    Ronnell Guadalajara 10/22/2018, 10:43 AM

## 2018-10-22 NOTE — Progress Notes (Signed)
Patient refused CPAP tonight. Informed patient if he changed his mind to have RN contact RT.

## 2018-10-22 NOTE — Progress Notes (Signed)
PROGRESS NOTE    Alfred Carney  FAO:[id number] DOB: [birth year] DOA: 10/21/2018 PCP: Lesleigh Noe, MD   Brief Narrative: Patient is a 68 year old male with past medical history of left total hip replacement, COPD, chronic respiratory failure on 2 L of oxygen at home, sleep apnea but not tolerating CPAP, multivessel coronary artery disease who presented to the emergency department with complaints of left-sided hip and back pain.  He was having shooting pain on his left upper thigh.  MRI of the lumbar spine showed L3-L4 left foraminal disc protrusion impinging left L3 nerve root, left-sided foraminal stenosis, surrounding edema, lumbar spondylosis with L4-L5 severe right foraminal stenosis.  Orthopedics and neurosurgery consulted.  Planning for conservative management but also possibility of ESI by IR.  PT evaluated him and recommended skilled nursing facility.  Assessment & Plan:   Principal Problem:   Left hip pain Active Problems:   Hyperlipidemia   Essential hypertension   COPD GOLD II   Morbid obesity due to excess calories (HCC)   Chronic respiratory failure with hypoxia (HCC)   Iron deficiency anemia due to chronic blood loss   Solitary pulmonary nodule on lung CT   Left hip pain: Secondary to referred pain from lumbar spine nerve impingement.  MRI findings as above.  Continue pain management.  Started on gabapentin as per neurosurgery.  Also started on Decadron.  Plan to taper Decadron in few days and stop.  Physical therapy evaluated him and recommended skilled nursing facility on discharge. Neurosurgery not planning for neurosurgical intervention but recommending ESI by IR.  IR consulted.  COPD: Currently stable.  He follows with pulmonology, Dr. Melvyn Novas.  On oxygen at 2 L/min.  Continue his home medications.  Hyperlipidemia: Continue statin  Hypertension: We will restart his home medications.  Currently blood pressure stable.  Solitary left pulmonary nodule on  CT: Being followed  by pulmonology.  Last CT scan on November 2019 showed 1.3 cm left lower lobe lung nodule which has remained unchanged from 01/2018. He  Is a past smoker.  Morbid obesity: He should focus on healthy diet and exercise after resolution of his back pain.          DVT prophylaxis: ScD Code Status: Full Family Communication: None present at the bedside Disposition Plan: Skilled nursing facility after full work-up and pain management   Consultants: Neurosurgery, IR Procedures: MRI  Antimicrobials:  Anti-infectives (From admission, onward)   None      Subjective: Patient seen and examined the bedside this morning.  Still complains of pain on the right lower extremity.  Pain has improved somewhat but is still there.  Objective: Vitals:   10/21/18 0939 10/21/18 1441 10/21/18 2155 10/22/18 0551  BP: (!) 138/115 (!) 154/69 114/82 124/60  Pulse: 71 61 62 60  Resp: 18 18 16 20   Temp:  98 F (36.7 C) 98.3 F (36.8 C) 98.2 F (36.8 C)  TempSrc:  Oral Oral Oral  SpO2: 97% 98% 92% 95%   No intake or output data in the 24 hours ending 10/22/18 1313 There were no vitals filed for this visit.  Examination:  General exam: In mild to moderate distress due to pain, morbidly obese HEENT:PERRL,Oral mucosa moist, Ear/Nose normal on gross exam Respiratory system: Bilateral equal air entry, normal vesicular breath sounds, no wheezes or crackles  Cardiovascular system: S1 & S2 heard, RRR. No JVD, murmurs, rubs, gallops or clicks. No pedal edema. Gastrointestinal system: Abdomen is nondistended, soft and nontender. No organomegaly or masses felt.  Normal bowel sounds heard. Central nervous system: Alert and oriented. No focal neurological deficits. Extremities: No edema, no clubbing ,no cyanosis, distal peripheral pulses palpable. Skin: No rashes, lesions or ulcers,no icterus ,no pallor MSK: Normal muscle bulk,tone ,power Psychiatry: Judgement and insight appear normal. Mood & affect  appropriate.     Data Reviewed: I have personally reviewed following labs and imaging studies  CBC: Recent Labs  Lab 10/21/18 1154  WBC 10.7*  NEUTROABS 7.6  HGB 16.4  HCT 50.5  MCV 90.2  PLT 416   Basic Metabolic Panel: Recent Labs  Lab 10/21/18 1154  NA 142  K 3.5  CL 103  CO2 25  GLUCOSE 149*  BUN 14  CREATININE 1.20  CALCIUM 9.4   GFR: CrCl cannot be calculated (Unknown ideal weight.). Liver Function Tests: No results for input(s): AST, ALT, ALKPHOS, BILITOT, PROT, ALBUMIN in the last 168 hours. No results for input(s): LIPASE, AMYLASE in the last 168 hours. No results for input(s): AMMONIA in the last 168 hours. Coagulation Profile: No results for input(s): INR, PROTIME in the last 168 hours. Cardiac Enzymes: No results for input(s): CKTOTAL, CKMB, CKMBINDEX, TROPONINI in the last 168 hours. BNP (last 3 results) Recent Labs    11/19/17 1556  PROBNP 79.0   HbA1C: No results for input(s): HGBA1C in the last 72 hours. CBG: Recent Labs  Lab 10/22/18 0555  GLUCAP 134*   Lipid Profile: No results for input(s): CHOL, HDL, LDLCALC, TRIG, CHOLHDL, LDLDIRECT in the last 72 hours. Thyroid Function Tests: No results for input(s): TSH, T4TOTAL, FREET4, T3FREE, THYROIDAB in the last 72 hours. Anemia Panel: No results for input(s): VITAMINB12, FOLATE, FERRITIN, TIBC, IRON, RETICCTPCT in the last 72 hours. Sepsis Labs: No results for input(s): PROCALCITON, LATICACIDVEN in the last 168 hours.  No results found for this or any previous visit (from the past 240 hour(s)).       Radiology Studies: Dg Lumbar Spine Complete  Result Date: 10/21/2018 CLINICAL DATA:  Lumbago EXAM: LUMBAR SPINE - COMPLETE 4+ VIEW COMPARISON:  None. FINDINGS: Frontal, lateral, spot lumbosacral lateral, and bilateral oblique views were obtained. There are 5 non-rib-bearing lumbar type vertebral bodies. There is no fracture or spondylolisthesis. There is moderate disc space narrowing at  L4-5 and L5-S1. There are anterior osteophytes at all levels. Posterior osteophytes are noted at L4 and L5. No erosive changes are evident. There is facet osteoarthritic change all levels bilaterally, most notably at L5-S1 bilaterally. 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 replacement on the left. 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 No fracture or spondylolisthesis. There is aortoiliac atherosclerosis. Electronically Signed   By: Lowella Grip III M.D.   On: 10/21/2018 10:41   Mr Lumbar Spine W Wo Contrast  Result Date: 10/21/2018 CLINICAL DATA:  68 y/o  M; left hip and back pain. EXAM: MRI LUMBAR SPINE WITHOUT AND WITH CONTRAST TECHNIQUE: Multiplanar and multiecho pulse sequences of the lumbar spine were obtained without and with intravenous contrast. CONTRAST:  10 cc Gadavist COMPARISON:  None. FINDINGS: Segmentation:  Standard. Alignment:  Physiologic. Vertebrae: No fracture, evidence of discitis, or bone lesion. No abnormal enhancement. Conus medullaris and cauda equina: Conus extends to the L1-2 level. Fatty filum. Otherwise conus and cauda equina appear normal. No abnormal enhancement. Paraspinal and other soft tissues: Negative. Disc levels: L1-2: No significant disc displacement, foraminal stenosis, or canal stenosis. L2-3: Mild disc bulge and facet hypertrophy. Mild bilateral foraminal stenosis. No canal stenosis. Right lateral and  anterior annular fissure. L3-4: Mild disc bulge and facet hypertrophy. 11 mm left foraminal disc protrusion with mild surrounding edema and enhancement, likely recent (series 7, image 14 and series 8, image 26). Protrusion impinges the exiting left L3 nerve root and results in severe left-sided foraminal stenosis. Mild right foraminal stenosis. No canal stenosis. L4-5: Disc bulge, mild facet hypertrophy, as well as right foraminal and extraforaminal disc protrusion with prominent endplate marginal osteophytes. Mild left  and severe right foraminal stenosis. Mild spinal canal stenosis and partial effacement of lateral recesses. L5-S1: Disc bulge with endplate marginal osteophytes eccentric to the left foraminal and extraforaminal zones combined with mild facet hypertrophy. Mild right and moderate left foraminal stenosis. No spinal canal stenosis. IMPRESSION: 1. No acute osseous abnormality or malalignment. 2. L3-4 left foraminal disc protrusion impinges the exiting left L3 nerve root and results in severe left-sided foraminal stenosis. Surrounding edema indicates the protrusion is likely recent. 3. Lumbar spondylosis with L4-5 severe right foraminal stenosis, L5-S1 moderate left foraminal stenosis, multilevel mild foraminal stenosis. No significant spinal canal stenosis. Electronically Signed   By: Kristine Garbe M.D.   On: 10/21/2018 18:27   Ct Hip Left Wo Contrast  Result Date: 10/21/2018 CLINICAL DATA:  Left hip pain for 1 week which began while walking up a steep hill. EXAM: CT OF THE LEFT HIP WITHOUT CONTRAST TECHNIQUE: Multidetector CT imaging of the left hip was performed according to the standard protocol. Multiplanar CT image reconstructions were also generated. COMPARISON:  Plain films left hip 10/21/2018 and CT abdomen and pelvis 01/28/2018 in 05/05/2014. FINDINGS: Bones/Joint/Cartilage Left hip replacement is identified with a single wire across the posterior aspect of the intertrochanteric femur, unchanged. The hip is located. No hardware complication is identified. There is no acute fracture. Ligaments Suboptimally assessed by CT. Muscles and Tendons Intact and normal in appearance. Soft tissues Intrapelvic contents demonstrate atherosclerosis. IMPRESSION: No acute abnormality or finding to explain the patient's pain. Left hip arthroplasty in place without complicating feature. Atherosclerosis. Electronically Signed   By: Inge Rise M.D.   On: 10/21/2018 14:34   Dg Hip Unilat W Or Wo Pelvis 2-3  Views Left  Result Date: 10/21/2018 CLINICAL DATA:  Pain EXAM: DG HIP (WITH OR WITHOUT PELVIS) 2-3V LEFT COMPARISON:  CT abdomen and pelvis with bony reformats Jan 28, 2018 FINDINGS: Frontal pelvis as well as frontal and lateral left hip images were obtained. Patient is status post total hip replacement on the left with prosthetic components well-seated. No acute fracture or dislocation evident. There is moderate osteoarthritic change in the right hip joint, stable. No erosive changes are appreciable. Bony overgrowth along the right acetabulum is noted. There is calcification in the common and superficial femoral arteries bilaterally. IMPRESSION: No acute fracture or dislocation. Status post total hip replacement on the left with prosthetic components on the left well seated. Moderate arthropathy in the right hip joint, stable, with bony overgrowth along the right acetabulum. 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 No erosive change. Multifocal arterial vascular calcification noted. Electronically Signed   By: Lowella Grip III M.D.   On: 10/21/2018 10:40        Scheduled Meds: . acetaminophen  650 mg Oral Q6H  . dexamethasone  6 mg Intravenous Q6H  . gabapentin  300 mg Oral TID  . sodium chloride flush  3 mL Intravenous Q12H   Continuous Infusions: . sodium chloride       LOS: 0 days  Time spent: 35 mins.More than 50% of that time was spent in counseling and/or coordination of care.      Shelly Coss, MD [facility] Pager [phone]  If 7PM-7AM, please contact night-coverage [email or web address] Password [email or web address] 10/22/2018, 1:13 PM

## 2018-10-22 NOTE — Consult Note (Signed)
Reason for Consult: Lumbar HNP Referring Physician: Dr. Shelly Coss  Alfred Carney is an 68 y.o. male.  HPI: Patient presented to the [facility] emergency room yesterday and was admitted to the triad hospitalist service for evaluation pain in the superior left buttock that extends into the left thigh.  He is status post a left total hip replacement by Dr. Sharol Given in 2014.  He is undergone work-up including orthopedic consultation.  It is felt that the left hip and left hip replacement is not the source of the pain.  He is undergone MRI of the lumbar spine without and with contrast, which shows multilevel lumbar degenerative disc disease and spondylosis, but most significantly a moderate to large left L3-4 extraforaminal disc herniation with left L3 nerve root compression.  Neurosurgical consultation was requested for further recommendations.  Patient and his wife explained that his symptoms began 12 days ago.  He had gone to his father's funeral, [place] the [place], and also climb down into his basement.  Subsequently he has been having disabling pain which has limited his mobility and activity.  He has difficulty bearing weight, and certainly difficulty walking because of the pain.  He does get relief of the pain when he lays down.  On the other hand he is not aware of specific weakness, numbness, or tingling.  Does have significant medical comorbidities including morbid obesity, COPD on home O2 (followed by Dr. Christinia Gully), obstructive sleep apnea (he was never able to tolerate CPAP), diabetes mellitus for the past several years, hypertension, a history of anemia (that was treated with iron and has resolved), and peripheral vascular disease (status post a suprarenal abdominal aortic aneurysm repair in 2003).  His primary physician is Dr. Waunita Schooner at [facility].  Past Medical History:  Past Medical History:  Diagnosis Date  . Chronic obstructive pulmonary disease ([place]) 2008    Moderate  . Colon polyps   . Community acquired pneumonia   . Coronary artery disease   . Degenerative joint disease   . Diabetes mellitus without complication ([place])    type 2  . Emphysema lung ([place])   . Enlarged liver    fatty liver by '09 CT  . Gastroesophageal reflux disease   . Gout   . H/O hiatal hernia   . History of [place] spotted fever    Possible history of [place] Spotted Fever  . Hyperlipidemia   . Hypertension   . Hypokalemia    diuretic induced, resolved  . Microcytic anemia    iron pills  . Morbid obesity ([place])   . Shortness of breath   . Skin cancer    skin cancer lip removed  . Sleep apnea    not currently using CPAP 05/20/13  . 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 CARDIAC CATHETERIZATION  2007   Ejection fraction is estimated at 60%  . CHOLECYSTECTOMY    . COLONOSCOPY WITH PROPOFOL N/A 01/22/2018   Procedure: COLONOSCOPY WITH PROPOFOL;  Surgeon: Mauri Pole, MD;  Location: [facility];  Service: Endoscopy;  Laterality: N/A;  . ESOPHAGOGASTRODUODENOSCOPY (EGD) WITH PROPOFOL N/A 01/22/2018   Procedure: ESOPHAGOGASTRODUODENOSCOPY (EGD) WITH PROPOFOL;  Surgeon: Mauri Pole, MD;  Location: [facility];  Service: Endoscopy;  Laterality: N/A;  . HARDWARE REMOVAL Left 05/26/2013   Procedure: HARDWARE REMOVAL;  Surgeon: Newt Minion, MD;  Location:  [place] OR;  Service: Orthopedics;  Laterality: Left;  Left Total Hip Arthroplasty, Removal of Deep Hardware  . HIP SURGERY     Status post left hip surgery with bone grafting  . LEFT HEART CATHETERIZATION WITH CORONARY ANGIOGRAM N/A 01/31/2014   Procedure: LEFT HEART CATHETERIZATION WITH CORONARY ANGIOGRAM;  Surgeon: Burnell Blanks, MD;  Location: [facility] CATH LAB;  Service: Cardiovascular;  Laterality: N/A;  . THORACOABDOMINAL AORTIC ANEURYSM REPAIR     with right femoral and  left iliac BPG and reimplantation of renal arteries.  . TONSILLECTOMY    . TONSILLECTOMY    . TOTAL HIP ARTHROPLASTY Left 05/26/2013   Procedure: TOTAL HIP ARTHROPLASTY;  Surgeon: Newt Minion, MD;  Location: [place];  Service: Orthopedics;  Laterality: Left;  Left Total Hip Arthroplasty, Removal Deep Hardware    Family History:  Family History  Problem Relation Age of Onset  . Osteoarthritis Mother   . Diabetes Mother   . Pulmonary embolism Mother   . Heart disease Father        Coronary Artery Disease  . Stroke Father   . Multiple sclerosis Sister   . Factor V Leiden deficiency Sister   . Emphysema Sister   . Hyperlipidemia Brother   . Lung cancer Maternal Grandfather        smoked  . Heart attack Maternal Grandmother     Social History:  reports that he quit smoking about 12 years ago. His smoking use included cigarettes. He has a 35.00 pack-year smoking history. He has never used smokeless tobacco. He reports that he does not drink alcohol or use drugs.  Allergies: No Known Allergies  Medications: I have reviewed the patient's current medications.  ROS: Notable for those difficulties described in his history of present illness and past medical history, but otherwise unremarkable.  Physical Examination: Patient is a morbidly obese white male in no acute distress.  Height 5 foot 8-1/2 inches.  Weight 312.5 pounds.  BMI 46.7. Blood pressure 124/60, pulse 60, temperature 98.2 F (36.8 C), temperature source Oral, resp. rate 20, SpO2 95 %. Lungs: There are to auscultation, symmetrical respiratory excursion. Heart: Regular rate and rhythm, no murmur. Abdomen: Soft, nondistended, bowel sounds present. Extremity: No clubbing, cyanosis, or edema. Musculoskeletal: Negative straight leg raising bilaterally.  No pain on internal/external rotation of the hips.  Neurological Examination: Mental Status Examination: Awake alert, fully oriented. Cranial Nerve Examination: Pupils equal,  round, reactive to light.  EOMI.  Facial movement symmetrical. Motor Examination: 5/5 strength in the iliopsoas, quadriceps, dorsiflexor, EHL, and plantar flexor bilaterally. Sensory Examination: Intact in the thighs, legs, and feet bilaterally. Reflex Examination:   Absent at the quadriceps and gastrocnemius bilaterally.  Toes downgoing bilaterally. Gait and Stance Examination: Gait and stance not tested, but disabling pain when trying to sit up on the edge of the bed.   Results for orders placed or performed during the hospital encounter of 10/21/18 (from the past 48 hour(s))  CBC with Differential     Status: Abnormal   Collection Time: 10/21/18 11:54 AM  Result Value Ref Range   WBC 10.7 (H) 4.0 - 10.5 K/uL   RBC 5.60 4.22 - 5.81 MIL/uL   Hemoglobin 16.4 13.0 - 17.0 g/dL   HCT 50.5 39.0 - 52.0 %   MCV 90.2 80.0 - 100.0 fL   MCH 29.3 26.0 - 34.0 pg   MCHC 32.5 30.0 - 36.0 g/dL   RDW 13.8 11.5 - 15.5 %   Platelets 189 150 -  400 K/uL   nRBC 0.0 0.0 - 0.2 %   Neutrophils Relative % 70 %   Neutro Abs 7.6 1.7 - 7.7 K/uL   Lymphocytes Relative 20 %   Lymphs Abs 2.1 0.7 - 4.0 K/uL   Monocytes Relative 9 %   Monocytes Absolute 0.9 0.1 - 1.0 K/uL   Eosinophils Relative 0 %   Eosinophils Absolute 0.0 0.0 - 0.5 K/uL   Basophils Relative 1 %   Basophils Absolute 0.1 0.0 - 0.1 K/uL   Immature Granulocytes 0 %   Abs Immature Granulocytes 0.02 0.00 - 0.07 K/uL    Comment: Performed at [facility] [address], [place], [place] [address]  Basic metabolic panel     Status: Abnormal   Collection Time: 10/21/18 11:54 AM  Result Value Ref Range   Sodium 142 135 - 145 mmol/L   Potassium 3.5 3.5 - 5.1 mmol/L   Chloride 103 98 - 111 mmol/L   CO2 25 22 - 32 mmol/L   Glucose, Bld 149 (H) 70 - 99 mg/dL   BUN 14 8 - 23 mg/dL   Creatinine, Ser 1.20 0.61 - 1.24 mg/dL   Calcium 9.4 8.9 - 10.3 mg/dL   GFR calc non Af Amer >60 >60 mL/min   GFR calc Af Amer >60 >60 mL/min   Anion gap 14 5  - 15    Comment: Performed at [facility], [place] [address] [place], [place] [address]  HIV antibody (Routine Testing)     Status: None   Collection Time: 10/21/18  2:47 PM  Result Value Ref Range   HIV Screen 4th Generation wRfx Non Reactive Non Reactive    Comment: (NOTE) Performed At: [facility] [address] [place], [place] [id number] Rush Farmer MD NU:[id number]   Glucose, capillary     Status: Abnormal   Collection Time: 10/22/18  5:55 AM  Result Value Ref Range   Glucose-Capillary 134 (H) 70 - 99 mg/dL    Dg Lumbar Spine Complete  Result Date: 10/21/2018 CLINICAL DATA:  Lumbago EXAM: LUMBAR SPINE - COMPLETE 4+ VIEW COMPARISON:  None. FINDINGS: Frontal, lateral, spot lumbosacral lateral, and bilateral oblique views were obtained. There are 5 non-rib-bearing lumbar type vertebral bodies. There is no fracture or spondylolisthesis. There is moderate disc space narrowing at L4-5 and L5-S1. There are anterior osteophytes at all levels. Posterior osteophytes are noted at L4 and L5. No erosive changes are evident. There is facet osteoarthritic change all levels bilaterally, most notably at L5-S1 bilaterally. 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 replacement on the left. 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 No fracture or spondylolisthesis. There is aortoiliac atherosclerosis. Electronically Signed   By: Lowella Grip III M.D.   On: 10/21/2018 10:41   Mr Lumbar Spine W Wo Contrast  Result Date: 10/21/2018 CLINICAL DATA:  68 y/o  M; left hip and back pain. EXAM: MRI LUMBAR SPINE WITHOUT AND WITH CONTRAST TECHNIQUE: Multiplanar and multiecho pulse sequences of the lumbar spine were obtained without and with intravenous contrast. CONTRAST:  10 cc Gadavist COMPARISON:  None. FINDINGS: Segmentation:  Standard. Alignment:  Physiologic. Vertebrae: No fracture, evidence of discitis, or bone lesion. No abnormal enhancement. Conus  medullaris and cauda equina: Conus extends to the L1-2 level. Fatty filum. Otherwise conus and cauda equina appear normal. No abnormal enhancement. Paraspinal and other soft tissues: Negative. Disc levels: L1-2: No significant disc displacement, foraminal stenosis, or canal stenosis. L2-3: Mild disc  bulge and facet hypertrophy. Mild bilateral foraminal stenosis. No canal stenosis. Right lateral and anterior annular fissure. L3-4: Mild disc bulge and facet hypertrophy. 11 mm left foraminal disc protrusion with mild surrounding edema and enhancement, likely recent (series 7, image 14 and series 8, image 26). Protrusion impinges the exiting left L3 nerve root and results in severe left-sided foraminal stenosis. Mild right foraminal stenosis. No canal stenosis. L4-5: Disc bulge, mild facet hypertrophy, as well as right foraminal and extraforaminal disc protrusion with prominent endplate marginal osteophytes. Mild left and severe right foraminal stenosis. Mild spinal canal stenosis and partial effacement of lateral recesses. L5-S1: Disc bulge with endplate marginal osteophytes eccentric to the left foraminal and extraforaminal zones combined with mild facet hypertrophy. Mild right and moderate left foraminal stenosis. No spinal canal stenosis. IMPRESSION: 1. No acute osseous abnormality or malalignment. 2. L3-4 left foraminal disc protrusion impinges the exiting left L3 nerve root and results in severe left-sided foraminal stenosis. Surrounding edema indicates the protrusion is likely recent. 3. Lumbar spondylosis with L4-5 severe right foraminal stenosis, L5-S1 moderate left foraminal stenosis, multilevel mild foraminal stenosis. No significant spinal canal stenosis. Electronically Signed   By: Kristine Garbe M.D.   On: 10/21/2018 18:27   Ct Hip Left Wo Contrast  Result Date: 10/21/2018 CLINICAL DATA:  Left hip pain for 1 week which began while walking up a steep hill. EXAM: CT OF THE LEFT HIP WITHOUT  CONTRAST TECHNIQUE: Multidetector CT imaging of the left hip was performed according to the standard protocol. Multiplanar CT image reconstructions were also generated. COMPARISON:  Plain films left hip 10/21/2018 and CT abdomen and pelvis 01/28/2018 in 05/05/2014. FINDINGS: Bones/Joint/Cartilage Left hip replacement is identified with a single wire across the posterior aspect of the intertrochanteric femur, unchanged. The hip is located. No hardware complication is identified. There is no acute fracture. Ligaments Suboptimally assessed by CT. Muscles and Tendons Intact and normal in appearance. Soft tissues Intrapelvic contents demonstrate atherosclerosis. IMPRESSION: No acute abnormality or finding to explain the patient's pain. Left hip arthroplasty in place without complicating feature. Atherosclerosis. Electronically Signed   By: Inge Rise M.D.   On: 10/21/2018 14:34   Dg Hip Unilat W Or Wo Pelvis 2-3 Views Left  Result Date: 10/21/2018 CLINICAL DATA:  Pain EXAM: DG HIP (WITH OR WITHOUT PELVIS) 2-3V LEFT COMPARISON:  CT abdomen and pelvis with bony reformats Jan 28, 2018 FINDINGS: Frontal pelvis as well as frontal and lateral left hip images were obtained. Patient is status post total hip replacement on the left with prosthetic components well-seated. No acute fracture or dislocation evident. There is moderate osteoarthritic change in the right hip joint, stable. No erosive changes are appreciable. Bony overgrowth along the right acetabulum is noted. There is calcification in the common and superficial femoral arteries bilaterally. IMPRESSION: No acute fracture or dislocation. Status post total hip replacement on the left with prosthetic components on the left well seated. Moderate arthropathy in the right hip joint, stable, with bony overgrowth along the right acetabulum. 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 No erosive change. Multifocal arterial vascular  calcification noted. Electronically Signed   By: Lowella Grip III M.D.   On: 10/21/2018 10:40     Assessment/Plan: Patient with a acute left lumbar radiculopathy secondary to a moderate to large left L3-4 extraforaminal disc herniation.  Multiple significant medical comorbidities as described above.  I discussed with the patient the nature of his condition and options for treatment and  care: Ranging from with the symptoms as they are, to considering nonsurgical management with gabapentin and spinal injections, to considering surgical intervention.  He strongly favors nonsurgical management, he would like to avoid surgical intervention.  With that in mind I have ordered him gabapentin 300 mg 3 times daily (although I told him that when he is home he could switch to 300 mg in the morning and 600 mg in the evening).  I have also spoken with radiology staff regarding arranging for a left L3-4 transforaminal epidural steroid injection (left L3 selective nerve root block).  I am waiting to hear back from them about how to place the order for this procedure.  In the meantime I have stopped the patient's subq heparin and have ordered SCDs for VTE prophylaxis.  I explained the patient that I will need to see him back in the office in a month or so to assess his response to treatment, and have given the patient and his wife my name, office phone number [phone]), and secretary's name Junie Panning).  Hosie Spangle, MD 10/22/2018, 12:16 PM

## 2018-10-22 NOTE — Consult Note (Signed)
Reason for Consult:Left buttock and leg pain Referring Physician: Dr. [name]  Yee Alfred Carney is an 68 y.o. male.  HPI:  The patient is a 68 year old gentleman with a past medical history of a left total hip replacement by Dr. Sharol Given in 2014, chronic respiratory failure, morbid obesity, type 2 diabetes who presented to the ED with approximately 1 week history of pain which originates over his left buttock area and lower back.  It started last week when he was at his father's funeral and he had to walk a great deal.  He reports the pain has progressively worsened and he was unable to ambulate without excruciating pain.  He presented to the ED due to the excruciating pain.  He reports nothing that he has tried has relieved the pain.  He reports the only relief he gets is if he lays completely flat in bed.  He denies bowel or bladder dysfunction.  He reports no overt weakness numbness or tingling.  He denies any falls. Work-up in the ED including a CT scan of the hip showed the patient's left hip arthroplasty to be in good position and alignment without evidence of component failure or evidence of lucency about the components consistent with loosening of the prosthetic. The patient was then sent for an MRI scan of his lumbar sacral spine which shows L3-4 left foraminal disc protrusion impinging on the exiting left L3 nerve root resulting in severe left sided foraminal stenosis.  There is surrounding edema indicating the protrusion is recent.  He also has lumbar spondylosis with L4-5 severe right foraminal stenosis, L L5-S1 moderate left foraminal stenosis and multilevel mild foraminal stenosis with no significant spinal canal stenosis noted.  Past Medical History:  Diagnosis Date  . Chronic obstructive pulmonary disease ([place]) 2008   Moderate  . Colon polyps   . Community acquired pneumonia   . Coronary artery disease   . Degenerative joint disease   . Diabetes mellitus without complication ([place])     type 2  . Emphysema lung ([place])   . Enlarged liver    fatty liver by '09 CT  . Gastroesophageal reflux disease   . Gout   . H/O hiatal hernia   . History of [place] spotted fever    Possible history of [place] Spotted Fever  . Hyperlipidemia   . Hypertension   . Hypokalemia    diuretic induced, resolved  . Microcytic anemia    iron pills  . Morbid obesity ([place])   . Shortness of breath   . Skin cancer    skin cancer lip removed  . Sleep apnea    not currently using CPAP 05/20/13  . 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 CARDIAC CATHETERIZATION  2007   Ejection fraction is estimated at 60%  . CHOLECYSTECTOMY    . COLONOSCOPY WITH PROPOFOL N/A 01/22/2018   Procedure: COLONOSCOPY WITH PROPOFOL;  Surgeon: Mauri Pole, MD;  Location: [facility];  Service: Endoscopy;  Laterality: N/A;  . ESOPHAGOGASTRODUODENOSCOPY (EGD) WITH PROPOFOL N/A 01/22/2018   Procedure: ESOPHAGOGASTRODUODENOSCOPY (EGD) WITH PROPOFOL;  Surgeon: Mauri Pole, MD;  Location: [facility];  Service: Endoscopy;  Laterality: N/A;  . HARDWARE REMOVAL Left 05/26/2013   Procedure: HARDWARE REMOVAL;  Surgeon: Newt Minion, MD;  Location: [facility];  Service: Orthopedics;  Laterality: Left;  Left Total Hip Arthroplasty, Removal of Deep Hardware  . HIP SURGERY  Status post left hip surgery with bone grafting  . LEFT HEART CATHETERIZATION WITH CORONARY ANGIOGRAM N/A 01/31/2014   Procedure: LEFT HEART CATHETERIZATION WITH CORONARY ANGIOGRAM;  Surgeon: Burnell Blanks, MD;  Location: [facility] CATH LAB;  Service: Cardiovascular;  Laterality: N/A;  . THORACOABDOMINAL AORTIC ANEURYSM REPAIR     with right femoral and left iliac BPG and reimplantation of renal arteries.  . TONSILLECTOMY    . TONSILLECTOMY    . TOTAL HIP ARTHROPLASTY Left 05/26/2013   Procedure: TOTAL HIP ARTHROPLASTY;  Surgeon: Newt Minion,  MD;  Location: [facility];  Service: Orthopedics;  Laterality: Left;  Left Total Hip Arthroplasty, Removal Deep Hardware    Family History  Problem Relation Age of Onset  . Osteoarthritis Mother   . Diabetes Mother   . Pulmonary embolism Mother   . Heart disease Father        Coronary Artery Disease  . Stroke Father   . Multiple sclerosis Sister   . Factor V Leiden deficiency Sister   . Emphysema Sister   . Hyperlipidemia Brother   . Lung cancer Maternal Grandfather        smoked  . Heart attack Maternal Grandmother     Social History:  reports that he quit smoking about 12 years ago. His smoking use included cigarettes. He has a 35.00 pack-year smoking history. He has never used smokeless tobacco. He reports that he does not drink alcohol or use drugs.  Allergies: No Known Allergies  Medications: I have reviewed the patient's current medications.  Results for orders placed or performed during the hospital encounter of 10/21/18 (from the past 48 hour(s))  CBC with Differential     Status: Abnormal   Collection Time: 10/21/18 11:54 AM  Result Value Ref Range   WBC 10.7 (H) 4.0 - 10.5 K/uL   RBC 5.60 4.22 - 5.81 MIL/uL   Hemoglobin 16.4 13.0 - 17.0 g/dL   HCT 50.5 39.0 - 52.0 %   MCV 90.2 80.0 - 100.0 fL   MCH 29.3 26.0 - 34.0 pg   MCHC 32.5 30.0 - 36.0 g/dL   RDW 13.8 11.5 - 15.5 %   Platelets 189 150 - 400 K/uL   nRBC 0.0 0.0 - 0.2 %   Neutrophils Relative % 70 %   Neutro Abs 7.6 1.7 - 7.7 K/uL   Lymphocytes Relative 20 %   Lymphs Abs 2.1 0.7 - 4.0 K/uL   Monocytes Relative 9 %   Monocytes Absolute 0.9 0.1 - 1.0 K/uL   Eosinophils Relative 0 %   Eosinophils Absolute 0.0 0.0 - 0.5 K/uL   Basophils Relative 1 %   Basophils Absolute 0.1 0.0 - 0.1 K/uL   Immature Granulocytes 0 %   Abs Immature Granulocytes 0.02 0.00 - 0.07 K/uL    Comment: Performed at [facility], [address] [place], [place] [address]  Basic metabolic panel     Status: Abnormal   Collection  Time: 10/21/18 11:54 AM  Result Value Ref Range   Sodium 142 135 - 145 mmol/L   Potassium 3.5 3.5 - 5.1 mmol/L   Chloride 103 98 - 111 mmol/L   CO2 25 22 - 32 mmol/L   Glucose, Bld 149 (H) 70 - 99 mg/dL   BUN 14 8 - 23 mg/dL   Creatinine, Ser 1.20 0.61 - 1.24 mg/dL   Calcium 9.4 8.9 - 10.3 mg/dL   GFR calc non Af Amer >60 >60 mL/min   GFR calc Af Amer >  60 >60 mL/min   Anion gap 14 5 - 15    Comment: Performed at [facility] [address] [place], [place] [address]  HIV antibody (Routine Testing)     Status: None   Collection Time: 10/21/18  2:47 PM  Result Value Ref Range   HIV Screen 4th Generation wRfx Non Reactive Non Reactive    Comment: (NOTE) Performed At: [facility] [address] [place], [place] [id number] Rush Farmer MD FX:[id number]   Glucose, capillary     Status: Abnormal   Collection Time: 10/22/18  5:55 AM  Result Value Ref Range   Glucose-Capillary 134 (H) 70 - 99 mg/dL    Dg Lumbar Spine Complete  Result Date: 10/21/2018 CLINICAL DATA:  Lumbago EXAM: LUMBAR SPINE - COMPLETE 4+ VIEW COMPARISON:  None. FINDINGS: Frontal, lateral, spot lumbosacral lateral, and bilateral oblique views were obtained. There are 5 non-rib-bearing lumbar type vertebral bodies. There is no fracture or spondylolisthesis. There is moderate disc space narrowing at L4-5 and L5-S1. There are anterior osteophytes at all levels. Posterior osteophytes are noted at L4 and L5. No erosive changes are evident. There is facet osteoarthritic change all levels bilaterally, most notably at L5-S1 bilaterally. 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 replacement on the left. 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 No fracture or spondylolisthesis. There is aortoiliac atherosclerosis. Electronically Signed   By: Lowella Grip III M.D.   On: 10/21/2018 10:41   Mr Lumbar Spine W Wo Contrast  Result Date: 10/21/2018 CLINICAL DATA:  68 y/o  M;  left hip and back pain. EXAM: MRI LUMBAR SPINE WITHOUT AND WITH CONTRAST TECHNIQUE: Multiplanar and multiecho pulse sequences of the lumbar spine were obtained without and with intravenous contrast. CONTRAST:  10 cc Gadavist COMPARISON:  None. FINDINGS: Segmentation:  Standard. Alignment:  Physiologic. Vertebrae: No fracture, evidence of discitis, or bone lesion. No abnormal enhancement. Conus medullaris and cauda equina: Conus extends to the L1-2 level. Fatty filum. Otherwise conus and cauda equina appear normal. No abnormal enhancement. Paraspinal and other soft tissues: Negative. Disc levels: L1-2: No significant disc displacement, foraminal stenosis, or canal stenosis. L2-3: Mild disc bulge and facet hypertrophy. Mild bilateral foraminal stenosis. No canal stenosis. Right lateral and anterior annular fissure. L3-4: Mild disc bulge and facet hypertrophy. 11 mm left foraminal disc protrusion with mild surrounding edema and enhancement, likely recent (series 7, image 14 and series 8, image 26). Protrusion impinges the exiting left L3 nerve root and results in severe left-sided foraminal stenosis. Mild right foraminal stenosis. No canal stenosis. L4-5: Disc bulge, mild facet hypertrophy, as well as right foraminal and extraforaminal disc protrusion with prominent endplate marginal osteophytes. Mild left and severe right foraminal stenosis. Mild spinal canal stenosis and partial effacement of lateral recesses. L5-S1: Disc bulge with endplate marginal osteophytes eccentric to the left foraminal and extraforaminal zones combined with mild facet hypertrophy. Mild right and moderate left foraminal stenosis. No spinal canal stenosis. IMPRESSION: 1. No acute osseous abnormality or malalignment. 2. L3-4 left foraminal disc protrusion impinges the exiting left L3 nerve root and results in severe left-sided foraminal stenosis. Surrounding edema indicates the protrusion is likely recent. 3. Lumbar spondylosis with L4-5 severe  right foraminal stenosis, L5-S1 moderate left foraminal stenosis, multilevel mild foraminal stenosis. No significant spinal canal stenosis. Electronically Signed   By: Kristine Garbe M.D.   On: 10/21/2018 18:27   Ct Hip Left Wo Contrast  Result Date: 10/21/2018 CLINICAL DATA:  Left hip pain for 1 week which began while walking up a steep hill. EXAM: CT OF THE LEFT HIP WITHOUT CONTRAST TECHNIQUE: Multidetector CT imaging of the left hip was performed according to the standard protocol. Multiplanar CT image reconstructions were also generated. COMPARISON:  Plain films left hip 10/21/2018 and CT abdomen and pelvis 01/28/2018 in 05/05/2014. FINDINGS: Bones/Joint/Cartilage Left hip replacement is identified with a single wire across the posterior aspect of the intertrochanteric femur, unchanged. The hip is located. No hardware complication is identified. There is no acute fracture. Ligaments Suboptimally assessed by CT. Muscles and Tendons Intact and normal in appearance. Soft tissues Intrapelvic contents demonstrate atherosclerosis. IMPRESSION: No acute abnormality or finding to explain the patient's pain. Left hip arthroplasty in place without complicating feature. Atherosclerosis. Electronically Signed   By: Inge Rise M.D.   On: 10/21/2018 14:34   Dg Hip Unilat W Or Wo Pelvis 2-3 Views Left  Result Date: 10/21/2018 CLINICAL DATA:  Pain EXAM: DG HIP (WITH OR WITHOUT PELVIS) 2-3V LEFT COMPARISON:  CT abdomen and pelvis with bony reformats Jan 28, 2018 FINDINGS: Frontal pelvis as well as frontal and lateral left hip images were obtained. Patient is status post total hip replacement on the left with prosthetic components well-seated. No acute fracture or dislocation evident. There is moderate osteoarthritic change in the right hip joint, stable. No erosive changes are appreciable. Bony overgrowth along the right acetabulum is noted. There is calcification in the common and superficial femoral  arteries bilaterally. IMPRESSION: No acute fracture or dislocation. Status post total hip replacement on the left with prosthetic components on the left well seated. Moderate arthropathy in the right hip joint, stable, with bony overgrowth along the right acetabulum. 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 No erosive change. Multifocal arterial vascular calcification noted. Electronically Signed   By: Lowella Grip III M.D.   On: 10/21/2018 10:40    Review of Systems  All other systems reviewed and are negative.  Blood pressure 124/60, pulse 60, temperature 98.2 F (36.8 C), temperature source Oral, resp. rate 20, SpO2 95 %. Physical Exam  Constitutional: He is oriented to person, place, and time.  Obese elderly male lying flat in bed currently comfortable but complaining of left buttock pain which radiates to the knee and upper calf area.  HENT:  Head: Normocephalic and atraumatic.  Musculoskeletal:     Comments: Left hip with good range of motion and pain free range of motion at the left hip. He has good strength and EHL is intact and symmetric with right side. Weakly positive SLR. Palpable pedal pulse.   Neurological: He is alert and oriented to person, place, and time. No cranial nerve deficit.  Skin: Skin is warm and dry.  Psychiatric: He has a normal mood and affect. His behavior is normal. Judgment and thought content normal.    Assessment/Plan: The patient's pain does not appear to be related to his previous left total hip arthroplasty which has good position alignment and is without evidence for loosening of the prosthesis.  Feel the patient's pain is related to his L3-4 left foraminal disc protrusion which apparently appears recent.   Would recommend neurosurgical consultation.  May also benefit from steroid taper pain control and mobilization with therapies. He can follow up with Dr. Sharol Given in several weeks in the office.  Erlinda Hong ,  PA-C 10/22/2018, 8:05 AM  [employer] [phone]

## 2018-10-23 ENCOUNTER — Inpatient Hospital Stay (HOSPITAL_COMMUNITY): Payer: Medicare HMO

## 2018-10-23 ENCOUNTER — Encounter (HOSPITAL_COMMUNITY): Payer: Self-pay | Admitting: Diagnostic Radiology

## 2018-10-23 HISTORY — PX: IR INJECT/THERA/INC NEEDLE/CATH/PLC EPI/LUMB/SAC W/IMG: IMG6130

## 2018-10-23 HISTORY — PX: DG NERVE ROOT BLOCK LUMBAR-SACRAL EACH ADD. LEVEL: IMG5336

## 2018-10-23 MED ORDER — SODIUM CHLORIDE (PF) 0.9 % IJ SOLN
INTRAMUSCULAR | Status: AC
  Administered 2018-10-23: 23:00:00
  Filled 2018-10-23: qty 10

## 2018-10-23 MED ORDER — IOPAMIDOL (ISOVUE-M 200) INJECTION 41%
INTRAMUSCULAR | Status: AC
  Administered 2018-10-23: 2 mL
  Filled 2018-10-23: qty 10

## 2018-10-23 MED ORDER — METHYLPREDNISOLONE ACETATE 80 MG/ML IJ SUSP
INTRAMUSCULAR | Status: AC
  Administered 2018-10-23: 80 mg
  Filled 2018-10-23: qty 1

## 2018-10-23 MED ORDER — LIDOCAINE HCL (PF) 1 % IJ SOLN
INTRAMUSCULAR | Status: DC | PRN
  Administered 2018-10-23: 10 mL

## 2018-10-23 MED ORDER — LIDOCAINE HCL (PF) 1 % IJ SOLN
INTRAMUSCULAR | Status: AC
  Administered 2018-10-23: 23:00:00
  Filled 2018-10-23: qty 30

## 2018-10-23 MED ORDER — METHYLPREDNISOLONE ACETATE 40 MG/ML IJ SUSP
INTRAMUSCULAR | Status: AC
  Administered 2018-10-23: 40 mg
  Filled 2018-10-23: qty 1

## 2018-10-23 NOTE — Progress Notes (Signed)
Vitals:   10/22/18 1500 10/22/18 2007 10/23/18 0525 10/23/18 0538  BP: (!) 152/71 (!) 145/59  139/60  Pulse: 94 (!) 57  60  Resp:  18  18  Temp: 98.7 F (37.1 C) 97.6 F (36.4 C)  (!) 97.4 F (36.3 C)  TempSrc: Oral Oral  Oral  SpO2: 94% 91%  96%  Weight:   134.7 kg   Height:   5\' 9"  (1.753 m)     CBC Recent Labs    10/21/18 1154  WBC 10.7*  HGB 16.4  HCT 50.5  PLT 189   BMET Recent Labs    10/21/18 1154  NA 142  K 3.5  CL 103  CO2 25  GLUCOSE 149*  BUN 14  CREATININE 1.20  CALCIUM 9.4    Patient continues to have varying amount of left lumbar radicular pain.  He was started on gabapentin 3 mg 3 times daily.  Left L3-4 transforaminal ESI (left L3 selective nerve root block) was been requested yesterday, and will be done today by Dr. Nelson Chimes from neuroradiology.  Examination shows good strength, although it is difficult for him to exert full effort with the left lower extremity.  Plan: As pain lessens, patient will require physical therapy to work with him on transfers, ambulation, etc.  Patient wants to continue to treat this left L3-4 extraforaminal disc herniation nonsurgically.  Hosie Spangle, MD 10/23/2018, 9:05 AM

## 2018-10-23 NOTE — Progress Notes (Addendum)
   Patient Status: [facility] - In-pt  Assessment and Plan: Lumbar radiculopathy, left L3-L4 extrafoaminal disc herniation. Patient evaluated by neurosurgery for pain management.  He elects for epidural steroid injection.  Dr. Maree Erie has reviewed imaging and approves patient for procedure today.  Heparin has been held appropriately.  Plan for procedure today as schedule allows.   Reviewed risks and benefits with patient.  Consent signed and in chart.  ______________________________________________________________________   History of Present Illness: Alfred Carney is a 68 y.o. male with chronic respiratory failure, DM who presented to [facility] ED with hip and back pain which has progressively worsened. IR consulted for ESI.   Allergies and medications reviewed.   Review of Systems: A 12 point ROS discussed and pertinent positives are indicated in the HPI above.  All other systems are negative.  Review of Systems  Constitutional: Negative for fatigue and fever.  Respiratory: Negative for cough and shortness of breath.   Cardiovascular: Negative for chest pain.  Gastrointestinal: Negative for abdominal pain.  Musculoskeletal: Positive for back pain.  Psychiatric/Behavioral: Negative for behavioral problems and confusion.    Vital Signs: BP 134/60 (BP Location: Right Arm)   Pulse 75   Temp 98.1 F (36.7 C) (Oral)   Resp 17   Ht 5\' 9"  (1.753 m)   Wt 296 lb 14.4 oz (134.7 kg)   SpO2 94%   BMI 43.84 kg/m   Physical Exam Vitals signs and nursing note reviewed.  Constitutional:      General: He is not in acute distress.    Appearance: He is not ill-appearing.  Cardiovascular:     Rate and Rhythm: Normal rate.  Pulmonary:     Effort: Pulmonary effort is normal.     Breath sounds: Normal breath sounds.  Musculoskeletal: Normal range of motion.        General: Tenderness (left-sided pain with movement) present.  Neurological:     Mental Status: He is alert and oriented to person,  place, and time.  Psychiatric:        Mood and Affect: Mood normal.        Behavior: Behavior normal.        Thought Content: Thought content normal.        Judgment: Judgment normal.      Imaging reviewed.   Labs:  COAGS: No results for input(s): INR, APTT in the last 8760 hours.  BMP: Recent Labs    11/19/17 1556 10/21/18 1154  NA 143 142  K 4.2 3.5  CL 103 103  CO2 30 25  GLUCOSE 124* 149*  BUN 17 14  CALCIUM 9.5 9.4  CREATININE 1.34 1.20  GFRNONAA  --  >60  GFRAA  --  >60       Electronically Signed: Docia Barrier, PA 10/23/2018, 11:31 AM   I spent a total of 15 minutes in face to face in clinical consultation, greater than 50% of which was counseling/coordinating care for venous access.

## 2018-10-23 NOTE — Progress Notes (Signed)
OT Cancellation Note  Patient Details Name: Alfred Carney MRN: [id number] DOB: Oct 18, 1950   Cancelled Treatment:    Reason Eval/Treat Not Completed: Patient at procedure or test/ unavailable(off the floor at [facility])  [place] 10/23/2018, 2:16 PM   [name] OTR/L Acute Rehabilitation Services Pager: [phone] Office: [phone]

## 2018-10-23 NOTE — Plan of Care (Signed)

## 2018-10-23 NOTE — Procedures (Signed)
Left L3 nerve root block and tf epidural.   120 mg depomedrol and 2cc 1% lidocaine injected in left L3/4 foramen.  Reproduction of concordant pain.  Well tolerated.  No additional post injection orders.

## 2018-10-23 NOTE — Progress Notes (Signed)
Pt refusing CPAP tonight.

## 2018-10-23 NOTE — Progress Notes (Signed)
Physical Therapy Treatment Patient Details Name: Alfred Carney MRN: [id number] DOB: July 17, 1951 Today's Date: 10/23/2018    History of Present Illness [name] is a 68 yo male who presented to the ED with approximately 1 week history of pain which originates over his left buttock area and lower back. Significant PMH of L THA in 2014, chronic respiratory failure, morbid obesity, type 2 diabetes.    PT Comments    Patient with pain somewhat improved today and able to ambulate some as well as provided education in spinal precautions to help with pain and nerve flossing technique while in supine.  Patient will need HHPT upon d/c, feel stable enough despite continued pain to mobilize with family support upon d/c.    Follow Up Recommendations  Home health PT     Equipment Recommendations  None recommended by PT    Recommendations for Other Services       Precautions / Restrictions Precautions Precautions: Fall Precaution Comments: Teaching back precautions due to pain with disc herniation    Mobility  Bed Mobility Overal bed mobility: Needs Assistance Bed Mobility: Rolling;Sidelying to Sit;Sit to Supine Rolling: Supervision Sidelying to sit: Min assist   Sit to supine: Modified independent (Device/Increase time)   General bed mobility comments: cues for spinal precautions and assist to lift trunk from R sidelying; sit to supine unaided  Transfers Overall transfer level: Needs assistance Equipment used: Rolling walker (2 wheeled) Transfers: Sit to/from Stand Sit to Stand: Min guard         General transfer comment: initially dizzy upon sitting, sat several minutes, stood with assist for balance  Ambulation/Gait Ambulation/Gait assistance: Min guard Gait Distance (Feet): 80 Feet Assistive device: Rolling walker (2 wheeled) Gait Pattern/deviations: Step-through pattern;Antalgic;Decreased stride length     General Gait Details: antalgic on L, reports pain worsens with  ambulation   Stairs             Wheelchair Mobility    Modified Rankin (Stroke Patients Only)       Balance Overall balance assessment: Mild deficits observed, not formally tested                                          Cognition Arousal/Alertness: Awake/alert Behavior During Therapy: WFL for tasks assessed/performed Overall Cognitive Status: Within Functional Limits for tasks assessed                                        Exercises Other Exercises Other Exercises: L LE nerve flossing technique; ankle pumps, heel slides, then hip stable with knee extension x about 5 reps, educated to perform for mobilization of nerve to improve comfort during mobility    General Comments General comments (skin integrity, edema, etc.): reports had time this am painfree around 7 am, was able to get up to bathroom on his own      Pertinent Vitals/Pain Pain Assessment: Faces Faces Pain Scale: Hurts even more Pain Location: L hip posterior thigh Pain Descriptors / Indicators: Aching;Sharp Pain Intervention(s): Monitored during session;Limited activity within patient's tolerance;Repositioned    Home Living                      Prior Function            PT Goals (current  goals can now be found in the care plan section) Progress towards PT goals: Progressing toward goals    Frequency    Min 3X/week      PT Plan Discharge plan needs to be updated;Frequency needs to be updated    Co-evaluation              AM-PAC PT "6 Clicks" Mobility   Outcome Measure  Help needed turning from your back to your side while in a flat bed without using bedrails?: None Help needed moving from lying on your back to sitting on the side of a flat bed without using bedrails?: A Little Help needed moving to and from a bed to a chair (including a wheelchair)?: A Little Help needed standing up from a chair using your arms (e.g., wheelchair or  bedside chair)?: A Little Help needed to walk in hospital room?: A Little Help needed climbing 3-5 steps with a railing? : A Little 6 Click Score: 19    End of Session   Activity Tolerance: Patient limited by pain Patient left: in bed;with call bell/phone within reach   PT Visit Diagnosis: Pain;Difficulty in walking, not elsewhere classified (R26.2) Pain - Right/Left: Left Pain - part of body: Hip;Leg     Time: 8889-1694 PT Time Calculation (min) (ACUTE ONLY): 24 min  Charges:  $Gait Training: 8-22 mins $Therapeutic Exercise: 8-22 mins                     Magda Kiel, [place] [phone] 10/23/2018    Reginia Naas 10/23/2018, 9:31 AM

## 2018-10-23 NOTE — Progress Notes (Signed)
PROGRESS NOTE    Alfred Carney  PYP:[id number] DOB: 11-12-1950 DOA: 10/21/2018 PCP: Lesleigh Noe, MD   Brief Narrative: Patient is a 68 year old male with past medical history of left total hip replacement, COPD, chronic respiratory failure on 2 L of oxygen at home, sleep apnea but not tolerating CPAP, multivessel coronary artery disease who presented to the emergency department with complaints of left-sided hip and back pain.  He was having shooting pain on his left upper thigh.  MRI of the lumbar spine showed L3-L4 left foraminal disc protrusion impinging left L3 nerve root, left-sided foraminal stenosis, surrounding edema, lumbar spondylosis with L4-L5 severe right foraminal stenosis.  Orthopedics and neurosurgery consulted.  Planning for conservative management but also possibility of ESI by IR.  PT evaluated him and recommended skilled nursing facility.  10/23/2018: Patient underwent blocking of the left L3 nerve root earlier today.  Patient had discussed with the neurosurgeon that he would prefer the disc problem and radiculopathy managed medically.  Patient seems to be having change of mind.  Patient intends to discuss operative options further with the neurosurgical team.  Apparently, patient's 2 siblings have had back surgery previously.  No pain reported at the moment.  Assessment & Plan:   Principal Problem:   Left hip pain Active Problems:   Hyperlipidemia   Essential hypertension   COPD GOLD II   Morbid obesity due to excess calories (HCC)   Chronic respiratory failure with hypoxia (HCC)   Iron deficiency anemia due to chronic blood loss   Solitary pulmonary nodule on lung CT   Back pain   Left hip pain: Secondary to referred pain from lumbar spine nerve impingement.  MRI findings as above.  Continue pain management.  Started on gabapentin as per neurosurgery.  Also started on Decadron.  Plan to taper Decadron in few days and stop.  Physical therapy evaluated him and recommended  skilled nursing facility on discharge. Neurosurgery not planning for neurosurgical intervention but recommending ESI by IR.  IR consulted. 10/23/2018: Patient underwent blocking of the left L3 nerve root earlier today.  Patient had discussed with the neurosurgeon that he would prefer the disc problem and radiculopathy managed medically.  Patient seems to be having change of mind.  Patient intends to discuss operative options further with the neurosurgical team.  Apparently, patient's 2 siblings have had back surgery previously.  No pain reported at the moment.   COPD: Currently stable.  He follows with pulmonology, Dr. Melvyn Novas.  On oxygen at 2 L/min.  Continue his home medications.  Hyperlipidemia: Continue statin  Hypertension: We will restart his home medications.  Currently blood pressure stable.  Solitary left pulmonary nodule on  CT: Being followed by pulmonology.  Last CT scan on November 2019 showed 1.3 cm left lower lobe lung nodule which has remained unchanged from 01/2018. He  Is a past smoker.  Morbid obesity: He should focus on healthy diet and exercise after resolution of his back pain.  DVT prophylaxis: ScD Code Status: Full Family Communication: Wife. Disposition Plan: This will depend on hospital course.  Consultants: Neurosurgery, IR Procedures: MRI  Antimicrobials:  Anti-infectives (From admission, onward)   None      Subjective: Pain is controlled. No other constitutional symptoms reported (no fever or chills, no shortness of breath and no chest pain).  Objective: Vitals:   10/23/18 0538 10/23/18 0955 10/23/18 1302 10/23/18 1531  BP: 139/60 134/60 (!) 146/60 124/64  Pulse: 60 75 64 (!) 57  Resp: 18 17  16   Temp: (!) 97.4 F (36.3 C) 98.1 F (36.7 C) 97.8 F (36.6 C) (!) 97.5 F (36.4 C)  TempSrc: Oral Oral Oral Oral  SpO2: 96% 94% 92% 90%  Weight:      Height:        Intake/Output Summary (Last 24 hours) at 10/23/2018 1734 Last data filed at 10/23/2018  1600 Gross per 24 hour  Intake 720 ml  Output -  Net 720 ml   Filed Weights   10/23/18 0525  Weight: 134.7 kg    Examination:  General exam: Morbidly obese.  Not in any painful distress.    HEENT:PERRL,Oral mucosa moist, Ear/Nose normal on gross exam Respiratory system: Clear to auscultation. Cardiovascular system: S1 & S2 heard.   Gastrointestinal system: Abdomen is morbidly obese, soft and nontender.  Organs are difficult to assess.  Central nervous system: Alert and oriented. No focal neurological deficits. Extremities: No edema, no clubbing ,no cyanosis, distal peripheral pulses palpable.  Data Reviewed: I have personally reviewed following labs and imaging studies  CBC: Recent Labs  Lab 10/21/18 1154  WBC 10.7*  NEUTROABS 7.6  HGB 16.4  HCT 50.5  MCV 90.2  PLT 539   Basic Metabolic Panel: Recent Labs  Lab 10/21/18 1154  NA 142  K 3.5  CL 103  CO2 25  GLUCOSE 149*  BUN 14  CREATININE 1.20  CALCIUM 9.4   GFR: Estimated Creatinine Clearance: 80.3 mL/min (by C-G formula based on SCr of 1.2 mg/dL). Liver Function Tests: No results for input(s): AST, ALT, ALKPHOS, BILITOT, PROT, ALBUMIN in the last 168 hours. No results for input(s): LIPASE, AMYLASE in the last 168 hours. No results for input(s): AMMONIA in the last 168 hours. Coagulation Profile: No results for input(s): INR, PROTIME in the last 168 hours. Cardiac Enzymes: No results for input(s): CKTOTAL, CKMB, CKMBINDEX, TROPONINI in the last 168 hours. BNP (last 3 results) Recent Labs    11/19/17 1556  PROBNP 79.0   HbA1C: No results for input(s): HGBA1C in the last 72 hours. CBG: Recent Labs  Lab 10/22/18 0555  GLUCAP 134*   Lipid Profile: No results for input(s): CHOL, HDL, LDLCALC, TRIG, CHOLHDL, LDLDIRECT in the last 72 hours. Thyroid Function Tests: No results for input(s): TSH, T4TOTAL, FREET4, T3FREE, THYROIDAB in the last 72 hours. Anemia Panel: No results for input(s):  VITAMINB12, FOLATE, FERRITIN, TIBC, IRON, RETICCTPCT in the last 72 hours. Sepsis Labs: No results for input(s): PROCALCITON, LATICACIDVEN in the last 168 hours.  No results found for this or any previous visit (from the past 240 hour(s)).       Radiology Studies: Mr Lumbar Spine W Wo Contrast  Result Date: 10/21/2018 CLINICAL DATA:  68 y/o  M; left hip and back pain. EXAM: MRI LUMBAR SPINE WITHOUT AND WITH CONTRAST TECHNIQUE: Multiplanar and multiecho pulse sequences of the lumbar spine were obtained without and with intravenous contrast. CONTRAST:  10 cc Gadavist COMPARISON:  None. FINDINGS: Segmentation:  Standard. Alignment:  Physiologic. Vertebrae: No fracture, evidence of discitis, or bone lesion. No abnormal enhancement. Conus medullaris and cauda equina: Conus extends to the L1-2 level. Fatty filum. Otherwise conus and cauda equina appear normal. No abnormal enhancement. Paraspinal and other soft tissues: Negative. Disc levels: L1-2: No significant disc displacement, foraminal stenosis, or canal stenosis. L2-3: Mild disc bulge and facet hypertrophy. Mild bilateral foraminal stenosis. No canal stenosis. Right lateral and anterior annular fissure. L3-4: Mild disc bulge and facet hypertrophy. 11 mm left foraminal disc protrusion with  mild surrounding edema and enhancement, likely recent (series 7, image 14 and series 8, image 26). Protrusion impinges the exiting left L3 nerve root and results in severe left-sided foraminal stenosis. Mild right foraminal stenosis. No canal stenosis. L4-5: Disc bulge, mild facet hypertrophy, as well as right foraminal and extraforaminal disc protrusion with prominent endplate marginal osteophytes. Mild left and severe right foraminal stenosis. Mild spinal canal stenosis and partial effacement of lateral recesses. L5-S1: Disc bulge with endplate marginal osteophytes eccentric to the left foraminal and extraforaminal zones combined with mild facet hypertrophy. Mild  right and moderate left foraminal stenosis. No spinal canal stenosis. IMPRESSION: 1. No acute osseous abnormality or malalignment. 2. L3-4 left foraminal disc protrusion impinges the exiting left L3 nerve root and results in severe left-sided foraminal stenosis. Surrounding edema indicates the protrusion is likely recent. 3. Lumbar spondylosis with L4-5 severe right foraminal stenosis, L5-S1 moderate left foraminal stenosis, multilevel mild foraminal stenosis. No significant spinal canal stenosis. Electronically Signed   By: Kristine Garbe M.D.   On: 10/21/2018 18:27   Ir Inject Diag/thera/inc Needle/cath/plc Epi/lumb/sac W/img  Result Date: 10/23/2018 CLINICAL DATA:  Left L3-4 foraminal to extraforaminal disc herniation with left L3 radicular symptoms. EXAM: Left L3 nerve root block and trans foraminal epidural COMPARISON:  MRI 10/21/2018 PROCEDURE: The procedure was discussed in depth with the patient. Relevant imaging was reviewed. The patient was placed prone on the fluoroscopic table and a betadine scrub of the low back was performed. The patient was draped in sterile fashion. Skin and subcutaneous anesthesia was carried out using 1% lidocaine. A curved 5 inch 22 gauge spinal needle was directed under fluoroscopic guidance adjacent to the nerve. Isovue 200 was injected for confirmation of needle placement. Contrast outlines the nerve root and shows selective epidural spread. No venous communication. 120 mg of Depo-Medrol mixed with 2 ml of 1% lidocaine were injected. The procedure was well tolerated. The patient was returned to the floor in good condition. No additional postprocedure orders. IMPRESSION: Technically successful left L3 nerve root block and transforaminal epidural Electronically Signed   By: Nelson Chimes M.D.   On: 10/23/2018 15:24   Scheduled Meds: . acetaminophen  650 mg Oral Q6H  . amLODipine  5 mg Oral QHS  . dexamethasone  6 mg Intravenous Q6H  . gabapentin  300 mg Oral TID   . irbesartan  300 mg Oral Daily  . lidocaine (PF)      . metoprolol succinate  25 mg Oral Daily  . pantoprazole  40 mg Oral Daily  . rosuvastatin  10 mg Oral QHS  . sodium chloride (PF)      . sodium chloride flush  3 mL Intravenous Q12H  . tamsulosin  0.4 mg Oral QHS   Continuous Infusions: . sodium chloride       LOS: 1 day    Time spent: 25 mins.   Bonnell Public, MD [facility] Pager [phone] If 7PM-7AM, please contact night-coverage [email or web address] Password [facility] 10/23/2018, 5:34 PM

## 2018-10-24 LAB — GLUCOSE, CAPILLARY
Glucose-Capillary: 226 mg/dL — ABNORMAL HIGH (ref 70–99)
Glucose-Capillary: 193 mg/dL — ABNORMAL HIGH (ref 70–99)
Glucose-Capillary: 201 mg/dL — ABNORMAL HIGH (ref 70–99)
Glucose-Capillary: 206 mg/dL — ABNORMAL HIGH (ref 70–99)

## 2018-10-24 MED ORDER — INSULIN ASPART 100 UNIT/ML ~~LOC~~ SOLN
0.0000 [IU] | Freq: Three times a day (TID) | SUBCUTANEOUS | Status: DC
  Administered 2018-10-24: 5 [IU] via SUBCUTANEOUS

## 2018-10-24 MED ORDER — INSULIN ASPART 100 UNIT/ML ~~LOC~~ SOLN
0.0000 [IU] | Freq: Three times a day (TID) | SUBCUTANEOUS | Status: DC
  Administered 2018-10-25: 5 [IU] via SUBCUTANEOUS
  Administered 2018-10-25: 3 [IU] via SUBCUTANEOUS
  Administered 2018-10-25 – 2018-10-26 (×2): 5 [IU] via SUBCUTANEOUS
  Administered 2018-10-26 – 2018-10-27 (×5): 3 [IU] via SUBCUTANEOUS
  Administered 2018-10-28 – 2018-10-29 (×4): 5 [IU] via SUBCUTANEOUS
  Administered 2018-10-30: 3 [IU] via SUBCUTANEOUS
  Administered 2018-10-31: 2 [IU] via SUBCUTANEOUS

## 2018-10-24 NOTE — Progress Notes (Signed)
PROGRESS NOTE    Alfred Carney  QJJ:[id number] DOB: 05-17-1951 DOA: 10/21/2018 PCP: Lesleigh Noe, MD   Brief Narrative: Patient is a 68 year old male with past medical history of left total hip replacement, COPD, chronic respiratory failure on 2 L of oxygen at home, sleep apnea but not tolerating CPAP, multivessel coronary artery disease who presented to the emergency department with complaints of left-sided hip and back pain.  He was having shooting pain on his left upper thigh.  MRI of the lumbar spine showed L3-L4 left foraminal disc protrusion impinging left L3 nerve root, left-sided foraminal stenosis, surrounding edema, lumbar spondylosis with L4-L5 severe right foraminal stenosis.  Orthopedics and neurosurgery consulted.  Planning for conservative management but also possibility of ESI by IR.  PT evaluated him and recommended skilled nursing facility.  10/23/2018: Patient underwent blocking of the left L3 nerve root earlier today.  Patient had discussed with the neurosurgeon that he would prefer the disc problem and radiculopathy managed medically.  Patient seems to be having change of mind.  Patient intends to discuss operative options further with the neurosurgical team.  Apparently, patient's 2 siblings have had back surgery previously.  No pain reported at the moment.  10/24/2018: Patient seen alongside patient's wife and daughter.  Pain is improving.  Neurosurgery input is appreciated.  Continue to monitor patient for now.  Likely DC in the morning as per neurosurgical team.  Patient will follow with neurosurgery on discharge.  Further discharge home once okay with neurosurgery team.  Assessment & Plan:   Principal Problem:   Left hip pain Active Problems:   Hyperlipidemia   Essential hypertension   COPD GOLD II   Morbid obesity due to excess calories (HCC)   Chronic respiratory failure with hypoxia (HCC)   Iron deficiency anemia due to chronic blood loss   Solitary pulmonary nodule  on lung CT   Back pain   Left hip pain: Secondary to referred pain from lumbar spine nerve impingement.  MRI findings as above.  Continue pain management.  Started on gabapentin as per neurosurgery.  Also started on Decadron.  Plan to taper Decadron in few days and stop.  Physical therapy evaluated him and recommended skilled nursing facility on discharge. Neurosurgery not planning for neurosurgical intervention but recommending ESI by IR.  IR consulted. 10/23/2018: Patient underwent blocking of the left L3 nerve root earlier today.  Patient had discussed with the neurosurgeon that he would prefer the disc problem and radiculopathy managed medically.  Patient seems to be having change of mind.  Patient intends to discuss operative options further with the neurosurgical team.  Apparently, patient's 2 siblings have had back surgery previously.  No pain reported at the moment. 10/24/2018: Patient seen alongside patient's wife and daughter.  Pain is improving.  Neurosurgery input is appreciated.  Continue to monitor patient for now.  Likely DC in the morning as per neurosurgical team.  Patient will follow with neurosurgery on discharge.  Further discharge home once okay with neurosurgery team.   COPD: Currently stable.  He follows with pulmonology, Dr. Melvyn Novas.  On oxygen at 2 L/min.  Continue his home medications.  Hyperlipidemia: Continue statin  Hypertension: We will restart his home medications.  Currently blood pressure stable.  Solitary left pulmonary nodule on  CT: Being followed by pulmonology.  Last CT scan on November 2019 showed 1.3 cm left lower lobe lung nodule which has remained unchanged from 01/2018. He  Is a past smoker.  Morbid obesity: He should  focus on healthy diet and exercise after resolution of his back pain.  DVT prophylaxis: ScD Code Status: Full Family Communication: Wife. Disposition Plan: This will depend on hospital course.  Consultants: Neurosurgery, IR Procedures:  MRI  Antimicrobials:  Anti-infectives (From admission, onward)   None      Subjective: Pain control has improved.  Objective: Vitals:   10/24/18 0237 10/24/18 0512 10/24/18 1027 10/24/18 1437  BP: 123/64 (!) 144/60  (!) 131/56  Pulse: 60 (!) 54 70 (!) 57  Resp: 18 18  16   Temp: 97.6 F (36.4 C) (!) 97.5 F (36.4 C)  98.6 F (37 C)  TempSrc: Oral Oral  Oral  SpO2: 96% 92%  92%  Weight:      Height:        Intake/Output Summary (Last 24 hours) at 10/24/2018 1521 Last data filed at 10/24/2018 0900 Gross per 24 hour  Intake 360 ml  Output -  Net 360 ml   Filed Weights   10/23/18 0525  Weight: 134.7 kg    Examination:  General exam: Morbidly obese.  Not in any painful distress.    HEENT:PERRL,Oral mucosa moist, Ear/Nose normal on gross exam Respiratory system: Clear to auscultation. Cardiovascular system: S1 & S2 heard.   Gastrointestinal system: Abdomen is morbidly obese, soft and nontender.  Organs are difficult to assess.  Central nervous system: Alert and oriented. No focal neurological deficits. Extremities: No edema, no clubbing ,no cyanosis, distal peripheral pulses palpable.  Data Reviewed: I have personally reviewed following labs and imaging studies  CBC: Recent Labs  Lab 10/21/18 1154  WBC 10.7*  NEUTROABS 7.6  HGB 16.4  HCT 50.5  MCV 90.2  PLT 944   Basic Metabolic Panel: Recent Labs  Lab 10/21/18 1154  NA 142  K 3.5  CL 103  CO2 25  GLUCOSE 149*  BUN 14  CREATININE 1.20  CALCIUM 9.4   GFR: Estimated Creatinine Clearance: 80.3 mL/min (by C-G formula based on SCr of 1.2 mg/dL). Liver Function Tests: No results for input(s): AST, ALT, ALKPHOS, BILITOT, PROT, ALBUMIN in the last 168 hours. No results for input(s): LIPASE, AMYLASE in the last 168 hours. No results for input(s): AMMONIA in the last 168 hours. Coagulation Profile: No results for input(s): INR, PROTIME in the last 168 hours. Cardiac Enzymes: No results for input(s):  CKTOTAL, CKMB, CKMBINDEX, TROPONINI in the last 168 hours. BNP (last 3 results) Recent Labs    11/19/17 1556  PROBNP 79.0   HbA1C: No results for input(s): HGBA1C in the last 72 hours. CBG: Recent Labs  Lab 10/22/18 0555 10/24/18 0625 10/24/18 1126  GLUCAP 134* 226* 193*   Lipid Profile: No results for input(s): CHOL, HDL, LDLCALC, TRIG, CHOLHDL, LDLDIRECT in the last 72 hours. Thyroid Function Tests: No results for input(s): TSH, T4TOTAL, FREET4, T3FREE, THYROIDAB in the last 72 hours. Anemia Panel: No results for input(s): VITAMINB12, FOLATE, FERRITIN, TIBC, IRON, RETICCTPCT in the last 72 hours. Sepsis Labs: No results for input(s): PROCALCITON, LATICACIDVEN in the last 168 hours.  No results found for this or any previous visit (from the past 240 hour(s)).       Radiology Studies: Ir Inject Diag/thera/inc Needle/cath/plc Epi/lumb/sac W/img  Result Date: 10/23/2018 CLINICAL DATA:  Left L3-4 foraminal to extraforaminal disc herniation with left L3 radicular symptoms. EXAM: Left L3 nerve root block and trans foraminal epidural COMPARISON:  MRI 10/21/2018 PROCEDURE: The procedure was discussed in depth with the patient. Relevant imaging was reviewed. The patient was placed prone  on the fluoroscopic table and a betadine scrub of the low back was performed. The patient was draped in sterile fashion. Skin and subcutaneous anesthesia was carried out using 1% lidocaine. A curved 5 inch 22 gauge spinal needle was directed under fluoroscopic guidance adjacent to the nerve. Isovue 200 was injected for confirmation of needle placement. Contrast outlines the nerve root and shows selective epidural spread. No venous communication. 120 mg of Depo-Medrol mixed with 2 ml of 1% lidocaine were injected. The procedure was well tolerated. The patient was returned to the floor in good condition. No additional postprocedure orders. IMPRESSION: Technically successful left L3 nerve root block and  transforaminal epidural Electronically Signed   By: Nelson Chimes M.D.   On: 10/23/2018 15:24   Scheduled Meds: . acetaminophen  650 mg Oral Q6H  . amLODipine  5 mg Oral QHS  . dexamethasone  6 mg Intravenous Q6H  . gabapentin  300 mg Oral TID  . insulin aspart  0-20 Units Subcutaneous TID WC  . irbesartan  300 mg Oral Daily  . metoprolol succinate  25 mg Oral Daily  . pantoprazole  40 mg Oral Daily  . rosuvastatin  10 mg Oral QHS  . sodium chloride flush  3 mL Intravenous Q12H  . tamsulosin  0.4 mg Oral QHS   Continuous Infusions: . sodium chloride       LOS: 2 days    Time spent: 25 mins.   Bonnell Public, MD [facility] Pager [phone] If 7PM-7AM, please contact night-coverage [email or web address] Password [email or web address] 10/24/2018, 3:21 PM

## 2018-10-24 NOTE — Progress Notes (Signed)
  NEUROSURGERY PROGRESS NOTE   No issues overnight.  Underwent left L3 transforaminal esi yesterday. Reports 50-60% improvement in pain. Feels much better ambulating. Denies weakness in BLE  EXAM:  BP (!) 144/60 (BP Location: Right Arm)   Pulse (!) 54   Temp (!) 97.5 F (36.4 C) (Oral)   Resp 18   Ht 5\' 9"  (1.753 m)   Wt 134.7 kg   SpO2 92%   BMI 43.84 kg/m   Awake, alert, oriented  Speech fluent, appropriate  CN grossly intact  5/5 BUE/BLE   PLAN Stable neurologically. Pain 50-60% improved with ESI yesterday. This is very reassuring. I advised him that sometimes the injection can take several days before we see max benefit, however, he is now wanting to discuss possible surgical intervention due to lingering pain. This will ultimately need to be discussed with Dr Sherwood Gambler. From our perspective, patient could be discharged with outpt follow up due to improvement. He is hesitant to be d/c and would like to reassess tomorrow am. Will continue to follow.

## 2018-10-24 NOTE — Progress Notes (Addendum)
Occupational Therapy Evaluation Patient Details Name: Alfred Carney MRN: [id number] DOB: 1950-10-08 Today's Date: 10/24/2018    History of Present Illness Mr. Stansbury is a 68 yo male who presented to the ED with approximately 1 week history of pain which originates over his left buttock area and lower back. Significant PMH of L THA in 2014, chronic respiratory failure, morbid obesity, type 2 diabetes.   Clinical Impression   PTA pt required assistance with some ADLs including LB dressing (donning socks). Pt receives assistance from wife but typically requires increased time to complete tasks. Pt currently Supervison in LB ADLs, VCs for safety, precaution, and body mechanics. Min guard functional transfers for safety with VCs for sequencing and safety. Pt and family educated back precautions with completing ADLs as well as body mechanics to decreases pain, handout provded. Pt receptive of information but observed to be really impulsive during tasks required Max VCs to adhere to precautions. Pt will not need further OT after DC. OT will continue to follow acutely to ensure patient is safe to return to home setting.    Follow Up Recommendations  No OT follow up;Supervision - Intermittent    Equipment Recommendations       Recommendations for Other Services       Precautions / Restrictions Precautions Precautions: Fall Precaution Comments: Teaching back precautions due to pain with disc herniation. Handout provided, pt wife and daughter present during session. Restrictions Weight Bearing Restrictions: No      Mobility Bed Mobility Overal bed mobility: Needs Assistance Bed Mobility: Sidelying to Sit;Sit to Supine;Rolling Rolling: Supervision Sidelying to sit: Supervision   Sit to supine: Modified independent (Device/Increase time)   General bed mobility comments: cues for spinal precautions and assist to lift trunk from R sidelying; sit to supine unaided  Transfers Overall transfer  level: Needs assistance Equipment used: Rolling walker (2 wheeled) Transfers: Sit to/from Stand Sit to Stand: Min guard         General transfer comment: Pt observed to tolerate transition with no signs of dizziness or pain.     Balance Overall balance assessment: Mild deficits observed, not formally tested                                         ADL either performed or assessed with clinical judgement   ADL Overall ADL's : Needs assistance/impaired             Lower Body Bathing: Supervison/ safety;Cueing for safety;Cueing for sequencing       Lower Body Dressing: Supervision/safety;Cueing for safety;Cueing for sequencing;Sit to/from stand   Toilet Transfer: Min guard;Cueing for safety;Cueing for sequencing;Ambulation;RW;Grab bars           Functional mobility during ADLs: Min guard General ADL Comments: Pt really impulsive and not mindful of safety awareness when completing ADLs required Max VCs for reminders to adhere to precautions and body mechanics.     Vision         Perception     Praxis      Pertinent Vitals/Pain Pain Assessment: Faces Faces Pain Scale: Hurts a little bit Pain Location: L hip posterior thigh Pain Descriptors / Indicators: Aching;Sharp Pain Intervention(s): Monitored during session;Repositioned     Hand Dominance Right   Extremity/Trunk Assessment Upper Extremity Assessment Upper Extremity Assessment: Overall WFL for tasks assessed   Lower Extremity Assessment Lower Extremity Assessment: Defer to PT evaluation  Cervical / Trunk Assessment Cervical / Trunk Assessment: Normal   Communication Communication Communication: No difficulties   Cognition Arousal/Alertness: Awake/alert Behavior During Therapy: WFL for tasks assessed/performed Overall Cognitive Status: Within Functional Limits for tasks assessed                                     General Comments  wife and daughter present  during session. Pt educated of body mechanics and safety awareness with handout provided for transfers and ADL engagment for home setting.    Exercises     Shoulder Instructions      Home Living Family/patient expects to be discharged to:: Private residence Living Arrangements: Spouse/significant other Available Help at Discharge: Family;Available 24 hours/day Type of Home: House Home Access: Stairs to enter [employer] of Steps: 3 Entrance Stairs-Rails: Can reach both Home Layout: One level     Bathroom Shower/Tub: Occupational psychologist: Handicapped height Bathroom Accessibility: Yes   Home Equipment: Environmental consultant - 2 wheels;Cane - single point(3 n1 )   Additional Comments: Pt reports having AE from previous surgeries.      Prior Functioning/Environment Level of Independence: Needs assistance  Gait / Transfers Assistance Needed: Independent prior to pain flare up, after pain flare up- totalA for transfers ADL's / Homemaking Assistance Needed: Wife assisting with sock donning            OT Problem List: Pain;Decreased safety awareness;Decreased knowledge of precautions      OT Treatment/Interventions:      OT Goals(Current goals can be found in the care plan section) Acute Rehab OT Goals Patient Stated Goal: stop hurting OT Goal Formulation: With patient/family Time For Goal Achievement: 11/07/18 Potential to Achieve Goals: Good ADL Goals Pt Will Perform Lower Body Bathing: with modified independence;sit to/from stand Pt Will Perform Lower Body Dressing: with modified independence;sit to/from stand Pt Will Transfer to Toilet: with modified independence;ambulating;bedside commode Pt Will Perform Tub/Shower Transfer: 3 in 1;Tub transfer;ambulating  OT Frequency:     Barriers to D/C:            Co-evaluation              AM-PAC OT "6 Clicks" Daily Activity     Outcome Measure Help from another person eating meals?: None Help from  another person taking care of personal grooming?: None Help from another person toileting, which includes using toliet, bedpan, or urinal?: A Little(cueing for safety and body mechanics) Help from another person bathing (including washing, rinsing, drying)?: A Little Help from another person to put on and taking off regular upper body clothing?: None Help from another person to put on and taking off regular lower body clothing?: A Little 6 Click Score: 21   End of Session Equipment Utilized During Treatment: Gait belt;Rolling walker Nurse Communication: Precautions  Activity Tolerance: Patient tolerated treatment well Patient left: in bed;with call bell/phone within reach;with family/visitor present  OT Visit Diagnosis: Unsteadiness on feet (R26.81);Pain Pain - Right/Left: Left Pain - part of body: Hip(lower back)                Time: 1610-9604 OT Time Calculation (min): 28 min Charges:  OT General Charges $OT Visit: 1 Visit OT Evaluation $OT Eval Moderate Complexity: 1 Mod OT Treatments $Self Care/Home Management : 8-22 mins  Minus Breeding, MSOT, OTR/L  [facility] Rehabilitation [facility]  [phone]  Marius Ditch 10/24/2018, 12:04 PM

## 2018-10-25 LAB — GLUCOSE, CAPILLARY
Glucose-Capillary: 193 mg/dL — ABNORMAL HIGH (ref 70–99)
Glucose-Capillary: 224 mg/dL — ABNORMAL HIGH (ref 70–99)
Glucose-Capillary: 241 mg/dL — ABNORMAL HIGH (ref 70–99)
Glucose-Capillary: 284 mg/dL — ABNORMAL HIGH (ref 70–99)

## 2018-10-25 NOTE — Progress Notes (Signed)
PROGRESS NOTE    Alfred Carney  HER:[id number] DOB: [birth year] DOA: 10/21/2018 PCP: Lesleigh Noe, MD   Brief Narrative: Patient is a 68 year old male with past medical history of left total hip replacement, COPD, chronic respiratory failure on 2 L of oxygen at home, sleep apnea but not tolerating CPAP, multivessel coronary artery disease who presented to the emergency department with complaints of left-sided hip and back pain.  He was having shooting pain on his left upper thigh.  MRI of the lumbar spine showed L3-L4 left foraminal disc protrusion impinging left L3 nerve root, left-sided foraminal stenosis, surrounding edema, lumbar spondylosis with L4-L5 severe right foraminal stenosis.  Orthopedics and neurosurgery consulted.  Planning for conservative management but also possibility of ESI by IR.  PT evaluated him and recommended skilled nursing facility.  10/23/2018: Patient underwent blocking of the left L3 nerve root earlier today.  Patient had discussed with the neurosurgeon that he would prefer the disc problem and radiculopathy managed medically.  Patient seems to be having change of mind.  Patient intends to discuss operative options further with the neurosurgical team.  Apparently, patient's 2 siblings have had back surgery previously.  No pain reported at the moment.  10/24/2018: Patient seen alongside patient's wife and daughter.  Pain is improving.  Neurosurgery input is appreciated.  Continue to monitor patient for now.  Likely DC in the morning as per neurosurgical team.  Patient will follow with neurosurgery on discharge.  Further discharge home once okay with neurosurgery team.  10/25/2018: Patient is not keen on being discharged back on.  Patient wants to discuss further with the neuro-surgical team in the morning.  Patient reports worsening back pain.  Assessment & Plan:   Principal Problem:   Left hip pain Active Problems:   Hyperlipidemia   Essential hypertension   COPD GOLD  II   Morbid obesity due to excess calories (HCC)   Chronic respiratory failure with hypoxia (HCC)   Iron deficiency anemia due to chronic blood loss   Solitary pulmonary nodule on lung CT   Back pain   Left hip pain: Secondary to referred pain from lumbar spine nerve impingement.  MRI findings as above.  Continue pain management.  Started on gabapentin as per neurosurgery.  Also started on Decadron.  Plan to taper Decadron in few days and stop.  Physical therapy evaluated him and recommended skilled nursing facility on discharge. Neurosurgery not planning for neurosurgical intervention but recommending ESI by IR.  IR consulted. 10/23/2018: Patient underwent blocking of the left L3 nerve root earlier today.  Patient had discussed with the neurosurgeon that he would prefer the disc problem and radiculopathy managed medically.  Patient seems to be having change of mind.  Patient intends to discuss operative options further with the neurosurgical team.  Apparently, patient's 2 siblings have had back surgery previously.  No pain reported at the moment. 10/24/2018: Patient seen alongside patient's wife and daughter.  Pain is improving.  Neurosurgery input is appreciated.  Continue to monitor patient for now.  Likely DC in the morning as per neurosurgical team.  Patient will follow with neurosurgery on discharge.  Further discharge home once okay with neurosurgery team. 10/25/2018: Patient is not keen on being discharged back on.  Patient wants to discuss further with the neuro-surgical team in the morning.  Patient reports worsening back pain.  COPD: Currently stable.  He follows with pulmonology, Dr. Melvyn Novas.  On oxygen at 2 L/min.  Continue his home medications.  Hyperlipidemia: Continue  statin  Hypertension: We will restart his home medications.  Currently blood pressure stable.  Solitary left pulmonary nodule on  CT: Being followed by pulmonology.  Last CT scan on November 2019 showed 1.3 cm left lower lobe  lung nodule which has remained unchanged from 01/2018. He  Is a past smoker.  Morbid obesity: He should focus on healthy diet and exercise after resolution of his back pain.  DVT prophylaxis: ScD Code Status: Full Family Communication: Wife. Disposition Plan: This will depend on hospital course.  Consultants: Neurosurgery, IR Procedures: MRI  Antimicrobials:  Anti-infectives (From admission, onward)   None      Subjective: Reports worsening back pain.    Objective: Vitals:   10/24/18 1437 10/24/18 1941 10/25/18 0523 10/25/18 1438  BP: (!) 131/56 (!) 145/44 (!) 147/64 (!) 118/57  Pulse: (!) 57 63 (!) 58 (!) 55  Resp: 16 18 18    Temp: 98.6 F (37 C) 97.7 F (36.5 C) 98.2 F (36.8 C) 98.1 F (36.7 C)  TempSrc: Oral Oral Oral Oral  SpO2: 92% 97%  95%  Weight:      Height:        Intake/Output Summary (Last 24 hours) at 10/25/2018 1626 Last data filed at 10/25/2018 1400 Gross per 24 hour  Intake 540 ml  Output -  Net 540 ml   Filed Weights   10/23/18 0525  Weight: 134.7 kg    Examination:  General exam: Morbidly obese.  Not in any painful distress.    HEENT:PERRL,Oral mucosa moist, Ear/Nose normal on gross exam Respiratory system: Clear to auscultation. Cardiovascular system: S1 & S2 heard.   Gastrointestinal system: Abdomen is morbidly obese, soft and nontender.  Organs are difficult to assess.  Central nervous system: Alert and oriented. No focal neurological deficits. Extremities: No edema, no clubbing ,no cyanosis, distal peripheral pulses palpable.  Data Reviewed: I have personally reviewed following labs and imaging studies  CBC: Recent Labs  Lab 10/21/18 1154  WBC 10.7*  NEUTROABS 7.6  HGB 16.4  HCT 50.5  MCV 90.2  PLT 791   Basic Metabolic Panel: Recent Labs  Lab 10/21/18 1154  NA 142  K 3.5  CL 103  CO2 25  GLUCOSE 149*  BUN 14  CREATININE 1.20  CALCIUM 9.4   GFR: Estimated Creatinine Clearance: 80.3 mL/min (by C-G formula based on  SCr of 1.2 mg/dL). Liver Function Tests: No results for input(s): AST, ALT, ALKPHOS, BILITOT, PROT, ALBUMIN in the last 168 hours. No results for input(s): LIPASE, AMYLASE in the last 168 hours. No results for input(s): AMMONIA in the last 168 hours. Coagulation Profile: No results for input(s): INR, PROTIME in the last 168 hours. Cardiac Enzymes: No results for input(s): CKTOTAL, CKMB, CKMBINDEX, TROPONINI in the last 168 hours. BNP (last 3 results) Recent Labs    11/19/17 1556  PROBNP 79.0   HbA1C: No results for input(s): HGBA1C in the last 72 hours. CBG: Recent Labs  Lab 10/24/18 1126 10/24/18 1627 10/24/18 2156 10/25/18 0359 10/25/18 1136  GLUCAP 193* 201* 206* 193* 224*   Lipid Profile: No results for input(s): CHOL, HDL, LDLCALC, TRIG, CHOLHDL, LDLDIRECT in the last 72 hours. Thyroid Function Tests: No results for input(s): TSH, T4TOTAL, FREET4, T3FREE, THYROIDAB in the last 72 hours. Anemia Panel: No results for input(s): VITAMINB12, FOLATE, FERRITIN, TIBC, IRON, RETICCTPCT in the last 72 hours. Sepsis Labs: No results for input(s): PROCALCITON, LATICACIDVEN in the last 168 hours.  No results found for this or any previous visit (  from the past 240 hour(s)).       Radiology Studies: No results found. Scheduled Meds: . acetaminophen  650 mg Oral Q6H  . amLODipine  5 mg Oral QHS  . dexamethasone  6 mg Intravenous Q6H  . gabapentin  300 mg Oral TID  . insulin aspart  0-15 Units Subcutaneous TID WC  . irbesartan  300 mg Oral Daily  . metoprolol succinate  25 mg Oral Daily  . pantoprazole  40 mg Oral Daily  . rosuvastatin  10 mg Oral QHS  . sodium chloride flush  3 mL Intravenous Q12H  . tamsulosin  0.4 mg Oral QHS   Continuous Infusions: . sodium chloride       LOS: 3 days    Time spent: 25 mins.   Bonnell Public, MD [facility] Pager [phone] If 7PM-7AM, please contact night-coverage [email or web address] Password  [facility] 10/25/2018, 4:26  PM

## 2018-10-25 NOTE — Plan of Care (Signed)

## 2018-10-25 NOTE — Progress Notes (Signed)
  NEUROSURGERY PROGRESS NOTE   No issues overnight.  Feels as though ESI is wearing off and pain is almost as severe as it was prior Would ultimately like to discuss surgery with Dr Sherwood Gambler if offered Denies bowel/bladder dysfunction  EXAM:  BP (!) 147/64 (BP Location: Left Arm)   Pulse (!) 58   Temp 98.2 F (36.8 C) (Oral)   Resp 18   Ht 5\' 9"  (1.753 m)   Wt 134.7 kg   SpO2 97%   BMI 43.84 kg/m   Awake, alert, oriented  Speech fluent, appropriate  CN grossly intact  5/5 BUE/BLE, mild pain mediated weakness left HF  PLAN Stable neurologically, although pain in LLE returning. I believe patient is technically stable enough for discharge, but he would prefer to discuss with Dr Sherwood Gambler tomorrow regarding increasing pain. I advised him that although he is in pain, his current situation does not require emergent surgery and it will likely not be done this upcoming week. He states understanding but still insists on talking to Dr Sherwood Gambler tomorrow.

## 2018-10-26 ENCOUNTER — Other Ambulatory Visit: Payer: Self-pay | Admitting: Neurosurgery

## 2018-10-26 DIAGNOSIS — J9611 Chronic respiratory failure with hypoxia: Secondary | ICD-10-CM

## 2018-10-26 DIAGNOSIS — I2510 Atherosclerotic heart disease of native coronary artery without angina pectoris: Secondary | ICD-10-CM

## 2018-10-26 DIAGNOSIS — I10 Essential (primary) hypertension: Secondary | ICD-10-CM

## 2018-10-26 DIAGNOSIS — M25552 Pain in left hip: Secondary | ICD-10-CM

## 2018-10-26 DIAGNOSIS — J449 Chronic obstructive pulmonary disease, unspecified: Secondary | ICD-10-CM

## 2018-10-26 DIAGNOSIS — E7800 Pure hypercholesterolemia, unspecified: Secondary | ICD-10-CM

## 2018-10-26 DIAGNOSIS — G4733 Obstructive sleep apnea (adult) (pediatric): Secondary | ICD-10-CM

## 2018-10-26 LAB — GLUCOSE, CAPILLARY
Glucose-Capillary: 210 mg/dL — ABNORMAL HIGH (ref 70–99)
Glucose-Capillary: 200 mg/dL — ABNORMAL HIGH (ref 70–99)
Glucose-Capillary: 212 mg/dL — ABNORMAL HIGH (ref 70–99)

## 2018-10-26 MED ORDER — DEXAMETHASONE SODIUM PHOSPHATE 4 MG/ML IJ SOLN
4.0000 mg | Freq: Four times a day (QID) | INTRAMUSCULAR | Status: DC
  Administered 2018-10-26 – 2018-10-27 (×4): 4 mg via INTRAVENOUS
  Filled 2018-10-26 (×4): qty 1

## 2018-10-26 NOTE — Progress Notes (Signed)
PROGRESS NOTE    Alfred Carney  HYQ:[id number] DOB: 1950-11-23 DOA: 10/21/2018 PCP: Lesleigh Noe, MD   Brief Narrative: Patient is a 68 year old male with past medical history of left total hip replacement, COPD, chronic respiratory failure on 2 L of oxygen at home, sleep apnea but not tolerating CPAP, multivessel coronary artery disease who presented to the emergency department with complaints of left-sided hip and back pain.  He was having shooting pain on his left upper thigh.  MRI of the lumbar spine showed L3-L4 left foraminal disc protrusion impinging left L3 nerve root, left-sided foraminal stenosis, surrounding edema, lumbar spondylosis with L4-L5 severe right foraminal stenosis.  Orthopedics and neurosurgery consulted.  Planning for conservative management but also possibility of ESI by IR.  PT evaluated him and recommended skilled nursing facility.  10/23/2018: Patient underwent blocking of the left L3 nerve root earlier today.  Patient had discussed with the neurosurgeon that he would prefer the disc problem and radiculopathy managed medically.  Patient seems to be having change of mind.  Patient intends to discuss operative options further with the neurosurgical team.  Apparently, patient's 2 siblings have had back surgery previously.  No pain reported at the moment.  10/24/2018: Patient seen alongside patient's wife and daughter.  Pain is improving.  Neurosurgery input is appreciated.  Continue to monitor patient for now.  Likely DC in the morning as per neurosurgical team.  Patient will follow with neurosurgery on discharge.  Further discharge home once okay with neurosurgery team.  10/25/2018: Patient is not keen on being discharged back on.  Patient wants to discuss further with the neuro-surgical team in the morning.  Patient reports worsening back pain.  10/26/2018: Patient seen.  Discussed with the neurosurgeon, Dr. Sherwood Gambler.  Likely surgery in 2 days.  Consulted pulmonary and cardiology  for optimization.  Patient continues to report pain.  Assessment & Plan:   Principal Problem:   Left hip pain Active Problems:   Hyperlipidemia   Essential hypertension   COPD GOLD II   Morbid obesity due to excess calories (HCC)   Chronic respiratory failure with hypoxia (HCC)   Iron deficiency anemia due to chronic blood loss   Solitary pulmonary nodule on lung CT   Back pain   Left hip pain: Secondary to referred pain from lumbar spine nerve impingement.  MRI findings as above.  Continue pain management.  Started on gabapentin as per neurosurgery.  Also started on Decadron.  Plan to taper Decadron in few days and stop.  Physical therapy evaluated him and recommended skilled nursing facility on discharge. Neurosurgery not planning for neurosurgical intervention but recommending ESI by IR.  IR consulted. 10/23/2018: Patient underwent blocking of the left L3 nerve root earlier today.  Patient had discussed with the neurosurgeon that he would prefer the disc problem and radiculopathy managed medically.  Patient seems to be having change of mind.  Patient intends to discuss operative options further with the neurosurgical team.  Apparently, patient's 2 siblings have had back surgery previously.  No pain reported at the moment. 10/24/2018: Patient seen alongside patient's wife and daughter.  Pain is improving.  Neurosurgery input is appreciated.  Continue to monitor patient for now.  Likely DC in the morning as per neurosurgical team.  Patient will follow with neurosurgery on discharge.  Further discharge home once okay with neurosurgery team. 10/25/2018: Patient is not keen on being discharged back on.  Patient wants to discuss further with the neuro-surgical team in the morning.  Patient reports worsening back pain. 10/26/2018: Patient seen.  Discussed with the neurosurgeon, Dr. Sherwood Gambler.  Likely surgery in 2 days.  Consulted pulmonary and cardiology for optimization.  Patient continues to report  pain.  COPD: Currently stable.  He follows with pulmonology, Dr. Melvyn Novas.  On oxygen at 2 L/min.  Continue his home medications.  Hyperlipidemia: Continue statin  Hypertension: We will restart his home medications.  Currently blood pressure stable.  Solitary left pulmonary nodule on  CT: Being followed by pulmonology.  Last CT scan on November 2019 showed 1.3 cm left lower lobe lung nodule which has remained unchanged from 01/2018. He  Is a past smoker.  Morbid obesity: He should focus on healthy diet and exercise after resolution of his back pain.  DVT prophylaxis: ScD Code Status: Full Family Communication: Wife. Disposition Plan: This will depend on hospital course.  Consultants: Neurosurgery, IR Procedures: MRI  Antimicrobials:  Anti-infectives (From admission, onward)   None      Subjective: Reports worsening back pain.    Objective: Vitals:   10/24/18 1941 10/25/18 0523 10/25/18 1438 10/25/18 2155  BP: (!) 145/44 (!) 147/64 (!) 118/57 (!) 157/60  Pulse: 63 (!) 58 (!) 55 (!) 56  Resp: 18 18  14   Temp: 97.7 F (36.5 C) 98.2 F (36.8 C) 98.1 F (36.7 C) (!) 97.4 F (36.3 C)  TempSrc: Oral Oral Oral Oral  SpO2: 97%  95% 96%  Weight:      Height:        Intake/Output Summary (Last 24 hours) at 10/26/2018 0916 Last data filed at 10/25/2018 1700 Gross per 24 hour  Intake 600 ml  Output -  Net 600 ml   Filed Weights   10/23/18 0525  Weight: 134.7 kg    Examination:  General exam: Morbidly obese.  Not in any painful distress.    HEENT:PERRL,Oral mucosa moist, Ear/Nose normal on gross exam Respiratory system: Clear to auscultation. Cardiovascular system: S1 & S2 heard.   Gastrointestinal system: Abdomen is morbidly obese, soft and nontender.  Organs are difficult to assess.  Central nervous system: Alert and oriented. No focal neurological deficits. Extremities: No edema, no clubbing ,no cyanosis, distal peripheral pulses palpable.  Data Reviewed: I have  personally reviewed following labs and imaging studies  CBC: Recent Labs  Lab 10/21/18 1154  WBC 10.7*  NEUTROABS 7.6  HGB 16.4  HCT 50.5  MCV 90.2  PLT 888   Basic Metabolic Panel: Recent Labs  Lab 10/21/18 1154  NA 142  K 3.5  CL 103  CO2 25  GLUCOSE 149*  BUN 14  CREATININE 1.20  CALCIUM 9.4   GFR: Estimated Creatinine Clearance: 80.3 mL/min (by C-G formula based on SCr of 1.2 mg/dL). Liver Function Tests: No results for input(s): AST, ALT, ALKPHOS, BILITOT, PROT, ALBUMIN in the last 168 hours. No results for input(s): LIPASE, AMYLASE in the last 168 hours. No results for input(s): AMMONIA in the last 168 hours. Coagulation Profile: No results for input(s): INR, PROTIME in the last 168 hours. Cardiac Enzymes: No results for input(s): CKTOTAL, CKMB, CKMBINDEX, TROPONINI in the last 168 hours. BNP (last 3 results) Recent Labs    11/19/17 1556  PROBNP 79.0   HbA1C: No results for input(s): HGBA1C in the last 72 hours. CBG: Recent Labs  Lab 10/25/18 0359 10/25/18 1136 10/25/18 1647 10/25/18 1926 10/26/18 0756  GLUCAP 193* 224* 241* 284* 210*   Lipid Profile: No results for input(s): CHOL, HDL, LDLCALC, TRIG, CHOLHDL, LDLDIRECT in the  last 72 hours. Thyroid Function Tests: No results for input(s): TSH, T4TOTAL, FREET4, T3FREE, THYROIDAB in the last 72 hours. Anemia Panel: No results for input(s): VITAMINB12, FOLATE, FERRITIN, TIBC, IRON, RETICCTPCT in the last 72 hours. Sepsis Labs: No results for input(s): PROCALCITON, LATICACIDVEN in the last 168 hours.  No results found for this or any previous visit (from the past 240 hour(s)).       Radiology Studies: No results found. Scheduled Meds: . acetaminophen  650 mg Oral Q6H  . amLODipine  5 mg Oral QHS  . dexamethasone  4 mg Intravenous Q6H  . gabapentin  300 mg Oral TID  . insulin aspart  0-15 Units Subcutaneous TID WC  . irbesartan  300 mg Oral Daily  . metoprolol succinate  25 mg Oral Daily   . pantoprazole  40 mg Oral Daily  . rosuvastatin  10 mg Oral QHS  . sodium chloride flush  3 mL Intravenous Q12H  . tamsulosin  0.4 mg Oral QHS   Continuous Infusions: . sodium chloride       LOS: 4 days    Time spent: 25 mins.   Bonnell Public, MD [facility] Pager [phone] If 7PM-7AM, please contact night-coverage [email or web address] Password [facility] 10/26/2018, 9:16 AM

## 2018-10-26 NOTE — Consult Note (Signed)
Cardiology Consultation:   Patient ID: Alfred Carney MRN: [id number]; DOB: 1951/05/14  Admit date: 10/21/2018 Date of Consult: 10/26/2018  Primary Care Provider: Lesleigh Noe, MD Primary Cardiologist: Peter Martinique, MD  Primary Electrophysiologist:  None    Patient Profile:   Alfred Carney is a 68 y.o. male with a hx of morbid obesity, diabetes, COPD with prior long history of tobacco use and chronic oxygen therapy, OSA and OHS with noncompliance to CPAP and moderate nonobstructive CAD (last cath 2015), PVD s/p repair of thoraco-abdominal aneurysm with right femoral and left iliac bypass grafting and reimplantation of his renal arteries, hypertension, hyperlipidemia who is being seen today for preoperative evaluation at the request of Dr Marthenia Rolling.  History of Present Illness:   Alfred Carney presented to the emergency department with complaints of left-sided hip and back pain.  MRI was done showing lumbar spine disc protrusion and impingement.  Orthopedics and neurosurgery were consulted.  His pain was refractory to conservative treatment.  Neurosurgery is contemplating a left L3-4 extraforaminal microdiscectomy.  Neurosurgery has discussed the risks of this procedure considering his significant medical issues and by review of the notes the patient feels that he cannot continue to live with and be limited by the disabling, bed rating pain that he is currently experiencing.  Alfred Carney was previously followed by Dr. Martinique.  He was last seen in the office on 01/28/2014 by Richardson Dopp, PA at which time he was having complaints of chest pain with both typical and atypical features.  He was referred for cardiac catheterization which was done on 01/31/2014 and revealed proximal LAD with 40% stenosis, mid LAD with diffuse 30% stenosis, circumflex without obstructive disease, second OM branch with 40-50% stenosis, RCA with 40% proximal and 60% mid stenosis, EF 55-60%.  Upon my assessment the patient is  laying in bed on his back due to back pain.  His wife is present in the room.  The patient says that recently he has been having pulmonary issues and was started on oxygen and went through a round of pulmonary rehab in the mid 2019.  He then proceeded to exercise at [employer] after completing pulmonary rehab.  He says that late last year he had been active working around the house and up on the roof.  He then developed the flu.  Since that time he has developed back pain and has not been active at all.  Over the past 2 weeks he has been flat on his back due to back pain.  When he was able to be active he denies having had any exertional chest pain/pressure/tightness.  He has chronic shortness of breath and uses oxygen at 2 L around-the-clock. No recnet chest pain.   Alfred Carney was a 1.5 pack/day smoker since about the age of 27 until he quit in 2008.  He has never had an MI or stroke although his father died of a stroke last year at age 59 and his son and sister have had mini strokes.  The patient has never had an MI or heart failure.  He does take daily Lasix for lower extremity edema and hypertension.  He has not had any orthopnea or PND.   -LHC 01/31/2014- proximal LAD with 40% stenosis, mid LAD with diffuse 30% stenosis, circumflex without obstructive disease, second OM branch with 40-50% stenosis, RCA with 40% proximal and 60% mid stenosis, EF 55-60%.  - LHC (10/2005):  Diagonals with 20%, mid CFX 20%, prox RCA 40%, mid RCA 60-70% (  small), EF 60%. - Med Rx  - Nuclear (09/2011):  No ischemia, EF 62%, Normal.  - ABI (09/2011):  B/L 1.0 (normal)   Past Medical History:  Diagnosis Date  . Chronic obstructive pulmonary disease ([place]) 2008   Moderate  . Colon polyps   . Community acquired pneumonia   . Coronary artery disease   . Degenerative joint disease   . Diabetes mellitus without complication ([place])    type 2  . Emphysema lung ([place])   . Enlarged liver    fatty liver by '09 CT  .  Gastroesophageal reflux disease   . Gout   . H/O hiatal hernia   . History of Rocky [place] spotted fever    Possible history of [place] Spotted Fever  . Hyperlipidemia   . Hypertension   . Hypokalemia    diuretic induced, resolved  . Microcytic anemia    iron pills  . Morbid obesity ([place])   . Shortness of breath   . Skin cancer    skin cancer lip removed  . Sleep apnea    not currently using CPAP 05/20/13  . 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 CARDIAC CATHETERIZATION  2007   Ejection fraction is estimated at 60%  . CHOLECYSTECTOMY    . COLONOSCOPY WITH PROPOFOL N/A 01/22/2018   Procedure: COLONOSCOPY WITH PROPOFOL;  Surgeon: Mauri Pole, MD;  Location: [facility];  Service: Endoscopy;  Laterality: N/A;  . DG NERVE ROOT BLOCK LUMBAR-SACRAL EACH ADD. LEVEL  10/23/2018      . ESOPHAGOGASTRODUODENOSCOPY (EGD) WITH PROPOFOL N/A 01/22/2018   Procedure: ESOPHAGOGASTRODUODENOSCOPY (EGD) WITH PROPOFOL;  Surgeon: Mauri Pole, MD;  Location: [facility];  Service: Endoscopy;  Laterality: N/A;  . HARDWARE REMOVAL Left 05/26/2013   Procedure: HARDWARE REMOVAL;  Surgeon: Newt Minion, MD;  Location: [facility];  Service: Orthopedics;  Laterality: Left;  Left Total Hip Arthroplasty, Removal of Deep Hardware  . HIP SURGERY     Status post left hip surgery with bone grafting  . IR INJECT/THERA/INC NEEDLE/CATH/PLC EPI/LUMB/SAC W/IMG  10/23/2018  . LEFT HEART CATHETERIZATION WITH CORONARY ANGIOGRAM N/A 01/31/2014   Procedure: LEFT HEART CATHETERIZATION WITH CORONARY ANGIOGRAM;  Surgeon: Burnell Blanks, MD;  Location: [facility] CATH LAB;  Service: Cardiovascular;  Laterality: N/A;  . THORACOABDOMINAL AORTIC ANEURYSM REPAIR     with right femoral and left iliac BPG and reimplantation of renal arteries.  . TONSILLECTOMY    . TONSILLECTOMY    . TOTAL HIP ARTHROPLASTY Left 05/26/2013    Procedure: TOTAL HIP ARTHROPLASTY;  Surgeon: Newt Minion, MD;  Location: [facility];  Service: Orthopedics;  Laterality: Left;  Left Total Hip Arthroplasty, Removal Deep Hardware     Home Medications:  Prior to Admission medications   Medication Sig Start Date End Date Taking? Authorizing Provider  albuterol (PROVENTIL HFA;VENTOLIN HFA) 108 (90 Base) MCG/ACT inhaler Inhale 2 puffs into the lungs every 6 (six) hours as needed for wheezing or shortness of breath. 01/06/18  Yes Tanda Rockers, MD  amLODipine (NORVASC) 5 MG tablet Take 5 mg by mouth at bedtime.  12/31/17  Yes [provider]  Calcium Carbonate-Simethicone (ALKA-SELTZER HEARTBURN + GAS) 750-80 MG CHEW Chew 1 tablet by mouth as needed (gas).    Yes [provider]  Cholecalciferol (VITAMIN D PO) Take 1 capsule by mouth daily.    Yes [provider]  fluticasone Asencion Islam)  50 MCG/ACT nasal spray Place 2 sprays into both nostrils daily. Patient taking differently: Place 2 sprays into both nostrils as needed for allergies or rhinitis.  08/19/18  Yes Lesleigh Noe, MD  furosemide (LASIX) 20 MG tablet Take 1 tablet (20 mg total) by mouth daily as needed. Patient taking differently: Take 20 mg by mouth daily.  06/28/15  Yes Tanda Rockers, MD  irbesartan (AVAPRO) 300 MG tablet Take 300 mg by mouth daily.  12/28/17  Yes [provider]  ketoconazole (NIZORAL) 2 % shampoo Apply 1 application topically as needed for irritation.  05/22/17  Yes [provider]  metoprolol succinate (TOPROL-XL) 25 MG 24 hr tablet Take 25 mg by mouth daily.   Yes [provider]  nitroGLYCERIN (NITROSTAT) 0.4 MG SL tablet Place 1 tablet (0.4 mg total) under the tongue every 5 (five) minutes x 3 doses as needed for chest pain. 01/31/14  Yes Isaiah Serge, NP  OXYGEN Inhale 2 L into the lungs continuous.    Yes [provider]  rosuvastatin (CRESTOR) 10 MG tablet Take 10 mg by mouth at bedtime.    Yes [provider]  tamsulosin (FLOMAX) 0.4 MG CAPS capsule Take 0.4 mg by mouth at bedtime.  10/20/17  Yes [provider]  omeprazole (PRILOSEC) 40 MG capsule Take 1 capsule (40 mg total) by mouth daily. 01/22/18   Mauri Pole, MD    Inpatient Medications: Scheduled Meds: . acetaminophen  650 mg Oral Q6H  . amLODipine  5 mg Oral QHS  . dexamethasone  4 mg Intravenous Q6H  . gabapentin  300 mg Oral TID  . insulin aspart  0-15 Units Subcutaneous TID WC  . irbesartan  300 mg Oral Daily  . metoprolol succinate  25 mg Oral Daily  . pantoprazole  40 mg Oral Daily  . rosuvastatin  10 mg Oral QHS  . sodium chloride flush  3 mL Intravenous Q12H  . tamsulosin  0.4 mg Oral QHS   Continuous Infusions: . sodium chloride     PRN Meds: sodium chloride, ipratropium-albuterol, lidocaine (PF), ondansetron **OR** ondansetron (ZOFRAN) IV, oxyCODONE, oxyCODONE, polyethylene glycol, sodium chloride flush, traZODone  Allergies:   No Known Allergies  Social History:   Social History   Socioeconomic History  . Marital status: Married    Spouse name: Vaughan Basta  . Number of children: 4  . Years of education: GED, ITT  . Highest education level: Not on file  Occupational History  . Occupation: architectural drawing-retired  Social Needs  . Financial resource strain: Not hard at all  . Food insecurity:    Worry: Not on file    Inability: Not on file  . Transportation needs:    Medical: Not on file    Non-medical: Not on file  Tobacco Use  . Smoking status: Former Smoker    Packs/day: 1.00    Years: 35.00    Pack years: 35.00    Types: Cigarettes    Last attempt to quit: 09/16/2006    Years since quitting: 12.1  . Smokeless tobacco: Never Used  Substance and Sexual Activity  . Alcohol use: No    Alcohol/week: 0.0 standard drinks  . Drug use: No  . Sexual activity: Not Currently  Lifestyle  . Physical activity:    Days per week: Not on file    Minutes per session: Not on file  .  Stress: Not on file  Relationships  . Social connections:    Talks  on phone: Not on file    Gets together: Not on file    Attends religious service: Not on file    Active member of club or organization: Not on file    Attends meetings of clubs or organizations: Not on file    Relationship status: Not on file  . Intimate partner violence:    Fear of current or ex partner: Not on file    Emotionally abused: Not on file    Physically abused: Not on file    Forced sexual activity: Not on file  Other Topics Concern  . Not on file  Social History Narrative   Retired from [employer] work    Lives on [address]   Married to [place], [place], Charlett Blake   Has 10 grandchildren, even great grandchildren   Family is scattered - some in [place], [place], [place], [place]   Enjoys - playing guitar   Exercise - participates in [employer] 3 times week - walking on the treadmill and pedel activity   Diet - losing 15 lbs, following diabetic diet    Family History:    Family History  Problem Relation Age of Onset  . Osteoarthritis Mother   . Diabetes Mother   . Pulmonary embolism Mother   . Heart disease Father        Coronary Artery Disease  . Stroke Father   . Multiple sclerosis Sister   . Factor V Leiden deficiency Sister   . Emphysema Sister   . Hyperlipidemia Brother   . Lung cancer Maternal Grandfather        smoked  . Heart attack Maternal Grandmother      ROS:  Please see the history of present illness.   All other ROS reviewed and negative.     Physical Exam/Data:   Vitals:   10/25/18 0523 10/25/18 1438 10/25/18 2155 10/26/18 1318  BP: (!) 147/64 (!) 118/57 (!) 157/60 135/67  Pulse: (!) 58 (!) 55 (!) 56 (!) 52  Resp: 18  14 17   Temp: 98.2 F (36.8 C) 98.1 F (36.7 C) (!) 97.4 F (36.3 C) (!) 97.5 F (36.4 C)  TempSrc: Oral Oral Oral Oral  SpO2:  95% 96% 93%  Weight:      Height:        Intake/Output Summary (Last 24 hours) at 10/26/2018  1408 Last data filed at 10/26/2018 1100 Gross per 24 hour  Intake 1080 ml  Output -  Net 1080 ml   Last 3 Weights 10/23/2018 08/19/2018 08/17/2018  Weight (lbs) 296 lb 14.4 oz 313 lb 8 oz 308 lb  Weight (kg) 134.673 kg 142.203 kg 139.708 kg     Body mass index is 43.84 kg/m.  General:  Well nourished, well developed, obese male in no acute distress HEENT: normal Lymph: no adenopathy Neck: no JVD Endocrine:  No thryomegaly Vascular: No carotid bruits; FA pulses 2+ bilaterally  Cardiac:  normal S1, S2; RRR; no murmur  Lungs:  clear to auscultation bilaterally, no wheezing, rhonchi or rales  Abd: soft, mild tenderness to palpation left upper quadrant (patient relates to prior surgery), no hepatomegaly  Ext: no edema Musculoskeletal:  No deformities, BUE and BLE strength normal and equal Skin: warm and dry  Neuro:  CNs 2-12 intact, no focal abnormalities noted Psych:  Normal affect   EKG:  The EKG was personally reviewed and demonstrates:  Normal sinus rhythm, 64 bpm, Low voltage QRS, Inferior infarct , age undetermined, Cannot  rule out Anterior infarct , age undetermined, abnormal r wave progression. QTC 404 Telemetry: Patient is not on telemetry  Relevant CV Studies:  Left heart catheterization 01/31/2014 Hemodynamic Findings: Central aortic pressure: 142/66 Left ventricular pressure: 148/4/21  Angiographic Findings:  Left main: Long horizontal left main with no obstructive disease.   Left Anterior Descending Artery: Large caliber tortuous vessel that barely reaches the apex. The proximal vessel has a 40% stenosis. The mid vessel has diffuse 30% stenosis. The first diagonal branch is moderate in caliber with no obstructive disease. The second diagonal branch is moderate in caliber with mild plaque.    Circumflex Artery: Large caliber vessel with large caliber first obtuse marginal branch without obstructive disease, followed by a moderate caliber second obtuse marginal branch.  The second OM branch has 40-50% stenosis just after the takeoff from the AV groove Circumflex.   Right Coronary Artery: Small non-dominant vessel with 40% proximal stenosis, 60% mid stenosis.   Left Ventricular Angiogram: LVEF=55-60%.   Impression: 1. Mild non-obstructive CAD 2. Normal LV systolic function  Recommendations: Continue medical management of CAD. Discharge home later today. Follow up with Dr. Martinique in 2-3 weeks.          Laboratory Data:  Chemistry Recent Labs  Lab 10/21/18 1154  NA 142  K 3.5  CL 103  CO2 25  GLUCOSE 149*  BUN 14  CREATININE 1.20  CALCIUM 9.4  GFRNONAA >60  GFRAA >60  ANIONGAP 14    No results for input(s): PROT, ALBUMIN, AST, ALT, ALKPHOS, BILITOT in the last 168 hours. Hematology Recent Labs  Lab 10/21/18 1154  WBC 10.7*  RBC 5.60  HGB 16.4  HCT 50.5  MCV 90.2  MCH 29.3  MCHC 32.5  RDW 13.8  PLT 189   Cardiac EnzymesNo results for input(s): TROPONINI in the last 168 hours. No results for input(s): TROPIPOC in the last 168 hours.  BNPNo results for input(s): BNP, PROBNP in the last 168 hours.  DDimer No results for input(s): DDIMER in the last 168 hours.  Radiology/Studies:  Ir Reyne Dumas Diag/thera/inc Needle/cath/plc Epi/lumb/sac W/img  Result Date: 10/23/2018 CLINICAL DATA:  Left L3-4 foraminal to extraforaminal disc herniation with left L3 radicular symptoms. EXAM: Left L3 nerve root block and trans foraminal epidural COMPARISON:  MRI 10/21/2018 PROCEDURE: The procedure was discussed in depth with the patient. Relevant imaging was reviewed. The patient was placed prone on the fluoroscopic table and a betadine scrub of the low back was performed. The patient was draped in sterile fashion. Skin and subcutaneous anesthesia was carried out using 1% lidocaine. A curved 5 inch 22 gauge spinal needle was directed under fluoroscopic guidance adjacent to the nerve. Isovue 200 was injected for confirmation of needle placement. Contrast  outlines the nerve root and shows selective epidural spread. No venous communication. 120 mg of Depo-Medrol mixed with 2 ml of 1% lidocaine were injected. The procedure was well tolerated. The patient was returned to the floor in good condition. No additional postprocedure orders. IMPRESSION: Technically successful left L3 nerve root block and transforaminal epidural Electronically Signed   By: Nelson Chimes M.D.   On: 10/23/2018 15:24    Assessment and Plan:   Preoperative evaluation -Patient has a history of mild, nonobstructive CAD with most recent cath in 01/2014.  He has a significant history of respiratory disease with COPD, OSA and OHS, requiring home oxygen therapy.  He also has orbit obesity and diabetes-not on insulin.  -No recent or current chest pain.  -  The patient had been active up to late last year however his activity level has been significantly impeded after he had the flu late last year and then with his back pain.  Currently he is probably unable to achieve 4 METS of activity due to back pain. -According to the RCRI risk calculator this patient is a class I risk with 0.4% risk of major cardiac event, however, with his significant pulmonary disease, this will likely increase his risk for perioperative complications including intubation.  The patient has also been seen by pulmonology with their recommendations. -With no angina or heart failure type symptoms would not pursue any cardiac testing. Ok to proceed with surgery.  OSA/chronic hypoxic resp failure -On chronic oxygen therapy. -Has been unable to tolerate CPAP therapy for OSA.  -Management per primary team and pulmonology.  Hypertension -Home medications include Norvasc 5 mg daily irbesartan 300 mg daily Toprol-XL 25 mg daily -Blood pressures well controlled for the most part  Hyperlipidemia -On rosuvastatin 10 mg daily.  Per primary care notes in care everywhere, LDL was 41 in 12/2017  Diabetes type 2 -Last A1c in care  everywhere was 6.8 in 07/2018 -Patient was previously on metformin however this caused loose bowel movements so he stopped taking it.  Morbid obesity -Body mass index is 43.84 kg/m. -This probably contributes to his limited activity level as well as his sleep apnea and chronic hypoxic respiratory failure requiring oxygen.   For questions or updates, please contact [place] Please consult [email or web address] for contact info under     Signed, Daune Perch, NP  10/26/2018 2:08 PM

## 2018-10-26 NOTE — Plan of Care (Signed)
  Problem: Education: Goal: Knowledge of General Education information will improve Description: Including pain rating scale, medication(s)/side effects and non-pharmacologic comfort measures Outcome: Progressing   Problem: Clinical Measurements: Goal: Ability to maintain clinical measurements within normal limits will improve Outcome: Progressing Goal: Will remain free from infection Outcome: Progressing   

## 2018-10-26 NOTE — Consult Note (Signed)
NAME:  Alfred Carney, MRN:  [id number], DOB:  May 02, 1951, LOS: 4 ADMISSION DATE:  10/21/2018, CONSULTATION DATE: 10/26/2018 REFERRING MD: [facility], CHIEF COMPLAINT: Preop surgery clearance  Brief History   68 year old with chronic back pain refractory to treatment been evaluated for back surgery  History of present illness   68 year old with chronic back pain refractory to outpatient treatment and inpatient treatment with plan to have microdiscectomy of L3-4 10/28/2018 by neurosurgery.  His extensive past medical history is been well documented below that includes OSA and OHS with noncompliance to CPAP.  Orbit obesity with a weight of 300 pounds a BMI greater than 45.  Known COPD with long-term tobacco abuse quit in 2008.  He has it in a residual volume of 41%.  He requires O2 2 L 24/7 due to his noncompliance with CPAP.  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  Smoking at this time but subsequently quit. Pulmonary critical care has been asked to evaluate for impending surgery.  He is at higher risk than normal for winding up on full mechanical ventilatory support in intensive care unit.  Will need close monitoring most likely in the intensive care unit and noninvasive mechanical ventilatory support while begin pain medications.  Pulmonary critical care is glad to follow along with you.  Past Medical History  COPD Gold 2 Oxygen dependency OSA OHS Aortic aneurysm repair 2008 Peripheral artery disease Retention 1.3 cm left lower lobe nodule is been evaluated by CT scan and PET scan is non-hypermetabolic   Significant Hospital Events     Consults:  10/26/2018 pulmonary Neurosurgery  Procedures:    Significant Diagnostic Tests:    Micro Data:    Antimicrobials:    Interim history/subjective:  68 year old morbid obese male with chronic back pain refractory to noninvasive interventions with a plan to have L3-4 micro discectomy surgery and is at higher risk of complications  Objective     Blood pressure (!) 157/60, pulse (!) 56, temperature (!) 97.4 F (36.3 C), temperature source Oral, resp. rate 14, height 5\' 9"  (1.753 m), weight 134.7 kg, SpO2 96 %.        Intake/Output Summary (Last 24 hours) at 10/26/2018 1229 Last data filed at 10/26/2018 1100 Gross per 24 hour  Intake 1320 ml  Output -  Net 1320 ml   Filed Weights   10/23/18 0525  Weight: 134.7 kg    Examination: General: Morbidly obese male who is hard of hearing and in no acute distress complains of back pain HENT: No neck, no JVD appreciated Lungs: deCreased air movement without wheezes Cardiovascular: Heart sounds are distant Abdomen: Abdomen is obese soft positive bowel sounds Extremities: 2+ edema Neuro: Follows commands moves all extremities right lower extremity is painful   Resolved Hospital Problem list     Assessment & Plan:  Chronic structural pulmonary disease in a setting of multiple years of smoking having quit in 2008.  He has obstructive sleep apnea and obesity  hypoventilation syndrome and is noncompliant with CPAP He has restriction noted by and residual volume of 41% on PFTs done in 2016 He has a 1.3 cm nodule left lower lobe that is non-hypermetabolic per PET scan performed in 2019. He is O2 dependent at 2 L nasal cannula He is morbidly obese at 300 pounds with a BMI of 55.26. Pulmonary has been called to evaluate him for preoperative clearance for an L3-4 microdiscectomy per Dr. Sherwood Gambler scheduled for 10/28/2018 Continue O2 at current rate He will most likely need to  be admitted to the intensive care postoperatively for close monitoring. He has refused noninvasive mechanical dilatory support in the past but may need it post procedure prevent respiratory distress. May require intubation and mechanical ventilatory support postop It was explained to him he may be have a higher chance of complications due to his OSA, OHS, COPD and morbid obesity.  He is well aware of the possible  complications that his pain is been refractory to noninvasive procedures and he is agreed to have lumbar surgery by neurosurgery.  Best practice:  Diet: Regular diet Pain/Anxiety/Delirium protocol (if indicated): Right back pain refractory to current treatment VAP protocol (if indicated): Not applicable DVT prophylaxis: Subcu heparin GI prophylaxis: PPI Glucose control: Sliding scale insulin Mobility: As tolerated.  Severely limited due to pain Code Status: Full Family Communication: Patient and wife updated at bedside 10/26/2018 Disposition: Plan is for surgery 10/28/2018  Labs   CBC: Recent Labs  Lab 10/21/18 1154  WBC 10.7*  NEUTROABS 7.6  HGB 16.4  HCT 50.5  MCV 90.2  PLT 606    Basic Metabolic Panel: Recent Labs  Lab 10/21/18 1154  NA 142  K 3.5  CL 103  CO2 25  GLUCOSE 149*  BUN 14  CREATININE 1.20  CALCIUM 9.4   GFR: Estimated Creatinine Clearance: 80.3 mL/min (by C-G formula based on SCr of 1.2 mg/dL). Recent Labs  Lab 10/21/18 1154  WBC 10.7*    Liver Function Tests: No results for input(s): AST, ALT, ALKPHOS, BILITOT, PROT, ALBUMIN in the last 168 hours. No results for input(s): LIPASE, AMYLASE in the last 168 hours. No results for input(s): AMMONIA in the last 168 hours.  ABG No results found for: PHART, PCO2ART, PO2ART, HCO3, TCO2, ACIDBASEDEF, O2SAT   Coagulation Profile: No results for input(s): INR, PROTIME in the last 168 hours.  Cardiac Enzymes: No results for input(s): CKTOTAL, CKMB, CKMBINDEX, TROPONINI in the last 168 hours.  HbA1C: Hgb A1c MFr Bld  Date/Time Value Ref Range Status  05/08/2014 12:03 AM 6.6 (H) <5.7 % Final    Comment:    (NOTE)                                                                       According to the ADA Clinical Practice Recommendations for 2011, when HbA1c is used as a screening test:  >=6.5%   Diagnostic of Diabetes Mellitus           (if abnormal result is confirmed) 5.7-6.4%   Increased risk  of developing Diabetes Mellitus References:Diagnosis and Classification of Diabetes Mellitus,Diabetes TKZS,0109,32(TFTDD 1):S62-S69 and Standards of Medical Care in         Diabetes - 2011,Diabetes UKGU,5427,06 (Suppl 1):S11-S61.    CBG: Recent Labs  Lab 10/25/18 1136 10/25/18 1647 10/25/18 1926 10/26/18 0756 10/26/18 1150  GLUCAP 224* 241* 284* 210* 200*    Review of Systems:   10 point review of system taken, please see HPI for positives and negatives.   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   Surgical History    Past Surgical History:  Procedure Laterality Date  . CARDIAC CATHETERIZATION  2007   Ejection fraction is estimated at 60%  . CHOLECYSTECTOMY    . COLONOSCOPY WITH PROPOFOL N/A 01/22/2018   Procedure: COLONOSCOPY WITH PROPOFOL;  Surgeon: Mauri Pole, MD;  Location: [facility];  Service: Endoscopy;  Laterality: N/A;  . DG NERVE ROOT BLOCK LUMBAR-SACRAL EACH ADD. LEVEL  10/23/2018      . ESOPHAGOGASTRODUODENOSCOPY (EGD) WITH PROPOFOL N/A 01/22/2018   Procedure: ESOPHAGOGASTRODUODENOSCOPY (EGD) WITH PROPOFOL;  Surgeon: Mauri Pole, MD;  Location: [facility];  Service: Endoscopy;  Laterality: N/A;  . HARDWARE REMOVAL Left 05/26/2013   Procedure: HARDWARE REMOVAL;  Surgeon: Newt Minion, MD;  Location: [place];  Service: Orthopedics;  Laterality: Left;  Left Total Hip Arthroplasty, Removal of Deep Hardware  . HIP SURGERY     Status post left hip  surgery with bone grafting  . IR INJECT/THERA/INC NEEDLE/CATH/PLC EPI/LUMB/SAC W/IMG  10/23/2018  . LEFT HEART CATHETERIZATION WITH CORONARY ANGIOGRAM N/A 01/31/2014   Procedure: LEFT HEART CATHETERIZATION WITH CORONARY ANGIOGRAM;  Surgeon: Burnell Blanks, MD;  Location: [facility] CATH LAB;  Service: Cardiovascular;  Laterality: N/A;  . THORACOABDOMINAL AORTIC ANEURYSM REPAIR     with right femoral and left iliac BPG and reimplantation of renal arteries.  . TONSILLECTOMY    . TONSILLECTOMY    . TOTAL HIP ARTHROPLASTY Left 05/26/2013   Procedure: TOTAL HIP ARTHROPLASTY;  Surgeon: Newt Minion, MD;  Location: [place];  Service: Orthopedics;  Laterality: Left;  Left Total Hip Arthroplasty, Removal Deep Hardware     Social History   reports that he quit smoking about 12 years ago. His smoking use included cigarettes. He has a 35.00 pack-year smoking history. He has never used smokeless tobacco. He reports that he does not drink alcohol or use drugs.   Family History   His family history includes Diabetes in his mother; Emphysema in his sister; Factor V Leiden deficiency in his sister; Heart attack in his maternal grandmother; Heart disease in his father; Hyperlipidemia in his brother; Lung cancer in his maternal grandfather; Multiple sclerosis in his sister; Osteoarthritis in his mother; Pulmonary embolism in his mother; Stroke in his father.   Allergies No Known Allergies   Home Medications  Prior to Admission medications   Medication Sig Start Date End Date Taking? Authorizing Provider  albuterol (PROVENTIL HFA;VENTOLIN HFA) 108 (90 Base) MCG/ACT inhaler Inhale 2 puffs into the lungs every 6 (six) hours as needed for wheezing or shortness of breath. 01/06/18  Yes Tanda Rockers, MD  amLODipine (NORVASC) 5 MG tablet Take 5 mg by mouth at bedtime.  12/31/17  Yes [provider]  Calcium Carbonate-Simethicone (ALKA-SELTZER HEARTBURN + GAS) 750-80 MG CHEW Chew 1 tablet by mouth as needed  (gas).    Yes [provider]  Cholecalciferol (VITAMIN D PO) Take 1 capsule by mouth daily.    Yes [provider]  fluticasone (FLONASE) 50 MCG/ACT nasal spray Place 2 sprays into both nostrils daily. Patient taking differently: Place 2 sprays into both nostrils as needed for allergies or rhinitis.  08/19/18  Yes Lesleigh Noe, MD  furosemide (LASIX) 20 MG tablet Take 1 tablet (20 mg total) by mouth daily as needed. Patient taking differently: Take 20 mg by mouth daily.  06/28/15  Yes Tanda Rockers, MD  irbesartan (AVAPRO) 300 MG tablet Take 300 mg by mouth daily.  12/28/17  Yes [provider]  ketoconazole (NIZORAL) 2 % shampoo Apply 1 application topically as needed for irritation.  05/22/17  Yes [provider]  metoprolol succinate (TOPROL-XL) 25 MG 24 hr tablet Take 25 mg by mouth daily.   Yes [provider]  nitroGLYCERIN (NITROSTAT) 0.4 MG SL tablet Place 1 tablet (0.4 mg total) under the tongue every 5 (five) minutes x 3 doses as needed for chest pain. 01/31/14  Yes Isaiah Serge, NP  OXYGEN Inhale 2 L into the lungs continuous.    Yes [provider]  rosuvastatin (CRESTOR) 10 MG tablet Take 10 mg by mouth at bedtime.    Yes [provider]  tamsulosin (FLOMAX) 0.4 MG CAPS capsule Take 0.4 mg by mouth at bedtime.  10/20/17  Yes [provider]  omeprazole (PRILOSEC) 40 MG capsule Take 1 capsule (40 mg total) by mouth daily. 01/22/18   Mauri Pole, MD         Richardson Landry  ACNP Maryanna Shape PCCM Pager [phone] till 1 pm If no answer page [phone] 10/26/2018, 12:29 PM

## 2018-10-26 NOTE — Progress Notes (Signed)
Subjective: Patient continuing to have disabling left lumbar radicular pain from the left superior buttock into the anterior left thigh.  Little or no response to the left L3-4 transforaminal epidural steroid injection (left L3 SNRB) 3 days ago.  Barely able to find a comfortable position to lay, and frequently adjusting position.  Cannot sit due to disabling pain.  Ambulates to the bathroom with rolling walker with significant pain.  Objective: Vital signs in last 24 hours: Vitals:   10/24/18 1941 10/25/18 0523 10/25/18 1438 10/25/18 2155  BP: (!) 145/44 (!) 147/64 (!) 118/57 (!) 157/60  Pulse: 63 (!) 58 (!) 55 (!) 56  Resp: 18 18  14   Temp: 97.7 F (36.5 C) 98.2 F (36.8 C) 98.1 F (36.7 C) (!) 97.4 F (36.3 C)  TempSrc: Oral Oral Oral Oral  SpO2: 97%  95% 96%  Weight:      Height:        Intake/Output from previous day: 02/09 0701 - 02/10 0700 In: 720 [P.O.:720] Out: -  Intake/Output this shift: No intake/output data recorded.  Physical Exam: Continues on O2 via nasal cannula.  Mild weakness in proximal left lower extremity 4 to 4+/5 in left iliopsoas and quadriceps (difficult to know whether pain is limiting his ability to exert full effort).  Assessment/Plan: Spoke with patient at length at his bedside regarding options for further treatment and care.  In the end his pain is no better controlled and he is no more mobile than at the time of admission, despite an epidural steroid injection, Decadron 6 mg IV every 6 hours, gabapentin 300 milligrams 3 times daily, significant amounts of narcotic medications, Toradol IV, and bedrest.  From a surgical perspective he would require a left L3-4 extraforaminal microdiscectomy.  I explained to the patient that he has significantly greater risks than the typical patient due to his multiple significant medical comorbidities including longstanding diabetes, history of respiratory disease (on home O2), history of coronary artery disease,  diabetes mellitus, hypertension, and peripheral vascular disease.  I have discussed the nature of the surgical procedure and its risks including risk of infection, bleeding, possibly for transfusion, the risk of increased nerve root dysfunction, with pain, weakness, numbness, and/or tingling, the risk of recurrent disc herniation and the need for further surgery, and anesthetic risks including, but not limited to myocardial infarction, stroke, pneumonia, pulmonary embolism, and death.  He understands that all of these risks are increased due to his multiple medical comorbidities.  Despite that, he feels that he cannot continue to live with and be limited by the disabling, bedridding pain that he is experiencing and he wishes to proceed with surgery and accepts those risks.  He continues with SCDs.  We will look towards scheduling surgery on the morning of February 12, which should allow the [facility] hospitalist service to optimize him from a medical perspective for surgery and to clear him for surgery and general anesthesia.  I have ordered a CBC with differential and Bmet for tomorrow morning, and the EKG for today.  We appreciate the assistance of the [facility] hospitalist service.  I have stopped his Toradol, and reduced his Decadron to 4 mg IV every 6 hours.  I have requested a consent for surgery.  I have spoken with Dr. Marthenia Rolling ([facility] hospitalist) by phone this morning.  He will proceed with preoperative clearance for surgery and general anesthesia.  He explained that he will obtain an echocardiogram, and anticipates obtaining preoperative cardiology consultation and/or pulmonary consultation.  I  appreciate his assistance.  I have explained to the patient that postoperatively he will need to continue with PT and OT, and will need rehabilitation, either in the [facility] hospital inpatient rehabilitation unit or at a skilled nursing facility, to facilitate his mobility, transfers, and  reconditioning.  Hosie Spangle, MD 10/26/2018, 8:48 AM

## 2018-10-26 NOTE — Progress Notes (Signed)
Occupational Therapy Treatment Patient Details Name: Markham Dumlao MRN: [id number] DOB: 01/16/1951 Today's Date: 10/26/2018    History of present illness Mr. Baze is a 68 yo male who presented to the ED with approximately 1 week history of pain which originates over his left buttock area and lower back. Significant PMH of L THA in 2014, chronic respiratory failure, morbid obesity, type 2 diabetes.   OT comments  Pt presenting supine in bed with wife at bedside. Pt reports pain in the lower back. OT informed that pt will receive back procedure on Wednesday (10/28/18). Pt re-educated on importance of back precautions and use of AE to decrease pain and movement when performing tasks. Pt demonstrated supervision bed mobility with no assist to power up to sitting. Pt supervision with functional transfers to toilet for safety. Pt reports pain when ambulating due to pressure on LE. OT will continue to follow acutely after surgery to ensure safe transition to next venue.     Follow Up Recommendations  No OT follow up;Supervision - Intermittent    Equipment Recommendations       Recommendations for Other Services      Precautions / Restrictions Precautions Precautions: Fall Precaution Comments: Teaching back precautions due to pain with disc herniation. Handout provided, pt wife and daughter present during session. Restrictions Weight Bearing Restrictions: No       Mobility Bed Mobility Overal bed mobility: Needs Assistance Bed Mobility: Sidelying to Sit;Sit to Supine;Rolling Rolling: Supervision Sidelying to sit: Supervision   Sit to supine: Modified independent (Device/Increase time)   General bed mobility comments: cues for back precautions ; sit to supine unaided  Transfers Overall transfer level: Needs assistance Equipment used: Rolling walker (2 wheeled) Transfers: Sit to/from Stand Sit to Stand: Supervision         General transfer comment: Pt observed to tolerate  transition with no signs of dizziness or pain.     Balance Overall balance assessment: Mild deficits observed, not formally tested                                         ADL either performed or assessed with clinical judgement   ADL Overall ADL's : Needs assistance/impaired             Lower Body Bathing: Supervison/ safety;Cueing for safety;Cueing for sequencing       Lower Body Dressing: Supervision/safety;Cueing for safety;Cueing for sequencing;Sit to/from stand   Toilet Transfer: Min guard;Cueing for safety;Cueing for sequencing;Ambulation;RW;Grab bars           Functional mobility during ADLs: Min guard General ADL Comments: Pt really impulsive and still not mindful of safety awareness when completing ADLs required Max VCs for reminders to adhere to precautions and body mechanics.     Vision       Perception     Praxis      Cognition Arousal/Alertness: Awake/alert Behavior During Therapy: WFL for tasks assessed/performed Overall Cognitive Status: Within Functional Limits for tasks assessed                                          Exercises     Shoulder Instructions       General Comments wife present during session.    Pertinent Vitals/ Pain  Pain Assessment: 0-10 Pain Score: 10-Worst pain ever Pain Location: L hip posterior thigh Pain Descriptors / Indicators: Aching;Sharp Pain Intervention(s): Monitored during session  Home Living                                          Prior Functioning/Environment              Frequency  Min 2X/week        Progress Toward Goals  OT Goals(current goals can now be found in the care plan section)  Progress towards OT goals: Progressing toward goals(slows progression, pt scheduled for back surgery on 10/28/18)  Acute Rehab OT Goals Patient Stated Goal: stop hurting OT Goal Formulation: With patient/family Time For Goal Achievement:  11/07/18 Potential to Achieve Goals: Good ADL Goals Pt Will Perform Lower Body Bathing: with modified independence;sit to/from stand Pt Will Perform Lower Body Dressing: with modified independence;sit to/from stand Pt Will Transfer to Toilet: with modified independence;ambulating;bedside commode Pt Will Perform Tub/Shower Transfer: 3 in 1;Tub transfer;ambulating  Plan Discharge plan remains appropriate    Co-evaluation                 AM-PAC OT "6 Clicks" Daily Activity     Outcome Measure   Help from another person eating meals?: None Help from another person taking care of personal grooming?: None Help from another person toileting, which includes using toliet, bedpan, or urinal?: A Little(cueing for safety and body mechanics) Help from another person bathing (including washing, rinsing, drying)?: A Little Help from another person to put on and taking off regular upper body clothing?: None Help from another person to put on and taking off regular lower body clothing?: A Little 6 Click Score: 21    End of Session Equipment Utilized During Treatment: Gait belt;Rolling walker  OT Visit Diagnosis: Unsteadiness on feet (R26.81);Pain Pain - Right/Left: Left Pain - part of body: Hip(lower back)   Activity Tolerance Patient tolerated treatment well   Patient Left in bed;with call bell/phone within reach;with family/visitor present   Nurse Communication Precautions        Time: 3474-2595 OT Time Calculation (min): 19 min  Charges: OT General Charges $OT Visit: 1 Visit OT Treatments $Self Care/Home Management : 8-22 mins  Minus Breeding, MSOT, OTR/L  Supplemental Rehabilitation Services  [phone]   Marius Ditch 10/26/2018, 4:40 PM

## 2018-10-27 ENCOUNTER — Inpatient Hospital Stay (HOSPITAL_COMMUNITY): Payer: Medicare HMO

## 2018-10-27 DIAGNOSIS — I2510 Atherosclerotic heart disease of native coronary artery without angina pectoris: Secondary | ICD-10-CM

## 2018-10-27 LAB — GLUCOSE, CAPILLARY
Glucose-Capillary: 193 mg/dL — ABNORMAL HIGH (ref 70–99)
Glucose-Capillary: 191 mg/dL — ABNORMAL HIGH (ref 70–99)
Glucose-Capillary: 188 mg/dL — ABNORMAL HIGH (ref 70–99)
Glucose-Capillary: 200 mg/dL — ABNORMAL HIGH (ref 70–99)
Glucose-Capillary: 226 mg/dL — ABNORMAL HIGH (ref 70–99)

## 2018-10-27 LAB — CBC WITH DIFFERENTIAL/PLATELET
Abs Immature Granulocytes: 0.09 10*3/uL — ABNORMAL HIGH (ref 0.00–0.07)
Basophils Absolute: 0 10*3/uL (ref 0.0–0.1)
Basophils Relative: 0 %
Eosinophils Absolute: 0 10*3/uL (ref 0.0–0.5)
Eosinophils Relative: 0 %
HCT: 50.4 % (ref 39.0–52.0)
Hemoglobin: 16.7 g/dL (ref 13.0–17.0)
Immature Granulocytes: 1 %
Lymphocytes Relative: 8 %
Lymphs Abs: 1 10*3/uL (ref 0.7–4.0)
MCH: 29.6 pg (ref 26.0–34.0)
MCHC: 33.1 g/dL (ref 30.0–36.0)
MCV: 89.2 fL (ref 80.0–100.0)
Monocytes Absolute: 0.8 10*3/uL (ref 0.1–1.0)
Monocytes Relative: 7 %
Neutro Abs: 9.9 10*3/uL — ABNORMAL HIGH (ref 1.7–7.7)
Neutrophils Relative %: 84 %
Platelets: 127 10*3/uL — ABNORMAL LOW (ref 150–400)
RBC: 5.65 MIL/uL (ref 4.22–5.81)
RDW: 13.6 % (ref 11.5–15.5)
WBC: 11.7 10*3/uL — ABNORMAL HIGH (ref 4.0–10.5)
nRBC: 0 % (ref 0.0–0.2)

## 2018-10-27 LAB — BASIC METABOLIC PANEL
Anion gap: 14 (ref 5–15)
BUN: 38 mg/dL — ABNORMAL HIGH (ref 8–23)
CO2: 20 mmol/L — ABNORMAL LOW (ref 22–32)
Calcium: 8.6 mg/dL — ABNORMAL LOW (ref 8.9–10.3)
Chloride: 107 mmol/L (ref 98–111)
Creatinine, Ser: 1.27 mg/dL — ABNORMAL HIGH (ref 0.61–1.24)
GFR calc Af Amer: >60 mL/min (ref >60)
GFR calc non Af Amer: 58 mL/min — ABNORMAL LOW (ref >60)
Glucose, Bld: 225 mg/dL — ABNORMAL HIGH (ref 70–99)
Potassium: 5.1 mmol/L (ref 3.5–5.1)
Sodium: 141 mmol/L (ref 135–145)

## 2018-10-27 LAB — ECHOCARDIOGRAM COMPLETE
Height: 69 in
Weight: 4750.4 oz

## 2018-10-27 MED ORDER — CHLORHEXIDINE GLUCONATE CLOTH 2 % EX PADS
6.0000 | MEDICATED_PAD | Freq: Once | CUTANEOUS | Status: AC
  Administered 2018-10-27: 6 via TOPICAL

## 2018-10-27 MED ORDER — PERFLUTREN LIPID MICROSPHERE
1.0000 mL | INTRAVENOUS | Status: AC | PRN
  Administered 2018-10-27: 3 mL via INTRAVENOUS
  Filled 2018-10-27: qty 10

## 2018-10-27 MED ORDER — CHLORHEXIDINE GLUCONATE CLOTH 2 % EX PADS: 6.0000 | MEDICATED_PAD | Freq: Once | CUTANEOUS | Status: DC

## 2018-10-27 MED ORDER — DEXTROSE 5 % IV SOLN
3.0000 g | INTRAVENOUS | Status: AC
  Administered 2018-10-28: 3 g via INTRAVENOUS
  Filled 2018-10-27: qty 3

## 2018-10-27 MED ORDER — DEXAMETHASONE SODIUM PHOSPHATE 4 MG/ML IJ SOLN
4.0000 mg | Freq: Three times a day (TID) | INTRAMUSCULAR | Status: DC
  Administered 2018-10-27 – 2018-10-29 (×5): 4 mg via INTRAVENOUS
  Filled 2018-10-27 (×7): qty 1

## 2018-10-27 NOTE — Care Management Important Message (Signed)
Important Message  Patient Details  Name: Alfred Carney MRN: [id number] Date of Birth: Aug 14, 1951   Medicare Important Message Given:  Yes    Mariama [address] 10/27/2018, 4:04 PM

## 2018-10-27 NOTE — Plan of Care (Signed)
  Problem: Education: Goal: Knowledge of General Education information will improve Description Including pain rating scale, medication(s)/side effects and non-pharmacologic comfort measures Outcome: Progressing   

## 2018-10-27 NOTE — Progress Notes (Signed)
PROGRESS NOTE    Alfred Carney  DUK:[id number] DOB: 01-10-1951 DOA: 10/21/2018 PCP: Lesleigh Noe, MD   Brief Narrative: Patient is a 68 year old male with past medical history of left total hip replacement, COPD, chronic respiratory failure on 2 L of oxygen at home, sleep apnea but not tolerating CPAP, multivessel coronary artery disease who presented to the emergency department with complaints of left-sided hip and back pain.  He was having shooting pain on his left upper thigh.  MRI of the lumbar spine showed L3-L4 left foraminal disc protrusion impinging left L3 nerve root, left-sided foraminal stenosis, surrounding edema, lumbar spondylosis with L4-L5 severe right foraminal stenosis.  Orthopedics and neurosurgery consulted.  Patient underwent blocking of the left L3 nerve root, but has continued to report significant pain.  Patient had discussed with the neurosurgeon that he would prefer the disc problem and radiculopathy managed medically, however, due to persistent back pain, patient has opted for surgical approach.  Surgery intends to operate on the patient tomorrow, 10/28/2018.  Pulmonary and cardiology teams have been consulted for optimization prior to surgery.  Echocardiogram is pending.  Assessment & Plan:   Principal Problem:   Left hip pain Active Problems:   CAD in native artery   Hyperlipidemia   Essential hypertension   COPD GOLD II   Morbid obesity due to excess calories (HCC)   Chronic respiratory failure with hypoxia (HCC)   Iron deficiency anemia due to chronic blood loss   Solitary pulmonary nodule on lung CT   Back pain   Left hip pain:  Secondary to lumbar radiculopathy.   MRI findings as above.   Patient has undergone blocking of the left L3 nerve root, but has continued to have pain. Neurosurgery intends to proceed with surgery tomorrow.  Continue to optimize pain.  Cardiology and pulmonary teams have been consulted to optimize patient prior to  surgery.  COPD:  Currently stable.  He follows with pulmonology, Dr. Melvyn Novas.   On oxygen at 2 L/min.   Continue home medications.  Hyperlipidemia:  Continue statin  Hypertension:  Continue to monitor and optimize.    Solitary left pulmonary nodule on  CT:  Being followed by pulmonology.  Last CT scan on November 2019 showed 1.3 cm left lower lobe lung nodule which has remained unchanged from 01/2018. He  Is a past smoker.  Morbid obesity:  Healthy diet, and exercise when feasible.    DVT prophylaxis: ScD Code Status: Full Family Communication: Wife. Disposition Plan: This will depend on hospital course.  Consultants: Neurosurgery, IR, cardiology, pulmonary Procedures: MRI  Antimicrobials:  Anti-infectives (From admission, onward)   Start     Dose/Rate Route Frequency Ordered Stop   10/28/18 0700  ceFAZolin (ANCEF) 3 g in dextrose 5 % 50 mL IVPB     3 g 100 mL/hr over 30 Minutes Intravenous To Surgery 10/27/18 1105 10/29/18 0700      Subjective: Reports back pain.    Objective: Vitals:   10/26/18 1941 10/27/18 0536 10/27/18 0834 10/27/18 1252  BP: (!) 156/73 (!) 167/68 (!) 144/60 (!) 138/46  Pulse: (!) 51 60 65 63  Resp: 18 18  19   Temp: 97.7 F (36.5 C)   98 F (36.7 C)  TempSrc: Oral     SpO2: 96% 97%  94%  Weight:      Height:        Intake/Output Summary (Last 24 hours) at 10/27/2018 1725 Last data filed at 10/27/2018 1656 Gross per 24 hour  Intake  720 ml  Output -  Net 720 ml   Filed Weights   10/23/18 0525  Weight: 134.7 kg    Examination:  General exam: Morbidly obese.  Not in any painful distress.    HEENT:PERRL,Oral mucosa moist, Ear/Nose normal on gross exam Respiratory system: Clear to auscultation. Cardiovascular system: S1 & S2 heard.   Gastrointestinal system: Abdomen is morbidly obese, soft and nontender.  Organs are difficult to assess.  Central nervous system: Alert and oriented. No focal neurological deficits. Extremities: No  edema, no clubbing ,no cyanosis, distal peripheral pulses palpable.  Data Reviewed: I have personally reviewed following labs and imaging studies  CBC: Recent Labs  Lab 10/21/18 1154 10/27/18 0918  WBC 10.7* 11.7*  NEUTROABS 7.6 9.9*  HGB 16.4 16.7  HCT 50.5 50.4  MCV 90.2 89.2  PLT 189 419*   Basic Metabolic Panel: Recent Labs  Lab 10/21/18 1154 10/27/18 0428  NA 142 141  K 3.5 5.1  CL 103 107  CO2 25 20*  GLUCOSE 149* 225*  BUN 14 38*  CREATININE 1.20 1.27*  CALCIUM 9.4 8.6*   GFR: Estimated Creatinine Clearance: 75.8 mL/min (A) (by C-G formula based on SCr of 1.27 mg/dL (H)). Liver Function Tests: No results for input(s): AST, ALT, ALKPHOS, BILITOT, PROT, ALBUMIN in the last 168 hours. No results for input(s): LIPASE, AMYLASE in the last 168 hours. No results for input(s): AMMONIA in the last 168 hours. Coagulation Profile: No results for input(s): INR, PROTIME in the last 168 hours. Cardiac Enzymes: No results for input(s): CKTOTAL, CKMB, CKMBINDEX, TROPONINI in the last 168 hours. BNP (last 3 results) Recent Labs    11/19/17 1556  PROBNP 79.0   HbA1C: No results for input(s): HGBA1C in the last 72 hours. CBG: Recent Labs  Lab 10/26/18 2114 10/27/18 0425 10/27/18 0640 10/27/18 1129 10/27/18 1623  GLUCAP 212* 193* 191* 188* 200*   Lipid Profile: No results for input(s): CHOL, HDL, LDLCALC, TRIG, CHOLHDL, LDLDIRECT in the last 72 hours. Thyroid Function Tests: No results for input(s): TSH, T4TOTAL, FREET4, T3FREE, THYROIDAB in the last 72 hours. Anemia Panel: No results for input(s): VITAMINB12, FOLATE, FERRITIN, TIBC, IRON, RETICCTPCT in the last 72 hours. Sepsis Labs: No results for input(s): PROCALCITON, LATICACIDVEN in the last 168 hours.  No results found for this or any previous visit (from the past 240 hour(s)).       Radiology Studies: No results found. Scheduled Meds: . acetaminophen  650 mg Oral Q6H  . amLODipine  5 mg Oral QHS   . dexamethasone  4 mg Intravenous Q8H  . gabapentin  300 mg Oral TID  . insulin aspart  0-15 Units Subcutaneous TID WC  . irbesartan  300 mg Oral Daily  . metoprolol succinate  25 mg Oral Daily  . pantoprazole  40 mg Oral Daily  . rosuvastatin  10 mg Oral QHS  . sodium chloride flush  3 mL Intravenous Q12H  . tamsulosin  0.4 mg Oral QHS   Continuous Infusions: . sodium chloride    . [START ON 10/28/2018]  ceFAZolin (ANCEF) IV       LOS: 5 days    Time spent: 25 mins.   Bonnell Public, MD [facility] Pager [phone] If 7PM-7AM, please contact night-coverage [email or web address] Password [facility] 10/27/2018, 5:25 PM

## 2018-10-27 NOTE — Progress Notes (Signed)
  Echocardiogram 2D Echocardiogram has been performed.  Alfred Carney 10/27/2018, 2:47 PM

## 2018-10-27 NOTE — Anesthesia Preprocedure Evaluation (Addendum)
Anesthesia Evaluation  Patient identified by MRN, date of birth, ID band Patient awake    Reviewed: Allergy & Precautions, NPO status , Patient's Chart, lab work & pertinent test results  Airway Mallampati: III  TM Distance: <3 FB Neck ROM: Full    Dental  (+) Dental Advisory Given, Edentulous Upper, Edentulous Lower   Pulmonary shortness of breath, sleep apnea , COPD,  oxygen dependent, former smoker,    breath sounds clear to auscultation       Cardiovascular hypertension, Pt. on medications and Pt. on home beta blockers + CAD and + Peripheral Vascular Disease   Rhythm:Regular Rate:Normal  Normal systolic function. Valves okay   Neuro/Psych negative neurological ROS     GI/Hepatic Neg liver ROS, hiatal hernia, GERD  ,  Endo/Other  diabetes, Type 2, Oral Hypoglycemic Agents  Renal/GU negative Renal ROS     Musculoskeletal   Abdominal   Peds  Hematology negative hematology ROS (+)   Anesthesia Other Findings   Reproductive/Obstetrics                          Lab Results  Component Value Date   WBC 11.7 (H) 10/27/2018   HGB 16.7 10/27/2018   HCT 50.4 10/27/2018   MCV 89.2 10/27/2018   PLT 127 (L) 10/27/2018   Lab Results  Component Value Date   CREATININE 1.27 (H) 10/27/2018   BUN 38 (H) 10/27/2018   NA 141 10/27/2018   K 5.1 10/27/2018   CL 107 10/27/2018   CO2 20 (L) 10/27/2018    Anesthesia Physical Anesthesia Plan  ASA: IV  Anesthesia Plan: General   Post-op Pain Management:    Induction: Intravenous  PONV Risk Score and Plan: 2 and Dexamethasone, Ondansetron and Treatment may vary due to age or medical condition  Airway Management Planned: Oral ETT  Additional Equipment:   Intra-op Plan:   Post-operative Plan: Extubation in OR and Possible Post-op intubation/ventilation  Informed Consent: I have reviewed the patients History and Physical, chart, labs and  discussed the procedure including the risks, benefits and alternatives for the proposed anesthesia with the patient or authorized representative who has indicated his/her understanding and acceptance.     Dental advisory given  Plan Discussed with: CRNA  Anesthesia Plan Comments: (CPAP in PACU)       Anesthesia Quick Evaluation

## 2018-10-27 NOTE — Progress Notes (Signed)
Vitals:   10/26/18 1318 10/26/18 1941 10/27/18 0536 10/27/18 0834  BP: 135/67 (!) 156/73 (!) 167/68 (!) 144/60  Pulse: (!) 52 (!) 51 60 65  Resp: 17 18 18    Temp: (!) 97.5 F (36.4 C) 97.7 F (36.5 C)    TempSrc: Oral Oral    SpO2: 93% 96% 97%   Weight:      Height:        BMET Recent Labs    10/27/18 0428  NA 141  K 5.1  CL 107  CO2 20*  GLUCOSE 225*  BUN 38*  CREATININE 1.27*  CALCIUM 8.6*    Patient continues to have disabling left lumbar radicular pain, from the left superior buttock into the anterior left thigh.  He feels that if anything the pain is continued to worsen.  His ability to change position in bed and transfer to standing to ambulate to the bathroom has become more limited.  The patient has been seen in both pulmonary and cardiology consultation, and I appreciate their input.  I discussed both of their assessments with the patient.  He understands that he is at greater risk perioperatively for pulmonary complications in particular, including the possibility of extended intubation and ventilator support, with ICU care.  Understanding these risks he does want to proceed with surgery, which is scheduled for tomorrow morning.  I likewise appreciate Dr. Kyra Searles assistance.  Plan: For left L3-4 extraforaminal microdiscectomy in a.m.  We again reviewed risks and answered the patient's questions, he does want to proceed with surgery.  We will decrease Decadron to 4 mg IV every 8 hours.  Hosie Spangle, MD 10/27/2018, 8:42 AM

## 2018-10-27 NOTE — Progress Notes (Signed)
Physical Therapy Treatment Patient Details Name: Alfred Carney MRN: [id number] DOB: Apr 11, 1951 Today's Date: 10/27/2018    History of Present Illness Alfred Carney is a 68 yo male who presented to the ED with approximately 1 week history of pain which originates over his left buttock area and lower back. Significant PMH of L THA in 2014, chronic respiratory failure, morbid obesity, type 2 diabetes.    PT Comments    Patient is able to ambulate 200 ft with RW and overall requires supervision for safety with mobility this session. Continue to progress as tolerated.    Follow Up Recommendations  Home health PT     Equipment Recommendations  None recommended by PT    Recommendations for Other Services       Precautions / Restrictions Precautions Precautions: Fall    Mobility  Bed Mobility Overal bed mobility: Modified Independent Bed Mobility: Sit to Supine;Supine to Sit           General bed mobility comments: cues for sequencing/technique however pt agitated and sat up EOB very quickly  Transfers Overall transfer level: Needs assistance Equipment used: Rolling walker (2 wheeled) Transfers: Sit to/from Stand Sit to Stand: Supervision         General transfer comment: pt stood impulsively and began ambulating toward doorway   Ambulation/Gait Ambulation/Gait assistance: Supervision Gait Distance (Feet): 200 Feet Assistive device: Rolling walker (2 wheeled) Gait Pattern/deviations: Step-through pattern;Decreased stride length;Wide base of support Gait velocity: decreased   General Gait Details: pt took O2 off and declined waiting for therapist to get O2 tank for ambulation and began ambulating toward doorway; cues for posture    Stairs             Wheelchair Mobility    Modified Rankin (Stroke Patients Only)       Balance Overall balance assessment: Mild deficits observed, not formally tested                                           Cognition Arousal/Alertness: Awake/alert Behavior During Therapy: Agitated Overall Cognitive Status: Within Functional Limits for tasks assessed                                 General Comments: pt upset that so many people were coming in and out of his room      Exercises      General Comments General comments (skin integrity, edema, etc.): SpO2 desat to 84% on RA and 2L O2 via Naomi reapplied end of session; pt with DOE upon return to room; wife present in room      Pertinent Vitals/Pain Pain Assessment: Faces Faces Pain Scale: Hurts a little bit Pain Location: back and L LE Pain Descriptors / Indicators: Radiating;Constant Pain Intervention(s): Limited activity within patient's tolerance;Monitored during session;Premedicated before session;Repositioned    Home Living                      Prior Function            PT Goals (current goals can now be found in the care plan section) Progress towards PT goals: Progressing toward goals    Frequency    Min 3X/week      PT Plan Current plan remains appropriate    Co-evaluation  AM-PAC PT "6 Clicks" Mobility   Outcome Measure  Help needed turning from your back to your side while in a flat bed without using bedrails?: None Help needed moving from lying on your back to sitting on the side of a flat bed without using bedrails?: A Little Help needed moving to and from a bed to a chair (including a wheelchair)?: A Little Help needed standing up from a chair using your arms (e.g., wheelchair or bedside chair)?: A Little Help needed to walk in hospital room?: A Little Help needed climbing 3-5 steps with a railing? : A Little 6 Click Score: 19    End of Session Equipment Utilized During Treatment: Gait belt Activity Tolerance: Patient limited by pain Patient left: in bed;with call bell/phone within reach;with family/visitor present Nurse Communication: Mobility status PT Visit  Diagnosis: Pain;Difficulty in walking, not elsewhere classified (R26.2) Pain - Right/Left: Left Pain - part of body: Hip;Leg     Time: 1449-1501 PT Time Calculation (min) (ACUTE ONLY): 12 min  Charges:  $Gait Training: 8-22 mins                     Earney Navy, PTA Acute Rehabilitation Services Pager: [phone] Office: [phone]     Darliss Cheney 10/27/2018, 3:19 PM

## 2018-10-27 NOTE — Progress Notes (Signed)
Inpatient Diabetes Program Recommendations  AACE/ADA: New Consensus Statement on Inpatient Glycemic Control (2015)  Target Ranges:  Prepandial:   less than 140 mg/dL      Peak postprandial:   less than 180 mg/dL (1-2 hours)      Critically ill patients:  140 - 180 mg/dL   Lab Results  Component Value Date   GLUCAP 188 (H) 10/27/2018   HGBA1C 6.6 (H) 05/08/2014    Review of Glycemic Control Results for Alfred Carney, Alfred Carney (MRN [id number]) as of 10/27/2018 11:56  Ref. Range 10/26/2018 21:14 10/27/2018 04:25 10/27/2018 06:40 10/27/2018 11:29  Glucose-Capillary Latest Ref Range: 70 - 99 mg/dL 212 (H) 193 (H) 191 (H) 188 (H)   Diabetes history: Type 2 DM Outpatient Diabetes medications: none Current orders for Inpatient glycemic control: Novolog 0-15 units TID  Inpatient Diabetes Program Recommendations:    Per Care everywhere, last A1C was 6.8% as of 07/2018. Could consider repeating while inpatient, as it has been right at 3 months?  In preparation for surgery and in the setting of steroids, would recommend adding Lantus 8 units QHS.   Thanks, Bronson Curb, MSN, RNC-OB Diabetes Coordinator [phone] (8a-5p)

## 2018-10-28 ENCOUNTER — Inpatient Hospital Stay (HOSPITAL_COMMUNITY): Payer: Medicare HMO | Admitting: Anesthesiology

## 2018-10-28 ENCOUNTER — Encounter (HOSPITAL_COMMUNITY)
Admission: EM | Disposition: A | Discharge status: Home Health | Payer: Self-pay | Source: Home / Self Care | Attending: Internal Medicine

## 2018-10-28 ENCOUNTER — Inpatient Hospital Stay (HOSPITAL_COMMUNITY): Payer: Medicare HMO

## 2018-10-28 HISTORY — PX: LUMBAR LAMINECTOMY/DECOMPRESSION MICRODISCECTOMY: SHX5026

## 2018-10-28 LAB — GLUCOSE, CAPILLARY
Glucose-Capillary: 197 mg/dL — ABNORMAL HIGH (ref 70–99)
Glucose-Capillary: 239 mg/dL — ABNORMAL HIGH (ref 70–99)
Glucose-Capillary: 203 mg/dL — ABNORMAL HIGH (ref 70–99)
Glucose-Capillary: 251 mg/dL — ABNORMAL HIGH (ref 70–99)

## 2018-10-28 SURGERY — LUMBAR LAMINECTOMY/DECOMPRESSION MICRODISCECTOMY 1 LEVEL
Anesthesia: General | Site: Spine Lumbar | Laterality: Left

## 2018-10-28 MED ORDER — HYDROCODONE-ACETAMINOPHEN 5-325 MG PO TABS
1.0000 | ORAL_TABLET | ORAL | Status: DC | PRN
  Administered 2018-10-29 – 2018-10-31 (×4): 2 via ORAL
  Filled 2018-10-28 (×6): qty 2

## 2018-10-28 MED ORDER — ACETAMINOPHEN 10 MG/ML IV SOLN
INTRAVENOUS | Status: DC | PRN
  Administered 2018-10-28: 1000 mg via INTRAVENOUS

## 2018-10-28 MED ORDER — LIDOCAINE-EPINEPHRINE 1 %-1:100000 IJ SOLN
INTRAMUSCULAR | Status: DC | PRN
  Administered 2018-10-28: 20 mL

## 2018-10-28 MED ORDER — LIDOCAINE-EPINEPHRINE 1 %-1:100000 IJ SOLN
INTRAMUSCULAR | Status: AC
  Filled 2018-10-28: qty 1

## 2018-10-28 MED ORDER — OXYCODONE HCL 5 MG PO TABS
ORAL_TABLET | ORAL | Status: AC
  Filled 2018-10-28: qty 1

## 2018-10-28 MED ORDER — CEFAZOLIN SODIUM-DEXTROSE 2-4 GM/100ML-% IV SOLN
INTRAVENOUS | Status: AC
  Filled 2018-10-28: qty 100

## 2018-10-28 MED ORDER — ROCURONIUM BROMIDE 50 MG/5ML IV SOSY
PREFILLED_SYRINGE | INTRAVENOUS | Status: AC
  Filled 2018-10-28: qty 5

## 2018-10-28 MED ORDER — ARFORMOTEROL TARTRATE 15 MCG/2ML IN NEBU
15.0000 ug | INHALATION_SOLUTION | Freq: Two times a day (BID) | RESPIRATORY_TRACT | Status: DC
  Administered 2018-10-28 – 2018-10-31 (×5): 15 ug via RESPIRATORY_TRACT
  Filled 2018-10-28 (×6): qty 2

## 2018-10-28 MED ORDER — ONDANSETRON HCL 4 MG/2ML IJ SOLN: 4.0000 mg | Freq: Once | INTRAMUSCULAR | Status: DC | PRN

## 2018-10-28 MED ORDER — MIDAZOLAM HCL 2 MG/2ML IJ SOLN
INTRAMUSCULAR | Status: AC
  Filled 2018-10-28: qty 2

## 2018-10-28 MED ORDER — BUDESONIDE 0.25 MG/2ML IN SUSP
0.2500 mg | Freq: Two times a day (BID) | RESPIRATORY_TRACT | Status: DC
  Administered 2018-10-28 – 2018-10-31 (×5): 0.25 mg via RESPIRATORY_TRACT
  Filled 2018-10-28 (×7): qty 2

## 2018-10-28 MED ORDER — SUCCINYLCHOLINE CHLORIDE 200 MG/10ML IV SOSY
PREFILLED_SYRINGE | INTRAVENOUS | Status: AC
  Filled 2018-10-28: qty 10

## 2018-10-28 MED ORDER — METHYLPREDNISOLONE ACETATE 80 MG/ML IJ SUSP
INTRAMUSCULAR | Status: DC | PRN
  Administered 2018-10-28: 80 mg

## 2018-10-28 MED ORDER — ACETAMINOPHEN 10 MG/ML IV SOLN: 1000.0000 mg | Freq: Once | INTRAVENOUS | Status: DC

## 2018-10-28 MED ORDER — BUPIVACAINE HCL (PF) 0.5 % IJ SOLN
INTRAMUSCULAR | Status: DC | PRN
  Administered 2018-10-28: 20 mL

## 2018-10-28 MED ORDER — MIDAZOLAM HCL 5 MG/5ML IJ SOLN
INTRAMUSCULAR | Status: DC | PRN
  Administered 2018-10-28: 2 mg via INTRAVENOUS

## 2018-10-28 MED ORDER — FENTANYL CITRATE (PF) 100 MCG/2ML IJ SOLN: 25.0000 ug | INTRAMUSCULAR | Status: DC | PRN

## 2018-10-28 MED ORDER — SUCCINYLCHOLINE CHLORIDE 20 MG/ML IJ SOLN
INTRAMUSCULAR | Status: DC | PRN
  Administered 2018-10-28: 120 mg via INTRAVENOUS

## 2018-10-28 MED ORDER — SODIUM CHLORIDE 0.9 % IV SOLN
INTRAVENOUS | Status: DC | PRN
  Administered 2018-10-28: 50 ug/min via INTRAVENOUS

## 2018-10-28 MED ORDER — PROPOFOL 10 MG/ML IV BOLUS
INTRAVENOUS | Status: DC | PRN
  Administered 2018-10-28: 20 mg via INTRAVENOUS
  Administered 2018-10-28: 200 mg via INTRAVENOUS

## 2018-10-28 MED ORDER — SUGAMMADEX SODIUM 200 MG/2ML IV SOLN
INTRAVENOUS | Status: DC | PRN
  Administered 2018-10-28: 400 mg via INTRAVENOUS

## 2018-10-28 MED ORDER — DEXAMETHASONE SODIUM PHOSPHATE 10 MG/ML IJ SOLN: INTRAMUSCULAR | Status: DC | PRN

## 2018-10-28 MED ORDER — ARTIFICIAL TEARS OPHTHALMIC OINT
TOPICAL_OINTMENT | OPHTHALMIC | Status: DC | PRN
  Administered 2018-10-28: 1 via OPHTHALMIC

## 2018-10-28 MED ORDER — BUPIVACAINE HCL (PF) 0.5 % IJ SOLN
INTRAMUSCULAR | Status: AC
  Filled 2018-10-28: qty 30

## 2018-10-28 MED ORDER — HEMOSTATIC AGENTS (NO CHARGE) OPTIME
TOPICAL | Status: DC | PRN
  Administered 2018-10-28: 1 via TOPICAL

## 2018-10-28 MED ORDER — ROCURONIUM BROMIDE 50 MG/5ML IV SOSY
PREFILLED_SYRINGE | INTRAVENOUS | Status: DC | PRN
  Administered 2018-10-28: 50 mg via INTRAVENOUS
  Administered 2018-10-28: 10 mg via INTRAVENOUS

## 2018-10-28 MED ORDER — EPHEDRINE SULFATE 50 MG/ML IJ SOLN
INTRAMUSCULAR | Status: DC | PRN
  Administered 2018-10-28 (×3): 10 mg via INTRAVENOUS

## 2018-10-28 MED ORDER — SENNOSIDES-DOCUSATE SODIUM 8.6-50 MG PO TABS
2.0000 | ORAL_TABLET | Freq: Two times a day (BID) | ORAL | Status: DC
  Administered 2018-10-28 – 2018-10-31 (×6): 2 via ORAL
  Filled 2018-10-28 (×7): qty 2

## 2018-10-28 MED ORDER — LIDOCAINE 2% (20 MG/ML) 5 ML SYRINGE
INTRAMUSCULAR | Status: AC
  Filled 2018-10-28: qty 5

## 2018-10-28 MED ORDER — METHYLPREDNISOLONE ACETATE 80 MG/ML IJ SUSP
INTRAMUSCULAR | Status: AC
  Filled 2018-10-28: qty 1

## 2018-10-28 MED ORDER — FENTANYL CITRATE (PF) 100 MCG/2ML IJ SOLN
INTRAMUSCULAR | Status: DC | PRN
  Administered 2018-10-28: 100 ug via INTRAVENOUS

## 2018-10-28 MED ORDER — THROMBIN 5000 UNITS EX SOLR
CUTANEOUS | Status: AC
  Filled 2018-10-28: qty 15000

## 2018-10-28 MED ORDER — THROMBIN 5000 UNITS EX SOLR
OROMUCOSAL | Status: DC | PRN
  Administered 2018-10-28: 5 mL via TOPICAL

## 2018-10-28 MED ORDER — FENTANYL CITRATE (PF) 100 MCG/2ML IJ SOLN
INTRAMUSCULAR | Status: DC | PRN
  Administered 2018-10-28: 25 ug via INTRAVENOUS
  Administered 2018-10-28: 50 ug via INTRAVENOUS
  Administered 2018-10-28: 100 ug via INTRAVENOUS
  Administered 2018-10-28: 25 ug via INTRAVENOUS

## 2018-10-28 MED ORDER — FENTANYL CITRATE (PF) 100 MCG/2ML IJ SOLN
INTRAMUSCULAR | Status: AC
  Filled 2018-10-28: qty 2

## 2018-10-28 MED ORDER — FENTANYL CITRATE (PF) 250 MCG/5ML IJ SOLN
INTRAMUSCULAR | Status: AC
  Filled 2018-10-28: qty 5

## 2018-10-28 MED ORDER — 0.9 % SODIUM CHLORIDE (POUR BTL) OPTIME
TOPICAL | Status: DC | PRN
  Administered 2018-10-28: 1000 mL

## 2018-10-28 MED ORDER — THROMBIN 5000 UNITS EX SOLR
CUTANEOUS | Status: DC | PRN
  Administered 2018-10-28 (×2): 5000 [IU] via TOPICAL

## 2018-10-28 MED ORDER — SODIUM CHLORIDE 0.9 % IV SOLN
INTRAVENOUS | Status: DC | PRN
  Administered 2018-10-28: 500 mL

## 2018-10-28 MED ORDER — CEFAZOLIN SODIUM-DEXTROSE 1-4 GM/50ML-% IV SOLN
INTRAVENOUS | Status: AC
  Filled 2018-10-28: qty 50

## 2018-10-28 MED ORDER — ACETAMINOPHEN 10 MG/ML IV SOLN
INTRAVENOUS | Status: AC
  Filled 2018-10-28: qty 100

## 2018-10-28 MED ORDER — ALBUMIN HUMAN 5 % IV SOLN
INTRAVENOUS | Status: DC | PRN
  Administered 2018-10-28: 10:00:00 via INTRAVENOUS

## 2018-10-28 MED ORDER — LACTATED RINGERS IV SOLN
INTRAVENOUS | Status: DC | PRN
  Administered 2018-10-28: 08:00:00 via INTRAVENOUS

## 2018-10-28 SURGICAL SUPPLY — 69 items
ADH SKN CLS APL DERMABOND .7 (GAUZE/BANDAGES/DRESSINGS) ×2
APL SKNCLS STERI-STRIP NONHPOA (GAUZE/BANDAGES/DRESSINGS)
BAG DECANTER FOR FLEXI CONT (MISCELLANEOUS) ×2 IMPLANT
BENZOIN TINCTURE PRP APPL 2/3 (GAUZE/BANDAGES/DRESSINGS) IMPLANT
BLADE CLIPPER SURG (BLADE) ×1 IMPLANT
BUR ACRON 5.0MM COATED (BURR) ×1 IMPLANT
BUR MATCHSTICK NEURO 3.0 LAGG (BURR) ×2 IMPLANT
CANISTER SUCT 3000ML PPV (MISCELLANEOUS) ×2 IMPLANT
CARTRIDGE OIL MAESTRO DRILL (MISCELLANEOUS) ×1 IMPLANT
COVER WAND RF STERILE (DRAPES) ×2 IMPLANT
DECANTER SPIKE VIAL GLASS SM (MISCELLANEOUS) ×2 IMPLANT
DERMABOND ADVANCED (GAUZE/BANDAGES/DRESSINGS) ×2
DERMABOND ADVANCED .7 DNX12 (GAUZE/BANDAGES/DRESSINGS) ×1 IMPLANT
DIFFUSER DRILL AIR PNEUMATIC (MISCELLANEOUS) ×2 IMPLANT
DRAPE HALF SHEET 40X57 (DRAPES) ×2 IMPLANT
DRAPE LAPAROTOMY 100X72X124 (DRAPES) ×2 IMPLANT
DRAPE MICROSCOPE LEICA (MISCELLANEOUS) ×2 IMPLANT
DRAPE POUCH INSTRU U-SHP 10X18 (DRAPES) ×2 IMPLANT
ELECT BLADE 4.0 EZ CLEAN MEGAD (MISCELLANEOUS) ×2
ELECT REM PT RETURN 9FT ADLT (ELECTROSURGICAL) ×2
ELECTRODE BLDE 4.0 EZ CLN MEGD (MISCELLANEOUS) IMPLANT
ELECTRODE REM PT RTRN 9FT ADLT (ELECTROSURGICAL) ×1 IMPLANT
GAUZE 4X4 16PLY RFD (DISPOSABLE) IMPLANT
GAUZE SPONGE 4X4 12PLY STRL (GAUZE/BANDAGES/DRESSINGS) IMPLANT
GAUZE SPONGE 4X4 12PLY STRL LF (GAUZE/BANDAGES/DRESSINGS) ×1 IMPLANT
GLOVE BIO SURGEON STRL SZ 6.5 (GLOVE) ×3 IMPLANT
GLOVE BIO SURGEON STRL SZ8 (GLOVE) ×1 IMPLANT
GLOVE BIOGEL PI IND STRL 6.5 (GLOVE) IMPLANT
GLOVE BIOGEL PI IND STRL 7.0 (GLOVE) IMPLANT
GLOVE BIOGEL PI IND STRL 8 (GLOVE) ×1 IMPLANT
GLOVE BIOGEL PI INDICATOR 6.5 (GLOVE) ×2
GLOVE BIOGEL PI INDICATOR 7.0 (GLOVE) ×1
GLOVE BIOGEL PI INDICATOR 8 (GLOVE) ×1
GLOVE ECLIPSE 7.5 STRL STRAW (GLOVE) ×2 IMPLANT
GLOVE EXAM NITRILE XL STR (GLOVE) IMPLANT
GLOVE INDICATOR 8.5 STRL (GLOVE) ×1 IMPLANT
GOWN STRL REUS W/ TWL LRG LVL3 (GOWN DISPOSABLE) IMPLANT
GOWN STRL REUS W/ TWL XL LVL3 (GOWN DISPOSABLE) ×1 IMPLANT
GOWN STRL REUS W/TWL 2XL LVL3 (GOWN DISPOSABLE) IMPLANT
GOWN STRL REUS W/TWL LRG LVL3 (GOWN DISPOSABLE) ×4
GOWN STRL REUS W/TWL XL LVL3 (GOWN DISPOSABLE) ×4
HEMOSTAT POWDER KIT SURGIFOAM (HEMOSTASIS) ×1 IMPLANT
KIT BASIN OR (CUSTOM PROCEDURE TRAY) ×2 IMPLANT
KIT TURNOVER KIT B (KITS) ×2 IMPLANT
NDL HYPO 18GX1.5 BLUNT FILL (NEEDLE) IMPLANT
NDL SPNL 18GX3.5 QUINCKE PK (NEEDLE) ×1 IMPLANT
NDL SPNL 22GX3.5 QUINCKE BK (NEEDLE) ×1 IMPLANT
NEEDLE HYPO 18GX1.5 BLUNT FILL (NEEDLE) ×2 IMPLANT
NEEDLE SPNL 18GX3.5 QUINCKE PK (NEEDLE) ×2 IMPLANT
NEEDLE SPNL 22GX3.5 QUINCKE BK (NEEDLE) ×4 IMPLANT
NS IRRIG 1000ML POUR BTL (IV SOLUTION) ×2 IMPLANT
OIL CARTRIDGE MAESTRO DRILL (MISCELLANEOUS) ×2
PACK LAMINECTOMY NEURO (CUSTOM PROCEDURE TRAY) ×2 IMPLANT
PAD ARMBOARD 7.5X6 YLW CONV (MISCELLANEOUS) ×6 IMPLANT
PATTIES SURGICAL .5 X1 (DISPOSABLE) ×1 IMPLANT
RUBBERBAND STERILE (MISCELLANEOUS) ×4 IMPLANT
SPONGE LAP 4X18 RFD (DISPOSABLE) IMPLANT
SPONGE SURGIFOAM ABS GEL SZ50 (HEMOSTASIS) ×2 IMPLANT
STRIP CLOSURE SKIN 1/2X4 (GAUZE/BANDAGES/DRESSINGS) ×1 IMPLANT
SUT PROLENE 6 0 BV (SUTURE) IMPLANT
SUT VIC AB 1 CT1 18XBRD ANBCTR (SUTURE) ×1 IMPLANT
SUT VIC AB 1 CT1 8-18 (SUTURE) ×4
SUT VIC AB 2-0 CP2 18 (SUTURE) ×3 IMPLANT
SUT VIC AB 3-0 SH 8-18 (SUTURE) IMPLANT
SYR 5ML LL (SYRINGE) ×1 IMPLANT
TAPE CLOTH SURG 4X10 WHT LF (GAUZE/BANDAGES/DRESSINGS) ×1 IMPLANT
TOWEL GREEN STERILE (TOWEL DISPOSABLE) ×2 IMPLANT
TOWEL GREEN STERILE FF (TOWEL DISPOSABLE) ×2 IMPLANT
WATER STERILE IRR 1000ML POUR (IV SOLUTION) ×2 IMPLANT

## 2018-10-28 NOTE — Anesthesia Procedure Notes (Signed)
Procedure Name: Intubation Date/Time: 10/28/2018 8:44 AM Performed by: Lavell Luster, CRNA Pre-anesthesia Checklist: Patient identified, Emergency Drugs available, Suction available, Patient being monitored and Timeout performed Patient Re-evaluated:Patient Re-evaluated prior to induction Oxygen Delivery Method: Circle system utilized Preoxygenation: Pre-oxygenation with 100% oxygen Induction Type: IV induction Ventilation: Mask ventilation with difficulty, Two handed mask ventilation required and Oral airway inserted - appropriate to patient size Laryngoscope Size: Mac, 4 and Glidescope Grade View: Grade I Tube type: Oral Tube size: 7.5 mm Number of attempts: 1 Airway Equipment and Method: Stylet and Video-laryngoscopy Placement Confirmation: ETT inserted through vocal cords under direct vision,  positive ETCO2 and breath sounds checked- equal and bilateral Secured at: 22 cm Tube secured with: Tape Dental Injury: Teeth and Oropharynx as per pre-operative assessment  Difficulty Due To: Difficulty was anticipated, Difficult Airway- due to limited oral opening, Difficult Airway-  due to edematous airway and Difficult Airway- due to reduced neck mobility Future Recommendations: Recommend- induction with short-acting agent, and alternative techniques readily available

## 2018-10-28 NOTE — Anesthesia Postprocedure Evaluation (Signed)
Anesthesia Post Note  Patient: Alfred Carney  Procedure(s) Performed: Left Lumbar three-four Extraforaminal microdiscectomy (Left Spine Lumbar)     Patient location during evaluation: PACU Anesthesia Type: General Level of consciousness: awake and alert Pain management: pain level controlled Vital Signs Assessment: post-procedure vital signs reviewed and stable Respiratory status: spontaneous breathing, nonlabored ventilation, respiratory function stable and patient connected to nasal cannula oxygen Cardiovascular status: blood pressure returned to baseline and stable Postop Assessment: no apparent nausea or vomiting Anesthetic complications: no    Last Vitals:  Vitals:   10/28/18 1215 10/28/18 1419  BP: 129/62 (!) 120/58  Pulse: 77 69  Resp: 14 16  Temp: 36.5 C 36.9 C  SpO2: 94% 94%    Last Pain:  Vitals:   10/28/18 1419  TempSrc: Oral  PainSc:                  [name]

## 2018-10-28 NOTE — Progress Notes (Signed)
Patient transferred back to room, receiving RN at bedside, patients bed in lowest position, call bell in reach, family notified of patients return, RT to bring CPAP to bedside.  Rowe Pavy, RN

## 2018-10-28 NOTE — Transfer of Care (Signed)
Immediate Anesthesia Transfer of Care Note  Patient: Alfred Carney  Procedure(s) Performed: Left Lumbar three-four Extraforaminal microdiscectomy (Left Spine Lumbar)  Patient Location: PACU  Anesthesia Type:General  Level of Consciousness: awake, alert  and oriented  Airway & Oxygen Therapy: Patient connected to face mask oxygen  Post-op Assessment: Post -op Vital signs reviewed and stable  Post vital signs: stable  Last Vitals:  Vitals Value Taken Time  BP    Temp    Pulse    Resp    SpO2      Last Pain:  Vitals:   10/28/18 0450  TempSrc: Oral  PainSc:       Patients Stated Pain Goal: 3 (31/67/42 5525)  Complications: No apparent anesthesia complications

## 2018-10-28 NOTE — Progress Notes (Addendum)
PROGRESS NOTE    Alfred Carney  NGE:[id number] DOB: [birth year] DOA: 10/21/2018 PCP: Lesleigh Noe, MD   Brief Narrative: Patient is a 68 year old male with past medical history of left total hip replacement, COPD, chronic respiratory failure on 2 L of oxygen at home, sleep apnea but not tolerating CPAP, multivessel coronary artery disease who presented to the emergency department with complaints of left-sided hip and back pain.  He was having shooting pain on his left upper thigh.  MRI of the lumbar spine showed L3-L4 left foraminal disc protrusion impinging left L3 nerve root, left-sided foraminal stenosis, surrounding edema, lumbar spondylosis with L4-L5 severe right foraminal stenosis.  Orthopedics and neurosurgery consulted.  Patient underwent blocking of the left L3 nerve root, but has continued to report significant pain.  Patient had discussed with the neurosurgeon that he would prefer the disc problem and radiculopathy managed medically, however, due to persistent back pain, patient has opted for surgical approach.  Patient went for left lumbar 3 4 extraforaminal microdiscectomy with microdissection, microsurgical technique and with operating microscope by Dr. Sherwood Gambler 10/28/2018 .  Subjective: Left lower extremity pain(was seen and examined before surgery)  Assessment & Plan:   Principal Problem:   Left hip pain Active Problems:   CAD in native artery   Hyperlipidemia   Essential hypertension   COPD GOLD II   Morbid obesity due to excess calories (HCC)   Chronic respiratory failure with hypoxia (HCC)   Iron deficiency anemia due to chronic blood loss   Solitary pulmonary nodule on lung CT   Back pain   Left lower extremity lumbar radicular pain -RI as above, initially tried with conservative management by neurosurgery, has undergone blocking of left L3 nerve root, continues to have significant pain. -Neurosurgery input greatly appreciated, was seen by both pulmonary and  cardiology consultation, he understands he is a greater risk perioperatively for pulmonary complication in particular, wants to proceed with surgery. -Patient went for L3-4 extraforaminal microdiscectomy -management and recommendation per neurosurgery - Cardiology and pulmonary teams have been consulted to optimize patient prior to surgery.  COPD:  -He is on 2 L/min nasal cannula at baseline, follow with pulmonary Dr. Melvyn Novas as an outpatient, will encourage to use incentive spirometry perioperatively. -New with Brovana and budsonide at night perioperatively  Hyperlipidemia:  Continue statin  Hypertension:  Continue to monitor and optimize.    Solitary left pulmonary nodule on  CT:  Being followed by pulmonology.  Last CT scan on November 2019 showed 1.3 cm left lower lobe lung nodule which has remained unchanged from 01/2018. He  Is a past smoker.  Morbid obesity:  Healthy diet, and exercise when feasible.    DVT prophylaxis: ScD Code Status: Full Family Communication: Wife. Disposition Plan: This will depend on hospital course.  Consultants: Neurosurgery, IR, cardiology, pulmonary Procedures: MRI  Antimicrobials:  Anti-infectives (From admission, onward)   Start     Dose/Rate Route Frequency Ordered Stop   10/28/18 0931  bacitracin 50,000 Units in sodium chloride 0.9 % 500 mL irrigation  Status:  Discontinued       As needed 10/28/18 0931 10/28/18 1127   10/28/18 0824  ceFAZolin (ANCEF) 1-4 GM/50ML-% IVPB    Note to Pharmacy:  Theodoro Grist   : cabinet override      10/28/18 0824 10/28/18 2029   10/28/18 0824  ceFAZolin (ANCEF) 2-4 GM/100ML-% IVPB    Note to Pharmacy:  Theodoro Grist   : cabinet override      10/28/18 [id number]  10/28/18 2029   10/28/18 0700  ceFAZolin (ANCEF) 3 g in dextrose 5 % 50 mL IVPB     3 g 100 mL/hr over 30 Minutes Intravenous To Surgery 10/27/18 1105 10/28/18 0930        Objective: Vitals:   10/27/18 1252 10/27/18 2103 10/28/18 0450 10/28/18 1135    BP: (!) 138/46 (!) 163/48 (!) 165/64 (!) 152/79  Pulse: 63 (!) 53 (!) 55 90  Resp: 19 18 17 16   Temp: 98 F (36.7 C)  98.1 F (36.7 C) 97.7 F (36.5 C)  TempSrc:   Oral   SpO2: 94% 91% 98% 97%  Weight:      Height:        Intake/Output Summary (Last 24 hours) at 10/28/2018 1158 Last data filed at 10/28/2018 1053 Gross per 24 hour  Intake 1310 ml  Output 150 ml  Net 1160 ml   Filed Weights   10/23/18 0525  Weight: 134.7 kg    Examination:  Awake Alert, Oriented X 3, No new F.N deficits, Normal affect Symmetrical Chest wall movement, Good air movement bilaterally, CTAB RRR,No Gallops,Rubs or new Murmurs, No Parasternal Heave +ve B.Sounds, Abd Soft, No tenderness, No rebound - guarding or rigidity. No Cyanosis, Clubbing or edema, No new Rash or bruise     Data Reviewed: I have personally reviewed following labs and imaging studies  CBC: Recent Labs  Lab 10/27/18 0918  WBC 11.7*  NEUTROABS 9.9*  HGB 16.7  HCT 50.4  MCV 89.2  PLT 656*   Basic Metabolic Panel: Recent Labs  Lab 10/27/18 0428  NA 141  K 5.1  CL 107  CO2 20*  GLUCOSE 225*  BUN 38*  CREATININE 1.27*  CALCIUM 8.6*   GFR: Estimated Creatinine Clearance: 75.8 mL/min (A) (by C-G formula based on SCr of 1.27 mg/dL (H)). Liver Function Tests: No results for input(s): AST, ALT, ALKPHOS, BILITOT, PROT, ALBUMIN in the last 168 hours. No results for input(s): LIPASE, AMYLASE in the last 168 hours. No results for input(s): AMMONIA in the last 168 hours. Coagulation Profile: No results for input(s): INR, PROTIME in the last 168 hours. Cardiac Enzymes: No results for input(s): CKTOTAL, CKMB, CKMBINDEX, TROPONINI in the last 168 hours. BNP (last 3 results) Recent Labs    11/19/17 1556  PROBNP 79.0   HbA1C: No results for input(s): HGBA1C in the last 72 hours. CBG: Recent Labs  Lab 10/27/18 1129 10/27/18 1623 10/27/18 2119 10/28/18 0641 10/28/18 1135  GLUCAP 188* 200* 226* 197* 239*    Lipid Profile: No results for input(s): CHOL, HDL, LDLCALC, TRIG, CHOLHDL, LDLDIRECT in the last 72 hours. Thyroid Function Tests: No results for input(s): TSH, T4TOTAL, FREET4, T3FREE, THYROIDAB in the last 72 hours. Anemia Panel: No results for input(s): VITAMINB12, FOLATE, FERRITIN, TIBC, IRON, RETICCTPCT in the last 72 hours. Sepsis Labs: No results for input(s): PROCALCITON, LATICACIDVEN in the last 168 hours.  No results found for this or any previous visit (from the past 240 hour(s)).       Radiology Studies: No results found. Scheduled Meds: . [MAR Hold] acetaminophen  650 mg Oral Q6H  . [MAR Hold] amLODipine  5 mg Oral QHS  . Chlorhexidine Gluconate Cloth  6 each Topical Once  . [MAR Hold] dexamethasone  4 mg Intravenous Q8H  . [MAR Hold] gabapentin  300 mg Oral TID  . [MAR Hold] insulin aspart  0-15 Units Subcutaneous TID WC  . [MAR Hold] irbesartan  300 mg Oral Daily  . [facility]  Hold] metoprolol succinate  25 mg Oral Daily  . [MAR Hold] pantoprazole  40 mg Oral Daily  . [MAR Hold] rosuvastatin  10 mg Oral QHS  . [MAR Hold] sodium chloride flush  3 mL Intravenous Q12H  . [MAR Hold] tamsulosin  0.4 mg Oral QHS   Continuous Infusions: . [MAR Hold] sodium chloride    . acetaminophen    . ceFAZolin    . ceFAZolin       LOS: 6 days   Phillips Climes, MD [facility] Pager [phone] If 7PM-7AM, please contact night-coverage [email or web address] Password [facility] 10/28/2018, 11:58 AM

## 2018-10-28 NOTE — Progress Notes (Signed)
   10/28/18 1000  OT Visit Information  Last OT Received On 10/28/18  Reason Eval/Treat Not Completed Patient at procedure or test/ unavailable;Other (comment) (surgery)  History of Present Illness Alfred Carney is a 68 yo male who presented to the ED with approximately 1 week history of pain which originates over his left buttock area and lower back. Significant PMH of L THA in 2014, chronic respiratory failure, morbid obesity, type 2 diabetes.    Tyrone Schimke, OT Acute Rehabilitation Services Pager: [phone] Office: [phone]

## 2018-10-28 NOTE — Progress Notes (Signed)
Subjective: Patient standing up next to bed, with the staff supporting him, voiding well into a urinal.  He notes excellent relief of disabling radicular pain, but explains that he has numbness through the left lower extremity.  Objective: Vital signs in last 24 hours: Vitals:   10/28/18 1145 10/28/18 1200 10/28/18 1215 10/28/18 1419  BP: 134/65 133/68 129/62 (!) 120/58  Pulse: 79 80 77 69  Resp: 10 18 14 16   Temp:   97.7 F (36.5 C) 98.5 F (36.9 C)  TempSrc:    Oral  SpO2: 97% 97% 94% 94%  Weight:      Height:        Intake/Output from previous day: 02/11 0701 - 02/12 0700 In: 720 [P.O.:720] Out: -  Intake/Output this shift: Total I/O In: 1170 [P.O.:220; I.V.:700; IV Piggyback:250] Out: 150 [Blood:150]  Physical Exam: Dressing is clean and dry.  Moving all 4 extremities well.  Mobility in bed much improved.  CBC Recent Labs    10/27/18 0918  WBC 11.7*  HGB 16.7  HCT 50.4  PLT 127*   BMET Recent Labs    10/27/18 0428  NA 141  K 5.1  CL 107  CO2 20*  GLUCOSE 225*  BUN 38*  CREATININE 1.27*  CALCIUM 8.6*    Studies/Results: Dg Lumbar Spine 2-3 Views  Result Date: 10/28/2018 CLINICAL DATA:  Localization films in OR 21 EXAM: LUMBAR SPINE - 2-3 VIEW COMPARISON:  Lumbar MRI 10/21/2018 FINDINGS: Two lateral intraoperative effusion provided. Using the numbering convention of comparison MRI the first film demonstrates sharp tip probes at the L2-L3 and L3-L4 disc levels. Second film demonstrates skin spreaders posterior to the L3-L4 disc level as well as a curved tip probe at this level. IMPRESSION: Two intraoperative views as described above. Electronically Signed   By: Suzy Bouchard M.D.   On: 10/28/2018 16:05    Assessment/Plan: Spoke with the patient and his nurse regarding ambulating in the halls this evening with the rolling walker and the staff's assistance.  I also spoke with them about using hydrocodone, rather than oxycodone, now that his pain is much  improved.  I have written for the IV to be changed to a saline lock.  PT and OT are to be resumed tomorrow (order written).  Ordered O2 via nasal cannula as needed to maintain O2 sat >89%.  A.m. labs have been requested by tried hospitalist service.  If he continues to do well with good pain relief tomorrow, we will be able to continue to taper his Decadron.  Hosie Spangle, MD 10/28/2018, 6:02 PM

## 2018-10-28 NOTE — Op Note (Signed)
10/28/2018  11:25 AM  PATIENT:  Alfred Carney  68 y.o. male  PRE-OPERATIVE DIAGNOSIS: Left L3-4 extraforaminal lumbar herniated nucleus pulposus, lumbar degenerative disease, lumbar spondylosis, left lumbar radiculopathy  POST-OPERATIVE DIAGNOSIS:  Left L3-4 extraforaminal lumbar herniated nucleus pulposus, lumbar degenerative disease, lumbar spondylosis, left lumbar radiculopathy  PROCEDURE:  Procedure(s):  Left Lumbar three-four Extraforaminal microdiscectomy with microdissection, microsurgical technique, and the operating microscope  SURGEON: Jovita Gamma, MD  ASSISTANTS: Kary Kos, MD  ANESTHESIA:   general  EBL:  Total I/O In: 950 [I.V.:700; IV Piggyback:250] Out: 150 [Blood:150]  BLOOD ADMINISTERED:none  COUNT:  Correct per nursing staff  DICTATION: Patient was brought to the operating room and placed under general endotracheal anesthesia. Patient was turned to prone position the lumbar region was prepped with Betadine soap and solution and draped in a sterile fashion. The midline was infiltrated with local anesthetic with epinephrine. A localizing x-ray was taken and the L3-4 level was identified. Midline incision was made over the L3-4 level and was carried down through the subcutaneous tissue to the lumbar fascia. The lumbar fascia was incised on the left side and the paraspinal muscles were dissected from the spinous processes and lamina in a subperiosteal fashion. Another x-ray was taken and the L3-4 intertransverse space was identified. The operating microscope was draped and brought into the field provided additional magnification, illumination, and visualization. Dissection was begun in the intertransverse space. The left transverse process was identified, and the intertransverse ligament was carefully removed. A lateral facetectomy was performed using the high-speed drill and Kerrison punches. The exiting left L3 nerve root was identified. The disc herniation was identified  inferior medial and ventral to the nerve root.  The annulus was incised and we began to remove ligamentous disc herniation and intradiscal degenerated disc material.  As this was done we were able to progressively mobilize the subligamentous disc herniation, progressively decompressing the exiting left L3 nerve root.  In the end, all loose fragments of disc were removed from both the extraforaminal space and the lateral disc space, and good decompression of the exiting left L3 nerve root was achieved.  We confirmed good decompression of the ventral aspect of the nerve root.  Once the discectomy was completed and good decompression of the nerve had been achieved hemostasis was established with the use of bipolar cautery and Gelfoam with thrombin. The Gelfoam was removed, a thin layer Surgifoam applied, and hemostasis confirmed. We then instilled 2 cc of fentanyl and 80 mg of Depo-Medrol into the extraforaminal space. Deep fascia was closed with interrupted undyed 1 Vicryl sutures. Scarpa's fascia was closed with interrupted undyed 1 Vicryl sutures in the subcutaneous and subcuticular layer were closed with interrupted inverted 2-0 undyed Vicryl sutures. The skin edges were approximated with Dermabond.  A dressing of sterile gauze and Hypafix was applied.  Following surgery the patient was turned back to a supine position to be reversed from the anesthetic extubated and transferred to the recovery room for further care.  PLAN OF CARE: Patient is to be evaluated in PACU and subsequent disposition determined.  PATIENT DISPOSITION:  PACU - hemodynamically stable.   Delay start of Pharmacological VTE agent (>24hrs) due to surgical blood loss or risk of bleeding:  yes

## 2018-10-29 ENCOUNTER — Encounter (HOSPITAL_COMMUNITY): Payer: Self-pay | Admitting: Neurosurgery

## 2018-10-29 LAB — BASIC METABOLIC PANEL
Anion gap: 6 (ref 5–15)
BUN: 29 mg/dL — ABNORMAL HIGH (ref 8–23)
CO2: 28 mmol/L (ref 22–32)
Calcium: 8.3 mg/dL — ABNORMAL LOW (ref 8.9–10.3)
Chloride: 106 mmol/L (ref 98–111)
Creatinine, Ser: 1.24 mg/dL (ref 0.61–1.24)
GFR calc Af Amer: >60 mL/min (ref >60)
GFR calc non Af Amer: 59 mL/min — ABNORMAL LOW (ref >60)
Glucose, Bld: 249 mg/dL — ABNORMAL HIGH (ref 70–99)
Potassium: 5.5 mmol/L — ABNORMAL HIGH (ref 3.5–5.1)
Sodium: 140 mmol/L (ref 135–145)

## 2018-10-29 LAB — GLUCOSE, CAPILLARY
Glucose-Capillary: 249 mg/dL — ABNORMAL HIGH (ref 70–99)
Glucose-Capillary: 202 mg/dL — ABNORMAL HIGH (ref 70–99)
Glucose-Capillary: 227 mg/dL — ABNORMAL HIGH (ref 70–99)
Glucose-Capillary: 164 mg/dL — ABNORMAL HIGH (ref 70–99)

## 2018-10-29 MED ORDER — DEXAMETHASONE SODIUM PHOSPHATE 4 MG/ML IJ SOLN
2.0000 mg | Freq: Two times a day (BID) | INTRAMUSCULAR | Status: AC
  Administered 2018-10-30 (×2): 2 mg via INTRAVENOUS
  Filled 2018-10-29 (×3): qty 0.5

## 2018-10-29 MED ORDER — OXYCODONE HCL 5 MG PO TABS
5.0000 mg | ORAL_TABLET | ORAL | Status: DC | PRN
  Administered 2018-10-30 (×2): 10 mg via ORAL
  Filled 2018-10-29 (×2): qty 2

## 2018-10-29 MED ORDER — INSULIN GLARGINE 100 UNIT/ML ~~LOC~~ SOLN
6.0000 [IU] | Freq: Every day | SUBCUTANEOUS | Status: DC
  Administered 2018-10-29 – 2018-10-31 (×3): 6 [IU] via SUBCUTANEOUS
  Filled 2018-10-29 (×3): qty 0.06

## 2018-10-29 MED ORDER — DEXAMETHASONE 4 MG PO TABS: 2.0000 mg | ORAL_TABLET | ORAL | Status: DC

## 2018-10-29 NOTE — Progress Notes (Signed)
[facility] will sign off.   Medication Recommendations:  n/a Other recommendations (labs, testing, etc):  N/a Follow up as an outpatient:  As scheduled  Mr. Shillingburg is stable from a cardiac stand point post surgery.  Please feel free to re-consult [name] if needed.  Tiara Bartoli C. Oval Linsey, MD, [facility] 10/29/2018 11:16 AM

## 2018-10-29 NOTE — Progress Notes (Signed)
Vitals:   10/28/18 2150 10/29/18 0200 10/29/18 0539 10/29/18 0829  BP: (!) 144/57 (!) 142/69 (!) 152/67   Pulse: 65 64 60   Resp: 18 20 18    Temp: 98.3 F (36.8 C) (!) 97.2 F (36.2 C) (!) 97.5 F (36.4 C)   TempSrc: Oral Oral Oral   SpO2: 96% 98% 97% 98%  Weight:      Height:        CBC Recent Labs    10/27/18 0918  WBC 11.7*  HGB 16.7  HCT 50.4  PLT 127*   BMET Recent Labs    10/27/18 0428 10/29/18 0308  NA 141 140  K 5.1 5.5*  CL 107 106  CO2 20* 28  GLUCOSE 225* 249*  BUN 38* 29*  CREATININE 1.27* 1.24  CALCIUM 8.6* 8.3*    Patient resting in bed, ambulated in the halls last night and again this morning (this morning he walked a short on the floor).  Mild discomfort in low back and adjacent to incision, continued numbness in the left lower extremity, but excellent relief of disabling radicular pain.  PT and OT to resume this morning.  Continuing gradual Decadron taper (orders placed).  Will have nursing staff DC dressing tomorrow, and leave open to air, unless there is any oozing.  Since surgery is only required 2 tablets of hydrocodone at 0600 this morning.  Plan: Continue to progress through postoperative recovery, encouraging ambulation 4-6 times per day in the halls.  We will need guidance from PT and OT whether patient will benefit from CIR at the [facility] hospital inpatient rehabilitation unit, or rehab at a SNF, or will be able to return home with home health PT to progress to outpatient PT.  The patient is certainly significantly deconditioned, and I have discussed extensively with him the importance of walking at least 4-6 times per day to build up stamina and endurance, which I explained will gradually developed over a period of months.  Similarly the numbness in the left lower extremity will gradually improve over the coming months.  Overall he is doing well.  I will be off duty until March 9.  Dr. Kary Kos will be covering from a neurosurgical  perspective until I return.  The patient will need to return for an outpatient follow-up visit with me in the office in mid March.  He or his wife will need to call for that appointment (they are to speak to my secretary, Junie Panning, at [phone], [phone]), no x-rays are needed for that visit.  He should shower daily, once the dressing is removed tomorrow.  The wound should not need be covered for showers.  The Dermabond on the incision needs to be removed by the end of February.  Hosie Spangle, MD 10/29/2018, 9:05 AM

## 2018-10-29 NOTE — Progress Notes (Signed)
Physical Therapy Treatment Patient Details Name: Alfred Carney MRN: [id number] DOB: [birth year] Today's Date: 10/29/2018    History of Present Illness Alfred Carney is a 68 yo male who presented to the ED with approximately 1 week history of pain which originates over his left buttock area and lower back. Significant PMH of L THA in 2014, chronic respiratory failure, morbid obesity, type 2 diabetes.    PT Comments    Patient seen for mobility progression. Pt tolerated gait and stair training well with c/o pain at incision and numbness in L thigh. Continue to progress as tolerated.   Follow Up Recommendations  Home health PT     Equipment Recommendations  None recommended by PT    Recommendations for Other Services       Precautions / Restrictions Precautions Precautions: Fall Precaution Comments: reviewed back precautions with pt Restrictions Weight Bearing Restrictions: No    Mobility  Bed Mobility Overal bed mobility: Modified Independent Bed Mobility: Sidelying to Sit;Rolling;Sit to Sidelying Rolling: Supervision Sidelying to sit: Supervision     Sit to sidelying: Supervision General bed mobility comments: use of rail  Transfers Overall transfer level: Needs assistance Equipment used: Rolling walker (2 wheeled) Transfers: Sit to/from Stand Sit to Stand: Supervision         General transfer comment: cues for maintaining back precautions   Ambulation/Gait Ambulation/Gait assistance: Supervision Gait Distance (Feet): 150 Feet Assistive device: Rolling walker (2 wheeled) Gait Pattern/deviations: Step-through pattern;Decreased stride length;Wide base of support     General Gait Details: cues for posture   Stairs Stairs: Yes Stairs assistance: Min guard Stair Management: One rail Left;Step to pattern;Forwards Number of Stairs: 3 General stair comments: cues for sequencing and technique   Wheelchair Mobility    Modified Rankin (Stroke Patients Only)        Balance Overall balance assessment: Mild deficits observed, not formally tested                                          Cognition Arousal/Alertness: Awake/alert Behavior During Therapy: WFL for tasks assessed/performed Overall Cognitive Status: Within Functional Limits for tasks assessed                                        Exercises      General Comments        Pertinent Vitals/Pain Pain Assessment: Faces Pain Score: 3  Faces Pain Scale: Hurts a little bit Pain Location: back and L LE Pain Descriptors / Indicators: Sore Pain Intervention(s): Limited activity within patient's tolerance;Monitored during session;Premedicated before session;Repositioned    Home Living                      Prior Function            PT Goals (current goals can now be found in the care plan section) Acute Rehab PT Goals Patient Stated Goal: stop hurting Progress towards PT goals: Progressing toward goals    Frequency    Min 3X/week      PT Plan Current plan remains appropriate    Co-evaluation              AM-PAC PT "6 Clicks" Mobility   Outcome Measure  Help needed turning from your back to your side while in  a flat bed without using bedrails?: None Help needed moving from lying on your back to sitting on the side of a flat bed without using bedrails?: A Little Help needed moving to and from a bed to a chair (including a wheelchair)?: A Little Help needed standing up from a chair using your arms (e.g., wheelchair or bedside chair)?: A Little Help needed to walk in hospital room?: A Little Help needed climbing 3-5 steps with a railing? : A Little 6 Click Score: 19    End of Session Equipment Utilized During Treatment: Gait belt; 2L O2 via Morton Activity Tolerance: Patient tolerated treatment well Patient left: in bed;with call bell/phone within reach;with family/visitor present Nurse Communication: Mobility status PT  Visit Diagnosis: Pain;Difficulty in walking, not elsewhere classified (R26.2) Pain - Right/Left: Left Pain - part of body: Hip;Leg     Time: 1219-7588 PT Time Calculation (min) (ACUTE ONLY): 15 min  Charges:  $Gait Training: 8-22 mins                     Earney Navy, PTA Acute Rehabilitation Services Pager: [phone] Office: [phone]     Darliss Cheney 10/29/2018, 4:33 PM

## 2018-10-29 NOTE — Progress Notes (Signed)
Occupational Therapy Treatment Patient Details Name: Alfred Carney MRN: [id number] DOB: 24-May-1951 Today's Date: 10/29/2018    History of present illness Alfred Carney is a 68 yo male who presented to the ED with approximately 1 week history of pain which originates over his left buttock area and lower back. Significant PMH of L THA in 2014, chronic respiratory failure, morbid obesity, type 2 diabetes.   OT comments  Pt making good progress with functional goals. Cues for safety/bacl precations, pt attempting to twist to turn to pick up cup from table. OT will continue to follow acutely  Follow Up Recommendations  No OT follow up;Supervision - Intermittent    Equipment Recommendations  None recommended by OT    Recommendations for Other Services      Precautions / Restrictions Precautions Precautions: Fall Precaution Comments: reviewed back precautions with pt Restrictions Weight Bearing Restrictions: No       Mobility Bed Mobility Overal bed mobility: Modified Independent Bed Mobility: Sidelying to Sit;Rolling;Sit to Sidelying Rolling: Supervision Sidelying to sit: Supervision     Sit to sidelying: Supervision General bed mobility comments: cues for back precautions  Transfers Overall transfer level: Needs assistance Equipment used: Rolling walker (2 wheeled) Transfers: Sit to/from Stand Sit to Stand: Supervision         General transfer comment: cues for safety, pt attempting to twist to turn to pick up cup from table    Balance Overall balance assessment: Mild deficits observed, not formally tested                                         ADL either performed or assessed with clinical judgement   ADL Overall ADL's : Needs assistance/impaired     Grooming: Wash/dry hands;Wash/dry face;Supervision/safety;Standing       Lower Body Bathing: Minimal assistance;Cueing for back precautions       Lower Body Dressing: Sit to/from stand;Minimal  assistance;Cueing for back precautions   Toilet Transfer: Ambulation;RW;Grab bars;Supervision/safety   Toileting- Clothing Manipulation and Hygiene: Supervision/safety       Functional mobility during ADLs: Supervision/safety General ADL Comments: pt has A/E and DME at home, wife will ne able to assist 24/7     Vision Baseline Vision/History: Wears glasses Wears Glasses: Reading only Patient Visual Report: No change from baseline     Perception     Praxis      Cognition Arousal/Alertness: Awake/alert Behavior During Therapy: WFL for tasks assessed/performed Overall Cognitive Status: Within Functional Limits for tasks assessed                                          Exercises     Shoulder Instructions       General Comments      Pertinent Vitals/ Pain       Pain Assessment: 0-10 Pain Score: 3  Pain Location: back and L LE Pain Descriptors / Indicators: Sore Pain Intervention(s): Limited activity within patient's tolerance;Monitored during session;Repositioned;RN gave pain meds during session  Home Living                                          Prior Functioning/Environment  Frequency  Min 2X/week        Progress Toward Goals  OT Goals(current goals can now be found in the care plan section)  Progress towards OT goals: Progressing toward goals     Plan Discharge plan remains appropriate    Co-evaluation                 AM-PAC OT "6 Clicks" Daily Activity     Outcome Measure   Help from another person eating meals?: None Help from another person taking care of personal grooming?: None Help from another person toileting, which includes using toliet, bedpan, or urinal?: A Little Help from another person bathing (including washing, rinsing, drying)?: None Help from another person to put on and taking off regular upper body clothing?: None Help from another person to put on and taking off  regular lower body clothing?: A Little 6 Click Score: 22    End of Session Equipment Utilized During Treatment: Gait belt  OT Visit Diagnosis: Unsteadiness on feet (R26.81);Pain Pain - Right/Left: Left Pain - part of body: Leg(back)   Activity Tolerance Patient tolerated treatment well   Patient Left in bed;with call bell/phone within reach   Nurse Communication          Time: 6283-1517 OT Time Calculation (min): 24 min  Charges: OT General Charges $OT Visit: 1 Visit OT Treatments $Self Care/Home Management : 8-22 mins $Therapeutic Activity: 8-22 mins     Britt Bottom 10/29/2018, 12:45 PM

## 2018-10-29 NOTE — Progress Notes (Addendum)
PROGRESS NOTE    Alfred Carney  EUM:[id number] DOB: 1951/05/20 DOA: 10/21/2018 PCP: Lesleigh Noe, MD   Brief Narrative:  Patient is a 68 year old male with past medical history of left total hip replacement, COPD, chronic respiratory failure on 2 L of oxygen at home, sleep apnea but not tolerating CPAP, multivessel coronary artery disease who presented to the emergency department with complaints of left-sided hip and back pain.  He was having shooting pain on his left upper thigh.  MRI of the lumbar spine showed L3-L4 left foraminal disc protrusion impinging left L3 nerve root, left-sided foraminal stenosis, surrounding edema, lumbar spondylosis with L4-L5 severe right foraminal stenosis.  Orthopedics and neurosurgery consulted.  Patient underwent blocking of the left L3 nerve root, but has continued to report significant pain.  Patient had discussed with the neurosurgeon that he would prefer the disc problem and radiculopathy managed medically, however, due to persistent back pain, patient has opted for surgical approach.  Patient went for left lumbar 3 4 extraforaminal microdiscectomy with microdissection, microsurgical technique and with operating microscope by Dr. Sherwood Gambler 10/28/2018 .  Subjective: Patient reports his pain has improved, was able to ambulate in the hallway with PT yesterday  Assessment & Plan:   Principal Problem:   Left hip pain Active Problems:   CAD in native artery   Hyperlipidemia   Essential hypertension   COPD GOLD II   Morbid obesity due to excess calories (HCC)   Chronic respiratory failure with hypoxia (HCC)   Iron deficiency anemia due to chronic blood loss   Solitary pulmonary nodule on lung CT   Back pain   Left lower extremity lumbar radicular pain -RI as above, initially tried with conservative management by neurosurgery, has undergone blocking of left L3 nerve root, continues to have significant pain. -Neurosurgery input greatly appreciated, was seen  by both pulmonary and cardiology consultation, he understands he is a greater risk perioperatively for pulmonary complication in particular, wants to proceed with surgery. -Patient went for L3-4 extraforaminal microdiscectomy by Dr. Sherwood Gambler 10/28/2018 - Cardiology and pulmonary teams have been consulted to optimize patient prior to surgery. -Appears to be improving, was able to ambulate with PT yesterday, only minimal pain medicine requirement -Continue with Decadron taper per neurosurgery recommendation  COPD:  -He is on 2 L/min nasal cannula at baseline, follow with pulmonary Dr. Melvyn Novas as an outpatient, will encourage to use incentive spirometry perioperatively. -Started on Brovana and budsonide at night perioperatively  Hyperlipidemia:  Continue statin  Hypertension:  Continue to monitor and optimize.  I will hold her ARB given potassium of 5.5 today  Hyperglycemia -Most likely in the setting of neurology, which is being tapered currently, will start on low-dose Lantus, will check A1c  Solitary left pulmonary nodule on  CT:  - Being followed by pulmonology.  Last CT scan on November 2019 showed 1.3 cm left lower lobe lung nodule which has remained unchanged from 01/2018. He  Is a past smoker.  Morbid obesity:  Healthy diet, and exercise when feasible.    DVT prophylaxis: SCD , now ambulating with PT Code Status: Full Family Communication: none at bedside Disposition Plan: This will depend on hospital course.  Consultants: Neurosurgery, IR, cardiology, pulmonary Procedures:  left lumbar 3 4 extraforaminal microdiscectomy with microdissection, microsurgical technique and with operating microscope by Dr. Sherwood Gambler 10/28/2018 .  Antimicrobials:  Anti-infectives (From admission, onward)   Start     Dose/Rate Route Frequency Ordered Stop   10/28/18 0931  bacitracin 50,000 Units  in sodium chloride 0.9 % 500 mL irrigation  Status:  Discontinued       As needed 10/28/18 0931 10/28/18 1127     10/28/18 0824  ceFAZolin (ANCEF) 1-4 GM/50ML-% IVPB    Note to Pharmacy:  Theodoro Grist   : cabinet override      10/28/18 0824 10/28/18 1242   10/28/18 0824  ceFAZolin (ANCEF) 2-4 GM/100ML-% IVPB    Note to Pharmacy:  Theodoro Grist   : cabinet override      10/28/18 0824 10/28/18 1242   10/28/18 0700  ceFAZolin (ANCEF) 3 g in dextrose 5 % 50 mL IVPB     3 g 100 mL/hr over 30 Minutes Intravenous To Surgery 10/27/18 1105 10/28/18 0930        Objective: Vitals:   10/29/18 0200 10/29/18 0539 10/29/18 0829 10/29/18 1009  BP: (!) 142/69 (!) 152/67  120/76  Pulse: 64 60  78  Resp: 20 18    Temp: (!) 97.2 F (36.2 C) (!) 97.5 F (36.4 C)    TempSrc: Oral Oral    SpO2: 98% 97% 98%   Weight:      Height:        Intake/Output Summary (Last 24 hours) at 10/29/2018 1139 Last data filed at 10/29/2018 1013 Gross per 24 hour  Intake 703 ml  Output -  Net 703 ml   Filed Weights   10/23/18 0525  Weight: 134.7 kg    Examination:  Awake Alert, Oriented X 3, No new F.N deficits, Normal affect Symmetrical Chest wall movement, Good air movement bilaterally, CTAB RRR,No Gallops,Rubs or new Murmurs, No Parasternal Heave +ve B.Sounds, Abd Soft, No tenderness, No rebound - guarding or rigidity. No Cyanosis, Clubbing or edema, No new Rash or bruise surgical wound in the back area covered with bandage    Data Reviewed: I have personally reviewed following labs and imaging studies  CBC: Recent Labs  Lab 10/27/18 0918  WBC 11.7*  NEUTROABS 9.9*  HGB 16.7  HCT 50.4  MCV 89.2  PLT 384*   Basic Metabolic Panel: Recent Labs  Lab 10/27/18 0428 10/29/18 0308  NA 141 140  K 5.1 5.5*  CL 107 106  CO2 20* 28  GLUCOSE 225* 249*  BUN 38* 29*  CREATININE 1.27* 1.24  CALCIUM 8.6* 8.3*   GFR: Estimated Creatinine Clearance: 77.7 mL/min (by C-G formula based on SCr of 1.24 mg/dL). Liver Function Tests: No results for input(s): AST, ALT, ALKPHOS, BILITOT, PROT, ALBUMIN in the last  168 hours. No results for input(s): LIPASE, AMYLASE in the last 168 hours. No results for input(s): AMMONIA in the last 168 hours. Coagulation Profile: No results for input(s): INR, PROTIME in the last 168 hours. Cardiac Enzymes: No results for input(s): CKTOTAL, CKMB, CKMBINDEX, TROPONINI in the last 168 hours. BNP (last 3 results) Recent Labs    11/19/17 1556  PROBNP 79.0   HbA1C: No results for input(s): HGBA1C in the last 72 hours. CBG: Recent Labs  Lab 10/28/18 1135 10/28/18 1605 10/28/18 2216 10/29/18 0735 10/29/18 1121  GLUCAP 239* 203* 251* 249* 202*   Lipid Profile: No results for input(s): CHOL, HDL, LDLCALC, TRIG, CHOLHDL, LDLDIRECT in the last 72 hours. Thyroid Function Tests: No results for input(s): TSH, T4TOTAL, FREET4, T3FREE, THYROIDAB in the last 72 hours. Anemia Panel: No results for input(s): VITAMINB12, FOLATE, FERRITIN, TIBC, IRON, RETICCTPCT in the last 72 hours. Sepsis Labs: No results for input(s): PROCALCITON, LATICACIDVEN in the last 168 hours.  No results found  for this or any previous visit (from the past 240 hour(s)).       Radiology Studies: Dg Lumbar Spine 2-3 Views  Result Date: 10/28/2018 CLINICAL DATA:  Localization films in OR 21 EXAM: LUMBAR SPINE - 2-3 VIEW COMPARISON:  Lumbar MRI 10/21/2018 FINDINGS: Two lateral intraoperative effusion provided. Using the numbering convention of comparison MRI the first film demonstrates sharp tip probes at the L2-L3 and L3-L4 disc levels. Second film demonstrates skin spreaders posterior to the L3-L4 disc level as well as a curved tip probe at this level. IMPRESSION: Two intraoperative views as described above. Electronically Signed   By: Suzy Bouchard M.D.   On: 10/28/2018 16:05   Scheduled Meds: . acetaminophen  650 mg Oral Q6H  . amLODipine  5 mg Oral QHS  . arformoterol  15 mcg Nebulization BID  . budesonide (PULMICORT) nebulizer solution  0.25 mg Nebulization BID  . dexamethasone  2 mg  Intravenous Q12H  . [START ON 11/01/2018] dexamethasone  2 mg Oral QODAY  . gabapentin  300 mg Oral TID  . insulin aspart  0-15 Units Subcutaneous TID WC  . insulin glargine  6 Units Subcutaneous Daily  . metoprolol succinate  25 mg Oral Daily  . pantoprazole  40 mg Oral Daily  . rosuvastatin  10 mg Oral QHS  . senna-docusate  2 tablet Oral BID  . sodium chloride flush  3 mL Intravenous Q12H  . tamsulosin  0.4 mg Oral QHS   Continuous Infusions: . sodium chloride       LOS: 7 days   Phillips Climes, MD [facility] Pager [phone] If 7PM-7AM, please contact night-coverage [email or web address] Password [facility] 10/29/2018, 11:39 AM

## 2018-10-29 NOTE — Plan of Care (Signed)

## 2018-10-29 NOTE — Plan of Care (Signed)
  Problem: Education: Goal: Knowledge of General Education information will improve Description Including pain rating scale, medication(s)/side effects and non-pharmacologic comfort measures Outcome: Progressing   

## 2018-10-29 NOTE — Progress Notes (Signed)
Dressing to pt's back removed per Sherwood Gambler, MD order. No drainage at this time. Pt tolerated well. Will continue to monitor.

## 2018-10-30 DIAGNOSIS — E875 Hyperkalemia: Secondary | ICD-10-CM

## 2018-10-30 LAB — GLUCOSE, CAPILLARY
Glucose-Capillary: 174 mg/dL — ABNORMAL HIGH (ref 70–99)
Glucose-Capillary: 103 mg/dL — ABNORMAL HIGH (ref 70–99)
Glucose-Capillary: 191 mg/dL — ABNORMAL HIGH (ref 70–99)
Glucose-Capillary: 187 mg/dL — ABNORMAL HIGH (ref 70–99)

## 2018-10-30 LAB — BASIC METABOLIC PANEL
Anion gap: 4 — ABNORMAL LOW (ref 5–15)
Anion gap: 10 (ref 5–15)
BUN: 30 mg/dL — ABNORMAL HIGH (ref 8–23)
BUN: 37 mg/dL — ABNORMAL HIGH (ref 8–23)
CO2: 30 mmol/L (ref 22–32)
CO2: 29 mmol/L (ref 22–32)
Calcium: 8.4 mg/dL — ABNORMAL LOW (ref 8.9–10.3)
Calcium: 8.7 mg/dL — ABNORMAL LOW (ref 8.9–10.3)
Chloride: 104 mmol/L (ref 98–111)
Chloride: 102 mmol/L (ref 98–111)
Creatinine, Ser: 1.14 mg/dL (ref 0.61–1.24)
Creatinine, Ser: 1.53 mg/dL — ABNORMAL HIGH (ref 0.61–1.24)
GFR calc Af Amer: >60 mL/min (ref >60)
GFR calc Af Amer: 53 mL/min — ABNORMAL LOW (ref >60)
GFR calc non Af Amer: >60 mL/min (ref >60)
GFR calc non Af Amer: 46 mL/min — ABNORMAL LOW (ref >60)
Glucose, Bld: 188 mg/dL — ABNORMAL HIGH (ref 70–99)
Glucose, Bld: 160 mg/dL — ABNORMAL HIGH (ref 70–99)
Potassium: 5.8 mmol/L — ABNORMAL HIGH (ref 3.5–5.1)
Potassium: 5.7 mmol/L — ABNORMAL HIGH (ref 3.5–5.1)
Sodium: 138 mmol/L (ref 135–145)
Sodium: 141 mmol/L (ref 135–145)

## 2018-10-30 LAB — HEMOGLOBIN A1C
Hgb A1c MFr Bld: 7.3 % — ABNORMAL HIGH (ref 4.8–5.6)
Mean Plasma Glucose: 163 mg/dL

## 2018-10-30 MED ORDER — OXYCODONE HCL 5 MG PO TABS: 5.0000 mg | ORAL_TABLET | Freq: Four times a day (QID) | ORAL | 0 refills | Status: DC | PRN

## 2018-10-30 MED ORDER — SODIUM POLYSTYRENE SULFONATE 15 GM/60ML PO SUSP
60.0000 g | Freq: Once | ORAL | Status: AC
  Administered 2018-10-30: 60 g via ORAL
  Filled 2018-10-30: qty 240

## 2018-10-30 MED ORDER — DEXAMETHASONE 2 MG PO TABS: 2.0000 mg | ORAL_TABLET | ORAL | 0 refills | Status: DC

## 2018-10-30 MED ORDER — OSELTAMIVIR PHOSPHATE 75 MG PO CAPS: 75.0000 mg | ORAL_CAPSULE | Freq: Two times a day (BID) | ORAL | 0 refills | Status: DC

## 2018-10-30 MED ORDER — SENNOSIDES-DOCUSATE SODIUM 8.6-50 MG PO TABS: 2.0000 | ORAL_TABLET | Freq: Two times a day (BID) | ORAL | Status: DC

## 2018-10-30 MED ORDER — SODIUM CHLORIDE 0.9 % IV SOLN
INTRAVENOUS | Status: DC
  Administered 2018-10-30: 17:00:00 via INTRAVENOUS

## 2018-10-30 MED ORDER — ACETAMINOPHEN 325 MG PO TABS: 650.0000 mg | ORAL_TABLET | Freq: Four times a day (QID) | ORAL | Status: DC

## 2018-10-30 MED ORDER — SODIUM ZIRCONIUM CYCLOSILICATE 10 G PO PACK
10.0000 g | PACK | Freq: Two times a day (BID) | ORAL | Status: DC
  Administered 2018-10-31: 10 g via ORAL
  Filled 2018-10-30 (×3): qty 1

## 2018-10-30 MED ORDER — OSELTAMIVIR PHOSPHATE 75 MG PO CAPS
75.0000 mg | ORAL_CAPSULE | Freq: Two times a day (BID) | ORAL | Status: DC
  Administered 2018-10-30 – 2018-10-31 (×3): 75 mg via ORAL
  Filled 2018-10-30 (×3): qty 1

## 2018-10-30 NOTE — Care Management Note (Signed)
Case Management Note  Patient Details  Name: Alfred Carney MRN: [id number] Date of Birth: 1950-10-11  Subjective/Objective:  69 yr old gentleman s/p left L3-4 extraforaminal microdiscectomy.                  Action/Plan: Case manager spoke with patient and wife concerning discharge plan. Patient was scheduled to discharge today, Potassium level is too high. Choice for [facility] offered, Mrs. Hyams says they have used [facility] in the past and want to do so now. Referral called to Neoma Laming, [facility] Liaison. They have 3in1 and adaptive equipment at home.   Expected Discharge Date:   pending               Expected Discharge Plan:  [place]  In-House Referral:  NA  Discharge planning Services  CM Consult  Post Acute Care Choice:  Durable Medical Equipment, Home Health Choice offered to:  Patient, Spouse  DME Arranged:  [place] DME Agency:  [place]:  PT [place] Agency:  [place]  Status of Service:  Completed, signed off  If discussed at [place] of Stay Meetings, dates discussed:    Additional Comments:  Ninfa Meeker, RN 10/30/2018, 3:59 PM

## 2018-10-30 NOTE — Progress Notes (Addendum)
PROGRESS NOTE    Alfred Carney  [id number]:[id number] DOB: 23-May-1951 DOA: 10/21/2018 PCP: Lesleigh Noe, MD   Brief Narrative:  Patient is a 68 year old male with past medical history of left total hip replacement, COPD, chronic respiratory failure on 2 L of oxygen at home, sleep apnea but not tolerating CPAP, multivessel coronary artery disease who presented to the emergency department with complaints of left-sided hip and back pain.  He was having shooting pain on his left upper thigh.  MRI of the lumbar spine showed L3-L4 left foraminal disc protrusion impinging left L3 nerve root, left-sided foraminal stenosis, surrounding edema, lumbar spondylosis with L4-L5 severe right foraminal stenosis.  Orthopedics and neurosurgery consulted.  Patient underwent blocking of the left L3 nerve root, but has continued to report significant pain.  Patient had discussed with the neurosurgeon that he would prefer the disc problem and radiculopathy managed medically, however, due to persistent back pain, patient has opted for surgical approach.  Patient went for left lumbar 3 4 extraforaminal microdiscectomy with microdissection, microsurgical technique and with operating microscope by Dr. Sherwood Gambler 10/28/2018 .  Subjective: Patient reports his pain has improved, constipated earlier today, but resolved after Kayexalate  Assessment & Plan:   Principal Problem:   Left hip pain Active Problems:   CAD in native artery   Hyperlipidemia   Essential hypertension   COPD GOLD II   Morbid obesity due to excess calories (HCC)   Chronic respiratory failure with hypoxia (HCC)   Iron deficiency anemia due to chronic blood loss   Solitary pulmonary nodule on lung CT   Back pain   Left lower extremity lumbar radicular pain -RI as above, initially tried with conservative management by neurosurgery, has undergone blocking of left L3 nerve root, continues to have significant pain. -Neurosurgery input greatly appreciated, was  seen by both pulmonary and cardiology consultation, he understands he is a greater risk perioperatively for pulmonary complication in particular, wants to proceed with surgery. -Patient went for L3-4 extraforaminal microdiscectomy by Dr. Sherwood Gambler 10/28/2018 - Cardiology and pulmonary teams have been consulted to optimize patient prior to surgery. -Appears to be improving, was able to ambulate with PT yesterday, only minimal pain medicine requirement -Continue with Decadron taper per neurosurgery recommendation  COPD:  -He is on 2 L/min nasal cannula at baseline, follow with pulmonary Dr. Melvyn Novas as an outpatient, will encourage to use incentive spirometry perioperatively. -Started on Brovana and budsonide at night perioperatively  Hyperlipidemia:  Continue statin  Hypertension:  Avapro has been stopped given hyperkalemia, blood pressure remains controlled on Norvasc  Hyperkalemia -Avapro has been stopped, received Kayexalate, remains elevated, added on Lokelma   hyperglycemia -Once he came back elevated at 7.3, which is diagnostic of diabetes, he has been on steroids during hospital stay, has been instructed to follow diet compliance, if no improvement, then he will likely need medication  Solitary left pulmonary nodule on  CT:  - Being followed by pulmonology.  Last CT scan on November 2019 showed 1.3 cm left lower lobe lung nodule which has remained unchanged from 01/2018. He  Is a past smoker. -He is following with Dr. Shyrl Numbers as an outpatient regarding this  Morbid obesity:  Healthy diet, and exercise when feasible.    One of the hospital staff will take care of the patient, did test positive for flu, so he will be started on Tamiflu 75 mg oral daily for 7 days  DVT prophylaxis: SCD , now ambulating with PT Code Status: Full Family Communication:  none at bedside Disposition Plan: This will depend on hospital course.  Consultants: Neurosurgery, IR, cardiology, pulmonary Procedures:   left lumbar 3 4 extraforaminal microdiscectomy with microdissection, microsurgical technique and with operating microscope by Dr. Sherwood Gambler 10/28/2018 .  Antimicrobials:  Anti-infectives (From admission, onward)   Start     Dose/Rate Route Frequency Ordered Stop   10/31/18 0000  oseltamivir (TAMIFLU) 75 MG capsule     75 mg Oral 2 times daily 10/30/18 1508     10/30/18 1015  oseltamivir (TAMIFLU) capsule 75 mg     75 mg Oral 2 times daily 10/30/18 0946 11/04/18 0959   10/28/18 0931  bacitracin 50,000 Units in sodium chloride 0.9 % 500 mL irrigation  Status:  Discontinued       As needed 10/28/18 0931 10/28/18 1127   10/28/18 0824  ceFAZolin (ANCEF) 1-4 GM/50ML-% IVPB    Note to Pharmacy:  Theodoro Grist   : cabinet override      10/28/18 0824 10/28/18 1242   10/28/18 0824  ceFAZolin (ANCEF) 2-4 GM/100ML-% IVPB    Note to Pharmacy:  Theodoro Grist   : cabinet override      10/28/18 0824 10/28/18 1242   10/28/18 0700  ceFAZolin (ANCEF) 3 g in dextrose 5 % 50 mL IVPB     3 g 100 mL/hr over 30 Minutes Intravenous To Surgery 10/27/18 1105 10/28/18 0930        Objective: Vitals:   10/29/18 2019 10/29/18 2055 10/29/18 2356 10/30/18 1307  BP:  (!) 140/59 139/71 137/64  Pulse:  (!) 56 (!) 56 62  Resp:  18  16  Temp:  98 F (36.7 C)  98.4 F (36.9 C)  TempSrc:  Oral  Oral  SpO2: 95% 95%  94%  Weight:      Height:        Intake/Output Summary (Last 24 hours) at 10/30/2018 1639 Last data filed at 10/29/2018 1700 Gross per 24 hour  Intake 240 ml  Output -  Net 240 ml   Filed Weights   10/23/18 0525  Weight: 134.7 kg    Examination:  Awake Alert, Oriented X 3, No new F.N deficits, Normal affect Symmetrical Chest wall movement, Good air movement bilaterally, CTAB RRR,No Gallops,Rubs or new Murmurs, No Parasternal Heave +ve B.Sounds, Abd Soft, No tenderness, No rebound - guarding or rigidity. No Cyanosis, Clubbing or edema, No new Rash or bruise      Data Reviewed: I have  personally reviewed following labs and imaging studies  CBC: Recent Labs  Lab 10/27/18 0918  WBC 11.7*  NEUTROABS 9.9*  HGB 16.7  HCT 50.4  MCV 89.2  PLT 829*   Basic Metabolic Panel: Recent Labs  Lab 10/27/18 0428 10/29/18 0308 10/30/18 0336 10/30/18 1425  NA 141 140 138 141  K 5.1 5.5* 5.8* 5.7*  CL 107 106 104 102  CO2 20* 28 30 29   GLUCOSE 225* 249* 188* 160*  BUN 38* 29* 30* 37*  CREATININE 1.27* 1.24 1.14 1.53*  CALCIUM 8.6* 8.3* 8.4* 8.7*   GFR: Estimated Creatinine Clearance: 62.9 mL/min (A) (by C-G formula based on SCr of 1.53 mg/dL (H)). Liver Function Tests: No results for input(s): AST, ALT, ALKPHOS, BILITOT, PROT, ALBUMIN in the last 168 hours. No results for input(s): LIPASE, AMYLASE in the last 168 hours. No results for input(s): AMMONIA in the last 168 hours. Coagulation Profile: No results for input(s): INR, PROTIME in the last 168 hours. Cardiac Enzymes: No results for input(s): CKTOTAL, CKMB,  CKMBINDEX, TROPONINI in the last 168 hours. BNP (last 3 results) Recent Labs    11/19/17 1556  PROBNP 79.0   HbA1C: Recent Labs    10/29/18 1213  HGBA1C 7.3*   CBG: Recent Labs  Lab 10/29/18 1121 10/29/18 1540 10/29/18 2229 10/30/18 0604 10/30/18 1126  GLUCAP 202* 227* 164* 174* 103*   Lipid Profile: No results for input(s): CHOL, HDL, LDLCALC, TRIG, CHOLHDL, LDLDIRECT in the last 72 hours. Thyroid Function Tests: No results for input(s): TSH, T4TOTAL, FREET4, T3FREE, THYROIDAB in the last 72 hours. Anemia Panel: No results for input(s): VITAMINB12, FOLATE, FERRITIN, TIBC, IRON, RETICCTPCT in the last 72 hours. Sepsis Labs: No results for input(s): PROCALCITON, LATICACIDVEN in the last 168 hours.  No results found for this or any previous visit (from the past 240 hour(s)).       Radiology Studies: No results found. Scheduled Meds: . acetaminophen  650 mg Oral Q6H  . amLODipine  5 mg Oral QHS  . arformoterol  15 mcg Nebulization  BID  . budesonide (PULMICORT) nebulizer solution  0.25 mg Nebulization BID  . [START ON 11/01/2018] dexamethasone  2 mg Oral QODAY  . gabapentin  300 mg Oral TID  . insulin aspart  0-15 Units Subcutaneous TID WC  . insulin glargine  6 Units Subcutaneous Daily  . metoprolol succinate  25 mg Oral Daily  . oseltamivir  75 mg Oral BID  . pantoprazole  40 mg Oral Daily  . rosuvastatin  10 mg Oral QHS  . senna-docusate  2 tablet Oral BID  . sodium chloride flush  3 mL Intravenous Q12H  . sodium zirconium cyclosilicate  10 g Oral BID  . tamsulosin  0.4 mg Oral QHS   Continuous Infusions: . sodium chloride    . sodium chloride       LOS: 8 days   Phillips Climes, MD [facility] Pager [phone] If 7PM-7AM, please contact night-coverage [email or web address] Password [facility] 10/30/2018, 4:39 PM

## 2018-10-30 NOTE — Progress Notes (Signed)
Subjective: Patient reports some back soreness but doing well. No leg pain  Objective: Vital signs in last 24 hours: Temp:  [98 F (36.7 C)-98.2 F (36.8 C)] 98 F (36.7 C) (02/13 2055) Pulse Rate:  [56-61] 56 (02/13 2356) Resp:  [15-18] 18 (02/13 2055) BP: (128-140)/(59-75) 139/71 (02/13 2356) SpO2:  [94 %-95 %] 95 % (02/13 2055)  Intake/Output from previous day: 02/13 0701 - 02/14 0700 In: 723 [P.O.:720; I.V.:3] Out: -  Intake/Output this shift: No intake/output data recorded.  Neurologic: Grossly normal  Lab Results: Lab Results  Component Value Date   WBC 11.7 (H) 10/27/2018   HGB 16.7 10/27/2018   HCT 50.4 10/27/2018   MCV 89.2 10/27/2018   PLT 127 (L) 10/27/2018   Lab Results  Component Value Date   INR 1.07 01/28/2014   BMET Lab Results  Component Value Date   NA 138 10/30/2018   K 5.8 (H) 10/30/2018   CL 104 10/30/2018   CO2 30 10/30/2018   GLUCOSE 188 (H) 10/30/2018   BUN 30 (H) 10/30/2018   CREATININE 1.14 10/30/2018   CALCIUM 8.4 (L) 10/30/2018    Studies/Results: No results found.  Assessment/Plan: Doing well, continue therapies today.    LOS: 8 days    Ocie Cornfield Iridiana Fonner 10/30/2018, 10:10 AM

## 2018-10-30 NOTE — Discharge Instructions (Signed)
Follow with Primary MD Lesleigh Noe, MD in 7 days   Get CBC, CMP,  checked  by Primary MD next visit.    Activity: As tolerated with Full fall precautions use walker/cane & assistance as needed   Disposition Home    Diet: Heart Healthy ,CARB MODIFIED, with feeding assistance and aspiration precautions.    On your next visit with your primary care physician please Get Medicines reviewed and adjusted.   Please request your Prim.MD to go over all Hospital Tests and Procedure/Radiological results at the follow up, please get all Hospital records sent to your Prim MD by signing hospital release before you go home.   If you experience worsening of your admission symptoms, develop shortness of breath, life threatening emergency, suicidal or homicidal thoughts you must seek medical attention immediately by calling 911 or calling your MD immediately  if symptoms less severe.  You Must read complete instructions/literature along with all the possible adverse reactions/side effects for all the Medicines you take and that have been prescribed to you. Take any new Medicines after you have completely understood and accpet all the possible adverse reactions/side effects.   Do not drive, operating heavy machinery, perform activities at heights, swimming or participation in water activities or provide baby sitting services if your were admitted for syncope or siezures until you have seen by Primary MD or a Neurologist and advised to do so again.  Do not drive when taking Pain medications.    Do not take more than prescribed Pain, Sleep and Anxiety Medications  Special Instructions: If you have smoked or chewed Tobacco  in the last 2 yrs please stop smoking, stop any regular Alcohol  and or any Recreational drug use.  Wear Seat belts while driving.   Please note  You were cared for by a hospitalist during your hospital stay. If you have any questions about your discharge medications or the  care you received while you were in the hospital after you are discharged, you can call the unit and asked to speak with the hospitalist on call if the hospitalist that took care of you is not available. Once you are discharged, your primary care physician will handle any further medical issues. Please note that NO REFILLS for any discharge medications will be authorized once you are discharged, as it is imperative that you return to your primary care physician (or establish a relationship with a primary care physician if you do not have one) for your aftercare needs so that they can reassess your need for medications and monitor your lab values.

## 2018-10-30 NOTE — Progress Notes (Signed)
Pt stated that he has finally had results from [facility]. No acute distress, but states that his "belly is still rumbling, and churning." Lab ordered per MD request. Continue to monitor.

## 2018-10-30 NOTE — Progress Notes (Signed)
BMP shows that K+ is still too high to send pt home. MD aware and is entering orders. Pt in bathroom when I went in to let him know his discharge was cancelled. Explained to wife, verbalized understanding.

## 2018-10-30 NOTE — Discharge Summary (Addendum)
Alfred Carney, is a 68 y.o. male  DOB [birth year]  MRN [id number].  Admission date:  10/21/2018  Admitting Physician  Lady Deutscher, MD  Discharge Date:  10/31/2018   Primary MD  Lesleigh Noe, MD  Recommendations for primary care physician for things to follow:  - please check CBC, BMP during next visit -Patient to follow with orthopedic as an outpatient.   Admission Diagnosis  Pain of left hip joint [M25.552]   Discharge Diagnosis  Pain of left hip joint [M25.552]    Principal Problem:   Left hip pain Active Problems:   CAD in native artery   Hyperlipidemia   Essential hypertension   COPD GOLD II   Morbid obesity due to excess calories (HCC)   Chronic respiratory failure with hypoxia (HCC)   Iron deficiency anemia due to chronic blood loss   Solitary pulmonary nodule on lung CT   Back pain      Past Medical History:  Diagnosis Date  . Chronic obstructive pulmonary disease ([place]) 2008   Moderate  . Colon polyps   . Community acquired pneumonia   . Coronary artery disease   . Degenerative joint disease   . Diabetes mellitus without complication ([place])    type 2  . Emphysema lung ([place])   . Enlarged liver    fatty liver by '09 CT  . Gastroesophageal reflux disease   . Gout   . H/O hiatal hernia   . History of [place] spotted fever    Possible history of [place] Spotted Fever  . Hyperlipidemia   . Hypertension   . Hypokalemia    diuretic induced, resolved  . Microcytic anemia    iron pills  . Morbid obesity ([place])   . Shortness of breath   . Skin cancer    skin cancer lip removed  . Sleep apnea    not currently using CPAP 05/20/13  . 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 CARDIAC CATHETERIZATION  2007   Ejection fraction is estimated at 60%  . CHOLECYSTECTOMY    .  COLONOSCOPY WITH PROPOFOL N/A 01/22/2018   Procedure: COLONOSCOPY WITH PROPOFOL;  Surgeon: Mauri Pole, MD;  Location: [facility];  Service: Endoscopy;  Laterality: N/A;  . DG NERVE ROOT BLOCK LUMBAR-SACRAL EACH ADD. LEVEL  10/23/2018      . ESOPHAGOGASTRODUODENOSCOPY (EGD) WITH PROPOFOL N/A 01/22/2018   Procedure: ESOPHAGOGASTRODUODENOSCOPY (EGD) WITH PROPOFOL;  Surgeon: Mauri Pole, MD;  Location: [facility];  Service: Endoscopy;  Laterality: N/A;  . HARDWARE REMOVAL Left 05/26/2013   Procedure: HARDWARE REMOVAL;  Surgeon: Newt Minion, MD;  Location: [facility];  Service: Orthopedics;  Laterality: Left;  Left Total Hip Arthroplasty, Removal of Deep Hardware  . HIP SURGERY     Status post left hip surgery with bone grafting  . IR INJECT/THERA/INC NEEDLE/CATH/PLC EPI/LUMB/SAC W/IMG  10/23/2018  . LEFT HEART CATHETERIZATION WITH CORONARY ANGIOGRAM N/A 01/31/2014  Procedure: LEFT HEART CATHETERIZATION WITH CORONARY ANGIOGRAM;  Surgeon: Burnell Blanks, MD;  Location: [facility] CATH LAB;  Service: Cardiovascular;  Laterality: N/A;  . LUMBAR LAMINECTOMY/DECOMPRESSION MICRODISCECTOMY Left 10/28/2018   Procedure: Left Lumbar three-four Extraforaminal microdiscectomy;  Surgeon: Jovita Gamma, MD;  Location: [facility];  Service: Neurosurgery;  Laterality: Left;  . THORACOABDOMINAL AORTIC ANEURYSM REPAIR     with right femoral and left iliac BPG and reimplantation of renal arteries.  . TONSILLECTOMY    . TONSILLECTOMY    . TOTAL HIP ARTHROPLASTY Left 05/26/2013   Procedure: TOTAL HIP ARTHROPLASTY;  Surgeon: Newt Minion, MD;  Location: [facility];  Service: Orthopedics;  Laterality: Left;  Left Total Hip Arthroplasty, Removal Deep Hardware       History of present illness and  Hospital Course:     Kindly see H&P for history of present illness and admission details, please review complete Labs, Consult reports and Test reports for all details in brief  HPI  from the history and physical done on  the day of admission  10/21/2018  HPI: Alfred Carney is a 68 y.o. male with medical history significant of left total hip replacement by Dr. Sharol Given in 2013, COPD GOLD II, chronic respiratory failure sats 81% at rest and chronic stable state.  Patient uses 2 L of oxygen 24/7,, active sleep apnea but did not tolerate CPAP, falls asleep after eating, sleeps in a recliner for 10 years, with muscle multivessel coronary artery atherosclerosis calcifications noted on CT scan, small hiatal hernia, who presents today with complaints of left-sided hip and back pain.  His hip is going to give out and it causes him extreme pain.  About a week ago he was at his father's funeral and he had to walk uphill to the site.  Or getting home he developed pain in his left hip and lower back that has progressively worsened.  He has been unable to ambulate without excruciating pain.  He can only take 1 or 2 steps max.  Has no distal neurological deficits, is been taking Tylenol and leftover oxycodone without any improvement in his symptoms.  Has been using his baseline oxygen of 2 L with his sats generally around 95%.  ED Course: X-rays unremarkable but unable to ambulate.  Discussed with orthopedics who recommended MRI of the back.  I have requested CT scan of the left hip as well.  Patient will be placed in overnight observation.  Hospital Course   Patient is a 68 year old male with past medical history of left total hip replacement, COPD, chronic respiratory failure on 2 L of oxygen at home, sleep apnea but not tolerating CPAP, multivessel coronary artery disease who presented to the emergency department with complaints of left-sided hip and back pain.  He was having shooting pain on his left upper thigh.  MRI of the lumbar spine showed L3-L4 left foraminal disc protrusion impinging left L3 nerve root, left-sided foraminal stenosis, surrounding edema, lumbar spondylosis with L4-L5 severe right foraminal stenosis.  Orthopedics and  neurosurgery consulted.  Patient underwent blocking of the left L3 nerve root, but has continued to report significant pain.  Patient had discussed with the neurosurgeon that he would prefer the disc problem and radiculopathy managed medically, however, due to persistent back pain, patient has opted for surgical approach.  Patient went for left lumbar 3 4 extraforaminal microdiscectomy with microdissection, microsurgical technique and with operating microscope by Dr. Sherwood Gambler 10/28/2018 .   Left lower extremity lumbar radicular pain - as above,  initially tried with conservative management by neurosurgery, has undergone blocking of left L3 nerve root, continues to have significant pain. -Neurosurgery input greatly appreciated, was seen by both pulmonary and cardiology consultation, he understands he is a greater risk perioperatively for pulmonary complication in particular, wants to proceed with surgery. -Patient went for L3-4 extraforaminal microdiscectomy by Dr. Sherwood Gambler 10/28/2018 - Cardiology and pulmonary teams have been consulted to optimize patient prior to surgery. -Appears to be improving, was able to ambulate with PT yesterday, only minimal pain medicine requirement -Continue with Decadron taper per neurosurgery recommendation(he has 2 doses left as an outpatient)  COPD:  -He is on 2 L/min nasal cannula at baseline, follow with pulmonary Dr. Melvyn Novas as an outpatient, will encourage to use incentive spirometry perioperatively.  Hyperlipidemia:  Continue statin  Hypertension:  Has been stopped given hyperkalemia with potassium of 5.8, Avapro has been stopped on discharge, blood pressure remains acceptable on Norvasc  Hyperkalemia -Avapro has been stopped, potassium was 5.8, he did receive Kayexalate, repeat potassium came back at 5.7, he was started on Lokelma, repeat potassium is 4.9 this morning, he will be discharged today, I have discussed with him, to follow with PCP next week  regarding repeat labs, he will be discharged on 5 days of Lokelma, his Avapro has been stopped  Hyperglycemia -A1c came back elevated at 7.3, diagnostic of diabetes, he was on steroid during hospital stay, I have discussed with patient at length, and have instructed patient with diet compliance, if no improvement on follow-up likely will need to be started on medications  Solitary left pulmonary nodule on  CT:  - Being followed by pulmonology.  Last CT scan on November 2019 showed 1.3 cm left lower lobe lung nodule which has remained unchanged from 01/2018. He  Is a past smoker. -Patient is following with pulmonary Dr. Birdie Riddle as an outpatient regarding his nodules  Morbid obesity:  Healthy diet, and exercise when feasible.    Of the hospital staff was taking care of the patient tested positive for flu, so patient will be discharged on Tamiflu 75 mg oral  x 7 days for flu prophylaxis  Discharge Condition:  Stable   Follow UP  Follow-up Information    Lesleigh Noe, MD Follow up in 1 week(s).   Specialty:  Family Medicine Contact information: [address] [place] [id number] [phone]             Discharge Instructions  and  Discharge Medications    Discharge Instructions    Discharge instructions   Complete by:  As directed    Follow with Primary MD Lesleigh Noe, MD in 7 days   Get CBC, CMP,  checked  by Primary MD next visit.    Activity: As tolerated with Full fall precautions use walker/cane & assistance as needed   Disposition Home    Diet: Heart Healthy ,CARB MODIFIED, with feeding assistance and aspiration precautions.    On your next visit with your primary care physician please Get Medicines reviewed and adjusted.   Please request your Prim.MD to go over all Hospital Tests and Procedure/Radiological results at the follow up, please get all Hospital records sent to your Prim MD by signing hospital release before you go home.   If you  experience worsening of your admission symptoms, develop shortness of breath, life threatening emergency, suicidal or homicidal thoughts you must seek medical attention immediately by calling 911 or calling your MD immediately  if symptoms less  severe.  You Must read complete instructions/literature along with all the possible adverse reactions/side effects for all the Medicines you take and that have been prescribed to you. Take any new Medicines after you have completely understood and accpet all the possible adverse reactions/side effects.   Do not drive, operating heavy machinery, perform activities at heights, swimming or participation in water activities or provide baby sitting services if your were admitted for syncope or siezures until you have seen by Primary MD or a Neurologist and advised to do so again.  Do not drive when taking Pain medications.    Do not take more than prescribed Pain, Sleep and Anxiety Medications  Special Instructions: If you have smoked or chewed Tobacco  in the last 2 yrs please stop smoking, stop any regular Alcohol  and or any Recreational drug use.  Wear Seat belts while driving.   Please note  You were cared for by a hospitalist during your hospital stay. If you have any questions about your discharge medications or the care you received while you were in the hospital after you are discharged, you can call the unit and asked to speak with the hospitalist on call if the hospitalist that took care of you is not available. Once you are discharged, your primary care physician will handle any further medical issues. Please note that NO REFILLS for any discharge medications will be authorized once you are discharged, as it is imperative that you return to your primary care physician (or establish a relationship with a primary care physician if you do not have one) for your aftercare needs so that they can reassess your need for medications and monitor your lab  values.   Discharge wound care:   Complete by:  As directed    Discontinue dressing and leave open to air, unless there is any oozing.   encouraging ambulation 4-6 times per day in the halls.The wound should not need be covered for showers.  The Dermabond on the incision needs to be removed by the end of February.    Increase activity slowly   Complete by:  As directed      Allergies as of 10/31/2018   No Known Allergies     Medication List    STOP taking these medications   irbesartan 300 MG tablet Commonly known as:  AVAPRO     TAKE these medications   acetaminophen 325 MG tablet Commonly known as:  TYLENOL Take 2 tablets (650 mg total) by mouth every 6 (six) hours.   albuterol 108 (90 Base) MCG/ACT inhaler Commonly known as:  PROVENTIL HFA;VENTOLIN HFA Inhale 2 puffs into the lungs every 6 (six) hours as needed for wheezing or shortness of breath.   ALKA-SELTZER HEARTBURN + GAS 750-80 MG Chew Generic drug:  Calcium Carbonate-Simethicone Chew 1 tablet by mouth as needed (gas).   amLODipine 5 MG tablet Commonly known as:  NORVASC Take 5 mg by mouth at bedtime.   dexamethasone 2 MG tablet Commonly known as:  DECADRON Take 1 tablet (2 mg total) by mouth every other day. Start taking on:  November 01, 2018   fluticasone 50 MCG/ACT nasal spray Commonly known as:  FLONASE Place 2 sprays into both nostrils daily. What changed:    when to take this  reasons to take this   furosemide 20 MG tablet Commonly known as:  LASIX Take 1 tablet (20 mg total) by mouth daily as needed. What changed:  when to take this   gabapentin  300 MG capsule Commonly known as:  NEURONTIN Take 1 capsule (300 mg total) by mouth 3 (three) times daily.   ketoconazole 2 % shampoo Commonly known as:  NIZORAL Apply 1 application topically as needed for irritation.   metoprolol succinate 25 MG 24 hr tablet Commonly known as:  TOPROL-XL Take 25 mg by mouth daily.   nitroGLYCERIN 0.4 MG SL  tablet Commonly known as:  NITROSTAT Place 1 tablet (0.4 mg total) under the tongue every 5 (five) minutes x 3 doses as needed for chest pain.   omeprazole 40 MG capsule Commonly known as:  PRILOSEC Take 1 capsule (40 mg total) by mouth daily.   oseltamivir 75 MG capsule Commonly known as:  TAMIFLU Take 1 capsule (75 mg total) by mouth 2 (two) times daily.   oxyCODONE 5 MG immediate release tablet Commonly known as:  Oxy IR/ROXICODONE Take 1 tablet (5 mg total) by mouth every 6 (six) hours as needed for breakthrough pain.   OXYGEN Inhale 2 L into the lungs continuous.   rosuvastatin 10 MG tablet Commonly known as:  CRESTOR Take 10 mg by mouth at bedtime.   senna-docusate 8.6-50 MG tablet Commonly known as:  Senokot-S Take 2 tablets by mouth 2 (two) times daily.   sodium zirconium cyclosilicate 10 g Pack packet Commonly known as:  LOKELMA Take 10 g by mouth daily. 5 days then stop   tamsulosin 0.4 MG Caps capsule Commonly known as:  FLOMAX Take 0.4 mg by mouth at bedtime.   VITAMIN D PO Take 1 capsule by mouth daily.            Discharge Care Instructions  (From admission, onward)         Start     Ordered   10/30/18 0000  Discharge wound care:    Comments:  Discontinue dressing and leave open to air, unless there is any oozing.   encouraging ambulation 4-6 times per day in the halls.The wound should not need be covered for showers.  The Dermabond on the incision needs to be removed by the end of February.    10/30/18 1508            Diet and Activity recommendation: See Discharge Instructions above   Consults obtained -   Neurosurgery, IR, cardiology, pulmonary   Major procedures and Radiology Reports - PLEASE review detailed and final reports for all details, in brief -   left lumbar 3 4 extraforaminal microdiscectomy with microdissection, microsurgical technique and with operating microscope by Dr. Sherwood Gambler 10/28/2018 .    Dg Lumbar Spine  2-3 Views  Result Date: 10/28/2018 CLINICAL DATA:  Localization films in OR 21 EXAM: LUMBAR SPINE - 2-3 VIEW COMPARISON:  Lumbar MRI 10/21/2018 FINDINGS: Two lateral intraoperative effusion provided. Using the numbering convention of comparison MRI the first film demonstrates sharp tip probes at the L2-L3 and L3-L4 disc levels. Second film demonstrates skin spreaders posterior to the L3-L4 disc level as well as a curved tip probe at this level. IMPRESSION: Two intraoperative views as described above. Electronically Signed   By: Suzy Bouchard M.D.   On: 10/28/2018 16:05   Dg Lumbar Spine Complete  Result Date: 10/21/2018 CLINICAL DATA:  Lumbago EXAM: LUMBAR SPINE - COMPLETE 4+ VIEW COMPARISON:  None. FINDINGS: Frontal, lateral, spot lumbosacral lateral, and bilateral oblique views were obtained. There are 5 non-rib-bearing lumbar type vertebral bodies. There is no fracture or spondylolisthesis. There is moderate disc space narrowing at L4-5 and L5-S1. There are anterior  osteophytes at all levels. Posterior osteophytes are noted at L4 and L5. No erosive changes are evident. There is facet osteoarthritic change all levels bilaterally, most notably at L5-S1 bilaterally. 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 replacement on the left. 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 No fracture or spondylolisthesis. There is aortoiliac atherosclerosis. Electronically Signed   By: Lowella Grip III M.D.   On: 10/21/2018 10:41   Mr Lumbar Spine W Wo Contrast  Result Date: 10/21/2018 CLINICAL DATA:  68 y/o  M; left hip and back pain. EXAM: MRI LUMBAR SPINE WITHOUT AND WITH CONTRAST TECHNIQUE: Multiplanar and multiecho pulse sequences of the lumbar spine were obtained without and with intravenous contrast. CONTRAST:  10 cc Gadavist COMPARISON:  None. FINDINGS: Segmentation:  Standard. Alignment:  Physiologic. Vertebrae: No fracture, evidence of discitis, or bone lesion. No  abnormal enhancement. Conus medullaris and cauda equina: Conus extends to the L1-2 level. Fatty filum. Otherwise conus and cauda equina appear normal. No abnormal enhancement. Paraspinal and other soft tissues: Negative. Disc levels: L1-2: No significant disc displacement, foraminal stenosis, or canal stenosis. L2-3: Mild disc bulge and facet hypertrophy. Mild bilateral foraminal stenosis. No canal stenosis. Right lateral and anterior annular fissure. L3-4: Mild disc bulge and facet hypertrophy. 11 mm left foraminal disc protrusion with mild surrounding edema and enhancement, likely recent (series 7, image 14 and series 8, image 26). Protrusion impinges the exiting left L3 nerve root and results in severe left-sided foraminal stenosis. Mild right foraminal stenosis. No canal stenosis. L4-5: Disc bulge, mild facet hypertrophy, as well as right foraminal and extraforaminal disc protrusion with prominent endplate marginal osteophytes. Mild left and severe right foraminal stenosis. Mild spinal canal stenosis and partial effacement of lateral recesses. L5-S1: Disc bulge with endplate marginal osteophytes eccentric to the left foraminal and extraforaminal zones combined with mild facet hypertrophy. Mild right and moderate left foraminal stenosis. No spinal canal stenosis. IMPRESSION: 1. No acute osseous abnormality or malalignment. 2. L3-4 left foraminal disc protrusion impinges the exiting left L3 nerve root and results in severe left-sided foraminal stenosis. Surrounding edema indicates the protrusion is likely recent. 3. Lumbar spondylosis with L4-5 severe right foraminal stenosis, L5-S1 moderate left foraminal stenosis, multilevel mild foraminal stenosis. No significant spinal canal stenosis. Electronically Signed   By: Kristine Garbe M.D.   On: 10/21/2018 18:27   Ct Hip Left Wo Contrast  Result Date: 10/21/2018 CLINICAL DATA:  Left hip pain for 1 week which began while walking up a steep hill. EXAM: CT  OF THE LEFT HIP WITHOUT CONTRAST TECHNIQUE: Multidetector CT imaging of the left hip was performed according to the standard protocol. Multiplanar CT image reconstructions were also generated. COMPARISON:  Plain films left hip 10/21/2018 and CT abdomen and pelvis 01/28/2018 in 05/05/2014. FINDINGS: Bones/Joint/Cartilage Left hip replacement is identified with a single wire across the posterior aspect of the intertrochanteric femur, unchanged. The hip is located. No hardware complication is identified. There is no acute fracture. Ligaments Suboptimally assessed by CT. Muscles and Tendons Intact and normal in appearance. Soft tissues Intrapelvic contents demonstrate atherosclerosis. IMPRESSION: No acute abnormality or finding to explain the patient's pain. Left hip arthroplasty in place without complicating feature. Atherosclerosis. Electronically Signed   By: Inge Rise M.D.   On: 10/21/2018 14:34   Ir Inject Diag/thera/inc Needle/cath/plc Epi/lumb/sac W/img  Result Date: 10/23/2018 CLINICAL DATA:  Left L3-4 foraminal to extraforaminal disc herniation with left L3 radicular symptoms. EXAM: Left L3 nerve root block  and trans foraminal epidural COMPARISON:  MRI 10/21/2018 PROCEDURE: The procedure was discussed in depth with the patient. Relevant imaging was reviewed. The patient was placed prone on the fluoroscopic table and a betadine scrub of the low back was performed. The patient was draped in sterile fashion. Skin and subcutaneous anesthesia was carried out using 1% lidocaine. A curved 5 inch 22 gauge spinal needle was directed under fluoroscopic guidance adjacent to the nerve. Isovue 200 was injected for confirmation of needle placement. Contrast outlines the nerve root and shows selective epidural spread. No venous communication. 120 mg of Depo-Medrol mixed with 2 ml of 1% lidocaine were injected. The procedure was well tolerated. The patient was returned to the floor in good condition. No additional  postprocedure orders. IMPRESSION: Technically successful left L3 nerve root block and transforaminal epidural Electronically Signed   By: Nelson Chimes M.D.   On: 10/23/2018 15:24   Dg Hip Unilat W Or Wo Pelvis 2-3 Views Left  Result Date: 10/21/2018 CLINICAL DATA:  Pain EXAM: DG HIP (WITH OR WITHOUT PELVIS) 2-3V LEFT COMPARISON:  CT abdomen and pelvis with bony reformats Jan 28, 2018 FINDINGS: Frontal pelvis as well as frontal and lateral left hip images were obtained. Patient is status post total hip replacement on the left with prosthetic components well-seated. No acute fracture or dislocation evident. There is moderate osteoarthritic change in the right hip joint, stable. No erosive changes are appreciable. Bony overgrowth along the right acetabulum is noted. There is calcification in the common and superficial femoral arteries bilaterally. IMPRESSION: No acute fracture or dislocation. Status post total hip replacement on the left with prosthetic components on the left well seated. Moderate arthropathy in the right hip joint, stable, with bony overgrowth along the right acetabulum. 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 No erosive change. Multifocal arterial vascular calcification noted. Electronically Signed   By: Lowella Grip III M.D.   On: 10/21/2018 10:40    Micro Results     No results found for this or any previous visit (from the past 240 hour(s)).     Today   Subjective:   Alfred Carney today has no headache,no chest or abdominal pain, good bowel movements yesterday Objective:   Blood pressure (!) 156/71, pulse (!) 57, temperature (!) 97.5 F (36.4 C), temperature source Oral, resp. rate 16, height 5\' 9"  (1.753 m), weight 134.7 kg, SpO2 93 %.   Intake/Output Summary (Last 24 hours) at 10/31/2018 1012 Last data filed at 10/31/2018 0900 Gross per 24 hour  Intake 240 ml  Output -  Net 240 ml    Exam Awake Alert, Oriented X 3, No new F.N  deficits, Normal affect Symmetrical Chest wall movement, Good air movement bilaterally, CTAB RRR,No Gallops,Rubs or new Murmurs, No Parasternal Heave +ve B.Sounds, Abd Soft, No tenderness, No rebound - guarding or rigidity. No Cyanosis, Clubbing or edema, No new Rash or bruise     Data Review   CBC w Diff:  Lab Results  Component Value Date   WBC 11.7 (H) 10/27/2018   HGB 16.7 10/27/2018   HCT 50.4 10/27/2018   PLT 127 (L) 10/27/2018   LYMPHOPCT 8 10/27/2018   MONOPCT 7 10/27/2018   EOSPCT 0 10/27/2018   BASOPCT 0 10/27/2018    CMP:  Lab Results  Component Value Date   NA 138 10/31/2018   K 4.9 10/31/2018   CL 103 10/31/2018   CO2 29 10/31/2018   BUN 29 (H) 10/31/2018  CREATININE 1.23 10/31/2018   CREATININE 1.16 01/13/2015   PROT 6.7 07/19/2014   ALBUMIN 4.2 07/19/2014   BILITOT 0.4 07/19/2014   ALKPHOS 81 07/19/2014   AST 19 07/19/2014   ALT 16 07/19/2014  .   Total Time in preparing paper work, data evaluation and todays exam - 18 minutes  Phillips Climes M.D   [facility]   Office  [phone]

## 2018-10-30 NOTE — Progress Notes (Signed)
Occupational Therapy Treatment Patient Details Name: Alfred Carney MRN: [id number] DOB: [birth year] Today's Date: 10/30/2018    History of present illness Mr. Rosamond is a 68 yo male who presented to the ED with approximately 1 week history of pain which originates over his left buttock area and lower back.  Left L3-4 extraforaminal microdiscectomy. Significant PMH of L THA in 2014, chronic respiratory failure, morbid obesity, type 2 diabetes.   OT comments  Pt performing bed mobility with supervisionA and bed rails with log roll technique. Pt performing LB dressing with AE provided from hip kit with fair carry over skills and minA for donning socks. Pt sitting EOB with good balance. Pt reports exhaustion from recent bowel movements for the past 2 hours. Pt plans to go home with spouse for assistance with ADL. Pt reports having reacher and LH sponge. Pt encouraged to continue to use them to increase independence. Pt with minimal pain in back. Pt progressing and continues to benefit from continued OT skilled services for ADL, mobility and safety.     Follow Up Recommendations  No OT follow up;Supervision - Intermittent    Equipment Recommendations  None recommended by OT    Recommendations for Other Services      Precautions / Restrictions Precautions Precautions: Fall Precaution Comments: reviewed back precautions with pt; O2 on 2L Restrictions Weight Bearing Restrictions: No       Mobility Bed Mobility Overal bed mobility: Modified Independent Bed Mobility: Sidelying to Sit;Rolling Rolling: Supervision Sidelying to sit: Supervision       General bed mobility comments: use of rail  Transfers Overall transfer level: Needs assistance Equipment used: Rolling walker (2 wheeled) Transfers: Sit to/from Stand Sit to Stand: Supervision         General transfer comment: for safety    Balance Overall balance assessment: Mild deficits observed, not formally tested                                         ADL either performed or assessed with clinical judgement   ADL Overall ADL's : Needs assistance/impaired                     Lower Body Dressing: Moderate assistance;With adaptive equipment;Cueing for safety;Sitting/lateral leans Lower Body Dressing Details (indicate cue type and reason): education provided on hip kit and pt not interested at this time for purchase, but performed LB dressing with materials with minA at EOB. Pt reports "my wife will help me wth this."             Functional mobility during ADLs: Supervision/safety General ADL Comments: pt has reacher and DME at home, wife will ne able to assist 24/7     Vision       Perception     Praxis      Cognition Arousal/Alertness: Awake/alert Behavior During Therapy: WFL for tasks assessed/performed Overall Cognitive Status: Within Functional Limits for tasks assessed                                 General Comments: Wanted to rest, but agreed to learn about AE        Exercises     Shoulder Instructions       General Comments      Pertinent Vitals/ Pain  Pain Assessment: Faces Faces Pain Scale: Hurts little more Pain Location: back and L LE Pain Descriptors / Indicators: Sore Pain Intervention(s): Limited activity within patient's tolerance  Home Living                                          Prior Functioning/Environment              Frequency  Min 2X/week        Progress Toward Goals  OT Goals(current goals can now be found in the care plan section)  Progress towards OT goals: Progressing toward goals  Acute Rehab OT Goals Patient Stated Goal: stop hurting OT Goal Formulation: With patient/family Time For Goal Achievement: 11/07/18 Potential to Achieve Goals: Good ADL Goals Pt Will Perform Lower Body Bathing: with modified independence;sit to/from stand Pt Will Perform Lower Body Dressing:  with modified independence;sit to/from stand Pt Will Transfer to Toilet: with modified independence;ambulating;bedside commode Pt Will Perform Tub/Shower Transfer: 3 in 1;Tub transfer;ambulating  Plan Discharge plan remains appropriate    Co-evaluation                 AM-PAC OT "6 Clicks" Daily Activity     Outcome Measure   Help from another person eating meals?: None Help from another person taking care of personal grooming?: None Help from another person toileting, which includes using toliet, bedpan, or urinal?: A Little Help from another person bathing (including washing, rinsing, drying)?: None Help from another person to put on and taking off regular upper body clothing?: None Help from another person to put on and taking off regular lower body clothing?: A Little 6 Click Score: 22    End of Session    OT Visit Diagnosis: Unsteadiness on feet (R26.81);Pain Pain - Right/Left: Left Pain - part of body: Leg   Activity Tolerance Patient tolerated treatment well   Patient Left in bed;with call bell/phone within reach   Nurse Communication Mobility status        Time: 1550-1611 OT Time Calculation (min): 21 min  Charges: OT General Charges $OT Visit: 1 Visit OT Treatments $Self Care/Home Management : 8-22 mins  Darryl Nestle) Marsa Aris OTR/L Acute Rehabilitation Services Pager: [phone] Office: [phone]    Fredda Hammed 10/30/2018, 4:12 PM

## 2018-10-30 NOTE — Progress Notes (Signed)
Physical Therapy Treatment Patient Details Name: Alfred Carney MRN: [id number] DOB: 1951/02/05 Today's Date: 10/30/2018    History of Present Illness Alfred Carney is a 68 yo male who presented to the ED with approximately 1 week history of pain which originates over his left buttock area and lower back. Significant PMH of L THA in 2014, chronic respiratory failure, morbid obesity, type 2 diabetes.    PT Comments    Patient seen for mobility progression. Pt is able to ambulate 100 ft with seated rest break due to SOB/fatigue and ascend/descend 3 stairs simulating home entrance. Pt appears more fatigued today but reports that pain remains tolerable. Pt will continue to benefit from further skilled PT services to maximize independence and safety with mobility.    Follow Up Recommendations  Home health PT     Equipment Recommendations  Other (comment)(wide RW)    Recommendations for Other Services       Precautions / Restrictions Precautions Precautions: Fall Precaution Comments: reviewed back precautions with pt    Mobility  Bed Mobility Overal bed mobility: Modified Independent Bed Mobility: Sidelying to Sit;Rolling           General bed mobility comments: use of rail  Transfers Overall transfer level: Needs assistance Equipment used: Rolling walker (2 wheeled) Transfers: Sit to/from Stand Sit to Stand: Supervision         General transfer comment: for safety  Ambulation/Gait Ambulation/Gait assistance: Supervision;Min guard Gait Distance (Feet): 100 Feet Assistive device: Rolling walker (2 wheeled) Gait Pattern/deviations: Step-through pattern;Decreased stride length;Wide base of support     General Gait Details: cues for posture; seated break required after stair training due to SOB/fatigue   Stairs   Stairs assistance: Min guard Stair Management: One rail Left;Step to pattern;Forwards Number of Stairs: 3 General stair comments: min guard for  safety   Wheelchair Mobility    Modified Rankin (Stroke Patients Only)       Balance Overall balance assessment: Mild deficits observed, not formally tested                                          Cognition Arousal/Alertness: Awake/alert Behavior During Therapy: WFL for tasks assessed/performed Overall Cognitive Status: Within Functional Limits for tasks assessed                                        Exercises      General Comments        Pertinent Vitals/Pain Pain Assessment: Faces Faces Pain Scale: Hurts little more Pain Location: back and L LE Pain Descriptors / Indicators: Sore Pain Intervention(s): Limited activity within patient's tolerance;Monitored during session;Premedicated before session;Repositioned    Home Living                      Prior Function            PT Goals (current goals can now be found in the care plan section) Progress towards PT goals: Progressing toward goals    Frequency    Min 3X/week      PT Plan Current plan remains appropriate    Co-evaluation              AM-PAC PT "6 Clicks" Mobility   Outcome Measure  Help needed turning from  your back to your side while in a flat bed without using bedrails?: None Help needed moving from lying on your back to sitting on the side of a flat bed without using bedrails?: A Little Help needed moving to and from a bed to a chair (including a wheelchair)?: A Little Help needed standing up from a chair using your arms (e.g., wheelchair or bedside chair)?: A Little Help needed to walk in hospital room?: A Little Help needed climbing 3-5 steps with a railing? : A Little 6 Click Score: 19    End of Session Equipment Utilized During Treatment: Gait belt Activity Tolerance: Patient tolerated treatment well Patient left: with call bell/phone within reach;in chair Nurse Communication: Mobility status PT Visit Diagnosis: Pain;Difficulty in  walking, not elsewhere classified (R26.2) Pain - Right/Left: Left Pain - part of body: Hip;Leg     Time: 3664-4034 PT Time Calculation (min) (ACUTE ONLY): 20 min  Charges:  $Gait Training: 8-22 mins                     Earney Navy, PTA Acute Rehabilitation Services Pager: [phone] Office: [phone]     Darliss Cheney 10/30/2018, 1:03 PM

## 2018-10-31 DIAGNOSIS — M5416 Radiculopathy, lumbar region: Secondary | ICD-10-CM

## 2018-10-31 DIAGNOSIS — M5126 Other intervertebral disc displacement, lumbar region: Secondary | ICD-10-CM

## 2018-10-31 LAB — BASIC METABOLIC PANEL
Anion gap: 6 (ref 5–15)
BUN: 29 mg/dL — ABNORMAL HIGH (ref 8–23)
CO2: 29 mmol/L (ref 22–32)
Calcium: 8.1 mg/dL — ABNORMAL LOW (ref 8.9–10.3)
Chloride: 103 mmol/L (ref 98–111)
Creatinine, Ser: 1.23 mg/dL (ref 0.61–1.24)
GFR calc Af Amer: >60 mL/min (ref >60)
GFR calc non Af Amer: 60 mL/min — ABNORMAL LOW (ref >60)
Glucose, Bld: 164 mg/dL — ABNORMAL HIGH (ref 70–99)
Potassium: 4.9 mmol/L (ref 3.5–5.1)
Sodium: 138 mmol/L (ref 135–145)

## 2018-10-31 LAB — GLUCOSE, CAPILLARY
Glucose-Capillary: 131 mg/dL — ABNORMAL HIGH (ref 70–99)
Glucose-Capillary: 129 mg/dL — ABNORMAL HIGH (ref 70–99)

## 2018-10-31 MED ORDER — OSELTAMIVIR PHOSPHATE 75 MG PO CAPS: 75.0000 mg | ORAL_CAPSULE | Freq: Two times a day (BID) | ORAL | 0 refills | Status: DC

## 2018-10-31 MED ORDER — GABAPENTIN 300 MG PO CAPS: 300.0000 mg | ORAL_CAPSULE | Freq: Three times a day (TID) | ORAL | 0 refills | Status: DC

## 2018-10-31 MED ORDER — SODIUM ZIRCONIUM CYCLOSILICATE 10 G PO PACK: 10.0000 g | PACK | Freq: Every day | ORAL | 0 refills | Status: DC

## 2018-10-31 NOTE — Progress Notes (Signed)
Subjective: The patient is alert and pleasant.  He wants to go home.  Objective: Vital signs in last 24 hours: Temp:  [97.5 F (36.4 C)-98.4 F (36.9 C)] 97.5 F (36.4 C) (02/15 0439) Pulse Rate:  [57-62] 57 (02/15 0440) Resp:  [16] 16 (02/15 0440) BP: (137-156)/(64-71) 156/71 (02/15 0440) SpO2:  [93 %-97 %] 93 % (02/15 0859) Estimated body mass index is 43.84 kg/m as calculated from the following:   Height as of this encounter: 5\' 9"  (1.753 m).   Weight as of this encounter: 134.7 kg.   Intake/Output from previous day: No intake/output data recorded. Intake/Output this shift: Total I/O In: 240 [P.O.:240] Out: -   Physical exam patient is alert and oriented.  His wound is healing well without drainage.  He is moving his lower extremities well.  Lab Results: No results for input(s): WBC, HGB, HCT, PLT in the last 72 hours. BMET Recent Labs    10/30/18 1425 10/31/18 0505  NA 141 138  K 5.7* 4.9  CL 102 103  CO2 29 29  GLUCOSE 160* 164*  BUN 37* 29*  CREATININE 1.53* 1.23  CALCIUM 8.7* 8.1*    Studies/Results: No results found.  Assessment/Plan: Postop day #3: The patient is doing well.  I gave him his discharge instructions and have answered all his questions.  I instructed him to call the office next week for a follow-up appointment.  LOS: 9 days     Ophelia Charter 10/31/2018, 10:02 AM

## 2018-10-31 NOTE — Progress Notes (Signed)
Pt alert, able to make needs known. Wife at bedside, Pt states pain is manageable. Medicated for pain prior to discharge. Incision is clean dry and intact. No acute distress noted. Continues on tamiflu, no symptoms of the flu noted. Discharge teaching to pt and wife.Verbalized understanding.

## 2018-11-02 ENCOUNTER — Telehealth: Payer: Self-pay

## 2018-11-02 DIAGNOSIS — Z4789 Encounter for other orthopedic aftercare: Secondary | ICD-10-CM | POA: Diagnosis not present

## 2018-11-02 DIAGNOSIS — M4726 Other spondylosis with radiculopathy, lumbar region: Secondary | ICD-10-CM | POA: Diagnosis not present

## 2018-11-02 DIAGNOSIS — Z981 Arthrodesis status: Secondary | ICD-10-CM | POA: Diagnosis not present

## 2018-11-02 DIAGNOSIS — M48061 Spinal stenosis, lumbar region without neurogenic claudication: Secondary | ICD-10-CM | POA: Diagnosis not present

## 2018-11-02 DIAGNOSIS — E1151 Type 2 diabetes mellitus with diabetic peripheral angiopathy without gangrene: Secondary | ICD-10-CM | POA: Diagnosis not present

## 2018-11-02 DIAGNOSIS — E1165 Type 2 diabetes mellitus with hyperglycemia: Secondary | ICD-10-CM | POA: Diagnosis not present

## 2018-11-02 DIAGNOSIS — I2510 Atherosclerotic heart disease of native coronary artery without angina pectoris: Secondary | ICD-10-CM | POA: Diagnosis not present

## 2018-11-02 DIAGNOSIS — I10 Essential (primary) hypertension: Secondary | ICD-10-CM | POA: Diagnosis not present

## 2018-11-02 DIAGNOSIS — J439 Emphysema, unspecified: Secondary | ICD-10-CM | POA: Diagnosis not present

## 2018-11-02 DIAGNOSIS — J9611 Chronic respiratory failure with hypoxia: Secondary | ICD-10-CM | POA: Diagnosis not present

## 2018-11-02 NOTE — Telephone Encounter (Signed)
LM requesting call back to complete TCM and confirm hospital follow up.   

## 2018-11-02 NOTE — Telephone Encounter (Signed)
Transition Care Management Follow-up Telephone Call   Date discharged? 10/21/2018-10/31/2018  Lumbar disc herniation   How have you been since you were released from the hospital? "It's rough"   Do you understand why you were in the hospital? yes   Do you understand the discharge instructions? yes   Where were you discharged to? Home. Resides with wife.    Items Reviewed:  Medications reviewed: no, Pt declined med review stating they are the same. Advised to bring meds to f/u appt.   Allergies reviewed: yes  Dietary changes reviewed: yes  Referrals reviewed: yes, [facility] to visit today for PT   Functional Questionnaire:   Activities of Daily Living (ADLs):   He states they are independent in the following: ambulation, bathing and hygiene, feeding, continence, grooming, toileting and dressing States they require assistance with the following: None.    Any transportation issues/concerns?: no   Any patient concerns? no   Confirmed importance and date/time of follow-up visits scheduled yes  Provider Appointment booked with PCP 11/04/2018   Confirmed with patient if condition begins to worsen call PCP or go to the ER.  Patient was given the office number and encouraged to call back with question or concerns.  : yes

## 2018-11-03 ENCOUNTER — Telehealth: Payer: Self-pay

## 2018-11-03 NOTE — Telephone Encounter (Signed)
Given that ketoconazole is topical, it is safe to continue both medications. On my review, no interaction.

## 2018-11-03 NOTE — Telephone Encounter (Signed)
Amber PT with [facility] notified as instructed and voiced understanding.

## 2018-11-03 NOTE — Telephone Encounter (Signed)
Alfred Carney PT with [facility] left v/m that pt was seen on 11/02/18 and pt has possible drug interaction level 1 between tamsulosin and ketoconazole shampoo.Please advise.

## 2018-11-04 ENCOUNTER — Ambulatory Visit (INDEPENDENT_AMBULATORY_CARE_PROVIDER_SITE_OTHER): Payer: Medicare HMO | Admitting: Family Medicine

## 2018-11-04 ENCOUNTER — Encounter: Payer: Self-pay | Admitting: Family Medicine

## 2018-11-04 VITALS — BP 154/66 | HR 81 | Temp 97.7°F | Resp 20 | Ht 68.5 in | Wt 300.2 lb

## 2018-11-04 DIAGNOSIS — N400 Enlarged prostate without lower urinary tract symptoms: Secondary | ICD-10-CM | POA: Diagnosis not present

## 2018-11-04 DIAGNOSIS — Z1159 Encounter for screening for other viral diseases: Secondary | ICD-10-CM

## 2018-11-04 DIAGNOSIS — E1122 Type 2 diabetes mellitus with diabetic chronic kidney disease: Secondary | ICD-10-CM | POA: Insufficient documentation

## 2018-11-04 DIAGNOSIS — Z9889 Other specified postprocedural states: Secondary | ICD-10-CM | POA: Diagnosis not present

## 2018-11-04 DIAGNOSIS — I10 Essential (primary) hypertension: Secondary | ICD-10-CM | POA: Diagnosis not present

## 2018-11-04 DIAGNOSIS — E875 Hyperkalemia: Secondary | ICD-10-CM | POA: Diagnosis not present

## 2018-11-04 DIAGNOSIS — N182 Chronic kidney disease, stage 2 (mild): Secondary | ICD-10-CM

## 2018-11-04 DIAGNOSIS — L210 Seborrhea capitis: Secondary | ICD-10-CM | POA: Diagnosis not present

## 2018-11-04 LAB — CBC WITH DIFFERENTIAL/PLATELET
Basophils Absolute: 0 10*3/uL (ref 0.0–0.1)
Basophils Relative: 0.2 % (ref 0.0–3.0)
Eosinophils Absolute: 0.1 10*3/uL (ref 0.0–0.7)
Eosinophils Relative: 1.3 % (ref 0.0–5.0)
HCT: 49.7 % (ref 39.0–52.0)
Hemoglobin: 16.4 g/dL (ref 13.0–17.0)
Lymphocytes Relative: 17.9 % (ref 12.0–46.0)
Lymphs Abs: 2 10*3/uL (ref 0.7–4.0)
MCHC: 32.9 g/dL (ref 30.0–36.0)
MCV: 91.9 fl (ref 78.0–100.0)
Monocytes Absolute: 0.9 10*3/uL (ref 0.1–1.0)
Monocytes Relative: 7.9 % (ref 3.0–12.0)
Neutro Abs: 8.2 10*3/uL — ABNORMAL HIGH (ref 1.4–7.7)
Neutrophils Relative %: 72.7 % (ref 43.0–77.0)
Platelets: 219 10*3/uL (ref 150.0–400.0)
RBC: 5.41 Mil/uL (ref 4.22–5.81)
RDW: 15.5 % (ref 11.5–15.5)
WBC: 11.3 10*3/uL — ABNORMAL HIGH (ref 4.0–10.5)

## 2018-11-04 LAB — BASIC METABOLIC PANEL
BUN: 20 mg/dL (ref 6–23)
CO2: 33 mEq/L — ABNORMAL HIGH (ref 19–32)
Calcium: 9.4 mg/dL (ref 8.4–10.5)
Chloride: 100 mEq/L (ref 96–112)
Creatinine, Ser: 1.22 mg/dL (ref 0.40–1.50)
GFR: 58.92 mL/min — ABNORMAL LOW (ref >60.00)
Glucose, Bld: 107 mg/dL — ABNORMAL HIGH (ref 70–99)
Potassium: 5 mEq/L (ref 3.5–5.1)
Sodium: 144 mEq/L (ref 135–145)

## 2018-11-04 LAB — MICROALBUMIN / CREATININE URINE RATIO
Creatinine,U: 36.6 mg/dL
Microalb Creat Ratio: 1.9 mg/g (ref 0.0–30.0)
Microalb, Ur: <0.7 mg/dL (ref 0.0–1.9)

## 2018-11-04 MED ORDER — TAMSULOSIN HCL 0.4 MG PO CAPS: 0.4000 mg | ORAL_CAPSULE | Freq: Every day | ORAL | 3 refills | Status: DC

## 2018-11-04 MED ORDER — OXYCODONE HCL 5 MG PO TABS: 5.0000 mg | ORAL_TABLET | Freq: Four times a day (QID) | ORAL | 0 refills | Status: DC | PRN

## 2018-11-04 MED ORDER — AMLODIPINE BESYLATE 10 MG PO TABS: 10.0000 mg | ORAL_TABLET | Freq: Every day | ORAL | 3 refills | Status: DC

## 2018-11-04 MED ORDER — KETOCONAZOLE 2 % EX SHAM: 1.0000 "application " | MEDICATED_SHAMPOO | CUTANEOUS | 1 refills | Status: DC | PRN

## 2018-11-04 MED ORDER — GLIPIZIDE 5 MG PO TABS: ORAL_TABLET | ORAL | 3 refills | Status: DC

## 2018-11-04 NOTE — Assessment & Plan Note (Signed)
Using 2 oxycodone a day. Discussed and advised neurosurgery follow-up given numbness. Refill of oxycodone #15 to help with transition back home. HH PT planned. Will assist in coordination as needed.

## 2018-11-04 NOTE — Assessment & Plan Note (Signed)
Irbesartan held in the hospital 2/2 to Hyperkalemia. BP now elevated. Will increase amlodipine 5>10 for better control. Given diabetes will likely benefit from ARB will see if he will tolerate w/o hyperK in the future. Repeat labs today

## 2018-11-04 NOTE — Assessment & Plan Note (Signed)
Refill for prn use provided. Do not think this will interact with Tamsulosin as was the concern for the home health nurse. Safe to take as not systemic

## 2018-11-04 NOTE — Assessment & Plan Note (Signed)
Refill provided

## 2018-11-04 NOTE — Patient Instructions (Addendum)
#  Hip Pain - recommend call the neurosurgeon's office  #Hyperkalemia - noticed in the hospital - stop taking the Avapro - increase the norvasc to 10 mg daily  #Diabetes - consider meeting with a diabetes education for nutrition - start glipizide 5 mg tomorrow, increase to 10 mg after 1 week

## 2018-11-04 NOTE — Assessment & Plan Note (Signed)
Discussed that he had diabetes and would benefit from medication. Will start glipizide today. Return in 4 weeks. Repeat HgbA1c in 3 months. Declined dietary intervention at this time.

## 2018-11-04 NOTE — Progress Notes (Signed)
Subjective:     Alfred Carney is a 68 y.o. male presenting for Hospitalization Follow-up (D/C on 10/31/2018. patient did have spinal infusion surgery.)     HPI   #Hip pain - had a spinal surgery in the hospital - leg has since gone numb - initially went to the hospital thinking it was hip related (s/p hip replacement)  - leg has been heavy and numb since the surgery on the left side - [facility] - Neurosurgery follow-up in 3 weeks - suppose to get cleared for PT by NSG  #Diabetes - was on metformin in the past - has not been taking metformin for a long time due to side effects - not going to make diet changes  #COPD - breathing has been good - sleeping well - able to sleep in the bed now - swelling is going down from feet - seeing pulmonology on Friday  Review of Systems  Constitutional: Negative for chills and fever.  Respiratory: Negative for chest tightness and shortness of breath.   Cardiovascular: Positive for leg swelling (at baseline, improved). Negative for chest pain and palpitations.  Musculoskeletal:       Leg pain     2/5-2/15: Hospitalization: Pain of the left hip ->develope severe hip pain in Jan. ER with unremarkable hip films and MRI per ortho. MRI with L3-4 left foraminal disc protusion. Initially got nerve block w/o improvement and opted for surgery on 2/12 for microdiscectomy. Able to ambulate with PT. On Decadron taper.  - COPD: stable on 2 L, incentive spirometry - HTN: stopped Avapro 2/2 to hyperkalemia. Cont Norvasc with control - HyperK: received kayexalate -- needs repeat BMP. 5 days of lokelm   Social History   Tobacco Use  Smoking Status Former Smoker  . Packs/day: 1.00  . Years: 35.00  . Pack years: 35.00  . Types: Cigarettes  . Last attempt to quit: 09/16/2006  . Years since quitting: 12.1  Smokeless Tobacco Never Used        Objective:    BP Readings from Last 3 Encounters:  11/04/18 (!) 154/66    10/31/18 (!) 156/71  08/19/18 134/72   Wt Readings from Last 3 Encounters:  11/04/18 (!) 300 lb 4 oz (136.2 kg)  10/23/18 296 lb 14.4 oz (134.7 kg)  08/19/18 (!) 313 lb 8 oz (142.2 kg)    BP (!) 154/66   Pulse 81   Temp 97.7 F (36.5 C)   Resp 20   Ht 5' 8.5" (1.74 m)   Wt (!) 300 lb 4 oz (136.2 kg)   SpO2 95%   BMI 44.99 kg/m    Physical Exam Constitutional:      Appearance: Normal appearance. He is not ill-appearing or diaphoretic.  HENT:     Right Ear: External ear normal.     Left Ear: External ear normal.     Nose: Nose normal.  Eyes:     General: No scleral icterus.    Extraocular Movements: Extraocular movements intact.     Conjunctiva/sclera: Conjunctivae normal.  Neck:     Musculoskeletal: Neck supple.  Cardiovascular:     Rate and Rhythm: Normal rate and regular rhythm.     Heart sounds: Murmur present.  Pulmonary:     Effort: Pulmonary effort is normal. No respiratory distress.     Breath sounds: Normal breath sounds. No wheezing or rales.  Musculoskeletal:     Right lower leg: 1+ Pitting Edema present.  Left lower leg: 1+ Pitting Edema present.     Comments: Ambulates with cane and slight limp  Skin:    General: Skin is warm and dry.     Capillary Refill: Capillary refill takes less than 2 seconds.  Neurological:     Mental Status: He is alert. Mental status is at baseline.  Psychiatric:        Mood and Affect: Mood normal.           Assessment & Plan:   Problem List Items Addressed This Visit      Cardiovascular and Mediastinum   Essential hypertension    Irbesartan held in the hospital 2/2 to Hyperkalemia. BP now elevated. Will increase amlodipine 5>10 for better control. Given diabetes will likely benefit from ARB will see if he will tolerate w/o hyperK in the future. Repeat labs today      Relevant Medications   amLODipine (NORVASC) 10 MG tablet   Other Relevant Orders   CBC with Differential   Basic metabolic panel      Endocrine   Type 2 diabetes mellitus with stage 2 chronic kidney disease, without long-term current use of insulin ([place]) - Primary    Discussed that he had diabetes and would benefit from medication. Will start glipizide today. Return in 4 weeks. Repeat HgbA1c in 3 months. Declined dietary intervention at this time.       Relevant Medications   glipiZIDE (GLUCOTROL) 5 MG tablet   Other Relevant Orders   CBC with Differential   Microalbumin / creatinine urine ratio     Musculoskeletal and Integument   Dandruff in adult    Refill for prn use provided. Do not think this will interact with Tamsulosin as was the concern for the home health nurse. Safe to take as not systemic      Relevant Medications   ketoconazole (NIZORAL) 2 % shampoo     Genitourinary   Benign prostatic hyperplasia    Refill provided      Relevant Medications   tamsulosin (FLOMAX) 0.4 MG CAPS capsule     Other   S/P discectomy    Using 2 oxycodone a day. Discussed and advised neurosurgery follow-up given numbness. Refill of oxycodone #15 to help with transition back home. HH PT planned. Will assist in coordination as needed.       Relevant Medications   oxyCODONE (OXY IR/ROXICODONE) 5 MG immediate release tablet    Other Visit Diagnoses    Hyperkalemia       Relevant Orders   Basic metabolic panel   Need for hepatitis C screening test       Relevant Orders   Hepatitis C antibody       Return in about 4 weeks (around 12/02/2018).  Lesleigh Noe, MD

## 2018-11-05 LAB — HEPATITIS C ANTIBODY
Hepatitis C Ab: NONREACTIVE
SIGNAL TO CUT-OFF: 0.01 (ref <1.00)

## 2018-11-06 ENCOUNTER — Ambulatory Visit: Payer: Medicare HMO | Admitting: Internal Medicine

## 2018-11-10 DIAGNOSIS — M4726 Other spondylosis with radiculopathy, lumbar region: Secondary | ICD-10-CM | POA: Diagnosis not present

## 2018-11-10 DIAGNOSIS — J439 Emphysema, unspecified: Secondary | ICD-10-CM | POA: Diagnosis not present

## 2018-11-10 DIAGNOSIS — M48061 Spinal stenosis, lumbar region without neurogenic claudication: Secondary | ICD-10-CM | POA: Diagnosis not present

## 2018-11-10 DIAGNOSIS — J9611 Chronic respiratory failure with hypoxia: Secondary | ICD-10-CM | POA: Diagnosis not present

## 2018-11-10 DIAGNOSIS — E1165 Type 2 diabetes mellitus with hyperglycemia: Secondary | ICD-10-CM | POA: Diagnosis not present

## 2018-11-10 DIAGNOSIS — E1151 Type 2 diabetes mellitus with diabetic peripheral angiopathy without gangrene: Secondary | ICD-10-CM | POA: Diagnosis not present

## 2018-11-10 DIAGNOSIS — Z4789 Encounter for other orthopedic aftercare: Secondary | ICD-10-CM | POA: Diagnosis not present

## 2018-11-10 DIAGNOSIS — Z981 Arthrodesis status: Secondary | ICD-10-CM | POA: Diagnosis not present

## 2018-11-10 DIAGNOSIS — I10 Essential (primary) hypertension: Secondary | ICD-10-CM | POA: Diagnosis not present

## 2018-11-10 DIAGNOSIS — I2510 Atherosclerotic heart disease of native coronary artery without angina pectoris: Secondary | ICD-10-CM | POA: Diagnosis not present

## 2018-11-13 ENCOUNTER — Telehealth: Payer: Self-pay | Admitting: *Deleted

## 2018-11-13 ENCOUNTER — Ambulatory Visit (INDEPENDENT_AMBULATORY_CARE_PROVIDER_SITE_OTHER): Payer: Medicare HMO | Admitting: Internal Medicine

## 2018-11-13 ENCOUNTER — Encounter: Payer: Self-pay | Admitting: Internal Medicine

## 2018-11-13 VITALS — BP 134/76 | HR 80 | Ht 69.0 in | Wt 297.8 lb

## 2018-11-13 DIAGNOSIS — I10 Essential (primary) hypertension: Secondary | ICD-10-CM | POA: Diagnosis not present

## 2018-11-13 DIAGNOSIS — M48061 Spinal stenosis, lumbar region without neurogenic claudication: Secondary | ICD-10-CM | POA: Diagnosis not present

## 2018-11-13 DIAGNOSIS — M4726 Other spondylosis with radiculopathy, lumbar region: Secondary | ICD-10-CM | POA: Diagnosis not present

## 2018-11-13 DIAGNOSIS — I2510 Atherosclerotic heart disease of native coronary artery without angina pectoris: Secondary | ICD-10-CM | POA: Diagnosis not present

## 2018-11-13 DIAGNOSIS — E1151 Type 2 diabetes mellitus with diabetic peripheral angiopathy without gangrene: Secondary | ICD-10-CM | POA: Diagnosis not present

## 2018-11-13 DIAGNOSIS — J449 Chronic obstructive pulmonary disease, unspecified: Secondary | ICD-10-CM | POA: Diagnosis not present

## 2018-11-13 DIAGNOSIS — R911 Solitary pulmonary nodule: Secondary | ICD-10-CM | POA: Diagnosis not present

## 2018-11-13 DIAGNOSIS — Z981 Arthrodesis status: Secondary | ICD-10-CM | POA: Diagnosis not present

## 2018-11-13 DIAGNOSIS — J439 Emphysema, unspecified: Secondary | ICD-10-CM | POA: Diagnosis not present

## 2018-11-13 DIAGNOSIS — E1165 Type 2 diabetes mellitus with hyperglycemia: Secondary | ICD-10-CM | POA: Diagnosis not present

## 2018-11-13 DIAGNOSIS — J9611 Chronic respiratory failure with hypoxia: Secondary | ICD-10-CM | POA: Diagnosis not present

## 2018-11-13 DIAGNOSIS — Z4789 Encounter for other orthopedic aftercare: Secondary | ICD-10-CM | POA: Diagnosis not present

## 2018-11-13 NOTE — Telephone Encounter (Signed)
Suezanne Jacquet PT with [facility] left a voicemail stating that he has been seeing patient for a back issue. Suezanne Jacquet stated while he has been seeing the patient he uncovered that patinet has a glucometer that he does not know how to use. Suezanne Jacquet stated that he is a diabetic and has no way of checking his blood sugar because he was never explained on how to use the machine. Suezanne Jacquet stated that they can not do this without an order. Suezanne Jacquet stated that patient may need an appointment to come in and have someone teach him how to check his sugars and use the machine. Suezanne Jacquet stated that patient also may need a diabetic diet and learn how to use it.  Suezanne Jacquet stated that if you have any questions to call him back.

## 2018-11-13 NOTE — Telephone Encounter (Signed)
CB number for Alfred Carney is [phone]. Please review on how to proceed. Thank you

## 2018-11-13 NOTE — Patient Instructions (Signed)
No change recommendations except I strongly recommend bed blocks x  6-8 inches    Please schedule a follow up visit in 3 months but call sooner if needed

## 2018-11-13 NOTE — Telephone Encounter (Signed)
Spoke with Licensed conveyancer at [employer]. Feel that home health evaluation/teaching is the best option. She will look into seeing if this can be done.   Pt currently only on oral diabetes medication. Likely only needs to check CBG occasionally.   Lesleigh Noe

## 2018-11-13 NOTE — Progress Notes (Signed)
Subjective:   Patient ID: Alfred Carney, male    DOB: 08-01-1951    MRN: [id number]   Brief patient profile:  6  yowm quit smoking 2008 with baseline weight of 240 when he stopped smoking in March of 2008 p pna with doe only some better then  worse since summer 2015 on so referred 05/17/2015 to pulmonary clinic by Dr Helane Rima for copd eval as had been seen here in 2012 with GOLD II criteria.     History of Present Illness 05/17/2015 1st [facility] office visit/ Trina Asch  / copd eval on ACEi  Chief Complaint  Patient presents with  . Pulmonary Consult    Referre by Dr. Helane Rima. Pt c/o SOB x 10 yrs, gradually worse over the past yr.  He states that he gets SOB with minimal exertion such as taking a shower or walking 49 yrds on flat surface.   indolent onset progressive doe but always ok at rest/  Sleeps in recliner x 10 years Has osa/ could not tol cpap and tends to fall asleep p eats Assoc with sense of nasal and throat congestion but very little cough - does have hb but controls with prn ppi Had been on several different inhalers including according to the records Spiriva but didn't think any of them really helped and stopped them years ago rec Stop lisinopril  Valsartan 80 mg one daily in place of lisinopril Prilosec Take 30- 60 min before your first and last meals of the day just until you return  GERD  Diet     06/28/2015  f/u ov/Malyia Moro re: GOLD II copd/ obesity Chief Complaint  Patient presents with  . Follow-up    PFT done today. Pt states his breathing is unchanged. He c/o cough for the past month- prod with min clear sputum.    Was some better until caught cold x 2 weeks p serving jury duty Has not tried sleeping s recliner  rec Stop hydrodiuril (thiazides contribute to gout)  Start lasix 20 mg daily as needed for swelling  - if you cut the sal out you may not need this  Symbicort 160 Take 2 puffs first thing in am and then another 2 puffs about 12 hours later.        11/19/2017  Extended f/u ov/[place] Kestner re-establish for  worse sob x 3 y  Chief Complaint  Patient presents with  . Follow-up    SOB that has not gotten any better since last OV .  Has a CPAP  but unable to tolerate .   indolent gradually worsening Dyspnea: 50 ft to car down 6 steps from the deck and struggles to back x 3-4 months Cough: none Sleep: poor/ restlessness/ recliner but barely elevated / has cpap but can't tol at all  SABA use:  Not using one now  Not using prilosec regularly  rec stiolto 2 pffs each am  Wear 02 2lpm 24/7 for now Please remember to go to the lab and x-ray department downstairs in the basement  for your tests - we will call you with the results when they are available.   Please schedule a follow up office visit in 6 weeks, call sooner if needed with all medications /inhalers/ solutions in hand so we can verify exactly what you are taking. This includes all medications from all doctors and over the counters  - full pfts on return    01/05/2018  f/u ov/Ilena Dieckman re:   COPD II 02 2  lpm - no better with stiolto so did not fill  / did not bring meds  Chief Complaint  Patient presents with  . Follow-up    PFT's done today.  Breathing is unchanged since the last visit.    Dyspnea:  MMRC3 = can't walk 100 yards even at a slow pace at a flat grade s stopping due to sob   Cough: none Sleep: recliner < 30 degrees due to back and breathing SABA use:  Not helping  Overt hb on ppi bid ac > for EGD by Dr Nyoka Cowden rec Only use your Albuterol as rescue  Please see patient coordinator before you leave today  to schedule pulmonary rehab at [facility]> done        02/02/2018  f/u ov/Jasher Barkan re:  Copd II/ 02 2lpm  Chief Complaint  Patient presents with  . Follow-up    Breathing is about the same. He has good and bad days. He is using his ventolin inhaler 1-2 x per wk on average.    Dyspnea:  MMRC3 = can't walk 100 yards even at a slow pace at a flat grade s stopping due to sob    4 min x  .28 miles /level  Stopped half way due to legs  Cough: none Sleep: recliner due to back > breathing rec Omeprazole 40 mg Take 30-60 min before first meal of the day  Add anoro one click each am - fill it if you feel it improves your activity tolerance especially at rehab       05/06/2018  f/u ov/Ean Gettel re:  Copd II/  2lpm ex up to 4lpm was doing fine with rehab until fell/ now in w/c/ no better on anoro so stopped it  Chief Complaint  Patient presents with  . Follow-up    c/o stable sob with exertion.    Dyspnea:  15 min on treadmill @ 4lpm @ 0.8 up to 1.0 flat then fell 04/27/18  Cough: no Sleeping: recliner 30 degrees/  SABA use: not really using much, no better on anoro so stopped 02: 2lpm   rec The goal is to  Keep the saturations above 90% when you exercise  Have your pcp refer you to orthopedics if needed  Keep the legs elevated at night as much as you can and consider wearing elastic knee highs during the day     08/06/2018  f/u ov/Ronit Cranfield re:  COPD II/ 02 dep with MO / spn LLL  Chief Complaint  Patient presents with  . Follow-up    headache and head cold for 5 days and "knots in scalp" (saw his PCP).  breathing seems to be better.  O2 at 2L  Dyspnea:  20 min @ l mph flat grade on 3lpm at [employer]  Cough: assoc with nasal congestion/ clear mucus Sleeping: recliner  At 30 degrees  SABA use: none 02:  2lpm / 3lpm with exertion   rec Call your dermatologist today for evaluation of your scalp Please see patient coordinator before you leave today  to schedule PET scan and I'll call the results  Resume PT at [facility]  For cough/ congestion > mucinex dm up 1200 mg every 12 hours as needed    10/21/2018 admit with L Hip pain > 2 discectomy    11/13/2018  f/u ov/Jasson Siegmann re:   Copd II/ 02 dep/ MO Chief Complaint  Patient presents with  . Follow-up    follows for COPD. breathing doing well. just had back surgery.  Dyspnea:  More slowed by back /L hip pain   Cough: none Sleeping: on back flat bed  One pillow SABA use: none  02: 2lpm 24/7    No obvious day to day or daytime variability or assoc excess/ purulent sputum or mucus plugs or hemoptysis or cp or chest tightness, subjective wheeze or overt sinus or hb symptoms.   Sleeping as above without nocturnal  or early am exacerbation  of respiratory  c/o's or need for noct saba. Also denies any obvious fluctuation of symptoms with weather or environmental changes or other aggravating or alleviating factors except as outlined above   No unusual exposure hx or h/o childhood pna/ asthma or knowledge of premature birth.  Current Allergies, Complete Past Medical History, Past Surgical History, Family History, and Social History were reviewed in [employer] record.  ROS  The following are not active complaints unless bolded Hoarseness, sore throat, dysphagia, dental problems, itching, sneezing,  nasal congestion or discharge of excess mucus or purulent secretions, ear ache,   fever, chills, sweats, unintended wt loss or wt gain, classically pleuritic or exertional cp,  orthopnea pnd or arm/hand swelling  or leg swelling, presyncope, palpitations, abdominal pain, anorexia, nausea, vomiting, diarrhea  or change in bowel habits or change in bladder habits, change in stools or change in urine, dysuria, hematuria,  rash, arthralgias, visual complaints, headache, numbness, weakness or ataxia or problems with walking or coordination,  change in mood or  memory.        Current Meds  Medication Sig  . acetaminophen (TYLENOL) 325 MG tablet Take 2 tablets (650 mg total) by mouth every 6 (six) hours.  Marland Kitchen albuterol (PROVENTIL HFA;VENTOLIN HFA) 108 (90 Base) MCG/ACT inhaler Inhale 2 puffs into the lungs every 6 (six) hours as needed for wheezing or shortness of breath.  Marland Kitchen amLODipine (NORVASC) 10 MG tablet Take 1 tablet (10 mg total) by mouth at bedtime.  . Calcium Carbonate-Simethicone  (ALKA-SELTZER HEARTBURN + GAS) 750-80 MG CHEW Chew 1 tablet by mouth as needed (gas).   . Cholecalciferol (VITAMIN D PO) Take 1 capsule by mouth daily.   Marland Kitchen dexamethasone (DECADRON) 2 MG tablet Take 1 tablet (2 mg total) by mouth every other day.  . fluticasone (FLONASE) 50 MCG/ACT nasal spray Place 2 sprays into both nostrils daily. (Patient taking differently: Place 2 sprays into both nostrils as needed for allergies or rhinitis. )  . furosemide (LASIX) 20 MG tablet Take 1 tablet (20 mg total) by mouth daily as needed. (Patient taking differently: Take 20 mg by mouth daily. )  . gabapentin (NEURONTIN) 300 MG capsule Take 1 capsule (300 mg total) by mouth 3 (three) times daily.  Marland Kitchen glipiZIDE (GLUCOTROL) 5 MG tablet Take 5 mg (1 tablet) before breakfast. Then after 7 days increase to 10 mg (2 tablet) before breakfast.  . ketoconazole (NIZORAL) 2 % shampoo Apply 1 application topically as needed for irritation.  . metoprolol succinate (TOPROL-XL) 25 MG 24 hr tablet Take 25 mg by mouth daily.  . nitroGLYCERIN (NITROSTAT) 0.4 MG SL tablet Place 1 tablet (0.4 mg total) under the tongue every 5 (five) minutes x 3 doses as needed for chest pain.  Marland Kitchen omeprazole (PRILOSEC) 40 MG capsule Take 1 capsule (40 mg total) by mouth daily.  Marland Kitchen oxyCODONE (OXY IR/ROXICODONE) 5 MG immediate release tablet Take 1 tablet (5 mg total) by mouth every 6 (six) hours as needed for breakthrough pain.  . OXYGEN Inhale 2 L into the lungs continuous.   Marland Kitchen  rosuvastatin (CRESTOR) 10 MG tablet Take 10 mg by mouth at bedtime.   . senna-docusate (SENOKOT-S) 8.6-50 MG tablet Take 2 tablets by mouth 2 (two) times daily.  . tamsulosin (FLOMAX) 0.4 MG CAPS capsule Take 1 capsule (0.4 mg total) by mouth at bedtime.                Objective:   Physical Exam  06/28/2015        326 > 08/15/2015  323 >  11/19/2017  326 >  01/05/2018 320 > 02/02/2018    324 >  05/06/2018  312 >  08/06/2018  317 > 11/13/2018   297     05/17/15 329 lb (149.233  kg)  01/19/15 322 lb (146.058 kg)  01/18/15 319 lb (144.697 kg)    Vital signs reviewed - Note on arrival 02 sats  95% on RA         HEENT:  Full dentures . Nl  turbinates bilaterally, and oropharynx. Nl external ear canals without cough reflex   NECK :  without JVD/Nodes/TM/ nl carotid upstrokes bilaterally   LUNGS: no acc muscle use,  Nl contour chest which is clear to A and P bilaterally without cough on insp or exp maneuvers   CV:  RRR  no s3 or murmur or increase in P2, and Pos mild  pitting edema L >R LE   ABD:  Quite obese/ nontender with nl inspiratory excursion in the supine position. No bruits or organomegaly appreciated, bowel sounds nl  MS:  Nl gait/ ext warm without deformities, calf tenderness, cyanosis or clubbing No obvious joint restrictions   SKIN: warm and dry without lesions    NEURO:  alert, approp, nl sensorium with  no motor or cerebellar deficits apparent.           Assessment & Plan:

## 2018-11-14 ENCOUNTER — Encounter: Payer: Self-pay | Admitting: Internal Medicine

## 2018-11-14 NOTE — Assessment & Plan Note (Signed)
CT chest 01/28/18  New 13 mm nodule at the left lung base  CT Chest 08/05/2018 > The 1.3 cm left lower lobe lung nodule is unchanged from 01/28/2018. This has increased in size when compared with 01/17/2016 and is new when compared with 01/28/2014. Slow growing, indolent neoplasm cannot be excluded. PET  08/17/2018 >>>1.3 cm left lower lobe pulmonary nodule shows no hypermetabolism above background blood pool levels > rec 6 m Ct  Chest for 02/16/19>>   in reminder file   Discussed in detail all the  indications, usual  risks and alternatives  relative to the benefits with patient who agrees to proceed with w/u as outlined.

## 2018-11-14 NOTE — Assessment & Plan Note (Signed)
11/19/2017  sats 81% at rest in a chronic stable state so rec 2lpm 24/7  - HC03  11/04/18 = 33   He may be developing hypercarbic component more likely ohs than copd related but could also be due to post op lact of mobility/ narcotic use > will trend for now

## 2018-11-14 NOTE — Assessment & Plan Note (Signed)
Quit smoking 2008  - 2012 PFT's FEV1 of 54% predicted with a ratio of 45%, consistent with moderate to severe COPD with mild inspiratory truncation. - 05/17/2015  Walked RA  2 laps @ 185 ft each stopped due to  Sob/ desat at nl pace to 83%   - trial off acei 05/17/2015  - PFT's  06/28/2015  FEV1 1.67 (51 % ) ratio 55  p 13 % improvement from saba with DLCO  47 % corrects to 66 % for alv volume  - rec trial of symbicort 06/28/15 > coughing / breathing worse so stopped it   - 08/15/2015  Walked RA x 3 laps @ 185 ft each stopped due to End of study, nl pace, min sob/  desat  To 88%  - sats 81% at rest and not acutely ill 11/19/2017 > placed on 2lpm (see separate a/p)  - Spirometry 11/19/2017  FEV1 1.60 (49%)  Ratio 59 with classic curvature - 11/19/2017  After extensive coaching inhaler device  effectiveness =    90% with smi > trial of stiolto   PFT's  01/05/2018  FEV1 1.66 (52 % ) ratio 60  p 6 % improvement from saba p 6 prior to study with DLCO  38 % corrects to 56  % for alv volume   - 01/05/2018  Referred to rehab [facility] - Anoro trial 02/02/2018 > not better so d/c'd   On no maint rx, slowed down more by back/ L leg pain than sob at this point > continue pulmonary  rehab when feasible.

## 2018-11-14 NOTE — Assessment & Plan Note (Signed)
pfts 06/28/2015 with restrictive component with erv 41%   Body mass index is 43.98 kg/m.  -  trending down, encouraged Lab Results  Component Value Date   TSH 3.74 11/19/2017     Contributing to gerd risk/ doe/reviewed the need and the process to achieve and maintain neg calorie balance > defer f/u primary care including intermittently monitoring thyroid status     I had an extended discussion with the patient and wife reviewing all relevant studies completed to date and  lasting 15 to 20 minutes of a 25 minute post hosp f/u office visit    Each maintenance medication was reviewed in detail including most importantly the difference between maintenance and prns and under what circumstances the prns are to be triggered using an action plan format that is not reflected in the computer generated alphabetically organized AVS.     Please see AVS for specific instructions unique to this visit that I personally wrote and verbalized to the the pt in detail and then reviewed with pt  by my nurse highlighting any  changes in therapy recommended at today's visit to their plan of care.

## 2018-11-17 NOTE — Telephone Encounter (Signed)
Spoke with Museum/gallery conservator PT ([place]) and gave verbal orders to move forward with [facility] [place] support for diabetes education and BS meter training and support.   Patient should be checking sugars at least 2 times daily initially and as needed any time he isn't feeling right..  Would recommend he start with Am Fasting check daily and for 2nd check alternate checking post prandial after lunch with post prandial after dinner checks.  He can also add in a bedtime check  (at least 2 hours pp) on occasion to give [name] some idea of how pm sugars are running.   Amber will communicate all instructions to patient and nursing team.  [facility] to fax over official paperwork for Dr. Einar Pheasant to sign off and they will initiate in home nursing support.    FYI to Dr. Einar Pheasant to be on alert for paperwork.

## 2018-11-17 NOTE — Telephone Encounter (Signed)
Amber PT with [facility] left v/m;has been following pt after back surgery. Pt has a glucometer by another provider but pt does not ck BS due to not knowing how to use glucometer. Amber request cb about how often pt should ck blood sugar or if pt should ck sugar. Amber request cb.

## 2018-11-19 ENCOUNTER — Ambulatory Visit: Payer: Medicare HMO | Admitting: Family Medicine

## 2018-11-19 DIAGNOSIS — Z981 Arthrodesis status: Secondary | ICD-10-CM | POA: Diagnosis not present

## 2018-11-19 DIAGNOSIS — M4726 Other spondylosis with radiculopathy, lumbar region: Secondary | ICD-10-CM | POA: Diagnosis not present

## 2018-11-19 DIAGNOSIS — I10 Essential (primary) hypertension: Secondary | ICD-10-CM | POA: Diagnosis not present

## 2018-11-19 DIAGNOSIS — J439 Emphysema, unspecified: Secondary | ICD-10-CM | POA: Diagnosis not present

## 2018-11-19 DIAGNOSIS — E1165 Type 2 diabetes mellitus with hyperglycemia: Secondary | ICD-10-CM | POA: Diagnosis not present

## 2018-11-19 DIAGNOSIS — M48061 Spinal stenosis, lumbar region without neurogenic claudication: Secondary | ICD-10-CM | POA: Diagnosis not present

## 2018-11-19 DIAGNOSIS — J9611 Chronic respiratory failure with hypoxia: Secondary | ICD-10-CM | POA: Diagnosis not present

## 2018-11-19 DIAGNOSIS — I2510 Atherosclerotic heart disease of native coronary artery without angina pectoris: Secondary | ICD-10-CM | POA: Diagnosis not present

## 2018-11-19 DIAGNOSIS — E1151 Type 2 diabetes mellitus with diabetic peripheral angiopathy without gangrene: Secondary | ICD-10-CM | POA: Diagnosis not present

## 2018-11-19 DIAGNOSIS — Z4789 Encounter for other orthopedic aftercare: Secondary | ICD-10-CM | POA: Diagnosis not present

## 2018-11-20 DIAGNOSIS — J439 Emphysema, unspecified: Secondary | ICD-10-CM | POA: Diagnosis not present

## 2018-11-20 DIAGNOSIS — I2510 Atherosclerotic heart disease of native coronary artery without angina pectoris: Secondary | ICD-10-CM | POA: Diagnosis not present

## 2018-11-20 DIAGNOSIS — E1165 Type 2 diabetes mellitus with hyperglycemia: Secondary | ICD-10-CM | POA: Diagnosis not present

## 2018-11-20 DIAGNOSIS — Z4789 Encounter for other orthopedic aftercare: Secondary | ICD-10-CM | POA: Diagnosis not present

## 2018-11-20 DIAGNOSIS — M4726 Other spondylosis with radiculopathy, lumbar region: Secondary | ICD-10-CM | POA: Diagnosis not present

## 2018-11-20 DIAGNOSIS — J9611 Chronic respiratory failure with hypoxia: Secondary | ICD-10-CM | POA: Diagnosis not present

## 2018-11-20 DIAGNOSIS — M48061 Spinal stenosis, lumbar region without neurogenic claudication: Secondary | ICD-10-CM | POA: Diagnosis not present

## 2018-11-20 DIAGNOSIS — I10 Essential (primary) hypertension: Secondary | ICD-10-CM | POA: Diagnosis not present

## 2018-11-20 DIAGNOSIS — E1151 Type 2 diabetes mellitus with diabetic peripheral angiopathy without gangrene: Secondary | ICD-10-CM | POA: Diagnosis not present

## 2018-11-20 DIAGNOSIS — Z981 Arthrodesis status: Secondary | ICD-10-CM | POA: Diagnosis not present

## 2018-11-21 DIAGNOSIS — J449 Chronic obstructive pulmonary disease, unspecified: Secondary | ICD-10-CM | POA: Diagnosis not present

## 2018-11-21 DIAGNOSIS — R062 Wheezing: Secondary | ICD-10-CM | POA: Diagnosis not present

## 2018-11-23 ENCOUNTER — Other Ambulatory Visit: Payer: Self-pay

## 2018-11-23 DIAGNOSIS — Z981 Arthrodesis status: Secondary | ICD-10-CM | POA: Diagnosis not present

## 2018-11-23 DIAGNOSIS — I10 Essential (primary) hypertension: Secondary | ICD-10-CM | POA: Diagnosis not present

## 2018-11-23 DIAGNOSIS — E1151 Type 2 diabetes mellitus with diabetic peripheral angiopathy without gangrene: Secondary | ICD-10-CM | POA: Diagnosis not present

## 2018-11-23 DIAGNOSIS — Z4789 Encounter for other orthopedic aftercare: Secondary | ICD-10-CM | POA: Diagnosis not present

## 2018-11-23 DIAGNOSIS — M48061 Spinal stenosis, lumbar region without neurogenic claudication: Secondary | ICD-10-CM | POA: Diagnosis not present

## 2018-11-23 DIAGNOSIS — J9611 Chronic respiratory failure with hypoxia: Secondary | ICD-10-CM | POA: Diagnosis not present

## 2018-11-23 DIAGNOSIS — E1165 Type 2 diabetes mellitus with hyperglycemia: Secondary | ICD-10-CM | POA: Diagnosis not present

## 2018-11-23 DIAGNOSIS — J439 Emphysema, unspecified: Secondary | ICD-10-CM | POA: Diagnosis not present

## 2018-11-23 DIAGNOSIS — I2510 Atherosclerotic heart disease of native coronary artery without angina pectoris: Secondary | ICD-10-CM | POA: Diagnosis not present

## 2018-11-23 DIAGNOSIS — M4726 Other spondylosis with radiculopathy, lumbar region: Secondary | ICD-10-CM | POA: Diagnosis not present

## 2018-11-23 MED ORDER — METOPROLOL SUCCINATE ER 25 MG PO TB24: 25.0000 mg | ORAL_TABLET | Freq: Every day | ORAL | 3 refills | Status: DC

## 2018-11-23 NOTE — Telephone Encounter (Signed)
Refill request from [facility] for Metoprolol ER 25 mg 1 tablet daily. Last filled by previous PCP- Dr. Lavone Neri. Dr. Einar Pheasant has not filled this medication yet. Sending for Dr. Einar Pheasant to review.

## 2018-11-24 ENCOUNTER — Telehealth: Payer: Self-pay

## 2018-11-24 NOTE — Telephone Encounter (Signed)
Angel nurse with [facility] left v/m requesting verbal orders for [facility] nursing 2 x a wk for 2 wks;and one visit as needed. Angel did educate pt on how to use glucose monitor today.Please advise.

## 2018-11-24 NOTE — Telephone Encounter (Signed)
[place] for [place] 2 x a wk for 2 wks;and one visit as needed  Lesleigh Noe

## 2018-11-25 NOTE — Telephone Encounter (Signed)
Alfred Carney advised in details on her private voicemail.

## 2018-11-26 DIAGNOSIS — J439 Emphysema, unspecified: Secondary | ICD-10-CM | POA: Diagnosis not present

## 2018-11-26 DIAGNOSIS — I10 Essential (primary) hypertension: Secondary | ICD-10-CM | POA: Diagnosis not present

## 2018-11-26 DIAGNOSIS — I2510 Atherosclerotic heart disease of native coronary artery without angina pectoris: Secondary | ICD-10-CM | POA: Diagnosis not present

## 2018-11-26 DIAGNOSIS — J9611 Chronic respiratory failure with hypoxia: Secondary | ICD-10-CM | POA: Diagnosis not present

## 2018-11-26 DIAGNOSIS — Z981 Arthrodesis status: Secondary | ICD-10-CM | POA: Diagnosis not present

## 2018-11-26 DIAGNOSIS — Z4789 Encounter for other orthopedic aftercare: Secondary | ICD-10-CM | POA: Diagnosis not present

## 2018-11-26 DIAGNOSIS — E1165 Type 2 diabetes mellitus with hyperglycemia: Secondary | ICD-10-CM | POA: Diagnosis not present

## 2018-11-26 DIAGNOSIS — M48061 Spinal stenosis, lumbar region without neurogenic claudication: Secondary | ICD-10-CM | POA: Diagnosis not present

## 2018-11-26 DIAGNOSIS — E1151 Type 2 diabetes mellitus with diabetic peripheral angiopathy without gangrene: Secondary | ICD-10-CM | POA: Diagnosis not present

## 2018-11-26 DIAGNOSIS — M4726 Other spondylosis with radiculopathy, lumbar region: Secondary | ICD-10-CM | POA: Diagnosis not present

## 2018-12-01 DIAGNOSIS — Z4789 Encounter for other orthopedic aftercare: Secondary | ICD-10-CM | POA: Diagnosis not present

## 2018-12-01 DIAGNOSIS — J439 Emphysema, unspecified: Secondary | ICD-10-CM | POA: Diagnosis not present

## 2018-12-01 DIAGNOSIS — M4726 Other spondylosis with radiculopathy, lumbar region: Secondary | ICD-10-CM | POA: Diagnosis not present

## 2018-12-01 DIAGNOSIS — E1151 Type 2 diabetes mellitus with diabetic peripheral angiopathy without gangrene: Secondary | ICD-10-CM | POA: Diagnosis not present

## 2018-12-01 DIAGNOSIS — I2510 Atherosclerotic heart disease of native coronary artery without angina pectoris: Secondary | ICD-10-CM | POA: Diagnosis not present

## 2018-12-01 DIAGNOSIS — Z981 Arthrodesis status: Secondary | ICD-10-CM | POA: Diagnosis not present

## 2018-12-01 DIAGNOSIS — M48061 Spinal stenosis, lumbar region without neurogenic claudication: Secondary | ICD-10-CM | POA: Diagnosis not present

## 2018-12-01 DIAGNOSIS — I10 Essential (primary) hypertension: Secondary | ICD-10-CM | POA: Diagnosis not present

## 2018-12-01 DIAGNOSIS — J9611 Chronic respiratory failure with hypoxia: Secondary | ICD-10-CM | POA: Diagnosis not present

## 2018-12-01 DIAGNOSIS — E1165 Type 2 diabetes mellitus with hyperglycemia: Secondary | ICD-10-CM | POA: Diagnosis not present

## 2018-12-02 ENCOUNTER — Ambulatory Visit: Payer: Medicare HMO | Admitting: Family Medicine

## 2018-12-03 DIAGNOSIS — J439 Emphysema, unspecified: Secondary | ICD-10-CM | POA: Diagnosis not present

## 2018-12-03 DIAGNOSIS — I2510 Atherosclerotic heart disease of native coronary artery without angina pectoris: Secondary | ICD-10-CM | POA: Diagnosis not present

## 2018-12-03 DIAGNOSIS — M4726 Other spondylosis with radiculopathy, lumbar region: Secondary | ICD-10-CM | POA: Diagnosis not present

## 2018-12-03 DIAGNOSIS — M48061 Spinal stenosis, lumbar region without neurogenic claudication: Secondary | ICD-10-CM | POA: Diagnosis not present

## 2018-12-03 DIAGNOSIS — E1165 Type 2 diabetes mellitus with hyperglycemia: Secondary | ICD-10-CM | POA: Diagnosis not present

## 2018-12-03 DIAGNOSIS — E1151 Type 2 diabetes mellitus with diabetic peripheral angiopathy without gangrene: Secondary | ICD-10-CM | POA: Diagnosis not present

## 2018-12-03 DIAGNOSIS — I10 Essential (primary) hypertension: Secondary | ICD-10-CM | POA: Diagnosis not present

## 2018-12-03 DIAGNOSIS — J9611 Chronic respiratory failure with hypoxia: Secondary | ICD-10-CM | POA: Diagnosis not present

## 2018-12-03 DIAGNOSIS — Z4789 Encounter for other orthopedic aftercare: Secondary | ICD-10-CM | POA: Diagnosis not present

## 2018-12-03 DIAGNOSIS — Z981 Arthrodesis status: Secondary | ICD-10-CM | POA: Diagnosis not present

## 2018-12-14 ENCOUNTER — Other Ambulatory Visit: Payer: Self-pay

## 2018-12-14 DIAGNOSIS — E1122 Type 2 diabetes mellitus with diabetic chronic kidney disease: Secondary | ICD-10-CM

## 2018-12-14 DIAGNOSIS — N182 Chronic kidney disease, stage 2 (mild): Principal | ICD-10-CM

## 2018-12-14 MED ORDER — GLIPIZIDE 5 MG PO TABS: 5.0000 mg | ORAL_TABLET | Freq: Two times a day (BID) | ORAL | 0 refills | Status: DC

## 2018-12-14 NOTE — Telephone Encounter (Signed)
Patient requests 90 day supply for Glipizide, per fax from [facility]

## 2018-12-21 ENCOUNTER — Other Ambulatory Visit: Payer: Self-pay | Admitting: *Deleted

## 2018-12-21 MED ORDER — FUROSEMIDE 20 MG PO TABS: 20.0000 mg | ORAL_TABLET | Freq: Every day | ORAL | 1 refills | Status: DC | PRN

## 2018-12-22 DIAGNOSIS — J449 Chronic obstructive pulmonary disease, unspecified: Secondary | ICD-10-CM | POA: Diagnosis not present

## 2018-12-22 DIAGNOSIS — R062 Wheezing: Secondary | ICD-10-CM | POA: Diagnosis not present

## 2019-01-04 ENCOUNTER — Encounter: Payer: Self-pay | Admitting: Family Medicine

## 2019-01-04 ENCOUNTER — Ambulatory Visit (INDEPENDENT_AMBULATORY_CARE_PROVIDER_SITE_OTHER): Payer: Medicare HMO | Admitting: Family Medicine

## 2019-01-04 VITALS — BP 128/76 | Ht 69.0 in | Wt 305.0 lb

## 2019-01-04 DIAGNOSIS — E1122 Type 2 diabetes mellitus with diabetic chronic kidney disease: Secondary | ICD-10-CM | POA: Diagnosis not present

## 2019-01-04 DIAGNOSIS — I10 Essential (primary) hypertension: Secondary | ICD-10-CM | POA: Diagnosis not present

## 2019-01-04 DIAGNOSIS — Z9889 Other specified postprocedural states: Secondary | ICD-10-CM

## 2019-01-04 DIAGNOSIS — E7800 Pure hypercholesterolemia, unspecified: Secondary | ICD-10-CM | POA: Diagnosis not present

## 2019-01-04 DIAGNOSIS — N182 Chronic kidney disease, stage 2 (mild): Secondary | ICD-10-CM | POA: Diagnosis not present

## 2019-01-04 MED ORDER — ROSUVASTATIN CALCIUM 10 MG PO TABS: 10.0000 mg | ORAL_TABLET | Freq: Every day | ORAL | 3 refills | Status: DC

## 2019-01-04 MED ORDER — NITROGLYCERIN 0.4 MG SL SUBL: 0.4000 mg | SUBLINGUAL_TABLET | SUBLINGUAL | 0 refills | Status: DC | PRN

## 2019-01-04 NOTE — Progress Notes (Signed)
Virtual Visit via Telephone Note  I connected with Alfred Carney on 01/04/19 at 11:20 AM EDT by telephone and verified that I am speaking with the correct person using two identifiers.   I discussed the limitations, risks, security and privacy concerns of performing an evaluation and management service by telephone and the availability of in person appointments. I also discussed with the patient that there may be a patient responsible charge related to this service. The patient expressed understanding and agreed to proceed.  Patient location: Home Provider Location: [place] Participants: Lesleigh Noe and Alfred Carney   History of Present Illness: Chief Complaint  Patient presents with  . Hypertension    Pt does not check BP every day. His recent readings have been 128/76, 140/80, 127/74   #HTN - taking medication - does occasionally have some high, but mostly well control - no sob, cp - does have copd - so that is typically the cause of sob - getting bored with staying inside  #Post-op pain - continues to have some pain - taking tylenol and occasional Oxycodone - not running out of medication   #Diabetes - checks sugars at home has been around 140-160 - did have a few low numbers - tried metformin but had bad diarrhea and could not tolerate   Observations/Objective: Sounds well on the phone NAD Breathing comfortably  Assessment and Plan: Problem List Items Addressed This Visit      Cardiovascular and Mediastinum   Essential hypertension   Relevant Medications   nitroGLYCERIN (NITROSTAT) 0.4 MG SL tablet   rosuvastatin (CRESTOR) 10 MG tablet     Endocrine   Type 2 diabetes mellitus with stage 2 chronic kidney disease, without long-term current use of insulin (HCC)   Relevant Medications   rosuvastatin (CRESTOR) 10 MG tablet     Other   Hyperlipidemia - Primary   Relevant Medications   nitroGLYCERIN (NITROSTAT) 0.4 MG SL tablet   rosuvastatin  (CRESTOR) 10 MG tablet   S/P discectomy    Currently managing pain. Will update me if worsens or having more issues.           Follow Up Instructions:  Return in about 6 weeks (around 02/15/2019) for diabetes w/ hgbA1c.   I discussed the assessment and treatment plan with the patient. The patient was provided an opportunity to ask questions and all were answered. The patient agreed with the plan and demonstrated an understanding of the instructions.   The patient was advised to call back or seek an in-person evaluation if the symptoms worsen or if the condition fails to improve as anticipated.  I provided 8 minutes of non-face-to-face time during this encounter.  Start: 10:38 am Stop: 10:46 am Lesleigh Noe, MD

## 2019-01-04 NOTE — Assessment & Plan Note (Signed)
Currently managing pain. Will update me if worsens or having more issues.

## 2019-01-15 ENCOUNTER — Other Ambulatory Visit: Payer: Self-pay | Admitting: Internal Medicine

## 2019-01-15 DIAGNOSIS — R911 Solitary pulmonary nodule: Secondary | ICD-10-CM

## 2019-01-21 DIAGNOSIS — J449 Chronic obstructive pulmonary disease, unspecified: Secondary | ICD-10-CM | POA: Diagnosis not present

## 2019-01-21 DIAGNOSIS — R062 Wheezing: Secondary | ICD-10-CM | POA: Diagnosis not present

## 2019-01-27 ENCOUNTER — Encounter: Payer: Self-pay | Admitting: Gastroenterology

## 2019-02-02 ENCOUNTER — Encounter: Payer: Self-pay | Admitting: Internal Medicine

## 2019-02-02 ENCOUNTER — Ambulatory Visit: Payer: Medicare HMO | Admitting: Internal Medicine

## 2019-02-02 ENCOUNTER — Other Ambulatory Visit: Payer: Self-pay

## 2019-02-02 DIAGNOSIS — R911 Solitary pulmonary nodule: Secondary | ICD-10-CM

## 2019-02-02 DIAGNOSIS — J9611 Chronic respiratory failure with hypoxia: Secondary | ICD-10-CM

## 2019-02-02 DIAGNOSIS — J449 Chronic obstructive pulmonary disease, unspecified: Secondary | ICD-10-CM | POA: Diagnosis not present

## 2019-02-02 DIAGNOSIS — J9612 Chronic respiratory failure with hypercapnia: Secondary | ICD-10-CM

## 2019-02-02 NOTE — Progress Notes (Signed)
Subjective:   Patient ID: Alfred Carney, male    DOB: [birth year]    MRN: [id number]   Brief patient profile:  30  yowm quit smoking 2008 with baseline weight of 240 when he stopped smoking in March of 2008 p pna with doe only some better then  worse since summer 2015 on so referred 05/17/2015 to pulmonary clinic by Dr Helane Rima for copd eval as had been seen here in 2012 with GOLD II criteria.     History of Present Illness 05/17/2015 1st [facility] office visit/ Maverick Dieudonne  / copd eval on ACEi  Chief Complaint  Patient presents with  . Pulmonary Consult    Referre by Dr. Helane Rima. Pt c/o SOB x 10 yrs, gradually worse over the past yr.  He states that he gets SOB with minimal exertion such as taking a shower or walking 50 yrds on flat surface.   indolent onset progressive doe but always ok at rest/  Sleeps in recliner x 10 years Has osa/ could not tol cpap and tends to fall asleep p eats Assoc with sense of nasal and throat congestion but very little cough - does have hb but controls with prn ppi Had been on several different inhalers including according to the records Spiriva but didn't think any of them really helped and stopped them years ago rec Stop lisinopril  Valsartan 80 mg one daily in place of lisinopril Prilosec Take 30- 60 min before your first and last meals of the day just until you return  GERD  Diet     06/28/2015  f/u ov/Aylani Spurlock re: GOLD II copd/ obesity Chief Complaint  Patient presents with  . Follow-up    PFT done today. Pt states his breathing is unchanged. He c/o cough for the past month- prod with min clear sputum.    Was some better until caught cold x 2 weeks p serving jury duty Has not tried sleeping s recliner  rec Stop hydrodiuril (thiazides contribute to gout)  Start lasix 20 mg daily as needed for swelling  - if you cut the sal out you may not need this  Symbicort 160 Take 2 puffs first thing in am and then another 2 puffs about 12 hours later.        11/19/2017  Extended f/u ov/Sable Knoles re-establish for  worse sob x 3 y  Chief Complaint  Patient presents with  . Follow-up    SOB that has not gotten any better since last OV .  Has a CPAP  but unable to tolerate .   indolent gradually worsening Dyspnea: 50 ft to car down 6 steps from the deck and struggles to back x 3-4 months Cough: none Sleep: poor/ restlessness/ recliner but barely elevated / has cpap but can't tol at all  SABA use:  Not using one now  Not using prilosec regularly  rec stiolto 2 pffs each am  Wear 02 2lpm 24/7 for now Please remember to go to the lab and x-ray department downstairs in the basement  for your tests - we will call you with the results when they are available.   Please schedule a follow up office visit in 6 weeks, call sooner if needed with all medications /inhalers/ solutions in hand so we can verify exactly what you are taking. This includes all medications from all doctors and over the counters  - full pfts on return    01/05/2018  f/u ov/Harshan Kearley re:   COPD II 02 2  lpm - no better with stiolto so did not fill  / did not bring meds  Chief Complaint  Patient presents with  . Follow-up    PFT's done today.  Breathing is unchanged since the last visit.    Dyspnea:  MMRC3 = can't walk 100 yards even at a slow pace at a flat grade s stopping due to sob   Cough: none Sleep: recliner < 30 degrees due to back and breathing SABA use:  Not helping  Overt hb on ppi bid ac > for EGD by Dr Nyoka Cowden rec Only use your Albuterol as rescue  Please see patient coordinator before you leave today  to schedule pulmonary rehab at [facility]> done        02/02/2018  f/u ov/Finnley Larusso re:  Copd II/ 02 2lpm  Chief Complaint  Patient presents with  . Follow-up    Breathing is about the same. He has good and bad days. He is using his ventolin inhaler 1-2 x per wk on average.    Dyspnea:  MMRC3 = can't walk 100 yards even at a slow pace at a flat grade s stopping due to sob   4 min x  .28 miles /level  Stopped half way due to legs  Cough: none Sleep: recliner due to back > breathing rec Omeprazole 40 mg Take 30-60 min before first meal of the day  Add anoro one click each am - fill it if you feel it improves your activity tolerance especially at rehab       05/06/2018  f/u ov/Jalena Vanderlinden re:  Copd II/  2lpm ex up to 4lpm was doing fine with rehab until fell/ now in w/c/ no better on anoro so stopped it  Chief Complaint  Patient presents with  . Follow-up    c/o stable sob with exertion.    Dyspnea:  15 min on treadmill @ 4lpm @ 0.8 up to 1.0 flat then fell 04/27/18  Cough: no Sleeping: recliner 30 degrees/  SABA use: not really using much, no better on anoro so stopped 02: 2lpm   rec The goal is to  Keep the saturations above 90% when you exercise  Have your pcp refer you to orthopedics if needed  Keep the legs elevated at night as much as you can and consider wearing elastic knee highs during the day     08/06/2018  f/u ov/Jimmy Stipes re:  COPD II/ 02 dep with MO / spn LLL  Chief Complaint  Patient presents with  . Follow-up    headache and head cold for 5 days and "knots in scalp" (saw his PCP).  breathing seems to be better.  O2 at 2L  Dyspnea:  20 min @ l mph flat grade on 3lpm at [employer]  Cough: assoc with nasal congestion/ clear mucus Sleeping: recliner  At 30 degrees  SABA use: none 02:  2lpm / 3lpm with exertion   rec Call your dermatologist today for evaluation of your scalp Please see patient coordinator before you leave today  to schedule PET scan and I'll call the results  Resume PT at [facility]  For cough/ congestion > mucinex dm up 1200 mg every 12 hours as needed    10/21/2018 admit with L Hip pain > 2 discectomy    11/13/2018  f/u ov/Erinn Mendosa re:   Copd II/ 02 dep/ MO - no resp rx no better on anoro or stiolto Chief Complaint  Patient presents with  . Follow-up    follows  for COPD. breathing doing well. just had back surgery.     Dyspnea:  More slowed by back /L hip pain  Cough: none Sleeping: on back flat bed  One pillow SABA use: none  02: 2lpm 24/7  rec No change recommendations except I strongly recommend bed blocks x  6-8 inches   02/02/2019  f/u ov/Coulson Wehner re:  COPD II /02 dep/ MO -   Chief Complaint  Patient presents with  . Follow-up    Breathing is unchanged. No new co's.    Dyspnea:  MMRC3 = can't walk 100 yards even at a slow pace at a flat grade s stopping due to sob  Even on 02 with adequate sats Cough: none Sleeping: in recliner 10 degrees  SABA use: none / still does not understand how/ when to use saba  02: 2lpm 24/7   No obvious day to day or daytime variability or assoc excess/ purulent sputum or mucus plugs or hemoptysis or cp or chest tightness, subjective wheeze or overt sinus or hb symptoms.   Sleeping as above  without nocturnal  or early am exacerbation  of respiratory  c/o's or need for noct saba. Also denies any obvious fluctuation of symptoms with weather or environmental changes or other aggravating or alleviating factors except as outlined above   No unusual exposure hx or h/o childhood pna/ asthma or knowledge of premature birth.  Current Allergies, Complete Past Medical History, Past Surgical History, Family History, and Social History were reviewed in [employer] record.  ROS  The following are not active complaints unless bolded Hoarseness, sore throat, dysphagia, dental problems, itching, sneezing,  nasal congestion or discharge of excess mucus or purulent secretions, ear ache,   fever, chills, sweats, unintended wt loss or wt gain, classically pleuritic or exertional cp,  orthopnea pnd or arm/hand swelling  or leg swelling, presyncope, palpitations, abdominal pain, anorexia, nausea, vomiting, diarrhea  or change in bowel habits or change in bladder habits, change in stools or change in urine, dysuria, hematuria,  rash, arthralgias, visual complaints,  headache, numbness, weakness or ataxia or problems with walking or coordination,  change in mood or  memory.        Current Meds  Medication Sig  . acetaminophen (TYLENOL) 325 MG tablet Take 2 tablets (650 mg total) by mouth every 6 (six) hours.  Marland Kitchen albuterol (PROVENTIL HFA;VENTOLIN HFA) 108 (90 Base) MCG/ACT inhaler Inhale 2 puffs into the lungs every 6 (six) hours as needed for wheezing or shortness of breath.  Marland Kitchen amLODipine (NORVASC) 10 MG tablet Take 1 tablet (10 mg total) by mouth at bedtime.  . Calcium Carbonate-Simethicone (ALKA-SELTZER HEARTBURN + GAS) 750-80 MG CHEW Chew 1 tablet by mouth as needed (gas).   . Cholecalciferol (VITAMIN D PO) Take 1 capsule by mouth daily.   . furosemide (LASIX) 20 MG tablet Take 1 tablet (20 mg total) by mouth daily as needed.  Marland Kitchen glipiZIDE (GLUCOTROL) 5 MG tablet Take 1 tablet (5 mg total) by mouth 2 (two) times daily before a meal. Keep follow up appointment in April for now  . ketoconazole (NIZORAL) 2 % shampoo Apply 1 application topically as needed for irritation.  . metoprolol succinate (TOPROL-XL) 25 MG 24 hr tablet Take 1 tablet (25 mg total) by mouth daily.  . nitroGLYCERIN (NITROSTAT) 0.4 MG SL tablet Place 1 tablet (0.4 mg total) under the tongue every 5 (five) minutes x 3 doses as needed for chest pain.  Marland Kitchen oxyCODONE (OXY IR/ROXICODONE) 5  MG immediate release tablet Take 1 tablet (5 mg total) by mouth every 6 (six) hours as needed for breakthrough pain.  . OXYGEN Inhale 2 L into the lungs continuous.   . rosuvastatin (CRESTOR) 10 MG tablet Take 1 tablet (10 mg total) by mouth at bedtime.  . tamsulosin (FLOMAX) 0.4 MG CAPS capsule Take 1 capsule (0.4 mg total) by mouth at bedtime.                Objective:   Physical Exam  06/28/2015        326 > 08/15/2015  323 >  11/19/2017  326 >  01/05/2018 320 > 02/02/2018    324 >  05/06/2018  312 >  08/06/2018  317 > 11/13/2018   297  > 02/02/2019   318     05/17/15 329 lb (149.233 kg)  01/19/15 322 lb  (146.058 kg)  01/18/15 319 lb (144.697 kg)    Vital signs reviewed - Note on arrival 02 sats  96% on 2lpm continuous       HEENT: full dentures/ nl oropharynx. Nl external ear canals without cough reflex -  Mild bilateral non-specific turbinate edema     NECK :  without JVD/Nodes/TM/ nl carotid upstrokes bilaterally   LUNGS: no acc muscle use,  Mild barrel  contour chest wall with bilateral  Distant bs s audible wheeze and  without cough on insp or exp maneuver and mild  Hyperresonant  to  percussion bilaterally     CV:  RRR  no s3 or murmur or increase in P2, and Mild pitting edema L > R LE  ABD:  Quite obese soft and nontender with pos late insp Hoover's  in the supine position. No bruits or organomegaly appreciated, bowel sounds nl  MS:   Nl gait/  ext warm without deformities, calf tenderness, cyanosis or clubbing No obvious joint restrictions   SKIN: warm and dry without lesions    NEURO:  alert, approp, nl sensorium with  no motor or cerebellar deficits apparent.             Assessment & Plan:

## 2019-02-02 NOTE — Patient Instructions (Signed)
Only use your albuterol as a rescue medication to be used if you can't catch your breath by resting or doing a relaxed purse lip breathing pattern.  - The less you use it, the better it will work when you need it. - Ok to use up to 2 puffs  every 4 hours if you must but call for immediate appointment if use goes up over your usual need - Don't leave home without it !!  (think of it like the spare tire for your car)   Ok to increase the 02 with exertion with goal of keeping over 90%    Please schedule a follow up visit in 6  months but call sooner if needed

## 2019-02-05 ENCOUNTER — Encounter: Payer: Self-pay | Admitting: Internal Medicine

## 2019-02-05 DIAGNOSIS — J9611 Chronic respiratory failure with hypoxia: Secondary | ICD-10-CM | POA: Insufficient documentation

## 2019-02-05 DIAGNOSIS — J9612 Chronic respiratory failure with hypercapnia: Secondary | ICD-10-CM | POA: Insufficient documentation

## 2019-02-05 NOTE — Assessment & Plan Note (Signed)
CT chest 01/28/18  New 13 mm nodule at the left lung base  CT Chest 08/05/2018 > The 1.3 cm left lower lobe lung nodule is unchanged from 01/28/2018. This has increased in size when compared with 01/17/2016 and is new when compared with 01/28/2014. Slow growing, indolent neoplasm cannot be excluded. PET  08/17/2018 >>>1.3 cm left lower lobe pulmonary nodule shows no hypermetabolism above background blood pool levels > rec 6 m Ct  Chest for 02/16/19>>   in reminder file   Discussed in detail all the  indications, usual  risks and alternatives  relative to the benefits with patient who agrees to proceed with w/u as outlined.      Each maintenance medication was reviewed in detail including most importantly the difference between maintenance and as needed and under what circumstances the prns are to be used.  Please see AVS for specific  Instructions which are unique to this visit and I personally typed out  which were reviewed in detail in writing with the patient and a copy provided.

## 2019-02-05 NOTE — Assessment & Plan Note (Signed)
Quit smoking 2008  - 2012 PFT's FEV1 of 54% predicted with a ratio of 45%, consistent with moderate to severe COPD with mild inspiratory truncation. - 05/17/2015  Walked RA  2 laps @ 185 ft each stopped due to  Sob/ desat at nl pace to 83%   - trial off acei 05/17/2015  - PFT's  06/28/2015  FEV1 1.67 (51 % ) ratio 55  p 13 % improvement from saba with DLCO  47 % corrects to 66 % for alv volume  - rec trial of symbicort 06/28/15 > coughing / breathing worse so stopped it   - 08/15/2015  Walked RA x 3 laps @ 185 ft each stopped due to End of study, nl pace, min sob/  desat  To 88%  - sats 81% at rest and not acutely ill 11/19/2017 > placed on 2lpm (see separate a/p)  - Spirometry 11/19/2017  FEV1 1.60 (49%)  Ratio 59 with classic curvature - 11/19/2017  After extensive coaching inhaler device  effectiveness =    90% with smi > trial of stiolto   PFT's  01/05/2018  FEV1 1.66 (52 % ) ratio 60  p 6 % improvement from saba p 6 prior to study with DLCO  38 % corrects to 56  % for alv volume   - 01/05/2018  Referred to rehab [facility] - Anoro trial 02/02/2018 > not better so d/c'd   Needs to get back to rehab as soon as  COVID - 19 restrictions have been lifted. In meantime walking outside is fine as long as physically distancing.  Nothing else to offer at this point

## 2019-02-05 NOTE — Assessment & Plan Note (Signed)
PFTs  06/28/2015 with restrictive component with erv 41%  Body mass index is 46.96 kg/m.  -  trending up again  Lab Results  Component Value Date   TSH 3.74 11/19/2017     Contributing to gerd risk/ doe/reviewed the need and the process to achieve and maintain neg calorie balance > defer f/u primary care including intermittently monitoring thyroid status

## 2019-02-05 NOTE — Assessment & Plan Note (Signed)
11/19/2017  sats 81% at rest in a chronic stable state so rec 2lpm 24/7  - HC03  11/04/18 = 33  C/w mild hypercabia in low/mid 50's  Reviewed goals of keeping sats > 90% esp with activity to help improve ex tol and conditioning and "burn fat/ lose wt" by monitoring sats and increasing if needed and calling if not able to do this on max settings   Otherwise default is 2lpm 24/7

## 2019-02-12 ENCOUNTER — Ambulatory Visit: Payer: Medicare HMO | Admitting: Internal Medicine

## 2019-02-15 ENCOUNTER — Encounter: Payer: Self-pay | Admitting: Family Medicine

## 2019-02-15 ENCOUNTER — Ambulatory Visit (INDEPENDENT_AMBULATORY_CARE_PROVIDER_SITE_OTHER): Payer: Medicare HMO | Admitting: Family Medicine

## 2019-02-15 ENCOUNTER — Other Ambulatory Visit: Payer: Self-pay

## 2019-02-15 VITALS — BP 128/72 | HR 63 | Temp 98.1°F | Resp 22 | Ht 69.0 in | Wt 313.0 lb

## 2019-02-15 DIAGNOSIS — Z9889 Other specified postprocedural states: Secondary | ICD-10-CM

## 2019-02-15 DIAGNOSIS — E1122 Type 2 diabetes mellitus with diabetic chronic kidney disease: Secondary | ICD-10-CM

## 2019-02-15 DIAGNOSIS — I10 Essential (primary) hypertension: Secondary | ICD-10-CM

## 2019-02-15 DIAGNOSIS — J9612 Chronic respiratory failure with hypercapnia: Secondary | ICD-10-CM | POA: Diagnosis not present

## 2019-02-15 DIAGNOSIS — E7800 Pure hypercholesterolemia, unspecified: Secondary | ICD-10-CM

## 2019-02-15 DIAGNOSIS — N182 Chronic kidney disease, stage 2 (mild): Secondary | ICD-10-CM

## 2019-02-15 DIAGNOSIS — J9611 Chronic respiratory failure with hypoxia: Secondary | ICD-10-CM | POA: Diagnosis not present

## 2019-02-15 DIAGNOSIS — S3992XA Unspecified injury of lower back, initial encounter: Secondary | ICD-10-CM | POA: Diagnosis not present

## 2019-02-15 DIAGNOSIS — J449 Chronic obstructive pulmonary disease, unspecified: Secondary | ICD-10-CM

## 2019-02-15 LAB — POCT GLYCOSYLATED HEMOGLOBIN (HGB A1C): Hemoglobin A1C: 6.2 % — AB (ref 4.0–5.6)

## 2019-02-15 MED ORDER — IRBESARTAN 300 MG PO TABS: 300.0000 mg | ORAL_TABLET | Freq: Every day | ORAL | 1 refills | Status: DC

## 2019-02-15 NOTE — Assessment & Plan Note (Addendum)
Hgb A1c looks great on glipizide. Will continue medication. If continuing to be low, may consider lowing dose. Normal foot exam today

## 2019-02-15 NOTE — Assessment & Plan Note (Signed)
Stable doing well. 

## 2019-02-15 NOTE — Progress Notes (Signed)
Subjective:     Alfred Carney is a 68 y.o. male presenting for Diabetes (follow up)     HPI   #Fall on back - last week - feels like he broke his tailbone - was recovering from the back surgery - the pain from the surgery is still present - now more on the tailbone - no new weakness or tingling - still from the prior surgery  #s/p discectomy - recovering well - has seen the neurosurgeon - rare use of the oxycodone  #HTN - no cp - taking amlodipine - has noticed some swelling - ARB stopped in the hospital due to hyperK   #COPD - on 2L of oxygen - no need for albuterol for a while  #Diabetes - taking glipizide - doing well - no sores on feet - checks glucose a few times a week and overall home monitoring with <160 and generally around 100-120  Review of Systems  Respiratory: Negative for cough and shortness of breath (other than baseline).   Cardiovascular: Positive for leg swelling (b/l leg and hand swelling). Negative for chest pain and palpitations.  Endocrine: Negative for polydipsia and polyuria.     Social History   Tobacco Use  Smoking Status Former Smoker  . Packs/day: 1.00  . Years: 35.00  . Pack years: 35.00  . Types: Cigarettes  . Last attempt to quit: 09/16/2006  . Years since quitting: 12.4  Smokeless Tobacco Never Used        Objective:    BP Readings from Last 3 Encounters:  02/15/19 128/72  02/02/19 138/66  01/04/19 128/76   Wt Readings from Last 3 Encounters:  02/15/19 (!) 313 lb (142 kg)  02/02/19 (!) 318 lb (144.2 kg)  01/04/19 (!) 305 lb (138.3 kg)    BP 128/72   Pulse 63   Temp 98.1 F (36.7 C)   Resp (!) 22   Ht 5\' 9"  (1.753 m)   Wt (!) 313 lb (142 kg)   SpO2 97% Comment: on 2 liters of O2  BMI 46.22 kg/m    Physical Exam Constitutional:      Appearance: He is obese. He is not ill-appearing.  HENT:     Right Ear: External ear normal.     Left Ear: External ear normal.  Eyes:     Conjunctiva/sclera:  Conjunctivae normal.  Neck:     Musculoskeletal: Neck supple.  Cardiovascular:     Rate and Rhythm: Normal rate and regular rhythm.     Heart sounds: Murmur present.  Pulmonary:     Effort: Pulmonary effort is normal. No respiratory distress.     Breath sounds: Normal breath sounds.     Comments: On 2L [place] Musculoskeletal:     Comments: Ambulates with Cane Back: no deformity, No spinous TTP, no paraspinous TTP 2+ pedal edema b/l. Left hand with 1+ edema  Skin:    General: Skin is warm and dry.  Neurological:     General: No focal deficit present.     Mental Status: He is alert.  Psychiatric:        Mood and Affect: Mood normal.        Behavior: Behavior normal.        Thought Content: Thought content normal.        Judgment: Judgment normal.     Diabetic Foot Exam - Simple   Simple Foot Form Visual Inspection See comments:  Yes Sensation Testing Intact to touch and monofilament testing bilaterally:  Yes  Pulse Check See comments:  Yes Comments Pt with diffuse dry scaly skin, but no deformities or breakdown. Difficulty palpating pulses 2/2 to edema but with normal color and warmth         Assessment & Plan:   Problem List Items Addressed This Visit      Cardiovascular and Mediastinum   Essential hypertension    Swelling with amlodipine. Will attempt to restart ARB which was stopped due to hyperkalemia. Will repeat labs in 2 weeks to reassess.       Relevant Medications   irbesartan (AVAPRO) 300 MG tablet     Respiratory   COPD GOLD II   Chronic respiratory failure with hypoxia (HCC)    Stable, follows with pulmonology.       Chronic respiratory failure with hypoxia and hypercapnia (HCC)     Endocrine   Type 2 diabetes mellitus with stage 2 chronic kidney disease, without long-term current use of insulin (HCC) - Primary    Hgb A1c looks great on glipizide. Will continue medication. If continuing to be low, may consider lowing dose. Normal foot exam today       Relevant Medications   irbesartan (AVAPRO) 300 MG tablet   Other Relevant Orders   POCT glycosylated hemoglobin (Hb A1C) (Completed)     Other   Hyperlipidemia    Will check lipids in 2 weeks for routine f/u. Already on crestor 10 mg      Relevant Medications   irbesartan (AVAPRO) 300 MG tablet   S/P discectomy    Stable doing well      Tailbone injury, initial encounter    Reassuring exam. Continue home care.           Return in about 2 weeks (around 03/01/2019) for labs and bp mychart.  Lesleigh Noe, MD

## 2019-02-15 NOTE — Assessment & Plan Note (Signed)
Stable, follows with pulmonology

## 2019-02-15 NOTE — Assessment & Plan Note (Signed)
Swelling with amlodipine. Will attempt to restart ARB which was stopped due to hyperkalemia. Will repeat labs in 2 weeks to reassess.

## 2019-02-15 NOTE — Assessment & Plan Note (Signed)
Will check lipids in 2 weeks for routine f/u. Already on crestor 10 mg

## 2019-02-15 NOTE — Assessment & Plan Note (Signed)
Reassuring exam. Continue home care.

## 2019-02-15 NOTE — Patient Instructions (Signed)
Stop the amlodipine today Start Irbesartan 1/2 tablet today After 2-3 days if blood pressure high (>140/90) then take the full tablet of the irbesartan   Return in 2 weeks for labs  Send me a MyChart message in 2-3 weeks with what your blood pressure has been doing and what dose you are taking

## 2019-02-21 DIAGNOSIS — J449 Chronic obstructive pulmonary disease, unspecified: Secondary | ICD-10-CM | POA: Diagnosis not present

## 2019-02-21 DIAGNOSIS — R062 Wheezing: Secondary | ICD-10-CM | POA: Diagnosis not present

## 2019-02-23 DIAGNOSIS — R69 Illness, unspecified: Secondary | ICD-10-CM | POA: Diagnosis not present

## 2019-03-01 ENCOUNTER — Other Ambulatory Visit (INDEPENDENT_AMBULATORY_CARE_PROVIDER_SITE_OTHER): Payer: Medicare HMO

## 2019-03-01 DIAGNOSIS — I10 Essential (primary) hypertension: Secondary | ICD-10-CM

## 2019-03-01 DIAGNOSIS — E7800 Pure hypercholesterolemia, unspecified: Secondary | ICD-10-CM

## 2019-03-01 LAB — BASIC METABOLIC PANEL
BUN: 17 mg/dL (ref 6–23)
CO2: 32 mEq/L (ref 19–32)
Calcium: 9.3 mg/dL (ref 8.4–10.5)
Chloride: 103 mEq/L (ref 96–112)
Creatinine, Ser: 1.11 mg/dL (ref 0.40–1.50)
GFR: 65.64 mL/min (ref >60.00)
Glucose, Bld: 126 mg/dL — ABNORMAL HIGH (ref 70–99)
Potassium: 4.1 mEq/L (ref 3.5–5.1)
Sodium: 143 mEq/L (ref 135–145)

## 2019-03-01 LAB — LIPID PANEL
Cholesterol: 103 mg/dL (ref 0–200)
HDL: 36.2 mg/dL — ABNORMAL LOW (ref >39.00)
LDL Cholesterol: 46 mg/dL (ref 0–99)
NonHDL: 66.59
Total CHOL/HDL Ratio: 3
Triglycerides: 104 mg/dL (ref 0.0–149.0)
VLDL: 20.8 mg/dL (ref 0.0–40.0)

## 2019-03-08 ENCOUNTER — Encounter: Payer: Self-pay | Admitting: Family Medicine

## 2019-03-17 ENCOUNTER — Telehealth: Payer: Self-pay | Admitting: *Deleted

## 2019-03-17 NOTE — Telephone Encounter (Signed)
Script Screening patients for COVID-19 and reviewing new operational procedures  Greeting - The reason I am calling is to share with you some new changes to our processes that are designed to help us keep everyone safe. Is now a good time to speak with you? Patient says "no' - ask them when you can call back and let them know it's important to do this prior to their appointment.  Patient says "yes" - Great, Chadric the first thing I need to do is ask you some screening Questions.  1. To the best of your knowledge, have you been in close contact with any one with a confirmed diagnosis of COVID 19? o No - proceed to next question  2. Have you had any one or more of the following: fever, chills, cough, shortness of breath or any flu-like symptoms? o No - proceed to next question  3. Have you been diagnosed with or have a previous diagnosis of COVID 19? o No - proceed to next question  4. I am going to go over a few other symptoms with you. Please let me know if you are experiencing any of the following: . Ear, nose or throat discomfort . A sore throat . Headache . Muscle pain . Diarrhea . Loss of taste or smell o No - proceed to next question  Thank you for answering these questions. Please know we will ask you these questions or similar questions when you arrive for your appointment and again it's how we are keeping everyone safe. Also, to keep you safe, please use the provided hand sanitizer when you enter the building. Everard, we are asking everyone in the building to wear a mask because they help us prevent the spread of germs. Do you have a mask of your own, if not, we are happy to provide one for you. The last thing I want to go over with you is the no visitor guidelines. This means no one can attend the appointment with you unless you need physical assistance. I understand this may be different from your past appointments and I know this may be difficult but please know if  someone is driving you we are happy to call them for you once your appointment is over.  [INSERT SITE SPECIFIC CHECK IN PROCEDURES]  Lekeith I've given you a lot of information, what questions do you have about what I've talked about today or your appointment tomorrow? 

## 2019-03-18 ENCOUNTER — Other Ambulatory Visit: Payer: Self-pay

## 2019-03-18 ENCOUNTER — Ambulatory Visit (INDEPENDENT_AMBULATORY_CARE_PROVIDER_SITE_OTHER)
Admission: RE | Admit: 2019-03-18 | Discharge: 2019-03-18 | Disposition: A | Discharge status: Home / Self Care | Payer: Medicare HMO | Source: Ambulatory Visit | Attending: Internal Medicine | Admitting: Internal Medicine

## 2019-03-18 DIAGNOSIS — R911 Solitary pulmonary nodule: Secondary | ICD-10-CM | POA: Diagnosis not present

## 2019-03-23 DIAGNOSIS — J449 Chronic obstructive pulmonary disease, unspecified: Secondary | ICD-10-CM | POA: Diagnosis not present

## 2019-03-23 DIAGNOSIS — R062 Wheezing: Secondary | ICD-10-CM | POA: Diagnosis not present

## 2019-03-26 ENCOUNTER — Ambulatory Visit: Payer: Medicare HMO | Admitting: Internal Medicine

## 2019-04-23 DIAGNOSIS — R062 Wheezing: Secondary | ICD-10-CM | POA: Diagnosis not present

## 2019-04-23 DIAGNOSIS — J449 Chronic obstructive pulmonary disease, unspecified: Secondary | ICD-10-CM | POA: Diagnosis not present

## 2019-04-28 DIAGNOSIS — Z9889 Other specified postprocedural states: Secondary | ICD-10-CM | POA: Diagnosis not present

## 2019-04-28 DIAGNOSIS — M47816 Spondylosis without myelopathy or radiculopathy, lumbar region: Secondary | ICD-10-CM | POA: Diagnosis not present

## 2019-04-28 DIAGNOSIS — M5136 Other intervertebral disc degeneration, lumbar region: Secondary | ICD-10-CM | POA: Diagnosis not present

## 2019-04-28 DIAGNOSIS — M48062 Spinal stenosis, lumbar region with neurogenic claudication: Secondary | ICD-10-CM | POA: Diagnosis not present

## 2019-04-29 ENCOUNTER — Other Ambulatory Visit: Payer: Self-pay | Admitting: Family Medicine

## 2019-04-29 DIAGNOSIS — E1122 Type 2 diabetes mellitus with diabetic chronic kidney disease: Secondary | ICD-10-CM

## 2019-04-29 DIAGNOSIS — N182 Chronic kidney disease, stage 2 (mild): Secondary | ICD-10-CM

## 2019-05-04 DIAGNOSIS — H52222 Regular astigmatism, left eye: Secondary | ICD-10-CM | POA: Diagnosis not present

## 2019-05-04 DIAGNOSIS — Z7984 Long term (current) use of oral hypoglycemic drugs: Secondary | ICD-10-CM | POA: Diagnosis not present

## 2019-05-04 DIAGNOSIS — H25093 Other age-related incipient cataract, bilateral: Secondary | ICD-10-CM | POA: Diagnosis not present

## 2019-05-04 DIAGNOSIS — H524 Presbyopia: Secondary | ICD-10-CM | POA: Diagnosis not present

## 2019-05-04 DIAGNOSIS — H5203 Hypermetropia, bilateral: Secondary | ICD-10-CM | POA: Diagnosis not present

## 2019-05-04 DIAGNOSIS — E119 Type 2 diabetes mellitus without complications: Secondary | ICD-10-CM | POA: Diagnosis not present

## 2019-05-04 DIAGNOSIS — I10 Essential (primary) hypertension: Secondary | ICD-10-CM | POA: Diagnosis not present

## 2019-05-04 DIAGNOSIS — H11153 Pinguecula, bilateral: Secondary | ICD-10-CM | POA: Diagnosis not present

## 2019-05-04 LAB — HM DIABETES EYE EXAM

## 2019-05-11 ENCOUNTER — Encounter: Payer: Self-pay | Admitting: Primary Care

## 2019-05-12 DIAGNOSIS — M48061 Spinal stenosis, lumbar region without neurogenic claudication: Secondary | ICD-10-CM | POA: Diagnosis not present

## 2019-05-12 DIAGNOSIS — M5127 Other intervertebral disc displacement, lumbosacral region: Secondary | ICD-10-CM | POA: Diagnosis not present

## 2019-05-12 DIAGNOSIS — M48062 Spinal stenosis, lumbar region with neurogenic claudication: Secondary | ICD-10-CM | POA: Diagnosis not present

## 2019-05-13 ENCOUNTER — Other Ambulatory Visit: Payer: Self-pay | Admitting: Family Medicine

## 2019-05-13 DIAGNOSIS — L210 Seborrhea capitis: Secondary | ICD-10-CM

## 2019-05-14 NOTE — Telephone Encounter (Signed)
Last filled on 11/04/2018 and LOV with Dr Einar Pheasant on 02/15/2019. Please review. Thank you

## 2019-05-14 NOTE — Telephone Encounter (Signed)
Noted, refill sent to pharmacy. 

## 2019-05-19 DIAGNOSIS — M47816 Spondylosis without myelopathy or radiculopathy, lumbar region: Secondary | ICD-10-CM | POA: Diagnosis not present

## 2019-05-19 DIAGNOSIS — M5136 Other intervertebral disc degeneration, lumbar region: Secondary | ICD-10-CM | POA: Diagnosis not present

## 2019-05-19 DIAGNOSIS — M48062 Spinal stenosis, lumbar region with neurogenic claudication: Secondary | ICD-10-CM | POA: Diagnosis not present

## 2019-05-19 DIAGNOSIS — Z9889 Other specified postprocedural states: Secondary | ICD-10-CM | POA: Diagnosis not present

## 2019-05-24 DIAGNOSIS — R062 Wheezing: Secondary | ICD-10-CM | POA: Diagnosis not present

## 2019-05-24 DIAGNOSIS — J449 Chronic obstructive pulmonary disease, unspecified: Secondary | ICD-10-CM | POA: Diagnosis not present

## 2019-06-17 ENCOUNTER — Other Ambulatory Visit: Payer: Self-pay | Admitting: Family Medicine

## 2019-06-17 NOTE — Telephone Encounter (Signed)
Should not need a refill until 11/6

## 2019-06-17 NOTE — Telephone Encounter (Signed)
Last filled on 12/21/2018 for #90 with 1 refill. LOV 02/15/2019 with Dr Einar Pheasant. No future appointments on file.

## 2019-06-21 ENCOUNTER — Other Ambulatory Visit: Payer: Self-pay | Admitting: Family Medicine

## 2019-06-21 NOTE — Telephone Encounter (Signed)
Spoke with patient and he takes Furosemide 1 tablet daily and has about 1 week left of medication. Last filled on 12/21/2018 for #90 with 1 refill. LOV 02/15/2019 with Dr Einar Pheasant. No future appointments on file.

## 2019-06-23 DIAGNOSIS — R062 Wheezing: Secondary | ICD-10-CM | POA: Diagnosis not present

## 2019-06-23 DIAGNOSIS — J449 Chronic obstructive pulmonary disease, unspecified: Secondary | ICD-10-CM | POA: Diagnosis not present

## 2019-07-24 DIAGNOSIS — R062 Wheezing: Secondary | ICD-10-CM | POA: Diagnosis not present

## 2019-07-24 DIAGNOSIS — J449 Chronic obstructive pulmonary disease, unspecified: Secondary | ICD-10-CM | POA: Diagnosis not present

## 2019-08-02 ENCOUNTER — Telehealth: Payer: Self-pay | Admitting: *Deleted

## 2019-08-02 NOTE — Telephone Encounter (Signed)
Called and spoke with pt in regards to the info stated by MW that we needed to reschedule his appt for 14 days after last known contact with his son due to son testing positive for covid. Pt stated the last time his son was over at the house was 11/2 and it was 11/7 that he tested positive for covid.  Asked pt when the sore throat began and he said it began 4 days ago and he felt like it was moving to his chest so he took some cold-ease which helped. He said that his head is currently stopped up as he is having problems with his sinuses. Pt denies any temp and I even had him check it while I was on the phone with him and his temp was 96.4.  Pt said that pt's daughter-n-law also tested positive for covid after his son had tested positive.  Dr. Melvyn Novas, please advise on this what you want [name] to do since it has been 14 days since last known contact with his son. Thanks!

## 2019-08-02 NOTE — Telephone Encounter (Signed)
Needs 14 days after last  Known contact before breaks quarantine per guidelines. So if his son was in house or came closer than 6 feet then he should observe restrictions and reschdule ov for then

## 2019-08-02 NOTE — Telephone Encounter (Signed)
14 days out should be fine but be sure to wear a tight mask and keep it on at all times at office use hand sanitizer etc

## 2019-08-02 NOTE — Telephone Encounter (Signed)
Called and spoke with pt letting him know MW said since he was 14 days out after last contact with son prior to him testing positive for covid it is okay for him to keep his appt. Pt verbalized understanding. Nothing further needed.

## 2019-08-02 NOTE — Telephone Encounter (Signed)
Pt says his son tested positive for covid last week. Son came by to do some work on his car. He answered yes to sore throat in which he took cold medication and it's now gone. Pt neg for rest of symptoms. Do you want patient to proceed with in-office visit?

## 2019-08-03 ENCOUNTER — Ambulatory Visit: Payer: Medicare HMO | Admitting: Internal Medicine

## 2019-08-03 ENCOUNTER — Other Ambulatory Visit: Payer: Self-pay

## 2019-08-03 ENCOUNTER — Encounter: Payer: Self-pay | Admitting: Internal Medicine

## 2019-08-03 DIAGNOSIS — J449 Chronic obstructive pulmonary disease, unspecified: Secondary | ICD-10-CM | POA: Diagnosis not present

## 2019-08-03 DIAGNOSIS — J9611 Chronic respiratory failure with hypoxia: Secondary | ICD-10-CM

## 2019-08-03 DIAGNOSIS — J9612 Chronic respiratory failure with hypercapnia: Secondary | ICD-10-CM | POA: Diagnosis not present

## 2019-08-03 NOTE — Patient Instructions (Addendum)
No change in medications.   We will see if qualify for POC.   Please schedule a follow up visit in 6  months but call sooner if needed

## 2019-08-03 NOTE — Progress Notes (Signed)
Subjective:   Patient ID: Alfred Carney, male    DOB: 1950/10/24    MRN: [id number]   Brief patient profile:  46  yowm quit smoking 2008 with baseline weight of 240 when he stopped smoking in March of 2008 p pna with doe only some better then  worse since summer 2015 on so referred 05/17/2015 to pulmonary clinic by Dr Helane Rima for copd eval as had been seen here in 2012 with GOLD II criteria.     History of Present Illness 05/17/2015 1st [facility] office visit/ Alfred Carney  / copd eval on ACEi  Chief Complaint  Patient presents with   Pulmonary Consult    Referre by Dr. Helane Rima. Pt c/o SOB x 10 yrs, gradually worse over the past yr.  He states that he gets SOB with minimal exertion such as taking a shower or walking 50 yrds on flat surface.   indolent onset progressive doe but always ok at rest/  Sleeps in recliner x 10 years Has osa/ could not tol cpap and tends to fall asleep p eats Assoc with sense of nasal and throat congestion but very little cough - does have hb but controls with prn ppi Had been on several different inhalers including according to the records Spiriva but didn't think any of them really helped and stopped them years ago rec Stop lisinopril  Valsartan 80 mg one daily in place of lisinopril Prilosec Take 30- 60 min before your first and last meals of the day just until you return  GERD  Diet     06/28/2015  f/u ov/Alfred Carney re: GOLD II copd/ obesity Chief Complaint  Patient presents with   Follow-up    PFT done today. Pt states his breathing is unchanged. He c/o cough for the past month- prod with min clear sputum.    Was some better until caught cold x 2 weeks p serving jury duty Has not tried sleeping s recliner  rec Stop hydrodiuril (thiazides contribute to gout)  Start lasix 20 mg daily as needed for swelling  - if you cut the sal out you may not need this  Symbicort 160 Take 2 puffs first thing in am and then another 2 puffs about 12 hours later.        11/19/2017  Extended f/u ov/Alfred Carney re-establish for  worse sob x 3 y  Chief Complaint  Patient presents with   Follow-up    SOB that has not gotten any better since last OV .  Has a CPAP  but unable to tolerate .   indolent gradually worsening Dyspnea: 50 ft to car down 6 steps from the deck and struggles to back x 3-4 months Cough: none Sleep: poor/ restlessness/ recliner but barely elevated / has cpap but can't tol at all  SABA use:  Not using one now  Not using prilosec regularly  rec stiolto 2 pffs each am  Wear 02 2lpm 24/7 for now Please remember to go to the lab and x-ray department downstairs in the basement  for your tests - we will call you with the results when they are available.   Please schedule a follow up office visit in 6 weeks, call sooner if needed with all medications /inhalers/ solutions in hand so we can verify exactly what you are taking. This includes all medications from all doctors and over the counters  - full pfts on return    01/05/2018  f/u ov/Alfred Carney re:   COPD II 02 2  lpm - no better with stiolto so did not fill  / did not bring meds  Chief Complaint  Patient presents with   Follow-up    PFT's done today.  Breathing is unchanged since the last visit.    Dyspnea:  MMRC3 = can't walk 100 yards even at a slow pace at a flat grade s stopping due to sob   Cough: none Sleep: recliner < 30 degrees due to back and breathing SABA use:  Not helping  Overt hb on ppi bid ac > for EGD by Dr Nyoka Cowden rec Only use your Albuterol as rescue  Please see patient coordinator before you leave today  to schedule pulmonary rehab at [place]> done        02/02/2018  f/u ov/Alfred Carney re:  Copd II/ 02 2lpm  Chief Complaint  Patient presents with   Follow-up    Breathing is about the same. He has good and bad days. He is using his ventolin inhaler 1-2 x per wk on average.    Dyspnea:  MMRC3 = can't walk 100 yards even at a slow pace at a flat grade s stopping due to sob    4 min x  .28 miles /level  Stopped half way due to legs  Cough: none Sleep: recliner due to back > breathing rec Omeprazole 40 mg Take 30-60 min before first meal of the day  Add anoro one click each am - fill it if you feel it improves your activity tolerance especially at rehab       05/06/2018  f/u ov/Alfred Carney re:  Copd II/  2lpm ex up to 4lpm was doing fine with rehab until fell/ now in w/c/ no better on anoro so stopped it  Chief Complaint  Patient presents with   Follow-up    c/o stable sob with exertion.    Dyspnea:  15 min on treadmill @ 4lpm @ 0.8 up to 1.0 flat then fell 04/27/18  Cough: no Sleeping: recliner 30 degrees/  SABA use: not really using much, no better on anoro so stopped 02: 2lpm   rec The goal is to  Keep the saturations above 90% when you exercise  Have your pcp refer you to orthopedics if needed  Keep the legs elevated at night as much as you can and consider wearing elastic knee highs during the day     08/06/2018  f/u ov/Alfred Carney re:  COPD II/ 02 dep with MO / spn LLL  Chief Complaint  Patient presents with   Follow-up    headache and head cold for 5 days and "knots in scalp" (saw his PCP).  breathing seems to be better.  O2 at 2L  Dyspnea:  20 min @ l mph flat grade on 3lpm at [employer]  Cough: assoc with nasal congestion/ clear mucus Sleeping: recliner  At 30 degrees  SABA use: none 02:  2lpm / 3lpm with exertion   rec Call your dermatologist today for evaluation of your scalp Please see patient coordinator before you leave today  to schedule PET scan and I'll call the results  Resume [facility] at [facility]  For cough/ congestion > mucinex dm up 1200 mg every 12 hours as needed    10/21/2018 admit with L Hip pain > 2 discectomy    11/13/2018  f/u ov/Alfred Carney re:   Copd II/ 02 dep/ MO - no resp rx no better on anoro or stiolto Chief Complaint  Patient presents with   Follow-up  follows for COPD. breathing doing well. just had back surgery.     Dyspnea:  More slowed by back /L hip pain  Cough: none Sleeping: on back flat bed  One pillow SABA use: none  02: 2lpm 24/7  rec No change recommendations except I strongly recommend bed blocks x  6-8 inches   02/02/2019  f/u ov/Alfred Carney re:  COPD II /02 dep/ MO -   Chief Complaint  Patient presents with   Follow-up    Breathing is unchanged. No new co's.    Dyspnea:  MMRC3 = can't walk 100 yards even at a slow pace at a flat grade s stopping due to sob  Even on 02 with adequate sats Cough: none Sleeping: in recliner 10 degrees  SABA use: none / still does not understand how/ when to use saba  02: 2lpm 24/7 rec Only use your albuterol as a rescue medication  Ok to increase the 02 with exertion with goal of keeping over 90%    08/03/2019  f/u ov/Alfred Carney re: COPD II / 02 dep @ 2lpm  With MO / no maint rx having failed lama/laba trials previously so just uses saba prn  Chief Complaint  Patient presents with   Follow-up    Breathing is overall doing well and he has not been using his rescue inhaler.   Dyspnea:  MMRC3 = can't walk 100 yards even at a slow pace at a flat grade s stopping due to sob  / does not check while walking  Cough: no cough  Sleeping: in recliner but almost flat  SABA use: none  02: 2lpm 24/7    No obvious day to day or daytime variability or assoc excess/ purulent sputum or mucus plugs or hemoptysis or cp or chest tightness, subjective wheeze or overt sinus or hb symptoms.   Sleeping as above without nocturnal  or early am exacerbation  of respiratory  c/o's or need for noct saba. Also denies any obvious fluctuation of symptoms with weather or environmental changes or other aggravating or alleviating factors except as outlined above   No unusual exposure hx or h/o childhood pna/ asthma or knowledge of premature birth.  Current Allergies, Complete Past Medical History, Past Surgical History, Family History, and Social History were reviewed in [facility] record.  ROS  The following are not active complaints unless bolded Hoarseness, sore throat, dysphagia, dental problems, itching, sneezing,  nasal congestion or discharge of excess mucus or purulent secretions, ear ache,   fever, chills, sweats, unintended wt loss or wt gain, classically pleuritic or exertional cp,  orthopnea pnd or arm/hand swelling  or leg swelling, presyncope, palpitations, abdominal pain, anorexia, nausea, vomiting, diarrhea  or change in bowel habits or change in bladder habits, change in stools or change in urine, dysuria, hematuria,  rash, arthralgias, visual complaints, headache, numbness, weakness or ataxia or problems with walking or coordination,  change in mood or  memory.        Current Meds  Medication Sig   acetaminophen (TYLENOL) 325 MG tablet Take 2 tablets (650 mg total) by mouth every 6 (six) hours.   albuterol (PROVENTIL HFA;VENTOLIN HFA) 108 (90 Base) MCG/ACT inhaler Inhale 2 puffs into the lungs every 6 (six) hours as needed for wheezing or shortness of breath.   Calcium Carbonate-Simethicone (ALKA-SELTZER HEARTBURN + GAS) 750-80 MG CHEW Chew 1 tablet by mouth as needed (gas).    Cholecalciferol (VITAMIN D PO) Take 1 capsule by mouth daily.  furosemide (LASIX) 20 MG tablet Take 1 tablet (20 mg total) by mouth daily as needed. Needs office visit for additional refills.   glipiZIDE (GLUCOTROL) 5 MG tablet Take 1 tablet (5 mg total) by mouth 2 (two) times daily before a meal.   irbesartan (AVAPRO) 300 MG tablet Take 1 tablet (300 mg total) by mouth daily.   ketoconazole (NIZORAL) 2 % shampoo APPLY 1 APPLICATION TOPICALLY AS NEEDED FOR IRRITATION.   metoprolol succinate (TOPROL-XL) 25 MG 24 hr tablet Take 1 tablet (25 mg total) by mouth daily.   nitroGLYCERIN (NITROSTAT) 0.4 MG SL tablet Place 1 tablet (0.4 mg total) under the tongue every 5 (five) minutes x 3 doses as needed for chest pain.   oxyCODONE (OXY IR/ROXICODONE) 5 MG  immediate release tablet Take 1 tablet (5 mg total) by mouth every 6 (six) hours as needed for breakthrough pain.   OXYGEN Inhale 2 L into the lungs continuous.    rosuvastatin (CRESTOR) 10 MG tablet Take 1 tablet (10 mg total) by mouth at bedtime.   tamsulosin (FLOMAX) 0.4 MG CAPS capsule Take 1 capsule (0.4 mg total) by mouth at bedtime.                   Objective:   Physical Exam  06/28/2015        326 > 08/15/2015  323 >  11/19/2017  326 >  01/05/2018 320 > 02/02/2018    324 >  05/06/2018  312 >  08/06/2018  317 > 11/13/2018   297  > 02/02/2019   318  > 08/03/2019   329     05/17/15 329 lb (149.233 kg)  01/19/15 322 lb (146.058 kg)  01/18/15 319 lb (144.697 kg)     Obese hoarse wm nad   BP 128/76 (BP Location: Left Arm, Cuff Size: Large)    Pulse (!) 56    Temp (!) 97.2 F (36.2 C) (Temporal)    Ht 5\' 9"  (1.753 m)    Wt (!) 329 lb (149.2 kg)    SpO2 95% Comment: 2lpm cont o2   BMI 48.58 kg/m     Reports  full denture     HEENT : pt wearing mask not removed for exam due to covid - 19 concerns.    NECK :  without JVD/Nodes/TM/ nl carotid upstrokes bilaterally   LUNGS: no acc muscle use,  Mild barrel  contour chest wall with bilateral  Distant bs s audible wheeze and  without cough on insp or exp maneuvers  and mild  Hyperresonant  to  percussion bilaterally     CV:  RRR  no s3 or murmur or increase in P2, and 1+ pitting both LEs   ABD: massively obese  soft and nontender with pos end  insp Hoover's  in the supine position. No bruits or organomegaly appreciated, bowel sounds nl  MS:   Nl gait/  ext warm without deformities, calf tenderness, cyanosis or clubbing No obvious joint restrictions   SKIN: warm and dry without lesions    NEURO:  alert, approp, nl sensorium with  no motor or cerebellar deficits apparent.             Assessment & Plan:

## 2019-08-04 ENCOUNTER — Encounter: Payer: Self-pay | Admitting: Internal Medicine

## 2019-08-04 NOTE — Assessment & Plan Note (Signed)
Quit smoking 2008  - 2012 PFT's FEV1 of 54% predicted with a ratio of 45%, consistent with moderate to severe COPD with mild inspiratory truncation. - 05/17/2015  Walked RA  2 laps @ 185 ft each stopped due to  Sob/ desat at nl pace to 83%   - trial off acei 05/17/2015  - PFT's  06/28/2015  FEV1 1.67 (51 % ) ratio 55  p 13 % improvement from saba with DLCO  47 % corrects to 66 % for alv volume  - rec trial of symbicort 06/28/15 > coughing / breathing worse so stopped it   - 08/15/2015  Walked RA x 3 laps @ 185 ft each stopped due to End of study, nl pace, min sob/  desat  To 88%  - sats 81% at rest and not acutely ill 11/19/2017 > placed on 2lpm (see separate a/p)  - Spirometry 11/19/2017  FEV1 1.60 (49%)  Ratio 59 with classic curvature - 11/19/2017  After extensive coaching inhaler device  effectiveness =    90% with smi > trial of stiolto   PFT's  01/05/2018  FEV1 1.66 (52 % ) ratio 60  p 6 % improvement from saba p 6 prior to study with DLCO  38 % corrects to 56  % for alv volume   - 01/05/2018  Referred to rehab [facility] - Anoro trial 02/02/2018 > not better so d/c'd   Just using prn saba and rarely that as I don't really believe he has any reversibility and his main problem is obesity/ deconditioning which have worsened since covid restrictions  Advised to get as much ex as possible at home given restrictions from back pain  Pt informed of the seriousness of COVID 19 infection as a direct risk to their health  and safey and to those of their loved ones and should continue to wear facemask in public and minimize exposure to public locations but especially avoid any area or activity where non-close contacts are not observing distancing or wearing an appropriate face mask.   >>>> f/u in 6 m

## 2019-08-04 NOTE — Assessment & Plan Note (Signed)
11/19/2017  sats 81% at rest in a chronic stable state so rec 2lpm 24/7  - HC03  11/04/18 = 33  - 08/03/2019 POC trial could not maintain sats at > 88% on up to 5lpm POC    rec is 2lpm continuous but titrate to keep > 90% with exertion   Advised: Make sure you check your oxygen saturations at highest level of activity to be sure it stays over 90% and adjust upward to maintain this level if needed but remember to turn it back to previous settings when you stop (to conserve your supply).

## 2019-08-04 NOTE — Assessment & Plan Note (Signed)
PFTs  06/28/2015 with restrictive component with erv 41%  Body mass index is 48.58 kg/m.  -  trending up Lab Results  Component Value Date   TSH 3.74 11/19/2017     Contributing to gerd risk/ doe/reviewed the need and the process to achieve and maintain neg calorie balance > defer f/u primary care including intermittently monitoring thyroid status    Discussed in relation to chronic back pain and need for additional surgery. He would be at high risk of needing at least bipap bridge if not trach as a result of what he describes as "7 h surgery" so I would only consider this as a last resort and only then if he had a rational fully informed contingency plan for longterm vent in the event of complications post op as he apparently does poorly on cpap/bipap in past.    Discussed in detail all the  indications, usual  risks and alternatives  relative to the benefits with patient who agrees to proceed with conservative f/u as outlined     I had an extended discussion with the patient reviewing all relevant studies completed to date and  lasting 15 to 20 minutes of a 25 minute visit  which included directly observing ambulatory 02 saturation study documented in a/p section of  today's  office note.  Each maintenance medication was reviewed in detail including most importantly the difference between maintenance and prns and under what circumstances the prns are to be triggered using an action plan format that is not reflected in the computer generated alphabetically organized AVS.     Please see AVS for specific instructions unique to this visit that I personally wrote and verbalized to the the pt in detail and then reviewed with pt  by my nurse highlighting any changes in therapy recommended at today's visit .

## 2019-08-10 ENCOUNTER — Other Ambulatory Visit: Payer: Self-pay | Admitting: Family Medicine

## 2019-08-10 DIAGNOSIS — I10 Essential (primary) hypertension: Secondary | ICD-10-CM

## 2019-08-11 ENCOUNTER — Telehealth: Payer: Self-pay

## 2019-08-11 ENCOUNTER — Other Ambulatory Visit: Payer: Self-pay | Admitting: Family Medicine

## 2019-08-11 NOTE — Telephone Encounter (Signed)
Pt wanted to know why irbesartan was filled because he did not need a refill now. I explained to pt that [facility] on [address] initiated the refill request and pt said he would talk with pharmacy about taking him off auto refill.

## 2019-08-23 DIAGNOSIS — J449 Chronic obstructive pulmonary disease, unspecified: Secondary | ICD-10-CM | POA: Diagnosis not present

## 2019-08-23 DIAGNOSIS — R062 Wheezing: Secondary | ICD-10-CM | POA: Diagnosis not present

## 2019-08-30 DIAGNOSIS — E1151 Type 2 diabetes mellitus with diabetic peripheral angiopathy without gangrene: Secondary | ICD-10-CM | POA: Diagnosis not present

## 2019-08-30 DIAGNOSIS — I25119 Atherosclerotic heart disease of native coronary artery with unspecified angina pectoris: Secondary | ICD-10-CM | POA: Diagnosis not present

## 2019-08-30 DIAGNOSIS — J9610 Chronic respiratory failure, unspecified whether with hypoxia or hypercapnia: Secondary | ICD-10-CM | POA: Diagnosis not present

## 2019-08-30 DIAGNOSIS — G8929 Other chronic pain: Secondary | ICD-10-CM | POA: Diagnosis not present

## 2019-08-30 DIAGNOSIS — I10 Essential (primary) hypertension: Secondary | ICD-10-CM | POA: Diagnosis not present

## 2019-08-30 DIAGNOSIS — E785 Hyperlipidemia, unspecified: Secondary | ICD-10-CM | POA: Diagnosis not present

## 2019-08-30 DIAGNOSIS — J439 Emphysema, unspecified: Secondary | ICD-10-CM | POA: Diagnosis not present

## 2019-08-30 DIAGNOSIS — E11620 Type 2 diabetes mellitus with diabetic dermatitis: Secondary | ICD-10-CM | POA: Diagnosis not present

## 2019-08-30 DIAGNOSIS — K219 Gastro-esophageal reflux disease without esophagitis: Secondary | ICD-10-CM | POA: Diagnosis not present

## 2019-09-23 DIAGNOSIS — J449 Chronic obstructive pulmonary disease, unspecified: Secondary | ICD-10-CM | POA: Diagnosis not present

## 2019-09-23 DIAGNOSIS — R062 Wheezing: Secondary | ICD-10-CM | POA: Diagnosis not present

## 2019-09-25 ENCOUNTER — Other Ambulatory Visit: Payer: Self-pay | Admitting: Primary Care

## 2019-09-25 DIAGNOSIS — L210 Seborrhea capitis: Secondary | ICD-10-CM

## 2019-09-27 NOTE — Telephone Encounter (Signed)
LOV 02/15/2019. No future appointments scheduled

## 2019-10-20 ENCOUNTER — Telehealth: Payer: Self-pay

## 2019-10-20 ENCOUNTER — Other Ambulatory Visit: Payer: Self-pay | Admitting: Family Medicine

## 2019-10-20 DIAGNOSIS — N400 Enlarged prostate without lower urinary tract symptoms: Secondary | ICD-10-CM

## 2019-10-20 NOTE — Telephone Encounter (Signed)
Spoke with patient and scheduled follow up for 10/21/19

## 2019-10-20 NOTE — Telephone Encounter (Signed)
LOV 02/15/2019. No future appointments.

## 2019-10-20 NOTE — Telephone Encounter (Signed)
Please have patient schedule follow-up appointment within next 1-2 months.

## 2019-10-20 NOTE — Telephone Encounter (Signed)
Patient is scheduled for 10/21/19-see other note

## 2019-10-20 NOTE — Telephone Encounter (Signed)
Patient overdue for routine follow-up visit. Recommend visit to discuss options.

## 2019-10-20 NOTE — Telephone Encounter (Signed)
Pt called triage line stating he stopped the Crestor 1 week ago due to continued pain. He states his pain and swelling has gotten better since stopping it. He cannot take atorvastatin, either. He is willing to take something else if possible. Please advise [phone]

## 2019-10-21 ENCOUNTER — Other Ambulatory Visit: Payer: Self-pay

## 2019-10-21 ENCOUNTER — Ambulatory Visit (INDEPENDENT_AMBULATORY_CARE_PROVIDER_SITE_OTHER): Payer: Medicare HMO | Admitting: Family Medicine

## 2019-10-21 ENCOUNTER — Encounter: Payer: Self-pay | Admitting: Family Medicine

## 2019-10-21 VITALS — BP 130/82 | HR 62 | Temp 97.7°F | Resp 18 | Ht 69.0 in | Wt 326.5 lb

## 2019-10-21 DIAGNOSIS — J9612 Chronic respiratory failure with hypercapnia: Secondary | ICD-10-CM

## 2019-10-21 DIAGNOSIS — J9611 Chronic respiratory failure with hypoxia: Secondary | ICD-10-CM

## 2019-10-21 DIAGNOSIS — I713 Abdominal aortic aneurysm, ruptured, unspecified: Secondary | ICD-10-CM

## 2019-10-21 DIAGNOSIS — J449 Chronic obstructive pulmonary disease, unspecified: Secondary | ICD-10-CM

## 2019-10-21 DIAGNOSIS — E1122 Type 2 diabetes mellitus with diabetic chronic kidney disease: Secondary | ICD-10-CM | POA: Diagnosis not present

## 2019-10-21 DIAGNOSIS — E7800 Pure hypercholesterolemia, unspecified: Secondary | ICD-10-CM

## 2019-10-21 DIAGNOSIS — D72829 Elevated white blood cell count, unspecified: Secondary | ICD-10-CM

## 2019-10-21 DIAGNOSIS — I10 Essential (primary) hypertension: Secondary | ICD-10-CM

## 2019-10-21 DIAGNOSIS — N182 Chronic kidney disease, stage 2 (mild): Secondary | ICD-10-CM

## 2019-10-21 LAB — COMPREHENSIVE METABOLIC PANEL
ALT: 45 U/L (ref 0–53)
AST: 21 U/L (ref 0–37)
Albumin: 4.3 g/dL (ref 3.5–5.2)
Alkaline Phosphatase: 100 U/L (ref 39–117)
BUN: 18 mg/dL (ref 6–23)
CO2: 31 mEq/L (ref 19–32)
Calcium: 9.7 mg/dL (ref 8.4–10.5)
Chloride: 103 mEq/L (ref 96–112)
Creatinine, Ser: 1.13 mg/dL (ref 0.40–1.50)
GFR: 64.18 mL/min (ref >60.00)
Glucose, Bld: 151 mg/dL — ABNORMAL HIGH (ref 70–99)
Potassium: 4.3 mEq/L (ref 3.5–5.1)
Sodium: 143 mEq/L (ref 135–145)
Total Bilirubin: 0.6 mg/dL (ref 0.2–1.2)
Total Protein: 6.9 g/dL (ref 6.0–8.3)

## 2019-10-21 LAB — CBC
HCT: 45.8 % (ref 39.0–52.0)
Hemoglobin: 15.2 g/dL (ref 13.0–17.0)
MCHC: 33.2 g/dL (ref 30.0–36.0)
MCV: 90.6 fl (ref 78.0–100.0)
Platelets: 190 10*3/uL (ref 150.0–400.0)
RBC: 5.06 Mil/uL (ref 4.22–5.81)
RDW: 15.1 % (ref 11.5–15.5)
WBC: 6.3 10*3/uL (ref 4.0–10.5)

## 2019-10-21 LAB — HEMOGLOBIN A1C: Hgb A1c MFr Bld: 6.6 % — ABNORMAL HIGH (ref 4.6–6.5)

## 2019-10-21 MED ORDER — PRAVASTATIN SODIUM 20 MG PO TABS: 20.0000 mg | ORAL_TABLET | Freq: Every day | ORAL | 3 refills | Status: DC

## 2019-10-21 NOTE — Progress Notes (Signed)
Subjective:     Alfred Carney is a 69 y.o. male presenting for Medication Management (discuss choleterol management)     HPI  #HLD - has been taking  - endorses joint pain which is constant - has been having swelling in his hands - feet/hands swelling - has been taking crestor - and son was taking the same medication - he stopped the medicine 1 week ago - pain has significantly improved and swelling has improved - Was on zetia but was told to stop this when crestor was started - not sure if was on other medications for cholesterol in the past - lipitor caused palpitations - thinks he was on something else but this was stopped  #Diabetes - wife checks feet - no lesions - taking glipizide - does not follow diabetic diet, but tries to watch sugar intake  #Chronic respiratory failure - stable  - sees pulmonology  - on 2L - COPD - limited exercise due to dyspnea    Review of Systems   Social History   Tobacco Use  Smoking Status Former Smoker  . Packs/day: 1.00  . Years: 35.00  . Pack years: 35.00  . Types: Cigarettes  . Quit date: 09/16/2006  . Years since quitting: 13.1  Smokeless Tobacco Never Used        Objective:    BP Readings from Last 3 Encounters:  10/21/19 130/82  08/03/19 128/76  02/15/19 128/72   Wt Readings from Last 3 Encounters:  10/21/19 (!) 326 lb 8 oz (148.1 kg)  08/03/19 (!) 329 lb (149.2 kg)  02/15/19 (!) 313 lb (142 kg)    BP 130/82   Pulse 62   Temp 97.7 F (36.5 C)   Resp 18   Ht 5\' 9"  (1.753 m)   Wt (!) 326 lb 8 oz (148.1 kg)   SpO2 96% Comment: on 2 liters of oxygen  BMI 48.22 kg/m    Physical Exam Constitutional:      Appearance: Normal appearance. He is not ill-appearing or diaphoretic.  HENT:     Right Ear: External ear normal.     Left Ear: External ear normal.     Nose:     Comments: mask Eyes:     General: No scleral icterus.    Extraocular Movements: Extraocular movements intact.   Conjunctiva/sclera: Conjunctivae normal.  Cardiovascular:     Rate and Rhythm: Normal rate and regular rhythm.     Pulses: Normal pulses.     Heart sounds: Heart sounds are distant.  Pulmonary:     Effort: Pulmonary effort is normal.     Breath sounds: Decreased breath sounds present. No wheezing, rhonchi or rales.     Comments: On oxygen Musculoskeletal:     Cervical back: Neck supple.  Skin:    General: Skin is warm and dry.  Neurological:     Mental Status: He is alert. Mental status is at baseline.  Psychiatric:        Mood and Affect: Mood normal.    Diabetic Foot Exam - Simple   Simple Foot Form Diabetic Foot exam was performed with the following findings: Yes 10/21/2019 11:38 AM  Visual Inspection See comments: Yes Sensation Testing Intact to touch and monofilament testing bilaterally: Yes Pulse Check Posterior Tibialis and Dorsalis pulse intact bilaterally: Yes Comments 2+ pitting edema b/l. Also with dry, scaly skin.            Assessment & Plan:   Problem List Items Addressed This Visit  Cardiovascular and Mediastinum   Essential hypertension    BP at goal. Cont current medications       Relevant Medications   pravastatin (PRAVACHOL) 20 MG tablet   Other Relevant Orders   Comprehensive metabolic panel   Abdominal aortic aneurysm, ruptured (HCC)   Relevant Medications   pravastatin (PRAVACHOL) 20 MG tablet     Respiratory   COPD GOLD II   Chronic respiratory failure with hypoxia and hypercapnia ([place])    Follows with pulmonology. Stable on chronic 2L O2. Encouraged weight loss and adherence to treatment plan.         Endocrine   Type 2 diabetes mellitus with stage 2 chronic kidney disease, without long-term current use of insulin ([place])    Check labs today. Foot exam reassuring - though with skin changes consider dermatology if not improving. Encouraged diet adherence and weight loss.       Relevant Medications   pravastatin (PRAVACHOL) 20  MG tablet   Other Relevant Orders   Comprehensive metabolic panel   Hemoglobin A1c     Other   Hyperlipidemia - Primary    Per report has tried Atorvastatin and rosuvastatin with side effects. Did well on zetia. Long discussion today about the benefits of statin therapy. Pt will try pravastatin to see if he does not have pain. If pain returns will plan for cardiology referral to discuss benefits of non-statin therapy - zetia/fenofibrate given hx.       Relevant Medications   pravastatin (PRAVACHOL) 20 MG tablet   Morbid obesity due to excess calories (HCC)    Restrictive lung disease. Advised weight loss. Pt has been gaining weight      Leukocytosis    Noted on CBC last year. Will repeat to follow-up      Relevant Orders   CBC       Return in about 3 months (around 01/18/2020).  Lesleigh Noe, MD

## 2019-10-21 NOTE — Patient Instructions (Addendum)
#  Cholesterol  - Try the new medication - let me know your symptoms come back   #diabetes - continue medication  - will check hemoglobin a1c   Start using the lotion on your skin If not improving in 2 weeks -- call me and we will plan for a dermatology referral

## 2019-10-21 NOTE — Assessment & Plan Note (Signed)
Follows with pulmonology. Stable on chronic 2L O2. Encouraged weight loss and adherence to treatment plan.

## 2019-10-21 NOTE — Assessment & Plan Note (Signed)
BP at goal. Cont current medications

## 2019-10-21 NOTE — Assessment & Plan Note (Signed)
Noted on CBC last year. Will repeat to follow-up

## 2019-10-21 NOTE — Assessment & Plan Note (Signed)
Check labs today. Foot exam reassuring - though with skin changes consider dermatology if not improving. Encouraged diet adherence and weight loss.

## 2019-10-21 NOTE — Assessment & Plan Note (Signed)
Per report has tried Atorvastatin and rosuvastatin with side effects. Did well on zetia. Long discussion today about the benefits of statin therapy. Pt will try pravastatin to see if he does not have pain. If pain returns will plan for cardiology referral to discuss benefits of non-statin therapy - zetia/fenofibrate given hx.

## 2019-10-21 NOTE — Assessment & Plan Note (Signed)
Restrictive lung disease. Advised weight loss. Pt has been gaining weight

## 2019-10-24 DIAGNOSIS — J449 Chronic obstructive pulmonary disease, unspecified: Secondary | ICD-10-CM | POA: Diagnosis not present

## 2019-10-24 DIAGNOSIS — R062 Wheezing: Secondary | ICD-10-CM | POA: Diagnosis not present

## 2019-11-15 ENCOUNTER — Other Ambulatory Visit: Payer: Self-pay | Admitting: Family Medicine

## 2019-11-21 DIAGNOSIS — R062 Wheezing: Secondary | ICD-10-CM | POA: Diagnosis not present

## 2019-11-21 DIAGNOSIS — J449 Chronic obstructive pulmonary disease, unspecified: Secondary | ICD-10-CM | POA: Diagnosis not present

## 2019-12-06 ENCOUNTER — Other Ambulatory Visit: Payer: Self-pay | Admitting: Family Medicine

## 2019-12-06 DIAGNOSIS — I10 Essential (primary) hypertension: Secondary | ICD-10-CM

## 2019-12-22 DIAGNOSIS — R062 Wheezing: Secondary | ICD-10-CM | POA: Diagnosis not present

## 2019-12-22 DIAGNOSIS — J449 Chronic obstructive pulmonary disease, unspecified: Secondary | ICD-10-CM | POA: Diagnosis not present

## 2019-12-27 ENCOUNTER — Other Ambulatory Visit: Payer: Self-pay | Admitting: Family Medicine

## 2019-12-27 NOTE — Telephone Encounter (Signed)
Last filled on 10/20/19 330 with 0 refill  LOV 10/21/19 and future appointment on 01/18/20

## 2020-01-18 ENCOUNTER — Other Ambulatory Visit: Payer: Self-pay

## 2020-01-18 ENCOUNTER — Ambulatory Visit (INDEPENDENT_AMBULATORY_CARE_PROVIDER_SITE_OTHER): Payer: Medicare HMO | Admitting: Family Medicine

## 2020-01-18 VITALS — BP 130/62 | HR 64 | Temp 96.9°F | Resp 28 | Ht 69.0 in | Wt 331.5 lb

## 2020-01-18 DIAGNOSIS — E7800 Pure hypercholesterolemia, unspecified: Secondary | ICD-10-CM

## 2020-01-18 DIAGNOSIS — I10 Essential (primary) hypertension: Secondary | ICD-10-CM

## 2020-01-18 DIAGNOSIS — E1122 Type 2 diabetes mellitus with diabetic chronic kidney disease: Secondary | ICD-10-CM | POA: Diagnosis not present

## 2020-01-18 DIAGNOSIS — N182 Chronic kidney disease, stage 2 (mild): Secondary | ICD-10-CM | POA: Diagnosis not present

## 2020-01-18 DIAGNOSIS — J449 Chronic obstructive pulmonary disease, unspecified: Secondary | ICD-10-CM | POA: Diagnosis not present

## 2020-01-18 DIAGNOSIS — N400 Enlarged prostate without lower urinary tract symptoms: Secondary | ICD-10-CM | POA: Diagnosis not present

## 2020-01-18 LAB — POCT GLYCOSYLATED HEMOGLOBIN (HGB A1C): Hemoglobin A1C: 6.2 % — AB (ref 4.0–5.6)

## 2020-01-18 MED ORDER — TAMSULOSIN HCL 0.4 MG PO CAPS: ORAL_CAPSULE | ORAL | 3 refills | Status: DC

## 2020-01-18 NOTE — Assessment & Plan Note (Signed)
Doing well. Continue tamsulosin

## 2020-01-18 NOTE — Assessment & Plan Note (Signed)
BP at goal.  Continue current medications.

## 2020-01-18 NOTE — Assessment & Plan Note (Signed)
Tolerating pravastatin w/o side effects. Continue, repeat labs in 6 months.

## 2020-01-18 NOTE — Assessment & Plan Note (Signed)
Doing well with excellent control on glipizide. Discussed that it is ok to take 1 time per day. Not great at following diabetic diet but has reduced sugars. Could do trial off medication, but will continue current plan. Return in 6 months.

## 2020-01-18 NOTE — Progress Notes (Signed)
Subjective:     Benyamin Nakamura is a 69 y.o. male presenting for Diabetes (3 months follow up)     HPI   #Diabetes Currently taking glipizide (Glucotrol) - will forget to take it once per day Using medications without difficulties: Yes  Hypoglycemic episodes:has not checked Hyperglycemic episodes:has not checked Feet problems:No  Blood Sugars averaging: does not check Last HgbA1c:  Lab Results  Component Value Date   HGBA1C 6.2 (A) 01/18/2020   Has reduced sugar but is eating more sausage/bacon  Diabetes Health Maintenance Due:    Diabetes Health Maintenance Due  Topic Date Due  . OPHTHALMOLOGY EXAM  05/03/2020  . HEMOGLOBIN A1C  07/20/2020  . FOOT EXAM  10/20/2020    #HTN - taking medicine - no cp, HA  #Breathing difficulty - did pulm rehab a while back and is interested  #BPH - tamsulosin working well  Review of Systems   10/21/2019: Clinic - BP at goal, DM - emphasized diet, HLD - trial of pravastatin  Social History   Tobacco Use  Smoking Status Former Smoker  . Packs/day: 1.00  . Years: 35.00  . Pack years: 35.00  . Types: Cigarettes  . Quit date: 09/16/2006  . Years since quitting: 13.3  Smokeless Tobacco Never Used        Objective:    BP Readings from Last 3 Encounters:  01/18/20 130/62  10/21/19 130/82  08/03/19 128/76   Wt Readings from Last 3 Encounters:  01/18/20 (!) 331 lb 8 oz (150.4 kg)  10/21/19 (!) 326 lb 8 oz (148.1 kg)  08/03/19 (!) 329 lb (149.2 kg)    BP 130/62   Pulse 64   Temp (!) 96.9 F (36.1 C)   Resp (!) 28   Ht 5\' 9"  (1.753 m)   Wt (!) 331 lb 8 oz (150.4 kg)   SpO2 96% Comment: on 2 liters of Oxygen  BMI 48.95 kg/m    Physical Exam Constitutional:      Appearance: Normal appearance. He is obese. He is not ill-appearing or diaphoretic.  HENT:     Right Ear: External ear normal.     Left Ear: External ear normal.     Nose: Nose normal.  Eyes:     General: No scleral icterus.    Extraocular Movements:  Extraocular movements intact.     Conjunctiva/sclera: Conjunctivae normal.  Cardiovascular:     Rate and Rhythm: Normal rate and regular rhythm.     Heart sounds: Heart sounds are distant.  Pulmonary:     Effort: Pulmonary effort is normal.     Breath sounds: Normal breath sounds. No decreased breath sounds or wheezing.     Comments: On oxygen Musculoskeletal:     Cervical back: Neck supple.  Skin:    General: Skin is warm and dry.  Neurological:     Mental Status: He is alert. Mental status is at baseline.  Psychiatric:        Mood and Affect: Mood normal.           Assessment & Plan:   Problem List Items Addressed This Visit      Cardiovascular and Mediastinum   Essential hypertension    BP at goal. Continue current medications.         Respiratory   COPD GOLD II    Follows with pulmonology. Still has limitations due to breathing. Discussed returning to pulm rehab and he will consider. Appreciate pulm support  Endocrine   Type 2 diabetes mellitus with stage 2 chronic kidney disease, without long-term current use of insulin ([place]) - Primary    Doing well with excellent control on glipizide. Discussed that it is ok to take 1 time per day. Not great at following diabetic diet but has reduced sugars. Could do trial off medication, but will continue current plan. Return in 6 months.       Relevant Orders   POCT glycosylated hemoglobin (Hb A1C) (Completed)     Genitourinary   Benign prostatic hyperplasia    Doing well. Continue tamsulosin      Relevant Medications   tamsulosin (FLOMAX) 0.4 MG CAPS capsule     Other   Hyperlipidemia    Tolerating pravastatin w/o side effects. Continue, repeat labs in 6 months.           Return in about 6 months (around 07/20/2020).  Lesleigh Noe, MD

## 2020-01-18 NOTE — Assessment & Plan Note (Signed)
Follows with pulmonology. Still has limitations due to breathing. Discussed returning to pulm rehab and he will consider. Appreciate pulm support

## 2020-01-18 NOTE — Patient Instructions (Addendum)
#  Diabetes - Looking better! - continue taking glipizide with large meals  Reschedule wellness visit for 6 months   Call if you need a referral for pulmonary rehab

## 2020-01-19 ENCOUNTER — Ambulatory Visit: Payer: Medicare HMO

## 2020-01-21 DIAGNOSIS — J449 Chronic obstructive pulmonary disease, unspecified: Secondary | ICD-10-CM | POA: Diagnosis not present

## 2020-01-21 DIAGNOSIS — R062 Wheezing: Secondary | ICD-10-CM | POA: Diagnosis not present

## 2020-02-01 ENCOUNTER — Ambulatory Visit: Payer: Medicare HMO | Admitting: Internal Medicine

## 2020-02-03 ENCOUNTER — Other Ambulatory Visit: Payer: Self-pay | Admitting: Family Medicine

## 2020-02-03 DIAGNOSIS — L210 Seborrhea capitis: Secondary | ICD-10-CM

## 2020-02-07 ENCOUNTER — Ambulatory Visit: Payer: Medicare HMO | Admitting: Internal Medicine

## 2020-02-07 ENCOUNTER — Encounter: Payer: Self-pay | Admitting: Internal Medicine

## 2020-02-07 ENCOUNTER — Other Ambulatory Visit: Payer: Self-pay

## 2020-02-07 DIAGNOSIS — J9612 Chronic respiratory failure with hypercapnia: Secondary | ICD-10-CM

## 2020-02-07 DIAGNOSIS — J9611 Chronic respiratory failure with hypoxia: Secondary | ICD-10-CM | POA: Diagnosis not present

## 2020-02-07 DIAGNOSIS — J449 Chronic obstructive pulmonary disease, unspecified: Secondary | ICD-10-CM

## 2020-02-07 NOTE — Progress Notes (Signed)
Subjective:   Patient ID: Alfred Carney, male    DOB: 1951-08-09    MRN: [id number]   Brief patient profile:  24  yowm quit smoking 2008 with baseline weight of 240 when he stopped smoking in March of 2008 p pna with doe only some better then  worse since summer 2015 on so referred 05/17/2015 to pulmonary clinic by Dr Helane Rima for copd eval as had been seen here in 2012 with GOLD II criteria.     History of Present Illness 05/17/2015 1st [place] [facility] office visit/ Alfred Carney  / copd eval on ACEi  Chief Complaint  Patient presents with  . Pulmonary Consult    Referre by Dr. Helane Rima. Pt c/o SOB x 10 yrs, gradually worse over the past yr.  He states that he gets SOB with minimal exertion such as taking a shower or walking 49 yrds on flat surface.   indolent onset progressive doe but always ok at rest/  Sleeps in recliner x 10 years Has osa/ could not tol cpap and tends to fall asleep p eats Assoc with sense of nasal and throat congestion but very little cough - does have hb but controls with prn ppi Had been on several different inhalers including according to the records Spiriva but didn't think any of them really helped and stopped them years ago rec Stop lisinopril  Valsartan 80 mg one daily in place of lisinopril Prilosec Take 30- 60 min before your first and last meals of the day just until you return  GERD  Diet     06/28/2015  f/u ov/Alfred Carney re: GOLD II copd/ obesity Chief Complaint  Patient presents with  . Follow-up    PFT done today. Pt states his breathing is unchanged. He c/o cough for the past month- prod with min clear sputum.    Was some better until caught cold x 2 weeks p serving jury duty Has not tried sleeping s recliner  rec Stop hydrodiuril (thiazides contribute to gout)  Start lasix 20 mg daily as needed for swelling  - if you cut the sal out you may not need this  Symbicort 160 Take 2 puffs first thing in am and then another 2 puffs about 12 hours later.        11/19/2017  Extended f/u ov/Alfred Carney re-establish for  worse sob x 3 y  Chief Complaint  Patient presents with  . Follow-up    SOB that has not gotten any better since last OV .  Has a CPAP  but unable to tolerate .   indolent gradually worsening Dyspnea: 50 ft to car down 6 steps from the deck and struggles to back x 3-4 months Cough: none Sleep: poor/ restlessness/ recliner but barely elevated / has cpap but can't tol at all  SABA use:  Not using one now  Not using prilosec regularly  rec stiolto 2 pffs each am  Wear 02 2lpm 24/7 for now Please remember to go to the lab and x-ray department downstairs in the basement  for your tests - we will call you with the results when they are available.   Please schedule a follow up office visit in 6 weeks, call sooner if needed with all medications /inhalers/ solutions in hand so we can verify exactly what you are taking. This includes all medications from all doctors and over the counters  - full pfts on return    01/05/2018  f/u ov/Alfred Carney re:   COPD II 02 2  lpm - no better with stiolto so did not fill  / did not bring meds  Chief Complaint  Patient presents with  . Follow-up    PFT's done today.  Breathing is unchanged since the last visit.    Dyspnea:  MMRC3 = can't walk 100 yards even at a slow pace at a flat grade s stopping due to sob   Cough: none Sleep: recliner < 30 degrees due to back and breathing SABA use:  Not helping  Overt hb on ppi bid ac > for EGD by Dr Nyoka Cowden rec Only use your Albuterol as rescue  Please see patient coordinator before you leave today  to schedule pulmonary rehab at [facility]> done        02/02/2018  f/u ov/Alfred Carney re:  Copd II/ 02 2lpm  Chief Complaint  Patient presents with  . Follow-up    Breathing is about the same. He has good and bad days. He is using his ventolin inhaler 1-2 x per wk on average.    Dyspnea:  MMRC3 = can't walk 100 yards even at a slow pace at a flat grade s stopping due to sob    4 min x  .28 miles /level  Stopped half way due to legs  Cough: none Sleep: recliner due to back > breathing rec Omeprazole 40 mg Take 30-60 min before first meal of the day  Add anoro one click each am - fill it if you feel it improves your activity tolerance especially at rehab       05/06/2018  f/u ov/Alfred Carney re:  Copd II/  2lpm ex up to 4lpm was doing fine with rehab until fell/ now in w/c/ no better on anoro so stopped it  Chief Complaint  Patient presents with  . Follow-up    c/o stable sob with exertion.    Dyspnea:  15 min on treadmill @ 4lpm @ 0.8 up to 1.0 flat then fell 04/27/18  Cough: no Sleeping: recliner 30 degrees/  SABA use: not really using much, no better on anoro so stopped 02: 2lpm   rec The goal is to  Keep the saturations above 90% when you exercise  Have your pcp refer you to orthopedics if needed  Keep the legs elevated at night as much as you can and consider wearing elastic knee highs during the day     08/06/2018  f/u ov/Ryenne Lynam re:  COPD II/ 02 dep with MO / spn LLL  Chief Complaint  Patient presents with  . Follow-up    headache and head cold for 5 days and "knots in scalp" (saw his PCP).  breathing seems to be better.  O2 at 2L  Dyspnea:  20 min @ l mph flat grade on 3lpm at [employer]  Cough: assoc with nasal congestion/ clear mucus Sleeping: recliner  At 30 degrees  SABA use: none 02:  2lpm / 3lpm with exertion   rec Call your dermatologist today for evaluation of your scalp Please see patient coordinator before you leave today  to schedule PET scan and I'll call the results  Resume PT at [facility]  For cough/ congestion > mucinex dm up 1200 mg every 12 hours as needed    10/21/2018 admit with L Hip pain > 2 discectomy    11/13/2018  f/u ov/Alfred Carney re:   Copd II/ 02 dep/ MO - no resp rx no better on anoro or stiolto Chief Complaint  Patient presents with  . Follow-up  follows for COPD. breathing doing well. just had back surgery.     Dyspnea:  More slowed by back /L hip pain  Cough: none Sleeping: on back flat bed  One pillow SABA use: none  02: 2lpm 24/7  rec No change recommendations except I strongly recommend bed blocks x  6-8 inches   02/02/2019  f/u ov/Alfred Carney re:  COPD II /02 dep/ MO -   Chief Complaint  Patient presents with  . Follow-up    Breathing is unchanged. No new co's.    Dyspnea:  MMRC3 = can't walk 100 yards even at a slow pace at a flat grade s stopping due to sob  Even on 02 with adequate sats Cough: none Sleeping: in recliner 10 degrees  SABA use: none / still does not understand how/ when to use saba  02: 2lpm 24/7 rec Only use your albuterol as a rescue medication  Ok to increase the 02 with exertion with goal of keeping over 90%    08/03/2019  f/u ov/Alfred Carney re: COPD II / 02 dep @ 2lpm  With MO / no maint rx having failed lama/laba trials previously so just uses saba prn  Chief Complaint  Patient presents with  . Follow-up    Breathing is overall doing well and he has not been using his rescue inhaler.   Dyspnea:  MMRC3 = can't walk 100 yards even at a slow pace at a flat grade s stopping due to sob  / does not check while walking  Cough: no cough  Sleeping: in recliner but almost flat  SABA use: none  02: 2lpm 24/7  rec No change rx     02/07/2020  f/u ov/Alfred Carney re: copd II/ 02 dep MO  Chief Complaint  Patient presents with  . Follow-up    pt states sob when doing activities   Dyspnea:  Very much still housebound / no variability  Cough: none  Sleeping: recliner almost flat ok SABA use: none  02: 2lpm 24/7 not titrating    No obvious day to day or daytime variability or assoc excess/ purulent sputum or mucus plugs or hemoptysis or cp or chest tightness, subjective wheeze or overt sinus or hb symptoms.   Sleeping  without nocturnal  or early am exacerbation  of respiratory  c/o's or need for noct saba. Also denies any obvious fluctuation of symptoms with weather or environmental  changes or other aggravating or alleviating factors except as outlined above   No unusual exposure hx or h/o childhood pna/ asthma or knowledge of premature birth.  Current Allergies, Complete Past Medical History, Past Surgical History, Family History, and Social History were reviewed in [employer] record.  ROS  The following are not active complaints unless bolded Hoarseness, sore throat, dysphagia, dental problems, itching, sneezing,  nasal congestion or discharge of excess mucus or purulent secretions, ear ache,   fever, chills, sweats, unintended wt loss or wt gain, classically pleuritic or exertional cp,  orthopnea pnd or arm/hand swelling  or leg swelling, presyncope, palpitations, abdominal pain, anorexia, nausea, vomiting, diarrhea  or change in bowel habits or change in bladder habits, change in stools or change in urine, dysuria, hematuria,  rash, arthralgias, visual complaints, headache, numbness, weakness or ataxia or problems with walking or coordination,  change in mood or  memory.        Current Meds  Medication Sig  . acetaminophen (TYLENOL) 325 MG tablet Take 2 tablets (650 mg total) by mouth every  6 (six) hours.  Marland Kitchen albuterol (PROVENTIL HFA;VENTOLIN HFA) 108 (90 Base) MCG/ACT inhaler Inhale 2 puffs into the lungs every 6 (six) hours as needed for wheezing or shortness of breath.  . Calcium Carbonate-Simethicone (ALKA-SELTZER HEARTBURN + GAS) 750-80 MG CHEW Chew 1 tablet by mouth as needed (gas).   . Cholecalciferol (VITAMIN D PO) Take 1 capsule by mouth daily.   . furosemide (LASIX) 20 MG tablet Take 1 tablet (20 mg total) by mouth daily.  Marland Kitchen glipiZIDE (GLUCOTROL) 5 MG tablet Take 1 tablet (5 mg total) by mouth 2 (two) times daily before a meal.  . irbesartan (AVAPRO) 300 MG tablet Take 1 tablet (300 mg total) by mouth daily.  Marland Kitchen ketoconazole (NIZORAL) 2 % shampoo APPLY ONE APPLICATION TOPICALLY AS NEEDED FOR IRRITATION  . metoprolol succinate (TOPROL-XL)  25 MG 24 hr tablet Take 1 tablet by mouth once daily  . nitroGLYCERIN (NITROSTAT) 0.4 MG SL tablet Place 1 tablet (0.4 mg total) under the tongue every 5 (five) minutes x 3 doses as needed for chest pain.  Marland Kitchen oxyCODONE (OXY IR/ROXICODONE) 5 MG immediate release tablet Take 1 tablet (5 mg total) by mouth every 6 (six) hours as needed for breakthrough pain.  . OXYGEN Inhale 2 L into the lungs continuous.   . pravastatin (PRAVACHOL) 20 MG tablet Take 1 tablet (20 mg total) by mouth daily.  . tamsulosin (FLOMAX) 0.4 MG CAPS capsule Take 1 capsule by mouth once daily at bedtime                     Objective:   Physical Exam  02/07/2020      329   08/03/2019   329  06/28/2015        326 > 08/15/2015  323 >  11/19/2017  326 >  01/05/2018 320 > 02/02/2018    324 >  05/06/2018  312 >  08/06/2018  317 > 11/13/2018   297  > 02/02/2019   318    05/17/15 329 lb (149.233 kg)  01/19/15 322 lb (146.058 kg)  01/18/15 319 lb (144.697 kg)     Vital signs reviewed  02/07/2020  - Note at rest 02 sats  96% on 2lpm pulsed       Reports  full denture    HEENT : pt wearing mask not removed for exam due to covid - 19 concerns.    NECK :  without JVD/Nodes/TM/ nl carotid upstrokes bilaterally   LUNGS: no acc muscle use,  Mild barrel  contour chest wall with bilateral  Distant bs s audible wheeze and  without cough on insp or exp maneuvers  and mild  Hyperresonant  to  percussion bilaterally     CV:  RRR  no s3 or murmur or increase in P2, and trace to 1+ pitting edema both LEs  ABD: massively obese and nontender with pos end  insp Hoover's. No bruits or organomegaly appreciated, bowel sounds nl  MS:   Nl gait/  ext warm without deformities, calf tenderness, cyanosis or clubbing No obvious joint restrictions   SKIN: warm and dry without lesions    NEURO:  alert, approp, nl sensorium with  no motor or cerebellar deficits apparent.            Assessment & Plan:

## 2020-02-07 NOTE — Patient Instructions (Addendum)
Make sure you check your oxygen saturations at highest level of activity to be sure it stays over 90% and adjust upward to maintain this level if needed but remember to turn it back to previous settings when you stop (to conserve your supply).     Please schedule a follow up visit in 6  months but call sooner if needed   

## 2020-02-08 ENCOUNTER — Encounter: Payer: Self-pay | Admitting: Internal Medicine

## 2020-02-08 NOTE — Assessment & Plan Note (Signed)
PFTs  06/28/2015 with restrictive component with erv 41%  Body mass index is 48.67 kg/m.  -  trending no change Lab Results  Component Value Date   TSH 3.74 11/19/2017     Contributing to gerd risk/ doe/reviewed the need and the process to achieve and maintain neg calorie balance > defer f/u primary care including intermittently monitoring thyroid status

## 2020-02-08 NOTE — Assessment & Plan Note (Signed)
11/19/2017  sats 81% at rest in a chronic stable state so rec 2lpm 24/7  - HC03  11/04/18 = 33  - 08/03/2019 POC trial could not maintain sats at > 88% on up to 5lpm POC    Adequate control on present rx, reviewed in detail with pt > no change in rx needed    Advised : Make sure you check your oxygen saturations at highest level of activity to be sure it stays over 90% and adjust upward to maintain this level if needed but remember to turn it back to previous settings when you stop (to conserve your supply).           Each maintenance medication was reviewed in detail including emphasizing most importantly the difference between maintenance and prns and under what circumstances the prns are to be triggered using an action plan format where appropriate.  Total time for H and P, chart review, counseling, teaching device and generating customized AVS unique to this office visit / charting = 20 min

## 2020-02-08 NOTE — Assessment & Plan Note (Signed)
Quit smoking 2008  - 2012 PFT's FEV1 of 54% predicted with a ratio of 45%, consistent with moderate to severe COPD with mild inspiratory truncation. - 05/17/2015  Walked RA  2 laps @ 185 ft each stopped due to  Sob/ desat at nl pace to 83%   - trial off acei 05/17/2015  - PFT's  06/28/2015  FEV1 1.67 (51 % ) ratio 55  p 13 % improvement from saba with DLCO  47 % corrects to 66 % for alv volume  - rec trial of symbicort 06/28/15 > coughing / breathing worse so stopped it   - 08/15/2015  Walked RA x 3 laps @ 185 ft each stopped due to End of study, nl pace, min sob/  desat  To 88%  - sats 81% at rest and not acutely ill 11/19/2017 > placed on 2lpm (see separate a/p)  - Spirometry 11/19/2017  FEV1 1.60 (49%)  Ratio 59 with classic curvature - 11/19/2017  After extensive coaching inhaler device  effectiveness =    90% with smi > trial of stiolto   PFT's  01/05/2018  FEV1 1.66 (52 % ) ratio 60  p 6 % improvement from saba p 6 prior to study with DLCO  38 % corrects to 56  % for alv volume   - 01/05/2018  Referred to rehab [place] [facility] - Anoro trial 02/02/2018 > not better so d/c'd   No better with laba/lama so maint on prn saba   Main concern now is deconditioning > rec reconditioning

## 2020-02-15 ENCOUNTER — Telehealth: Payer: Self-pay | Admitting: Internal Medicine

## 2020-02-15 NOTE — Telephone Encounter (Signed)
Alfred Carney have you seen any paperwork? I called the pt and he said he gave it to a nurse when he was in the office for an OV. Please advise, or we can just write him a letter excusing jury duty?

## 2020-02-16 ENCOUNTER — Encounter: Payer: Self-pay | Admitting: *Deleted

## 2020-02-16 NOTE — Telephone Encounter (Signed)
Will check on this later today I have not been in the [place] office  Will be there this afternoon and will look through MW's papers

## 2020-02-16 NOTE — Telephone Encounter (Signed)
I located the jury duty summons- it's for 03/30/20 Dr Melvyn Novas- are you okay with excusing him from jury duty?

## 2020-02-16 NOTE — Telephone Encounter (Signed)
yes

## 2020-02-16 NOTE — Telephone Encounter (Signed)
Letter printed  Will take to [place] for MW to sign in the am

## 2020-02-17 NOTE — Telephone Encounter (Signed)
Patient is returning phone call. Would like Dentist and summons sent to his mychart. Patient phone number is [phone].

## 2020-02-17 NOTE — Telephone Encounter (Signed)
Spoke with patient. He is aware that MW has signed the letter for him. He stated that when his wife dropped off the summons letter, she did not make a copy of it so he does not have any information on how to follow up with the courthouse. I advised him I would ask Magda Paganini if she has a copy of the letter. He verbalized understanding.   Magda Paganini, do you have a copy of the [employer]? He said he needs the phone to the courthouse as well as his juror number. Thanks!

## 2020-02-17 NOTE — Telephone Encounter (Signed)
Done

## 2020-02-17 NOTE — Telephone Encounter (Signed)
Letter given to Dr Melvyn Novas to sign

## 2020-02-17 NOTE — Telephone Encounter (Signed)
Letter mailed to courthouse along with Jury Summons per protocol  [facility] for the pt

## 2020-02-17 NOTE — Telephone Encounter (Signed)
Yes- I still have a copy, as the mail has not went yet  Spoke with the pt  I have mailed copy of the summons and the excusal letter to him and the court  Nothing further needed

## 2020-02-21 DIAGNOSIS — R062 Wheezing: Secondary | ICD-10-CM | POA: Diagnosis not present

## 2020-02-21 DIAGNOSIS — J449 Chronic obstructive pulmonary disease, unspecified: Secondary | ICD-10-CM | POA: Diagnosis not present

## 2020-02-26 ENCOUNTER — Other Ambulatory Visit: Payer: Self-pay | Admitting: Family Medicine

## 2020-03-10 ENCOUNTER — Other Ambulatory Visit: Payer: Self-pay | Admitting: Internal Medicine

## 2020-03-10 DIAGNOSIS — R911 Solitary pulmonary nodule: Secondary | ICD-10-CM

## 2020-03-17 ENCOUNTER — Ambulatory Visit (INDEPENDENT_AMBULATORY_CARE_PROVIDER_SITE_OTHER)
Admission: RE | Admit: 2020-03-17 | Discharge: 2020-03-17 | Disposition: A | Discharge status: Home / Self Care | Payer: Medicare HMO | Source: Ambulatory Visit | Attending: Internal Medicine | Admitting: Internal Medicine

## 2020-03-17 ENCOUNTER — Other Ambulatory Visit: Payer: Self-pay

## 2020-03-17 DIAGNOSIS — R911 Solitary pulmonary nodule: Secondary | ICD-10-CM | POA: Diagnosis not present

## 2020-03-17 DIAGNOSIS — I2510 Atherosclerotic heart disease of native coronary artery without angina pectoris: Secondary | ICD-10-CM | POA: Diagnosis not present

## 2020-03-17 DIAGNOSIS — I700 Atherosclerosis of aorta: Secondary | ICD-10-CM | POA: Diagnosis not present

## 2020-03-17 DIAGNOSIS — K449 Diaphragmatic hernia without obstruction or gangrene: Secondary | ICD-10-CM | POA: Diagnosis not present

## 2020-03-17 DIAGNOSIS — J432 Centrilobular emphysema: Secondary | ICD-10-CM | POA: Diagnosis not present

## 2020-03-21 NOTE — Progress Notes (Signed)
Spoke with pt and notified of results per Dr. Wert. Pt verbalized understanding and denied any questions. 

## 2020-03-22 DIAGNOSIS — J449 Chronic obstructive pulmonary disease, unspecified: Secondary | ICD-10-CM | POA: Diagnosis not present

## 2020-03-22 DIAGNOSIS — R062 Wheezing: Secondary | ICD-10-CM | POA: Diagnosis not present

## 2020-04-22 DIAGNOSIS — R062 Wheezing: Secondary | ICD-10-CM | POA: Diagnosis not present

## 2020-04-22 DIAGNOSIS — J449 Chronic obstructive pulmonary disease, unspecified: Secondary | ICD-10-CM | POA: Diagnosis not present

## 2020-04-25 ENCOUNTER — Telehealth: Payer: Self-pay | Admitting: Internal Medicine

## 2020-04-25 DIAGNOSIS — E119 Type 2 diabetes mellitus without complications: Secondary | ICD-10-CM | POA: Diagnosis not present

## 2020-04-25 DIAGNOSIS — Z7984 Long term (current) use of oral hypoglycemic drugs: Secondary | ICD-10-CM | POA: Diagnosis not present

## 2020-04-25 DIAGNOSIS — H11153 Pinguecula, bilateral: Secondary | ICD-10-CM | POA: Diagnosis not present

## 2020-04-25 DIAGNOSIS — H25813 Combined forms of age-related cataract, bilateral: Secondary | ICD-10-CM | POA: Diagnosis not present

## 2020-04-25 DIAGNOSIS — J449 Chronic obstructive pulmonary disease, unspecified: Secondary | ICD-10-CM

## 2020-04-25 DIAGNOSIS — H5202 Hypermetropia, left eye: Secondary | ICD-10-CM | POA: Diagnosis not present

## 2020-04-25 DIAGNOSIS — I10 Essential (primary) hypertension: Secondary | ICD-10-CM | POA: Diagnosis not present

## 2020-04-25 DIAGNOSIS — H52222 Regular astigmatism, left eye: Secondary | ICD-10-CM | POA: Diagnosis not present

## 2020-04-25 DIAGNOSIS — H524 Presbyopia: Secondary | ICD-10-CM | POA: Diagnosis not present

## 2020-04-25 LAB — HM DIABETES EYE EXAM

## 2020-04-25 NOTE — Telephone Encounter (Signed)
Needs a order

## 2020-04-25 NOTE — Telephone Encounter (Signed)
Called and spoke with pt. Pt was walked at office with POC 08/03/19 showing that pt needed 2L pulse with exertion.  Order has been placed for pt to receive POC from [employer]. Nothing further needed.

## 2020-04-26 ENCOUNTER — Telehealth: Payer: Self-pay | Admitting: Internal Medicine

## 2020-04-26 NOTE — Telephone Encounter (Signed)
Spoke with Melissa  Pt does not qualify for POC b/c they are only doing POC for new o2 starts and he has been with them since 2019  They have not informed the pt so I called and let him know and he verbalized understanding

## 2020-05-01 ENCOUNTER — Other Ambulatory Visit: Payer: Self-pay | Admitting: Family Medicine

## 2020-05-03 ENCOUNTER — Encounter: Payer: Self-pay | Admitting: Family Medicine

## 2020-05-04 DIAGNOSIS — E1151 Type 2 diabetes mellitus with diabetic peripheral angiopathy without gangrene: Secondary | ICD-10-CM | POA: Diagnosis not present

## 2020-05-04 DIAGNOSIS — J9610 Chronic respiratory failure, unspecified whether with hypoxia or hypercapnia: Secondary | ICD-10-CM | POA: Diagnosis not present

## 2020-05-04 DIAGNOSIS — E11620 Type 2 diabetes mellitus with diabetic dermatitis: Secondary | ICD-10-CM | POA: Diagnosis not present

## 2020-05-04 DIAGNOSIS — J439 Emphysema, unspecified: Secondary | ICD-10-CM | POA: Diagnosis not present

## 2020-05-04 DIAGNOSIS — G8929 Other chronic pain: Secondary | ICD-10-CM | POA: Diagnosis not present

## 2020-05-04 DIAGNOSIS — I25119 Atherosclerotic heart disease of native coronary artery with unspecified angina pectoris: Secondary | ICD-10-CM | POA: Diagnosis not present

## 2020-05-04 DIAGNOSIS — G4733 Obstructive sleep apnea (adult) (pediatric): Secondary | ICD-10-CM | POA: Diagnosis not present

## 2020-05-04 DIAGNOSIS — E785 Hyperlipidemia, unspecified: Secondary | ICD-10-CM | POA: Diagnosis not present

## 2020-05-04 DIAGNOSIS — Z6841 Body Mass Index (BMI) 40.0 and over, adult: Secondary | ICD-10-CM | POA: Diagnosis not present

## 2020-05-08 IMAGING — CR DG LUMBAR SPINE COMPLETE 4+V
5 series · 5 of 5 positions shown · non-contrast
Comparison: None.

CLINICAL DATA: Lumbago

EXAM:
LUMBAR SPINE - COMPLETE 4+ VIEW

[l-spine ap]
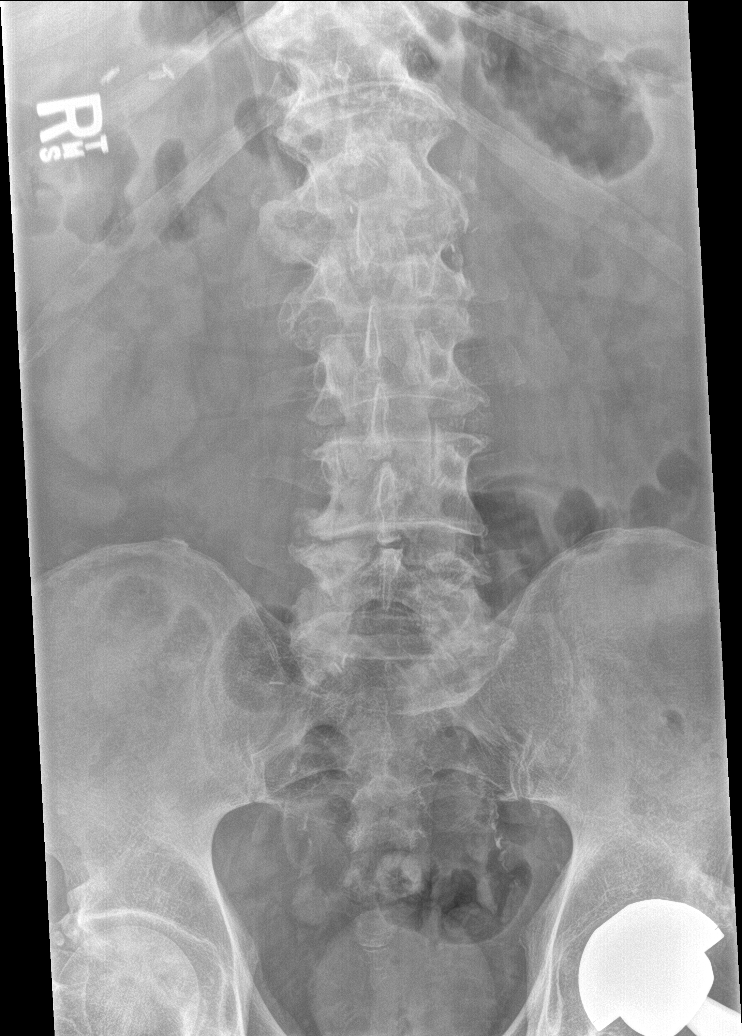

[l-spine obl (1 of 2)]
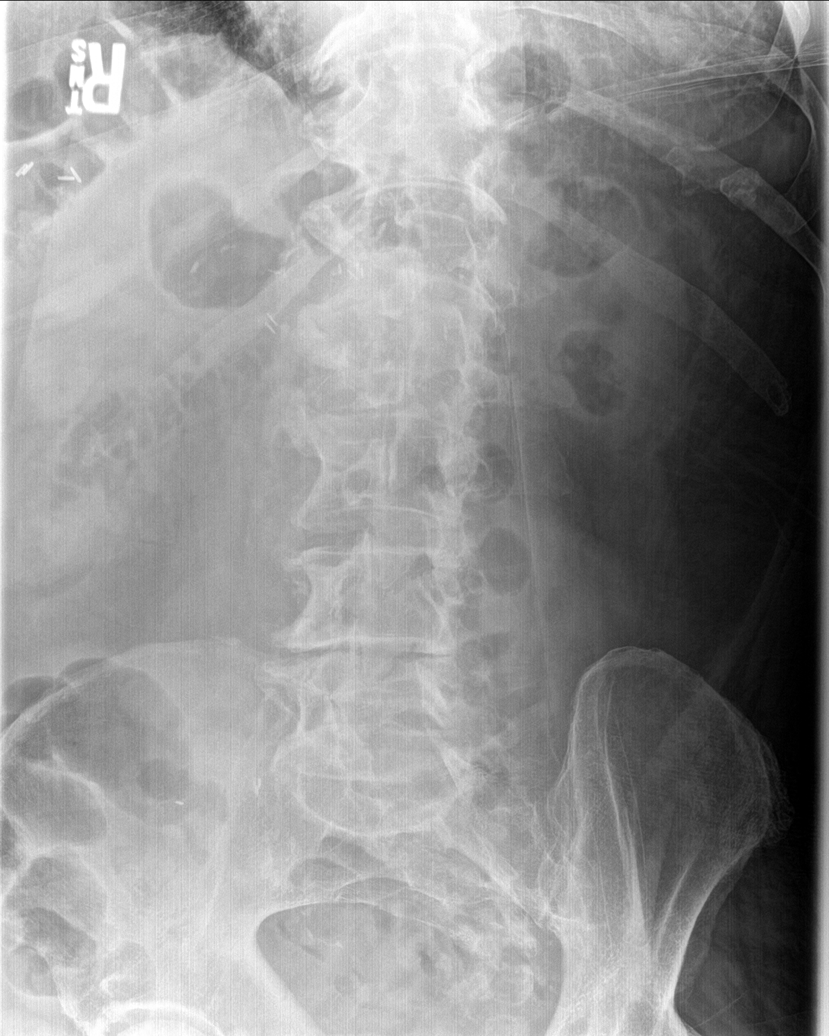

[l-spine obl (2 of 2)]
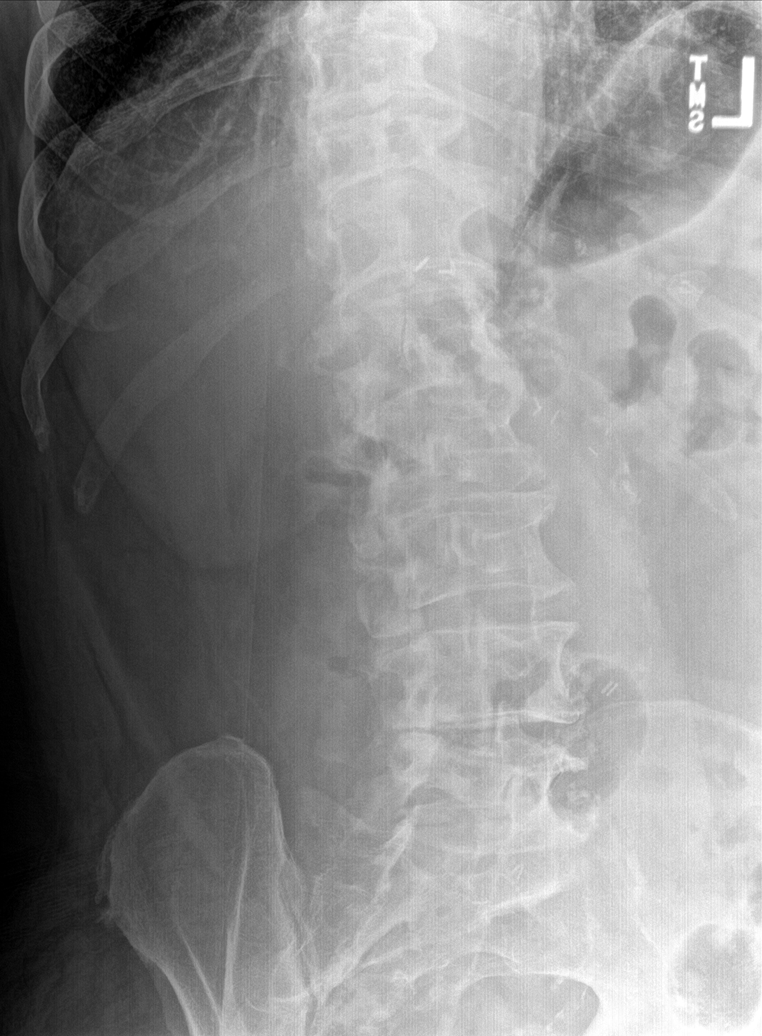

[l-spine lat]
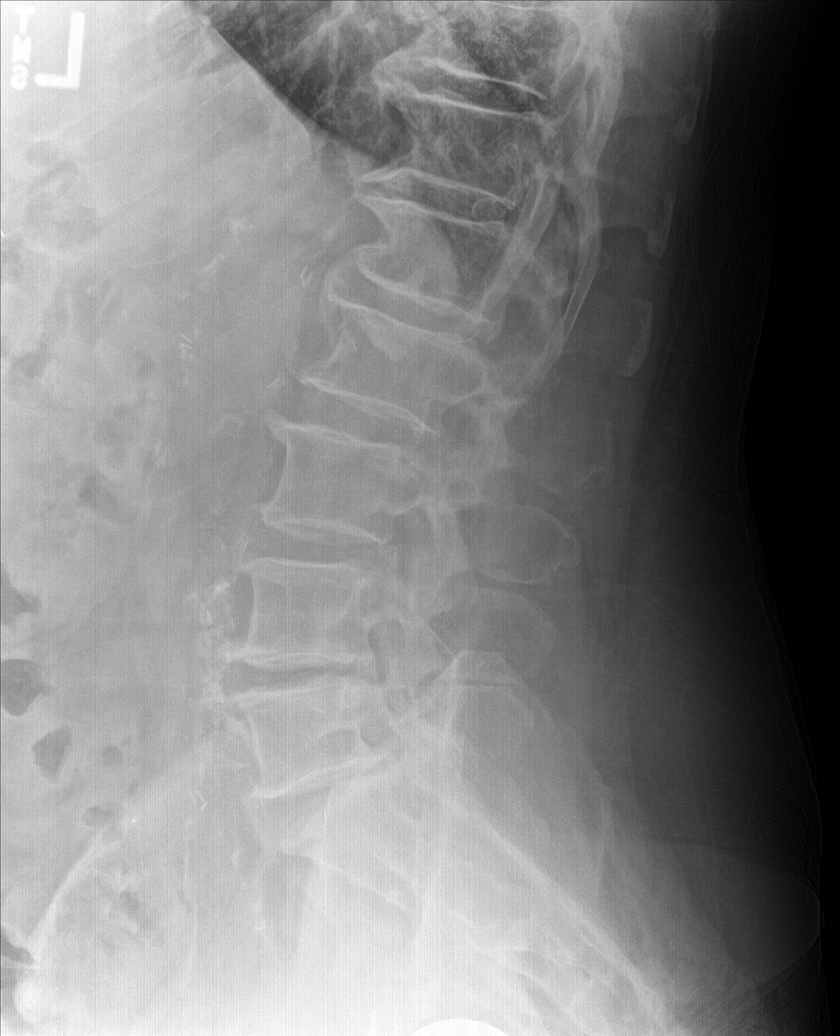

[l-spine spot]
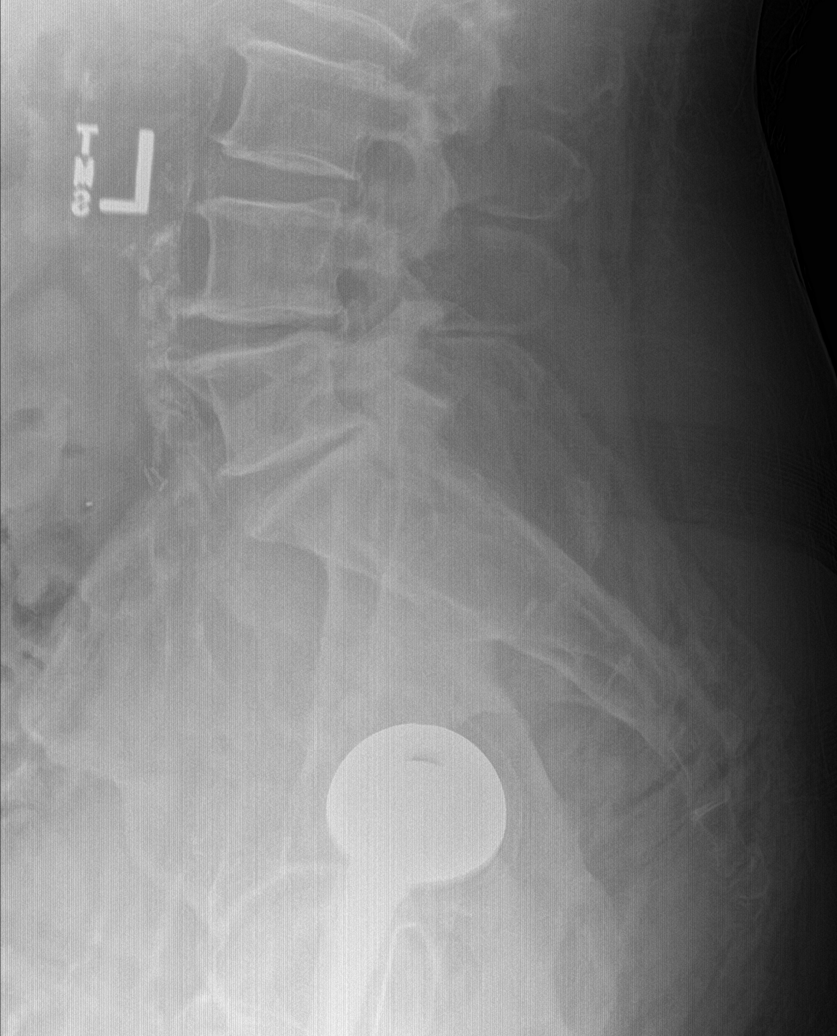

[5 of 5 positions shown; findings below may reference images not displayed]

FINDINGS: Frontal, lateral, spot lumbosacral lateral, and bilateral oblique
views were obtained. There are 5 non-rib-bearing lumbar type
vertebral bodies. There is no fracture or spondylolisthesis. There
is moderate disc space narrowing at L4-5 and L5-S1. There are
anterior osteophytes at all levels. Posterior osteophytes are noted
at L4 and L5. No erosive changes are evident. There is facet
osteoarthritic change all levels bilaterally, most notably at L5-S1
bilaterally.

There is aortoiliac atherosclerosis. There is a total hip
replacement on the left.
IMPRESSION: Multifocal arthropathy, most severe at L4-5 and L5-S1. No fracture
or spondylolisthesis. There is aortoiliac atherosclerosis.

## 2020-05-08 IMAGING — CR DG HIP (WITH OR WITHOUT PELVIS) 2-3V*L*
3 series · 3 of 3 positions shown · non-contrast
Comparison: CT abdomen and pelvis with bony reformats January 28, 2018

CLINICAL DATA: Pain

EXAM:
DG HIP (WITH OR WITHOUT PELVIS) 2-3V LEFT

[pelvis ap]
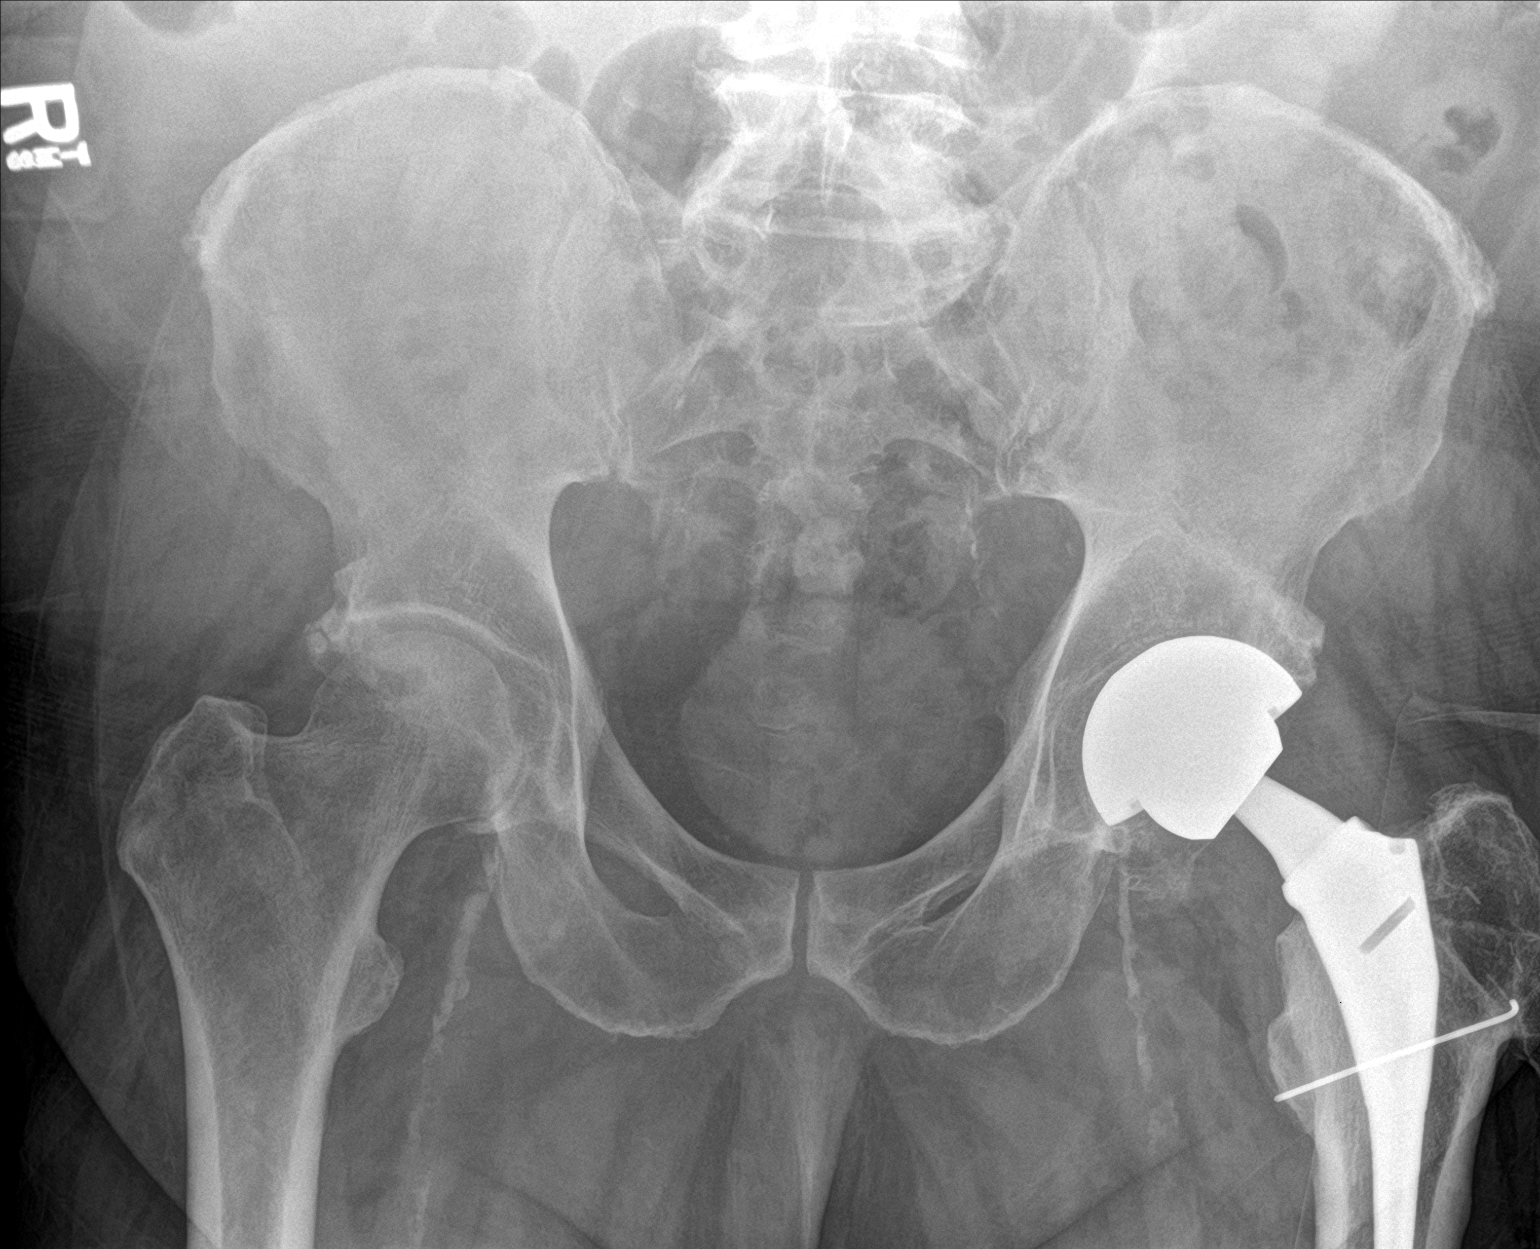

[hip ap]
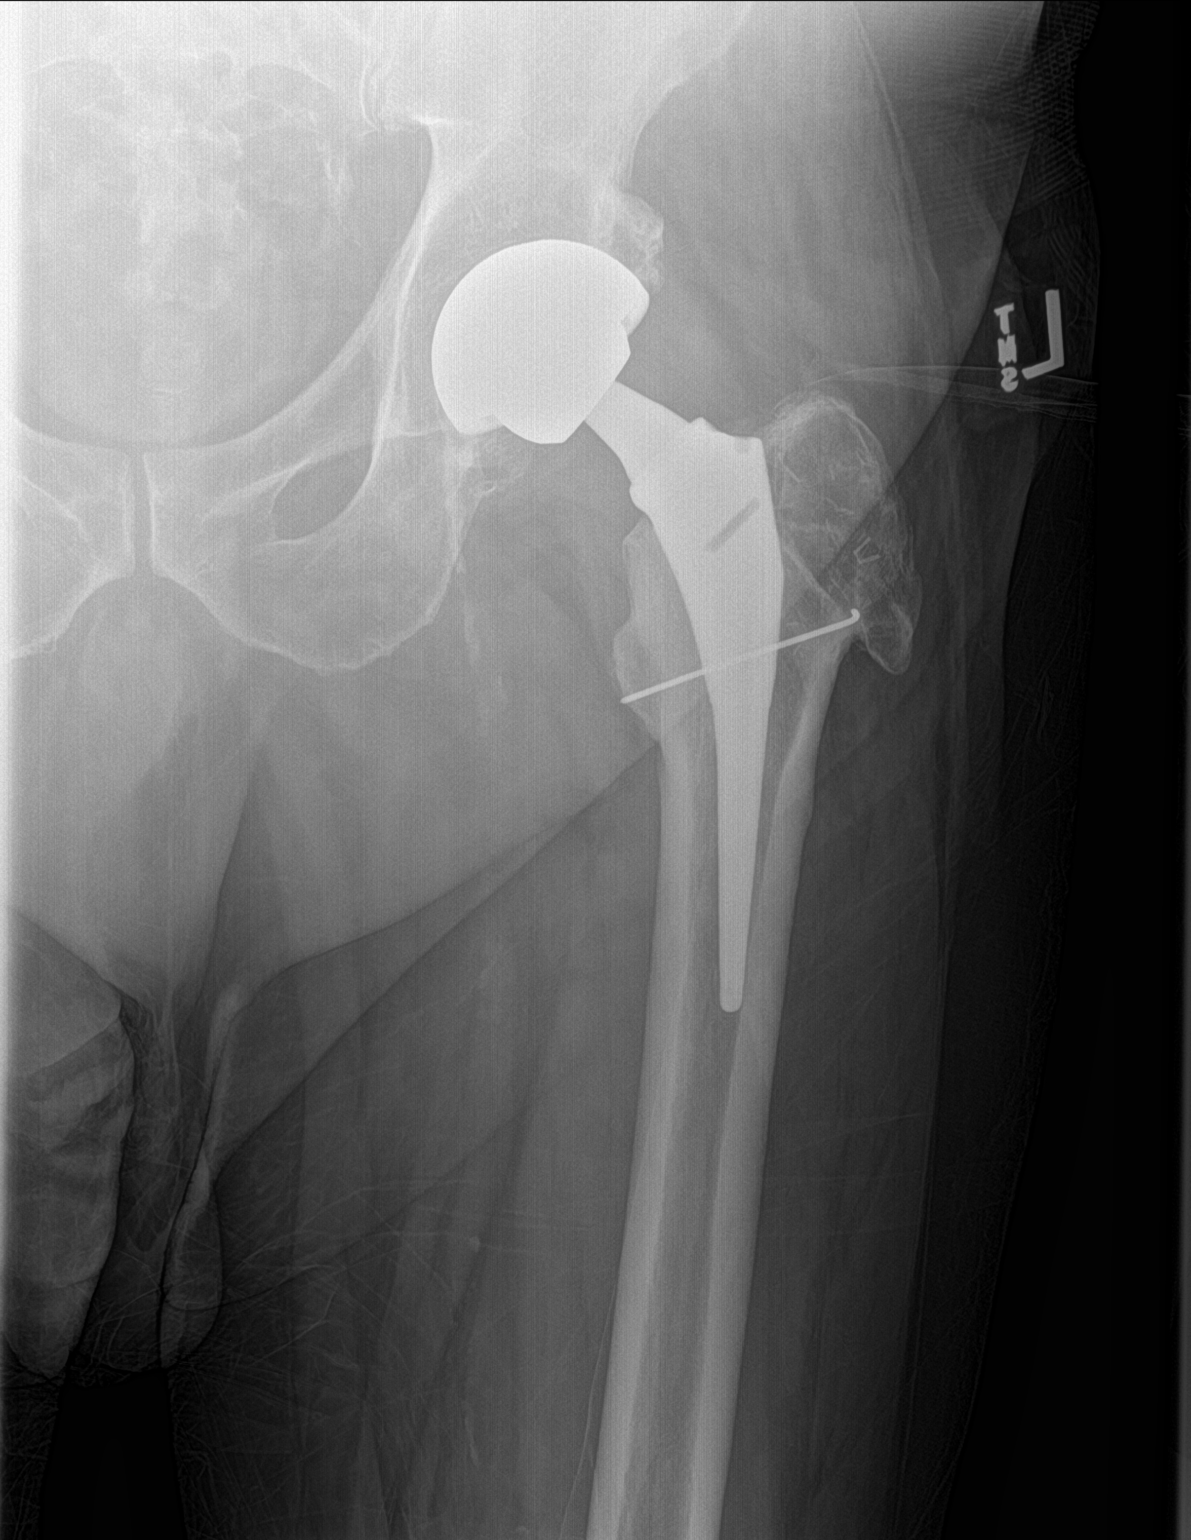

[hip lat]
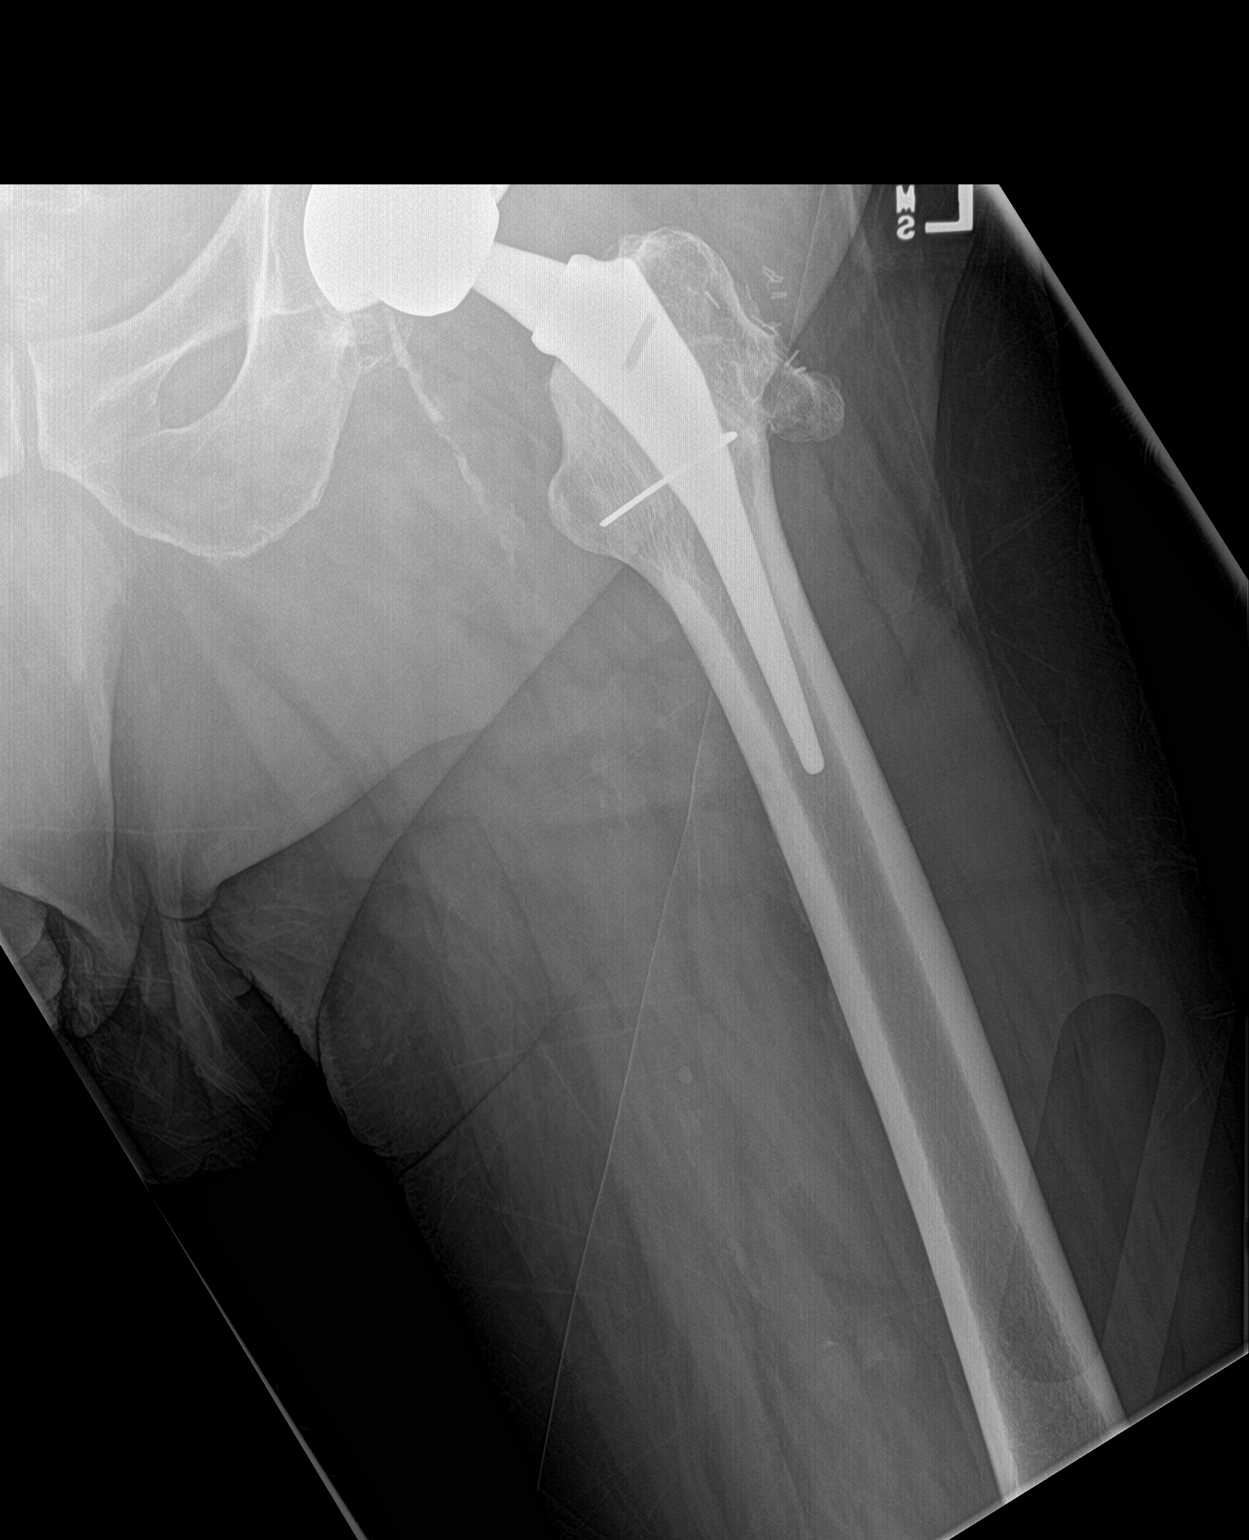

[3 of 3 positions shown; findings below may reference images not displayed]

FINDINGS: Frontal pelvis as well as frontal and lateral left hip images were
obtained. Patient is status post total hip replacement on the left
with prosthetic components well-seated. No acute fracture or
dislocation evident. There is moderate osteoarthritic change in the
right hip joint, stable. No erosive changes are appreciable. Bony
overgrowth along the right acetabulum is noted. There is
calcification in the common and superficial femoral arteries
bilaterally.
IMPRESSION: No acute fracture or dislocation. Status post total hip replacement
on the left with prosthetic components on the left well seated.

Moderate arthropathy in the right hip joint, stable, with bony
overgrowth along the right acetabulum. This is a finding that
potentially may lead to femoroacetabular syndrome. No erosive
change. Multifocal arterial vascular calcification noted.

## 2020-05-23 DIAGNOSIS — R062 Wheezing: Secondary | ICD-10-CM | POA: Diagnosis not present

## 2020-05-23 DIAGNOSIS — J449 Chronic obstructive pulmonary disease, unspecified: Secondary | ICD-10-CM | POA: Diagnosis not present

## 2020-06-03 DIAGNOSIS — Z20822 Contact with and (suspected) exposure to covid-19: Secondary | ICD-10-CM | POA: Diagnosis not present

## 2020-06-10 ENCOUNTER — Encounter (HOSPITAL_COMMUNITY): Payer: Self-pay

## 2020-06-10 ENCOUNTER — Emergency Department (HOSPITAL_COMMUNITY): Payer: Medicare HMO

## 2020-06-10 ENCOUNTER — Inpatient Hospital Stay (HOSPITAL_COMMUNITY)
Admission: EM | Admit: 2020-06-10 | Discharge: 2020-06-13 | DRG: 304 | Disposition: A | Discharge status: Home Health | Payer: Medicare HMO | Attending: Internal Medicine | Admitting: Internal Medicine

## 2020-06-10 ENCOUNTER — Inpatient Hospital Stay (HOSPITAL_COMMUNITY): Payer: Medicare HMO

## 2020-06-10 ENCOUNTER — Observation Stay (HOSPITAL_COMMUNITY): Payer: Medicare HMO

## 2020-06-10 DIAGNOSIS — K219 Gastro-esophageal reflux disease without esophagitis: Secondary | ICD-10-CM | POA: Diagnosis present

## 2020-06-10 DIAGNOSIS — N400 Enlarged prostate without lower urinary tract symptoms: Secondary | ICD-10-CM | POA: Diagnosis present

## 2020-06-10 DIAGNOSIS — I10 Essential (primary) hypertension: Secondary | ICD-10-CM | POA: Diagnosis not present

## 2020-06-10 DIAGNOSIS — I6503 Occlusion and stenosis of bilateral vertebral arteries: Secondary | ICD-10-CM | POA: Diagnosis not present

## 2020-06-10 DIAGNOSIS — Z7984 Long term (current) use of oral hypoglycemic drugs: Secondary | ICD-10-CM

## 2020-06-10 DIAGNOSIS — G9340 Encephalopathy, unspecified: Secondary | ICD-10-CM

## 2020-06-10 DIAGNOSIS — G9341 Metabolic encephalopathy: Secondary | ICD-10-CM | POA: Diagnosis present

## 2020-06-10 DIAGNOSIS — R0902 Hypoxemia: Secondary | ICD-10-CM | POA: Diagnosis not present

## 2020-06-10 DIAGNOSIS — R4182 Altered mental status, unspecified: Secondary | ICD-10-CM | POA: Diagnosis not present

## 2020-06-10 DIAGNOSIS — Z8249 Family history of ischemic heart disease and other diseases of the circulatory system: Secondary | ICD-10-CM

## 2020-06-10 DIAGNOSIS — I6522 Occlusion and stenosis of left carotid artery: Secondary | ICD-10-CM | POA: Diagnosis not present

## 2020-06-10 DIAGNOSIS — I6389 Other cerebral infarction: Secondary | ICD-10-CM

## 2020-06-10 DIAGNOSIS — J449 Chronic obstructive pulmonary disease, unspecified: Secondary | ICD-10-CM | POA: Diagnosis present

## 2020-06-10 DIAGNOSIS — Z85828 Personal history of other malignant neoplasm of skin: Secondary | ICD-10-CM

## 2020-06-10 DIAGNOSIS — R404 Transient alteration of awareness: Secondary | ICD-10-CM | POA: Diagnosis not present

## 2020-06-10 DIAGNOSIS — Z79899 Other long term (current) drug therapy: Secondary | ICD-10-CM

## 2020-06-10 DIAGNOSIS — Z20822 Contact with and (suspected) exposure to covid-19: Secondary | ICD-10-CM | POA: Diagnosis present

## 2020-06-10 DIAGNOSIS — Z833 Family history of diabetes mellitus: Secondary | ICD-10-CM

## 2020-06-10 DIAGNOSIS — R4701 Aphasia: Secondary | ICD-10-CM | POA: Diagnosis present

## 2020-06-10 DIAGNOSIS — H547 Unspecified visual loss: Secondary | ICD-10-CM | POA: Diagnosis present

## 2020-06-10 DIAGNOSIS — I161 Hypertensive emergency: Secondary | ICD-10-CM | POA: Diagnosis not present

## 2020-06-10 DIAGNOSIS — I6783 Posterior reversible encephalopathy syndrome: Secondary | ICD-10-CM | POA: Diagnosis present

## 2020-06-10 DIAGNOSIS — Z87891 Personal history of nicotine dependence: Secondary | ICD-10-CM

## 2020-06-10 DIAGNOSIS — H53139 Sudden visual loss, unspecified eye: Secondary | ICD-10-CM

## 2020-06-10 DIAGNOSIS — G4489 Other headache syndrome: Secondary | ICD-10-CM | POA: Diagnosis not present

## 2020-06-10 DIAGNOSIS — I2510 Atherosclerotic heart disease of native coronary artery without angina pectoris: Secondary | ICD-10-CM | POA: Diagnosis present

## 2020-06-10 DIAGNOSIS — R29810 Facial weakness: Secondary | ICD-10-CM | POA: Diagnosis not present

## 2020-06-10 DIAGNOSIS — Z86010 Personal history of colonic polyps: Secondary | ICD-10-CM

## 2020-06-10 DIAGNOSIS — E1122 Type 2 diabetes mellitus with diabetic chronic kidney disease: Secondary | ICD-10-CM

## 2020-06-10 DIAGNOSIS — G459 Transient cerebral ischemic attack, unspecified: Secondary | ICD-10-CM | POA: Diagnosis not present

## 2020-06-10 DIAGNOSIS — R29818 Other symptoms and signs involving the nervous system: Secondary | ICD-10-CM | POA: Diagnosis not present

## 2020-06-10 DIAGNOSIS — Z9981 Dependence on supplemental oxygen: Secondary | ICD-10-CM

## 2020-06-10 DIAGNOSIS — Z825 Family history of asthma and other chronic lower respiratory diseases: Secondary | ICD-10-CM

## 2020-06-10 DIAGNOSIS — Z96642 Presence of left artificial hip joint: Secondary | ICD-10-CM | POA: Diagnosis present

## 2020-06-10 DIAGNOSIS — Z9049 Acquired absence of other specified parts of digestive tract: Secondary | ICD-10-CM

## 2020-06-10 DIAGNOSIS — G4730 Sleep apnea, unspecified: Secondary | ICD-10-CM | POA: Diagnosis present

## 2020-06-10 DIAGNOSIS — E785 Hyperlipidemia, unspecified: Secondary | ICD-10-CM | POA: Diagnosis present

## 2020-06-10 DIAGNOSIS — Z6841 Body Mass Index (BMI) 40.0 and over, adult: Secondary | ICD-10-CM

## 2020-06-10 DIAGNOSIS — E1165 Type 2 diabetes mellitus with hyperglycemia: Secondary | ICD-10-CM | POA: Diagnosis present

## 2020-06-10 LAB — COMPREHENSIVE METABOLIC PANEL
ALT: 38 U/L (ref 0–44)
AST: 22 U/L (ref 15–41)
Albumin: 3.6 g/dL (ref 3.5–5.0)
Alkaline Phosphatase: 63 U/L (ref 38–126)
Anion gap: 10 (ref 5–15)
BUN: 14 mg/dL (ref 8–23)
CO2: 27 mmol/L (ref 22–32)
Calcium: 8.8 mg/dL — ABNORMAL LOW (ref 8.9–10.3)
Chloride: 106 mmol/L (ref 98–111)
Creatinine, Ser: 1.18 mg/dL (ref 0.61–1.24)
GFR calc Af Amer: >60 mL/min (ref >60)
GFR calc non Af Amer: >60 mL/min (ref >60)
Glucose, Bld: 182 mg/dL — ABNORMAL HIGH (ref 70–99)
Potassium: 4 mmol/L (ref 3.5–5.1)
Sodium: 143 mmol/L (ref 135–145)
Total Bilirubin: 0.7 mg/dL (ref 0.3–1.2)
Total Protein: 6.2 g/dL — ABNORMAL LOW (ref 6.5–8.1)

## 2020-06-10 LAB — URINALYSIS, ROUTINE W REFLEX MICROSCOPIC
Bacteria, UA: NONE SEEN
Bilirubin Urine: NEGATIVE
Glucose, UA: 150 mg/dL — AB
Ketones, ur: NEGATIVE mg/dL
Leukocytes,Ua: NEGATIVE
Nitrite: NEGATIVE
Protein, ur: NEGATIVE mg/dL
Specific Gravity, Urine: 1.031 — ABNORMAL HIGH (ref 1.005–1.030)
pH: 5 (ref 5.0–8.0)

## 2020-06-10 LAB — RAPID URINE DRUG SCREEN, HOSP PERFORMED
Amphetamines: NOT DETECTED
Barbiturates: NOT DETECTED
Benzodiazepines: NOT DETECTED
Cocaine: NOT DETECTED
Opiates: NOT DETECTED
Tetrahydrocannabinol: NOT DETECTED

## 2020-06-10 LAB — I-STAT CHEM 8, ED
BUN: 15 mg/dL (ref 8–23)
Calcium, Ion: 1.15 mmol/L (ref 1.15–1.40)
Chloride: 104 mmol/L (ref 98–111)
Creatinine, Ser: 1.1 mg/dL (ref 0.61–1.24)
Glucose, Bld: 168 mg/dL — ABNORMAL HIGH (ref 70–99)
HCT: 42 % (ref 39.0–52.0)
Hemoglobin: 14.3 g/dL (ref 13.0–17.0)
Potassium: 3.7 mmol/L (ref 3.5–5.1)
Sodium: 145 mmol/L (ref 135–145)
TCO2: 31 mmol/L (ref 22–32)

## 2020-06-10 LAB — CBC
HCT: 45.1 % (ref 39.0–52.0)
Hemoglobin: 14.6 g/dL (ref 13.0–17.0)
MCH: 30.8 pg (ref 26.0–34.0)
MCHC: 32.4 g/dL (ref 30.0–36.0)
MCV: 95.1 fL (ref 80.0–100.0)
Platelets: 164 10*3/uL (ref 150–400)
RBC: 4.74 MIL/uL (ref 4.22–5.81)
RDW: 14.2 % (ref 11.5–15.5)
WBC: 7 10*3/uL (ref 4.0–10.5)
nRBC: 0 % (ref 0.0–0.2)

## 2020-06-10 LAB — DIFFERENTIAL
Abs Immature Granulocytes: 0.04 10*3/uL (ref 0.00–0.07)
Basophils Absolute: 0 10*3/uL (ref 0.0–0.1)
Basophils Relative: 1 %
Eosinophils Absolute: 0.1 10*3/uL (ref 0.0–0.5)
Eosinophils Relative: 1 %
Immature Granulocytes: 1 %
Lymphocytes Relative: 31 %
Lymphs Abs: 2.2 10*3/uL (ref 0.7–4.0)
Monocytes Absolute: 0.5 10*3/uL (ref 0.1–1.0)
Monocytes Relative: 6 %
Neutro Abs: 4.2 10*3/uL (ref 1.7–7.7)
Neutrophils Relative %: 60 %

## 2020-06-10 LAB — CBG MONITORING, ED
Glucose-Capillary: 180 mg/dL — ABNORMAL HIGH (ref 70–99)
Glucose-Capillary: 155 mg/dL — ABNORMAL HIGH (ref 70–99)
Glucose-Capillary: 112 mg/dL — ABNORMAL HIGH (ref 70–99)
Glucose-Capillary: 137 mg/dL — ABNORMAL HIGH (ref 70–99)

## 2020-06-10 LAB — LIPID PANEL
Cholesterol: 147 mg/dL (ref 0–200)
HDL: 36 mg/dL — ABNORMAL LOW (ref >40)
LDL Cholesterol: 90 mg/dL (ref 0–99)
Total CHOL/HDL Ratio: 4.1 RATIO
Triglycerides: 107 mg/dL (ref <150)
VLDL: 21 mg/dL (ref 0–40)

## 2020-06-10 LAB — RESPIRATORY PANEL BY RT PCR (FLU A&B, COVID)
Influenza A by PCR: NEGATIVE
Influenza B by PCR: NEGATIVE
SARS Coronavirus 2 by RT PCR: NEGATIVE

## 2020-06-10 LAB — ECHOCARDIOGRAM COMPLETE BUBBLE STUDY: Area-P 1/2: 2.39 cm2

## 2020-06-10 LAB — PROTIME-INR
INR: 1.1 (ref 0.8–1.2)
Prothrombin Time: 13.8 seconds (ref 11.4–15.2)

## 2020-06-10 LAB — HEMOGLOBIN A1C
Hgb A1c MFr Bld: 6.9 % — ABNORMAL HIGH (ref 4.8–5.6)
Mean Plasma Glucose: 151.33 mg/dL

## 2020-06-10 LAB — APTT: aPTT: 32 seconds (ref 24–36)

## 2020-06-10 LAB — GLUCOSE, CAPILLARY: Glucose-Capillary: 156 mg/dL — ABNORMAL HIGH (ref 70–99)

## 2020-06-10 MED ORDER — INSULIN ASPART 100 UNIT/ML ~~LOC~~ SOLN
0.0000 [IU] | SUBCUTANEOUS | Status: DC
  Administered 2020-06-10 (×3): 2 [IU] via SUBCUTANEOUS
  Administered 2020-06-10: 1 [IU] via SUBCUTANEOUS
  Administered 2020-06-11: 2 [IU] via SUBCUTANEOUS
  Administered 2020-06-11: 1 [IU] via SUBCUTANEOUS
  Administered 2020-06-11: 2 [IU] via SUBCUTANEOUS
  Administered 2020-06-11 – 2020-06-12 (×3): 1 [IU] via SUBCUTANEOUS
  Administered 2020-06-12: 2 [IU] via SUBCUTANEOUS
  Administered 2020-06-12 – 2020-06-13 (×2): 1 [IU] via SUBCUTANEOUS
  Administered 2020-06-13: 2 [IU] via SUBCUTANEOUS

## 2020-06-10 MED ORDER — METOPROLOL SUCCINATE ER 25 MG PO TB24
25.0000 mg | ORAL_TABLET | Freq: Every day | ORAL | Status: DC
  Administered 2020-06-11 – 2020-06-13 (×3): 25 mg via ORAL
  Filled 2020-06-10 (×4): qty 1

## 2020-06-10 MED ORDER — INSULIN ASPART 100 UNIT/ML ~~LOC~~ SOLN: 0.0000 [IU] | Freq: Every day | SUBCUTANEOUS | Status: DC

## 2020-06-10 MED ORDER — ALBUTEROL SULFATE HFA 108 (90 BASE) MCG/ACT IN AERS
2.0000 | INHALATION_SPRAY | Freq: Four times a day (QID) | RESPIRATORY_TRACT | Status: DC | PRN
  Filled 2020-06-10: qty 6.7

## 2020-06-10 MED ORDER — LORAZEPAM 2 MG/ML IJ SOLN
INTRAMUSCULAR | Status: AC
  Filled 2020-06-10: qty 1

## 2020-06-10 MED ORDER — SODIUM CHLORIDE 0.9% FLUSH
3.0000 mL | Freq: Once | INTRAVENOUS | Status: AC
  Administered 2020-06-10: 3 mL via INTRAVENOUS

## 2020-06-10 MED ORDER — HYDRALAZINE HCL 20 MG/ML IJ SOLN: 5.0000 mg | INTRAMUSCULAR | Status: DC | PRN

## 2020-06-10 MED ORDER — CLEVIDIPINE BUTYRATE 0.5 MG/ML IV EMUL: 0.0000 mg/h | INTRAVENOUS | Status: DC

## 2020-06-10 MED ORDER — ACETAMINOPHEN 325 MG PO TABS: 650.0000 mg | ORAL_TABLET | Freq: Four times a day (QID) | ORAL | Status: DC

## 2020-06-10 MED ORDER — IOHEXOL 350 MG/ML SOLN
100.0000 mL | Freq: Once | INTRAVENOUS | Status: AC | PRN
  Administered 2020-06-10: 100 mL via INTRAVENOUS

## 2020-06-10 MED ORDER — TAMSULOSIN HCL 0.4 MG PO CAPS
0.4000 mg | ORAL_CAPSULE | Freq: Every day | ORAL | Status: DC
  Administered 2020-06-10 – 2020-06-12 (×3): 0.4 mg via ORAL
  Filled 2020-06-10 (×3): qty 1

## 2020-06-10 MED ORDER — PERFLUTREN LIPID MICROSPHERE
1.0000 mL | INTRAVENOUS | Status: AC | PRN
  Administered 2020-06-10: 3 mL via INTRAVENOUS
  Filled 2020-06-10: qty 10

## 2020-06-10 MED ORDER — ASPIRIN 300 MG RE SUPP: 300.0000 mg | Freq: Every day | RECTAL | Status: DC

## 2020-06-10 MED ORDER — HYDRALAZINE HCL 20 MG/ML IJ SOLN: 5.0000 mg | Freq: Four times a day (QID) | INTRAMUSCULAR | Status: DC | PRN

## 2020-06-10 MED ORDER — INSULIN ASPART 100 UNIT/ML ~~LOC~~ SOLN: 0.0000 [IU] | Freq: Three times a day (TID) | SUBCUTANEOUS | Status: DC

## 2020-06-10 MED ORDER — LORAZEPAM 2 MG/ML IJ SOLN
1.0000 mg | Freq: Once | INTRAMUSCULAR | Status: AC
  Administered 2020-06-10: 1 mg via INTRAVENOUS

## 2020-06-10 MED ORDER — PRAVASTATIN SODIUM 10 MG PO TABS
20.0000 mg | ORAL_TABLET | Freq: Every day | ORAL | Status: DC
  Administered 2020-06-10 – 2020-06-13 (×4): 20 mg via ORAL
  Filled 2020-06-10 (×4): qty 2

## 2020-06-10 MED ORDER — VITAMIN D 25 MCG (1000 UNIT) PO TABS
1000.0000 [IU] | ORAL_TABLET | Freq: Every day | ORAL | Status: DC
  Administered 2020-06-10 – 2020-06-13 (×4): 1000 [IU] via ORAL
  Filled 2020-06-10 (×4): qty 1

## 2020-06-10 NOTE — ED Triage Notes (Signed)
Patient arrived via EMS with code stroke activated; Pt arrived with right gaze and no speaking on arrival; LKW was at 1700 when patient c/o sudden HA and laid down; Pt woke spouse of around 2230 with confusion and c/o can't see; Patient normally A&Ox 4 and ambulatory; EMS pressure at 220/80;-Monique,RN

## 2020-06-10 NOTE — ED Notes (Signed)
Admitting Provider at bedside. 

## 2020-06-10 NOTE — Evaluation (Signed)
Physical Therapy Evaluation Patient Details Name: Alfred Carney MRN: [id number] DOB: 26-Jul-1951 Today's Date: 06/10/2020   History of Present Illness  The pt is a 69 yo male presenting with AMS, headaches, weakness, and worsening command following. Code stroke was activated upon arrival, MRI showed "signalabnormality involving the bilateral parietal and occipital regions,left slightly worse than right, most consistent with PRES." PMH includes: COPD, CAD, DM II, GERD, HLD, HTN, and morbid obesity.    Clinical Impression  Pt in bed upon arrival of PT, agreeable to evaluation at this time. Prior to admission the pt was mobilizing at home without use of AD or need for assist with ADLs other than assist with donning of shoes/socks. The pt is on 2L O2 at baseline, and is typically able to mobilize with SpO2 >95%. The pt now presents with limitations in functional mobility, power, activity tolerance, and dynamic stability due to above dx, and will continue to benefit from skilled PT to address these deficits. The pt was able to complete bed mobility and transfers with assist of 1-2 for safety on ED stretcher, and take short lateral steps with HHA of 2 for stability. The pt's mobility and safety is further limited by his acute change in vision which impacts his ability to safely avoid obstacles or navigate without assist. Given the lack of 24/7 supervision at home (but good local family support), I recommend continued in-patient therapies following d/c to facilitate improvements in functional strength, stability and eventual safe return home.      Follow Up Recommendations CIR    Equipment Recommendations  None recommended by PT    Recommendations for Other Services Rehab consult     Precautions / Restrictions Precautions Precautions: Fall Precaution Comments: watch BP Restrictions Weight Bearing Restrictions: No      Mobility  Bed Mobility Overal bed mobility: Needs Assistance Bed Mobility:  Supine to Sit;Sit to Supine     Supine to sit: Mod assist;+2 for safety/equipment Sit to supine: Mod assist   General bed mobility comments: modA without use of bed rail on ED stretcher, assist to BLE to pivot to sitting EOB, modA to maintain trunk stability on uneven surface. modA to transition back to bed, pt able to pull himself up using rails  Transfers Overall transfer level: Needs assistance Equipment used: 2 person hand held assist Transfers: Sit to/from Stand Sit to Stand: Min assist;+2 safety/equipment;From elevated surface         General transfer comment: could likely progress to minA of 1, HHA more to steady than power up, slight posterior lean, bracing on bed with static stance  Ambulation/Gait Ambulation/Gait assistance: Min assist;+2 safety/equipment Gait Distance (Feet): 3 Feet Assistive device: 2 person hand held assist Gait Pattern/deviations: Step-to pattern Gait velocity: decreased   General Gait Details: small lateral steps to [facility] due to pt reports of fatigue following continued stand at EOB. minA to steady with stepping but no LOB.  Modified Rankin (Stroke Patients Only) Modified Rankin (Stroke Patients Only) Pre-Morbid Rankin Score: Moderate disability Modified Rankin: Moderately severe disability     Balance Overall balance assessment: Needs assistance Sitting-balance support: Bilateral upper extremity supported;Feet unsupported Sitting balance-Leahy Scale: Poor Sitting balance - Comments: minA to minG sitting EOB, BUE support   Standing balance support: Bilateral upper extremity supported;During functional activity Standing balance-Leahy Scale: Poor Standing balance comment: minA through HHA of 2 to maintain standing  Pertinent Vitals/Pain Pain Assessment: 0-10 Pain Score: 6  Pain Location: back with bed mobility (reports recent back surgery) Pain Descriptors / Indicators: Sore;Grimacing Pain  Intervention(s): Limited activity within patient's tolerance;Monitored during session    Home Living Family/patient expects to be discharged to:: Private residence Living Arrangements: Spouse/significant other Available Help at Discharge: Family;Available PRN/intermittently (children live locally, not able to provide 24/7 care. wife with recent surgery, unable to phsyically assist) Type of Home: House Home Access: Stairs to enter Entrance Stairs-Rails: Can reach both Entrance Stairs-Number of Steps: 3 Home Layout: One level Home Equipment: Walker - 2 wheels;Walker - 4 wheels;Cane - single point;Shower seat;Grab bars - tub/shower      Prior Function Level of Independence: Needs assistance   Gait / Transfers Assistance Needed: mostly independent without need for AD, RW as needed  ADL's / Homemaking Assistance Needed: wife assist with sock donning        Hand Dominance   Dominant Hand: Right    Extremity/Trunk Assessment   Upper Extremity Assessment Upper Extremity Assessment: Defer to OT evaluation    Lower Extremity Assessment Lower Extremity Assessment: Overall WFL for tasks assessed (strength grossly equal bilaterally, no reports of difference in sensation.)       Communication   Communication: HOH (pt reports both ears equally impaired, was going to get cataracts surgery prior to this admission)  Cognition Arousal/Alertness: Awake/alert Behavior During Therapy: WFL for tasks assessed/performed Overall Cognitive Status: Impaired/Different from baseline Area of Impairment: Orientation;Memory;Following commands;Problem solving                 Orientation Level: Disoriented to;Situation   Memory: Decreased short-term memory         General Comments: pt awareness and orientation waxing and waning per son who was present for eval. Pt with some inconsistent answers during history taking (answering "yes" to "is your shower a tub shower or a walk-in shower?") but  able to describe other sx such as vision deficits well with good awareness.      General Comments General comments (skin integrity, edema, etc.): son present and supportive    Exercises     Assessment/Plan    PT Assessment Patient needs continued PT services  PT Problem List Obesity;Decreased safety awareness;Decreased activity tolerance;Decreased balance;Decreased mobility       PT Treatment Interventions DME instruction;Balance training;Gait training;Stair training;Functional mobility training;Therapeutic activities;Therapeutic exercise;Patient/family education    PT Goals (Current goals can be found in the Care Plan section)  Acute Rehab PT Goals Patient Stated Goal: determine cause of vision change PT Goal Formulation: With patient Time For Goal Achievement: 06/24/20 Potential to Achieve Goals: Good    Frequency Min 3X/week   Barriers to discharge Decreased caregiver support      Co-evaluation PT/OT/SLP Co-Evaluation/Treatment: Yes Reason for Co-Treatment: Complexity of the patient's impairments (multi-system involvement);For patient/therapist safety;To address functional/ADL transfers PT goals addressed during session: Mobility/safety with mobility;Balance;Strengthening/ROM         AM-PAC PT "6 Clicks" Mobility  Outcome Measure Help needed turning from your back to your side while in a flat bed without using bedrails?: A Little Help needed moving from lying on your back to sitting on the side of a flat bed without using bedrails?: A Lot Help needed moving to and from a bed to a chair (including a wheelchair)?: A Lot Help needed standing up from a chair using your arms (e.g., wheelchair or bedside chair)?: A Little Help needed to walk in hospital room?: A Lot Help needed climbing  3-5 steps with a railing? : A Lot 6 Click Score: 14    End of Session Equipment Utilized During Treatment: Gait belt;Oxygen (2L O2 (pt's baseline)) Activity Tolerance: Patient tolerated  treatment well;Other (comment) (tranport present waiting for pt) Patient left: in bed Nurse Communication: Mobility status PT Visit Diagnosis: Difficulty in walking, not elsewhere classified (R26.2)    Time: 2119-4174 PT Time Calculation (min) (ACUTE ONLY): 33 min   Charges:   PT Evaluation $PT Eval Moderate Complexity: 1 Mod          Karma Ganja, PT, DPT   Acute Rehabilitation Department Pager #: [phone]  Otho Bellows 06/10/2020, 11:08 AM

## 2020-06-10 NOTE — Progress Notes (Signed)
EEG complete - results pending 

## 2020-06-10 NOTE — ED Notes (Signed)
CBG 170; MRN not crossed over

## 2020-06-10 NOTE — ED Notes (Signed)
Patient transported to MRI 

## 2020-06-10 NOTE — Progress Notes (Signed)
New Admission Note:  Arrival Method: pt arrived via stretcher from [facility] ED Mental Orientation: Alert and oriented Telemetry: yes on box #7 Assessment: Completed Skin: dry and intact IV: Rt AC SL Pain: No complaints of pain Safety Measures: Safety Fall Prevention Plan was given, discussed. 3W: Patient has been orientated to the room, unit and the staff. Family:Pt has daughter at bedside that is pleasant and cooperative.  Orders have been reviewed and implemented. Will continue to monitor the patient. Call light has been placed within reach and bed alarm has been activated.   Janus Molder ,RN

## 2020-06-10 NOTE — Consult Note (Addendum)
Referring Physician: Dr. Betsey Holiday    Chief Complaint: Acute loss of vision and mutism  HPI: Keyonte Cookston is an 69 y.o. male with a PMHx of COPD, CAD, DM2, GERD, fatty liver, HLD, HTN, morbid obesity, s/p repair of thoracoabdominal aortic aneurysm and sleep apnea who presents to the ED from home via EMS as a Code Stroke. He was having a headache early this evening prior to taking a nap, but was otherwise at his baseline. On awakening at 11:30 PM, he complained to his wife that he could not see. His speech then rapidly became less intelligible. EMS was called and on arrival, the patient could only say "what" in response to questions and was not following commands. He did not seem to be agitated or in pain. En route his BP was 220/80 with CBG of 165. On arrival to the ED his BP had further increased to 240/114. He was completely noverbal on arrival to the ED but with eyes open. STAT CT head was obtained, which revealed no acute abnormality.    He has no history of stroke or seizure. He is not on a blood thinner.   LSN: [id number] tPA Given: No: Rapidly improving; CTP without perfusion deficit  Past Medical History:  Diagnosis Date  . Chronic obstructive pulmonary disease ([place]) 2008   Moderate  . Colon polyps   . Community acquired pneumonia   . Coronary artery disease   . Degenerative joint disease   . Diabetes mellitus without complication ([place])    type 2  . Emphysema lung ([place])   . Enlarged liver    fatty liver by '09 CT  . Gastroesophageal reflux disease   . Gout   . H/O hiatal hernia   . History of Rocky [place] spotted fever    Possible history of [place] Spotted Fever  . Hyperlipidemia   . Hypertension   . Hypokalemia    diuretic induced, resolved  . Microcytic anemia    iron pills  . Morbid obesity ([place])   . Shortness of breath   . Skin cancer    skin cancer lip removed  . Sleep apnea    not currently using CPAP 05/20/13  . 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 CARDIAC CATHETERIZATION  2007   Ejection fraction is estimated at 60%  . CHOLECYSTECTOMY    . COLONOSCOPY WITH PROPOFOL N/A 01/22/2018   Procedure: COLONOSCOPY WITH PROPOFOL;  Surgeon: Mauri Pole, MD;  Location: [facility];  Service: Endoscopy;  Laterality: N/A;  . DG NERVE ROOT BLOCK LUMBAR-SACRAL EACH ADD. LEVEL  10/23/2018      . ESOPHAGOGASTRODUODENOSCOPY (EGD) WITH PROPOFOL N/A 01/22/2018   Procedure: ESOPHAGOGASTRODUODENOSCOPY (EGD) WITH PROPOFOL;  Surgeon: Mauri Pole, MD;  Location: [facility];  Service: Endoscopy;  Laterality: N/A;  . HARDWARE REMOVAL Left 05/26/2013   Procedure: HARDWARE REMOVAL;  Surgeon: Newt Minion, MD;  Location: [facility];  Service: Orthopedics;  Laterality: Left;  Left Total Hip Arthroplasty, Removal of Deep Hardware  . HIP SURGERY     Status post left hip surgery with bone grafting  . IR INJECT/THERA/INC NEEDLE/CATH/PLC EPI/LUMB/SAC W/IMG  10/23/2018  . LEFT HEART CATHETERIZATION WITH CORONARY ANGIOGRAM N/A 01/31/2014   Procedure: LEFT HEART CATHETERIZATION WITH CORONARY ANGIOGRAM;  Surgeon: Burnell Blanks, MD;  Location: [facility] CATH LAB;  Service: Cardiovascular;  Laterality: N/A;  . LUMBAR LAMINECTOMY/DECOMPRESSION MICRODISCECTOMY Left 10/28/2018   Procedure:  Left Lumbar three-four Extraforaminal microdiscectomy;  Surgeon: Jovita Gamma, MD;  Location: [place];  Service: Neurosurgery;  Laterality: Left;  . THORACOABDOMINAL AORTIC ANEURYSM REPAIR     with right femoral and left iliac BPG and reimplantation of renal arteries.  . TONSILLECTOMY    . TONSILLECTOMY    . TOTAL HIP ARTHROPLASTY Left 05/26/2013   Procedure: TOTAL HIP ARTHROPLASTY;  Surgeon: Newt Minion, MD;  Location: [place];  Service: Orthopedics;  Laterality: Left;  Left Total Hip Arthroplasty, Removal Deep Hardware    Family History  Problem Relation Age of Onset  . Osteoarthritis Mother   .  Diabetes Mother   . Pulmonary embolism Mother   . Heart disease Father        Coronary Artery Disease  . Stroke Father   . Multiple sclerosis Sister   . Factor V Leiden deficiency Sister   . Emphysema Sister   . Hyperlipidemia Brother   . Lung cancer Maternal Grandfather        smoked  . Heart attack Maternal Grandmother    Social History:  reports that he quit smoking about 13 years ago. His smoking use included cigarettes. He has a 35.00 pack-year smoking history. He has never used smokeless tobacco. He reports that he does not drink alcohol and does not use drugs.  Allergies: No Known Allergies  Home Medications:  No current facility-administered medications on file prior to encounter.   Current Outpatient Medications on File Prior to Encounter  Medication Sig Dispense Refill  . acetaminophen (TYLENOL) 325 MG tablet Take 2 tablets (650 mg total) by mouth every 6 (six) hours.    Marland Kitchen albuterol (PROVENTIL HFA;VENTOLIN HFA) 108 (90 Base) MCG/ACT inhaler Inhale 2 puffs into the lungs every 6 (six) hours as needed for wheezing or shortness of breath. 1 Inhaler 6  . Calcium Carbonate-Simethicone (ALKA-SELTZER HEARTBURN + GAS) 750-80 MG CHEW Chew 1 tablet by mouth as needed (gas).     . Cholecalciferol (VITAMIN D PO) Take 1 capsule by mouth daily.     . furosemide (LASIX) 20 MG tablet Take 1 tablet by mouth once daily 90 tablet 0  . glipiZIDE (GLUCOTROL) 5 MG tablet Take 1 tablet (5 mg total) by mouth 2 (two) times daily before a meal. 180 tablet 2  . irbesartan (AVAPRO) 300 MG tablet Take 1 tablet (300 mg total) by mouth daily. 90 tablet 2  . ketoconazole (NIZORAL) 2 % shampoo APPLY ONE APPLICATION TOPICALLY AS NEEDED FOR IRRITATION 120 mL 1  . metoprolol succinate (TOPROL-XL) 25 MG 24 hr tablet Take 1 tablet by mouth once daily 90 tablet 3  . nitroGLYCERIN (NITROSTAT) 0.4 MG SL tablet Place 1 tablet (0.4 mg total) under the tongue every 5 (five) minutes x 3 doses as needed for chest pain.  25 tablet 0  . oxyCODONE (OXY IR/ROXICODONE) 5 MG immediate release tablet Take 1 tablet (5 mg total) by mouth every 6 (six) hours as needed for breakthrough pain. 15 tablet 0  . OXYGEN Inhale 2 L into the lungs continuous.     . pravastatin (PRAVACHOL) 20 MG tablet Take 1 tablet (20 mg total) by mouth daily. 90 tablet 3  . tamsulosin (FLOMAX) 0.4 MG CAPS capsule Take 1 capsule by mouth once daily at bedtime 90 capsule 3    ROS: Unable to obtain due to AMS.   Physical Examination: There were no vitals taken for this visit.  HEENT: [place]/AT Lungs: Respirations unlabored Ext: Distal BLE with pitting edema and chronic  pigmentary changes  Neurologic Examination: Mental Status: Awake but unresponsive to verbal input, not answering any questions or following any commands. Does not fixate visually but eyes will move conjugately to the left and then right. Some purposeful movement seen with BUE (wiped his eye) and folded arms over his chest, interlocking his fingers.  Cranial Nerves: II:  No blink to threat. Did not fixate visually. Pupils 2 mm and equally reactive.  III,IV, VI: No ptosis. Moves eyes horizontally from left to right, then back spontaneously Irregular fine nystagmus was seen with eyes in midposition.  V,VII: Unable to formally assess facial sensation. Face symmetric.  VIII: No response to voice IX,X: Did not open mouth for assessment.  XI: Head is midline XII: Did not protrude tongue Motor/Sensory: Does not resist examiner to command but did fold arms over chest semipurposfully. Also with some withdrawal to pinch applied to each arm.  Withdrew BLE to plantar stimulation. Briefly elevated RLE antigravity during noxious stimulation.  Deep Tendon Reflexes:  Difficult to elicit reflexes x 4 in the context of adiposity Plantars: Right: downgoing   Left: equivocal Cerebellar/Gait: Unable to assess  Follow up exam: After CT head was completed, the patient became verbal, stating  fluently and without dysarthria that he needed to have a BM right away or he was going to lose control of his bowels. He was then able to follow commands and agreed to lay still for CTA.   No results found for this or any previous visit (from the past 48 hour(s)). No results found.  Assessment: 69 y.o. male presenting with acute onset of visual loss and mutism 1. Exam findings as documented above were most consistent with either an acute encephalopathy or unusual presentation of brainstem stroke (nystagmus was noted). Speech recovered after CT head and prior to obtaining CTA.  2. CT head with no acute abnormality. ASPECTS 10.  3. CTA head/neck with CTP shows no LVO or perfusion deficit. 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  4. Stroke Risk Factors - CAD, DM2, HLD, HTN, morbid obesity and sleep apnea 5. No indication for tPA as following CT head the patient was rapidly improving; CTP without perfusion deficit. Presentation more consistent with an acute encephalopathy secondary to a non-ischemic etiology. DDx includes PRES as he was initially with vision loss at symptom onset and BP was markedly elevated. Subclinical seizure during sleep followed by postictal state is also on the DDx.   Recommendations: 1. EEG in AM (ordered) 2. TTE 3. MRI brain to assess for possible PRES (initially with vision loss at symptom onset and BP was markedly elevated).  4. PT consult, OT consult, Speech consult 5. Cardiac telemetry 6. Start ASA 81 mg po qd after able to take PO (started on 300 mg ASA suppository in the ED) 7. Frequent neuro checks  Addendum: -- MRI brain completed and preliminarily assessed by Neurologist. Patchy FLAIR signal abnormalities in the occipital lobes appears most consistent with mild PRES. -- PRES patients  who present with elevated blood pressure should be treated as hypertensive emergencies. Therefore it is recommended that blood pressure should be reduced by 25 percent within the first few hours of treatment. Recommend BP management with goal SBP of 140 - 160 over the next several hours, then decrease by 15% per day to a SBP goal of 120-140.  -- Of note, the  patient is not a permissive HTN candidate as severe acute HTN is the most likely etiology for this patient's presentation with PRES.  -- Clevidipine gtt has been ordered.   @Electronically  signed: Dr. Kerney Elbe  06/10/2020, 12:39 AM

## 2020-06-10 NOTE — ED Provider Notes (Signed)
[facility] EMERGENCY DEPARTMENT Provider Note   CSN: [id number] Arrival date & time: 06/10/20  8588  An emergency department physician performed an initial assessment on this suspected stroke patient at [facility].  History Chief Complaint  Patient presents with  . Code Stroke    Olajuwon Fosdick is a 69 y.o. male.  Patient brought to the emergency department from home for altered mental status.  Patient reportedly complained of a headache and then went down for a nap around 5 PM.  He woke up at 1130 at which time his wife noticed that he seemed very confused.  He has worsened over time.  EMS report that in their care patient has seemed to be staring off to the right and not responding.  He cannot follow any commands.  All 4 extremities seem weak, no clear unilateral findings.  Patient brought to the emergency department as a code stroke.  Patient was evaluated immediately upon arrival at the bridge.        Past Medical History:  Diagnosis Date  . Chronic obstructive pulmonary disease ([place]) 2008   Moderate  . Colon polyps   . Community acquired pneumonia   . Coronary artery disease   . Degenerative joint disease   . Diabetes mellitus without complication ([place])    type 2  . Emphysema lung ([place])   . Enlarged liver    fatty liver by '09 CT  . Gastroesophageal reflux disease   . Gout   . H/O hiatal hernia   . History of [place] spotted fever    Possible history of [place] Spotted Fever  . Hyperlipidemia   . Hypertension   . Hypokalemia    diuretic induced, resolved  . Microcytic anemia    iron pills  . Morbid obesity ([place])   . Shortness of breath   . Skin cancer    skin cancer lip removed  . Sleep apnea    not currently using CPAP 05/20/13  . 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 Leukocytosis 10/21/2019  . Abdominal aortic aneurysm, ruptured ([place])  10/21/2019  . Tailbone injury, initial encounter 02/15/2019  . Chronic respiratory failure with hypoxia and hypercapnia ([place]) 02/05/2019  . Dandruff in adult 11/04/2018  . Benign prostatic hyperplasia 11/04/2018  . Type 2 diabetes mellitus with stage 2 chronic kidney disease, without long-term current use of insulin ([place]) 11/04/2018  . S/P discectomy 11/04/2018  . Back pain 10/22/2018  . Left hip pain 10/21/2018  . Solitary pulmonary nodule on lung CT 01/29/2018  . Polyp of cecum   . Dyspnea on exertion 11/19/2017  . Chronic respiratory failure with hypoxia ([place]) 11/19/2017  . COPD GOLD II 05/17/2015  . Morbid obesity due to excess calories ([place]) 05/17/2015  . 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 Chest pain, negative MI, stable CT angio of chest, stable non obstructive CAD -possible GI cause 01/28/2014  . CAD in native artery 09/25/2011  . PAD (peripheral artery disease) ([place]) 09/25/2011  . Hyperlipidemia 09/25/2011  . Essential hypertension 09/25/2011  . ABDOMINAL AORTIC ANEURYSM, HX OF 06/30/2007    Past Surgical History:  Procedure Laterality Date  . CARDIAC CATHETERIZATION  2007   Ejection fraction is estimated at 60%  . CHOLECYSTECTOMY    . COLONOSCOPY WITH PROPOFOL N/A 01/22/2018   Procedure: COLONOSCOPY WITH PROPOFOL;  Surgeon: Harl Bowie  V, MD;  Location: [facility];  Service: Endoscopy;  Laterality: N/A;  . DG NERVE ROOT BLOCK LUMBAR-SACRAL EACH ADD. LEVEL  10/23/2018      . ESOPHAGOGASTRODUODENOSCOPY (EGD) WITH PROPOFOL N/A 01/22/2018   Procedure: ESOPHAGOGASTRODUODENOSCOPY (EGD) WITH PROPOFOL;  Surgeon: Mauri Pole, MD;  Location: [facility];  Service: Endoscopy;  Laterality: N/A;  . HARDWARE REMOVAL Left 05/26/2013   Procedure: HARDWARE REMOVAL;  Surgeon: Newt Minion, MD;  Location: [facility];  Service: Orthopedics;  Laterality: Left;  Left Total Hip Arthroplasty, Removal of Deep Hardware  . HIP SURGERY       Status post left hip surgery with bone grafting  . IR INJECT/THERA/INC NEEDLE/CATH/PLC EPI/LUMB/SAC W/IMG  10/23/2018  . LEFT HEART CATHETERIZATION WITH CORONARY ANGIOGRAM N/A 01/31/2014   Procedure: LEFT HEART CATHETERIZATION WITH CORONARY ANGIOGRAM;  Surgeon: Burnell Blanks, MD;  Location: [facility] CATH LAB;  Service: Cardiovascular;  Laterality: N/A;  . LUMBAR LAMINECTOMY/DECOMPRESSION MICRODISCECTOMY Left 10/28/2018   Procedure: Left Lumbar three-four Extraforaminal microdiscectomy;  Surgeon: Jovita Gamma, MD;  Location: [facility];  Service: Neurosurgery;  Laterality: Left;  . THORACOABDOMINAL AORTIC ANEURYSM REPAIR     with right femoral and left iliac BPG and reimplantation of renal arteries.  . TONSILLECTOMY    . TONSILLECTOMY    . TOTAL HIP ARTHROPLASTY Left 05/26/2013   Procedure: TOTAL HIP ARTHROPLASTY;  Surgeon: Newt Minion, MD;  Location: [facility];  Service: Orthopedics;  Laterality: Left;  Left Total Hip Arthroplasty, Removal Deep Hardware       Family History  Problem Relation Age of Onset  . Osteoarthritis Mother   . Diabetes Mother   . Pulmonary embolism Mother   . Heart disease Father        Coronary Artery Disease  . Stroke Father   . Multiple sclerosis Sister   . Factor V Leiden deficiency Sister   . Emphysema Sister   . Hyperlipidemia Brother   . Lung cancer Maternal Grandfather        smoked  . Heart attack Maternal Grandmother     Social History   Tobacco Use  . Smoking status: Former Smoker    Packs/day: 1.00    Years: 35.00    Pack years: 35.00    Types: Cigarettes    Quit date: 09/16/2006    Years since quitting: 13.7  . Smokeless tobacco: Never Used  Vaping Use  . Vaping Use: Never used  Substance Use Topics  . Alcohol use: No    Alcohol/week: 0.0 standard drinks  . Drug use: No    Home Medications Prior to Admission medications   Medication Sig Start Date End Date Taking? Authorizing Provider  acetaminophen (TYLENOL) 325 MG tablet Take 2  tablets (650 mg total) by mouth every 6 (six) hours. 10/30/18   Elgergawy, Silver Huguenin, MD  albuterol (PROVENTIL HFA;VENTOLIN HFA) 108 (90 Base) MCG/ACT inhaler Inhale 2 puffs into the lungs every 6 (six) hours as needed for wheezing or shortness of breath. 01/06/18   Tanda Rockers, MD  Calcium Carbonate-Simethicone (ALKA-SELTZER HEARTBURN + GAS) 750-80 MG CHEW Chew 1 tablet by mouth as needed (gas).     [provider]  Cholecalciferol (VITAMIN D PO) Take 1 capsule by mouth daily.     [provider]  furosemide (LASIX) 20 MG tablet Take 1 tablet by mouth once daily 05/02/20   Lesleigh Noe, MD  glipiZIDE (GLUCOTROL) 5 MG tablet Take 1 tablet (5 mg total) by mouth 2 (two) times daily before  a meal. 04/29/19   Lesleigh Noe, MD  irbesartan (AVAPRO) 300 MG tablet Take 1 tablet (300 mg total) by mouth daily. 12/06/19   Lesleigh Noe, MD  ketoconazole (NIZORAL) 2 % shampoo APPLY ONE APPLICATION TOPICALLY AS NEEDED FOR IRRITATION 02/03/20   Lesleigh Noe, MD  metoprolol succinate (TOPROL-XL) 25 MG 24 hr tablet Take 1 tablet by mouth once daily 11/15/19   Lesleigh Noe, MD  nitroGLYCERIN (NITROSTAT) 0.4 MG SL tablet Place 1 tablet (0.4 mg total) under the tongue every 5 (five) minutes x 3 doses as needed for chest pain. 01/04/19   Lesleigh Noe, MD  oxyCODONE (OXY IR/ROXICODONE) 5 MG immediate release tablet Take 1 tablet (5 mg total) by mouth every 6 (six) hours as needed for breakthrough pain. 11/04/18   Lesleigh Noe, MD  OXYGEN Inhale 2 L into the lungs continuous.     [provider]  pravastatin (PRAVACHOL) 20 MG tablet Take 1 tablet (20 mg total) by mouth daily. 10/21/19   Lesleigh Noe, MD  tamsulosin (FLOMAX) 0.4 MG CAPS capsule Take 1 capsule by mouth once daily at bedtime 01/18/20   Lesleigh Noe, MD    Allergies    Patient has no known allergies.  Review of Systems   Review of Systems  Unable to perform ROS: Acuity of condition    Physical Exam Updated  Vital Signs BP 136/65   Pulse (!) 55   Temp 98.1 F (36.7 C) (Oral)   Resp 18   SpO2 97%   Physical Exam Vitals and nursing note reviewed.  Constitutional:      General: He is not in acute distress.    Appearance: Normal appearance. He is well-developed. He is obese.  HENT:     Head: Normocephalic and atraumatic.     Right Ear: Hearing normal.     Left Ear: Hearing normal.     Nose: Nose normal.  Eyes:     Conjunctiva/sclera: Conjunctivae normal.     Pupils: Pupils are equal, round, and reactive to light.  Cardiovascular:     Rate and Rhythm: Regular rhythm.     Heart sounds: S1 normal and S2 normal. No murmur heard.  No friction rub. No gallop.   Pulmonary:     Effort: Pulmonary effort is normal. No respiratory distress.     Breath sounds: Normal breath sounds.  Chest:     Chest wall: No tenderness.  Abdominal:     General: Bowel sounds are normal.     Palpations: Abdomen is soft.     Tenderness: There is no abdominal tenderness. There is no guarding or rebound. Negative signs include Murphy's sign and McBurney's sign.     Hernia: No hernia is present.  Musculoskeletal:        General: No deformity.     Cervical back: Normal range of motion and neck supple.     Comments: Some spontaneous movement of upper extremities noted, right greater than left  Skin:    General: Skin is warm and dry.     Findings: No rash.  Neurological:     Mental Status: He is alert.     GCS: GCS eye subscore is 4. GCS verbal subscore is 1. GCS motor subscore is 5.  Psychiatric:        Speech: Speech normal.        Behavior: Behavior normal.        Thought Content: Thought content normal.  ED Results / Procedures / Treatments   Labs (all labs ordered are listed, but only abnormal results are displayed) Labs Reviewed  COMPREHENSIVE METABOLIC PANEL - Abnormal; Notable for the following components:      Result Value   Glucose, Bld 182 (*)    Calcium 8.8 (*)    Total Protein 6.2 (*)      All other components within normal limits  URINALYSIS, ROUTINE W REFLEX MICROSCOPIC - Abnormal; Notable for the following components:   Color, Urine STRAW (*)    Specific Gravity, Urine 1.031 (*)    Glucose, UA 150 (*)    Hgb urine dipstick MODERATE (*)    All other components within normal limits  I-STAT CHEM 8, ED - Abnormal; Notable for the following components:   Glucose, Bld 168 (*)    All other components within normal limits  RESPIRATORY PANEL BY RT PCR (FLU A&B, COVID)  PROTIME-INR  APTT  CBC  DIFFERENTIAL  RAPID URINE DRUG SCREEN, HOSP PERFORMED  CBG MONITORING, ED    EKG None  Radiology CT Code Stroke CTA Head W/WO contrast  Result Date: 06/10/2020 CLINICAL DATA:  Initial evaluation for acute neuro deficit, stroke suspected, right-sided weakness, visual loss. EXAM: CT ANGIOGRAPHY HEAD AND NECK CT PERFUSION BRAIN TECHNIQUE: Multidetector CT imaging of the head and neck was performed using the standard protocol during bolus administration of intravenous contrast. Multiplanar CT image reconstructions and MIPs were obtained to evaluate the vascular anatomy. Carotid stenosis measurements (when applicable) are obtained utilizing NASCET criteria, using the distal internal carotid diameter as the denominator. Multiphase CT imaging of the brain was performed following IV bolus contrast injection. Subsequent parametric perfusion maps were calculated using RAPID software. CONTRAST:  140mL OMNIPAQUE IOHEXOL 350 MG/ML SOLN COMPARISON:  Prior head CT from earlier the same day. FINDINGS: CTA NECK FINDINGS Aortic arch: Visualized aortic arch of normal caliber with normal 3 vessel morphology. Moderate atheromatous change about the arch and origin of the great vessels without hemodynamically significant stenosis. Right carotid system: Right CCA patent from its origin to the bifurcation without stenosis. Scattered calcified plaque about the right bifurcation/proximal right ICA without significant  stenosis. Additional scattered eccentric calcified plaque within the distal right ICA just prior to the skull base with no more than mild stenosis of up to 35% by NASCET criteria. No dissection or other acute vascular abnormality. Left carotid system: Left CCA patent from its origin to the bifurcation without stenosis. Extensive bulky calcified plaque about the left bifurcation/proximal left ICA with associated stenosis of up to approximately 70% by NASCET criteria. Additional scattered eccentric calcified plaque within the distal cervical left ICA just prior to the skull base without hemodynamically significant stenosis. No dissection or other acute vascular abnormality. Vertebral arteries: Both vertebral arteries arise from the subclavian arteries. No hemodynamically significant proximal subclavian artery stenosis. Atheromatous change at the origin of the right vertebral artery with no more than mild stenosis. Proximal vertebral arteries not well assessed due to motion, but grossly patent without abnormality. Visualized portions of the vertebral arteries patent without dissection, stenosis, or occlusion. Skeleton: No acute osseous abnormality. No discrete or worrisome osseous lesions. Patient is edentulous. Other neck: No other acute soft tissue abnormality within the neck. No mass lesion or adenopathy. Upper chest: Visualized upper chest demonstrates no acute finding. Scattered atelectatic changes noted within the visualized lungs. Review of the MIP images confirms the above findings CTA HEAD FINDINGS Anterior circulation: Petrous segments patent bilaterally. Scattered atheromatous plaque throughout the cavernous/supraclinoid  ICAs with associated mild to moderate multifocal narrowing. ICA termini well perfused. A1 segments patent bilaterally. Normal anterior communicating artery complex. Anterior cerebral arteries patent to their distal aspects without stenosis. No M1 stenosis or occlusion. Normal MCA  bifurcations. Distal MCA branches well perfused and symmetric. Posterior circulation: Mild nonstenotic plaque noted within the V4 segments bilaterally. Both vertebral arteries patent to the vertebrobasilar junction without stenosis. Both picas patent. Basilar widely patent to its distal aspect without stenosis. Superior cerebral arteries patent bilaterally. Both PCAs primarily supplied via the basilar well perfused or distal aspects. Venous sinuses: Patent. Anatomic variants: None significant.  No intracranial aneurysm. Review of the MIP images confirms the above findings CT Brain Perfusion Findings: ASPECTS: 10 CBF (<30%) Volume: 35mL Perfusion (Tmax>6.0s) volume: 45mL Mismatch Volume: 79mL Infarction Location:Negative CT perfusion with no evidence for acute core infarct or other perfusion deficit. IMPRESSION: CTA HEAD AND NECK IMPRESSION: 1. Negative CTA for emergent large vessel occlusion. 2. 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 3. Additional moderate intracranial atheromatous change elsewhere about the major arterial vasculature of the head and neck. No other proximal high-grade or correctable stenosis. CT PERFUSION IMPRESSION: Negative CT perfusion. No evidence for acute core infarct or other perfusion deficit. Critical Value/emergent results were called by telephone at the time of interpretation on 06/10/2020 at 1:17 am to provider ERIC [facility] , who verbally acknowledged these results. Electronically Signed   By: Jeannine Boga M.D.   On: 06/10/2020 01:46   CT Code Stroke CTA Neck W/WO contrast  Result Date: 06/10/2020 CLINICAL DATA:  Initial evaluation for acute neuro deficit, stroke suspected, right-sided weakness, visual loss. EXAM: CT ANGIOGRAPHY HEAD AND NECK CT PERFUSION BRAIN TECHNIQUE: Multidetector CT imaging of the head and neck was performed using the standard protocol during bolus administration of intravenous contrast. Multiplanar CT image  reconstructions and MIPs were obtained to evaluate the vascular anatomy. Carotid stenosis measurements (when applicable) are obtained utilizing NASCET criteria, using the distal internal carotid diameter as the denominator. Multiphase CT imaging of the brain was performed following IV bolus contrast injection. Subsequent parametric perfusion maps were calculated using RAPID software. CONTRAST:  123mL OMNIPAQUE IOHEXOL 350 MG/ML SOLN COMPARISON:  Prior head CT from earlier the same day. FINDINGS: CTA NECK FINDINGS Aortic arch: Visualized aortic arch of normal caliber with normal 3 vessel morphology. Moderate atheromatous change about the arch and origin of the great vessels without hemodynamically significant stenosis. Right carotid system: Right CCA patent from its origin to the bifurcation without stenosis. Scattered calcified plaque about the right bifurcation/proximal right ICA without significant stenosis. Additional scattered eccentric calcified plaque within the distal right ICA just prior to the skull base with no more than mild stenosis of up to 35% by NASCET criteria. No dissection or other acute vascular abnormality. Left carotid system: Left CCA patent from its origin to the bifurcation without stenosis. Extensive bulky calcified plaque about the left bifurcation/proximal left ICA with associated stenosis of up to approximately 70% by NASCET criteria. Additional scattered eccentric calcified plaque within the distal cervical left ICA just prior to the skull base without hemodynamically significant stenosis. No dissection or other acute vascular abnormality. Vertebral arteries: Both vertebral arteries arise from the subclavian arteries. No hemodynamically significant proximal subclavian artery stenosis. Atheromatous change at the origin of the right vertebral artery with no more than mild stenosis. Proximal vertebral arteries not well assessed due to motion, but grossly patent without abnormality.  Visualized portions of the vertebral arteries patent without  dissection, stenosis, or occlusion. Skeleton: No acute osseous abnormality. No discrete or worrisome osseous lesions. Patient is edentulous. Other neck: No other acute soft tissue abnormality within the neck. No mass lesion or adenopathy. Upper chest: Visualized upper chest demonstrates no acute finding. Scattered atelectatic changes noted within the visualized lungs. Review of the MIP images confirms the above findings CTA HEAD FINDINGS Anterior circulation: Petrous segments patent bilaterally. Scattered atheromatous plaque throughout the cavernous/supraclinoid ICAs with associated mild to moderate multifocal narrowing. ICA termini well perfused. A1 segments patent bilaterally. Normal anterior communicating artery complex. Anterior cerebral arteries patent to their distal aspects without stenosis. No M1 stenosis or occlusion. Normal MCA bifurcations. Distal MCA branches well perfused and symmetric. Posterior circulation: Mild nonstenotic plaque noted within the V4 segments bilaterally. Both vertebral arteries patent to the vertebrobasilar junction without stenosis. Both picas patent. Basilar widely patent to its distal aspect without stenosis. Superior cerebral arteries patent bilaterally. Both PCAs primarily supplied via the basilar well perfused or distal aspects. Venous sinuses: Patent. Anatomic variants: None significant.  No intracranial aneurysm. Review of the MIP images confirms the above findings CT Brain Perfusion Findings: ASPECTS: 10 CBF (<30%) Volume: 51mL Perfusion (Tmax>6.0s) volume: 30mL Mismatch Volume: 1mL Infarction Location:Negative CT perfusion with no evidence for acute core infarct or other perfusion deficit. IMPRESSION: CTA HEAD AND NECK IMPRESSION: 1. Negative CTA for emergent large vessel occlusion. 2. 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 3. Additional moderate intracranial atheromatous  change elsewhere about the major arterial vasculature of the head and neck. No other proximal high-grade or correctable stenosis. CT PERFUSION IMPRESSION: Negative CT perfusion. No evidence for acute core infarct or other perfusion deficit. Critical Value/emergent results were called by telephone at the time of interpretation on 06/10/2020 at 1:17 am to provider ERIC [facility] , who verbally acknowledged these results. Electronically Signed   By: Jeannine Boga M.D.   On: 06/10/2020 01:46   CT Code Stroke Cerebral Perfusion with contrast  Result Date: 06/10/2020 CLINICAL DATA:  Initial evaluation for acute neuro deficit, stroke suspected, right-sided weakness, visual loss. EXAM: CT ANGIOGRAPHY HEAD AND NECK CT PERFUSION BRAIN TECHNIQUE: Multidetector CT imaging of the head and neck was performed using the standard protocol during bolus administration of intravenous contrast. Multiplanar CT image reconstructions and MIPs were obtained to evaluate the vascular anatomy. Carotid stenosis measurements (when applicable) are obtained utilizing NASCET criteria, using the distal internal carotid diameter as the denominator. Multiphase CT imaging of the brain was performed following IV bolus contrast injection. Subsequent parametric perfusion maps were calculated using RAPID software. CONTRAST:  170mL OMNIPAQUE IOHEXOL 350 MG/ML SOLN COMPARISON:  Prior head CT from earlier the same day. FINDINGS: CTA NECK FINDINGS Aortic arch: Visualized aortic arch of normal caliber with normal 3 vessel morphology. Moderate atheromatous change about the arch and origin of the great vessels without hemodynamically significant stenosis. Right carotid system: Right CCA patent from its origin to the bifurcation without stenosis. Scattered calcified plaque about the right bifurcation/proximal right ICA without significant stenosis. Additional scattered eccentric calcified plaque within the distal right ICA just prior to the skull base with  no more than mild stenosis of up to 35% by NASCET criteria. No dissection or other acute vascular abnormality. Left carotid system: Left CCA patent from its origin to the bifurcation without stenosis. Extensive bulky calcified plaque about the left bifurcation/proximal left ICA with associated stenosis of up to approximately 70% by NASCET criteria. Additional scattered eccentric calcified plaque within the distal cervical  left ICA just prior to the skull base without hemodynamically significant stenosis. No dissection or other acute vascular abnormality. Vertebral arteries: Both vertebral arteries arise from the subclavian arteries. No hemodynamically significant proximal subclavian artery stenosis. Atheromatous change at the origin of the right vertebral artery with no more than mild stenosis. Proximal vertebral arteries not well assessed due to motion, but grossly patent without abnormality. Visualized portions of the vertebral arteries patent without dissection, stenosis, or occlusion. Skeleton: No acute osseous abnormality. No discrete or worrisome osseous lesions. Patient is edentulous. Other neck: No other acute soft tissue abnormality within the neck. No mass lesion or adenopathy. Upper chest: Visualized upper chest demonstrates no acute finding. Scattered atelectatic changes noted within the visualized lungs. Review of the MIP images confirms the above findings CTA HEAD FINDINGS Anterior circulation: Petrous segments patent bilaterally. Scattered atheromatous plaque throughout the cavernous/supraclinoid ICAs with associated mild to moderate multifocal narrowing. ICA termini well perfused. A1 segments patent bilaterally. Normal anterior communicating artery complex. Anterior cerebral arteries patent to their distal aspects without stenosis. No M1 stenosis or occlusion. Normal MCA bifurcations. Distal MCA branches well perfused and symmetric. Posterior circulation: Mild nonstenotic plaque noted within the V4  segments bilaterally. Both vertebral arteries patent to the vertebrobasilar junction without stenosis. Both picas patent. Basilar widely patent to its distal aspect without stenosis. Superior cerebral arteries patent bilaterally. Both PCAs primarily supplied via the basilar well perfused or distal aspects. Venous sinuses: Patent. Anatomic variants: None significant.  No intracranial aneurysm. Review of the MIP images confirms the above findings CT Brain Perfusion Findings: ASPECTS: 10 CBF (<30%) Volume: 45mL Perfusion (Tmax>6.0s) volume: 10mL Mismatch Volume: 60mL Infarction Location:Negative CT perfusion with no evidence for acute core infarct or other perfusion deficit. IMPRESSION: CTA HEAD AND NECK IMPRESSION: 1. Negative CTA for emergent large vessel occlusion. 2. 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 3. Additional moderate intracranial atheromatous change elsewhere about the major arterial vasculature of the head and neck. No other proximal high-grade or correctable stenosis. CT PERFUSION IMPRESSION: Negative CT perfusion. No evidence for acute core infarct or other perfusion deficit. Critical Value/emergent results were called by telephone at the time of interpretation on 06/10/2020 at 1:17 am to provider [facility] , who verbally acknowledged these results. Electronically Signed   By: Jeannine Boga M.D.   On: 06/10/2020 01:46   CT HEAD CODE STROKE WO CONTRAST  Result Date: 06/10/2020 CLINICAL DATA:  Code stroke. Initial evaluation for acute right-sided weakness. EXAM: CT HEAD WITHOUT CONTRAST TECHNIQUE: Contiguous axial images were obtained from the base of the skull through the vertex without intravenous contrast. COMPARISON:  None available. FINDINGS: Brain: Age-related cerebral atrophy. No acute intracranial hemorrhage. No acute large vessel territory infarct. No mass lesion, midline shift or mass effect. No hydrocephalus or extra-axial fluid collection.  Vascular: No hyperdense vessel. Calcified atherosclerosis present at the skull base. Skull: Scalp soft tissues and calvarium within normal limits. Sinuses/Orbits: Mild left gaze noted. Globes and orbital soft tissues demonstrate no other acute finding. Mild scattered mucosal thickening noted within the ethmoidal air cells. Paranasal sinuses are otherwise clear. Small bilateral mastoid effusions noted, of doubtful significance. Other: None. ASPECTS [facility] Stroke Program Early CT Score) - Ganglionic level infarction (caudate, lentiform nuclei, internal capsule, insula, M1-M3 cortex): 7 - Supraganglionic infarction (M4-M6 cortex): 3 Total score (0-10 with 10 being normal): 10 IMPRESSION: 1. No acute intracranial infarct or other abnormality. 2. ASPECTS is 10. These results were communicated to Dr. Cheral Marker at 12:49  amon 9/25/2021by text page via the [facility] messaging system. Electronically Signed   By: Jeannine Boga M.D.   On: 06/10/2020 00:50    Procedures Procedures (including critical care time)  Medications Ordered in ED Medications  sodium chloride flush (NS) 0.9 % injection 3 mL (has no administration in time range)  iohexol (OMNIPAQUE) 350 MG/ML injection 100 mL (100 mLs Intravenous Contrast Given 06/10/20 0048)    ED Course  I have reviewed the triage vital signs and the nursing notes.  Pertinent labs & imaging results that were available during my care of the patient were reviewed by me and considered in my medical decision making (see chart for details).    MDM Rules/Calculators/A&P                          Patient presents to the emergency department for evaluation of acute altered mental status.  Patient has reportedly been complaining of intermittent headaches this past week.  He had another headache tonight around 5 PM, went and took a nap.  When he woke up at 35 his wife noticed that he was extremely confused.  This progressively worsened until at arrival in the ER patient was  staring, not speaking or following any commands.  He was brought in as a code stroke.  While in radiology receiving CT scans, patient started talking.  He is still confused and not at his baseline but is somewhat interactive.  CT head, CT angio head and neck, CT perfusion study do not show any cause for mental status change.  Patient was reportedly very hypertensive during transport but he is normotensive here in the ER.  Neuro exam continues to be nonfocal.  Neurology recommendation is for admission for MRI and further work-up.  Final Clinical Impression(s) / ED Diagnoses Final diagnoses:  Encephalopathy    Rx / DC Orders ED Discharge Orders    None       [place] Sigmon, Gwenyth Allegra, MD 06/10/20 0246

## 2020-06-10 NOTE — H&P (Addendum)
History and Physical  Alfred Carney TDV:[id number] DOB: [birth year] DOA: 06/10/2020  Referring physician: Dr. Betsey Holiday  PCP: Lesleigh Noe, MD  Outpatient Specialists: None Patient coming from: Home as a code stroke.  Chief Complaint: Code stroke   HPI: Alfred Carney is a 69 y.o. male with medical history significant for COPD, coronary artery disease, type 2 diabetes, GERD, fatty liver, hyperlipidemia, essential hypertension, morbid obesity, chronic hypoxia on 2L continuously, status post repair of thoracoabdominal aortic aneurysm, OSA who presented to the ED as a code stroke from home.  Last known well at 5 PM on 06/09/2020.  Around 5:30 PM he had a headache and took a nap.  Woke up around 11:30 PM and he could not see and his speech became rapidly less intelligible.  EMS was called, upon arrival the patient had difficulty speaking and could not follow commands, then he became completely nonverbal.  In the ED a stat CT head was done and did not reveal any acute intracranial abnormality.  Symptoms have mildly improved.  He is able to speak however he cannot recognize objects.  He is alert and oriented x 2. Can see some shapes but cannot make out what they are, could not see or tell me how many fingers I was holding in front of his eyes.  High suspicion for acute CVA or acute encephalopathy.  Admitted for further work-up.  ED Course:  Hypertensive with SBP >185. CTA HEAD AND NECK IMPRESSION:  1. Negative CTA for emergent large vessel occlusion. 2. 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 3. Additional moderate intracranial atheromatous change elsewhere about the major arterial vasculature of the head and neck. No other proximal high-grade or correctable stenosis.  CT PERFUSION IMPRESSION:  Negative CT perfusion. No evidence for acute core infarct or other perfusion deficit.  Review of Systems: Review of systems as noted in the HPI. All other systems  reviewed and are negative.   Past Medical History:  Diagnosis Date  . Chronic obstructive pulmonary disease ([place]) 2008   Moderate  . Colon polyps   . Community acquired pneumonia   . Coronary artery disease   . Degenerative joint disease   . Diabetes mellitus without complication ([place])    type 2  . Emphysema lung ([place])   . Enlarged liver    fatty liver by '09 CT  . Gastroesophageal reflux disease   . Gout   . H/O hiatal hernia   . History of [place] spotted fever    Possible history of [place] Spotted Fever  . Hyperlipidemia   . Hypertension   . Hypokalemia    diuretic induced, resolved  . Microcytic anemia    iron pills  . Morbid obesity ([place])   . Shortness of breath   . Skin cancer    skin cancer lip removed  . Sleep apnea    not currently using CPAP 05/20/13  . 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[place])    status post vascular surgery repair   Past Surgical History:  Procedure Laterality Date  . CARDIAC CATHETERIZATION  2007   Ejection fraction is estimated at 60%  . CHOLECYSTECTOMY    . COLONOSCOPY WITH PROPOFOL N/A 01/22/2018   Procedure: COLONOSCOPY WITH PROPOFOL;  Surgeon: Mauri Pole, MD;  Location: [facility];  Service: Endoscopy;  Laterality: N/A;  . DG NERVE ROOT BLOCK LUMBAR-SACRAL EACH ADD. LEVEL  10/23/2018      . ESOPHAGOGASTRODUODENOSCOPY (EGD) WITH PROPOFOL N/A 01/22/2018   Procedure: ESOPHAGOGASTRODUODENOSCOPY (EGD) WITH PROPOFOL;  Surgeon: Mauri Pole, MD;  Location: [facility];  Service: Endoscopy;  Laterality: N/A;  . HARDWARE REMOVAL Left 05/26/2013   Procedure: HARDWARE REMOVAL;  Surgeon: Newt Minion, MD;  Location: [facility];  Service: Orthopedics;  Laterality: Left;  Left Total Hip Arthroplasty, Removal of Deep Hardware  . HIP SURGERY     Status post left hip surgery with bone grafting  . IR INJECT/THERA/INC NEEDLE/CATH/PLC EPI/LUMB/SAC W/IMG  10/23/2018  . LEFT HEART CATHETERIZATION WITH CORONARY ANGIOGRAM N/A 01/31/2014    Procedure: LEFT HEART CATHETERIZATION WITH CORONARY ANGIOGRAM;  Surgeon: Burnell Blanks, MD;  Location: [facility] CATH LAB;  Service: Cardiovascular;  Laterality: N/A;  . LUMBAR LAMINECTOMY/DECOMPRESSION MICRODISCECTOMY Left 10/28/2018   Procedure: Left Lumbar three-four Extraforaminal microdiscectomy;  Surgeon: Jovita Gamma, MD;  Location: [place] [facility];  Service: Neurosurgery;  Laterality: Left;  . THORACOABDOMINAL AORTIC ANEURYSM REPAIR     with right femoral and left iliac BPG and reimplantation of renal arteries.  . TONSILLECTOMY    . TONSILLECTOMY    . TOTAL HIP ARTHROPLASTY Left 05/26/2013   Procedure: TOTAL HIP ARTHROPLASTY;  Surgeon: Newt Minion, MD;  Location: [facility];  Service: Orthopedics;  Laterality: Left;  Left Total Hip Arthroplasty, Removal Deep Hardware    Social History:  reports that he quit smoking about 13 years ago. His smoking use included cigarettes. He has a 35.00 pack-year smoking history. He has never used smokeless tobacco. He reports that he does not drink alcohol and does not use drugs.   No Known Allergies  Family History  Problem Relation Age of Onset  . Osteoarthritis Mother   . Diabetes Mother   . Pulmonary embolism Mother   . Heart disease Father        Coronary Artery Disease  . Stroke Father   . Multiple sclerosis Sister   . Factor V Leiden deficiency Sister   . Emphysema Sister   . Hyperlipidemia Brother   . Lung cancer Maternal Grandfather        smoked  . Heart attack Maternal Grandmother       Prior to Admission medications   Medication Sig Start Date End Date Taking? Authorizing Provider  acetaminophen (TYLENOL) 325 MG tablet Take 2 tablets (650 mg total) by mouth every 6 (six) hours. 10/30/18   Elgergawy, Silver Huguenin, MD  albuterol (PROVENTIL HFA;VENTOLIN HFA) 108 (90 Base) MCG/ACT inhaler Inhale 2 puffs into the lungs every 6 (six) hours as needed for wheezing or shortness of breath. 01/06/18   Tanda Rockers, MD  Calcium Carbonate-Simethicone  (ALKA-SELTZER HEARTBURN + GAS) 750-80 MG CHEW Chew 1 tablet by mouth as needed (gas).     [provider]  Cholecalciferol (VITAMIN D PO) Take 1 capsule by mouth daily.     [provider]  furosemide (LASIX) 20 MG tablet Take 1 tablet by mouth once daily 05/02/20   Lesleigh Noe, MD  glipiZIDE (GLUCOTROL) 5 MG tablet Take 1 tablet (5 mg total) by mouth 2 (two) times daily before a meal. 04/29/19   Lesleigh Noe, MD  irbesartan (AVAPRO) 300 MG tablet Take 1 tablet (300 mg total) by mouth daily. 12/06/19   Lesleigh Noe, MD  ketoconazole (NIZORAL) 2 % shampoo APPLY ONE APPLICATION TOPICALLY AS NEEDED FOR IRRITATION 02/03/20   Lesleigh Noe, MD  metoprolol succinate (TOPROL-XL) 25 MG 24 hr tablet Take 1 tablet by mouth once daily 11/15/19   Lesleigh Noe, MD  nitroGLYCERIN (NITROSTAT) 0.4 MG SL tablet Place 1  tablet (0.4 mg total) under the tongue every 5 (five) minutes x 3 doses as needed for chest pain. 01/04/19   Lesleigh Noe, MD  oxyCODONE (OXY IR/ROXICODONE) 5 MG immediate release tablet Take 1 tablet (5 mg total) by mouth every 6 (six) hours as needed for breakthrough pain. 11/04/18   Lesleigh Noe, MD  OXYGEN Inhale 2 L into the lungs continuous.     [provider]  pravastatin (PRAVACHOL) 20 MG tablet Take 1 tablet (20 mg total) by mouth daily. 10/21/19   Lesleigh Noe, MD  tamsulosin (FLOMAX) 0.4 MG CAPS capsule Take 1 capsule by mouth once daily at bedtime 01/18/20   Lesleigh Noe, MD    Physical Exam: BP 136/65   Pulse (!) 55   Temp 98.1 F (36.7 C) (Oral)   Resp 18   SpO2 97%   . General: 69 y.o. year-old male well developed well nourished in no acute distress.  Alert and oriented x2. . Cardiovascular: Regular rate and rhythm with no rubs or gallops.  No thyromegaly or JVD noted.  2+ pitting edema in lower extremity bilaterally.  Marland Kitchen Respiratory: Clear to auscultation with no wheezes or rales. Good inspiratory effort. . Abdomen: Soft nontender  nondistended with normal bowel sounds x4 quadrants. . Muskuloskeletal: No cyanosis, clubbing or edema noted bilaterally . Neuro: CN II-XII intact, strength, sensation, reflexes . Skin: No ulcerative lesions noted or rashes . Psychiatry: Judgement and insight appear altered. Mood is appropriate for condition and setting.          Labs on Admission:  Basic Metabolic Panel: Recent Labs  Lab 06/10/20 0048 06/10/20 0049  NA 143 145  K 4.0 3.7  CL 106 104  CO2 27  --   GLUCOSE 182* 168*  BUN 14 15  CREATININE 1.18 1.10  CALCIUM 8.8*  --    Liver Function Tests: Recent Labs  Lab 06/10/20 0048  AST 22  ALT 38  ALKPHOS 63  BILITOT 0.7  PROT 6.2*  ALBUMIN 3.6   No results for input(s): LIPASE, AMYLASE in the last 168 hours. No results for input(s): AMMONIA in the last 168 hours. CBC: Recent Labs  Lab 06/10/20 0048 06/10/20 0049  WBC 7.0  --   NEUTROABS 4.2  --   HGB 14.6 14.3  HCT 45.1 42.0  MCV 95.1  --   PLT 164  --    Cardiac Enzymes: No results for input(s): CKTOTAL, CKMB, CKMBINDEX, TROPONINI in the last 168 hours.  BNP (last 3 results) No results for input(s): BNP in the last 8760 hours.  ProBNP (last 3 results) No results for input(s): PROBNP in the last 8760 hours.  CBG: No results for input(s): GLUCAP in the last 168 hours.  Radiological Exams on Admission: CT Code Stroke CTA Head W/WO contrast  Result Date: 06/10/2020 CLINICAL DATA:  Initial evaluation for acute neuro deficit, stroke suspected, right-sided weakness, visual loss. EXAM: CT ANGIOGRAPHY HEAD AND NECK CT PERFUSION BRAIN TECHNIQUE: Multidetector CT imaging of the head and neck was performed using the standard protocol during bolus administration of intravenous contrast. Multiplanar CT image reconstructions and MIPs were obtained to evaluate the vascular anatomy. Carotid stenosis measurements (when applicable) are obtained utilizing NASCET criteria, using the distal internal carotid diameter as  the denominator. Multiphase CT imaging of the brain was performed following IV bolus contrast injection. Subsequent parametric perfusion maps were calculated using RAPID software. CONTRAST:  122mL OMNIPAQUE IOHEXOL 350 MG/ML SOLN COMPARISON:  Prior head CT  from earlier the same day. FINDINGS: CTA NECK FINDINGS Aortic arch: Visualized aortic arch of normal caliber with normal 3 vessel morphology. Moderate atheromatous change about the arch and origin of the great vessels without hemodynamically significant stenosis. Right carotid system: Right CCA patent from its origin to the bifurcation without stenosis. Scattered calcified plaque about the right bifurcation/proximal right ICA without significant stenosis. Additional scattered eccentric calcified plaque within the distal right ICA just prior to the skull base with no more than mild stenosis of up to 35% by NASCET criteria. No dissection or other acute vascular abnormality. Left carotid system: Left CCA patent from its origin to the bifurcation without stenosis. Extensive bulky calcified plaque about the left bifurcation/proximal left ICA with associated stenosis of up to approximately 70% by NASCET criteria. Additional scattered eccentric calcified plaque within the distal cervical left ICA just prior to the skull base without hemodynamically significant stenosis. No dissection or other acute vascular abnormality. Vertebral arteries: Both vertebral arteries arise from the subclavian arteries. No hemodynamically significant proximal subclavian artery stenosis. Atheromatous change at the origin of the right vertebral artery with no more than mild stenosis. Proximal vertebral arteries not well assessed due to motion, but grossly patent without abnormality. Visualized portions of the vertebral arteries patent without dissection, stenosis, or occlusion. Skeleton: No acute osseous abnormality. No discrete or worrisome osseous lesions. Patient is edentulous. Other neck:  No other acute soft tissue abnormality within the neck. No mass lesion or adenopathy. Upper chest: Visualized upper chest demonstrates no acute finding. Scattered atelectatic changes noted within the visualized lungs. Review of the MIP images confirms the above findings CTA HEAD FINDINGS Anterior circulation: Petrous segments patent bilaterally. Scattered atheromatous plaque throughout the cavernous/supraclinoid ICAs with associated mild to moderate multifocal narrowing. ICA termini well perfused. A1 segments patent bilaterally. Normal anterior communicating artery complex. Anterior cerebral arteries patent to their distal aspects without stenosis. No M1 stenosis or occlusion. Normal MCA bifurcations. Distal MCA branches well perfused and symmetric. Posterior circulation: Mild nonstenotic plaque noted within the V4 segments bilaterally. Both vertebral arteries patent to the vertebrobasilar junction without stenosis. Both picas patent. Basilar widely patent to its distal aspect without stenosis. Superior cerebral arteries patent bilaterally. Both PCAs primarily supplied via the basilar well perfused or distal aspects. Venous sinuses: Patent. Anatomic variants: None significant.  No intracranial aneurysm. Review of the MIP images confirms the above findings CT Brain Perfusion Findings: ASPECTS: 10 CBF (<30%) Volume: 78mL Perfusion (Tmax>6.0s) volume: 58mL Mismatch Volume: 35mL Infarction Location:Negative CT perfusion with no evidence for acute core infarct or other perfusion deficit. IMPRESSION: CTA HEAD AND NECK IMPRESSION: 1. Negative CTA for emergent large vessel occlusion. 2. 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 3. Additional moderate intracranial atheromatous change elsewhere about the major arterial vasculature of the head and neck. No other proximal high-grade or correctable stenosis. CT PERFUSION IMPRESSION: Negative CT perfusion. No evidence for acute core infarct or  other perfusion deficit. Critical Value/emergent results were called by telephone at the time of interpretation on 06/10/2020 at 1:17 am to provider ERIC [facility] , who verbally acknowledged these results. Electronically Signed   By: Jeannine Boga M.D.   On: 06/10/2020 01:46   CT Code Stroke CTA Neck W/WO contrast  Result Date: 06/10/2020 CLINICAL DATA:  Initial evaluation for acute neuro deficit, stroke suspected, right-sided weakness, visual loss. EXAM: CT ANGIOGRAPHY HEAD AND NECK CT PERFUSION BRAIN TECHNIQUE: Multidetector CT imaging of the head and neck was performed using the standard  protocol during bolus administration of intravenous contrast. Multiplanar CT image reconstructions and MIPs were obtained to evaluate the vascular anatomy. Carotid stenosis measurements (when applicable) are obtained utilizing NASCET criteria, using the distal internal carotid diameter as the denominator. Multiphase CT imaging of the brain was performed following IV bolus contrast injection. Subsequent parametric perfusion maps were calculated using RAPID software. CONTRAST:  173mL OMNIPAQUE IOHEXOL 350 MG/ML SOLN COMPARISON:  Prior head CT from earlier the same day. FINDINGS: CTA NECK FINDINGS Aortic arch: Visualized aortic arch of normal caliber with normal 3 vessel morphology. Moderate atheromatous change about the arch and origin of the great vessels without hemodynamically significant stenosis. Right carotid system: Right CCA patent from its origin to the bifurcation without stenosis. Scattered calcified plaque about the right bifurcation/proximal right ICA without significant stenosis. Additional scattered eccentric calcified plaque within the distal right ICA just prior to the skull base with no more than mild stenosis of up to 35% by NASCET criteria. No dissection or other acute vascular abnormality. Left carotid system: Left CCA patent from its origin to the bifurcation without stenosis. Extensive bulky  calcified plaque about the left bifurcation/proximal left ICA with associated stenosis of up to approximately 70% by NASCET criteria. Additional scattered eccentric calcified plaque within the distal cervical left ICA just prior to the skull base without hemodynamically significant stenosis. No dissection or other acute vascular abnormality. Vertebral arteries: Both vertebral arteries arise from the subclavian arteries. No hemodynamically significant proximal subclavian artery stenosis. Atheromatous change at the origin of the right vertebral artery with no more than mild stenosis. Proximal vertebral arteries not well assessed due to motion, but grossly patent without abnormality. Visualized portions of the vertebral arteries patent without dissection, stenosis, or occlusion. Skeleton: No acute osseous abnormality. No discrete or worrisome osseous lesions. Patient is edentulous. Other neck: No other acute soft tissue abnormality within the neck. No mass lesion or adenopathy. Upper chest: Visualized upper chest demonstrates no acute finding. Scattered atelectatic changes noted within the visualized lungs. Review of the MIP images confirms the above findings CTA HEAD FINDINGS Anterior circulation: Petrous segments patent bilaterally. Scattered atheromatous plaque throughout the cavernous/supraclinoid ICAs with associated mild to moderate multifocal narrowing. ICA termini well perfused. A1 segments patent bilaterally. Normal anterior communicating artery complex. Anterior cerebral arteries patent to their distal aspects without stenosis. No M1 stenosis or occlusion. Normal MCA bifurcations. Distal MCA branches well perfused and symmetric. Posterior circulation: Mild nonstenotic plaque noted within the V4 segments bilaterally. Both vertebral arteries patent to the vertebrobasilar junction without stenosis. Both picas patent. Basilar widely patent to its distal aspect without stenosis. Superior cerebral arteries patent  bilaterally. Both PCAs primarily supplied via the basilar well perfused or distal aspects. Venous sinuses: Patent. Anatomic variants: None significant.  No intracranial aneurysm. Review of the MIP images confirms the above findings CT Brain Perfusion Findings: ASPECTS: 10 CBF (<30%) Volume: 83mL Perfusion (Tmax>6.0s) volume: 36mL Mismatch Volume: 75mL Infarction Location:Negative CT perfusion with no evidence for acute core infarct or other perfusion deficit. IMPRESSION: CTA HEAD AND NECK IMPRESSION: 1. Negative CTA for emergent large vessel occlusion. 2. 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 3. Additional moderate intracranial atheromatous change elsewhere about the major arterial vasculature of the head and neck. No other proximal high-grade or correctable stenosis. CT PERFUSION IMPRESSION: Negative CT perfusion. No evidence for acute core infarct or other perfusion deficit. Critical Value/emergent results were called by telephone at the time of interpretation on 06/10/2020 at 1:17 am to  provider ERIC LINDZEN , who verbally acknowledged these results. Electronically Signed   By: Jeannine Boga M.D.   On: 06/10/2020 01:46   CT Code Stroke Cerebral Perfusion with contrast  Result Date: 06/10/2020 CLINICAL DATA:  Initial evaluation for acute neuro deficit, stroke suspected, right-sided weakness, visual loss. EXAM: CT ANGIOGRAPHY HEAD AND NECK CT PERFUSION BRAIN TECHNIQUE: Multidetector CT imaging of the head and neck was performed using the standard protocol during bolus administration of intravenous contrast. Multiplanar CT image reconstructions and MIPs were obtained to evaluate the vascular anatomy. Carotid stenosis measurements (when applicable) are obtained utilizing NASCET criteria, using the distal internal carotid diameter as the denominator. Multiphase CT imaging of the brain was performed following IV bolus contrast injection. Subsequent parametric perfusion  maps were calculated using RAPID software. CONTRAST:  134mL OMNIPAQUE IOHEXOL 350 MG/ML SOLN COMPARISON:  Prior head CT from earlier the same day. FINDINGS: CTA NECK FINDINGS Aortic arch: Visualized aortic arch of normal caliber with normal 3 vessel morphology. Moderate atheromatous change about the arch and origin of the great vessels without hemodynamically significant stenosis. Right carotid system: Right CCA patent from its origin to the bifurcation without stenosis. Scattered calcified plaque about the right bifurcation/proximal right ICA without significant stenosis. Additional scattered eccentric calcified plaque within the distal right ICA just prior to the skull base with no more than mild stenosis of up to 35% by NASCET criteria. No dissection or other acute vascular abnormality. Left carotid system: Left CCA patent from its origin to the bifurcation without stenosis. Extensive bulky calcified plaque about the left bifurcation/proximal left ICA with associated stenosis of up to approximately 70% by NASCET criteria. Additional scattered eccentric calcified plaque within the distal cervical left ICA just prior to the skull base without hemodynamically significant stenosis. No dissection or other acute vascular abnormality. Vertebral arteries: Both vertebral arteries arise from the subclavian arteries. No hemodynamically significant proximal subclavian artery stenosis. Atheromatous change at the origin of the right vertebral artery with no more than mild stenosis. Proximal vertebral arteries not well assessed due to motion, but grossly patent without abnormality. Visualized portions of the vertebral arteries patent without dissection, stenosis, or occlusion. Skeleton: No acute osseous abnormality. No discrete or worrisome osseous lesions. Patient is edentulous. Other neck: No other acute soft tissue abnormality within the neck. No mass lesion or adenopathy. Upper chest: Visualized upper chest demonstrates no  acute finding. Scattered atelectatic changes noted within the visualized lungs. Review of the MIP images confirms the above findings CTA HEAD FINDINGS Anterior circulation: Petrous segments patent bilaterally. Scattered atheromatous plaque throughout the cavernous/supraclinoid ICAs with associated mild to moderate multifocal narrowing. ICA termini well perfused. A1 segments patent bilaterally. Normal anterior communicating artery complex. Anterior cerebral arteries patent to their distal aspects without stenosis. No M1 stenosis or occlusion. Normal MCA bifurcations. Distal MCA branches well perfused and symmetric. Posterior circulation: Mild nonstenotic plaque noted within the V4 segments bilaterally. Both vertebral arteries patent to the vertebrobasilar junction without stenosis. Both picas patent. Basilar widely patent to its distal aspect without stenosis. Superior cerebral arteries patent bilaterally. Both PCAs primarily supplied via the basilar well perfused or distal aspects. Venous sinuses: Patent. Anatomic variants: None significant.  No intracranial aneurysm. Review of the MIP images confirms the above findings CT Brain Perfusion Findings: ASPECTS: 10 CBF (<30%) Volume: 36mL Perfusion (Tmax>6.0s) volume: 2mL Mismatch Volume: 54mL Infarction Location:Negative CT perfusion with no evidence for acute core infarct or other perfusion deficit. IMPRESSION: CTA HEAD AND NECK IMPRESSION: 1. Negative CTA  for emergent large vessel occlusion. 2. 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 3. Additional moderate intracranial atheromatous change elsewhere about the major arterial vasculature of the head and neck. No other proximal high-grade or correctable stenosis. CT PERFUSION IMPRESSION: Negative CT perfusion. No evidence for acute core infarct or other perfusion deficit. Critical Value/emergent results were called by telephone at the time of interpretation on 06/10/2020 at 1:17 am to  provider ERIC [facility] , who verbally acknowledged these results. Electronically Signed   By: Jeannine Boga M.D.   On: 06/10/2020 01:46   CT HEAD CODE STROKE WO CONTRAST  Result Date: 06/10/2020 CLINICAL DATA:  Code stroke. Initial evaluation for acute right-sided weakness. EXAM: CT HEAD WITHOUT CONTRAST TECHNIQUE: Contiguous axial images were obtained from the base of the skull through the vertex without intravenous contrast. COMPARISON:  None available. FINDINGS: Brain: Age-related cerebral atrophy. No acute intracranial hemorrhage. No acute large vessel territory infarct. No mass lesion, midline shift or mass effect. No hydrocephalus or extra-axial fluid collection. Vascular: No hyperdense vessel. Calcified atherosclerosis present at the skull base. Skull: Scalp soft tissues and calvarium within normal limits. Sinuses/Orbits: Mild left gaze noted. Globes and orbital soft tissues demonstrate no other acute finding. Mild scattered mucosal thickening noted within the ethmoidal air cells. Paranasal sinuses are otherwise clear. Small bilateral mastoid effusions noted, of doubtful significance. Other: None. ASPECTS [facility] Stroke Program Early CT Score) - Ganglionic level infarction (caudate, lentiform nuclei, internal capsule, insula, M1-M3 cortex): 7 - Supraganglionic infarction (M4-M6 cortex): 3 Total score (0-10 with 10 being normal): 10 IMPRESSION: 1. No acute intracranial infarct or other abnormality. 2. ASPECTS is 10. These results were communicated to Dr. Cheral Marker at 12:49 amon 9/25/2021by text page via the [facility] messaging system. Electronically Signed   By: Jeannine Boga M.D.   On: 06/10/2020 00:50    EKG: I independently viewed the EKG done and my findings are as followed: Sinus rhythm rate of 77.  Nonspecific ST-T changes.  Assessment/Plan Present on Admission: . Acute metabolic encephalopathy  Active Problems:   Acute metabolic encephalopathy  Acute metabolic encephalopathy of  unclear etiology/or possible brainstem stroke, per Neurology Presented from home as a code stroke, acute visual loss and mutism Unclear if this is brainstem stroke, or acute encephalopathy Admitted for stroke work-up Obtain hemoglobin A1c, lipid panel MRI, MRA of the brain without contrast PT/OT/speech consult 2D echo, carotid Doppler ultrasound Frequent neurochecks Telemetry monitoring NPO until evaluated by speech therapy Fall/aspiration precautions  Type 2 diabetes with hyperglycemia Obtain Hemoglobin A1c Hold home oral hypoglycemics Start insulin sliding scale  Essential hypertension  Allowing for permissive hypertension until acute CVA is ruled out with an MRI Treat BP when SBP greater than 220 and DBP greater than 120. IV hydralazine as needed with parameters. Continue to monitor vital signs  Morbid obesity Weight 149 kg Recommend weight loss outpatient with regular physical activity and healthy dieting.  BPH Resume home tamsulosin Monitor urine output   DVT prophylaxis: SQ Lovenox daily.   Code Status: Full code, per the patient and his son at bedside in the ED.  Family Communication: Updated his son at bedside.   Disposition Plan:  Admit to telemetry medical  Consults called: Neurology consulted by EDP  Admission status:  Observation status.    Status is: Observation    Dispo:  Patient From: Home  Planned Disposition: Home  Expected discharge date: 06/11/20  Medically stable for discharge: No, ongoing work up to rule out acute encephalopathy  versus acute CVA.   Kayleen Memos MD [facility] Pager [phone]  If 7PM-7AM, please contact night-coverage [email or web address] Password [facility]  06/10/2020, 3:04 AM

## 2020-06-10 NOTE — Progress Notes (Signed)
Echocardiogram 2D Echocardiogram has been performed.  Oneal Deputy Gryffin Altice 06/10/2020, 9:36 AM

## 2020-06-10 NOTE — Progress Notes (Signed)
VASCULAR LAB    Carotid duplex has been performed.  See CV proc for preliminary results.   Alfred Carney, RVT 06/10/2020, 9:34 AM

## 2020-06-10 NOTE — Progress Notes (Signed)
LTM EEG hooked up and running - no initial skin breakdown - push button tested - neuro notified.  

## 2020-06-10 NOTE — Progress Notes (Signed)
Attempted report to 3w-31

## 2020-06-10 NOTE — Progress Notes (Signed)
Patient ID: Alfred Carney, male   DOB: [birth year], 69 y.o.   MRN: [id number] Patient admitted early this morning as a code stroke due to acute visual loss and mutism.  Neurology has been consulted.  CT of the head was negative for any acute abnormality.  Patient seen and examined at bedside.  Slightly confused, speech slightly improving.  Plan of care discussed with the patient and son at bedside.  I have reviewed patient's medical records including this morning's H&P, vitals, labs, medications myself.  Follow MRI of the brain.  Follow further recommendations from neurology.  PT/OT/SLP evaluation.

## 2020-06-10 NOTE — Procedures (Addendum)
Patient Name: Alfred Carney  MRN: [id number]  Epilepsy Attending: Lora Havens  Referring Physician/Provider: Dr Kerney Elbe Date: 06/10/2020 Duration: 23.38 mins  Patient history: 69 y.o. male presenting with acute onset of visual loss and mutism. EEG to evaluate for seizure.  Level of alertness:  Awake, asleep  AEDs during EEG study: None  Technical aspects: This EEG study was done with scalp electrodes positioned according to the 10-20 International system of electrode placement. Electrical activity was acquired at a sampling rate of 500Hz  and reviewed with a high frequency filter of 70Hz  and a low frequency filter of 1Hz . EEG data were recorded continuously and digitally stored.   Description: No posterior dominant rhythm was seen. Sleep was characterized by vertex waves, sleep spindles (12-14hz ), maximal frontocentral region. EEG showed continuous generalized 3 to 6 Hz theta-delta slowing. Physiologic photic driving was not seen during photic stimulation.  Hyperventilation was not performed.    EKG artifact was also noted.    ABNORMALITY -Continuous slow, generalized  IMPRESSION: This study is suggestive of moderate diffuse encephalopathy, nonspecific etiology. No seizures or epileptiform discharges were seen throughout the recording.  Alfred Carney Barbra Sarks

## 2020-06-10 NOTE — Progress Notes (Signed)
Patient transported to 3w-31, report will be given at bedside.

## 2020-06-10 NOTE — Evaluation (Signed)
Occupational Therapy Evaluation Patient Details Name: Alfred Carney MRN: [id number] DOB: 1950-12-13 Today's Date: 06/10/2020    History of Present Illness The pt is a 69 yo male presenting with AMS, headaches, weakness, and worsening command following. Code stroke was activated upon arrival, MRI showed "signalabnormality involving the bilateral parietal and occipital regions,left slightly worse than right, most consistent with PRES." PMH includes: COPD, CAD, DM II, GERD, HLD, HTN, and morbid obesity.   Clinical Impression   At home, Pt is mod I for ambulation (needed DME PRN) he gets assist for LB dressing from his wife at baseline. Pt today presents with confusion, visual deficits, balance deficits and overall decreased independence in ADL and functional transfers. Today he was able to stand from the edge of ED "bed" but stabilized his legs from behind with bed - unable to walk away from bed today. Please see vision section below - but he does not blink to threat and could not track or see items to track during assessment today. At this time, recommending CIR to maximize safety and independence in ADL and functional transfers. OT will follow acutely with vision strategies as well as cognition and ADL performance.     Follow Up Recommendations  CIR    Equipment Recommendations  Other (comment) (O2 via [place])    Recommendations for Other Services       Precautions / Restrictions Precautions Precautions: Fall Precaution Comments: watch BP Restrictions Weight Bearing Restrictions: No      Mobility Bed Mobility Overal bed mobility: Needs Assistance Bed Mobility: Supine to Sit;Sit to Supine     Supine to sit: Mod assist;+2 for safety/equipment Sit to supine: Mod assist   General bed mobility comments: modA without use of bed rail on ED stretcher, assist to BLE to pivot to sitting EOB, modA to maintain trunk stability on uneven surface. modA to transition back to bed, pt able to pull  himself up using rails  Transfers Overall transfer level: Needs assistance Equipment used: 2 person hand held assist Transfers: Sit to/from Stand Sit to Stand: Min assist;+2 safety/equipment;From elevated surface         General transfer comment: could likely progress to minA of 1, HHA more to steady than power up, slight posterior lean, bracing on bed with static stance    Balance Overall balance assessment: Needs assistance Sitting-balance support: Bilateral upper extremity supported;Feet unsupported Sitting balance-Leahy Scale: Poor Sitting balance - Comments: minA to minG sitting EOB, BUE support Postural control: Posterior lean (in static standing, multiple minor LOB backwards in standing) Standing balance support: Bilateral upper extremity supported;During functional activity Standing balance-Leahy Scale: Poor Standing balance comment: minA through HHA of 2 to maintain standing                           ADL either performed or assessed with clinical judgement   ADL Overall ADL's : Needs assistance/impaired Eating/Feeding: NPO (currently undergoing swallow eval)   Grooming: Set up;Sitting;Wash/dry face Grooming Details (indicate cue type and reason): supine, HOB elevated Upper Body Bathing: Moderate assistance   Lower Body Bathing: Maximal assistance   Upper Body Dressing : Moderate assistance   Lower Body Dressing: Maximal assistance   Toilet Transfer: Minimal assistance;+2 for safety/equipment   Toileting- Clothing Manipulation and Hygiene: Maximal assistance;+2 for safety/equipment;Sit to/from stand Toileting - Clothing Manipulation Details (indicate cue type and reason): Pt unable to perform peri care and maintain standing balance this session     Functional mobility  during ADLs: Minimal assistance;+2 for physical assistance;+2 for safety/equipment;Cueing for safety (standing with back of legs propped on bed posterior lean) General ADL Comments:  confusion, decreased vision impacting ADL, balance deficits     Vision Baseline Vision/History: Cataracts (was scheduled to have cataract sx) Patient Visual Report: Blurring of vision Vision Assessment?: Yes;Vision impaired- to be further tested in functional context Eye Alignment: Within Functional Limits Ocular Range of Motion: Within Functional Limits Alignment/Gaze Preference: Within Defined Limits Tracking/Visual Pursuits: Impaired - to be further tested in functional context Additional Comments: Pt does not blink to threat, he cannot see gloves/hands to perform tracking     Perception     Praxis      Pertinent Vitals/Pain Pain Assessment: 0-10 Pain Score: 6  Pain Location: back with bed mobility (back sx in Feb 2020) Pain Descriptors / Indicators: Sore;Grimacing Pain Intervention(s): Limited activity within patient's tolerance;Monitored during session;Repositioned     Hand Dominance Right   Extremity/Trunk Assessment Upper Extremity Assessment Upper Extremity Assessment: Overall WFL for tasks assessed   Lower Extremity Assessment Lower Extremity Assessment: Defer to PT evaluation   Cervical / Trunk Assessment Cervical / Trunk Assessment: Other exceptions (obesity, hx of back sx)   Communication Communication Communication: HOH (pt reports both ears equally impaired, was going to get cata)   Cognition Arousal/Alertness: Awake/alert Behavior During Therapy: WFL for tasks assessed/performed Overall Cognitive Status: Impaired/Different from baseline Area of Impairment: Orientation;Memory;Following commands;Problem solving;Awareness                 Orientation Level: Disoriented to;Situation   Memory: Decreased short-term memory Following Commands: Follows one step commands consistently   Awareness: Emergent Problem Solving: Requires verbal cues;Difficulty sequencing General Comments: pt awareness and orientation waxing and waning per son who was present  for eval. Pt with some inconsistent answers during history taking (answering "yes" to "is your shower a tub shower or a walk-in shower?") but able to describe other sx such as vision deficits well with good awareness.   General Comments  son present throughout session and supportive    Exercises     Shoulder Instructions      Home Living Family/patient expects to be discharged to:: Private residence Living Arrangements: Spouse/significant other Available Help at Discharge: Family;Available PRN/intermittently (adult children live locally, wife with recent abdominal sx) Type of Home: House Home Access: Stairs to enter [employer] of Steps: 3 Entrance Stairs-Rails: Can reach both Home Layout: One level     Bathroom Shower/Tub: Occupational psychologist: Handicapped height Bathroom Accessibility: Yes   Home Equipment: Environmental consultant - 2 wheels;Walker - 4 wheels;Cane - single point;Shower seat;Grab bars - tub/shower          Prior Functioning/Environment Level of Independence: Needs assistance  Gait / Transfers Assistance Needed: mostly independent without need for AD, RW as needed ADL's / Homemaking Assistance Needed: wife assist with sock donning   Comments: Pt has AE for LB ADL from previous back sx in Feb        OT Problem List: Decreased activity tolerance;Impaired balance (sitting and/or standing);Impaired vision/perception;Decreased cognition;Decreased safety awareness;Decreased knowledge of use of DME or AE;Obesity      OT Treatment/Interventions: Self-care/ADL training;Energy conservation;DME and/or AE instruction;Therapeutic activities;Cognitive remediation/compensation;Patient/family education;Visual/perceptual remediation/compensation;Balance training    OT Goals(Current goals can be found in the care plan section) Acute Rehab OT Goals Patient Stated Goal: determine cause of vision change OT Goal Formulation: With patient/family Time For Goal  Achievement: 06/24/20 Potential to Achieve Goals: Good ADL  Goals Pt Will Perform Eating: with supervision;with caregiver independent in assisting Pt Will Perform Upper Body Dressing: with set-up;sitting Pt Will Perform Lower Body Dressing: with min assist;sit to/from stand Pt Will Transfer to Toilet: with modified independence;ambulating Pt Will Perform Toileting - Clothing Manipulation and hygiene: with supervision;sit to/from stand Additional ADL Goal #1: Pt will perform bed mobility at mod I level prior to engaging in ADL  OT Frequency: Min 2X/week   Barriers to D/C:            Co-evaluation PT/OT/SLP Co-Evaluation/Treatment: Yes Reason for Co-Treatment: Complexity of the patient's impairments (multi-system involvement);For patient/therapist safety;To address functional/ADL transfers PT goals addressed during session: Mobility/safety with mobility OT goals addressed during session: ADL's and self-care;Strengthening/ROM;Other (comment) (vision, cognition)      AM-PAC OT "6 Clicks" Daily Activity     Outcome Measure Help from another person eating meals?: Total (NPO) Help from another person taking care of personal grooming?: A Little Help from another person toileting, which includes using toliet, bedpan, or urinal?: A Lot Help from another person bathing (including washing, rinsing, drying)?: A Lot Help from another person to put on and taking off regular upper body clothing?: A Lot Help from another person to put on and taking off regular lower body clothing?: A Lot 6 Click Score: 12   End of Session Equipment Utilized During Treatment: Gait belt;Oxygen (3L (on 2L at home - typical to desaturate with activity) Nurse Communication: Mobility status  Activity Tolerance: Patient tolerated treatment well Patient left: in bed;with call bell/phone within reach;with family/visitor present  OT Visit Diagnosis: Unsteadiness on feet (R26.81);Other abnormalities of gait and mobility  (R26.89);Other symptoms and signs involving cognitive function;Low vision, both eyes (H54.2)                Time: 7121-9758 OT Time Calculation (min): 33 min Charges:  OT General Charges $OT Visit: 1 Visit OT Evaluation $OT Eval Moderate Complexity: [place] OTR/L Acute Rehabilitation Services Pager: [phone] Office: [place] 06/10/2020, 5:22 PM

## 2020-06-10 NOTE — Code Documentation (Signed)
Stroke Response Nurse Documentation Code Documentation  Alfred Carney is a 69 y.o. male arriving to [place] ED via [facility] on 9/25 with past medical hx of AAA repair, HTN, CAD. Code stroke was activated by EMS. Patient from Home where he was LKW at 1700 and now complaining of Headache, aphasia . On No antithrombotic. Stroke team at the bedside on patient arrival. Labs drawn and patient cleared for CT by Dr. Waverly Ferrari. Patient to CT with team. NIHSS 18, see documentation for details and code stroke times. Patient with decreased LOC, not following commands, bilateral hemianopia, bilateral arm weakness, Global aphasia  and dysarthria  on exam. The following imaging was completed:  CT/CTA/CTP. Patient is not a candidate for tPA due to Out of window. While on the CT table, pt woke up and was able to answer some questions and state he needed to use the bathroom. Bedside handoff with ED RN Flonnie Hailstone.     Madelynn Done  Rapid Response RN

## 2020-06-11 DIAGNOSIS — G9340 Encephalopathy, unspecified: Secondary | ICD-10-CM | POA: Diagnosis present

## 2020-06-11 DIAGNOSIS — N400 Enlarged prostate without lower urinary tract symptoms: Secondary | ICD-10-CM | POA: Diagnosis present

## 2020-06-11 DIAGNOSIS — Z86010 Personal history of colonic polyps: Secondary | ICD-10-CM | POA: Diagnosis not present

## 2020-06-11 DIAGNOSIS — R4781 Slurred speech: Secondary | ICD-10-CM | POA: Diagnosis not present

## 2020-06-11 DIAGNOSIS — J449 Chronic obstructive pulmonary disease, unspecified: Secondary | ICD-10-CM | POA: Diagnosis not present

## 2020-06-11 DIAGNOSIS — Z85828 Personal history of other malignant neoplasm of skin: Secondary | ICD-10-CM | POA: Diagnosis not present

## 2020-06-11 DIAGNOSIS — H53139 Sudden visual loss, unspecified eye: Secondary | ICD-10-CM | POA: Diagnosis not present

## 2020-06-11 DIAGNOSIS — Z20822 Contact with and (suspected) exposure to covid-19: Secondary | ICD-10-CM | POA: Diagnosis not present

## 2020-06-11 DIAGNOSIS — I2510 Atherosclerotic heart disease of native coronary artery without angina pectoris: Secondary | ICD-10-CM | POA: Diagnosis not present

## 2020-06-11 DIAGNOSIS — E1165 Type 2 diabetes mellitus with hyperglycemia: Secondary | ICD-10-CM | POA: Diagnosis present

## 2020-06-11 DIAGNOSIS — Z833 Family history of diabetes mellitus: Secondary | ICD-10-CM | POA: Diagnosis not present

## 2020-06-11 DIAGNOSIS — Z8249 Family history of ischemic heart disease and other diseases of the circulatory system: Secondary | ICD-10-CM | POA: Diagnosis not present

## 2020-06-11 DIAGNOSIS — I6783 Posterior reversible encephalopathy syndrome: Secondary | ICD-10-CM

## 2020-06-11 DIAGNOSIS — H547 Unspecified visual loss: Secondary | ICD-10-CM | POA: Diagnosis not present

## 2020-06-11 DIAGNOSIS — R4701 Aphasia: Secondary | ICD-10-CM | POA: Diagnosis not present

## 2020-06-11 DIAGNOSIS — G9341 Metabolic encephalopathy: Secondary | ICD-10-CM | POA: Diagnosis not present

## 2020-06-11 DIAGNOSIS — Z6841 Body Mass Index (BMI) 40.0 and over, adult: Secondary | ICD-10-CM | POA: Diagnosis not present

## 2020-06-11 DIAGNOSIS — Z96642 Presence of left artificial hip joint: Secondary | ICD-10-CM | POA: Diagnosis present

## 2020-06-11 DIAGNOSIS — E785 Hyperlipidemia, unspecified: Secondary | ICD-10-CM | POA: Diagnosis present

## 2020-06-11 DIAGNOSIS — K219 Gastro-esophageal reflux disease without esophagitis: Secondary | ICD-10-CM | POA: Diagnosis present

## 2020-06-11 DIAGNOSIS — Z9981 Dependence on supplemental oxygen: Secondary | ICD-10-CM | POA: Diagnosis not present

## 2020-06-11 DIAGNOSIS — Z825 Family history of asthma and other chronic lower respiratory diseases: Secondary | ICD-10-CM | POA: Diagnosis not present

## 2020-06-11 DIAGNOSIS — G4730 Sleep apnea, unspecified: Secondary | ICD-10-CM | POA: Diagnosis present

## 2020-06-11 DIAGNOSIS — I10 Essential (primary) hypertension: Secondary | ICD-10-CM | POA: Diagnosis not present

## 2020-06-11 DIAGNOSIS — Z87891 Personal history of nicotine dependence: Secondary | ICD-10-CM | POA: Diagnosis not present

## 2020-06-11 DIAGNOSIS — I161 Hypertensive emergency: Secondary | ICD-10-CM | POA: Diagnosis not present

## 2020-06-11 DIAGNOSIS — Z9049 Acquired absence of other specified parts of digestive tract: Secondary | ICD-10-CM | POA: Diagnosis not present

## 2020-06-11 DIAGNOSIS — J9611 Chronic respiratory failure with hypoxia: Secondary | ICD-10-CM | POA: Diagnosis not present

## 2020-06-11 LAB — GLUCOSE, CAPILLARY
Glucose-Capillary: 115 mg/dL — ABNORMAL HIGH (ref 70–99)
Glucose-Capillary: 116 mg/dL — ABNORMAL HIGH (ref 70–99)
Glucose-Capillary: 126 mg/dL — ABNORMAL HIGH (ref 70–99)
Glucose-Capillary: 144 mg/dL — ABNORMAL HIGH (ref 70–99)
Glucose-Capillary: 160 mg/dL — ABNORMAL HIGH (ref 70–99)
Glucose-Capillary: 180 mg/dL — ABNORMAL HIGH (ref 70–99)

## 2020-06-11 LAB — COMPREHENSIVE METABOLIC PANEL
ALT: 32 U/L (ref 0–44)
AST: 23 U/L (ref 15–41)
Albumin: 3.2 g/dL — ABNORMAL LOW (ref 3.5–5.0)
Alkaline Phosphatase: 51 U/L (ref 38–126)
Anion gap: 13 (ref 5–15)
BUN: 11 mg/dL (ref 8–23)
CO2: 28 mmol/L (ref 22–32)
Calcium: 8.7 mg/dL — ABNORMAL LOW (ref 8.9–10.3)
Chloride: 101 mmol/L (ref 98–111)
Creatinine, Ser: 1.19 mg/dL (ref 0.61–1.24)
GFR calc Af Amer: >60 mL/min (ref >60)
GFR calc non Af Amer: >60 mL/min (ref >60)
Glucose, Bld: 105 mg/dL — ABNORMAL HIGH (ref 70–99)
Potassium: 3.6 mmol/L (ref 3.5–5.1)
Sodium: 142 mmol/L (ref 135–145)
Total Bilirubin: 1 mg/dL (ref 0.3–1.2)
Total Protein: 5.5 g/dL — ABNORMAL LOW (ref 6.5–8.1)

## 2020-06-11 LAB — CBC WITH DIFFERENTIAL/PLATELET
Abs Immature Granulocytes: 0.02 10*3/uL (ref 0.00–0.07)
Basophils Absolute: 0 10*3/uL (ref 0.0–0.1)
Basophils Relative: 1 %
Eosinophils Absolute: 0.1 10*3/uL (ref 0.0–0.5)
Eosinophils Relative: 1 %
HCT: 40.7 % (ref 39.0–52.0)
Hemoglobin: 13.1 g/dL (ref 13.0–17.0)
Immature Granulocytes: 0 %
Lymphocytes Relative: 32 %
Lymphs Abs: 1.9 10*3/uL (ref 0.7–4.0)
MCH: 29.8 pg (ref 26.0–34.0)
MCHC: 32.2 g/dL (ref 30.0–36.0)
MCV: 92.7 fL (ref 80.0–100.0)
Monocytes Absolute: 0.5 10*3/uL (ref 0.1–1.0)
Monocytes Relative: 8 %
Neutro Abs: 3.4 10*3/uL (ref 1.7–7.7)
Neutrophils Relative %: 58 %
Platelets: 152 10*3/uL (ref 150–400)
RBC: 4.39 MIL/uL (ref 4.22–5.81)
RDW: 14.3 % (ref 11.5–15.5)
WBC: 6 10*3/uL (ref 4.0–10.5)
nRBC: 0 % (ref 0.0–0.2)

## 2020-06-11 LAB — MAGNESIUM: Magnesium: 1.6 mg/dL — ABNORMAL LOW (ref 1.7–2.4)

## 2020-06-11 MED ORDER — IBUPROFEN 200 MG PO TABS
400.0000 mg | ORAL_TABLET | Freq: Three times a day (TID) | ORAL | Status: DC | PRN
  Administered 2020-06-11 – 2020-06-13 (×3): 400 mg via ORAL
  Filled 2020-06-11 (×4): qty 2

## 2020-06-11 MED ORDER — CHLORHEXIDINE GLUCONATE CLOTH 2 % EX PADS
6.0000 | MEDICATED_PAD | Freq: Every day | CUTANEOUS | Status: DC
  Administered 2020-06-11 – 2020-06-13 (×3): 6 via TOPICAL

## 2020-06-11 MED ORDER — ENOXAPARIN SODIUM 80 MG/0.8ML ~~LOC~~ SOLN
0.5000 mg/kg | SUBCUTANEOUS | Status: DC
  Administered 2020-06-11 – 2020-06-12 (×2): 75 mg via SUBCUTANEOUS
  Filled 2020-06-11 (×2): qty 0.8

## 2020-06-11 MED ORDER — FUROSEMIDE 20 MG PO TABS
20.0000 mg | ORAL_TABLET | Freq: Every day | ORAL | Status: DC
  Administered 2020-06-11 – 2020-06-13 (×3): 20 mg via ORAL
  Filled 2020-06-11 (×3): qty 1

## 2020-06-11 MED ORDER — NITROGLYCERIN 0.4 MG SL SUBL: 0.4000 mg | SUBLINGUAL_TABLET | SUBLINGUAL | Status: DC | PRN

## 2020-06-11 MED ORDER — IRBESARTAN 300 MG PO TABS
300.0000 mg | ORAL_TABLET | Freq: Every day | ORAL | Status: DC
  Administered 2020-06-11 – 2020-06-13 (×3): 300 mg via ORAL
  Filled 2020-06-11 (×3): qty 1

## 2020-06-11 NOTE — Progress Notes (Signed)
LTM EEG discontinued - no skin breakdown at unhook.   

## 2020-06-11 NOTE — Progress Notes (Signed)
Inpatient Rehab Admissions:  Inpatient Rehab Consult received.  I met with patient at the bedside for rehabilitation assessment and to discuss goals and expectations of an inpatient rehab admission.  Pt acknowledged understanding of CIR goals and expectations.  At this time pt undecided if wants to pursue CIR.  Will follow up with pt at a later date.  Signed: Gayland Curry, [place], [place] Admissions Coordinator [phone]

## 2020-06-11 NOTE — Evaluation (Signed)
Clinical/Bedside Swallow Evaluation Patient Details  Name: Alfred Carney MRN: [id number] Date of Birth: 12/02/1950  Today's Date: 06/11/2020 Time: SLP Start Time (ACUTE ONLY): 0900 SLP Stop Time (ACUTE ONLY): 0913 SLP Time Calculation (min) (ACUTE ONLY): 13 min  Past Medical History:  Past Medical History:  Diagnosis Date  . Chronic obstructive pulmonary disease ([place]) 2008   Moderate  . Colon polyps   . Community acquired pneumonia   . Coronary artery disease   . Degenerative joint disease   . Diabetes mellitus without complication ([place])    type 2  . Emphysema lung ([place])   . Enlarged liver    fatty liver by '09 CT  . Gastroesophageal reflux disease   . Gout   . H/O hiatal hernia   . History of Rocky [place] spotted fever    Possible history of [place] Spotted Fever  . Hyperlipidemia   . Hypertension   . Hypokalemia    diuretic induced, resolved  . Microcytic anemia    iron pills  . Morbid obesity ([place])   . Shortness of breath   . Skin cancer    skin cancer lip removed  . Sleep apnea    not currently using CPAP 05/20/13  . 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 CARDIAC CATHETERIZATION  2007   Ejection fraction is estimated at 60%  . CHOLECYSTECTOMY    . COLONOSCOPY WITH PROPOFOL N/A 01/22/2018   Procedure: COLONOSCOPY WITH PROPOFOL;  Surgeon: Mauri Pole, MD;  Location: [facility];  Service: Endoscopy;  Laterality: N/A;  . DG NERVE ROOT BLOCK LUMBAR-SACRAL EACH ADD. LEVEL  10/23/2018      . ESOPHAGOGASTRODUODENOSCOPY (EGD) WITH PROPOFOL N/A 01/22/2018   Procedure: ESOPHAGOGASTRODUODENOSCOPY (EGD) WITH PROPOFOL;  Surgeon: Mauri Pole, MD;  Location: [facility];  Service: Endoscopy;  Laterality: N/A;  . HARDWARE REMOVAL Left 05/26/2013   Procedure: HARDWARE REMOVAL;  Surgeon: Newt Minion, MD;  Location: [place];  Service: Orthopedics;   Laterality: Left;  Left Total Hip Arthroplasty, Removal of Deep Hardware  . HIP SURGERY     Status post left hip surgery with bone grafting  . IR INJECT/THERA/INC NEEDLE/CATH/PLC EPI/LUMB/SAC W/IMG  10/23/2018  . LEFT HEART CATHETERIZATION WITH CORONARY ANGIOGRAM N/A 01/31/2014   Procedure: LEFT HEART CATHETERIZATION WITH CORONARY ANGIOGRAM;  Surgeon: Burnell Blanks, MD;  Location: [facility] CATH LAB;  Service: Cardiovascular;  Laterality: N/A;  . LUMBAR LAMINECTOMY/DECOMPRESSION MICRODISCECTOMY Left 10/28/2018   Procedure: Left Lumbar three-four Extraforaminal microdiscectomy;  Surgeon: Jovita Gamma, MD;  Location: [place];  Service: Neurosurgery;  Laterality: Left;  . THORACOABDOMINAL AORTIC ANEURYSM REPAIR     with right femoral and left iliac BPG and reimplantation of renal arteries.  . TONSILLECTOMY    . TONSILLECTOMY    . TOTAL HIP ARTHROPLASTY Left 05/26/2013   Procedure: TOTAL HIP ARTHROPLASTY;  Surgeon: Newt Minion, MD;  Location: [place];  Service: Orthopedics;  Laterality: Left;  Left Total Hip Arthroplasty, Removal Deep Hardware   HPI:  The pt is a 69 yo male presenting with AMS, headaches, weakness, and worsening command following. Code stroke was activated upon arrival, MRI showed "signal abnormality involving the bilateral parietal and occipital regions,left slightly worse than right, most consistent with PRES." PMH includes: COPD, CAD, DM II, GERD, HLD, HTN, and morbid obesity.   Assessment / Plan / Recommendation Clinical Impression  Pt was seen for a  bedside swallow evaluation and he presents with suspected functional oropharyngeal swallowing abilities.  Pt was encountered awake/alert with son present at bedside.  Oral mechanism exam was unremarkable.  Pt consumed trials of thin liquid and regular solids from his breakfast meal tray.  Pt fed himself independently and he demonstrated good bolus acceptance, timely mastication, suspected timely AP transport/swallow initiation, and  consistent hyolaryngeal elevation/excursion to observation and palpation.  No overt s/sx of aspiration were observed with any trials.  Recommend continuation of regular solids and thin liquids with medications administered whole with liquid.  No further skilled ST is warranted at this time regarding swallow function.  Please re-consult if additional needs arise.    SLP Visit Diagnosis: Dysphagia, unspecified (R13.10)    Aspiration Risk  No limitations    Diet Recommendation Regular;Thin liquid   Liquid Administration via: Cup;Straw Medication Administration: Whole meds with liquid Supervision: Patient able to self feed    Other  Recommendations Oral Care Recommendations: Oral care BID   Follow up Recommendations None (for dysphagia )       Swallow Study   General HPI: The pt is a 69 yo male presenting with AMS, headaches, weakness, and worsening command following. Code stroke was activated upon arrival, MRI showed "signalabnormality involving the bilateral parietal and occipital regions,left slightly worse than right, most consistent with PRES." PMH includes: COPD, CAD, DM II, GERD, HLD, HTN, and morbid obesity. Type of Study: Bedside Swallow Evaluation Previous Swallow Assessment: None Diet Prior to this Study: Regular;Thin liquids Temperature Spikes Noted: No Respiratory Status: Nasal cannula History of Recent Intubation: No Behavior/Cognition: Alert;Cooperative;Pleasant mood Oral Cavity Assessment: Within Functional Limits Oral Care Completed by SLP: No Oral Cavity - Dentition: Dentures, top;Dentures, bottom Vision: Functional for self-feeding Self-Feeding Abilities: Able to feed self Patient Positioning: Upright in bed Baseline Vocal Quality: Normal Volitional Swallow: Able to elicit    Oral/Motor/Sensory Function Overall Oral Motor/Sensory Function: Within functional limits   Ice Chips Ice chips: Not tested   Thin Liquid Thin Liquid: Within functional limits Presentation:  Cup;Straw    Nectar Thick Nectar Thick Liquid: Not tested   Honey Thick Honey Thick Liquid: Not tested   Puree Puree: Not tested   Solid     Solid: Within functional limits Presentation: Self Fed     [name] Mulders., M.S., CCC-SLP Acute Rehabilitation Services Office: [phone]  Elvia Collum Aldred Mase 06/11/2020,9:45 AM

## 2020-06-11 NOTE — Procedures (Addendum)
Patient Name: Alfred Carney  MRN: [id number]  Epilepsy Attending: Lora Havens  Referring Physician/Provider: Dr Kerney Elbe Duration: 06/10/2020 2145 to 06/11/2020 1211  Patient history: 69 y.o.malepresenting with acute onset of visual loss and mutism. EEG to evaluate for seizure.  Level of alertness:  Awake, asleep  AEDs during EEG study: None  Technical aspects: This EEG study was done with scalp electrodes positioned according to the 10-20 International system of electrode placement. Electrical activity was acquired at a sampling rate of 500Hz  and reviewed with a high frequency filter of 70Hz  and a low frequency filter of 1Hz . EEG data were recorded continuously and digitally stored.   Description: No posterior dominant rhythm was seen. Sleep was characterized by vertex waves, sleep spindles (12-14hz ), maximal frontocentral region. EEG showed continuous generalized 3 to 6 Hz theta-delta slowing. Physiologic photic driving was not seen during photic stimulation.  Hyperventilation was not performed.    EKG artifact was also noted.    ABNORMALITY -Continuous slow, generalized  IMPRESSION: This study is suggestive of moderate diffuse encephalopathy, nonspecific etiology. No seizures or epileptiform discharges were seen throughout the recording.  Bookert Guzzi Barbra Sarks

## 2020-06-11 NOTE — Evaluation (Signed)
Speech Language Pathology Evaluation Patient Details Name: Alfred Carney MRN: [id number] DOB: 1951-03-05 Today's Date: 06/11/2020 Time: 4098-1191 SLP Time Calculation (min) (ACUTE ONLY): 26 min  Problem List:  Patient Active Problem List   Diagnosis Date Noted  . Acute metabolic encephalopathy 47/82/9562  . Leukocytosis 10/21/2019  . Abdominal aortic aneurysm, ruptured ([place]) 10/21/2019  . Tailbone injury, initial encounter 02/15/2019  . Chronic respiratory failure with hypoxia and hypercapnia ([place]) 02/05/2019  . Dandruff in adult 11/04/2018  . Benign prostatic hyperplasia 11/04/2018  . Type 2 diabetes mellitus with stage 2 chronic kidney disease, without long-term current use of insulin ([place]) 11/04/2018  . S/P discectomy 11/04/2018  . Back pain 10/22/2018  . Left hip pain 10/21/2018  . Solitary pulmonary nodule on lung CT 01/29/2018  . Polyp of cecum   . Dyspnea on exertion 11/19/2017  . Chronic respiratory failure with hypoxia ([place]) 11/19/2017  . COPD GOLD II 05/17/2015  . Morbid obesity due to excess calories ([place]) 05/17/2015  . 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 Chest pain, negative MI, stable CT angio of chest, stable non obstructive CAD -possible GI cause 01/28/2014  . CAD in native artery 09/25/2011  . PAD (peripheral artery disease) ([place]) 09/25/2011  . Hyperlipidemia 09/25/2011  . Essential hypertension 09/25/2011  . ABDOMINAL AORTIC ANEURYSM, HX OF 06/30/2007   Past Medical History:  Past Medical History:  Diagnosis Date  . Chronic obstructive pulmonary disease ([place]) 2008   Moderate  . Colon polyps   . Community acquired pneumonia   . Coronary artery disease   . Degenerative joint disease   . Diabetes mellitus without complication ([place])    type 2  . Emphysema lung ([place])   . Enlarged liver    fatty liver by '09 CT  . Gastroesophageal reflux disease   . Gout   . H/O hiatal hernia   . History of [place] spotted fever    Possible history of [place] Spotted Fever  . Hyperlipidemia   . Hypertension   . Hypokalemia    diuretic induced, resolved  . Microcytic anemia    iron pills  . Morbid obesity ([place])   . Shortness of breath   . Skin cancer    skin cancer lip removed  . Sleep apnea    not currently using CPAP 05/20/13  . 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 CARDIAC CATHETERIZATION  2007   Ejection fraction is estimated at 60%  . CHOLECYSTECTOMY    . COLONOSCOPY WITH PROPOFOL N/A 01/22/2018   Procedure: COLONOSCOPY WITH PROPOFOL;  Surgeon: Mauri Pole, MD;  Location: [facility];  Service: Endoscopy;  Laterality: N/A;  . DG NERVE ROOT BLOCK LUMBAR-SACRAL EACH ADD. LEVEL  10/23/2018      . ESOPHAGOGASTRODUODENOSCOPY (EGD) WITH PROPOFOL N/A 01/22/2018   Procedure: ESOPHAGOGASTRODUODENOSCOPY (EGD) WITH PROPOFOL;  Surgeon: Mauri Pole, MD;  Location: [facility];  Service: Endoscopy;  Laterality: N/A;  . HARDWARE REMOVAL Left 05/26/2013   Procedure: HARDWARE REMOVAL;  Surgeon: Newt Minion, MD;  Location: [facility];  Service: Orthopedics;  Laterality: Left;  Left Total Hip Arthroplasty, Removal of Deep Hardware  . HIP SURGERY     Status post left hip surgery with bone grafting  . IR INJECT/THERA/INC NEEDLE/CATH/PLC EPI/LUMB/SAC W/IMG  10/23/2018  . LEFT HEART CATHETERIZATION WITH CORONARY ANGIOGRAM N/A 01/31/2014  Procedure: LEFT HEART CATHETERIZATION WITH CORONARY ANGIOGRAM;  Surgeon: Burnell Blanks, MD;  Location: [facility] CATH LAB;  Service: Cardiovascular;  Laterality: N/A;  . LUMBAR LAMINECTOMY/DECOMPRESSION MICRODISCECTOMY Left 10/28/2018   Procedure: Left Lumbar three-four Extraforaminal microdiscectomy;  Surgeon: Jovita Gamma, MD;  Location: [facility];  Service: Neurosurgery;  Laterality: Left;  . THORACOABDOMINAL AORTIC ANEURYSM REPAIR      with right femoral and left iliac BPG and reimplantation of renal arteries.  . TONSILLECTOMY    . TONSILLECTOMY    . TOTAL HIP ARTHROPLASTY Left 05/26/2013   Procedure: TOTAL HIP ARTHROPLASTY;  Surgeon: Newt Minion, MD;  Location: [facility];  Service: Orthopedics;  Laterality: Left;  Left Total Hip Arthroplasty, Removal Deep Hardware   HPI:  The pt is a 69 yo male presenting with AMS, headaches, weakness, and worsening command following. Code stroke was activated upon arrival, MRI showed "signal abnormality involving the bilateral parietal and occipital regions,left slightly worse than right, most consistent with PRES." PMH includes: COPD, CAD, DM II, GERD, HLD, HTN, and morbid obesity.    Assessment / Plan / Recommendation Clinical Impression  Pt was seen for a cognitive-linguistic evaluation and he presents with mild cognitive deficits c/b difficulty with short-term memory, attention, and problem solving.  Pt was encountered awake/alert with son present at bedside and he was agreeable to this evaluation.  Pt and son reported that pt's speech, language, and cognitive abilities appear to be at or close to his baseline.  Per pt report, he lives at home with his wife and she is responsible for managing their finances.  The pt is independent with IADLs including his medication management.  Pt reported some baseline short-term memory deficits and he stated that he utilizes strategies (such as writing things down) to assist him with his memory.  Pt completed portions of the [place] examination in addition to informal evaluation measures.  He was unable to complete visuospatial tasks at this time secondary to vision deficits.  He recalled 2/5 items following a 5 minute delay improving to 5/5 given mod semantic cues.  He was able to complete money based problem solving tasks fairly easily, but had difficulty with time-based problem solving questions.  Expressive and receptive language appeared to be functional  and no dysarthria was observed.  Recommend ST f/u acutely targeting cognitive deficits and assistance with IADLs at time of discharge.  Will defer to PT/OT for additional discharge recommendations.      SLP Assessment  SLP Visit Diagnosis: Cognitive communication deficit (R41.841)    Follow Up Recommendations  Supervision and assistance with IADLs; Defer to PT/OT   Frequency and Duration min 1 x/week  2 weeks      SLP Evaluation Cognition  Overall Cognitive Status: Impaired/Different from baseline Arousal/Alertness: Awake/alert Orientation Level: Oriented X4 Attention: Sustained;Focused Focused Attention: Appears intact Sustained Attention: Impaired Sustained Attention Impairment: Verbal complex Memory: Impaired Memory Impairment: Decreased short term memory;Decreased recall of new information Decreased Short Term Memory: Verbal basic Awareness: Appears intact Problem Solving: Impaired Problem Solving Impairment: Verbal complex;Functional complex Safety/Judgment: Appears intact       Comprehension  Auditory Comprehension Overall Auditory Comprehension: Appears within functional limits for tasks assessed    Expression Expression Primary Mode of Expression: Verbal Verbal Expression Overall Verbal Expression: Appears within functional limits for tasks assessed   Oral / Motor  Oral Motor/Sensory Function Overall Oral Motor/Sensory Function: Within functional limits Motor Speech Overall Motor Speech: Appears within functional limits for tasks assessed   GO  [name] Mulders., M.S., [facility] Office: [phone]  [place] 06/11/2020, 10:02 AM

## 2020-06-11 NOTE — Progress Notes (Addendum)
Patient ID: Alfred Carney, male   DOB: [birth year], 69 y.o.   MRN: [id number]  PROGRESS NOTE    Alfred Carney  TSV:[id number] DOB: May 31, 1951 DOA: 06/10/2020 PCP: Lesleigh Noe, MD   Brief Narrative:  69 year old male with history of COPD, coronary artery disease, diabetes mellitus type 2, GERD, fatty liver, hyperlipidemia, essential hypertension, morbid obesity, chronic hypoxia on 2 L oxygen at home, thoracoabdominal aortic aneurysm status post repair, OSA presented with difficulty speaking followed by being nonverbal along with vision loss.  On presentation, code stroke was called.  CT of the head was negative for acute intracranial abnormality.  Neurology was consulted.  CTA of the head and neck was negative for large vessel occlusion.  CT perfusion was negative for acute infarct or perfusion defect.  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  - CT of the head was negative for acute intracranial abnormality.  Neurology was consulted.   -CTA of the head and neck was negative for large vessel occlusion.  CT perfusion was negative for acute infarct or perfusion defect. -MRI of the brain showed findings most consistent with press with no acute intracranial infarct or other abnormality.  MRA of the head was negative for large vessel occlusion. -EEG was negative for any seizure activities. -PT/OT recommended CIR placement.  CIR has been consulted.   -Echo showed EF of 60 to 65% with grade 1 diastolic dysfunction  -carotid ultrasound showed right ICA 1 to 39% stenosis and left ICA 40 to 59% stenosis -Continue neurochecks.  Fall precautions. -SLP evaluation  Diabetes mellitus type 2 with hyperglycemia -A1c 6.9.  Continue CBGs with SSI.  Hypertension-blood pressure on the higher side.  Monitor.  Continue metoprolol.  We  will add antihypertensives.  Goal blood pressure on discharge 140/90 as per neurology recommendations  Morbid obesity -Outpatient follow-up  BPH -Continue tamsulosin  Hyperlipidemia -continue statin  Chronic hypoxic respiratory failure -Uses nasal cannula oxygen at 2L/min at home.  Continue oxygen supplementation.  Respiratory status stable.   DVT prophylaxis: Start Lovenox Code Status: Full Family Communication: Son on phone on 06/11/2020 Disposition Plan: Status is: Observation  The patient will require care spanning > 2 midnights and should be moved to inpatient because: Inpatient level of care appropriate due to severity of illness.  PT/OT recommended CIR placement  Dispo:  Patient From: Home  Planned Disposition: Inpatient Rehab  Expected discharge date: 06/11/20  Medically stable for discharge: Yes   Consultants: Neurology  Procedures: 2D echo/EEG/carotid ultrasound  Antimicrobials: None   Subjective: Patient seen and examined at bedside.  Speech is improving.  Vision is improving but still having blurred vision and problems with vision.  No overnight fever, vomiting, or worsening shortness of breath reported.  Objective: Vitals:   06/10/20 2014 06/10/20 2327 06/11/20 0415 06/11/20 0723  BP: (!) 160/59 (!) 133/51 (!) 173/60 (!) 180/52  Pulse: (!) 57 64 65 66  Resp: 18 20 17    Temp: 97.7 F (36.5 C) 97.7 F (36.5 C) 97.7 F (36.5 C) 98.4 F (36.9 C)  TempSrc: Oral Oral Oral Oral  SpO2: 97% 98% 95% 96%  Weight:      Height:        Intake/Output Summary (Last 24 hours) at 06/11/2020 0829 Last data filed at 06/10/2020 1600 Gross per 24 hour  Intake 3 ml  Output 240 ml  Net -  237 ml   Filed Weights   06/10/20 1841  Weight: (!) 150.5 kg    Examination:  General exam: Appears calm and comfortable.  Chronically ill looking.  Currently on 2 L oxygen via nasal cannula. Respiratory system: Bilateral decreased breath sounds at bases with some scattered  crackles Cardiovascular system: S1 & S2 heard, Rate controlled Gastrointestinal system: Abdomen is morbidly obese, nondistended, soft and nontender. Normal bowel sounds heard. Extremities: No cyanosis, clubbing; trace lower extremity edema Central nervous system: Awake, poor historian.  Speech is improving but still not fully comprehensible.  No focal neurological deficits. Moving extremities Skin: No rashes, lesions or ulcers Psychiatry: Flat affect.   Data Reviewed: I have personally reviewed following labs and imaging studies  CBC: Recent Labs  Lab 06/10/20 0048 06/10/20 0049  WBC 7.0  --   NEUTROABS 4.2  --   HGB 14.6 14.3  HCT 45.1 42.0  MCV 95.1  --   PLT 164  --    Basic Metabolic Panel: Recent Labs  Lab 06/10/20 0048 06/10/20 0049  NA 143 145  K 4.0 3.7  CL 106 104  CO2 27  --   GLUCOSE 182* 168*  BUN 14 15  CREATININE 1.18 1.10  CALCIUM 8.8*  --    GFR: Estimated Creatinine Clearance: 90.7 mL/min (by C-G formula based on SCr of 1.1 mg/dL). Liver Function Tests: Recent Labs  Lab 06/10/20 0048  AST 22  ALT 38  ALKPHOS 63  BILITOT 0.7  PROT 6.2*  ALBUMIN 3.6   No results for input(s): LIPASE, AMYLASE in the last 168 hours. No results for input(s): AMMONIA in the last 168 hours. Coagulation Profile: Recent Labs  Lab 06/10/20 0048  INR 1.1   Cardiac Enzymes: No results for input(s): CKTOTAL, CKMB, CKMBINDEX, TROPONINI in the last 168 hours. BNP (last 3 results) No results for input(s): PROBNP in the last 8760 hours. HbA1C: Recent Labs    06/10/20 1220  HGBA1C 6.9*   CBG: Recent Labs  Lab 06/10/20 1154 06/10/20 1552 06/10/20 2016 06/11/20 0022 06/11/20 0418  GLUCAP 112* 137* 156* 116* 126*   Lipid Profile: Recent Labs    06/10/20 0048  CHOL 147  HDL 36*  LDLCALC 90  TRIG 107  CHOLHDL 4.1   Thyroid Function Tests: No results for input(s): TSH, T4TOTAL, FREET4, T3FREE, THYROIDAB in the last 72 hours. Anemia Panel: No results  for input(s): VITAMINB12, FOLATE, FERRITIN, TIBC, IRON, RETICCTPCT in the last 72 hours. Sepsis Labs: No results for input(s): PROCALCITON, LATICACIDVEN in the last 168 hours.  Recent Results (from the past 240 hour(s))  Respiratory Panel by RT PCR (Flu A&B, Covid) - Nasopharyngeal Swab     Status: None   Collection Time: 06/10/20  3:23 AM   Specimen: Nasopharyngeal Swab  Result Value Ref Range Status   SARS Coronavirus 2 by RT PCR NEGATIVE NEGATIVE Final    Comment: (NOTE) SARS-CoV-2 target nucleic acids are NOT DETECTED.  The SARS-CoV-2 RNA is generally detectable in upper respiratoy specimens during the acute phase of infection. The lowest concentration of SARS-CoV-2 viral copies this assay can detect is 131 copies/mL. A negative result does not preclude SARS-Cov-2 infection and should not be used as the sole basis for treatment or other patient management decisions. A negative result may occur with  improper specimen collection/handling, submission of specimen other than nasopharyngeal swab, presence of viral mutation(s) within the areas targeted by this assay, and inadequate number of viral copies (<131 copies/mL). A negative result must  be combined with clinical observations, patient history, and epidemiological information. The expected result is Negative.  Fact Sheet for Patients:  PinkCheek.be  Fact Sheet for Healthcare Providers:  GravelBags.it  This test is no t yet approved or cleared by the [place] FDA and  has been authorized for detection and/or diagnosis of SARS-CoV-2 by FDA under an Emergency Use Authorization (EUA). This EUA will remain  in effect (meaning this test can be used) for the duration of the COVID-19 declaration under Section 564(b)(1) of the Act, 21 U.S.C. section 360bbb-3(b)(1), unless the authorization is terminated or revoked sooner.     Influenza A by PCR NEGATIVE NEGATIVE Final     Influenza B by PCR NEGATIVE NEGATIVE Final    Comment: (NOTE) The Xpert Xpress SARS-CoV-2/FLU/RSV assay is intended as an aid in  the diagnosis of influenza from Nasopharyngeal swab specimens and  should not be used as a sole basis for treatment. Nasal washings and  aspirates are unacceptable for Xpert Xpress SARS-CoV-2/FLU/RSV  testing.  Fact Sheet for Patients: PinkCheek.be  Fact Sheet for Healthcare Providers: GravelBags.it  This test is not yet approved or cleared by the [place] FDA and  has been authorized for detection and/or diagnosis of SARS-CoV-2 by  FDA under an Emergency Use Authorization (EUA). This EUA will remain  in effect (meaning this test can be used) for the duration of the  Covid-19 declaration under Section 564(b)(1) of the Act, 21  U.S.C. section 360bbb-3(b)(1), unless the authorization is  terminated or revoked. Performed at [facility], [address] [place], [place] [address]          Radiology Studies: CT Code Stroke CTA Head W/WO contrast  Result Date: 06/10/2020 CLINICAL DATA:  Initial evaluation for acute neuro deficit, stroke suspected, right-sided weakness, visual loss. EXAM: CT ANGIOGRAPHY HEAD AND NECK CT PERFUSION BRAIN TECHNIQUE: Multidetector CT imaging of the head and neck was performed using the standard protocol during bolus administration of intravenous contrast. Multiplanar CT image reconstructions and MIPs were obtained to evaluate the vascular anatomy. Carotid stenosis measurements (when applicable) are obtained utilizing NASCET criteria, using the distal internal carotid diameter as the denominator. Multiphase CT imaging of the brain was performed following IV bolus contrast injection. Subsequent parametric perfusion maps were calculated using RAPID software. CONTRAST:  148mL OMNIPAQUE IOHEXOL 350 MG/ML SOLN COMPARISON:  Prior head CT from earlier the same day.  FINDINGS: CTA NECK FINDINGS Aortic arch: Visualized aortic arch of normal caliber with normal 3 vessel morphology. Moderate atheromatous change about the arch and origin of the great vessels without hemodynamically significant stenosis. Right carotid system: Right CCA patent from its origin to the bifurcation without stenosis. Scattered calcified plaque about the right bifurcation/proximal right ICA without significant stenosis. Additional scattered eccentric calcified plaque within the distal right ICA just prior to the skull base with no more than mild stenosis of up to 35% by NASCET criteria. No dissection or other acute vascular abnormality. Left carotid system: Left CCA patent from its origin to the bifurcation without stenosis. Extensive bulky calcified plaque about the left bifurcation/proximal left ICA with associated stenosis of up to approximately 70% by NASCET criteria. Additional scattered eccentric calcified plaque within the distal cervical left ICA just prior to the skull base without hemodynamically significant stenosis. No dissection or other acute vascular abnormality. Vertebral arteries: Both vertebral arteries arise from the subclavian arteries. No hemodynamically significant proximal subclavian artery stenosis. Atheromatous change at the origin of the right vertebral artery with  no more than mild stenosis. Proximal vertebral arteries not well assessed due to motion, but grossly patent without abnormality. Visualized portions of the vertebral arteries patent without dissection, stenosis, or occlusion. Skeleton: No acute osseous abnormality. No discrete or worrisome osseous lesions. Patient is edentulous. Other neck: No other acute soft tissue abnormality within the neck. No mass lesion or adenopathy. Upper chest: Visualized upper chest demonstrates no acute finding. Scattered atelectatic changes noted within the visualized lungs. Review of the MIP images confirms the above findings CTA HEAD  FINDINGS Anterior circulation: Petrous segments patent bilaterally. Scattered atheromatous plaque throughout the cavernous/supraclinoid ICAs with associated mild to moderate multifocal narrowing. ICA termini well perfused. A1 segments patent bilaterally. Normal anterior communicating artery complex. Anterior cerebral arteries patent to their distal aspects without stenosis. No M1 stenosis or occlusion. Normal MCA bifurcations. Distal MCA branches well perfused and symmetric. Posterior circulation: Mild nonstenotic plaque noted within the V4 segments bilaterally. Both vertebral arteries patent to the vertebrobasilar junction without stenosis. Both picas patent. Basilar widely patent to its distal aspect without stenosis. Superior cerebral arteries patent bilaterally. Both PCAs primarily supplied via the basilar well perfused or distal aspects. Venous sinuses: Patent. Anatomic variants: None significant.  No intracranial aneurysm. Review of the MIP images confirms the above findings CT Brain Perfusion Findings: ASPECTS: 10 CBF (<30%) Volume: 45mL Perfusion (Tmax>6.0s) volume: 70mL Mismatch Volume: 51mL Infarction Location:Negative CT perfusion with no evidence for acute core infarct or other perfusion deficit. IMPRESSION: CTA HEAD AND NECK IMPRESSION: 1. Negative CTA for emergent large vessel occlusion. 2. 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 3. Additional moderate intracranial atheromatous change elsewhere about the major arterial vasculature of the head and neck. No other proximal high-grade or correctable stenosis. CT PERFUSION IMPRESSION: Negative CT perfusion. No evidence for acute core infarct or other perfusion deficit. Critical Value/emergent results were called by telephone at the time of interpretation on 06/10/2020 at 1:17 am to provider ERIC [facility] , who verbally acknowledged these results. Electronically Signed   By: Jeannine Boga M.D.   On: 06/10/2020 01:46    CT Code Stroke CTA Neck W/WO contrast  Result Date: 06/10/2020 CLINICAL DATA:  Initial evaluation for acute neuro deficit, stroke suspected, right-sided weakness, visual loss. EXAM: CT ANGIOGRAPHY HEAD AND NECK CT PERFUSION BRAIN TECHNIQUE: Multidetector CT imaging of the head and neck was performed using the standard protocol during bolus administration of intravenous contrast. Multiplanar CT image reconstructions and MIPs were obtained to evaluate the vascular anatomy. Carotid stenosis measurements (when applicable) are obtained utilizing NASCET criteria, using the distal internal carotid diameter as the denominator. Multiphase CT imaging of the brain was performed following IV bolus contrast injection. Subsequent parametric perfusion maps were calculated using RAPID software. CONTRAST:  167mL OMNIPAQUE IOHEXOL 350 MG/ML SOLN COMPARISON:  Prior head CT from earlier the same day. FINDINGS: CTA NECK FINDINGS Aortic arch: Visualized aortic arch of normal caliber with normal 3 vessel morphology. Moderate atheromatous change about the arch and origin of the great vessels without hemodynamically significant stenosis. Right carotid system: Right CCA patent from its origin to the bifurcation without stenosis. Scattered calcified plaque about the right bifurcation/proximal right ICA without significant stenosis. Additional scattered eccentric calcified plaque within the distal right ICA just prior to the skull base with no more than mild stenosis of up to 35% by NASCET criteria. No dissection or other acute vascular abnormality. Left carotid system: Left CCA patent from its origin to the bifurcation without stenosis. Extensive bulky calcified  plaque about the left bifurcation/proximal left ICA with associated stenosis of up to approximately 70% by NASCET criteria. Additional scattered eccentric calcified plaque within the distal cervical left ICA just prior to the skull base without hemodynamically significant  stenosis. No dissection or other acute vascular abnormality. Vertebral arteries: Both vertebral arteries arise from the subclavian arteries. No hemodynamically significant proximal subclavian artery stenosis. Atheromatous change at the origin of the right vertebral artery with no more than mild stenosis. Proximal vertebral arteries not well assessed due to motion, but grossly patent without abnormality. Visualized portions of the vertebral arteries patent without dissection, stenosis, or occlusion. Skeleton: No acute osseous abnormality. No discrete or worrisome osseous lesions. Patient is edentulous. Other neck: No other acute soft tissue abnormality within the neck. No mass lesion or adenopathy. Upper chest: Visualized upper chest demonstrates no acute finding. Scattered atelectatic changes noted within the visualized lungs. Review of the MIP images confirms the above findings CTA HEAD FINDINGS Anterior circulation: Petrous segments patent bilaterally. Scattered atheromatous plaque throughout the cavernous/supraclinoid ICAs with associated mild to moderate multifocal narrowing. ICA termini well perfused. A1 segments patent bilaterally. Normal anterior communicating artery complex. Anterior cerebral arteries patent to their distal aspects without stenosis. No M1 stenosis or occlusion. Normal MCA bifurcations. Distal MCA branches well perfused and symmetric. Posterior circulation: Mild nonstenotic plaque noted within the V4 segments bilaterally. Both vertebral arteries patent to the vertebrobasilar junction without stenosis. Both picas patent. Basilar widely patent to its distal aspect without stenosis. Superior cerebral arteries patent bilaterally. Both PCAs primarily supplied via the basilar well perfused or distal aspects. Venous sinuses: Patent. Anatomic variants: None significant.  No intracranial aneurysm. Review of the MIP images confirms the above findings CT Brain Perfusion Findings: ASPECTS: 10 CBF (<30%)  Volume: 57mL Perfusion (Tmax>6.0s) volume: 53mL Mismatch Volume: 44mL Infarction Location:Negative CT perfusion with no evidence for acute core infarct or other perfusion deficit. IMPRESSION: CTA HEAD AND NECK IMPRESSION: 1. Negative CTA for emergent large vessel occlusion. 2. 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 3. Additional moderate intracranial atheromatous change elsewhere about the major arterial vasculature of the head and neck. No other proximal high-grade or correctable stenosis. CT PERFUSION IMPRESSION: Negative CT perfusion. No evidence for acute core infarct or other perfusion deficit. Critical Value/emergent results were called by telephone at the time of interpretation on 06/10/2020 at 1:17 am to provider ERIC [facility] , who verbally acknowledged these results. Electronically Signed   By: Jeannine Boga M.D.   On: 06/10/2020 01:46   MR ANGIO HEAD WO CONTRAST  Result Date: 06/10/2020 CLINICAL DATA:  Initial evaluation for acute TIA. EXAM: MRI HEAD WITHOUT CONTRAST MRA HEAD WITHOUT CONTRAST TECHNIQUE: Multiplanar, multiecho pulse sequences of the brain and surrounding structures were obtained without intravenous contrast. Angiographic images of the head were obtained using MRA technique without contrast. COMPARISON:  Prior CTs from earlier the same day. FINDINGS: MRI HEAD FINDINGS Brain: Examination degraded by motion artifact. Diffuse prominence of the CSF containing spaces compatible generalized age-related cerebral atrophy. No significant cerebral white matter disease for age. No abnormal foci of restricted diffusion to suggest acute or subacute ischemia. No encephalomalacia to suggest chronic cortical infarction. No foci of susceptibility artifact to suggest acute or chronic intracranial hemorrhage. Patchy multifocal cortical subcortical T2/FLAIR signal abnormality seen involving the bilateral parietal and occipital regions, left slightly worse than  right, most consistent with PRES. Mild patchy involvement of the left greater than right frontal parietal regions noted as well, watershed in  distribution. Probable subtle cerebellar involvement noted as well. No associated hemorrhage or mass effect. No mass lesion, midline shift or mass effect. No hydrocephalus or extra-axial fluid collection. Pituitary gland suprasellar region normal. Midline structures intact. Vascular: Major intracranial vascular flow voids are maintained. Skull and upper cervical spine: Craniocervical junction within normal limits. Bone marrow signal intensity normal. No scalp soft tissue abnormality. Sinuses/Orbits: Globes and orbital soft tissues demonstrate no acute finding. Paranasal sinuses are clear. Small bilateral mastoid effusions noted, of doubtful significance. Visualized nasopharynx within normal limits. Other: None. MRA HEAD FINDINGS Examination moderately degraded by motion artifact. ANTERIOR CIRCULATION: Both internal carotid arteries patent to the termini without stenosis or other abnormality. A1 segments patent bilaterally. Normal anterior communicating artery complex. Short-segment mild proximal left A2 stenosis noted. ACAs otherwise patent to their distal aspects without stenosis. No M1 stenosis or occlusion. Normal MCA bifurcations. Distal MCA branches well perfused and symmetric. POSTERIOR CIRCULATION: Visualized vertebral arteries widely patent to the vertebrobasilar junction without stenosis. Both picas patent proximally. Basilar patent to its distal aspect without stenosis. Superior cerebral arteries patent bilaterally. Both PCAs primarily supplied via the basilar well perfused or distal aspects. No intracranial aneurysm. IMPRESSION: MRI HEAD IMPRESSION: 1. Patchy multifocal cortical and subcortical T2/FLAIR signal abnormality involving the bilateral parietal and occipital regions, left slightly worse than right, most consistent with PRES. No associated hemorrhage or  mass effect. 2. Underlying age-related cerebral atrophy. No acute intracranial infarct or other abnormality. MRA HEAD IMPRESSION: Negative intracranial MRA. No large vessel occlusion, hemodynamically significant stenosis, or other acute vascular abnormality. No aneurysm. Electronically Signed   By: Jeannine Boga M.D.   On: 06/10/2020 06:57   MR BRAIN WO CONTRAST  Result Date: 06/10/2020 CLINICAL DATA:  Initial evaluation for acute TIA. EXAM: MRI HEAD WITHOUT CONTRAST MRA HEAD WITHOUT CONTRAST TECHNIQUE: Multiplanar, multiecho pulse sequences of the brain and surrounding structures were obtained without intravenous contrast. Angiographic images of the head were obtained using MRA technique without contrast. COMPARISON:  Prior CTs from earlier the same day. FINDINGS: MRI HEAD FINDINGS Brain: Examination degraded by motion artifact. Diffuse prominence of the CSF containing spaces compatible generalized age-related cerebral atrophy. No significant cerebral white matter disease for age. No abnormal foci of restricted diffusion to suggest acute or subacute ischemia. No encephalomalacia to suggest chronic cortical infarction. No foci of susceptibility artifact to suggest acute or chronic intracranial hemorrhage. Patchy multifocal cortical subcortical T2/FLAIR signal abnormality seen involving the bilateral parietal and occipital regions, left slightly worse than right, most consistent with PRES. Mild patchy involvement of the left greater than right frontal parietal regions noted as well, watershed in distribution. Probable subtle cerebellar involvement noted as well. No associated hemorrhage or mass effect. No mass lesion, midline shift or mass effect. No hydrocephalus or extra-axial fluid collection. Pituitary gland suprasellar region normal. Midline structures intact. Vascular: Major intracranial vascular flow voids are maintained. Skull and upper cervical spine: Craniocervical junction within normal limits.  Bone marrow signal intensity normal. No scalp soft tissue abnormality. Sinuses/Orbits: Globes and orbital soft tissues demonstrate no acute finding. Paranasal sinuses are clear. Small bilateral mastoid effusions noted, of doubtful significance. Visualized nasopharynx within normal limits. Other: None. MRA HEAD FINDINGS Examination moderately degraded by motion artifact. ANTERIOR CIRCULATION: Both internal carotid arteries patent to the termini without stenosis or other abnormality. A1 segments patent bilaterally. Normal anterior communicating artery complex. Short-segment mild proximal left A2 stenosis noted. ACAs otherwise patent to their distal aspects without stenosis. No M1 stenosis or occlusion. Normal MCA  bifurcations. Distal MCA branches well perfused and symmetric. POSTERIOR CIRCULATION: Visualized vertebral arteries widely patent to the vertebrobasilar junction without stenosis. Both picas patent proximally. Basilar patent to its distal aspect without stenosis. Superior cerebral arteries patent bilaterally. Both PCAs primarily supplied via the basilar well perfused or distal aspects. No intracranial aneurysm. IMPRESSION: MRI HEAD IMPRESSION: 1. Patchy multifocal cortical and subcortical T2/FLAIR signal abnormality involving the bilateral parietal and occipital regions, left slightly worse than right, most consistent with PRES. No associated hemorrhage or mass effect. 2. Underlying age-related cerebral atrophy. No acute intracranial infarct or other abnormality. MRA HEAD IMPRESSION: Negative intracranial MRA. No large vessel occlusion, hemodynamically significant stenosis, or other acute vascular abnormality. No aneurysm. Electronically Signed   By: Jeannine Boga M.D.   On: 06/10/2020 06:57   CT Code Stroke Cerebral Perfusion with contrast  Result Date: 06/10/2020 CLINICAL DATA:  Initial evaluation for acute neuro deficit, stroke suspected, right-sided weakness, visual loss. EXAM: CT ANGIOGRAPHY  HEAD AND NECK CT PERFUSION BRAIN TECHNIQUE: Multidetector CT imaging of the head and neck was performed using the standard protocol during bolus administration of intravenous contrast. Multiplanar CT image reconstructions and MIPs were obtained to evaluate the vascular anatomy. Carotid stenosis measurements (when applicable) are obtained utilizing NASCET criteria, using the distal internal carotid diameter as the denominator. Multiphase CT imaging of the brain was performed following IV bolus contrast injection. Subsequent parametric perfusion maps were calculated using RAPID software. CONTRAST:  171mL OMNIPAQUE IOHEXOL 350 MG/ML SOLN COMPARISON:  Prior head CT from earlier the same day. FINDINGS: CTA NECK FINDINGS Aortic arch: Visualized aortic arch of normal caliber with normal 3 vessel morphology. Moderate atheromatous change about the arch and origin of the great vessels without hemodynamically significant stenosis. Right carotid system: Right CCA patent from its origin to the bifurcation without stenosis. Scattered calcified plaque about the right bifurcation/proximal right ICA without significant stenosis. Additional scattered eccentric calcified plaque within the distal right ICA just prior to the skull base with no more than mild stenosis of up to 35% by NASCET criteria. No dissection or other acute vascular abnormality. Left carotid system: Left CCA patent from its origin to the bifurcation without stenosis. Extensive bulky calcified plaque about the left bifurcation/proximal left ICA with associated stenosis of up to approximately 70% by NASCET criteria. Additional scattered eccentric calcified plaque within the distal cervical left ICA just prior to the skull base without hemodynamically significant stenosis. No dissection or other acute vascular abnormality. Vertebral arteries: Both vertebral arteries arise from the subclavian arteries. No hemodynamically significant proximal subclavian artery stenosis.  Atheromatous change at the origin of the right vertebral artery with no more than mild stenosis. Proximal vertebral arteries not well assessed due to motion, but grossly patent without abnormality. Visualized portions of the vertebral arteries patent without dissection, stenosis, or occlusion. Skeleton: No acute osseous abnormality. No discrete or worrisome osseous lesions. Patient is edentulous. Other neck: No other acute soft tissue abnormality within the neck. No mass lesion or adenopathy. Upper chest: Visualized upper chest demonstrates no acute finding. Scattered atelectatic changes noted within the visualized lungs. Review of the MIP images confirms the above findings CTA HEAD FINDINGS Anterior circulation: Petrous segments patent bilaterally. Scattered atheromatous plaque throughout the cavernous/supraclinoid ICAs with associated mild to moderate multifocal narrowing. ICA termini well perfused. A1 segments patent bilaterally. Normal anterior communicating artery complex. Anterior cerebral arteries patent to their distal aspects without stenosis. No M1 stenosis or occlusion. Normal MCA bifurcations. Distal MCA branches well perfused and symmetric.  Posterior circulation: Mild nonstenotic plaque noted within the V4 segments bilaterally. Both vertebral arteries patent to the vertebrobasilar junction without stenosis. Both picas patent. Basilar widely patent to its distal aspect without stenosis. Superior cerebral arteries patent bilaterally. Both PCAs primarily supplied via the basilar well perfused or distal aspects. Venous sinuses: Patent. Anatomic variants: None significant.  No intracranial aneurysm. Review of the MIP images confirms the above findings CT Brain Perfusion Findings: ASPECTS: 10 CBF (<30%) Volume: 74mL Perfusion (Tmax>6.0s) volume: 67mL Mismatch Volume: 80mL Infarction Location:Negative CT perfusion with no evidence for acute core infarct or other perfusion deficit. IMPRESSION: CTA HEAD AND NECK  IMPRESSION: 1. Negative CTA for emergent large vessel occlusion. 2. 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 3. Additional moderate intracranial atheromatous change elsewhere about the major arterial vasculature of the head and neck. No other proximal high-grade or correctable stenosis. CT PERFUSION IMPRESSION: Negative CT perfusion. No evidence for acute core infarct or other perfusion deficit. Critical Value/emergent results were called by telephone at the time of interpretation on 06/10/2020 at 1:17 am to provider ERIC [facility] , who verbally acknowledged these results. Electronically Signed   By: Jeannine Boga M.D.   On: 06/10/2020 01:46   EEG adult  Result Date: 06/10/2020 Lora Havens, MD     06/10/2020 10:25 PM Patient Name: Korion Cuevas MRN: [id number] Epilepsy Attending: Lora Havens Referring Physician/Provider: Dr Kerney Elbe Date: 06/10/2020 Duration: 23.38 mins Patient history: 69 y.o. male presenting with acute onset of visual loss and mutism. EEG to evaluate for seizure. Level of alertness:  Awake, asleep AEDs during EEG study: None Technical aspects: This EEG study was done with scalp electrodes positioned according to the 10-20 International system of electrode placement. Electrical activity was acquired at a sampling rate of 500Hz  and reviewed with a high frequency filter of 70Hz  and a low frequency filter of 1Hz . EEG data were recorded continuously and digitally stored. Description: No posterior dominant rhythm was seen. Sleep was characterized by vertex waves, sleep spindles (12-14hz ), maximal frontocentral region. EEG showed continuous generalized 3 to 6 Hz theta-delta slowing. Physiologic photic driving was not seen during photic stimulation.  Hyperventilation was not performed.  EKG artifact was also noted.  ABNORMALITY -Continuous slow, generalized IMPRESSION: This study is suggestive of moderate diffuse encephalopathy, nonspecific  etiology. No seizures or epileptiform discharges were seen throughout the recording. Lora Havens   ECHOCARDIOGRAM COMPLETE BUBBLE STUDY  Result Date: 06/10/2020    ECHOCARDIOGRAM REPORT   Patient Name:   RHODES CALVERT Date of Exam: 06/10/2020 Medical Rec #:  [id number]    Height:       69.0 in Accession #:    [id number]   Weight:       329.6 lb Date of Birth:  [birth year]     BSA:          2.555 m Patient Age:    31 years     BP:           149/95 mmHg Patient Gender: M            HR:           56 bpm. Exam Location:  Inpatient Procedure: 2D Echo, Color Doppler, Cardiac Doppler and Intracardiac            Opacification Agent Indications:    TIA  History:        Patient has prior history of Echocardiogram examinations, most  recent 10/27/2018. CAD, COPD; Risk Factors:Hypertension, Diabetes                 and Dyslipidemia.  Sonographer:    Raquel Sarna Senior RDCS Referring Phys: [id number] Kayleen Memos  Sonographer Comments: Very technically difficult study, poor echo windows due to patient body habitus. Image quality not sufficient to perform bubble study. IMPRESSIONS  1. Left ventricular ejection fraction, by estimation, is 60 to 65%. The left ventricle has normal function. The left ventricle has no regional wall motion abnormalities. Left ventricular diastolic parameters are consistent with Grade I diastolic dysfunction (impaired relaxation).  2. Right ventricular systolic function is normal. The right ventricular size is normal.  3. Left atrial size was moderately dilated.  4. The mitral valve is normal in structure. Trivial mitral valve regurgitation. No evidence of mitral stenosis.  5. The aortic valve is normal in structure. Aortic valve regurgitation is not visualized. No aortic stenosis is present.  6. The inferior vena cava is normal in size with greater than 50% respiratory variability, suggesting right atrial pressure of 3 mmHg. FINDINGS  Left Ventricle: Left ventricular ejection fraction, by  estimation, is 60 to 65%. The left ventricle has normal function. The left ventricle has no regional wall motion abnormalities. Definity contrast agent was given IV to delineate the left ventricular  endocardial borders. The left ventricular internal cavity size was normal in size. There is no left ventricular hypertrophy. Left ventricular diastolic parameters are consistent with Grade I diastolic dysfunction (impaired relaxation). Normal left ventricular filling pressure. Right Ventricle: The right ventricular size is normal. No increase in right ventricular wall thickness. Right ventricular systolic function is normal. Left Atrium: Left atrial size was moderately dilated. Right Atrium: Right atrial size was normal in size. Pericardium: There is no evidence of pericardial effusion. Mitral Valve: The mitral valve is normal in structure. Trivial mitral valve regurgitation. No evidence of mitral valve stenosis. Tricuspid Valve: The tricuspid valve is normal in structure. Tricuspid valve regurgitation is not demonstrated. No evidence of tricuspid stenosis. Aortic Valve: The aortic valve is normal in structure. Aortic valve regurgitation is not visualized. No aortic stenosis is present. Pulmonic Valve: The pulmonic valve was normal in structure. Pulmonic valve regurgitation is not visualized. No evidence of pulmonic stenosis. Aorta: The aortic root is normal in size and structure. Venous: The inferior vena cava is normal in size with greater than 50% respiratory variability, suggesting right atrial pressure of 3 mmHg. IAS/Shunts: No atrial level shunt detected by color flow Doppler.   Diastology LV e' medial:    7.40 cm/s LV E/e' medial:  7.6 LV e' lateral:   5.55 cm/s LV E/e' lateral: 10.2  LEFT ATRIUM           Index LA Vol (A4C): 90.1 ml 35.27 ml/m  AORTIC VALVE LVOT Vmax:   104.00 cm/s LVOT Vmean:  69.700 cm/s LVOT VTI:    0.213 m MITRAL VALVE MV Area (PHT): 2.39 cm    SHUNTS MV Decel Time: 317 msec    Systemic  VTI: 0.21 m MV E velocity: 56.60 cm/s MV A velocity: 84.80 cm/s MV E/A ratio:  0.67 Skeet Latch MD Electronically signed by Skeet Latch MD Signature Date/Time: 06/10/2020/12:17:21 PM    Final    CT HEAD CODE STROKE WO CONTRAST  Result Date: 06/10/2020 CLINICAL DATA:  Code stroke. Initial evaluation for acute right-sided weakness. EXAM: CT HEAD WITHOUT CONTRAST TECHNIQUE: Contiguous axial images were obtained from the base of the skull through the vertex  without intravenous contrast. COMPARISON:  None available. FINDINGS: Brain: Age-related cerebral atrophy. No acute intracranial hemorrhage. No acute large vessel territory infarct. No mass lesion, midline shift or mass effect. No hydrocephalus or extra-axial fluid collection. Vascular: No hyperdense vessel. Calcified atherosclerosis present at the skull base. Skull: Scalp soft tissues and calvarium within normal limits. Sinuses/Orbits: Mild left gaze noted. Globes and orbital soft tissues demonstrate no other acute finding. Mild scattered mucosal thickening noted within the ethmoidal air cells. Paranasal sinuses are otherwise clear. Small bilateral mastoid effusions noted, of doubtful significance. Other: None. ASPECTS [facility] Stroke Program Early CT Score) - Ganglionic level infarction (caudate, lentiform nuclei, internal capsule, insula, M1-M3 cortex): 7 - Supraganglionic infarction (M4-M6 cortex): 3 Total score (0-10 with 10 being normal): 10 IMPRESSION: 1. No acute intracranial infarct or other abnormality. 2. ASPECTS is 10. These results were communicated to Dr. Cheral Marker at 12:49 amon 9/25/2021by text page via the [facility] messaging system. Electronically Signed   By: Jeannine Boga M.D.   On: 06/10/2020 00:50   VAS US CAROTID  Result Date: 06/10/2020 Carotid Arterial Duplex Study Indications:       >70% left ICA stenosis by CT. Speech disturbance and Altered                    mental status. Risk Factors:      Hypertension, hyperlipidemia,  Diabetes, coronary artery                    disease. Other Factors:     COPD, on Home O2. Limitations        Today's exam was limited due to the body habitus of the                    patient and acoustic shadowing. Comparison Study:  No prior study Performing Technologist: Sharion Dove RVS Supporting Technologist: Darlin Coco  Examination Guidelines: A complete evaluation includes B-mode imaging, spectral Doppler, color Doppler, and power Doppler as needed of all accessible portions of each vessel. Bilateral testing is considered an integral part of a complete examination. Limited examinations for reoccurring indications may be performed as noted.  Right Carotid Findings: +----------+--------+--------+--------+------------------+------------------+           PSV cm/sEDV cm/sStenosisPlaque DescriptionComments           +----------+--------+--------+--------+------------------+------------------+ CCA Prox  115     10                                intimal thickening +----------+--------+--------+--------+------------------+------------------+ CCA Distal100     17                                intimal thickening +----------+--------+--------+--------+------------------+------------------+ ICA Prox  82      12      1-39%   calcific                             +----------+--------+--------+--------+------------------+------------------+ ICA Distal100     21                                                   +----------+--------+--------+--------+------------------+------------------+ ECA       120  10                                                   +----------+--------+--------+--------+------------------+------------------+ +----------+--------+-------+--------+-------------------+           PSV cm/sEDV cmsDescribeArm Pressure (mmHG) +----------+--------+-------+--------+-------------------+ [id number]                                         +----------+--------+-------+--------+-------------------+ +---------+--------+--+--------+-+ VertebralPSV cm/s56EDV cm/s5 +---------+--------+--+--------+-+  Left Carotid Findings: +----------+--------+--------+--------+------------------+------------------+           PSV cm/sEDV cm/sStenosisPlaque DescriptionComments           +----------+--------+--------+--------+------------------+------------------+ CCA Prox  140     33                                intimal thickening +----------+--------+--------+--------+------------------+------------------+ CCA Distal91      22              calcific                             +----------+--------+--------+--------+------------------+------------------+ ICA Prox  270     47      40-59%  calcific          Shadowing          +----------+--------+--------+--------+------------------+------------------+ ICA Mid                           calcific          Shadowing          +----------+--------+--------+--------+------------------+------------------+ ICA Distal                                          Not visualized     +----------+--------+--------+--------+------------------+------------------+ ECA       269     21                                                   +----------+--------+--------+--------+------------------+------------------+ +----------+--------+--------+--------+-------------------+           PSV cm/sEDV cm/sDescribeArm Pressure (mmHG) +----------+--------+--------+--------+-------------------+ [id number]                                         +----------+--------+--------+--------+-------------------+ +---------+--------+---+--------+--+ VertebralPSV cm/s123EDV cm/s12 +---------+--------+---+--------+--+   Summary: Right Carotid: Velocities in the right ICA are consistent with a 1-39% stenosis. Left Carotid: Velocities in the left ICA are consistent with a 40-59% stenosis.                Higher velocities may be obscured by calcific plaque and acoustic               shadowing.  *See table(s) above for measurements and observations.     Preliminary         Scheduled Meds: . Chlorhexidine Gluconate Cloth  6 each Topical Daily  . cholecalciferol  1,000 Units Oral Daily  . insulin aspart  0-9 Units Subcutaneous Q4H  . metoprolol succinate  25 mg Oral Daily  . pravastatin  20 mg Oral Daily  . tamsulosin  0.4 mg Oral QHS   Continuous Infusions:        Aline August, MD [facility] 06/11/2020, 8:29 AM

## 2020-06-11 NOTE — Care Management Obs Status (Signed)
[place] NOTIFICATION   Patient Details  Name: Belmont Valli MRN: [id number] Date of Birth: Aug 24, 1951   Medicare Observation Status Notification Given:  Yes    Carles Collet, RN 06/11/2020, 10:50 AM

## 2020-06-11 NOTE — Progress Notes (Addendum)
Neurology Progress Note   S:// Seen and examined. Continues to report vision changes-says when he looks at one object and then looks at the other when he sees the reflection of the first object even when he is not staring at that object. No headaches.  No chest pain or shortness of breath.  No seizure activity reported.   O:// Current vital signs: BP (!) 180/52 (BP Location: Right Arm)   Pulse 66   Temp 98.4 F (36.9 C) (Oral)   Resp 17   Ht 5\' 9"  (1.753 m)   Wt (!) 150.5 kg   SpO2 96%   BMI 49.00 kg/m  Vital signs in last 24 hours: Temp:  [97.7 F (36.5 C)-98.4 F (36.9 C)] 98.4 F (36.9 C) (09/26 0723) Pulse Rate:  [57-66] 66 (09/26 0723) Resp:  [13-20] 17 (09/26 0415) BP: (125-180)/(50-84) 180/52 (09/26 0723) SpO2:  [95 %-98 %] 96 % (09/26 0723) Weight:  [150.5 kg] 150.5 kg (09/25 1841) Neurological exam Hooked up to LTM EEG Awake alert oriented x3 Mildly poor attention concentration No aphasia No dysarthria Cranial nerves II to XII intact Motor exam: Antigravity in all fours without asterixis Sensory exam: Intact to touch all over Coordination: No dysmetria  General: Obese man sitting in bed comfortably no distress HEENT: Normocephalic, atraumatic, dry mucous membranes CVS: Regular rate rhythm Respiratory: Breathing well saturating normally on room air. Extremities warm well perfused with trace edema in bilateral lower extremities.  Medications  Current Facility-Administered Medications:  .  albuterol (VENTOLIN HFA) 108 (90 Base) MCG/ACT inhaler 2 puff, 2 puff, Inhalation, Q6H PRN, Hall, Carole N, DO .  Chlorhexidine Gluconate Cloth 2 % PADS 6 each, 6 each, Topical, Daily, Alekh, Kshitiz, MD .  cholecalciferol (VITAMIN D3) tablet 1,000 Units, 1,000 Units, Oral, Daily, Irene Pap N, DO, 1,000 Units at 06/10/20 1007 .  hydrALAZINE (APRESOLINE) injection 5 mg, 5 mg, Intravenous, Q4H PRN, Opyd, Timothy S, MD .  insulin aspart (novoLOG) injection 0-9 Units, 0-9  Units, Subcutaneous, Q4H, Kayleen Memos, DO, 1 Units at 06/11/20 0426 .  metoprolol succinate (TOPROL-XL) 24 hr tablet 25 mg, 25 mg, Oral, Daily, Hall, Carole N, DO .  pravastatin (PRAVACHOL) tablet 20 mg, 20 mg, Oral, Daily, Hall, Carole N, DO, 20 mg at 06/10/20 1008 .  tamsulosin (FLOMAX) capsule 0.4 mg, 0.4 mg, Oral, QHS, Hall, Carole N, DO, 0.4 mg at 06/10/20 2304 Labs CBC    Component Value Date/Time   WBC 7.0 06/10/2020 0048   RBC 4.74 06/10/2020 0048   HGB 14.3 06/10/2020 0049   HCT 42.0 06/10/2020 0049   PLT 164 06/10/2020 0048   MCV 95.1 06/10/2020 0048   MCH 30.8 06/10/2020 0048   MCHC 32.4 06/10/2020 0048   RDW 14.2 06/10/2020 0048   LYMPHSABS 2.2 06/10/2020 0048   MONOABS 0.5 06/10/2020 0048   EOSABS 0.1 06/10/2020 0048   BASOSABS 0.0 06/10/2020 0048    CMP     Component Value Date/Time   NA 145 06/10/2020 0049   K 3.7 06/10/2020 0049   CL 104 06/10/2020 0049   CO2 27 06/10/2020 0048   GLUCOSE 168 (H) 06/10/2020 0049   BUN 15 06/10/2020 0049   CREATININE 1.10 06/10/2020 0049   CREATININE 1.16 01/13/2015 0936   CALCIUM 8.8 (L) 06/10/2020 0048   PROT 6.2 (L) 06/10/2020 0048   ALBUMIN 3.6 06/10/2020 0048   AST 22 06/10/2020 0048   ALT 38 06/10/2020 0048   ALKPHOS 63 06/10/2020 0048   BILITOT 0.7 06/10/2020  0048   GFRNONAA >60 06/10/2020 0048   GFRNONAA 53 (L) 07/19/2014 1156   GFRAA >60 06/10/2020 0048   GFRAA 61 07/19/2014 1156   Spot EEG negative for acute process or epileptogenicity.   Imaging I have reviewed images in epic and the results pertinent to this consultation are: MRI brain with changes suggestive of posterior reversible encephalopathy syndrome  Assessment: 69 year old man presented with acute onset of visual loss and mutism.  Exam consistent with encephalopathy at the outset.  Speech changes rather quickly reversed.  CT head and CT unremarkable.  MRI brain suggestive of posterior reversible encephalopathy syndrome-likely secondary to  uncontrolled blood pressures Of note, the changes in his vision that he describes are consistent with palinopsia.  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  Recommendations: Continue to work on normalizing blood pressures by about 20% every day. Goal blood pressure upon discharge should be less than 140/90. No need for antiepileptics Was hooked up to LTM EEG-I do not suspect that there will be any acute findings as the exam looks good. I will discontinue the LTM EEG. Follow-up of the formal report recommended. Follow-up with ophthalmology outpatient Communicated with Dr. Starla Link Neurology will be available as needed.  Please call with questions.  -- Amie Portland, MD [facility] Pager: [phone] If 7pm to 7am, please call on call as listed on AMION.

## 2020-06-11 NOTE — Progress Notes (Signed)
LTM EEG: Nonspecific encephalopathy. No further recommendations-see today's progress note from earlier for final set of recommendations. Please call neurology with questions as needed.  -- Amie Portland, MD [facility] Pager: [phone] If 7pm to 7am, please call on call as listed on AMION.

## 2020-06-12 DIAGNOSIS — J9611 Chronic respiratory failure with hypoxia: Secondary | ICD-10-CM

## 2020-06-12 LAB — GLUCOSE, CAPILLARY
Glucose-Capillary: 120 mg/dL — ABNORMAL HIGH (ref 70–99)
Glucose-Capillary: 132 mg/dL — ABNORMAL HIGH (ref 70–99)
Glucose-Capillary: 159 mg/dL — ABNORMAL HIGH (ref 70–99)
Glucose-Capillary: 170 mg/dL — ABNORMAL HIGH (ref 70–99)
Glucose-Capillary: 134 mg/dL — ABNORMAL HIGH (ref 70–99)
Glucose-Capillary: 145 mg/dL — ABNORMAL HIGH (ref 70–99)
Glucose-Capillary: 104 mg/dL — ABNORMAL HIGH (ref 70–99)

## 2020-06-12 NOTE — Progress Notes (Signed)
Occupational Therapy Treatment Patient Details Name: Alfred Carney MRN: [id number] DOB: Dec 22, 1950 Today's Date: 06/12/2020    History of present illness The pt is a 69 yo male presenting with AMS, headaches, weakness, and worsening command following. Code stroke was activated upon arrival, MRI showed "signalabnormality involving the bilateral parietal and occipital regions,left slightly worse than right, most consistent with PRES." Pt describes vision changes that usually last 20 minutes (loses central vision, then "fog rolls in," sometimes looks like broken glass with bubbles in the glass). PMH includes: COPD, CAD, DM II, GERD, HLD, HTN, and morbid obesity.   OT comments  Pt demonstrates improved vision compared to previous session and reports by patient. Pt reports deficits supine in bed but reports same visual object normal in chair sitting. Pt demonstrates cognitive deficits and poor return demo of 2L [place] use. Recommend CIR for d/c planning.    Follow Up Recommendations  CIR    Equipment Recommendations  Other (comment) (RW)    Recommendations for Other Services      Precautions / Restrictions Precautions Precautions: Fall Precaution Comments: watch BP       Mobility Bed Mobility Overal bed mobility: Needs Assistance Bed Mobility: Supine to Sit     Supine to sit: Min assist     General bed mobility comments: heavy use of bed rail to progress  Transfers Overall transfer level: Needs assistance Equipment used: Rolling walker (2 wheeled) Transfers: Sit to/from Stand Sit to Stand: Min assist         General transfer comment: pulling on RW to power up from bed surface    Balance Overall balance assessment: Needs assistance Sitting-balance support: Feet supported Sitting balance-Leahy Scale: Fair Sitting balance - Comments: sitting edge of chair   Standing balance support: During functional activity Standing balance-Leahy Scale: Fair Standing balance comment:  leaning against sink surface               High Level Balance Comments: reports he always turns lights on and does not walk in the dark           ADL either performed or assessed with clinical judgement   ADL Overall ADL's : Needs assistance/impaired     Grooming: Min guard;Standing Grooming Details (indicate cue type and reason): completed oral care, applied glue to dentures, washing face at sink level pt in chair states can i have towel for my hands i forgot to dry them         Upper Body Dressing : Minimal assistance Upper Body Dressing Details (indicate cue type and reason): second gown applicaiton in sitting     Toilet Transfer: Minimal assistance;RW Toilet Transfer Details (indicate cue type and reason): simulated bed to chair         Functional mobility during ADLs: Min guard;Rolling walker General ADL Comments: pt reports baseline bad back. pt standing at sink and removing 2L o2 and cues to place back on face after washing face. pt reports being surprised he needed oxygen but now must have 2 L at home. pt recalls 2L requirement but with poor return demo during adls     Vision   Additional Comments: pt able to complete visual scanning task. pt reports supine that closet looks like it was moving backward so the bottom portion could move up into that place. it appears like the pieces are moving. when sitting in chair reports all things in room appear normal pt drawing a clost the size of a quarter and miss labeling the  hands but verbalizes the error and redrawing. pt asked to place numbers on clock face and makes7 dots instead of 6 to symbolize 1-6 without recognizing error. pt able to dial phone correctly for breakfast order reading the number from menu. pt able to correctly divide lines on paper in central vision in half with perfect accuracy. pt reports "its so weird laying down i have vision changes but not now sitting up"   Perception     Praxis       Cognition Arousal/Alertness: Awake/alert Behavior During Therapy: WFL for tasks assessed/performed Overall Cognitive Status: Impaired/Different from baseline Area of Impairment: Memory;Following commands;Problem solving;Awareness;Safety/judgement                 Orientation Level:  (not tested)   Memory: Decreased short-term memory Following Commands: Follows one step commands consistently Safety/Judgement: Decreased awareness of safety;Decreased awareness of deficits (drives with vision changes; up to bathroom without O2) Awareness: Emergent Problem Solving: Requires verbal cues;Difficulty sequencing General Comments: pt reports first memory is being in hospital from events . pt states my wife told me  as a reference. pt not awareness to current deficits or "why" he is in hospital.         Exercises     Shoulder Instructions       General Comments Daughter present and reports pt not back to baseline (endurance, strength, coginiition). See history re: pt's reports of visual changes/distortions that come and go (cabinets sliding back and up into upper cabinets, window blinds flipping over and back; patterns on the wall)    Pertinent Vitals/ Pain       Pain Assessment: Faces Faces Pain Scale: No hurt  Home Living                                          Prior Functioning/Environment              Frequency  Min 2X/week        Progress Toward Goals  OT Goals(current goals can now be found in the care plan section)     Acute Rehab OT Goals Patient Stated Goal: to be able to go home with wife OT Goal Formulation: With patient Time For Goal Achievement: 06/24/20 Potential to Achieve Goals: Good ADL Goals Pt Will Perform Eating: with supervision;with caregiver independent in assisting Pt Will Perform Upper Body Dressing: with set-up;sitting Pt Will Perform Lower Body Dressing: with min assist;sit to/from stand Pt Will Transfer to Toilet:  with modified independence;ambulating Pt Will Perform Toileting - Clothing Manipulation and hygiene: with supervision;sit to/from stand Additional ADL Goal #1: Pt will perform bed mobility at mod I level prior to engaging in ADL  Plan Discharge plan remains appropriate    Co-evaluation                 AM-PAC OT "6 Clicks" Daily Activity     Outcome Measure   Help from another person eating meals?: A Little Help from another person taking care of personal grooming?: A Little Help from another person toileting, which includes using toliet, bedpan, or urinal?: A Little Help from another person bathing (including washing, rinsing, drying)?: A Little Help from another person to put on and taking off regular upper body clothing?: A Little Help from another person to put on and taking off regular lower body clothing?: A Lot 6 Click Score: 17  End of Session Equipment Utilized During Treatment: Gait belt;Oxygen;Rolling walker  OT Visit Diagnosis: Unsteadiness on feet (R26.81);Other abnormalities of gait and mobility (R26.89);Other symptoms and signs involving cognitive function;Low vision, both eyes (H54.2)   Activity Tolerance Patient tolerated treatment well   Patient Left in chair;with call bell/phone within reach;with chair alarm set   Nurse Communication Mobility status;Precautions        Time: [phone] OT Time Calculation (min): 30 min  Charges: OT General Charges $OT Visit: 1 Visit OT Treatments $Self Care/Home Management : 23-37 mins   Brynn, OTR/L  Acute Rehabilitation Services Pager: [phone] Office: [phone] .    Jeri Modena 06/12/2020, 4:00 PM

## 2020-06-12 NOTE — Progress Notes (Signed)
Physical Therapy Treatment Patient Details Name: Alfred Carney MRN: [id number] DOB: [birth year] Today's Date: 06/12/2020    History of Present Illness The pt is a 69 yo male presenting with AMS, headaches, weakness, and worsening command following. Code stroke was activated upon arrival, MRI showed "signalabnormality involving the bilateral parietal and occipital regions,left slightly worse than right, most consistent with PRES." Pt describes vision changes that usually last 20 minutes (loses central vision, then "fog rolls in," sometimes looks like broken glass with bubbles in the glass). PMH includes: COPD, CAD, DM II, GERD, HLD, HTN, and morbid obesity.    PT Comments    Patient's vision much better this session than during evaluation in ED. He was able to negotiate out of bathroom and around bed to his recliner by the window with no cues to avoid objects. He does report his vision changes continue to "come and go" and last ~20 minutes at a time. He reports his wife helps with his socks and taking the garbage out, but he can manage everything else on his own. He does not feel he is significantly different than his baseline for mobility, endurance, and shortness of breath with activity however his daughter reports he remains worse than his usual. (He did desaturate to 88% walking 25 ft on 2L O2). Patient reports he can normally go up/down 6 steps with rails (back entrance of home) to get to his "yard truck." Will attempt stairs next visit.     Follow Up Recommendations  CIR     Equipment Recommendations  None recommended by PT    Recommendations for Other Services Rehab consult     Precautions / Restrictions Precautions Precautions: Fall Precaution Comments: watch BP    Mobility  Bed Mobility                  Transfers Overall transfer level: Needs assistance Equipment used: Rolling walker (2 wheeled) Transfers: Sit to/from Stand Sit to Stand: Min guard         General  transfer comment: from toilet with grab bar; from recliner with armrests  Ambulation/Gait Ambulation/Gait assistance: +2 safety/equipment;Min guard Gait Distance (Feet): 20 Feet Assistive device: Rolling walker (2 wheeled) Gait Pattern/deviations: Step-through pattern;Decreased stride length;Wide base of support;Trunk flexed Gait velocity: decreased   General Gait Details: in bathroom on arrival; sats 90% on room air; incr to 94% when 2L resumed; dropped to 88% walking 20 ft on 2L with 1 minute of pursed lip breathing to return to 90%   Stairs             Wheelchair Mobility    Modified Rankin (Stroke Patients Only) Modified Rankin (Stroke Patients Only) Pre-Morbid Rankin Score: Moderate disability Modified Rankin: Moderately severe disability     Balance Overall balance assessment: Needs assistance Sitting-balance support: Feet supported Sitting balance-Leahy Scale: Fair Sitting balance - Comments: sitting edge of chair   Standing balance support: During functional activity;No upper extremity supported Standing balance-Leahy Scale: Fair Standing balance comment: also able to maintain balance with 1 finger support on RW with eyes closed x 10 seconds with slight sway               High Level Balance Comments: reports he always turns lights on and does not walk in the dark            Cognition Arousal/Alertness: Awake/alert Behavior During Therapy: [facility] for tasks assessed/performed Overall Cognitive Status: Impaired/Different from baseline Area of Impairment: Memory;Following commands;Problem solving;Awareness;Safety/judgement  Orientation Level:  (not tested)   Memory: Decreased short-term memory Following Commands: Follows one step commands consistently Safety/Judgement: Decreased awareness of safety;Decreased awareness of deficits (drives with vision changes; up to bathroom without O2) Awareness: Intellectual Problem Solving:  Requires verbal cues;Difficulty sequencing General Comments: insistent that this therapist already worked with him this a.m. Eventually agreed he likely saw OT. States he called for assist to bathroom, however when nursing did not arrive, he asked daughter to assist him to bathroom and removed his O2 (uses 2L at home).       Exercises      General Comments General comments (skin integrity, edema, etc.): Daughter present and reports pt not back to baseline (endurance, strength, coginiition). See history re: pt's reports of visual changes/distortions that come and go (cabinets sliding back and up into upper cabinets, window blinds flipping over and back; patterns on the wall)      Pertinent Vitals/Pain Pain Assessment: Faces Faces Pain Scale: No hurt    Home Living                      Prior Function            PT Goals (current goals can now be found in the care plan section) Acute Rehab PT Goals Patient Stated Goal: determine cause of vision change Time For Goal Achievement: 06/24/20 Potential to Achieve Goals: Good Progress towards PT goals: Progressing toward goals    Frequency    Min 4X/week      PT Plan Current plan remains appropriate    Co-evaluation              AM-PAC PT "6 Clicks" Mobility   Outcome Measure  Help needed turning from your back to your side while in a flat bed without using bedrails?: A Little Help needed moving from lying on your back to sitting on the side of a flat bed without using bedrails?: A Lot Help needed moving to and from a bed to a chair (including a wheelchair)?: A Little Help needed standing up from a chair using your arms (e.g., wheelchair or bedside chair)?: A Little Help needed to walk in hospital room?: A Little Help needed climbing 3-5 steps with a railing? : A Lot 6 Click Score: 16    End of Session Equipment Utilized During Treatment: Oxygen (2L O2 (pt's baseline)) Activity Tolerance: Patient limited by  fatigue Patient left: in chair;with call bell/phone within reach;with family/visitor present   PT Visit Diagnosis: Difficulty in walking, not elsewhere classified (R26.2)     Time: 3875-6433 PT Time Calculation (min) (ACUTE ONLY): 34 min  Charges:  $Gait Training: 8-22 mins $Self Care/Home Management: 8-22                      Arby Barrette, PT Pager [phone]    Rexanne Mano 06/12/2020, 2:58 PM

## 2020-06-12 NOTE — Progress Notes (Signed)
Inpatient Rehab Admissions Coordinator:  Saw pt and daughter, Tessie Fass at bedside. Pt remains undecided about CIR.  Explained CIR goals and expectations to daughter.  She acknowledged understanding.  Pt would like AC to follow up at later date to inform about his decision.   Gayland Curry, [place], [place] Admissions Coordinator [phone]

## 2020-06-12 NOTE — Progress Notes (Signed)
Patient ID: Alfred Carney, male   DOB: [birth year], 69 y.o.   MRN: [id number]  PROGRESS NOTE    Alfred Carney  TZG:[id number] DOB: [birth year] DOA: 06/10/2020 PCP: Lesleigh Noe, MD   Brief Narrative:  69 year old male with history of COPD, coronary artery disease, diabetes mellitus type 2, GERD, fatty liver, hyperlipidemia, essential hypertension, morbid obesity, chronic hypoxia on 2 L oxygen at home, thoracoabdominal aortic aneurysm status post repair, OSA presented with difficulty speaking followed by being nonverbal along with vision loss.  On presentation, code stroke was called.  CT of the head was negative for acute intracranial abnormality.  Neurology was consulted.  CTA of the head and neck was negative for large vessel occlusion.  CT perfusion was negative for acute infarct or perfusion defect.  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  - CT of the head was negative for acute intracranial abnormality.  Neurology was consulted.   -CTA of the head and neck was negative for large vessel occlusion.  CT perfusion was negative for acute infarct or perfusion defect. -MRI of the brain showed findings most consistent with press with no acute intracranial infarct or other abnormality.  MRA of the head was negative for large vessel occlusion. -LTM EEG was negative for any seizure activities. -PT/OT recommended CIR placement.  CIR has been consulted.   -Echo showed EF of 60 to 65% with grade 1 diastolic dysfunction  -carotid ultrasound showed right ICA 1 to 39% stenosis and left ICA 40 to 59% stenosis -Continue neurochecks.  Fall precautions. -Diet as per SLP recommendations. -Neurology recommended strict blood pressure control and has signed off  Diabetes mellitus type 2 with hyperglycemia -A1c 6.9.  Continue CBGs with  SSI.  Hypertension-blood pressure on the higher side.  Monitor.  Continue metoprolol, Lasix and irbesartan.  Goal blood pressure on discharge 140/90 as per neurology recommendations  Morbid obesity -Outpatient follow-up  BPH -Continue tamsulosin  Hyperlipidemia -continue statin  Chronic hypoxic respiratory failure -Uses nasal cannula oxygen at 2L/min at home.  Continue oxygen supplementation.  Respiratory status stable.   DVT prophylaxis:  Lovenox Code Status: Full Family Communication: Son on phone on 06/11/2020 Disposition Plan: Status is: Inpatient  Inpatient level of care appropriate due to severity of illness.  PT/OT recommended CIR placement.  Pending CIR placement  Dispo:  Patient From: Home  Planned Disposition: Inpatient Rehab  Expected discharge date: 06/12/2020  Medically stable for discharge: Yes   Consultants: Neurology  Procedures: 2D echo/EEG/carotid ultrasound  Antimicrobials: None   Subjective: Patient seen and examined at bedside.  Still complains of some blurred vision.  Denies headache, nausea or vomiting.  No overnight fevers. Speech has much improved. Objective: Vitals:   06/11/20 1233 06/11/20 2031 06/11/20 2355 06/12/20 0330  BP: (!) 159/57 (!) 155/53 (!) 170/69 (!) 160/57  Pulse: 72 (!) 58 (!) 58 (!) 57  Resp:  18 18 18   Temp: 97.9 F (36.6 C) 98 F (36.7 C) 98.2 F (36.8 C) 97.9 F (36.6 C)  TempSrc: Oral Oral Oral Oral  SpO2: 97% 96% 97% 98%  Weight:      Height:        Intake/Output Summary (Last 24 hours) at 06/12/2020 7591 Last data filed at 06/11/2020 1200 Gross per 24 hour  Intake 600 ml  Output 200 ml  Net 400 ml   [employer]  06/10/20 1841  Weight: (!) 150.5 kg    Examination:  General exam: No acute distress.  Chronically ill looking.  Currently on 2 L oxygen via nasal cannula.  Poor historian.  Speech is improving and more comprehensible. Respiratory system: Bilateral decreased breath sounds at bases with no  wheezing cardiovascular system: Rate controlled, S1-S2 heard Gastrointestinal system: Abdomen is morbidly obese, nondistended, soft and nontender.  Bowel sounds are heard. Extremities: Mild lower extremity edema.  No clubbing   Data Reviewed: I have personally reviewed following labs and imaging studies  CBC: Recent Labs  Lab 06/10/20 0048 06/10/20 0049 06/11/20 0754  WBC 7.0  --  6.0  NEUTROABS 4.2  --  3.4  HGB 14.6 14.3 13.1  HCT 45.1 42.0 40.7  MCV 95.1  --  92.7  PLT 164  --  485   Basic Metabolic Panel: Recent Labs  Lab 06/10/20 0048 06/10/20 0049 06/11/20 0754  NA 143 145 142  K 4.0 3.7 3.6  CL 106 104 101  CO2 27  --  28  GLUCOSE 182* 168* 105*  BUN 14 15 11   CREATININE 1.18 1.10 1.19  CALCIUM 8.8*  --  8.7*  MG  --   --  1.6*   GFR: Estimated Creatinine Clearance: 83.8 mL/min (by C-G formula based on SCr of 1.19 mg/dL). Liver Function Tests: Recent Labs  Lab 06/10/20 0048 06/11/20 0754  AST 22 23  ALT 38 32  ALKPHOS 63 51  BILITOT 0.7 1.0  PROT 6.2* 5.5*  ALBUMIN 3.6 3.2*   No results for input(s): LIPASE, AMYLASE in the last 168 hours. No results for input(s): AMMONIA in the last 168 hours. Coagulation Profile: Recent Labs  Lab 06/10/20 0048  INR 1.1   Cardiac Enzymes: No results for input(s): CKTOTAL, CKMB, CKMBINDEX, TROPONINI in the last 168 hours. BNP (last 3 results) No results for input(s): PROBNP in the last 8760 hours. HbA1C: Recent Labs    06/10/20 1220  HGBA1C 6.9*   CBG: Recent Labs  Lab 06/11/20 1229 06/11/20 1627 06/11/20 2028 06/11/20 2351 06/12/20 0324  GLUCAP 160* 144* 180* 115* 120*   Lipid Profile: Recent Labs    06/10/20 0048  CHOL 147  HDL 36*  LDLCALC 90  TRIG 107  CHOLHDL 4.1   Thyroid Function Tests: No results for input(s): TSH, T4TOTAL, FREET4, T3FREE, THYROIDAB in the last 72 hours. Anemia Panel: No results for input(s): VITAMINB12, FOLATE, FERRITIN, TIBC, IRON, RETICCTPCT in the last 72  hours. Sepsis Labs: No results for input(s): PROCALCITON, LATICACIDVEN in the last 168 hours.  Recent Results (from the past 240 hour(s))  Respiratory Panel by RT PCR (Flu A&B, Covid) - Nasopharyngeal Swab     Status: None   Collection Time: 06/10/20  3:23 AM   Specimen: Nasopharyngeal Swab  Result Value Ref Range Status   SARS Coronavirus 2 by RT PCR NEGATIVE NEGATIVE Final    Comment: (NOTE) SARS-CoV-2 target nucleic acids are NOT DETECTED.  The SARS-CoV-2 RNA is generally detectable in upper respiratoy specimens during the acute phase of infection. The lowest concentration of SARS-CoV-2 viral copies this assay can detect is 131 copies/mL. A negative result does not preclude SARS-Cov-2 infection and should not be used as the sole basis for treatment or other patient management decisions. A negative result may occur with  improper specimen collection/handling, submission of specimen other than nasopharyngeal swab, presence of viral mutation(s) within the areas targeted by this assay, and inadequate number of viral copies (<131 copies/mL). A  negative result must be combined with clinical observations, patient history, and epidemiological information. The expected result is Negative.  Fact Sheet for Patients:  PinkCheek.be  Fact Sheet for Healthcare Providers:  GravelBags.it  This test is no t yet approved or cleared by the [place] FDA and  has been authorized for detection and/or diagnosis of SARS-CoV-2 by FDA under an Emergency Use Authorization (EUA). This EUA will remain  in effect (meaning this test can be used) for the duration of the COVID-19 declaration under Section 564(b)(1) of the Act, 21 U.S.C. section 360bbb-3(b)(1), unless the authorization is terminated or revoked sooner.     Influenza A by PCR NEGATIVE NEGATIVE Final   Influenza B by PCR NEGATIVE NEGATIVE Final    Comment: (NOTE) The Xpert Xpress  SARS-CoV-2/FLU/RSV assay is intended as an aid in  the diagnosis of influenza from Nasopharyngeal swab specimens and  should not be used as a sole basis for treatment. Nasal washings and  aspirates are unacceptable for Xpert Xpress SARS-CoV-2/FLU/RSV  testing.  Fact Sheet for Patients: PinkCheek.be  Fact Sheet for Healthcare Providers: GravelBags.it  This test is not yet approved or cleared by the [place] FDA and  has been authorized for detection and/or diagnosis of SARS-CoV-2 by  FDA under an Emergency Use Authorization (EUA). This EUA will remain  in effect (meaning this test can be used) for the duration of the  Covid-19 declaration under Section 564(b)(1) of the Act, 21  U.S.C. section 360bbb-3(b)(1), unless the authorization is  terminated or revoked. Performed at [facility], [place] [address] [place], [place] [address]          Radiology Studies: EEG adult  Result Date: 06/28/2020 Lora Havens, MD     2020/06/28 10:25 PM Patient Name: Alfred Carney MRN: [id number] Epilepsy Attending: Lora Havens Referring Physician/Provider: Dr Kerney Elbe Date: Jun 28, 2020 Duration: 23.38 mins Patient history: 69 y.o. male presenting with acute onset of visual loss and mutism. EEG to evaluate for seizure. Level of alertness:  Awake, asleep AEDs during EEG study: None Technical aspects: This EEG study was done with scalp electrodes positioned according to the 10-20 International system of electrode placement. Electrical activity was acquired at a sampling rate of 500Hz  and reviewed with a high frequency filter of 70Hz  and a low frequency filter of 1Hz . EEG data were recorded continuously and digitally stored. Description: No posterior dominant rhythm was seen. Sleep was characterized by vertex waves, sleep spindles (12-14hz ), maximal frontocentral region. EEG showed continuous generalized 3 to 6 Hz theta-delta slowing.  Physiologic photic driving was not seen during photic stimulation.  Hyperventilation was not performed.  EKG artifact was also noted.  ABNORMALITY -Continuous slow, generalized IMPRESSION: This study is suggestive of moderate diffuse encephalopathy, nonspecific etiology. No seizures or epileptiform discharges were seen throughout the recording. Priyanka Barbra Sarks   Overnight EEG with video  Result Date: 06/11/2020 Lora Havens, MD     06/11/2020  2:08 PM Patient Name: Alfred Carney MRN: [id number] Epilepsy Attending: Lora Havens Referring Physician/Provider: Dr Kerney Elbe Duration: 2020-06-28 2145 to 06/11/2020 1211  Patient history: 69 y.o.malepresenting with acute onset of visual loss and mutism. EEG to evaluate for seizure.  Level of alertness:  Awake, asleep  AEDs during EEG study: None  Technical aspects: This EEG study was done with scalp electrodes positioned according to the 10-20 International system of electrode placement. Electrical activity was acquired at a sampling rate of 500Hz  and reviewed with a high frequency  filter of 70Hz  and a low frequency filter of 1Hz . EEG data were recorded continuously and digitally stored.  Description: No posterior dominant rhythm was seen. Sleep was characterized by vertex waves, sleep spindles (12-14hz ), maximal frontocentral region. EEG showed continuous generalized 3 to 6 Hz theta-delta slowing. Physiologic photic driving was not seen during photic stimulation.  Hyperventilation was not performed.   EKG artifact was also noted.   ABNORMALITY -Continuous slow, generalized  IMPRESSION: This study is suggestive of moderate diffuse encephalopathy, nonspecific etiology. No seizures or epileptiform discharges were seen throughout the recording.  Lora Havens   ECHOCARDIOGRAM COMPLETE BUBBLE STUDY  Result Date: 06/10/2020    ECHOCARDIOGRAM REPORT   Patient Name:   Alfred Carney Date of Exam: 06/10/2020 Medical Rec #:  [id number]    Height:       69.0  in Accession #:    [id number]   Weight:       329.6 lb Date of Birth:  [birth year]     BSA:          2.555 m Patient Age:    48 years     BP:           149/95 mmHg Patient Gender: M            HR:           56 bpm. Exam Location:  Inpatient Procedure: 2D Echo, Color Doppler, Cardiac Doppler and Intracardiac            Opacification Agent Indications:    TIA  History:        Patient has prior history of Echocardiogram examinations, most                 recent 10/27/2018. CAD, COPD; Risk Factors:Hypertension, Diabetes                 and Dyslipidemia.  Sonographer:    Raquel Sarna Senior RDCS Referring Phys: [id number] Kayleen Memos  Sonographer Comments: Very technically difficult study, poor echo windows due to patient body habitus. Image quality not sufficient to perform bubble study. IMPRESSIONS  1. Left ventricular ejection fraction, by estimation, is 60 to 65%. The left ventricle has normal function. The left ventricle has no regional wall motion abnormalities. Left ventricular diastolic parameters are consistent with Grade I diastolic dysfunction (impaired relaxation).  2. Right ventricular systolic function is normal. The right ventricular size is normal.  3. Left atrial size was moderately dilated.  4. The mitral valve is normal in structure. Trivial mitral valve regurgitation. No evidence of mitral stenosis.  5. The aortic valve is normal in structure. Aortic valve regurgitation is not visualized. No aortic stenosis is present.  6. The inferior vena cava is normal in size with greater than 50% respiratory variability, suggesting right atrial pressure of 3 mmHg. FINDINGS  Left Ventricle: Left ventricular ejection fraction, by estimation, is 60 to 65%. The left ventricle has normal function. The left ventricle has no regional wall motion abnormalities. Definity contrast agent was given IV to delineate the left ventricular  endocardial borders. The left ventricular internal cavity size was normal in size. There is no left  ventricular hypertrophy. Left ventricular diastolic parameters are consistent with Grade I diastolic dysfunction (impaired relaxation). Normal left ventricular filling pressure. Right Ventricle: The right ventricular size is normal. No increase in right ventricular wall thickness. Right ventricular systolic function is normal. Left Atrium: Left atrial size was moderately dilated. Right Atrium: Right atrial size was normal in  size. Pericardium: There is no evidence of pericardial effusion. Mitral Valve: The mitral valve is normal in structure. Trivial mitral valve regurgitation. No evidence of mitral valve stenosis. Tricuspid Valve: The tricuspid valve is normal in structure. Tricuspid valve regurgitation is not demonstrated. No evidence of tricuspid stenosis. Aortic Valve: The aortic valve is normal in structure. Aortic valve regurgitation is not visualized. No aortic stenosis is present. Pulmonic Valve: The pulmonic valve was normal in structure. Pulmonic valve regurgitation is not visualized. No evidence of pulmonic stenosis. Aorta: The aortic root is normal in size and structure. Venous: The inferior vena cava is normal in size with greater than 50% respiratory variability, suggesting right atrial pressure of 3 mmHg. IAS/Shunts: No atrial level shunt detected by color flow Doppler.   Diastology LV e' medial:    7.40 cm/s LV E/e' medial:  7.6 LV e' lateral:   5.55 cm/s LV E/e' lateral: 10.2  LEFT ATRIUM           Index LA Vol (A4C): 90.1 ml 35.27 ml/m  AORTIC VALVE LVOT Vmax:   104.00 cm/s LVOT Vmean:  69.700 cm/s LVOT VTI:    0.213 m MITRAL VALVE MV Area (PHT): 2.39 cm    SHUNTS MV Decel Time: 317 msec    Systemic VTI: 0.21 m MV E velocity: 56.60 cm/s MV A velocity: 84.80 cm/s MV E/A ratio:  0.67 Skeet Latch MD Electronically signed by Skeet Latch MD Signature Date/Time: 06/10/2020/12:17:21 PM    Final    VAS US CAROTID  Result Date: 06/11/2020 Carotid Arterial Duplex Study Indications:       >70%  left ICA stenosis by CT. Speech disturbance and Altered                    mental status. Risk Factors:      Hypertension, hyperlipidemia, Diabetes, coronary artery                    disease. Other Factors:     COPD, on Home O2. Limitations        Today's exam was limited due to the body habitus of the                    patient and acoustic shadowing. Comparison Study:  No prior study Performing Technologist: Sharion Dove RVS Supporting Technologist: Darlin Coco  Examination Guidelines: A complete evaluation includes B-mode imaging, spectral Doppler, color Doppler, and power Doppler as needed of all accessible portions of each vessel. Bilateral testing is considered an integral part of a complete examination. Limited examinations for reoccurring indications may be performed as noted.  Right Carotid Findings: +----------+--------+--------+--------+------------------+------------------+             PSV cm/s EDV cm/s Stenosis Plaque Description Comments            +----------+--------+--------+--------+------------------+------------------+  CCA Prox   115      10                                   intimal thickening  +----------+--------+--------+--------+------------------+------------------+  CCA Distal 100      17                                   intimal thickening  +----------+--------+--------+--------+------------------+------------------+  ICA Prox   82       12  1-39%    calcific                               +----------+--------+--------+--------+------------------+------------------+  ICA Distal 100      21                                                       +----------+--------+--------+--------+------------------+------------------+  ECA        120      10                                                       +----------+--------+--------+--------+------------------+------------------+ +----------+--------+-------+--------+-------------------+             PSV cm/s EDV cms Describe Arm Pressure  (mmHG)  +----------+--------+-------+--------+-------------------+  Subclavian 141                                            +----------+--------+-------+--------+-------------------+ +---------+--------+--+--------+-+  Vertebral PSV cm/s 56 EDV cm/s 5  +---------+--------+--+--------+-+  Left Carotid Findings: +----------+--------+--------+--------+------------------+------------------+             PSV cm/s EDV cm/s Stenosis Plaque Description Comments            +----------+--------+--------+--------+------------------+------------------+  CCA Prox   140      33                                   intimal thickening  +----------+--------+--------+--------+------------------+------------------+  CCA Distal 91       22                calcific                               +----------+--------+--------+--------+------------------+------------------+  ICA Prox   270      47       40-59%   calcific           Shadowing           +----------+--------+--------+--------+------------------+------------------+  ICA Mid                               calcific           Shadowing           +----------+--------+--------+--------+------------------+------------------+  ICA Distal                                               Not visualized      +----------+--------+--------+--------+------------------+------------------+  ECA        269      21                                                       +----------+--------+--------+--------+------------------+------------------+ +----------+--------+--------+--------+-------------------+  PSV cm/s EDV cm/s Describe Arm Pressure (mmHG)  +----------+--------+--------+--------+-------------------+  Subclavian 265                                             +----------+--------+--------+--------+-------------------+ +---------+--------+---+--------+--+  Vertebral PSV cm/s 123 EDV cm/s 12  +---------+--------+---+--------+--+   Summary: Right Carotid: Velocities in the right ICA  are consistent with a 1-39% stenosis. Left Carotid: Velocities in the left ICA are consistent with a 40-59% stenosis.               Higher velocities may be obscured by calcific plaque and acoustic               shadowing.  *See table(s) above for measurements and observations.  Electronically signed by Antony Contras MD on 06/11/2020 at 12:03:24 PM.    Final         Scheduled Meds:  Chlorhexidine Gluconate Cloth  6 each Topical Daily   cholecalciferol  1,000 Units Oral Daily   enoxaparin (LOVENOX) injection  0.5 mg/kg Subcutaneous Q24H   furosemide  20 mg Oral Daily   insulin aspart  0-9 Units Subcutaneous Q4H   irbesartan  300 mg Oral Daily   metoprolol succinate  25 mg Oral Daily   pravastatin  20 mg Oral Daily   tamsulosin  0.4 mg Oral QHS   Continuous Infusions:        Aline August, MD [facility] 06/12/2020, 7:12 AM

## 2020-06-13 DIAGNOSIS — H547 Unspecified visual loss: Secondary | ICD-10-CM

## 2020-06-13 LAB — GLUCOSE, CAPILLARY
Glucose-Capillary: 135 mg/dL — ABNORMAL HIGH (ref 70–99)
Glucose-Capillary: 118 mg/dL — ABNORMAL HIGH (ref 70–99)
Glucose-Capillary: 175 mg/dL — ABNORMAL HIGH (ref 70–99)

## 2020-06-13 MED ORDER — FUROSEMIDE 20 MG PO TABS
40.0000 mg | ORAL_TABLET | Freq: Every day | ORAL | 0 refills | Status: DC
Start: 2020-06-13 — End: 2020-07-17

## 2020-06-13 MED ORDER — ACETAMINOPHEN 325 MG PO TABS: 650.0000 mg | ORAL_TABLET | Freq: Four times a day (QID) | ORAL | Status: DC | PRN

## 2020-06-13 NOTE — Plan of Care (Signed)
  Problem: Education: Goal: Knowledge of disease or condition will improve 06/13/2020 1249 by Myriam Forehand, RN Outcome: Adequate for Discharge 06/13/2020 0815 by Myriam Forehand, RN Outcome: Adequate for Discharge Goal: Knowledge of secondary prevention will improve 06/13/2020 1249 by Myriam Forehand, RN Outcome: Adequate for Discharge 06/13/2020 0815 by Myriam Forehand, RN Outcome: Adequate for Discharge Goal: Knowledge of patient specific risk factors addressed and post discharge goals established will improve 06/13/2020 1249 by Myriam Forehand, RN Outcome: Adequate for Discharge 06/13/2020 0815 by Myriam Forehand, RN Outcome: Progressing Goal: Individualized Educational Video(s) Outcome: Adequate for Discharge   Problem: Coping: Goal: Will verbalize positive feelings about self 06/13/2020 1249 by Myriam Forehand, RN Outcome: Adequate for Discharge 06/13/2020 0815 by Myriam Forehand, RN Outcome: Adequate for Discharge Goal: Will identify appropriate support needs 06/13/2020 1249 by Myriam Forehand, RN Outcome: Adequate for Discharge 06/13/2020 0815 by Myriam Forehand, RN Outcome: Adequate for Discharge   Problem: Health Behavior/Discharge Planning: Goal: Ability to manage health-related needs will improve 06/13/2020 1249 by Myriam Forehand, RN Outcome: Adequate for Discharge 06/13/2020 0815 by Myriam Forehand, RN Outcome: Adequate for Discharge   Problem: Self-Care: Goal: Ability to participate in self-care as condition permits will improve Outcome: Adequate for Discharge Goal: Verbalization of feelings and concerns over difficulty with self-care will improve Outcome: Adequate for Discharge Goal: Ability to communicate needs accurately will improve Outcome: Adequate for Discharge   Problem: Nutrition: Goal: Risk of aspiration will decrease Outcome: Adequate for Discharge Goal: Dietary intake will improve Outcome: Adequate for Discharge   Problem: Intracerebral Hemorrhage Tissue Perfusion: Goal:  Complications of Intracerebral Hemorrhage will be minimized Outcome: Adequate for Discharge   Problem: Ischemic Stroke/TIA Tissue Perfusion: Goal: Complications of ischemic stroke/TIA will be minimized Outcome: Adequate for Discharge   Problem: Spontaneous Subarachnoid Hemorrhage Tissue Perfusion: Goal: Complications of Spontaneous Subarachnoid Hemorrhage will be minimized Outcome: Adequate for Discharge

## 2020-06-13 NOTE — Discharge Summary (Signed)
Physician Discharge Summary  Alfred Carney LMB:[id number] DOB: July 26, 1951 DOA: 06/10/2020  PCP: Lesleigh Noe, MD  Admit date: 06/10/2020 Discharge date: 06/13/2020  Admitted From: Home Disposition: Home.  Patient refused CIR placement  Recommendations for Outpatient Follow-up:  1. Follow up with PCP in 1 week with repeat CBC/BMP 2. Outpatient follow-up with neurology 3. Outpatient follow-up with ophthalmology 4. Follow up in ED if symptoms worsen or new appear   Home Health: PT/OT Equipment/Devices: Continue supplemental oxygen via nasal cannula at 2 L/min which patient uses at home.  Discharge Condition: Stable CODE STATUS: Full Diet recommendation: Heart healthy/carb modified  Brief/Interim Summary: 69 year old male with history of COPD, coronary artery disease, diabetes mellitus type 2, GERD, fatty liver, hyperlipidemia, essential hypertension, morbid obesity, chronic hypoxia on 2 L oxygen at home, thoracoabdominal aortic aneurysm status post repair, OSA presented with difficulty speaking followed by being nonverbal along with vision loss.  On presentation, code stroke was called.  CT of the head was negative for acute intracranial abnormality.  Neurology was consulted.  CTA of the head and neck was negative for large vessel occlusion.  CT perfusion was negative for acute infarct or perfusion defect.  During hospitalization, MRI of the brain showed possible posterior reversible encephalopathy syndrome from hypertension.  Neurology recommended strict blood pressure control and signed off.  Speech has much improved.  Vision is also improving.  PT recommended CIR.  Patient refused CIR placement.  He will be discharged to home with home health PT/OT.  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  - CT of the head  was negative for acute intracranial abnormality.  Neurology was consulted.   -CTA of the head and neck was negative for large vessel occlusion.  CT perfusion was negative for acute infarct or perfusion defect. -MRI of the brain showed findings most consistent with press with no acute intracranial infarct or other abnormality.  MRA of the head was negative for large vessel occlusion. -LTM EEG was negative for any seizure activities. -PT/OT recommended CIR placement.   -Echo showed EF of 60 to 65% with grade 1 diastolic dysfunction  -carotid ultrasound showed right ICA 1 to 39% stenosis and left ICA 40 to 59% stenosis -Continue neurochecks.  Fall precautions. -Diet as per SLP recommendations. -Neurology recommended strict blood pressure control and has signed off - Patient refused CIR placement.  He will be discharged to home with home health PT/OT.   Diabetes mellitus type 2 with hyperglycemia -A1c 6.9.    Carb modified diet.  Resume home regimen.  Hypertension -blood pressure is improving but still intermittently on the higher side.  Monitor.  Continue metoprolol and irbesartan.  Increase Lasix to 40 mg daily.  Outpatient follow-up with PCP. Goal blood pressure on discharge 140/90 as per neurology recommendations  Morbid obesity -Outpatient follow-up -Counseled multiple times regarding compliance with diet and exercise for weight loss.  BPH -Continue tamsulosin  Hyperlipidemia -continue statin  Chronic hypoxic respiratory failure -Uses nasal cannula oxygen at 2L/min at home.  Continue oxygen supplementation.  Respiratory status stable.   Discharge Instructions  Discharge Instructions    Ambulatory referral to Neurology   Complete by: As directed    An appointment is requested in approximately: 1 to 2 weeks for hospital follow-up. Dx: PRES   Diet - low sodium heart healthy   Complete by: As directed  Diet Carb Modified   Complete by: As directed    Increase activity  slowly   Complete by: As directed      Allergies as of 06/13/2020   No Known Allergies     Medication List    STOP taking these medications   ketoconazole 2 % shampoo Commonly known as: NIZORAL   oxyCODONE 5 MG immediate release tablet Commonly known as: Oxy IR/ROXICODONE     TAKE these medications   acetaminophen 325 MG tablet Commonly known as: TYLENOL Take 2 tablets (650 mg total) by mouth every 6 (six) hours as needed for mild pain or moderate pain.   albuterol 108 (90 Base) MCG/ACT inhaler Commonly known as: VENTOLIN HFA Inhale 2 puffs into the lungs every 6 (six) hours as needed for wheezing or shortness of breath.   Alka-Seltzer Heartburn + Gas 750-80 MG Chew Generic drug: Calcium Carbonate-Simethicone Chew 1 tablet by mouth as needed (gas).   furosemide 20 MG tablet Commonly known as: LASIX Take 2 tablets (40 mg total) by mouth daily. What changed: how much to take   glipiZIDE 5 MG tablet Commonly known as: GLUCOTROL Take 1 tablet (5 mg total) by mouth 2 (two) times daily before a meal.   irbesartan 300 MG tablet Commonly known as: AVAPRO Take 1 tablet (300 mg total) by mouth daily.   metoprolol succinate 25 MG 24 hr tablet Commonly known as: TOPROL-XL Take 1 tablet by mouth once daily   multivitamin with minerals tablet Take 1 tablet by mouth daily.   nitroGLYCERIN 0.4 MG SL tablet Commonly known as: NITROSTAT Place 1 tablet (0.4 mg total) under the tongue every 5 (five) minutes x 3 doses as needed for chest pain.   OXYGEN Inhale 2 L into the lungs continuous.   pravastatin 20 MG tablet Commonly known as: PRAVACHOL Take 1 tablet (20 mg total) by mouth daily.   tamsulosin 0.4 MG Caps capsule Commonly known as: FLOMAX Take 1 capsule by mouth once daily at bedtime   VITAMIN D PO Take 1 capsule by mouth daily.       Follow-up Information    [facility] Follow up.   Specialty: Home Health Services Why: The home health agency will  contact you for the first home visit. Contact information: [address] [place] [address] [phone]        Lesleigh Noe, MD. Schedule an appointment as soon as possible for a visit in 1 week(s).   Specialty: Family Medicine Why: with cbc/bmp Contact information: [place] [address] [phone]        [place], Peter M, MD .   Specialty: Cardiology Contact information: [address] [place] [address] [phone]        Ophthalmologist. Schedule an appointment as soon as possible for a visit in 1 week(s).              No Known Allergies  Consultations:  Neurology   Procedures/Studies: CT Code Stroke CTA Head W/WO contrast  Result Date: 06/10/2020 CLINICAL DATA:  Initial evaluation for acute neuro deficit, stroke suspected, right-sided weakness, visual loss. EXAM: CT ANGIOGRAPHY HEAD AND NECK CT PERFUSION BRAIN TECHNIQUE: Multidetector CT imaging of the head and neck was performed using the standard protocol during bolus administration of intravenous contrast. Multiplanar CT image reconstructions and MIPs were obtained to evaluate the vascular anatomy. Carotid stenosis measurements (when applicable) are obtained utilizing NASCET criteria, using the distal internal carotid  diameter as the denominator. Multiphase CT imaging of the brain was performed following IV bolus contrast injection. Subsequent parametric perfusion maps were calculated using RAPID software. CONTRAST:  126mL OMNIPAQUE IOHEXOL 350 MG/ML SOLN COMPARISON:  Prior head CT from earlier the same day. FINDINGS: CTA NECK FINDINGS Aortic arch: Visualized aortic arch of normal caliber with normal 3 vessel morphology. Moderate atheromatous change about the arch and origin of the great vessels without hemodynamically significant stenosis. Right carotid system: Right CCA patent from its origin to the bifurcation without stenosis. Scattered calcified plaque about the  right bifurcation/proximal right ICA without significant stenosis. Additional scattered eccentric calcified plaque within the distal right ICA just prior to the skull base with no more than mild stenosis of up to 35% by NASCET criteria. No dissection or other acute vascular abnormality. Left carotid system: Left CCA patent from its origin to the bifurcation without stenosis. Extensive bulky calcified plaque about the left bifurcation/proximal left ICA with associated stenosis of up to approximately 70% by NASCET criteria. Additional scattered eccentric calcified plaque within the distal cervical left ICA just prior to the skull base without hemodynamically significant stenosis. No dissection or other acute vascular abnormality. Vertebral arteries: Both vertebral arteries arise from the subclavian arteries. No hemodynamically significant proximal subclavian artery stenosis. Atheromatous change at the origin of the right vertebral artery with no more than mild stenosis. Proximal vertebral arteries not well assessed due to motion, but grossly patent without abnormality. Visualized portions of the vertebral arteries patent without dissection, stenosis, or occlusion. Skeleton: No acute osseous abnormality. No discrete or worrisome osseous lesions. Patient is edentulous. Other neck: No other acute soft tissue abnormality within the neck. No mass lesion or adenopathy. Upper chest: Visualized upper chest demonstrates no acute finding. Scattered atelectatic changes noted within the visualized lungs. Review of the MIP images confirms the above findings CTA HEAD FINDINGS Anterior circulation: Petrous segments patent bilaterally. Scattered atheromatous plaque throughout the cavernous/supraclinoid ICAs with associated mild to moderate multifocal narrowing. ICA termini well perfused. A1 segments patent bilaterally. Normal anterior communicating artery complex. Anterior cerebral arteries patent to their distal aspects without  stenosis. No M1 stenosis or occlusion. Normal MCA bifurcations. Distal MCA branches well perfused and symmetric. Posterior circulation: Mild nonstenotic plaque noted within the V4 segments bilaterally. Both vertebral arteries patent to the vertebrobasilar junction without stenosis. Both picas patent. Basilar widely patent to its distal aspect without stenosis. Superior cerebral arteries patent bilaterally. Both PCAs primarily supplied via the basilar well perfused or distal aspects. Venous sinuses: Patent. Anatomic variants: None significant.  No intracranial aneurysm. Review of the MIP images confirms the above findings CT Brain Perfusion Findings: ASPECTS: 10 CBF (<30%) Volume: 57mL Perfusion (Tmax>6.0s) volume: 86mL Mismatch Volume: 45mL Infarction Location:Negative CT perfusion with no evidence for acute core infarct or other perfusion deficit. IMPRESSION: CTA HEAD AND NECK IMPRESSION: 1. Negative CTA for emergent large vessel occlusion. 2. 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 3. Additional moderate intracranial atheromatous change elsewhere about the major arterial vasculature of the head and neck. No other proximal high-grade or correctable stenosis. CT PERFUSION IMPRESSION: Negative CT perfusion. No evidence for acute core infarct or other perfusion deficit. Critical Value/emergent results were called by telephone at the time of interpretation on 06/10/2020 at 1:17 am to provider ERIC [facility] , who verbally acknowledged these results. Electronically Signed   By: Jeannine Boga M.D.   On: 06/10/2020 01:46   CT Code Stroke CTA Neck W/WO contrast  Result Date:  06/10/2020 CLINICAL DATA:  Initial evaluation for acute neuro deficit, stroke suspected, right-sided weakness, visual loss. EXAM: CT ANGIOGRAPHY HEAD AND NECK CT PERFUSION BRAIN TECHNIQUE: Multidetector CT imaging of the head and neck was performed using the standard protocol during bolus administration of  intravenous contrast. Multiplanar CT image reconstructions and MIPs were obtained to evaluate the vascular anatomy. Carotid stenosis measurements (when applicable) are obtained utilizing NASCET criteria, using the distal internal carotid diameter as the denominator. Multiphase CT imaging of the brain was performed following IV bolus contrast injection. Subsequent parametric perfusion maps were calculated using RAPID software. CONTRAST:  188mL OMNIPAQUE IOHEXOL 350 MG/ML SOLN COMPARISON:  Prior head CT from earlier the same day. FINDINGS: CTA NECK FINDINGS Aortic arch: Visualized aortic arch of normal caliber with normal 3 vessel morphology. Moderate atheromatous change about the arch and origin of the great vessels without hemodynamically significant stenosis. Right carotid system: Right CCA patent from its origin to the bifurcation without stenosis. Scattered calcified plaque about the right bifurcation/proximal right ICA without significant stenosis. Additional scattered eccentric calcified plaque within the distal right ICA just prior to the skull base with no more than mild stenosis of up to 35% by NASCET criteria. No dissection or other acute vascular abnormality. Left carotid system: Left CCA patent from its origin to the bifurcation without stenosis. Extensive bulky calcified plaque about the left bifurcation/proximal left ICA with associated stenosis of up to approximately 70% by NASCET criteria. Additional scattered eccentric calcified plaque within the distal cervical left ICA just prior to the skull base without hemodynamically significant stenosis. No dissection or other acute vascular abnormality. Vertebral arteries: Both vertebral arteries arise from the subclavian arteries. No hemodynamically significant proximal subclavian artery stenosis. Atheromatous change at the origin of the right vertebral artery with no more than mild stenosis. Proximal vertebral arteries not well assessed due to motion, but  grossly patent without abnormality. Visualized portions of the vertebral arteries patent without dissection, stenosis, or occlusion. Skeleton: No acute osseous abnormality. No discrete or worrisome osseous lesions. Patient is edentulous. Other neck: No other acute soft tissue abnormality within the neck. No mass lesion or adenopathy. Upper chest: Visualized upper chest demonstrates no acute finding. Scattered atelectatic changes noted within the visualized lungs. Review of the MIP images confirms the above findings CTA HEAD FINDINGS Anterior circulation: Petrous segments patent bilaterally. Scattered atheromatous plaque throughout the cavernous/supraclinoid ICAs with associated mild to moderate multifocal narrowing. ICA termini well perfused. A1 segments patent bilaterally. Normal anterior communicating artery complex. Anterior cerebral arteries patent to their distal aspects without stenosis. No M1 stenosis or occlusion. Normal MCA bifurcations. Distal MCA branches well perfused and symmetric. Posterior circulation: Mild nonstenotic plaque noted within the V4 segments bilaterally. Both vertebral arteries patent to the vertebrobasilar junction without stenosis. Both picas patent. Basilar widely patent to its distal aspect without stenosis. Superior cerebral arteries patent bilaterally. Both PCAs primarily supplied via the basilar well perfused or distal aspects. Venous sinuses: Patent. Anatomic variants: None significant.  No intracranial aneurysm. Review of the MIP images confirms the above findings CT Brain Perfusion Findings: ASPECTS: 10 CBF (<30%) Volume: 44mL Perfusion (Tmax>6.0s) volume: 41mL Mismatch Volume: 69mL Infarction Location:Negative CT perfusion with no evidence for acute core infarct or other perfusion deficit. IMPRESSION: CTA HEAD AND NECK IMPRESSION: 1. Negative CTA for emergent large vessel occlusion. 2. 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 3. Additional  moderate intracranial atheromatous change elsewhere about the major arterial vasculature of the head and neck. No  other proximal high-grade or correctable stenosis. CT PERFUSION IMPRESSION: Negative CT perfusion. No evidence for acute core infarct or other perfusion deficit. Critical Value/emergent results were called by telephone at the time of interpretation on 06/10/2020 at 1:17 am to provider ERIC [facility] , who verbally acknowledged these results. Electronically Signed   By: Jeannine Boga M.D.   On: 06/10/2020 01:46   MR ANGIO HEAD WO CONTRAST  Result Date: 06/10/2020 CLINICAL DATA:  Initial evaluation for acute TIA. EXAM: MRI HEAD WITHOUT CONTRAST MRA HEAD WITHOUT CONTRAST TECHNIQUE: Multiplanar, multiecho pulse sequences of the brain and surrounding structures were obtained without intravenous contrast. Angiographic images of the head were obtained using MRA technique without contrast. COMPARISON:  Prior CTs from earlier the same day. FINDINGS: MRI HEAD FINDINGS Brain: Examination degraded by motion artifact. Diffuse prominence of the CSF containing spaces compatible generalized age-related cerebral atrophy. No significant cerebral white matter disease for age. No abnormal foci of restricted diffusion to suggest acute or subacute ischemia. No encephalomalacia to suggest chronic cortical infarction. No foci of susceptibility artifact to suggest acute or chronic intracranial hemorrhage. Patchy multifocal cortical subcortical T2/FLAIR signal abnormality seen involving the bilateral parietal and occipital regions, left slightly worse than right, most consistent with PRES. Mild patchy involvement of the left greater than right frontal parietal regions noted as well, watershed in distribution. Probable subtle cerebellar involvement noted as well. No associated hemorrhage or mass effect. No mass lesion, midline shift or mass effect. No hydrocephalus or extra-axial fluid collection. Pituitary gland  suprasellar region normal. Midline structures intact. Vascular: Major intracranial vascular flow voids are maintained. Skull and upper cervical spine: Craniocervical junction within normal limits. Bone marrow signal intensity normal. No scalp soft tissue abnormality. Sinuses/Orbits: Globes and orbital soft tissues demonstrate no acute finding. Paranasal sinuses are clear. Small bilateral mastoid effusions noted, of doubtful significance. Visualized nasopharynx within normal limits. Other: None. MRA HEAD FINDINGS Examination moderately degraded by motion artifact. ANTERIOR CIRCULATION: Both internal carotid arteries patent to the termini without stenosis or other abnormality. A1 segments patent bilaterally. Normal anterior communicating artery complex. Short-segment mild proximal left A2 stenosis noted. ACAs otherwise patent to their distal aspects without stenosis. No M1 stenosis or occlusion. Normal MCA bifurcations. Distal MCA branches well perfused and symmetric. POSTERIOR CIRCULATION: Visualized vertebral arteries widely patent to the vertebrobasilar junction without stenosis. Both picas patent proximally. Basilar patent to its distal aspect without stenosis. Superior cerebral arteries patent bilaterally. Both PCAs primarily supplied via the basilar well perfused or distal aspects. No intracranial aneurysm. IMPRESSION: MRI HEAD IMPRESSION: 1. Patchy multifocal cortical and subcortical T2/FLAIR signal abnormality involving the bilateral parietal and occipital regions, left slightly worse than right, most consistent with PRES. No associated hemorrhage or mass effect. 2. Underlying age-related cerebral atrophy. No acute intracranial infarct or other abnormality. MRA HEAD IMPRESSION: Negative intracranial MRA. No large vessel occlusion, hemodynamically significant stenosis, or other acute vascular abnormality. No aneurysm. Electronically Signed   By: Jeannine Boga M.D.   On: 06/10/2020 06:57   MR BRAIN WO  CONTRAST  Result Date: 06/10/2020 CLINICAL DATA:  Initial evaluation for acute TIA. EXAM: MRI HEAD WITHOUT CONTRAST MRA HEAD WITHOUT CONTRAST TECHNIQUE: Multiplanar, multiecho pulse sequences of the brain and surrounding structures were obtained without intravenous contrast. Angiographic images of the head were obtained using MRA technique without contrast. COMPARISON:  Prior CTs from earlier the same day. FINDINGS: MRI HEAD FINDINGS Brain: Examination degraded by motion artifact. Diffuse prominence of the CSF containing spaces compatible generalized age-related cerebral  atrophy. No significant cerebral white matter disease for age. No abnormal foci of restricted diffusion to suggest acute or subacute ischemia. No encephalomalacia to suggest chronic cortical infarction. No foci of susceptibility artifact to suggest acute or chronic intracranial hemorrhage. Patchy multifocal cortical subcortical T2/FLAIR signal abnormality seen involving the bilateral parietal and occipital regions, left slightly worse than right, most consistent with PRES. Mild patchy involvement of the left greater than right frontal parietal regions noted as well, watershed in distribution. Probable subtle cerebellar involvement noted as well. No associated hemorrhage or mass effect. No mass lesion, midline shift or mass effect. No hydrocephalus or extra-axial fluid collection. Pituitary gland suprasellar region normal. Midline structures intact. Vascular: Major intracranial vascular flow voids are maintained. Skull and upper cervical spine: Craniocervical junction within normal limits. Bone marrow signal intensity normal. No scalp soft tissue abnormality. Sinuses/Orbits: Globes and orbital soft tissues demonstrate no acute finding. Paranasal sinuses are clear. Small bilateral mastoid effusions noted, of doubtful significance. Visualized nasopharynx within normal limits. Other: None. MRA HEAD FINDINGS Examination moderately degraded by motion  artifact. ANTERIOR CIRCULATION: Both internal carotid arteries patent to the termini without stenosis or other abnormality. A1 segments patent bilaterally. Normal anterior communicating artery complex. Short-segment mild proximal left A2 stenosis noted. ACAs otherwise patent to their distal aspects without stenosis. No M1 stenosis or occlusion. Normal MCA bifurcations. Distal MCA branches well perfused and symmetric. POSTERIOR CIRCULATION: Visualized vertebral arteries widely patent to the vertebrobasilar junction without stenosis. Both picas patent proximally. Basilar patent to its distal aspect without stenosis. Superior cerebral arteries patent bilaterally. Both PCAs primarily supplied via the basilar well perfused or distal aspects. No intracranial aneurysm. IMPRESSION: MRI HEAD IMPRESSION: 1. Patchy multifocal cortical and subcortical T2/FLAIR signal abnormality involving the bilateral parietal and occipital regions, left slightly worse than right, most consistent with PRES. No associated hemorrhage or mass effect. 2. Underlying age-related cerebral atrophy. No acute intracranial infarct or other abnormality. MRA HEAD IMPRESSION: Negative intracranial MRA. No large vessel occlusion, hemodynamically significant stenosis, or other acute vascular abnormality. No aneurysm. Electronically Signed   By: Jeannine Boga M.D.   On: 06/10/2020 06:57   CT Code Stroke Cerebral Perfusion with contrast  Result Date: 06/10/2020 CLINICAL DATA:  Initial evaluation for acute neuro deficit, stroke suspected, right-sided weakness, visual loss. EXAM: CT ANGIOGRAPHY HEAD AND NECK CT PERFUSION BRAIN TECHNIQUE: Multidetector CT imaging of the head and neck was performed using the standard protocol during bolus administration of intravenous contrast. Multiplanar CT image reconstructions and MIPs were obtained to evaluate the vascular anatomy. Carotid stenosis measurements (when applicable) are obtained utilizing NASCET  criteria, using the distal internal carotid diameter as the denominator. Multiphase CT imaging of the brain was performed following IV bolus contrast injection. Subsequent parametric perfusion maps were calculated using RAPID software. CONTRAST:  169mL OMNIPAQUE IOHEXOL 350 MG/ML SOLN COMPARISON:  Prior head CT from earlier the same day. FINDINGS: CTA NECK FINDINGS Aortic arch: Visualized aortic arch of normal caliber with normal 3 vessel morphology. Moderate atheromatous change about the arch and origin of the great vessels without hemodynamically significant stenosis. Right carotid system: Right CCA patent from its origin to the bifurcation without stenosis. Scattered calcified plaque about the right bifurcation/proximal right ICA without significant stenosis. Additional scattered eccentric calcified plaque within the distal right ICA just prior to the skull base with no more than mild stenosis of up to 35% by NASCET criteria. No dissection or other acute vascular abnormality. Left carotid system: Left CCA patent from its origin  to the bifurcation without stenosis. Extensive bulky calcified plaque about the left bifurcation/proximal left ICA with associated stenosis of up to approximately 70% by NASCET criteria. Additional scattered eccentric calcified plaque within the distal cervical left ICA just prior to the skull base without hemodynamically significant stenosis. No dissection or other acute vascular abnormality. Vertebral arteries: Both vertebral arteries arise from the subclavian arteries. No hemodynamically significant proximal subclavian artery stenosis. Atheromatous change at the origin of the right vertebral artery with no more than mild stenosis. Proximal vertebral arteries not well assessed due to motion, but grossly patent without abnormality. Visualized portions of the vertebral arteries patent without dissection, stenosis, or occlusion. Skeleton: No acute osseous abnormality. No discrete or  worrisome osseous lesions. Patient is edentulous. Other neck: No other acute soft tissue abnormality within the neck. No mass lesion or adenopathy. Upper chest: Visualized upper chest demonstrates no acute finding. Scattered atelectatic changes noted within the visualized lungs. Review of the MIP images confirms the above findings CTA HEAD FINDINGS Anterior circulation: Petrous segments patent bilaterally. Scattered atheromatous plaque throughout the cavernous/supraclinoid ICAs with associated mild to moderate multifocal narrowing. ICA termini well perfused. A1 segments patent bilaterally. Normal anterior communicating artery complex. Anterior cerebral arteries patent to their distal aspects without stenosis. No M1 stenosis or occlusion. Normal MCA bifurcations. Distal MCA branches well perfused and symmetric. Posterior circulation: Mild nonstenotic plaque noted within the V4 segments bilaterally. Both vertebral arteries patent to the vertebrobasilar junction without stenosis. Both picas patent. Basilar widely patent to its distal aspect without stenosis. Superior cerebral arteries patent bilaterally. Both PCAs primarily supplied via the basilar well perfused or distal aspects. Venous sinuses: Patent. Anatomic variants: None significant.  No intracranial aneurysm. Review of the MIP images confirms the above findings CT Brain Perfusion Findings: ASPECTS: 10 CBF (<30%) Volume: 21mL Perfusion (Tmax>6.0s) volume: 53mL Mismatch Volume: 9mL Infarction Location:Negative CT perfusion with no evidence for acute core infarct or other perfusion deficit. IMPRESSION: CTA HEAD AND NECK IMPRESSION: 1. Negative CTA for emergent large vessel occlusion. 2. 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 3. Additional moderate intracranial atheromatous change elsewhere about the major arterial vasculature of the head and neck. No other proximal high-grade or correctable stenosis. CT PERFUSION IMPRESSION:  Negative CT perfusion. No evidence for acute core infarct or other perfusion deficit. Critical Value/emergent results were called by telephone at the time of interpretation on 06/10/2020 at 1:17 am to provider ERIC [facility] , who verbally acknowledged these results. Electronically Signed   By: Jeannine Boga M.D.   On: 06/10/2020 01:46   EEG adult  Result Date: 06/10/2020 Lora Havens, MD     06/10/2020 10:25 PM Patient Name: Alfred Carney MRN: [id number] Epilepsy Attending: Lora Havens Referring Physician/Provider: Dr Kerney Elbe Date: 06/10/2020 Duration: 23.38 mins Patient history: 69 y.o. male presenting with acute onset of visual loss and mutism. EEG to evaluate for seizure. Level of alertness:  Awake, asleep AEDs during EEG study: None Technical aspects: This EEG study was done with scalp electrodes positioned according to the 10-20 International system of electrode placement. Electrical activity was acquired at a sampling rate of 500Hz  and reviewed with a high frequency filter of 70Hz  and a low frequency filter of 1Hz . EEG data were recorded continuously and digitally stored. Description: No posterior dominant rhythm was seen. Sleep was characterized by vertex waves, sleep spindles (12-14hz ), maximal frontocentral region. EEG showed continuous generalized 3 to 6 Hz theta-delta slowing. Physiologic photic driving was not seen during  photic stimulation.  Hyperventilation was not performed.  EKG artifact was also noted.  ABNORMALITY -Continuous slow, generalized IMPRESSION: This study is suggestive of moderate diffuse encephalopathy, nonspecific etiology. No seizures or epileptiform discharges were seen throughout the recording. Priyanka Barbra Sarks   Overnight EEG with video  Result Date: 06/11/2020 Lora Havens, MD     06/11/2020  2:08 PM Patient Name: Alfred Carney MRN: [id number] Epilepsy Attending: Lora Havens Referring Physician/Provider: Dr Kerney Elbe Duration: 06/10/2020 2145 to  06/11/2020 1211  Patient history: 69 y.o.malepresenting with acute onset of visual loss and mutism. EEG to evaluate for seizure.  Level of alertness:  Awake, asleep  AEDs during EEG study: None  Technical aspects: This EEG study was done with scalp electrodes positioned according to the 10-20 International system of electrode placement. Electrical activity was acquired at a sampling rate of 500Hz  and reviewed with a high frequency filter of 70Hz  and a low frequency filter of 1Hz . EEG data were recorded continuously and digitally stored.  Description: No posterior dominant rhythm was seen. Sleep was characterized by vertex waves, sleep spindles (12-14hz ), maximal frontocentral region. EEG showed continuous generalized 3 to 6 Hz theta-delta slowing. Physiologic photic driving was not seen during photic stimulation.  Hyperventilation was not performed.   EKG artifact was also noted.   ABNORMALITY -Continuous slow, generalized  IMPRESSION: This study is suggestive of moderate diffuse encephalopathy, nonspecific etiology. No seizures or epileptiform discharges were seen throughout the recording.  Lora Havens   ECHOCARDIOGRAM COMPLETE BUBBLE STUDY  Result Date: 06/10/2020    ECHOCARDIOGRAM REPORT   Patient Name:   Alfred Carney Date of Exam: 06/10/2020 Medical Rec #:  [id number]    Height:       69.0 in Accession #:    [id number]   Weight:       329.6 lb Date of Birth:  1951/08/06     BSA:          2.555 m Patient Age:    23 years     BP:           149/95 mmHg Patient Gender: M            HR:           56 bpm. Exam Location:  Inpatient Procedure: 2D Echo, Color Doppler, Cardiac Doppler and Intracardiac            Opacification Agent Indications:    TIA  History:        Patient has prior history of Echocardiogram examinations, most                 recent 10/27/2018. CAD, COPD; Risk Factors:Hypertension, Diabetes                 and Dyslipidemia.  Sonographer:    Raquel Sarna Senior RDCS Referring Phys: [id number]  Kayleen Memos  Sonographer Comments: Very technically difficult study, poor echo windows due to patient body habitus. Image quality not sufficient to perform bubble study. IMPRESSIONS  1. Left ventricular ejection fraction, by estimation, is 60 to 65%. The left ventricle has normal function. The left ventricle has no regional wall motion abnormalities. Left ventricular diastolic parameters are consistent with Grade I diastolic dysfunction (impaired relaxation).  2. Right ventricular systolic function is normal. The right ventricular size is normal.  3. Left atrial size was moderately dilated.  4. The mitral valve is normal in structure. Trivial mitral valve regurgitation. No evidence of mitral stenosis.  5. The  aortic valve is normal in structure. Aortic valve regurgitation is not visualized. No aortic stenosis is present.  6. The inferior vena cava is normal in size with greater than 50% respiratory variability, suggesting right atrial pressure of 3 mmHg. FINDINGS  Left Ventricle: Left ventricular ejection fraction, by estimation, is 60 to 65%. The left ventricle has normal function. The left ventricle has no regional wall motion abnormalities. Definity contrast agent was given IV to delineate the left ventricular  endocardial borders. The left ventricular internal cavity size was normal in size. There is no left ventricular hypertrophy. Left ventricular diastolic parameters are consistent with Grade I diastolic dysfunction (impaired relaxation). Normal left ventricular filling pressure. Right Ventricle: The right ventricular size is normal. No increase in right ventricular wall thickness. Right ventricular systolic function is normal. Left Atrium: Left atrial size was moderately dilated. Right Atrium: Right atrial size was normal in size. Pericardium: There is no evidence of pericardial effusion. Mitral Valve: The mitral valve is normal in structure. Trivial mitral valve regurgitation. No evidence of mitral valve  stenosis. Tricuspid Valve: The tricuspid valve is normal in structure. Tricuspid valve regurgitation is not demonstrated. No evidence of tricuspid stenosis. Aortic Valve: The aortic valve is normal in structure. Aortic valve regurgitation is not visualized. No aortic stenosis is present. Pulmonic Valve: The pulmonic valve was normal in structure. Pulmonic valve regurgitation is not visualized. No evidence of pulmonic stenosis. Aorta: The aortic root is normal in size and structure. Venous: The inferior vena cava is normal in size with greater than 50% respiratory variability, suggesting right atrial pressure of 3 mmHg. IAS/Shunts: No atrial level shunt detected by color flow Doppler.   Diastology LV e' medial:    7.40 cm/s LV E/e' medial:  7.6 LV e' lateral:   5.55 cm/s LV E/e' lateral: 10.2  LEFT ATRIUM           Index LA Vol (A4C): 90.1 ml 35.27 ml/m  AORTIC VALVE LVOT Vmax:   104.00 cm/s LVOT Vmean:  69.700 cm/s LVOT VTI:    0.213 m MITRAL VALVE MV Area (PHT): 2.39 cm    SHUNTS MV Decel Time: 317 msec    Systemic VTI: 0.21 m MV E velocity: 56.60 cm/s MV A velocity: 84.80 cm/s MV E/A ratio:  0.67 Skeet Latch MD Electronically signed by Skeet Latch MD Signature Date/Time: 06/10/2020/12:17:21 PM    Final    CT HEAD CODE STROKE WO CONTRAST  Result Date: 06/10/2020 CLINICAL DATA:  Code stroke. Initial evaluation for acute right-sided weakness. EXAM: CT HEAD WITHOUT CONTRAST TECHNIQUE: Contiguous axial images were obtained from the base of the skull through the vertex without intravenous contrast. COMPARISON:  None available. FINDINGS: Brain: Age-related cerebral atrophy. No acute intracranial hemorrhage. No acute large vessel territory infarct. No mass lesion, midline shift or mass effect. No hydrocephalus or extra-axial fluid collection. Vascular: No hyperdense vessel. Calcified atherosclerosis present at the skull base. Skull: Scalp soft tissues and calvarium within normal limits. Sinuses/Orbits: Mild  left gaze noted. Globes and orbital soft tissues demonstrate no other acute finding. Mild scattered mucosal thickening noted within the ethmoidal air cells. Paranasal sinuses are otherwise clear. Small bilateral mastoid effusions noted, of doubtful significance. Other: None. ASPECTS [facility] Stroke Program Early CT Score) - Ganglionic level infarction (caudate, lentiform nuclei, internal capsule, insula, M1-M3 cortex): 7 - Supraganglionic infarction (M4-M6 cortex): 3 Total score (0-10 with 10 being normal): 10 IMPRESSION: 1. No acute intracranial infarct or other abnormality. 2. ASPECTS is 10. These results were  communicated to Dr. Cheral Marker at 12:49 amon 9/25/2021by text page via the [facility] messaging system. Electronically Signed   By: Jeannine Boga M.D.   On: 06/10/2020 00:50   VAS US CAROTID  Result Date: 06/11/2020 Carotid Arterial Duplex Study Indications:       >70% left ICA stenosis by CT. Speech disturbance and Altered                    mental status. Risk Factors:      Hypertension, hyperlipidemia, Diabetes, coronary artery                    disease. Other Factors:     COPD, on Home O2. Limitations        Today's exam was limited due to the body habitus of the                    patient and acoustic shadowing. Comparison Study:  No prior study Performing Technologist: Sharion Dove RVS Supporting Technologist: Darlin Coco  Examination Guidelines: A complete evaluation includes B-mode imaging, spectral Doppler, color Doppler, and power Doppler as needed of all accessible portions of each vessel. Bilateral testing is considered an integral part of a complete examination. Limited examinations for reoccurring indications may be performed as noted.  Right Carotid Findings: +----------+--------+--------+--------+------------------+------------------+           PSV cm/sEDV cm/sStenosisPlaque DescriptionComments            +----------+--------+--------+--------+------------------+------------------+ CCA Prox  115     10                                intimal thickening +----------+--------+--------+--------+------------------+------------------+ CCA Distal100     17                                intimal thickening +----------+--------+--------+--------+------------------+------------------+ ICA Prox  82      12      1-39%   calcific                             +----------+--------+--------+--------+------------------+------------------+ ICA Distal100     21                                                   +----------+--------+--------+--------+------------------+------------------+ ECA       120     10                                                   +----------+--------+--------+--------+------------------+------------------+ +----------+--------+-------+--------+-------------------+           PSV cm/sEDV cmsDescribeArm Pressure (mmHG) +----------+--------+-------+--------+-------------------+ [id number]                                        +----------+--------+-------+--------+-------------------+ +---------+--------+--+--------+-+ VertebralPSV cm/s56EDV cm/s5 +---------+--------+--+--------+-+  Left Carotid Findings: +----------+--------+--------+--------+------------------+------------------+           PSV cm/sEDV cm/sStenosisPlaque DescriptionComments           +----------+--------+--------+--------+------------------+------------------+  CCA Prox  140     33                                intimal thickening +----------+--------+--------+--------+------------------+------------------+ CCA Distal91      22              calcific                             +----------+--------+--------+--------+------------------+------------------+ ICA Prox  270     47      40-59%  calcific          Shadowing           +----------+--------+--------+--------+------------------+------------------+ ICA Mid                           calcific          Shadowing          +----------+--------+--------+--------+------------------+------------------+ ICA Distal                                          Not visualized     +----------+--------+--------+--------+------------------+------------------+ ECA       269     21                                                   +----------+--------+--------+--------+------------------+------------------+ +----------+--------+--------+--------+-------------------+           PSV cm/sEDV cm/sDescribeArm Pressure (mmHG) +----------+--------+--------+--------+-------------------+ [id number]                                         +----------+--------+--------+--------+-------------------+ +---------+--------+---+--------+--+ VertebralPSV cm/s123EDV cm/s12 +---------+--------+---+--------+--+   Summary: Right Carotid: Velocities in the right ICA are consistent with a 1-39% stenosis. Left Carotid: Velocities in the left ICA are consistent with a 40-59% stenosis.               Higher velocities may be obscured by calcific plaque and acoustic               shadowing.  *See table(s) above for measurements and observations.  Electronically signed by Antony Contras MD on 06/11/2020 at 12:03:24 PM.    Final        Subjective: Patient seen and examined at bedside.  Denies worsening shortness of breath, nausea, vomiting, fever.  States that his vision is also much improved and wants to go home today.  Does not want to go to [facility].   Discharge Exam: Vitals:   06/13/20 1103 06/13/20 1121  BP: 91/78 (!) 139/94  Pulse: 67   Resp: 18   Temp: (!) 97.5 F (36.4 C)   SpO2: 98%     General exam: No distress.  Chronically ill looking.  Currently on 2 L oxygen via nasal cannula.  Poor historian.  Respiratory system: Bilateral decreased breath sounds at bases with some  scattered crackles  cardiovascular system: S1-S2 heard, rate controlled Gastrointestinal system: Abdomen is morbidly obese, nondistended, soft and nontender.  Normal bowel sounds heard  extremities: No cyanosis.  Trace lower extremity  edema present     The results of significant diagnostics from this hospitalization (including imaging, microbiology, ancillary and laboratory) are listed below for reference.     Microbiology: Recent Results (from the past 240 hour(s))  Respiratory Panel by RT PCR (Flu A&B, Covid) - Nasopharyngeal Swab     Status: None   Collection Time: 06/10/20  3:23 AM   Specimen: Nasopharyngeal Swab  Result Value Ref Range Status   SARS Coronavirus 2 by RT PCR NEGATIVE NEGATIVE Final    Comment: (NOTE) SARS-CoV-2 target nucleic acids are NOT DETECTED.  The SARS-CoV-2 RNA is generally detectable in upper respiratoy specimens during the acute phase of infection. The lowest concentration of SARS-CoV-2 viral copies this assay can detect is 131 copies/mL. A negative result does not preclude SARS-Cov-2 infection and should not be used as the sole basis for treatment or other patient management decisions. A negative result may occur with  improper specimen collection/handling, submission of specimen other than nasopharyngeal swab, presence of viral mutation(s) within the areas targeted by this assay, and inadequate number of viral copies (<131 copies/mL). A negative result must be combined with clinical observations, patient history, and epidemiological information. The expected result is Negative.  Fact Sheet for Patients:  PinkCheek.be  Fact Sheet for Healthcare Providers:  GravelBags.it  This test is no t yet approved or cleared by the [place] FDA and  has been authorized for detection and/or diagnosis of SARS-CoV-2 by FDA under an Emergency Use Authorization (EUA). This EUA will remain  in effect  (meaning this test can be used) for the duration of the COVID-19 declaration under Section 564(b)(1) of the Act, 21 U.S.C. section 360bbb-3(b)(1), unless the authorization is terminated or revoked sooner.     Influenza A by PCR NEGATIVE NEGATIVE Final   Influenza B by PCR NEGATIVE NEGATIVE Final    Comment: (NOTE) The Xpert Xpress SARS-CoV-2/FLU/RSV assay is intended as an aid in  the diagnosis of influenza from Nasopharyngeal swab specimens and  should not be used as a sole basis for treatment. Nasal washings and  aspirates are unacceptable for Xpert Xpress SARS-CoV-2/FLU/RSV  testing.  Fact Sheet for Patients: PinkCheek.be  Fact Sheet for Healthcare Providers: GravelBags.it  This test is not yet approved or cleared by the [place] FDA and  has been authorized for detection and/or diagnosis of SARS-CoV-2 by  FDA under an Emergency Use Authorization (EUA). This EUA will remain  in effect (meaning this test can be used) for the duration of the  Covid-19 declaration under Section 564(b)(1) of the Act, 21  U.S.C. section 360bbb-3(b)(1), unless the authorization is  terminated or revoked. Performed at [facility], [place] [address] [place], [place] [address]      Labs: BNP (last 3 results) No results for input(s): BNP in the last 8760 hours. Basic Metabolic Panel: Recent Labs  Lab 06/10/20 0048 06/10/20 0049 06/11/20 0754  NA 143 145 142  K 4.0 3.7 3.6  CL 106 104 101  CO2 27  --  28  GLUCOSE 182* 168* 105*  BUN 14 15 11   CREATININE 1.18 1.10 1.19  CALCIUM 8.8*  --  8.7*  MG  --   --  1.6*   Liver Function Tests: Recent Labs  Lab 06/10/20 0048 06/11/20 0754  AST 22 23  ALT 38 32  ALKPHOS 63 51  BILITOT 0.7 1.0  PROT 6.2* 5.5*  ALBUMIN 3.6 3.2*   No results for input(s): LIPASE, AMYLASE in the last 168  hours. No results for input(s): AMMONIA in the last 168 hours. CBC: Recent Labs  Lab  06/10/20 0048 06/10/20 0049 06/11/20 0754  WBC 7.0  --  6.0  NEUTROABS 4.2  --  3.4  HGB 14.6 14.3 13.1  HCT 45.1 42.0 40.7  MCV 95.1  --  92.7  PLT 164  --  152   Cardiac Enzymes: No results for input(s): CKTOTAL, CKMB, CKMBINDEX, TROPONINI in the last 168 hours. BNP: Invalid input(s): POCBNP CBG: Recent Labs  Lab 06/12/20 2004 06/12/20 2328 06/13/20 0354 06/13/20 0844 06/13/20 1107  GLUCAP 145* 104* 135* 118* 175*   D-Dimer No results for input(s): DDIMER in the last 72 hours. Hgb A1c Recent Labs    06/10/20 1220  HGBA1C 6.9*   Lipid Profile No results for input(s): CHOL, HDL, LDLCALC, TRIG, CHOLHDL, LDLDIRECT in the last 72 hours. Thyroid function studies No results for input(s): TSH, T4TOTAL, T3FREE, THYROIDAB in the last 72 hours.  Invalid input(s): FREET3 Anemia work up No results for input(s): VITAMINB12, FOLATE, FERRITIN, TIBC, IRON, RETICCTPCT in the last 72 hours. Urinalysis    Component Value Date/Time   COLORURINE STRAW (A) 06/10/2020 0145   APPEARANCEUR CLEAR 06/10/2020 0145   LABSPEC 1.031 (H) 06/10/2020 0145   PHURINE 5.0 06/10/2020 0145   GLUCOSEU 150 (A) 06/10/2020 0145   HGBUR MODERATE (A) 06/10/2020 0145   BILIRUBINUR NEGATIVE 06/10/2020 0145   KETONESUR NEGATIVE 06/10/2020 0145   PROTEINUR NEGATIVE 06/10/2020 0145   UROBILINOGEN 1.0 05/05/2014 1706   NITRITE NEGATIVE 06/10/2020 0145   LEUKOCYTESUR NEGATIVE 06/10/2020 0145   Sepsis Labs Invalid input(s): PROCALCITONIN,  WBC,  LACTICIDVEN Microbiology Recent Results (from the past 240 hour(s))  Respiratory Panel by RT PCR (Flu A&B, Covid) - Nasopharyngeal Swab     Status: None   Collection Time: 06/10/20  3:23 AM   Specimen: Nasopharyngeal Swab  Result Value Ref Range Status   SARS Coronavirus 2 by RT PCR NEGATIVE NEGATIVE Final    Comment: (NOTE) SARS-CoV-2 target nucleic acids are NOT DETECTED.  The SARS-CoV-2 RNA is generally detectable in upper respiratoy specimens during the  acute phase of infection. The lowest concentration of SARS-CoV-2 viral copies this assay can detect is 131 copies/mL. A negative result does not preclude SARS-Cov-2 infection and should not be used as the sole basis for treatment or other patient management decisions. A negative result may occur with  improper specimen collection/handling, submission of specimen other than nasopharyngeal swab, presence of viral mutation(s) within the areas targeted by this assay, and inadequate number of viral copies (<131 copies/mL). A negative result must be combined with clinical observations, patient history, and epidemiological information. The expected result is Negative.  Fact Sheet for Patients:  PinkCheek.be  Fact Sheet for Healthcare Providers:  GravelBags.it  This test is no t yet approved or cleared by the [place] FDA and  has been authorized for detection and/or diagnosis of SARS-CoV-2 by FDA under an Emergency Use Authorization (EUA). This EUA will remain  in effect (meaning this test can be used) for the duration of the COVID-19 declaration under Section 564(b)(1) of the Act, 21 U.S.C. section 360bbb-3(b)(1), unless the authorization is terminated or revoked sooner.     Influenza A by PCR NEGATIVE NEGATIVE Final   Influenza B by PCR NEGATIVE NEGATIVE Final    Comment: (NOTE) The Xpert Xpress SARS-CoV-2/FLU/RSV assay is intended as an aid in  the diagnosis of influenza from Nasopharyngeal swab specimens and  should not be used as a sole  basis for treatment. Nasal washings and  aspirates are unacceptable for Xpert Xpress SARS-CoV-2/FLU/RSV  testing.  Fact Sheet for Patients: PinkCheek.be  Fact Sheet for Healthcare Providers: GravelBags.it  This test is not yet approved or cleared by the [place] FDA and  has been authorized for detection and/or diagnosis  of SARS-CoV-2 by  FDA under an Emergency Use Authorization (EUA). This EUA will remain  in effect (meaning this test can be used) for the duration of the  Covid-19 declaration under Section 564(b)(1) of the Act, 21  U.S.C. section 360bbb-3(b)(1), unless the authorization is  terminated or revoked. Performed at [facility], [place] [address], [place], [place] [address]      Time coordinating discharge: 35 minutes  SIGNED:   Aline August, MD  [facility] 06/13/2020, 11:30 AM

## 2020-06-13 NOTE — Progress Notes (Signed)
SLP Cancellation Note  Patient Details Name: Alfred Carney MRN: [id number] DOB: 1951/03/03   Cancelled treatment:       Reason Eval/Treat Not Completed: Other (comment) (patient has been discharged home with family)   Sonia Baller, MA, [facility]

## 2020-06-13 NOTE — Plan of Care (Signed)
  Problem: Education: Goal: Knowledge of patient specific risk factors addressed and post discharge goals established will improve Outcome: Progressing   Problem: Education: Goal: Knowledge of patient specific risk factors addressed and post discharge goals established will improve Outcome: Progressing   Problem: Education: Goal: Knowledge of patient specific risk factors addressed and post discharge goals established will improve Outcome: Progressing   Problem: Education: Goal: Knowledge of patient specific risk factors addressed and post discharge goals established will improve Outcome: Progressing

## 2020-06-13 NOTE — Progress Notes (Signed)
Physical Therapy Treatment Patient Details Name: Alfred Carney MRN: [id number] DOB: 01/18/1951 Today's Date: 06/13/2020    History of Present Illness The pt is a 69 yo male presenting with AMS, headaches, weakness, and worsening command following. Code stroke was activated upon arrival, MRI showed "signalabnormality involving the bilateral parietal and occipital regions,left slightly worse than right, most consistent with PRES." Pt describes vision changes that usually last 20 minutes (loses central vision, then "fog rolls in," sometimes looks like broken glass with bubbles in the glass). PMH includes: COPD, CAD, DM II, GERD, HLD, HTN, and morbid obesity.    PT Comments    Daughter present throughout session. Patient mentating better today and tolerated incr ambulation with sats >90% on 2L. Daughter has taken time off to provide 24/7 supervision for transition home (due to pt's wife with recent abdominal surgery). Educated in exercises he can safely begin while awaiting HHPT start.     Follow Up Recommendations  Home health PT;Supervision/Assistance - 24 hour (daughter can provide 24/7)     Equipment Recommendations  None recommended by PT    Recommendations for Other Services       Precautions / Restrictions Precautions Precautions: Fall Precaution Comments: watch BP    Mobility  Bed Mobility                  Transfers Overall transfer level: Needs assistance Equipment used: Rolling walker (2 wheeled) Transfers: Sit to/from Stand Sit to Stand: Supervision         General transfer comment: no cues for proper use of RW; repeated x 3  Ambulation/Gait Ambulation/Gait assistance: Min guard Gait Distance (Feet): 75 Feet (x2; 30) Assistive device: Rolling walker (2 wheeled) Gait Pattern/deviations: Step-through pattern;Decreased stride length;Wide base of support;Trunk flexed Gait velocity: decreased   General Gait Details: vc and tactile cues for upright posture and  proximity to RW; on 2L with lowest sats 90%; recovers to 93% with rest   Stairs Stairs:  (deferred; pt and dtr feel sure he can get up 2 steps @ front)           Wheelchair Mobility    Modified Rankin (Stroke Patients Only) Modified Rankin (Stroke Patients Only) Pre-Morbid Rankin Score: Moderate disability Modified Rankin: Moderately severe disability     Balance Overall balance assessment: Needs assistance Sitting-balance support: Feet supported Sitting balance-Leahy Scale: Fair Sitting balance - Comments: sitting edge of chair   Standing balance support: During functional activity Standing balance-Leahy Scale: Fair Standing balance comment: leaning against sink surface                            Cognition Arousal/Alertness: Awake/alert Behavior During Therapy: WFL for tasks assessed/performed Overall Cognitive Status: Impaired/Different from baseline Area of Impairment: Memory;Following commands;Problem solving;Awareness;Safety/judgement                 Orientation Level:  (not tested)   Memory: Decreased short-term memory Following Commands: Follows one step commands consistently Safety/Judgement: Decreased awareness of safety;Decreased awareness of deficits (drives with vision changes; up to bathroom without O2) Awareness: Emergent Problem Solving: Requires verbal cues;Difficulty sequencing General Comments: more appropriate; eager to go home      Exercises Other Exercises Other Exercises: Educated to work on walking for time/distance at home HOWEVER if his gout flares up to substitute seated marching (back supported) with pt demonstrating 10 reps; LAQ x 6 reps Other Exercises: Educated on standing at counter hip abduction (alternating)  General Comments        Pertinent Vitals/Pain Pain Assessment: Faces Faces Pain Scale: Hurts little more Pain Location: rt ankle Pain Descriptors / Indicators: Discomfort (states feels like his gout  ) Pain Intervention(s): Limited activity within patient's tolerance;Monitored during session    Home Living                      Prior Function            PT Goals (current goals can now be found in the care plan section) Acute Rehab PT Goals Patient Stated Goal: to be able to go home with wife Time For Goal Achievement: 06/24/20 Potential to Achieve Goals: Good Progress towards PT goals: Progressing toward goals    Frequency    Min 3X/week      PT Plan Discharge plan needs to be updated    Co-evaluation              AM-PAC PT "6 Clicks" Mobility   Outcome Measure  Help needed turning from your back to your side while in a flat bed without using bedrails?: A Little Help needed moving from lying on your back to sitting on the side of a flat bed without using bedrails?: A Lot Help needed moving to and from a bed to a chair (including a wheelchair)?: A Little Help needed standing up from a chair using your arms (e.g., wheelchair or bedside chair)?: A Little Help needed to walk in hospital room?: A Little Help needed climbing 3-5 steps with a railing? : A Little 6 Click Score: 17    End of Session Equipment Utilized During Treatment: Oxygen (2L O2 (pt's baseline)) Activity Tolerance: Patient tolerated treatment well Patient left: in chair;with call bell/phone within reach;with family/visitor present   PT Visit Diagnosis: Difficulty in walking, not elsewhere classified (R26.2)     Time: 8182-9937 PT Time Calculation (min) (ACUTE ONLY): 46 min  Charges:  $Gait Training: 23-37 mins $Therapeutic Exercise: 8-22 mins                      Arby Barrette, PT Pager [phone]    Rexanne Mano 06/13/2020, 1:54 PM

## 2020-06-13 NOTE — TOC Transition Note (Signed)
Transition of Care (TOC) - CM/SW Discharge Note   Patient Details  Name: Maze Mceuen MRN: [id number] Date of Birth: 10/21/1950  Transition of Care (TOC) CM/SW Contact:  Kelli F Willard, RN Phone Number: 06/13/2020, 11:29 AM   Clinical Narrative:    Pt has decided to d/c home with HH services instead of CIR. CM met with the patient and his daughter at the bedside. CM went over the different rehab choices (CIR, SNF) and HH and outpatient. Pt and daughter want HH. [facility] decided on and have accepted the referral.  Pt has all needed DME at the home.  Daughter did not bring oxygen tank for transport home. Pt is active with [employer] for home oxygen at 2 L. CM notified Zach with [employer] and they will deliver a tank to the room for transport.  Daughter to provide needed supervision and assistance at home.  Daughter will provide transport home.    Final next level of care: Home w Home Health Services Barriers to Discharge: No Barriers Identified   Patient Goals and CMS Choice   CMS Medicare.gov Compare Post Acute Care list provided to:: Patient Choice offered to / list presented to : Patient, Adult Children  Discharge Placement                       Discharge Plan and Services                          HH Arranged: PT, OT HH Agency: [facility] ([facility]) Date HH Agency Contacted: 06/13/20   Representative spoke with at HH Agency: Teresa  Social Determinants of Health (SDOH) Interventions     Readmission Risk Interventions No flowsheet data found.     

## 2020-06-14 ENCOUNTER — Telehealth: Payer: Self-pay

## 2020-06-14 NOTE — Telephone Encounter (Signed)
Transition Care Management Follow-up Telephone Call  Date of discharge and from where: [facility]13/[facility]  How have you been since you were released from the hospital? Patient states that he is doing better just still weak.  Any questions or concerns? No  Items Reviewed:  Did the pt receive and understand the discharge instructions provided? Yes   Medications obtained and verified? Yes   Any new allergies since your discharge? No   Dietary orders reviewed? Yes  Do you have support at home? Yes   Functional Questionnaire: (I = Independent and D = Dependent) ADLs: I  Bathing/Dressing- I  Meal Prep- I  Eating- I  Maintaining continence- I  Transferring/Ambulation- I  Managing Meds- I  Follow up appointments reviewed:   PCP Hospital f/u appt confirmed? No  Patient states that he does not see the need to come in right now. Has physical in November and will come in at that time.  [facility] f/u appt confirmed? No    Are transportation arrangements needed? No   If their condition worsens, is the pt aware to call PCP or go to the Emergency Dept.? Yes  Was the patient provided with contact information for the PCP's office or ED? Yes  Was to pt encouraged to call back with questions or concerns? Yes

## 2020-06-14 NOTE — Telephone Encounter (Signed)
Please call patient back.   I have reviewed his discharge summary which recommended 1 week follow-up and blood work at that visit.   Of note, they also recommended a higher level of care then home and I feel it is important that he be evaluated in the office next week to make sure he is continuing to improve. Will also benefit from blood pressure check.

## 2020-06-15 ENCOUNTER — Telehealth: Payer: Self-pay

## 2020-06-15 DIAGNOSIS — E1122 Type 2 diabetes mellitus with diabetic chronic kidney disease: Secondary | ICD-10-CM | POA: Diagnosis not present

## 2020-06-15 DIAGNOSIS — I509 Heart failure, unspecified: Secondary | ICD-10-CM | POA: Diagnosis not present

## 2020-06-15 DIAGNOSIS — N182 Chronic kidney disease, stage 2 (mild): Secondary | ICD-10-CM | POA: Diagnosis not present

## 2020-06-15 DIAGNOSIS — E1151 Type 2 diabetes mellitus with diabetic peripheral angiopathy without gangrene: Secondary | ICD-10-CM | POA: Diagnosis not present

## 2020-06-15 DIAGNOSIS — I110 Hypertensive heart disease with heart failure: Secondary | ICD-10-CM | POA: Diagnosis not present

## 2020-06-15 DIAGNOSIS — I2510 Atherosclerotic heart disease of native coronary artery without angina pectoris: Secondary | ICD-10-CM | POA: Diagnosis not present

## 2020-06-15 DIAGNOSIS — K219 Gastro-esophageal reflux disease without esophagitis: Secondary | ICD-10-CM | POA: Diagnosis not present

## 2020-06-15 DIAGNOSIS — J9611 Chronic respiratory failure with hypoxia: Secondary | ICD-10-CM | POA: Diagnosis not present

## 2020-06-15 DIAGNOSIS — J449 Chronic obstructive pulmonary disease, unspecified: Secondary | ICD-10-CM | POA: Diagnosis not present

## 2020-06-15 DIAGNOSIS — J9612 Chronic respiratory failure with hypercapnia: Secondary | ICD-10-CM | POA: Diagnosis not present

## 2020-06-15 NOTE — Telephone Encounter (Signed)
Left VM for Alfred Carney, PT with [facility], giving verbal orders for [facility] PT 1x week for 9 weeks.

## 2020-06-15 NOTE — Telephone Encounter (Signed)
[facility]int[facility] with [facility] request verbal orders for [facility] PT 1 x a wk for 9 wks.

## 2020-06-15 NOTE — Telephone Encounter (Signed)
Ok for [facility] PT as requested

## 2020-06-15 NOTE — Telephone Encounter (Signed)
Patient called back in and was scheduled for 10/7.

## 2020-06-15 NOTE — Telephone Encounter (Signed)
Called patient to schedule follow up. LVM to call back.  

## 2020-06-16 DIAGNOSIS — J9612 Chronic respiratory failure with hypercapnia: Secondary | ICD-10-CM | POA: Diagnosis not present

## 2020-06-16 DIAGNOSIS — I110 Hypertensive heart disease with heart failure: Secondary | ICD-10-CM | POA: Diagnosis not present

## 2020-06-16 DIAGNOSIS — E1122 Type 2 diabetes mellitus with diabetic chronic kidney disease: Secondary | ICD-10-CM | POA: Diagnosis not present

## 2020-06-16 DIAGNOSIS — K219 Gastro-esophageal reflux disease without esophagitis: Secondary | ICD-10-CM | POA: Diagnosis not present

## 2020-06-16 DIAGNOSIS — N182 Chronic kidney disease, stage 2 (mild): Secondary | ICD-10-CM | POA: Diagnosis not present

## 2020-06-16 DIAGNOSIS — E1151 Type 2 diabetes mellitus with diabetic peripheral angiopathy without gangrene: Secondary | ICD-10-CM | POA: Diagnosis not present

## 2020-06-16 DIAGNOSIS — I509 Heart failure, unspecified: Secondary | ICD-10-CM | POA: Diagnosis not present

## 2020-06-16 DIAGNOSIS — J449 Chronic obstructive pulmonary disease, unspecified: Secondary | ICD-10-CM | POA: Diagnosis not present

## 2020-06-16 DIAGNOSIS — J9611 Chronic respiratory failure with hypoxia: Secondary | ICD-10-CM | POA: Diagnosis not present

## 2020-06-16 DIAGNOSIS — I2510 Atherosclerotic heart disease of native coronary artery without angina pectoris: Secondary | ICD-10-CM | POA: Diagnosis not present

## 2020-06-21 DIAGNOSIS — I2510 Atherosclerotic heart disease of native coronary artery without angina pectoris: Secondary | ICD-10-CM | POA: Diagnosis not present

## 2020-06-21 DIAGNOSIS — E1122 Type 2 diabetes mellitus with diabetic chronic kidney disease: Secondary | ICD-10-CM | POA: Diagnosis not present

## 2020-06-21 DIAGNOSIS — J9612 Chronic respiratory failure with hypercapnia: Secondary | ICD-10-CM | POA: Diagnosis not present

## 2020-06-21 DIAGNOSIS — E1151 Type 2 diabetes mellitus with diabetic peripheral angiopathy without gangrene: Secondary | ICD-10-CM | POA: Diagnosis not present

## 2020-06-21 DIAGNOSIS — I110 Hypertensive heart disease with heart failure: Secondary | ICD-10-CM | POA: Diagnosis not present

## 2020-06-21 DIAGNOSIS — J9611 Chronic respiratory failure with hypoxia: Secondary | ICD-10-CM | POA: Diagnosis not present

## 2020-06-21 DIAGNOSIS — N182 Chronic kidney disease, stage 2 (mild): Secondary | ICD-10-CM | POA: Diagnosis not present

## 2020-06-21 DIAGNOSIS — J449 Chronic obstructive pulmonary disease, unspecified: Secondary | ICD-10-CM | POA: Diagnosis not present

## 2020-06-21 DIAGNOSIS — K219 Gastro-esophageal reflux disease without esophagitis: Secondary | ICD-10-CM | POA: Diagnosis not present

## 2020-06-21 DIAGNOSIS — I509 Heart failure, unspecified: Secondary | ICD-10-CM | POA: Diagnosis not present

## 2020-06-22 ENCOUNTER — Encounter: Payer: Self-pay | Admitting: Family Medicine

## 2020-06-22 ENCOUNTER — Ambulatory Visit (INDEPENDENT_AMBULATORY_CARE_PROVIDER_SITE_OTHER): Payer: Medicare HMO | Admitting: Family Medicine

## 2020-06-22 ENCOUNTER — Other Ambulatory Visit: Payer: Self-pay

## 2020-06-22 VITALS — BP 110/70 | HR 77 | Temp 96.4°F | Ht 69.0 in | Wt 321.5 lb

## 2020-06-22 DIAGNOSIS — I6783 Posterior reversible encephalopathy syndrome: Secondary | ICD-10-CM

## 2020-06-22 DIAGNOSIS — R062 Wheezing: Secondary | ICD-10-CM | POA: Diagnosis not present

## 2020-06-22 DIAGNOSIS — I5032 Chronic diastolic (congestive) heart failure: Secondary | ICD-10-CM | POA: Insufficient documentation

## 2020-06-22 DIAGNOSIS — N182 Chronic kidney disease, stage 2 (mild): Secondary | ICD-10-CM

## 2020-06-22 DIAGNOSIS — J9612 Chronic respiratory failure with hypercapnia: Secondary | ICD-10-CM

## 2020-06-22 DIAGNOSIS — J449 Chronic obstructive pulmonary disease, unspecified: Secondary | ICD-10-CM | POA: Diagnosis not present

## 2020-06-22 DIAGNOSIS — I10 Essential (primary) hypertension: Secondary | ICD-10-CM | POA: Diagnosis not present

## 2020-06-22 DIAGNOSIS — E1122 Type 2 diabetes mellitus with diabetic chronic kidney disease: Secondary | ICD-10-CM

## 2020-06-22 DIAGNOSIS — J9611 Chronic respiratory failure with hypoxia: Secondary | ICD-10-CM

## 2020-06-22 LAB — BASIC METABOLIC PANEL
BUN: 24 mg/dL — ABNORMAL HIGH (ref 6–23)
CO2: 31 mEq/L (ref 19–32)
Calcium: 9.9 mg/dL (ref 8.4–10.5)
Chloride: 101 mEq/L (ref 96–112)
Creatinine, Ser: 1.45 mg/dL (ref 0.40–1.50)
GFR: 48.27 mL/min — ABNORMAL LOW (ref >60.00)
Glucose, Bld: 158 mg/dL — ABNORMAL HIGH (ref 70–99)
Potassium: 3.7 mEq/L (ref 3.5–5.1)
Sodium: 142 mEq/L (ref 135–145)

## 2020-06-22 LAB — MAGNESIUM: Magnesium: 1.8 mg/dL (ref 1.5–2.5)

## 2020-06-22 NOTE — Addendum Note (Signed)
Addended by: Waunita Schooner R on: 06/22/2020 03:18 PM   Modules accepted: Level of Service

## 2020-06-22 NOTE — Patient Instructions (Signed)
No medication changes  Send MyChart next week with home monitoring and what the physical therapist got  To check your blood pressure 1) Sit in a quiet and relaxed place for 5 minutes 2) Make sure your feet are flat on the ground 3) Consider checking first thing in the morning   Normal blood pressure is less than 140/90 Ideally you blood pressure should be around 120/80

## 2020-06-22 NOTE — Assessment & Plan Note (Signed)
Home CBG monitoring is doing well. Cont glipizide and dietary changes.

## 2020-06-22 NOTE — Progress Notes (Addendum)
Subjective:     Alfred Carney is a 69 y.o. male presenting for Hospitalization Follow-up (1 week )     HPI  #HTN - bp is elevated at home - therapist came yesterday and compared and it was high at home - 120-170/70-100 - weight 320-324 - not feeling dehydrated - no extra swelling - trying to do go on diet - has been getting a little dizzy - when he got of the car and walked towards the office  Wife reports legs less swollen then normal  Has neurology set up  Saw the eye doctor appointment with a cataract surgeon - late october   Review of Systems  9/25-9/28/2021: Admission - code stroke for vision/language loss - CT negative, EEG neg, MRI with possible posterior encephalopathy from HTN. Neuro - Strict BP control. Refused SNF. HTN - cont metoprolol/irbestartan, increase lasix 40 mg daily - Goal <140/90  Social History   Tobacco Use  Smoking Status Former Smoker  . Packs/day: 1.00  . Years: 35.00  . Pack years: 35.00  . Types: Cigarettes  . Quit date: 09/16/2006  . Years since quitting: 13.7  Smokeless Tobacco Never Used        Objective:    BP Readings from Last 3 Encounters:  06/22/20 110/70  06/13/20 (!) 139/94  02/07/20 134/78   Wt Readings from Last 3 Encounters:  06/22/20 (!) 321 lb 8 oz (145.8 kg)  06/10/20 (!) 331 lb 12.7 oz (150.5 kg)  02/07/20 (!) 329 lb 9.6 oz (149.5 kg)    BP 110/70   Pulse 77   Temp (!) 96.4 F (35.8 C) (Temporal)   Ht 5\' 9"  (1.753 m)   Wt (!) 321 lb 8 oz (145.8 kg)   SpO2 95%   BMI 47.48 kg/m    Physical Exam Constitutional:      Appearance: Normal appearance. He is obese. He is not ill-appearing or diaphoretic.  HENT:     Right Ear: External ear normal.     Left Ear: External ear normal.  Eyes:     General: No scleral icterus.    Extraocular Movements: Extraocular movements intact.     Conjunctiva/sclera: Conjunctivae normal.  Cardiovascular:     Rate and Rhythm: Normal rate and regular rhythm.     Heart  sounds: Heart sounds are distant.  Pulmonary:     Effort: Pulmonary effort is normal. No tachypnea or accessory muscle usage.     Breath sounds: Normal breath sounds.     Comments: On oxygen Musculoskeletal:     Cervical back: Neck supple.     Right lower leg: No edema.     Left lower leg: No edema.  Skin:    General: Skin is warm and dry.     Comments: Chronic venous stasis changes b/l LE  Neurological:     Mental Status: He is alert. Mental status is at baseline.  Psychiatric:        Mood and Affect: Mood normal.           Assessment & Plan:   Problem List Items Addressed This Visit      Cardiovascular and Mediastinum   Essential hypertension - Primary    BP low in the office, however, high at home. He will continue current medications but mychart next week with home monitoring. His lasix was increased in the hospital and no swelling on exam today. Labs to check electrolytes. He is monitoring daily weights which are decreasing but he is also working  on dietary changes. Cont metoprolol, irbesartan, lasix.       Relevant Orders   Basic metabolic panel   Magnesium   Chronic diastolic heart failure (HCC)    Grade 1 on recent ECHO. Cont lasix. Appears euvolemic today.         Respiratory   Chronic respiratory failure with hypoxia and hypercapnia (HCC)    Stable. Cont oxygen support.         Endocrine   Type 2 diabetes mellitus with stage 2 chronic kidney disease, without long-term current use of insulin ([place])    Home CBG monitoring is doing well. Cont glipizide and dietary changes.         Nervous and Auditory   Posterior reversible encephalopathy syndrome    Improving following recent hospital stay. Doing well with home PT. Has ophthalmology and neurology f/u w/in the month. His bp in the office is good, but appears elevated at home. He will message next week with home monitoring and HH PT BP check as well to see if cuff error.         Reconciled medication  from the hospital stay. Pt notes that they told him to stop his dandruff shampoo. Told him it is Ok to resume.   Return in about 5 weeks (around 07/27/2020) for scheduled annual.  Lesleigh Noe, MD  This visit occurred during the SARS-CoV-2 public health emergency.  Safety protocols were in place, including screening questions prior to the visit, additional usage of staff PPE, and extensive cleaning of exam room while observing appropriate contact time as indicated for disinfecting solutions.

## 2020-06-22 NOTE — Assessment & Plan Note (Signed)
Grade 1 on recent ECHO. Cont lasix. Appears euvolemic today.

## 2020-06-22 NOTE — Assessment & Plan Note (Signed)
BP low in the office, however, high at home. He will continue current medications but mychart next week with home monitoring. His lasix was increased in the hospital and no swelling on exam today. Labs to check electrolytes. He is monitoring daily weights which are decreasing but he is also working on dietary changes. Cont metoprolol, irbesartan, lasix.

## 2020-06-22 NOTE — Assessment & Plan Note (Signed)
Improving following recent hospital stay. Doing well with home PT. Has ophthalmology and neurology f/u w/in the month. His bp in the office is good, but appears elevated at home. He will message next week with home monitoring and HH PT BP check as well to see if cuff error.

## 2020-06-22 NOTE — Assessment & Plan Note (Signed)
Stable. Cont oxygen support.

## 2020-06-23 DIAGNOSIS — N182 Chronic kidney disease, stage 2 (mild): Secondary | ICD-10-CM | POA: Diagnosis not present

## 2020-06-23 DIAGNOSIS — E1151 Type 2 diabetes mellitus with diabetic peripheral angiopathy without gangrene: Secondary | ICD-10-CM | POA: Diagnosis not present

## 2020-06-23 DIAGNOSIS — J9612 Chronic respiratory failure with hypercapnia: Secondary | ICD-10-CM | POA: Diagnosis not present

## 2020-06-23 DIAGNOSIS — I509 Heart failure, unspecified: Secondary | ICD-10-CM | POA: Diagnosis not present

## 2020-06-23 DIAGNOSIS — J9611 Chronic respiratory failure with hypoxia: Secondary | ICD-10-CM | POA: Diagnosis not present

## 2020-06-23 DIAGNOSIS — I110 Hypertensive heart disease with heart failure: Secondary | ICD-10-CM | POA: Diagnosis not present

## 2020-06-23 DIAGNOSIS — J449 Chronic obstructive pulmonary disease, unspecified: Secondary | ICD-10-CM | POA: Diagnosis not present

## 2020-06-23 DIAGNOSIS — E1122 Type 2 diabetes mellitus with diabetic chronic kidney disease: Secondary | ICD-10-CM | POA: Diagnosis not present

## 2020-06-23 DIAGNOSIS — K219 Gastro-esophageal reflux disease without esophagitis: Secondary | ICD-10-CM | POA: Diagnosis not present

## 2020-06-23 DIAGNOSIS — I2510 Atherosclerotic heart disease of native coronary artery without angina pectoris: Secondary | ICD-10-CM | POA: Diagnosis not present

## 2020-06-26 DIAGNOSIS — K219 Gastro-esophageal reflux disease without esophagitis: Secondary | ICD-10-CM

## 2020-06-26 DIAGNOSIS — N182 Chronic kidney disease, stage 2 (mild): Secondary | ICD-10-CM

## 2020-06-26 DIAGNOSIS — E785 Hyperlipidemia, unspecified: Secondary | ICD-10-CM

## 2020-06-26 DIAGNOSIS — Z6841 Body Mass Index (BMI) 40.0 and over, adult: Secondary | ICD-10-CM

## 2020-06-26 DIAGNOSIS — G4733 Obstructive sleep apnea (adult) (pediatric): Secondary | ICD-10-CM

## 2020-06-26 DIAGNOSIS — I509 Heart failure, unspecified: Secondary | ICD-10-CM

## 2020-06-26 DIAGNOSIS — J9612 Chronic respiratory failure with hypercapnia: Secondary | ICD-10-CM

## 2020-06-26 DIAGNOSIS — K760 Fatty (change of) liver, not elsewhere classified: Secondary | ICD-10-CM

## 2020-06-26 DIAGNOSIS — Z86010 Personal history of colonic polyps: Secondary | ICD-10-CM

## 2020-06-26 DIAGNOSIS — R32 Unspecified urinary incontinence: Secondary | ICD-10-CM

## 2020-06-26 DIAGNOSIS — I2510 Atherosclerotic heart disease of native coronary artery without angina pectoris: Secondary | ICD-10-CM

## 2020-06-26 DIAGNOSIS — J9611 Chronic respiratory failure with hypoxia: Secondary | ICD-10-CM

## 2020-06-26 DIAGNOSIS — E1151 Type 2 diabetes mellitus with diabetic peripheral angiopathy without gangrene: Secondary | ICD-10-CM

## 2020-06-26 DIAGNOSIS — Z7984 Long term (current) use of oral hypoglycemic drugs: Secondary | ICD-10-CM

## 2020-06-26 DIAGNOSIS — Z9981 Dependence on supplemental oxygen: Secondary | ICD-10-CM

## 2020-06-26 DIAGNOSIS — I110 Hypertensive heart disease with heart failure: Secondary | ICD-10-CM

## 2020-06-26 DIAGNOSIS — E1122 Type 2 diabetes mellitus with diabetic chronic kidney disease: Secondary | ICD-10-CM

## 2020-06-26 DIAGNOSIS — N401 Enlarged prostate with lower urinary tract symptoms: Secondary | ICD-10-CM

## 2020-06-26 DIAGNOSIS — J449 Chronic obstructive pulmonary disease, unspecified: Secondary | ICD-10-CM

## 2020-06-28 DIAGNOSIS — J9611 Chronic respiratory failure with hypoxia: Secondary | ICD-10-CM | POA: Diagnosis not present

## 2020-06-28 DIAGNOSIS — I509 Heart failure, unspecified: Secondary | ICD-10-CM | POA: Diagnosis not present

## 2020-06-28 DIAGNOSIS — N182 Chronic kidney disease, stage 2 (mild): Secondary | ICD-10-CM | POA: Diagnosis not present

## 2020-06-28 DIAGNOSIS — E1151 Type 2 diabetes mellitus with diabetic peripheral angiopathy without gangrene: Secondary | ICD-10-CM | POA: Diagnosis not present

## 2020-06-28 DIAGNOSIS — I2510 Atherosclerotic heart disease of native coronary artery without angina pectoris: Secondary | ICD-10-CM | POA: Diagnosis not present

## 2020-06-28 DIAGNOSIS — I110 Hypertensive heart disease with heart failure: Secondary | ICD-10-CM | POA: Diagnosis not present

## 2020-06-28 DIAGNOSIS — E1122 Type 2 diabetes mellitus with diabetic chronic kidney disease: Secondary | ICD-10-CM | POA: Diagnosis not present

## 2020-06-28 DIAGNOSIS — J449 Chronic obstructive pulmonary disease, unspecified: Secondary | ICD-10-CM | POA: Diagnosis not present

## 2020-06-28 DIAGNOSIS — J9612 Chronic respiratory failure with hypercapnia: Secondary | ICD-10-CM | POA: Diagnosis not present

## 2020-06-28 DIAGNOSIS — K219 Gastro-esophageal reflux disease without esophagitis: Secondary | ICD-10-CM | POA: Diagnosis not present

## 2020-06-30 DIAGNOSIS — I110 Hypertensive heart disease with heart failure: Secondary | ICD-10-CM | POA: Diagnosis not present

## 2020-06-30 DIAGNOSIS — E1122 Type 2 diabetes mellitus with diabetic chronic kidney disease: Secondary | ICD-10-CM | POA: Diagnosis not present

## 2020-06-30 DIAGNOSIS — J9612 Chronic respiratory failure with hypercapnia: Secondary | ICD-10-CM | POA: Diagnosis not present

## 2020-06-30 DIAGNOSIS — I509 Heart failure, unspecified: Secondary | ICD-10-CM | POA: Diagnosis not present

## 2020-06-30 DIAGNOSIS — E1151 Type 2 diabetes mellitus with diabetic peripheral angiopathy without gangrene: Secondary | ICD-10-CM | POA: Diagnosis not present

## 2020-06-30 DIAGNOSIS — I2510 Atherosclerotic heart disease of native coronary artery without angina pectoris: Secondary | ICD-10-CM | POA: Diagnosis not present

## 2020-06-30 DIAGNOSIS — J9611 Chronic respiratory failure with hypoxia: Secondary | ICD-10-CM | POA: Diagnosis not present

## 2020-06-30 DIAGNOSIS — N182 Chronic kidney disease, stage 2 (mild): Secondary | ICD-10-CM | POA: Diagnosis not present

## 2020-06-30 DIAGNOSIS — J449 Chronic obstructive pulmonary disease, unspecified: Secondary | ICD-10-CM | POA: Diagnosis not present

## 2020-06-30 DIAGNOSIS — K219 Gastro-esophageal reflux disease without esophagitis: Secondary | ICD-10-CM | POA: Diagnosis not present

## 2020-07-03 DIAGNOSIS — I509 Heart failure, unspecified: Secondary | ICD-10-CM | POA: Diagnosis not present

## 2020-07-03 DIAGNOSIS — J9611 Chronic respiratory failure with hypoxia: Secondary | ICD-10-CM | POA: Diagnosis not present

## 2020-07-03 DIAGNOSIS — K219 Gastro-esophageal reflux disease without esophagitis: Secondary | ICD-10-CM | POA: Diagnosis not present

## 2020-07-03 DIAGNOSIS — I110 Hypertensive heart disease with heart failure: Secondary | ICD-10-CM | POA: Diagnosis not present

## 2020-07-03 DIAGNOSIS — E1122 Type 2 diabetes mellitus with diabetic chronic kidney disease: Secondary | ICD-10-CM | POA: Diagnosis not present

## 2020-07-03 DIAGNOSIS — J9612 Chronic respiratory failure with hypercapnia: Secondary | ICD-10-CM | POA: Diagnosis not present

## 2020-07-03 DIAGNOSIS — E1151 Type 2 diabetes mellitus with diabetic peripheral angiopathy without gangrene: Secondary | ICD-10-CM | POA: Diagnosis not present

## 2020-07-03 DIAGNOSIS — J449 Chronic obstructive pulmonary disease, unspecified: Secondary | ICD-10-CM | POA: Diagnosis not present

## 2020-07-03 DIAGNOSIS — I2510 Atherosclerotic heart disease of native coronary artery without angina pectoris: Secondary | ICD-10-CM | POA: Diagnosis not present

## 2020-07-03 DIAGNOSIS — N182 Chronic kidney disease, stage 2 (mild): Secondary | ICD-10-CM | POA: Diagnosis not present

## 2020-07-05 DIAGNOSIS — I110 Hypertensive heart disease with heart failure: Secondary | ICD-10-CM | POA: Diagnosis not present

## 2020-07-05 DIAGNOSIS — I509 Heart failure, unspecified: Secondary | ICD-10-CM | POA: Diagnosis not present

## 2020-07-05 DIAGNOSIS — I2510 Atherosclerotic heart disease of native coronary artery without angina pectoris: Secondary | ICD-10-CM | POA: Diagnosis not present

## 2020-07-05 DIAGNOSIS — J9611 Chronic respiratory failure with hypoxia: Secondary | ICD-10-CM | POA: Diagnosis not present

## 2020-07-05 DIAGNOSIS — E1122 Type 2 diabetes mellitus with diabetic chronic kidney disease: Secondary | ICD-10-CM | POA: Diagnosis not present

## 2020-07-05 DIAGNOSIS — E1151 Type 2 diabetes mellitus with diabetic peripheral angiopathy without gangrene: Secondary | ICD-10-CM | POA: Diagnosis not present

## 2020-07-05 DIAGNOSIS — K219 Gastro-esophageal reflux disease without esophagitis: Secondary | ICD-10-CM | POA: Diagnosis not present

## 2020-07-05 DIAGNOSIS — N182 Chronic kidney disease, stage 2 (mild): Secondary | ICD-10-CM | POA: Diagnosis not present

## 2020-07-05 DIAGNOSIS — J449 Chronic obstructive pulmonary disease, unspecified: Secondary | ICD-10-CM | POA: Diagnosis not present

## 2020-07-05 DIAGNOSIS — J9612 Chronic respiratory failure with hypercapnia: Secondary | ICD-10-CM | POA: Diagnosis not present

## 2020-07-10 ENCOUNTER — Telehealth: Payer: Self-pay

## 2020-07-10 DIAGNOSIS — J449 Chronic obstructive pulmonary disease, unspecified: Secondary | ICD-10-CM | POA: Diagnosis not present

## 2020-07-10 DIAGNOSIS — K219 Gastro-esophageal reflux disease without esophagitis: Secondary | ICD-10-CM | POA: Diagnosis not present

## 2020-07-10 DIAGNOSIS — N182 Chronic kidney disease, stage 2 (mild): Secondary | ICD-10-CM | POA: Diagnosis not present

## 2020-07-10 DIAGNOSIS — I2510 Atherosclerotic heart disease of native coronary artery without angina pectoris: Secondary | ICD-10-CM | POA: Diagnosis not present

## 2020-07-10 DIAGNOSIS — E1122 Type 2 diabetes mellitus with diabetic chronic kidney disease: Secondary | ICD-10-CM | POA: Diagnosis not present

## 2020-07-10 DIAGNOSIS — J9611 Chronic respiratory failure with hypoxia: Secondary | ICD-10-CM | POA: Diagnosis not present

## 2020-07-10 DIAGNOSIS — E1151 Type 2 diabetes mellitus with diabetic peripheral angiopathy without gangrene: Secondary | ICD-10-CM | POA: Diagnosis not present

## 2020-07-10 DIAGNOSIS — I509 Heart failure, unspecified: Secondary | ICD-10-CM | POA: Diagnosis not present

## 2020-07-10 DIAGNOSIS — I110 Hypertensive heart disease with heart failure: Secondary | ICD-10-CM | POA: Diagnosis not present

## 2020-07-10 DIAGNOSIS — J9612 Chronic respiratory failure with hypercapnia: Secondary | ICD-10-CM | POA: Diagnosis not present

## 2020-07-10 NOTE — Telephone Encounter (Signed)
Please get more information from patient.  1) what is his blood pressure running?  2) what has his weight been over the last week? Is he losing weight, gaining, or remaining stable?  3) does he feel dehydrated or notice less swelling then normal?

## 2020-07-10 NOTE — Telephone Encounter (Signed)
Spoke to pt and he gave me these answers. 1. His systolics range from low 761 to 950     Diastolic ranges from low 93'O to 99  2. 10/18: 319      Today: 325  3. Not feeling dehydrated and has noticed less swelling.

## 2020-07-10 NOTE — Telephone Encounter (Addendum)
Vm from pt wanting advise from Dr. Einar Pheasant.  States he was told to increased furosemide from 20 mg daily to 40 mg daily after d/c from hospital.  Pt wants to know what Dr. Einar Pheasant thinks about increasing the dose.  Plz advise.

## 2020-07-11 DIAGNOSIS — H18413 Arcus senilis, bilateral: Secondary | ICD-10-CM | POA: Diagnosis not present

## 2020-07-11 DIAGNOSIS — H2512 Age-related nuclear cataract, left eye: Secondary | ICD-10-CM | POA: Diagnosis not present

## 2020-07-11 DIAGNOSIS — H25013 Cortical age-related cataract, bilateral: Secondary | ICD-10-CM | POA: Diagnosis not present

## 2020-07-11 DIAGNOSIS — H25043 Posterior subcapsular polar age-related cataract, bilateral: Secondary | ICD-10-CM | POA: Diagnosis not present

## 2020-07-11 DIAGNOSIS — H2513 Age-related nuclear cataract, bilateral: Secondary | ICD-10-CM | POA: Diagnosis not present

## 2020-07-11 NOTE — Telephone Encounter (Signed)
Would recommend continuing higher dose of furosemide.

## 2020-07-12 NOTE — Telephone Encounter (Signed)
Spoke to Alfred Carney and relayed Dr. Verda Cumins recommendation to continue the 40 mg dose. Alfred Carney stated understanding.

## 2020-07-14 DIAGNOSIS — E1122 Type 2 diabetes mellitus with diabetic chronic kidney disease: Secondary | ICD-10-CM | POA: Diagnosis not present

## 2020-07-14 DIAGNOSIS — I509 Heart failure, unspecified: Secondary | ICD-10-CM | POA: Diagnosis not present

## 2020-07-14 DIAGNOSIS — K219 Gastro-esophageal reflux disease without esophagitis: Secondary | ICD-10-CM | POA: Diagnosis not present

## 2020-07-14 DIAGNOSIS — E1151 Type 2 diabetes mellitus with diabetic peripheral angiopathy without gangrene: Secondary | ICD-10-CM | POA: Diagnosis not present

## 2020-07-14 DIAGNOSIS — I2510 Atherosclerotic heart disease of native coronary artery without angina pectoris: Secondary | ICD-10-CM | POA: Diagnosis not present

## 2020-07-14 DIAGNOSIS — I110 Hypertensive heart disease with heart failure: Secondary | ICD-10-CM | POA: Diagnosis not present

## 2020-07-14 DIAGNOSIS — J9612 Chronic respiratory failure with hypercapnia: Secondary | ICD-10-CM | POA: Diagnosis not present

## 2020-07-14 DIAGNOSIS — J449 Chronic obstructive pulmonary disease, unspecified: Secondary | ICD-10-CM | POA: Diagnosis not present

## 2020-07-14 DIAGNOSIS — J9611 Chronic respiratory failure with hypoxia: Secondary | ICD-10-CM | POA: Diagnosis not present

## 2020-07-14 DIAGNOSIS — N182 Chronic kidney disease, stage 2 (mild): Secondary | ICD-10-CM | POA: Diagnosis not present

## 2020-07-15 DIAGNOSIS — E1151 Type 2 diabetes mellitus with diabetic peripheral angiopathy without gangrene: Secondary | ICD-10-CM | POA: Diagnosis not present

## 2020-07-15 DIAGNOSIS — I110 Hypertensive heart disease with heart failure: Secondary | ICD-10-CM | POA: Diagnosis not present

## 2020-07-15 DIAGNOSIS — K219 Gastro-esophageal reflux disease without esophagitis: Secondary | ICD-10-CM | POA: Diagnosis not present

## 2020-07-15 DIAGNOSIS — J9611 Chronic respiratory failure with hypoxia: Secondary | ICD-10-CM | POA: Diagnosis not present

## 2020-07-15 DIAGNOSIS — I509 Heart failure, unspecified: Secondary | ICD-10-CM | POA: Diagnosis not present

## 2020-07-15 DIAGNOSIS — J449 Chronic obstructive pulmonary disease, unspecified: Secondary | ICD-10-CM | POA: Diagnosis not present

## 2020-07-15 DIAGNOSIS — N182 Chronic kidney disease, stage 2 (mild): Secondary | ICD-10-CM | POA: Diagnosis not present

## 2020-07-15 DIAGNOSIS — E1122 Type 2 diabetes mellitus with diabetic chronic kidney disease: Secondary | ICD-10-CM | POA: Diagnosis not present

## 2020-07-15 DIAGNOSIS — J9612 Chronic respiratory failure with hypercapnia: Secondary | ICD-10-CM | POA: Diagnosis not present

## 2020-07-15 DIAGNOSIS — I2510 Atherosclerotic heart disease of native coronary artery without angina pectoris: Secondary | ICD-10-CM | POA: Diagnosis not present

## 2020-07-16 ENCOUNTER — Other Ambulatory Visit: Payer: Self-pay | Admitting: Family Medicine

## 2020-07-19 DIAGNOSIS — I110 Hypertensive heart disease with heart failure: Secondary | ICD-10-CM | POA: Diagnosis not present

## 2020-07-19 DIAGNOSIS — E1151 Type 2 diabetes mellitus with diabetic peripheral angiopathy without gangrene: Secondary | ICD-10-CM | POA: Diagnosis not present

## 2020-07-19 DIAGNOSIS — I2510 Atherosclerotic heart disease of native coronary artery without angina pectoris: Secondary | ICD-10-CM | POA: Diagnosis not present

## 2020-07-19 DIAGNOSIS — J9611 Chronic respiratory failure with hypoxia: Secondary | ICD-10-CM | POA: Diagnosis not present

## 2020-07-19 DIAGNOSIS — K219 Gastro-esophageal reflux disease without esophagitis: Secondary | ICD-10-CM | POA: Diagnosis not present

## 2020-07-19 DIAGNOSIS — J449 Chronic obstructive pulmonary disease, unspecified: Secondary | ICD-10-CM | POA: Diagnosis not present

## 2020-07-19 DIAGNOSIS — J9612 Chronic respiratory failure with hypercapnia: Secondary | ICD-10-CM | POA: Diagnosis not present

## 2020-07-19 DIAGNOSIS — N182 Chronic kidney disease, stage 2 (mild): Secondary | ICD-10-CM | POA: Diagnosis not present

## 2020-07-19 DIAGNOSIS — E1122 Type 2 diabetes mellitus with diabetic chronic kidney disease: Secondary | ICD-10-CM | POA: Diagnosis not present

## 2020-07-19 DIAGNOSIS — I509 Heart failure, unspecified: Secondary | ICD-10-CM | POA: Diagnosis not present

## 2020-07-20 ENCOUNTER — Ambulatory Visit: Payer: Medicare HMO | Admitting: Neurology

## 2020-07-20 ENCOUNTER — Telehealth: Payer: Self-pay | Admitting: Neurology

## 2020-07-20 ENCOUNTER — Encounter: Payer: Self-pay | Admitting: Neurology

## 2020-07-20 ENCOUNTER — Other Ambulatory Visit: Payer: Self-pay

## 2020-07-20 VITALS — BP 105/57 | HR 69 | Ht 69.0 in | Wt 321.0 lb

## 2020-07-20 DIAGNOSIS — H539 Unspecified visual disturbance: Secondary | ICD-10-CM | POA: Diagnosis not present

## 2020-07-20 DIAGNOSIS — I6783 Posterior reversible encephalopathy syndrome: Secondary | ICD-10-CM

## 2020-07-20 NOTE — Telephone Encounter (Signed)
[employer] medicare order sent to [facility]. They will obtain the auth and reach out to the patient to schedule.  °

## 2020-07-20 NOTE — Progress Notes (Signed)
[facility] [address] [place]. [place] [address] [phone]       OFFICE CONSULT NOTE  [name] Date of Birth:  Feb 08, 1951 Medical Record Number:  [id number]   Referring MD:  Theda Belfast  Reason for Referral: Posterior reversible encephalopathy syndrome HPI: [name] is a 69 year old Caucasian male seen today for initial office consultation visit.  History is obtained from the patient and his wife is accompanying her as well as review of electronic medical records.  I personally reviewed available pertinent imaging films in PACS.[name] is an 69 y.o. male with a PMHx of COPD, CAD, DM2, GERD, fatty liver, HLD, HTN, morbid obesity, s/p repair of thoracoabdominal aortic aneurysm and sleep apnea who presents to the ED from home via EMS as a Code Stroke. He was having a headache early this evening prior to taking a nap, but was otherwise at his baseline. On awakening at 11:30 PM, he complained to his wife that he could not see. His speech then rapidly became less intelligible. EMS was called and on arrival, the patient could only say "what" in response to questions and was not following commands. He did not seem to be agitated or in pain. En route his BP was 220/80 with CBG of 165. On arrival to the ED his BP had further increased to 240/114. He was completely noverbal on arrival to the ED but with eyes open. STAT CT head was obtained, which revealed no acute abnormality.  MRI scan however was highly suggestive of posterior reversible encephalopathy syndrome with T2 and FLAIR hyperintensities in the parieto-occipital cortex.  MRA of the brain was unremarkable.  Patient mental status improved as the blood pressure was gradually controlled but he had palinopsia and some vision dysfunction and speech also improved.  He was placed on long-term EEG monitoring which showed mild diffuse encephalopathy without definite epileptiform activity hence he was not started on  anticonvulsants.  Echocardiogram was unremarkable.  Carotid ultrasound showed no significant extracranial stenosis.  LDL cholesterol was 90 mg percent.  Patient states is done well since discharge.  Is been on antihypertensives blood pressure is well controlled in fact today it is low it is 105/57 today.  He is able to walk indoors without his assistance but uses a wheelchair for long distance as he gets short of breath because of his obesity.  He has been on home oxygen for his COPD for the last couple of years now.  He feels his mental status improved back to baseline and his visual dysfunction is also resolved.  He has no new complaints today.  He is scheduled to undergo cataract surgery in the first week of December.  He does have obstructive sleep apnea but the patient refuses to wear his CPAP as he feels he cannot tolerate it  ROS:   14 system review of systems is positive for confusion, memory loss, vision disturbance, shortness of breath, difficulty walking, snoring, sleep apnea and all other systems negative  PMH:  Past Medical History:  Diagnosis Date  . Chronic obstructive pulmonary disease ([facility]) 2008   Moderate  . Colon polyps   . Community acquired pneumonia   . Coronary artery disease   . Degenerative joint disease   . Diabetes mellitus without complication ([facility])    type 2  . Emphysema lung ([facility])   . Enlarged liver    fatty liver by '09 CT  . Gastroesophageal reflux disease   . Gout   . H/O hiatal hernia   .  History of Rocky [place] spotted fever    Possible history of [place] Spotted Fever  . Hyperlipidemia   . Hypertension   . Hypokalemia    diuretic induced, resolved  . Microcytic anemia    iron pills  . Morbid obesity ([facility])   . Shortness of breath   . Skin cancer    skin cancer lip removed  . Sleep apnea    not currently using CPAP 05/20/13  . 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    Social History:  Social History    Socioeconomic History  . Marital status: Married    Spouse name: Alfred Carney  . Number of children: 4  . Years of education: GED, ITT  . Highest education level: Not on file  Occupational History  . Occupation: architectural drawing-retired  Tobacco Use  . Smoking status: Former Smoker    Packs/day: 1.00    Years: 35.00    Pack years: 35.00    Types: Cigarettes    Quit date: 09/16/2006    Years since quitting: 13.8  . Smokeless tobacco: Never Used  Vaping Use  . Vaping Use: Never used  Substance and Sexual Activity  . Alcohol use: No    Alcohol/week: 0.0 standard drinks  . Drug use: No  . Sexual activity: Not Currently  Other Topics Concern  . Not on file  Social History Narrative   Lives with wife   Right Handed   Drinks 4-5 cups caffeine daily   Social Determinants of Health   Financial Resource Strain:   . Difficulty of Paying Living Expenses: Not on file  Food Insecurity:   . Worried About Charity fundraiser in the Last Year: Not on file  . Ran Out of Food in the Last Year: Not on file  Transportation Needs:   . Lack of Transportation (Medical): Not on file  . Lack of Transportation (Non-Medical): Not on file  Physical Activity:   . Days of Exercise per Week: Not on file  . Minutes of Exercise per Session: Not on file  Stress:   . Feeling of Stress : Not on file  Social Connections:   . Frequency of Communication with Friends and Family: Not on file  . Frequency of Social Gatherings with Friends and Family: Not on file  . Attends Religious Services: Not on file  . Active Member of Clubs or Organizations: Not on file  . Attends Archivist Meetings: Not on file  . Marital Status: Not on file  Intimate Partner Violence:   . Fear of Current or Ex-Partner: Not on file  . Emotionally Abused: Not on file  . Physically Abused: Not on file  . Sexually Abused: Not on file    Medications:   Current Outpatient Medications on File Prior to Visit  Medication  Sig Dispense Refill  . acetaminophen (TYLENOL) 325 MG tablet Take 2 tablets (650 mg total) by mouth every 6 (six) hours as needed for mild pain or moderate pain.    Marland Kitchen albuterol (PROVENTIL HFA;VENTOLIN HFA) 108 (90 Base) MCG/ACT inhaler Inhale 2 puffs into the lungs every 6 (six) hours as needed for wheezing or shortness of breath. 1 Inhaler 6  . Calcium Carbonate-Simethicone (ALKA-SELTZER HEARTBURN + GAS) 750-80 MG CHEW Chew 1 tablet by mouth as needed (gas).     . Cholecalciferol (VITAMIN D PO) Take 1 capsule by mouth daily.     . furosemide (LASIX) 20 MG tablet Take 2 tablets (40  mg total) by mouth daily. 90 tablet 0  . glipiZIDE (GLUCOTROL) 5 MG tablet Take 1 tablet (5 mg total) by mouth 2 (two) times daily before a meal. 180 tablet 2  . irbesartan (AVAPRO) 300 MG tablet Take 1 tablet (300 mg total) by mouth daily. 90 tablet 2  . metoprolol succinate (TOPROL-XL) 25 MG 24 hr tablet Take 1 tablet by mouth once daily (Patient taking differently: Take 25 mg by mouth daily. ) 90 tablet 3  . Multiple Vitamins-Minerals (MULTIVITAMIN WITH MINERALS) tablet Take 1 tablet by mouth daily.    . nitroGLYCERIN (NITROSTAT) 0.4 MG SL tablet Place 1 tablet (0.4 mg total) under the tongue every 5 (five) minutes x 3 doses as needed for chest pain. 25 tablet 0  . OXYGEN Inhale 2 L into the lungs continuous.     . pravastatin (PRAVACHOL) 20 MG tablet Take 1 tablet (20 mg total) by mouth daily. 90 tablet 3  . tamsulosin (FLOMAX) 0.4 MG CAPS capsule Take 1 capsule by mouth once daily at bedtime 90 capsule 3   No current facility-administered medications on file prior to visit.    Allergies:  No Known Allergies  Physical Exam General: Morbidly obese elderly Caucasian male.  He is on home oxygen.  Very okay of seated, in no evident distress Head: head normocephalic and atraumatic.   Neck: supple with no carotid or supraclavicular bruits Cardiovascular: regular rate and rhythm, no murmurs Musculoskeletal: no  deformity Skin:  no rash/petichiae Vascular:  Normal pulses all extremities  Neurologic Exam Mental Status: Awake and fully alert. Oriented to place and time. Recent and remote memory intact. Attention span, concentration and fund of knowledge appropriate. Mood and affect appropriate.  Recall 3/3. Cranial Nerves: Fundoscopic exam reveals sharp disc margins. Pupils equal, briskly reactive to light. Extraocular movements full without nystagmus. Visual fields full to confrontation. Hearing intact. Facial sensation intact. Face, tongue, palate moves normally and symmetrically.  Motor: Normal bulk and tone. Normal strength in all tested extremity muscles. Sensory.: intact to touch , pinprick , position and vibratory sensation.  Coordination: Rapid alternating movements normal in all extremities. Finger-to-nose and heel-to-shin performed accurately bilaterally. Gait and Station: Deferred as patient is in a wheelchair and on oxygen. Reflexes: 1+ and symmetric. Toes downgoing.   NIHSS  0 Modified Rankin 1   ASSESSMENT: 69 year old Caucasian male with transient episode of confusion, memory loss and vision disturbance due to posterior reversible encephalopathy syndrome in September 2021.     PLAN: I had a long discussion with the patient and his wife regarding his transient episode of confusion, memory loss and vision disturbance in September 2021 from posterior reversible encephalopathy syndrome.  I recommend strict control of hypertension with blood pressure goal below 130/90.  Repeat MRI scan of the brain with and without contrast to look for resolution of changes as well as EEG to look for seizure activity.  He will return for follow-up in the future in 3 months with my nurse practitioner Janett Billow or call earlier if necessary.  Greater than 50% time during this 45-minute consultation visit was spent on counseling and coordination of care about posterior reversible encephalopathy syndrome, confusion  and vision disturbance and answering questions. Antony Contras, MD Note: This document was prepared with digital dictation and possible smart phrase technology. Any transcriptional errors that result from this process are unintentional.

## 2020-07-20 NOTE — Patient Instructions (Signed)
I had a long discussion with the patient and his wife regarding his transient episode of confusion, memory loss and vision disturbance in September 2021 from posterior reversible encephalopathy syndrome.  I recommend strict control of hypertension with blood pressure goal below 130/90.  Repeat MRI scan of the brain with and without contrast to look for resolution of changes as well as EEG to look for seizure activity.  He will return for follow-up in the future in 3 months with my nurse practitioner Janett Billow or call earlier if necessary.

## 2020-07-21 ENCOUNTER — Ambulatory Visit
Admission: RE | Admit: 2020-07-21 | Discharge: 2020-07-21 | Disposition: A | Discharge status: Home / Self Care | Payer: Medicare HMO | Source: Ambulatory Visit | Attending: Neurology | Admitting: Neurology

## 2020-07-21 ENCOUNTER — Other Ambulatory Visit: Payer: Self-pay

## 2020-07-21 DIAGNOSIS — I6783 Posterior reversible encephalopathy syndrome: Secondary | ICD-10-CM | POA: Diagnosis not present

## 2020-07-21 DIAGNOSIS — H539 Unspecified visual disturbance: Secondary | ICD-10-CM | POA: Diagnosis not present

## 2020-07-21 MED ORDER — GADOBENATE DIMEGLUMINE 529 MG/ML IV SOLN
20.0000 mL | Freq: Once | INTRAVENOUS | Status: AC | PRN
  Administered 2020-07-21: 20 mL via INTRAVENOUS

## 2020-07-23 DIAGNOSIS — J449 Chronic obstructive pulmonary disease, unspecified: Secondary | ICD-10-CM | POA: Diagnosis not present

## 2020-07-23 DIAGNOSIS — R062 Wheezing: Secondary | ICD-10-CM | POA: Diagnosis not present

## 2020-07-25 ENCOUNTER — Ambulatory Visit (INDEPENDENT_AMBULATORY_CARE_PROVIDER_SITE_OTHER): Payer: Medicare HMO

## 2020-07-25 DIAGNOSIS — Z0000 Encounter for general adult medical examination without abnormal findings: Secondary | ICD-10-CM

## 2020-07-25 NOTE — Progress Notes (Signed)
PCP notes:  Health Maintenance: Flu- declined Tdap- insurance/financial    Abnormal Screenings: none   Patient concerns: none   Nurse concerns: none   Next PCP appt.: 08/01/2020 @ 11 am

## 2020-07-25 NOTE — Patient Instructions (Signed)
[name] , Thank you for taking time to come for your Medicare Wellness Visit. I appreciate your ongoing commitment to your health goals. Please review the following plan we discussed and let me know if I can assist you in the future.   Screening recommendations/referrals: Colonoscopy: Up to date, completed 01/22/2018, due 01/2028 Recommended yearly ophthalmology/optometry visit for glaucoma screening and checkup Recommended yearly dental visit for hygiene and checkup  Vaccinations: Influenza vaccine: declined Pneumococcal vaccine: Completed series Tdap vaccine: decline (insurance) Shingles vaccine: due, check with your insurance company regarding coverage/cost if interested   Covid-19: Completed series  Advanced directives: Advance directive discussed with you today. Even though you declined this today please call our office should you change your mind and we can give you the proper paperwork for you to fill out.   Conditions/risks identified: Diabetes, hypertension  Next appointment: Follow up in one year for your annual wellness visit.   Preventive Care 50 Years and Older, Male Preventive care refers to lifestyle choices and visits with your health care provider that can promote health and wellness. What does preventive care include?  A yearly physical exam. This is also called an annual well check.  Dental exams once or twice a year.  Routine eye exams. Ask your health care provider how often you should have your eyes checked.  Personal lifestyle choices, including:  Daily care of your teeth and gums.  Regular physical activity.  Eating a healthy diet.  Avoiding tobacco and drug use.  Limiting alcohol use.  Practicing safe sex.  Taking low doses of aspirin every day.  Taking vitamin and mineral supplements as recommended by your health care provider. What happens during an annual well check? The services and screenings done by your health care provider during  your annual well check will depend on your age, overall health, lifestyle risk factors, and family history of disease. Counseling  Your health care provider may ask you questions about your:  Alcohol use.  Tobacco use.  Drug use.  Emotional well-being.  Home and relationship well-being.  Sexual activity.  Eating habits.  History of falls.  Memory and ability to understand (cognition).  Work and work Statistician. Screening  You may have the following tests or measurements:  Height, weight, and BMI.  Blood pressure.  Lipid and cholesterol levels. These may be checked every 5 years, or more frequently if you are over 39 years old.  Skin check.  Lung cancer screening. You may have this screening every year starting at age 73 if you have a 30-pack-year history of smoking and currently smoke or have quit within the past 15 years.  Fecal occult blood test (FOBT) of the stool. You may have this test every year starting at age 49.  Flexible sigmoidoscopy or colonoscopy. You may have a sigmoidoscopy every 5 years or a colonoscopy every 10 years starting at age 29.  Prostate cancer screening. Recommendations will vary depending on your family history and other risks.  Hepatitis C blood test.  Hepatitis B blood test.  Sexually transmitted disease (STD) testing.  Diabetes screening. This is done by checking your blood sugar (glucose) after you have not eaten for a while (fasting). You may have this done every 1-3 years.  Abdominal aortic aneurysm (AAA) screening. You may need this if you are a current or former smoker.  Osteoporosis. You may be screened starting at age 73 if you are at high risk. Talk with your health care provider about your test results, treatment  options, and if necessary, the need for more tests. Vaccines  Your health care provider may recommend certain vaccines, such as:  Influenza vaccine. This is recommended every year.  Tetanus, diphtheria, and  acellular pertussis (Tdap, Td) vaccine. You may need a Td booster every 10 years.  Zoster vaccine. You may need this after age 65.  Pneumococcal 13-valent conjugate (PCV13) vaccine. One dose is recommended after age 2.  Pneumococcal polysaccharide (PPSV23) vaccine. One dose is recommended after age 49. Talk to your health care provider about which screenings and vaccines you need and how often you need them. This information is not intended to replace advice given to you by your health care provider. Make sure you discuss any questions you have with your health care provider. Document Released: 09/29/2015 Document Revised: 05/22/2016 Document Reviewed: 07/04/2015 Elsevier Interactive Patient Education  2017 [place] Prevention in the Home Falls can cause injuries. They can happen to people of all ages. There are many things you can do to make your home safe and to help prevent falls. What can I do on the outside of my home?  Regularly fix the edges of walkways and driveways and fix any cracks.  Remove anything that might make you trip as you walk through a door, such as a raised step or threshold.  Trim any bushes or trees on the path to your home.  Use bright outdoor lighting.  Clear any walking paths of anything that might make someone trip, such as rocks or tools.  Regularly check to see if handrails are loose or broken. Make sure that both sides of any steps have handrails.  Any raised decks and porches should have guardrails on the edges.  Have any leaves, snow, or ice cleared regularly.  Use sand or salt on walking paths during winter.  Clean up any spills in your garage right away. This includes oil or grease spills. What can I do in the bathroom?  Use night lights.  Install grab bars by the toilet and in the tub and shower. Do not use towel bars as grab bars.  Use non-skid mats or decals in the tub or shower.  If you need to sit down in the shower, use  a plastic, non-slip stool.  Keep the floor dry. Clean up any water that spills on the floor as soon as it happens.  Remove soap buildup in the tub or shower regularly.  Attach bath mats securely with double-sided non-slip rug tape.  Do not have throw rugs and other things on the floor that can make you trip. What can I do in the bedroom?  Use night lights.  Make sure that you have a light by your bed that is easy to reach.  Do not use any sheets or blankets that are too big for your bed. They should not hang down onto the floor.  Have a firm chair that has side arms. You can use this for support while you get dressed.  Do not have throw rugs and other things on the floor that can make you trip. What can I do in the kitchen?  Clean up any spills right away.  Avoid walking on wet floors.  Keep items that you use a lot in easy-to-reach places.  If you need to reach something above you, use a strong step stool that has a grab bar.  Keep electrical cords out of the way.  Do not use floor polish or wax that makes floors slippery.  If you must use wax, use non-skid floor wax.  Do not have throw rugs and other things on the floor that can make you trip. What can I do with my stairs?  Do not leave any items on the stairs.  Make sure that there are handrails on both sides of the stairs and use them. Fix handrails that are broken or loose. Make sure that handrails are as long as the stairways.  Check any carpeting to make sure that it is firmly attached to the stairs. Fix any carpet that is loose or worn.  Avoid having throw rugs at the top or bottom of the stairs. If you do have throw rugs, attach them to the floor with carpet tape.  Make sure that you have a light switch at the top of the stairs and the bottom of the stairs. If you do not have them, ask someone to add them for you. What else can I do to help prevent falls?  Wear shoes that:  Do not have high heels.  Have  rubber bottoms.  Are comfortable and fit you well.  Are closed at the toe. Do not wear sandals.  If you use a stepladder:  Make sure that it is fully opened. Do not climb a closed stepladder.  Make sure that both sides of the stepladder are locked into place.  Ask someone to hold it for you, if possible.  Clearly mark and make sure that you can see:  Any grab bars or handrails.  First and last steps.  Where the edge of each step is.  Use tools that help you move around (mobility aids) if they are needed. These include:  Canes.  Walkers.  Scooters.  Crutches.  Turn on the lights when you go into a dark area. Replace any light bulbs as soon as they burn out.  Set up your furniture so you have a clear path. Avoid moving your furniture around.  If any of your floors are uneven, fix them.  If there are any pets around you, be aware of where they are.  Review your medicines with your doctor. Some medicines can make you feel dizzy. This can increase your chance of falling. Ask your doctor what other things that you can do to help prevent falls. This information is not intended to replace advice given to you by your health care provider. Make sure you discuss any questions you have with your health care provider. Document Released: 06/29/2009 Document Revised: 02/08/2016 Document Reviewed: 10/07/2014 Elsevier Interactive Patient Education  2017 [employer].

## 2020-07-25 NOTE — Progress Notes (Signed)
Subjective:   Alfred Carney is a 69 y.o. male who presents for Medicare Annual/Subsequent preventive examination.  Review of Systems: N/A      I connected with the patient today by telephone and verified that I am speaking with the correct person using two identifiers. Location patient: home Location nurse: work Persons participating in the telephone visit: patient, nurse.   I discussed the limitations, risks, security and privacy concerns of performing an evaluation and management service by telephone and the availability of in person appointments. I also discussed with the patient that there may be a patient responsible charge related to this service. The patient expressed understanding and verbally consented to this telephonic visit.        Cardiac Risk Factors include: advanced age (>18men, >11 women);diabetes mellitus;male gender;hypertension     Objective:    Today's Vitals   07/25/20 1157  PainSc: 7    There is no height or weight on file to calculate BMI.  Advanced Directives 07/25/2020 10/30/2018 10/22/2018 01/22/2018 01/21/2018 01/20/2018 12/30/2017  Does Patient Have a Medical Advance Directive? No No No No No No Yes  Type of Advance Directive - - - - - - Therapist, sports in Chart? - - - - No - copy requested - -  Would patient like information on creating a medical advance directive? No - Patient declined No - Patient declined No - Patient declined Yes (MAU/Ambulatory/Procedural Areas - Information given) No - Patient declined No - Patient declined -  Pre-existing out of facility DNR order (yellow form or pink MOST form) - - - - - - -    Current Medications (verified) Outpatient Encounter Medications as of 07/25/2020  Medication Sig  . acetaminophen (TYLENOL) 325 MG tablet Take 2 tablets (650 mg total) by mouth every 6 (six) hours as needed for mild pain or moderate pain.  Marland Kitchen albuterol (PROVENTIL HFA;VENTOLIN HFA) 108 (90 Base)  MCG/ACT inhaler Inhale 2 puffs into the lungs every 6 (six) hours as needed for wheezing or shortness of breath.  . Calcium Carbonate-Simethicone (ALKA-SELTZER HEARTBURN + GAS) 750-80 MG CHEW Chew 1 tablet by mouth as needed (gas).   . Cholecalciferol (VITAMIN D PO) Take 1 capsule by mouth daily.   . furosemide (LASIX) 20 MG tablet Take 2 tablets (40 mg total) by mouth daily.  Marland Kitchen glipiZIDE (GLUCOTROL) 5 MG tablet Take 1 tablet (5 mg total) by mouth 2 (two) times daily before a meal.  . irbesartan (AVAPRO) 300 MG tablet Take 1 tablet (300 mg total) by mouth daily.  . metoprolol succinate (TOPROL-XL) 25 MG 24 hr tablet Take 1 tablet by mouth once daily (Patient taking differently: Take 25 mg by mouth daily. )  . Multiple Vitamins-Minerals (MULTIVITAMIN WITH MINERALS) tablet Take 1 tablet by mouth daily.  . nitroGLYCERIN (NITROSTAT) 0.4 MG SL tablet Place 1 tablet (0.4 mg total) under the tongue every 5 (five) minutes x 3 doses as needed for chest pain.  . OXYGEN Inhale 2 L into the lungs continuous.   . pravastatin (PRAVACHOL) 20 MG tablet Take 1 tablet (20 mg total) by mouth daily.  . tamsulosin (FLOMAX) 0.4 MG CAPS capsule Take 1 capsule by mouth once daily at bedtime   No facility-administered encounter medications on file as of 07/25/2020.    Allergies (verified) Patient has no known allergies.   History: Past Medical History:  Diagnosis Date  . Chronic obstructive pulmonary disease ([place]) 2008   Moderate  .  Colon polyps   . Community acquired pneumonia   . Coronary artery disease   . Degenerative joint disease   . Diabetes mellitus without complication ([place])    type 2  . Emphysema lung ([place])   . Enlarged liver    fatty liver by '09 CT  . Gastroesophageal reflux disease   . Gout   . H/O hiatal hernia   . History of Rocky Mountain spotted fever    Possible history of [place] Spotted Fever  . Hyperlipidemia   . Hypertension   . Hypokalemia    diuretic induced, resolved    . Microcytic anemia    iron pills  . Morbid obesity ([place])   . Shortness of breath   . Skin cancer    skin cancer lip removed  . Sleep apnea    not currently using CPAP 05/20/13  . 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 CARDIAC CATHETERIZATION  2007   Ejection fraction is estimated at 60%  . CHOLECYSTECTOMY    . COLONOSCOPY WITH PROPOFOL N/A 01/22/2018   Procedure: COLONOSCOPY WITH PROPOFOL;  Surgeon: Mauri Pole, MD;  Location: [facility];  Service: Endoscopy;  Laterality: N/A;  . DG NERVE ROOT BLOCK LUMBAR-SACRAL EACH ADD. LEVEL  10/23/2018      . ESOPHAGOGASTRODUODENOSCOPY (EGD) WITH PROPOFOL N/A 01/22/2018   Procedure: ESOPHAGOGASTRODUODENOSCOPY (EGD) WITH PROPOFOL;  Surgeon: Mauri Pole, MD;  Location: [facility];  Service: Endoscopy;  Laterality: N/A;  . HARDWARE REMOVAL Left 05/26/2013   Procedure: HARDWARE REMOVAL;  Surgeon: Newt Minion, MD;  Location: [facility];  Service: Orthopedics;  Laterality: Left;  Left Total Hip Arthroplasty, Removal of Deep Hardware  . HIP SURGERY     Status post left hip surgery with bone grafting  . IR INJECT/THERA/INC NEEDLE/CATH/PLC EPI/LUMB/SAC W/IMG  10/23/2018  . LEFT HEART CATHETERIZATION WITH CORONARY ANGIOGRAM N/A 01/31/2014   Procedure: LEFT HEART CATHETERIZATION WITH CORONARY ANGIOGRAM;  Surgeon: Burnell Blanks, MD;  Location[facility] C[facility] LAB;  Service: Cardiovascular;  Laterality: N/A;  . LUMBAR LAMINECTOMY/DECOMPRESSION MICRODISCECTOMY Left 10/28/2018   Procedure: Left Lumbar three-four Extraforaminal microdiscectomy;  Surgeon: Jovita Gamma, MD;  Location: [facility];  Service: Neurosurgery;  Laterality: Left;  . THORACOABDOMINAL AORTIC ANEURYSM REPAIR     with right femoral and left iliac BPG and reimplantation of renal arteries.  . TONSILLECTOMY    . TONSILLECTOMY    . TOTAL HIP ARTHROPLASTY Left 05/26/2013   Procedure: TOTAL HIP  ARTHROPLASTY;  Surgeon: Newt Minion, MD;  Location: [facility];  Service: Orthopedics;  Laterality: Left;  Left Total Hip Arthroplasty, Removal Deep Hardware   Family History  Problem Relation Age of Onset  . Osteoarthritis Mother   . Diabetes Mother   . Pulmonary embolism Mother   . Heart disease Father        Coronary Artery Disease  . Stroke Father   . Multiple sclerosis Sister   . Factor V Leiden deficiency Sister   . Emphysema Sister   . Hyperlipidemia Brother   . Lung cancer Maternal Grandfather        smoked  . Heart attack Maternal Grandmother    Social History   Socioeconomic History  . Marital status: Married    Spouse name: Vaughan Basta  . Number of children: 4  . Years of education: GED, ITT  . Highest education level: Not on file  Occupational History  . Occupation: Health visitor  drawing-retired  Tobacco Use  . Smoking status: Former Smoker    Packs/day: 1.00    Years: 35.00    Pack years: 35.00    Types: Cigarettes    Quit date: 09/16/2006    Years since quitting: 13.8  . Smokeless tobacco: Never Used  Vaping Use  . Vaping Use: Never used  Substance and Sexual Activity  . Alcohol use: No    Alcohol/week: 0.0 standard drinks  . Drug use: No  . Sexual activity: Not Currently  Other Topics Concern  . Not on file  Social History Narrative   Lives with wife   Right Handed   Drinks 4-5 cups caffeine daily   Social Determinants of Health   Financial Resource Strain: Low Risk   . Difficulty of Paying Living Expenses: Not hard at all  Food Insecurity: No Food Insecurity  . Worried About Charity fundraiser in the Last Year: Never true  . Ran Out of Food in the Last Year: Never true  Transportation Needs: No Transportation Needs  . Lack of Transportation (Medical): No  . Lack of Transportation (Non-Medical): No  Physical Activity: Sufficiently Active  . Days of Exercise per Week: 7 days  . Minutes of Exercise per Session: 30 min  Stress: No Stress Concern  Present  . Feeling of Stress : Not at all  Social Connections:   . Frequency of Communication with Friends and Family: Not on file  . Frequency of Social Gatherings with Friends and Family: Not on file  . Attends Religious Services: Not on file  . Active Member of Clubs or Organizations: Not on file  . Attends Archivist Meetings: Not on file  . Marital Status: Not on file    Tobacco Counseling Counseling given: Not Answered   Clinical Intake:  Pre-visit preparation completed: Yes  Pain : 0-10 Pain Score: 7  Pain Type: Chronic pain Pain Location: Back Pain Descriptors / Indicators: Aching Pain Onset: More than a month ago Pain Frequency: Intermittent Pain Relieving Factors: ibuprofen  Pain Relieving Factors: ibuprofen  Nutritional Risks: None Diabetes: Yes CBG done?: No Did pt. bring in CBG monitor from home?: No  How often do you need to have someone help you when you read instructions, pamphlets, or other written materials from your doctor or pharmacy?: 1 - Never What is the last grade level you completed in school?: GED, some college  Diabetic: Yes Nutrition Risk Assessment:  Has the patient had any N/V/D within the last 2 months?  No  Does the patient have any non-healing wounds?  No  Has the patient had any unintentional weight loss or weight gain?  No   Diabetes:  Is the patient diabetic?  Yes  If diabetic, was a CBG obtained today?  No  Did the patient bring in their glucometer from home?  N/A, telephone visit  How often do you monitor your CBG's? 2-3 times a week.   Financial Strains and Diabetes Management:  Are you having any financial strains with the device, your supplies or your medication? No .  Does the patient want to be seen by Chronic Care Management for management of their diabetes?  No  Would the patient like to be referred to a Nutritionist or for Diabetic Management?  No   Diabetic Exams:  Diabetic Eye Exam: Completed  04/25/2020 Diabetic Foot Exam: Completed 10/21/2019   Interpreter Needed?: No  Information entered by :: CJohnson, LPN   Activities of Daily Living In your present  state of health, do you have any difficulty performing the following activities: 07/25/2020  Hearing? Y  Comment some hearing loss noted  Vision? N  Difficulty concentrating or making decisions? N  Walking or climbing stairs? N  Dressing or bathing? N  Doing errands, shopping? N  Preparing Food and eating ? N  Using the Toilet? N  In the past six months, have you accidently leaked urine? Y  Comment some leakage noted  Do you have problems with loss of bowel control? N  Managing your Medications? N  Managing your Finances? N  Housekeeping or managing your Housekeeping? N  Some recent data might be hidden    Patient Care Team: Lesleigh Noe, MD as PCP - General (Family Medicine) [place], Peter M, MD as PCP - Cardiology (Cardiology) [place], Peter M, MD as Consulting Physician (Cardiology) Tanda Rockers, MD as Consulting Physician (Pulmonary Disease)  Indicate any recent Medical Services you may have received from other than [facility] providers in the past year (date may be approximate).     Assessment:   This is a routine wellness examination for [place].  Hearing/Vision screen  Hearing Screening   125Hz  250Hz  500Hz  1000Hz  2000Hz  3000Hz  4000Hz  6000Hz  8000Hz   Right ear:           Left ear:           Vision Screening Comments: Patient gets annual eye exams   Dietary issues and exercise activities discussed: Current Exercise Habits: Home exercise routine, Type of exercise: stretching, Time (Minutes): 30, Frequency (Times/Week): 7, Weekly Exercise (Minutes/Week): 210, Intensity: Moderate, Exercise limited by: None identified  Goals    . Patient Stated     07/25/2018, I will continue to do my physical therapy exercises everyday for about 30 minutes.       Depression Screen PHQ 2/9 Scores 07/25/2020 06/22/2020 02/15/2019  01/20/2018 01/19/2015 07/19/2014 05/31/2014  PHQ - 2 Score 0 0 0 1 0 0 0  PHQ- 9 Score 0 - 3 7 - - -    Fall Risk Fall Risk  07/25/2020 01/20/2018 01/19/2015 07/19/2014 05/31/2014  Falls in the past year? 0 Yes No No No  Number falls in past yr: 0 2 or more - - -  Injury with Fall? 0 Yes - - -  Risk Factor Category  - High Fall Risk - - -  Risk for fall due to : Medication side effect History of fall(s) Impaired balance/gait;Medication side effect - -  Follow up Falls evaluation completed;Falls prevention discussed Falls evaluation completed;Education provided;Falls prevention discussed - - -    Any stairs in or around the home? Yes  If so, are there any without handrails? No  Home free of loose throw rugs in walkways, pet beds, electrical cords, etc? Yes  Adequate lighting in your home to reduce risk of falls? Yes   ASSISTIVE DEVICES UTILIZED TO PREVENT FALLS:  Life alert? No  Use of a cane, walker or w/c? Yes  Grab bars in the bathroom? No  Shower chair or bench in shower? No  Elevated toilet seat or a handicapped toilet? No   TIMED UP AND GO:  Was the test performed? N/A, telephonic visit.   Cognitive Function: MMSE - Mini Mental State Exam 07/25/2020  Orientation to time 5  Orientation to Place 5  Registration 3  Attention/ Calculation 5  Recall 3  Language- repeat 1       Mini Cog  Mini-Cog screen was completed. Maximum score is 22. A value  of 0 denotes this part of the MMSE was not completed or the patient failed this part of the Mini-Cog screening.  Immunizations Immunization History  Administered Date(s) Administered  . PFIZER SARS-COV-2 Vaccination 11/17/2019, 12/08/2019  . Pneumococcal Conjugate-13 01/24/2015  . Pneumococcal Polysaccharide-23 09/18/2011, 11/12/2016  . Tdap 06/26/2010    TDAP status: Due, Education has been provided regarding the importance of this vaccine. Advised may receive this vaccine at local pharmacy or Health Dept. Aware to provide a copy of  the vaccination record if obtained from local pharmacy or Health Dept. Verbalized acceptance and understanding. Flu Vaccine status: Declined, Education has been provided regarding the importance of this vaccine but patient still declined. Advised may receive this vaccine at local pharmacy or Health Dept. Aware to provide a copy of the vaccination record if obtained from local pharmacy or Health Dept. Verbalized acceptance and understanding. Pneumococcal vaccine status: Up to date Covid-19 vaccine status: Completed vaccines  Qualifies for Shingles Vaccine? Yes   Zostavax completed No   Shingrix Completed?: No.    Education has been provided regarding the importance of this vaccine. Patient has been advised to call insurance company to determine out of pocket expense if they have not yet received this vaccine. Advised may also receive vaccine at local pharmacy or Health Dept. Verbalized acceptance and understanding.  Screening Tests Health Maintenance  Topic Date Due  . INFLUENZA VACCINE  12/14/2020 (Originally 04/16/2020)  . TETANUS/TDAP  07/25/2024 (Originally 06/26/2020)  . FOOT EXAM  10/20/2020  . HEMOGLOBIN A1C  12/08/2020  . OPHTHALMOLOGY EXAM  04/25/2021  . COLONOSCOPY  01/23/2028  . COVID-19 Vaccine  Completed  . Hepatitis C Screening  Completed  . PNA vac Low Risk Adult  Completed    Health Maintenance  There are no preventive care reminders to display for this patient.  Colorectal cancer screening: Completed colonoscopy 01/22/2018. Repeat every 10 years  Lung Cancer Screening: (Low Dose CT Chest recommended if Age 62-80 years, 30 pack-year currently smoking OR have quit w/in 15years.) does not qualify.    Additional Screening:  Hepatitis C Screening: does qualify; Completed 11/04/2018  Vision Screening: Recommended annual ophthalmology exams for early detection of glaucoma and other disorders of the eye. Is the patient up to date with their annual eye exam?  Yes  Who is the  provider or what is the name of the office in which the patient attends annual eye exams? Dr. Einar Gip, Dr. Tommy Rainwater If pt is not established with a provider, would they like to be referred to a provider to establish care? No .   Dental Screening: Recommended annual dental exams for proper oral hygiene  Community Resource Referral / Chronic Care Management: CRR required this visit?  No   CCM required this visit?  No      Plan:     I have personally reviewed and noted the following in the patient's chart:   . Medical and social history . Use of alcohol, tobacco or illicit drugs  . Current medications and supplements . Functional ability and status . Nutritional status . Physical activity . Advanced directives . List of other physicians . Hospitalizations, surgeries, and ER visits in previous 12 months . Vitals . Screenings to include cognitive, depression, and falls . Referrals and appointments  In addition, I have reviewed and discussed with patient certain preventive protocols, quality metrics, and best practice recommendations. A written personalized care plan for preventive services as well as general preventive health recommendations were provided to patient.  Due to this being a telephonic visit, the after visit summary with patients personalized plan was offered to patient via office or my-chart. Patient preferred to pick up at office at next visit or via mychart.   Dez[place], Downie, LPN   03/18/7105

## 2020-07-26 ENCOUNTER — Other Ambulatory Visit: Payer: Medicare HMO | Admitting: Neurology

## 2020-07-26 DIAGNOSIS — R410 Disorientation, unspecified: Secondary | ICD-10-CM

## 2020-07-27 NOTE — Progress Notes (Signed)
Kindly inform the patient that MRI scan of the brain shows resolution of the previously seen abnormalities on the MRI from September 2021.  Mild age-related changes of hardening of the arteries.  Nothing to worry about

## 2020-07-30 ENCOUNTER — Other Ambulatory Visit: Payer: Medicare HMO

## 2020-07-31 ENCOUNTER — Ambulatory Visit: Payer: Medicare HMO | Admitting: Neurology

## 2020-08-01 ENCOUNTER — Ambulatory Visit (INDEPENDENT_AMBULATORY_CARE_PROVIDER_SITE_OTHER): Payer: Medicare HMO | Admitting: Family Medicine

## 2020-08-01 ENCOUNTER — Encounter: Payer: Self-pay | Admitting: *Deleted

## 2020-08-01 ENCOUNTER — Other Ambulatory Visit: Payer: Self-pay

## 2020-08-01 ENCOUNTER — Encounter: Payer: Self-pay | Admitting: Family Medicine

## 2020-08-01 VITALS — BP 130/62 | HR 60 | Temp 97.0°F | Ht 69.0 in | Wt 325.5 lb

## 2020-08-01 DIAGNOSIS — Z0000 Encounter for general adult medical examination without abnormal findings: Secondary | ICD-10-CM

## 2020-08-01 NOTE — Progress Notes (Signed)
Annual Exam   Chief Complaint:  Chief Complaint  Patient presents with  . Medicare Wellness    no concerns     History of Present Illness:  Alfred Carney is a 69 y.o. presents today for annual examination.    Weight at home 317   Nutrition/Lifestyle Diet: diabetic diet - ok, lower calorie diet Exercise: home health PT exercises He is not sexually active.  Any issues with getting or keeping erection? Chronic health issues  Social History   Tobacco Use  Smoking Status Former Smoker  . Packs/day: 1.00  . Years: 35.00  . Pack years: 35.00  . Types: Cigarettes  . Quit date: 09/16/2006  . Years since quitting: 13.8  Smokeless Tobacco Never Used   Social History   Substance and Sexual Activity  Alcohol Use No  . Alcohol/week: 0.0 standard drinks   Social History   Substance and Sexual Activity  Drug Use No     Safety The patient wears seatbelts: no - due to pain from his prior surgery.     The patient feels safe at home and in their relationships: yes.  General Health Dentist in the last year: No  - dentures Eye doctor: yes  Weight Wt Readings from Last 3 Encounters:  08/01/20 (!) 325 lb 8 oz (147.6 kg)  07/20/20 (!) 321 lb (145.6 kg)  06/22/20 (!) 321 lb 8 oz (145.8 kg)   Patient has very high BMI  BMI Readings from Last 1 Encounters:  08/01/20 48.07 kg/m     Chronic disease screening Blood pressure monitoring:  BP Readings from Last 3 Encounters:  08/01/20 130/62  07/20/20 (!) 105/57  06/22/20 110/70    Lipid Monitoring: Indication for screening: age >35, obesity, diabetes, family hx, CV risk factors.  Lipid screening: Yes  Lab Results  Component Value Date   CHOL 147 06/10/2020   HDL 36 (L) 06/10/2020   LDLCALC 90 06/10/2020   TRIG 107 06/10/2020   CHOLHDL 4.1 06/10/2020     Diabetes Screening: age >64, overweight, family hx, PCOS, hx of gestational diabetes, at risk ethnicity, elevated blood pressure >135/80.  Diabetes Screening  screening: Yes  Lab Results  Component Value Date   HGBA1C 6.9 (H) 06/10/2020   CBG 125-160  Prostate Cancer Screening: Yes Age 56-69 yo Shared Decision Making Higher Risk: Older age, African American, Family Hx of Prostate Cancer - No Benefits: screening may prevent 1.3 deaths from prostate cancer over 13 years per 1000 men screened and prevent 3 metastatic cases per 1000 men screened. Not enough evidence to support more benefit for AA or [place] Harms: False Positive and psychological harms. 15% of me with false positive over a 2 to 4 year period > resulting in biopsy and complications such as pain, hematospermia, infections. Overdiagnosis - increases with age - found that 20-50% of prostate cancer through screening may have never caused any issues. Harms of treatment include - erectile dysfunction, urinary incontinence, and bothersome bowel symptoms.   After discussion he does not want to get a PSA checked today.   Inadequate evidence for screening <55 No mortality benefit for screening >70   No results found for: PSA1, PSA     Colon Cancer Screening:  Age 67-75 yo - benefits outweigh the risk. Adults 75-85 yo who have never been screened benefit.  Benefits: 134000 people in 2016 will be diagnosed and 49,000 will die - early detection helps Harms: Complications 2/2 to colonoscopy High Risk (Colonoscopy): genetic disorder (Lynch syndrome or familial  adenomatous polyposis), personal hx of IBD, previous adenomatous polyp, or previous colorectal cancer, FamHx start 10 years before the age at diagnosis, increased in males and black race  Options:  FIT - looks for hemoglobin (blood in the stool) - specific and fairly sensitive - must be done annually Cologuard - looks for DNA and blood - more sensitive - therefore can have more false positives, every 3 years Colonoscopy - every 10 years if normal - sedation, bowl prep, must have someone drive you  Shared decision making and the patient  had decided to do colonoscopy - up to date 2019.   Social History   Tobacco Use  Smoking Status Former Smoker  . Packs/day: 1.00  . Years: 35.00  . Pack years: 35.00  . Types: Cigarettes  . Quit date: 09/16/2006  . Years since quitting: 13.8  Smokeless Tobacco Never Used    Lung Cancer Screening (Ages 44-80): yes 20 year pack history? Yes Current Tobacco user? Yes Quit less than 15 years ago? Yes Interested in low dose CT for lung cancer screening? yes  Abdominal Aortic Aneurysm:  Age 29-75, 1 time screening, men who have ever smoked - Aneurysm in 2003 and most recent CT scan with good 2019 with stable CT scan    Past Medical History:  Diagnosis Date  . Chronic obstructive pulmonary disease ([place]) 2008   Moderate  . Colon polyps   . Community acquired pneumonia   . Coronary artery disease   . Degenerative joint disease   . Diabetes mellitus without complication ([place])    type 2  . Emphysema lung ([place])   . Enlarged liver    fatty liver by '09 CT  . Gastroesophageal reflux disease   . Gout   . H/O hiatal hernia   . History of Rocky [place] spotted fever    Possible history of [place] Spotted Fever  . Hyperlipidemia   . Hypertension   . Hypokalemia    diuretic induced, resolved  . Microcytic anemia    iron pills  . Morbid obesity ([place])   . Shortness of breath   . Skin cancer    skin cancer lip removed  . Sleep apnea    not currently using CPAP 05/20/13  . 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 CARDIAC CATHETERIZATION  2007   Ejection fraction is estimated at 60%  . CHOLECYSTECTOMY    . COLONOSCOPY WITH PROPOFOL N/A 01/22/2018   Procedure: COLONOSCOPY WITH PROPOFOL;  Surgeon: Mauri Pole, MD;  Location: [facility];  Service: Endoscopy;  Laterality: N/A;  . DG NERVE ROOT BLOCK LUMBAR-SACRAL EACH ADD. LEVEL  10/23/2018      . ESOPHAGOGASTRODUODENOSCOPY (EGD)  WITH PROPOFOL N/A 01/22/2018   Procedure: ESOPHAGOGASTRODUODENOSCOPY (EGD) WITH PROPOFOL;  Surgeon: Mauri Pole, MD;  Location: [facility];  Service: Endoscopy;  Laterality: N/A;  . HARDWARE REMOVAL Left 05/26/2013   Procedure: HARDWARE REMOVAL;  Surgeon: Newt Minion, MD;  Location: [facility];  Service: Orthopedics;  Laterality: Left;  Left Total Hip Arthroplasty, Removal of Deep Hardware  . HIP SURGERY     Status post left hip surgery with bone grafting  . IR INJECT/THERA/INC NEEDLE/CATH/PLC EPI/LUMB/SAC W/IMG  10/23/2018  . LEFT HEART CATHETERIZATION WITH CORONARY ANGIOGRAM N/A 01/31/2014   Procedure: LEFT HEART CATHETERIZATION WITH CORONARY ANGIOGRAM;  Surgeon: Burnell Blanks, MD;  Location: [facility] CATH LAB;  Service: Cardiovascular;  Laterality: N/A;  .  LUMBAR LAMINECTOMY/DECOMPRESSION MICRODISCECTOMY Left 10/28/2018   Procedure: Left Lumbar three-four Extraforaminal microdiscectomy;  Surgeon: Jovita Gamma, MD;  Location: [facility];  Service: Neurosurgery;  Laterality: Left;  . THORACOABDOMINAL AORTIC ANEURYSM REPAIR     with right femoral and left iliac BPG and reimplantation of renal arteries.  . TONSILLECTOMY    . TONSILLECTOMY    . TOTAL HIP ARTHROPLASTY Left 05/26/2013   Procedure: TOTAL HIP ARTHROPLASTY;  Surgeon: Newt Minion, MD;  Location: [facility];  Service: Orthopedics;  Laterality: Left;  Left Total Hip Arthroplasty, Removal Deep Hardware    Prior to Admission medications   Medication Sig Start Date End Date Taking? Authorizing Provider  acetaminophen (TYLENOL) 325 MG tablet Take 2 tablets (650 mg total) by mouth every 6 (six) hours as needed for mild pain or moderate pain. 06/13/20  Yes Aline August, MD  albuterol (PROVENTIL HFA;VENTOLIN HFA) 108 (90 Base) MCG/ACT inhaler Inhale 2 puffs into the lungs every 6 (six) hours as needed for wheezing or shortness of breath. 01/06/18  Yes Tanda Rockers, MD  Calcium Carbonate-Simethicone (ALKA-SELTZER HEARTBURN + GAS) 750-80 MG  CHEW Chew 1 tablet by mouth as needed (gas).    Yes [provider]  Cholecalciferol (VITAMIN D PO) Take 1 capsule by mouth daily.    Yes [provider]  furosemide (LASIX) 20 MG tablet Take 2 tablets (40 mg total) by mouth daily. 07/17/20  Yes Lesleigh Noe, MD  glipiZIDE (GLUCOTROL) 5 MG tablet Take 1 tablet (5 mg total) by mouth 2 (two) times daily before a meal. 04/29/19  Yes Lesleigh Noe, MD  irbesartan (AVAPRO) 300 MG tablet Take 1 tablet (300 mg total) by mouth daily. 12/06/19  Yes Lesleigh Noe, MD  metoprolol succinate (TOPROL-XL) 25 MG 24 hr tablet Take 1 tablet by mouth once daily Patient taking differently: Take 25 mg by mouth daily.  11/15/19  Yes Lesleigh Noe, MD  Multiple Vitamins-Minerals (MULTIVITAMIN WITH MINERALS) tablet Take 1 tablet by mouth daily.   Yes [provider]  nitroGLYCERIN (NITROSTAT) 0.4 MG SL tablet Place 1 tablet (0.4 mg total) under the tongue every 5 (five) minutes x 3 doses as needed for chest pain. 01/04/19  Yes Lesleigh Noe, MD  OXYGEN Inhale 2 L into the lungs continuous.    Yes [provider]  pravastatin (PRAVACHOL) 20 MG tablet Take 1 tablet (20 mg total) by mouth daily. 10/21/19  Yes Lesleigh Noe, MD  tamsulosin [facility]) 0.4 MG CAPS capsule Take 1 capsule by mouth once daily at bedtime 01/18/20  Yes Lesleigh Noe, MD    No Known Allergies   Social History   Socioeconomic History  . Marital status: Married    Spouse name: Vaughan Basta  . Number of children: 4  . Years of education: GED, ITT  . Highest education level: Not on file  Occupational History  . Occupation: architectural drawing-retired  Tobacco Use  . Smoking status: Former Smoker    Packs/day: 1.00    Years: 35.00    Pack years: 35.00    Types: Cigarettes    Quit date: 09/16/2006    Years since quitting: 13.8  . Smokeless tobacco: Never Used  Vaping Use  . Vaping Use: Never used  Substance and Sexual Activity  . Alcohol use: No     Alcohol/week: 0.0 standard drinks  . Drug use: No  . Sexual activity: Not Currently  Other Topics Concern  . Not on file  Social History Narrative  Lives with wife   Right Handed   Drinks 4-5 cups caffeine daily   Social Determinants of Health   Financial Resource Strain: Low Risk   . Difficulty of Paying Living Expenses: Not hard at all  Food Insecurity: No Food Insecurity  . Worried About Charity fundraiser in the Last Year: Never true  . Ran Out of Food in the Last Year: Never true  Transportation Needs: No Transportation Needs  . Lack of Transportation (Medical): No  . Lack of Transportation (Non-Medical): No  Physical Activity: Sufficiently Active  . Days of Exercise per Week: 7 days  . Minutes of Exercise per Session: 30 min  Stress: No Stress Concern Present  . Feeling of Stress : Not at all  Social Connections:   . Frequency of Communication with Friends and Family: Not on file  . Frequency of Social Gatherings with Friends and Family: Not on file  . Attends Religious Services: Not on file  . Active Member of Clubs or Organizations: Not on file  . Attends Archivist Meetings: Not on file  . Marital Status: Not on file  Intimate Partner Violence: Not At Risk  . Fear of Current or Ex-Partner: No  . Emotionally Abused: No  . Physically Abused: No  . Sexually Abused: No    Family History  Problem Relation Age of Onset  . Osteoarthritis Mother   . Diabetes Mother   . Pulmonary embolism Mother   . Heart disease Father        Coronary Artery Disease  . Stroke Father   . Multiple sclerosis Sister   . Factor V Leiden deficiency Sister   . Emphysema Sister   . Hyperlipidemia Brother   . Lung cancer Maternal Grandfather        smoked  . Heart attack Maternal Grandmother     Review of Systems  Constitutional: Negative for chills and fever.  HENT: Positive for hearing loss. Negative for congestion and sore throat.        Ear wax  Eyes: Negative for  blurred vision and double vision.  Respiratory: Negative for shortness of breath.   Cardiovascular: Negative for chest pain.  Gastrointestinal: Negative for heartburn, nausea and vomiting.  Genitourinary: Negative.   Musculoskeletal: Negative.  Negative for myalgias.  Skin: Negative for rash.  Neurological: Negative for dizziness and headaches.  Endo/Heme/Allergies: Does not bruise/bleed easily.  Psychiatric/Behavioral: Negative for depression. The patient is not nervous/anxious.      Physical Exam BP 130/62   Pulse 60   Temp (!) 97 F (36.1 C) (Temporal)   Ht 5\' 9"  (1.753 m)   Wt (!) 325 lb 8 oz (147.6 kg)   SpO2 95%   BMI 48.07 kg/m    BP Readings from Last 3 Encounters:  08/01/20 130/62  07/20/20 (!) 105/57  06/22/20 110/70      Physical Exam Constitutional:      General: He is not in acute distress.    Appearance: He is well-developed. He is obese. He is not diaphoretic.  HENT:     Head: Normocephalic and atraumatic.     Right Ear: Ear canal normal. There is impacted cerumen.     Left Ear: Ear canal normal. There is impacted cerumen.     Mouth/Throat:     Pharynx: Uvula midline.  Eyes:     General: No scleral icterus.    Conjunctiva/sclera: Conjunctivae normal.     Pupils: Pupils are equal, round, and reactive to light.  Cardiovascular:     Rate and Rhythm: Normal rate and regular rhythm.     Heart sounds: Normal heart sounds.  Pulmonary:     Effort: Pulmonary effort is normal. No respiratory distress.     Breath sounds: Normal breath sounds. No wheezing.     Comments: Nasal canula on O2 Abdominal:     General: Bowel sounds are normal. There is no distension.     Palpations: Abdomen is soft. There is no mass.     Tenderness: There is no abdominal tenderness. There is no guarding.  Musculoskeletal:        General: Normal range of motion.     Cervical back: Normal range of motion and neck supple.  Lymphadenopathy:     Cervical: No cervical adenopathy.   Skin:    General: Skin is warm and dry.     Capillary Refill: Capillary refill takes less than 2 seconds.  Neurological:     Mental Status: He is alert and oriented to person, place, and time.        Results:  PHQ-9:    Clinical Support from 07/25/2020 in [place] at [facility]  PHQ-9 Total Score 0        Assessment: 69 y.o. here for routine annual physical examination.  Plan: Problem List Items Addressed This Visit    None    Visit Diagnoses    Encounter for annual physical exam    -  Primary      Screening: -- Blood pressure screen normal -- cholesterol screening: not due for screening -- Weight screening: obese: discussed management options, including lifestyle, dietary, and exercise. -- Diabetes Screening: pt with diabetes -- Nutrition: normal - Encouraged healthy diet  The 10-year ASCVD risk score Mikey Bussing DC Jr., et al., 2013) is: 36.9%   Values used to calculate the score:     Age: 72 years     Sex: Male     Is Non-Hispanic African American: No     Diabetic: Yes     Tobacco smoker: No     Systolic Blood Pressure: 831 mmHg     Is BP treated: Yes     HDL Cholesterol: 36 mg/dL     Total Cholesterol: 147 mg/dL  -- ASA 81 mg discussed if CVD risk >10% age 42-59 and willing to take for 10 years -- Statin therapy for Age 46-75 with CVD risk >7.5%  Psych -- Depression screening (PHQ-9): negative  Safety -- tobacco screening: not using -- alcohol screening:  low-risk usage. -- no evidence of domestic violence or intimate partner violence.  Cancer Screening -- Prostate (age 57-69) discussed risk/benefit and he declined  -- Colon (age 24-75) up to date -- Lung follows with Dr. Melvyn Novas for known nodule which has been stable, f/u next month   Immunizations Immunization History  Administered Date(s) Administered  . PFIZER SARS-COV-2 Vaccination 11/17/2019, 12/08/2019  . Pneumococcal Conjugate-13 01/24/2015  . Pneumococcal Polysaccharide-23  09/18/2011, 11/12/2016  . Tdap 06/26/2010    -- flu vaccine - declined -- TDAP q10 years - declined -- PPSV-23 (19-64 with chronic disease or smoking) up to date -- PCV-13 (age >22) - one dose followed by PPSV-23 1 year later up to date -- Covid-19 Vaccine up to date  Encouraged regular vision and dental screening. Encouraged healthy exercise and diet.   Explained importance of wearing seatbelt. Encouraged seeing if he could try padding or even just wear the lap belt.   Lesleigh Noe

## 2020-08-01 NOTE — Patient Instructions (Signed)
Try to wear your seatbelt  Continue to work on weight loss

## 2020-08-04 ENCOUNTER — Other Ambulatory Visit: Payer: Self-pay | Admitting: Family Medicine

## 2020-08-04 DIAGNOSIS — E1122 Type 2 diabetes mellitus with diabetic chronic kidney disease: Secondary | ICD-10-CM

## 2020-08-04 DIAGNOSIS — N182 Chronic kidney disease, stage 2 (mild): Secondary | ICD-10-CM

## 2020-08-11 ENCOUNTER — Ambulatory Visit: Payer: Medicare HMO | Admitting: Internal Medicine

## 2020-08-11 ENCOUNTER — Other Ambulatory Visit: Payer: Self-pay

## 2020-08-11 ENCOUNTER — Encounter: Payer: Self-pay | Admitting: Internal Medicine

## 2020-08-11 DIAGNOSIS — J9611 Chronic respiratory failure with hypoxia: Secondary | ICD-10-CM | POA: Diagnosis not present

## 2020-08-11 DIAGNOSIS — J9612 Chronic respiratory failure with hypercapnia: Secondary | ICD-10-CM | POA: Diagnosis not present

## 2020-08-11 DIAGNOSIS — J449 Chronic obstructive pulmonary disease, unspecified: Secondary | ICD-10-CM | POA: Diagnosis not present

## 2020-08-11 NOTE — Assessment & Plan Note (Signed)
Quit smoking 2008  - 2012 PFT's FEV1 of 54% predicted with a ratio of 45%, consistent with moderate to severe COPD with mild inspiratory truncation. - 05/17/2015  Walked RA  2 laps @ 185 ft each stopped due to  Sob/ desat at nl pace to 83%   - trial off acei 05/17/2015  - PFT's  06/28/2015  FEV1 1.67 (51 % ) ratio 55  p 13 % improvement from saba with DLCO  47 % corrects to 66 % for alv volume  - rec trial of symbicort 06/28/15 > coughing / breathing worse so stopped it   - 08/15/2015  Walked RA x 3 laps @ 185 ft each stopped due to End of study, nl pace, min sob/  desat  To 88%  - sats 81% at rest and not acutely ill 11/19/2017 > placed on 2lpm (see separate a/p)  - Spirometry 11/19/2017  FEV1 1.60 (49%)  Ratio 59 with classic curvature - 11/19/2017  After extensive coaching inhaler device  effectiveness =    90% with smi > trial of stiolto   PFT's  01/05/2018  FEV1 1.66 (52 % ) ratio 60  p 6 % improvement from saba p 6 prior to study with DLCO  38 % corrects to 56  % for alv volume   - 01/05/2018  Referred to rehab [facility] - [facility] trial 02/02/2018 > not better so d/c'd   Well compensated s need for saba or having aecopd's on no maint rx   >>> f/u can be yearly, sooner if needed

## 2020-08-11 NOTE — Progress Notes (Signed)
Subjective:   Patient ID: Alfred Carney, male    DOB: August 19, 1951    MRN: [id number]   Brief patient profile:  69  yowm quit smoking 2008 with baseline weight of 240 when he stopped smoking in March of 2008 p pna with doe only some better then  worse since summer 2015 on so referred 05/17/2015 to pulmonary clinic by Dr Helane Rima for copd eval as had been seen here in 2012 with GOLD II criteria.     History of Present Illness 05/17/2015 1st [facility] office visit/ Alfred Carney  / copd eval on ACEi  Chief Complaint  Patient presents with  . Pulmonary Consult    Referre by Dr. Helane Rima. Pt c/o SOB x 10 yrs, gradually worse over the past yr.  He states that he gets SOB with minimal exertion such as taking a shower or walking 50 yrds on flat surface.   indolent onset progressive doe but always ok at rest/  Sleeps in recliner x 10 years Has osa/ could not tol cpap and tends to fall asleep p eats Assoc with sense of nasal and throat congestion but very little cough - does have hb but controls with prn ppi Had been on several different inhalers including according to the records Spiriva but didn't think any of them really helped and stopped them years ago rec Stop lisinopril  Valsartan 80 mg one daily in place of lisinopril Prilosec Take 30- 60 min before your first and last meals of the day just until you return  GERD  Diet     06/28/2015  f/u ov/Alfred Carney re: GOLD II copd/ obesity Chief Complaint  Patient presents with  . Follow-up    PFT done today. Pt states his breathing is unchanged. He c/o cough for the past month- prod with min clear sputum.    Was some better until caught cold x 2 weeks p serving jury duty Has not tried sleeping s recliner  rec Stop hydrodiuril (thiazides contribute to gout)  Start lasix 20 mg daily as needed for swelling  - if you cut the sal out you may not need this  Symbicort 160 Take 2 puffs first thing in am and then another 2 puffs about 12 hours later.        11/19/2017  Extended f/u ov/Alfred Carney re-establish for  worse sob x 3 y  Chief Complaint  Patient presents with  . Follow-up    SOB that has not gotten any better since last OV .  Has a CPAP  but unable to tolerate .   indolent gradually worsening Dyspnea: 50 ft to car down 6 steps from the deck and struggles to back x 3-4 months Cough: none Sleep: poor/ restlessness/ recliner but barely elevated / has cpap but can't tol at all  SABA use:  Not using one now  Not using prilosec regularly  rec stiolto 2 pffs each am  Wear 02 2lpm 24/7 for now Please remember to go to the lab and x-ray department downstairs in the basement  for your tests - we will call you with the results when they are available.   Please schedule a follow up office visit in 6 weeks, call sooner if needed with all medications /inhalers/ solutions in hand so we can verify exactly what you are taking. This includes all medications from all doctors and over the counters  - full pfts on return    01/05/2018  f/u ov/Alfred Carney re:   COPD II 02 2  lpm - no better with stiolto so did not fill  / did not bring meds  Chief Complaint  Patient presents with  . Follow-up    PFT's done today.  Breathing is unchanged since the last visit.    Dyspnea:  MMRC3 = can't walk 100 yards even at a slow pace at a flat grade s stopping due to sob   Cough: none Sleep: recliner < 30 degrees due to back and breathing SABA use:  Not helping  Overt hb on ppi bid ac > for EGD by Dr Nyoka Cowden rec Only use your Albuterol as rescue  Please see patient coordinator before you leave today  to schedule pulmonary rehab at [facility]> done        02/02/2018  f/u ov/Alfred Carney re:  Copd II/ 02 2lpm  Chief Complaint  Patient presents with  . Follow-up    Breathing is about the same. He has good and bad days. He is using his ventolin inhaler 1-2 x per wk on average.    Dyspnea:  MMRC3 = can't walk 100 yards even at a slow pace at a flat grade s stopping due to sob    4 min x  .28 miles /level  Stopped half way due to legs  Cough: none Sleep: recliner due to back > breathing rec Omeprazole 40 mg Take 30-60 min before first meal of the day  Add anoro one click each am - fill it if you feel it improves your activity tolerance especially at rehab       05/06/2018  f/u ov/Alfred Carney re:  Copd II/  2lpm ex up to 4lpm was doing fine with rehab until fell/ now in w/c/ no better on anoro so stopped it  Chief Complaint  Patient presents with  . Follow-up    c/o stable sob with exertion.    Dyspnea:  15 min on treadmill @ 4lpm @ 0.8 up to 1.0 flat then fell 04/27/18  Cough: no Sleeping: recliner 30 degrees/  SABA use: not really using much, no better on anoro so stopped 02: 2lpm   rec The goal is to  Keep the saturations above 90% when you exercise  Have your pcp refer you to orthopedics if needed  Keep the legs elevated at night as much as you can and consider wearing elastic knee highs during the day     08/06/2018  f/u ov/Alfred Carney re:  COPD II/ 02 dep with MO / spn LLL  Chief Complaint  Patient presents with  . Follow-up    headache and head cold for 5 days and "knots in scalp" (saw his PCP).  breathing seems to be better.  O2 at 2L  Dyspnea:  20 min @ l mph flat grade on 3lpm at [employer]  Cough: assoc with nasal congestion/ clear mucus Sleeping: recliner  At 30 degrees  SABA use: none 02:  2lpm / 3lpm with exertion   rec Call your dermatologist today for evaluation of your scalp Please see patient coordinator before you leave today  to schedule PET scan and I'll call the results  Resume PT at [facility]  For cough/ congestion > mucinex dm up 1200 mg every 12 hours as needed    10/21/2018 admit with L Hip pain > 2 discectomy    11/13/2018  f/u ov/Alfred Carney re:   Copd II/ 02 dep/ MO - no resp rx no better on anoro or stiolto Chief Complaint  Patient presents with  . Follow-up  follows for COPD. breathing doing well. just had back surgery.     Dyspnea:  More slowed by back /L hip pain  Cough: none Sleeping: on back flat bed  One pillow SABA use: none  02: 2lpm 24/7  rec No change recommendations except I strongly recommend bed blocks x  6-8 inches   02/02/2019  f/u ov/Alfred Carney re:  COPD II /02 dep/ MO -   Chief Complaint  Patient presents with  . Follow-up    Breathing is unchanged. No new co's.    Dyspnea:  MMRC3 = can't walk 100 yards even at a slow pace at a flat grade s stopping due to sob  Even on 02 with adequate sats Cough: none Sleeping: in recliner 10 degrees  SABA use: none / still does not understand how/ when to use saba  02: 2lpm 24/7 rec Only use your albuterol as a rescue medication  Ok to increase the 02 with exertion with goal of keeping over 90%    08/03/2019  f/u ov/Alfred Carney re: COPD II / 02 dep @ 2lpm  With MO / no maint rx having failed lama/laba trials previously so just uses saba prn  Chief Complaint  Patient presents with  . Follow-up    Breathing is overall doing well and he has not been using his rescue inhaler.   Dyspnea:  MMRC3 = can't walk 100 yards even at a slow pace at a flat grade s stopping due to sob  / does not check while walking  Cough: no cough  Sleeping: in recliner but almost flat  SABA use: none  02: 2lpm 24/7  rec No change rx     02/07/2020  f/u ov/Alfred Carney re: copd II/ 02 dep MO  Chief Complaint  Patient presents with  . Follow-up    pt states sob when doing activities   Dyspnea:  Very much still housebound / no variability  Cough: none  Sleeping: recliner almost flat ok SABA use: none  02: 2lpm 24/7 not titrating  rec Make sure you check your oxygen saturations at highest level of activity to be sure it stays over 90%   Please schedule a follow up visit in  6  months but call sooner if needed    08/11/2020  f/u ov/Alfred Carney re: GOLD II but 02 dep/ MO Chief Complaint  Patient presents with  . Follow-up    COPD, still having SOB, more fatigued   Dyspnea:  About 100 ft to  [place] / steps back up to the house  Just got recumbent bike  Cough: no  Sleeping: recliner SABA use: not at all  02: 2lpm at home  3lpm on pulse 02    No obvious day to day or daytime variability or assoc excess/ purulent sputum or mucus plugs or hemoptysis or cp or chest tightness, subjective wheeze or overt sinus or hb symptoms.   Seeping as above without nocturnal  or early am exacerbation  of respiratory  c/o's or need for noct saba. Also denies any obvious fluctuation of symptoms with weather or environmental changes or other aggravating or alleviating factors except as outlined above   No unusual exposure hx or h/o childhood pna/ asthma or knowledge of premature birth.  Current Allergies, Complete Past Medical History, Past Surgical History, Family History, and Social History were reviewed in [employer] record.  ROS  The following are not active complaints unless bolded Hoarseness, sore throat, dysphagia, dental problems, itching, sneezing,  nasal congestion or  discharge of excess mucus or purulent secretions, ear ache,   fever, chills, sweats, unintended wt loss or wt gain, classically pleuritic or exertional cp,  orthopnea pnd or arm/hand swelling  or leg swelling, presyncope, palpitations, abdominal pain, anorexia, nausea, vomiting, diarrhea  or change in bowel habits or change in bladder habits, change in stools or change in urine, dysuria, hematuria,  rash, arthralgias, visual complaints, headache, numbness, weakness or ataxia or problems with walking or coordination,  change in mood or  memory.        Current Meds  Medication Sig  . acetaminophen (TYLENOL) 325 MG tablet Take 2 tablets (650 mg total) by mouth every 6 (six) hours as needed for mild pain or moderate pain.  Marland Kitchen albuterol (PROVENTIL HFA;VENTOLIN HFA) 108 (90 Base) MCG/ACT inhaler Inhale 2 puffs into the lungs every 6 (six) hours as needed for wheezing or shortness of breath.  . Calcium  Carbonate-Simethicone (ALKA-SELTZER HEARTBURN + GAS) 750-80 MG CHEW Chew 1 tablet by mouth as needed (gas).   . Cholecalciferol (VITAMIN D PO) Take 1 capsule by mouth daily.   . furosemide (LASIX) 20 MG tablet Take 2 tablets (40 mg total) by mouth daily.  Marland Kitchen glipiZIDE (GLUCOTROL) 5 MG tablet TAKE 1 TABLET BY MOUTH TWICE DAILY BEFORE  A  MEAL  . irbesartan (AVAPRO) 300 MG tablet Take 1 tablet (300 mg total) by mouth daily.  . metoprolol succinate (TOPROL-XL) 25 MG 24 hr tablet Take 1 tablet by mouth once daily (Patient taking differently: Take 25 mg by mouth daily. )  . Multiple Vitamins-Minerals (MULTIVITAMIN WITH MINERALS) tablet Take 1 tablet by mouth daily.  . nitroGLYCERIN (NITROSTAT) 0.4 MG SL tablet Place 1 tablet (0.4 mg total) under the tongue every 5 (five) minutes x 3 doses as needed for chest pain.  . OXYGEN Inhale 2 L into the lungs continuous.   . pravastatin (PRAVACHOL) 20 MG tablet Take 1 tablet (20 mg total) by mouth daily.  . tamsulosin (FLOMAX) 0.4 MG CAPS capsule Take 1 capsule by mouth once daily at bedtime                       Objective:   Physical Exam  08/11/2020    322  02/07/2020      329  08/03/2019   329  06/28/2015        326 > 08/15/2015  323 >  11/19/2017  326 >  01/05/2018 320 > 02/02/2018    324 >  05/06/2018  312 >  08/06/2018  317 > 11/13/2018   297  > 02/02/2019   318    05/17/15 329 lb (149.233 kg)  01/19/15 322 lb (146.058 kg)  01/18/15 319 lb (144.697 kg)    Vital signs reviewed  08/11/2020  - Note at rest 02 sats  97% on 3lpm pulsed       amb obese wm nad  Reports  full denture    HEENT : pt wearing mask not removed for exam due to covid -19 concerns.    NECK :  without JVD/Nodes/TM/ nl carotid upstrokes bilaterally   LUNGS: no acc muscle use,  Nl contour chest with distant bs  bilaterally without cough on insp or exp maneuvers   CV:  RRR  no s3 or murmur or increase in P2, and 1+ pitting both LE's  ABD:  Obese soft and nontender with  limited inspiratory excursion  No bruits or organomegaly appreciated, bowel sounds nl  MS:  Nl  gait/ ext warm without deformities, calf tenderness, cyanosis or clubbing No obvious joint restrictions   SKIN: warm and dry with  Flaky venous stasis change both LE's   NEURO:  alert, approp, nl sensorium with  no motor or cerebellar deficits apparent.            Assessment & Plan:

## 2020-08-11 NOTE — Patient Instructions (Signed)
Weight control is simply a matter of calorie balance which needs to be tilted in your favor by eating less and exercising more.  To get the most out of exercise, you need to be continuously aware that you are short of breath, but never out of breath, for ideally 30 minutes up to twice daily. As you improve, it will actually be easier for you to do the same amount of exercise  in  30 minutes so always push to the level where you are short of breath.   Make sure you check your oxygen saturations at highest level of activity to be sure it stays over 90% and adjust  02 flow upward to maintain this level if needed but remember to turn it back to previous settings when you stop (to conserve your supply).    Have your PCP refer you to dermatology and nutrition.   Please schedule a follow up visit in 12  months but call sooner if needed

## 2020-08-11 NOTE — Assessment & Plan Note (Signed)
PFTs  06/28/2015 with restrictive component with erv 41%  Body mass index is 47.58 kg/m.  -  trending down / encouraged  Lab Results  Component Value Date   TSH 3.74 11/19/2017     Contributing to gerd risk/ doe/reviewed the need and the process to achieve and maintain neg calorie balance > defer f/u primary care including intermittently monitoring thyroid status    Strongly rec nutrition eval with food diary (helps previously)          Each maintenance medication was reviewed in detail including emphasizing most importantly the difference between maintenance and prns and under what circumstances the prns are to be triggered using an action plan format where appropriate.  Total time for H and P, chart review, counseling,   directly observing portions of ambulatory 02 saturation study/   and generating customized AVS unique to this office visit / charting  > 30 min

## 2020-08-11 NOTE — Assessment & Plan Note (Signed)
11/19/2017  sats 81% at rest in a chronic stable state so rec 2lpm 24/7  - HC03  11/04/18 = 33  - 08/03/2019 POC trial could not maintain sats at > 88% on up to 5lpm POC  - HC03   06/22/20  = 31  -   08/11/2020   Walked 3lpm pulse   1.5  laps @ approx 257ft each @ moderate pace  stopped due to end of study sob no desats on 02    Advised re sub max ex  (recumbent bike best) x 30 min bu Make sure you check your oxygen saturations at highest level of activity to be sure it stays over 90% and adjust  02 flow upward to maintain this level if needed but remember to turn it back to previous settings when you stop (to conserve your supply).

## 2020-08-14 NOTE — Progress Notes (Signed)
Kindly inform the patient that EEG study was normal

## 2020-08-15 ENCOUNTER — Encounter: Payer: Self-pay | Admitting: *Deleted

## 2020-08-21 DIAGNOSIS — Z961 Presence of intraocular lens: Secondary | ICD-10-CM | POA: Diagnosis not present

## 2020-08-21 DIAGNOSIS — H2512 Age-related nuclear cataract, left eye: Secondary | ICD-10-CM | POA: Diagnosis not present

## 2020-08-21 DIAGNOSIS — H52222 Regular astigmatism, left eye: Secondary | ICD-10-CM | POA: Diagnosis not present

## 2020-08-22 DIAGNOSIS — H2511 Age-related nuclear cataract, right eye: Secondary | ICD-10-CM | POA: Diagnosis not present

## 2020-08-22 DIAGNOSIS — R062 Wheezing: Secondary | ICD-10-CM | POA: Diagnosis not present

## 2020-08-22 DIAGNOSIS — J449 Chronic obstructive pulmonary disease, unspecified: Secondary | ICD-10-CM | POA: Diagnosis not present

## 2020-09-07 ENCOUNTER — Other Ambulatory Visit: Payer: Self-pay | Admitting: Family Medicine

## 2020-09-07 DIAGNOSIS — I10 Essential (primary) hypertension: Secondary | ICD-10-CM

## 2020-09-11 DIAGNOSIS — H524 Presbyopia: Secondary | ICD-10-CM | POA: Diagnosis not present

## 2020-09-11 DIAGNOSIS — H2511 Age-related nuclear cataract, right eye: Secondary | ICD-10-CM | POA: Diagnosis not present

## 2020-09-11 DIAGNOSIS — H2512 Age-related nuclear cataract, left eye: Secondary | ICD-10-CM | POA: Diagnosis not present

## 2020-09-11 DIAGNOSIS — Z961 Presence of intraocular lens: Secondary | ICD-10-CM | POA: Diagnosis not present

## 2020-09-11 DIAGNOSIS — H52222 Regular astigmatism, left eye: Secondary | ICD-10-CM | POA: Diagnosis not present

## 2020-09-22 DIAGNOSIS — R062 Wheezing: Secondary | ICD-10-CM | POA: Diagnosis not present

## 2020-09-22 DIAGNOSIS — J449 Chronic obstructive pulmonary disease, unspecified: Secondary | ICD-10-CM | POA: Diagnosis not present

## 2020-10-03 ENCOUNTER — Other Ambulatory Visit: Payer: Self-pay | Admitting: Family Medicine

## 2020-10-03 DIAGNOSIS — N182 Chronic kidney disease, stage 2 (mild): Secondary | ICD-10-CM

## 2020-10-03 DIAGNOSIS — E1122 Type 2 diabetes mellitus with diabetic chronic kidney disease: Secondary | ICD-10-CM

## 2020-10-03 MED ORDER — GLIPIZIDE 5 MG PO TABS: 5.0000 mg | ORAL_TABLET | Freq: Every day | ORAL | 3 refills | Status: DC

## 2020-10-04 ENCOUNTER — Other Ambulatory Visit: Payer: Self-pay | Admitting: Family Medicine

## 2020-10-04 DIAGNOSIS — L210 Seborrhea capitis: Secondary | ICD-10-CM

## 2020-10-05 ENCOUNTER — Other Ambulatory Visit: Payer: Self-pay | Admitting: Family Medicine

## 2020-10-05 DIAGNOSIS — L210 Seborrhea capitis: Secondary | ICD-10-CM

## 2020-10-05 NOTE — Telephone Encounter (Signed)
Patient is calling in about a "medicated Shampoo" wants to know if this has been sent in and approved. Please advise patient EM

## 2020-10-07 ENCOUNTER — Other Ambulatory Visit: Payer: Self-pay | Admitting: Family Medicine

## 2020-10-07 DIAGNOSIS — E7800 Pure hypercholesterolemia, unspecified: Secondary | ICD-10-CM

## 2020-10-23 DIAGNOSIS — J449 Chronic obstructive pulmonary disease, unspecified: Secondary | ICD-10-CM | POA: Diagnosis not present

## 2020-10-23 DIAGNOSIS — R062 Wheezing: Secondary | ICD-10-CM | POA: Diagnosis not present

## 2020-11-07 ENCOUNTER — Other Ambulatory Visit: Payer: Self-pay | Admitting: *Deleted

## 2020-11-07 MED ORDER — FUROSEMIDE 20 MG PO TABS: 40.0000 mg | ORAL_TABLET | Freq: Every day | ORAL | 0 refills | Status: DC

## 2020-11-07 NOTE — Telephone Encounter (Signed)
Patient called stating that his pharmacy has requested a refill on his furosemide and has not gotten a response. Patient stated that he was told to call the office for the refill since they had not heard anything back. Last office visit 08/01/20 Upcoming appointment 01/30/21 Last refill 07/17/20 #90 [place]

## 2020-11-10 ENCOUNTER — Other Ambulatory Visit: Payer: Self-pay | Admitting: Family Medicine

## 2020-11-15 ENCOUNTER — Other Ambulatory Visit: Payer: Self-pay | Admitting: Family Medicine

## 2020-11-20 DIAGNOSIS — J449 Chronic obstructive pulmonary disease, unspecified: Secondary | ICD-10-CM | POA: Diagnosis not present

## 2020-11-20 DIAGNOSIS — R062 Wheezing: Secondary | ICD-10-CM | POA: Diagnosis not present

## 2020-11-23 ENCOUNTER — Ambulatory Visit: Payer: Medicare HMO | Admitting: Adult Health

## 2020-11-23 ENCOUNTER — Encounter: Payer: Self-pay | Admitting: Adult Health

## 2020-11-23 VITALS — BP 105/58 | HR 64 | Ht 69.0 in | Wt 325.0 lb

## 2020-11-23 DIAGNOSIS — I6783 Posterior reversible encephalopathy syndrome: Secondary | ICD-10-CM | POA: Diagnosis not present

## 2020-11-23 NOTE — Progress Notes (Signed)
[facility] [address] [place]. [place] [address] [phone]       OFFICE FOLLOW UP NOTE  [name] Date of Birth:  12/06/1950 Medical Record Number:  [id number]   Referring MD:  Alfred Carney  Reason for Referral: Posterior reversible encephalopathy syndrome   Chief Complaint  Patient presents with  . Follow-up    RM 54 with wife (linda) Pt hasn't had any more episodes, per wife       HPI:   Today, 11/23/2020, [name] returns for scheduled follow-up visit after initial consult visit approximately 4 months ago.  He is accompanied by his wife.  Stable since prior visit without new or reoccurring neurological symptoms Repeat MRI showed resolution of prior abnormalities Repeat EEG normal  Blood pressure today initially 151/75 (automatic) and recheck 105/58 (manual)  Routinely monitors at home and typically 145-150/70-75  No concerns at this time     History provided for reference purposes only Consult visit 07/20/2020 Dr. Leonie Carney: Alfred Carney is a 70 year old Caucasian male seen today for initial office consultation visit.  History is obtained from the patient and his wife is accompanying her as well as review of electronic medical records.  I personally reviewed available pertinent imaging films in PACS.Alfred Carney is an 70 y.o. male with a PMHx of COPD, CAD, DM2, GERD, fatty liver, HLD, HTN, morbid obesity, s/p repair of thoracoabdominal aortic aneurysm and sleep apnea who presents to the ED from home via EMS as a Code Stroke. He was having a headache early this evening prior to taking a nap, but was otherwise at his baseline. On awakening at 11:30 PM, he complained to his wife that he could not see. His speech then rapidly became less intelligible. EMS was called and on arrival, the patient could only say "what" in response to questions and was not following commands. He did not seem to be agitated or in pain. En route his BP was 220/80 with CBG  of 165. On arrival to the ED his BP had further increased to 240/114. He was completely noverbal on arrival to the ED but with eyes open. STAT CT head was obtained, which revealed no acute abnormality.  MRI scan however was highly suggestive of posterior reversible encephalopathy syndrome with T2 and FLAIR hyperintensities in the parieto-occipital cortex.  MRA of the brain was unremarkable.  Patient mental status improved as the blood pressure was gradually controlled but he had palinopsia and some vision dysfunction and speech also improved.  He was placed on long-term EEG monitoring which showed mild diffuse encephalopathy without definite epileptiform activity hence he was not started on anticonvulsants.  Echocardiogram was unremarkable.  Carotid ultrasound showed no significant extracranial stenosis.  LDL cholesterol was 90 mg percent.  Patient states is done well since discharge.  Is been on antihypertensives blood pressure is well controlled in fact today it is low it is 105/57 today.  He is able to walk indoors without his assistance but uses a wheelchair for long distance as he gets short of breath because of his obesity.  He has been on home oxygen for his COPD for the last couple of years now.  He feels his mental status improved back to baseline and his visual dysfunction is also resolved.  He has no new complaints today.  He is scheduled to undergo cataract surgery in the first week of December.  He does have obstructive sleep apnea but the patient refuses to wear his CPAP as he feels he  cannot tolerate it     ROS:   14 system review of systems is positive for those listed in HPI and all other systems negative  PMH:  Past Medical History:  Diagnosis Date  . Chronic obstructive pulmonary disease ([place]) 2008   Moderate  . Colon polyps   . Community acquired pneumonia   . Coronary artery disease   . Degenerative joint disease   . Diabetes mellitus without complication ([place])    type 2  .  Emphysema lung ([place])   . Enlarged liver    fatty liver by '09 CT  . Gastroesophageal reflux disease   . Gout   . H/O hiatal hernia   . History of [place] spotted fever    Possible history of [place] Spotted Fever  . Hyperlipidemia   . Hypertension   . Hypokalemia    diuretic induced, resolved  . Microcytic anemia    iron pills  . Morbid obesity ([place])   . Shortness of breath   . Skin cancer    skin cancer lip removed  . Sleep apnea    not currently using CPAP 05/20/13  . 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    Social History:  Social History   Socioeconomic History  . Marital status: Married    Spouse name: Vaughan Basta  . Number of children: 4  . Years of education: GED, ITT  . Highest education level: Not on file  Occupational History  . Occupation: architectural drawing-retired  Tobacco Use  . Smoking status: Former Smoker    Packs/day: 1.00    Years: 35.00    Pack years: 35.00    Types: Cigarettes    Quit date: 09/16/2006    Years since quitting: 14.1  . Smokeless tobacco: Never Used  Vaping Use  . Vaping Use: Never used  Substance and Sexual Activity  . Alcohol use: No    Alcohol/week: 0.0 standard drinks  . Drug use: No  . Sexual activity: Not Currently  Other Topics Concern  . Not on file  Social History Narrative   Lives with wife   Right Handed   Drinks 4-5 cups caffeine daily   Social Determinants of Health   Financial Resource Strain: Low Risk   . Difficulty of Paying Living Expenses: Not hard at all  Food Insecurity: No Food Insecurity  . Worried About Charity fundraiser in the Last Year: Never true  . Ran Out of Food in the Last Year: Never true  Transportation Needs: No Transportation Needs  . Lack of Transportation (Medical): No  . Lack of Transportation (Non-Medical): No  Physical Activity: Sufficiently Active  . Days of Exercise per Week: 7 days  . Minutes of Exercise per Session: 30 min   Stress: No Stress Concern Present  . Feeling of Stress : Not at all  Social Connections: Not on file  Intimate Partner Violence: Not At Risk  . Fear of Current or Ex-Partner: No  . Emotionally Abused: No  . Physically Abused: No  . Sexually Abused: No    Medications:   Current Outpatient Medications on File Prior to Visit  Medication Sig Dispense Refill  . acetaminophen (TYLENOL) 325 MG tablet Take 2 tablets (650 mg total) by mouth every 6 (six) hours as needed for mild pain or moderate pain.    Marland Kitchen albuterol (PROVENTIL HFA;VENTOLIN HFA) 108 (90 Base) MCG/ACT inhaler Inhale 2 puffs into the lungs every 6 (six) hours as needed for  wheezing or shortness of breath. 1 Inhaler 6  . Calcium Carbonate-Simethicone 750-80 MG CHEW Chew 1 tablet by mouth as needed (gas).     . Cholecalciferol (VITAMIN D PO) Take 1 capsule by mouth daily.     . furosemide (LASIX) 20 MG tablet Take 2 tablets (40 mg total) by mouth daily. 180 tablet 0  . glipiZIDE (GLUCOTROL) 5 MG tablet Take 1 tablet (5 mg total) by mouth daily before breakfast. 90 tablet 3  . irbesartan (AVAPRO) 300 MG tablet Take 1 tablet by mouth once daily 90 tablet 1  . ketoconazole (NIZORAL) 2 % shampoo APPLY ONE APPLICATION TOPICALLY AS NEEDED FOR IRRITATION 120 mL 0  . metoprolol succinate (TOPROL-XL) 25 MG 24 hr tablet Take 1 tablet by mouth once daily 90 tablet 0  . Multiple Vitamins-Minerals (MULTIVITAMIN WITH MINERALS) tablet Take 1 tablet by mouth daily.    . nitroGLYCERIN (NITROSTAT) 0.4 MG SL tablet Place 1 tablet (0.4 mg total) under the tongue every 5 (five) minutes x 3 doses as needed for chest pain. 25 tablet 0  . OXYGEN Inhale 2 L into the lungs continuous.     . pravastatin (PRAVACHOL) 20 MG tablet Take 1 tablet by mouth once daily 90 tablet 0  . tamsulosin (FLOMAX) 0.4 MG CAPS capsule Take 1 capsule by mouth once daily at bedtime 90 capsule 3   No current facility-administered medications on file prior to visit.    Allergies:   No Known Allergies  Physical Exam Today's Vitals   11/23/20 1530 11/23/20 1549  BP: (!) 151/75 (!) 105/58  Pulse: 64   Weight: (!) 325 lb (147.4 kg)   Height: 5\' 9"  (1.753 m)    Body mass index is 47.99 kg/m.   General: Morbidly obese pleasant elderly Caucasian male. 02 via [place], seated, in no evident distress Head: head normocephalic and atraumatic.   Neck: supple with no carotid or supraclavicular bruits Cardiovascular: regular rate and rhythm, no murmurs Musculoskeletal: no deformity Skin:  no rash/petichiae Vascular:  Normal pulses all extremities  Neurologic Exam Mental Status: Awake and fully alert.  Fluent speech and language.  Oriented to place and time. Recent and remote memory intact. Attention span, concentration and fund of knowledge appropriate. Mood and affect appropriate.  Cranial Nerves: Pupils equal, briskly reactive to light. Extraocular movements full without nystagmus. Visual fields full to confrontation. Hearing intact. Facial sensation intact. Face, tongue, palate moves normally and symmetrically.  Motor: Normal bulk and tone. Normal strength in all tested extremity muscles. Sensory.: intact to touch , pinprick , position and vibratory sensation.  Coordination: Rapid alternating movements normal in all extremities. Finger-to-nose and heel-to-shin performed accurately bilaterally. Gait and Station: Stands from seated position without difficulty.  Gait demonstrates normal stride length and balance although wide-based gait without use of assistive device Reflexes: 1+ and symmetric. Toes downgoing.      ASSESSMENT/PLAN: 70 year old Caucasian male with transient episode of confusion, memory loss and vision disturbance due to posterior reversible encephalopathy syndrome in September 2021.    PRES -No residual side effects or reoccurring symptoms -Discussed importance of strict blood pressure control with goal <130/90 -Blood pressure initially elevated but on  recheck 105/58 manually -encouraged him to bring his home BP cuff to follow-up visit with PCP to compare his cuff with manual reading to ensure accuracy as his levels at home were more comparable to initial elevated reading - if BP levels generally within 150s range, would recommend tighter BP control -MRI w/wo contrast 07/2020 resolution  of prior abnormalities -EEG 07/2020 normal   Follow-up in 6 months or call earlier if needed    CC:  [facility] provider: Dr. Vikki Ports, Jobe Marker, MD   I spent 25 minutes of face-to-face and non-face-to-face time with patient and wife.  This included previsit chart review, lab review, study review, order entry, electronic health record documentation, patient education and discussion regarding PRES, prevention and routine follow-up with PCP, review of MRI and EEG and answered all other questions to patient and wife satisfaction  Frann Rider, AGNP-BC  [facility] [address] [place], [place] [address]  Phone [phone] Fax [phone] Note: This document was prepared with digital dictation and possible smart phrase technology. Any transcriptional errors that result from this process are unintentional.

## 2020-12-03 NOTE — Progress Notes (Signed)
I agree with the above plan 

## 2020-12-09 ENCOUNTER — Other Ambulatory Visit: Payer: Self-pay | Admitting: Family Medicine

## 2020-12-09 DIAGNOSIS — E1122 Type 2 diabetes mellitus with diabetic chronic kidney disease: Secondary | ICD-10-CM

## 2020-12-11 ENCOUNTER — Other Ambulatory Visit: Payer: Self-pay | Admitting: Family Medicine

## 2020-12-11 DIAGNOSIS — E1122 Type 2 diabetes mellitus with diabetic chronic kidney disease: Secondary | ICD-10-CM

## 2020-12-21 DIAGNOSIS — J449 Chronic obstructive pulmonary disease, unspecified: Secondary | ICD-10-CM | POA: Diagnosis not present

## 2020-12-21 DIAGNOSIS — R062 Wheezing: Secondary | ICD-10-CM | POA: Diagnosis not present

## 2020-12-29 ENCOUNTER — Other Ambulatory Visit: Payer: Self-pay | Admitting: Family Medicine

## 2020-12-29 DIAGNOSIS — N400 Enlarged prostate without lower urinary tract symptoms: Secondary | ICD-10-CM

## 2021-01-08 ENCOUNTER — Other Ambulatory Visit: Payer: Self-pay | Admitting: Family Medicine

## 2021-01-08 DIAGNOSIS — E7800 Pure hypercholesterolemia, unspecified: Secondary | ICD-10-CM

## 2021-01-08 NOTE — Telephone Encounter (Signed)
Pt needs a DM follow-up appt, please.  Refilling for 3 months.

## 2021-01-20 DIAGNOSIS — J449 Chronic obstructive pulmonary disease, unspecified: Secondary | ICD-10-CM | POA: Diagnosis not present

## 2021-01-20 DIAGNOSIS — R062 Wheezing: Secondary | ICD-10-CM | POA: Diagnosis not present

## 2021-01-30 ENCOUNTER — Ambulatory Visit (INDEPENDENT_AMBULATORY_CARE_PROVIDER_SITE_OTHER): Payer: Medicare HMO | Admitting: Family Medicine

## 2021-01-30 ENCOUNTER — Other Ambulatory Visit: Payer: Self-pay

## 2021-01-30 VITALS — BP 130/60 | HR 78 | Temp 97.0°F | Ht 69.0 in | Wt 332.0 lb

## 2021-01-30 DIAGNOSIS — J9611 Chronic respiratory failure with hypoxia: Secondary | ICD-10-CM | POA: Diagnosis not present

## 2021-01-30 DIAGNOSIS — N182 Chronic kidney disease, stage 2 (mild): Secondary | ICD-10-CM | POA: Diagnosis not present

## 2021-01-30 DIAGNOSIS — J9612 Chronic respiratory failure with hypercapnia: Secondary | ICD-10-CM | POA: Diagnosis not present

## 2021-01-30 DIAGNOSIS — I10 Essential (primary) hypertension: Secondary | ICD-10-CM

## 2021-01-30 DIAGNOSIS — J449 Chronic obstructive pulmonary disease, unspecified: Secondary | ICD-10-CM

## 2021-01-30 DIAGNOSIS — E1122 Type 2 diabetes mellitus with diabetic chronic kidney disease: Secondary | ICD-10-CM

## 2021-01-30 DIAGNOSIS — I872 Venous insufficiency (chronic) (peripheral): Secondary | ICD-10-CM | POA: Diagnosis not present

## 2021-01-30 LAB — POCT GLYCOSYLATED HEMOGLOBIN (HGB A1C): Hemoglobin A1C: 6.4 % — AB (ref 4.0–5.6)

## 2021-01-30 LAB — BASIC METABOLIC PANEL
BUN: 18 mg/dL (ref 6–23)
CO2: 28 mEq/L (ref 19–32)
Calcium: 9.2 mg/dL (ref 8.4–10.5)
Chloride: 107 mEq/L (ref 96–112)
Creatinine, Ser: 1.35 mg/dL (ref 0.40–1.50)
GFR: 52.97 mL/min — ABNORMAL LOW (ref >60.00)
Glucose, Bld: 130 mg/dL — ABNORMAL HIGH (ref 70–99)
Potassium: 4 mEq/L (ref 3.5–5.1)
Sodium: 144 mEq/L (ref 135–145)

## 2021-01-30 NOTE — Assessment & Plan Note (Signed)
Stable. Cont 2 L [place]. Follows with Dr. Melvyn Novas - appreciate support. Cont prn albuterol. Encouraged weight loss.

## 2021-01-30 NOTE — Assessment & Plan Note (Signed)
Rash seems most consistent with venous stasis. Not bothersome so will observe for now.

## 2021-01-30 NOTE — Assessment & Plan Note (Signed)
BP at goal. Cont metoprolol 25 mg, irbesartan 300 mg.

## 2021-01-30 NOTE — Progress Notes (Signed)
Subjective:     Alfred Carney is a 70 y.o. male presenting for Follow-up (6 months- DM )     HPI  #Diabetes Currently taking glipizide  Using medications without difficulties: No Hypoglycemic episodes:does not check Hyperglycemic episodes:does not check Feet problems:No  Blood Sugars averaging: does not check Last HgbA1c:  Lab Results  Component Value Date   HGBA1C 6.4 (A) 01/30/2021    Diabetes Health Maintenance Due:    Diabetes Health Maintenance Due  Topic Date Due  . FOOT EXAM  10/20/2020  . OPHTHALMOLOGY EXAM  04/25/2021  . HEMOGLOBIN A1C  08/02/2021    #htn - taking medication - no cp, ha  #COPD - baseline SOB - 2 L [place] - worse with moving around - walking across the room is tiring   #weight gain - gained a few lbs - has been snacking at night - does have a little more swelling - not exercising -   Review of Systems   Social History   Tobacco Use  Smoking Status Former Smoker  . Packs/day: 1.00  . Years: 35.00  . Pack years: 35.00  . Types: Cigarettes  . Quit date: 09/16/2006  . Years since quitting: 14.3  Smokeless Tobacco Never Used        Objective:    BP Readings from Last 3 Encounters:  01/30/21 130/60  11/23/20 (!) 105/58  08/11/20 134/82   Wt Readings from Last 3 Encounters:  01/30/21 (!) 332 lb (150.6 kg)  11/23/20 (!) 325 lb (147.4 kg)  08/11/20 (!) 322 lb 3.2 oz (146.1 kg)    BP 130/60   Pulse 78   Temp (!) 97 F (36.1 C) (Temporal)   Ht 5\' 9"  (1.753 m)   Wt (!) 332 lb (150.6 kg)   SpO2 (!) 89%   BMI 49.03 kg/m    Physical Exam Constitutional:      Appearance: Normal appearance. He is not ill-appearing or diaphoretic.  HENT:     Right Ear: External ear normal.     Left Ear: External ear normal.  Eyes:     General: No scleral icterus.    Extraocular Movements: Extraocular movements intact.     Conjunctiva/sclera: Conjunctivae normal.  Cardiovascular:     Rate and Rhythm: Normal rate and regular  rhythm.     Heart sounds: Heart sounds are distant.  Pulmonary:     Effort: Pulmonary effort is normal. No tachypnea or accessory muscle usage.     Breath sounds: Normal breath sounds.  Musculoskeletal:     Cervical back: Neck supple.     Comments: Trace b/l edema  Skin:    General: Skin is warm and dry.     Comments: Bilateral erythema on the shins. TTP.   Neurological:     Mental Status: He is alert. Mental status is at baseline.  Psychiatric:        Mood and Affect: Mood normal.           Assessment & Plan:   Problem List Items Addressed This Visit      Cardiovascular and Mediastinum   Essential hypertension    BP at goal. Cont metoprolol 25 mg, irbesartan 300 mg.       Relevant Orders   Basic metabolic panel     Respiratory   COPD GOLD II    Stable. Cont 2 L [place]. Follows with Dr. Melvyn Novas - appreciate support. Cont prn albuterol. Encouraged weight loss.       Chronic respiratory failure  with hypoxia and hypercapnia (HCC)    Stable but persistent symptoms. Appreciate Dr. Melvyn Novas support. Cont 2 L [place]        Endocrine   Type 2 diabetes mellitus with stage 2 chronic kidney disease, without long-term current use of insulin ([place]) - Primary    Continues to have improved control. Discussed stopping glipizide trial and diet alone. Return 3 months.   Lab Results  Component Value Date   HGBA1C 6.4 (A) 01/30/2021         Relevant Orders   POCT glycosylated hemoglobin (Hb A1C) (Completed)   Basic metabolic panel     Musculoskeletal and Integument   Venous stasis dermatitis of both lower extremities    Rash seems most consistent with venous stasis. Not bothersome so will observe for now.         Other   Morbid obesity due to excess calories ([place])    Encouraged considering nutrition referral as weight loss would help lung functioning. He declined. Encouraged reducing calories and working on exercise. He will consider.           Return in about 3 months (around  05/02/2021) for diabetes.  Lesleigh Noe, MD  This visit occurred during the SARS-CoV-2 public health emergency.  Safety protocols were in place, including screening questions prior to the visit, additional usage of staff PPE, and extensive cleaning of exam room while observing appropriate contact time as indicated for disinfecting solutions.

## 2021-01-30 NOTE — Assessment & Plan Note (Signed)
Stable but persistent symptoms. Appreciate Dr. Melvyn Novas support. Cont 2 L [place]

## 2021-01-30 NOTE — Assessment & Plan Note (Signed)
Continues to have improved control. Discussed stopping glipizide trial and diet alone. Return 3 months.   Lab Results  Component Value Date   HGBA1C 6.4 (A) 01/30/2021

## 2021-01-30 NOTE — Patient Instructions (Signed)
#  Diabetes - keep doing low sugar and low carb diet - stop Glipizide - return in 3 months - call or mychart sooner if you notice higher sugars at home  #Weight - work on healthy diet for goal of weight loss

## 2021-01-30 NOTE — Assessment & Plan Note (Signed)
Encouraged considering nutrition referral as weight loss would help lung functioning. He declined. Encouraged reducing calories and working on exercise. He will consider.

## 2021-02-12 ENCOUNTER — Other Ambulatory Visit: Payer: Self-pay | Admitting: Family Medicine

## 2021-02-20 DIAGNOSIS — J449 Chronic obstructive pulmonary disease, unspecified: Secondary | ICD-10-CM | POA: Diagnosis not present

## 2021-02-20 DIAGNOSIS — R062 Wheezing: Secondary | ICD-10-CM | POA: Diagnosis not present

## 2021-02-24 ENCOUNTER — Other Ambulatory Visit: Payer: Self-pay | Admitting: Family Medicine

## 2021-02-24 DIAGNOSIS — I10 Essential (primary) hypertension: Secondary | ICD-10-CM

## 2021-03-14 ENCOUNTER — Other Ambulatory Visit: Payer: Self-pay | Admitting: Internal Medicine

## 2021-03-14 DIAGNOSIS — R911 Solitary pulmonary nodule: Secondary | ICD-10-CM

## 2021-03-21 ENCOUNTER — Other Ambulatory Visit: Payer: Self-pay | Admitting: Family Medicine

## 2021-03-21 DIAGNOSIS — L210 Seborrhea capitis: Secondary | ICD-10-CM

## 2021-03-22 DIAGNOSIS — R062 Wheezing: Secondary | ICD-10-CM | POA: Diagnosis not present

## 2021-03-22 DIAGNOSIS — J449 Chronic obstructive pulmonary disease, unspecified: Secondary | ICD-10-CM | POA: Diagnosis not present

## 2021-03-26 ENCOUNTER — Ambulatory Visit (INDEPENDENT_AMBULATORY_CARE_PROVIDER_SITE_OTHER)
Admission: RE | Admit: 2021-03-26 | Discharge: 2021-03-26 | Disposition: A | Discharge status: Home / Self Care | Payer: Medicare HMO | Source: Ambulatory Visit | Attending: Internal Medicine | Admitting: Internal Medicine

## 2021-03-26 ENCOUNTER — Other Ambulatory Visit: Payer: Self-pay

## 2021-03-26 DIAGNOSIS — R911 Solitary pulmonary nodule: Secondary | ICD-10-CM

## 2021-03-26 DIAGNOSIS — I700 Atherosclerosis of aorta: Secondary | ICD-10-CM | POA: Diagnosis not present

## 2021-03-26 DIAGNOSIS — J439 Emphysema, unspecified: Secondary | ICD-10-CM | POA: Diagnosis not present

## 2021-04-03 ENCOUNTER — Other Ambulatory Visit: Payer: Self-pay | Admitting: Family Medicine

## 2021-04-03 DIAGNOSIS — I110 Hypertensive heart disease with heart failure: Secondary | ICD-10-CM | POA: Diagnosis not present

## 2021-04-03 DIAGNOSIS — I509 Heart failure, unspecified: Secondary | ICD-10-CM | POA: Diagnosis not present

## 2021-04-03 DIAGNOSIS — J9611 Chronic respiratory failure with hypoxia: Secondary | ICD-10-CM | POA: Diagnosis not present

## 2021-04-03 DIAGNOSIS — Z6841 Body Mass Index (BMI) 40.0 and over, adult: Secondary | ICD-10-CM | POA: Diagnosis not present

## 2021-04-03 DIAGNOSIS — E785 Hyperlipidemia, unspecified: Secondary | ICD-10-CM | POA: Diagnosis not present

## 2021-04-03 DIAGNOSIS — J439 Emphysema, unspecified: Secondary | ICD-10-CM | POA: Diagnosis not present

## 2021-04-03 DIAGNOSIS — E11620 Type 2 diabetes mellitus with diabetic dermatitis: Secondary | ICD-10-CM | POA: Diagnosis not present

## 2021-04-03 DIAGNOSIS — E7800 Pure hypercholesterolemia, unspecified: Secondary | ICD-10-CM

## 2021-04-03 DIAGNOSIS — I25119 Atherosclerotic heart disease of native coronary artery with unspecified angina pectoris: Secondary | ICD-10-CM | POA: Diagnosis not present

## 2021-04-03 DIAGNOSIS — E1151 Type 2 diabetes mellitus with diabetic peripheral angiopathy without gangrene: Secondary | ICD-10-CM | POA: Diagnosis not present

## 2021-04-22 DIAGNOSIS — J449 Chronic obstructive pulmonary disease, unspecified: Secondary | ICD-10-CM | POA: Diagnosis not present

## 2021-04-22 DIAGNOSIS — R062 Wheezing: Secondary | ICD-10-CM | POA: Diagnosis not present

## 2021-05-02 ENCOUNTER — Telehealth: Payer: Self-pay | Admitting: Family Medicine

## 2021-05-02 NOTE — Progress Notes (Signed)
  Chronic Care Management   Note  05/02/2021 Name: Gianni Hout MRN: [id number] DOB: 01-25-1951  Joakim Morelock is a 70 y.o. year old male who is a primary care patient of Einar Pheasant, Jobe Marker, MD. I reached out to Gerlene Fee by phone today in response to a referral sent by Mr. Tabias Thorns's PCP, Lesleigh Noe, MD.   Mr. Vogele was given information about Chronic Care Management services today including:  CCM service includes personalized support from designated clinical staff supervised by his physician, including individualized plan of care and coordination with other care providers 24/7 contact phone numbers for assistance for urgent and routine care needs. Service will only be billed when office clinical staff spend 20 minutes or more in a month to coordinate care. Only one practitioner may furnish and bill the service in a calendar month. The patient may stop CCM services at any time (effective at the end of the month) by phone call to the office staff.   Patient agreed to services and verbal consent obtained.   Follow up plan:   Tatjana Secretary/administrator

## 2021-05-02 NOTE — Chronic Care Management (AMB) (Signed)
  Chronic Care Management   Outreach Note  05/02/2021 Name: Alfred Carney MRN: [id number] DOB: Jun 30, 1951  Referred by: Lesleigh Noe, MD Reason for referral : No chief complaint on file.   An unsuccessful telephone outreach was attempted today. The patient was referred to the pharmacist for assistance with care management and care coordination.   Follow Up Plan:   Tatjana Dellinger Upstream Scheduler

## 2021-05-03 ENCOUNTER — Other Ambulatory Visit: Payer: Self-pay

## 2021-05-03 ENCOUNTER — Ambulatory Visit (INDEPENDENT_AMBULATORY_CARE_PROVIDER_SITE_OTHER): Payer: Medicare HMO | Admitting: Family Medicine

## 2021-05-03 VITALS — BP 132/80 | HR 61 | Temp 98.0°F | Ht 69.0 in | Wt 328.8 lb

## 2021-05-03 DIAGNOSIS — E1122 Type 2 diabetes mellitus with diabetic chronic kidney disease: Secondary | ICD-10-CM | POA: Diagnosis not present

## 2021-05-03 DIAGNOSIS — N182 Chronic kidney disease, stage 2 (mild): Secondary | ICD-10-CM

## 2021-05-03 DIAGNOSIS — H6123 Impacted cerumen, bilateral: Secondary | ICD-10-CM | POA: Diagnosis not present

## 2021-05-03 LAB — POCT GLYCOSYLATED HEMOGLOBIN (HGB A1C): Hemoglobin A1C: 8.5 % — AB (ref 4.0–5.6)

## 2021-05-03 MED ORDER — GLIPIZIDE 5 MG PO TABS: 5.0000 mg | ORAL_TABLET | Freq: Two times a day (BID) | ORAL | 1 refills | Status: DC

## 2021-05-03 NOTE — Patient Instructions (Signed)
#  Diabetes - check fasting 3 times a week - return in 2 months for appointment - restart glipizide

## 2021-05-03 NOTE — Assessment & Plan Note (Signed)
Lab Results  Component Value Date   HGBA1C 8.5 (A) 05/03/2021   Worse in setting of stopping medication. Restart glipizide 5 mg BID. Home glucose monitoring and return in 2 months to reassess and consider additional medicaiton.

## 2021-05-03 NOTE — Progress Notes (Signed)
Subjective:     Alfred Carney is a 70 y.o. male presenting for Follow-up (3 mo- DM /Has eye exam scheduled next month) and Hearing Problem (Wife states he has gone "completely deaf" over last few months )     HPI  #Diabetes Currently taking no medications  Using medications without difficulties: No Feet problems:No  Blood Sugars averaging: 150s Last HgbA1c:  Lab Results  Component Value Date   HGBA1C 8.5 (A) 05/03/2021    Diabetes Health Maintenance Due:    Diabetes Health Maintenance Due  Topic Date Due   FOOT EXAM  10/20/2020   OPHTHALMOLOGY EXAM  04/25/2021   HEMOGLOBIN A1C  11/03/2021    #Hearing loss - recent over   Review of Systems   Social History   Tobacco Use  Smoking Status Former   Packs/day: 1.00   Years: 35.00   Pack years: 35.00   Types: Cigarettes   Quit date: 09/16/2006   Years since quitting: 14.6  Smokeless Tobacco Never        Objective:    BP Readings from Last 3 Encounters:  05/03/21 132/80  01/30/21 130/60  11/23/20 (!) 105/58   Wt Readings from Last 3 Encounters:  05/03/21 (!) 328 lb 12 oz (149.1 kg)  01/30/21 (!) 332 lb (150.6 kg)  11/23/20 (!) 325 lb (147.4 kg)    BP 132/80   Pulse 61   Temp 98 F (36.7 C) (Temporal)   Ht '5\' 9"'$  (1.753 m)   Wt (!) 328 lb 12 oz (149.1 kg)   SpO2 92%   BMI 48.55 kg/m    Physical Exam Constitutional:      Appearance: Normal appearance. He is not ill-appearing or diaphoretic.  HENT:     Right Ear: External ear normal. There is impacted cerumen.     Left Ear: External ear normal. There is impacted cerumen.     Nose: Nose normal.  Eyes:     General: No scleral icterus.    Extraocular Movements: Extraocular movements intact.     Conjunctiva/sclera: Conjunctivae normal.  Cardiovascular:     Rate and Rhythm: Normal rate and regular rhythm.  Pulmonary:     Effort: Pulmonary effort is normal. No respiratory distress.     Breath sounds: Normal breath sounds.     Comments:  Breathing comfortably on oxygen Musculoskeletal:     Cervical back: Neck supple.  Skin:    General: Skin is warm and dry.  Neurological:     Mental Status: He is alert. Mental status is at baseline.  Psychiatric:        Mood and Affect: Mood normal.          Assessment & Plan:   Problem List Items Addressed This Visit       Endocrine   Type 2 diabetes mellitus with stage 2 chronic kidney disease, without long-term current use of insulin ([place]) - Primary    Lab Results  Component Value Date   HGBA1C 8.5 (A) 05/03/2021  Worse in setting of stopping medication. Restart glipizide 5 mg BID. Home glucose monitoring and return in 2 months to reassess and consider additional medicaiton.       Relevant Medications   glipiZIDE (GLUCOTROL) 5 MG tablet   Other Relevant Orders   POCT glycosylated hemoglobin (Hb A1C) (Completed)   Other Visit Diagnoses     Bilateral hearing loss due to cerumen impaction          Cerumen removed with return of hearing. Discussed not  using q-tips or bobby pins in ears and using OTC ear wax softener drops prn   Return in about 2 months (around 07/03/2021).  Lesleigh Noe, MD  This visit occurred during the SARS-CoV-2 public health emergency.  Safety protocols were in place, including screening questions prior to the visit, additional usage of staff PPE, and extensive cleaning of exam room while observing appropriate contact time as indicated for disinfecting solutions.

## 2021-05-08 ENCOUNTER — Telehealth: Payer: Self-pay

## 2021-05-08 NOTE — Chronic Care Management (AMB) (Addendum)
Chronic Care Management Pharmacy Assistant   Name: Alfred Carney  MRN: [id number] DOB: 04-23-1951   Reason for Encounter: Initial Questions  Conditions to be addressed/monitored:  HTN, DM, CAD, CKD stage 2, COPD, HLD  Recent office visits:  05/03/21- PCP - Patient presented for follow up diabetes and hearing issues. Restart glipizide 5 mg BID. Follow up 2 months. 01/30/21- PCP - Patient presented for follow up diabetes. Stopped Glipizide '5mg'$  and try diet alone. Follow up 3 months.  Recent consult visits:  11/23/20- Neurology - Patient presented for follow up of encephalopathy syndrome. No medication changes.  Hospital visits:  None in previous 6 months  Medications: Outpatient Encounter Medications as of 05/08/2021  Medication Sig   acetaminophen (TYLENOL) 325 MG tablet Take 2 tablets (650 mg total) by mouth every 6 (six) hours as needed for mild pain or moderate pain.   albuterol (PROVENTIL HFA;VENTOLIN HFA) 108 (90 Base) MCG/ACT inhaler Inhale 2 puffs into the lungs every 6 (six) hours as needed for wheezing or shortness of breath.   Calcium Carbonate-Simethicone 750-80 MG CHEW Chew 1 tablet by mouth as needed (gas).    Cholecalciferol (VITAMIN D PO) Take 1 capsule by mouth daily.    furosemide (LASIX) 20 MG tablet Take 2 tablets by mouth once daily   glipiZIDE (GLUCOTROL) 5 MG tablet Take 1 tablet (5 mg total) by mouth 2 (two) times daily before a meal.   irbesartan (AVAPRO) 300 MG tablet Take 1 tablet by mouth once daily   ketoconazole (NIZORAL) 2 % shampoo APPLY TOPICALLY TO AFFECTED AREA ONCE DAILY AS NEEDED FOR IRRITATION   metoprolol succinate (TOPROL-XL) 25 MG 24 hr tablet Take 1 tablet by mouth once daily   Multiple Vitamins-Minerals (MULTIVITAMIN WITH MINERALS) tablet Take 1 tablet by mouth daily. (Patient not taking: Reported on 05/03/2021)   nitroGLYCERIN (NITROSTAT) 0.4 MG SL tablet Place 1 tablet (0.4 mg total) under the tongue every 5 (five) minutes x 3 doses as needed  for chest pain.   OXYGEN Inhale 2 L into the lungs continuous.    pravastatin (PRAVACHOL) 20 MG tablet Take 1 tablet by mouth once daily   tamsulosin (FLOMAX) 0.4 MG CAPS capsule Take 1 capsule by mouth once daily at bedtime   No facility-administered encounter medications on file as of 05/08/2021.    Lab Results  Component Value Date/Time   HGBA1C 8.5 (A) 05/03/2021 02:22 PM   HGBA1C 6.4 (A) 01/30/2021 11:44 AM   HGBA1C 6.9 (H) 06/10/2020 12:20 PM   HGBA1C 6.6 (H) 10/21/2019 11:42 AM   MICROALBUR <0.7 11/04/2018 12:28 PM     BP Readings from Last 3 Encounters:  05/03/21 132/80  01/30/21 130/60  11/23/20 (!) 105/58    Patient contacted to review initial questions prior to visit with Debbora Dus.  Have you seen any other providers since your last visit with PCP? No  Any changes in your medications or health? No the patient reports he has been off and on glipizide trying to manage the diabetes  Any side effects from any medications? No  Do you have an symptoms or problems not managed by your medications? No  Any concerns about your health right now? No  Has your provider asked that you check blood pressure, blood sugar, or follow special diet at home? The patient reports he is not on any special diet and he has started taking readings more often, reminded to have some available for appointment   Do you get any type of exercise  on a regular basis? No the patient is on oxygen and gets out of breath often  Can you think of a goal you would like to reach for your health? No  Do you have any problems getting your medications? No uses [employer]   Is there anything that you would like to discuss during the appointment? No   Alfred Carney was reminded to have all medications, supplements and any blood glucose and blood pressure readings available for review with Debbora Dus, Pharm. D, at his telephone visit on 05/14/21 at 11:00am.    Star Rating Drugs:   Medication:  Last Fill: Day Supply Glipizide '5mg'$   05/03/21 90 Pravastatin '20mg'$  04/03/21 90 Irbesartan '300mg'$  02/27/21 90   Care Gaps: Last annual wellness visit:07/25/2020 If Diabetic: Last eye exam / retinopathy screening:02/25/2019 Last diabetic foot exam:01/30/21  Debbora Dus, CPP notified  Avel Sensor, [place] Assistant [phone]  I have reviewed the care management and care coordination activities outlined in this encounter and I am certifying that I agree with the content of this note. No further action required.  Debbora Dus, PharmD Clinical Pharmacist [facility] [phone]

## 2021-05-14 ENCOUNTER — Ambulatory Visit (INDEPENDENT_AMBULATORY_CARE_PROVIDER_SITE_OTHER): Payer: Medicare HMO

## 2021-05-14 ENCOUNTER — Other Ambulatory Visit: Payer: Self-pay

## 2021-05-14 DIAGNOSIS — E7800 Pure hypercholesterolemia, unspecified: Secondary | ICD-10-CM | POA: Diagnosis not present

## 2021-05-14 DIAGNOSIS — N182 Chronic kidney disease, stage 2 (mild): Secondary | ICD-10-CM

## 2021-05-14 DIAGNOSIS — I10 Essential (primary) hypertension: Secondary | ICD-10-CM

## 2021-05-14 DIAGNOSIS — E1122 Type 2 diabetes mellitus with diabetic chronic kidney disease: Secondary | ICD-10-CM | POA: Diagnosis not present

## 2021-05-14 NOTE — Progress Notes (Signed)
Chronic Care Management Pharmacy Note  05/15/2021 Name:  Alfred Carney MRN:  [id number] DOB:  1950/11/27  Summary: Pt presented by telephone initial CCM.  Discussed DM, HTN, HLD. Last A1c up to 8.5%, home fasting BG 130-150 on average, checks about 2x/week. Seems to be improving back on glipizide. Home BP higher than clinic. Need to check accuracy of his home monitor. Room for some improvement in LDL (goal < 70). No adherence concerns identified. Sedentary. Reports his quality of life impacted by 24/7 oxygen. SOB with daily activities such as showering, walking to garage.  Recommendations/Changes made from today's visit: Take pravastatin in the evening instead of morning Bring BP monitor to next PCP visit  Plan: Follow up BG log in 30 days PCP October 2022 CCM visit 6 months  Subjective: Alfred Carney is an 70 y.o. year old male who is a primary patient of Cody, Jobe Marker, MD.  The CCM team was consulted for assistance with disease management and care coordination needs.    Engaged with patient by telephone for initial visit in response to provider referral for pharmacy case management and/or care coordination services.   Consent to Services:  The patient was given the following information about Chronic Care Management services today, agreed to services, and gave verbal consent: 1. CCM service includes personalized support from designated clinical staff supervised by the primary care provider, including individualized plan of care and coordination with other care providers 2. 24/7 contact phone numbers for assistance for urgent and routine care needs. 3. Service will only be billed when office clinical staff spend 20 minutes or more in a month to coordinate care. 4. Only one practitioner may furnish and bill the service in a calendar month. 5.The patient may stop CCM services at any time (effective at the end of the month) by phone call to the office staff. 6. The patient will be responsible  for cost sharing (co-pay) of up to 20% of the service fee (after annual deductible is met). Patient agreed to services and consent obtained.  Patient Care Team: Lesleigh Noe, MD as PCP - General (Family Medicine) [place], Peter M, MD as PCP - Cardiology (Cardiology) [place], Peter M, MD as Consulting Physician (Cardiology) Tanda Rockers, MD as Consulting Physician (Pulmonary Disease) Debbora Dus, [facility] as Pharmacist (Pharmacist)  Recent office visits:  05/03/21- PCP - Patient presented for follow up diabetes and hearing concern. Restart glipizide 5 mg BID. Follow up 2 months. 01/30/21- PCP - Patient presented for follow up diabetes. A1c 6.4%. Stopped Glipizide 42m and try diet alone. Follow up 3 months.   Recent consult visits:  11/23/20- Neurology - Patient presented for follow up of post-reversible encephalopathy syndrome. No repeat episodes. Blood pressure initially elevated but on recheck 105/58 manually -encouraged him to bring his home BP cuff to follow-up visit with PCP to compare his cuff with manual reading to ensure accuracy. Follow up 6 months.    Hospital visits: None in previous 6 months   Objective:  Lab Results  Component Value Date   CREATININE 1.35 01/30/2021   BUN 18 01/30/2021   GFR 52.97 (L) 01/30/2021   GFRNONAA >60 06/11/2020   GFRAA >60 06/11/2020   NA 144 01/30/2021   K 4.0 01/30/2021   CALCIUM 9.2 01/30/2021   CO2 28 01/30/2021   GLUCOSE 130 (H) 01/30/2021    Lab Results  Component Value Date/Time   HGBA1C 8.5 (A) 05/03/2021 02:22 PM   HGBA1C 6.4 (A) 01/30/2021 11:44 AM  HGBA1C 6.9 (H) 06/10/2020 12:20 PM   HGBA1C 6.6 (H) 10/21/2019 11:42 AM   GFR 52.97 (L) 01/30/2021 12:18 PM   GFR 48.27 (L) 06/22/2020 10:17 AM   MICROALBUR <0.7 11/04/2018 12:28 PM    Last diabetic Eye exam:  Lab Results  Component Value Date/Time   HMDIABEYEEXA No Retinopathy 04/25/2020 12:00 AM    Last diabetic Foot exam: 10/21/19 - normal   Lab Results  Component  Value Date   CHOL 147 06/10/2020   HDL 36 (L) 06/10/2020   LDLCALC 90 06/10/2020   TRIG 107 06/10/2020   CHOLHDL 4.1 06/10/2020    Hepatic Function Latest Ref Rng & Units 06/11/2020 06/10/2020 10/21/2019  Total Protein 6.5 - 8.1 g/dL 5.5(L) 6.2(L) 6.9  Albumin 3.5 - 5.0 g/dL 3.2(L) 3.6 4.3  AST 15 - 41 U/L 23 22 21   ALT 0 - 44 U/L 32 38 45  Alk Phosphatase 38 - 126 U/L 51 63 100  Total Bilirubin 0.3 - 1.2 mg/dL 1.0 0.7 0.6  Bilirubin, Direct 0.0 - 0.3 mg/dL - - -    Lab Results  Component Value Date/Time   TSH 3.74 11/19/2017 03:56 PM   TSH 2.020 01/28/2014 10:30 AM    CBC Latest Ref Rng & Units 06/11/2020 06/10/2020 06/10/2020  WBC 4.0 - 10.5 K/uL 6.0 - 7.0  Hemoglobin 13.0 - 17.0 g/dL 13.1 14.3 14.6  Hematocrit 39.0 - 52.0 % 40.7 42.0 45.1  Platelets 150 - 400 K/uL 152 - 164    No results found for: VD25OH  Clinical ASCVD: Yes  The 10-year ASCVD risk score Mikey Bussing DC Jr., et al., 2013) is: 40%   Values used to calculate the score:     Age: 9 years     Sex: Male     Is Non-Hispanic African American: No     Diabetic: Yes     Tobacco smoker: No     Systolic Blood Pressure: 409 mmHg     Is BP treated: Yes     HDL Cholesterol: 36 mg/dL     Total Cholesterol: 147 mg/dL    Depression screen [facility] 2/9 07/25/2020 06/22/2020 02/15/2019  Decreased Interest 0 0 0  Down, Depressed, Hopeless 0 0 0  PHQ - 2 Score 0 0 0  Altered sleeping 0 - 0  Tired, decreased energy 0 - 3  Change in appetite 0 - 0  Feeling bad or failure about yourself  0 - 0  Trouble concentrating 0 - 0  Moving slowly or fidgety/restless 0 - 0  Suicidal thoughts 0 - 0  PHQ-9 Score 0 - 3  Difficult doing work/chores Not difficult at all - Somewhat difficult    Social History   Tobacco Use  Smoking Status Former   Packs/day: 1.00   Years: 35.00   Pack years: 35.00   Types: Cigarettes   Quit date: 09/16/2006   Years since quitting: 14.6  Smokeless Tobacco Never   BP Readings from Last 3 Encounters:   05/03/21 132/80  01/30/21 130/60  11/23/20 (!) 105/58   Pulse Readings from Last 3 Encounters:  05/03/21 61  01/30/21 78  11/23/20 64   Wt Readings from Last 3 Encounters:  05/03/21 (!) 328 lb 12 oz (149.1 kg)  01/30/21 (!) 332 lb (150.6 kg)  11/23/20 (!) 325 lb (147.4 kg)   BMI Readings from Last 3 Encounters:  05/03/21 48.55 kg/m  01/30/21 49.03 kg/m  11/23/20 47.99 kg/m    Assessment/Interventions: Review of patient past medical history, allergies, medications,  health status, including review of consultants reports, laboratory and other test data, was performed as part of comprehensive evaluation and provision of chronic care management services.   SDOH:  (Social Determinants of Health) assessments and interventions performed: Yes SDOH Interventions    Flowsheet Row Most Recent Value  SDOH Interventions   Financial Strain Interventions Intervention Not Indicated      SDOH Screenings   Alcohol Screen: Low Risk    Last Alcohol Screening Score (AUDIT): 0  Depression (PHQ2-9): Low Risk    PHQ-2 Score: 0  Financial Resource Strain: Low Risk    Difficulty of Paying Living Expenses: Not hard at all  Food Insecurity: No Food Insecurity   Worried About Charity fundraiser in the Last Year: Never true   Ran Out of Food in the Last Year: Never true  Housing: Low Risk    Last Housing Risk Score: 0  Physical Activity: Sufficiently Active   Days of Exercise per Week: 7 days   Minutes of Exercise per Session: 30 min  Social Connections: Not on file  Stress: No Stress Concern Present   Feeling of Stress : Not at all  Tobacco Use: Medium Risk   Smoking Tobacco Use: Former   Smokeless Tobacco Use: Never  Transportation Needs: No Data processing manager (Medical): No   Lack of Transportation (Non-Medical): No    CCM Care Plan  No Known Allergies  Medications Reviewed Today     Reviewed by Debbora Dus, [facility] (Pharmacist) on 05/14/21 at 1129   Med List Status: <None>   Medication Order Taking? Sig Documenting Provider Last Dose Status Informant  acetaminophen (TYLENOL) 325 MG tablet [id number] Yes Take 2 tablets (650 mg total) by mouth every 6 (six) hours as needed for mild pain or moderate pain. Aline August, MD Taking Active   albuterol (PROVENTIL HFA;VENTOLIN HFA) 108 (90 Base) MCG/ACT inhaler [id number] Yes Inhale 2 puffs into the lungs every 6 (six) hours as needed for wheezing or shortness of breath. Tanda Rockers, MD Taking Active Spouse/Significant Other  Calcium Carbonate-Simethicone 750-80 MG CHEW [id number] Yes Chew 1 tablet by mouth as needed (gas).  [provider] Taking Active Spouse/Significant Other  Cholecalciferol (VITAMIN D PO) [id number] Yes Take 1,000 Units by mouth daily. [provider] Taking Active Self  furosemide (LASIX) 20 MG tablet [id number] Yes Take 2 tablets by mouth once daily Lesleigh Noe, MD Taking Active   glipiZIDE (GLUCOTROL) 5 MG tablet [id number] Yes Take 1 tablet (5 mg total) by mouth 2 (two) times daily before a meal. Lesleigh Noe, MD Taking Active   irbesartan (AVAPRO) 300 MG tablet [id number] Yes Take 1 tablet by mouth once daily Lesleigh Noe, MD Taking Active   ketoconazole (NIZORAL) 2 % shampoo [id number] Yes APPLY TOPICALLY TO AFFECTED AREA ONCE DAILY AS NEEDED FOR IRRITATION Lesleigh Noe, MD Taking Active   metoprolol succinate (TOPROL-XL) 25 MG 24 hr tablet [id number] Yes Take 1 tablet by mouth once daily Lesleigh Noe, MD Taking Active   nitroGLYCERIN (NITROSTAT) 0.4 MG SL tablet [id number] Yes Place 1 tablet (0.4 mg total) under the tongue every 5 (five) minutes x 3 doses as needed for chest pain. Lesleigh Noe, MD Taking Active Spouse/Significant Other  OXYGEN [id number] Yes Inhale 2 L into the lungs continuous.  [provider] Taking Active Spouse/Significant Other  pravastatin (PRAVACHOL) 20 MG tablet [id number] Yes Take 1 tablet by mouth once daily  Lesleigh Noe, MD Taking  Active   tamsulosin (FLOMAX) 0.4 MG CAPS capsule [id number] Yes Take 1 capsule by mouth once daily at bedtime Lesleigh Noe, MD Taking Active             Patient Active Problem List   Diagnosis Date Noted   Venous stasis dermatitis of both lower extremities 01/30/2021   Chronic diastolic heart failure ([place]) 06/22/2020   Posterior reversible encephalopathy syndrome 06/11/2020   Leukocytosis 10/21/2019   Abdominal aortic aneurysm, ruptured ([place]) 10/21/2019   Tailbone injury, initial encounter 02/15/2019   Chronic respiratory failure with hypoxia and hypercapnia ([place]) 02/05/2019   Dandruff in adult 11/04/2018   Benign prostatic hyperplasia 11/04/2018   Type 2 diabetes mellitus with stage 2 chronic kidney disease, without long-term current use of insulin ([place]) 11/04/2018   S/P discectomy 11/04/2018   Back pain 10/22/2018   Left hip pain 10/21/2018   Solitary pulmonary nodule on lung CT 01/29/2018   Polyp of cecum    Dyspnea on exertion 11/19/2017   Chronic respiratory failure with hypoxia ([place]) 11/19/2017   COPD GOLD II 05/17/2015   Morbid obesity due to excess calories ([place]) 05/17/2015   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  Updates made by Debbora Dus, [facility] since 05/15/2021 12:00 AM     Problem: Disease Management   Onset Date: 05/14/2021   Note:   Home BP elevated, unsure accuracy of home monitor as clinic readings low Last A1c elevated, improving back on glipizide LDL elevated with goal < 70  Pharmacist Clinical Goal(s):  Patient will contact provider office for questions/concerns as evidenced notation of same in electronic health record through collaboration with PharmD and provider.   Interventions: 1:1 collaboration with Lesleigh Noe, MD regarding development and update of comprehensive plan of care as evidenced by provider attestation and co-signature Inter-disciplinary care team collaboration (see longitudinal plan of care) Comprehensive medication review performed; medication list updated in electronic medical record  Hypertension (BP goal <130/80) -Controlled - in clinic -Current treatment: Metoprolol succinate 25 mg - 1 tablet daily Irbesartan 300 mg - 1 tablet daily Furosemide 20 mg - 2 tablet daily (pt takes differently: Alternates 1 tablet daily and 2 tablets daily every other day) -Medications previously tried: HCTZ - gout, amlodipine - swelling  -Current home readings:  He checked his BP after medicines today: 128/78 (this morning), usually 154/89-90 at home. He has been checking every 3 days since last PCP visit, 157/86, 154/90, 155/89 -He has not verified accuracy of his home monitor yet as recommended by neurology. He has an automatic monitor with arm cuff. Offered nursing visit to verify but he prefers to bring to next PCP visit. -Current dietary habits: none  -Current exercise habits: on 24/7 oxygen, taking a shower wears him out, low energy -Denies hypotensive/hypertensive symptoms; He has some dizziness when standing. He drinks 1-2, 16 ounces bottles water/day.  -Educated on BP goals and benefits of medications for prevention of heart attack, stroke and kidney damage; Proper BP monitoring  technique; -Counseled to monitor BP at home 2-3 days per week, document, and provide log at future  appointments -Recommended to continue current medication; Bring BP monitor and log to next visit.   Hyperlipidemia: (LDL goal < 70) -Not ideally controlled - LDL 90  -Current treatment: Pravastatin 20 mg - 1 tablet daily  -Medications previously tried: 02/21 - per pcp documentation, pt tried Atorvastatin (palpitations) and rosuvastatin (pain/swelling) with side effects. Did well on zetia.  -Educated on Cholesterol goals;  -Recommended to continue current medication; Take pravastatin in the evening instead of morning.  Diabetes (A1c goal <7%) -Not ideally controlled - Last A1c elevated (started glipizide after this) -Current medications: Glipizide 5 mg - 1 tablet BID with meals  -Medications previously tried: metformin - GI intolerance  -He takes glipizide with meals. -Current home glucose readings - he has checked four times since last PCP visit fasting glucose: 130-150  post prandial glucose: none -Denies hypoglycemic/hyperglycemic symptoms. Denies any sugars < 70.  -Current meal patterns: Vaughan Basta does most of the cooking. He reports diet could use some improvement. Very sedentary. -Educated on A1c and blood sugar goals; -Counseled to check feet daily and get yearly eye exams - He reports good sensation in feet. He does have some pain. Denies cuts, sores. Reports PCP checks feet every other visit. -H/o of cataract surgery on both eyes. He follows up routinely. Eye appt scheduled for Nov 2022. -Recommended to continue current medication; Follow up with PCP for A1c as scheduled.  Patient Goals/Self-Care Activities Patient will:  - take medications as prescribed  Follow Up Plan: Telephone follow up appointment with care management team member scheduled for:  6 months -CMA call for BG log in 30 days     Medication Assistance: None required.  Patient affirms current coverage meets needs.  Compliance/Adherence/Medication fill history: Care Gaps: Annual diabetic eye exam (pt has  scheduled Nov 2022)  Star-Rating Drugs: Medication:                Last Fill:         Day Supply Glipizide 28m              05/03/21            90 Pravastatin 233m       04/03/21            90 Irbesartan 30039m      02/27/21            90  Patient's [facility] pharmacy is:  [facility] [id number]: [phone]: [phone] pill box? Yes - he has 2 pillboxes one for morning and evening, fills every Sunday Pt endorses 100% compliance, denies missed doses   We discussed: Benefits of medication synchronization, packaging and delivery as well as enhanced pharmacist oversight with [employer]. Patient decided to: Continue current medication management strategy  Care Plan and Follow Up Patient Decision:  Patient agrees to Care Plan and Follow-up.  MicDebbora DusharmD Clinical Pharmacist [phone]

## 2021-05-15 ENCOUNTER — Other Ambulatory Visit: Payer: Self-pay | Admitting: Family Medicine

## 2021-05-15 DIAGNOSIS — E7800 Pure hypercholesterolemia, unspecified: Secondary | ICD-10-CM

## 2021-05-15 NOTE — Patient Instructions (Signed)
Dear Gerlene Fee,  It was a pleasure meeting you during our initial appointment on May 14, 2021. Below is a summary of the goals we discussed and components of chronic care management. Please contact me anytime with questions or concerns.   Visit Information  Patient Care Plan: CCM Pharmacy Care Plan     Problem Identified: Disease Management   Onset Date: 05/14/2021  Note:   Home BP elevated, unsure accuracy of home monitor as clinic readings low Last A1c elevated, improving back on glipizide LDL elevated with goal < 70  Pharmacist Clinical Goal(s):  Patient will contact provider office for questions/concerns as evidenced notation of same in electronic health record through collaboration with PharmD and provider.   Interventions: 1:1 collaboration with Lesleigh Noe, MD regarding development and update of comprehensive plan of care as evidenced by provider attestation and co-signature Inter-disciplinary care team collaboration (see longitudinal plan of care) Comprehensive medication review performed; medication list updated in electronic medical record  Hypertension (BP goal <130/80) -Controlled - in clinic -Current treatment: Metoprolol succinate 25 mg - 1 tablet daily Irbesartan 300 mg - 1 tablet daily Furosemide 20 mg - 2 tablet daily (pt takes differently: Alternates 1 tablet daily and 2 tablets daily every other day) -Medications previously tried: HCTZ - gout, amlodipine - swelling  -Current home readings:  He checked his BP after medicines today: 128/78 (this morning), usually 154/89-90 at home. He has been checking every 3 days since last PCP visit, 157/86, 154/90, 155/89 -He has not verified accuracy of his home monitor yet as recommended by neurology. He has an automatic monitor with arm cuff. Offered nursing visit to verify but he prefers to bring to next PCP visit. -Current dietary habits: none  -Current exercise habits: on 24/7 oxygen, taking a shower wears him  out, low energy -Denies hypotensive/hypertensive symptoms; He has some dizziness when standing. He drinks 1-2, 16 ounces bottles water/day.  -Educated on BP goals and benefits of medications for prevention of heart attack, stroke and kidney damage; Proper BP monitoring technique; -Counseled to monitor BP at home 2-3 days per week, document, and provide log at future appointments -Recommended to continue current medication; Bring BP monitor and log to next visit.   Hyperlipidemia: (LDL goal < 70) -Not ideally controlled - LDL 90  -Current treatment: Pravastatin 20 mg - 1 tablet daily  -Medications previously tried: 02/21 - per pcp documentation, pt tried Atorvastatin (palpitations) and rosuvastatin (pain/swelling) with side effects. Did well on zetia.  -Educated on Cholesterol goals;  -Recommended to continue current medication; Take pravastatin in the evening instead of morning.  Diabetes (A1c goal <7%) -Not ideally controlled - Last A1c elevated (started glipizide after this) -Current medications: Glipizide 5 mg - 1 tablet BID with meals  -Medications previously tried: metformin - GI intolerance  -He takes glipizide with meals. -Current home glucose readings - he has checked four times since last PCP visit fasting glucose: 130-150  post prandial glucose: none -Denies hypoglycemic/hyperglycemic symptoms. Denies any sugars < 70.  -Current meal patterns: Vaughan Basta does most of the cooking. He reports diet could use some improvement. Very sedentary. -Educated on A1c and blood sugar goals; -Counseled to check feet daily and get yearly eye exams - He reports good sensation in feet. He does have some pain. Denies cuts, sores. Reports PCP checks feet every other visit. -H/o of cataract surgery on both eyes. He follows up routinely. Eye appt scheduled for Nov 2022. -Recommended to continue current medication; Follow up with  PCP for A1c as scheduled.  Patient Goals/Self-Care Activities Patient  will:  - take medications as prescribed  Follow Up Plan: Telephone follow up appointment with care management team member scheduled for:  6 months -CMA call for BG log in 30 days     Mr. Jindra was given information about Chronic Care Management services today including:  CCM service includes personalized support from designated clinical staff supervised by his physician, including individualized plan of care and coordination with other care providers 24/7 contact phone numbers for assistance for urgent and routine care needs. Standard insurance, coinsurance, copays and deductibles apply for chronic care management only during months in which we provide at least 20 minutes of these services. Most insurances cover these services at 100%, however patients may be responsible for any copay, coinsurance and/or deductible if applicable. This service may help you avoid the need for more expensive face-to-face services. Only one practitioner may furnish and bill the service in a calendar month. The patient may stop CCM services at any time (effective at the end of the month) by phone call to the office staff.  Patient agreed to services and verbal consent obtained.   Patient verbalizes understanding of instructions provided today and agrees to view in [place].   Debbora Dus, PharmD Clinical Pharmacist [facility] [phone]

## 2021-05-17 ENCOUNTER — Other Ambulatory Visit: Payer: Self-pay

## 2021-05-17 MED ORDER — METOPROLOL SUCCINATE ER 25 MG PO TB24: 25.0000 mg | ORAL_TABLET | Freq: Every day | ORAL | 2 refills | Status: DC

## 2021-05-23 DIAGNOSIS — J449 Chronic obstructive pulmonary disease, unspecified: Secondary | ICD-10-CM | POA: Diagnosis not present

## 2021-05-23 DIAGNOSIS — R062 Wheezing: Secondary | ICD-10-CM | POA: Diagnosis not present

## 2021-05-24 ENCOUNTER — Telehealth: Payer: Self-pay

## 2021-05-24 NOTE — Progress Notes (Signed)
Chronic Care Management Pharmacy Assistant   Name: Alfred Carney  MRN: [id number] DOB: 02-14-1951  Reason for Encounter: Diabetes and Hypertension Disease State    Recent office visits:  None since last CCM visit  Recent consult visits:  None since last CCM visit  Hospital visits:  None since last CCM visit   Medications: Outpatient Encounter Medications as of 05/24/2021  Medication Sig   acetaminophen (TYLENOL) 325 MG tablet Take 2 tablets (650 mg total) by mouth every 6 (six) hours as needed for mild pain or moderate pain.   albuterol (PROVENTIL HFA;VENTOLIN HFA) 108 (90 Base) MCG/ACT inhaler Inhale 2 puffs into the lungs every 6 (six) hours as needed for wheezing or shortness of breath.   Calcium Carbonate-Simethicone 750-80 MG CHEW Chew 1 tablet by mouth as needed (gas).    Cholecalciferol (VITAMIN D PO) Take 1,000 Units by mouth daily.   furosemide (LASIX) 20 MG tablet Take 2 tablets by mouth once daily   glipiZIDE (GLUCOTROL) 5 MG tablet Take 1 tablet (5 mg total) by mouth 2 (two) times daily before a meal.   irbesartan (AVAPRO) 300 MG tablet Take 1 tablet by mouth once daily   ketoconazole (NIZORAL) 2 % shampoo APPLY TOPICALLY TO AFFECTED AREA ONCE DAILY AS NEEDED FOR IRRITATION   metoprolol succinate (TOPROL-XL) 25 MG 24 hr tablet Take 1 tablet (25 mg total) by mouth daily.   nitroGLYCERIN (NITROSTAT) 0.4 MG SL tablet Place 1 tablet (0.4 mg total) under the tongue every 5 (five) minutes x 3 doses as needed for chest pain.   OXYGEN Inhale 2 L into the lungs continuous.    pravastatin (PRAVACHOL) 20 MG tablet Take 1 tablet by mouth once daily   tamsulosin (FLOMAX) 0.4 MG CAPS capsule Take 1 capsule by mouth once daily at bedtime   No facility-administered encounter medications on file as of 05/24/2021.    Recent Relevant Labs: Lab Results  Component Value Date/Time   HGBA1C 8.5 (A) 05/03/2021 02:22 PM   HGBA1C 6.4 (A) 01/30/2021 11:44 AM   HGBA1C 6.9 (H) 06/10/2020 12:20  PM   HGBA1C 6.6 (H) 10/21/2019 11:42 AM   MICROALBUR <0.7 11/04/2018 12:28 PM    Kidney Function Lab Results  Component Value Date/Time   CREATININE 1.35 01/30/2021 12:18 PM   CREATININE 1.45 06/22/2020 10:17 AM   CREATININE 1.16 01/13/2015 09:36 AM   CREATININE 1.39 (H) 07/19/2014 11:56 AM   GFR 52.97 (L) 01/30/2021 12:18 PM   GFRNONAA >60 06/11/2020 07:54 AM   GFRNONAA 53 (L) 07/19/2014 11:56 AM   GFRAA >60 06/11/2020 07:54 AM   GFRAA 61 07/19/2014 11:56 AM    Contacted patient on 05/24/2021 to discuss diabetes disease state.   Current antihyperglycemic regimen:  Glipizide '5mg'$  - 1 Tablet BID with meals   Patient verbally confirms he is taking the above medications as directed. Yes  What diet changes have been made to improve diabetes control?  Patients states no changes to his diet.   What recent interventions/DTPs have been made to improve glycemic control: None noted   Have there been any recent hospitalizations or ED visits since last visit with CPP? No  Patient denies hypoglycemic symptoms, including Pale, Sweaty, Shaky, Hungry, Nervous/irritable, and Vision changes  Patient denies hyperglycemic symptoms, including blurry vision, excessive thirst, fatigue, polyuria, and weakness  How often are you checking your blood sugar? Patient states once a day that is usually in the morning around 11:00 am before he eats breakfast.   What are  your blood sugars ranging?  Fasting and before breakfast: 05/05/21 - 112 05/08/21 - 113 05/11/21 - 161 05/14/21 - 138 05/18/21 - 159 05/20/21 - 205 05/24/21 - 176 I asked patient if he would know of any reason that his readings were good until the 08/26 reading when they were elevated. Patient stated he did not know of a reason. I asked patient if he was eating sugary snacks before he went to bed. Patient stated he didn't know, but he could be.   During the week, how often does your blood glucose drop below 70? Never  Are you  checking your feet daily/regularly? Yes  Recent Office Vitals: BP Readings from Last 3 Encounters:  05/03/21 132/80  01/30/21 130/60  11/23/20 (!) 105/58   Pulse Readings from Last 3 Encounters:  05/03/21 61  01/30/21 78  11/23/20 64    Wt Readings from Last 3 Encounters:  05/03/21 (!) 328 lb 12 oz (149.1 kg)  01/30/21 (!) 332 lb (150.6 kg)  11/23/20 (!) 325 lb (147.4 kg)    Kidney Function Lab Results  Component Value Date/Time   CREATININE 1.35 01/30/2021 12:18 PM   CREATININE 1.45 06/22/2020 10:17 AM   CREATININE 1.16 01/13/2015 09:36 AM   CREATININE 1.39 (H) 07/19/2014 11:56 AM   GFR 52.97 (L) 01/30/2021 12:18 PM   GFRNONAA >60 06/11/2020 07:54 AM   GFRNONAA 53 (L) 07/19/2014 11:56 AM   GFRAA >60 06/11/2020 07:54 AM   GFRAA 61 07/19/2014 11:56 AM    BMP Latest Ref Rng & Units 01/30/2021 06/22/2020 06/11/2020  Glucose 70 - 99 mg/dL 130(H) 158(H) 105(H)  BUN 6 - 23 mg/dL 18 24(H) 11  Creatinine 0.40 - 1.50 mg/dL 1.35 1.45 1.19  Sodium 135 - 145 mEq/L 144 142 142  Potassium 3.5 - 5.1 mEq/L 4.0 3.7 3.6  Chloride 96 - 112 mEq/L 107 101 101  CO2 19 - 32 mEq/L '28 31 28  '$ Calcium 8.4 - 10.5 mg/dL 9.2 9.9 8.7(L)    Contacted patient on 05/24/2021 to discuss hypertension disease state  Current antihypertensive regimen:  Metoprolol succinate 25 mg - 1 tablet daily Irbesartan 300 mg - 1 tablet daily Furosemide 20 mg - 2 tablet daily (pt takes differently: Alternates 1 tablet daily and 2 tablets daily every other day)  Patient verbally confirms he is taking the above medications as directed. Yes and receives his medication from [facility].   How often are you checking your Blood Pressure? daily  he checks his blood pressure in the morning before taking his medication.  Current home BP readings:   DATE:             BP               PULSE 05/05/2021  157/86  Not recorded 05/08/2021  154/90  Not recorded 05/11/2021  155/89  Not recorded 05/14/2021  128/78  Not  recorded 05/18/2021  120/67  Not recorded 05/20/2021  126/62  Not recorded 05/24/2021  143/83  Not recorded   Wrist or arm cuff: Arm cuff - Patient will bring to his PCP office and have checked for accuracy. Caffeine intake: Very little Salt intake: Does not add salt  Any readings above 180/120? No If yes any symptoms of hypertensive emergency? None at time of call.   What recent interventions/DTPs have been made by any provider to improve Blood Pressure control since last CPP Visit: None since last CPP visit  Any recent hospitalizations or ED visits since last visit with CPP?  No  What diet changes have been made to improve Blood Pressure Control?  Patient states there aren't any.   What exercise is being done to improve your Blood Pressure Control?  Patients states he is on oxygen and gets winded.   Adherence Review: Is the patient currently on a STATIN medication? Yes Is the patient currently on ACE/ARB medication? Yes Does the patient have >5 day gap between last estimated fill dates? No  Care Gaps: Last annual wellness visit: 07/25/2020 Last eye exam / retinopathy screening: 02/25/2019 Last diabetic foot exam: 01/30/2021  Counseled patient on importance of annual eye and foot exam.   Star Rating Drugs:  Medication:  Last Fill: Day Supply Glipizide '5mg'$               05/04/2019            90 Pravastatin '20mg'$         04/03/2021            90 Irbesartan '300mg'$         05/15/2021            90  Neurology appointment on 9/19 @ 3:45pm PCP appointment on 10/24 @ 2:00pm  Charlene Brooke, CPP notified  Alfred Carney, [place] [phone]  Time Spent: 60 Minutes

## 2021-06-04 ENCOUNTER — Encounter: Payer: Self-pay | Admitting: Adult Health

## 2021-06-04 ENCOUNTER — Ambulatory Visit: Payer: Medicare HMO | Admitting: Adult Health

## 2021-06-04 VITALS — BP 126/72 | HR 63 | Wt 333.1 lb

## 2021-06-04 DIAGNOSIS — I6783 Posterior reversible encephalopathy syndrome: Secondary | ICD-10-CM

## 2021-06-04 NOTE — Progress Notes (Signed)
[facility] [address] [place] [address] [phone]       OFFICE FOLLOW UP NOTE  [name] Date of Birth:  23-Apr-1951 Medical Record Number:  [id number]   Referring MD:  Alfred Carney  Reason for Referral: PRES f/u   Chief Complaint  Patient presents with   Follow-up    Rm, 3 with wifeVaughan Carney)  here for 6 month f/u- Reports he has been doing ok since last visit. Denies any falls since his last visit. Reports no new concerns.        HPI:   Today, 06/04/2021, Alfred Carney returns for 60-monthfollow-up accompanied by his wife, Alfred Carney  Overall stable.  Denies new or reoccurring neurological symptoms.  Blood pressure today 126/72.  Routinely monitors at home and typically stable although slightly higher then levels at appointments - he plans to bring BP cuff with him at next visit with PCP to compare.  Continued use of 2 L 02 via [place] at all times.  No new concerns at this time.    History provided for reference purposes only Update 11/23/2020 JM: Mr. HKraushaarreturns for scheduled follow-up visit after initial consult visit approximately 4 months ago.  He is accompanied by his wife.  Stable since prior visit without new or reoccurring neurological symptoms Repeat MRI showed resolution of prior abnormalities Repeat EEG normal  Blood pressure today initially 151/75 (automatic) and recheck 105/58 (manual)  Routinely monitors at home and typically 145-150/70-75  No concerns at this time  Consult visit 07/20/2020 Alfred Carney Mr. HFieldhouseis a 70year old Caucasian male seen today for initial office consultation visit.  History is obtained from the patient and his wife is accompanying her as well as review of electronic medical records.  I personally reviewed available pertinent imaging films in PACS.Alfred Uleryis an 70y.o. male with a PMHx of COPD, CAD, DM2, GERD, fatty liver, HLD, HTN, morbid obesity, s/p repair of thoracoabdominal aortic aneurysm and  sleep apnea who presents to the ED from home via EMS as a Code Stroke. He was having a headache early this evening prior to taking a nap, but was otherwise at his baseline. On awakening at 11:30 PM, he complained to his wife that he could not see. His speech then rapidly became less intelligible. EMS was called and on arrival, the patient could only say "what" in response to questions and was not following commands. He did not seem to be agitated or in pain. En route his BP was 220/80 with CBG of 165. On arrival to the ED his BP had further increased to 240/114. He was completely noverbal on arrival to the ED but with eyes open. STAT CT head was obtained, which revealed no acute abnormality.  MRI scan however was highly suggestive of posterior reversible encephalopathy syndrome with T2 and FLAIR hyperintensities in the parieto-occipital cortex.  MRA of the brain was unremarkable.  Patient mental status improved as the blood pressure was gradually controlled but he had palinopsia and some vision dysfunction and speech also improved.  He was placed on long-term EEG monitoring which showed mild diffuse encephalopathy without definite epileptiform activity hence he was not started on anticonvulsants.  Echocardiogram was unremarkable.  Carotid ultrasound showed no significant extracranial stenosis.  LDL cholesterol was 90 mg percent.  Patient states is done well since discharge.  Is been on antihypertensives blood pressure is well controlled in fact today it is low it is 105/57 today.  He is able to  walk indoors without his assistance but uses a wheelchair for long distance as he gets short of breath because of his obesity.  He has been on home oxygen for his COPD for the last couple of years now.  He feels his mental status improved back to baseline and his visual dysfunction is also resolved.  He has no new complaints today.  He is scheduled to undergo cataract surgery in the first week of December.  He does have  obstructive sleep apnea but the patient refuses to wear his CPAP as he feels he cannot tolerate it     ROS:   14 system review of systems is positive for those listed in HPI and all other systems negative  PMH:  Past Medical History:  Diagnosis Date   Chronic obstructive pulmonary disease ([place]) 2008   Moderate   Colon polyps    Community acquired pneumonia    Coronary artery disease    Degenerative joint disease    Diabetes mellitus without complication ([place])    type 2   Emphysema lung ([place])    Enlarged liver    fatty liver by '09 CT   Gastroesophageal reflux disease    Gout    H/O hiatal hernia    History of Rocky [place] spotted fever    Possible history of [place] Spotted Fever   Hyperlipidemia    Hypertension    Hypokalemia    diuretic induced, resolved   Microcytic anemia    iron pills   Morbid obesity ([place])    Shortness of breath    Skin cancer    skin cancer lip removed   Sleep apnea    not currently using CPAP 05/20/13   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[place])    status post vascular surgery repair    Social History:  Social History   Socioeconomic History   Marital status: Married    Spouse name: Alfred Carney   Number of children: 4   Years of education: GED, ITT   Highest education level: Not on file  Occupational History   Occupation: architectural drawing-retired  Tobacco Use   Smoking status: Former    Packs/day: 1.00    Years: 35.00    Pack years: 35.00    Types: Cigarettes    Quit date: 09/16/2006    Years since quitting: 14.7   Smokeless tobacco: Never  Vaping Use   Vaping Use: Never used  Substance and Sexual Activity   Alcohol use: No    Alcohol/week: 0.0 standard drinks   Drug use: No   Sexual activity: Not Currently  Other Topics Concern   Not on file  Social History Narrative   Lives with wife   Right Handed   Drinks 4-5 cups caffeine daily   Social Determinants of Health   Financial Resource Strain: Low Risk    Difficulty of  Paying Living Expenses: Not hard at all  Food Insecurity: No Food Insecurity   Worried About Charity fundraiser in the Last Year: Never true   Ran Out of Food in the Last Year: Never true  Transportation Needs: No Transportation Needs   Lack of Transportation (Medical): No   Lack of Transportation (Non-Medical): No  Physical Activity: Sufficiently Active   Days of Exercise per Week: 7 days   Minutes of Exercise per Session: 30 min  Stress: No Stress Concern Present   Feeling of Stress : Not at all  Social Connections: Not on file  Intimate Partner Violence: Not At Risk  Fear of Current or Ex-Partner: No   Emotionally Abused: No   Physically Abused: No   Sexually Abused: No    Medications:   Current Outpatient Medications on File Prior to Visit  Medication Sig Dispense Refill   acetaminophen (TYLENOL) 325 MG tablet Take 2 tablets (650 mg total) by mouth every 6 (six) hours as needed for mild pain or moderate pain.     albuterol (PROVENTIL HFA;VENTOLIN HFA) 108 (90 Base) MCG/ACT inhaler Inhale 2 puffs into the lungs every 6 (six) hours as needed for wheezing or shortness of breath. 1 Inhaler 6   Calcium Carbonate-Simethicone 750-80 MG CHEW Chew 1 tablet by mouth as needed (gas).      Cholecalciferol (VITAMIN D PO) Take 1,000 Units by mouth daily.     furosemide (LASIX) 20 MG tablet Take 2 tablets by mouth once daily 180 tablet 0   glipiZIDE (GLUCOTROL) 5 MG tablet Take 1 tablet (5 mg total) by mouth 2 (two) times daily before a meal. 180 tablet 1   irbesartan (AVAPRO) 300 MG tablet Take 1 tablet by mouth once daily 90 tablet 1   ketoconazole (NIZORAL) 2 % shampoo APPLY TOPICALLY TO AFFECTED AREA ONCE DAILY AS NEEDED FOR IRRITATION 120 mL 0   metoprolol succinate (TOPROL-XL) 25 MG 24 hr tablet Take 1 tablet (25 mg total) by mouth daily. 90 tablet 2   nitroGLYCERIN (NITROSTAT) 0.4 MG SL tablet Place 1 tablet (0.4 mg total) under the tongue every 5 (five) minutes x 3 doses as needed for  chest pain. 25 tablet 0   OXYGEN Inhale 2 L into the lungs continuous.      pravastatin (PRAVACHOL) 20 MG tablet Take 1 tablet by mouth once daily 90 tablet 0   tamsulosin (FLOMAX) 0.4 MG CAPS capsule Take 1 capsule by mouth once daily at bedtime 90 capsule 3   No current facility-administered medications on file prior to visit.    Allergies:  No Known Allergies  Physical Exam Today's Vitals   06/04/21 1526  BP: 126/72  Pulse: 63  SpO2: 94%  Weight: (!) 333 lb 2 oz (151.1 kg)    Body mass index is 49.19 kg/m.   General: Morbidly obese pleasant elderly Caucasian male. 02 via [place], seated, in no evident distress Head: head normocephalic and atraumatic.   Neck: supple with no carotid or supraclavicular bruits Cardiovascular: regular rate and rhythm, no murmurs Musculoskeletal: no deformity Skin:  no rash/petichiae Vascular:  Normal pulses all extremities  Neurologic Exam Mental Status: Awake and fully alert.  Fluent speech and language.  Oriented to place and time. Recent and remote memory intact. Attention span, concentration and fund of knowledge appropriate. Mood and affect appropriate.  Cranial Nerves: Pupils equal, briskly reactive to light. Extraocular movements full without nystagmus. Visual fields full to confrontation. Hearing intact. Facial sensation intact. Face, tongue, palate moves normally and symmetrically.  Motor: Normal bulk and tone. Normal strength in all tested extremity muscles. Sensory.: intact to touch , pinprick , position and vibratory sensation.  Coordination: Rapid alternating movements normal in all extremities. Finger-to-nose and heel-to-shin performed accurately bilaterally. Gait and Station: Stands from seated position without difficulty.  Gait demonstrates normal stride length and balance although wide-based gait without use of assistive device Reflexes: 1+ and symmetric. Toes downgoing.      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     PRES -No residual side  effects or reoccurring symptoms -Discussed importance of strict blood pressure control with goal <130/90 and routine f/u with PCP for monitoring and management -MRI w/wo contrast 07/2020 resolution of prior abnormalities -EEG 07/2020 normal       CC:  [facility] provider: Dr. Vikki Ports, Jobe Marker, MD   I spent 23 minutes of face-to-face and non-face-to-face time with patient and wife.  This included previsit chart review, lab review, study review, electronic health record documentation, patient education and discussion regarding PRES, prevention and routine follow-up with PCP and answered all other questions to patient and wife satisfaction  Frann Rider, AGNP-BC  [facility] [address] [place], [place] [address]  Phone [phone] Fax [phone] Note: This document was prepared with digital dictation and possible smart phrase technology. Any transcriptional errors that result from this process are unintentional.

## 2021-06-04 NOTE — Patient Instructions (Signed)
Your Plan:  No changes today.  Continue to monitor blood pressure at home and routine follow-up with PCP for ongoing monitoring and management      Thank you for coming to see [name] at [facility]. I hope we have been able to provide you high quality care today.  You may receive a patient satisfaction survey over the next few weeks. We would appreciate your feedback and comments so that we may continue to improve ourselves and the health of our patients.

## 2021-06-05 NOTE — Progress Notes (Signed)
I agree with the above plan 

## 2021-06-22 DIAGNOSIS — R062 Wheezing: Secondary | ICD-10-CM | POA: Diagnosis not present

## 2021-06-22 DIAGNOSIS — J449 Chronic obstructive pulmonary disease, unspecified: Secondary | ICD-10-CM | POA: Diagnosis not present

## 2021-06-28 ENCOUNTER — Other Ambulatory Visit: Payer: Self-pay | Admitting: Family Medicine

## 2021-06-28 DIAGNOSIS — L210 Seborrhea capitis: Secondary | ICD-10-CM

## 2021-07-09 ENCOUNTER — Other Ambulatory Visit: Payer: Self-pay

## 2021-07-09 ENCOUNTER — Ambulatory Visit (INDEPENDENT_AMBULATORY_CARE_PROVIDER_SITE_OTHER): Payer: Medicare HMO | Admitting: Family Medicine

## 2021-07-09 VITALS — BP 130/70 | HR 71 | Temp 97.0°F | Ht 69.0 in | Wt 332.1 lb

## 2021-07-09 DIAGNOSIS — I10 Essential (primary) hypertension: Secondary | ICD-10-CM | POA: Diagnosis not present

## 2021-07-09 DIAGNOSIS — N182 Chronic kidney disease, stage 2 (mild): Secondary | ICD-10-CM

## 2021-07-09 DIAGNOSIS — I5032 Chronic diastolic (congestive) heart failure: Secondary | ICD-10-CM

## 2021-07-09 DIAGNOSIS — E1122 Type 2 diabetes mellitus with diabetic chronic kidney disease: Secondary | ICD-10-CM | POA: Diagnosis not present

## 2021-07-09 NOTE — Assessment & Plan Note (Signed)
Breathing well. Continue lasix 20 mg daily. Euvolemic

## 2021-07-09 NOTE — Progress Notes (Signed)
Subjective:     Alfred Carney is a 70 y.o. male presenting for Follow-up (DM )     HPI  #Diabetes Currently taking glipizide (Glucotrol)  Using medications without difficulties: No Hypoglycemic episodes:No  Hyperglycemic episodes:No  Feet problems:No  Blood Sugars averaging: 145-155 for fasting, rare 170 Last HgbA1c:  Lab Results  Component Value Date   HGBA1C 8.5 (A) 05/03/2021    Diabetes Health Maintenance Due:    Diabetes Health Maintenance Due  Topic Date Due   OPHTHALMOLOGY EXAM  04/25/2021   HEMOGLOBIN A1C  11/03/2021   FOOT EXAM  05/03/2022      Review of Systems  05/03/2021: Clinic - DM - restart glipizide and bring home readings  Social History   Tobacco Use  Smoking Status Former   Packs/day: 1.00   Years: 35.00   Pack years: 35.00   Types: Cigarettes   Quit date: 09/16/2006   Years since quitting: 14.8  Smokeless Tobacco Never        Objective:    BP Readings from Last 3 Encounters:  07/09/21 130/70  06/04/21 126/72  05/03/21 132/80   Wt Readings from Last 3 Encounters:  07/09/21 (!) 332 lb 2 oz (150.7 kg)  06/04/21 (!) 333 lb 2 oz (151.1 kg)  05/03/21 (!) 328 lb 12 oz (149.1 kg)    BP 130/70   Pulse 71   Temp (!) 97 F (36.1 C) (Temporal)   Ht 5\' 9"  (1.753 m)   Wt (!) 332 lb 2 oz (150.7 kg)   SpO2 96%   BMI 49.05 kg/m    Physical Exam Constitutional:      Appearance: Normal appearance. He is not ill-appearing or diaphoretic.  HENT:     Right Ear: External ear normal.     Left Ear: External ear normal.  Eyes:     General: No scleral icterus.    Extraocular Movements: Extraocular movements intact.     Conjunctiva/sclera: Conjunctivae normal.  Cardiovascular:     Rate and Rhythm: Normal rate and regular rhythm.     Heart sounds: Murmur heard.  Pulmonary:     Effort: Pulmonary effort is normal. No respiratory distress.     Breath sounds: Normal breath sounds. No wheezing.     Comments: On oxygen Musculoskeletal:      Cervical back: Neck supple.  Skin:    General: Skin is warm and dry.  Neurological:     Mental Status: He is alert. Mental status is at baseline.  Psychiatric:        Mood and Affect: Mood normal.          Assessment & Plan:   Problem List Items Addressed This Visit       Cardiovascular and Mediastinum   Essential hypertension - Primary    BP controlled. Cont irbesartan 300 mg      Chronic diastolic heart failure (HCC)    Breathing well. Continue lasix 20 mg daily. Euvolemic        Endocrine   Type 2 diabetes mellitus with stage 2 chronic kidney disease, without long-term current use of insulin (HCC)    Home CBG are improved <145-155 given other risk factors this is reasonable control. Continue diabetic diet, glipizide 5 mg BID. Return 4 months        Return in about 4 months (around 11/09/2021) for medicare wellness visit/follow-up.  Lesleigh Noe, MD  This visit occurred during the SARS-CoV-2 public health emergency.  Safety protocols were in place, including screening  questions prior to the visit, additional usage of staff PPE, and extensive cleaning of exam room while observing appropriate contact time as indicated for disinfecting solutions.

## 2021-07-09 NOTE — Assessment & Plan Note (Signed)
BP controlled. Cont irbesartan 300 mg

## 2021-07-09 NOTE — Assessment & Plan Note (Signed)
Home CBG are improved <145-155 given other risk factors this is reasonable control. Continue diabetic diet, glipizide 5 mg BID. Return 4 months

## 2021-07-09 NOTE — Patient Instructions (Signed)
Schedule medicare visit  Come back sooner if noticing sugars >170 regularly

## 2021-07-23 DIAGNOSIS — R062 Wheezing: Secondary | ICD-10-CM | POA: Diagnosis not present

## 2021-07-23 DIAGNOSIS — J449 Chronic obstructive pulmonary disease, unspecified: Secondary | ICD-10-CM | POA: Diagnosis not present

## 2021-07-31 DIAGNOSIS — H524 Presbyopia: Secondary | ICD-10-CM | POA: Diagnosis not present

## 2021-07-31 DIAGNOSIS — Z0100 Encounter for examination of eyes and vision without abnormal findings: Secondary | ICD-10-CM | POA: Diagnosis not present

## 2021-07-31 LAB — HM DIABETES EYE EXAM

## 2021-08-13 ENCOUNTER — Other Ambulatory Visit: Payer: Self-pay | Admitting: Family Medicine

## 2021-08-13 DIAGNOSIS — I10 Essential (primary) hypertension: Secondary | ICD-10-CM

## 2021-08-13 DIAGNOSIS — E7800 Pure hypercholesterolemia, unspecified: Secondary | ICD-10-CM

## 2021-08-14 NOTE — Telephone Encounter (Signed)
Dr. Verda Cumins last note says to take 20 mg of Lasix daily for which I suspect to be a typo.  Please clarify with patient regarding how many Lasix (furosemide) tablets he is taking daily, 1 or 2?

## 2021-08-14 NOTE — Telephone Encounter (Signed)
Last refilled on 05/17/21 #180 with 0 refill  LOV 07/09/21 follow up visit and next appointment scheduled for 11/12/20

## 2021-08-16 ENCOUNTER — Other Ambulatory Visit: Payer: Self-pay | Admitting: Family Medicine

## 2021-08-16 DIAGNOSIS — L210 Seborrhea capitis: Secondary | ICD-10-CM

## 2021-08-22 DIAGNOSIS — J449 Chronic obstructive pulmonary disease, unspecified: Secondary | ICD-10-CM | POA: Diagnosis not present

## 2021-08-22 DIAGNOSIS — R062 Wheezing: Secondary | ICD-10-CM | POA: Diagnosis not present

## 2021-08-31 ENCOUNTER — Telehealth: Payer: Self-pay

## 2021-08-31 NOTE — Chronic Care Management (AMB) (Addendum)
Chronic Care Management Pharmacy Assistant   Name: Alfred Carney  MRN: [id number] DOB: Mar 06, 1951 Reason for Encounter:  Diabetes  Disease State  Recent office visits:  07/09/21  -PCP - Patient presented for follow up diabetes. Home BP improving. No medication changes, return sooner if BG >170 regularly.   Recent consult visits:  06/04/21 - Neurology - Patient presented for follow up encephalopathy syndrome. No medication changes.  Hospital visits:  None in previous 6 months  Medications: Outpatient Encounter Medications as of 08/31/2021  Medication Sig   acetaminophen (TYLENOL) 325 MG tablet Take 2 tablets (650 mg total) by mouth every 6 (six) hours as needed for mild pain or moderate pain.   albuterol (PROVENTIL HFA;VENTOLIN HFA) 108 (90 Base) MCG/ACT inhaler Inhale 2 puffs into the lungs every 6 (six) hours as needed for wheezing or shortness of breath.   Calcium Carbonate-Simethicone 750-80 MG CHEW Chew 1 tablet by mouth as needed (gas).    Cholecalciferol (VITAMIN D PO) Take 1,000 Units by mouth daily.   furosemide (LASIX) 20 MG tablet Take 2 tablets by mouth once daily   glipiZIDE (GLUCOTROL) 5 MG tablet Take 1 tablet (5 mg total) by mouth 2 (two) times daily before a meal.   irbesartan (AVAPRO) 300 MG tablet Take 1 tablet by mouth once daily   ketoconazole (NIZORAL) 2 % shampoo APPLY TOPICALLY TO AFFECTED AREA ONCE DAILY AS NEEDED FOR IRRITATION   metoprolol succinate (TOPROL-XL) 25 MG 24 hr tablet Take 1 tablet (25 mg total) by mouth daily.   nitroGLYCERIN (NITROSTAT) 0.4 MG SL tablet Place 1 tablet (0.4 mg total) under the tongue every 5 (five) minutes x 3 doses as needed for chest pain.   OXYGEN Inhale 2 L into the lungs continuous.    pravastatin (PRAVACHOL) 20 MG tablet Take 1 tablet by mouth once daily   tamsulosin (FLOMAX) 0.4 MG CAPS capsule Take 1 capsule by mouth once daily at bedtime   No facility-administered encounter medications on file as of 08/31/2021.       Recent Relevant Labs: Lab Results  Component Value Date/Time   HGBA1C 8.5 (A) 05/03/2021 02:22 PM   HGBA1C 6.4 (A) 01/30/2021 11:44 AM   HGBA1C 6.9 (H) 06/10/2020 12:20 PM   HGBA1C 6.6 (H) 10/21/2019 11:42 AM   MICROALBUR <0.7 11/04/2018 12:28 PM    Kidney Function Lab Results  Component Value Date/Time   CREATININE 1.35 01/30/2021 12:18 PM   CREATININE 1.45 06/22/2020 10:17 AM   CREATININE 1.16 01/13/2015 09:36 AM   CREATININE 1.39 (H) 07/19/2014 11:56 AM   GFR 52.97 (L) 01/30/2021 12:18 PM   GFRNONAA >60 06/11/2020 07:54 AM   GFRNONAA 53 (L) 07/19/2014 11:56 AM   GFRAA >60 06/11/2020 07:54 AM   GFRAA 61 07/19/2014 11:56 AM     Contacted patient on 08/31/21 to discuss diabetes disease state.   Current antihyperglycemic regimen:   Glipizide 5mg  take 1 tablet 2 times daily    Patient verbally confirms he is taking the above medications as directed. Yes  What diet changes have been made to improve diabetes control? The patient reports he limits his sweets.  What recent interventions/DTPs have been made to improve glycemic control:     None identified  Have there been any recent hospitalizations or ED visits since last visit with CPP? No  Patient denies hypoglycemic symptoms, including Pale, Sweaty, Shaky, Hungry, Nervous/irritable, and Vision changes  Patient reports hyperglycemic symptoms, including  The patient reports he had a few dizzy episodes  over the past month. He would check both his BG and BP and reported normal readings. Informed to call for appointment with PCP if continues.   BP 117/80 (08/31/21)  How often are you checking your blood sugar? once daily  What are your blood sugars ranging?  Fasting: ranges  131,140, nothing> 150  During the week, how often does your blood glucose drop below 70? Never  Are you checking your feet daily/regularly? Yes  Adherence Review: Is the patient currently on a STATIN medication? Yes Is the patient currently  on ACE/ARB medication? Yes Does the patient have >5 day gap between last estimated fill dates? No  Care Gaps: Annual wellness visit in last year? No Most recent A1C reading:  8.5%  05/03/21 Most Recent BP reading: 130/70 71-P  07/09/21  Last eye exam / retinopathy screening: UTD Last diabetic foot exam: UTD  Counseled patient on importance of annual eye and foot exam. The patient reports he stays UTD  Star Rating Drugs:  Medication:  Last Fill: Day Supply Pravastatin 20mg  08/16/21 90 Irbesartan 300mg  08/14/21 90 Glipizide 5mg   08/14/21 90  No appointments scheduled within the next 30 days. PCP follow up - February CCM follow up - March  Debbora Dus, CPP notified  Avel Sensor, [place] Assistant [phone]  I have reviewed the care management and care coordination activities outlined in this encounter and I am certifying that I agree with the content of this note. No further action required.  Debbora Dus, PharmD Clinical Pharmacist [facility] [phone]

## 2021-09-22 DIAGNOSIS — J449 Chronic obstructive pulmonary disease, unspecified: Secondary | ICD-10-CM | POA: Diagnosis not present

## 2021-09-22 DIAGNOSIS — R062 Wheezing: Secondary | ICD-10-CM | POA: Diagnosis not present

## 2021-10-23 DIAGNOSIS — J449 Chronic obstructive pulmonary disease, unspecified: Secondary | ICD-10-CM | POA: Diagnosis not present

## 2021-10-23 DIAGNOSIS — R062 Wheezing: Secondary | ICD-10-CM | POA: Diagnosis not present

## 2021-11-11 ENCOUNTER — Other Ambulatory Visit: Payer: Self-pay | Admitting: Family Medicine

## 2021-11-11 DIAGNOSIS — N182 Chronic kidney disease, stage 2 (mild): Secondary | ICD-10-CM

## 2021-11-11 DIAGNOSIS — E1122 Type 2 diabetes mellitus with diabetic chronic kidney disease: Secondary | ICD-10-CM

## 2021-11-12 ENCOUNTER — Ambulatory Visit (INDEPENDENT_AMBULATORY_CARE_PROVIDER_SITE_OTHER): Payer: Medicare HMO | Admitting: Family Medicine

## 2021-11-12 ENCOUNTER — Other Ambulatory Visit: Payer: Self-pay

## 2021-11-12 VITALS — BP 130/72 | HR 64 | Temp 97.5°F | Ht 69.0 in | Wt 331.5 lb

## 2021-11-12 DIAGNOSIS — N182 Chronic kidney disease, stage 2 (mild): Secondary | ICD-10-CM

## 2021-11-12 DIAGNOSIS — I5032 Chronic diastolic (congestive) heart failure: Secondary | ICD-10-CM | POA: Diagnosis not present

## 2021-11-12 DIAGNOSIS — N400 Enlarged prostate without lower urinary tract symptoms: Secondary | ICD-10-CM

## 2021-11-12 DIAGNOSIS — H6123 Impacted cerumen, bilateral: Secondary | ICD-10-CM | POA: Diagnosis not present

## 2021-11-12 DIAGNOSIS — E7800 Pure hypercholesterolemia, unspecified: Secondary | ICD-10-CM

## 2021-11-12 DIAGNOSIS — E1122 Type 2 diabetes mellitus with diabetic chronic kidney disease: Secondary | ICD-10-CM

## 2021-11-12 LAB — POCT GLYCOSYLATED HEMOGLOBIN (HGB A1C): Hemoglobin A1C: 6.7 % — AB (ref 4.0–5.6)

## 2021-11-12 MED ORDER — TAMSULOSIN HCL 0.4 MG PO CAPS: 0.4000 mg | ORAL_CAPSULE | Freq: Every day | ORAL | 3 refills | Status: DC

## 2021-11-12 MED ORDER — FUROSEMIDE 20 MG PO TABS: 40.0000 mg | ORAL_TABLET | Freq: Every day | ORAL | 0 refills | Status: DC

## 2021-11-12 MED ORDER — GLIPIZIDE 5 MG PO TABS: 5.0000 mg | ORAL_TABLET | Freq: Two times a day (BID) | ORAL | 1 refills | Status: DC

## 2021-11-12 NOTE — Assessment & Plan Note (Signed)
Breathing well no concerns.  Continue Lasix 40 mg daily, irbesartan 300 mg, metoprolol 25 mg.

## 2021-11-12 NOTE — Assessment & Plan Note (Signed)
Stable continue tamsulosin 0.4 mg.

## 2021-11-12 NOTE — Progress Notes (Signed)
Subjective:     Alfred Carney is a 71 y.o. male presenting for Follow-up (68mo- DM )     HPI #Diabetes Currently taking glipizide (Glucotrol)  Using medications without difficulties: No Hypoglycemic episodes:   no Hyperglycemic episodes:No  Feet problems:No   Last HgbA1c:  Lab Results  Component Value Date   HGBA1C 6.7 (A) 11/12/2021    Diabetes Health Maintenance Due:    Diabetes Health Maintenance Due  Topic Date Due   OPHTHALMOLOGY EXAM  04/25/2021   FOOT EXAM  05/03/2022   HEMOGLOBIN A1C  05/12/2022   #Ears clogged up - hard to hear - worse over the 2 weeks - wife notes he is "messing with them"   No increased swelling or SOB Good urination with flomax  Review of Systems   Social History   Tobacco Use  Smoking Status Former   Packs/day: 1.00   Years: 35.00   Pack years: 35.00   Types: Cigarettes   Quit date: 09/16/2006   Years since quitting: 15.1  Smokeless Tobacco Never        Objective:    BP Readings from Last 3 Encounters:  11/12/21 130/72  07/09/21 130/70  06/04/21 126/72   Wt Readings from Last 3 Encounters:  11/12/21 (!) 331 lb 8 oz (150.4 kg)  07/09/21 (!) 332 lb 2 oz (150.7 kg)  06/04/21 (!) 333 lb 2 oz (151.1 kg)    BP 130/72    Pulse 64    Temp (!) 97.5 F (36.4 C) (Oral)    Ht 5\' 9"  (1.753 m)    Wt (!) 331 lb 8 oz (150.4 kg)    SpO2 97%    BMI 48.95 kg/m    Physical Exam Constitutional:      Appearance: Normal appearance. He is not ill-appearing or diaphoretic.  HENT:     Right Ear: External ear normal. There is impacted cerumen.     Left Ear: External ear normal. There is impacted cerumen.  Eyes:     General: No scleral icterus.    Extraocular Movements: Extraocular movements intact.     Conjunctiva/sclera: Conjunctivae normal.  Cardiovascular:     Rate and Rhythm: Normal rate and regular rhythm.  Pulmonary:     Effort: Pulmonary effort is normal. No respiratory distress.     Breath sounds: Normal breath sounds.  No wheezing.     Comments: On oxygen Musculoskeletal:     Cervical back: Neck supple.  Skin:    General: Skin is warm and dry.  Neurological:     Mental Status: He is alert. Mental status is at baseline.  Psychiatric:        Mood and Affect: Mood normal.          Assessment & Plan:   Problem List Items Addressed This Visit       Cardiovascular and Mediastinum   Chronic diastolic heart failure ([place]) - Primary    Breathing well no concerns.  Continue Lasix 40 mg daily, irbesartan 300 mg, metoprolol 25 mg.      Relevant Medications   furosemide (LASIX) 20 MG tablet   Other Relevant Orders   Comprehensive metabolic panel     Endocrine   Type 2 diabetes mellitus with stage 2 chronic kidney disease, without long-term current use of insulin (HCC)    Lab Results  Component Value Date   HGBA1C 6.7 (A) 11/12/2021  Significant improvement on glipizide 5 mg twice daily.  Continue diabetic diet and medication.  Relevant Medications   glipiZIDE (GLUCOTROL) 5 MG tablet   Other Relevant Orders   POCT glycosylated hemoglobin (Hb A1C) (Completed)   Comprehensive metabolic panel     Nervous and Auditory   Bilateral impacted cerumen    Complicated by hearing loss.  Discussed using drops at home as unable to fully clean out one ear with irrigation.  However noted improvement in hearing.          Genitourinary   Benign prostatic hyperplasia    Stable continue tamsulosin 0.4 mg.      Relevant Medications   tamsulosin (FLOMAX) 0.4 MG CAPS capsule     Other   Hyperlipidemia    On pravastatin 20 mg, recheck cholesterol today      Relevant Medications   furosemide (LASIX) 20 MG tablet   Other Relevant Orders   Lipid panel     Return in about 6 months (around 05/12/2022) for wellness with health nurse first.  Lesleigh Noe, MD  This visit occurred during the SARS-CoV-2 public health emergency.  Safety protocols were in place, including screening questions prior to  the visit, additional usage of staff PPE, and extensive cleaning of exam room while observing appropriate contact time as indicated for disinfecting solutions.

## 2021-11-12 NOTE — Patient Instructions (Signed)
Continue diabetes medications  Return in 6 months for wellness with health nurse first

## 2021-11-12 NOTE — Assessment & Plan Note (Addendum)
Complicated by hearing loss.  Discussed using drops at home as unable to fully clean out one ear with irrigation.  However noted improvement in hearing.

## 2021-11-12 NOTE — Assessment & Plan Note (Signed)
On pravastatin 20 mg, recheck cholesterol today

## 2021-11-12 NOTE — Assessment & Plan Note (Signed)
Lab Results  Component Value Date   HGBA1C 6.7 (A) 11/12/2021   Significant improvement on glipizide 5 mg twice daily.  Continue diabetic diet and medication.

## 2021-11-13 LAB — COMPREHENSIVE METABOLIC PANEL
ALT: 42 U/L (ref 0–53)
AST: 25 U/L (ref 0–37)
Albumin: 4.3 g/dL (ref 3.5–5.2)
Alkaline Phosphatase: 81 U/L (ref 39–117)
BUN: 19 mg/dL (ref 6–23)
CO2: 32 mEq/L (ref 19–32)
Calcium: 9.7 mg/dL (ref 8.4–10.5)
Chloride: 100 mEq/L (ref 96–112)
Creatinine, Ser: 1.49 mg/dL (ref 0.40–1.50)
GFR: 46.79 mL/min — ABNORMAL LOW (ref >60.00)
Glucose, Bld: 132 mg/dL — ABNORMAL HIGH (ref 70–99)
Potassium: 4.3 mEq/L (ref 3.5–5.1)
Sodium: 143 mEq/L (ref 135–145)
Total Bilirubin: 0.8 mg/dL (ref 0.2–1.2)
Total Protein: 7.4 g/dL (ref 6.0–8.3)

## 2021-11-13 LAB — LIPID PANEL
Cholesterol: 146 mg/dL (ref 0–200)
HDL: 34.9 mg/dL — ABNORMAL LOW (ref >39.00)
LDL Cholesterol: 87 mg/dL (ref 0–99)
NonHDL: 111.28
Total CHOL/HDL Ratio: 4
Triglycerides: 119 mg/dL (ref 0.0–149.0)
VLDL: 23.8 mg/dL (ref 0.0–40.0)

## 2021-11-20 DIAGNOSIS — R062 Wheezing: Secondary | ICD-10-CM | POA: Diagnosis not present

## 2021-11-20 DIAGNOSIS — J449 Chronic obstructive pulmonary disease, unspecified: Secondary | ICD-10-CM | POA: Diagnosis not present

## 2021-11-23 ENCOUNTER — Telehealth: Payer: Self-pay

## 2021-11-23 NOTE — Chronic Care Management (AMB) (Signed)
Transition CCM to Self Care ? ?Patient contacted to inform they have achieved their CCM goals and no longer need to be contacted as frequently. Patient advised services will still be available to them if they would like to reach out or have any new health concerns. Verified patient had contact information to pharmacist and health concierge on hand. Patient made aware CCM services would be continued if desired. Patient consented to cancel future CCM appointments.  ? ? ?Charlene Brooke, CPP notified ? ?Penina Reisner, CCMA ?Health concierge  ?[phone]  ?

## 2021-11-23 NOTE — Chronic Care Management (AMB) (Signed)
Transition CCM to Self Care ? ?Patient contacted to inform they have achieved their CCM goals and no longer need to be contacted as frequently. Patient advised services will still be available to them if they would like to reach out or have any new health concerns. Verified patient had contact information to pharmacist and health concierge on hand. Patient made aware CCM services would be continued if desired. Patient consented to cancel future CCM appointments.  ? ?Charlene Brooke, CPP notified ? ?Cheyenne Bordeaux, CCMA ?Health concierge  ?[phone]  ?

## 2021-11-27 ENCOUNTER — Telehealth: Payer: Medicare HMO

## 2021-11-28 ENCOUNTER — Telehealth: Payer: Medicare HMO

## 2021-12-13 ENCOUNTER — Encounter: Payer: Self-pay | Admitting: Family Medicine

## 2021-12-21 DIAGNOSIS — R062 Wheezing: Secondary | ICD-10-CM | POA: Diagnosis not present

## 2021-12-21 DIAGNOSIS — J449 Chronic obstructive pulmonary disease, unspecified: Secondary | ICD-10-CM | POA: Diagnosis not present

## 2022-01-20 DIAGNOSIS — R062 Wheezing: Secondary | ICD-10-CM | POA: Diagnosis not present

## 2022-01-20 DIAGNOSIS — J449 Chronic obstructive pulmonary disease, unspecified: Secondary | ICD-10-CM | POA: Diagnosis not present

## 2022-02-05 ENCOUNTER — Other Ambulatory Visit: Payer: Self-pay | Admitting: Family Medicine

## 2022-02-05 DIAGNOSIS — I5032 Chronic diastolic (congestive) heart failure: Secondary | ICD-10-CM

## 2022-02-20 DIAGNOSIS — J449 Chronic obstructive pulmonary disease, unspecified: Secondary | ICD-10-CM | POA: Diagnosis not present

## 2022-02-20 DIAGNOSIS — E1151 Type 2 diabetes mellitus with diabetic peripheral angiopathy without gangrene: Secondary | ICD-10-CM | POA: Diagnosis not present

## 2022-02-20 DIAGNOSIS — Z6841 Body Mass Index (BMI) 40.0 and over, adult: Secondary | ICD-10-CM | POA: Diagnosis not present

## 2022-02-20 DIAGNOSIS — B360 Pityriasis versicolor: Secondary | ICD-10-CM | POA: Diagnosis not present

## 2022-02-20 DIAGNOSIS — E785 Hyperlipidemia, unspecified: Secondary | ICD-10-CM | POA: Diagnosis not present

## 2022-02-20 DIAGNOSIS — G8929 Other chronic pain: Secondary | ICD-10-CM | POA: Diagnosis not present

## 2022-02-20 DIAGNOSIS — I25119 Atherosclerotic heart disease of native coronary artery with unspecified angina pectoris: Secondary | ICD-10-CM | POA: Diagnosis not present

## 2022-02-20 DIAGNOSIS — I509 Heart failure, unspecified: Secondary | ICD-10-CM | POA: Diagnosis not present

## 2022-02-20 DIAGNOSIS — I700 Atherosclerosis of aorta: Secondary | ICD-10-CM | POA: Diagnosis not present

## 2022-02-20 DIAGNOSIS — R062 Wheezing: Secondary | ICD-10-CM | POA: Diagnosis not present

## 2022-02-20 DIAGNOSIS — G4733 Obstructive sleep apnea (adult) (pediatric): Secondary | ICD-10-CM | POA: Diagnosis not present

## 2022-02-20 DIAGNOSIS — I110 Hypertensive heart disease with heart failure: Secondary | ICD-10-CM | POA: Diagnosis not present

## 2022-02-20 DIAGNOSIS — J439 Emphysema, unspecified: Secondary | ICD-10-CM | POA: Diagnosis not present

## 2022-03-22 DIAGNOSIS — J449 Chronic obstructive pulmonary disease, unspecified: Secondary | ICD-10-CM | POA: Diagnosis not present

## 2022-03-22 DIAGNOSIS — R062 Wheezing: Secondary | ICD-10-CM | POA: Diagnosis not present

## 2022-04-08 ENCOUNTER — Other Ambulatory Visit: Payer: Self-pay | Admitting: Family Medicine

## 2022-04-08 DIAGNOSIS — L210 Seborrhea capitis: Secondary | ICD-10-CM

## 2022-04-22 DIAGNOSIS — J449 Chronic obstructive pulmonary disease, unspecified: Secondary | ICD-10-CM | POA: Diagnosis not present

## 2022-04-22 DIAGNOSIS — R062 Wheezing: Secondary | ICD-10-CM | POA: Diagnosis not present

## 2022-05-03 ENCOUNTER — Other Ambulatory Visit: Payer: Self-pay | Admitting: Family Medicine

## 2022-05-03 DIAGNOSIS — I10 Essential (primary) hypertension: Secondary | ICD-10-CM

## 2022-05-03 DIAGNOSIS — E7800 Pure hypercholesterolemia, unspecified: Secondary | ICD-10-CM

## 2022-05-03 DIAGNOSIS — N182 Chronic kidney disease, stage 2 (mild): Secondary | ICD-10-CM

## 2022-05-08 ENCOUNTER — Ambulatory Visit (INDEPENDENT_AMBULATORY_CARE_PROVIDER_SITE_OTHER): Payer: Medicare HMO

## 2022-05-08 VITALS — Ht 69.0 in | Wt 331.0 lb

## 2022-05-08 DIAGNOSIS — E1159 Type 2 diabetes mellitus with other circulatory complications: Secondary | ICD-10-CM | POA: Insufficient documentation

## 2022-05-08 DIAGNOSIS — Z0000 Encounter for general adult medical examination without abnormal findings: Secondary | ICD-10-CM

## 2022-05-08 DIAGNOSIS — E119 Type 2 diabetes mellitus without complications: Secondary | ICD-10-CM | POA: Insufficient documentation

## 2022-05-08 DIAGNOSIS — E1169 Type 2 diabetes mellitus with other specified complication: Secondary | ICD-10-CM | POA: Insufficient documentation

## 2022-05-08 DIAGNOSIS — M169 Osteoarthritis of hip, unspecified: Secondary | ICD-10-CM | POA: Insufficient documentation

## 2022-05-08 DIAGNOSIS — K449 Diaphragmatic hernia without obstruction or gangrene: Secondary | ICD-10-CM | POA: Insufficient documentation

## 2022-05-08 NOTE — Progress Notes (Signed)
Virtual Visit via Telephone Note  I connected with  Alfred Carney on 05/08/22 at 10:30 AM EDT by telephone and verified that I am speaking with the correct person using two identifiers.  Location: Patient: home Provider: [place] Persons participating in the virtual visit: [place]   I discussed the limitations, risks, security and privacy concerns of performing an evaluation and management service by telephone and the availability of in person appointments. The patient expressed understanding and agreed to proceed.  Interactive audio and video telecommunications were attempted between this nurse and patient, however failed, due to patient having technical difficulties OR patient did not have access to video capability.  We continued and completed visit with audio only.  Some vital signs may be absent or patient reported.   Dionisio David, LPN  Subjective:   Alfred Carney is a 71 y.o. male who presents for Medicare Annual/Subsequent preventive examination.  Review of Systems     Cardiac Risk Factors include: advanced age (>17mn, >>19women);diabetes mellitus;male gender;hypertension     Objective:    There were no vitals filed for this visit. There is no height or weight on file to calculate BMI.     05/08/2022   10:40 AM 07/25/2020   12:05 PM 10/30/2018    7:20 AM 10/22/2018   11:06 PM 01/22/2018    8:39 AM 01/21/2018    8:45 AM 01/20/2018    2:37 PM  Advanced Directives  Does Patient Have a Medical Advance Directive? No No No No No No No  Copy of Healthcare Power of Attorney in Chart?      No - copy requested   Would patient like information on creating a medical advance directive? No - Patient declined No - Patient declined No - Patient declined No - Patient declined Yes (MAU/Ambulatory/Procedural Areas - Information given) No - Patient declined No - Patient declined    Current Medications (verified) Outpatient Encounter Medications as of 05/08/2022   Medication Sig   albuterol (PROVENTIL HFA;VENTOLIN HFA) 108 (90 Base) MCG/ACT inhaler Inhale 2 puffs into the lungs every 6 (six) hours as needed for wheezing or shortness of breath.   Calcium Carbonate-Simethicone 750-80 MG CHEW Chew 1 tablet by mouth as needed (gas).    Cholecalciferol (VITAMIN D PO) Take 1,000 Units by mouth daily.   furosemide (LASIX) 20 MG tablet Take 2 tablets by mouth once daily   glipiZIDE (GLUCOTROL) 5 MG tablet TAKE 1 TABLET BY MOUTH TWICE DAILY BEFORE A MEAL   irbesartan (AVAPRO) 300 MG tablet Take 1 tablet by mouth once daily   ketoconazole (NIZORAL) 2 % shampoo APPLY TOPICALLY TO AFFECTED AREA ONCE DAILY AS NEEDED FOR IRRITATION   metoprolol succinate (TOPROL-XL) 25 MG 24 hr tablet Take 1 tablet by mouth once daily   nitroGLYCERIN (NITROSTAT) 0.4 MG SL tablet Place 1 tablet (0.4 mg total) under the tongue every 5 (five) minutes x 3 doses as needed for chest pain.   OXYGEN Inhale 2 L into the lungs continuous.    pravastatin (PRAVACHOL) 20 MG tablet Take 1 tablet by mouth once daily   tamsulosin (FLOMAX) 0.4 MG CAPS capsule Take 1 capsule (0.4 mg total) by mouth at bedtime.   acetaminophen (TYLENOL) 325 MG tablet Take 2 tablets (650 mg total) by mouth every 6 (six) hours as needed for mild pain or moderate pain. (Patient not taking: Reported on 05/08/2022)   No facility-administered encounter medications on file as of 05/08/2022.    Allergies (verified) Patient  has no known allergies.   History: Past Medical History:  Diagnosis Date   Chronic obstructive pulmonary disease ([place]) 2008   Moderate   Colon polyps    Community acquired pneumonia    Coronary artery disease    Degenerative joint disease    Diabetes mellitus without complication ([place])    type 2   Emphysema lung ([place])    Enlarged liver    fatty liver by '09 CT   Gastroesophageal reflux disease    Gout    H/O hiatal hernia    History of Rocky [place] spotted fever    Possible history of [place] Spotted Fever   Hyperlipidemia    Hypertension    Hypokalemia    diuretic induced, resolved   Microcytic anemia    iron pills   Morbid obesity ([place])    Shortness of breath    Skin cancer    skin cancer lip removed   Sleep apnea    not currently using CPAP 05/20/13   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 LEVEL  10/23/2018       ESOPHAGOGASTRODUODENOSCOPY (EGD) WITH PROPOFOL N/A 01/22/2018   Procedure: ESOPHAGOGASTRODUODENOSCOPY (EGD) WITH PROPOFOL;  Surgeon: Mauri Pole, MD;  Location: [facility];  Service: Endoscopy;  Laterality: N/A;   HARDWARE REMOVAL Left 05/26/2013   Procedure: HARDWARE REMOVAL;  Surgeon: Newt Minion, MD;  Location: [place];  Service: Orthopedics;  Laterality: Left;  Left Total Hip Arthroplasty, Removal of Deep Hardware   HIP SURGERY     Status post left hip surgery with bone grafting   IR INJECT/THERA/INC NEEDLE/CATH/PLC EPI/LUMB/SAC W/IMG  10/23/2018   LEFT HEART CATHETERIZATION WITH CORONARY ANGIOGRAM N/A 01/31/2014   Procedure: LEFT HEART CATHETERIZATION WITH CORONARY ANGIOGRAM;  Surgeon: Burnell Blanks, MD;  Location: [facility] CATH LAB;  Service: Cardiovascular;  Laterality: N/A;   LUMBAR LAMINECTOMY/DECOMPRESSION MICRODISCECTOMY Left 10/28/2018   Procedure: Left Lumbar three-four Extraforaminal microdiscectomy;  Surgeon: Jovita Gamma, MD;  Location: [place];  Service: Neurosurgery;  Laterality: Left;   THORACOABDOMINAL AORTIC ANEURYSM REPAIR     with right femoral and left iliac BPG and reimplantation of renal arteries.   TONSILLECTOMY      TONSILLECTOMY     TOTAL HIP ARTHROPLASTY Left 05/26/2013   Procedure: TOTAL HIP ARTHROPLASTY;  Surgeon: Newt Minion, MD;  Location: [place];  Service: Orthopedics;  Laterality: Left;  Left Total Hip Arthroplasty, Removal Deep Hardware   Family History  Problem Relation Age of Onset   Osteoarthritis Mother    Diabetes Mother    Pulmonary embolism Mother    Heart disease Father        Coronary Artery Disease   Stroke Father    Multiple sclerosis Sister    Factor V Leiden deficiency Sister    Emphysema Sister    Hyperlipidemia Brother    Lung cancer Maternal Grandfather        smoked   Heart attack Maternal Grandmother    Social History   Socioeconomic History   Marital status: Married    Spouse name: Vaughan Basta   Number  of children: 4   Years of education: GED, ITT   Highest education level: Not on file  Occupational History   Occupation: architectural drawing-retired  Tobacco Use   Smoking status: Former    Packs/day: 1.00    Years: 35.00    Total pack years: 35.00    Types: Cigarettes    Quit date: 09/16/2006    Years since quitting: 15.6   Smokeless tobacco: Never  Vaping Use   Vaping Use: Never used  Substance and Sexual Activity   Alcohol use: No    Alcohol/week: 0.0 standard drinks of alcohol   Drug use: No   Sexual activity: Not Currently  Other Topics Concern   Not on file  Social History Narrative   Lives with wife   Right Handed   Drinks 4-5 cups caffeine daily   Social Determinants of Health   Financial Resource Strain: Low Risk  (05/08/2022)   Overall Financial Resource Strain (CARDIA)    Difficulty of Paying Living Expenses: Not hard at all  Food Insecurity: No Food Insecurity (05/08/2022)   Hunger Vital Sign    Worried About Running Out of Food in the Last Year: Never true    Ran Out of Food in the Last Year: Never true  Transportation Needs: No Transportation Needs (05/08/2022)   PRAPARE - Hydrologist (Medical): No     Lack of Transportation (Non-Medical): No  Physical Activity: Inactive (05/08/2022)   Exercise Vital Sign    Days of Exercise per Week: 0 days    Minutes of Exercise per Session: 0 min  Stress: No Stress Concern Present (07/25/2020)   [place]    Feeling of Stress : Not at all  Social Connections: Moderately Integrated (05/08/2022)   Social Connection and Isolation Panel [NHANES]    Frequency of Communication with Friends and Family: More than three times a week    Frequency of Social Gatherings with Friends and Family: Once a week    Attends Religious Services: Never    Marine scientist or Organizations: Yes    Attends Archivist Meetings: Never    Marital Status: Married    Tobacco Counseling Counseling given: Not Answered   Clinical Intake:  Pre-visit preparation completed: Yes  Pain : No/denies pain     Nutritional Risks: None Diabetes: Yes CBG done?: No Did pt. bring in CBG monitor from home?: No  How often do you need to have someone help you when you read instructions, pamphlets, or other written materials from your doctor or pharmacy?: 1 - Never  Diabetic?yes Nutrition Risk Assessment:  Has the patient had any N/V/D within the last 2 months?  No  Does the patient have any non-healing wounds?  No  Has the patient had any unintentional weight loss or weight gain?  No   Diabetes:  Is the patient diabetic?  Yes  If diabetic, was a CBG obtained today?  No  Did the patient bring in their glucometer from home?  No  How often do you monitor your CBG's? occcasionally.   Financial Strains and Diabetes Management:  Are you having any financial strains with the device, your supplies or your medication? No .  Does the patient want to be seen by Chronic Care Management for management of their diabetes?  No  Would the patient like to be referred to a Nutritionist or for Diabetic Management?   No   Diabetic Exams:  Diabetic Eye Exam: Completed 07/31/21. Pt has been advised about the importance in completing this exam.   Diabetic Foot Exam: Completed 05/03/21. Pt has been advised about the importance in completing this exam.   Interpreter Needed?: No  Information entered by :: Kirke Shaggy, LPN   Activities of Daily Living    05/08/2022   10:41 AM  In your present state of health, do you have any difficulty performing the following activities:  Hearing? 1  Vision? 0  Difficulty concentrating or making decisions? 0  Walking or climbing stairs? 1  Comment on O2  Dressing or bathing? 0  Doing errands, shopping? 0  Preparing Food and eating ? N  Using the Toilet? N  In the past six months, have you accidently leaked urine? N  Do you have problems with loss of bowel control? N  Managing your Medications? N  Managing your Finances? N  Housekeeping or managing your Housekeeping? N    Patient Care Team: Lesleigh Noe, MD as PCP - General (Family Medicine) [place], Peter M, MD as PCP - Cardiology (Cardiology) [place], Peter M, MD as Consulting Physician (Cardiology) Tanda Rockers, MD as Consulting Physician (Pulmonary Disease) Debbora Dus, [facility] as Pharmacist (Pharmacist)  Indicate any recent Medical Services you may have received from other than [facility] providers in the past year (date may be approximate).     Assessment:   This is a routine wellness examination for Alfred Carney.  Hearing/Vision screen Hearing Screening - Comments:: No aids Vision Screening - Comments:: Readers- Dr.McFarland  Dietary issues and exercise activities discussed: Current Exercise Habits: The patient does not participate in regular exercise at present   Goals Addressed             This Visit's Progress    DIET - EAT MORE FRUITS AND VEGETABLES         Depression Screen    05/08/2022   10:36 AM 07/25/2020   12:07 PM 06/22/2020   10:24 AM 02/15/2019   10:48 AM 01/20/2018    2:34 PM  01/19/2015   11:18 AM 07/19/2014   11:23 AM  PHQ 2/9 Scores  PHQ - 2 Score 0 0 0 0 1 0 0  PHQ- 9 Score 0 0  3 7      Fall Risk    05/08/2022   10:41 AM 07/25/2020   12:07 PM 01/20/2018    2:34 PM 01/19/2015   11:18 AM 07/19/2014   11:23 AM  Fall Risk   Falls in the past year? 0 0 Yes No No  Number falls in past yr: 0 0 2 or more    Injury with Fall? 0 0 Yes    Risk Factor Category    High Fall Risk    Risk for fall due to : No Fall Risks Medication side effect History of fall(s) Impaired balance/gait;Medication side effect   Follow up Falls evaluation completed Falls evaluation completed;Falls prevention discussed Falls evaluation completed;Education provided;Falls prevention discussed      FALL RISK PREVENTION PERTAINING TO THE HOME:  Any stairs in or around the home? Yes  If so, are there any without handrails? No  Home free of loose throw rugs in walkways, pet beds, electrical cords, etc? Yes  Adequate lighting in your home to reduce risk of falls? Yes   ASSISTIVE DEVICES UTILIZED TO PREVENT FALLS:  Life alert? No  Use of a cane, walker or w/c? No  Grab bars in the bathroom? Yes  Shower chair or bench  in shower? Yes  Elevated toilet seat or a handicapped toilet? Yes    Cognitive Function:    07/25/2020   12:10 PM  MMSE - Mini Mental State Exam  Orientation to time 5  Orientation to Place 5  Registration 3  Attention/ Calculation 5  Recall 3  Language- repeat 1        05/08/2022   10:47 AM  6CIT Screen  What Year? 0 points  What month? 0 points  What time? 0 points  Count back from 20 0 points  Months in reverse 0 points  Repeat phrase 2 points  Total Score 2 points    Immunizations Immunization History  Administered Date(s) Administered   PFIZER(Purple Top)SARS-COV-2 Vaccination 11/17/2019, 12/08/2019   Pneumococcal Conjugate-13 01/24/2015   Pneumococcal Polysaccharide-23 09/18/2011, 11/12/2016   Tdap 06/26/2010    TDAP status: Due, Education has been  provided regarding the importance of this vaccine. Advised may receive this vaccine at local pharmacy or Health Dept. Aware to provide a copy of the vaccination record if obtained from local pharmacy or Health Dept. Verbalized acceptance and understanding.  Flu Vaccine status: Declined, Education has been provided regarding the importance of this vaccine but patient still declined. Advised may receive this vaccine at local pharmacy or Health Dept. Aware to provide a copy of the vaccination record if obtained from local pharmacy or Health Dept. Verbalized acceptance and understanding.  Pneumococcal vaccine status: Up to date  Covid-19 vaccine status: Completed vaccines  Qualifies for Shingles Vaccine? Yes   Zostavax completed No   Shingrix Completed?: No.    Education has been provided regarding the importance of this vaccine. Patient has been advised to call insurance company to determine out of pocket expense if they have not yet received this vaccine. Advised may also receive vaccine at local pharmacy or Health Dept. Verbalized acceptance and understanding.  Screening Tests Health Maintenance  Topic Date Due   Zoster Vaccines- Shingrix (1 of 2) Never done   COVID-19 Vaccine (3 - Pfizer risk series) 01/05/2020   INFLUENZA VACCINE  04/16/2022   FOOT EXAM  05/03/2022   TETANUS/TDAP  07/25/2024 (Originally 06/26/2020)   HEMOGLOBIN A1C  05/12/2022   OPHTHALMOLOGY EXAM  07/31/2022   COLONOSCOPY (Pts 45-34yr Insurance coverage will need to be confirmed)  01/23/2028   Pneumonia Vaccine 72 Years old  Completed   Hepatitis C Screening  Completed   HPV VACCINES  Aged Out    Health Maintenance  Health Maintenance Due  Topic Date Due   Zoster Vaccines- Shingrix (1 of 2) Never done   COVID-19 Vaccine (3 - Pfizer risk series) 01/05/2020   INFLUENZA VACCINE  04/16/2022   FOOT EXAM  05/03/2022    Colorectal cancer screening: Type of screening: Colonoscopy. Completed 01/22/18. Repeat every 1  years- declined referral  Lung Cancer Screening: (Low Dose CT Chest recommended if Age 72-80years, 30 pack-year currently smoking OR have quit w/in 15years.) does not qualify.   Additional Screening:  Hepatitis C Screening: does qualify; Completed 11/04/18  Vision Screening: Recommended annual ophthalmology exams for early detection of glaucoma and other disorders of the eye. Is the patient up to date with their annual eye exam?  Yes  Who is the provider or what is the name of the office in which the patient attends annual eye exams? Dr.McFarland If pt is not established with a provider, would they like to be referred to a provider to establish care? No .   Dental Screening: Recommended annual dental  exams for proper oral hygiene  Community Resource Referral / Chronic Care Management: CRR required this visit?  No   CCM required this visit?  No      Plan:     I have personally reviewed and noted the following in the patient's chart:   Medical and social history Use of alcohol, tobacco or illicit drugs  Current medications and supplements including opioid prescriptions. Patient is not currently taking opioid prescriptions. Functional ability and status Nutritional status Physical activity Advanced directives List of other physicians Hospitalizations, surgeries, and ER visits in previous 12 months Vitals Screenings to include cognitive, depression, and falls Referrals and appointments  In addition, I have reviewed and discussed with patient certain preventive protocols, quality metrics, and best practice recommendations. A written personalized care plan for preventive services as well as general preventive health recommendations were provided to patient.     Dionisio David, LPN   7/35/3299   Nurse Notes: none

## 2022-05-08 NOTE — Patient Instructions (Signed)
[name] , Thank you for taking time to come for your Medicare Wellness Visit. I appreciate your ongoing commitment to your health goals. Please review the following plan we discussed and let me know if I can assist you in the future.   Screening recommendations/referrals: Colonoscopy: 01/22/18, declined referral Recommended yearly ophthalmology/optometry visit for glaucoma screening and checkup Recommended yearly dental visit for hygiene and checkup  Vaccinations: Influenza vaccine: n/d Pneumococcal vaccine: 11/12/16 Tdap vaccine: 06/26/10, due if have injury Shingles vaccine: n/d   Covid-19: 11/17/19, 12/08/19  Advanced directives: no  Conditions/risks identified: eat more veggies/fruits    Next appointment: Follow up in one year for your annual wellness visit. 05/12/23 @ 10am by phone  Preventive Care 65 Years and Older, Male Preventive care refers to lifestyle choices and visits with your health care provider that can promote health and wellness. What does preventive care include? A yearly physical exam. This is also called an annual well check. Dental exams once or twice a year. Routine eye exams. Ask your health care provider how often you should have your eyes checked. Personal lifestyle choices, including: Daily care of your teeth and gums. Regular physical activity. Eating a healthy diet. Avoiding tobacco and drug use. Limiting alcohol use. Practicing safe sex. Taking low doses of aspirin every day. Taking vitamin and mineral supplements as recommended by your health care provider. What happens during an annual well check? The services and screenings done by your health care provider during your annual well check will depend on your age, overall health, lifestyle risk factors, and family history of disease. Counseling  Your health care provider may ask you questions about your: Alcohol use. Tobacco use. Drug use. Emotional well-being. Home and relationship  well-being. Sexual activity. Eating habits. History of falls. Memory and ability to understand (cognition). Work and work Statistician. Screening  You may have the following tests or measurements: Height, weight, and BMI. Blood pressure. Lipid and cholesterol levels. These may be checked every 5 years, or more frequently if you are over 68 years old. Skin check. Lung cancer screening. You may have this screening every year starting at age 6 if you have a 30-pack-year history of smoking and currently smoke or have quit within the past 15 years. Fecal occult blood test (FOBT) of the stool. You may have this test every year starting at age 12. Flexible sigmoidoscopy or colonoscopy. You may have a sigmoidoscopy every 5 years or a colonoscopy every 10 years starting at age 40. Prostate cancer screening. Recommendations will vary depending on your family history and other risks. Hepatitis C blood test. Hepatitis B blood test. Sexually transmitted disease (STD) testing. Diabetes screening. This is done by checking your blood sugar (glucose) after you have not eaten for a while (fasting). You may have this done every 1-3 years. Abdominal aortic aneurysm (AAA) screening. You may need this if you are a current or former smoker. Osteoporosis. You may be screened starting at age 41 if you are at high risk. Talk with your health care provider about your test results, treatment options, and if necessary, the need for more tests. Vaccines  Your health care provider may recommend certain vaccines, such as: Influenza vaccine. This is recommended every year. Tetanus, diphtheria, and acellular pertussis (Tdap, Td) vaccine. You may need a Td booster every 10 years. Zoster vaccine. You may need this after age 37. Pneumococcal 13-valent conjugate (PCV13) vaccine. One dose is recommended after age 65. Pneumococcal polysaccharide (PPSV23) vaccine. One dose is recommended  after age 92. Talk to your health care  provider about which screenings and vaccines you need and how often you need them. This information is not intended to replace advice given to you by your health care provider. Make sure you discuss any questions you have with your health care provider. Document Released: 09/29/2015 Document Revised: 05/22/2016 Document Reviewed: 07/04/2015 Elsevier Interactive Patient Education  2017 [place] Prevention in the Home Falls can cause injuries. They can happen to people of all ages. There are many things you can do to make your home safe and to help prevent falls. What can I do on the outside of my home? Regularly fix the edges of walkways and driveways and fix any cracks. Remove anything that might make you trip as you walk through a door, such as a raised step or threshold. Trim any bushes or trees on the path to your home. Use bright outdoor lighting. Clear any walking paths of anything that might make someone trip, such as rocks or tools. Regularly check to see if handrails are loose or broken. Make sure that both sides of any steps have handrails. Any raised decks and porches should have guardrails on the edges. Have any leaves, snow, or ice cleared regularly. Use sand or salt on walking paths during winter. Clean up any spills in your garage right away. This includes oil or grease spills. What can I do in the bathroom? Use night lights. Install grab bars by the toilet and in the tub and shower. Do not use towel bars as grab bars. Use non-skid mats or decals in the tub or shower. If you need to sit down in the shower, use a plastic, non-slip stool. Keep the floor dry. Clean up any water that spills on the floor as soon as it happens. Remove soap buildup in the tub or shower regularly. Attach bath mats securely with double-sided non-slip rug tape. Do not have throw rugs and other things on the floor that can make you trip. What can I do in the bedroom? Use night lights. Make  sure that you have a light by your bed that is easy to reach. Do not use any sheets or blankets that are too big for your bed. They should not hang down onto the floor. Have a firm chair that has side arms. You can use this for support while you get dressed. Do not have throw rugs and other things on the floor that can make you trip. What can I do in the kitchen? Clean up any spills right away. Avoid walking on wet floors. Keep items that you use a lot in easy-to-reach places. If you need to reach something above you, use a strong step stool that has a grab bar. Keep electrical cords out of the way. Do not use floor polish or wax that makes floors slippery. If you must use wax, use non-skid floor wax. Do not have throw rugs and other things on the floor that can make you trip. What can I do with my stairs? Do not leave any items on the stairs. Make sure that there are handrails on both sides of the stairs and use them. Fix handrails that are broken or loose. Make sure that handrails are as long as the stairways. Check any carpeting to make sure that it is firmly attached to the stairs. Fix any carpet that is loose or worn. Avoid having throw rugs at the top or bottom of the stairs. If you  do have throw rugs, attach them to the floor with carpet tape. Make sure that you have a light switch at the top of the stairs and the bottom of the stairs. If you do not have them, ask someone to add them for you. What else can I do to help prevent falls? Wear shoes that: Do not have high heels. Have rubber bottoms. Are comfortable and fit you well. Are closed at the toe. Do not wear sandals. If you use a stepladder: Make sure that it is fully opened. Do not climb a closed stepladder. Make sure that both sides of the stepladder are locked into place. Ask someone to hold it for you, if possible. Clearly mark and make sure that you can see: Any grab bars or handrails. First and last steps. Where the  edge of each step is. Use tools that help you move around (mobility aids) if they are needed. These include: Canes. Walkers. Scooters. Crutches. Turn on the lights when you go into a dark area. Replace any light bulbs as soon as they burn out. Set up your furniture so you have a clear path. Avoid moving your furniture around. If any of your floors are uneven, fix them. If there are any pets around you, be aware of where they are. Review your medicines with your doctor. Some medicines can make you feel dizzy. This can increase your chance of falling. Ask your doctor what other things that you can do to help prevent falls. This information is not intended to replace advice given to you by your health care provider. Make sure you discuss any questions you have with your health care provider. Document Released: 06/29/2009 Document Revised: 02/08/2016 Document Reviewed: 10/07/2014 Elsevier Interactive Patient Education  2017 [employer].

## 2022-05-09 ENCOUNTER — Other Ambulatory Visit: Payer: Self-pay | Admitting: Family Medicine

## 2022-05-09 DIAGNOSIS — I5032 Chronic diastolic (congestive) heart failure: Secondary | ICD-10-CM

## 2022-05-15 ENCOUNTER — Ambulatory Visit (INDEPENDENT_AMBULATORY_CARE_PROVIDER_SITE_OTHER): Payer: Medicare HMO | Admitting: Family Medicine

## 2022-05-15 ENCOUNTER — Encounter: Payer: Medicare HMO | Admitting: Family Medicine

## 2022-05-15 VITALS — BP 90/62 | HR 84 | Temp 97.5°F | Ht 68.5 in | Wt 326.5 lb

## 2022-05-15 DIAGNOSIS — E785 Hyperlipidemia, unspecified: Secondary | ICD-10-CM | POA: Diagnosis not present

## 2022-05-15 DIAGNOSIS — M109 Gout, unspecified: Secondary | ICD-10-CM | POA: Diagnosis not present

## 2022-05-15 DIAGNOSIS — E1169 Type 2 diabetes mellitus with other specified complication: Secondary | ICD-10-CM

## 2022-05-15 DIAGNOSIS — Z0000 Encounter for general adult medical examination without abnormal findings: Secondary | ICD-10-CM

## 2022-05-15 DIAGNOSIS — I10 Essential (primary) hypertension: Secondary | ICD-10-CM | POA: Diagnosis not present

## 2022-05-15 DIAGNOSIS — N182 Chronic kidney disease, stage 2 (mild): Secondary | ICD-10-CM | POA: Diagnosis not present

## 2022-05-15 DIAGNOSIS — E1122 Type 2 diabetes mellitus with diabetic chronic kidney disease: Secondary | ICD-10-CM

## 2022-05-15 LAB — CBC
HCT: 42.2 % (ref 39.0–52.0)
Hemoglobin: 14 g/dL (ref 13.0–17.0)
MCHC: 33.2 g/dL (ref 30.0–36.0)
MCV: 91.2 fl (ref 78.0–100.0)
Platelets: 189 10*3/uL (ref 150.0–400.0)
RBC: 4.63 Mil/uL (ref 4.22–5.81)
RDW: 15.3 % (ref 11.5–15.5)
WBC: 10.2 10*3/uL (ref 4.0–10.5)

## 2022-05-15 LAB — COMPREHENSIVE METABOLIC PANEL
ALT: 30 U/L (ref 0–53)
AST: 20 U/L (ref 0–37)
Albumin: 3.9 g/dL (ref 3.5–5.2)
Alkaline Phosphatase: 71 U/L (ref 39–117)
BUN: 18 mg/dL (ref 6–23)
CO2: 29 mEq/L (ref 19–32)
Calcium: 9 mg/dL (ref 8.4–10.5)
Chloride: 104 mEq/L (ref 96–112)
Creatinine, Ser: 1.19 mg/dL (ref 0.40–1.50)
GFR: 61.07 mL/min (ref >60.00)
Glucose, Bld: 151 mg/dL — ABNORMAL HIGH (ref 70–99)
Potassium: 3.8 mEq/L (ref 3.5–5.1)
Sodium: 144 mEq/L (ref 135–145)
Total Bilirubin: 0.7 mg/dL (ref 0.2–1.2)
Total Protein: 6.9 g/dL (ref 6.0–8.3)

## 2022-05-15 LAB — LIPID PANEL
Cholesterol: 131 mg/dL (ref 0–200)
HDL: 35.9 mg/dL — ABNORMAL LOW (ref >39.00)
LDL Cholesterol: 77 mg/dL (ref 0–99)
NonHDL: 94.87
Total CHOL/HDL Ratio: 4
Triglycerides: 87 mg/dL (ref 0.0–149.0)
VLDL: 17.4 mg/dL (ref 0.0–40.0)

## 2022-05-15 LAB — URIC ACID: Uric Acid, Serum: 8.3 mg/dL — ABNORMAL HIGH (ref 4.0–7.8)

## 2022-05-15 LAB — HEMOGLOBIN A1C: Hgb A1c MFr Bld: 6.9 % — ABNORMAL HIGH (ref 4.6–6.5)

## 2022-05-15 NOTE — Patient Instructions (Signed)
Monitor at home and bring log Could consider decreasing medication  I will update after uric acid  Make an appointment at the beginning the severe joint with Dr. Lorelei Pont

## 2022-05-15 NOTE — Progress Notes (Signed)
Annual Exam   Chief Complaint:  Chief Complaint  Patient presents with   Medicare Wellness    Pt 2     History of Present Illness:  Alfred Carney is a 71 y.o. presents today for annual examination.    #Gout - getting more often - trying to find a trigger - taking pickled beet juice - tried pineapple - dad had similar issue - will get it in the ankle and toe - right leg - currently with a marble under the knee cap - started 3-4 days ago - easing off from last week - with toe pain severe with blanket  Nutrition/Lifestyle Diet: diabetes diet Exercise: limited due to breathing He is not sexually active.    Social History   Tobacco Use  Smoking Status Former   Packs/day: 1.00   Years: 35.00   Total pack years: 35.00   Types: Cigarettes   Quit date: 09/16/2006   Years since quitting: 15.6  Smokeless Tobacco Never   Social History   Substance and Sexual Activity  Alcohol Use No   Alcohol/week: 0.0 standard drinks of alcohol   Social History   Substance and Sexual Activity  Drug Use No     Safety The patient wears seatbelts: yes.     The patient feels safe at home and in their relationships: yes.  General Health Dentist in the last year: No - dentures Eye doctor: yes  Weight Wt Readings from Last 3 Encounters:  05/15/22 (!) 326 lb 8 oz (148.1 kg)  05/08/22 (!) 331 lb (150.1 kg)  11/12/21 (!) 331 lb 8 oz (150.4 kg)   Patient has very high BMI  BMI Readings from Last 1 Encounters:  05/15/22 48.92 kg/m     Chronic disease screening Blood pressure monitoring:  BP Readings from Last 3 Encounters:  05/15/22 90/62  11/12/21 130/72  07/09/21 130/70    Lipid Monitoring: Indication for screening: age >35, obesity, diabetes, family hx, CV risk factors.  Lipid screening: Yes  Lab Results  Component Value Date   CHOL 146 11/12/2021   HDL 34.90 (L) 11/12/2021   LDLCALC 87 11/12/2021   TRIG 119.0 11/12/2021   CHOLHDL 4 11/12/2021     Diabetes  Screening: age >104, overweight, family hx, PCOS, hx of gestational diabetes, at risk ethnicity, elevated blood pressure >135/80.  Diabetes Screening screening: Yes  Lab Results  Component Value Date   HGBA1C 6.7 (A) 11/12/2021     Prostate Cancer Screening: Not Indicated Age 41-69 yo Shared Decision Making Higher Risk: Older age, African American, Family Hx of Prostate Cancer - Yes Benefits: screening may prevent 1.3 deaths from prostate cancer over 13 years per 1000 men screened and prevent 3 metastatic cases per 1000 men screened. Not enough evidence to support more benefit for AA or [place] Harms: False Positive and psychological harms. 15% of me with false positive over a 2 to 4 year period > resulting in biopsy and complications such as pain, hematospermia, infections. Overdiagnosis - increases with age - found that 20-50% of prostate cancer through screening may have never caused any issues. Harms of treatment include - erectile dysfunction, urinary incontinence, and bothersome bowel symptoms.   After discussion he does not want to get a PSA checked today.   Inadequate evidence for screening <55 No mortality benefit for screening >70   No results found for: "PSA1", "PSA"     Colon Cancer Screening:  Age 30-75 yo - benefits outweigh the risk. Adults 43-85 yo who have  never been screened benefit.  Benefits: 134000 people in 2016 will be diagnosed and 49,000 will die - early detection helps Harms: Complications 2/2 to colonoscopy High Risk (Colonoscopy): genetic disorder (Lynch syndrome or familial adenomatous polyposis), personal hx of IBD, previous adenomatous polyp, or previous colorectal cancer, FamHx start 10 years before the age at diagnosis, increased in males and black race  Options:  FIT - looks for hemoglobin (blood in the stool) - specific and fairly sensitive - must be done annually Cologuard - looks for DNA and blood - more sensitive - therefore can have more false  positives, every 3 years Colonoscopy - every 10 years if normal - sedation, bowl prep, must have someone drive you  Shared decision making and the patient had decided to do colonoscopy 2019.   Social History   Tobacco Use  Smoking Status Former   Packs/day: 1.00   Years: 35.00   Total pack years: 35.00   Types: Cigarettes   Quit date: 09/16/2006   Years since quitting: 15.6  Smokeless Tobacco Never    Lung Cancer Screening (Ages 90-24): not applicable 20 year pack history? Yes Current Tobacco user? No Quit less than 15 years ago? Yes Interested in low dose CT for lung cancer screening? not applicable  Abdominal Aortic Aneurysm:  Age 52-75, 1 time screening, men who have ever smoked done    Past Medical History:  Diagnosis Date   Chronic obstructive pulmonary disease ([place]) 2008   Moderate   Colon polyps    Community acquired pneumonia    Coronary artery disease    Degenerative joint disease    Diabetes mellitus without complication ([place])    type 2   Emphysema lung ([place])    Enlarged liver    fatty liver by '09 CT   Gastroesophageal reflux disease    Gout    H/O hiatal hernia    History of [place] spotted fever    Possible history of [place] Spotted Fever   Hyperlipidemia    Hypertension    Hypokalemia    diuretic induced, resolved   Microcytic anemia    iron pills   Morbid obesity ([place])    Shortness of breath    Skin cancer    skin cancer lip removed   Sleep apnea    not currently using CPAP 05/20/13   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  LEVEL  10/23/2018       ESOPHAGOGASTRODUODENOSCOPY (EGD) WITH PROPOFOL N/A 01/22/2018   Procedure: ESOPHAGOGASTRODUODENOSCOPY (EGD) WITH PROPOFOL;  Surgeon: Mauri Pole, MD;  Location: [facility];  Service: Endoscopy;  Laterality: N/A;   HARDWARE REMOVAL Left 05/26/2013   Procedure: HARDWARE REMOVAL;  Surgeon: Newt Minion, MD;  Location: [facility];  Service: Orthopedics;  Laterality: Left;  Left Total Hip Arthroplasty, Removal of Deep Hardware   HIP SURGERY     Status post left hip surgery with bone grafting   IR INJECT/THERA/INC NEEDLE/CATH/PLC EPI/LUMB/SAC W/IMG  10/23/2018   LEFT HEART CATHETERIZATION WITH CORONARY ANGIOGRAM N/A 01/31/2014   Procedure: LEFT HEART CATHETERIZATION WITH CORONARY ANGIOGRAM;  Surgeon: Burnell Blanks, MD;  Location: [facility] CATH LAB;  Service: Cardiovascular;  Laterality: N/A;   LUMBAR LAMINECTOMY/DECOMPRESSION MICRODISCECTOMY Left 10/28/2018   Procedure: Left Lumbar three-four Extraforaminal microdiscectomy;  Surgeon: Jovita Gamma, MD;  Location: [facility];  Service: Neurosurgery;  Laterality: Left;   THORACOABDOMINAL AORTIC ANEURYSM REPAIR     with right femoral and left iliac BPG and reimplantation of renal arteries.   TONSILLECTOMY     TONSILLECTOMY     TOTAL HIP ARTHROPLASTY Left 05/26/2013   Procedure: TOTAL HIP ARTHROPLASTY;  Surgeon: Newt Minion, MD;  Location: [facility];  Service: Orthopedics;  Laterality: Left;  Left Total Hip Arthroplasty, Removal Deep Hardware    Prior to Admission medications   Medication Sig Start Date End Date Taking? Authorizing Provider  acetaminophen (TYLENOL) 325 MG tablet Take 2 tablets (650 mg total) by mouth every 6 (six) hours as needed for mild pain or moderate pain. 06/13/20  Yes Aline August, MD  albuterol (PROVENTIL HFA;VENTOLIN HFA) 108 (90 Base) MCG/ACT inhaler Inhale 2 puffs into the lungs every 6 (six) hours as needed for wheezing or shortness of breath. 01/06/18  Yes Tanda Rockers, MD  Calcium  Carbonate-Simethicone 750-80 MG CHEW Chew 1 tablet by mouth as needed (gas).    Yes [provider]  Cholecalciferol (VITAMIN D PO) Take 1,000 Units by mouth daily.   Yes [provider]  furosemide (LASIX) 20 MG tablet Take 2 tablets by mouth once daily 05/10/22  Yes Lesleigh Noe, MD  glipiZIDE (GLUCOTROL) 5 MG tablet TAKE 1 TABLET BY MOUTH TWICE DAILY BEFORE A MEAL 05/06/22  Yes Lesleigh Noe, MD  irbesartan (AVAPRO) 300 MG tablet Take 1 tablet by mouth once daily 05/06/22  Yes Lesleigh Noe, MD  ketoconazole (NIZORAL) 2 % shampoo APPLY TOPICALLY TO AFFECTED AREA ONCE DAILY AS NEEDED FOR IRRITATION 04/09/22  Yes Lesleigh Noe, MD  metoprolol succinate (TOPROL-XL) 25 MG 24 hr tablet Take 1 tablet by mouth once daily 05/10/22  Yes Lesleigh Noe, MD  nitroGLYCERIN (NITROSTAT) 0.4 MG SL tablet Place 1 tablet (0.4 mg total) under the tongue every 5 (five) minutes x 3 doses as needed for chest pain. 01/04/19  Yes Lesleigh Noe, MD  OXYGEN Inhale 2 L into the lungs continuous.    Yes [provider]  pravastatin (PRAVACHOL) 20 MG tablet Take 1 tablet by mouth once daily 05/06/22  Yes Lesleigh Noe, MD  tamsulosin (FLOMAX) 0.4 MG CAPS capsule Take 1 capsule (0.4 mg total) by mouth at bedtime. 11/12/21  Yes Lesleigh Noe, MD    No Known Allergies   Social History   Socioeconomic History   Marital status: Married    Spouse name: Vaughan Basta   Number of children: 4   Years of education: GED, ITT   Highest education level: Not on file  Occupational History   Occupation: architectural drawing-retired  Tobacco Use   Smoking status: Former    Packs/day: 1.00    Years: 35.00    Total pack years: 35.00    Types: Cigarettes    Quit date: 09/16/2006    Years since quitting: 15.6   Smokeless tobacco: Never  Vaping Use   Vaping Use: Never used  Substance and Sexual Activity   Alcohol use: No    Alcohol/week: 0.0 standard drinks of alcohol   Drug use: No   Sexual  activity: Not Currently  Other Topics Concern   Not on file  Social History Narrative  Lives with wife   Right Handed   Drinks 4-5 cups caffeine daily   Social Determinants of Health   Financial Resource Strain: Low Risk  (05/08/2022)   Overall Financial Resource Strain (CARDIA)    Difficulty of Paying Living Expenses: Not hard at all  Food Insecurity: No Food Insecurity (05/08/2022)   Hunger Vital Sign    Worried About Running Out of Food in the Last Year: Never true    Ran Out of Food in the Last Year: Never true  Transportation Needs: No Transportation Needs (05/08/2022)   PRAPARE - Hydrologist (Medical): No    Lack of Transportation (Non-Medical): No  Physical Activity: Inactive (05/08/2022)   Exercise Vital Sign    Days of Exercise per Week: 0 days    Minutes of Exercise per Session: 0 min  Stress: No Stress Concern Present (07/25/2020)   [place]    Feeling of Stress : Not at all  Social Connections: Moderately Integrated (05/08/2022)   Social Connection and Isolation Panel [NHANES]    Frequency of Communication with Friends and Family: More than three times a week    Frequency of Social Gatherings with Friends and Family: Once a week    Attends Religious Services: Never    Marine scientist or Organizations: Yes    Attends Archivist Meetings: Never    Marital Status: Married  Human resources officer Violence: Not At Risk (05/08/2022)   Humiliation, Afraid, Rape, and Kick questionnaire    Fear of Current or Ex-Partner: No    Emotionally Abused: No    Physically Abused: No    Sexually Abused: No    Family History  Problem Relation Age of Onset   Osteoarthritis Mother    Diabetes Mother    Pulmonary embolism Mother    Heart disease Father        Coronary Artery Disease   Stroke Father    Multiple sclerosis Sister    Factor V Leiden deficiency Sister     Emphysema Sister    Hyperlipidemia Brother    Lung cancer Maternal Grandfather        smoked   Heart attack Maternal Grandmother     Review of Systems  Constitutional:  Negative for chills and fever.  HENT:  Negative for congestion and sore throat.   Eyes:  Negative for blurred vision and double vision.  Respiratory:  Negative for shortness of breath.   Cardiovascular:  Negative for chest pain.  Gastrointestinal:  Negative for heartburn, nausea and vomiting.  Genitourinary: Negative.   Musculoskeletal:  Positive for joint pain. Negative for myalgias.  Skin:  Negative for rash.  Neurological:  Negative for dizziness and headaches.  Endo/Heme/Allergies:  Does not bruise/bleed easily.  Psychiatric/Behavioral:  Negative for depression. The patient is not nervous/anxious.      Physical Exam BP 90/62   Pulse 84   Temp (!) 97.5 F (36.4 C) (Temporal)   Ht 5' 8.5" (1.74 m)   Wt (!) 326 lb 8 oz (148.1 kg)   SpO2 93%   BMI 48.92 kg/m    BP Readings from Last 3 Encounters:  05/15/22 90/62  11/12/21 130/72  07/09/21 130/70      Physical Exam Constitutional:      General: He is not in acute distress.    Appearance: He is well-developed. He is obese. He is not diaphoretic.  HENT:     Head: Normocephalic and  atraumatic.     Right Ear: Tympanic membrane and ear canal normal.     Left Ear: Tympanic membrane and ear canal normal.     Nose: Nose normal.     Mouth/Throat:     Pharynx: Uvula midline.  Eyes:     General: No scleral icterus.    Conjunctiva/sclera: Conjunctivae normal.     Pupils: Pupils are equal, round, and reactive to light.  Cardiovascular:     Rate and Rhythm: Normal rate and regular rhythm.     Heart sounds: Normal heart sounds. No murmur heard. Pulmonary:     Effort: Pulmonary effort is normal. No respiratory distress.     Breath sounds: Normal breath sounds. No wheezing.     Comments: On oxygen Abdominal:     General: Bowel sounds are normal. There is  no distension.     Palpations: Abdomen is soft. There is no mass.     Tenderness: There is no abdominal tenderness. There is no guarding.  Musculoskeletal:        General: Normal range of motion.     Cervical back: Normal range of motion and neck supple.  Lymphadenopathy:     Cervical: No cervical adenopathy.  Skin:    General: Skin is warm and dry.     Capillary Refill: Capillary refill takes less than 2 seconds.  Neurological:     Mental Status: He is alert and oriented to person, place, and time.        Results:  PHQ-9:  Flowsheet Row Clinical Support from 05/08/2022 in [place] at [facility]  PHQ-9 Total Score 0         Assessment: 71 y.o. here for routine annual physical examination.  Plan: Problem List Items Addressed This Visit       Cardiovascular and Mediastinum   Essential hypertension   Relevant Orders   Comprehensive metabolic panel   CBC     Endocrine   Type 2 diabetes mellitus with stage 2 chronic kidney disease, without long-term current use of insulin (HCC)   Relevant Orders   Hemoglobin A1c   Hyperlipidemia associated with type 2 diabetes mellitus ([place])   Relevant Orders   Lipid panel     Musculoskeletal and Integument   Acute gout of right foot    Improving, will check uric acid.  Patient has CKD, pending uric acid evaluation anticipate starting allopurinol.  We will also need safe option for as needed treatment.      Relevant Orders   Uric Acid   Other Visit Diagnoses     Annual physical exam    -  Primary       Screening: -- Blood pressure screen normal -- cholesterol screening: will obtain -- Weight screening: obese: discussed management options, including lifestyle, dietary, and exercise. -- Diabetes Screening: will obtain -- Nutrition: normal - Encouraged healthy diet  The 10-year ASCVD risk score (Arnett DK, et al., 2019) is: 24.4%   Values used to calculate the score:     Age: 8 years     Sex: Male     Is  Non-Hispanic African American: No     Diabetic: Yes     Tobacco smoker: No     Systolic Blood Pressure: 90 mmHg     Is BP treated: Yes     HDL Cholesterol: 34.9 mg/dL     Total Cholesterol: 146 mg/dL  -- ASA 81 mg discussed if CVD risk >10% age 50-59 and willing to take for 10  years -- Statin therapy for Age 49-75 with CVD risk >7.5%  Psych -- Depression screening (PHQ-9): negative  Safety -- tobacco screening: not using -- alcohol screening:  low-risk usage. -- no evidence of domestic violence or intimate partner violence.  Cancer Screening -- Prostate (age 38-69) declined -- Colon (age 18-75)  up to date -- Lung not indicated   Immunizations Immunization History  Administered Date(s) Administered   PFIZER(Purple Top)SARS-COV-2 Vaccination 11/17/2019, 12/08/2019   Pneumococcal Conjugate-13 01/24/2015   Pneumococcal Polysaccharide-23 09/18/2011, 11/12/2016   Tdap 06/26/2010    -- flu vaccine not up to date - encouraged getting -- TDAP q10 years not up to date - encouraged getting -- Shingles (age >43) not up to date - encouraged getting -- PPSV-23 (19-64 with chronic disease or smoking) up to date -- PCV-13 (age >43) - one dose followed by PPSV-23 1 year later up to date -- Covid-19 Vaccine up to date  Encouraged regular vision and dental screening. Encouraged healthy exercise and diet.   Lesleigh Noe

## 2022-05-15 NOTE — Assessment & Plan Note (Signed)
Improving, will check uric acid.  Patient has CKD, pending uric acid evaluation anticipate starting allopurinol.  We will also need safe option for as needed treatment.

## 2022-05-16 ENCOUNTER — Other Ambulatory Visit: Payer: Self-pay | Admitting: Family Medicine

## 2022-05-16 DIAGNOSIS — M1A09X0 Idiopathic chronic gout, multiple sites, without tophus (tophi): Secondary | ICD-10-CM

## 2022-05-16 MED ORDER — ALLOPURINOL 100 MG PO TABS: 100.0000 mg | ORAL_TABLET | Freq: Every day | ORAL | 0 refills | Status: DC

## 2022-05-23 DIAGNOSIS — J449 Chronic obstructive pulmonary disease, unspecified: Secondary | ICD-10-CM | POA: Diagnosis not present

## 2022-05-23 DIAGNOSIS — R062 Wheezing: Secondary | ICD-10-CM | POA: Diagnosis not present

## 2022-05-30 ENCOUNTER — Other Ambulatory Visit: Payer: Self-pay | Admitting: Family Medicine

## 2022-05-30 ENCOUNTER — Other Ambulatory Visit (INDEPENDENT_AMBULATORY_CARE_PROVIDER_SITE_OTHER): Payer: Medicare HMO

## 2022-05-30 DIAGNOSIS — M1A09X0 Idiopathic chronic gout, multiple sites, without tophus (tophi): Secondary | ICD-10-CM | POA: Diagnosis not present

## 2022-05-30 DIAGNOSIS — L210 Seborrhea capitis: Secondary | ICD-10-CM

## 2022-05-30 LAB — URIC ACID: Uric Acid, Serum: 8.5 mg/dL — ABNORMAL HIGH (ref 4.0–7.8)

## 2022-05-31 ENCOUNTER — Other Ambulatory Visit: Payer: Self-pay | Admitting: Family Medicine

## 2022-05-31 DIAGNOSIS — M1A09X0 Idiopathic chronic gout, multiple sites, without tophus (tophi): Secondary | ICD-10-CM

## 2022-05-31 MED ORDER — ALLOPURINOL 200 MG PO TABS: 200.0000 mg | ORAL_TABLET | Freq: Every day | ORAL | 0 refills | Status: DC

## 2022-06-03 ENCOUNTER — Telehealth: Payer: Self-pay

## 2022-06-03 DIAGNOSIS — M1A09X0 Idiopathic chronic gout, multiple sites, without tophus (tophi): Secondary | ICD-10-CM

## 2022-06-03 NOTE — Telephone Encounter (Signed)
Would 100 mg take 2 be approved?

## 2022-06-03 NOTE — Telephone Encounter (Signed)
I started prior auth for Allopurinol '200MG'$  tablets. [place]an [place]: [id number] - PA Case ID: [id number] - Rx #: [id number] Per Cover My Meds, the formulary alternative is allopurinol tablet ('100mg'$  or '300mg'$ ).  Could patient be switched to one of these doses for this to get approved?  Or other formulary alternatives are: probenecid, colchicine, and Mitigare.

## 2022-06-04 MED ORDER — ALLOPURINOL 100 MG PO TABS: 200.0000 mg | ORAL_TABLET | Freq: Every day | ORAL | 0 refills | Status: DC

## 2022-06-04 NOTE — Telephone Encounter (Signed)
I called [employer], Part D (Medication), spoke to [place].  She stated that Allopurinol 100 mg 60 tablets/30 day supply (2 daily) is fully covered at an estimated cost of $0, no PA needed.

## 2022-06-04 NOTE — Addendum Note (Signed)
Addended by: Lesleigh Noe on: 06/04/2022 09:58 AM   Modules accepted: Orders

## 2022-06-04 NOTE — Telephone Encounter (Signed)
Updated prescription sent to pharmacy.

## 2022-06-04 NOTE — Telephone Encounter (Signed)
I called [facility] to verify with them that they have prescription for Allopurinol 100 mg twice a day ready and it does not need prior authorization.  The prescription is ready for pickup.  Patient notified via mychart.

## 2022-06-07 DIAGNOSIS — L309 Dermatitis, unspecified: Secondary | ICD-10-CM | POA: Diagnosis not present

## 2022-06-07 DIAGNOSIS — L858 Other specified epidermal thickening: Secondary | ICD-10-CM | POA: Diagnosis not present

## 2022-06-07 DIAGNOSIS — L918 Other hypertrophic disorders of the skin: Secondary | ICD-10-CM | POA: Diagnosis not present

## 2022-06-07 DIAGNOSIS — L820 Inflamed seborrheic keratosis: Secondary | ICD-10-CM | POA: Diagnosis not present

## 2022-06-07 DIAGNOSIS — D225 Melanocytic nevi of trunk: Secondary | ICD-10-CM | POA: Diagnosis not present

## 2022-06-07 DIAGNOSIS — L218 Other seborrheic dermatitis: Secondary | ICD-10-CM | POA: Diagnosis not present

## 2022-06-07 DIAGNOSIS — L821 Other seborrheic keratosis: Secondary | ICD-10-CM | POA: Diagnosis not present

## 2022-06-07 DIAGNOSIS — B078 Other viral warts: Secondary | ICD-10-CM | POA: Diagnosis not present

## 2022-06-22 DIAGNOSIS — R062 Wheezing: Secondary | ICD-10-CM | POA: Diagnosis not present

## 2022-06-22 DIAGNOSIS — J449 Chronic obstructive pulmonary disease, unspecified: Secondary | ICD-10-CM | POA: Diagnosis not present

## 2022-06-24 ENCOUNTER — Other Ambulatory Visit (INDEPENDENT_AMBULATORY_CARE_PROVIDER_SITE_OTHER): Payer: Medicare HMO

## 2022-06-24 DIAGNOSIS — M1A09X0 Idiopathic chronic gout, multiple sites, without tophus (tophi): Secondary | ICD-10-CM

## 2022-06-24 LAB — URIC ACID: Uric Acid, Serum: 6.6 mg/dL (ref 4.0–7.8)

## 2022-06-29 DIAGNOSIS — Z743 Need for continuous supervision: Secondary | ICD-10-CM | POA: Diagnosis not present

## 2022-06-29 DIAGNOSIS — R6889 Other general symptoms and signs: Secondary | ICD-10-CM | POA: Diagnosis not present

## 2022-07-23 DIAGNOSIS — J449 Chronic obstructive pulmonary disease, unspecified: Secondary | ICD-10-CM | POA: Diagnosis not present

## 2022-07-23 DIAGNOSIS — R062 Wheezing: Secondary | ICD-10-CM | POA: Diagnosis not present

## 2022-08-06 ENCOUNTER — Other Ambulatory Visit: Payer: Self-pay

## 2022-08-06 DIAGNOSIS — I5032 Chronic diastolic (congestive) heart failure: Secondary | ICD-10-CM

## 2022-08-06 DIAGNOSIS — E1122 Type 2 diabetes mellitus with diabetic chronic kidney disease: Secondary | ICD-10-CM

## 2022-08-06 DIAGNOSIS — I10 Essential (primary) hypertension: Secondary | ICD-10-CM

## 2022-08-06 MED ORDER — METOPROLOL SUCCINATE ER 25 MG PO TB24: 25.0000 mg | ORAL_TABLET | Freq: Every day | ORAL | 0 refills | Status: DC

## 2022-08-06 MED ORDER — GLIPIZIDE 5 MG PO TABS: 5.0000 mg | ORAL_TABLET | Freq: Two times a day (BID) | ORAL | 0 refills | Status: DC

## 2022-08-06 MED ORDER — IRBESARTAN 300 MG PO TABS: 300.0000 mg | ORAL_TABLET | Freq: Every day | ORAL | 0 refills | Status: DC

## 2022-08-06 MED ORDER — FUROSEMIDE 20 MG PO TABS: 40.0000 mg | ORAL_TABLET | Freq: Every day | ORAL | 0 refills | Status: DC

## 2022-08-06 MED ORDER — ALLOPURINOL 100 MG PO TABS: 200.0000 mg | ORAL_TABLET | Freq: Every day | ORAL | 0 refills | Status: DC

## 2022-08-06 NOTE — Telephone Encounter (Signed)
Refill request came in from [facility]. Has TOC appointment with Eugenia Pancoast on 10/10/2022

## 2022-08-06 NOTE — Telephone Encounter (Signed)
Please advise pt needs appt within the next thirty days as missed TOC scheduled with me and rescheduled way out in [place]. Needs to be seen sooner , sent in thirty day supply med refills.

## 2022-08-13 ENCOUNTER — Other Ambulatory Visit: Payer: Self-pay

## 2022-08-13 DIAGNOSIS — L210 Seborrhea capitis: Secondary | ICD-10-CM

## 2022-08-13 DIAGNOSIS — E7800 Pure hypercholesterolemia, unspecified: Secondary | ICD-10-CM

## 2022-08-13 MED ORDER — KETOCONAZOLE 2 % EX SHAM: MEDICATED_SHAMPOO | CUTANEOUS | 0 refills | Status: DC

## 2022-08-13 MED ORDER — PRAVASTATIN SODIUM 20 MG PO TABS: 20.0000 mg | ORAL_TABLET | Freq: Every day | ORAL | 0 refills | Status: DC

## 2022-08-13 NOTE — Telephone Encounter (Signed)
Refill request from pharmacy. TOC appointment with Hoy Morn on 10/10/22

## 2022-08-15 ENCOUNTER — Encounter: Payer: Medicare HMO | Admitting: Family

## 2022-08-20 DIAGNOSIS — E119 Type 2 diabetes mellitus without complications: Secondary | ICD-10-CM | POA: Diagnosis not present

## 2022-08-20 DIAGNOSIS — H524 Presbyopia: Secondary | ICD-10-CM | POA: Diagnosis not present

## 2022-08-20 DIAGNOSIS — H52223 Regular astigmatism, bilateral: Secondary | ICD-10-CM | POA: Diagnosis not present

## 2022-08-20 DIAGNOSIS — Z961 Presence of intraocular lens: Secondary | ICD-10-CM | POA: Diagnosis not present

## 2022-08-22 DIAGNOSIS — J449 Chronic obstructive pulmonary disease, unspecified: Secondary | ICD-10-CM | POA: Diagnosis not present

## 2022-08-22 DIAGNOSIS — R062 Wheezing: Secondary | ICD-10-CM | POA: Diagnosis not present

## 2022-09-03 ENCOUNTER — Other Ambulatory Visit: Payer: Self-pay | Admitting: Family

## 2022-09-03 DIAGNOSIS — I5032 Chronic diastolic (congestive) heart failure: Secondary | ICD-10-CM

## 2022-09-03 DIAGNOSIS — I10 Essential (primary) hypertension: Secondary | ICD-10-CM

## 2022-09-03 DIAGNOSIS — E1122 Type 2 diabetes mellitus with diabetic chronic kidney disease: Secondary | ICD-10-CM

## 2022-09-11 ENCOUNTER — Other Ambulatory Visit: Payer: Self-pay | Admitting: Family

## 2022-09-11 DIAGNOSIS — L210 Seborrhea capitis: Secondary | ICD-10-CM

## 2022-10-10 ENCOUNTER — Other Ambulatory Visit: Payer: Self-pay | Admitting: Nurse Practitioner

## 2022-10-10 ENCOUNTER — Encounter: Payer: Self-pay | Admitting: Family

## 2022-10-10 ENCOUNTER — Ambulatory Visit (INDEPENDENT_AMBULATORY_CARE_PROVIDER_SITE_OTHER): Payer: Medicare Other | Admitting: Family

## 2022-10-10 VITALS — BP 130/78 | HR 62 | Temp 98.2°F | Ht 68.5 in | Wt 326.0 lb

## 2022-10-10 DIAGNOSIS — I83019 Varicose veins of right lower extremity with ulcer of unspecified site: Secondary | ICD-10-CM | POA: Insufficient documentation

## 2022-10-10 DIAGNOSIS — N182 Chronic kidney disease, stage 2 (mild): Secondary | ICD-10-CM

## 2022-10-10 DIAGNOSIS — Z860100 Personal history of colon polyps, unspecified: Secondary | ICD-10-CM | POA: Insufficient documentation

## 2022-10-10 DIAGNOSIS — I5032 Chronic diastolic (congestive) heart failure: Secondary | ICD-10-CM

## 2022-10-10 DIAGNOSIS — J9611 Chronic respiratory failure with hypoxia: Secondary | ICD-10-CM

## 2022-10-10 DIAGNOSIS — I872 Venous insufficiency (chronic) (peripheral): Secondary | ICD-10-CM | POA: Diagnosis not present

## 2022-10-10 DIAGNOSIS — E1122 Type 2 diabetes mellitus with diabetic chronic kidney disease: Secondary | ICD-10-CM

## 2022-10-10 DIAGNOSIS — N401 Enlarged prostate with lower urinary tract symptoms: Secondary | ICD-10-CM

## 2022-10-10 DIAGNOSIS — N138 Other obstructive and reflux uropathy: Secondary | ICD-10-CM | POA: Insufficient documentation

## 2022-10-10 DIAGNOSIS — L409 Psoriasis, unspecified: Secondary | ICD-10-CM | POA: Insufficient documentation

## 2022-10-10 DIAGNOSIS — I83029 Varicose veins of left lower extremity with ulcer of unspecified site: Secondary | ICD-10-CM

## 2022-10-10 DIAGNOSIS — I83892 Varicose veins of left lower extremities with other complications: Secondary | ICD-10-CM

## 2022-10-10 DIAGNOSIS — Z8739 Personal history of other diseases of the musculoskeletal system and connective tissue: Secondary | ICD-10-CM

## 2022-10-10 DIAGNOSIS — Z6841 Body Mass Index (BMI) 40.0 and over, adult: Secondary | ICD-10-CM

## 2022-10-10 DIAGNOSIS — J9612 Chronic respiratory failure with hypercapnia: Secondary | ICD-10-CM

## 2022-10-10 DIAGNOSIS — I739 Peripheral vascular disease, unspecified: Secondary | ICD-10-CM

## 2022-10-10 DIAGNOSIS — E66813 Obesity, class 3: Secondary | ICD-10-CM | POA: Insufficient documentation

## 2022-10-10 DIAGNOSIS — L210 Seborrhea capitis: Secondary | ICD-10-CM

## 2022-10-10 DIAGNOSIS — R600 Localized edema: Secondary | ICD-10-CM | POA: Insufficient documentation

## 2022-10-10 DIAGNOSIS — L97919 Non-pressure chronic ulcer of unspecified part of right lower leg with unspecified severity: Secondary | ICD-10-CM

## 2022-10-10 DIAGNOSIS — L97929 Non-pressure chronic ulcer of unspecified part of left lower leg with unspecified severity: Secondary | ICD-10-CM

## 2022-10-10 LAB — POCT GLYCOSYLATED HEMOGLOBIN (HGB A1C): Hemoglobin A1C: 6.7 % — AB (ref 4.0–5.6)

## 2022-10-10 MED ORDER — CLOBETASOL PROPIONATE 0.05 % EX CREA: 1.0000 | TOPICAL_CREAM | Freq: Two times a day (BID) | CUTANEOUS | 0 refills | Status: DC

## 2022-10-10 NOTE — Progress Notes (Signed)
Reviewed with pt in office.

## 2022-10-10 NOTE — Assessment & Plan Note (Signed)
Continue flomax 0.4 mg

## 2022-10-10 NOTE — Progress Notes (Signed)
New Patient Office Visit  Subjective:  Patient ID: Alfred Carney, male    DOB: [birth year]  Age: 72 y.o. MRN: [id number]  CC:  Chief Complaint  Patient presents with   Establish Care    TOC from Dr Einar Pheasant.    HPI Alfred Carney is here for a transition of care visit. Oriented to practice routines and expectations.  Prior provider was: Dr. Waunita Schooner   Pt is without acute concerns.   chronic concerns:  DM2:  Lab Results  Component Value Date   HGBA1C 6.7 (A) 10/10/2022   Last eye exam within the last year.   Hyperlipidemia: taking daily pravastatin 20 mg nightly. Denies myalgias.  Lab Results  Component Value Date   CHOL 131 05/15/2022   HDL 35.90 (L) 05/15/2022   LDLCALC 77 05/15/2022   TRIG 87.0 05/15/2022   CHOLHDL 4 05/15/2022   Pedal edema: prescribed lasix 40 mg once daily however only taking 20 mg once daily because he doesn't like how much it makes him pee.   Continuous oxygen with COPD/emphysema, with hypoxia: on 2L continuous . Sees pulmonary. Used to smoke, no longer smokes.   BPH with urinary bladder symptoms, with urinary leakage. On flomax 0.4 mg once daily. Not hard to start a stream of urine, hard to stop.   PAD: with wound on right lower leg, seen by dermatology, has been ongoing. Does not see vascular.   Gout:  Allopurinol, only takes allopurinol when he feels the gout coming on otherwise doesn't take daily because he notices it aggravates him and gives him palpitations.  Lab Results  Component Value Date   LABURIC 6.6 06/24/2022     ROS: Negative unless specifically indicated above in HPI.   Current Outpatient Medications:    albuterol (PROVENTIL HFA;VENTOLIN HFA) 108 (90 Base) MCG/ACT inhaler, Inhale 2 puffs into the lungs every 6 (six) hours as needed for wheezing or shortness of breath., Disp: 1 Inhaler, Rfl: 6   allopurinol (ZYLOPRIM) 100 MG tablet, Take 2 tablets (200 mg total) by mouth daily., Disp: 60 tablet, Rfl: 0   Calcium  Carbonate-Simethicone 750-80 MG CHEW, Chew 1 tablet by mouth as needed (gas). , Disp: , Rfl:    Cholecalciferol (VITAMIN D PO), Take 1,000 Units by mouth daily., Disp: , Rfl:    clobetasol cream (TEMOVATE) 2.87 %, Apply 1 Application topically 2 (two) times daily., Disp: 30 g, Rfl: 0   furosemide (LASIX) 20 MG tablet, Take 2 tablets by mouth once daily, Disp: 60 tablet, Rfl: 0   glipiZIDE (GLUCOTROL) 5 MG tablet, TAKE 1 TABLET BY MOUTH TWICE DAILY BEFORE A MEAL, Disp: 60 tablet, Rfl: 0   ibuprofen (ADVIL) 200 MG tablet, Take 200 mg by mouth every 6 (six) hours as needed., Disp: , Rfl:    irbesartan (AVAPRO) 300 MG tablet, Take 1 tablet by mouth once daily, Disp: 30 tablet, Rfl: 0   metoprolol succinate (TOPROL-XL) 25 MG 24 hr tablet, Take 1 tablet by mouth once daily, Disp: 30 tablet, Rfl: 0   nitroGLYCERIN (NITROSTAT) 0.4 MG SL tablet, Place 1 tablet (0.4 mg total) under the tongue every 5 (five) minutes x 3 doses as needed for chest pain., Disp: 25 tablet, Rfl: 0   OXYGEN, Inhale 2 L into the lungs continuous. , Disp: , Rfl:    pravastatin (PRAVACHOL) 20 MG tablet, Take 1 tablet (20 mg total) by mouth daily., Disp: 90 tablet, Rfl: 0   tacrolimus (PROTOPIC) 0.1 % ointment, Apply topically 2 (two) times  daily. 2 weeks, Disp: , Rfl:    tamsulosin (FLOMAX) 0.4 MG CAPS capsule, Take 1 capsule (0.4 mg total) by mouth at bedtime., Disp: 90 capsule, Rfl: 3 Past Medical History:  Diagnosis Date   Chronic obstructive pulmonary disease ([place]) 2008   Moderate   Colon polyps    Community acquired pneumonia    Coronary artery disease    Degenerative joint disease    Diabetes mellitus without complication ([place])    type 2   Emphysema lung ([place])    Enlarged liver    fatty liver by '09 CT   Gastroesophageal reflux disease    Gout    H/O hiatal hernia    History of [place] spotted fever    Possible history of [place] Spotted Fever   Hyperlipidemia    Hypertension    Hypokalemia     diuretic induced, resolved   Microcytic anemia    iron pills   Morbid obesity ([place])    Shortness of breath    Skin cancer    skin cancer lip removed   Sleep apnea    not currently using CPAP 05/20/13   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 LEVEL  10/23/2018       ESOPHAGOGASTRODUODENOSCOPY (EGD) WITH PROPOFOL N/A 01/22/2018   Procedure: ESOPHAGOGASTRODUODENOSCOPY (EGD) WITH PROPOFOL;  Surgeon: Mauri Pole, MD;  Location: [facility];  Service: Endoscopy;  Laterality: N/A;   HARDWARE REMOVAL Left 05/26/2013   Procedure: HARDWARE REMOVAL;  Surgeon: Newt Minion, MD;  Location: [place];  Service: Orthopedics;  Laterality: Left;  Left Total Hip Arthroplasty, Removal of Deep Hardware   HIP SURGERY     Status post left hip surgery with bone grafting   IR INJECT/THERA/INC NEEDLE/CATH/PLC EPI/LUMB/SAC W/IMG  10/23/2018   LEFT HEART CATHETERIZATION WITH CORONARY ANGIOGRAM N/A 01/31/2014   Procedure: LEFT HEART CATHETERIZATION WITH CORONARY ANGIOGRAM;  Surgeon: Burnell Blanks, MD;  [facility];  Service: Cardiovascular;  Laterality: N/A;   LUMBAR LAMINECTOMY/DECOMPRESSION MICRODISCECTOMY Left 10/28/2018   Procedure: Left Lumbar three-four Extraforaminal microdiscectomy;  Surgeon: Jovita Gamma, MD;  Location: [place];  Service: Neurosurgery;  Laterality: Left;   THORACOABDOMINAL AORTIC ANEURYSM REPAIR     with right femoral and left iliac BPG and reimplantation of renal arteries.   TONSILLECTOMY     TONSILLECTOMY     TOTAL HIP ARTHROPLASTY Left 05/26/2013   Procedure: TOTAL HIP  ARTHROPLASTY;  Surgeon: Newt Minion, MD;  Location: [place];  Service: Orthopedics;  Laterality: Left;  Left Total Hip Arthroplasty, Removal Deep Hardware    Objective:   Today's Vitals: BP 130/78   Pulse 62   Temp 98.2 F (36.8 C) (Oral)   Ht 5' 8.5" (1.74 m)   Wt (!) 326 lb (147.9 kg)   SpO2 98%   BMI 48.85 kg/m   Physical Exam Vitals reviewed.  Constitutional:      General: He is not in acute distress.    Appearance: Normal appearance. He is obese. He is not ill-appearing, toxic-appearing or diaphoretic.  Cardiovascular:     Rate and  Rhythm: Normal rate and regular rhythm.     Pulses:          Dorsalis pedis pulses are 1+ on the right side and 1+ on the left side.       Posterior tibial pulses are 1+ on the right side and 1+ on the left side.     Heart sounds: Normal heart sounds.  Pulmonary:     Effort: Pulmonary effort is normal. No accessory muscle usage or respiratory distress.     Breath sounds: Decreased air movement and transmitted upper airway sounds present.  Musculoskeletal:        General: Normal range of motion.     Right lower leg: 3+ Edema present.     Left lower leg: 3+ Edema present.  Skin:    Findings: Rash (bil anterior lower shins with ulceration/yellow dried discharge on raised bed of erythema) present.     Comments: Also rash on right anterior elbow with base of erythema and silver scaling  Neurological:     General: No focal deficit present.     Mental Status: He is alert and oriented to person, place, and time. Mental status is at baseline.  Psychiatric:        Mood and Affect: Mood normal.        Behavior: Behavior normal.        Thought Content: Thought content normal.        Judgment: Judgment normal.           Assessment & Plan:  History of colon polyps  History of gout  Venous stasis dermatitis of both lower extremities Assessment & Plan: With wound at this time,  Referral placed for vascular Recent dermatology biopsy  negative  Continue furosemide to minimize pedal edema  Orders: -     Ambulatory referral to Vascular Surgery  PAD (peripheral artery disease) ([place]) -     Ambulatory referral to Vascular Surgery  Venous ulcer of right leg ([place]) -     Ambulatory referral to Vascular Surgery  BPH with obstruction/lower urinary tract symptoms Assessment & Plan: Continue flomax 0.4 mg    Class 3 severe obesity due to excess calories with serious comorbidity and body mass index (BMI) of 45.0 to 49.9 in adult [facility]) Assessment & Plan: Pt advised to work on diet and exercise as tolerated    Venous stasis ulcer of left lower leg with edema of left lower leg ([place]) Assessment & Plan: F/u with vascular as ongoing, chronic. Dermatology biopsy negative.  Continue with furosemide to decrease edema as able.   Orders: -     Ambulatory referral to Vascular Surgery  Type 2 diabetes mellitus with stage 2 chronic kidney disease, without long-term current use of insulin (HCC) Assessment & Plan: Controlled Continue current medication as prescribed. Ordered hga1c today, stable. Pt advised to:  Work on diabetic diet and exercise as tolerated. Yearly foot exam, and annual eye exam.    Orders: -     POCT glycosylated hemoglobin (Hb A1C) -     Microalbumin / creatinine urine ratio; Future  Psoriasis Assessment & Plan: Clobetasol cream sent to pharmacy for trial run on right elbow   Orders: -     Clobetasol Propionate; Apply 1 Application topically 2 (two) times daily.  Dispense: 30 g; Refill: 0  Chronic diastolic heart failure ([place]) Assessment & Plan: Did recommend that pt take lasix as prescribed at 40 mg daily due to access fluid on his bil LE  Pt will  consider   Chronic respiratory failure with hypoxia and hypercapnia (HCC) Assessment & Plan: Advised pt to fo f/u with pulmonary as last seen two years ago, and on continuous oxygen. Pt agreeable to call and schedule f/u.  Total time in clinic with  patient going over medical history, various comorbidites, poc lab testing, reviewing of previous consult notes and speaking with patient about chronic concerns totaled 46 minutes.      Follow-up: Return in about 6 months (around 04/10/2023) for f/u CPE after 05/16/23.   Eugenia Pancoast, FNP

## 2022-10-10 NOTE — Assessment & Plan Note (Signed)
F/u with vascular as ongoing, chronic. Dermatology biopsy negative.  Continue with furosemide to decrease edema as able.

## 2022-10-10 NOTE — Assessment & Plan Note (Signed)
Pt advised to work on diet and exercise as tolerated  

## 2022-10-10 NOTE — Patient Instructions (Addendum)
  Call and make f/u with Dr. Melvyn Novas pulmonary for a f/u. Should be once yearly?   A referral was placed today for vascular surgery.  Please let us know if you have not heard back within 2 weeks about the referral.  Welcome to our clinic, I am happy to have you as my new patient. I am excited to continue on this healthcare journey with you.  ------------------------------------   Please keep in mind Any my chart messages you send have up to a three business day turnaround for a response.  Phone calls may take up to a one full business day turnaround for a  response.   If you need a medication refill I recommend you request it through the pharmacy as this is easiest for [name] rather than sending a message and or phone call.   Due to recent changes in healthcare laws, you may see results of your imaging and/or laboratory studies on MyChart before I have had a chance to review them.  I understand that in some cases there may be results that are confusing or concerning to you. Please understand that not all results are received at the same time and often I may need to interpret multiple results in order to provide you with the best plan of care or course of treatment. Therefore, I ask that you please give me 2 business days to thoroughly review all your results before contacting my office for clarification. Should we see a critical lab result, you will be contacted sooner.   It was a pleasure seeing you today! Please do not hesitate to reach out with any questions and or concerns.  Regards,   Eugenia Pancoast FNP-C

## 2022-10-10 NOTE — Assessment & Plan Note (Signed)
With wound at this time,  Referral placed for vascular Recent dermatology biopsy negative  Continue furosemide to minimize pedal edema

## 2022-10-11 ENCOUNTER — Other Ambulatory Visit: Payer: Self-pay

## 2022-10-11 DIAGNOSIS — L210 Seborrhea capitis: Secondary | ICD-10-CM

## 2022-10-11 NOTE — Assessment & Plan Note (Signed)
Clobetasol cream sent to pharmacy for trial run on right elbow

## 2022-10-11 NOTE — Assessment & Plan Note (Addendum)
Advised pt to fo f/u with pulmonary as last seen two years ago, and on continuous oxygen. Pt agreeable to call and schedule f/u.  Total time in clinic with patient going over medical history, various comorbidites, poc lab testing, reviewing of previous consult notes and speaking with patient about chronic concerns totaled 46 minutes.

## 2022-10-11 NOTE — Assessment & Plan Note (Signed)
Did recommend that pt take lasix as prescribed at 40 mg daily due to access fluid on his bil LE  Pt will consider

## 2022-10-11 NOTE — Assessment & Plan Note (Signed)
Controlled Continue current medication as prescribed. Ordered hga1c today, stable. Pt advised to:  Work on diabetic diet and exercise as tolerated. Yearly foot exam, and annual eye exam.

## 2022-10-11 NOTE — Telephone Encounter (Signed)
metoprolol succinate (TOPROL-XL) 25 MG 24 hr tablet and Ketoconazole 2 % External shampoo refill request. Last OV- 10/10/22 Last refill- 09/03/22 Next OV- 05/12/23 Ok to fill as pended?

## 2022-10-18 ENCOUNTER — Other Ambulatory Visit: Payer: Self-pay | Admitting: Nurse Practitioner

## 2022-10-18 DIAGNOSIS — I10 Essential (primary) hypertension: Secondary | ICD-10-CM

## 2022-10-21 ENCOUNTER — Telehealth: Payer: Self-pay | Admitting: Family

## 2022-10-21 NOTE — Telephone Encounter (Signed)
Patient would like to know if 90 day rx can be sen in for medicationrbesartan (AVAPRO) 300 MG tablet  ,he is now prescribed 30 day,and he stated that it has been a lot for them to go to the pharmacy every month to get it.  [place], [address] Phone: [phone]  Fax: [phone]

## 2022-10-22 NOTE — Telephone Encounter (Signed)
Refilled

## 2022-10-24 ENCOUNTER — Other Ambulatory Visit: Payer: Self-pay | Admitting: *Deleted

## 2022-10-24 DIAGNOSIS — I739 Peripheral vascular disease, unspecified: Secondary | ICD-10-CM

## 2022-11-04 NOTE — Progress Notes (Unsigned)
Subjective:   Patient ID: Alfred Carney, male    DOB: April 06, 1951    MRN: [id number]   Brief patient profile:  71 yowm quit smoking 2008 with baseline weight of 240 when he stopped smoking in March of 2008 p pna with doe only some better then  worse since summer 2015 on so referred 05/17/2015 to pulmonary clinic by Dr Helane Rima for copd eval as had been seen here in 2012 with GOLD II criteria.     History of Present Illness 05/17/2015 1st [facility] office visit/ Alfred Carney  / copd eval on ACEi  Chief Complaint  Patient presents with   Pulmonary Consult    Referre by Dr. Helane Rima. Pt c/o SOB x 10 yrs, gradually worse over the past yr.  He states that he gets SOB with minimal exertion such as taking a shower or walking 49 yrds on flat surface.   indolent onset progressive doe but always ok at rest/  Sleeps in recliner x 10 years Has osa/ could not tol cpap and tends to fall asleep p eats [employer] with sense of nasal and throat congestion but very little cough - does have hb but controls with prn ppi Had been on several different inhalers including according to the records Spiriva but didn't think any of them really helped and stopped them years ago rec Stop lisinopril  Valsartan 80 mg one daily in place of lisinopril Prilosec Take 30- 60 min before your first and last meals of the day just until you return  GERD  Diet     06/28/2015  f/u ov/Alfred Carney re: GOLD II copd/ obesity Chief Complaint  Patient presents with   Follow-up    PFT done today. Pt states his breathing is unchanged. He c/o cough for the past month- prod with min clear sputum.    Was some better until caught cold x 2 weeks p serving jury duty Has not tried sleeping s recliner  rec Stop hydrodiuril (thiazides contribute to gout)  Start lasix 20 mg daily as needed for swelling  - if you cut the sal out you may not need this  Symbicort 160 Take 2 puffs first thing in am and then another 2 puffs about 12 hours later.       11/05/2022  f/u ov/Alfred Carney re: GOLD 2 but 02 dep   maint on 2lpm [id number]  complicated by MO Chief Complaint  Patient presents with   Follow-up    Follow up.  Last ov 08/11/2020.  Doing well.  Oxygen 2 lpm continuous (tank) 24/7  Dyspnea:  walking to car and back to house  Cough: none  Sleeping: recliner 30 degrees  SABA use: none  02: 2lpm hs and daytime/ does not titrate Covid status:   vax x 2  no infectiong     No obvious day to day or daytime variability or assoc excess/ purulent sputum or mucus plugs or hemoptysis or cp or chest tightness, subjective wheeze or overt sinus or hb symptoms.   Sleeping as above  without nocturnal  or early am exacerbation  of respiratory  c/o's or need for noct saba. Also denies any obvious fluctuation of symptoms with weather or environmental changes or other aggravating or alleviating factors except as outlined above   No unusual exposure hx or h/o childhood pna/ asthma or knowledge of premature birth.  Current Allergies, Complete Past Medical History, Past Surgical History, Family History, and Social History were reviewed in [employer] record.  ROS  The following are not active complaints unless bolded Hoarseness, sore throat, dysphagia, dental problems, itching, sneezing,  nasal congestion or discharge of excess mucus or purulent secretions, ear ache,   fever, chills, sweats, unintended wt loss or wt gain, classically pleuritic or exertional cp,  orthopnea pnd or arm/hand swelling  or leg swelling, presyncope, palpitations, abdominal pain, anorexia, nausea, vomiting, diarrhea  or change in bowel habits or change in bladder habits, change in stools or change in urine, dysuria, hematuria,  rash, arthralgias, visual complaints, headache, numbness, weakness or ataxia or problems with walking or coordination,  change in mood or  memory.        Current Meds  Medication Sig   albuterol (PROVENTIL HFA;VENTOLIN HFA) 108 (90 Base)  MCG/ACT inhaler Inhale 2 puffs into the lungs every 6 (six) hours as needed for wheezing or shortness of breath.   allopurinol (ZYLOPRIM) 100 MG tablet Take 2 tablets (200 mg total) by mouth daily.   Calcium Carbonate-Simethicone 750-80 MG CHEW Chew 1 tablet by mouth as needed (gas).    Cholecalciferol (VITAMIN D PO) Take 1,000 Units by mouth daily.   clobetasol cream (TEMOVATE) [id number] % Apply 1 Application topically 2 (two) times daily.   furosemide (LASIX) 20 MG tablet Take 2 tablets by mouth once daily   glipiZIDE (GLUCOTROL) 5 MG tablet TAKE 1 TABLET BY MOUTH TWICE DAILY BEFORE A MEAL   ibuprofen (ADVIL) 200 MG tablet Take 200 mg by mouth every 6 (six) hours as needed.   irbesartan (AVAPRO) 300 MG tablet Take 1 tablet by mouth once daily   ketoconazole (NIZORAL) 2 % shampoo APPLY TOPICALLY TO AFFECTED AREA ONCE DAILY AS NEEDED FOR IRRITATION   metoprolol succinate (TOPROL-XL) 25 MG 24 hr tablet Take 1 tablet by mouth once daily   nitroGLYCERIN (NITROSTAT) 0.4 MG SL tablet Place 1 tablet (0.4 mg total) under the tongue every 5 (five) minutes x 3 doses as needed for chest pain.   OXYGEN Inhale 2 L into the lungs continuous.    pravastatin (PRAVACHOL) 20 MG tablet Take 1 tablet (20 mg total) by mouth daily.   tacrolimus (PROTOPIC) 0.1 % ointment Apply topically 2 (two) times daily. 2 weeks   tamsulosin (FLOMAX) 0.4 MG CAPS capsule Take 1 capsule (0.4 mg total) by mouth at bedtime.              Objective:   Physical Exam  11/05/2022     328   08/11/2020    322  02/07/2020      329  08/03/2019   329  06/28/2015        326 > 08/15/2015  323 >  11/19/2017  326 >  01/05/2018 320 > 02/02/2018    324 >  05/06/2018  312 >  08/06/2018  317 > 11/13/2018   297  > 02/02/2019   318    05/17/15 329 lb (149.233 kg)  01/19/15 322 lb (146.058 kg)  01/18/15 319 lb (144.697 kg)   Vital signs reviewed  11/05/2022  - Note at rest 02 sats  96% on 2lpm cont    General appearance:   MO amb wm  .wearing suspenders         HEENT : Oropharynx  clear     NECK :  without  apparent JVD/ palpable Nodes/TM    LUNGS: no acc muscle use,  Min barrel  contour chest wall with bilateral  slightly decreased bs s audible wheeze and  without cough on insp or  exp maneuvers and min  Hyperresonant  to  percussion bilaterally    CV:  RRR  no s3 or murmur or increase in P2, and 2+ pitting both LE's   ABD:  quite obese soft and nontender    MS:  Nl gait/ ext warm without deformities Or obvious joint restrictions  calf tenderness, cyanosis or clubbing     SKIN: warm and dry with severe venous stasis dermatitis both LEs  NEURO:  alert, approp, nl sensorium with  no motor or cerebellar deficits apparent.         Assessment & Plan:

## 2022-11-05 ENCOUNTER — Encounter: Payer: Self-pay | Admitting: Internal Medicine

## 2022-11-05 ENCOUNTER — Ambulatory Visit: Payer: Medicare Other | Admitting: Internal Medicine

## 2022-11-05 VITALS — BP 132/70 | HR 63 | Temp 97.6°F | Ht 69.0 in | Wt 328.0 lb

## 2022-11-05 DIAGNOSIS — J9611 Chronic respiratory failure with hypoxia: Secondary | ICD-10-CM | POA: Diagnosis not present

## 2022-11-05 DIAGNOSIS — Z6841 Body Mass Index (BMI) 40.0 and over, adult: Secondary | ICD-10-CM

## 2022-11-05 DIAGNOSIS — J9612 Chronic respiratory failure with hypercapnia: Secondary | ICD-10-CM | POA: Diagnosis not present

## 2022-11-05 DIAGNOSIS — J449 Chronic obstructive pulmonary disease, unspecified: Secondary | ICD-10-CM | POA: Diagnosis not present

## 2022-11-05 MED ORDER — ALBUTEROL SULFATE HFA 108 (90 BASE) MCG/ACT IN AERS: 2.0000 | INHALATION_SPRAY | RESPIRATORY_TRACT | 2 refills | Status: AC | PRN

## 2022-11-05 NOTE — Patient Instructions (Signed)
.  Make sure you check your oxygen saturation  AT  your highest level of activity (not after you stop)   to be sure it stays over 90% and adjust  02 flow upward to maintain this level if needed but remember to turn it back to previous settings when you stop (to conserve your supply).   Only use your albuterol as a rescue medication to be used if you can't catch your breath by resting or doing a relaxed purse lip breathing pattern.  - The less you use it, the better it will work when you need it. - Ok to use up to 2 puffs  every 4 hours if you must but call for immediate appointment if use goes up over your usual need - Don't leave home without it !!  (think of it like the spare tire for your car)   Also  Ok to try albuterol 15 min before an activity (on alternating days)  that you know would usually make you short of breath and see if it makes any difference and if makes none then don't take albuterol after activity unless you can't catch your breath as this means it's the resting that helps, not the albuterol.      Please schedule a follow up visit in 6 months but call sooner if needed

## 2022-11-06 ENCOUNTER — Encounter: Payer: Self-pay | Admitting: Internal Medicine

## 2022-11-06 NOTE — Assessment & Plan Note (Signed)
11/19/2017  sats 81% at rest in a chronic stable state so rec 2lpm 24/7  - HC03  11/04/18 = 33  - 08/03/2019 POC trial could not maintain sats at > 88% on up to 5lpm POC  - [facility]03   06/22/20  = 31  -   08/11/2020   Walked 3lpm pulse   1.5  laps @ approx 210f each @ moderate pace  stopped due to end of study sob no desats on 02  -  HC03   05/15/22 = 29  - 11/05/2022   Walked on RA  x  one  lap(s) =  approx 150  ft  @ moderately slow pace, stopped due to sob/ desats to 88% up to 95% on 4lpm but declined to continue to walk due to fatigue   Advised again: Make sure you check your oxygen saturation  AT  your highest level of activity (not after you stop)   to be sure it stays over 90% and adjust  02 flow upward to maintain this level if needed but remember to turn it back to previous settings when you stop (to conserve your supply).

## 2022-11-06 NOTE — Assessment & Plan Note (Signed)
Body mass index is 48.44 kg/m.  -  trending up  Lab Results  Component Value Date   TSH 3.74 11/19/2017      Contributing to doe and risk of GERD/dvt and PE >>>   reviewed the need and the process to achieve and maintain neg calorie balance > defer f/u primary care including intermittently monitoring thyroid status     Each maintenance medication was reviewed in detail including emphasizing most importantly the difference between maintenance and prns and under what circumstances the prns are to be triggered using an action plan format where appropriate.  Total time for H and P, chart review, counseling,  directly observing portions of ambulatory 02 saturation study/ and generating customized AVS unique to this office visit / same day charting = 22 min

## 2022-11-06 NOTE — Assessment & Plan Note (Signed)
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 - 05/17/2015  Walked RA  2 laps @ 185 ft each stopped due to  Sob/ desat at nl pace to 83%   - trial off acei 05/17/2015  - PFT's  06/28/2015  FEV1 1.67 (51 % ) ratio 55  p 13 % improvement from saba with DLCO  47 % corrects to 66 % for alv volume  - rec trial of symbicort 06/28/15 > coughing / breathing worse so stopped it   - 08/15/2015  Walked RA x 3 laps @ 185 ft each stopped due to End of study, nl pace, min sob/  desat  To 88%  - sats 81% at rest and not acutely ill 11/19/2017 > placed on 2lpm (see separate a/p)  - Spirometry 11/19/2017  FEV1 1.60 (49%)  Ratio 59 with classic curvature - 11/19/2017  After extensive coaching inhaler device  effectiveness =    90% with smi > trial of stiolto   PFT's  01/05/2018  FEV1 1.66 (52 % ) ratio 60  p 6 % improvement from saba p 6 prior to study with DLCO  38 % corrects to 56  % for alv volume   - 01/05/2018  Referred to rehab [facility] trial 02/02/2018 > not better so d/c'd   Not limited by airflow obst or having aecopd so no change in rx needed

## 2022-11-07 ENCOUNTER — Encounter: Payer: Self-pay | Admitting: Vascular Surgery

## 2022-11-07 ENCOUNTER — Ambulatory Visit (HOSPITAL_COMMUNITY)
Admission: RE | Admit: 2022-11-07 | Discharge: 2022-11-07 | Disposition: A | Discharge status: Home / Self Care | Payer: Medicare Other | Source: Ambulatory Visit | Attending: Vascular Surgery | Admitting: Vascular Surgery

## 2022-11-07 ENCOUNTER — Ambulatory Visit: Payer: Medicare Other | Admitting: Vascular Surgery

## 2022-11-07 ENCOUNTER — Other Ambulatory Visit: Payer: Self-pay

## 2022-11-07 VITALS — BP 166/59 | HR 68 | Temp 97.7°F | Resp 18 | Ht 69.0 in | Wt 323.0 lb

## 2022-11-07 DIAGNOSIS — I739 Peripheral vascular disease, unspecified: Secondary | ICD-10-CM | POA: Insufficient documentation

## 2022-11-07 DIAGNOSIS — I872 Venous insufficiency (chronic) (peripheral): Secondary | ICD-10-CM

## 2022-11-07 DIAGNOSIS — N400 Enlarged prostate without lower urinary tract symptoms: Secondary | ICD-10-CM

## 2022-11-07 LAB — VAS US ABI WITH/WO TBI
Left ABI: 0.98
Right ABI: 0.58

## 2022-11-07 MED ORDER — TAMSULOSIN HCL 0.4 MG PO CAPS: 0.4000 mg | ORAL_CAPSULE | Freq: Every day | ORAL | 1 refills | Status: DC

## 2022-11-07 NOTE — Progress Notes (Signed)
ASSESSMENT & PLAN   PERIPHERAL ARTERIAL DISEASE: Based on his noninvasive studies, he does have evidence of infrainguinal arterial occlusive disease on the right.  However his toe pressures 110 mmHg which would suggest adequate perfusion for healing.  He does not have any rest pain even when he elevates his leg.  I would not favor arteriography unless he developed a nonhealing wound.  Certainly he would be at slightly increased risk for arteriography given his obesity.  His BMI is 48.  COMBINED CHRONIC VENOUS INSUFFICIENCY AND LYMPHEDEMA: Based on his exam, I think he has evidence of combined chronic venous insufficiency and lymphedema.  Currently he does not have any open wounds.  We have discussed the importance of leg elevation and the proper positioning for this.  He has a hard time tolerating compression stockings and we may see if we could get him some custom stockings maybe with Velcro to.  In the meantime he could use Ace bandages.  We discussed importance of exercise.  I encouraged him to avoid prolonged sitting and standing.  We also discussed the importance of maintaining a healthy weight as central obesity especially increases lower extremity venous pressure.  He is working on losing weight.  I will have him come back in 1 month and we will get formal venous reflux testing on the right.  Pending those results the other consideration would be a lymphedema pump if he has not had significant improvement.  HISTORY OF LEFT CAROTID STENOSIS: He had a carotid duplex scan in September 2021 which showed no evidence of carotid stenosis on the right.  However on the left side he had a 40 to 59% carotid stenosis.  He has not had any follow-up since that time.  When he comes back in 1 month for a formal venous reflux test on the right we will obtain a follow-up carotid duplex scan.  He is asymptomatic.  He is on a statin.  I have instructed him to begin taking 81 mg of aspirin daily.  REASON FOR CONSULT:     Venous stasis ulcer.  The consult is requested by Eugenia Pancoast, NP.  HPI:   Alfred Carney is a 72 y.o. male who is previously known to me.  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  He is referred now because of a venous stasis ulcer.  He is referred now with Chronis venous insufficiency and peripheral arterial disease.  He developed a rash on both legs as documented in the photographs below.  He was felt to be related to venous insufficiency.  However given his history it was felt that he likely had some underlying peripheral arterial disease 2.  I do not get any history of claudication although his activity is limited by his pulmonary status.  He has COPD and is on continuous O2.  I do not get any history of rest pain or nonhealing ulcers.  He was seen by dermatology and prescribed some medication and ointment for skin lubrication.  This has not made a big difference yet.  He is not a smoker.  Past Medical History:  Diagnosis Date   Chronic obstructive pulmonary disease ([place]) 2008   Moderate   Colon polyps    Community acquired pneumonia    Coronary artery disease    Degenerative joint disease    Diabetes mellitus without complication ([place])    type 2   Emphysema lung ([place])    Enlarged liver    fatty  liver by '09 CT   Gastroesophageal reflux disease    Gout    H/O hiatal hernia    History of Rocky Mountain spotted fever    Possible history of [place] Spotted Fever   Hyperlipidemia    Hypertension    Hypokalemia    diuretic induced, resolved   Microcytic anemia    iron pills   Morbid obesity (HCC)    Shortness of breath    Skin cancer    skin cancer lip removed   Sleep apnea    not currently using CPAP 05/20/13   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[place])    status post vascular surgery repair    Family History  Problem Relation Age of Onset   Osteoarthritis Mother    Diabetes Mother    Pulmonary embolism Mother    Heart disease  Father        Coronary Artery Disease   Stroke Father    Multiple sclerosis Sister    Factor V Leiden deficiency Sister    Emphysema Sister    Hyperlipidemia Brother    Heart attack Maternal Grandmother    Lung cancer Maternal Grandfather        smoked    SOCIAL HISTORY: Social History   Tobacco Use   Smoking status: Former    Packs/day: 1.00    Years: 35.00    Total pack years: 35.00    Types: Cigarettes    Quit date: 09/16/2006    Years since quitting: 16.1   Smokeless tobacco: Never  Substance Use Topics   Alcohol use: No    Alcohol/week: 0.0 standard drinks of alcohol    No Known Allergies  Current Outpatient Medications  Medication Sig Dispense Refill   albuterol (PROVENTIL HFA;VENTOLIN HFA) 108 (90 Base) MCG/ACT inhaler Inhale 2 puffs into the lungs every 6 (six) hours as needed for wheezing or shortness of breath. 1 Inhaler 6   albuterol (VENTOLIN HFA) 108 (90 Base) MCG/ACT inhaler Inhale 2 puffs into the lungs every 4 (four) hours as needed for wheezing or shortness of breath. 8 g 2   allopurinol (ZYLOPRIM) 100 MG tablet Take 2 tablets (200 mg total) by mouth daily. 60 tablet 0   Calcium Carbonate-Simethicone 750-80 MG CHEW Chew 1 tablet by mouth as needed (gas).      Cholecalciferol (VITAMIN D PO) Take 1,000 Units by mouth daily.     clobetasol cream (TEMOVATE) [id number] % Apply 1 Application topically 2 (two) times daily. 30 g 0   furosemide (LASIX) 20 MG tablet Take 2 tablets by mouth once daily 60 tablet 0   glipiZIDE (GLUCOTROL) 5 MG tablet TAKE 1 TABLET BY MOUTH TWICE DAILY BEFORE A MEAL 60 tablet 0   ibuprofen (ADVIL) 200 MG tablet Take 200 mg by mouth every 6 (six) hours as needed.     irbesartan (AVAPRO) 300 MG tablet Take 1 tablet by mouth once daily 90 tablet 3   ketoconazole (NIZORAL) 2 % shampoo APPLY TOPICALLY TO AFFECTED AREA ONCE DAILY AS NEEDED FOR IRRITATION 120 mL 2   metoprolol succinate (TOPROL-XL) 25 MG 24 hr tablet Take 1 tablet by mouth once daily  90 tablet 3   nitroGLYCERIN (NITROSTAT) 0.4 MG SL tablet Place 1 tablet (0.4 mg total) under the tongue every 5 (five) minutes x 3 doses as needed for chest pain. 25 tablet 0   OXYGEN Inhale 2 L into the lungs continuous.      pravastatin (PRAVACHOL) 20 MG tablet Take 1 tablet (20 mg total)  by mouth daily. 90 tablet 0   tacrolimus (PROTOPIC) 0.1 % ointment Apply topically 2 (two) times daily. 2 weeks     tamsulosin (FLOMAX) 0.4 MG CAPS capsule Take 1 capsule (0.4 mg total) by mouth at bedtime. 90 capsule 1   No current facility-administered medications for this visit.    REVIEW OF SYSTEMS:  [X]$  denotes positive finding, [ ]$  denotes negative finding Cardiac  Comments:  Chest pain or chest pressure:    Shortness of breath upon exertion: x   Short of breath when lying flat:    Irregular heart rhythm:        Vascular    Pain in calf, thigh, or hip brought on by ambulation: x   Pain in feet at night that wakes you up from your sleep:     Blood clot in your veins:    Leg swelling:  x       Pulmonary    Oxygen at home: x   Productive cough:     Wheezing:         Neurologic    Sudden weakness in arms or legs:     Sudden numbness in arms or legs:     Sudden onset of difficulty speaking or slurred speech:    Temporary loss of vision in one eye:     Problems with dizziness:         Gastrointestinal    Blood in stool:     Vomited blood:         Genitourinary    Burning when urinating:     Blood in urine:        Psychiatric    Major depression:         Hematologic    Bleeding problems:    Problems with blood clotting too easily:        Skin    Rashes or ulcers:        Constitutional    Fever or chills:    -  PHYSICAL EXAM:   Vitals:   11/07/22 1453  BP: (!) 166/59  Pulse: 68  Resp: 18  Temp: 97.7 F (36.5 C)  TempSrc: Temporal  SpO2: 90%  Weight: (!) 323 lb (146.5 kg)  Height: 5' 9"$  (1.753 m)   Body mass index is 47.7 kg/m.  GENERAL: The patient is a  well-nourished male, in no acute distress. The vital signs are documented above. CARDIAC: There is a regular rate and rhythm.  VASCULAR: I do not detect carotid bruits. He has palpable femoral pulses. I cannot palpate pedal pulses. He has significant bilateral lower extremity swelling with hyperpigmentation bilaterally as documented in the photographs below.     PULMONARY: There is good air exchange bilaterally without wheezing or rales. ABDOMEN: Soft and non-tender with normal pitched bowel sounds.  MUSCULOSKELETAL: There are no major deformities. NEUROLOGIC: No focal weakness or paresthesias are detected. SKIN: There are no ulcers or rashes noted. PSYCHIATRIC: The patient has a normal affect.  DATA:    ARTERIAL DOPPLER STUDY: I have independently interpreted his arterial Doppler study today.  On the right side he has a monophasic dorsalis pedis and posterior tibial signal.  ABI is 58%.  Toe pressures 110 mmHg.  On the left side he has a biphasic posterior tibial and dorsalis pedis signal.  ABI is 98%.  Toe pressures 148 mmHg.   Deitra Mayo [facility]

## 2022-11-09 ENCOUNTER — Other Ambulatory Visit: Payer: Self-pay

## 2022-11-09 DIAGNOSIS — I872 Venous insufficiency (chronic) (peripheral): Secondary | ICD-10-CM

## 2022-11-09 DIAGNOSIS — Z8679 Personal history of other diseases of the circulatory system: Secondary | ICD-10-CM

## 2022-11-09 NOTE — Addendum Note (Signed)
Addended by: Dorita Sciara, Chayah Mckee A on: 11/09/2022 02:25 PM   Modules accepted: Orders

## 2022-11-11 ENCOUNTER — Other Ambulatory Visit: Payer: Self-pay | Admitting: Family

## 2022-11-11 DIAGNOSIS — E7800 Pure hypercholesterolemia, unspecified: Secondary | ICD-10-CM

## 2022-11-11 NOTE — Telephone Encounter (Signed)
Pravastatin (PRAVACHOL) 20 MG tablet ;Refill request from pharmacy. LV- 10/10/22; LR- 08/13/22 (90 tabs/0 refill); NV- 05/12/23.

## 2022-12-04 ENCOUNTER — Encounter: Payer: Self-pay | Admitting: Vascular Surgery

## 2022-12-04 ENCOUNTER — Ambulatory Visit: Payer: Medicare Other | Admitting: Vascular Surgery

## 2022-12-04 ENCOUNTER — Ambulatory Visit (HOSPITAL_COMMUNITY)
Admission: RE | Admit: 2022-12-04 | Discharge: 2022-12-04 | Disposition: A | Discharge status: Home / Self Care | Payer: Medicare Other | Source: Ambulatory Visit | Attending: Vascular Surgery | Admitting: Vascular Surgery

## 2022-12-04 ENCOUNTER — Ambulatory Visit (INDEPENDENT_AMBULATORY_CARE_PROVIDER_SITE_OTHER)
Admission: RE | Admit: 2022-12-04 | Discharge: 2022-12-04 | Disposition: A | Discharge status: Home / Self Care | Payer: Medicare Other | Source: Ambulatory Visit | Attending: Vascular Surgery | Admitting: Vascular Surgery

## 2022-12-04 VITALS — BP 124/82 | HR 82 | Temp 97.7°F | Resp 20 | Ht 69.0 in | Wt 315.7 lb

## 2022-12-04 DIAGNOSIS — I872 Venous insufficiency (chronic) (peripheral): Secondary | ICD-10-CM

## 2022-12-04 DIAGNOSIS — I739 Peripheral vascular disease, unspecified: Secondary | ICD-10-CM

## 2022-12-04 DIAGNOSIS — Z8679 Personal history of other diseases of the circulatory system: Secondary | ICD-10-CM | POA: Diagnosis present

## 2022-12-04 NOTE — Progress Notes (Signed)
REASON FOR VISIT:   Follow-up of multiple vascular issues.  MEDICAL ISSUES:   ASYMPTOMATIC 40 TO 59% LEFT CAROTID STENOSIS: The stenosis on the left is stable at 40 to 59%.  He is asymptomatic.  He understands we would not consider left carotid endarterectomy unless the stenosis progressed to greater than 80% or he develop new left hemispheric symptoms.  I have ordered a follow-up carotid duplex scan in 1 year.  He can be seen on the PA schedule at that time.  I have informed him that I will be retiring.  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  LYMPHEDEMA: His venous duplex scan did not show any evidence of significant superficial or deep venous reflux on the right.  I suspect most of his swelling is secondary to lymphedema.  I have encouraged him to continue to elevate his legs, avoid prolonged sitting and standing, and exercise.  PERIPHERAL ARTERIAL DISEASE: The patient does have evidence of infrainguinal arterial occlusive disease on the right on exam.  However he is asymptomatic and I would not recommend arteriography unless he developed rest pain or a nonhealing ulcer.  He had noninvasive studies in February of this year which showed an ABI of 58% on the right.  He had a toe pressure of 110 mmHg.  On the left side he had an ABI of 98% with a toe pressure of 148 mmHg.  HPI:   Alfred Carney is a pleasant 72 y.o. male who I last saw on 11/07/2022.  I was seeing him with a venous stasis ulcer.  I felt that he had evidence of combined chronic venous insufficiency and lymphedema.  He comes back for a carotid duplex scan which was due as we have been following a moderate left carotid stenosis.  In addition I wanted to get formal venous reflux testing on the right.  Since I saw him last, he was seen by dermatologist and prescribed some medicine for his rashes on both legs  and this has helped.  He has been elevating his legs.  He does describe some mild calf claudication bilaterally.  He denies any history of rest pain.  He denies any history of stroke, TIAs, expressive or receptive aphasia, or amaurosis fugax.   Past Medical History:  Diagnosis Date   Chronic obstructive pulmonary disease ([place]) 2008   Moderate   Colon polyps    Community acquired pneumonia    Coronary artery disease    Degenerative joint disease    Diabetes mellitus without complication ([place])    type 2   Emphysema lung ([place])    Enlarged liver    fatty liver by '09 CT   Gastroesophageal reflux disease    Gout    H/O hiatal hernia    History of Rocky Mountain spotted fever    Possible history of Rocky [place] Spotted Fever   Hyperlipidemia    Hypertension    Hypokalemia    diuretic induced, resolved   Microcytic anemia    iron pills   Morbid obesity (HCC)    Shortness of breath    Skin cancer    skin cancer lip removed   Sleep apnea    not currently using CPAP 05/20/13   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[place])    status post vascular surgery repair    Family History  Problem Relation Age of Onset   Osteoarthritis Mother  Diabetes Mother    Pulmonary embolism Mother    Heart disease Father        Coronary Artery Disease   Stroke Father    Multiple sclerosis Sister    Factor V Leiden deficiency Sister    Emphysema Sister    Hyperlipidemia Brother    Heart attack Maternal Grandmother    Lung cancer Maternal Grandfather        smoked    SOCIAL HISTORY: Social History   Tobacco Use   Smoking status: Former    Packs/day: 1.00    Years: 35.00    Additional pack years: 0.00    Total pack years: 35.00    Types: Cigarettes    Quit date: 09/16/2006    Years since quitting: 16.2   Smokeless tobacco: Never  Substance Use Topics   Alcohol use: No    Alcohol/week: 0.0 standard drinks of alcohol    No Known Allergies  Current Outpatient Medications  Medication Sig  Dispense Refill   albuterol (PROVENTIL HFA;VENTOLIN HFA) 108 (90 Base) MCG/ACT inhaler Inhale 2 puffs into the lungs every 6 (six) hours as needed for wheezing or shortness of breath. 1 Inhaler 6   albuterol (VENTOLIN HFA) 108 (90 Base) MCG/ACT inhaler Inhale 2 puffs into the lungs every 4 (four) hours as needed for wheezing or shortness of breath. 8 g 2   allopurinol (ZYLOPRIM) 100 MG tablet Take 2 tablets (200 mg total) by mouth daily. 60 tablet 0   Calcium Carbonate-Simethicone 750-80 MG CHEW Chew 1 tablet by mouth as needed (gas).      Cholecalciferol (VITAMIN D PO) Take 1,000 Units by mouth daily.     clobetasol cream (TEMOVATE) [id number] % Apply 1 Application topically 2 (two) times daily. 30 g 0   furosemide (LASIX) 20 MG tablet Take 2 tablets by mouth once daily 60 tablet 0   glipiZIDE (GLUCOTROL) 5 MG tablet TAKE 1 TABLET BY MOUTH TWICE DAILY BEFORE A MEAL 60 tablet 0   ibuprofen (ADVIL) 200 MG tablet Take 200 mg by mouth every 6 (six) hours as needed.     irbesartan (AVAPRO) 300 MG tablet Take 1 tablet by mouth once daily 90 tablet 3   ketoconazole (NIZORAL) 2 % shampoo APPLY TOPICALLY TO AFFECTED AREA ONCE DAILY AS NEEDED FOR IRRITATION 120 mL 2   metoprolol succinate (TOPROL-XL) 25 MG 24 hr tablet Take 1 tablet by mouth once daily 90 tablet 3   nitroGLYCERIN (NITROSTAT) 0.4 MG SL tablet Place 1 tablet (0.4 mg total) under the tongue every 5 (five) minutes x 3 doses as needed for chest pain. 25 tablet 0   OXYGEN Inhale 2 L into the lungs continuous.      pravastatin (PRAVACHOL) 20 MG tablet Take 1 tablet by mouth once daily 90 tablet 0   tacrolimus (PROTOPIC) 0.1 % ointment Apply topically 2 (two) times daily. 2 weeks     tamsulosin (FLOMAX) 0.4 MG CAPS capsule Take 1 capsule (0.4 mg total) by mouth at bedtime. 90 capsule 1   No current facility-administered medications for this visit.    REVIEW OF SYSTEMS:  [X]  denotes positive finding, [ ]  denotes negative finding Cardiac  Comments:   Chest pain or chest pressure:    Shortness of breath upon exertion:    Short of breath when lying flat:    Irregular heart rhythm:        Vascular    Pain in calf, thigh, or hip brought on by ambulation:  Pain in feet at night that wakes you up from your sleep:     Blood clot in your veins:    Leg swelling:         Pulmonary    Oxygen at home:    Productive cough:     Wheezing:         Neurologic    Sudden weakness in arms or legs:     Sudden numbness in arms or legs:     Sudden onset of difficulty speaking or slurred speech:    Temporary loss of vision in one eye:     Problems with dizziness:         Gastrointestinal    Blood in stool:     Vomited blood:         Genitourinary    Burning when urinating:     Blood in urine:        Psychiatric    Major depression:         Hematologic    Bleeding problems:    Problems with blood clotting too easily:        Skin    Rashes or ulcers:        Constitutional    Fever or chills:     PHYSICAL EXAM:   Vitals:   12/04/22 1402 12/04/22 1409  BP: 132/84 124/82  Pulse: 82   Resp: 20   Temp: 97.7 F (36.5 C)   TempSrc: Temporal   SpO2: 93%   Weight: (!) 315 lb 11.2 oz (143.2 kg)   Height: 5\' 9"  (1.753 m)     GENERAL: The patient is a well-nourished male, in no acute distress. The vital signs are documented above. CARDIAC: There is a regular rate and rhythm.  VASCULAR: I do not detect carotid bruits. He has bilateral lower extremity swelling. I cannot palpate pedal pulses.  Both feet however are warm and well-perfused. PULMONARY: There is good air exchange bilaterally without wheezing or rales. ABDOMEN: Soft and non-tender with normal pitched bowel sounds.  MUSCULOSKELETAL: There are no major deformities or cyanosis. NEUROLOGIC: No focal weakness or paresthesias are detected. SKIN: There are no ulcers or rashes noted. PSYCHIATRIC: The patient has a normal affect.  DATA:    VENOUS DUPLEX: I have  independently interpreted his venous duplex scan today.  This was of the right lower extremity only.  There was no evidence of DVT.  There was no deep venous reflux.  There was no superficial venous reflux.  Deitra Mayo [facility] [phone]

## 2022-12-26 ENCOUNTER — Other Ambulatory Visit: Payer: Self-pay | Admitting: Nurse Practitioner

## 2022-12-26 DIAGNOSIS — E1122 Type 2 diabetes mellitus with diabetic chronic kidney disease: Secondary | ICD-10-CM

## 2023-01-09 ENCOUNTER — Other Ambulatory Visit: Payer: Medicare Other

## 2023-01-09 DIAGNOSIS — E1122 Type 2 diabetes mellitus with diabetic chronic kidney disease: Secondary | ICD-10-CM

## 2023-01-09 DIAGNOSIS — N182 Chronic kidney disease, stage 2 (mild): Secondary | ICD-10-CM

## 2023-01-09 LAB — MICROALBUMIN / CREATININE URINE RATIO
Creatinine,U: 144.2 mg/dL
Microalb Creat Ratio: 0.9 mg/g (ref 0.0–30.0)
Microalb, Ur: 1.3 mg/dL (ref 0.0–1.9)

## 2023-02-04 ENCOUNTER — Other Ambulatory Visit: Payer: Self-pay | Admitting: Family

## 2023-02-04 DIAGNOSIS — E7800 Pure hypercholesterolemia, unspecified: Secondary | ICD-10-CM

## 2023-03-11 ENCOUNTER — Other Ambulatory Visit: Payer: Self-pay

## 2023-03-11 DIAGNOSIS — L210 Seborrhea capitis: Secondary | ICD-10-CM

## 2023-03-11 MED ORDER — KETOCONAZOLE 2 % EX SHAM: MEDICATED_SHAMPOO | CUTANEOUS | 2 refills | Status: DC

## 2023-03-11 NOTE — Telephone Encounter (Signed)
Received faxed refill request for medication.  Pended prescription for Tabitha's signature.

## 2023-04-05 ENCOUNTER — Other Ambulatory Visit: Payer: Self-pay | Admitting: Nurse Practitioner

## 2023-04-05 DIAGNOSIS — I5032 Chronic diastolic (congestive) heart failure: Secondary | ICD-10-CM

## 2023-04-16 ENCOUNTER — Encounter (INDEPENDENT_AMBULATORY_CARE_PROVIDER_SITE_OTHER): Payer: Self-pay

## 2023-04-29 ENCOUNTER — Other Ambulatory Visit: Payer: Self-pay | Admitting: Family

## 2023-04-29 DIAGNOSIS — N400 Enlarged prostate without lower urinary tract symptoms: Secondary | ICD-10-CM

## 2023-05-01 ENCOUNTER — Encounter (INDEPENDENT_AMBULATORY_CARE_PROVIDER_SITE_OTHER): Payer: Self-pay

## 2023-05-03 ENCOUNTER — Other Ambulatory Visit: Payer: Self-pay | Admitting: Family

## 2023-05-03 DIAGNOSIS — I5032 Chronic diastolic (congestive) heart failure: Secondary | ICD-10-CM

## 2023-05-12 ENCOUNTER — Ambulatory Visit (INDEPENDENT_AMBULATORY_CARE_PROVIDER_SITE_OTHER): Payer: Medicare Other

## 2023-05-12 ENCOUNTER — Ambulatory Visit: Payer: Medicare Other | Admitting: Internal Medicine

## 2023-05-12 VITALS — Ht 69.0 in | Wt 310.0 lb

## 2023-05-12 DIAGNOSIS — Z0000 Encounter for general adult medical examination without abnormal findings: Secondary | ICD-10-CM

## 2023-05-12 NOTE — Patient Instructions (Signed)
Alfred Carney , Thank you for taking time to come for your Medicare Wellness Visit. I appreciate your ongoing commitment to your health goals. Please review the following plan we discussed and let me know if I can assist you in the future.   Referrals/Orders/Follow-Ups/Clinician Recommendations: Aim for 30 minutes of exercise or brisk walking, 6-8 glasses of water, and 5 servings of fruits and vegetables each day.   This is a list of the screening recommended for you and due dates:  Health Maintenance  Topic Date Due   Zoster (Shingles) Vaccine (1 of 2) Never done   Complete foot exam   05/03/2022   COVID-19 Vaccine (3 - 2023-24 season) 05/17/2022   Eye exam for diabetics  07/31/2022   Hemoglobin A1C  04/10/2023   Medicare Annual Wellness Visit  05/09/2023   Flu Shot  04/17/2023   Yearly kidney function blood test for diabetes  05/16/2023   Yearly kidney health urinalysis for diabetes  01/09/2024   Colon Cancer Screening  01/23/2028   Pneumonia Vaccine  Completed   Hepatitis C Screening  Completed   HPV Vaccine  Aged Out   DTaP/Tdap/Td vaccine  Discontinued    Advanced directives: (Declined) Advance directive discussed with you today. Even though you declined this today, please call our office should you change your mind, and we can give you the proper paperwork for you to fill out.  Next Medicare Annual Wellness Visit scheduled for next year: Yes

## 2023-05-12 NOTE — Progress Notes (Addendum)
Subjective:   Alfred Carney is a 72 y.o. male who presents for Medicare Annual/Subsequent preventive examination.  Visit Complete: Virtual  I connected with  Alfred Carney on 05/12/23 by a audio enabled telemedicine application and verified that I am speaking with the correct person using two identifiers.  Patient Location: Home  Provider Location: Home Office  I discussed the limitations of evaluation and management by telemedicine. The patient expressed understanding and agreed to proceed.  Patient Medicare AWV questionnaire was completed by the patient on 05/09/23; I have confirmed that all information answered by patient is correct and no changes since this date.  Vital Signs: Because this visit was a virtual/telehealth visit, some criteria may be missing or patient reported. Any vitals not documented were not able to be obtained and vitals that have been documented are patient reported.    Review of Systems      Cardiac Risk Factors include: advanced age (>18men, >54 women);hypertension;male gender;sedentary lifestyle;obesity (BMI >30kg/m2);diabetes mellitus;dyslipidemia     Objective:    Today's Vitals   05/09/23 1241 05/12/23 0948  Weight:  (!) 310 lb (140.6 kg)  Height:  5\' 9"  (1.753 m)  PainSc: 7     Body mass index is 45.78 kg/m.     05/08/2022   10:40 AM 07/25/2020   12:05 PM 10/30/2018    7:20 AM 10/22/2018   11:06 PM 01/22/2018    8:39 AM 01/21/2018    8:45 AM 01/20/2018    2:37 PM  Advanced Directives  Does Patient Have a Medical Advance Directive? No No No No No No No  Copy of Healthcare Power of Attorney in Chart?      No - copy requested   Would patient like information on creating a medical advance directive? No - Patient declined No - Patient declined No - Patient declined No - Patient declined Yes (MAU/Ambulatory/Procedural Areas - Information given) No - Patient declined No - Patient declined    Current Medications (verified) Outpatient Encounter  Medications as of 05/12/2023  Medication Sig   albuterol (PROVENTIL HFA;VENTOLIN HFA) 108 (90 Base) MCG/ACT inhaler Inhale 2 puffs into the lungs every 6 (six) hours as needed for wheezing or shortness of breath.   albuterol (VENTOLIN HFA) 108 (90 Base) MCG/ACT inhaler Inhale 2 puffs into the lungs every 4 (four) hours as needed for wheezing or shortness of breath.   allopurinol (ZYLOPRIM) 100 MG tablet Take 2 tablets (200 mg total) by mouth daily.   Calcium Carbonate-Simethicone 750-80 MG CHEW Chew 1 tablet by mouth as needed (gas).    Cholecalciferol (VITAMIN D PO) Take 1,000 Units by mouth daily.   furosemide (LASIX) 20 MG tablet Take 2 tablets by mouth once daily   glipiZIDE (GLUCOTROL) 5 MG tablet TAKE 1 TABLET BY MOUTH TWICE DAILY BEFORE A MEAL   ibuprofen (ADVIL) 200 MG tablet Take 200 mg by mouth every 6 (six) hours as needed.   irbesartan (AVAPRO) 300 MG tablet Take 1 tablet by mouth once daily   ketoconazole (NIZORAL) 2 % shampoo Apply topically to affected area once daily as needed for irritation   metoprolol succinate (TOPROL-XL) 25 MG 24 hr tablet Take 1 tablet by mouth once daily   OXYGEN Inhale 2 L into the lungs continuous.    pravastatin (PRAVACHOL) 20 MG tablet Take 1 tablet by mouth once daily   tamsulosin (FLOMAX) 0.4 MG CAPS capsule Take 1 capsule by mouth at bedtime   clobetasol cream (TEMOVATE) 0.05 % Apply 1 Application topically  2 (two) times daily. (Patient not taking: Reported on 05/12/2023)   nitroGLYCERIN (NITROSTAT) 0.4 MG SL tablet Place 1 tablet (0.4 mg total) under the tongue every 5 (five) minutes x 3 doses as needed for chest pain. (Patient not taking: Reported on 05/12/2023)   tacrolimus (PROTOPIC) 0.1 % ointment Apply topically 2 (two) times daily. 2 weeks (Patient not taking: Reported on 05/12/2023)   No facility-administered encounter medications on file as of 05/12/2023.    Allergies (verified) Patient has no known allergies.   History: Past Medical  History:  Diagnosis Date   Chronic obstructive pulmonary disease (HCC) 2008   Moderate   Colon polyps    Community acquired pneumonia    Coronary artery disease    Degenerative joint disease    Diabetes mellitus without complication (HCC)    type 2   Emphysema lung (HCC)    Enlarged liver    fatty liver by '09 CT   Gastroesophageal reflux disease    Gout    H/O hiatal hernia    History of [place] spotted fever    Possible history of [place] Spotted Fever   Hyperlipidemia    Hypertension    Hypokalemia    diuretic induced, resolved   Microcytic anemia    iron pills   Morbid obesity (HCC)    Shortness of breath    Skin cancer    skin cancer lip removed   Sleep apnea    not currently using CPAP 05/20/13   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 LEVEL  10/23/2018       ESOPHAGOGASTRODUODENOSCOPY (EGD) WITH PROPOFOL N/A 01/22/2018   Procedure: ESOPHAGOGASTRODUODENOSCOPY (EGD) WITH PROPOFOL;  Surgeon: Napoleon Form, MD;  Location: [facility];  Service: Endoscopy;  Laterality: N/A;   HARDWARE REMOVAL Left 05/26/2013   Procedure: HARDWARE REMOVAL;  Surgeon: Nadara Mustard, MD;  Location: [facility] OR;  Service: Orthopedics;  Laterality: Left;  Left Total Hip Arthroplasty, Removal of Deep Hardware   HIP SURGERY     Status post left hip surgery with bone grafting   IR INJECT/THERA/INC NEEDLE/CATH/PLC EPI/LUMB/SAC W/IMG  10/23/2018   LEFT HEART CATHETERIZATION WITH CORONARY ANGIOGRAM N/A 01/31/2014   Procedure: LEFT HEART CATHETERIZATION WITH CORONARY ANGIOGRAM;  Surgeon:  Kathleene Hazel, MD;  Location: [facility] CATH LAB;  Service: Cardiovascular;  Laterality: N/A;   LUMBAR LAMINECTOMY/DECOMPRESSION MICRODISCECTOMY Left 10/28/2018   Procedure: Left Lumbar three-four Extraforaminal microdiscectomy;  Surgeon: Shirlean Kelly, MD;  Location: [facility] OR;  Service: Neurosurgery;  Laterality: Left;   THORACOABDOMINAL AORTIC ANEURYSM REPAIR     with right femoral and left iliac BPG and reimplantation of renal arteries.   TONSILLECTOMY     TONSILLECTOMY     TOTAL HIP ARTHROPLASTY Left 05/26/2013   Procedure: TOTAL HIP ARTHROPLASTY;  Surgeon: Nadara Mustard, MD;  Location: [facility] OR;  Service: Orthopedics;  Laterality: Left;  Left Total Hip Arthroplasty, Removal Deep Hardware   Family History  Problem Relation Age of Onset   Osteoarthritis Mother    Diabetes Mother    Pulmonary embolism Mother    Heart disease  Father        Coronary Artery Disease   Stroke Father    Multiple sclerosis Sister    Factor V Leiden deficiency Sister    Emphysema Sister    Hyperlipidemia Brother    Heart attack Maternal Grandmother    Lung cancer Maternal Grandfather        smoked   Social History   Socioeconomic History   Marital status: Married    Spouse name: Bonita Quin   Number of children: 4   Years of education: GED, ITT   Highest education level: Not on file  Occupational History   Occupation: architectural drawing-retired  Tobacco Use   Smoking status: Former    Current packs/day: 0.00    Average packs/day: 1 pack/day for 35.0 years (35.0 ttl pk-yrs)    Types: Cigarettes    Start date: 09/17/1971    Quit date: 09/16/2006    Years since quitting: 16.6   Smokeless tobacco: Never  Vaping Use   Vaping status: Never Used  Substance and Sexual Activity   Alcohol use: No    Alcohol/week: 0.0 standard drinks of alcohol   Drug use: No   Sexual activity: Not Currently  Other Topics Concern   Not on file  Social History Narrative   Lives with wife   Right Handed   Drinks 4-5 cups  caffeine daily   Social Determinants of Health   Financial Resource Strain: Low Risk  (05/09/2023)   Overall Financial Resource Strain (CARDIA)    Difficulty of Paying Living Expenses: Not very hard  Food Insecurity: No Food Insecurity (05/09/2023)   Hunger Vital Sign    Worried About Running Out of Food in the Last Year: Never true    Ran Out of Food in the Last Year: Never true  Transportation Needs: No Transportation Needs (05/09/2023)   PRAPARE - Administrator, Civil Service (Medical): No    Lack of Transportation (Non-Medical): No  Physical Activity: Inactive (05/09/2023)   Exercise Vital Sign    Days of Exercise per Week: 0 days    Minutes of Exercise per Session: 0 min  Stress: No Stress Concern Present (05/09/2023)   [employer] of Occupational Health - Occupational Stress Questionnaire    Feeling of Stress : Not at all  Social Connections: Moderately Integrated (05/09/2023)   Social Connection and Isolation Panel [NHANES]    Frequency of Communication with Friends and Family: More than three times a week    Frequency of Social Gatherings with Friends and Family: Once a week    Attends Religious Services: Never    Database administrator or Organizations: Yes    Attends Engineer, structural: 1 to 4 times per year    Marital Status: Married    Tobacco Counseling Counseling given: Not Answered   Clinical Intake:  Pre-visit preparation completed: Yes  Pain : 0-10 Pain Score: 7  Pain Type: Acute pain Pain Location: Generalized Pain Orientation: Left, Right Pain Descriptors / Indicators: Sharp Pain Onset: 1 to 4 weeks ago     BMI - recorded: 45.78 Nutritional Status: BMI > 30  Obese Nutritional Risks: None Diabetes: Yes CBG done?: No CBG resulted in Enter/ Edit results?: No Did pt. bring in CBG monitor from home?: No  How often do you need to have someone help you when you read instructions, pamphlets, or other written materials from  your doctor or pharmacy?: 1 - Never  Interpreter Needed?: No  Information entered by ::  C.Delson Dulworth LPN   Activities of Daily Living    05/09/2023   12:41 PM  In your present state of health, do you have any difficulty performing the following activities:  Hearing? 1  Comment has bilateral hearing aids  Vision? 0  Difficulty concentrating or making decisions? 0  Walking or climbing stairs? 0  Dressing or bathing? 0  Doing errands, shopping? 0  Preparing Food and eating ? N  Using the Toilet? N  In the past six months, have you accidently leaked urine? Y  Comment occasionally  Do you have problems with loss of bowel control? N  Managing your Medications? N  Managing your Finances? N  Housekeeping or managing your Housekeeping? N    Patient Care Team: Mort Sawyers, FNP as PCP - General (Family Medicine) [place], Peter M, MD as PCP - Cardiology (Cardiology) [place], Peter M, MD as Consulting Physician (Cardiology) Nyoka Cowden, MD as Consulting Physician (Pulmonary Disease) Vilinda Flake, [facility] (Inactive) as Pharmacist (Pharmacist)  Indicate any recent Medical Services you may have received from other than [facility] providers in the past year (date may be approximate).     Assessment:   This is a routine wellness examination for Vicent.  Hearing/Vision screen Hearing Screening - Comments:: Bilateral hearing aids Vision Screening - Comments:: Readers - [facility] - UTD on Eye Exams  Dietary issues and exercise activities discussed:     Goals Addressed             This Visit's Progress    Patient Stated       Maintain current health status.       Depression Screen    05/12/2023    9:53 AM 10/10/2022   12:36 PM 05/08/2022   10:36 AM 07/25/2020   12:07 PM 06/22/2020   10:24 AM 02/15/2019   10:48 AM 01/20/2018    2:34 PM  PHQ 2/9 Scores  PHQ - 2 Score 0 0 0 0 0 0 1  PHQ- 9 Score  0 0 0  3 7    Fall Risk    05/09/2023   12:41 PM 10/10/2022   12:36 PM  05/08/2022   10:41 AM 07/25/2020   12:07 PM 01/20/2018    2:34 PM  Fall Risk   Falls in the past year? 0 0 0 0 Yes  Number falls in past yr: 0 0 0 0 2 or more  Injury with Fall? 0 0 0 0 Yes  Risk Factor Category      High Fall Risk  Risk for fall due to : No Fall Risks  No Fall Risks Medication side effect History of fall(s)  Follow up Falls prevention discussed;Falls evaluation completed Falls evaluation completed;Education provided Falls evaluation completed Falls evaluation completed;Falls prevention discussed Falls evaluation completed;Education provided;Falls prevention discussed    MEDICARE RISK AT HOME: Medicare Risk at Home Any stairs in or around the home?: Yes If so, are there any without handrails?: No Home free of loose throw rugs in walkways, pet beds, electrical cords, etc?: Yes Adequate lighting in your home to reduce risk of falls?: Yes Life alert?: No Use of a cane, walker or w/c?: Yes Grab bars in the bathroom?: Yes Shower chair or bench in shower?: Yes Elevated toilet seat or a handicapped toilet?: Yes  TIMED UP AND GO:  Was the test performed?  No    Cognitive Function:    07/25/2020   12:10 PM  MMSE - Mini Mental State Exam  Orientation to time  5  Orientation to Place 5  Registration 3  Attention/ Calculation 5  Recall 3  Language- repeat 1        05/12/2023   10:01 AM 05/08/2022   10:47 AM  6CIT Screen  What Year? 0 points 0 points  What month? 0 points 0 points  What time? 0 points 0 points  Count back from 20 0 points 0 points  Months in reverse 0 points 0 points  Repeat phrase 0 points 2 points  Total Score 0 points 2 points    Immunizations Immunization History  Administered Date(s) Administered   PFIZER(Purple Top)SARS-COV-2 Vaccination 11/17/2019, 12/08/2019   Pneumococcal Conjugate-13 01/24/2015   Pneumococcal Polysaccharide-23 09/18/2011, 11/12/2016   Tdap 06/26/2010    TDAP status: Due, Education has been provided regarding the  importance of this vaccine. Advised may receive this vaccine at local pharmacy or Health Dept. Aware to provide a copy of the vaccination record if obtained from local pharmacy or Health Dept. Verbalized acceptance and understanding.  Flu Vaccine status: Due, Education has been provided regarding the importance of this vaccine. Advised may receive this vaccine at local pharmacy or Health Dept. Aware to provide a copy of the vaccination record if obtained from local pharmacy or Health Dept. Verbalized acceptance and understanding.  Pneumococcal vaccine status: Declined,  Education has been provided regarding the importance of this vaccine but patient still declined. Advised may receive this vaccine at local pharmacy or Health Dept. Aware to provide a copy of the vaccination record if obtained from local pharmacy or Health Dept. Verbalized acceptance and understanding.   Covid-19 vaccine status: Declined, Education has been provided regarding the importance of this vaccine but patient still declined. Advised may receive this vaccine at local pharmacy or Health Dept.or vaccine clinic. Aware to provide a copy of the vaccination record if obtained from local pharmacy or Health Dept. Verbalized acceptance and understanding.  Qualifies for Shingles Vaccine? Yes   Zostavax completed No   Shingrix Completed?: No.    Education has been provided regarding the importance of this vaccine. Patient has been advised to call insurance company to determine out of pocket expense if they have not yet received this vaccine. Advised may also receive vaccine at local pharmacy or Health Dept. Verbalized acceptance and understanding.  Screening Tests Health Maintenance  Topic Date Due   Zoster Vaccines- Shingrix (1 of 2) Never done   FOOT EXAM  05/03/2022   COVID-19 Vaccine (3 - 2023-24 season) 05/17/2022   OPHTHALMOLOGY EXAM  07/31/2022   HEMOGLOBIN A1C  04/10/2023   INFLUENZA VACCINE  04/17/2023   Diabetic kidney  evaluation - eGFR measurement  05/16/2023   Diabetic kidney evaluation - Urine ACR  01/09/2024   Medicare Annual Wellness (AWV)  05/11/2024   Colonoscopy  01/23/2028   Pneumonia Vaccine 33+ Years old  Completed   Hepatitis C Screening  Completed   HPV VACCINES  Aged Out   DTaP/Tdap/Td  Discontinued    Health Maintenance  Health Maintenance Due  Topic Date Due   Zoster Vaccines- Shingrix (1 of 2) Never done   FOOT EXAM  05/03/2022   COVID-19 Vaccine (3 - 2023-24 season) 05/17/2022   OPHTHALMOLOGY EXAM  07/31/2022   HEMOGLOBIN A1C  04/10/2023   INFLUENZA VACCINE  04/17/2023   Diabetic kidney evaluation - eGFR measurement  05/16/2023    Colorectal cancer screening: Type of screening: Colonoscopy. Completed 01/22/18. Repeat every 10 years  Lung Cancer Screening: (Low Dose CT Chest recommended if Age 63-80  years, 20 pack-year currently smoking OR have quit w/in 15years.) does not qualify.   Lung Cancer Screening Referral:    Additional Screening:  Hepatitis C Screening: does qualify; Completed 11/04/18  Vision Screening: Recommended annual ophthalmology exams for early detection of glaucoma and other disorders of the eye. Is the patient up to date with their annual eye exam?  Yes  Who is the provider or what is the name of the office in which the patient attends annual eye exams? [facility] If pt is not established with a provider, would they like to be referred to a provider to establish care? Yes .   Dental Screening: Recommended annual dental exams for proper oral hygiene  Diabetic Foot Exam: Diabetic Foot Exam: Overdue, Pt has been advised about the importance in completing this exam. Pt is scheduled for diabetic foot exam on next ov with PCP.  Community Resource Referral / Chronic Care Management: CRR required this visit?  No   CCM required this visit?  No     Plan:     I have personally reviewed and noted the following in the patient's chart:   Medical and  social history Use of alcohol, tobacco or illicit drugs  Current medications and supplements including opioid prescriptions. Patient is not currently taking opioid prescriptions. Functional ability and status Nutritional status Physical activity Advanced directives List of other physicians Hospitalizations, surgeries, and ER visits in previous 12 months Vitals Screenings to include cognitive, depression, and falls Referrals and appointments  In addition, I have reviewed and discussed with patient certain preventive protocols, quality metrics, and best practice recommendations. A written personalized care plan for preventive services as well as general preventive health recommendations were provided to patient.     Maryan Puls, LPN   1/61/0960   After Visit Summary: (MyChart) Due to this being a telephonic visit, the after visit summary with patients personalized plan was offered to patient via MyChart   Nurse Notes: Vaccinations: declines all Influenza vaccine: recommend every Fall Pneumococcal vaccine: recommend once per lifetime Prevnar-20 Tdap vaccine: recommend every 10 years Shingles vaccine: recommend Shingrix which is 2 doses 2-6 months apart and over 90% effective     Covid-19: recommend 2 doses one month apart with a booster 6 months later

## 2023-05-20 ENCOUNTER — Encounter: Payer: Self-pay | Admitting: Family

## 2023-05-20 ENCOUNTER — Ambulatory Visit (INDEPENDENT_AMBULATORY_CARE_PROVIDER_SITE_OTHER): Payer: Medicare Other | Admitting: Family

## 2023-05-20 VITALS — BP 138/78 | HR 58 | Temp 98.0°F | Ht 69.0 in | Wt 314.0 lb

## 2023-05-20 DIAGNOSIS — I5032 Chronic diastolic (congestive) heart failure: Secondary | ICD-10-CM

## 2023-05-20 DIAGNOSIS — E559 Vitamin D deficiency, unspecified: Secondary | ICD-10-CM

## 2023-05-20 DIAGNOSIS — I10 Essential (primary) hypertension: Secondary | ICD-10-CM | POA: Diagnosis not present

## 2023-05-20 DIAGNOSIS — M2550 Pain in unspecified joint: Secondary | ICD-10-CM | POA: Diagnosis not present

## 2023-05-20 DIAGNOSIS — N401 Enlarged prostate with lower urinary tract symptoms: Secondary | ICD-10-CM | POA: Diagnosis not present

## 2023-05-20 DIAGNOSIS — Z8739 Personal history of other diseases of the musculoskeletal system and connective tissue: Secondary | ICD-10-CM | POA: Diagnosis not present

## 2023-05-20 DIAGNOSIS — R600 Localized edema: Secondary | ICD-10-CM | POA: Diagnosis not present

## 2023-05-20 DIAGNOSIS — R0609 Other forms of dyspnea: Secondary | ICD-10-CM

## 2023-05-20 DIAGNOSIS — E1159 Type 2 diabetes mellitus with other circulatory complications: Secondary | ICD-10-CM | POA: Diagnosis not present

## 2023-05-20 DIAGNOSIS — E1122 Type 2 diabetes mellitus with diabetic chronic kidney disease: Secondary | ICD-10-CM

## 2023-05-20 DIAGNOSIS — E1169 Type 2 diabetes mellitus with other specified complication: Secondary | ICD-10-CM

## 2023-05-20 DIAGNOSIS — I739 Peripheral vascular disease, unspecified: Secondary | ICD-10-CM

## 2023-05-20 DIAGNOSIS — Z0000 Encounter for general adult medical examination without abnormal findings: Secondary | ICD-10-CM

## 2023-05-20 DIAGNOSIS — E7800 Pure hypercholesterolemia, unspecified: Secondary | ICD-10-CM | POA: Diagnosis not present

## 2023-05-20 DIAGNOSIS — I152 Hypertension secondary to endocrine disorders: Secondary | ICD-10-CM

## 2023-05-20 DIAGNOSIS — E785 Hyperlipidemia, unspecified: Secondary | ICD-10-CM

## 2023-05-20 DIAGNOSIS — Z0001 Encounter for general adult medical examination with abnormal findings: Secondary | ICD-10-CM

## 2023-05-20 DIAGNOSIS — Z2821 Immunization not carried out because of patient refusal: Secondary | ICD-10-CM

## 2023-05-20 DIAGNOSIS — J9611 Chronic respiratory failure with hypoxia: Secondary | ICD-10-CM

## 2023-05-20 DIAGNOSIS — N138 Other obstructive and reflux uropathy: Secondary | ICD-10-CM

## 2023-05-20 LAB — COMPREHENSIVE METABOLIC PANEL
ALT: 20 U/L (ref 0–53)
AST: 12 U/L (ref 0–37)
Albumin: 3.9 g/dL (ref 3.5–5.2)
Alkaline Phosphatase: 80 U/L (ref 39–117)
BUN: 16 mg/dL (ref 6–23)
CO2: 32 meq/L (ref 19–32)
Calcium: 9.2 mg/dL (ref 8.4–10.5)
Chloride: 104 meq/L (ref 96–112)
Creatinine, Ser: 1.08 mg/dL (ref 0.40–1.50)
GFR: 68.12 mL/min (ref >60.00)
Glucose, Bld: 143 mg/dL — ABNORMAL HIGH (ref 70–99)
Potassium: 4.3 meq/L (ref 3.5–5.1)
Sodium: 146 meq/L — ABNORMAL HIGH (ref 135–145)
Total Bilirubin: 0.7 mg/dL (ref 0.2–1.2)
Total Protein: 6.7 g/dL (ref 6.0–8.3)

## 2023-05-20 LAB — LIPID PANEL
Cholesterol: 130 mg/dL (ref 0–200)
HDL: 33.4 mg/dL — ABNORMAL LOW (ref >39.00)
LDL Cholesterol: 75 mg/dL (ref 0–99)
NonHDL: 96.66
Total CHOL/HDL Ratio: 4
Triglycerides: 110 mg/dL (ref 0.0–149.0)
VLDL: 22 mg/dL (ref 0.0–40.0)

## 2023-05-20 LAB — SEDIMENTATION RATE: Sed Rate: 31 mm/h — ABNORMAL HIGH (ref 0–20)

## 2023-05-20 LAB — HEMOGLOBIN A1C: Hgb A1c MFr Bld: 6.4 % (ref 4.6–6.5)

## 2023-05-20 LAB — URIC ACID: Uric Acid, Serum: 7.2 mg/dL (ref 4.0–7.8)

## 2023-05-20 MED ORDER — MELOXICAM 7.5 MG PO TABS
7.5000 mg | ORAL_TABLET | Freq: Every day | ORAL | 0 refills | Status: DC
Start: 2023-05-20 — End: 2023-07-08

## 2023-05-20 NOTE — Assessment & Plan Note (Signed)
Ordered lipid panel, pending results. Work on low cholesterol diet and exercise as tolerated  

## 2023-05-20 NOTE — Assessment & Plan Note (Signed)
continue f/u with vascular as scheduled.

## 2023-05-20 NOTE — Progress Notes (Signed)
Subjective:   Patient ID: Alfred Carney, male    DOB: [birth year]    MRN: [id number]   Brief patient profile:  56 yowm quit smoking 2008 with baseline weight of 240 when he stopped smoking in March of 2008 p pna with doe only some better then  worse since summer 2015 on so referred 05/17/2015 to pulmonary clinic by Dr Devra Dopp for copd eval as had been seen here in 2012 with GOLD II criteria.     History of Present Illness 05/17/2015 1st [facility] office visit/ [name]  / copd eval on ACEi  Chief Complaint  Patient presents with   Pulmonary Consult    Referre by Dr. Devra Dopp. Pt c/o SOB x 10 yrs, gradually worse over the past yr.  He states that he gets SOB with minimal exertion such as taking a shower or walking 50 yrds on flat surface.   indolent onset progressive doe but always ok at rest/  Sleeps in recliner x 10 years Has osa/ could not tol cpap and tends to fall asleep p eats Assoc with sense of nasal and throat congestion but very little cough - does have hb but controls with prn ppi Had been on several different inhalers including according to the records Spiriva but didn't think any of them really helped and stopped them years ago rec Stop lisinopril  Valsartan 80 mg one daily in place of lisinopril Prilosec Take 30- 60 min before your first and last meals of the day just until you return  GERD  Diet       11/05/2022  f/u ov/[name] re: GOLD 2 but 02 dep   maint on 2lpm 24/7  complicated by MO Chief Complaint  Patient presents with   Follow-up    Follow up.  Last ov 08/11/2020.  Doing well.  Oxygen 2 lpm continuous (tank) 24/7  Dyspnea:  walking to car and back to house  Cough: none  Sleeping: recliner 30 degrees  SABA use: none  02: 2lpm hs and daytime/ does not titrate Covid status:   vax x 2  no infection Rec Make sure you check your oxygen saturation  AT  your highest level of activity (not after you stop)   to be sure it stays over 90% and adjust  02  flow upward to maintain this level if needed but remember to turn it back to previous settings when you stop (to conserve your supply). Only use your albuterol as a rescue medication  Also  Ok to try albuterol 15 min before an activity (on alternating days)  that you know would usually make you short of breath         05/21/2023  6 m  f/u ov/[name] re:  GOLD 2 but 02 dep  maint on 02 2lpm hs and daytime  prn  Chief Complaint  Patient presents with   Follow-up    SOB with exertion persistent.  Dyspnea:  limited by back and knees breathing may be a bit better but hard to tell dueo to ortho limitations  Cough: none  Sleeping: recliner 30 dgrees s  resp cc  SABA use: 2-3 x per week    No obvious day to day or daytime variability or assoc excess/ purulent sputum or mucus plugs or hemoptysis or cp or chest tightness, subjective wheeze or overt sinus or hb symptoms.    Also denies any obvious fluctuation of symptoms with weather or environmental changes or other aggravating or alleviating factors except  as outlined above   No unusual exposure hx or h/o childhood pna/ asthma or knowledge of premature birth.  Current Allergies, Complete Past Medical History, Past Surgical History, Family History, and Social History were reviewed in [employer] record.  ROS  The following are not active complaints unless bolded Hoarseness, sore throat, dysphagia, dental problems, itching, sneezing,  nasal congestion or discharge of excess mucus or purulent secretions, ear ache,   fever, chills, sweats, unintended wt loss or wt gain, classically pleuritic or exertional cp,  orthopnea pnd or arm/hand swelling  or leg swelling, presyncope, palpitations, abdominal pain, anorexia, nausea, vomiting, diarrhea  or change in bowel habits or change in bladder habits, change in stools or change in urine, dysuria, hematuria,  rash, arthralgias, visual complaints, headache, numbness, weakness or ataxia or  problems with walking or coordination,  change in mood or  memory.        Current Meds  Medication Sig   albuterol (VENTOLIN HFA) 108 (90 Base) MCG/ACT inhaler Inhale 2 puffs into the lungs every 4 (four) hours as needed for wheezing or shortness of breath.   allopurinol (ZYLOPRIM) 100 MG tablet Take 2 tablets (200 mg total) by mouth daily.   Calcium Carbonate-Simethicone 750-80 MG CHEW Chew 1 tablet by mouth as needed (gas).    Cholecalciferol (VITAMIN D PO) Take 1,000 Units by mouth daily.   clobetasol cream (TEMOVATE) 0.05 % Apply 1 Application topically 2 (two) times daily.   furosemide (LASIX) 20 MG tablet Take 2 tablets by mouth once daily   glipiZIDE (GLUCOTROL) 5 MG tablet TAKE 1 TABLET BY MOUTH TWICE DAILY BEFORE A MEAL   ibuprofen (ADVIL) 200 MG tablet Take 200 mg by mouth every 6 (six) hours as needed.   irbesartan (AVAPRO) 300 MG tablet Take 1 tablet by mouth once daily   ketoconazole (NIZORAL) 2 % shampoo Apply topically to affected area once daily as needed for irritation   meloxicam (MOBIC) 7.5 MG tablet Take 1 tablet (7.5 mg total) by mouth daily.   metoprolol succinate (TOPROL-XL) 25 MG 24 hr tablet Take 1 tablet by mouth once daily   nitroGLYCERIN (NITROSTAT) 0.4 MG SL tablet Place 1 tablet (0.4 mg total) under the tongue every 5 (five) minutes x 3 doses as needed for chest pain.   OXYGEN Inhale 2 L into the lungs continuous.    pravastatin (PRAVACHOL) 20 MG tablet Take 1 tablet by mouth once daily   tacrolimus (PROTOPIC) 0.1 % ointment Apply topically 2 (two) times daily. 2 weeks   tamsulosin (FLOMAX) 0.4 MG CAPS capsule Take 1 capsule by mouth at bedtime                 Objective:   Physical Exam  05/21/2023        314  11/05/2022      328   08/11/2020    322  02/07/2020      329  08/03/2019   329  06/28/2015        326 > 08/15/2015  323 >  11/19/2017  326 >  01/05/2018 320 > 02/02/2018    324 >  05/06/2018  312 >  08/06/2018  317 > 11/13/2018   297  > 02/02/2019   318     05/17/15 329 lb (149.233 kg)  01/19/15 322 lb (146.058 kg)  01/18/15 319 lb (144.697 kg)   Vital signs reviewed  05/21/2023  - Note at rest 02 sats  94% on 2lpm POC  General appearance:    MO (by bmi) pleasant wm nad    HEENT : Oropharynx  clear   Nasal turbinates nl    NECK :  without  apparent JVD/ palpable Nodes/TM    LUNGS: no acc muscle use,  Min barrel  contour chest wall with bilateral  slightly decreased bs s audible wheeze and  without cough on insp or exp maneuvers and min  Hyperresonant  to  percussion bilaterally    CV:  RRR  no s3 or murmur or increase in P2, and no edema   ABD:  obese soft and nontender    MS:  Nl gait/ ext warm without deformities Or obvious joint restrictions  calf tenderness, cyanosis or clubbing     SKIN: warm and dry with severe venous stasis changes bilaterally = red and scaly  NEURO:  alert, approp, nl sensorium with  no motor or cerebellar deficits apparent.                 Assessment & Plan:

## 2023-05-20 NOTE — Progress Notes (Signed)
Subjective:  Patient ID: Alfred Carney, male    DOB: 1951/03/02  Age: 72 y.o. MRN: [id number]  Patient Care Team: Mort Sawyers, FNP as PCP - General (Family Medicine) [place], Peter M, MD as PCP - Cardiology (Cardiology) [place], Peter M, MD as Consulting Physician (Cardiology) Nyoka Cowden, MD as Consulting Physician (Pulmonary Disease) Vilinda Flake, [facility] (Inactive) as Pharmacist (Pharmacist)   CC:  Chief Complaint  Patient presents with   Annual Exam    HPI Alfred Carney is a 72 y.o. male who presents today for an annual physical exam. He reports consuming a general diet. The patient does not participate in regular exercise at present. He generally feels well. He reports sleeping well. He does not have additional problems to discuss today.   Vision:Within last year Dental:Receives regular dental care STD:The patient denies history of sexually transmitted disease.      Colonoscopy: was due for repeat colonoscopy and egd may 2020, per office note with Dr. Lavon Paganini last visit 04/14/2018 . They state they were 'never called to schedule' and were not sure they had to repeat.    Seeing vascular regularly, left carotid stenosis. Asymptomatic.  S/p repair of thoracoabdominal aneurysm, repaired 2004.  PAD: asymptomatic followed by vascular , h/o venous stasis ulcer.   Pt is with acute concerns.   States pain in all of his joints leading him to take daily ibuprofen, usually 800 me each day. Hips, back, shoulders, neck.   Bil elbows, swollen on left elbow and slight on right. Still tenderness on left elbow. At times feels a bit knot on this, and with some redness and tender. Did complete trial colchicine without any improvement. No longer warm.   Oxygen therapy, sees pulmonary about every six months has f/u again tomorrow.   Advanced Directives Patient does not have advanced directives  DEPRESSION SCREENING    05/20/2023    9:22 AM 05/12/2023    9:53 AM 10/10/2022   12:36 PM  05/08/2022   10:36 AM 07/25/2020   12:07 PM 06/22/2020   10:24 AM 02/15/2019   10:48 AM  PHQ 2/9 Scores  PHQ - 2 Score 0 0 0 0 0 0 0  PHQ- 9 Score 1  0 0 0  3     ROS: Negative unless specifically indicated above in HPI.    Current Outpatient Medications:    albuterol (VENTOLIN HFA) 108 (90 Base) MCG/ACT inhaler, Inhale 2 puffs into the lungs every 4 (four) hours as needed for wheezing or shortness of breath., Disp: 8 g, Rfl: 2   allopurinol (ZYLOPRIM) 100 MG tablet, Take 2 tablets (200 mg total) by mouth daily., Disp: 60 tablet, Rfl: 0   Calcium Carbonate-Simethicone 750-80 MG CHEW, Chew 1 tablet by mouth as needed (gas). , Disp: , Rfl:    Cholecalciferol (VITAMIN D PO), Take 1,000 Units by mouth daily., Disp: , Rfl:    clobetasol cream (TEMOVATE) 0.05 %, Apply 1 Application topically 2 (two) times daily., Disp: 30 g, Rfl: 0   furosemide (LASIX) 20 MG tablet, Take 2 tablets by mouth once daily, Disp: 60 tablet, Rfl: 0   glipiZIDE (GLUCOTROL) 5 MG tablet, TAKE 1 TABLET BY MOUTH TWICE DAILY BEFORE A MEAL, Disp: 90 tablet, Rfl: 3   ibuprofen (ADVIL) 200 MG tablet, Take 200 mg by mouth every 6 (six) hours as needed., Disp: , Rfl:    irbesartan (AVAPRO) 300 MG tablet, Take 1 tablet by mouth once daily, Disp: 90 tablet, Rfl: 3   ketoconazole (  NIZORAL) 2 % shampoo, Apply topically to affected area once daily as needed for irritation, Disp: 120 mL, Rfl: 2   meloxicam (MOBIC) 7.5 MG tablet, Take 1 tablet (7.5 mg total) by mouth daily., Disp: 30 tablet, Rfl: 0   metoprolol succinate (TOPROL-XL) 25 MG 24 hr tablet, Take 1 tablet by mouth once daily, Disp: 90 tablet, Rfl: 3   nitroGLYCERIN (NITROSTAT) 0.4 MG SL tablet, Place 1 tablet (0.4 mg total) under the tongue every 5 (five) minutes x 3 doses as needed for chest pain., Disp: 25 tablet, Rfl: 0   OXYGEN, Inhale 2 L into the lungs continuous. , Disp: , Rfl:    pravastatin (PRAVACHOL) 20 MG tablet, Take 1 tablet by mouth once daily, Disp: 90 tablet,  Rfl: 3   tacrolimus (PROTOPIC) 0.1 % ointment, Apply topically 2 (two) times daily. 2 weeks, Disp: , Rfl:    tamsulosin (FLOMAX) 0.4 MG CAPS capsule, Take 1 capsule by mouth at bedtime, Disp: 90 capsule, Rfl: 0    Objective:    BP 138/78 (BP Location: Left Arm, Patient Position: Sitting, Cuff Size: Large)   Pulse (!) 58   Temp 98 F (36.7 C) (Oral)   Ht 5\' 9"  (1.753 m)   Wt (!) 314 lb (142.4 kg)   SpO2 96% Comment: 2L pulse  BMI 46.37 kg/m   BP Readings from Last 3 Encounters:  05/20/23 138/78  12/04/22 124/82  11/07/22 (!) 166/59      Physical Exam Vitals reviewed.  Constitutional:      General: He is not in acute distress.    Appearance: Normal appearance. He is obese. He is not ill-appearing, toxic-appearing or diaphoretic.  HENT:     Head: Normocephalic.     Right Ear: Tympanic membrane normal.     Left Ear: Tympanic membrane normal.     Nose: Nose normal.     Mouth/Throat:     Mouth: Mucous membranes are moist.  Eyes:     Pupils: Pupils are equal, round, and reactive to light.  Cardiovascular:     Rate and Rhythm: Normal rate and regular rhythm.  Pulmonary:     Effort: Pulmonary effort is normal.     Breath sounds: Normal breath sounds.  Abdominal:     General: Abdomen is flat.     Tenderness: There is no abdominal tenderness.  Musculoskeletal:     Left elbow: Swelling present. Normal range of motion (pain with ROM). Tenderness present in medial epicondyle and lateral epicondyle.     Right lower leg: 2+ Pitting Edema present.     Left lower leg: 2+ Pitting Edema present.  Skin:    General: Skin is warm.  Neurological:     General: No focal deficit present.     Mental Status: He is alert and oriented to person, place, and time. Mental status is at baseline.  Psychiatric:        Mood and Affect: Mood normal.        Behavior: Behavior normal.        Thought Content: Thought content normal.        Judgment: Judgment normal.    Diabetic Foot Form -  Detailed   Diabetic Foot Exam - detailed Can the patient see the bottom of their feet?: No Are the shoes appropriate in style and fit?: Yes Is there swelling or and abnormal foot shape?: Yes Is there a claw toe deformity?: No Is there elevated skin temparature?: No Is there foot or ankle muscle weakness?:  No Normal Range of Motion: Yes Right posterior Tibialias: Diminished Left posterior Tibialias: Diminished   Right Dorsalis Pedis: Diminished Left Dorsalis Pedis: Diminished  Semmes-Weinstein Monofilament Test R Site 1-Great Toe: Pos L Site 1-Great Toe: Pos                 Assessment & Plan:  Polyarthralgia -     ANA -     Rheumatoid factor -     Sedimentation rate -     Meloxicam; Take 1 tablet (7.5 mg total) by mouth daily.  Dispense: 30 tablet; Refill: 0  Essential hypertension  Type 2 diabetes mellitus with stage 2 chronic kidney disease, without long-term current use of insulin (HCC) Assessment & Plan: A1c today pending results.  Foot exam in office today. Eye exam overdue, make appt.    Orders: -     Hemoglobin A1c -     Comprehensive metabolic panel  BPH with obstruction/lower urinary tract symptoms -     PSA  Pure hypercholesterolemia -     Lipid panel  Vitamin D deficiency  History of gout -     Uric acid  DOE (dyspnea on exertion) -     Brain natriuretic peptide  Pedal edema Assessment & Plan: Non compliant with compression socks Advised to elevate legs as able, watch salt in diet.    Orders: -     Brain natriuretic peptide  Vaccination declined  Encounter for general adult medical examination without abnormal findings  PAD (peripheral artery disease) (HCC) Assessment & Plan: continue f/u with vascular as scheduled.     Hypertension associated with diabetes [facility]) Assessment & Plan: Stable today in office.     Chronic diastolic heart failure (HCC) Assessment & Plan: Reviewed echo. Stage 1     Chronic respiratory  failure with hypoxia and hypercapnia (HCC) Assessment & Plan: Pulmonary appt pending near future.  Continue with oxygen as prescribed.  Stable.     Hyperlipidemia associated with type 2 diabetes mellitus (HCC) Assessment & Plan: Ordered lipid panel, pending results. Work on low cholesterol diet and exercise as tolerated        Follow-up: Return in about 6 months (around 11/17/2023) for f/u diabetes.   Mort Sawyers, FNP

## 2023-05-20 NOTE — Patient Instructions (Addendum)
  Call Dr. Elana Alm office to check on status of when due for colonoscopy per office notes looks like it was due in 2020.   Meloxicam, trial start , try taking this in place of ibuprofen.   Heat to site, lidocaine patches as needed.  Exercises to area as tolerated.    Regards,   Mort Sawyers FNP-C

## 2023-05-20 NOTE — Assessment & Plan Note (Signed)
Pulmonary appt pending near future.  Continue with oxygen as prescribed.  Stable.

## 2023-05-20 NOTE — Assessment & Plan Note (Signed)
Stable today in office.

## 2023-05-20 NOTE — Assessment & Plan Note (Signed)
A1c today pending results.  Foot exam in office today. Eye exam overdue, make appt.

## 2023-05-20 NOTE — Assessment & Plan Note (Signed)
Non compliant with compression socks Advised to elevate legs as able, watch salt in diet.

## 2023-05-20 NOTE — Assessment & Plan Note (Signed)
Reviewed echo. Stage 1

## 2023-05-21 ENCOUNTER — Other Ambulatory Visit: Payer: Self-pay | Admitting: Family

## 2023-05-21 ENCOUNTER — Ambulatory Visit: Payer: Medicare Other | Admitting: Internal Medicine

## 2023-05-21 ENCOUNTER — Encounter: Payer: Self-pay | Admitting: Internal Medicine

## 2023-05-21 VITALS — BP 130/64 | HR 62 | Temp 97.6°F | Ht 69.0 in | Wt 314.6 lb

## 2023-05-21 DIAGNOSIS — J9612 Chronic respiratory failure with hypercapnia: Secondary | ICD-10-CM

## 2023-05-21 DIAGNOSIS — E1169 Type 2 diabetes mellitus with other specified complication: Secondary | ICD-10-CM

## 2023-05-21 DIAGNOSIS — J9611 Chronic respiratory failure with hypoxia: Secondary | ICD-10-CM | POA: Diagnosis not present

## 2023-05-21 DIAGNOSIS — Z6841 Body Mass Index (BMI) 40.0 and over, adult: Secondary | ICD-10-CM

## 2023-05-21 DIAGNOSIS — J449 Chronic obstructive pulmonary disease, unspecified: Secondary | ICD-10-CM | POA: Diagnosis not present

## 2023-05-21 LAB — BRAIN NATRIURETIC PEPTIDE: Pro B Natriuretic peptide (BNP): 61 pg/mL (ref 0.0–100.0)

## 2023-05-21 LAB — PSA: PSA: 0.91 ng/mL (ref 0.10–4.00)

## 2023-05-21 MED ORDER — PRAVASTATIN SODIUM 40 MG PO TABS
40.0000 mg | ORAL_TABLET | Freq: Every day | ORAL | 3 refills | Status: DC
Start: 2023-05-21 — End: 2024-07-19

## 2023-05-21 NOTE — Assessment & Plan Note (Signed)
Body mass index is 46.46 kg/m.  -  trending down slightly/ encouraged/ using recumbent bike now Lab Results  Component Value Date   TSH 3.74 11/19/2017      Contributing to doe and risk of GERD/ dvt/ PE  >>>   reviewed the need and the process to achieve and maintain neg calorie balance > defer f/u primary care including intermittently monitoring thyroid status             Each maintenance medication was reviewed in detail including emphasizing most importantly the difference between maintenance and prns and under what circumstances the prns are to be triggered using an action plan format where appropriate.  Total time for H and P, chart review, counseling, reviewing hfa/02/pulse ox  device(s) and generating customized AVS unique to this office visit / same day charting = 30 min

## 2023-05-21 NOTE — Assessment & Plan Note (Signed)
11/19/2017  sats 81% at rest in a chronic stable state so rec 2lpm 24/7  - HC03  11/04/18 = 33  - 08/03/2019 POC trial could not maintain sats at > 88% on up to 5lpm POC  - HC03   06/22/20  = 31  -   08/11/2020   Walked 3lpm pulse   1.5  laps @ approx 263ft each @ moderate pace  stopped due to end of study sob no desats on 02  -  HC03   05/15/22 = 29  - 11/05/2022   Walked on RA  x  one  lap(s) =  approx 150  ft  @ moderately slow pace, stopped due to sob/ desats to 88% up to 95% on 4lpm but declined to continue to walk due to fatigue  - HC03    07/20/23  = 32   Well compensated chronic mild hypercarbic resp failure component/ hypoxemic component doing fine but reminded:  Make sure you check your oxygen saturation  AT  your highest level of activity (not after you stop)   to be sure it stays over 90% and adjust  02 flow upward to maintain this level if needed but remember to turn it back to previous settings when you stop (to conserve your supply).

## 2023-05-21 NOTE — Patient Instructions (Signed)
No change in my recommendations   Make sure you check your oxygen saturation  AT  your highest level of activity (not after you stop)   to be sure it stays over 90% and adjust  02 flow upward to maintain this level if needed but remember to turn it back to previous settings when you stop (to conserve your supply).    Please schedule a follow up visit in 12  months but call sooner if needed

## 2023-05-21 NOTE — Assessment & Plan Note (Signed)
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 - 05/17/2015  Walked RA  2 laps @ 185 ft each stopped due to  Sob/ desat at nl pace to 83%   - trial off acei 05/17/2015  - PFT's  06/28/2015  FEV1 1.67 (51 % ) ratio 55  p 13 % improvement from saba with DLCO  47 % corrects to 66 % for alv volume  - rec trial of symbicort 06/28/15 > coughing / breathing worse so stopped it   - 08/15/2015  Walked RA x 3 laps @ 185 ft each stopped due to End of study, nl pace, min sob/  desat  To 88%  - sats 81% at rest and not acutely ill 11/19/2017 > placed on 2lpm (see separate a/p)  - Spirometry 11/19/2017  FEV1 1.60 (49%)  Ratio 59 with classic curvature - 11/19/2017  After extensive coaching inhaler device  effectiveness =    90% with smi > trial of stiolto   PFT's  01/05/2018  FEV1 1.66 (52 % ) ratio 60  p 6 % improvement from saba p 6 prior to study with DLCO  38 % corrects to 56  % for alv volume   - 01/05/2018  Referred to rehab [facility] Anoro trial 02/02/2018 > not better so d/c'd > no worse doe off  it   Most of his doe is not related to copd and he is not prone to aecopd so no rx needed   >>>rec  keep saba on hand for prn use based on guidelines for group A COPD

## 2023-05-24 LAB — ANTI-NUCLEAR AB-TITER (ANA TITER): ANA Titer 1: 1:40 {titer} — ABNORMAL HIGH

## 2023-05-24 LAB — RHEUMATOID FACTOR: Rheumatoid fact SerPl-aCnc: <10 [IU]/mL (ref <14)

## 2023-05-24 LAB — ANA: Anti Nuclear Antibody (ANA): POSITIVE — AB

## 2023-05-27 NOTE — Progress Notes (Unsigned)
    Shernita Rabinovich T. Venie Montesinos, MD, CAQ Sports Medicine [facility] at [facility] [address] [place], [address]  Phone: [phone]  FAX: [phone]  Alfred Carney - 72 y.o. male  MRN [id number]  Date of Birth: Dec 30, 1950  Date: 05/28/2023  PCP: Mort Sawyers, FNP  Referral: Mort Sawyers, FNP  No chief complaint on file.  Subjective:   Alfred Carney is a 72 y.o. very pleasant male patient with There is no height or weight on file to calculate BMI. who presents with the following:  The patient presents with some ongoing elbow pain.    Review of Systems is noted in the HPI, as appropriate  Objective:   There were no vitals taken for this visit.  GEN: No acute distress; alert,appropriate. PULM: Breathing comfortably in no respiratory distress PSYCH: Normally interactive.   Laboratory and Imaging Data:  Assessment and Plan:   ***

## 2023-05-28 ENCOUNTER — Telehealth: Payer: Self-pay | Admitting: Family

## 2023-05-28 ENCOUNTER — Ambulatory Visit (INDEPENDENT_AMBULATORY_CARE_PROVIDER_SITE_OTHER): Payer: Medicare Other | Admitting: Family Medicine

## 2023-05-28 ENCOUNTER — Encounter: Payer: Self-pay | Admitting: Family Medicine

## 2023-05-28 VITALS — BP 114/64 | HR 66 | Temp 97.7°F | Ht 69.0 in | Wt 317.0 lb

## 2023-05-28 DIAGNOSIS — L409 Psoriasis, unspecified: Secondary | ICD-10-CM

## 2023-05-28 DIAGNOSIS — M7022 Olecranon bursitis, left elbow: Secondary | ICD-10-CM | POA: Diagnosis not present

## 2023-05-28 DIAGNOSIS — E1122 Type 2 diabetes mellitus with diabetic chronic kidney disease: Secondary | ICD-10-CM

## 2023-05-28 MED ORDER — CLOBETASOL PROPIONATE 0.05 % EX CREA
1.0000 | TOPICAL_CREAM | Freq: Two times a day (BID) | CUTANEOUS | 1 refills | Status: DC
Start: 2023-05-28 — End: 2024-03-23

## 2023-05-28 NOTE — Telephone Encounter (Signed)
Patient called to request a refill on one touch needles and test strips, patient said it had been a while since they were filled. Advised that [name] was not on pt med list but would send a message. Patient skd for this to be sent to preferred pharmacy, [facility]

## 2023-05-28 NOTE — Telephone Encounter (Signed)
Patient needs insulin needles sent in for him. I did not see any on his med list. He said that he use them every now and then.   [facility] - [place], [place] - [address] Phone: [phone]  Fax: [phone]

## 2023-05-28 NOTE — Telephone Encounter (Signed)
Duplicate message. 

## 2023-05-28 NOTE — Telephone Encounter (Signed)
A duplicate message was taken on the same matter.  Reginold Agent   05/28/23 12:31 PM Note Patient called to request a refill on one touch needles and test strips, patient said it had been a while since they were filled. Advised that [name] was not on pt med list but would send a message. Patient skd for this to be sent to preferred pharmacy, [facility]

## 2023-05-29 MED ORDER — LANCET DEVICE MISC: 1.0000 | Freq: Every day | 3 refills | Status: AC | PRN

## 2023-05-29 MED ORDER — GLUCOSE BLOOD VI STRP: ORAL_STRIP | 12 refills | Status: AC

## 2023-05-29 NOTE — Telephone Encounter (Signed)
Refill sent. Pt notified via mychart.

## 2023-05-31 ENCOUNTER — Other Ambulatory Visit: Payer: Self-pay | Admitting: Family

## 2023-05-31 DIAGNOSIS — I5032 Chronic diastolic (congestive) heart failure: Secondary | ICD-10-CM

## 2023-06-19 ENCOUNTER — Other Ambulatory Visit: Payer: Self-pay | Admitting: Family

## 2023-06-19 DIAGNOSIS — E1122 Type 2 diabetes mellitus with diabetic chronic kidney disease: Secondary | ICD-10-CM

## 2023-07-02 ENCOUNTER — Other Ambulatory Visit: Payer: Self-pay | Admitting: Family

## 2023-07-02 DIAGNOSIS — I5032 Chronic diastolic (congestive) heart failure: Secondary | ICD-10-CM

## 2023-07-08 ENCOUNTER — Other Ambulatory Visit: Payer: Self-pay | Admitting: Family

## 2023-07-08 DIAGNOSIS — M2550 Pain in unspecified joint: Secondary | ICD-10-CM

## 2023-07-24 ENCOUNTER — Other Ambulatory Visit: Payer: Self-pay | Admitting: Family

## 2023-07-24 DIAGNOSIS — N400 Enlarged prostate without lower urinary tract symptoms: Secondary | ICD-10-CM

## 2023-07-30 ENCOUNTER — Other Ambulatory Visit: Payer: Self-pay | Admitting: Family

## 2023-07-30 DIAGNOSIS — E1122 Type 2 diabetes mellitus with diabetic chronic kidney disease: Secondary | ICD-10-CM

## 2023-08-03 ENCOUNTER — Other Ambulatory Visit: Payer: Self-pay | Admitting: Family

## 2023-08-03 DIAGNOSIS — I5032 Chronic diastolic (congestive) heart failure: Secondary | ICD-10-CM

## 2023-08-07 LAB — HM DIABETES EYE EXAM

## 2023-08-25 NOTE — Progress Notes (Signed)
noted 

## 2023-09-07 ENCOUNTER — Other Ambulatory Visit: Payer: Self-pay | Admitting: Family

## 2023-09-07 DIAGNOSIS — M2550 Pain in unspecified joint: Secondary | ICD-10-CM

## 2023-09-12 ENCOUNTER — Other Ambulatory Visit: Payer: Self-pay | Admitting: Family

## 2023-09-12 DIAGNOSIS — E1122 Type 2 diabetes mellitus with diabetic chronic kidney disease: Secondary | ICD-10-CM

## 2023-10-02 ENCOUNTER — Other Ambulatory Visit: Payer: Self-pay | Admitting: Family

## 2023-10-02 DIAGNOSIS — I10 Essential (primary) hypertension: Secondary | ICD-10-CM

## 2023-10-06 ENCOUNTER — Other Ambulatory Visit: Payer: Self-pay | Admitting: Family

## 2023-10-06 DIAGNOSIS — M2550 Pain in unspecified joint: Secondary | ICD-10-CM

## 2023-10-22 ENCOUNTER — Other Ambulatory Visit: Payer: Self-pay | Admitting: Family

## 2023-10-22 DIAGNOSIS — E1122 Type 2 diabetes mellitus with diabetic chronic kidney disease: Secondary | ICD-10-CM

## 2023-10-27 ENCOUNTER — Other Ambulatory Visit: Payer: Self-pay | Admitting: Family

## 2023-10-27 DIAGNOSIS — M2550 Pain in unspecified joint: Secondary | ICD-10-CM

## 2023-10-30 ENCOUNTER — Other Ambulatory Visit: Payer: Self-pay | Admitting: Family

## 2023-10-30 DIAGNOSIS — M2550 Pain in unspecified joint: Secondary | ICD-10-CM

## 2023-11-18 ENCOUNTER — Encounter: Payer: Self-pay | Admitting: Family

## 2023-11-18 ENCOUNTER — Telehealth: Payer: Medicare Other | Admitting: Family

## 2023-11-18 VITALS — BP 138/88 | HR 94 | Temp 98.1°F | Ht 69.0 in | Wt 322.4 lb

## 2023-11-18 DIAGNOSIS — E7800 Pure hypercholesterolemia, unspecified: Secondary | ICD-10-CM | POA: Diagnosis not present

## 2023-11-18 DIAGNOSIS — E559 Vitamin D deficiency, unspecified: Secondary | ICD-10-CM

## 2023-11-18 DIAGNOSIS — Z6841 Body Mass Index (BMI) 40.0 and over, adult: Secondary | ICD-10-CM

## 2023-11-18 DIAGNOSIS — E66813 Obesity, class 3: Secondary | ICD-10-CM

## 2023-11-18 DIAGNOSIS — E1169 Type 2 diabetes mellitus with other specified complication: Secondary | ICD-10-CM | POA: Diagnosis not present

## 2023-11-18 DIAGNOSIS — N182 Chronic kidney disease, stage 2 (mild): Secondary | ICD-10-CM

## 2023-11-18 DIAGNOSIS — Z7984 Long term (current) use of oral hypoglycemic drugs: Secondary | ICD-10-CM | POA: Diagnosis not present

## 2023-11-18 DIAGNOSIS — I872 Venous insufficiency (chronic) (peripheral): Secondary | ICD-10-CM

## 2023-11-18 DIAGNOSIS — I152 Hypertension secondary to endocrine disorders: Secondary | ICD-10-CM

## 2023-11-18 DIAGNOSIS — E785 Hyperlipidemia, unspecified: Secondary | ICD-10-CM

## 2023-11-18 DIAGNOSIS — E1122 Type 2 diabetes mellitus with diabetic chronic kidney disease: Secondary | ICD-10-CM

## 2023-11-18 DIAGNOSIS — E1159 Type 2 diabetes mellitus with other circulatory complications: Secondary | ICD-10-CM

## 2023-11-18 LAB — COMPREHENSIVE METABOLIC PANEL
ALT: 29 U/L (ref 0–53)
AST: 18 U/L (ref 0–37)
Albumin: 4 g/dL (ref 3.5–5.2)
Alkaline Phosphatase: 77 U/L (ref 39–117)
BUN: 20 mg/dL (ref 6–23)
CO2: 31 meq/L (ref 19–32)
Calcium: 9.2 mg/dL (ref 8.4–10.5)
Chloride: 103 meq/L (ref 96–112)
Creatinine, Ser: 1.37 mg/dL (ref 0.40–1.50)
GFR: 51.03 mL/min — ABNORMAL LOW (ref >60.00)
Glucose, Bld: 167 mg/dL — ABNORMAL HIGH (ref 70–99)
Potassium: 3.9 meq/L (ref 3.5–5.1)
Sodium: 142 meq/L (ref 135–145)
Total Bilirubin: 0.7 mg/dL (ref 0.2–1.2)
Total Protein: 7 g/dL (ref 6.0–8.3)

## 2023-11-18 LAB — LIPID PANEL
Cholesterol: 126 mg/dL (ref 0–200)
HDL: 38.9 mg/dL — ABNORMAL LOW (ref >39.00)
LDL Cholesterol: 67 mg/dL (ref 0–99)
NonHDL: 87.42
Total CHOL/HDL Ratio: 3
Triglycerides: 103 mg/dL (ref 0.0–149.0)
VLDL: 20.6 mg/dL (ref 0.0–40.0)

## 2023-11-18 LAB — MICROALBUMIN / CREATININE URINE RATIO
Creatinine,U: 52.5 mg/dL
Microalb Creat Ratio: 14.1 mg/g (ref 0.0–30.0)
Microalb, Ur: 0.7 mg/dL (ref 0.0–1.9)

## 2023-11-18 LAB — HEMOGLOBIN A1C: Hgb A1c MFr Bld: 7 % — ABNORMAL HIGH (ref 4.6–6.5)

## 2023-11-18 LAB — CBC
HCT: 44.7 % (ref 39.0–52.0)
Hemoglobin: 14.7 g/dL (ref 13.0–17.0)
MCHC: 32.8 g/dL (ref 30.0–36.0)
MCV: 91.5 fl (ref 78.0–100.0)
Platelets: 200 10*3/uL (ref 150.0–400.0)
RBC: 4.88 Mil/uL (ref 4.22–5.81)
RDW: 16.1 % — ABNORMAL HIGH (ref 11.5–15.5)
WBC: 7.1 10*3/uL (ref 4.0–10.5)

## 2023-11-18 NOTE — Progress Notes (Unsigned)
 Virtual Visit via Video note  I connected with Alfred Carney on 11/20/23 at the office by video and verified that I am speaking with the correct person using two identifiers.The provider, Mort Sawyers, FNP is located in their home at time of visit.  I discussed the limitations, risks, security and privacy concerns of performing an evaluation and management service by video and the availability of in person appointments. I also discussed with the patient that there may be a patient responsible charge related to this service. The patient expressed understanding and agreed to proceed.  Subjective: PCP: Mort Sawyers, FNP  Chief Complaint  Patient presents with   Medical Management of Chronic Issues    HPI  Left carotid stenosis and PAD, followed by vascular.  COPD with oxygen therapy, followed strictly by pulmonary about every 6 months.  Up-to-date  Joint pains: At last visit was given trial of meloxicam 7.5 mg once daily.  Type 2 diabetes: he is on glipizide 5 mg twice daily he will get dizzy and then get something sweet and usually feels better.  Eye exam Lab Results  Component Value Date   HGBA1C 7.0 (H) 11/18/2023   Colonoscopy: Was supposed to repeat in 2020 was advised last visit to follow-up on this.he does not want referral and does not want to schedule this despite risks discussed.   Mixed hyperlipidemia: September's lab work showed elevation of cholesterol so was increased pravastatin to 40 mg once daily  Arthritis is bothering in his left thumb, at times gets stuck at the distal knuckle and he has to manually having to move it back. This has been ongoing for the last 2-3 weeks. He does try to keep it warm when able. He does have some fingerless compression gloves that also help at times. Doesn't feel this is similar to gout.      ROS: Per HPI  Current Outpatient Medications:    albuterol (VENTOLIN HFA) 108 (90 Base) MCG/ACT inhaler, Inhale 2 puffs into the lungs  every 4 (four) hours as needed for wheezing or shortness of breath., Disp: 8 g, Rfl: 2   allopurinol (ZYLOPRIM) 100 MG tablet, Take 2 tablets (200 mg total) by mouth daily., Disp: 180 tablet, Rfl: 3   Calcium Carbonate-Simethicone 750-80 MG CHEW, Chew 1 tablet by mouth as needed (gas). , Disp: , Rfl:    Cholecalciferol (VITAMIN D PO), Take 1,000 Units by mouth daily., Disp: , Rfl:    clobetasol cream (TEMOVATE) 0.05 %, Apply 1 Application topically 2 (two) times daily., Disp: 60 g, Rfl: 1   furosemide (LASIX) 20 MG tablet, Take 2 tablets by mouth once daily, Disp: 60 tablet, Rfl: 0   glipiZIDE (GLUCOTROL) 5 MG tablet, TAKE 1 TABLET BY MOUTH TWICE DAILY BEFORE A MEAL, Disp: 90 tablet, Rfl: 0   glucose blood test strip, Use as instructed, Disp: 100 each, Rfl: 12   ibuprofen (ADVIL) 200 MG tablet, Take 200 mg by mouth every 6 (six) hours as needed., Disp: , Rfl:    irbesartan (AVAPRO) 300 MG tablet, Take 1 tablet by mouth once daily, Disp: 90 tablet, Rfl: 0   ketoconazole (NIZORAL) 2 % shampoo, Apply topically to affected area once daily as needed for irritation, Disp: 120 mL, Rfl: 2   Lancet Device MISC, 1 Lancet by Does not apply route daily as needed., Disp: 100 each, Rfl: 3   meloxicam (MOBIC) 7.5 MG tablet, Take 1 tablet by mouth once daily, Disp: 30 tablet, Rfl: 0   metoprolol succinate (  TOPROL-XL) 25 MG 24 hr tablet, Take 1 tablet by mouth once daily, Disp: 90 tablet, Rfl: 0   nitroGLYCERIN (NITROSTAT) 0.4 MG SL tablet, Place 1 tablet (0.4 mg total) under the tongue every 5 (five) minutes x 3 doses as needed for chest pain., Disp: 25 tablet, Rfl: 0   OXYGEN, Inhale 2 L into the lungs continuous. , Disp: , Rfl:    pravastatin (PRAVACHOL) 40 MG tablet, Take 1 tablet (40 mg total) by mouth daily., Disp: 90 tablet, Rfl: 3   tacrolimus (PROTOPIC) 0.1 % ointment, Apply topically 2 (two) times daily. 2 weeks, Disp: , Rfl:    tamsulosin (FLOMAX) 0.4 MG CAPS capsule, Take 1 capsule by mouth at bedtime,  Disp: 90 capsule, Rfl: 1  Observations/Objective: Physical Exam Constitutional:      General: He is not in acute distress.    Appearance: Normal appearance. He is normal weight. He is not ill-appearing, toxic-appearing or diaphoretic.  Cardiovascular:     Rate and Rhythm: Normal rate.  Pulmonary:     Effort: Pulmonary effort is normal.  Musculoskeletal:        General: Normal range of motion.  Neurological:     General: No focal deficit present.     Mental Status: He is alert and oriented to person, place, and time. Mental status is at baseline.  Psychiatric:        Mood and Affect: Mood normal.        Behavior: Behavior normal.        Thought Content: Thought content normal.        Judgment: Judgment normal.     Assessment and Plan: Type 2 diabetes mellitus with stage 2 chronic kidney disease, without long-term current use of insulin (HCC) Assessment & Plan: A1c today pending results.  Urine m/a ordered as well.  Continue glipizide 5 mg twice daily    Orders: -     Comprehensive metabolic panel; Future -     CBC; Future -     Hemoglobin A1c; Future -     Microalbumin / creatinine urine ratio; Future  Hyperlipidemia associated with type 2 diabetes mellitus (HCC) Assessment & Plan: Ordered lipid panel, pending results. Work on low cholesterol diet and exercise as tolerated    Pure hypercholesterolemia -     Lipid panel; Future  Vitamin D deficiency Assessment & Plan: Ordered vitamin d pending results.     Class 3 severe obesity due to excess calories with serious comorbidity and body mass index (BMI) of 45.0 to 49.9 in adult [facility]) Assessment & Plan: Pt advised to work on diet and exercise as tolerated    Hypertension associated with diabetes [facility]) Assessment & Plan: Stable today in office.     Venous stasis dermatitis of both lower extremities    Follow Up Instructions: Return in about 6 months (around 05/20/2024) for f/u CPE.   I discussed the  assessment and treatment plan with the patient. The patient was provided an opportunity to ask questions and all were answered. The patient agreed with the plan and demonstrated an understanding of the instructions.   The patient was advised to call back or seek an in-person evaluation if the symptoms worsen or if the condition fails to improve as anticipated.  The above assessment and management plan was discussed with the patient. The patient verbalized understanding of and has agreed to the management plan. Patient is aware to call the clinic if symptoms persist or worsen. Patient is aware when to return  to the clinic for a follow-up visit. Patient educated on when it is appropriate to go to the emergency department.     Mort Sawyers, MSN, APRN, FNP-C [facility]

## 2023-11-19 ENCOUNTER — Encounter: Payer: Self-pay | Admitting: Family

## 2023-11-20 NOTE — Assessment & Plan Note (Signed)
 A1c today pending results.  Urine m/a ordered as well.  Continue glipizide 5 mg twice daily

## 2023-11-20 NOTE — Assessment & Plan Note (Signed)
 Ordered lipid panel, pending results. Work on low cholesterol diet and exercise as tolerated

## 2023-11-20 NOTE — Assessment & Plan Note (Signed)
Stable today in office.

## 2023-11-20 NOTE — Assessment & Plan Note (Signed)
 Ordered vitamin d pending results.

## 2023-11-20 NOTE — Assessment & Plan Note (Signed)
 Pt advised to work on diet and exercise as tolerated

## 2023-12-01 ENCOUNTER — Other Ambulatory Visit: Payer: Self-pay | Admitting: Family

## 2023-12-01 DIAGNOSIS — E1122 Type 2 diabetes mellitus with diabetic chronic kidney disease: Secondary | ICD-10-CM

## 2023-12-01 DIAGNOSIS — M2550 Pain in unspecified joint: Secondary | ICD-10-CM

## 2023-12-02 ENCOUNTER — Other Ambulatory Visit: Payer: Self-pay

## 2023-12-02 DIAGNOSIS — Z8679 Personal history of other diseases of the circulatory system: Secondary | ICD-10-CM

## 2023-12-11 ENCOUNTER — Encounter: Payer: Self-pay | Admitting: Physician Assistant

## 2023-12-11 ENCOUNTER — Ambulatory Visit: Payer: Medicare Other | Admitting: Physician Assistant

## 2023-12-11 ENCOUNTER — Ambulatory Visit (HOSPITAL_COMMUNITY)
Admission: RE | Admit: 2023-12-11 | Discharge: 2023-12-11 | Disposition: A | Discharge status: Home / Self Care | Payer: Medicare Other | Source: Ambulatory Visit | Attending: Vascular Surgery | Admitting: Vascular Surgery

## 2023-12-11 VITALS — BP 114/54 | HR 64 | Temp 98.2°F | Ht 69.0 in | Wt 324.2 lb

## 2023-12-11 DIAGNOSIS — Z8679 Personal history of other diseases of the circulatory system: Secondary | ICD-10-CM | POA: Diagnosis present

## 2023-12-11 NOTE — Progress Notes (Deleted)
 Office Note   History of Present Illness   Pietro Bonura is a 73 y.o. (18-Jan-1951) male who presents for surveillance of carotid artery stenosis.   The patient returns today for follow up. He/She denies any recent CVA or TIA diagnosis. The patient also denies any recent strokelike symptoms such as slurred speech, facial droop, sudden visual changes, or sudden weakness/numbness.  Current Outpatient Medications  Medication Sig Dispense Refill   albuterol (VENTOLIN HFA) 108 (90 Base) MCG/ACT inhaler Inhale 2 puffs into the lungs every 4 (four) hours as needed for wheezing or shortness of breath. 8 g 2   allopurinol (ZYLOPRIM) 100 MG tablet Take 2 tablets (200 mg total) by mouth daily. 180 tablet 3   Calcium Carbonate-Simethicone 750-80 MG CHEW Chew 1 tablet by mouth as needed (gas).      Cholecalciferol (VITAMIN D PO) Take 1,000 Units by mouth daily.     clobetasol cream (TEMOVATE) 0.05 % Apply 1 Application topically 2 (two) times daily. 60 g 1   furosemide (LASIX) 20 MG tablet Take 2 tablets by mouth once daily 60 tablet 0   glipiZIDE (GLUCOTROL) 5 MG tablet TAKE 1 TABLET BY MOUTH TWICE DAILY BEFORE A MEAL 180 tablet 3   glucose blood test strip Use as instructed 100 each 12   ibuprofen (ADVIL) 200 MG tablet Take 200 mg by mouth every 6 (six) hours as needed.     irbesartan (AVAPRO) 300 MG tablet Take 1 tablet by mouth once daily 90 tablet 0   ketoconazole (NIZORAL) 2 % shampoo Apply topically to affected area once daily as needed for irritation 120 mL 2   Lancet Device MISC 1 Lancet by Does not apply route daily as needed. 100 each 3   meloxicam (MOBIC) 7.5 MG tablet Take 1 tablet by mouth once daily 90 tablet 3   metoprolol succinate (TOPROL-XL) 25 MG 24 hr tablet Take 1 tablet by mouth once daily 90 tablet 0   nitroGLYCERIN (NITROSTAT) 0.4 MG SL tablet Place 1 tablet (0.4 mg total) under the tongue every 5 (five) minutes x 3 doses as needed for chest pain. 25 tablet 0   OXYGEN Inhale 2  L into the lungs continuous.      pravastatin (PRAVACHOL) 40 MG tablet Take 1 tablet (40 mg total) by mouth daily. 90 tablet 3   tacrolimus (PROTOPIC) 0.1 % ointment Apply topically 2 (two) times daily. 2 weeks     tamsulosin (FLOMAX) 0.4 MG CAPS capsule Take 1 capsule by mouth at bedtime 90 capsule 1   No current facility-administered medications for this visit.    ***REVIEW OF SYSTEMS (negative unless checked):   Cardiac:  []  Chest pain or chest pressure? []  Shortness of breath upon activity? []  Shortness of breath when lying flat? []  Irregular heart rhythm?  Vascular:  []  Pain in calf, thigh, or hip brought on by walking? []  Pain in feet at night that wakes you up from your sleep? []  Blood clot in your veins? []  Leg swelling?  Pulmonary:  []  Oxygen at home? []  Productive cough? []  Wheezing?  Neurologic:  []  Sudden weakness in arms or legs? []  Sudden numbness in arms or legs? []  Sudden onset of difficult speaking or slurred speech? []  Temporary loss of vision in one eye? []  Problems with dizziness?  Gastrointestinal:  []  Blood in stool? []  Vomited blood?  Genitourinary:  []  Burning when urinating? []  Blood in urine?  Psychiatric:  []  Major depression  Hematologic:  []  Bleeding problems? []   Problems with blood clotting?  Dermatologic:  []  Rashes or ulcers?  Constitutional:  []  Fever or chills?  Ear/Nose/Throat:  []  Change in hearing? []  Nose bleeds? []  Sore throat?  Musculoskeletal:  []  Back pain? []  Joint pain? []  Muscle pain?   Physical Examination  ***There were no vitals filed for this visit. ***There is no height or weight on file to calculate BMI.  General:  WDWN in NAD; vital signs documented above Gait: Not observed HENT: WNL, normocephalic Pulmonary: normal non-labored breathing , without rales, rhonchi,  wheezing Cardiac: {Desc; regular/irreg:14544} HR, without murmurs {With/Without:20273} carotid bruit*** Abdomen: soft, NT, no  masses Skin: {With/Without:20273} rashes Vascular Exam/Pulses: palpable radial pulses bilaterally Extremities: {With/Without:20273} ischemic changes, {With/Without:20273} gangrene , {With/Without:20273} cellulitis; {With/Without:20273} open wounds;  Musculoskeletal: no muscle wasting or atrophy  Neurologic: A&O X 3;  No focal weakness or paresthesias are detected Psychiatric:  The pt has {Desc; normal/abnormal:11317::"Normal"} affect.  Non-Invasive Vascular Imaging   Bilateral Carotid Duplex (***):  R ICA stenosis:  {Stenosis:19197::"Occluded","80-99%","60-79%","40-59%","1-39%"} R VA: *** patent and antegrade L ICA stenosis:  {Stenosis:19197::"Occluded","80-99%","60-79%","40-59%","1-39%"} L VA: *** patent and antegrade   Medical Decision Making   Attilio Zeitler is a 73 y.o. male who presents for surveillance of carotid artery stenosis  Based on the patient's vascular studies, their carotid artery stenosis is *** She/he denies any strokelike symptoms such as slurred speech, facial droop, sudden visual changes, or sudden weakness/numbness. No recent diagnosis of CVA or TIA. She/he has no neuro deficits on exam. There are palpable and equal radial pulses bilaterally They will continue their *** and follow up with our office in *** months/year with carotid duplex   Loel Dubonnet PA-C [facility] Office: [phone]  Clinic MD: ***

## 2023-12-11 NOTE — Progress Notes (Addendum)
 HISTORY AND PHYSICAL     CC:  follow up. Requesting Provider:  Corwin Antu, FNP  HPI: This is a 73 y.o. male here for follow up for carotid artery stenosis.    S/p left thoracotomy for aorta-right femoral-left EIA bypass with implantation of two right renal arteries and one left renal artery on 03/15/2003 by Dr. Eliza and Dr. Dyane.   He has hx of lymphedema and PAD.    Pt was last seen 12/04/2022 and at that time his left carotid artery stenosis was stable in the 40-59% range.  He remained asymptomatic.   Pt returns today for follow up.    Pt denies any amaurosis fugax, speech difficulties, weakness, numbness, paralysis or clumsiness or facial droop.    He denies any rest pain in his feet or non healing wounds.  His walking is limited by his breathing.  He is on continuous O2.    He states he is hard of hearing.    The pt is on a statin for cholesterol management.  The pt is on a daily aspirin .   Other AC:  none The pt is on BB, ARB for hypertension.   The pt is  on medication for diabetes Tobacco hx:  former   Past Medical History:  Diagnosis Date   Chronic obstructive pulmonary disease (HCC) 2008   Moderate   Colon polyps    Community acquired pneumonia    Coronary artery disease    Degenerative joint disease    Diabetes mellitus without complication (HCC)    type 2   Emphysema lung (HCC)    Enlarged liver    fatty liver by '09 CT   Gastroesophageal reflux disease    Gout    H/O hiatal hernia    History of [place] spotted fever    Possible history of [place] Spotted Fever   Hyperlipidemia    Hypertension    Hypokalemia    diuretic induced, resolved   Microcytic anemia    iron pills   Morbid obesity (HCC)    Shortness of breath    Skin cancer    skin cancer lip removed   Sleep apnea    not currently using CPAP 05/20/13   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 LEVEL  10/23/2018       ESOPHAGOGASTRODUODENOSCOPY (EGD) WITH PROPOFOL  N/A 01/22/2018   Procedure: ESOPHAGOGASTRODUODENOSCOPY (EGD) WITH PROPOFOL ;  Surgeon: Shila Gustav GAILS, MD;  Location: [facility];  Service: Endoscopy;  Laterality: N/A;   HARDWARE REMOVAL Left 05/26/2013   Procedure: HARDWARE REMOVAL;  Surgeon: Jerona GAILS Sage, MD;  Location: [facility] OR;  Service: Orthopedics;  Laterality: Left;  Left Total Hip Arthroplasty, Removal of Deep Hardware   HIP SURGERY     Status post left hip surgery with bone grafting   IR INJECT/THERA/INC NEEDLE/CATH/PLC EPI/LUMB/SAC W/IMG  10/23/2018   LEFT HEART CATHETERIZATION WITH CORONARY ANGIOGRAM N/A 01/31/2014   Procedure: LEFT HEART CATHETERIZATION WITH CORONARY ANGIOGRAM;  Surgeon: Lonni JONETTA Cash, MD;  [facility] C[facility] LAB;  Service: Cardiovascular;  Laterality: N/A;   LUMBAR LAMINECTOMY/DECOMPRESSION  MICRODISCECTOMY Left 10/28/2018   Procedure: Left Lumbar three-four Extraforaminal microdiscectomy;  Surgeon: Alix Charleston, MD;  Location: [facility] OR;  Service: Neurosurgery;  Laterality: Left;   THORACOABDOMINAL AORTIC ANEURYSM REPAIR     with right femoral and left iliac BPG and reimplantation of renal arteries.   TONSILLECTOMY     TONSILLECTOMY     TOTAL HIP ARTHROPLASTY Left 05/26/2013   Procedure: TOTAL HIP ARTHROPLASTY;  Surgeon: Jerona LULLA Sage, MD;  Location: [facility] OR;  Service: Orthopedics;  Laterality: Left;  Left Total Hip Arthroplasty, Removal Deep Hardware    No Known Allergies  Current Outpatient Medications  Medication Sig Dispense Refill   albuterol  (VENTOLIN  HFA) 108 (90 Base) MCG/ACT inhaler Inhale 2 puffs  into the lungs every 4 (four) hours as needed for wheezing or shortness of breath. 8 g 2   allopurinol  (ZYLOPRIM ) 100 MG tablet Take 2 tablets (200 mg total) by mouth daily. 180 tablet 3   Calcium  Carbonate-Simethicone  750-80 MG CHEW Chew 1 tablet by mouth as needed (gas).      Cholecalciferol  (VITAMIN D  PO) Take 1,000 Units by mouth daily.     clobetasol  cream (TEMOVATE ) 0.05 % Apply 1 Application topically 2 (two) times daily. 60 g 1   furosemide  (LASIX ) 20 MG tablet Take 2 tablets by mouth once daily 60 tablet 0   glipiZIDE  (GLUCOTROL ) 5 MG tablet TAKE 1 TABLET BY MOUTH TWICE DAILY BEFORE A MEAL 180 tablet 3   glucose blood test strip Use as instructed 100 each 12   ibuprofen  (ADVIL ) 200 MG tablet Take 200 mg by mouth every 6 (six) hours as needed.     irbesartan  (AVAPRO ) 300 MG tablet Take 1 tablet by mouth once daily 90 tablet 0   ketoconazole  (NIZORAL ) 2 % shampoo Apply topically to affected area once daily as needed for irritation 120 mL 2   Lancet Device MISC 1 Lancet by Does not apply route daily as needed. 100 each 3   meloxicam  (MOBIC ) 7.5 MG tablet Take 1 tablet by mouth once daily 90 tablet 3   metoprolol  succinate (TOPROL -XL) 25 MG 24 hr tablet Take 1 tablet by mouth once daily 90 tablet 0   nitroGLYCERIN  (NITROSTAT ) 0.4 MG SL tablet Place 1 tablet (0.4 mg total) under the tongue every 5 (five) minutes x 3 doses as needed for chest pain. 25 tablet 0   OXYGEN  Inhale 2 L into the lungs continuous.      pravastatin  (PRAVACHOL ) 40 MG tablet Take 1 tablet (40 mg total) by mouth daily. 90 tablet 3   tacrolimus (PROTOPIC) 0.1 % ointment Apply topically 2 (two) times daily. 2 weeks     tamsulosin  (FLOMAX ) 0.4 MG CAPS capsule Take 1 capsule by mouth at bedtime 90 capsule 1   No current facility-administered medications for this visit.    Family History  Problem Relation Age of Onset   Osteoarthritis Mother    Diabetes Mother    Pulmonary embolism Mother    Heart disease Father         Coronary Artery Disease   Stroke Father    Multiple sclerosis Sister    Factor V Leiden deficiency Sister    Emphysema Sister    Hyperlipidemia Brother    Heart attack Maternal Grandmother    Lung cancer Maternal Grandfather        smoked    Social History   Socioeconomic History   Marital status: Married    Spouse name: [name]   Number of children: 4  Years of education: GED, ITT   Highest education level: Associate degree: occupational, scientist, product/process development, or vocational program  Occupational History   Occupation: architectural drawing-retired  Tobacco Use   Smoking status: Former    Current packs/day: 0.00    Average packs/day: 1 pack/day for 35.0 years (35.0 ttl pk-yrs)    Types: Cigarettes    Start date: 09/17/1971    Quit date: 09/16/2006    Years since quitting: 17.2   Smokeless tobacco: Never  Vaping Use   Vaping status: Never Used  Substance and Sexual Activity   Alcohol use: No    Alcohol/week: 0.0 standard drinks of alcohol   Drug use: No   Sexual activity: Not Currently  Other Topics Concern   Not on file  Social History Narrative   Lives with wife   Right Handed   Drinks 4-5 cups caffeine daily   Social Drivers of Health   Financial Resource Strain: Low Risk  (05/27/2023)   Overall Financial Resource Strain (CARDIA)    Difficulty of Paying Living Expenses: Not hard at all  Food Insecurity: No Food Insecurity (05/27/2023)   Hunger Vital Sign    Worried About Running Out of Food in the Last Year: Never true    Ran Out of Food in the Last Year: Never true  Transportation Needs: No Transportation Needs (05/27/2023)   PRAPARE - Administrator, Civil Service (Medical): No    Lack of Transportation (Non-Medical): No  Physical Activity: Insufficiently Active (05/27/2023)   Exercise Vital Sign    Days of Exercise per Week: 1 day    Minutes of Exercise per Session: 10 min  Stress: No Stress Concern Present (05/27/2023)   [employer] of Occupational  Health - Occupational Stress Questionnaire    Feeling of Stress : Not at all  Social Connections: Socially Integrated (05/27/2023)   Social Connection and Isolation Panel [NHANES]    Frequency of Communication with Friends and Family: More than three times a week    Frequency of Social Gatherings with Friends and Family: Never    Attends Religious Services: 1 to 4 times per year    Active Member of [employer] or Organizations: Yes    Attends Banker Meetings: 1 to 4 times per year    Marital Status: Married  Catering Manager Violence: Not At Risk (05/12/2023)   Humiliation, Afraid, Rape, and Kick questionnaire    Fear of Current or Ex-Partner: No    Emotionally Abused: No    Physically Abused: No    Sexually Abused: No     REVIEW OF SYSTEMS:   [X]  denotes positive finding, [ ]  denotes negative finding Cardiac  Comments:  Chest pain or chest pressure:    Shortness of breath upon exertion:    Short of breath when lying flat:    Irregular heart rhythm:        Vascular    Pain in calf, thigh, or hip brought on by ambulation:    Pain in feet at night that wakes you up from your sleep:     Blood clot in your veins:    Leg swelling:  x       Pulmonary    Oxygen  at home: x   Productive cough:     Wheezing:         Neurologic    Sudden weakness in arms or legs:     Sudden numbness in arms or legs:     Sudden onset of difficulty speaking  or slurred speech:    Temporary loss of vision in one eye:     Problems with dizziness:         Gastrointestinal    Blood in stool:     Vomited blood:         Genitourinary    Burning when urinating:     Blood in urine:        Psychiatric    Major depression:         Hematologic    Bleeding problems:    Problems with blood clotting too easily:        Skin    Rashes or ulcers:        Constitutional    Fever or chills:      PHYSICAL EXAMINATION:  Today's Vitals   12/11/23 1211 12/11/23 1213  BP: (!) 127/93 (!) 114/54   Pulse: 64   Temp: 98.2 F (36.8 C)   TempSrc: Temporal   SpO2: 92%   Weight: (!) 324 lb 3.2 oz (147.1 kg)   Height: 5' 9 (1.753 m)   PainSc: 4     Body mass index is 47.88 kg/m.   General:  WDWN in NAD; vital signs documented above Gait: Not observed HENT: WNL, normocephalic Pulmonary: normal non-labored breathing on ONC Cardiac: regular HR, without carotid bruits Abdomen: obese Skin: without rashes Vascular Exam/Pulses:  Right Left  Radial 2+ (normal) 2+ (normal)   Extremities: without open wounds; + BLE swelling Musculoskeletal: no muscle wasting or atrophy  Neurologic: A&O X 3; moving all extremities equally; speech is fluent/normal Psychiatric:  The pt has Normal affect.   Non-Invasive Vascular Imaging:   Carotid Duplex on 12/11/2023 Right:  1-39% ICA stenosis Left:  40-59% ICA stenosis   Previous Carotid duplex on 12/04/2022: Right: 1-39% ICA stenosis Left:   40-59% ICA stenosis    ASSESSMENT/PLAN:: 73 y.o. male here for follow up carotid artery stenosis   -duplex today reveals it is unchanged from last year and remains 40-59% on the left.  He remains asymptomatic.   -discussed s/s of stroke with pt and he understands should he develop any of these sx, he will go to the nearest ER or call 911. -pt will f/u in one year with carotid duplex -pt will call sooner should he have any issues. -continue statin/asa   -as far as his hx of TAA repair.  Discussed with him that his children should also be checked for AAA as pt was 73 years of age when he had his repair.    Lucie Apt, [facility] [phone]  Clinic MD:  [place]

## 2023-12-26 ENCOUNTER — Other Ambulatory Visit: Payer: Self-pay | Admitting: Family

## 2023-12-26 DIAGNOSIS — I10 Essential (primary) hypertension: Secondary | ICD-10-CM

## 2024-01-02 ENCOUNTER — Other Ambulatory Visit: Payer: Self-pay | Admitting: Family

## 2024-01-02 DIAGNOSIS — I5032 Chronic diastolic (congestive) heart failure: Secondary | ICD-10-CM

## 2024-01-30 ENCOUNTER — Other Ambulatory Visit: Payer: Self-pay | Admitting: Family

## 2024-01-30 DIAGNOSIS — I5032 Chronic diastolic (congestive) heart failure: Secondary | ICD-10-CM

## 2024-02-13 ENCOUNTER — Other Ambulatory Visit: Payer: Self-pay | Admitting: Family

## 2024-02-13 DIAGNOSIS — N400 Enlarged prostate without lower urinary tract symptoms: Secondary | ICD-10-CM

## 2024-02-27 ENCOUNTER — Other Ambulatory Visit: Payer: Self-pay | Admitting: Family

## 2024-02-27 DIAGNOSIS — I5032 Chronic diastolic (congestive) heart failure: Secondary | ICD-10-CM

## 2024-03-11 ENCOUNTER — Encounter: Payer: Self-pay | Admitting: Internal Medicine

## 2024-03-11 ENCOUNTER — Other Ambulatory Visit (HOSPITAL_COMMUNITY): Payer: Self-pay

## 2024-03-11 ENCOUNTER — Telehealth (HOSPITAL_COMMUNITY): Payer: Self-pay | Admitting: Pharmacy Technician

## 2024-03-11 ENCOUNTER — Emergency Department

## 2024-03-11 ENCOUNTER — Inpatient Hospital Stay
Admission: EM | Admit: 2024-03-11 | Discharge: 2024-03-23 | DRG: 308 | Disposition: A | Discharge status: Skilled Nursing Facility | Attending: Hospitalist | Admitting: Hospitalist

## 2024-03-11 ENCOUNTER — Other Ambulatory Visit: Payer: Self-pay

## 2024-03-11 DIAGNOSIS — R531 Weakness: Principal | ICD-10-CM

## 2024-03-11 DIAGNOSIS — J9611 Chronic respiratory failure with hypoxia: Secondary | ICD-10-CM | POA: Diagnosis present

## 2024-03-11 DIAGNOSIS — N138 Other obstructive and reflux uropathy: Secondary | ICD-10-CM | POA: Diagnosis present

## 2024-03-11 DIAGNOSIS — Z860100 Personal history of colon polyps, unspecified: Secondary | ICD-10-CM

## 2024-03-11 DIAGNOSIS — J9612 Chronic respiratory failure with hypercapnia: Secondary | ICD-10-CM | POA: Diagnosis present

## 2024-03-11 DIAGNOSIS — M109 Gout, unspecified: Secondary | ICD-10-CM | POA: Diagnosis present

## 2024-03-11 DIAGNOSIS — E785 Hyperlipidemia, unspecified: Secondary | ICD-10-CM | POA: Diagnosis present

## 2024-03-11 DIAGNOSIS — Z820 Family history of epilepsy and other diseases of the nervous system: Secondary | ICD-10-CM

## 2024-03-11 DIAGNOSIS — I471 Supraventricular tachycardia, unspecified: Secondary | ICD-10-CM | POA: Diagnosis not present

## 2024-03-11 DIAGNOSIS — M48061 Spinal stenosis, lumbar region without neurogenic claudication: Secondary | ICD-10-CM | POA: Diagnosis present

## 2024-03-11 DIAGNOSIS — E876 Hypokalemia: Secondary | ICD-10-CM | POA: Insufficient documentation

## 2024-03-11 DIAGNOSIS — Z91199 Patient's noncompliance with other medical treatment and regimen due to unspecified reason: Secondary | ICD-10-CM

## 2024-03-11 DIAGNOSIS — E1122 Type 2 diabetes mellitus with diabetic chronic kidney disease: Secondary | ICD-10-CM | POA: Diagnosis present

## 2024-03-11 DIAGNOSIS — I4891 Unspecified atrial fibrillation: Secondary | ICD-10-CM | POA: Insufficient documentation

## 2024-03-11 DIAGNOSIS — Z9981 Dependence on supplemental oxygen: Secondary | ICD-10-CM

## 2024-03-11 DIAGNOSIS — E1169 Type 2 diabetes mellitus with other specified complication: Secondary | ICD-10-CM | POA: Diagnosis present

## 2024-03-11 DIAGNOSIS — N390 Urinary tract infection, site not specified: Secondary | ICD-10-CM | POA: Diagnosis present

## 2024-03-11 DIAGNOSIS — I152 Hypertension secondary to endocrine disorders: Secondary | ICD-10-CM | POA: Diagnosis present

## 2024-03-11 DIAGNOSIS — Z833 Family history of diabetes mellitus: Secondary | ICD-10-CM

## 2024-03-11 DIAGNOSIS — E1151 Type 2 diabetes mellitus with diabetic peripheral angiopathy without gangrene: Secondary | ICD-10-CM | POA: Diagnosis present

## 2024-03-11 DIAGNOSIS — M25561 Pain in right knee: Secondary | ICD-10-CM | POA: Diagnosis not present

## 2024-03-11 DIAGNOSIS — Z96642 Presence of left artificial hip joint: Secondary | ICD-10-CM | POA: Diagnosis present

## 2024-03-11 DIAGNOSIS — Z823 Family history of stroke: Secondary | ICD-10-CM

## 2024-03-11 DIAGNOSIS — M25571 Pain in right ankle and joints of right foot: Secondary | ICD-10-CM | POA: Diagnosis present

## 2024-03-11 DIAGNOSIS — Z83438 Family history of other disorder of lipoprotein metabolism and other lipidemia: Secondary | ICD-10-CM

## 2024-03-11 DIAGNOSIS — Y92000 Kitchen of unspecified non-institutional (private) residence as  the place of occurrence of the external cause: Secondary | ICD-10-CM

## 2024-03-11 DIAGNOSIS — K219 Gastro-esophageal reflux disease without esophagitis: Secondary | ICD-10-CM | POA: Diagnosis present

## 2024-03-11 DIAGNOSIS — I2510 Atherosclerotic heart disease of native coronary artery without angina pectoris: Secondary | ICD-10-CM | POA: Diagnosis present

## 2024-03-11 DIAGNOSIS — J439 Emphysema, unspecified: Secondary | ICD-10-CM | POA: Diagnosis present

## 2024-03-11 DIAGNOSIS — I739 Peripheral vascular disease, unspecified: Secondary | ICD-10-CM | POA: Diagnosis present

## 2024-03-11 DIAGNOSIS — I6523 Occlusion and stenosis of bilateral carotid arteries: Secondary | ICD-10-CM | POA: Diagnosis present

## 2024-03-11 DIAGNOSIS — M5416 Radiculopathy, lumbar region: Secondary | ICD-10-CM | POA: Diagnosis not present

## 2024-03-11 DIAGNOSIS — W1839XA Other fall on same level, initial encounter: Secondary | ICD-10-CM | POA: Diagnosis present

## 2024-03-11 DIAGNOSIS — E875 Hyperkalemia: Secondary | ICD-10-CM | POA: Diagnosis not present

## 2024-03-11 DIAGNOSIS — I509 Heart failure, unspecified: Secondary | ICD-10-CM | POA: Diagnosis not present

## 2024-03-11 DIAGNOSIS — G4733 Obstructive sleep apnea (adult) (pediatric): Secondary | ICD-10-CM | POA: Diagnosis not present

## 2024-03-11 DIAGNOSIS — M4726 Other spondylosis with radiculopathy, lumbar region: Secondary | ICD-10-CM | POA: Diagnosis present

## 2024-03-11 DIAGNOSIS — I4719 Other supraventricular tachycardia: Secondary | ICD-10-CM | POA: Diagnosis present

## 2024-03-11 DIAGNOSIS — Z801 Family history of malignant neoplasm of trachea, bronchus and lung: Secondary | ICD-10-CM

## 2024-03-11 DIAGNOSIS — Z8701 Personal history of pneumonia (recurrent): Secondary | ICD-10-CM

## 2024-03-11 DIAGNOSIS — E1165 Type 2 diabetes mellitus with hyperglycemia: Secondary | ICD-10-CM | POA: Diagnosis present

## 2024-03-11 DIAGNOSIS — K760 Fatty (change of) liver, not elsewhere classified: Secondary | ICD-10-CM | POA: Diagnosis present

## 2024-03-11 DIAGNOSIS — Z87891 Personal history of nicotine dependence: Secondary | ICD-10-CM

## 2024-03-11 DIAGNOSIS — R0609 Other forms of dyspnea: Secondary | ICD-10-CM | POA: Diagnosis not present

## 2024-03-11 DIAGNOSIS — D72829 Elevated white blood cell count, unspecified: Secondary | ICD-10-CM | POA: Diagnosis present

## 2024-03-11 DIAGNOSIS — Z7901 Long term (current) use of anticoagulants: Secondary | ICD-10-CM

## 2024-03-11 DIAGNOSIS — M25572 Pain in left ankle and joints of left foot: Secondary | ICD-10-CM | POA: Diagnosis present

## 2024-03-11 DIAGNOSIS — M2578 Osteophyte, vertebrae: Secondary | ICD-10-CM | POA: Diagnosis present

## 2024-03-11 DIAGNOSIS — N401 Enlarged prostate with lower urinary tract symptoms: Secondary | ICD-10-CM | POA: Diagnosis present

## 2024-03-11 DIAGNOSIS — E1159 Type 2 diabetes mellitus with other circulatory complications: Secondary | ICD-10-CM | POA: Diagnosis present

## 2024-03-11 DIAGNOSIS — E662 Morbid (severe) obesity with alveolar hypoventilation: Secondary | ICD-10-CM | POA: Diagnosis present

## 2024-03-11 DIAGNOSIS — Z79899 Other long term (current) drug therapy: Secondary | ICD-10-CM

## 2024-03-11 DIAGNOSIS — Z832 Family history of diseases of the blood and blood-forming organs and certain disorders involving the immune mechanism: Secondary | ICD-10-CM

## 2024-03-11 DIAGNOSIS — T380X5A Adverse effect of glucocorticoids and synthetic analogues, initial encounter: Secondary | ICD-10-CM | POA: Diagnosis not present

## 2024-03-11 DIAGNOSIS — Z825 Family history of asthma and other chronic lower respiratory diseases: Secondary | ICD-10-CM

## 2024-03-11 DIAGNOSIS — N1831 Chronic kidney disease, stage 3a: Secondary | ICD-10-CM | POA: Diagnosis present

## 2024-03-11 DIAGNOSIS — R300 Dysuria: Secondary | ICD-10-CM | POA: Diagnosis present

## 2024-03-11 DIAGNOSIS — Z7984 Long term (current) use of oral hypoglycemic drugs: Secondary | ICD-10-CM

## 2024-03-11 DIAGNOSIS — M19079 Primary osteoarthritis, unspecified ankle and foot: Secondary | ICD-10-CM | POA: Diagnosis present

## 2024-03-11 DIAGNOSIS — R296 Repeated falls: Secondary | ICD-10-CM | POA: Diagnosis present

## 2024-03-11 DIAGNOSIS — Z8739 Personal history of other diseases of the musculoskeletal system and connective tissue: Secondary | ICD-10-CM

## 2024-03-11 DIAGNOSIS — Z8249 Family history of ischemic heart disease and other diseases of the circulatory system: Secondary | ICD-10-CM

## 2024-03-11 DIAGNOSIS — Z85828 Personal history of other malignant neoplasm of skin: Secondary | ICD-10-CM

## 2024-03-11 DIAGNOSIS — I5033 Acute on chronic diastolic (congestive) heart failure: Secondary | ICD-10-CM | POA: Diagnosis present

## 2024-03-11 DIAGNOSIS — I872 Venous insufficiency (chronic) (peripheral): Secondary | ICD-10-CM | POA: Diagnosis present

## 2024-03-11 DIAGNOSIS — Z791 Long term (current) use of non-steroidal anti-inflammatories (NSAID): Secondary | ICD-10-CM

## 2024-03-11 LAB — URINALYSIS, W/ REFLEX TO CULTURE (INFECTION SUSPECTED)
Bilirubin Urine: NEGATIVE
Glucose, UA: NEGATIVE mg/dL
Hgb urine dipstick: NEGATIVE
Ketones, ur: NEGATIVE mg/dL
Leukocytes,Ua: NEGATIVE
Nitrite: NEGATIVE
Protein, ur: 30 mg/dL — AB
Specific Gravity, Urine: 1.018 (ref 1.005–1.030)
pH: 5 (ref 5.0–8.0)

## 2024-03-11 LAB — TROPONIN I (HIGH SENSITIVITY)
Troponin I (High Sensitivity): 16 ng/L (ref <18)
Troponin I (High Sensitivity): 13 ng/L (ref <18)

## 2024-03-11 LAB — COMPREHENSIVE METABOLIC PANEL WITH GFR
ALT: 30 U/L (ref 0–44)
AST: 37 U/L (ref 15–41)
Albumin: 2.8 g/dL — ABNORMAL LOW (ref 3.5–5.0)
Alkaline Phosphatase: 89 U/L (ref 38–126)
Anion gap: 14 (ref 5–15)
BUN: 19 mg/dL (ref 8–23)
CO2: 30 mmol/L (ref 22–32)
Calcium: 8.9 mg/dL (ref 8.9–10.3)
Chloride: 94 mmol/L — ABNORMAL LOW (ref 98–111)
Creatinine, Ser: 1.43 mg/dL — ABNORMAL HIGH (ref 0.61–1.24)
GFR, Estimated: 51 mL/min — ABNORMAL LOW (ref >60)
Glucose, Bld: 121 mg/dL — ABNORMAL HIGH (ref 70–99)
Potassium: 3.1 mmol/L — ABNORMAL LOW (ref 3.5–5.1)
Sodium: 138 mmol/L (ref 135–145)
Total Bilirubin: 1.1 mg/dL (ref 0.0–1.2)
Total Protein: 7.2 g/dL (ref 6.5–8.1)

## 2024-03-11 LAB — BLOOD GAS, VENOUS
Acid-Base Excess: 7.6 mmol/L — ABNORMAL HIGH (ref 0.0–2.0)
Bicarbonate: 34.5 mmol/L — ABNORMAL HIGH (ref 20.0–28.0)
O2 Saturation: 61.1 %
Patient temperature: 37
pCO2, Ven: 57 mmHg (ref 44–60)
pH, Ven: 7.39 (ref 7.25–7.43)
pO2, Ven: 37 mmHg (ref 32–45)

## 2024-03-11 LAB — CBC WITH DIFFERENTIAL/PLATELET
Abs Immature Granulocytes: 0.16 10*3/uL — ABNORMAL HIGH (ref 0.00–0.07)
Basophils Absolute: 0.1 10*3/uL (ref 0.0–0.1)
Basophils Relative: 0 %
Eosinophils Absolute: 0 10*3/uL (ref 0.0–0.5)
Eosinophils Relative: 0 %
HCT: 42.9 % (ref 39.0–52.0)
Hemoglobin: 13.6 g/dL (ref 13.0–17.0)
Immature Granulocytes: 1 %
Lymphocytes Relative: 11 %
Lymphs Abs: 2 10*3/uL (ref 0.7–4.0)
MCH: 28.8 pg (ref 26.0–34.0)
MCHC: 31.7 g/dL (ref 30.0–36.0)
MCV: 90.7 fL (ref 80.0–100.0)
Monocytes Absolute: 1.4 10*3/uL — ABNORMAL HIGH (ref 0.1–1.0)
Monocytes Relative: 7 %
Neutro Abs: 15 10*3/uL — ABNORMAL HIGH (ref 1.7–7.7)
Neutrophils Relative %: 81 %
Platelets: 357 10*3/uL (ref 150–400)
RBC: 4.73 MIL/uL (ref 4.22–5.81)
RDW: 14.5 % (ref 11.5–15.5)
WBC: 18.7 10*3/uL — ABNORMAL HIGH (ref 4.0–10.5)
nRBC: 0 % (ref 0.0–0.2)

## 2024-03-11 LAB — LACTIC ACID, PLASMA
Lactic Acid, Venous: 1.3 mmol/L (ref 0.5–1.9)
Lactic Acid, Venous: 1.2 mmol/L (ref 0.5–1.9)

## 2024-03-11 LAB — CBG MONITORING, ED
Glucose-Capillary: 129 mg/dL — ABNORMAL HIGH (ref 70–99)
Glucose-Capillary: 237 mg/dL — ABNORMAL HIGH (ref 70–99)

## 2024-03-11 LAB — BRAIN NATRIURETIC PEPTIDE: B Natriuretic Peptide: 116.1 pg/mL — ABNORMAL HIGH (ref 0.0–100.0)

## 2024-03-11 LAB — TSH: TSH: 1.339 u[IU]/mL (ref 0.350–4.500)

## 2024-03-11 LAB — MAGNESIUM: Magnesium: 2.1 mg/dL (ref 1.7–2.4)

## 2024-03-11 MED ORDER — PRAVASTATIN SODIUM 20 MG PO TABS
40.0000 mg | ORAL_TABLET | Freq: Every day | ORAL | Status: DC
  Administered 2024-03-11 – 2024-03-22 (×12): 40 mg via ORAL
  Filled 2024-03-11: qty 2
  Filled 2024-03-11 (×5): qty 1
  Filled 2024-03-11 (×3): qty 2
  Filled 2024-03-11 (×2): qty 1
  Filled 2024-03-11: qty 2

## 2024-03-11 MED ORDER — SODIUM CHLORIDE 0.9 % IV SOLN
1.0000 g | Freq: Once | INTRAVENOUS | Status: AC
  Administered 2024-03-11: 1 g via INTRAVENOUS
  Filled 2024-03-11: qty 10

## 2024-03-11 MED ORDER — ONDANSETRON HCL 4 MG/2ML IJ SOLN: 4.0000 mg | Freq: Four times a day (QID) | INTRAMUSCULAR | Status: AC | PRN

## 2024-03-11 MED ORDER — HYDRALAZINE HCL 20 MG/ML IJ SOLN
5.0000 mg | Freq: Four times a day (QID) | INTRAMUSCULAR | Status: DC | PRN
  Filled 2024-03-11: qty 1

## 2024-03-11 MED ORDER — INSULIN ASPART 100 UNIT/ML IJ SOLN
0.0000 [IU] | Freq: Three times a day (TID) | INTRAMUSCULAR | Status: DC
  Administered 2024-03-11: 2 [IU] via SUBCUTANEOUS
  Administered 2024-03-12 – 2024-03-13 (×4): 3 [IU] via SUBCUTANEOUS
  Administered 2024-03-13: 2 [IU] via SUBCUTANEOUS
  Administered 2024-03-13 – 2024-03-14 (×2): 3 [IU] via SUBCUTANEOUS
  Administered 2024-03-14: 5 [IU] via SUBCUTANEOUS
  Administered 2024-03-14: 2 [IU] via SUBCUTANEOUS
  Administered 2024-03-15 – 2024-03-16 (×4): 3 [IU] via SUBCUTANEOUS
  Administered 2024-03-16: 2 [IU] via SUBCUTANEOUS
  Administered 2024-03-16: 5 [IU] via SUBCUTANEOUS
  Administered 2024-03-17 (×3): 3 [IU] via SUBCUTANEOUS
  Administered 2024-03-18: 5 [IU] via SUBCUTANEOUS
  Administered 2024-03-18: 3 [IU] via SUBCUTANEOUS
  Administered 2024-03-18: 5 [IU] via SUBCUTANEOUS
  Administered 2024-03-19: 8 [IU] via SUBCUTANEOUS
  Filled 2024-03-11 (×23): qty 1

## 2024-03-11 MED ORDER — METOPROLOL TARTRATE 5 MG/5ML IV SOLN
5.0000 mg | INTRAVENOUS | Status: DC | PRN
  Administered 2024-03-11: 5 mg via INTRAVENOUS
  Filled 2024-03-11: qty 5

## 2024-03-11 MED ORDER — TAMSULOSIN HCL 0.4 MG PO CAPS
0.4000 mg | ORAL_CAPSULE | Freq: Every day | ORAL | Status: DC
  Administered 2024-03-11 – 2024-03-22 (×12): 0.4 mg via ORAL
  Filled 2024-03-11 (×12): qty 1

## 2024-03-11 MED ORDER — FUROSEMIDE 10 MG/ML IJ SOLN
40.0000 mg | Freq: Once | INTRAMUSCULAR | Status: AC
  Administered 2024-03-11: 40 mg via INTRAVENOUS
  Filled 2024-03-11: qty 4

## 2024-03-11 MED ORDER — SIMETHICONE 80 MG PO CHEW: 80.0000 mg | CHEWABLE_TABLET | ORAL | Status: DC | PRN

## 2024-03-11 MED ORDER — ACETAMINOPHEN 650 MG RE SUPP: 650.0000 mg | Freq: Four times a day (QID) | RECTAL | Status: DC | PRN

## 2024-03-11 MED ORDER — VITAMIN D 25 MCG (1000 UNIT) PO TABS
1000.0000 [IU] | ORAL_TABLET | Freq: Every day | ORAL | Status: DC
  Administered 2024-03-12 – 2024-03-23 (×12): 1000 [IU] via ORAL
  Filled 2024-03-11 (×12): qty 1

## 2024-03-11 MED ORDER — CALCIUM CARBONATE-SIMETHICONE 750-80 MG PO CHEW: 1.0000 | CHEWABLE_TABLET | ORAL | Status: DC | PRN

## 2024-03-11 MED ORDER — ALLOPURINOL 100 MG PO TABS
200.0000 mg | ORAL_TABLET | Freq: Every day | ORAL | Status: DC
  Administered 2024-03-12 – 2024-03-23 (×12): 200 mg via ORAL
  Filled 2024-03-11 (×12): qty 2

## 2024-03-11 MED ORDER — METOPROLOL SUCCINATE ER 50 MG PO TB24
25.0000 mg | ORAL_TABLET | Freq: Every day | ORAL | Status: DC
  Administered 2024-03-12: 25 mg via ORAL
  Filled 2024-03-11: qty 1

## 2024-03-11 MED ORDER — LIDOCAINE 5 % EX PTCH
1.0000 | MEDICATED_PATCH | CUTANEOUS | Status: DC
  Administered 2024-03-11 – 2024-03-22 (×12): 1 via TRANSDERMAL
  Filled 2024-03-11 (×13): qty 1

## 2024-03-11 MED ORDER — INSULIN ASPART 100 UNIT/ML IJ SOLN
0.0000 [IU] | Freq: Every day | INTRAMUSCULAR | Status: DC
  Administered 2024-03-11 – 2024-03-17 (×4): 2 [IU] via SUBCUTANEOUS
  Administered 2024-03-18 – 2024-03-19 (×2): 4 [IU] via SUBCUTANEOUS
  Administered 2024-03-20 – 2024-03-21 (×2): 3 [IU] via SUBCUTANEOUS
  Filled 2024-03-11 (×8): qty 1

## 2024-03-11 MED ORDER — FUROSEMIDE 10 MG/ML IJ SOLN
20.0000 mg | Freq: Two times a day (BID) | INTRAMUSCULAR | Status: AC
  Administered 2024-03-12 (×2): 20 mg via INTRAVENOUS
  Filled 2024-03-11: qty 4
  Filled 2024-03-11: qty 2

## 2024-03-11 MED ORDER — APIXABAN 5 MG PO TABS
5.0000 mg | ORAL_TABLET | Freq: Two times a day (BID) | ORAL | Status: DC
  Administered 2024-03-11 – 2024-03-12 (×3): 5 mg via ORAL
  Filled 2024-03-11 (×3): qty 1

## 2024-03-11 MED ORDER — SODIUM CHLORIDE 0.9 % IV SOLN
2.0000 g | INTRAVENOUS | Status: AC
  Administered 2024-03-12 – 2024-03-13 (×2): 2 g via INTRAVENOUS
  Filled 2024-03-11 (×2): qty 20

## 2024-03-11 MED ORDER — CALCIUM CARBONATE ANTACID 500 MG PO CHEW
1.0000 | CHEWABLE_TABLET | Freq: Every day | ORAL | Status: DC | PRN
  Filled 2024-03-11: qty 1

## 2024-03-11 MED ORDER — POTASSIUM CHLORIDE CRYS ER 20 MEQ PO TBCR
40.0000 meq | EXTENDED_RELEASE_TABLET | Freq: Once | ORAL | Status: AC
  Administered 2024-03-11: 40 meq via ORAL
  Filled 2024-03-11: qty 2

## 2024-03-11 MED ORDER — NITROGLYCERIN 0.4 MG SL SUBL: 0.4000 mg | SUBLINGUAL_TABLET | SUBLINGUAL | Status: DC | PRN

## 2024-03-11 MED ORDER — IRBESARTAN 150 MG PO TABS
300.0000 mg | ORAL_TABLET | Freq: Every day | ORAL | Status: DC
  Administered 2024-03-12 – 2024-03-20 (×9): 300 mg via ORAL
  Filled 2024-03-11 (×10): qty 2

## 2024-03-11 MED ORDER — ACETAMINOPHEN 325 MG PO TABS
650.0000 mg | ORAL_TABLET | Freq: Four times a day (QID) | ORAL | Status: DC | PRN
  Administered 2024-03-11 – 2024-03-12 (×3): 650 mg via ORAL
  Filled 2024-03-11 (×3): qty 2

## 2024-03-11 MED ORDER — ONDANSETRON HCL 4 MG PO TABS
4.0000 mg | ORAL_TABLET | Freq: Four times a day (QID) | ORAL | Status: AC | PRN
Start: 2024-03-11 — End: 2024-03-16

## 2024-03-11 MED ORDER — ACETAMINOPHEN 500 MG PO TABS
1000.0000 mg | ORAL_TABLET | Freq: Once | ORAL | Status: AC
  Administered 2024-03-11: 1000 mg via ORAL
  Filled 2024-03-11: qty 2

## 2024-03-11 MED ORDER — ALBUTEROL SULFATE (2.5 MG/3ML) 0.083% IN NEBU
3.0000 mL | INHALATION_SOLUTION | RESPIRATORY_TRACT | Status: DC | PRN
  Filled 2024-03-11: qty 3

## 2024-03-11 NOTE — Hospital Course (Signed)
 Mr. Sly Parlee is a 73 year old male with history of non-insulin -dependent diabetes mellitus, PAD, hyperlipidemia, hypertension, history of gout, BPH, COPD on chronic 2 L nasal cannula at baseline, history of heart failure preserved ejection fraction, who presents emergency department for chief concerns of weakness for 10 days.  Vitals in the ED showed temperature of 98.2, respiration rate 22, heart rate 95, blood pressure 136/81, SpO2 98% on 2 L nasal cannula.  Serum sodium is 138, potassium 3.1, chloride 94, bicarb 30, BUN of 19, serum creatinine 1.43, EGFR 51, nonfasting glucose 121, WBC 18.7, hemoglobin 13.6, platelets of 357.  BNP was elevated at 116.1.  HS troponin was 16 and on repeat is 13.  UA was negative for leukocytes, nitrates and positive for rare bacteria.  ED treatment acetaminophen  1000 mg p.o. one-time dose, furosemide  40 mg IV one-time dose, lidocaine  patch, potassium chloride  40 mEq p.o. one-time dose, ceftriaxone  1 g IV one-time dose.

## 2024-03-11 NOTE — H&P (Signed)
 History and Physical   Alfred Carney FMW:[id number] DOB: August 17, 1951 DOA: 03/11/2024  PCP: Corwin Antu, FNP  Outpatient Specialists: Dr. Darlean, pulmonology Patient coming from: home via EMS  I have personally briefly reviewed patient's old medical records in [facility] EMR.  Chief Concern: Weakness, muscle aches  HPI: Alfred Carney is a 73 year old male with history of non-insulin -dependent diabetes mellitus, PAD, hyperlipidemia, hypertension, history of gout, BPH, COPD on chronic 2 L nasal cannula at baseline, history of heart failure preserved ejection fraction, who presents emergency department for chief concerns of weakness for 10 days.  Vitals in the ED showed temperature of 98.2, respiration rate 22, heart rate 95, blood pressure 136/81, SpO2 98% on 2 L nasal cannula.  Serum sodium is 138, potassium 3.1, chloride 94, bicarb 30, BUN of 19, serum creatinine 1.43, EGFR 51, nonfasting glucose 121, WBC 18.7, hemoglobin 13.6, platelets of 357.  BNP was elevated at 116.1.  HS troponin was 16 and on repeat is 13.  UA was negative for leukocytes, nitrates and positive for rare bacteria.  ED treatment acetaminophen  1000 mg p.o. one-time dose, furosemide  40 mg IV one-time dose, lidocaine  patch, potassium chloride  40 mEq p.o. one-time dose, ceftriaxone  1 g IV one-time dose. -------------------------------------- At bedside, patient is able to tell me his first and last name, his age, location, current calendar year.  Reports over the last 2 weeks, he has been having generalized weakness, fatigue, muscle aches.  He does not know if he has had worsening swelling of his lower extremities or weight gain.  He has chronic leg swelling.  He reports that over the last week, he has had a couple of days of noticing blood in his urine.  He reports that this is resolved today.  He denies diarrhea, blood in his stool, dysuria, polyuria.  He denies changes to his diet or recent antibiotic or steroid  use.  He reports he has been having to sleep in a recliner for the last 5 years.  He reports he cannot sleep in his bed because he gets short of breath.  He reports that he was told he had obstructive sleep apnea however could not tolerate a CPAP machine therefore he was prescribed oxygen  to use.  Social history: He lives with his wife.  He denies current tobacco use, quitting in 2008.  He denies EtOH and recreational drug use.  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  ROS: Constitutional: no weight change, no fever ENT/Mouth: no sore throat, no rhinorrhea Eyes: no eye pain, no vision changes Cardiovascular: no chest pain, + dyspnea,  no edema, no palpitations Respiratory: no cough, no sputum, no wheezing Gastrointestinal: no nausea, no vomiting, no diarrhea, no constipation Genitourinary: no urinary incontinence, no dysuria, no hematuria Musculoskeletal: no arthralgias, no myalgias Skin: no skin lesions, no pruritus, Neuro: + weakness, no loss of consciousness, no syncope Psych: no anxiety, no depression, + decrease appetite Heme/Lymph: no bruising, no bleeding  ED Course: Discussed with EDP, patient requiring hospitalization for chief concerns of new onset heart failure.  Assessment/Plan  Principal Problem:   New onset of congestive heart failure (HCC) Active Problems:   Leukocytosis   CAD in native artery   PAD (peripheral artery disease) (HCC)   Hyperlipidemia   Chronic respiratory failure with hypoxia and hypercapnia (HCC)   Venous stasis dermatitis of both lower extremities   Hypertension associated with diabetes (HCC)   Hyperlipidemia associated with type 2 diabetes mellitus (HCC)   BPH with obstruction/lower urinary  tract symptoms   History of gout   Hypokalemia   CKD stage 3a, GFR 45-59 ml/min (HCC)   New onset atrial fibrillation (HCC)   Assessment and Plan:  * New onset of congestive heart failure (HCC) Complete echo ordered ordered on  admission Strict I's and O's Status post furosemide  40 mg IV per EDP I have ordered furosemide  20 mg IV twice daily starting on 03/12/2024, 2 doses  Leukocytosis Etiology workup in progress Check lactic acid x 2, if positive I will obtain blood cultures x 2 and initiate broad-spectrum antibiotics Portable chest x-ray was negative for pneumonia read UA was negative for leukocytes and nitrates and positive for rare bacteria and patient endorsed hematuria several times last week At this time we will treat with ceftriaxone  to complete a 5-day course  New onset atrial fibrillation (HCC) Etiology workup in progress, check TSH UA, portable chest x-ray Complete echo ordered on admission Home metoprolol  succinate 25 mg resumed Metoprolol  tartrate 5 mg IV every 4 hours as needed for heart rate greater than 120, 3 doses ordered  Hypokalemia Check serum magnesium level which was 2.1 Status post potassium chloride  replacement by EDP Recheck BMP in a.m.  History of gout Home allopurinol  200 mg daily resumed  Hyperlipidemia associated with type 2 diabetes mellitus (HCC) Home pravastatin  40 mg daily resumed  Hypertension associated with diabetes (HCC) Home metoprolol  succinate 25 mg daily, irbesartan  300 mg daily Hydralazine  5 mg IV q6h prn for sbp > 165 mmHg, 5 days  Chart reviewed.   DVT prophylaxis: Eliquis 5 mg p.o. twice daily Code Status: Full code Diet: Heart healthy/carb modified Family Communication: Updated brother, son, daughter at bedside with patient's permission Disposition Plan: Pending clinical course Consults called: None at this time Admission status: Telemetry cardiac, inpt  Past Medical History:  Diagnosis Date   Chronic obstructive pulmonary disease (HCC) 2008   Moderate   Colon polyps    Community acquired pneumonia    Coronary artery disease    Degenerative joint disease    Diabetes mellitus without complication (HCC)    type 2   Emphysema lung (HCC)     Enlarged liver    fatty liver by '09 CT   Gastroesophageal reflux disease    Gout    H/O hiatal hernia    History of Rocky [place] spotted fever    Possible history of [place] Spotted Fever   Hyperlipidemia    Hypertension    Hypokalemia    diuretic induced, resolved   Microcytic anemia    iron pills   Morbid obesity (HCC)    Shortness of breath    Skin cancer    skin cancer lip removed   Sleep apnea    not currently using CPAP 05/20/13   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 LEVEL  10/23/2018       ESOPHAGOGASTRODUODENOSCOPY (EGD) WITH PROPOFOL  N/A 01/22/2018   Procedure: ESOPHAGOGASTRODUODENOSCOPY (EGD) WITH PROPOFOL ;  Surgeon: Shila Gustav GAILS, MD;  Location: [facility];  Service: Endoscopy;  Laterality: N/A;   HARDWARE REMOVAL Left 05/26/2013   Procedure: HARDWARE  REMOVAL;  Surgeon: Jerona LULLA Sage, MD;  Location: [facility] OR;  Service: Orthopedics;  Laterality: Left;  Left Total Hip Arthroplasty, Removal of Deep Hardware   HIP SURGERY     Status post left hip surgery with bone grafting   IR INJECT/THERA/INC NEEDLE/CATH/PLC EPI/LUMB/SAC W/IMG  10/23/2018   LEFT HEART CATHETERIZATION WITH CORONARY ANGIOGRAM N/A 01/31/2014   Procedure: LEFT HEART CATHETERIZATION WITH CORONARY ANGIOGRAM;  Surgeon: Lonni JONETTA Cash, MD;  Location: [facility] CATH LAB;  Service: Cardiovascular;  Laterality: N/A;   LUMBAR LAMINECTOMY/DECOMPRESSION MICRODISCECTOMY Left 10/28/2018   Procedure: Left Lumbar three-four Extraforaminal microdiscectomy;  Surgeon: Alix Charleston, MD;  Location: [facility] OR;  Service:  Neurosurgery;  Laterality: Left;   THORACOABDOMINAL AORTIC ANEURYSM REPAIR     with right femoral and left iliac BPG and reimplantation of renal arteries.   TONSILLECTOMY     TONSILLECTOMY     TOTAL HIP ARTHROPLASTY Left 05/26/2013   Procedure: TOTAL HIP ARTHROPLASTY;  Surgeon: Jerona LULLA Sage, MD;  Location: [facility] OR;  Service: Orthopedics;  Laterality: Left;  Left Total Hip Arthroplasty, Removal Deep Hardware   Social History:  reports that he quit smoking about 17 years ago. His smoking use included cigarettes. He started smoking about 52 years ago. He has a 35 pack-year smoking history. He has never used smokeless tobacco. He reports that he does not drink alcohol and does not use drugs.  No Known Allergies Family History  Problem Relation Age of Onset   Osteoarthritis Mother    Diabetes Mother    Pulmonary embolism Mother    Heart disease Father        Coronary Artery Disease   Stroke Father    Multiple sclerosis Sister    Factor V Leiden deficiency Sister    Emphysema Sister    Hyperlipidemia Brother    Heart attack Maternal Grandmother    Lung cancer Maternal Grandfather        smoked   Family history: Family history reviewed and not pertinent.  Prior to Admission medications   Medication Sig Start Date End Date Taking? Authorizing Provider  albuterol  (VENTOLIN  HFA) 108 (90 Base) MCG/ACT inhaler Inhale 2 puffs into the lungs every 4 (four) hours as needed for wheezing or shortness of breath. 11/05/22  Yes Darlean Ozell NOVAK, MD  allopurinol  (ZYLOPRIM ) 100 MG tablet Take 2 tablets (200 mg total) by mouth daily. 07/08/23 07/07/24 Yes Dugal, Ginger, FNP  Calcium  Carbonate-Simethicone 750-80 MG CHEW Chew 1 tablet by mouth as needed (gas).    Yes [provider]  Cholecalciferol  (VITAMIN D  PO) Take 1,000 Units by mouth daily.   Yes [provider]  glipiZIDE  (GLUCOTROL ) 5 MG tablet TAKE 1 TABLET BY MOUTH TWICE DAILY BEFORE A MEAL 12/01/23  Yes Dugal, Tabitha, FNP   irbesartan  (AVAPRO ) 300 MG tablet Take 1 tablet by mouth once daily 12/26/23  Yes Dugal, Tabitha, FNP  meloxicam  (MOBIC ) 7.5 MG tablet Take 1 tablet by mouth once daily 12/01/23  Yes Dugal, Tabitha, FNP  metoprolol  succinate (TOPROL -XL) 25 MG 24 hr tablet Take 1 tablet by mouth once daily 12/26/23  Yes Dugal, Tabitha, FNP  pravastatin  (PRAVACHOL ) 40 MG tablet Take 1 tablet (40 mg total) by mouth daily. 05/21/23  Yes Corwin Ginger, FNP  tamsulosin  (FLOMAX ) 0.4 MG CAPS capsule Take 1 capsule by mouth at bedtime 02/13/24  Yes Dugal, Tabitha, FNP  clobetasol  cream (TEMOVATE ) 0.05 % Apply 1 Application topically 2 (two) times daily. Patient not taking: Reported on 03/11/2024 05/28/23   Copland, Jacques,  MD  furosemide  (LASIX ) 20 MG tablet Take 2 tablets by mouth once daily 02/27/24   Dugal, Tabitha, FNP  glucose blood test strip Use as instructed 05/29/23   Dugal, Tabitha, FNP  ibuprofen  (ADVIL ) 200 MG tablet Take 200 mg by mouth every 6 (six) hours as needed. Patient not taking: Reported on 03/11/2024    [provider]  ketoconazole  (NIZORAL ) 2 % shampoo Apply topically to affected area once daily as needed for irritation Patient not taking: Reported on 03/11/2024 03/11/23   Corwin Antu, FNP  Lancet Device MISC 1 Lancet by Does not apply route daily as needed. 05/29/23   Dugal, Tabitha, FNP  nitroGLYCERIN  (NITROSTAT ) 0.4 MG SL tablet Place 1 tablet (0.4 mg total) under the tongue every 5 (five) minutes x 3 doses as needed for chest pain. Patient not taking: Reported on 03/11/2024 01/04/19   Velma Raisin, MD  OXYGEN  Inhale 2 L into the lungs continuous.     [provider]  tacrolimus (PROTOPIC) 0.1 % ointment Apply topically 2 (two) times daily. 2 weeks    [provider]   Physical Exam: Vitals:   03/11/24 1153 03/11/24 1332 03/11/24 1610 03/11/24 1730  BP:   (!) 150/73 (!) 141/55  Pulse:   95 71  Resp:   19 15  Temp: 98.2 F (36.8 C)  98.3 F (36.8 C)   TempSrc: Oral   Oral   SpO2:   93% 100%  Weight:  (!) 146.1 kg     Constitutional: appears age-appropriate, frail Eyes: PERRL, lids and conjunctivae normal ENMT: Mucous membranes are moist. Posterior pharynx clear of any exudate or lesions. Age-appropriate dentition. Hearing appropriate Neck: normal, supple, no masses, no thyromegaly Respiratory: clear to auscultation bilaterally, no wheezing, no crackles. Normal respiratory effort. No accessory muscle use.  Cardiovascular: Regular rate and rhythm, no murmurs / rubs / gallops.  Bilateral lower extremity edema. 2+ pedal pulses. No carotid bruits.  Abdomen: Morbidly obese abdomen, no tenderness, no masses palpated, no hepatosplenomegaly. Bowel sounds positive.  Musculoskeletal: no clubbing / cyanosis. No joint deformity upper and lower extremities. Good ROM, no contractures, no atrophy. Normal muscle tone.  Skin: no rashes, lesions, ulcers. No induration Neurologic: Sensation intact. Strength 5/5 in all 4.  Psychiatric: Normal judgment and insight. Alert and oriented x 3. Normal mood.   EKG: independently reviewed, showing atrial fibrillation with rate of 101, QTc 452  Chest x-ray on Admission: I personally reviewed and I agree with radiologist reading as below.  DG Chest 1 View Result Date: 03/11/2024 CLINICAL DATA:  sob EXAM: CHEST  1 VIEW COMPARISON:  November 19, 2017 FINDINGS: Biapical pleural thickening. Emphysema. Unchanged chronic blunting of the left costophrenic sulcus. No new airspace consolidation or pleural effusion. Mild cardiomegaly. Tortuous aorta with aortic atherosclerosis. No acute fracture or destructive lesions. Multilevel thoracic osteophytosis. IMPRESSION: Emphysema.  No acute cardiopulmonary abnormality. Electronically Signed   By: Rogelia Myers M.D.   On: 03/11/2024 08:37   Labs on Admission: I have personally reviewed following labs  CBC: Recent Labs  Lab 03/11/24 0818  WBC 18.7*  NEUTROABS 15.0*  HGB 13.6  HCT 42.9  MCV 90.7   PLT 357   Basic Metabolic Panel: Recent Labs  Lab 03/11/24 0818  NA 138  K 3.1*  CL 94*  CO2 30  GLUCOSE 121*  BUN 19  CREATININE 1.43*  CALCIUM  8.9  MG 2.1   GFR: Estimated Creatinine Clearance: 64.7 mL/min (A) (by C-G formula based on SCr of 1.43 mg/dL (  H)).  Liver Function Tests: Recent Labs  Lab 03/11/24 0818  AST 37  ALT 30  ALKPHOS 89  BILITOT 1.1  PROT 7.2  ALBUMIN  2.8*   BNP (last 3 results) Recent Labs    05/20/23 1100  PROBNP 61.0   Urine analysis:    Component Value Date/Time   COLORURINE YELLOW (A) 03/11/2024 1124   APPEARANCEUR CLEAR (A) 03/11/2024 1124   LABSPEC 1.018 03/11/2024 1124   PHURINE 5.0 03/11/2024 1124   GLUCOSEU NEGATIVE 03/11/2024 1124   HGBUR NEGATIVE 03/11/2024 1124   BILIRUBINUR NEGATIVE 03/11/2024 1124   KETONESUR NEGATIVE 03/11/2024 1124   PROTEINUR 30 (A) 03/11/2024 1124   UROBILINOGEN 1.0 05/05/2014 1706   NITRITE NEGATIVE 03/11/2024 1124   LEUKOCYTESUR NEGATIVE 03/11/2024 1124   This document was prepared using Dragon Voice Recognition software and may include unintentional dictation errors.  Dr. Sherre [facility]  If 7PM-7AM, please contact overnight-coverage provider If 7AM-7PM, please contact day attending provider [email or web address]  03/11/2024, 5:44 PM

## 2024-03-11 NOTE — Telephone Encounter (Signed)
 Patient Product/process development scientist completed.    The patient is insured through [facility]. Patient has Medicare and is not eligible for a copay card, but may be able to apply for patient assistance or Medicare RX Payment Plan (Patient Must reach out to their plan, if eligible for payment plan), if available.    Ran test claim for Eliquis 5 mg and the current 30 day co-pay is $383.81 due to a deductible.   This test claim was processed through [facility]- copay amounts may vary at other pharmacies due to pharmacy/plan contracts, or as the patient moves through the different stages of their insurance plan.     Reyes Sharps, CPHT Pharmacy Technician III Certified Patient Advocate [facility] Patient Advocate Team Direct Number: [phone]  Fax: [phone]

## 2024-03-11 NOTE — ED Provider Notes (Signed)
 Alfred Carney Provider Note    Event Date/Time   First MD Initiated Contact with Patient 03/11/24 [phone]     (approximate)   History   Weakness   HPI  Alfred Carney is a 73 y.o. male with history of CHF, COPD on 2 L at baseline, CKD, presenting with dysuria and generalized weakness for the last 10 days.  Patient states that for last couple days, he had upped his oxygen  to 3 L, he denies any chest pain or cough or fever, no nausea, vomiting, diarrhea, abdominal pain.  No recent trauma or falls.  States that he has noticed worsening shortness of breath especially with movement.  Per independent from EMS, he was found on 4 L nasal cannula, unknown what his room air sat was, was very short of breath when transferring.  Patient also states that he has history of gout, has chronic pain to his ankles.  Independent history obtained from EMS as above.  On independent chart review, he was seen by his pulmonologist in September, is on 2 L nasal cannula at rest, was satting 94% on that oxygen  level in the clinic, he was instructed to check his oxygen  level at highest level of activity and to make sure that it remains over 90%.   Physical Exam   Triage Vital Signs: ED Triage Vitals  Encounter Vitals Group     BP 03/11/24 0747 (!) 129/105     Girls Systolic BP Percentile --      Girls Diastolic BP Percentile --      Boys Systolic BP Percentile --      Boys Diastolic BP Percentile --      Pulse --      Resp 03/11/24 0747 20     Temp 03/11/24 0747 98.7 F (37.1 C)     Temp Source 03/11/24 0747 Oral     SpO2 03/11/24 0746 98 %     Weight --      Height --      Head Circumference --      Peak Flow --      Pain Score 03/11/24 0748 10     Pain Loc --      Pain Education --      Exclude from Growth Chart --     Most recent vital signs: Vitals:   03/11/24 1000 03/11/24 1153  BP: 114/61   Pulse: 94   Resp: (!) 21   Temp:  98.2 F (36.8 C)  SpO2: 95%       General: Awake, no distress.  CV:  Good peripheral perfusion.  Resp:  Normal effort.  Tachypneic, clear Abd:  No distention.  Soft nontender Other:  Bilateral lower extremity edema that appears symmetrical   ED Results / Procedures / Treatments   Labs (all labs ordered are listed, but only abnormal results are displayed) Labs Reviewed  COMPREHENSIVE METABOLIC PANEL WITH GFR - Abnormal; Notable for the following components:      Result Value   Potassium 3.1 (*)    Chloride 94 (*)    Glucose, Bld 121 (*)    Creatinine, Ser 1.43 (*)    Albumin  2.8 (*)    GFR, Estimated 51 (*)    All other components within normal limits  CBC WITH DIFFERENTIAL/PLATELET - Abnormal; Notable for the following components:   WBC 18.7 (*)    Neutro Abs 15.0 (*)    Monocytes Absolute 1.4 (*)    Abs Immature Granulocytes 0.16 (*)  All other components within normal limits  BRAIN NATRIURETIC PEPTIDE - Abnormal; Notable for the following components:   B Natriuretic Peptide 116.1 (*)    All other components within normal limits  BLOOD GAS, VENOUS - Abnormal; Notable for the following components:   Bicarbonate 34.5 (*)    Acid-Base Excess 7.6 (*)    All other components within normal limits  URINALYSIS, W/ REFLEX TO CULTURE (INFECTION SUSPECTED) - Abnormal; Notable for the following components:   Color, Urine YELLOW (*)    APPearance CLEAR (*)    Protein, ur 30 (*)    Bacteria, UA RARE (*)    All other components within normal limits  MAGNESIUM  TROPONIN I (HIGH SENSITIVITY)  TROPONIN I (HIGH SENSITIVITY)     EKG  EKG shows, atrial fibrillation, rate 101, normal QRS, normal QTc, no ischemic ST elevation, T wave flattening in 1, aVL, this is changed compared to prior   RADIOLOGY On my independent interpretation, chest x-ray without obvious opacities, left costophrenic angle is blunted,?  Pleural effusion, chest x-ray read as chronic   PROCEDURES:  Critical Care performed:  No  Procedures   MEDICATIONS ORDERED IN ED: Medications  lidocaine  (LIDODERM ) 5 % 1 patch (1 patch Transdermal Patch Applied 03/11/24 0908)  furosemide  (LASIX ) injection 40 mg (has no administration in time range)  cefTRIAXone  (ROCEPHIN ) 1 g in sodium chloride  0.9 % 100 mL IVPB (has no administration in time range)  allopurinol  (ZYLOPRIM ) tablet 200 mg (has no administration in time range)  acetaminophen  (TYLENOL ) tablet 650 mg (has no administration in time range)    Or  acetaminophen  (TYLENOL ) suppository 650 mg (has no administration in time range)  ondansetron  (ZOFRAN ) tablet 4 mg (has no administration in time range)    Or  ondansetron  (ZOFRAN ) injection 4 mg (has no administration in time range)  furosemide  (LASIX ) injection 20 mg (has no administration in time range)  acetaminophen  (TYLENOL ) tablet 1,000 mg (1,000 mg Oral Given 03/11/24 0908)  potassium chloride  SA (KLOR-CON  M) CR tablet 40 mEq (40 mEq Oral Given 03/11/24 0921)     IMPRESSION / MDM / ASSESSMENT AND PLAN / ED COURSE  I reviewed the triage vital signs and the nursing notes.                              Differential diagnosis includes, but is not limited to, UTI, electrolyte derangements, CHF, ACS, arrhythmia, considered COPD but he does not have any wheezing on exam, also consider pneumonia, viral illness.  Will get labs, EKG, troponin, chest x-ray, UA.  Patient's presentation is most consistent with acute presentation with potential threat to life or bodily function.  Independent interpretation of labs and imaging below.  Given the shortness of breath, increased oxygen  requirement at home, leg swelling, will give him a dose of IV Lasix  here and plan to have him admitted for CHF exacerbation, generalized weakness as well as new A-fib.  Consult the hospitalist who is agreeable with the plan for admission and will evaluate the patient.  He is admitted.  The patient is on the cardiac monitor to evaluate for evidence  of arrhythmia and/or significant heart rate changes.   Clinical Course as of 03/11/24 1310  Thu Mar 11, 2024  0854 DG Chest 1 View Emphysema.  No acute cardiopulmonary abnormality.  [TT]  1101 Independent review of labs, mild hypokalemia, mild AKI, LFTs are normal, he has a leukocytosis, troponin is normal, BNP  is mildly elevated.  Will replete his potassium. [TT]  1210 Urinalysis, w/ Reflex to Culture (Infection Suspected) -Urine, Clean Catch(!) UA shows rare bacteria, given the dysuria, will treat with IV ceftriaxone . [TT]    Clinical Course User Index [TT] Waymond Lorelle Cummins, MD     FINAL CLINICAL IMPRESSION(S) / ED DIAGNOSES   Final diagnoses:  Weakness  Dysuria  New onset a-fib (HCC)  Acute on chronic congestive heart failure, unspecified heart failure type (HCC)  Urinary tract infection without hematuria, site unspecified     Rx / DC Orders   ED Discharge Orders     None        Note:  This document was prepared using Dragon voice recognition software and may include unintentional dictation errors.    Waymond Lorelle Cummins, MD 03/11/24 1310

## 2024-03-11 NOTE — Assessment & Plan Note (Addendum)
 Home metoprolol  succinate 25 mg daily, irbesartan  300 mg daily Hydralazine  5 mg IV q6h prn for sbp > 165 mmHg, 5 days

## 2024-03-11 NOTE — ED Notes (Signed)
 Patient's HR intermittently jumps from 70s to 140s while patient is laying in bed. MD [facility] notified.

## 2024-03-11 NOTE — Assessment & Plan Note (Signed)
 Check serum magnesium level which was 2.1 Status post potassium chloride  replacement by EDP Recheck BMP in a.m.

## 2024-03-11 NOTE — Assessment & Plan Note (Signed)
 Etiology workup in progress, check TSH UA, portable chest x-ray Complete echo ordered on admission Home metoprolol  succinate 25 mg resumed Metoprolol  tartrate 5 mg IV every 4 hours as needed for heart rate greater than 120, 3 doses ordered

## 2024-03-11 NOTE — Assessment & Plan Note (Signed)
Home pravastatin 40 mg daily resumed

## 2024-03-11 NOTE — Assessment & Plan Note (Signed)
 Home allopurinol  200 mg daily resumed

## 2024-03-11 NOTE — Assessment & Plan Note (Addendum)
 Etiology workup in progress Check lactic acid x 2, if positive I will obtain blood cultures x 2 and initiate broad-spectrum antibiotics Portable chest x-ray was negative for pneumonia read UA was negative for leukocytes and nitrates and positive for rare bacteria and patient endorsed hematuria several times last week At this time we will treat with ceftriaxone  to complete a 5-day course

## 2024-03-11 NOTE — Assessment & Plan Note (Signed)
 Complete echo ordered ordered on admission Strict I's and O's Status post furosemide  40 mg IV per EDP I have ordered furosemide  20 mg IV twice daily starting on 03/12/2024, 2 doses

## 2024-03-11 NOTE — ED Triage Notes (Addendum)
 Pt arrives via [facility] from home with c/o weakness x10days. Pt is SOB resting and tripod sitting after trying to transfer pt to wheelchair.

## 2024-03-12 ENCOUNTER — Inpatient Hospital Stay (HOSPITAL_COMMUNITY)
Admit: 2024-03-12 | Discharge: 2024-03-12 | Disposition: A | Discharge status: Home / Self Care | Attending: Internal Medicine | Admitting: Internal Medicine

## 2024-03-12 DIAGNOSIS — I509 Heart failure, unspecified: Secondary | ICD-10-CM | POA: Diagnosis not present

## 2024-03-12 DIAGNOSIS — I5033 Acute on chronic diastolic (congestive) heart failure: Secondary | ICD-10-CM | POA: Diagnosis not present

## 2024-03-12 DIAGNOSIS — R0609 Other forms of dyspnea: Secondary | ICD-10-CM

## 2024-03-12 DIAGNOSIS — I4891 Unspecified atrial fibrillation: Secondary | ICD-10-CM

## 2024-03-12 DIAGNOSIS — I4719 Other supraventricular tachycardia: Secondary | ICD-10-CM | POA: Diagnosis not present

## 2024-03-12 DIAGNOSIS — G4733 Obstructive sleep apnea (adult) (pediatric): Secondary | ICD-10-CM

## 2024-03-12 LAB — BASIC METABOLIC PANEL WITH GFR
Anion gap: 12 (ref 5–15)
BUN: 23 mg/dL (ref 8–23)
CO2: 32 mmol/L (ref 22–32)
Calcium: 8.4 mg/dL — ABNORMAL LOW (ref 8.9–10.3)
Chloride: 96 mmol/L — ABNORMAL LOW (ref 98–111)
Creatinine, Ser: 1.33 mg/dL — ABNORMAL HIGH (ref 0.61–1.24)
GFR, Estimated: 56 mL/min — ABNORMAL LOW (ref >60)
Glucose, Bld: 165 mg/dL — ABNORMAL HIGH (ref 70–99)
Potassium: 3.6 mmol/L (ref 3.5–5.1)
Sodium: 140 mmol/L (ref 135–145)

## 2024-03-12 LAB — ECHOCARDIOGRAM COMPLETE
AR max vel: 2.25 cm2
AV Area VTI: 2.25 cm2
AV Area mean vel: 2.17 cm2
AV Mean grad: 3 mmHg
AV Peak grad: 5.7 mmHg
Ao pk vel: 1.19 m/s
Area-P 1/2: 3.31 cm2
Calc EF: 36.3 %
MV VTI: 2.01 cm2
Single Plane A2C EF: 35.9 %
Single Plane A4C EF: 44.8 %
Weight: 5152 [oz_av]

## 2024-03-12 LAB — CBG MONITORING, ED
Glucose-Capillary: 173 mg/dL — ABNORMAL HIGH (ref 70–99)
Glucose-Capillary: 154 mg/dL — ABNORMAL HIGH (ref 70–99)

## 2024-03-12 LAB — CBC
HCT: 39.9 % (ref 39.0–52.0)
Hemoglobin: 12.8 g/dL — ABNORMAL LOW (ref 13.0–17.0)
MCH: 29 pg (ref 26.0–34.0)
MCHC: 32.1 g/dL (ref 30.0–36.0)
MCV: 90.3 fL (ref 80.0–100.0)
Platelets: 356 10*3/uL (ref 150–400)
RBC: 4.42 MIL/uL (ref 4.22–5.81)
RDW: 14.6 % (ref 11.5–15.5)
WBC: 16.4 10*3/uL — ABNORMAL HIGH (ref 4.0–10.5)
nRBC: 0 % (ref 0.0–0.2)

## 2024-03-12 LAB — GLUCOSE, CAPILLARY
Glucose-Capillary: 164 mg/dL — ABNORMAL HIGH (ref 70–99)
Glucose-Capillary: 225 mg/dL — ABNORMAL HIGH (ref 70–99)

## 2024-03-12 MED ORDER — DILTIAZEM HCL 25 MG/5ML IV SOLN
INTRAVENOUS | Status: AC
  Filled 2024-03-12: qty 5

## 2024-03-12 MED ORDER — DILTIAZEM HCL 25 MG/5ML IV SOLN
10.0000 mg | Freq: Once | INTRAVENOUS | Status: AC
  Administered 2024-03-12: 10 mg via INTRAVENOUS

## 2024-03-12 MED ORDER — ACETAMINOPHEN 650 MG RE SUPP: 650.0000 mg | Freq: Four times a day (QID) | RECTAL | Status: AC | PRN

## 2024-03-12 MED ORDER — ACETAMINOPHEN 500 MG PO TABS
1000.0000 mg | ORAL_TABLET | Freq: Four times a day (QID) | ORAL | Status: AC | PRN
Start: 2024-03-12 — End: 2024-03-16
  Administered 2024-03-13 – 2024-03-15 (×6): 1000 mg via ORAL
  Filled 2024-03-12 (×6): qty 2

## 2024-03-12 MED ORDER — PERFLUTREN LIPID MICROSPHERE
1.0000 mL | INTRAVENOUS | Status: AC | PRN
  Administered 2024-03-12: 2 mL via INTRAVENOUS

## 2024-03-12 MED ORDER — ENOXAPARIN SODIUM 80 MG/0.8ML IJ SOSY
70.0000 mg | PREFILLED_SYRINGE | INTRAMUSCULAR | Status: DC
  Administered 2024-03-12 – 2024-03-14 (×3): 70 mg via SUBCUTANEOUS
  Filled 2024-03-12: qty 0.8
  Filled 2024-03-12: qty 0.7
  Filled 2024-03-12 (×2): qty 0.8

## 2024-03-12 MED ORDER — TRAMADOL HCL 50 MG PO TABS
50.0000 mg | ORAL_TABLET | Freq: Once | ORAL | Status: AC
  Administered 2024-03-12: 50 mg via ORAL
  Filled 2024-03-12: qty 1

## 2024-03-12 MED ORDER — DILTIAZEM HCL 30 MG PO TABS
30.0000 mg | ORAL_TABLET | Freq: Three times a day (TID) | ORAL | Status: DC
  Administered 2024-03-12 – 2024-03-13 (×3): 30 mg via ORAL
  Filled 2024-03-12 (×3): qty 1

## 2024-03-12 NOTE — ED Notes (Signed)
 Admit MD Tobie at bedside speaking with pt and family.

## 2024-03-12 NOTE — ED Notes (Signed)
 MD Tobie raker regarding pt HR and low BP.

## 2024-03-12 NOTE — ED Notes (Signed)
 Pharmacy messaged regarding missing doses

## 2024-03-12 NOTE — ED Notes (Signed)
 Cho at bedside

## 2024-03-12 NOTE — ED Notes (Signed)
 Pt given lunch tray.

## 2024-03-12 NOTE — ED Notes (Signed)
 Pt saturated his bed due to purewick coming off. Pt cleaned up and placed in clean gown. Pt linens changed and chux placed. New purewick placed on pt and pt pulled up in bed.

## 2024-03-12 NOTE — Consult Note (Signed)
 Cardiology Consultation:   Patient ID: Alfred Carney; [id number]; 10-10-1950   Admit date: 03/11/2024 Date of Consult: 03/12/2024  Primary Care Provider: Corwin Antu, FNP Primary Cardiologist: New - consult by Darron, MD Primary Electrophysiologist:  None   Patient Profile:   Alfred Carney is a 73 y.o. 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 Tobie.  History of Present Illness:   [name] underwent LHC in 2007 that showed 60 to 70% mid RCA stenosis that was unchanged when compared to study in 2004.  He was previously followed by Dr. [place] and our [facility] team, last seeing them in 2015.  Most recent Lexiscan  MPI in 2013 was without evidence of ischemia.  Most recent echo from 05/2020 showed an EF of 60 to 65%, no regional wall motion abnormalities, grade 1 diastolic dysfunction, normal RV systolic function and ventricular cavity size, moderately dilated left atrium, trivial mitral regurgitation, and an estimated right atrial pressure of 3 mmHg.  Carotid artery ultrasound in 11/2023 showed stable 40 to 59% left ICA stenosis with 1 to 39% right ICA stenosis.   He presented to [facility] on 03/11/2024 with a 2-week history of generalized malaise, fatigue, myalgias, arthralgias, lower extremity weakness, multiple falls, and chronic dyspnea.  Never with chest pain, palpitations, dizziness, presyncope, or syncope.  Initial BP 129/105 with a heart rate of 95 bpm, oxygen  saturation 94% on 2 L via nasal cannula, and a Tmax of 99.8.  Chest x-ray notable for emphysema with no acute cardiopulmonary abnormality.  EKG shows sinus tachycardia with PACs.  High-sensitivity troponin negative x 2.  BNP 116.  Potassium 3.1  trending to 3.6.  Serum creatinine 1.43 trending to 1.33.  Albumin  2.8.  Magnesium 2.1.  TSH normal.  WBC 18.7, Hgb 13.6, PLT 357.  In the ER he received IV Lasix  40 mg 1 times dose, KCl 40 mEq, ceftriaxone , acetaminophen .  Overnight he was given 5 mg of IV Lopressor  for tachycardic rates.  Upon admission he was placed on IV Lasix  20 mg twice daily and started on apixaban  for A-fib as well as Toprol -XL 25 mg daily.  On telemetry he has had paroxysms of narrow complex tachycardia consistent with atrial tachycardia.  At time of cardiology consult he denies any chest pain and reports his chronic dyspnea is stable.  No palpitations, dizziness, presyncope, or syncope.  Lower extremity swelling is at his baseline.  No progressive orthopnea.  Echo pending.   Past Medical History:  Diagnosis Date   Chronic obstructive pulmonary disease (HCC) 2008   Moderate   Colon polyps    Community acquired pneumonia    Coronary artery disease    Degenerative joint disease    Diabetes mellitus without complication (HCC)    type 2   Emphysema lung (HCC)    Enlarged liver    fatty liver by '09 CT   Gastroesophageal reflux disease    Gout    H/O hiatal hernia    History of [place] spotted fever    Possible history of [place] Spotted Fever   Hyperlipidemia    Hypertension    Hypokalemia    diuretic induced, resolved   Microcytic anemia    iron pills   Morbid obesity (  HCC)    Shortness of breath    Skin cancer    skin cancer lip removed   Sleep apnea    not currently using CPAP 05/20/13   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 LEVEL  10/23/2018       ESOPHAGOGASTRODUODENOSCOPY (EGD) WITH PROPOFOL  N/A 01/22/2018   Procedure: ESOPHAGOGASTRODUODENOSCOPY (EGD) WITH PROPOFOL ;  Surgeon: Shila Gustav GAILS, MD;  Location: [facility];  Service: Endoscopy;  Laterality: N/A;   HARDWARE REMOVAL Left 05/26/2013   Procedure: HARDWARE REMOVAL;  Surgeon: Jerona GAILS Sage, MD;  Location: [facility] OR;  Service: Orthopedics;  Laterality: Left;  Left Total Hip Arthroplasty, Removal of Deep Hardware   HIP SURGERY     Status post left hip surgery with bone grafting   IR INJECT/THERA/INC NEEDLE/CATH/PLC EPI/LUMB/SAC W/IMG  10/23/2018   LEFT HEART CATHETERIZATION WITH CORONARY ANGIOGRAM N/A 01/31/2014   Procedure: LEFT HEART CATHETERIZATION WITH CORONARY ANGIOGRAM;  Surgeon: Lonni JONETTA Cash, MD;  Location: [facility] CATH LAB;  Service: Cardiovascular;  Laterality: N/A;   LUMBAR LAMINECTOMY/DECOMPRESSION MICRODISCECTOMY Left 10/28/2018   Procedure: Left Lumbar three-four Extraforaminal microdiscectomy;  Surgeon: Alix Charleston, MD;  Location[facility];  Service: Neurosurgery;  Laterality: Left;   THORACOABDOMINAL AORTIC ANEURYSM REPAIR     with right femoral and left iliac BPG and reimplantation of renal arteries.   TONSILLECTOMY     TONSILLECTOMY     TOTAL HIP ARTHROPLASTY Left 05/26/2013   Procedure: TOTAL HIP ARTHROPLASTY;  Surgeon: Jerona GAILS Sage, MD;  Location: [facility] OR;  Service: Orthopedics;  Laterality: Left;  Left Total Hip Arthroplasty, Removal Deep Hardware     Home Meds: Prior to Admission medications   Medication Sig Start Date End Date Taking? Authorizing Provider  albuterol  (VENTOLIN  HFA) 108 (90 Base) MCG/ACT inhaler Inhale 2 puffs into the lungs every 4 (four) hours as needed for wheezing or shortness of breath. 11/05/22  Yes Darlean Ozell NOVAK, MD  allopurinol  (ZYLOPRIM ) 100 MG tablet Take 2 tablets (200 mg total) by mouth daily. 07/08/23 07/07/24 Yes Dugal, Ginger, FNP  Calcium  Carbonate-Simethicone   750-80 MG CHEW Chew 1 tablet by mouth as needed (gas).    Yes [provider]  Cholecalciferol  (VITAMIN D  PO) Take 1,000 Units by mouth daily.   Yes [provider]  glipiZIDE  (GLUCOTROL ) 5 MG tablet TAKE 1 TABLET BY MOUTH TWICE DAILY BEFORE A MEAL 12/01/23  Yes Dugal, Tabitha, FNP  irbesartan  (AVAPRO ) 300 MG tablet Take 1 tablet by mouth once daily 12/26/23  Yes Dugal, Tabitha, FNP  meloxicam  (MOBIC ) 7.5 MG tablet Take 1 tablet by mouth once daily 12/01/23  Yes Dugal, Tabitha, FNP  metoprolol  succinate (TOPROL -XL) 25 MG 24 hr tablet Take 1 tablet by mouth once daily 12/26/23  Yes Dugal, Tabitha, FNP  pravastatin  (PRAVACHOL ) 40 MG tablet Take 1 tablet (40 mg total) by mouth daily. 05/21/23  Yes Dugal, Tabitha, FNP  tamsulosin  (FLOMAX ) 0.4 MG CAPS capsule Take 1 capsule by mouth at bedtime 02/13/24  Yes Dugal, Tabitha, FNP  clobetasol  cream (TEMOVATE ) 0.05 % Apply 1 Application topically 2 (two) times daily. Patient not  taking: Reported on 03/11/2024 05/28/23   Watt Mirza, MD  furosemide  (LASIX ) 20 MG tablet Take 2 tablets by mouth once daily 02/27/24   Dugal, Tabitha, FNP  glucose blood test strip Use as instructed 05/29/23   Dugal, Tabitha, FNP  ibuprofen  (ADVIL ) 200 MG tablet Take 200 mg by mouth every 6 (six) hours as needed. Patient not taking: Reported on 03/11/2024    [provider]  ketoconazole  (NIZORAL ) 2 % shampoo Apply topically to affected area once daily as needed for irritation Patient not taking: Reported on 03/11/2024 03/11/23   Corwin Antu, FNP  Lancet Device MISC 1 Lancet by Does not apply route daily as needed. 05/29/23   Corwin Antu, FNP  nitroGLYCERIN  (NITROSTAT ) 0.4 MG SL tablet Place 1 tablet (0.4 mg total) under the tongue every 5 (five) minutes x 3 doses as needed for chest pain. Patient not taking: Reported on 03/11/2024 01/04/19   Velma Raisin, MD  OXYGEN  Inhale 2 L into the lungs continuous.     [provider]  tacrolimus (PROTOPIC)  0.1 % ointment Apply topically 2 (two) times daily. 2 weeks    [provider]    Inpatient Medications: Scheduled Meds:  allopurinol   200 mg Oral Daily   apixaban   5 mg Oral BID   cholecalciferol   1,000 Units Oral Daily   furosemide   20 mg Intravenous BID   insulin  aspart  0-15 Units Subcutaneous TID WC   insulin  aspart  0-5 Units Subcutaneous QHS   irbesartan   300 mg Oral Daily   lidocaine   1 patch Transdermal Q24H   metoprolol  succinate  25 mg Oral Daily   pravastatin   40 mg Oral q1800   tamsulosin   0.4 mg Oral QHS   Continuous Infusions:  cefTRIAXone  (ROCEPHIN )  IV Stopped (03/12/24 1034)   PRN Meds: acetaminophen  **OR** acetaminophen , albuterol , calcium  carbonate **AND** simethicone , hydrALAZINE , metoprolol  tartrate, nitroGLYCERIN , ondansetron  **OR** ondansetron  (ZOFRAN ) IV  Allergies:  No Known Allergies  Social History:   Social History   Socioeconomic History   Marital status: Married    Spouse name: [name]   Number of children: 4   Years of education: GED, ITT   Highest education level: Associate degree: occupational, Scientist, product/process development, or vocational program  Occupational History   Occupation: architectural drawing-retired  Tobacco Use   Smoking status: Former    Current packs/day: 0.00    Average packs/day: 1 pack/day for 35.0 years (35.0 ttl pk-yrs)    Types: Cigarettes    Start date: 09/17/1971    Quit date: 09/16/2006    Years since quitting: 17.4   Smokeless tobacco: Never  Vaping Use   Vaping status: Never Used  Substance and Sexual Activity   Alcohol use: No    Alcohol/week: 0.0 standard drinks of alcohol   Drug use: No   Sexual activity: Not Currently  Other Topics Concern   Not on file  Social History Narrative   Lives with wife   Right Handed   Drinks 4-5 cups caffeine daily   Social Drivers of Health   Financial Resource Strain: Low Risk  (05/27/2023)   Overall Financial Resource Strain (CARDIA)    Difficulty of Paying Living Expenses: Not  hard at all  Food Insecurity: No Food Insecurity (05/27/2023)   Hunger Vital Sign    Worried About Running Out of Food in the Last Year: Never true    Ran Out of Food in the Last Year: Never true  Transportation Needs: No Transportation Needs (05/27/2023)   PRAPARE -  Administrator, Civil Service (Medical): No    Lack of Transportation (Non-Medical): No  Physical Activity: Insufficiently Active (05/27/2023)   Exercise Vital Sign    Days of Exercise per Week: 1 day    Minutes of Exercise per Session: 10 min  Stress: No Stress Concern Present (05/27/2023)   [employer] of Occupational Health - Occupational Stress Questionnaire    Feeling of Stress : Not at all  Social Connections: Socially Integrated (05/27/2023)   Social Connection and Isolation Panel    Frequency of Communication with Friends and Family: More than three times a week    Frequency of Social Gatherings with Friends and Family: Never    Attends Religious Services: 1 to 4 times per year    Active Member of [employer] or Organizations: Yes    Attends Banker Meetings: 1 to 4 times per year    Marital Status: Married  Catering manager Violence: Not At Risk (05/12/2023)   Humiliation, Afraid, Rape, and Kick questionnaire    Fear of Current or Ex-Partner: No    Emotionally Abused: No    Physically Abused: No    Sexually Abused: No     Family History:   Family History  Problem Relation Age of Onset   Osteoarthritis Mother    Diabetes Mother    Pulmonary embolism Mother    Heart disease Father        Coronary Artery Disease   Stroke Father    Multiple sclerosis Sister    Factor V Leiden deficiency Sister    Emphysema Sister    Hyperlipidemia Brother    Heart attack Maternal Grandmother    Lung cancer Maternal Grandfather        smoked    ROS:  Review of Systems  Constitutional:  Positive for malaise/fatigue. Negative for chills, diaphoresis, fever and weight loss.  HENT:  Negative for  congestion.   Eyes:  Negative for discharge and redness.  Respiratory:  Positive for shortness of breath. Negative for cough, sputum production and wheezing.   Cardiovascular:  Positive for leg swelling. Negative for chest pain, palpitations, orthopnea, claudication and PND.  Gastrointestinal:  Negative for abdominal pain, heartburn, nausea and vomiting.  Musculoskeletal:  Positive for falls. Negative for myalgias.  Skin:  Negative for rash.  Neurological:  Positive for weakness. Negative for dizziness, tingling, tremors, sensory change, speech change, focal weakness and loss of consciousness.  Endo/Heme/Allergies:  Does not bruise/bleed easily.  Psychiatric/Behavioral:  Negative for substance abuse. The patient is not nervous/anxious.   All other systems reviewed and are negative.     Physical Exam/Data:   Vitals:   03/12/24 1100 03/12/24 1130 03/12/24 1200 03/12/24 1220  BP: (!) 90/56 (!) 95/49 (!) 84/46 (!) 111/58  Pulse: (!) 116 (!) 131 (!) 118 (!) 134  Resp:      Temp:      TempSrc:      SpO2: 96% 96% 95% 95%  Weight:        Intake/Output Summary (Last 24 hours) at 03/12/2024 1322 Last data filed at 03/12/2024 0959 Gross per 24 hour  Intake --  Output 2200 ml  Net -2200 ml   Filed Weights   03/11/24 1332  Weight: (!) 146.1 kg   Body mass index is 47.55 kg/m.   Physical Exam: General: Well developed, well nourished, in no acute distress. Head: Normocephalic, atraumatic, sclera non-icteric, no xanthomas, nares without discharge.  Neck: Negative for carotid bruits. JVD difficult to assess secondary  to body habitus. Lungs: Diminished breath sounds bilaterally. Breathing is unlabored. Heart: Distant heart sounds, RRR with S1 S2. No murmurs, rubs, or gallops appreciated. Abdomen: Soft, non-tender, non-distended with normoactive bowel sounds.  No obvious abdominal masses. Msk:  Strength and tone appear normal for age. Extremities: No clubbing or cyanosis.  Mild bilateral  chronic woody edema with hyperpigmentation noted along the anterior aspect of the lower extremities.  Neuro: Alert and oriented X 3. No facial asymmetry. No focal deficit. Moves all extremities spontaneously. Psych:  Responds to questions appropriately with a normal affect.   EKG:  The EKG was personally reviewed and demonstrates: Sinus tachycardia, 101 bpm, PACs, poor R wave progression along the precordial leads, low voltage Telemetry:  Telemetry was personally reviewed and demonstrates: Sinus rhythm with PACs and short runs of atrial tachycardia with regular R-R intervals  Weights: Filed Weights   03/11/24 1332  Weight: (!) 146.1 kg    Relevant CV Studies:  2D echo 06/10/2020: 1. Left ventricular ejection fraction, by estimation, is 60 to 65%. The  left ventricle has normal function. The left ventricle has no regional  wall motion abnormalities. Left ventricular diastolic parameters are  consistent with Grade I diastolic  dysfunction (impaired relaxation).   2. Right ventricular systolic function is normal. The right ventricular  size is normal.   3. Left atrial size was moderately dilated.   4. The mitral valve is normal in structure. Trivial mitral valve  regurgitation. No evidence of mitral stenosis.   5. The aortic valve is normal in structure. Aortic valve regurgitation is  not visualized. No aortic stenosis is present.   6. The inferior vena cava is normal in size with greater than 50%  respiratory variability, suggesting right atrial pressure of 3 mmHg.  __________  2D echo 10/27/2018: 1. The left ventricle has normal systolic function of 60-65%. The cavity  size was normal. Left ventricular diastolic parameters were normal.   2. The right ventricle has normal systolic function. The cavity was  normal. There is no increase in right ventricular wall thickness. Right  ventricular systolic pressure could not be assessed.   3. The mitral valve is normal in structure.   4.  The tricuspid valve is normal in structure.    Laboratory Data:  Chemistry Recent Labs  Lab 03/11/24 0818 03/12/24 0536  NA 138 140  K 3.1* 3.6  CL 94* 96*  CO2 30 32  GLUCOSE 121* 165*  BUN 19 23  CREATININE 1.43* 1.33*  CALCIUM  8.9 8.4*  GFRNONAA 51* 56*  ANIONGAP 14 12    Recent Labs  Lab 03/11/24 0818  PROT 7.2  ALBUMIN  2.8*  AST 37  ALT 30  ALKPHOS 89  BILITOT 1.1   Hematology Recent Labs  Lab 03/11/24 0818 03/12/24 0536  WBC 18.7* 16.4*  RBC 4.73 4.42  HGB 13.6 12.8*  HCT 42.9 39.9  MCV 90.7 90.3  MCH 28.8 29.0  MCHC 31.7 32.1  RDW 14.5 14.6  PLT 357 356   Cardiac EnzymesNo results for input(s): TROPONINI in the last 168 hours. No results for input(s): TROPIPOC in the last 168 hours.  BNP Recent Labs  Lab 03/11/24 0818  BNP 116.1*    DDimer No results for input(s): DDIMER in the last 168 hours.  Radiology/Studies:  DG Chest 1 View Result Date: 03/11/2024 IMPRESSION: Emphysema.  No acute cardiopulmonary abnormality. Electronically Signed   By: Rogelia Myers M.D.   On: 03/11/2024 08:37    Assessment and Plan:  1.  Atrial tachycardia: - He is having brief paroxysms of narrow complex tachycardia consistent with atrial tachycardia on telemetry with regular RR intervals, attempt to obtain a twelve-lead EKG to capture - No evidence of A-fib on EKG or telemetry.  EKG computer printout is A-fib, though interpretation of this shows sinus tachycardia with PACs - Start short acting diltiazem 30 mg every 8 hours, will need to watch for increase in lower extremity swelling -Continue Toprol -XL 25 mg - Stop Eliquis  - Echo pending - TSH normal - Potassium improved magnesium at goal - No indication for antiarrhythmic therapy at this time  2.  Acute on chronic diastolic CHF: - Likely exacerbated by morbid obesity, untreated sleep apnea, and OHS - Agree with gentle diuresis with close monitoring of renal function and urine output - Defer addition  of SGLT2 inhibitor given morbid obesity with concern for off target effect - If renal function continues to improve, consider addition of MRA  3.  CAD involving the native coronary arteries without angina: - Never with symptoms of chest pain - High-sensitivity troponin negative x 2 - Await echo - No indication for heparin  drip or plans for inpatient ischemic evaluation at this time - Start aspirin  81 mg daily - LDL 67 in 11/2023, previously on rosuvastatin  when he was last seen in our office in 2015 - Now on pravastatin  40 mg  4.  CKD stage II: - Improving with diuresis, monitor  5.  Morbid obesity with untreated OSA and likely OHS: - Weight loss is recommended - Would benefit from healthy weight and wellness center and bariatric evaluation in the outpatient setting - High risk for arrhythmia with body habitus and untreated sleep apnea     For questions or updates, please contact [facility] Please consult [email or web address] for contact info under Cardiology/STEMI.   Signed, Bernardino Bring, PA-C [facility] Pager: [phone] 03/12/2024, 1:22 PM

## 2024-03-12 NOTE — Progress Notes (Signed)
 [facility]  - [place] at [facility]   PATIENT NAME: Alfred Carney    MR#:  [id number]  DATE OF BIRTH:  10-19-1950  SUBJECTIVE:  family at bedside. Patient a very poor historian. Apparently came in after he started feeling weak at home and had mechanical fall without trauma in his garage. Patient has chronic leg edema for many years. He came in with weakness and fatigue ability. He was found to be tachycardic and initially thought to be in a fib. His heart rate fluctuates anywhere from 80-- 140s. Patient wears oxygen  chronically 2 L.    VITALS:  Blood pressure 123/64, pulse 65, temperature 98 F (36.7 C), resp. rate 20, weight (!) 146.1 kg, SpO2 98%.  PHYSICAL EXAMINATION:   GENERAL:  73 y.o.-year-old patient with no acute distress. Morbidly obese LUNGS: distant breath sounds bilaterally, no wheezing CARDIOVASCULAR: S1, S2 normal. No murmur, tachycardia ABDOMEN: Soft, nontender, nondistended. Bowel sounds present. Abdominal obesity EXTREMITIES: chronic edema b/l.    NEUROLOGIC: nonfocal  patient is alert and awake.   LABORATORY PANEL:  CBC Recent Labs  Lab 03/12/24 0536  WBC 16.4*  HGB 12.8*  HCT 39.9  PLT 356    Chemistries  Recent Labs  Lab 03/11/24 0818 03/12/24 0536  NA 138 140  K 3.1* 3.6  CL 94* 96*  CO2 30 32  GLUCOSE 121* 165*  BUN 19 23  CREATININE 1.43* 1.33*  CALCIUM  8.9 8.4*  MG 2.1  --   AST 37  --   ALT 30  --   ALKPHOS 89  --   BILITOT 1.1  --    Cardiac Enzymes No results for input(s): TROPONINI in the last 168 hours. RADIOLOGY:  ECHOCARDIOGRAM COMPLETE Result Date: 03/12/2024    ECHOCARDIOGRAM REPORT   Patient Name:   Alfred Carney Date of Exam: 03/12/2024 Medical Rec #:  [id number]    Height:       69.0 in Accession #:    [id number]   Weight:       322.0 lb Date of Birth:  24-Nov-1950     BSA:          2.529 m Patient Age:    73 years     BP:           102/85 mmHg Patient Gender: M            HR:           93 bpm. Exam Location:   [facility] Procedure: 2D Echo, Cardiac Doppler, Color Doppler and Intracardiac            Opacification Agent (Both Spectral and Color Flow Doppler were            utilized during procedure). Indications:     Dyspnea R06.00                  Atrial Fibrillation I48.91  History:         Patient has prior history of Echocardiogram examinations, most                  recent 06/10/2020. Signs/Symptoms:Shortness of Breath.  Sonographer:     Ashley McNeely-Sloane Referring Phys:  [id number] AMY N COX Diagnosing Phys: Deatrice Cage MD IMPRESSIONS  1. Left ventricular ejection fraction, by estimation, is 55 to 60%. The left ventricle has normal function. Left ventricular endocardial border not optimally defined to evaluate regional wall motion. Left ventricular diastolic parameters are consistent with Grade I diastolic dysfunction (impaired  relaxation).  2. Right ventricular systolic function is normal. The right ventricular size is mildly enlarged. Tricuspid regurgitation signal is inadequate for assessing PA pressure.  3. The mitral valve was not well visualized. No evidence of mitral valve regurgitation. No evidence of mitral stenosis.  4. The aortic valve was not well visualized. Aortic valve regurgitation is not visualized. No aortic stenosis is present.  5. Challenging image quality. FINDINGS  Left Ventricle: Left ventricular ejection fraction, by estimation, is 55 to 60%. The left ventricle has normal function. Left ventricular endocardial border not optimally defined to evaluate regional wall motion. Definity  contrast agent was given IV to delineate the left ventricular endocardial borders. The left ventricular internal cavity size was normal in size. There is no left ventricular hypertrophy. Left ventricular diastolic parameters are consistent with Grade I diastolic dysfunction (impaired relaxation). Right Ventricle: The right ventricular size is mildly enlarged. No increase in right ventricular wall thickness. Right  ventricular systolic function is normal. Tricuspid regurgitation signal is inadequate for assessing PA pressure. Left Atrium: Left atrial size was normal in size. Right Atrium: Right atrial size was normal in size. Pericardium: There is no evidence of pericardial effusion. Mitral Valve: The mitral valve was not well visualized. No evidence of mitral valve regurgitation. No evidence of mitral valve stenosis. MV peak gradient, 4.8 mmHg. The mean mitral valve gradient is 2.0 mmHg. Tricuspid Valve: The tricuspid valve is not well visualized. Tricuspid valve regurgitation is not demonstrated. No evidence of tricuspid stenosis. Aortic Valve: The aortic valve was not well visualized. Aortic valve regurgitation is not visualized. No aortic stenosis is present. Aortic valve mean gradient measures 3.0 mmHg. Aortic valve peak gradient measures 5.7 mmHg. Aortic valve area, by VTI measures 2.25 cm. Pulmonic Valve: The pulmonic valve was not well visualized. Pulmonic valve regurgitation is not visualized. No evidence of pulmonic stenosis. Aorta: The aortic root is normal in size and structure. Venous: The inferior vena cava was not well visualized. IAS/Shunts: No atrial level shunt detected by color flow Doppler.  LEFT VENTRICLE PLAX 2D LVOT diam:     1.90 cm     Diastology LV SV:         45          LV e' medial:    4.03 cm/s LV SV Index:   18          LV E/e' medial:  14.0 LVOT Area:     2.84 cm    LV e' lateral:   8.38 cm/s                            LV E/e' lateral: 6.8  LV Volumes (MOD) LV vol d, MOD A2C: 55.7 ml LV vol d, MOD A4C: 51.6 ml LV vol s, MOD A2C: 35.7 ml LV vol s, MOD A4C: 28.5 ml LV SV MOD A2C:     20.0 ml LV SV MOD A4C:     51.6 ml LV SV MOD BP:      19.6 ml RIGHT VENTRICLE RV Basal diam:  4.50 cm RV S prime:     12.90 cm/s TAPSE (M-mode): 1.6 cm LEFT ATRIUM           Index       RIGHT ATRIUM           Index LA Vol (A4C): 22.8 ml 9.01 ml/m  RA Area:     15.30 cm  RA Volume:    39.80 ml  15.73 ml/m  AORTIC VALVE AV Area (Vmax):    2.25 cm AV Area (Vmean):   2.17 cm AV Area (VTI):     2.25 cm AV Vmax:           119.00 cm/s AV Vmean:          80.100 cm/s AV VTI:            0.199 m AV Peak Grad:      5.7 mmHg AV Mean Grad:      3.0 mmHg LVOT Vmax:         94.40 cm/s LVOT Vmean:        61.200 cm/s LVOT VTI:          0.158 m LVOT/AV VTI ratio: 0.79  AORTA Ao Root diam: 3.50 cm MITRAL VALVE MV Area (PHT): 3.31 cm     SHUNTS MV Area VTI:   2.01 cm     Systemic VTI:  0.16 m MV Peak grad:  4.8 mmHg     Systemic Diam: 1.90 cm MV Mean grad:  2.0 mmHg MV Vmax:       1.09 m/s MV Vmean:      72.2 cm/s MV Decel Time: 229 msec MV E velocity: 56.60 cm/s MV A velocity: 101.00 cm/s MV E/A ratio:  0.56 Deatrice Cage MD Electronically signed by Deatrice Cage MD Signature Date/Time: 03/12/2024/3:36:07 PM    Final    DG Chest 1 View Result Date: 03/11/2024 CLINICAL DATA:  sob EXAM: CHEST  1 VIEW COMPARISON:  November 19, 2017 FINDINGS: Biapical pleural thickening. Emphysema. Unchanged chronic blunting of the left costophrenic sulcus. No new airspace consolidation or pleural effusion. Mild cardiomegaly. Tortuous aorta with aortic atherosclerosis. No acute fracture or destructive lesions. Multilevel thoracic osteophytosis. IMPRESSION: Emphysema.  No acute cardiopulmonary abnormality. Electronically Signed   By: Rogelia Myers M.D.   On: 03/11/2024 08:37    Assessment and Plan Britton Bera is a 73 year old male with history of non-insulin -dependent diabetes mellitus, PAD, hyperlipidemia, hypertension, history of gout, BPH, COPD on chronic 2 L nasal cannula at baseline, history of heart failure preserved ejection fraction, who presents emergency department for chief concerns of weakness for 10 days.   Paroxysmal atrial tachycardia acute diastolic congestive heart failure -- initial EKG showed atrial fibrillation however rhythm strip evaluated by cardiology shows PACs with sinus tachycardia -- will  continue Cardizem 30 mg Q8 and PRN IV metoprolol  -- echo of the heart -- [facility] cardiology input noted. Appears secondary to untreated sleep apnea/obesity hypoventilation syndrome -- IV Lasix   Leukocytosis possible UTI -- patient not able to give any symptoms. However has abnormal UA and with weakness will treat for three doses of Rocephin . Urine culture ordered.  Chronic respiratory failure with chronic emphysema and untreated sleep apnea -- patient wears 2 L chronically oxygen  -- PRN nebulizer -- recommend reach out to pulmonary and see if different mask or nasal tubing can be used for CPAP.  Generalized weakness deconditioning/recent fall -- will get PT/OT to see  Type II diabetes with hyperglycemia -- will resume glipizide  -- continue sliding scale  Hyperlipidemia -- statins    Procedures: Family communication : family in the ER Consults : [facility] cardiology CODE STATUS: full DVT Prophylaxis : Lovenox  Level of care: Telemetry Cardiac Status is: Inpatient Remains inpatient appropriate because: CHF, tachycardia    TOTAL TIME TAKING CARE OF THIS PATIENT: 45 minutes.  >50% time spent on counselling and coordination  of care  Note: This dictation was prepared with Dragon dictation along with smaller phrase technology. Any transcriptional errors that result from this process are unintentional.  Leita Blanch M.D    [facility]   CC: Primary care physician; Corwin Antu, FNP

## 2024-03-12 NOTE — ED Notes (Signed)
Pt daughter updated on pt plan of care

## 2024-03-12 NOTE — ED Notes (Signed)
 Pt given breakfast tray

## 2024-03-13 DIAGNOSIS — I471 Supraventricular tachycardia, unspecified: Secondary | ICD-10-CM | POA: Diagnosis not present

## 2024-03-13 DIAGNOSIS — I4719 Other supraventricular tachycardia: Secondary | ICD-10-CM | POA: Insufficient documentation

## 2024-03-13 DIAGNOSIS — I509 Heart failure, unspecified: Secondary | ICD-10-CM | POA: Diagnosis not present

## 2024-03-13 LAB — BASIC METABOLIC PANEL WITH GFR
Anion gap: 11 (ref 5–15)
BUN: 23 mg/dL (ref 8–23)
CO2: 33 mmol/L — ABNORMAL HIGH (ref 22–32)
Calcium: 8.4 mg/dL — ABNORMAL LOW (ref 8.9–10.3)
Chloride: 94 mmol/L — ABNORMAL LOW (ref 98–111)
Creatinine, Ser: 1.28 mg/dL — ABNORMAL HIGH (ref 0.61–1.24)
GFR, Estimated: 59 mL/min — ABNORMAL LOW (ref >60)
Glucose, Bld: 168 mg/dL — ABNORMAL HIGH (ref 70–99)
Potassium: 3.5 mmol/L (ref 3.5–5.1)
Sodium: 138 mmol/L (ref 135–145)

## 2024-03-13 LAB — GLUCOSE, CAPILLARY
Glucose-Capillary: 148 mg/dL — ABNORMAL HIGH (ref 70–99)
Glucose-Capillary: 156 mg/dL — ABNORMAL HIGH (ref 70–99)
Glucose-Capillary: 160 mg/dL — ABNORMAL HIGH (ref 70–99)
Glucose-Capillary: 233 mg/dL — ABNORMAL HIGH (ref 70–99)

## 2024-03-13 LAB — URIC ACID: Uric Acid, Serum: 5.5 mg/dL (ref 3.7–8.6)

## 2024-03-13 MED ORDER — TRAMADOL HCL 50 MG PO TABS
50.0000 mg | ORAL_TABLET | Freq: Four times a day (QID) | ORAL | Status: DC | PRN
  Administered 2024-03-13 – 2024-03-15 (×4): 50 mg via ORAL
  Filled 2024-03-13 (×4): qty 1

## 2024-03-13 MED ORDER — DILTIAZEM HCL 30 MG PO TABS
30.0000 mg | ORAL_TABLET | Freq: Four times a day (QID) | ORAL | Status: DC
Start: 2024-03-13 — End: 2024-03-14
  Administered 2024-03-13 – 2024-03-14 (×4): 30 mg via ORAL
  Filled 2024-03-13 (×4): qty 1

## 2024-03-13 MED ORDER — ENSURE PLUS HIGH PROTEIN PO LIQD
237.0000 mL | Freq: Two times a day (BID) | ORAL | Status: DC
  Administered 2024-03-13 – 2024-03-18 (×12): 237 mL via ORAL

## 2024-03-13 NOTE — Progress Notes (Signed)
 Progress Note  Patient Name: Alfred Carney Date of Encounter: 03/13/2024  Primary Cardiologist: New - consult by Darron, MD   Subjective   Chronic dyspnea is unchanged. No chest pain, palpitations, dizziness, presyncope, or syncope. Continues to have brief paroxysms of atrial tachycardia into the 120s to 140s bpm, lasting a few seconds in duration. Documented UOP 1.3 L for the past 24 hours, net - 3.7 L for the admission. Weight unchanged at 127.7 kg. Labs pending this morning.   Inpatient Medications    Scheduled Meds:  allopurinol   200 mg Oral Daily   cholecalciferol   1,000 Units Oral Daily   diltiazem  30 mg Oral Q6H   enoxaparin  (LOVENOX ) injection  70 mg Subcutaneous Q24H   insulin  aspart  0-15 Units Subcutaneous TID WC   insulin  aspart  0-5 Units Subcutaneous QHS   irbesartan   300 mg Oral Daily   lidocaine   1 patch Transdermal Q24H   pravastatin   40 mg Oral q1800   tamsulosin   0.4 mg Oral QHS   Continuous Infusions:  cefTRIAXone  (ROCEPHIN )  IV Stopped (03/12/24 1034)   PRN Meds: acetaminophen  **OR** acetaminophen , albuterol , calcium  carbonate **AND** simethicone , metoprolol  tartrate, nitroGLYCERIN , ondansetron  **OR** ondansetron  (ZOFRAN ) IV   Vital Signs    Vitals:   03/12/24 2010 03/12/24 2347 03/13/24 0430 03/13/24 0500  BP: (!) 133/49 123/64 119/67   Pulse: 63 69 90   Resp: 18 18 20    Temp: 98.3 F (36.8 C) (!) 97.1 F (36.2 C) 97.8 F (36.6 C)   TempSrc: Oral  Oral   SpO2: 96% 100% 95%   Weight:    127.7 kg  Height:        Intake/Output Summary (Last 24 hours) at 03/13/2024 0808 Last data filed at 03/13/2024 0600 Gross per 24 hour  Intake 280 ml  Output 1600 ml  Net -1320 ml   Filed Weights   03/11/24 1332 03/12/24 1706 03/13/24 0500  Weight: (!) 146.1 kg 127.7 kg 127.7 kg    Telemetry    SR with brief episodes of atrial tachycardia into the 120s to 140s bpm, lasting a few seconds in duration - Personally Reviewed  ECG    No new tracings -  Personally Reviewed  Physical Exam   GEN: No acute distress.   Neck: JVD difficult to assess secondary to body habitus. Cardiac: Distant, RRR, no murmurs, rubs, or gallops.  Respiratory: Diminished breath sounds bilaterally.  GI: Soft, nontender, non-distended.   MS: Moderate bilateral lower extremity edema with chronic stasis dermatitis; No deformity. Neuro:  Alert and oriented x 3; Nonfocal.  Psych: Normal affect.  Labs    Chemistry Recent Labs  Lab 03/11/24 0818 03/12/24 0536  NA 138 140  K 3.1* 3.6  CL 94* 96*  CO2 30 32  GLUCOSE 121* 165*  BUN 19 23  CREATININE 1.43* 1.33*  CALCIUM  8.9 8.4*  PROT 7.2  --   ALBUMIN  2.8*  --   AST 37  --   ALT 30  --   ALKPHOS 89  --   BILITOT 1.1  --   GFRNONAA 51* 56*  ANIONGAP 14 12     Hematology Recent Labs  Lab 03/11/24 0818 03/12/24 0536  WBC 18.7* 16.4*  RBC 4.73 4.42  HGB 13.6 12.8*  HCT 42.9 39.9  MCV 90.7 90.3  MCH 28.8 29.0  MCHC 31.7 32.1  RDW 14.5 14.6  PLT 357 356    Cardiac EnzymesNo results for input(s): TROPONINI in the last 168 hours. No  results for input(s): TROPIPOC in the last 168 hours.   BNP Recent Labs  Lab 03/11/24 0818  BNP 116.1*     DDimer No results for input(s): DDIMER in the last 168 hours.   Radiology    DG Chest 1 View Result Date: 03/11/2024 IMPRESSION: Emphysema.  No acute cardiopulmonary abnormality. Electronically Signed   By: Rogelia Myers M.D.   On: 03/11/2024 08:37    Cardiac Studies   2D echo 03/12/2024: 1. Left ventricular ejection fraction, by estimation, is 55 to 60%. The  left ventricle has normal function. Left ventricular endocardial border  not optimally defined to evaluate regional wall motion. Left ventricular  diastolic parameters are consistent  with Grade I diastolic dysfunction (impaired relaxation).   2. Right ventricular systolic function is normal. The right ventricular  size is mildly enlarged. Tricuspid regurgitation signal is  inadequate for  assessing PA pressure.   3. The mitral valve was not well visualized. No evidence of mitral valve  regurgitation. No evidence of mitral stenosis.   4. The aortic valve was not well visualized. Aortic valve regurgitation  is not visualized. No aortic stenosis is present.   5. Challenging image quality.  __________  2D echo 06/10/2020: 1. Left ventricular ejection fraction, by estimation, is 60 to 65%. The  left ventricle has normal function. The left ventricle has no regional  wall motion abnormalities. Left ventricular diastolic parameters are  consistent with Grade I diastolic  dysfunction (impaired relaxation).   2. Right ventricular systolic function is normal. The right ventricular  size is normal.   3. Left atrial size was moderately dilated.   4. The mitral valve is normal in structure. Trivial mitral valve  regurgitation. No evidence of mitral stenosis.   5. The aortic valve is normal in structure. Aortic valve regurgitation is  not visualized. No aortic stenosis is present.   6. The inferior vena cava is normal in size with greater than 50%  respiratory variability, suggesting right atrial pressure of 3 mmHg.  __________   2D echo 10/27/2018: 1. The left ventricle has normal systolic function of 60-65%. The cavity  size was normal. Left ventricular diastolic parameters were normal.   2. The right ventricle has normal systolic function. The cavity was  normal. There is no increase in right ventricular wall thickness. Right  ventricular systolic pressure could not be assessed.   3. The mitral valve is normal in structure.   4. The tricuspid valve is normal in structure.  Patient Profile     73 y.o. 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 Tobie.   Assessment & Plan    1. Atrial tachycardia: - He is having brief paroxysms of narrow complex tachycardia consistent with atrial tachycardia on telemetry with regular RR intervals - No evidence of A-fib on EKG or telemetry.  EKG computer printout is A-fib, though interpretation of this shows sinus tachycardia with PACs - Likely in the setting of untreated sleep apnea - Continues to have brief paroxysms of atrial tachycardia lasting a few seconds - Increase short acting diltiazem to 30 mg every 6 hours, will need to watch for increase in lower extremity swelling - Continue Toprol -XL 25 mg - Eliquis  stopped on 6/27  given no evidence of Afib - Echo without significant structural abnormality  - TSH normal - Potassium improved magnesium at goal - No indication for antiarrhythmic therapy at this time   2.  Acute on chronic diastolic CHF: - Likely exacerbated by morbid obesity, untreated sleep apnea, and OHS - Agree with gentle diuresis with close monitoring of renal function and urine output - Defer addition of SGLT2 inhibitor given morbid obesity with concern for off target effect - If renal function continues to improve, consider addition of MRA   3.  CAD involving the native coronary arteries without angina: - Never with symptoms of chest pain - High-sensitivity troponin negative x 2 - Await echo - No indication for heparin  drip or plans for inpatient ischemic evaluation at this time - Start aspirin  81 mg daily - LDL 67 in 11/2023, previously on rosuvastatin  when he was last seen in our office in 2015 - Now on pravastatin  40 mg   4.  CKD stage II: - Labs pending this morning - Improving with diuresis at time of consult, monitor   5.  Morbid obesity with untreated OSA and likely OHS: - Did not tolerate CPAP, will need sleep medicine follow up - Weight loss is recommended - Would benefit from healthy weight  and wellness center and bariatric evaluation in the outpatient setting - High risk for arrhythmia with body habitus and untreated sleep apnea      For questions or updates, please contact [facility] Please consult [email or web address] for contact info under Cardiology/STEMI.    Signed, Bernardino Bring, PA-C [facility] Pager: [phone] 03/13/2024, 8:08 AM

## 2024-03-13 NOTE — Progress Notes (Signed)
 [facility]  - [place] [facility]   PATIENT NAME: Alfred Carney    MR#:  [id number]  DATE OF BIRTH:  [birth year]  SUBJECTIVE:  No family at bedside. Patient a very poor historian.  Heartrate much improved. Although intermittently atrial tachycardia eliquis  with exertion. Patient complains of foot pain.   VITALS:  Blood pressure 131/60, pulse 85, temperature 98.1 F (36.7 C), temperature source Oral, resp. rate 16, height 5' 9.5 (1.765 m), weight 127.7 kg, SpO2 96%.  PHYSICAL EXAMINATION:   GENERAL:  73 y.o.-year-old patient with no acute distress. Morbidly obese LUNGS: distant breath sounds bilaterally, no wheezing CARDIOVASCULAR: S1, S2 normal. No murmur, tachycardia ABDOMEN: Soft, nontender, nondistended. Bowel sounds present. Abdominal obesity EXTREMITIES: chronic edema b/l.    NEUROLOGIC: nonfocal  patient is alert and awake.   LABORATORY PANEL:  CBC Recent Labs  Lab 03/12/24 0536  WBC 16.4*  HGB 12.8*  HCT 39.9  PLT 356    Chemistries  Recent Labs  Lab 03/11/24 0818 03/12/24 0536 03/13/24 0836  NA 138   < > 138  K 3.1*   < > 3.5  CL 94*   < > 94*  CO2 30   < > 33*  GLUCOSE 121*   < > 168*  BUN 19   < > 23  CREATININE 1.43*   < > 1.28*  CALCIUM  8.9   < > 8.4*  MG 2.1  --   --   AST 37  --   --   ALT 30  --   --   ALKPHOS 89  --   --   BILITOT 1.1  --   --    < > = values in this interval not displayed.   Cardiac Enzymes No results for input(s): TROPONINI in the last 168 hours. RADIOLOGY:  ECHOCARDIOGRAM COMPLETE Result Date: 03/12/2024    ECHOCARDIOGRAM REPORT   Patient Name:   Alfred Carney Date of Exam: 03/12/2024 Medical Rec #:  [id number]    Height:       69.0 in Accession #:    [id number]   Weight:       322.0 lb Date of Birth:  Jun 04, 1951     BSA:          2.529 m Patient Age:    73 years     BP:           102/85 mmHg Patient Gender: M            HR:           93 bpm. Exam Location:  [facility] Procedure: 2D Echo, Cardiac Doppler, Color  Doppler and Intracardiac            Opacification Agent (Both Spectral and Color Flow Doppler were            utilized during procedure). Indications:     Dyspnea R06.00                  Atrial Fibrillation I48.91  History:         Patient has prior history of Echocardiogram examinations, most                  recent 06/10/2020. Signs/Symptoms:Shortness of Breath.  Sonographer:     Ashley McNeely-Sloane Referring Phys:  [id number] AMY N COX Diagnosing Phys: Deatrice Cage MD IMPRESSIONS  1. Left ventricular ejection fraction, by estimation, is 55 to 60%. The left ventricle has normal function. Left ventricular endocardial border  not optimally defined to evaluate regional wall motion. Left ventricular diastolic parameters are consistent with Grade I diastolic dysfunction (impaired relaxation).  2. Right ventricular systolic function is normal. The right ventricular size is mildly enlarged. Tricuspid regurgitation signal is inadequate for assessing PA pressure.  3. The mitral valve was not well visualized. No evidence of mitral valve regurgitation. No evidence of mitral stenosis.  4. The aortic valve was not well visualized. Aortic valve regurgitation is not visualized. No aortic stenosis is present.  5. Challenging image quality. FINDINGS  Left Ventricle: Left ventricular ejection fraction, by estimation, is 55 to 60%. The left ventricle has normal function. Left ventricular endocardial border not optimally defined to evaluate regional wall motion. Definity  contrast agent was given IV to delineate the left ventricular endocardial borders. The left ventricular internal cavity size was normal in size. There is no left ventricular hypertrophy. Left ventricular diastolic parameters are consistent with Grade I diastolic dysfunction (impaired relaxation). Right Ventricle: The right ventricular size is mildly enlarged. No increase in right ventricular wall thickness. Right ventricular systolic function is normal. Tricuspid  regurgitation signal is inadequate for assessing PA pressure. Left Atrium: Left atrial size was normal in size. Right Atrium: Right atrial size was normal in size. Pericardium: There is no evidence of pericardial effusion. Mitral Valve: The mitral valve was not well visualized. No evidence of mitral valve regurgitation. No evidence of mitral valve stenosis. MV peak gradient, 4.8 mmHg. The mean mitral valve gradient is 2.0 mmHg. Tricuspid Valve: The tricuspid valve is not well visualized. Tricuspid valve regurgitation is not demonstrated. No evidence of tricuspid stenosis. Aortic Valve: The aortic valve was not well visualized. Aortic valve regurgitation is not visualized. No aortic stenosis is present. Aortic valve mean gradient measures 3.0 mmHg. Aortic valve peak gradient measures 5.7 mmHg. Aortic valve area, by VTI measures 2.25 cm. Pulmonic Valve: The pulmonic valve was not well visualized. Pulmonic valve regurgitation is not visualized. No evidence of pulmonic stenosis. Aorta: The aortic root is normal in size and structure. Venous: The inferior vena cava was not well visualized. IAS/Shunts: No atrial level shunt detected by color flow Doppler.  LEFT VENTRICLE PLAX 2D LVOT diam:     1.90 cm     Diastology LV SV:         45          LV e' medial:    4.03 cm/s LV SV Index:   18          LV E/e' medial:  14.0 LVOT Area:     2.84 cm    LV e' lateral:   8.38 cm/s                            LV E/e' lateral: 6.8  LV Volumes (MOD) LV vol d, MOD A2C: 55.7 ml LV vol d, MOD A4C: 51.6 ml LV vol s, MOD A2C: 35.7 ml LV vol s, MOD A4C: 28.5 ml LV SV MOD A2C:     20.0 ml LV SV MOD A4C:     51.6 ml LV SV MOD BP:      19.6 ml RIGHT VENTRICLE RV Basal diam:  4.50 cm RV S prime:     12.90 cm/s TAPSE (M-mode): 1.6 cm LEFT ATRIUM           Index       RIGHT ATRIUM           Index LA Vol (  A4C): 22.8 ml 9.01 ml/m  RA Area:     15.30 cm                                   RA Volume:   39.80 ml  15.73 ml/m  AORTIC VALVE AV Area  (Vmax):    2.25 cm AV Area (Vmean):   2.17 cm AV Area (VTI):     2.25 cm AV Vmax:           119.00 cm/s AV Vmean:          80.100 cm/s AV VTI:            0.199 m AV Peak Grad:      5.7 mmHg AV Mean Grad:      3.0 mmHg LVOT Vmax:         94.40 cm/s LVOT Vmean:        61.200 cm/s LVOT VTI:          0.158 m LVOT/AV VTI ratio: 0.79  AORTA Ao Root diam: 3.50 cm MITRAL VALVE MV Area (PHT): 3.31 cm     SHUNTS MV Area VTI:   2.01 cm     Systemic VTI:  0.16 m MV Peak grad:  4.8 mmHg     Systemic Diam: 1.90 cm MV Mean grad:  2.0 mmHg MV Vmax:       1.09 m/s MV Vmean:      72.2 cm/s MV Decel Time: 229 msec MV E velocity: 56.60 cm/s MV A velocity: 101.00 cm/s MV E/A ratio:  0.56 Deatrice Cage MD Electronically signed by Deatrice Cage MD Signature Date/Time: 03/12/2024/3:36:07 PM    Final     Assessment and Plan Emidio Warrell is a 73 year old male with history of non-insulin -dependent diabetes mellitus, PAD, hyperlipidemia, hypertension, history of gout, BPH, COPD on chronic 2 L nasal cannula at baseline, history of heart failure preserved ejection fraction, who presents emergency department for chief concerns of weakness for 10 days.   Paroxysmal atrial tachycardia acute diastolic congestive heart failure -- initial EKG showed atrial fibrillation however rhythm strip evaluated by cardiology shows PACs with sinus tachycardia -- will continue Cardizem 30 mg Q8 and PRN IV metoprolol  -- echo of the heart -- [facility] cardiology input noted. Appears secondary to untreated sleep apnea/obesity hypoventilation syndrome -- IV Lasix -- with good urine output  Leukocytosis possible UTI -- patient not able to give any symptoms. However has abnormal UA and with weakness will treat for three doses of Rocephin . Urine culture ordered.  Chronic respiratory failure with chronic emphysema and untreated sleep apnea -- patient wears 2 L chronically oxygen  -- PRN nebulizer -- recommend reach out to pulmonary and see if different  mask or nasal tubing can be used for CPAP.  Generalized weakness deconditioning/recent fall -- will get PT/OT to see  Type II diabetes with hyperglycemia -- will resume glipizide  -- continue sliding scale  Hyperlipidemia -- statins  Bilateral ankle -- patient tells me he has history of gout. Already on allopurinol  -- will give PRN tramadol -- check uric acid  Procedures: Family communication : family in the ER Consults : [facility] cardiology CODE STATUS: full DVT Prophylaxis : Lovenox  Level of care: Telemetry Cardiac Status is: Inpatient Remains inpatient appropriate because: CHF, tachycardia    TOTAL TIME TAKING CARE OF THIS PATIENT: 45 minutes.  >50% time spent on counselling and coordination of care  Note: This dictation  was prepared with Dragon dictation along with smaller phrase technology. Any transcriptional errors that result from this process are unintentional.  Leita Blanch M.D    [facility]   CC: Primary care physician; Corwin Antu, FNP

## 2024-03-13 NOTE — Evaluation (Signed)
 Occupational Therapy Evaluation Patient Details Name: Tulio Facundo MRN: [id number] DOB: 1951-01-09 Today's Date: 03/13/2024   History of Present Illness   73-year-old male with history of non-insulin -dependent diabetes mellitus, PAD, hyperlipidemia, hypertension, history of gout, BPH, COPD on chronic 2 L nasal cannula at baseline, history of heart failure preserved ejection fraction, who presents emergency department for chief concerns of weakness for 10 days.     Clinical Impressions Patient presenting with decreased Ind in self care,balance, endurance, and safety awareness. Patient reports being Mod I at home and living with wife. Pt sleeps in recliner chair and ambulates short distance with RW to bathroom. 2Ls O2 via [place] at home but currently on 4Ls.  Patient currently able to perform supine >sit with min A. Pt sits on EOB with supervision. Attempts to stand from EOB with RW but pt unable to clear buttocks. B ankle pain limiting pt as well. Lateral scoots to get further up in bed with max A.   Patient will benefit from acute OT to increase overall independence in the areas of ADLs, functional mobility, and safety awareness in order to safely discharge.     If plan is discharge home, recommend the following:   Two people to help with walking and/or transfers;A lot of help with bathing/dressing/bathroom;Help with stairs or ramp for entrance;Assist for transportation;Assistance with cooking/housework     Functional Status Assessment   Patient has had a recent decline in their functional status and demonstrates the ability to make significant improvements in function in a reasonable and predictable amount of time.     Equipment Recommendations   Other (comment) (defer to next venue of care)      Precautions/Restrictions   Precautions Precautions: Fall     Mobility Bed Mobility Overal bed mobility: Needs Assistance Bed Mobility: Supine to Sit, Sit to Supine     Supine to  sit: Min assist Sit to supine: Mod assist        Transfers Overall transfer level: Needs assistance Equipment used: 1 person hand held assist, Rolling walker (2 wheels) Transfers: Sit to/from Stand Sit to Stand: From elevated surface           General transfer comment: pt unable to stand from EOB with RW and laterally scoots along EOB max A.      Balance Overall balance assessment: Needs assistance Sitting-balance support: Feet supported Sitting balance-Leahy Scale: Good                                     ADL either performed or assessed with clinical judgement   ADL Overall ADL's : Needs assistance/impaired     Grooming: Wash/dry hands;Wash/dry face;Sitting;Set up;Supervision/safety                                       Vision Patient Visual Report: No change from baseline              Pertinent Vitals/Pain Pain Assessment Pain Assessment: Faces Faces Pain Scale: Hurts whole lot Pain Location: B ankles Pain Descriptors / Indicators: Aching, Discomfort Pain Intervention(s): Limited activity within patient's tolerance, Premedicated before session, Repositioned     Extremity/Trunk Assessment Upper Extremity Assessment Upper Extremity Assessment: Generalized weakness   Lower Extremity Assessment Lower Extremity Assessment: Generalized weakness       Communication Communication Communication: Impaired Factors Affecting  Communication: Hearing impaired   Cognition Arousal: Alert Behavior During Therapy: WFL for tasks assessed/performed Cognition: No apparent impairments                               Following commands: Intact       Cueing  General Comments   Cueing Techniques: Verbal cues;Gestural cues;Tactile cues              Home Living Family/patient expects to be discharged to:: Private residence Living Arrangements: Spouse/significant other Available Help at Discharge: Family;Available  PRN/intermittently Type of Home: House Home Access: Stairs to enter [employer] of Steps: 3 Entrance Stairs-Rails: Left;Right;Can reach both Home Layout: One level     Bathroom Shower/Tub: Producer, television/film/video: Handicapped height Bathroom Accessibility: Yes   Home Equipment: Agricultural consultant (2 wheels);Rollator (4 wheels);Cane - single point;Shower seat;Grab bars - tub/shower          Prior Functioning/Environment Prior Level of Function : Independent/Modified Independent               ADLs Comments: Pt reports sleeping in recliner chair and only ambulating short distances to bathroom with RW for toileting and shower. Wife performs all IADLs.    OT Problem List: Decreased strength;Impaired balance (sitting and/or standing);Pain;Decreased safety awareness;Increased edema;Decreased activity tolerance;Decreased knowledge of use of DME or AE   OT Treatment/Interventions: Self-care/ADL training;Therapeutic exercise;Balance training;Energy conservation;Therapeutic activities;DME and/or AE instruction      OT Goals(Current goals can be found in the care plan section)   Acute Rehab OT Goals Patient Stated Goal: to decrease pain and go home OT Goal Formulation: With patient Time For Goal Achievement: 03/27/24 Potential to Achieve Goals: Fair ADL Goals Pt Will Perform Grooming: with modified independence;sitting Pt Will Perform Lower Body Dressing: with min assist;sitting/lateral leans Pt Will Transfer to Toilet: with min assist;bedside commode;stand pivot transfer Pt Will Perform Toileting - Clothing Manipulation and hygiene: with min assist;sit to/from stand   OT Frequency:  Min 2X/week       AM-PAC OT 6 Clicks Daily Activity     Outcome Measure Help from another person eating meals?: None Help from another person taking care of personal grooming?: A Little Help from another person toileting, which includes using toliet, bedpan, or urinal?:  Total Help from another person bathing (including washing, rinsing, drying)?: Total Help from another person to put on and taking off regular upper body clothing?: A Little Help from another person to put on and taking off regular lower body clothing?: Total 6 Click Score: 13   End of Session Equipment Utilized During Treatment: Rolling walker (2 wheels);Oxygen  Nurse Communication: Mobility status  Activity Tolerance: Patient limited by pain Patient left: in bed;with call bell/phone within reach;with bed alarm set  OT Visit Diagnosis: Unsteadiness on feet (R26.81);Repeated falls (R29.6);History of falling (Z91.81)                Time: 8984-8963 OT Time Calculation (min): 21 min Charges:  OT General Charges $OT Visit: 1 Visit OT Evaluation $OT Eval Moderate Complexity: 1 Mod OT Treatments $Therapeutic Activity: 8-22 mins  Izetta Claude, MS, OTR/L , CBIS ascom [phone]  03/13/24, 12:50 PM

## 2024-03-13 NOTE — Care Management Important Message (Signed)
 Important Message  Patient Details  Name: Alfred Carney MRN: [id number] Date of Birth: 21-Dec-1950   Important Message Given:  Yes - Medicare IM     Yulia Ulrich W, CMA 03/13/2024, 11:43 AM

## 2024-03-13 NOTE — Progress Notes (Signed)
 PT Cancellation Note  Patient Details Name: Alfred Carney MRN: [id number] DOB: 10-27-1950   Cancelled Treatment:    Reason Eval/Treat Not Completed: Pain limiting ability to participate. PT orders received and pt chart reviewed. Pt supine in bed with grandchildren at bedside. Pt declining participation with PT eval due to 6/10 pain in bil feet from gout. Per chart, pt had Tylenol  at 1124 today and feels that it helped a little with pain. Pt unable to tolerate standing during OT eval this morning secondary to pain in bil feet. He may benefit from adjusted pain regimen for optimal improved pain tolerance and optimal participation with therapy services. Care team notified. Will follow up with PT eval as appropriate.    Camie CHARLENA Kluver, PT, DPT 2:05 PM,03/13/24 Physical Therapist - [facility]

## 2024-03-14 DIAGNOSIS — I471 Supraventricular tachycardia, unspecified: Secondary | ICD-10-CM | POA: Diagnosis not present

## 2024-03-14 DIAGNOSIS — R531 Weakness: Principal | ICD-10-CM

## 2024-03-14 DIAGNOSIS — I2510 Atherosclerotic heart disease of native coronary artery without angina pectoris: Secondary | ICD-10-CM | POA: Diagnosis not present

## 2024-03-14 LAB — GLUCOSE, CAPILLARY
Glucose-Capillary: 141 mg/dL — ABNORMAL HIGH (ref 70–99)
Glucose-Capillary: 191 mg/dL — ABNORMAL HIGH (ref 70–99)
Glucose-Capillary: 203 mg/dL — ABNORMAL HIGH (ref 70–99)
Glucose-Capillary: 183 mg/dL — ABNORMAL HIGH (ref 70–99)

## 2024-03-14 LAB — URINE CULTURE: Culture: NO GROWTH

## 2024-03-14 MED ORDER — DILTIAZEM HCL ER COATED BEADS 120 MG PO CP24
120.0000 mg | ORAL_CAPSULE | Freq: Every day | ORAL | Status: DC
  Administered 2024-03-14 – 2024-03-23 (×10): 120 mg via ORAL
  Filled 2024-03-14 (×10): qty 1

## 2024-03-14 MED ORDER — ASPIRIN 81 MG PO TBEC
81.0000 mg | DELAYED_RELEASE_TABLET | Freq: Every day | ORAL | Status: DC
  Administered 2024-03-14 – 2024-03-17 (×4): 81 mg via ORAL
  Filled 2024-03-14 (×4): qty 1

## 2024-03-14 NOTE — Progress Notes (Signed)
 Progress Note  Patient Name: Alfred Carney Date of Encounter: 03/14/2024  Primary Cardiologist: New - consult by Darron, MD   Subjective   Chronic dyspnea is unchanged. No chest pain, palpitations, dizziness, presyncope, or syncope. Burden of brief paroxysms of atrial tachycardia into the 120s to 140s bpm is improving, lasting a few seconds in duration. Documented UOP 140 mL for the past 24 hours, net - 3.8 L for the admission. Weight 127.7 to 131.2 kg over the last 24 hours. Renal function continues to improve.    Inpatient Medications    Scheduled Meds:  allopurinol   200 mg Oral Daily   cholecalciferol   1,000 Units Oral Daily   diltiazem  30 mg Oral Q6H   enoxaparin  (LOVENOX ) injection  70 mg Subcutaneous Q24H   feeding supplement  237 mL Oral BID BM   insulin  aspart  0-15 Units Subcutaneous TID WC   insulin  aspart  0-5 Units Subcutaneous QHS   irbesartan   300 mg Oral Daily   lidocaine   1 patch Transdermal Q24H   pravastatin   40 mg Oral q1800   tamsulosin   0.4 mg Oral QHS   Continuous Infusions:   PRN Meds: acetaminophen  **OR** acetaminophen , albuterol , calcium  carbonate **AND** simethicone , metoprolol  tartrate, nitroGLYCERIN , ondansetron  **OR** ondansetron  (ZOFRAN ) IV, traMADol   Vital Signs    Vitals:   03/14/24 0100 03/14/24 0500 03/14/24 0519 03/14/24 0731  BP: (!) 150/69  (!) 125/42 123/66  Pulse: 86  87 90  Resp: 16  16 18   Temp: 98.5 F (36.9 C)  98.9 F (37.2 C) 98.3 F (36.8 C)  TempSrc: Oral  Oral   SpO2: 95%  94% 100%  Weight:  131.2 kg    Height:        Intake/Output Summary (Last 24 hours) at 03/14/2024 0830 Last data filed at 03/13/2024 2200 Gross per 24 hour  Intake 960 ml  Output 1100 ml  Net -140 ml   Filed Weights   03/12/24 1706 03/13/24 0500 03/14/24 0500  Weight: 127.7 kg 127.7 kg 131.2 kg    Telemetry    SR with brief episodes of atrial tachycardia into the 120s to 140s bpm, lasting a few seconds in duration with an overall  improved burden - Personally Reviewed  ECG    No new tracings - Personally Reviewed  Physical Exam   GEN: No acute distress.   Neck: JVD difficult to assess secondary to body habitus. Cardiac: Distant, RRR, no murmurs, rubs, or gallops.  Respiratory: Diminished breath sounds bilaterally.  GI: Soft, nontender, non-distended.   MS: Moderate bilateral lower extremity edema with chronic stasis dermatitis; No deformity. Neuro:  Alert and oriented x 3; Nonfocal.  Psych: Normal affect.  Labs    Chemistry Recent Labs  Lab 03/11/24 0818 03/12/24 0536 03/13/24 0836  NA 138 140 138  K 3.1* 3.6 3.5  CL 94* 96* 94*  CO2 30 32 33*  GLUCOSE 121* 165* 168*  BUN 19 23 23   CREATININE 1.43* 1.33* 1.28*  CALCIUM  8.9 8.4* 8.4*  PROT 7.2  --   --   ALBUMIN  2.8*  --   --   AST 37  --   --   ALT 30  --   --   ALKPHOS 89  --   --   BILITOT 1.1  --   --   GFRNONAA 51* 56* 59*  ANIONGAP 14 12 11      Hematology Recent Labs  Lab 03/11/24 0818 03/12/24 0536  WBC 18.7* 16.4*  RBC  4.73 4.42  HGB 13.6 12.8*  HCT 42.9 39.9  MCV 90.7 90.3  MCH 28.8 29.0  MCHC 31.7 32.1  RDW 14.5 14.6  PLT 357 356    Cardiac EnzymesNo results for input(s): TROPONINI in the last 168 hours. No results for input(s): TROPIPOC in the last 168 hours.   BNP Recent Labs  Lab 03/11/24 0818  BNP 116.1*     DDimer No results for input(s): DDIMER in the last 168 hours.   Radiology    DG Chest 1 View Result Date: 03/11/2024 IMPRESSION: Emphysema.  No acute cardiopulmonary abnormality. Electronically Signed   By: Rogelia Myers M.D.   On: 03/11/2024 08:37    Cardiac Studies   2D echo 03/12/2024: 1. Left ventricular ejection fraction, by estimation, is 55 to 60%. The  left ventricle has normal function. Left ventricular endocardial border  not optimally defined to evaluate regional wall motion. Left ventricular  diastolic parameters are consistent  with Grade I diastolic dysfunction (impaired  relaxation).   2. Right ventricular systolic function is normal. The right ventricular  size is mildly enlarged. Tricuspid regurgitation signal is inadequate for  assessing PA pressure.   3. The mitral valve was not well visualized. No evidence of mitral valve  regurgitation. No evidence of mitral stenosis.   4. The aortic valve was not well visualized. Aortic valve regurgitation  is not visualized. No aortic stenosis is present.   5. Challenging image quality.  __________  2D echo 06/10/2020: 1. Left ventricular ejection fraction, by estimation, is 60 to 65%. The  left ventricle has normal function. The left ventricle has no regional  wall motion abnormalities. Left ventricular diastolic parameters are  consistent with Grade I diastolic  dysfunction (impaired relaxation).   2. Right ventricular systolic function is normal. The right ventricular  size is normal.   3. Left atrial size was moderately dilated.   4. The mitral valve is normal in structure. Trivial mitral valve  regurgitation. No evidence of mitral stenosis.   5. The aortic valve is normal in structure. Aortic valve regurgitation is  not visualized. No aortic stenosis is present.   6. The inferior vena cava is normal in size with greater than 50%  respiratory variability, suggesting right atrial pressure of 3 mmHg.  __________   2D echo 10/27/2018: 1. The left ventricle has normal systolic function of 60-65%. The cavity  size was normal. Left ventricular diastolic parameters were normal.   2. The right ventricle has normal systolic function. The cavity was  normal. There is no increase in right ventricular wall thickness. Right  ventricular systolic pressure could not be assessed.   3. The mitral valve is normal in structure.   4. The tricuspid valve is normal in structure.  Patient Profile     73 y.o. 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 Tobie.   Assessment & Plan    1. Atrial tachycardia: - He is having brief paroxysms of narrow complex tachycardia consistent with atrial tachycardia on telemetry with regular RR intervals - No evidence of A-fib on EKG or telemetry.  EKG computer printout is A-fib, though interpretation of this shows sinus tachycardia with PACs - Likely in the setting of untreated sleep  apnea - Burden of brief paroxysms of atrial tachycardia lasting a few seconds is improving  - Consolidate short-acting diltiazem to Cardizem CD 120 mg daily - Continue Toprol -XL 25 mg - Eliquis  stopped on 6/27 given no evidence of Afib - Echo without significant structural abnormality  - TSH normal - Potassium improved, magnesium at goal - No indication for antiarrhythmic therapy at this time   2.  Acute on chronic diastolic CHF: - Likely exacerbated by morbid obesity, untreated sleep apnea, and OHS - Volume status is difficult to assess on physical exam due to body habitus - Defer addition of SGLT2 inhibitor given morbid obesity with concern for off target effect - If renal function continues to improve, consider addition of MRA   3.  CAD involving the native coronary arteries without angina: - Never with symptoms of chest pain - High-sensitivity troponin negative x 2 - Await echo - No indication for heparin  drip or plans for inpatient ischemic evaluation at this time - Start aspirin  81 mg daily - LDL 67 in 11/2023, previously on rosuvastatin  when he was last seen in our office in 2015 - Now on pravastatin  40 mg   4.  CKD stage II: - Improving following diuresis   5.  Morbid obesity with untreated OSA and likely OHS: - Did not tolerate CPAP, will need sleep medicine follow up - Weight loss is  recommended - Would benefit from healthy weight and wellness center and bariatric evaluation in the outpatient setting - High risk for arrhythmia with body habitus and untreated sleep apnea      For questions or updates, please contact [facility] Please consult [email or web address] for contact info under Cardiology/STEMI.    Signed, Bernardino Bring, PA-C [facility] Pager: [phone] 03/14/2024, 8:30 AM

## 2024-03-14 NOTE — NC FL2 (Signed)
 [place]  MEDICAID FL2 LEVEL OF CARE FORM     IDENTIFICATION  Patient Name: Alfred Carney Birthdate: 23-Feb-1951 Sex: male Admission Date (Current Location): 03/11/2024  [facility] and [place] Number:  Chiropodist and Address:  [facility], [address], [place], [place] [address]      Provider Number: [id number]  Attending Physician Name and Address:  Tobie Calix, MD  Relative Name and Phone Number:  Wife(  [phone])    Current Level of Care: SNF Recommended Level of Care: Skilled Nursing Facility Prior Approval Number:    Date Approved/Denied:   PASRR Number:    Discharge Plan: SNF    Current Diagnoses: Patient Active Problem List   Diagnosis Date Noted   Weakness 03/14/2024   Paroxysmal SVT (supraventricular tachycardia) (HCC) 03/13/2024   New onset of congestive heart failure (HCC) 03/11/2024   Hypokalemia 03/11/2024   CKD stage 3a, GFR 45-59 ml/min (HCC) 03/11/2024   New onset atrial fibrillation (HCC) 03/11/2024   Polyarthralgia 05/20/2023   Pedal edema 05/20/2023   History of gout 05/20/2023   BPH with obstruction/lower urinary tract symptoms 10/10/2022   Class 3 severe obesity due to excess calories with serious comorbidity and body mass index (BMI) of 45.0 to 49.9 in adult 10/10/2022   Psoriasis 10/10/2022   Degenerative arthritis of hip 05/08/2022   Hernia, hiatal 05/08/2022   Hypertension associated with diabetes (HCC) 05/08/2022   Hyperlipidemia associated with type 2 diabetes mellitus (HCC) 05/08/2022   Venous stasis dermatitis of both lower extremities 01/30/2021   Chronic diastolic heart failure (HCC) 06/22/2020   Posterior reversible encephalopathy syndrome 06/11/2020   Leukocytosis 10/21/2019   Chronic respiratory failure with hypoxia and hypercapnia (HCC) 02/05/2019   Solitary pulmonary nodule on lung CT 01/29/2018   Bilateral hearing loss 03/12/2017   Angina pectoris (HCC) 03/12/2017   Vitamin D  deficiency  08/01/2014   CAD in native artery 09/25/2011   PAD (peripheral artery disease) (HCC) 09/25/2011   Hyperlipidemia 09/25/2011   ABDOMINAL AORTIC ANEURYSM, HX OF 06/30/2007   COPD GOLD II 06/30/2007    Orientation RESPIRATION BLADDER Height & Weight     Self, Time, Situation, Place  O2 (Continous) Incontinent Weight: 131.2 kg Height:  5' 9.5 (176.5 cm)  BEHAVIORAL SYMPTOMS/MOOD NEUROLOGICAL BOWEL NUTRITION STATUS      Continent Diet (heart healthy)  AMBULATORY STATUS COMMUNICATION OF NEEDS Skin   Extensive Assist Verbally Normal                       Personal Care Assistance Level of Assistance  Bathing, Dressing Bathing Assistance: Limited assistance   Dressing Assistance: Limited assistance     Functional Limitations Info             SPECIAL CARE FACTORS FREQUENCY  PT (By licensed PT), OT (By licensed OT)     PT Frequency: 5x a wk OT Frequency: 5x a wk            Contractures      Additional Factors Info  Code Status Code Status Info: Full             Current Medications (03/14/2024):  This is the current hospital active medication list Current Facility-Administered Medications  Medication Dose Route Frequency Provider Last Rate Last Admin   acetaminophen  (TYLENOL ) tablet 1,000 mg  1,000 mg Oral Q6H PRN Jesus America, NP   1,000 mg at 03/14/24 0855   Or   acetaminophen  (TYLENOL ) suppository 650 mg  650  mg Rectal Q6H PRN Jesus America, NP       albuterol  (PROVENTIL ) (2.5 MG/3ML) 0.083% nebulizer solution 3 mL  3 mL Inhalation Q4H PRN Cox, Amy N, DO       allopurinol  (ZYLOPRIM ) tablet 200 mg  200 mg Oral Daily Cox, Amy N, DO   200 mg at 03/14/24 0855   aspirin  EC tablet 81 mg  81 mg Oral Daily Abigail Motto M, PA-C   81 mg at 03/14/24 9145   calcium  carbonate (TUMS - dosed in mg elemental calcium ) chewable tablet 200 mg of elemental calcium   1 tablet Oral Daily PRN Hallaji, Sheema M, RPH       And   simethicone  (MYLICON) chewable tablet 80 mg  80  mg Oral PRN Hallaji, Sheema M, RPH       cholecalciferol  (VITAMIN D3) 25 MCG (1000 UNIT) tablet 1,000 Units  1,000 Units Oral Daily Cox, Amy N, DO   1,000 Units at 03/14/24 0854   diltiazem (CARDIZEM CD) 24 hr capsule 120 mg  120 mg Oral Daily Abigail Motto M, PA-C   120 mg at 03/14/24 1019   enoxaparin  (LOVENOX ) injection 70 mg  70 mg Subcutaneous Q24H Abigail Motto M, PA-C   70 mg at 03/13/24 2243   feeding supplement (ENSURE PLUS HIGH PROTEIN) liquid 237 mL  237 mL Oral BID BM Patel, Sona, MD   237 mL at 03/14/24 1543   insulin  aspart (novoLOG ) injection 0-15 Units  0-15 Units Subcutaneous TID WC Cox, Amy N, DO   3 Units at 03/14/24 1242   insulin  aspart (novoLOG ) injection 0-5 Units  0-5 Units Subcutaneous QHS Cox, Amy N, DO   2 Units at 03/13/24 2245   irbesartan  (AVAPRO ) tablet 300 mg  300 mg Oral Daily Cox, Amy N, DO   300 mg at 03/14/24 0856   lidocaine  (LIDODERM ) 5 % 1 patch  1 patch Transdermal Q24H Cox, Amy N, DO   1 patch at 03/14/24 9141   metoprolol  tartrate (LOPRESSOR ) injection 5 mg  5 mg Intravenous Q4H PRN Cox, Amy N, DO   5 mg at 03/11/24 2316   nitroGLYCERIN  (NITROSTAT ) SL tablet 0.4 mg  0.4 mg Sublingual Q5 Min x 3 PRN Cox, Amy N, DO       ondansetron  (ZOFRAN ) tablet 4 mg  4 mg Oral Q6H PRN Cox, Amy N, DO       Or   ondansetron  (ZOFRAN ) injection 4 mg  4 mg Intravenous Q6H PRN Cox, Amy N, DO       pravastatin  (PRAVACHOL ) tablet 40 mg  40 mg Oral q1800 Cox, Amy N, DO   40 mg at 03/13/24 1734   tamsulosin  (FLOMAX ) capsule 0.4 mg  0.4 mg Oral QHS Cox, Amy N, DO   0.4 mg at 03/13/24 2244   traMADol (ULTRAM) tablet 50 mg  50 mg Oral Q6H PRN Patel, Sona, MD   50 mg at 03/14/24 1125     Discharge Medications: Please see discharge summary for a list of discharge medications.  Relevant Imaging Results:  Relevant Lab Results:   Additional Information SS# [id number]  Marinda Cooks, RN

## 2024-03-14 NOTE — Progress Notes (Signed)
 [facility]  - [place] [facility]   PATIENT NAME: Alfred Carney    MR#:  [id number]  DATE OF BIRTH:  11/30/1950  SUBJECTIVE:  met with patient's daughter at bedside patient a very poor historian.  Heartrate much improved.  Patient somewhat irritable due to generalized aches and pains. Work with PT however took a lot of effort to do so.  VITALS:  Blood pressure 102/86, pulse (!) 101, temperature 98.2 F (36.8 C), resp. rate 18, height 5' 9.5 (1.765 m), weight 131.2 kg, SpO2 96%.  PHYSICAL EXAMINATION:   GENERAL:  73 y.o.-year-old patient with no acute distress. Morbidly obese LUNGS: distant breath sounds bilaterally, no wheezing CARDIOVASCULAR: S1, S2 normal. No murmur, tachycardia ABDOMEN: Soft, nontender Abdominal obesity EXTREMITIES: chronic edema b/l.    NEUROLOGIC: nonfocal  patient is alert and awake.   LABORATORY PANEL:  CBC Recent Labs  Lab 03/12/24 0536  WBC 16.4*  HGB 12.8*  HCT 39.9  PLT 356    Chemistries  Recent Labs  Lab 03/11/24 0818 03/12/24 0536 03/13/24 0836  NA 138   < > 138  K 3.1*   < > 3.5  CL 94*   < > 94*  CO2 30   < > 33*  GLUCOSE 121*   < > 168*  BUN 19   < > 23  CREATININE 1.43*   < > 1.28*  CALCIUM  8.9   < > 8.4*  MG 2.1  --   --   AST 37  --   --   ALT 30  --   --   ALKPHOS 89  --   --   BILITOT 1.1  --   --    < > = values in this interval not displayed.   Cardiac Enzymes No results for input(s): TROPONINI in the last 168 hours. RADIOLOGY:  ECHOCARDIOGRAM COMPLETE Result Date: 03/12/2024    ECHOCARDIOGRAM REPORT   Patient Name:   Alfred Carney Date of Exam: 03/12/2024 Medical Rec #:  [id number]    Height:       69.0 in Accession #:    [id number]   Weight:       322.0 lb Date of Birth:  [birth year]     BSA:          2.529 m Patient Age:    73 years     BP:           102/85 mmHg Patient Gender: M            HR:           93 bpm. Exam Location:  [facility] Procedure: 2D Echo, Cardiac Doppler, Color Doppler and  Intracardiac            Opacification Agent (Both Spectral and Color Flow Doppler were            utilized during procedure). Indications:     Dyspnea R06.00                  Atrial Fibrillation I48.91  History:         Patient has prior history of Echocardiogram examinations, most                  recent 06/10/2020. Signs/Symptoms:Shortness of Breath.  Sonographer:     Ashley McNeely-Sloane Referring Phys:  [id number] AMY N COX Diagnosing Phys: Deatrice Cage MD IMPRESSIONS  1. Left ventricular ejection fraction, by estimation, is 55 to 60%. The left ventricle has normal  function. Left ventricular endocardial border not optimally defined to evaluate regional wall motion. Left ventricular diastolic parameters are consistent with Grade I diastolic dysfunction (impaired relaxation).  2. Right ventricular systolic function is normal. The right ventricular size is mildly enlarged. Tricuspid regurgitation signal is inadequate for assessing PA pressure.  3. The mitral valve was not well visualized. No evidence of mitral valve regurgitation. No evidence of mitral stenosis.  4. The aortic valve was not well visualized. Aortic valve regurgitation is not visualized. No aortic stenosis is present.  5. Challenging image quality. FINDINGS  Left Ventricle: Left ventricular ejection fraction, by estimation, is 55 to 60%. The left ventricle has normal function. Left ventricular endocardial border not optimally defined to evaluate regional wall motion. Definity  contrast agent was given IV to delineate the left ventricular endocardial borders. The left ventricular internal cavity size was normal in size. There is no left ventricular hypertrophy. Left ventricular diastolic parameters are consistent with Grade I diastolic dysfunction (impaired relaxation). Right Ventricle: The right ventricular size is mildly enlarged. No increase in right ventricular wall thickness. Right ventricular systolic function is normal. Tricuspid regurgitation  signal is inadequate for assessing PA pressure. Left Atrium: Left atrial size was normal in size. Right Atrium: Right atrial size was normal in size. Pericardium: There is no evidence of pericardial effusion. Mitral Valve: The mitral valve was not well visualized. No evidence of mitral valve regurgitation. No evidence of mitral valve stenosis. MV peak gradient, 4.8 mmHg. The mean mitral valve gradient is 2.0 mmHg. Tricuspid Valve: The tricuspid valve is not well visualized. Tricuspid valve regurgitation is not demonstrated. No evidence of tricuspid stenosis. Aortic Valve: The aortic valve was not well visualized. Aortic valve regurgitation is not visualized. No aortic stenosis is present. Aortic valve mean gradient measures 3.0 mmHg. Aortic valve peak gradient measures 5.7 mmHg. Aortic valve area, by VTI measures 2.25 cm. Pulmonic Valve: The pulmonic valve was not well visualized. Pulmonic valve regurgitation is not visualized. No evidence of pulmonic stenosis. Aorta: The aortic root is normal in size and structure. Venous: The inferior vena cava was not well visualized. IAS/Shunts: No atrial level shunt detected by color flow Doppler.  LEFT VENTRICLE PLAX 2D LVOT diam:     1.90 cm     Diastology LV SV:         45          LV e' medial:    4.03 cm/s LV SV Index:   18          LV E/e' medial:  14.0 LVOT Area:     2.84 cm    LV e' lateral:   8.38 cm/s                            LV E/e' lateral: 6.8  LV Volumes (MOD) LV vol d, MOD A2C: 55.7 ml LV vol d, MOD A4C: 51.6 ml LV vol s, MOD A2C: 35.7 ml LV vol s, MOD A4C: 28.5 ml LV SV MOD A2C:     20.0 ml LV SV MOD A4C:     51.6 ml LV SV MOD BP:      19.6 ml RIGHT VENTRICLE RV Basal diam:  4.50 cm RV S prime:     12.90 cm/s TAPSE (M-mode): 1.6 cm LEFT ATRIUM           Index       RIGHT ATRIUM  Index LA Vol (A4C): 22.8 ml 9.01 ml/m  RA Area:     15.30 cm                                   RA Volume:   39.80 ml  15.73 ml/m  AORTIC VALVE AV Area (Vmax):    2.25 cm  AV Area (Vmean):   2.17 cm AV Area (VTI):     2.25 cm AV Vmax:           119.00 cm/s AV Vmean:          80.100 cm/s AV VTI:            0.199 m AV Peak Grad:      5.7 mmHg AV Mean Grad:      3.0 mmHg LVOT Vmax:         94.40 cm/s LVOT Vmean:        61.200 cm/s LVOT VTI:          0.158 m LVOT/AV VTI ratio: 0.79  AORTA Ao Root diam: 3.50 cm MITRAL VALVE MV Area (PHT): 3.31 cm     SHUNTS MV Area VTI:   2.01 cm     Systemic VTI:  0.16 m MV Peak grad:  4.8 mmHg     Systemic Diam: 1.90 cm MV Mean grad:  2.0 mmHg MV Vmax:       1.09 m/s MV Vmean:      72.2 cm/s MV Decel Time: 229 msec MV E velocity: 56.60 cm/s MV A velocity: 101.00 cm/s MV E/A ratio:  0.56 Deatrice Cage MD Electronically signed by Deatrice Cage MD Signature Date/Time: 03/12/2024/3:36:07 PM    Final     Assessment and Plan Alfred Carney is a 73 year old male with history of non-insulin -dependent diabetes mellitus, PAD, hyperlipidemia, hypertension, history of gout, BPH, COPD on chronic 2 L nasal cannula at baseline, history of heart failure preserved ejection fraction, who presents emergency department for chief concerns of weakness for 10 days.   Paroxysmal atrial tachycardia acute diastolic congestive heart failure -- initial EKG showed atrial fibrillation however rhythm strip evaluated by cardiology shows PACs with sinus tachycardia -- will continue Cardizem 30 mg Q8 and PRN IV metoprolol -- change to Cardizem CD -- echo EF55-60%     -- [facility] cardiology input noted. Appears secondary to untreated sleep apnea/obesity hypoventilation syndrome -- IV Lasix -- with good urine output-- Lasix  discontinued.  Leukocytosis possible UTI -- patient not able to give any symptoms. However has abnormal UA and with weakness will treat for three doses of Rocephin . -- Urine culture negative  Chronic respiratory failure with chronic emphysema and untreated sleep apnea -- patient wears 2 L chronically oxygen  -- PRN nebulizer -- recommend reach out to  pulmonary and see if different mask or nasal tubing can be used for CPAP.  Generalized weakness deconditioning/recent fall --  PT/OT--rehab. Pt very deconditioned  Type II diabetes with hyperglycemia -- will resume glipizide  -- continue sliding scale  Hyperlipidemia -- statins  Bilateral ankle -- patient tells me he has history of gout. Already on allopurinol  -- will give PRN tramadol --  uric acid 5.3  Procedures: Family communication : family in the ER Consults : [facility] cardiology CODE STATUS: full DVT Prophylaxis : Lovenox  Level of care: Telemetry Cardiac Status is: Inpatient Remains inpatient appropriate because: CHF, tachycardia    TOTAL TIME TAKING CARE OF THIS PATIENT: 35 minutes.  >  50% time spent on counselling and coordination of care  Note: This dictation was prepared with Dragon dictation along with smaller phrase technology. Any transcriptional errors that result from this process are unintentional.  Leita Blanch M.D    [facility]   CC: Primary care physician; Corwin Antu, FNP

## 2024-03-14 NOTE — Evaluation (Signed)
 Physical Therapy Evaluation Patient Details Name: Alfred Carney MRN: [id number] DOB: [birth year] Today's Date: 03/14/2024  History of Present Illness  73 year old male with history of non-insulin -dependent diabetes mellitus, PAD, hyperlipidemia, hypertension, history of gout, BPH, COPD on chronic 2 L nasal cannula at baseline, history of heart failure preserved ejection fraction, who presents emergency department for chief concerns of weakness for 10 days.  Clinical Impression  Pt is a pleasant 73 year old male who was admitted for CHF. Pt performs bed mobility with min a, requiring multiple verbal and tactile cues for initiation of movement and use of hand rails. STS transfer attempt with use of RW unsuccessful as pt unable to self propel up despite raised height of bed or cueing. Use of stedy was required to assist with STS, mod a x2 initially for safety, increased time needed to complete transition.  Ambulation not appropriate this date. Pt educated throughout session on importance of general mobility and LE strengthening exercises to improve strength. Pt demonstrates deficits with strength, balance, and activity tolerance that are impacting his general QoL. Pt would benefit from skilled PT interventions to meet therapy goals. PT to follow acutely as appropriate.          If plan is discharge home, recommend the following: A lot of help with walking and/or transfers;A lot of help with bathing/dressing/bathroom;Assistance with cooking/housework;Assist for transportation;Help with stairs or ramp for entrance   Can travel by private vehicle   No    Equipment Recommendations None recommended by PT  Recommendations for Other Services       Functional Status Assessment Patient has had a recent decline in their functional status and demonstrates the ability to make significant improvements in function in a reasonable and predictable amount of time.     Precautions / Restrictions  Precautions Precautions: Fall Recall of Precautions/Restrictions: Intact Restrictions Weight Bearing Restrictions Per Provider Order: No      Mobility  Bed Mobility Overal bed mobility: Needs Assistance Bed Mobility: Supine to Sit     Supine to sit: Min assist     General bed mobility comments: min a with increased time to complete movement, verbal/tactile cueing on hand rail use    Transfers Overall transfer level: Needs assistance Equipment used: Rolling walker (2 wheels) Transfers: Sit to/from Stand Sit to Stand: Via lift equipment, +2 safety/equipment, Mod assist, From elevated surface           General transfer comment: pt unable to stand from EOB with RW with x2 attempts, with use of stedy able to stand with mod a x2, transfered to recliner, no LOB experienced Transfer via Lift Equipment: Stedy  Ambulation/Gait               General Gait Details: not appropriate this date  Stairs            Wheelchair Mobility     Tilt Bed    Modified Rankin (Stroke Patients Only)       Balance Overall balance assessment: Needs assistance Sitting-balance support: Feet supported, Bilateral upper extremity supported Sitting balance-Leahy Scale: Fair Sitting balance - Comments: sitting EOB   Standing balance support: Bilateral upper extremity supported, During functional activity Standing balance-Leahy Scale: Fair Standing balance comment: with stedy, holding on to grab bar                             Pertinent Vitals/Pain Pain Assessment Pain Assessment: Faces Faces Pain Scale: Hurts little  more Pain Location: B ankles Pain Descriptors / Indicators: Aching, Discomfort Pain Intervention(s): Monitored during session, Repositioned    Home Living Family/patient expects to be discharged to:: Private residence Living Arrangements: Spouse/significant other Available Help at Discharge: Family;Available PRN/intermittently Type of Home:  House Home Access: Stairs to enter Entrance Stairs-Rails: Left;Right;Can reach both Entrance Stairs-Number of Steps: 3   Home Layout: One level Home Equipment: Agricultural consultant (2 wheels);Rollator (4 wheels);Cane - single point;Shower seat;Grab bars - tub/shower Additional Comments: uses RW at baseline, short ambulation distances, mainly stays at home    Prior Function Prior Level of Function : Independent/Modified Independent             Mobility Comments: mod I at baseline ADLs Comments: Pt reports sleeping in recliner chair and only ambulating short distances to bathroom with RW for toileting and shower. Wife performs all IADLs.     Extremity/Trunk Assessment   Upper Extremity Assessment Upper Extremity Assessment: Defer to OT evaluation    Lower Extremity Assessment Lower Extremity Assessment: Generalized weakness    Cervical / Trunk Assessment Cervical / Trunk Assessment: Kyphotic  Communication   Communication Communication: Impaired Factors Affecting Communication: Hearing impaired    Cognition Arousal: Alert Behavior During Therapy: WFL for tasks assessed/performed   PT - Cognitive impairments: No apparent impairments                         Following commands: Intact       Cueing Cueing Techniques: Verbal cues, Gestural cues, Tactile cues     General Comments      Exercises General Exercises - Lower Extremity Ankle Circles/Pumps: AROM, Both, 10 reps, Seated Long Arc Quad: AROM, Both, 10 reps, Seated   Assessment/Plan    PT Assessment Patient needs continued PT services  PT Problem List Decreased strength;Decreased activity tolerance;Decreased range of motion;Decreased balance;Decreased mobility;Decreased coordination;Decreased knowledge of use of DME;Decreased safety awareness       PT Treatment Interventions Gait training;DME instruction;Stair training;Functional mobility training;Therapeutic activities;Therapeutic exercise;Balance  training;Patient/family education    PT Goals (Current goals can be found in the Care Plan section)  Acute Rehab PT Goals Patient Stated Goal: to decrease pain and feel better PT Goal Formulation: With patient Time For Goal Achievement: 03/28/24 Potential to Achieve Goals: Good    Frequency Min 2X/week     Co-evaluation               AM-PAC PT 6 Clicks Mobility  Outcome Measure Help needed turning from your back to your side while in a flat bed without using bedrails?: A Little Help needed moving from lying on your back to sitting on the side of a flat bed without using bedrails?: A Little Help needed moving to and from a bed to a chair (including a wheelchair)?: A Lot Help needed standing up from a chair using your arms (e.g., wheelchair or bedside chair)?: A Lot Help needed to walk in hospital room?: A Lot Help needed climbing 3-5 steps with a railing? : A Lot 6 Click Score: 14    End of Session Equipment Utilized During Treatment: Oxygen  Activity Tolerance: Patient limited by pain Patient left: in chair;with call bell/phone within reach;with chair alarm set Nurse Communication: Mobility status;Need for lift equipment PT Visit Diagnosis: Unsteadiness on feet (R26.81);Other abnormalities of gait and mobility (R26.89);Muscle weakness (generalized) (M62.81)    Time: 9180-9149 PT Time Calculation (min) (ACUTE ONLY): 31 min   Charges:  Trayden Brandy Romero-Perozo, SPT  03/14/2024, 12:37 PM

## 2024-03-14 NOTE — TOC Progression Note (Signed)
 Transition of Care [facility]) - Progression Note    Patient Details  Name: Alfred Carney MRN: [id number] Date of Birth: 1951-04-01  [facility]) CM/SW Contact  Marinda Cooks, RN Phone Number: 03/14/2024, 5:05 PM  Clinical Narrative:    This CM introduced role and completed Initial assessement with wife due to Pt being A&O x 2 with some confusion per chart review.  Pt lives in a  [place] style home with 3  steps to enter. Pt has family support provided by wife, children, & nieces . Pt uses  Psychologist, forensic on [employer] in [place]. PCP is Ginger EMERSON Patrick, FNP-C [facility] [address] [place], [place] [address].Pt uses DME RW , Rollator , & Oxygen  at home continuous . Mrs. Ferrentino informed Oxygen  DME company used is [employer] reports not  being in SNF prior to  this admission or having HH in place . TOC will cont to follow and update as applicable.   Expected Discharge Plan: Skilled Nursing Facility    Expected Discharge Plan and Services                           DME Agency: [employer]                   Social Determinants of Health (SDOH) Interventions SDOH Screenings   Food Insecurity: No Food Insecurity (03/12/2024)  Housing: Low Risk  (03/12/2024)  Transportation Needs: No Transportation Needs (03/12/2024)  Utilities: Not At Risk (03/12/2024)  Alcohol Screen: Low Risk  (05/09/2023)  Depression (PHQ2-9): Medium Risk (11/18/2023)  Financial Resource Strain: Low Risk  (05/27/2023)  Physical Activity: Insufficiently Active (05/27/2023)  Social Connections: Moderately Integrated (03/12/2024)  Stress: No Stress Concern Present (05/27/2023)  Tobacco Use: Medium Risk (03/11/2024)  Health Literacy: Adequate Health Literacy (05/12/2023)    Readmission Risk Interventions     No data to display

## 2024-03-15 ENCOUNTER — Other Ambulatory Visit (HOSPITAL_COMMUNITY): Payer: Self-pay

## 2024-03-15 ENCOUNTER — Telehealth (HOSPITAL_COMMUNITY): Payer: Self-pay | Admitting: Pharmacy Technician

## 2024-03-15 ENCOUNTER — Inpatient Hospital Stay

## 2024-03-15 DIAGNOSIS — I509 Heart failure, unspecified: Secondary | ICD-10-CM | POA: Diagnosis not present

## 2024-03-15 DIAGNOSIS — I471 Supraventricular tachycardia, unspecified: Secondary | ICD-10-CM | POA: Diagnosis not present

## 2024-03-15 DIAGNOSIS — G4733 Obstructive sleep apnea (adult) (pediatric): Secondary | ICD-10-CM | POA: Diagnosis not present

## 2024-03-15 DIAGNOSIS — I4719 Other supraventricular tachycardia: Secondary | ICD-10-CM | POA: Diagnosis not present

## 2024-03-15 LAB — GLUCOSE, CAPILLARY
Glucose-Capillary: 171 mg/dL — ABNORMAL HIGH (ref 70–99)
Glucose-Capillary: 158 mg/dL — ABNORMAL HIGH (ref 70–99)
Glucose-Capillary: 153 mg/dL — ABNORMAL HIGH (ref 70–99)
Glucose-Capillary: 170 mg/dL — ABNORMAL HIGH (ref 70–99)

## 2024-03-15 MED ORDER — PREGABALIN 50 MG PO CAPS
50.0000 mg | ORAL_CAPSULE | Freq: Every day | ORAL | Status: DC
  Administered 2024-03-15 – 2024-03-17 (×3): 50 mg via ORAL
  Filled 2024-03-15 (×3): qty 1

## 2024-03-15 MED ORDER — TRAMADOL HCL 50 MG PO TABS
50.0000 mg | ORAL_TABLET | Freq: Four times a day (QID) | ORAL | Status: DC | PRN
  Administered 2024-03-15 – 2024-03-22 (×8): 50 mg via ORAL
  Filled 2024-03-15 (×8): qty 1

## 2024-03-15 MED ORDER — OXYCODONE HCL 5 MG PO TABS
5.0000 mg | ORAL_TABLET | Freq: Four times a day (QID) | ORAL | Status: DC | PRN
  Administered 2024-03-15 – 2024-03-22 (×10): 5 mg via ORAL
  Filled 2024-03-15 (×10): qty 1

## 2024-03-15 MED ORDER — ENOXAPARIN SODIUM 80 MG/0.8ML IJ SOSY
65.0000 mg | PREFILLED_SYRINGE | INTRAMUSCULAR | Status: DC
  Administered 2024-03-15 – 2024-03-16 (×2): 65 mg via SUBCUTANEOUS
  Filled 2024-03-15 (×2): qty 0.8

## 2024-03-15 NOTE — Telephone Encounter (Signed)
 Patient Product/process development scientist completed.    The patient is insured through [facility]. Patient has Medicare and is not eligible for a copay card, but may be able to apply for patient assistance or Medicare RX Payment Plan (Patient Must reach out to their plan, if eligible for payment plan), if available.    Ran test claim for Entresto 24-26 mg and the current 30 day co-pay is $407.84 due to a deductible.  Ran test claim for Farxiga 10 mg and the current 30 day co-pay is $382.21 due to a deductible.  Ran test claim for Jardiance 10 mg and the current 30 day co-pay is $389.43 due to a deductible.  Ran test claim for Ozempic 2 mg/3 ml and the current 30 day co-pay is $478.89 due to a deductible.  Ran test claim for Mounjaro 2.5 mg/0.5ml and the current 30 day co-pay is $478.89 due to a deductible.  This test claim was processed through [facility]- copay amounts may vary at other pharmacies due to pharmacy/plan contracts, or as the patient moves through the different stages of their insurance plan.     Reyes Sharps, CPHT Pharmacy Technician III Certified Patient Advocate [facility] Patient Advocate Team Direct Number: [phone]  Fax: [phone]

## 2024-03-15 NOTE — TOC Progression Note (Signed)
 Transition of Care [facility]) - Progression Note    Patient Details  Name: Jaquarious Grey MRN: [id number] Date of Birth: 10/26/1950  Transition of Care [facility]) CM/SW Contact  Tomasa JAYSON Childes, RN Phone Number: 03/15/2024, 3:01 PM  Clinical Narrative:    Spoke with patient and his wife at bedside to give bed offers for [facility], [facility] and    Expected Discharge Plan: Skilled Nursing Facility    Expected Discharge Plan and Services                           DME Agency: [employer]                   Social Determinants of Health (SDOH) Interventions SDOH Screenings   Food Insecurity: No Food Insecurity (03/12/2024)  Housing: Low Risk  (03/12/2024)  Transportation Needs: No Transportation Needs (03/12/2024)  Utilities: Not At Risk (03/12/2024)  Alcohol Screen: Low Risk  (05/09/2023)  Depression (PHQ2-9): Medium Risk (11/18/2023)  Financial Resource Strain: Low Risk  (05/27/2023)  Physical Activity: Insufficiently Active (05/27/2023)  Social Connections: Moderately Integrated (03/12/2024)  Stress: No Stress Concern Present (05/27/2023)  Tobacco Use: Medium Risk (03/11/2024)  Health Literacy: Adequate Health Literacy (05/12/2023)    Readmission Risk Interventions     No data to display

## 2024-03-15 NOTE — Progress Notes (Addendum)
 Rounding Note   Patient Name: Alfred Carney Date of Encounter: 03/15/2024  [facility] Cardiologist: Peter Swaziland, MD   Subjective Patient reports chronic dyspnea is unchanged from baseline. He remains on 4 L supplemental O2. He used CPAP last night and tolerated it well. Paroxysms of atrial tachycardia overall improved.   Scheduled Meds:  allopurinol   200 mg Oral Daily   aspirin  EC  81 mg Oral Daily   cholecalciferol   1,000 Units Oral Daily   diltiazem  120 mg Oral Daily   enoxaparin  (LOVENOX ) injection  70 mg Subcutaneous Q24H   feeding supplement  237 mL Oral BID BM   insulin  aspart  0-15 Units Subcutaneous TID WC   insulin  aspart  0-5 Units Subcutaneous QHS   irbesartan   300 mg Oral Daily   lidocaine   1 patch Transdermal Q24H   pravastatin   40 mg Oral q1800   pregabalin  50 mg Oral Daily   tamsulosin   0.4 mg Oral QHS   Continuous Infusions:  PRN Meds: acetaminophen  **OR** acetaminophen , albuterol , calcium  carbonate **AND** simethicone , metoprolol  tartrate, nitroGLYCERIN , ondansetron  **OR** ondansetron  (ZOFRAN ) IV, oxyCODONE , traMADol   Vital Signs  Vitals:   03/15/24 0044 03/15/24 0500 03/15/24 0503 03/15/24 0802  BP: (!) 148/55  (!) 145/57 116/60  Pulse: 70  74 71  Resp: 19  20   Temp: 99.2 F (37.3 C)  98.2 F (36.8 C) 98.5 F (36.9 C)  TempSrc: Axillary  Axillary   SpO2: 94%  97% 96%  Weight:  129.8 kg    Height:        Intake/Output Summary (Last 24 hours) at 03/15/2024 1045 Last data filed at 03/15/2024 0600 Gross per 24 hour  Intake --  Output 700 ml  Net -700 ml      03/15/2024    5:00 AM 03/14/2024    5:00 AM 03/13/2024    5:00 AM  Last 3 Weights  Weight (lbs) 286 lb 2.5 oz 289 lb 3.9 oz 281 lb 8.4 oz  Weight (kg) 129.8 kg 131.2 kg 127.7 kg      Telemetry Sinus rhythm with frequent PACs, rate 65-105 bpm - Personally Reviewed  Physical Exam  GEN: No acute distress.   Neck: Unable to assess JVD due to body habitus Cardiac: RRR, no  murmurs, rubs, or gallops.  Respiratory: Diminished breath sounds bilaterally. GI: Soft, nontender, non-distended  MS: Trace LE edema; No deformity. Neuro:  Nonfocal  Psych: Normal affect   Labs High Sensitivity Troponin:   Recent Labs  Lab 03/11/24 0818 03/11/24 0952  TROPONINIHS 16 13     Chemistry Recent Labs  Lab 03/11/24 0818 03/12/24 0536 03/13/24 0836  NA 138 140 138  K 3.1* 3.6 3.5  CL 94* 96* 94*  CO2 30 32 33*  GLUCOSE 121* 165* 168*  BUN 19 23 23   CREATININE 1.43* 1.33* 1.28*  CALCIUM  8.9 8.4* 8.4*  MG 2.1  --   --   PROT 7.2  --   --   ALBUMIN  2.8*  --   --   AST 37  --   --   ALT 30  --   --   ALKPHOS 89  --   --   BILITOT 1.1  --   --   GFRNONAA 51* 56* 59*  ANIONGAP 14 12 11     Lipids No results for input(s): CHOL, TRIG, HDL, LABVLDL, LDLCALC, CHOLHDL in the last 168 hours.  Hematology Recent Labs  Lab 03/11/24 0818 03/12/24 0536  WBC 18.7*  16.4*  RBC 4.73 4.42  HGB 13.6 12.8*  HCT 42.9 39.9  MCV 90.7 90.3  MCH 28.8 29.0  MCHC 31.7 32.1  RDW 14.5 14.6  PLT 357 356   Thyroid   Recent Labs  Lab 03/11/24 0952  TSH 1.339    BNP Recent Labs  Lab 03/11/24 0818  BNP 116.1*    DDimer No results for input(s): DDIMER in the last 168 hours.   Radiology  No results found.  Cardiac Studies  03/12/2024 Echo complete 1. Left ventricular ejection fraction, by estimation, is 55 to 60%. The  left ventricle has normal function. Left ventricular endocardial border  not optimally defined to evaluate regional wall motion. Left ventricular  diastolic parameters are consistent  with Grade I diastolic dysfunction (impaired relaxation).   2. Right ventricular systolic function is normal. The right ventricular  size is mildly enlarged. Tricuspid regurgitation signal is inadequate for  assessing PA pressure.   3. The mitral valve was not well visualized. No evidence of mitral valve  regurgitation. No evidence of mitral stenosis.   4.  The aortic valve was not well visualized. Aortic valve regurgitation  is not visualized. No aortic stenosis is present.   5. Challenging image quality.   Patient Profile   73 y.o. 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   Assessment & Plan   Atrial tachycardia - Brief paroxysms of narrow complex tachycardia consistent with atrial tachycardia on telemetry - Initially thought to be atrial fibrillation, although EKG and telemetry are not consistent with this. Eliquis  was discontinued as no evidence of atrial fibrillation.  - Likely in the setting of untreated sleep apnea - Recommend K > 4 and mag > 2 - Burden overall improved - Continue diltiazem CD 120 mg daily  Acute on chronic diastolic CHF - Likely exacerbated by morbid obesity, untreated sleep apnea, and possible OHS - Echo this admission with preserved LV function, G1DD - Volume status is difficult to assess due to body habitus - Treated with IV Lasix  earlier in admission - Defer addition of SGLT2i given morbid obesity and concern for off target effect - Consider addition of MRA prior to discharge if kidney function allows  CAD - No chest pain - Negative troponin x2 - Continue ASA and pravastatin  - No indication for ischemic evaluation at this time  CKD II - Kidney function improved with diuresis  Morbid obesity with untreated OSA and likely OHS - Previously did not tolerate CPAP - Likely contributing to atrial tachycardia and CHF as above - Recommend weight loss - Will need outpatient sleep medicine follow up - Would likely benefit from healthy weight and wellness center and bariatric eval as  outpatient  For questions or updates, please contact [facility] Please consult [email or web address] for contact info under     Signed, Lesley LITTIE Maffucci, PA-C  03/15/2024, 10:45 AM

## 2024-03-15 NOTE — Progress Notes (Signed)
 Physical Therapy Treatment Patient Details Name: Alfred Carney MRN: [id number] DOB: [birth year] Today's Date: 03/15/2024   History of Present Illness 73 year old male with history of non-insulin -dependent diabetes mellitus, PAD, hyperlipidemia, hypertension, history of gout, BPH, COPD on chronic 2 L nasal cannula at baseline, history of heart failure preserved ejection fraction, who presents emergency department for chief concerns of weakness for 10 days.    PT Comments  Today's treatment involved bed mobility and seated therex. Awaiting imaging results of R knee, standing acitivities deferred this date. Pt vitals stable on 5L of O2, sats remaining above 94% throughout session.Pt continuing to require multiple verbal and tactile cues for initiation of mobility, requiring increased time to complete movement. Supine <>sit transfer requiring min a, use of hand rail and elevated HOB. Seated LE therex performed to increase overall strength, good carry-over. Pt will continue to benefit from skilled PT interventions to improve QoL. PT to follow acutely.   If plan is discharge home, recommend the following: A lot of help with walking and/or transfers;A lot of help with bathing/dressing/bathroom;Assistance with cooking/housework;Assist for transportation;Help with stairs or ramp for entrance   Can travel by private vehicle     No  Equipment Recommendations  None recommended by PT    Recommendations for Other Services       Precautions / Restrictions Precautions Precautions: Fall Recall of Precautions/Restrictions: Intact Restrictions Weight Bearing Restrictions Per Provider Order: No     Mobility  Bed Mobility Overal bed mobility: Needs Assistance Bed Mobility: Supine to Sit, Sit to Supine     Supine to sit: Min assist, Used rails Sit to supine: Mod assist, +2 for physical assistance   General bed mobility comments: min a with increased time to complete supine <>sit movement, verbal/tactile  cueing on hand rail use; mod x2 for sit<>supine for repositioning, vitals remained above 94% on 5L    Transfers                   General transfer comment: deferred this date d/t no x-ray results of R knee    Ambulation/Gait               General Gait Details: deferred this date d/t no x-ray results of R knee   Stairs             Wheelchair Mobility     Tilt Bed    Modified Rankin (Stroke Patients Only)       Balance Overall balance assessment: Needs assistance Sitting-balance support: Feet supported, Bilateral upper extremity supported Sitting balance-Leahy Scale: Fair Sitting balance - Comments: sitting EOB                                    Communication Communication Communication: Impaired Factors Affecting Communication: Hearing impaired  Cognition Arousal: Alert Behavior During Therapy: WFL for tasks assessed/performed   PT - Cognitive impairments: No apparent impairments                         Following commands: Intact      Cueing Cueing Techniques: Verbal cues, Tactile cues  Exercises General Exercises - Lower Extremity Long Arc Quad: AROM, Both, Seated, 20 reps Hip ABduction/ADduction: AROM, Both, Seated, 20 reps Hip Flexion/Marching: AROM, Both, 20 reps, Seated    General Comments        Pertinent Vitals/Pain Pain Assessment Pain Assessment: Faces Faces Pain Scale:  Hurts little more Pain Location: B ankles Pain Descriptors / Indicators: Aching, Discomfort Pain Intervention(s): Repositioned, Monitored during session    Home Living                          Prior Function            PT Goals (current goals can now be found in the care plan section) Acute Rehab PT Goals Patient Stated Goal: to decrease pain and feel better PT Goal Formulation: With patient Time For Goal Achievement: 03/28/24 Potential to Achieve Goals: Good Progress towards PT goals: Progressing toward  goals    Frequency    Min 2X/week      PT Plan      Co-evaluation              AM-PAC PT 6 Clicks Mobility   Outcome Measure  Help needed turning from your back to your side while in a flat bed without using bedrails?: A Little Help needed moving from lying on your back to sitting on the side of a flat bed without using bedrails?: A Little Help needed moving to and from a bed to a chair (including a wheelchair)?: A Lot Help needed standing up from a chair using your arms (e.g., wheelchair or bedside chair)?: A Lot Help needed to walk in hospital room?: A Lot Help needed climbing 3-5 steps with a railing? : A Lot 6 Click Score: 14    End of Session Equipment Utilized During Treatment: Oxygen  Activity Tolerance: Patient tolerated treatment well Patient left: in bed;with call bell/phone within reach;with bed alarm set Nurse Communication: Mobility status PT Visit Diagnosis: Unsteadiness on feet (R26.81);Other abnormalities of gait and mobility (R26.89);Muscle weakness (generalized) (M62.81)     Time: 8489-8468 PT Time Calculation (min) (ACUTE ONLY): 21 min  Charges:                               Philipp Callegari Romero-Perozo, SPT  03/15/2024, 3:37 PM

## 2024-03-15 NOTE — Progress Notes (Signed)
 [facility]  - [place] at [facility]   PATIENT NAME: Alfred Carney    MR#:  [id number]  DATE OF BIRTH:  April 12, 1951  SUBJECTIVE:  no family at bedside. Heart rate much improved.  Patient use CPAP last night and was able to tolerated very well. Complains of generalized body ache knee pain and ankle pain. Right knee bothering. No redness of fever. VITALS:  Blood pressure (!) 107/52, pulse 93, temperature 98 F (36.7 C), resp. rate 20, height 5' 9.5 (1.765 m), weight 129.8 kg, SpO2 96%.  PHYSICAL EXAMINATION:   GENERAL:  73 y.o.-year-old patient with no acute distress. Morbidly obese LUNGS: distant breath sounds bilaterally, no wheezing CARDIOVASCULAR: S1, S2 normal. No murmur, ABDOMEN: Soft, nontender Abdominal obesity EXTREMITIES: chronic edema b/l.    NEUROLOGIC: nonfocal  patient is alert and awake.   LABORATORY PANEL:  CBC Recent Labs  Lab 03/12/24 0536  WBC 16.4*  HGB 12.8*  HCT 39.9  PLT 356    Chemistries  Recent Labs  Lab 03/11/24 0818 03/12/24 0536 03/13/24 0836  NA 138   < > 138  K 3.1*   < > 3.5  CL 94*   < > 94*  CO2 30   < > 33*  GLUCOSE 121*   < > 168*  BUN 19   < > 23  CREATININE 1.43*   < > 1.28*  CALCIUM  8.9   < > 8.4*  MG 2.1  --   --   AST 37  --   --   ALT 30  --   --   ALKPHOS 89  --   --   BILITOT 1.1  --   --    < > = values in this interval not displayed.   Cardiac Enzymes No results for input(s): TROPONINI in the last 168 hours. RADIOLOGY:  No results found.   Assessment and Plan Alfred Carney is a 73 year old male with history of non-insulin -dependent diabetes mellitus, PAD, hyperlipidemia, hypertension, history of gout, BPH, COPD on chronic 2 L nasal cannula at baseline, history of heart failure preserved ejection fraction, who presents emergency department for chief concerns of weakness for 10 days.   Paroxysmal atrial tachycardia acute diastolic congestive heart failure -- initial EKG showed atrial  fibrillation however rhythm strip evaluated by cardiology shows PACs with sinus tachycardia -- will continue Cardizem 30 mg Q8 and PRN IV metoprolol -- change to Cardizem CD -- echo EF55-60%     -- [facility] cardiology input noted. Appears secondary to untreated sleep apnea/obesity hypoventilation syndrome -- IV Lasix -- with good urine output-- Lasix  discontinued. -- Patient started using CPAP at nighttime. Tolerating it well. Advised to consider buying similar kind of mask for CPAP at home. This was discussed with patient's daughter at length  Leukocytosis possible UTI -- patient not able to give any symptoms. However has abnormal UA and with weakness will treat for three doses of Rocephin . -- Urine culture negative  Chronic respiratory failure with chronic emphysema and untreated sleep apnea -- patient wears 2 L chronically oxygen  -- PRN nebulizer -- recommend reach out to pulmonary and see if different mask or nasal tubing can be used for CPAP.  Generalized weakness deconditioning/recent fall --  PT/OT--rehab. Pt very deconditioned  Type II diabetes with hyperglycemia -- will resume glipizide  -- continue sliding scale  Hyperlipidemia -- statins  Bilateral ankle, bilateral knee pain and back pain -- patient tells me he has history of gout. Already on allopurinol  -- will give PRN  tramadol, added oxycodone  --  uric acid 5.3 -- x-ray right knee  Procedures: Family communication : family in the ER Consults : [facility] cardiology CODE STATUS: full DVT Prophylaxis : Lovenox  Level of care: Telemetry Cardiac Status is: Inpatient Remains inpatient appropriate because: overall improving. Awaiting for rehab bed offer.    TOTAL TIME TAKING CARE OF THIS PATIENT: 35 minutes.  >50% time spent on counselling and coordination of care  Note: This dictation was prepared with Dragon dictation along with smaller phrase technology. Any transcriptional errors that result from this process are  unintentional.  Leita Blanch M.D    [facility]   CC: Primary care physician; Corwin Antu, FNP

## 2024-03-15 NOTE — Progress Notes (Signed)
 Heart Failure Stewardship Pharmacy Note  PCP: Corwin Antu, FNP PCP-Cardiologist: Peter Swaziland, MD  HPI: Alfred Carney is a 73 y.o. male with non-insulin -dependent diabetes mellitus, PAD, hyperlipidemia, hypertension, history of gout, BPH, OSA, COPD on chronic 2 L nasal cannula at baseline, history of heart failure preserved ejection fraction who presented with weakness over ~2 weeks and recent fall.  On admission, BNP was 116.1, HS-troponin was 16, lactic acid 1.2, uric acid 5.5, and TSH was 1.339. Chest x-ray noted emphysema.   Pertinent cardiac history: Normal Lexiscan  Myoview  in 09/2011. Echo in 10/2018 noted LVEF of 60-65%. Echo in 05/2020 with LVEF unchanged and grade I diastolic dysfunction. Echo this admission with LVEF of 55-60%, and grade I diastolic dysfunction.  Pertinent Lab Values: Creat  Date Value Ref Range Status  01/13/2015 1.16 0.50 - 1.35 mg/dL Final   Creatinine, Ser  Date Value Ref Range Status  03/13/2024 1.28 (H) 0.61 - 1.24 mg/dL Final   BUN  Date Value Ref Range Status  03/13/2024 23 8 - 23 mg/dL Final   Potassium  Date Value Ref Range Status  03/13/2024 3.5 3.5 - 5.1 mmol/L Final   Sodium  Date Value Ref Range Status  03/13/2024 138 135 - 145 mmol/L Final   B Natriuretic Peptide  Date Value Ref Range Status  03/11/2024 116.1 (H) 0.0 - 100.0 pg/mL Final    Comment:    Performed at [facility], [address] [place], [place] [address]   Magnesium  Date Value Ref Range Status  03/11/2024 2.1 1.7 - 2.4 mg/dL Final    Comment:    Performed at [facility], [address] [place], [place] [address]   Hgb A1c MFr Bld  Date Value Ref Range Status  11/18/2023 7.0 (H) 4.6 - 6.5 % Final    Comment:    Glycemic Control Guidelines for People with Diabetes:Non Diabetic:  <6%Goal of Therapy: <7%Additional Action Suggested:  >8%    TSH  Date Value Ref Range Status  03/11/2024 1.339 0.350 - 4.500 uIU/mL Final    Comment:     Performed by a 3rd Generation assay with a functional sensitivity of <=0.01 uIU/mL. Performed at [facility], [address] [place], [place] [address]   11/19/2017 3.74 0.35 - 4.50 uIU/mL Final    Vital Signs: Temp:  [97.7 F (36.5 C)-99.2 F (37.3 C)] 98.5 F (36.9 C) (06/30 0802) Pulse Rate:  [69-101] 71 (06/30 0802) Cardiac Rhythm: Normal sinus rhythm (06/29 1900) Resp:  [16-20] 20 (06/30 0503) BP: (102-148)/(44-86) 116/60 (06/30 0802) SpO2:  [92 %-97 %] 96 % (06/30 0802) Weight:  [129.8 kg (286 lb 2.5 oz)] 129.8 kg (286 lb 2.5 oz) (06/30 0500)  Intake/Output Summary (Last 24 hours) at 03/15/2024 0805 Last data filed at 03/15/2024 0600 Gross per 24 hour  Intake 120 ml  Output 700 ml  Net -580 ml    Current Heart Failure Medications:  Loop diuretic: none Beta-Blocker: none ACEI/ARB/ARNI: irbesartan  300 mg daily MRA: none SGLT2i: none Other: diltiazem 120 mg daily  Prior to admission Heart Failure Medications:  Loop diuretic: furosemide  40 mg daily Beta-Blocker: metoprolol  succinate 25 mg daily ACEI/ARB/ARNI: irbesartan  300 mg daily MRA: none SGLT2i: none Other: none  Assessment: 1. Acute on chronic diastolic heart failure (LVEF 55-60%) with grade I diastolic dysfunction, due to NICM. NYHA class III symptoms.  -Symptoms: Patient is not short of breath at rest, though requiring oxygen . Not very mobile due to obesity. Denies swelling today. -Volume: Volume is  difficult to assess due to body habitus. Not currently requiring diuretics.  -Hemodynamics: BP variable, at goal this AM. HR 70s. -SGLT2i: Would benefit CHF, but may not be ideal given body habitus and poor mobility. A1c 7. Would defer to outpatient. -MRA: Can consider adding spironolactone and decreasing irbesartan  since spironolactone has benefit in HFpEF. Would repeat BMET before adding to ensure renal function and K are stable. -ARNI: Can consider transition from irbesartan  to [facility] pending cost,  though not solidly within the cohort that generally benefits from ARNI in HFpEF.  Plan: 1) Medication changes recommended at this time: -None   2) Patient assistance: -Pending  3) Education: - Patient has been educated on current HF medications and potential additions to HF medication regimen - Patient verbalizes understanding that over the next few months, these medication doses may change and more medications may be added to optimize HF regimen - Patient has been educated on basic disease state pathophysiology and goals of therapy  Medication Assistance / Insurance Benefits Check: Does the patient have prescription insurance?    Type of insurance plan:  Does the patient qualify for medication assistance through manufacturers or grants? Pending   Outpatient Pharmacy: Prior to admission outpatient pharmacy: [facility]      Please do not hesitate to reach out with questions or concerns,  Jaun Bash, PharmD, CPP, BCPS Heart Failure Pharmacist  Phone - [phone] 03/15/2024 12:29 PM

## 2024-03-15 NOTE — Plan of Care (Signed)

## 2024-03-15 NOTE — Discharge Instructions (Signed)

## 2024-03-15 NOTE — TOC Progression Note (Signed)
 Transition of Care [facility]) - Progression Note    Patient Details  Name: Alfred Carney MRN: [id number] Date of Birth: 1950/12/30  Transition of Care [facility] CM/SW Contact  [place] L Naz[place]ah [place], [place] Phone Number: 03/15/2024, 9:01 AM  Clinical Narrative:     CSW spoke with patients spouse, Mrs. Khachatryan. Mrs. Morua advised that she would like [facility] to be the preferred facility. No other preferences were discussed. CSW was advised to locate placement in [place] area.    Expected Discharge Plan: Skilled Nursing Facility    Expected Discharge Plan and Services                           DME Agency: [employer]                   Social Determinants of Health (SDOH) Interventions SDOH Screenings   Food Insecurity: No Food Insecurity (03/12/2024)  Housing: Low Risk  (03/12/2024)  Transportation Needs: No Transportation Needs (03/12/2024)  Utilities: Not At Risk (03/12/2024)  Alcohol Screen: Low Risk  (05/09/2023)  Depression (PHQ2-9): Medium Risk (11/18/2023)  Financial Resource Strain: Low Risk  (05/27/2023)  Physical Activity: Insufficiently Active (05/27/2023)  Social Connections: Moderately Integrated (03/12/2024)  Stress: No Stress Concern Present (05/27/2023)  Tobacco Use: Medium Risk (03/11/2024)  Health Literacy: Adequate Health Literacy (05/12/2023)    Readmission Risk Interventions     No data to display

## 2024-03-15 NOTE — TOC Progression Note (Signed)
 Transition of Care [facility]) - Progression Note    Patient Details  Name: Alfred Carney MRN: [id number] Date of Birth: [birth year]  Transition of Care [facility]) CM/SW Contact  [place] W[place], [place] Phone Number: 03/15/2024, 9:36 AM  Clinical Narrative:     CSW sent out requests for bed offers in [place].   Expected Discharge Plan: Skilled Nursing Facility    Expected Discharge Plan and Services                           DME Agency: [employer]                   Social Determinants of Health (SDOH) Interventions SDOH Screenings   Food Insecurity: No Food Insecurity (03/12/2024)  Housing: Low Risk  (03/12/2024)  Transportation Needs: No Transportation Needs (03/12/2024)  Utilities: Not At Risk (03/12/2024)  Alcohol Screen: Low Risk  (05/09/2023)  Depression (PHQ2-9): Medium Risk (11/18/2023)  Financial Resource Strain: Low Risk  (05/27/2023)  Physical Activity: Insufficiently Active (05/27/2023)  Social Connections: Moderately Integrated (03/12/2024)  Stress: No Stress Concern Present (05/27/2023)  Tobacco Use: Medium Risk (03/11/2024)  Health Literacy: Adequate Health Literacy (05/12/2023)    Readmission Risk Interventions     No data to display

## 2024-03-15 NOTE — Plan of Care (Signed)
  Problem: Education: Goal: Ability to describe self-care measures that may prevent or decrease complications (Diabetes Survival Skills Education) will improve Outcome: Progressing   Problem: Fluid Volume: Goal: Ability to maintain a balanced intake and output will improve Outcome: Progressing   Problem: Metabolic: Goal: Ability to maintain appropriate glucose levels will improve Outcome: Progressing   Problem: Tissue Perfusion: Goal: Adequacy of tissue perfusion will improve Outcome: Progressing   Problem: Clinical Measurements: Goal: Cardiovascular complication will be avoided Outcome: Progressing   Plan of care ongoing, see MAR see flowsheet

## 2024-03-16 ENCOUNTER — Inpatient Hospital Stay

## 2024-03-16 ENCOUNTER — Other Ambulatory Visit (HOSPITAL_COMMUNITY): Payer: Self-pay

## 2024-03-16 ENCOUNTER — Encounter: Payer: Self-pay | Admitting: Internal Medicine

## 2024-03-16 DIAGNOSIS — I872 Venous insufficiency (chronic) (peripheral): Secondary | ICD-10-CM

## 2024-03-16 DIAGNOSIS — I4719 Other supraventricular tachycardia: Secondary | ICD-10-CM

## 2024-03-16 DIAGNOSIS — M25561 Pain in right knee: Secondary | ICD-10-CM

## 2024-03-16 DIAGNOSIS — N1831 Chronic kidney disease, stage 3a: Secondary | ICD-10-CM

## 2024-03-16 DIAGNOSIS — I509 Heart failure, unspecified: Secondary | ICD-10-CM | POA: Diagnosis not present

## 2024-03-16 LAB — BASIC METABOLIC PANEL WITH GFR
Anion gap: 11 (ref 5–15)
BUN: 30 mg/dL — ABNORMAL HIGH (ref 8–23)
CO2: 34 mmol/L — ABNORMAL HIGH (ref 22–32)
Calcium: 8.9 mg/dL (ref 8.9–10.3)
Chloride: 96 mmol/L — ABNORMAL LOW (ref 98–111)
Creatinine, Ser: 1.02 mg/dL (ref 0.61–1.24)
GFR, Estimated: >60 mL/min (ref >60)
Glucose, Bld: 191 mg/dL — ABNORMAL HIGH (ref 70–99)
Potassium: 4.5 mmol/L (ref 3.5–5.1)
Sodium: 141 mmol/L (ref 135–145)

## 2024-03-16 LAB — GLUCOSE, CAPILLARY
Glucose-Capillary: 145 mg/dL — ABNORMAL HIGH (ref 70–99)
Glucose-Capillary: 221 mg/dL — ABNORMAL HIGH (ref 70–99)
Glucose-Capillary: 156 mg/dL — ABNORMAL HIGH (ref 70–99)
Glucose-Capillary: 188 mg/dL — ABNORMAL HIGH (ref 70–99)

## 2024-03-16 LAB — URIC ACID: Uric Acid, Serum: 5.1 mg/dL (ref 3.7–8.6)

## 2024-03-16 MED ORDER — FUROSEMIDE 40 MG PO TABS
40.0000 mg | ORAL_TABLET | Freq: Every day | ORAL | Status: DC
  Administered 2024-03-16 – 2024-03-18 (×3): 40 mg via ORAL
  Filled 2024-03-16 (×3): qty 1

## 2024-03-16 NOTE — TOC Progression Note (Signed)
 Transition of Care [facility]) - Progression Note    Patient Details  Name: Alfred Carney MRN: [id number] Date of Birth: 09-Jun-1951  Transition [facility] Care [facility]) CM/SW Contact  Tomasa JAYSON Childes, RN Phone Number: 03/16/2024, 9:40 AM  Clinical Narrative:    Attempt to reach [facility] in admissions at [facility]. No answer. Left a message regarding referral sent last week and status update.    Expected Discharge Plan: Skilled Nursing Facility    Expected Discharge Plan and Services                           DME Agency: [employer]                   Social Determinants of Health (SDOH) Interventions SDOH Screenings   Food Insecurity: No Food Insecurity (03/12/2024)  Housing: Low Risk  (03/12/2024)  Transportation Needs: No Transportation Needs (03/12/2024)  Utilities: Not At Risk (03/12/2024)  Alcohol Screen: Low Risk  (05/09/2023)  Depression (PHQ2-9): Medium Risk (11/18/2023)  Financial Resource Strain: Low Risk  (05/27/2023)  Physical Activity: Insufficiently Active (05/27/2023)  Social Connections: Moderately Integrated (03/12/2024)  Stress: No Stress Concern Present (05/27/2023)  Tobacco Use: Medium Risk (03/11/2024)  Health Literacy: Adequate Health Literacy (05/12/2023)    Readmission Risk Interventions     No data to display

## 2024-03-16 NOTE — Progress Notes (Signed)
 Rounding Note   Patient Name: Alfred Carney Date of Encounter: 03/16/2024  [facility] Cardiologist: Peter Swaziland, MD   Subjective Patient denies chest pain, shortness of breath, and palpitations. He reports some gargling in his chest this morning. Telemetry revealed one short run of atrial tachycardia. Overall burden is greatly improved. He continues to use CPAP at night.  Remains on 4 L supplemental O2 during the day.   Scheduled Meds:  allopurinol   200 mg Oral Daily   aspirin  EC  81 mg Oral Daily   cholecalciferol   1,000 Units Oral Daily   diltiazem  120 mg Oral Daily   enoxaparin  (LOVENOX ) injection  65 mg Subcutaneous Q24H   feeding supplement  237 mL Oral BID BM   insulin  aspart  0-15 Units Subcutaneous TID WC   insulin  aspart  0-5 Units Subcutaneous QHS   irbesartan   300 mg Oral Daily   lidocaine   1 patch Transdermal Q24H   pravastatin   40 mg Oral q1800   pregabalin  50 mg Oral Daily   tamsulosin   0.4 mg Oral QHS   Continuous Infusions:  PRN Meds: acetaminophen  **OR** acetaminophen , albuterol , calcium  carbonate **AND** simethicone , metoprolol  tartrate, nitroGLYCERIN , ondansetron  **OR** ondansetron  (ZOFRAN ) IV, oxyCODONE , traMADol   Vital Signs  Vitals:   03/16/24 0436 03/16/24 0500 03/16/24 0823 03/16/24 0903  BP: 111/70  (!) 152/50 (!) 152/50  Pulse: 68  72   Resp: 18     Temp: (!) 97.5 F (36.4 C)  (!) 97.5 F (36.4 C)   TempSrc:      SpO2: 99%  95%   Weight:  128.9 kg    Height:        Intake/Output Summary (Last 24 hours) at 03/16/2024 1002 Last data filed at 03/16/2024 0420 Gross per 24 hour  Intake 480 ml  Output 1450 ml  Net -970 ml      03/16/2024    5:00 AM 03/15/2024    5:00 AM 03/14/2024    5:00 AM  Last 3 Weights  Weight (lbs) 284 lb 2.8 oz 286 lb 2.5 oz 289 lb 3.9 oz  Weight (kg) 128.9 kg 129.8 kg 131.2 kg      Telemetry Sinus rhythm with PACs, rate 60-80 bpm, one brief run of PAT lasting 15 beats - Personally  Reviewed  Physical Exam  GEN: No acute distress.   Neck: Unable to assess JVD due to body habitus Cardiac: RRR, no murmurs, rubs, or gallops.  Respiratory: Diminished breath sounds bilaterally. GI: Soft, nontender, non-distended  MS: Trace LE edema; No deformity. Neuro:  Nonfocal  Psych: Normal affect   Labs High Sensitivity Troponin:   Recent Labs  Lab 03/11/24 0818 03/11/24 0952  TROPONINIHS 16 13     Chemistry Recent Labs  Lab 03/11/24 0818 03/12/24 0536 03/13/24 0836 03/16/24 0328  NA 138 140 138 141  K 3.1* 3.6 3.5 4.5  CL 94* 96* 94* 96*  CO2 30 32 33* 34*  GLUCOSE 121* 165* 168* 191*  BUN 19 23 23  30*  CREATININE 1.43* 1.33* 1.28* 1.02  CALCIUM  8.9 8.4* 8.4* 8.9  MG 2.1  --   --   --   PROT 7.2  --   --   --   ALBUMIN  2.8*  --   --   --   AST 37  --   --   --   ALT 30  --   --   --   ALKPHOS 89  --   --   --  BILITOT 1.1  --   --   --   GFRNONAA 51* 56* 59* >60  ANIONGAP 14 12 11 11     Lipids No results for input(s): CHOL, TRIG, HDL, LABVLDL, LDLCALC, CHOLHDL in the last 168 hours.  Hematology Recent Labs  Lab 03/11/24 0818 03/12/24 0536  WBC 18.7* 16.4*  RBC 4.73 4.42  HGB 13.6 12.8*  HCT 42.9 39.9  MCV 90.7 90.3  MCH 28.8 29.0  MCHC 31.7 32.1  RDW 14.5 14.6  PLT 357 356   Thyroid   Recent Labs  Lab 03/11/24 0952  TSH 1.339    BNP Recent Labs  Lab 03/11/24 0818  BNP 116.1*    DDimer No results for input(s): DDIMER in the last 168 hours.   Radiology  DG Knee Complete 4 Views Right Result Date: 03/15/2024 CLINICAL DATA:  Pain after fall EXAM: RIGHT KNEE - COMPLETE 4+ VIEW COMPARISON:  None Available. FINDINGS: No definitive fracture or malalignment. Chondrocalcinosis. Positive for knee effusion. Relatively patent joint spaces IMPRESSION: 1. No definitive acute osseous abnormality. Knee effusion. Cross-sectional imaging follow-up may be performed if high suspicion for acute osseous injury. Electronically Signed   By: Luke Bun M.D.   On: 03/15/2024 16:12    Cardiac Studies  03/12/2024 Echo complete 1. Left ventricular ejection fraction, by estimation, is 55 to 60%. The  left ventricle has normal function. Left ventricular endocardial border  not optimally defined to evaluate regional wall motion. Left ventricular  diastolic parameters are consistent  with Grade I diastolic dysfunction (impaired relaxation).   2. Right ventricular systolic function is normal. The right ventricular  size is mildly enlarged. Tricuspid regurgitation signal is inadequate for  assessing PA pressure.   3. The mitral valve was not well visualized. No evidence of mitral valve  regurgitation. No evidence of mitral stenosis.   4. The aortic valve was not well visualized. Aortic valve regurgitation  is not visualized. No aortic stenosis is present.   5. Challenging image quality.   Patient Profile   73 y.o. 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   Assessment & Plan   Atrial tachycardia - Brief paroxysms of narrow complex tachycardia consistent with atrial tachycardia on telemetry - Initially thought to be atrial fibrillation, although EKG and telemetry are not consistent with this. Eliquis  was discontinued as no evidence of atrial fibrillation.  - In the setting of untreated sleep apnea, improving with CPAP compliance - Recommend K > 4 and mag > 2 - Burden overall improved - Continue diltiazem CD 120 mg daily  Acute on chronic diastolic CHF - Likely exacerbated by morbid obesity, untreated sleep apnea, and possible OHS - Echo this admission with preserved LV function, G1DD - Volume status is difficult to  assess due to body habitus - Treated with IV Lasix  earlier in admission - Continue irbesartan  300 mg daily - Defer addition of SGLT2i given morbid obesity and concern for off target effect - Consider addition of MRA prior to discharge if kidney function and K allows  CAD - No chest pain - Negative troponin x2 - Continue ASA and pravastatin  - No indication for ischemic evaluation at this time  CKD II - Kidney function improved with diuresis  Morbid obesity with untreated OSA and likely OHS - Previously did not tolerate CPAP - Likely contributing to atrial tachycardia and CHF as above - Recommend weight loss - Will need outpatient sleep medicine follow up - Would likely benefit from healthy weight and wellness center and bariatric eval as outpatient  For questions or updates, please contact [facility] Please consult [email or web address] for contact info under     Signed, Lesley LITTIE Maffucci, PA-C  03/16/2024, 10:02 AM

## 2024-03-16 NOTE — Progress Notes (Signed)
 Occupational Therapy Treatment Patient Details Name: Alfred Carney MRN: [id number] DOB: [birth year] Today's Date: 03/16/2024   History of present illness 73 year old male with history of non-insulin -dependent diabetes mellitus, PAD, hyperlipidemia, hypertension, history of gout, BPH, COPD on chronic 2 L nasal cannula at baseline, history of heart failure preserved ejection fraction, who presents emergency department for chief concerns of weakness for 10 days.   OT comments  Pt is supine in bed on arrival. Pleasant and agreeable to OT session. He reports R knee and ankle pain at 8/10. Pt agreeable to ADL session for bathing/dressing. UB bathing performed at bed level with set up assist, as well as anterior peri-care. Pt required CGA for supine to sit at EOB and was able to maintain good seated balance during LB bathing of his thighs/knees with SBA. Pt agreeable to attempt standing with bed height elevated and Max A to stand ~5-8 seconds with heavy reliance on RW. Pt does endorse dizziness, but subsides once returned to supine which required Mod A for BLE management. Pt able to roll to bil sides in bed with Mod A for buttocks to be cleaned. Pt able to scoot self up in bed with it in trendelenburg position and using headboard rails. Sp02 lowest of 91% on 02 and HR stable. There was no change in pt pain with activity during session and nurse is aware. Pt returned to bed with all needs in place and will cont to require skilled acute OT services to maximize his safety and IND to return to PLOF.       If plan is discharge home, recommend the following:  Two people to help with walking and/or transfers;A lot of help with bathing/dressing/bathroom;Help with stairs or ramp for entrance;Assist for transportation;Assistance with cooking/housework   Equipment Recommendations  Other (comment) (defer)    Recommendations for Other Services      Precautions / Restrictions Precautions Precautions: Fall Recall of  Precautions/Restrictions: Intact Restrictions Weight Bearing Restrictions Per Provider Order: No       Mobility Bed Mobility Overal bed mobility: Needs Assistance Bed Mobility: Supine to Sit, Sit to Supine, Rolling Rolling: Mod assist, Min assist   Supine to sit: Contact guard, Used rails, HOB elevated Sit to supine: Mod assist   General bed mobility comments: Mod A for BLE management to return to supine; able to sit at EOB with CGA to SBA this date to perform LB bathing tasks; Mod A to roll to bil sides in bed for further peri-care    Transfers Overall transfer level: Needs assistance Equipment used: Rolling walker (2 wheels) Transfers: Sit to/from Stand Sit to Stand: From elevated surface, Max assist           General transfer comment: able to perform x1 STS from EOB to RW with Max A from elevated bed height for ~5-8 seconds     Balance Overall balance assessment: Needs assistance   Sitting balance-Leahy Scale: Fair Sitting balance - Comments: sitting EOB to perform LB bathing with CGA/SBA   Standing balance support: Reliant on assistive device for balance, Bilateral upper extremity supported Standing balance-Leahy Scale: Poor Standing balance comment: RW BUE support heavily                           ADL either performed or assessed with clinical judgement   ADL Overall ADL's : Needs assistance/impaired     Grooming: Wash/dry face;Bed level;Set up;Applying deodorant Grooming Details (indicate cue type and reason): set up  assist Upper Body Bathing: Set up;Bed level   Lower Body Bathing: Sitting/lateral leans;Moderate assistance Lower Body Bathing Details (indicate cue type and reason): seated at EOB able to don thighs/knees; returned to supine and rolled to bil sides for buttocks cleaning; able to do his own anterior peri-care at bed level Upper Body Dressing : Contact guard assist;Sitting Upper Body Dressing Details (indicate cue type and reason):  EOB                        Extremity/Trunk Assessment              Vision       Perception     Praxis     Communication Communication Communication: Impaired Factors Affecting Communication: Hearing impaired   Cognition Arousal: Alert Behavior During Therapy: WFL for tasks assessed/performed                                 Following commands: Intact        Cueing   Cueing Techniques: Verbal cues, Tactile cues  Exercises      Shoulder Instructions       General Comments sp02 91% after activity on 02, HR stable    Pertinent Vitals/ Pain       Pain Assessment Pain Assessment: 0-10 Pain Score: 8  Pain Location: R ankle and knee Pain Descriptors / Indicators: Aching, Discomfort Pain Intervention(s): Monitored during session, Repositioned  Home Living                                          Prior Functioning/Environment              Frequency  Min 2X/week        Progress Toward Goals  OT Goals(current goals can now be found in the care plan section)  Progress towards OT goals: Progressing toward goals  Acute Rehab OT Goals Patient Stated Goal: decrease pain OT Goal Formulation: With patient Time For Goal Achievement: 03/27/24 Potential to Achieve Goals: Fair  Plan      Co-evaluation                 AM-PAC OT 6 Clicks Daily Activity     Outcome Measure   Help from another person eating meals?: None Help from another person taking care of personal grooming?: A Little Help from another person toileting, which includes using toliet, bedpan, or urinal?: Total Help from another person bathing (including washing, rinsing, drying)?: A Lot Help from another person to put on and taking off regular upper body clothing?: A Little Help from another person to put on and taking off regular lower body clothing?: A Lot 6 Click Score: 15    End of Session Equipment Utilized During Treatment: Rolling  walker (2 wheels);Oxygen   OT Visit Diagnosis: Unsteadiness on feet (R26.81);Repeated falls (R29.6);History of falling (Z91.81)   Activity Tolerance Patient limited by pain;Patient tolerated treatment well   Patient Left in bed;with call bell/phone within reach;with bed alarm set;with family/visitor present   Nurse Communication Mobility status        Time: 9057-8976 OT Time Calculation (min): 41 min  Charges: OT General Charges $OT Visit: 1 Visit OT Treatments $Self Care/Home Management : 38-52 mins  Shaniece Bussa, OTR/L  03/16/24, 1:47 PM   Daylin Eads E  Tauriel Scronce 03/16/2024, 1:39 PM

## 2024-03-16 NOTE — Plan of Care (Signed)
  Problem: Education: Goal: Individualized Educational Video(s) Outcome: Progressing   Problem: Fluid Volume: Goal: Ability to maintain a balanced intake and output will improve Outcome: Progressing   Problem: Health Behavior/Discharge Planning: Goal: Ability to identify and utilize available resources and services will improve Outcome: Progressing Goal: Ability to manage health-related needs will improve Outcome: Progressing   Problem: Metabolic: Goal: Ability to maintain appropriate glucose levels will improve Outcome: Progressing   Problem: Nutritional: Goal: Maintenance of adequate nutrition will improve Outcome: Progressing Goal: Progress toward achieving an optimal weight will improve Outcome: Progressing   Problem: Skin Integrity: Goal: Risk for impaired skin integrity will decrease Outcome: Progressing   Problem: Tissue Perfusion: Goal: Adequacy of tissue perfusion will improve Outcome: Progressing   Problem: Education: Goal: Knowledge of General Education information will improve Description: Including pain rating scale, medication(s)/side effects and non-pharmacologic comfort measures Outcome: Progressing   Problem: Health Behavior/Discharge Planning: Goal: Ability to manage health-related needs will improve Outcome: Progressing   Problem: Clinical Measurements: Goal: Ability to maintain clinical measurements within normal limits will improve Outcome: Progressing Goal: Will remain free from infection Outcome: Progressing Goal: Diagnostic test results will improve Outcome: Progressing Goal: Respiratory complications will improve Outcome: Progressing Goal: Cardiovascular complication will be avoided Outcome: Progressing   Problem: Activity: Goal: Risk for activity intolerance will decrease Outcome: Progressing   Problem: Nutrition: Goal: Adequate nutrition will be maintained Outcome: Progressing   Problem: Coping: Goal: Level of anxiety will  decrease Outcome: Progressing   Problem: Elimination: Goal: Will not experience complications related to bowel motility Outcome: Progressing Goal: Will not experience complications related to urinary retention Outcome: Progressing   Problem: Pain Managment: Goal: General experience of comfort will improve and/or be controlled Outcome: Progressing   Problem: Safety: Goal: Ability to remain free from injury will improve Outcome: Progressing   Problem: Skin Integrity: Goal: Risk for impaired skin integrity will decrease Outcome: Progressing   Problem: Education: Goal: Ability to demonstrate management of disease process will improve Outcome: Progressing Goal: Ability to verbalize understanding of medication therapies will improve Outcome: Progressing Goal: Individualized Educational Video(s) Outcome: Progressing   Problem: Activity: Goal: Capacity to carry out activities will improve Outcome: Progressing   Problem: Cardiac: Goal: Ability to achieve and maintain adequate cardiopulmonary perfusion will improve Outcome: Progressing

## 2024-03-16 NOTE — Progress Notes (Signed)
 [facility]  - [place] [facility]   PATIENT NAME: Alfred Carney    MR#:  [id number]  DATE OF BIRTH:  04/07/1951  SUBJECTIVE:  Alfred Carney  at bedside. Heart rate much improved.  Patient use CPAP last night and was able to tolerated very well.  Right knee bothering. No redness of fever. VITALS:  Blood pressure (!) 126/49, pulse 73, temperature 98.3 F (36.8 C), resp. rate 18, height 5' 9.5 (1.765 m), weight 128.9 kg, SpO2 95%.  PHYSICAL EXAMINATION:   GENERAL:  73 y.o.-year-old patient with no acute distress. Morbidly obese LUNGS: distant breath sounds bilaterally, no wheezing CARDIOVASCULAR: S1, S2 normal. No murmur, ABDOMEN: Soft, nontender Abdominal obesity EXTREMITIES: chronic edema b/l.   Right knee painful ROM NEUROLOGIC: nonfocal  patient is alert and awake.   LABORATORY PANEL:  CBC Recent Labs  Lab 03/12/24 0536  WBC 16.4*  HGB 12.8*  HCT 39.9  PLT 356    Chemistries  Recent Labs  Lab 03/11/24 0818 03/12/24 0536 03/16/24 0328  NA 138   < > 141  K 3.1*   < > 4.5  CL 94*   < > 96*  CO2 30   < > 34*  GLUCOSE 121*   < > 191*  Carney 19   < > 30*  CREATININE 1.43*   < > 1.02  CALCIUM  8.9   < > 8.9  MG 2.1  --   --   AST 37  --   --   ALT 30  --   --   ALKPHOS 89  --   --   BILITOT 1.1  --   --    < > = values in this interval not displayed.   Cardiac Enzymes No results for input(s): TROPONINI in the last 168 hours. RADIOLOGY:  DG Knee Complete 4 Views Right Result Date: 03/15/2024 CLINICAL DATA:  Pain after fall EXAM: RIGHT KNEE - COMPLETE 4+ VIEW COMPARISON:  None Available. FINDINGS: No definitive fracture or malalignment. Chondrocalcinosis. Positive for knee effusion. Relatively patent joint spaces IMPRESSION: 1. No definitive acute osseous abnormality. Knee effusion. Cross-sectional imaging follow-up may be performed if high suspicion for acute osseous injury. Electronically Signed   By: Alfred Carney M.D.   On: 03/15/2024 16:12      Assessment and Plan Alfred Carney is a 73 year old male with history of non-insulin -dependent diabetes mellitus, PAD, hyperlipidemia, hypertension, history of gout, BPH, COPD on chronic 2 L nasal cannula at baseline, history of heart failure preserved ejection fraction, who presents emergency department for chief concerns of weakness for 10 days.   Paroxysmal atrial tachycardia acute diastolic congestive heart failure -- initial EKG showed atrial fibrillation however rhythm strip evaluated by cardiology shows PACs with sinus tachycardia -- will continue Cardizem 30 mg Q8 and PRN IV metoprolol -- change to Cardizem CD -- echo EF55-60%     -- [facility] cardiology input noted. Appears secondary to untreated sleep apnea/obesity hypoventilation syndrome -- IV Lasix -- with good urine output-- Lasix  discontinued. -- Patient started using CPAP at nighttime. Tolerating it well. Advised to consider buying similar kind of mask for CPAP at home. This was discussed with patient's daughter at length  Leukocytosis possible UTI -- patient not able to give any symptoms. However has abnormal UA and with weakness will treat for three doses of Rocephin . -- Urine culture negative  Chronic respiratory failure with chronic emphysema and untreated sleep apnea -- patient wears 2 L chronically oxygen  -- PRN nebulizer -- recommend reach  out to pulmonary and see if different mask or nasal tubing can be used for CPAP.  Generalized weakness deconditioning/recent fall --  PT/OT--rehab. Pt very deconditioned  Type II diabetes with hyperglycemia -- will resume glipizide  -- continue sliding scale  Hyperlipidemia -- statins  Bilateral ankle, bilateral knee pain and back pain -- patient tells me he has history of gout. Already on allopurinol  -- PRN tramadol, added oxycodone  --  uric acid 5.3 -- x-ray right knee--knee effusion.  --Ortho consult with Dr Edie  Procedures: Family communication : Alfred Carney in the  room Consults : [facility] cardiology CODE STATUS: full DVT Prophylaxis : Lovenox  Level of care: Telemetry Cardiac Status is: Inpatient Remains inpatient appropriate because: overall improving. Awaiting for rehab bed offer.    TOTAL TIME TAKING CARE OF THIS PATIENT: 35 minutes.  >50% time spent on counselling and coordination of care  Note: This dictation was prepared with Dragon dictation along with smaller phrase technology. Any transcriptional errors that result from this process are unintentional.  Leita Blanch M.D    [facility]   CC: Primary care physician; Corwin Antu, FNP

## 2024-03-16 NOTE — Progress Notes (Signed)
 Heart Failure Nurse Navigator Progress Note  PCP: Corwin Antu, FNP PCP-Cardiologist: Redell Cave, MD Admission Diagnosis:  Weakness Dysuria New Onset A-fib (HCC) Acute on Chronic Congestive Heart Failure, unspecified heart failure type (HCC) Urinary tract infection without hematuria, site unspecified Admitted from: Home via EMS  Presentation:   Alfred Carney presented with history heart failure with preserved ejection fraction, COPD on 2L at baseline, CKD and was told he had obstructive sleep apnea but can't tolerate a CPAP machine.  Patient presented with dysuria and generalized weakness for the last 10 days.  Per patient had to up his oxygen  to 3L but denied chest pain ,cough, fever nausea, vomiting, diarrhea or abdominal pain.  Patient noticed worsening shortness of breath especially with movement.  Patient has chronic pain to ankles and history of gout. BNP 116.1. HS-Troponin was 16 and repeat 13.6. Chest x-ray: Emphysema. Ano acute cardiopulmonary abnormality.  ECHO/ LVEF: 55-60%-03/12/24. Grade I diastolic dysfunction (impaired relaxation). Right ventricular size is mildly enlarged. Challenging image quality.  Clinical Course:  Past Medical History:  Diagnosis Date   Chronic obstructive pulmonary disease (HCC) 2008   Moderate   Colon polyps    Community acquired pneumonia    Coronary artery disease    Degenerative joint disease    Diabetes mellitus without complication (HCC)    type 2   Emphysema lung (HCC)    Enlarged liver    fatty liver by '09 CT   Gastroesophageal reflux disease    Gout    H/O hiatal hernia    History of [place] spotted fever    Possible history of [place] Spotted Fever   Hyperlipidemia    Hypertension    Hypokalemia    diuretic induced, resolved   Microcytic anemia    iron pills   Morbid obesity (HCC)    Shortness of breath    Skin cancer    skin cancer lip removed   Sleep apnea    not currently using CPAP 05/20/13    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     Social History   Socioeconomic History   Marital status: Married    Spouse name: [name]   Number of children: 4   Years of education: GED, ITT   Highest education level: Associate degree: occupational, Scientist, product/process development, or vocational program  Occupational History   Occupation: architectural drawing-retired  Tobacco Use   Smoking status: Former    Current packs/day: 0.00    Average packs/day: 1 pack/day for 35.0 years (35.0 ttl pk-yrs)    Types: Cigarettes    Start date: 09/17/1971    Quit date: 09/16/2006    Years since quitting: 17.5   Smokeless tobacco: Never  Vaping Use   Vaping status: Never Used  Substance and Sexual Activity   Alcohol use: No    Alcohol/week: 0.0 standard drinks of alcohol   Drug use: No   Sexual activity: Not Currently  Other Topics Concern   Not on file  Social History Narrative   Lives with wife   Right Handed   Drinks 4-5 cups caffeine daily   Social Drivers of Health   Financial Resource Strain: Low Risk  (05/27/2023)   Overall Financial Resource Strain (CARDIA)    Difficulty of Paying Living Expenses: Not hard at all  Food Insecurity: No Food Insecurity (03/12/2024)   Hunger Vital Sign    Worried About Running Out of Food in the Last Year: Never true    Ran Out of  Food in the Last Year: Never true  Transportation Needs: No Transportation Needs (03/12/2024)   PRAPARE - Administrator, Civil Service (Medical): No    Lack of Transportation (Non-Medical): No  Physical Activity: Insufficiently Active (05/27/2023)   Exercise Vital Sign    Days of Exercise per Week: 1 day    Minutes of Exercise per Session: 10 min  Stress: No Stress Concern Present (05/27/2023)   [employer] of Occupational Health - Occupational Stress Questionnaire    Feeling of Stress : Not at all  Social Connections: Moderately Integrated (03/12/2024)   Social Connection and Isolation Panel     Frequency of Communication with Friends and Family: More than three times a week    Frequency of Social Gatherings with Friends and Family: Twice a week    Attends Religious Services: Never    Database administrator or Organizations: Yes    Attends Banker Meetings: Never    Marital Status: Married   Water engineer and Provision:  Detailed education and instructions provided on heart failure disease management including the following:  Signs and symptoms of Heart Failure When to call the physician Importance of daily weights Low sodium diet Fluid restriction Medication management Anticipated future follow-up appointments  Patient education given on each of the above topics.  Patient acknowledges understanding via teach back method and acceptance of all instructions.  Education Materials:  Living Better With Heart Failure Booklet, HF zone tool, & Daily Weight Tracker Tool.  Patient has scale at home: Yes Patient has pill box at home: Yes    High Risk Criteria for Readmission and/or Poor Patient Outcomes: Heart failure hospital admissions (last 6 months): 1  No Show rate: 8% Difficult social situation: None Demonstrates medication adherence: Yes Primary Language: English Literacy level: Reading, Writing & Comprehension  Barriers of Care:   Ambulation  Considerations/Referrals:   Referral made to Heart Failure Pharmacist Stewardship: Yes Referral made to Heart Failure CSW/NCM TOC: No Referral made to Heart & Vascular TOC clinic: Yes. 03/26/2024 @ 2:00 PM  Items for Follow-up on DC/TOC: Diet & Fluid Restrictions Daily Weights Continued Heart Failure Education  Charmaine Pines, RN, BSN [facility] Heart Failure Navigator Secure Chat Only

## 2024-03-16 NOTE — Consult Note (Signed)
 ORTHOPAEDIC CONSULTATION  REQUESTING PHYSICIAN: Tobie Calix, MD  Chief Complaint:   Right knee and bilateral ankle pain, right more symptomatic than left.  History of Present Illness: Alfred Carney is a 73 y.o. male with multiple medical problems including coronary artery disease, congestive heart failure, diabetes, COPD, hypertension, hyperlipidemia, gastroesophageal reflux disease, and gout who presented to the emergency room 5 days ago complaining of a 10-day history of progressive weakness.  Subsequently, the patient was admitted for management of new onset congestive heart failure.  The patient's symptoms have been improving but he has been having difficulty being mobilized by physical therapy due to right knee and bilateral ankle pain.  Therefore, orthopedic consultation has been requested.  The patient notes that he has had pain in both lower extremities, including both knees and ankles for a long time, but his symptoms have worsened over the past few weeks.  He has been told that he has both gout and arthritis, but he has been able to manage his pain with meloxicam  for the arthritis and allopurinol  for the gout.  Apparently, a few weeks ago, he lost his balance while getting off his Zero-turn lawnmower and fell backwards, striking his back against the instrument panel and gearshift.  Shortly after that, while walking in his kitchen, he felt his legs go weak, and fell to the ground.  He is approximately 5 years status post an L3-4 microdiscectomy from which he has done reasonably well.  However, he was offered a more extensive fusion procedure which he declined.  Past Medical History:  Diagnosis Date   Chronic obstructive pulmonary disease (HCC) 2008   Moderate   Colon polyps    Community acquired pneumonia    Coronary artery disease    Degenerative joint disease    Diabetes mellitus without complication (HCC)    type 2    Emphysema lung (HCC)    Enlarged liver    fatty liver by '09 CT   Gastroesophageal reflux disease    Gout    H/O hiatal hernia    History of [place] spotted fever    Possible history of [place] Spotted Fever   Hyperlipidemia    Hypertension    Hypokalemia    diuretic induced, resolved   Microcytic anemia    iron pills   Morbid obesity (HCC)    Shortness of breath    Skin cancer    skin cancer lip removed   Sleep apnea    not currently using CPAP 05/20/13   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 LEVEL  10/23/2018       ESOPHAGOGASTRODUODENOSCOPY (EGD) WITH PROPOFOL  N/A 01/22/2018   Procedure: ESOPHAGOGASTRODUODENOSCOPY (EGD) WITH PROPOFOL ;  Surgeon: Shila Gustav GAILS, MD;  Location: [facility];  Service: Endoscopy;  Laterality: N/A;   HARDWARE REMOVAL Left 05/26/2013   Procedure: HARDWARE REMOVAL;  Surgeon: Jerona GAILS Sage, MD;  Location: [facility] OR;  Service: Orthopedics;  Laterality: Left;  Left Total Hip Arthroplasty, Removal of Deep Hardware   HIP SURGERY     Status post left hip surgery with bone grafting  IR INJECT/THERA/INC NEEDLE/CATH/PLC EPI/LUMB/SAC W/IMG  10/23/2018   LEFT HEART CATHETERIZATION WITH CORONARY ANGIOGRAM N/A 01/31/2014   Procedure: LEFT HEART CATHETERIZATION WITH CORONARY ANGIOGRAM;  Surgeon: Lonni JONETTA Cash, MD;  Location: [facility];  Service: Cardiovascular;  Laterality: N/A;   LUMBAR LAMINECTOMY/DECOMPRESSION MICRODISCECTOMY Left 10/28/2018   Procedure: Left Lumbar three-four Extraforaminal microdiscectomy;  Surgeon: Alix Charleston, MD;   Location: [facility] OR;  Service: Neurosurgery;  Laterality: Left;   THORACOABDOMINAL AORTIC ANEURYSM REPAIR     with right femoral and left iliac BPG and reimplantation of renal arteries.   TONSILLECTOMY     TONSILLECTOMY     TOTAL HIP ARTHROPLASTY Left 05/26/2013   Procedure: TOTAL HIP ARTHROPLASTY;  Surgeon: Jerona LULLA Sage, MD;  Location: [facility] OR;  Service: Orthopedics;  Laterality: Left;  Left Total Hip Arthroplasty, Removal Deep Hardware   Social History   Socioeconomic History   Marital status: Married    Spouse name: [name]   Number of children: 4   Years of education: GED, ITT   Highest education level: Associate degree: occupational, Scientist, product/process development, or vocational program  Occupational History   Occupation: architectural drawing-retired  Tobacco Use   Smoking status: Former    Current packs/day: 0.00    Average packs/day: 1 pack/day for 35.0 years (35.0 ttl pk-yrs)    Types: Cigarettes    Start date: 09/17/1971    Quit date: 09/16/2006    Years since quitting: 17.5   Smokeless tobacco: Never  Vaping Use   Vaping status: Never Used  Substance and Sexual Activity   Alcohol use: No    Alcohol/week: 0.0 standard drinks of alcohol   Drug use: No   Sexual activity: Not Currently  Other Topics Concern   Not on file  Social History Narrative   Lives with wife   Right Handed   Drinks 4-5 cups caffeine daily   Social Drivers of Health   Financial Resource Strain: Low Risk  (05/27/2023)   Overall Financial Resource Strain (CARDIA)    Difficulty of Paying Living Expenses: Not hard at all  Food Insecurity: No Food Insecurity (03/12/2024)   Hunger Vital Sign    Worried About Running Out of Food in the Last Year: Never true    Ran Out of Food in the Last Year: Never true  Transportation Needs: No Transportation Needs (03/12/2024)   PRAPARE - Administrator, Civil Service (Medical): No    Lack of Transportation (Non-Medical): No  Physical Activity: Insufficiently Active (05/27/2023)    Exercise Vital Sign    Days of Exercise per Week: 1 day    Minutes of Exercise per Session: 10 min  Stress: No Stress Concern Present (05/27/2023)   [employer] of Occupational Health - Occupational Stress Questionnaire    Feeling of Stress : Not at all  Social Connections: Moderately Integrated (03/12/2024)   Social Connection and Isolation Panel    Frequency of Communication with Friends and Family: More than three times a week    Frequency of Social Gatherings with Friends and Family: Twice a week    Attends Religious Services: Never    Database administrator or Organizations: Yes    Attends Banker Meetings: Never    Marital Status: Married   Family History  Problem Relation Age of Onset   Osteoarthritis Mother    Diabetes Mother    Pulmonary embolism Mother    Heart disease Father        Coronary Artery Disease   Stroke  Father    Multiple sclerosis Sister    Factor V Leiden deficiency Sister    Emphysema Sister    Hyperlipidemia Brother    Heart attack Maternal Grandmother    Lung cancer Maternal Grandfather        smoked   No Known Allergies Prior to Admission medications   Medication Sig Start Date End Date Taking? Authorizing Provider  albuterol  (VENTOLIN  HFA) 108 (90 Base) MCG/ACT inhaler Inhale 2 puffs into the lungs every 4 (four) hours as needed for wheezing or shortness of breath. 11/05/22  Yes Darlean Ozell NOVAK, MD  allopurinol  (ZYLOPRIM ) 100 MG tablet Take 2 tablets (200 mg total) by mouth daily. 07/08/23 07/07/24 Yes Dugal, Ginger, FNP  Calcium  Carbonate-Simethicone  750-80 MG CHEW Chew 1 tablet by mouth as needed (gas).    Yes [provider]  Cholecalciferol  (VITAMIN D  PO) Take 1,000 Units by mouth daily.   Yes [provider]  glipiZIDE  (GLUCOTROL ) 5 MG tablet TAKE 1 TABLET BY MOUTH TWICE DAILY BEFORE A MEAL 12/01/23  Yes Dugal, Ginger, FNP  irbesartan  (AVAPRO ) 300 MG tablet Take 1 tablet by mouth once daily 12/26/23  Yes  Dugal, Tabitha, FNP  meloxicam  (MOBIC ) 7.5 MG tablet Take 1 tablet by mouth once daily 12/01/23  Yes Dugal, Tabitha, FNP  metoprolol  succinate (TOPROL -XL) 25 MG 24 hr tablet Take 1 tablet by mouth once daily 12/26/23  Yes Dugal, Tabitha, FNP  pravastatin  (PRAVACHOL ) 40 MG tablet Take 1 tablet (40 mg total) by mouth daily. 05/21/23  Yes Dugal, Tabitha, FNP  tamsulosin  (FLOMAX ) 0.4 MG CAPS capsule Take 1 capsule by mouth at bedtime 02/13/24  Yes Dugal, Tabitha, FNP  clobetasol  cream (TEMOVATE ) 0.05 % Apply 1 Application topically 2 (two) times daily. Patient not taking: Reported on 03/11/2024 05/28/23   Watt Mirza, MD  furosemide  (LASIX ) 20 MG tablet Take 2 tablets by mouth once daily 02/27/24   Dugal, Tabitha, FNP  glucose blood test strip Use as instructed 05/29/23   Dugal, Tabitha, FNP  ibuprofen  (ADVIL ) 200 MG tablet Take 200 mg by mouth every 6 (six) hours as needed. Patient not taking: Reported on 03/11/2024    [provider]  ketoconazole  (NIZORAL ) 2 % shampoo Apply topically to affected area once daily as needed for irritation Patient not taking: Reported on 03/11/2024 03/11/23   Corwin Ginger, FNP  Lancet Device MISC 1 Lancet by Does not apply route daily as needed. 05/29/23   Corwin Ginger, FNP  nitroGLYCERIN  (NITROSTAT ) 0.4 MG SL tablet Place 1 tablet (0.4 mg total) under the tongue every 5 (five) minutes x 3 doses as needed for chest pain. Patient not taking: Reported on 03/11/2024 01/04/19   Velma Raisin, MD  OXYGEN  Inhale 2 L into the lungs continuous.     [provider]  tacrolimus (PROTOPIC) 0.1 % ointment Apply topically 2 (two) times daily. 2 weeks    [provider]   DG Ankle Complete Left Result Date: 03/16/2024 CLINICAL DATA:  Ankle pain and weakness. EXAM: LEFT ANKLE COMPLETE - 3+ VIEW COMPARISON:  Left ankle radiographs 05/02/2018 FINDINGS: There is resection of the proximal aspect of the fibular diaphysis again extending off the superior plane of view.  Moderate degenerative spurring at the distal aspect of the medial and lateral malleoli. There are again chronic ossicles just distal to the fibula. The ankle mortise is symmetric and intact. Moderate anterior and mild posterior distal tibial plafond degenerative osteophytosis. Mild plantar and posterior calcaneal heel spurs. Moderate to high-grade dorsal tarsometatarsal degenerative  osteophytes on lateral view, unchanged. There is again mineralization along the proximal aspect of the plantar fascia at the level of the anterior process of the calcaneus. No acute fracture or dislocation. IMPRESSION: 1. Remote resection of the proximal aspect of the fibular diaphysis. 2. Moderate medial and lateral ankle degenerative osteophytes. 3. Moderate to high-grade dorsal tarsometatarsal degenerative osteophytosis. 4. Mild plantar and posterior calcaneal heel spurs. Electronically Signed   By: Tanda Lyons M.D.   On: 03/16/2024 15:30   DG Ankle Complete Right Result Date: 03/16/2024 CLINICAL DATA:  Right ankle pain and weakness. EXAM: RIGHT ANKLE - COMPLETE 3+ VIEW COMPARISON:  None Available. FINDINGS: Densities in the patient's socks somewhat limit evaluation of fine bony detail. There are multiple chronic ossicles distal to the medial malleolus, the sequela of remote trauma. Mild plantar and posterior calcaneal heel spurs. Mild distal anterior tibial plafond greater than posterior tibial plafond degenerative osteophytosis. The ankle mortise is symmetric and intact. No acute fracture or dislocation. IMPRESSION: 1. Mild plantar and posterior calcaneal heel spurs. 2. Mild distal anterior tibial plafond greater than posterior tibial plafond degenerative osteophytosis. 3. Multiple chronic ossicles distal to the medial malleolus, the sequela of remote trauma. Electronically Signed   By: Tanda Lyons M.D.   On: 03/16/2024 15:28   DG Knee Complete 4 Views Right Result Date: 03/15/2024 CLINICAL DATA:  Pain after fall EXAM:  RIGHT KNEE - COMPLETE 4+ VIEW COMPARISON:  None Available. FINDINGS: No definitive fracture or malalignment. Chondrocalcinosis. Positive for knee effusion. Relatively patent joint spaces IMPRESSION: 1. No definitive acute osseous abnormality. Knee effusion. Cross-sectional imaging follow-up may be performed if high suspicion for acute osseous injury. Electronically Signed   By: Luke Bun M.D.   On: 03/15/2024 16:12   Positive ROS: All other systems have been reviewed and were otherwise negative with the exception of those mentioned in the HPI and as above.  Physical Exam: General:  Alert, no acute distress Psychiatric:  Patient is competent for consent with normal mood and affect   Cardiovascular:  No pedal edema Respiratory:  No wheezing, non-labored breathing GI:  Abdomen is soft and non-tender Skin:  No lesions in the area of chief complaint Neurologic:  Sensation intact distally Lymphatic:  No axillary or cervical lymphadenopathy  Orthopedic Exam:  Orthopedic examination is limited to both lower extremities and feet.  Regarding the right knee, he is able to actively extend his knee, perform a straight leg raise, and flex his knee to 75 degrees, all with only minimal discomfort in the knee.  He does experience mild-moderate tenderness to palpation on the lateral joint line, but only minimal tenderness medially.  Skin inspection is notable for mild swelling around the knee, but otherwise is unremarkable.  No erythema, ecchymosis, abrasions, or other skin abnormalities are identified.  There is a small effusion.  Regarding his right ankle, skin inspection is notable for mild swelling, but otherwise is unremarkable.  No erythema, ecchymosis, abrasions, or other skin abnormalities are noted around the ankle.  However, he does have an enlarged area of patchy skin consistent with eczema over the anterior aspect of the lower leg.  He has moderate tenderness to palpation over the medial aspect of the  ankle, especially along the posterior tibialis tendon.  There is no tenderness to palpation anteriorly, laterally, or posteriorly around the ankle.  He is able to actively dorsiflex and plantar flex his ankle with only mild pain.  He exhibits 4/5 strength with resisted eversion and plantarflexion, 3+-4/5 strength  with resisted ankle dorsiflexion and EHL testing, and 3+/5 strength with resisted inversion.  He is grossly neurovascularly intact to the right foot and lower leg, demonstrating intact sensation to light touch in all distributions.  He does have an equivocally positive straight leg raise at 25 degrees on the right in that it does recreate back pain and posterior right thigh pain.  On examination of his left lower extremity.  Skin inspection around the knee is unremarkable.  No swelling, erythema, ecchymosis, abrasions, or other skin abnormalities are identified.  He has no tenderness to palpation around the knee.  He is able to actively flex his knee from 0 to 100 degrees with minimal difficulty, and is able to perform an active straight leg raise.  Skin inspection around the left ankle is notable for mild swelling, but otherwise is unremarkable.  Again, no erythema, ecchymosis, abrasions, or other skin abnormalities are identified.  He has at most minimal tenderness palpation of the medial aspect of the ankle.  There are no other areas of area of tenderness around the ankle or foot.  He is able dorsiflex and plantarflex his left foot and lower leg without difficulty.  He is grossly neurovascularly intact to the left lower extremity and foot.  He has a negative supine straight leg raise to 45 degrees on the left.  X-rays:  Recent x-rays of the right knee are available for review and have been reviewed by myself.  The findings are as described above.  These films demonstrate evidence of an effusion with underlying chondrocalcinosis.  No significant degenerative changes are identified.  No lytic lesions  or acute bony abnormalities are noted.  Recent AP, lateral, and oblique views of the right ankle are available for review and have been reviewed by myself.  The findings are as described above.  These films demonstrate early degenerative changes of the tibiotalar joint as manifest by small osteophytes, but the joint space remains satisfactory and the mortise remains anatomic.  No lytic lesions or fractures are identified.  Recent AP, lateral, and oblique views of the left ankle are available for review and have been reviewed by myself.  The findings are as described above.  These films demonstrate early degenerative changes of the tibiotalar joint as manifest by small osteophytes, but the joint space remains satisfactory and the mortise remains anatomic.  No lytic lesions or fractures are identified.  Postsurgical changes suggestive of a prior resection of the fibular diaphysis are noted, possibly for a bone grafting procedure.  Assessment: Right greater than left lower extremity pain of unclear etiology and patient with history of gout and degenerative joint disease affecting his right knee and both ankles and feet.  Plan: The treatment options have been discussed with the patient.  Despite the patient's history of gout and degenerative joint disease, it is unclear as to whether the patient's present symptoms would be explained entirely by these diagnoses.  The patient is able to flex and extend his knee and both ankles and does not have significant effusions in any of these joints to suggest a flareup of gout or a flareup of his underlying arthritis.    The patient expresses concerns that the loss of balance getting off his mower and striking his back 2 weeks ago is contributing to his present symptoms.  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  Therefore, it is possible that much of his right lower extremity symptoms are actually coming from a flare-up of the L4-5 issue.  As a precaution, I will order lumbar radiographs and an MRI scan of his lumbar spine to assess for any pathology that might be contributing to his present symptoms.  Meanwhile, the patient may be mobilized with physical therapy as symptoms permit.  He may receive medication for pain as deemed appropriate from a medical standpoint.  Thank you for asking participate in the care of this most pleasant unfortunate man.  I will be happy to follow him with you.   DOROTHA Reyes Maltos, MD  Beeper #:  [phone]  03/16/2024 10:46 AM

## 2024-03-16 NOTE — TOC Progression Note (Addendum)
 Transition of Care [facility]) - Progression Note    Patient Details  Name: Alfred Carney MRN: [id number] Date of Birth: 29-May-1951  [facility] CM/SW Contact  Tomasa JAYSON Childes, RN Phone Number: 03/16/2024, 9:57 AM  Clinical Narrative:    Spoke with patient's wife to advised no bed offer from [facility] has been secured. She would like a search for [facility] facilities but would prefer [facility].   Bed search extended to [facility] facilities.   4:18pm Spoke with patient's wife to give bed offer for [facility] and [facility]. Patient wife is agreeable to [facility].      Expected Discharge Plan: Skilled Nursing Facility    Expected Discharge Plan and Services                           DME Agency: [employer]                   Social Determinants of Health (SDOH) Interventions SDOH Screenings   Food Insecurity: No Food Insecurity (03/12/2024)  Housing: Low Risk  (03/12/2024)  Transportation Needs: No Transportation Needs (03/12/2024)  Utilities: Not At Risk (03/12/2024)  Alcohol Screen: Low Risk  (05/09/2023)  Depression (PHQ2-9): Medium Risk (11/18/2023)  Financial Resource Strain: Low Risk  (05/27/2023)  Physical Activity: Insufficiently Active (05/27/2023)  Social Connections: Moderately Integrated (03/12/2024)  Stress: No Stress Concern Present (05/27/2023)  Tobacco Use: Medium Risk (03/11/2024)  Health Literacy: Adequate Health Literacy (05/12/2023)    Readmission Risk Interventions     No data to display

## 2024-03-17 ENCOUNTER — Inpatient Hospital Stay

## 2024-03-17 DIAGNOSIS — N1831 Chronic kidney disease, stage 3a: Secondary | ICD-10-CM | POA: Diagnosis not present

## 2024-03-17 DIAGNOSIS — M25561 Pain in right knee: Secondary | ICD-10-CM | POA: Diagnosis not present

## 2024-03-17 DIAGNOSIS — I509 Heart failure, unspecified: Secondary | ICD-10-CM | POA: Diagnosis not present

## 2024-03-17 DIAGNOSIS — I872 Venous insufficiency (chronic) (peripheral): Secondary | ICD-10-CM | POA: Diagnosis not present

## 2024-03-17 LAB — GLUCOSE, CAPILLARY
Glucose-Capillary: 186 mg/dL — ABNORMAL HIGH (ref 70–99)
Glucose-Capillary: 161 mg/dL — ABNORMAL HIGH (ref 70–99)
Glucose-Capillary: 155 mg/dL — ABNORMAL HIGH (ref 70–99)
Glucose-Capillary: 219 mg/dL — ABNORMAL HIGH (ref 70–99)
Glucose-Capillary: 230 mg/dL — ABNORMAL HIGH (ref 70–99)

## 2024-03-17 MED ORDER — DEXAMETHASONE 4 MG PO TABS
2.0000 mg | ORAL_TABLET | Freq: Four times a day (QID) | ORAL | Status: AC
  Administered 2024-03-19 – 2024-03-20 (×4): 2 mg via ORAL
  Filled 2024-03-17: qty 1
  Filled 2024-03-17: qty 4
  Filled 2024-03-17: qty 1
  Filled 2024-03-17: qty 4
  Filled 2024-03-17: qty 1

## 2024-03-17 MED ORDER — PREGABALIN 50 MG PO CAPS
50.0000 mg | ORAL_CAPSULE | Freq: Two times a day (BID) | ORAL | Status: DC
  Administered 2024-03-17 – 2024-03-23 (×12): 50 mg via ORAL
  Filled 2024-03-17 (×12): qty 1

## 2024-03-17 MED ORDER — DEXAMETHASONE 0.5 MG PO TABS
1.0000 mg | ORAL_TABLET | Freq: Four times a day (QID) | ORAL | Status: AC
  Administered 2024-03-20 – 2024-03-21 (×4): 1 mg via ORAL
  Filled 2024-03-17 (×4): qty 2

## 2024-03-17 MED ORDER — DEXAMETHASONE 0.5 MG PO TABS
3.0000 mg | ORAL_TABLET | Freq: Four times a day (QID) | ORAL | Status: AC
  Administered 2024-03-18 – 2024-03-19 (×4): 3 mg via ORAL
  Filled 2024-03-17 (×5): qty 6

## 2024-03-17 MED ORDER — BISACODYL 5 MG PO TBEC
5.0000 mg | DELAYED_RELEASE_TABLET | Freq: Every day | ORAL | Status: DC | PRN
  Administered 2024-03-22: 5 mg via ORAL
  Filled 2024-03-17: qty 1

## 2024-03-17 MED ORDER — LIDOCAINE 5 % EX PTCH
1.0000 | MEDICATED_PATCH | CUTANEOUS | Status: DC
  Administered 2024-03-17 – 2024-03-22 (×5): 1 via TRANSDERMAL
  Filled 2024-03-17 (×7): qty 1

## 2024-03-17 MED ORDER — ENOXAPARIN SODIUM 60 MG/0.6ML IJ SOSY
60.0000 mg | PREFILLED_SYRINGE | INTRAMUSCULAR | Status: DC
  Administered 2024-03-17: 60 mg via SUBCUTANEOUS
  Filled 2024-03-17: qty 0.6

## 2024-03-17 MED ORDER — DEXAMETHASONE 4 MG PO TABS
4.0000 mg | ORAL_TABLET | Freq: Four times a day (QID) | ORAL | Status: AC
  Administered 2024-03-17 – 2024-03-18 (×4): 4 mg via ORAL
  Filled 2024-03-17 (×4): qty 1

## 2024-03-17 NOTE — Progress Notes (Signed)
 Occupational Therapy Treatment Patient Details Name: Alfred Carney MRN: [id number] DOB: 1951/09/09 Today's Date: 03/17/2024   History of present illness 73 year old male with history of non-insulin -dependent diabetes mellitus, PAD, hyperlipidemia, hypertension, history of gout, BPH, COPD on chronic 2 L nasal cannula at baseline, history of heart failure preserved ejection fraction, who presents emergency department for chief concerns of weakness for 10 days.   OT comments  Pt is supine in bed on arrival. Pleasant and agreeable to PT/OT co-tx session to maximize pt/therapist safety. He continues to have mild pain in his RLE. Found on 6L HFNC at 98% and able to wean to 5L with slight drop to 88% with activity and improvement to 94% at rest. Pt performed supine to sit at EOB with CGA, increased time and effort as OT entering the room. Pt required SBA for seated balance at EOB. Session focused on building strength and activity/standing tolerance. He required Mod A x1-2 for STS from EOB to STEDY, more assist as he fatigues. He does endorse dizziness this date, therefore BP monitored and noted to be high-142/123 using large cuff on R arm at bicep. Nurse and MD notified. Pt returned to supine with Mod A for BLE management and is able to bridge with Mod A and cueing to replace soiled chux pads. Bed placed in trendelenburg and pt utilizing headboard to scoot himself up in bed with supervision. Pt returned to bed with all needs in place and will cont to require skilled acute OT services to maximize his safety and IND to return to PLOF.       If plan is discharge home, recommend the following:  Two people to help with walking and/or transfers;A lot of help with bathing/dressing/bathroom;Help with stairs or ramp for entrance;Assist for transportation;Assistance with cooking/housework   Equipment Recommendations  Other (comment) (defer to next venue)    Recommendations for Other Services      Precautions /  Restrictions Precautions Precautions: Fall Recall of Precautions/Restrictions: Intact Restrictions Weight Bearing Restrictions Per Provider Order: No       Mobility Bed Mobility Overal bed mobility: Needs Assistance Bed Mobility: Supine to Sit, Sit to Supine     Supine to sit: Contact guard, Used rails, HOB elevated Sit to supine: Mod assist, +2 for safety/equipment   General bed mobility comments: able to reach EOB with CGA, needs Mod A for BLE management to return safely to supine position    Transfers Overall transfer level: Needs assistance Equipment used: Rolling walker (2 wheels) Transfers: Sit to/from Stand Sit to Stand: Mod assist, +2 physical assistance           General transfer comment: pt demo 4 STS trials from EOB to STEDY via pulling on stedy with Mod A x1-2 (more assist as he fatigued); pt reports dizziness with BP monitored and noted to be high-nurse and MD made aware Transfer via Lift Equipment: Stedy   Balance Overall balance assessment: Needs assistance Sitting-balance support: Feet supported, Bilateral upper extremity supported Sitting balance-Leahy Scale: Fair Sitting balance - Comments: no LOB while seated EOB, CGA d/t dizziness   Standing balance support: Reliant on assistive device for balance, Bilateral upper extremity supported Standing balance-Leahy Scale: Poor Standing balance comment: use of stedy and Min A x2 to maintain balance                           ADL either performed or assessed with clinical judgement   ADL Overall ADL's : Needs  assistance/impaired                                       General ADL Comments: session focued on standing trials, chux pads changed d/t purewick malfunction    Extremity/Trunk Assessment              Vision       Perception     Praxis     Communication Communication Communication: Impaired Factors Affecting Communication: Hearing impaired   Cognition  Arousal: Alert Behavior During Therapy: WFL for tasks assessed/performed                                 Following commands: Intact        Cueing   Cueing Techniques: Verbal cues, Tactile cues  Exercises      Shoulder Instructions       General Comments weaned to 5L HFNC and stable, HR Stable and BPs elevated with activity with c/o dizziness 121/107, 147/134 and 142/123    Pertinent Vitals/ Pain       Pain Assessment Pain Assessment: Faces Faces Pain Scale: Hurts little more Pain Location: R ankle and knee Pain Descriptors / Indicators: Aching, Discomfort Pain Intervention(s): Limited activity within patient's tolerance, Monitored during session, Repositioned  Home Living                                          Prior Functioning/Environment              Frequency  Min 2X/week        Progress Toward Goals  OT Goals(current goals can now be found in the care plan section)  Progress towards OT goals: Progressing toward goals  Acute Rehab OT Goals Patient Stated Goal: get stronger OT Goal Formulation: With patient Time For Goal Achievement: 03/27/24 Potential to Achieve Goals: Fair  Plan      Co-evaluation                 AM-PAC OT 6 Clicks Daily Activity     Outcome Measure   Help from another person eating meals?: None Help from another person taking care of personal grooming?: A Little Help from another person toileting, which includes using toliet, bedpan, or urinal?: Total Help from another person bathing (including washing, rinsing, drying)?: A Lot Help from another person to put on and taking off regular upper body clothing?: A Little Help from another person to put on and taking off regular lower body clothing?: A Lot 6 Click Score: 15    End of Session Equipment Utilized During Treatment: Rolling walker (2 wheels);Oxygen   OT Visit Diagnosis: Unsteadiness on feet (R26.81);Repeated falls  (R29.6);History of falling (Z91.81)   Activity Tolerance Patient tolerated treatment well   Patient Left in bed;with call bell/phone within reach;with bed alarm set   Nurse Communication Mobility status        Time: 8874-8848 OT Time Calculation (min): 26 min  Charges: OT General Charges $OT Visit: 1 Visit OT Treatments $Therapeutic Activity: 8-22 mins  Angeliyah Kirkey, OTR/L  03/17/24, 1:38 PM   Kenith Trickel E Sarie Stall 03/17/2024, 1:34 PM

## 2024-03-17 NOTE — Progress Notes (Addendum)
 [facility]  - [place] [facility]   PATIENT NAME: Alfred Carney    MR#:  [id number]  DATE OF BIRTH:  04/28/1951  SUBJECTIVE:  no family at bedside. Heart rate much improved.  Patient use CPAP last night and was able to tolerated very well. Continues with generalized aches and pains bilateral ankle pain and back pain.  VITALS:  Blood pressure 124/60, pulse 75, temperature 97.8 F (36.6 C), resp. rate 18, height 5' 9.5 (1.765 m), weight 128.6 kg, SpO2 100%.  PHYSICAL EXAMINATION:   GENERAL:  73 y.o.-year-old patient with no acute distress. Morbidly obese LUNGS: distant breath sounds bilaterally, no wheezing CARDIOVASCULAR: S1, S2 normal. No murmur, ABDOMEN: Soft, nontender Abdominal obesity EXTREMITIES: chronic edema b/l.   Right knee painful ROM NEUROLOGIC: nonfocal  patient is alert and awake.   LABORATORY PANEL:  CBC Recent Labs  Lab 03/12/24 0536  WBC 16.4*  HGB 12.8*  HCT 39.9  PLT 356    Chemistries  Recent Labs  Lab 03/11/24 0818 03/12/24 0536 03/16/24 0328  NA 138   < > 141  K 3.1*   < > 4.5  CL 94*   < > 96*  CO2 30   < > 34*  GLUCOSE 121*   < > 191*  BUN 19   < > 30*  CREATININE 1.43*   < > 1.02  CALCIUM  8.9   < > 8.9  MG 2.1  --   --   AST 37  --   --   ALT 30  --   --   ALKPHOS 89  --   --   BILITOT 1.1  --   --    < > = values in this interval not displayed.   Cardiac Enzymes No results for input(s): TROPONINI in the last 168 hours. RADIOLOGY:  DG Chest Port 1 View Result Date: 03/17/2024 CLINICAL DATA:  Shortness of breath EXAM: PORTABLE CHEST 1 VIEW COMPARISON:  Chest x-ray 03/11/2024. FINDINGS: Normal cardiopericardial silhouette with calcified aorta. Persistent small left pleural effusion. No pneumothorax, right effusion or edema. No consolidation. Films are under penetrated. Degenerative changes of the spine. IMPRESSION: Stable left-sided small pleural effusion.  Chronic lung changes. Electronically Signed   By: Ranell Bring M.D.   On: 03/17/2024 13:41   MR LUMBAR SPINE WO CONTRAST Result Date: 03/17/2024 CLINICAL DATA:  73 year old male with low back pain.  Prior surgery. EXAM: MRI LUMBAR SPINE WITHOUT CONTRAST TECHNIQUE: Multiplanar, multisequence MR imaging of the lumbar spine was performed. No intravenous contrast was administered. COMPARISON:  Lumbar radiographs yesterday.  Lumbar MRI 05/12/2019. FINDINGS: Segmentation: Normal on the comparison radiographs, the same numbering system used on the previous MRI. Alignment: Chronic mildly exaggerated lumbar lordosis is stable since 2020. No significant scoliosis or spondylolisthesis. Vertebrae: Normal background bone marrow signal. Bulky chronic widespread degenerative vertebral endplate spurring in the lower thoracic and the lumbar spine. No marrow edema or evidence of acute osseous abnormality. Intact visible sacrum and SI joints. Conus medullaris and cauda equina: Conus extends to the L1 level. No lower spinal cord or conus signal abnormality. Paraspinal and other soft tissues: Infrarenal abdominal aortic aneurysm, diameter estimated at 3.9 cm on series 8, image 17. Unclear whether this has been previously repaired, but this does appear grossly stable from CTA abdomen 01/28/2018. Negative other visible abdominal viscera. Negative visualized posterior paraspinal soft tissues. Disc levels: T12-L1: Anterior disc osteophyte complex. Mild facet hypertrophy. No stenosis. L1-L2: Disc space loss with more circumferential disc  osteophyte complex. No stenosis. L2-L3: Circumferential disc osteophyte complex. Asymmetric left subarticular component with superimposed mild to moderate facet, mild ligament flavum hypertrophy. Moderate spinal stenosis. Moderate to severe left lateral recess stenosis (descending left L3 nerve level), moderate left L2 foraminal stenosis. L3-L4: Disc space loss with bulky circumferential disc osteophyte complex asymmetric to the right. Up to moderate facet and  ligament flavum hypertrophy. Moderate to severe spinal stenosis (series 8, image 20 and series 5, image 8). Severe left and moderate right L3 foraminal stenosis. L4-L5: Disc space loss with bulky circumferential disc osteophyte complex asymmetric to the right. Moderate facet and ligament flavum hypertrophy. Moderate spinal stenosis, moderate to severe lateral recess stenosis greater on the right (L5 nerve levels). Mild left and severe right L4 neural foraminal stenosis (series 5, image 4 on the right). L5-S1: Posterior disc space loss. Bulky circumferential disc osteophyte complex. Mild to moderate facet and ligament flavum hypertrophy. No spinal stenosis, but moderate lateral recess stenosis on the left (left S1 nerve level). Severe left and mild right L5 foraminal stenosis (series 5, image 13 on the left). IMPRESSION: 1. Infrarenal Abdominal Aortic Aneurysm, diameter estimated at 3.9 cm, grossly stable from CTA Abdomen 01/28/2018. Recommend follow-up ultrasound every 3 years. (Ref.: J Vasc Surg. 2018; 67:2-77 and J Am Coll Radiol 2013;10(10):789-794.) 2. Widespread chronic lumbar spine bulky disc, endplate, and posterior element degeneration. No acute osseous abnormality. 3. 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 Moderate to severe lateral recess stenosis at the left L3, bilateral L4, right greater than left L5, and left S1 nerve levels. Severe neural foraminal stenosis at the left L3, right L4, and left L5 nerve levels. Electronically Signed   By: VEAR Hurst M.D.   On: 03/17/2024 12:54   DG Lumbar Spine 2-3 Views Result Date: 03/16/2024 CLINICAL DATA:  [id number] Other spondylosis with radiculopathy, lumbar region [id number] EXAM: LUMBAR SPINE - 2-3 VIEW COMPARISON:  Lumbar radiograph 04/28/2019 FINDINGS: Five non-rib-bearing lumbar vertebra. Minimal broad-based levo scoliotic curvature. No fracture compression deformity. Diffuse degenerative disc disease with disc space  narrowing and spurring throughout, most prominently affecting L3-L4 and L4-L5. Moderate multilevel facet hypertrophy. IMPRESSION: 1. Diffuse degenerative disc disease and facet hypertrophy. 2. Minimal broad-based levo scoliotic curvature. Electronically Signed   By: Andrea Gasman M.D.   On: 03/16/2024 20:06   DG Ankle Complete Left Result Date: 03/16/2024 CLINICAL DATA:  Ankle pain and weakness. EXAM: LEFT ANKLE COMPLETE - 3+ VIEW COMPARISON:  Left ankle radiographs 05/02/2018 FINDINGS: There is resection of the proximal aspect of the fibular diaphysis again extending off the superior plane of view. Moderate degenerative spurring at the distal aspect of the medial and lateral malleoli. There are again chronic ossicles just distal to the fibula. The ankle mortise is symmetric and intact. Moderate anterior and mild posterior distal tibial plafond degenerative osteophytosis. Mild plantar and posterior calcaneal heel spurs. Moderate to high-grade dorsal tarsometatarsal degenerative osteophytes on lateral view, unchanged. There is again mineralization along the proximal aspect of the plantar fascia at the level of the anterior process of the calcaneus. No acute fracture or dislocation. IMPRESSION: 1. Remote resection of the proximal aspect of the fibular diaphysis. 2. Moderate medial and lateral ankle degenerative osteophytes. 3. Moderate to high-grade dorsal tarsometatarsal degenerative osteophytosis. 4. Mild plantar and posterior calcaneal heel spurs. Electronically Signed   By: Tanda Lyons M.D.   On: 03/16/2024 15:30   DG Ankle Complete Right Result Date: 03/16/2024 CLINICAL DATA:  Right ankle pain  and weakness. EXAM: RIGHT ANKLE - COMPLETE 3+ VIEW COMPARISON:  None Available. FINDINGS: Densities in the patient's socks somewhat limit evaluation of fine bony detail. There are multiple chronic ossicles distal to the medial malleolus, the sequela of remote trauma. Mild plantar and posterior calcaneal heel spurs.  Mild distal anterior tibial plafond greater than posterior tibial plafond degenerative osteophytosis. The ankle mortise is symmetric and intact. No acute fracture or dislocation. IMPRESSION: 1. Mild plantar and posterior calcaneal heel spurs. 2. Mild distal anterior tibial plafond greater than posterior tibial plafond degenerative osteophytosis. 3. Multiple chronic ossicles distal to the medial malleolus, the sequela of remote trauma. Electronically Signed   By: Tanda Lyons M.D.   On: 03/16/2024 15:28     Assessment and Plan Decarlos Empey is a 73 year old male with history of non-insulin -dependent diabetes mellitus, PAD, hyperlipidemia, hypertension, history of gout, BPH, COPD on chronic 2 L nasal cannula at baseline, history of heart failure preserved ejection fraction, who presents emergency department for chief concerns of weakness for 10 days.   Paroxysmal atrial tachycardia acute diastolic congestive heart failure -- initial EKG showed atrial fibrillation however rhythm strip evaluated by cardiology shows PACs with sinus tachycardia -- will continue Cardizem 30 mg Q8 and PRN IV metoprolol -- change to Cardizem CD -- echo EF55-60%     -- [facility] cardiology input noted. Appears secondary to untreated sleep apnea/obesity hypoventilation syndrome -- IV Lasix -- with good urine output-- Lasix  discontinued. -- Patient started using CPAP at nighttime. Tolerating it well. Advised to consider buying similar kind of mask for CPAP at home. This was discussed with patient's daughter at length -- chest x-ray shows chronic lung disease changes. Small pleural effusion  Leukocytosis possible UTI -- patient not able to give any symptoms. However has abnormal UA and with weakness will treat for three doses of Rocephin . -- Urine culture negative  Chronic respiratory failure with chronic emphysema and untreated sleep apnea -- patient wears 2 L chronically oxygen  -- PRN nebulizer -- recommend reach out to  pulmonary and see if different mask or nasal tubing can be used for CPAP.  Generalized weakness deconditioning/recent fall --  PT/OT--rehab. Pt very deconditioned  Type II diabetes with hyperglycemia -- will resume glipizide  -- continue sliding scale  Hyperlipidemia -- statins  Bilateral ankle, bilateral knee pain and back pain lumbar spine DJD with stenosis -- patient tells me he has history of gout. Already on allopurinol  -- PRN tramadol, added oxycodone  --  uric acid 5.3 -- x-ray right knee--knee effusion.  --Ortho consult with Dr Edie-- PRN pain meds and physical therapy. Will await MRI lumbar spine review and any other recommendations -- addendum discussed with orthopedic recommends neurosurgery opinion regarding significant lumbar spinal stenosis which likely is contributing to his bilateral lower extremity pain along with DJD of the ankle joint. -- Neurosurgery consultation with Dr. Claudene-- he recommends Decadron  taper, Dr. Claudene will discuss with IR regarding injection for pain. I have held aspirin  and Lovenox  to final word from IR is obtained  Procedures: Family communication : none today  consults : [facility] cardiology, orthopedics, neurosurgery CODE STATUS: full DVT Prophylaxis : Lovenox  Level of care: Telemetry Cardiac Status is: Inpatient Remains inpatient appropriate because: overall improving. Awaiting for rehab bed offer.    TOTAL TIME TAKING CARE OF THIS PATIENT: 35 minutes.  >50% time spent on counselling and coordination of care  Note: This dictation was prepared with Dragon dictation along with smaller phrase technology. Any transcriptional errors that result from this  process are unintentional.  Leita Blanch M.D    [facility]   CC: Primary care physician; Corwin Antu, FNP

## 2024-03-17 NOTE — Progress Notes (Addendum)
 Physical Therapy Treatment Patient Details Name: Alfred Carney MRN: [id number] DOB: 11/19/1950 Today's Date: 03/17/2024   History of Present Illness 73 year old male with history of non-insulin -dependent diabetes mellitus, PAD, hyperlipidemia, hypertension, history of gout, BPH, COPD on chronic 2 L nasal cannula at baseline, history of heart failure preserved ejection fraction, who presents emergency department for chief concerns of weakness for 10 days.    PT Comments  PT/OT co-treatment performed this date. Pt cleared to work with therapy despite pending lumbar imaging. Pt able to achieve 4 sit<>Stands via stedy lift this date, still requires +2 assist and very effortful with extended rest break needed between sets. O2 weaned to 5L with pt able to maintain. Complains of dizziness throughout, elevated BP (142/123), relayed to MD via secure chat. Pre syncopal event noted in standing, returned back to supine and RN informed. Will continue to progress as able.   If plan is discharge home, recommend the following: A lot of help with walking and/or transfers;A lot of help with bathing/dressing/bathroom;Assistance with cooking/housework;Assist for transportation;Help with stairs or ramp for entrance   Can travel by private vehicle     No  Equipment Recommendations  None recommended by PT    Recommendations for Other Services       Precautions / Restrictions Precautions Precautions: Fall Recall of Precautions/Restrictions: Intact Restrictions Weight Bearing Restrictions Per Provider Order: No     Mobility  Bed Mobility Overal bed mobility: Needs Assistance Bed Mobility: Supine to Sit, Sit to Supine     Supine to sit: Contact guard Sit to supine: Mod assist, +2 for physical assistance   General bed mobility comments: needs assist for B LE and bed functions to scoot up towards HOB. Alll mobility very effortful    Transfers Overall transfer level: Needs assistance Equipment used:  Ambulation equipment used Transfers: Sit to/from Stand Sit to Stand: Mod assist, +2 physical assistance           General transfer comment: pt demo 4 STS trials from EOB to STEDY via pulling on stedy with Mod A x1-2 (more assist as he fatigued); pt reports dizziness with BP monitored and noted to be high-nurse and MD made aware Transfer via Lift Equipment: Stedy  Ambulation/Gait               General Gait Details: deferred due to fatigue   Stairs             Wheelchair Mobility     Tilt Bed    Modified Rankin (Stroke Patients Only)       Balance Overall balance assessment: Needs assistance Sitting-balance support: Feet supported, Bilateral upper extremity supported Sitting balance-Leahy Scale: Fair     Standing balance support: Reliant on assistive device for balance, Bilateral upper extremity supported Standing balance-Leahy Scale: Fair                              Hotel manager: Impaired Factors Affecting Communication: Hearing impaired  Cognition Arousal: Alert Behavior During Therapy: WFL for tasks assessed/performed   PT - Cognitive impairments: No apparent impairments                         Following commands: Intact      Cueing Cueing Techniques: Verbal cues, Tactile cues  Exercises Other Exercises Other Exercises: supine ther-ex performed on B LE including heel slides and SLR x 5 reps. Supervision given  General Comments General comments (skin integrity, edema, etc.): transitioned to 5L of O2 with good sats throughout session. HR controlled      Pertinent Vitals/Pain Pain Assessment Pain Assessment: Faces Faces Pain Scale: Hurts little more Pain Location: R ankle and knee Pain Descriptors / Indicators: Aching, Discomfort Pain Intervention(s): Limited activity within patient's tolerance, Repositioned, Premedicated before session    Home Living                           Prior Function            PT Goals (current goals can now be found in the care plan section) Acute Rehab PT Goals Patient Stated Goal: to decrease pain and feel better PT Goal Formulation: With patient Time For Goal Achievement: 03/28/24 Potential to Achieve Goals: Good Progress towards PT goals: Progressing toward goals    Frequency    Min 2X/week      PT Plan      Co-evaluation PT/OT/SLP Co-Evaluation/Treatment: Yes Reason for Co-Treatment: Complexity of the patient's impairments (multi-system involvement);For patient/therapist safety;To address functional/ADL transfers PT goals addressed during session: Mobility/safety with mobility OT goals addressed during session: ADL's and self-care      AM-PAC PT 6 Clicks Mobility   Outcome Measure  Help needed turning from your back to your side while in a flat bed without using bedrails?: A Little Help needed moving from lying on your back to sitting on the side of a flat bed without using bedrails?: A Little Help needed moving to and from a bed to a chair (including a wheelchair)?: A Lot Help needed standing up from a chair using your arms (e.g., wheelchair or bedside chair)?: A Lot Help needed to walk in hospital room?: A Lot Help needed climbing 3-5 steps with a railing? : A Lot 6 Click Score: 14    End of Session Equipment Utilized During Treatment: Oxygen  Activity Tolerance: Patient tolerated treatment well Patient left: in bed;with call bell/phone within reach;with bed alarm set Nurse Communication: Mobility status PT Visit Diagnosis: Unsteadiness on feet (R26.81);Other abnormalities of gait and mobility (R26.89);Muscle weakness (generalized) (M62.81)     Time: 8881-8848 PT Time Calculation (min) (ACUTE ONLY): 33 min  Charges:    $Therapeutic Activity: 8-22 mins PT General Charges $$ ACUTE PT VISIT: 1 Visit                     Alfred Carney, PT, DPT, GCS [phone]    Alfred Carney 03/17/2024, 3:45  PM

## 2024-03-17 NOTE — Progress Notes (Signed)
 PHARMACIST - PHYSICIAN COMMUNICATION  CONCERNING:  Enoxaparin  (Lovenox ) for DVT Prophylaxis    RECOMMENDATION: Patient was prescribed enoxaprin 40mg  q24 hours for VTE prophylaxis.   Filed Weights   03/15/24 0500 03/16/24 0500 03/17/24 0515  Weight: 129.8 kg (286 lb 2.5 oz) 128.9 kg (284 lb 2.8 oz) 128.6 kg (283 lb 8.2 oz)    Body mass index is 41.27 kg/m.  Estimated Creatinine Clearance: 85 mL/min (by C-G formula based on SCr of 1.02 mg/dL).   Based on [facility] policy patient is candidate for enoxaparin  0.5mg /kg TBW SQ every 24 hours based on BMI being >30.  DESCRIPTION: Pharmacy has adjusted enoxaparin  dose per [facility] policy.  Patient is now receiving enoxaparin  60 mg every 24 hours    Kayla JULIANNA Blew, PharmD Clinical Pharmacist  03/17/2024 4:54 PM

## 2024-03-17 NOTE — Progress Notes (Signed)
 Patient ID: Alfred Carney, male   DOB: [birth year], 73 y.o.   MRN: [id number]  Subjective: The patient continues to note moderate severe pain in his lower back as well as in his right greater than left lower extremity.  He was able to sit at the edge of the bed with OT today, but was not able to stand up or take any steps.  He did undergo his lumbar MRI scan this morning.  Objective: Vital signs in last 24 hours: Temp:  [97.8 F (36.6 C)-98.8 F (37.1 C)] 97.8 F (36.6 C) (07/02 1207) Pulse Rate:  [66-76] 75 (07/02 1207) Resp:  [18-22] 18 (07/02 0325) BP: (124-152)/(47-62) 124/60 (07/02 1207) SpO2:  [95 %-100 %] 100 % (07/02 1207) Weight:  [128.6 kg] 128.6 kg (07/02 0515)  Intake/Output from previous day: 07/01 0701 - 07/02 0700 In: 0  Out: 3050 [Urine:3050] Intake/Output this shift: Total I/O In: 360 [P.O.:360] Out: 750 [Urine:750]  No results for input(s): HGB in the last 72 hours. No results for input(s): WBC, RBC, HCT, PLT in the last 72 hours. Recent Labs    03/16/24 0328  NA 141  K 4.5  CL 96*  CO2 34*  BUN 30*  CREATININE 1.02  GLUCOSE 191*  CALCIUM  8.9   No results for input(s): LABPT, INR in the last 72 hours.  Physical Exam: Orthopedic examination again is focused on his back and lower extremities.  The findings are as noted in yesterday's note.  He is able to actively flex and extend both knees and ankles.  He does have tenderness to palpation over the anterior and medial aspects of both ankles as well as along the medial aspect of his right knee and leg.  He has a positive supine straight leg raise on the right at 25 to 30 degrees and on the left at 45 degrees.  Otherwise, he is grossly neurovascularly intact in both lower extremities and feet.  X-rays: Recent AP and lateral x-rays of the lumbar spine are available for review and have been reviewed by myself.  These films demonstrate multilevel degenerative disc disease with facet hypertrophy.  There  is no evidence of spondylolysis or spondylolisthesis.  No lytic lesions or fractures are identified.  A recent MRI scan of the lumbar spine also is available for review and has been reviewed by myself.  By report, the scan demonstrates moderate to severe multifactorial spinal stenosis at L3-4, moderate at L2-3 and L4-5.  The scan also demonstrates evidence of moderate to severe lateral recess stenosis at the left L3, bilateral L4, right greater than left L5, and left S1 nerve levels, as well as severe neural foraminal stenosis at the left L3, right L4, and left L5 nerve levels.  Assessment: Severe lumbar spondylosis with multilevel central and bilateral foraminal stenosis.  Plan: The treatment options have been reviewed with the patient.  Based on the results of the lumbar MRI scan, as well as his history and exam findings, I do feel that the majority of his symptoms are actually coming from his back rather than from his knee or ankles.  Therefore, I would recommend that neurosurgery be consulted to provide input as to the best way to manage this patient.  He is not a great surgical candidate, so he might benefit from a lumbar epidural steroid injection versus foraminal injections.  Meanwhile, he may to continue to be mobilized with physical therapy as symptoms permit, and the provided medication for pain as deemed appropriate from a medical standpoint.  Durinda Buzzelli J Georgian Mcclory 03/17/2024, 3:10 PM

## 2024-03-17 NOTE — TOC Progression Note (Signed)
 Transition of Care [facility]) - Progression Note    Patient Details  Name: Alfred Carney MRN: [id number] Date of Birth: [birth year]  [facility] CM/SW Contact  Tomasa JAYSON Childes, RN Phone Number: 03/17/2024, 3:04 PM  Clinical Narrative:    Per [facility] assistant [place], SNF approved auth id #[id number] 03/17/24-03/19/24  MD made aware.    Expected Discharge Plan: Skilled Nursing Facility    Expected Discharge Plan and Services                           DME Agency: [employer]                   Social Determinants of Health (SDOH) Interventions SDOH Screenings   Food Insecurity: No Food Insecurity (03/16/2024)  Housing: Unknown (03/16/2024)  Transportation Needs: No Transportation Needs (03/16/2024)  Utilities: Not At Risk (03/12/2024)  Alcohol Screen: Low Risk  (05/09/2023)  Depression (PHQ2-9): Medium Risk (11/18/2023)  Financial Resource Strain: Low Risk  (03/16/2024)  Physical Activity: Insufficiently Active (05/27/2023)  Social Connections: Moderately Integrated (03/12/2024)  Stress: No Stress Concern Present (05/27/2023)  Tobacco Use: Medium Risk (03/16/2024)  Health Literacy: Adequate Health Literacy (05/12/2023)    Readmission Risk Interventions     No data to display

## 2024-03-18 DIAGNOSIS — M48061 Spinal stenosis, lumbar region without neurogenic claudication: Secondary | ICD-10-CM | POA: Diagnosis not present

## 2024-03-18 DIAGNOSIS — M5416 Radiculopathy, lumbar region: Secondary | ICD-10-CM | POA: Diagnosis not present

## 2024-03-18 DIAGNOSIS — I5033 Acute on chronic diastolic (congestive) heart failure: Secondary | ICD-10-CM | POA: Diagnosis not present

## 2024-03-18 LAB — GLUCOSE, CAPILLARY
Glucose-Capillary: 246 mg/dL — ABNORMAL HIGH (ref 70–99)
Glucose-Capillary: 245 mg/dL — ABNORMAL HIGH (ref 70–99)
Glucose-Capillary: 197 mg/dL — ABNORMAL HIGH (ref 70–99)
Glucose-Capillary: 327 mg/dL — ABNORMAL HIGH (ref 70–99)

## 2024-03-18 MED ORDER — DEXAMETHASONE 1 MG PO TABS: ORAL_TABLET | ORAL | Status: DC

## 2024-03-18 MED ORDER — ASPIRIN 81 MG PO TBEC
81.0000 mg | DELAYED_RELEASE_TABLET | Freq: Every day | ORAL | Status: DC
  Administered 2024-03-18 – 2024-03-23 (×6): 81 mg via ORAL
  Filled 2024-03-18 (×6): qty 1

## 2024-03-18 MED ORDER — OXYCODONE HCL 5 MG PO TABS: 5.0000 mg | ORAL_TABLET | Freq: Four times a day (QID) | ORAL | 0 refills | Status: DC | PRN

## 2024-03-18 MED ORDER — LIDOCAINE 5 % EX PTCH: 3.0000 | MEDICATED_PATCH | CUTANEOUS | Status: DC

## 2024-03-18 MED ORDER — ENOXAPARIN SODIUM 80 MG/0.8ML IJ SOSY
0.5000 mg/kg | PREFILLED_SYRINGE | Freq: Every day | INTRAMUSCULAR | Status: DC
  Administered 2024-03-18 – 2024-03-22 (×5): 65 mg via SUBCUTANEOUS
  Filled 2024-03-18 (×4): qty 0.65
  Filled 2024-03-18: qty 0.8

## 2024-03-18 MED ORDER — ASPIRIN 81 MG PO TBEC: 81.0000 mg | DELAYED_RELEASE_TABLET | Freq: Every day | ORAL | Status: AC

## 2024-03-18 MED ORDER — PREGABALIN 50 MG PO CAPS: 50.0000 mg | ORAL_CAPSULE | Freq: Two times a day (BID) | ORAL | Status: DC

## 2024-03-18 MED ORDER — DILTIAZEM HCL ER COATED BEADS 120 MG PO CP24: 120.0000 mg | ORAL_CAPSULE | Freq: Every day | ORAL | Status: DC

## 2024-03-18 NOTE — Discharge Summary (Incomplete Revision)
 Physician Discharge Summary   Alfred Carney  male DOB: 1950-12-25  FMW:[id number]  PCP: Corwin Antu, FNP  Admit date: 03/11/2024 Discharge date: 03/18/2024  Admitted From: home Disposition:  SNF rehab Son and wife updated at bedside prior to discharge. CODE STATUS: Full code   Hospital Course:  For full details, please see H&P, progress notes, consult notes and ancillary notes.  Briefly,  Alfred Carney is a 73 year old male with history of non-insulin -dependent diabetes mellitus, PAD, hypertension, history of gout, COPD on chronic 2 L nasal cannula at baseline, history of heart failure preserved ejection fraction, who presented to emergency department for chief concerns of weakness for 10 days.    Paroxysmal atrial tachycardia Acute diastolic congestive heart failure -- initial EKG showed atrial fibrillation however rhythm strip evaluated by cardiology showed PACs with sinus tachycardia.  [facility] cardiology input noted, favored secondary to untreated sleep apnea/obesity hypoventilation syndrome.  Echo EF55-60%. -- ordered Cardizem 30 mg Q8 and transitioned to Cardizem 120 mg daily.  Since pt was started on Cardizem, home Toprol  was d/c'ed. --received IV lasix  for 2 days with good urine output, discharged on oral lasix  40 mg daily.   Leukocytosis possible UTI -- patient not able to give any symptoms. However has abnormal UA and with weakness so treated with three doses of Rocephin . -- Urine culture negative   Chronic respiratory failure with chronic emphysema and untreated sleep apnea -- patient wears 2 L chronically oxygen  --Patient started using CPAP at nighttime. Tolerating it well.    Generalized weakness deconditioning/recent fall --  PT/OT--rehab. Pt very deconditioned   Type II diabetes with hyperglycemia --resume home glipizide  after discharge.   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 No acute osseous abnormality. 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  Moderate to severe lateral recess stenosis at the left L3, bilateral L4, right greater than left L5, and left S1 nerve levels.  Severe neural foraminal stenosis at the left L3, right L4, and left L5 nerve levels. -- Neurosurgery consultation with Dr. Claudene-- he recommended Decadron  with taper.  Per Dr. Claudene, no isolated, localized radiculopathy and more like a plexopathy or multilevel radiculitis, and therefore no great benefit for inpatient IR steroid injection. --started on Lyrica  Bilateral ankle, right knee pain  Possible gout flare -- patient said he has history of gout, and pain felt like gout flare.  Already on allopurinol  --Ortho consult with Dr Edie -- x-ray right knee--knee effusion.  --PRN oxycodone  and lidocaine  patches help.  Will continue steroid with Decadron  (see above) which will also treat possible gout flare.   Unless noted above, medications under STOP list are ones pt was not taking PTA.  Discharge Diagnoses:  Principal Problem:   Acute on chronic congestive heart failure (HCC) Active Problems:   Leukocytosis   CAD in native artery   PAD (peripheral artery disease) (HCC)   Hyperlipidemia   Chronic respiratory failure with hypoxia and hypercapnia (HCC)   Venous stasis dermatitis of both lower extremities   Hypertension associated with diabetes (HCC)   Hyperlipidemia associated with type 2 diabetes mellitus (HCC)   BPH with obstruction/lower urinary tract symptoms   History of gout   Hypokalemia   CKD stage 3a, GFR 45-59 ml/min (HCC)   New onset atrial fibrillation (HCC)   Paroxysmal atrial tachycardia (HCC)   Weakness   Acute pain  of right knee   30 Day Unplanned Readmission Risk Score    Flowsheet Row ED to Hosp-Admission (Current) from 03/11/2024 in [facility]  30  Day Unplanned Readmission Risk Score (%) 17.57 Filed at 03/18/2024 1200    This score is the patient's risk of an unplanned readmission within 30 days of being discharged (0 -100%). The score is based on dignosis, age, lab data, medications, orders, and past utilization.   Low:  0-14.9   Medium: 15-21.9   High: 22-29.9   Extreme: 30 and above         Discharge Instructions:  Allergies as of 03/18/2024   No Known Allergies      Medication List     STOP taking these medications    clobetasol  cream 0.05 % Commonly known as: TEMOVATE    ibuprofen  200 MG tablet Commonly known as: ADVIL    ketoconazole  2 % shampoo Commonly known as: NIZORAL    metoprolol  succinate 25 MG 24 hr tablet Commonly known as: TOPROL -XL   nitroGLYCERIN  0.4 MG SL tablet Commonly known as: NITROSTAT        TAKE these medications    albuterol  108 (90 Base) MCG/ACT inhaler Commonly known as: VENTOLIN  HFA Inhale 2 puffs into the lungs every 4 (four) hours as needed for wheezing or shortness of breath.   allopurinol  100 MG tablet Commonly known as: ZYLOPRIM  Take 2 tablets (200 mg total) by mouth daily.   aspirin  EC 81 MG tablet Take 1 tablet (81 mg total) by mouth daily. Swallow whole. Start taking on: March 19, 2024   Calcium  Carbonate-Simethicone  750-80 MG Chew Chew 1 tablet by mouth as needed (gas).   dexamethasone  1 MG tablet Commonly known as: DECADRON  Take 3 tablets (3 mg total) by mouth every 6 (six) hours for 1 day, THEN 2 tablets (2 mg total) every 6 (six) hours for 1 day, THEN 1 tablet (1 mg total) every 6 (six) hours for 1 day, THEN 1 tablet (1 mg total) 3 (three) times daily for 1 day, THEN 1 tablet (1 mg total) 2 (two) times daily for 1 day, THEN 1 tablet (1 mg total) daily for 1 day. Start taking on: March 18, 2024   diltiazem 120 MG 24 hr capsule Commonly known as: CARDIZEM CD Take 1 capsule (120 mg total) by mouth daily. Start taking on: March 19, 2024   furosemide  20 MG  tablet Commonly known as: LASIX  Take 2 tablets by mouth once daily   glipiZIDE  5 MG tablet Commonly known as: GLUCOTROL  TAKE 1 TABLET BY MOUTH TWICE DAILY BEFORE A MEAL   glucose blood test strip Use as instructed   irbesartan  300 MG tablet Commonly known as: AVAPRO  Take 1 tablet by mouth once daily   Lancet Device Misc 1 Lancet by Does not apply route daily as needed.   lidocaine  5 % Commonly known as: LIDODERM  Place 3 patches onto the skin daily. To right knee and both ankles Start taking on: March 19, 2024   meloxicam  7.5 MG tablet Commonly known as: MOBIC  Take 1 tablet by mouth once daily   oxyCODONE  5 MG immediate release tablet Commonly known as: Oxy IR/ROXICODONE  Take 1 tablet (5 mg total) by mouth every 6 (six) hours as needed for severe pain (pain score 7-10).   OXYGEN  Inhale 2 L into the lungs continuous.   pravastatin  40 MG tablet Commonly known as: PRAVACHOL  Take 1 tablet (40 mg total) by mouth daily.   pregabalin 50 MG capsule Commonly known  as: LYRICA Take 1 capsule (50 mg total) by mouth 2 (two) times daily.   tacrolimus 0.1 % ointment Commonly known as: PROTOPIC Apply topically 2 (two) times daily. 2 weeks   tamsulosin  0.4 MG Caps capsule Commonly known as: FLOMAX  Take 1 capsule by mouth at bedtime   VITAMIN D  PO Take 1,000 Units by mouth daily.         Contact information for follow-up providers     [facility] .   Contact information: [address] [place], [place] [address]  [phone]        [facility]. Go on 03/26/2024.   Specialty: Cardiology Why: Hospital Follow-Up 03/26/24 @ 1:30 PM Please bring all medications to follow-up appt. [facility], [address], [employer] Parking at the door. Contact information: [address] [place]  [address] [phone]             Contact information  for after-discharge care     Destination     [facility] .   Service: Skilled Nursing Contact information: [address] [place]  [address] [phone]                     No Known Allergies   The results of significant diagnostics from this hospitalization (including imaging, microbiology, ancillary and laboratory) are listed below for reference.   Consultations:   Procedures/Studies: DG Chest Port 1 View Result Date: 03/17/2024 CLINICAL DATA:  Shortness of breath EXAM: PORTABLE CHEST 1 VIEW COMPARISON:  Chest x-ray 03/11/2024. FINDINGS: Normal cardiopericardial silhouette with calcified aorta. Persistent small left pleural effusion. No pneumothorax, right effusion or edema. No consolidation. Films are under penetrated. Degenerative changes of the spine. IMPRESSION: Stable left-sided small pleural effusion.  Chronic lung changes. Electronically Signed   By: Ranell Bring M.D.   On: 03/17/2024 13:41   MR LUMBAR SPINE WO CONTRAST Result Date: 03/17/2024 CLINICAL DATA:  73 year old male with low back pain.  Prior surgery. EXAM: MRI LUMBAR SPINE WITHOUT CONTRAST TECHNIQUE: Multiplanar, multisequence MR imaging of the lumbar spine was performed. No intravenous contrast was administered. COMPARISON:  Lumbar radiographs yesterday.  Lumbar MRI 05/12/2019. FINDINGS: Segmentation: Normal on the comparison radiographs, the same numbering system used on the previous MRI. Alignment: Chronic mildly exaggerated lumbar lordosis is stable since 2020. No significant scoliosis or spondylolisthesis. Vertebrae: Normal background bone marrow signal. Bulky chronic widespread degenerative vertebral endplate spurring in the lower thoracic and the lumbar spine. No marrow edema or evidence of acute osseous abnormality. Intact visible sacrum and SI joints. Conus medullaris and cauda equina: Conus extends to the L1 level. No lower  spinal cord or conus signal abnormality. Paraspinal and other soft tissues: Infrarenal abdominal aortic aneurysm, diameter estimated at 3.9 cm on series 8, image 17. Unclear whether this has been previously repaired, but this does appear grossly stable from CTA abdomen 01/28/2018. Negative other visible abdominal viscera. Negative visualized posterior paraspinal soft tissues. Disc levels: T12-L1: Anterior disc osteophyte complex. Mild facet hypertrophy. No stenosis. L1-L2: Disc space loss with more circumferential disc osteophyte complex. No stenosis. L2-L3: Circumferential disc osteophyte complex. Asymmetric left subarticular component with superimposed mild to moderate facet, mild ligament flavum hypertrophy. Moderate spinal stenosis. Moderate to severe left lateral recess stenosis (descending left L3 nerve level), moderate left L2 foraminal stenosis. L3-L4: Disc space loss with bulky circumferential disc osteophyte complex asymmetric to  the right. Up to moderate facet and ligament flavum hypertrophy. Moderate to severe spinal stenosis (series 8, image 20 and series 5, image 8). Severe left and moderate right L3 foraminal stenosis. L4-L5: Disc space loss with bulky circumferential disc osteophyte complex asymmetric to the right. Moderate facet and ligament flavum hypertrophy. Moderate spinal stenosis, moderate to severe lateral recess stenosis greater on the right (L5 nerve levels). Mild left and severe right L4 neural foraminal stenosis (series 5, image 4 on the right). L5-S1: Posterior disc space loss. Bulky circumferential disc osteophyte complex. Mild to moderate facet and ligament flavum hypertrophy. No spinal stenosis, but moderate lateral recess stenosis on the left (left S1 nerve level). Severe left and mild right L5 foraminal stenosis (series 5, image 13 on the left). IMPRESSION: 1. Infrarenal Abdominal Aortic Aneurysm, diameter estimated at 3.9 cm, grossly stable from CTA Abdomen 01/28/2018. Recommend  follow-up ultrasound every 3 years. (Ref.: J Vasc Surg. 2018; 67:2-77 and J Am Coll Radiol 2013;10(10):789-794.) 2. Widespread chronic lumbar spine bulky disc, endplate, and posterior element degeneration. No acute osseous abnormality. 3. 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 Moderate to severe lateral recess stenosis at the left L3, bilateral L4, right greater than left L5, and left S1 nerve levels. Severe neural foraminal stenosis at the left L3, right L4, and left L5 nerve levels. Electronically Signed   By: VEAR Hurst M.D.   On: 03/17/2024 12:54   DG Lumbar Spine 2-3 Views Result Date: 03/16/2024 CLINICAL DATA:  [id number] Other spondylosis with radiculopathy, lumbar region [id number] EXAM: LUMBAR SPINE - 2-3 VIEW COMPARISON:  Lumbar radiograph 04/28/2019 FINDINGS: Five non-rib-bearing lumbar vertebra. Minimal broad-based levo scoliotic curvature. No fracture compression deformity. Diffuse degenerative disc disease with disc space narrowing and spurring throughout, most prominently affecting L3-L4 and L4-L5. Moderate multilevel facet hypertrophy. IMPRESSION: 1. Diffuse degenerative disc disease and facet hypertrophy. 2. Minimal broad-based levo scoliotic curvature. Electronically Signed   By: Andrea Gasman M.D.   On: 03/16/2024 20:06   DG Ankle Complete Left Result Date: 03/16/2024 CLINICAL DATA:  Ankle pain and weakness. EXAM: LEFT ANKLE COMPLETE - 3+ VIEW COMPARISON:  Left ankle radiographs 05/02/2018 FINDINGS: There is resection of the proximal aspect of the fibular diaphysis again extending off the superior plane of view. Moderate degenerative spurring at the distal aspect of the medial and lateral malleoli. There are again chronic ossicles just distal to the fibula. The ankle mortise is symmetric and intact. Moderate anterior and mild posterior distal tibial plafond degenerative osteophytosis. Mild plantar and posterior calcaneal heel spurs. Moderate to  high-grade dorsal tarsometatarsal degenerative osteophytes on lateral view, unchanged. There is again mineralization along the proximal aspect of the plantar fascia at the level of the anterior process of the calcaneus. No acute fracture or dislocation. IMPRESSION: 1. Remote resection of the proximal aspect of the fibular diaphysis. 2. Moderate medial and lateral ankle degenerative osteophytes. 3. Moderate to high-grade dorsal tarsometatarsal degenerative osteophytosis. 4. Mild plantar and posterior calcaneal heel spurs. Electronically Signed   By: Tanda Lyons M.D.   On: 03/16/2024 15:30   DG Ankle Complete Right Result Date: 03/16/2024 CLINICAL DATA:  Right ankle pain and weakness. EXAM: RIGHT ANKLE - COMPLETE 3+ VIEW COMPARISON:  None Available. FINDINGS: Densities in the patient's socks somewhat limit evaluation of fine bony detail. There are multiple chronic ossicles distal to the medial malleolus, the sequela of remote trauma. Mild plantar and posterior calcaneal heel spurs. Mild distal anterior tibial plafond greater than  posterior tibial plafond degenerative osteophytosis. The ankle mortise is symmetric and intact. No acute fracture or dislocation. IMPRESSION: 1. Mild plantar and posterior calcaneal heel spurs. 2. Mild distal anterior tibial plafond greater than posterior tibial plafond degenerative osteophytosis. 3. Multiple chronic ossicles distal to the medial malleolus, the sequela of remote trauma. Electronically Signed   By: Tanda Lyons M.D.   On: 03/16/2024 15:28   DG Knee Complete 4 Views Right Result Date: 03/15/2024 CLINICAL DATA:  Pain after fall EXAM: RIGHT KNEE - COMPLETE 4+ VIEW COMPARISON:  None Available. FINDINGS: No definitive fracture or malalignment. Chondrocalcinosis. Positive for knee effusion. Relatively patent joint spaces IMPRESSION: 1. No definitive acute osseous abnormality. Knee effusion. Cross-sectional imaging follow-up may be performed if high suspicion for acute  osseous injury. Electronically Signed   By: Luke Bun M.D.   On: 03/15/2024 16:12   ECHOCARDIOGRAM COMPLETE Result Date: 03/12/2024    ECHOCARDIOGRAM REPORT   Patient Name:   Alfred Carney Date of Exam: 03/12/2024 Medical Rec #:  [id number]    Height:       69.0 in Accession #:    [id number]   Weight:       322.0 lb Date of Birth:  04/15/1951     BSA:          2.529 m Patient Age:    73 years     BP:           102/85 mmHg Patient Gender: M            HR:           93 bpm. Exam Location:  [facility] Procedure: 2D Echo, Cardiac Doppler, Color Doppler and Intracardiac            Opacification Agent (Both Spectral and Color Flow Doppler were            utilized during procedure). Indications:     Dyspnea R06.00                  Atrial Fibrillation I48.91  History:         Patient has prior history of Echocardiogram examinations, most                  recent 06/10/2020. Signs/Symptoms:Shortness of Breath.  Sonographer:     Ashley McNeely-Sloane Referring Phys:  [id number] AMY N COX Diagnosing Phys: Deatrice Cage MD IMPRESSIONS  1. Left ventricular ejection fraction, by estimation, is 55 to 60%. The left ventricle has normal function. Left ventricular endocardial border not optimally defined to evaluate regional wall motion. Left ventricular diastolic parameters are consistent with Grade I diastolic dysfunction (impaired relaxation).  2. Right ventricular systolic function is normal. The right ventricular size is mildly enlarged. Tricuspid regurgitation signal is inadequate for assessing PA pressure.  3. The mitral valve was not well visualized. No evidence of mitral valve regurgitation. No evidence of mitral stenosis.  4. The aortic valve was not well visualized. Aortic valve regurgitation is not visualized. No aortic stenosis is present.  5. Challenging image quality. FINDINGS  Left Ventricle: Left ventricular ejection fraction, by estimation, is 55 to 60%. The left ventricle has normal function. Left ventricular endocardial  border not optimally defined to evaluate regional wall motion. Definity  contrast agent was given IV to delineate the left ventricular endocardial borders. The left ventricular internal cavity size was normal in size. There is no left ventricular hypertrophy. Left ventricular diastolic parameters are consistent with Grade I diastolic dysfunction (impaired relaxation). Right  Ventricle: The right ventricular size is mildly enlarged. No increase in right ventricular wall thickness. Right ventricular systolic function is normal. Tricuspid regurgitation signal is inadequate for assessing PA pressure. Left Atrium: Left atrial size was normal in size. Right Atrium: Right atrial size was normal in size. Pericardium: There is no evidence of pericardial effusion. Mitral Valve: The mitral valve was not well visualized. No evidence of mitral valve regurgitation. No evidence of mitral valve stenosis. MV peak gradient, 4.8 mmHg. The mean mitral valve gradient is 2.0 mmHg. Tricuspid Valve: The tricuspid valve is not well visualized. Tricuspid valve regurgitation is not demonstrated. No evidence of tricuspid stenosis. Aortic Valve: The aortic valve was not well visualized. Aortic valve regurgitation is not visualized. No aortic stenosis is present. Aortic valve mean gradient measures 3.0 mmHg. Aortic valve peak gradient measures 5.7 mmHg. Aortic valve area, by VTI measures 2.25 cm. Pulmonic Valve: The pulmonic valve was not well visualized. Pulmonic valve regurgitation is not visualized. No evidence of pulmonic stenosis. Aorta: The aortic root is normal in size and structure. Venous: The inferior vena cava was not well visualized. IAS/Shunts: No atrial level shunt detected by color flow Doppler.  LEFT VENTRICLE PLAX 2D LVOT diam:     1.90 cm     Diastology LV SV:         45          LV e' medial:    4.03 cm/s LV SV Index:   18          LV E/e' medial:  14.0 LVOT Area:     2.84 cm    LV e' lateral:   8.38 cm/s                             LV E/e' lateral: 6.8  LV Volumes (MOD) LV vol d, MOD A2C: 55.7 ml LV vol d, MOD A4C: 51.6 ml LV vol s, MOD A2C: 35.7 ml LV vol s, MOD A4C: 28.5 ml LV SV MOD A2C:     20.0 ml LV SV MOD A4C:     51.6 ml LV SV MOD BP:      19.6 ml RIGHT VENTRICLE RV Basal diam:  4.50 cm RV S prime:     12.90 cm/s TAPSE (M-mode): 1.6 cm LEFT ATRIUM           Index       RIGHT ATRIUM           Index LA Vol (A4C): 22.8 ml 9.01 ml/m  RA Area:     15.30 cm                                   RA Volume:   39.80 ml  15.73 ml/m  AORTIC VALVE AV Area (Vmax):    2.25 cm AV Area (Vmean):   2.17 cm AV Area (VTI):     2.25 cm AV Vmax:           119.00 cm/s AV Vmean:          80.100 cm/s AV VTI:            0.199 m AV Peak Grad:      5.7 mmHg AV Mean Grad:      3.0 mmHg LVOT Vmax:         94.40 cm/s LVOT Vmean:  61.200 cm/s LVOT VTI:          0.158 m LVOT/AV VTI ratio: 0.79  AORTA Ao Root diam: 3.50 cm MITRAL VALVE MV Area (PHT): 3.31 cm     SHUNTS MV Area VTI:   2.01 cm     Systemic VTI:  0.16 m MV Peak grad:  4.8 mmHg     Systemic Diam: 1.90 cm MV Mean grad:  2.0 mmHg MV Vmax:       1.09 m/s MV Vmean:      72.2 cm/s MV Decel Time: 229 msec MV E velocity: 56.60 cm/s MV A velocity: 101.00 cm/s MV E/A ratio:  0.56 Deatrice Cage MD Electronically signed by Deatrice Cage MD Signature Date/Time: 03/12/2024/3:36:07 PM    Final    DG Chest 1 View Result Date: 03/11/2024 CLINICAL DATA:  sob EXAM: CHEST  1 VIEW COMPARISON:  November 19, 2017 FINDINGS: Biapical pleural thickening. Emphysema. Unchanged chronic blunting of the left costophrenic sulcus. No new airspace consolidation or pleural effusion. Mild cardiomegaly. Tortuous aorta with aortic atherosclerosis. No acute fracture or destructive lesions. Multilevel thoracic osteophytosis. IMPRESSION: Emphysema.  No acute cardiopulmonary abnormality. Electronically Signed   By: Rogelia Myers M.D.   On: 03/11/2024 08:37      Labs: BNP (last 3 results) Recent Labs    03/11/24 0818   BNP 116.1*   Basic Metabolic Panel: Recent Labs  Lab 03/12/24 0536 03/13/24 0836 03/16/24 0328  NA 140 138 141  K 3.6 3.5 4.5  CL 96* 94* 96*  CO2 32 33* 34*  GLUCOSE 165* 168* 191*  BUN 23 23 30*  CREATININE 1.33* 1.28* 1.02  CALCIUM  8.4* 8.4* 8.9   Liver Function Tests: No results for input(s): AST, ALT, ALKPHOS, BILITOT, PROT, ALBUMIN  in the last 168 hours. No results for input(s): LIPASE, AMYLASE in the last 168 hours. No results for input(s): AMMONIA in the last 168 hours. CBC: Recent Labs  Lab 03/12/24 0536  WBC 16.4*  HGB 12.8*  HCT 39.9  MCV 90.3  PLT 356   Cardiac Enzymes: No results for input(s): CKTOTAL, CKMB, CKMBINDEX, TROPONINI in the last 168 hours. BNP: Invalid input(s): POCBNP CBG: Recent Labs  Lab 03/17/24 1658 03/17/24 1953 03/17/24 2232 03/18/24 0749 03/18/24 1218  GLUCAP 155* 219* 230* 246* 245*   D-Dimer No results for input(s): DDIMER in the last 72 hours. Hgb A1c No results for input(s): HGBA1C in the last 72 hours. Lipid Profile No results for input(s): CHOL, HDL, LDLCALC, TRIG, CHOLHDL, LDLDIRECT in the last 72 hours. Thyroid  function studies No results for input(s): TSH, T4TOTAL, T3FREE, THYROIDAB in the last 72 hours.  Invalid input(s): FREET3 Anemia work up No results for input(s): VITAMINB12, FOLATE, FERRITIN, TIBC, IRON, RETICCTPCT in the last 72 hours. Urinalysis    Component Value Date/Time   COLORURINE YELLOW (A) 03/11/2024 1124   APPEARANCEUR CLEAR (A) 03/11/2024 1124   LABSPEC 1.018 03/11/2024 1124   PHURINE 5.0 03/11/2024 1124   GLUCOSEU NEGATIVE 03/11/2024 1124   HGBUR NEGATIVE 03/11/2024 1124   BILIRUBINUR NEGATIVE 03/11/2024 1124   KETONESUR NEGATIVE 03/11/2024 1124   PROTEINUR 30 (A) 03/11/2024 1124   UROBILINOGEN 1.0 05/05/2014 1706   NITRITE NEGATIVE 03/11/2024 1124   LEUKOCYTESUR NEGATIVE 03/11/2024 1124   Sepsis Labs Recent Labs   Lab 03/12/24 0536  WBC 16.4*   Microbiology Recent Results (from the past 240 hours)  Urine Culture (for pregnant, neutropenic or urologic patients or patients with an indwelling urinary catheter)     Status:  None   Collection Time: 03/12/24  6:00 PM   Specimen: Urine, Catheterized  Result Value Ref Range Status   Specimen Description   Final    URINE, CATHETERIZED Performed at [facility], [address] [place], [place] [address]    Special Requests   Final    NONE Performed at [facility], [address] [place], [place] [address]    Culture   Final    NO GROWTH Performed at [facility], [address], [place], [place] [address]    Report Status 03/14/2024 FINAL  Final     Total time spend on discharging this patient, including the last patient exam, discussing the hospital stay, instructions for ongoing care as it relates to all pertinent caregivers, as well as preparing the medical discharge records, prescriptions, and/or referrals as applicable, is 45 [email or web address]    [name], MD  [facility] 03/18/2024, 2:49 PM

## 2024-03-18 NOTE — Discharge Summary (Addendum)
 Physician Discharge Summary   Alfred Carney  male DOB: 05-Aug-1951  FMW:[id number]  PCP: Corwin Antu, FNP  Admit date: 03/11/2024 Discharge date: 03/23/2024  Admitted From: home Disposition:  SNF rehab CODE STATUS: Full code  Discharge Instructions     Diet - low sodium heart healthy   Complete by: As directed       Hospital Course:  For full details, please see H&P, progress notes, consult notes and ancillary notes.  Briefly,  Alfred Carney is a 73 year old male with history of non-insulin -dependent diabetes mellitus, PAD, hypertension, history of gout, COPD on chronic 2 L nasal cannula at baseline, history of heart failure preserved ejection fraction, who presented to emergency department for chief concerns of weakness for 10 days.    Paroxysmal atrial tachycardia Acute diastolic congestive heart failure -- initial EKG showed atrial fibrillation however rhythm strip evaluated by cardiology showed PACs with sinus tachycardia.  [facility] cardiology input noted, favored secondary to untreated sleep apnea/obesity hypoventilation syndrome.  Echo EF55-60%. --ordered Cardizem  30 mg Q8 and transitioned to Cardizem  120 mg daily.  Since pt was started on Cardizem , home Toprol  was d/c'ed. --received IV lasix  for 2 days after presentation, and 4 days prior to discharge with good urine output and improvement in swelling.  Discharged on home oral lasix  40 mg daily.   Leukocytosis possible UTI -- patient not able to give any symptoms. However has abnormal UA and with weakness so treated with three doses of Rocephin . -- Urine culture negative   Chronic respiratory failure with chronic emphysema  2L O2 at baseline --stable at 2L O2  OSA non-compliant with CPAP --pt has CPAP machine at home but was not wearing it.  With encouragement, patient started using CPAP at nighttime during hospitalization. --cont to use CPAP nightly, however, SNF doesn't have to wait for a CPAP machine before accepting  pt.  Pt can also use his home CPAP.   Generalized weakness deconditioning/recent fall --  PT/OT--rehab.  Pt very deconditioned   Type II diabetes  Hyperglycemia exacerbated by steroid use --pt was receiving mealtime insulin  10u TID and resistant SSI scale during hospitalization.  A1c returned worsened at 7.9, so pt was started on glargine 10u daily.   --cont BG check and SSI TID while pt is on steroid. --resume home glipizide  after discharge.   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 No acute osseous abnormality. 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  Moderate to severe lateral recess stenosis at the left L3, bilateral L4, right greater than left L5, and left S1 nerve levels.  Severe neural foraminal stenosis at the left L3, right L4, and left L5 nerve levels. -- Neurosurgery consultation with Dr. Claudene-- he recommended Decadron  with taper.  Per Dr. Claudene, no isolated, localized radiculopathy and more like a plexopathy or multilevel radiculitis, and therefore no great benefit for inpatient IR steroid injection. --started on Lyrica  --cont decadron  taper as below  Bilateral ankle, right knee pain  Possible gout flare -- patient said he has history of gout, and pain felt like gout flare.  Already on allopurinol  --Ortho consult with Dr Edie -- x-ray right knee--knee effusion.  --pain improved with steroid, cont decadron  taper as below. --cont lidocaine  patches.  Hyperkalemia, resolved --home irbesartan  d/c'ed.     Unless noted above, medications under STOP list are ones pt was not taking PTA.  Discharge Diagnoses:  Principal Problem:   Acute on chronic congestive heart failure (HCC) Active Problems:   Leukocytosis   CAD in native artery   PAD (peripheral artery disease) (HCC)   Hyperlipidemia   Chronic  respiratory failure with hypoxia and hypercapnia (HCC)   Venous stasis dermatitis of both lower extremities   Hypertension associated with diabetes (HCC)   Hyperlipidemia associated with type 2 diabetes mellitus (HCC)   BPH with obstruction/lower urinary tract symptoms   History of gout   Hypokalemia   CKD stage 3a, GFR 45-59 ml/min (HCC)   New onset atrial fibrillation (HCC)   Paroxysmal atrial tachycardia (HCC)   Weakness   Acute pain of right knee   30 Day Unplanned Readmission Risk Score    Flowsheet Row ED to Hosp-Admission (Current) from 03/11/2024 in [facility]  30 Day Unplanned Readmission Risk Score (%) 17.57 Filed at 03/18/2024 1200    This score is the patient's risk of an unplanned readmission within 30 days of being discharged (0 -100%). The score is based on dignosis, age, lab data, medications, orders, and past utilization.   Low:  0-14.9   Medium: 15-21.9   High: 22-29.9   Extreme: 30 and above         Discharge Instructions:  Allergies as of 03/23/2024   No Known Allergies      Medication List     STOP taking these medications    clobetasol  cream 0.05 % Commonly known as: TEMOVATE    ibuprofen  200 MG tablet Commonly known as: ADVIL    irbesartan  300 MG tablet Commonly known as: AVAPRO    ketoconazole  2 % shampoo Commonly known as: NIZORAL    metoprolol  succinate 25 MG 24 hr tablet Commonly known as: TOPROL -XL   nitroGLYCERIN  0.4 MG SL tablet Commonly known as: NITROSTAT        TAKE these medications    albuterol  108 (90 Base) MCG/ACT inhaler Commonly known as: VENTOLIN  HFA Inhale 2 puffs into the lungs every 4 (four) hours as needed for wheezing or shortness of breath.   allopurinol  100 MG tablet Commonly known as: ZYLOPRIM  Take 2 tablets (200 mg total) by mouth daily.   aspirin  EC 81 MG tablet Take 1 tablet (81 mg total) by mouth daily. Swallow whole.   Calcium  Carbonate-Simethicone  750-80 MG Chew Chew 1 tablet  by mouth as needed (gas).   dexamethasone  1 MG tablet Commonly known as: DECADRON  Take 1 tablet (1 mg total) by mouth 3 (three) times daily for 1 day, THEN 1 tablet (1 mg total) 2 (two) times daily for 1 day, THEN 1 tablet (1 mg total) daily for 1 day. Start taking on: March 23, 2024   diltiazem  120 MG 24 hr capsule Commonly known as: CARDIZEM  CD Take 1 capsule (120 mg total) by mouth daily.   furosemide  20 MG tablet Commonly known as: LASIX  Take 2 tablets by mouth once daily   glipiZIDE  5 MG tablet Commonly known as: GLUCOTROL  TAKE 1 TABLET BY MOUTH TWICE DAILY BEFORE A MEAL   glucose blood test strip Use as instructed   insulin  aspart 100 UNIT/ML injection Commonly known as: novoLOG  Inject 0-20 Units into the skin 3 (three) times daily with meals. Give while on steroid. CBG 70 - 120: 0 units CBG 121 - 150: 3 units CBG 151 - 200: 4 units CBG 201 - 250: 7 units CBG 251 - 300: 11 units CBG 301 - 350: 15 units CBG 351 - 400: 20 units   insulin   glargine-yfgn 100 UNIT/ML injection Commonly known as: SEMGLEE  Inject 0.1 mLs (10 Units total) into the skin daily.   Lancet Device Misc 1 Lancet by Does not apply route daily as needed.   lidocaine  5 % Commonly known as: LIDODERM  Place 3 patches onto the skin daily. To right knee and both ankles   meloxicam  7.5 MG tablet Commonly known as: MOBIC  Take 1 tablet by mouth once daily   multivitamin with minerals Tabs tablet Take 1 tablet by mouth daily.   OXYGEN  Inhale 2 L into the lungs continuous.   pravastatin  40 MG tablet Commonly known as: PRAVACHOL  Take 1 tablet (40 mg total) by mouth daily.   pregabalin  50 MG capsule Commonly known as: LYRICA  Take 1 capsule (50 mg total) by mouth 2 (two) times daily.   tacrolimus 0.1 % ointment Commonly known as: PROTOPIC Apply topically 2 (two) times daily. 2 weeks   tamsulosin  0.4 MG Caps capsule Commonly known as: FLOMAX  Take 1 capsule by mouth at bedtime   traMADol  50 MG  tablet Commonly known as: ULTRAM  Take 1 tablet (50 mg total) by mouth every 6 (six) hours as needed for moderate pain (pain score 4-6).   VITAMIN D  PO Take 1,000 Units by mouth daily.         Contact information for follow-up providers     [facility] - Gastroenterology .   Contact information: [address] [place], [place] [address]  [phone]        [facility]. Go on 03/26/2024.   Specialty: Cardiology Why: Hospital Follow-Up 03/26/24 @ 1:30 PM Please bring all medications to follow-up appt. [facility], [address], [employer] Parking at the door. Contact information: [address] [place]  [address] [phone]             Contact information for after-discharge care     Destination     [facility] .   Service: Skilled Nursing Contact information: [address] [place]  [phone]                     No Known Allergies   The results of significant diagnostics from this hospitalization (including imaging, microbiology, ancillary and laboratory) are listed below for reference.   Consultations:   Procedures/Studies: DG Chest Port 1 View Result Date: 03/17/2024 CLINICAL DATA:  Shortness of breath EXAM: PORTABLE CHEST 1 VIEW COMPARISON:  Chest x-ray 03/11/2024. FINDINGS: Normal cardiopericardial silhouette with calcified aorta. Persistent small left pleural effusion. No pneumothorax, right effusion or edema. No consolidation. Films are under penetrated. Degenerative changes of the spine. IMPRESSION: Stable left-sided small pleural effusion.  Chronic lung changes. Electronically Signed   By: Ranell Bring M.D.   On: 03/17/2024 13:41   MR LUMBAR SPINE WO CONTRAST Result Date: 03/17/2024 CLINICAL DATA:  73 year old male with low back pain.  Prior  surgery. EXAM: MRI LUMBAR SPINE WITHOUT CONTRAST TECHNIQUE: Multiplanar, multisequence MR imaging of the lumbar spine was performed. No intravenous contrast was administered. COMPARISON:  Lumbar radiographs yesterday.  Lumbar MRI 05/12/2019. FINDINGS: Segmentation: Normal on the comparison radiographs, the same numbering system used on the previous MRI. Alignment: Chronic mildly exaggerated lumbar lordosis is stable since 2020. No significant scoliosis or spondylolisthesis. Vertebrae: Normal background bone marrow signal. Bulky chronic widespread degenerative vertebral endplate spurring in the lower thoracic and the lumbar spine. No marrow edema or  evidence of acute osseous abnormality. Intact visible sacrum and SI joints. Conus medullaris and cauda equina: Conus extends to the L1 level. No lower spinal cord or conus signal abnormality. Paraspinal and other soft tissues: Infrarenal abdominal aortic aneurysm, diameter estimated at 3.9 cm on series 8, image 17. Unclear whether this has been previously repaired, but this does appear grossly stable from CTA abdomen 01/28/2018. Negative other visible abdominal viscera. Negative visualized posterior paraspinal soft tissues. Disc levels: T12-L1: Anterior disc osteophyte complex. Mild facet hypertrophy. No stenosis. L1-L2: Disc space loss with more circumferential disc osteophyte complex. No stenosis. L2-L3: Circumferential disc osteophyte complex. Asymmetric left subarticular component with superimposed mild to moderate facet, mild ligament flavum hypertrophy. Moderate spinal stenosis. Moderate to severe left lateral recess stenosis (descending left L3 nerve level), moderate left L2 foraminal stenosis. L3-L4: Disc space loss with bulky circumferential disc osteophyte complex asymmetric to the right. Up to moderate facet and ligament flavum hypertrophy. Moderate to severe spinal stenosis (series 8, image 20 and series 5, image 8). Severe left and moderate right L3 foraminal  stenosis. L4-L5: Disc space loss with bulky circumferential disc osteophyte complex asymmetric to the right. Moderate facet and ligament flavum hypertrophy. Moderate spinal stenosis, moderate to severe lateral recess stenosis greater on the right (L5 nerve levels). Mild left and severe right L4 neural foraminal stenosis (series 5, image 4 on the right). L5-S1: Posterior disc space loss. Bulky circumferential disc osteophyte complex. Mild to moderate facet and ligament flavum hypertrophy. No spinal stenosis, but moderate lateral recess stenosis on the left (left S1 nerve level). Severe left and mild right L5 foraminal stenosis (series 5, image 13 on the left). IMPRESSION: 1. Infrarenal Abdominal Aortic Aneurysm, diameter estimated at 3.9 cm, grossly stable from CTA Abdomen 01/28/2018. Recommend follow-up ultrasound every 3 years. (Ref.: J Vasc Surg. 2018; 67:2-77 and J Am Coll Radiol 2013;10(10):789-794.) 2. Widespread chronic lumbar spine bulky disc, endplate, and posterior element degeneration. No acute osseous abnormality. 3. 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 Moderate to severe lateral recess stenosis at the left L3, bilateral L4, right greater than left L5, and left S1 nerve levels. Severe neural foraminal stenosis at the left L3, right L4, and left L5 nerve levels. Electronically Signed   By: VEAR Hurst M.D.   On: 03/17/2024 12:54   DG Lumbar Spine 2-3 Views Result Date: 03/16/2024 CLINICAL DATA:  [id number] Other spondylosis with radiculopathy, lumbar region [id number] EXAM: LUMBAR SPINE - 2-3 VIEW COMPARISON:  Lumbar radiograph 04/28/2019 FINDINGS: Five non-rib-bearing lumbar vertebra. Minimal broad-based levo scoliotic curvature. No fracture compression deformity. Diffuse degenerative disc disease with disc space narrowing and spurring throughout, most prominently affecting L3-L4 and L4-L5. Moderate multilevel facet hypertrophy. IMPRESSION: 1. Diffuse degenerative  disc disease and facet hypertrophy. 2. Minimal broad-based levo scoliotic curvature. Electronically Signed   By: Andrea Gasman M.D.   On: 03/16/2024 20:06   DG Ankle Complete Left Result Date: 03/16/2024 CLINICAL DATA:  Ankle pain and weakness. EXAM: LEFT ANKLE COMPLETE - 3+ VIEW COMPARISON:  Left ankle radiographs 05/02/2018 FINDINGS: There is resection of the proximal aspect of the fibular diaphysis again extending off the superior plane of view. Moderate degenerative spurring at the distal aspect of the medial and lateral malleoli. There are again chronic ossicles just distal to the fibula. The ankle mortise is symmetric and intact. Moderate anterior and mild posterior distal tibial plafond degenerative osteophytosis. Mild plantar and posterior calcaneal heel spurs. Moderate to high-grade dorsal tarsometatarsal degenerative  osteophytes on lateral view, unchanged. There is again mineralization along the proximal aspect of the plantar fascia at the level of the anterior process of the calcaneus. No acute fracture or dislocation. IMPRESSION: 1. Remote resection of the proximal aspect of the fibular diaphysis. 2. Moderate medial and lateral ankle degenerative osteophytes. 3. Moderate to high-grade dorsal tarsometatarsal degenerative osteophytosis. 4. Mild plantar and posterior calcaneal heel spurs. Electronically Signed   By: Tanda Lyons M.D.   On: 03/16/2024 15:30   DG Ankle Complete Right Result Date: 03/16/2024 CLINICAL DATA:  Right ankle pain and weakness. EXAM: RIGHT ANKLE - COMPLETE 3+ VIEW COMPARISON:  None Available. FINDINGS: Densities in the patient's socks somewhat limit evaluation of fine bony detail. There are multiple chronic ossicles distal to the medial malleolus, the sequela of remote trauma. Mild plantar and posterior calcaneal heel spurs. Mild distal anterior tibial plafond greater than posterior tibial plafond degenerative osteophytosis. The ankle mortise is symmetric and intact. No acute  fracture or dislocation. IMPRESSION: 1. Mild plantar and posterior calcaneal heel spurs. 2. Mild distal anterior tibial plafond greater than posterior tibial plafond degenerative osteophytosis. 3. Multiple chronic ossicles distal to the medial malleolus, the sequela of remote trauma. Electronically Signed   By: Tanda Lyons M.D.   On: 03/16/2024 15:28   DG Knee Complete 4 Views Right Result Date: 03/15/2024 CLINICAL DATA:  Pain after fall EXAM: RIGHT KNEE - COMPLETE 4+ VIEW COMPARISON:  None Available. FINDINGS: No definitive fracture or malalignment. Chondrocalcinosis. Positive for knee effusion. Relatively patent joint spaces IMPRESSION: 1. No definitive acute osseous abnormality. Knee effusion. Cross-sectional imaging follow-up may be performed if high suspicion for acute osseous injury. Electronically Signed   By: Luke Bun M.D.   On: 03/15/2024 16:12   ECHOCARDIOGRAM COMPLETE Result Date: 03/12/2024    ECHOCARDIOGRAM REPORT   Patient Name:   Alfred Carney Date of Exam: 03/12/2024 Medical Rec #:  [id number]    Height:       69.0 in Accession #:    [id number]   Weight:       322.0 lb Date of Birth:  May 13, 1951     BSA:          2.529 m Patient Age:    73 years     BP:           102/85 mmHg Patient Gender: M            HR:           93 bpm. Exam Location:  [facility] Procedure: 2D Echo, Cardiac Doppler, Color Doppler and Intracardiac            Opacification Agent (Both Spectral and Color Flow Doppler were            utilized during procedure). Indications:     Dyspnea R06.00                  Atrial Fibrillation I48.91  History:         Patient has prior history of Echocardiogram examinations, most                  recent 06/10/2020. Signs/Symptoms:Shortness of Breath.  Sonographer:     Ashley McNeely-Sloane Referring Phys:  [id number] AMY N COX Diagnosing Phys: Deatrice Cage MD IMPRESSIONS  1. Left ventricular ejection fraction, by estimation, is 55 to 60%. The left ventricle has normal function. Left ventricular  endocardial border not optimally defined to evaluate regional wall motion. Left ventricular diastolic parameters are  consistent with Grade I diastolic dysfunction (impaired relaxation).  2. Right ventricular systolic function is normal. The right ventricular size is mildly enlarged. Tricuspid regurgitation signal is inadequate for assessing PA pressure.  3. The mitral valve was not well visualized. No evidence of mitral valve regurgitation. No evidence of mitral stenosis.  4. The aortic valve was not well visualized. Aortic valve regurgitation is not visualized. No aortic stenosis is present.  5. Challenging image quality. FINDINGS  Left Ventricle: Left ventricular ejection fraction, by estimation, is 55 to 60%. The left ventricle has normal function. Left ventricular endocardial border not optimally defined to evaluate regional wall motion. Definity  contrast agent was given IV to delineate the left ventricular endocardial borders. The left ventricular internal cavity size was normal in size. There is no left ventricular hypertrophy. Left ventricular diastolic parameters are consistent with Grade I diastolic dysfunction (impaired relaxation). Right Ventricle: The right ventricular size is mildly enlarged. No increase in right ventricular wall thickness. Right ventricular systolic function is normal. Tricuspid regurgitation signal is inadequate for assessing PA pressure. Left Atrium: Left atrial size was normal in size. Right Atrium: Right atrial size was normal in size. Pericardium: There is no evidence of pericardial effusion. Mitral Valve: The mitral valve was not well visualized. No evidence of mitral valve regurgitation. No evidence of mitral valve stenosis. MV peak gradient, 4.8 mmHg. The mean mitral valve gradient is 2.0 mmHg. Tricuspid Valve: The tricuspid valve is not well visualized. Tricuspid valve regurgitation is not demonstrated. No evidence of tricuspid stenosis. Aortic Valve: The aortic valve was not  well visualized. Aortic valve regurgitation is not visualized. No aortic stenosis is present. Aortic valve mean gradient measures 3.0 mmHg. Aortic valve peak gradient measures 5.7 mmHg. Aortic valve area, by VTI measures 2.25 cm. Pulmonic Valve: The pulmonic valve was not well visualized. Pulmonic valve regurgitation is not visualized. No evidence of pulmonic stenosis. Aorta: The aortic root is normal in size and structure. Venous: The inferior vena cava was not well visualized. IAS/Shunts: No atrial level shunt detected by color flow Doppler.  LEFT VENTRICLE PLAX 2D LVOT diam:     1.90 cm     Diastology LV SV:         45          LV e' medial:    4.03 cm/s LV SV Index:   18          LV E/e' medial:  14.0 LVOT Area:     2.84 cm    LV e' lateral:   8.38 cm/s                            LV E/e' lateral: 6.8  LV Volumes (MOD) LV vol d, MOD A2C: 55.7 ml LV vol d, MOD A4C: 51.6 ml LV vol s, MOD A2C: 35.7 ml LV vol s, MOD A4C: 28.5 ml LV SV MOD A2C:     20.0 ml LV SV MOD A4C:     51.6 ml LV SV MOD BP:      19.6 ml RIGHT VENTRICLE RV Basal diam:  4.50 cm RV S prime:     12.90 cm/s TAPSE (M-mode): 1.6 cm LEFT ATRIUM           Index       RIGHT ATRIUM           Index LA Vol (A4C): 22.8 ml 9.01 ml/m  RA Area:     15.30  cm                                   RA Volume:   39.80 ml  15.73 ml/m  AORTIC VALVE AV Area (Vmax):    2.25 cm AV Area (Vmean):   2.17 cm AV Area (VTI):     2.25 cm AV Vmax:           119.00 cm/s AV Vmean:          80.100 cm/s AV VTI:            0.199 m AV Peak Grad:      5.7 mmHg AV Mean Grad:      3.0 mmHg LVOT Vmax:         94.40 cm/s LVOT Vmean:        61.200 cm/s LVOT VTI:          0.158 m LVOT/AV VTI ratio: 0.79  AORTA Ao Root diam: 3.50 cm MITRAL VALVE MV Area (PHT): 3.31 cm     SHUNTS MV Area VTI:   2.01 cm     Systemic VTI:  0.16 m MV Peak grad:  4.8 mmHg     Systemic Diam: 1.90 cm MV Mean grad:  2.0 mmHg MV Vmax:       1.09 m/s MV Vmean:      72.2 cm/s MV Decel Time: 229 msec MV E velocity:  56.60 cm/s MV A velocity: 101.00 cm/s MV E/A ratio:  0.56 Deatrice Cage MD Electronically signed by Deatrice Cage MD Signature Date/Time: 03/12/2024/3:36:07 PM    Final    DG Chest 1 View Result Date: 03/11/2024 CLINICAL DATA:  sob EXAM: CHEST  1 VIEW COMPARISON:  November 19, 2017 FINDINGS: Biapical pleural thickening. Emphysema. Unchanged chronic blunting of the left costophrenic sulcus. No new airspace consolidation or pleural effusion. Mild cardiomegaly. Tortuous aorta with aortic atherosclerosis. No acute fracture or destructive lesions. Multilevel thoracic osteophytosis. IMPRESSION: Emphysema.  No acute cardiopulmonary abnormality. Electronically Signed   By: Rogelia Myers M.D.   On: 03/11/2024 08:37      Labs: BNP (last 3 results) Recent Labs    03/11/24 0818  BNP 116.1*   Basic Metabolic Panel: Recent Labs  Lab 03/19/24 0435 03/20/24 0310 03/21/24 0332 03/22/24 0402  NA 135 134* 135 134*  K 5.3* 4.8 5.5* 5.1  CL 93* 91* 93* 93*  CO2 31 33* 30 30  GLUCOSE 281* 319* 269* 209*  BUN 38* 47* 49* 54*  CREATININE 0.90 1.21 1.19 1.31*  CALCIUM  9.1 8.9 8.8* 9.0  MG 2.4  --   --   --    Liver Function Tests: No results for input(s): AST, ALT, ALKPHOS, BILITOT, PROT, ALBUMIN  in the last 168 hours. No results for input(s): LIPASE, AMYLASE in the last 168 hours. No results for input(s): AMMONIA in the last 168 hours. CBC: Recent Labs  Lab 03/19/24 0435 03/22/24 0402  WBC 18.0* 16.2*  HGB 13.4 15.5  HCT 42.2 47.4  MCV 89.8 88.3  PLT 619* 601*   Cardiac Enzymes: No results for input(s): CKTOTAL, CKMB, CKMBINDEX, TROPONINI in the last 168 hours. BNP: Invalid input(s): POCBNP CBG: Recent Labs  Lab 03/22/24 0737 03/22/24 1225 03/22/24 1623 03/22/24 2019 03/23/24 0740  GLUCAP 257* 148* 230* 167* 195*   D-Dimer No results for input(s): DDIMER in the last 72 hours. Hgb A1c Recent Labs    03/22/24 0402  HGBA1C  7.9*   Lipid  Profile No results for input(s): CHOL, HDL, LDLCALC, TRIG, CHOLHDL, LDLDIRECT in the last 72 hours. Thyroid  function studies No results for input(s): TSH, T4TOTAL, T3FREE, THYROIDAB in the last 72 hours.  Invalid input(s): FREET3 Anemia work up No results for input(s): VITAMINB12, FOLATE, FERRITIN, TIBC, IRON, RETICCTPCT in the last 72 hours. Urinalysis    Component Value Date/Time   COLORURINE YELLOW (A) 03/11/2024 1124   APPEARANCEUR CLEAR (A) 03/11/2024 1124   LABSPEC 1.018 03/11/2024 1124   PHURINE 5.0 03/11/2024 1124   GLUCOSEU NEGATIVE 03/11/2024 1124   HGBUR NEGATIVE 03/11/2024 1124   BILIRUBINUR NEGATIVE 03/11/2024 1124   KETONESUR NEGATIVE 03/11/2024 1124   PROTEINUR 30 (A) 03/11/2024 1124   UROBILINOGEN 1.0 05/05/2014 1706   NITRITE NEGATIVE 03/11/2024 1124   LEUKOCYTESUR NEGATIVE 03/11/2024 1124   Sepsis Labs Recent Labs  Lab 03/19/24 0435 03/22/24 0402  WBC 18.0* 16.2*   Microbiology No results found for this or any previous visit (from the past 240 hours).    Total time spend on discharging this patient, including the last patient exam, discussing the hospital stay, instructions for ongoing care as it relates to all pertinent caregivers, as well as preparing the medical discharge records, prescriptions, and/or referrals as applicable, is 35 minutes.    [name], MD  [facility] 03/23/2024, 10:35 AM

## 2024-03-18 NOTE — Consult Note (Signed)
 Consulting Department:  Internal medicine  Primary Physician:  Corwin Antu, FNP  Chief Complaint: Lower extremity pain  History of Present Illness: 03/17/2024 Korion Cuevas is a 73 y.o. male who presents with the chief complaint of progressive generalized weakness over the past 10 days, he has a history of CKD, COPD on oxygen , congestive heart failure.  He also has a history of gout with chronic ankle pain.  He had prior to admission was having worsening oxygen  requirements.  Was seen by cardiology and admitted for paroxysmal atrial tachycardia as well as diuresis for heart failure.  Prior to admission he did have a fall off of his lawnmower which resulted in right lower extremity pain numbness and tingling.  He was seen by physical therapy for evaluation and had generalized weakness throughout his bilateral upper and lower extremities.  He is being treated for urinary tract infection.  During admission has had worsening knee pain.  He was seen by orthopedic surgery.  He does have a history of a lumbar surgery, which was previously for a left-sided 3 4 foraminal disc.  We are consulted after his MRI showed diffuse spondylitic changes  The symptoms are causing a significant impact on the patient's life.   Review of Systems:  A 10 point review of systems is negative, except for the pertinent positives and negatives detailed in the HPI.  Past Medical History: Past Medical History:  Diagnosis Date   Chronic obstructive pulmonary disease (HCC) 2008   Moderate   Colon polyps    Community acquired pneumonia    Coronary artery disease    Degenerative joint disease    Diabetes mellitus without complication (HCC)    type 2   Emphysema lung (HCC)    Enlarged liver    fatty liver by '09 CT   Gastroesophageal reflux disease    Gout    H/O hiatal hernia    History of [place] spotted fever    Possible history of [place] Spotted Fever   Hyperlipidemia    Hypertension     Hypokalemia    diuretic induced, resolved   Microcytic anemia    iron pills   Morbid obesity (HCC)    Shortness of breath    Skin cancer    skin cancer lip removed   Sleep apnea    not currently using CPAP 05/20/13   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 LEVEL  10/23/2018       ESOPHAGOGASTRODUODENOSCOPY (EGD) WITH PROPOFOL  N/A 01/22/2018   Procedure: ESOPHAGOGASTRODUODENOSCOPY (EGD) WITH PROPOFOL ;  Surgeon: Shila Gustav GAILS, MD;  Location: [facility];  Service: Endoscopy;  Laterality: N/A;   HARDWARE REMOVAL Left 05/26/2013   Procedure: HARDWARE REMOVAL;  Surgeon: Jerona GAILS Sage, MD;  Location: [facility] OR;  Service: Orthopedics;  Laterality: Left;  Left Total Hip Arthroplasty, Removal of Deep Hardware   HIP SURGERY     Status post left hip surgery with bone grafting   IR INJECT/THERA/INC NEEDLE/CATH/PLC EPI/LUMB/SAC W/IMG  10/23/2018   LEFT HEART CATHETERIZATION WITH CORONARY ANGIOGRAM N/A 01/31/2014   Procedure: LEFT  HEART CATHETERIZATION WITH CORONARY ANGIOGRAM;  Surgeon: Lonni JONETTA Cash, MD;  Location: [facility] CATH LAB;  Service: Cardiovascular;  Laterality: N/A;   LUMBAR LAMINECTOMY/DECOMPRESSION MICRODISCECTOMY Left 10/28/2018   Procedure: Left Lumbar three-four Extraforaminal microdiscectomy;  Surgeon: Alix Charleston, MD;  Location: [facility] OR;  Service: Neurosurgery;  Laterality: Left;   THORACOABDOMINAL AORTIC ANEURYSM REPAIR     with right femoral and left iliac BPG and reimplantation of renal arteries.   TONSILLECTOMY     TONSILLECTOMY     TOTAL HIP  ARTHROPLASTY Left 05/26/2013   Procedure: TOTAL HIP ARTHROPLASTY;  Surgeon: Jerona LULLA Sage, MD;  Location: [facility] OR;  Service: Orthopedics;  Laterality: Left;  Left Total Hip Arthroplasty, Removal Deep Hardware    Allergies: Allergies as of 03/11/2024   (No Known Allergies)    Medications:  Current Facility-Administered Medications:    albuterol  (PROVENTIL ) (2.5 MG/3ML) 0.083% nebulizer solution 3 mL, 3 mL, Inhalation, Q4H PRN, Cox, Amy N, DO   allopurinol  (ZYLOPRIM ) tablet 200 mg, 200 mg, Oral, Daily, Cox, Amy N, DO, 200 mg at 03/17/24 0814   bisacodyl (DULCOLAX) EC tablet 5 mg, 5 mg, Oral, Daily PRN, Jesus America, NP   calcium  carbonate (TUMS - dosed in mg elemental calcium ) chewable tablet 200 mg of elemental calcium , 1 tablet, Oral, Daily PRN **AND** simethicone  (MYLICON) chewable tablet 80 mg, 80 mg, Oral, PRN, Hallaji, Sheema M, RPH   cholecalciferol  (VITAMIN D3) 25 MCG (1000 UNIT) tablet 1,000 Units, 1,000 Units, Oral, Daily, Cox, Amy N, DO, 1,000 Units at 03/17/24 0813   dexamethasone  (DECADRON ) tablet 4 mg, 4 mg, Oral, Q6H, 4 mg at 03/18/24 0526 **FOLLOWED BY** dexamethasone  (DECADRON ) tablet 3 mg, 3 mg, Oral, Q6H **FOLLOWED BY** [START ON 03/19/2024] dexamethasone  (DECADRON ) tablet 2 mg, 2 mg, Oral, Q6H **FOLLOWED BY** [START ON 03/20/2024] dexamethasone  (DECADRON ) tablet 1 mg, 1 mg, Oral, Q6H, Patel, Sona, MD   diltiazem (CARDIZEM CD) 24 hr capsule 120 mg, 120 mg, Oral, Daily, Dunn, Ryan M, PA-C, 120 mg at 03/17/24 0813   enoxaparin  (LOVENOX ) injection 60 mg, 60 mg, Subcutaneous, Q24H, Patel, Sona, MD, 60 mg at 03/17/24 2233   feeding supplement (ENSURE PLUS HIGH PROTEIN) liquid 237 mL, 237 mL, Oral, BID BM, Patel, Sona, MD, 237 mL at 03/17/24 1503   furosemide  (LASIX ) tablet 40 mg, 40 mg, Oral, Daily, Lorene Sinclair L, PA-C, 40 mg at 03/17/24 0813   insulin  aspart (novoLOG ) injection 0-15 Units, 0-15 Units, Subcutaneous, TID WC, Cox, Amy N, DO, 3 Units at 03/17/24 1728   insulin  aspart  (novoLOG ) injection 0-5 Units, 0-5 Units, Subcutaneous, QHS, Cox, Amy N, DO, 2 Units at 03/17/24 2242   irbesartan  (AVAPRO ) tablet 300 mg, 300 mg, Oral, Daily, Cox, Amy N, DO, 300 mg at 03/17/24 9185   lidocaine  (LIDODERM ) 5 % 1 patch, 1 patch, Transdermal, Q24H, Patel, Sona, MD, 1 patch at 03/17/24 0814   lidocaine  (LIDODERM ) 5 % 1 patch, 1 patch, Transdermal, Q24H, Patel, Sona, MD, 1 patch at 03/17/24 1502   metoprolol  tartrate (LOPRESSOR ) injection 5 mg, 5 mg, Intravenous, Q4H PRN, Cox, Amy N, DO, 5 mg at 03/11/24 2316   nitroGLYCERIN  (NITROSTAT ) SL tablet 0.4 mg, 0.4 mg, Sublingual, Q5 Min x 3 PRN, Cox, Amy N, DO   oxyCODONE  (Oxy IR/ROXICODONE ) immediate release tablet 5 mg, 5 mg, Oral, Q6H PRN, Patel, Sona, MD, 5 mg at 03/17/24 0813   pravastatin  (PRAVACHOL ) tablet 40 mg, 40 mg, Oral, q1800, Cox, Amy N, DO, 40 mg at 03/17/24 1728   pregabalin (LYRICA)  capsule 50 mg, 50 mg, Oral, BID, Patel, Sona, MD, 50 mg at 03/17/24 2233   tamsulosin  (FLOMAX ) capsule 0.4 mg, 0.4 mg, Oral, QHS, Cox, Amy N, DO, 0.4 mg at 03/17/24 2234   traMADol (ULTRAM) tablet 50 mg, 50 mg, Oral, Q6H PRN, Patel, Sona, MD, 50 mg at 03/17/24 2234   Social History: Social History   Tobacco Use   Smoking status: Former    Current packs/day: 0.00    Average packs/day: 1 pack/day for 35.0 years (35.0 ttl pk-yrs)    Types: Cigarettes    Start date: 09/17/1971    Quit date: 09/16/2006    Years since quitting: 17.5   Smokeless tobacco: Never  Vaping Use   Vaping status: Never Used  Substance Use Topics   Alcohol use: No    Alcohol/week: 0.0 standard drinks of alcohol   Drug use: No    Family Medical History: Family History  Problem Relation Age of Onset   Osteoarthritis Mother    Diabetes Mother    Pulmonary embolism Mother    Heart disease Father        Coronary Artery Disease   Stroke Father    Multiple sclerosis Sister    Factor V Leiden deficiency Sister    Emphysema Sister    Hyperlipidemia Brother     Heart attack Maternal Grandmother    Lung cancer Maternal Grandfather        smoked    Physical Examination: Vitals:   03/17/24 2309 03/18/24 0517  BP: (!) 151/72 (!) 142/68  Pulse: 73 67  Resp: 18 18  Temp: 98.3 F (36.8 C) 98.2 F (36.8 C)  SpO2: 98% 96%     General: Patient is well developed, well nourished, calm, collected, and in no apparent distress.  NEUROLOGICAL:  General: Patient appears overall uncomfortable and is labored in his breathing. Awake, alert, oriented to person, place, and time.  Pupils equal round and reactive to light.   He denies any back pain to palpation..    Strength: Side Biceps Triceps Deltoid Interossei Grip Wrist Ext. Wrist Flex.  R 5 5 5 5 5 5 5   L 5 5 5 5 5 5 5    Side Iliopsoas Quads Hamstring PF DF EHL  R 5 5 5 5 5 5   L 5 5 5 5 5 5    Focused sensory exam is difficult to ascertain, but patient does state that he is able to feel light touch throughout his entire right lower extremity, but he does say that nondermatomal locations feel different  His reflexes are diffusely hyporeflexic in the bilateral lower extremities, very difficult to elicit bilaterally, slightly complicated due to patient's body habitus.  He has severe tenderness to palpation around the right knee and ankle.  As far as his motor exam goes this is also difficult to get formal grading, however he does not appear to be antigravity in either of his lower extremities, but the right appears weaker than the left.  He is able to activate his hip flexors knee extensors knee flexors dorsiflexion and plantarflexion and toe activators on the right, however excursion is limited   Imaging: DG Chest Port 1 View Result Date: 03/17/2024 CLINICAL DATA:  Shortness of breath EXAM: PORTABLE CHEST 1 VIEW COMPARISON:  Chest x-ray 03/11/2024. FINDINGS: Normal cardiopericardial silhouette with calcified aorta. Persistent small left pleural effusion. No pneumothorax, right effusion or edema. No  consolidation. Films are under penetrated. Degenerative changes of the spine. IMPRESSION: Stable left-sided small pleural effusion.  Chronic lung changes. Electronically Signed   By: Ranell Bring M.D.   On: 03/17/2024 13:41   MR LUMBAR SPINE WO CONTRAST Result Date: 03/17/2024 CLINICAL DATA:  73 year old male with low back pain.  Prior surgery. EXAM: MRI LUMBAR SPINE WITHOUT CONTRAST TECHNIQUE: Multiplanar, multisequence MR imaging of the lumbar spine was performed. No intravenous contrast was administered. COMPARISON:  Lumbar radiographs yesterday.  Lumbar MRI 05/12/2019. FINDINGS: Segmentation: Normal on the comparison radiographs, the same numbering system used on the previous MRI. Alignment: Chronic mildly exaggerated lumbar lordosis is stable since 2020. No significant scoliosis or spondylolisthesis. Vertebrae: Normal background bone marrow signal. Bulky chronic widespread degenerative vertebral endplate spurring in the lower thoracic and the lumbar spine. No marrow edema or evidence of acute osseous abnormality. Intact visible sacrum and SI joints. Conus medullaris and cauda equina: Conus extends to the L1 level. No lower spinal cord or conus signal abnormality. Paraspinal and other soft tissues: Infrarenal abdominal aortic aneurysm, diameter estimated at 3.9 cm on series 8, image 17. Unclear whether this has been previously repaired, but this does appear grossly stable from CTA abdomen 01/28/2018. Negative other visible abdominal viscera. Negative visualized posterior paraspinal soft tissues. Disc levels: T12-L1: Anterior disc osteophyte complex. Mild facet hypertrophy. No stenosis. L1-L2: Disc space loss with more circumferential disc osteophyte complex. No stenosis. L2-L3: Circumferential disc osteophyte complex. Asymmetric left subarticular component with superimposed mild to moderate facet, mild ligament flavum hypertrophy. Moderate spinal stenosis. Moderate to severe left lateral recess stenosis  (descending left L3 nerve level), moderate left L2 foraminal stenosis. L3-L4: Disc space loss with bulky circumferential disc osteophyte complex asymmetric to the right. Up to moderate facet and ligament flavum hypertrophy. Moderate to severe spinal stenosis (series 8, image 20 and series 5, image 8). Severe left and moderate right L3 foraminal stenosis. L4-L5: Disc space loss with bulky circumferential disc osteophyte complex asymmetric to the right. Moderate facet and ligament flavum hypertrophy. Moderate spinal stenosis, moderate to severe lateral recess stenosis greater on the right (L5 nerve levels). Mild left and severe right L4 neural foraminal stenosis (series 5, image 4 on the right). L5-S1: Posterior disc space loss. Bulky circumferential disc osteophyte complex. Mild to moderate facet and ligament flavum hypertrophy. No spinal stenosis, but moderate lateral recess stenosis on the left (left S1 nerve level). Severe left and mild right L5 foraminal stenosis (series 5, image 13 on the left). IMPRESSION: 1. Infrarenal Abdominal Aortic Aneurysm, diameter estimated at 3.9 cm, grossly stable from CTA Abdomen 01/28/2018. Recommend follow-up ultrasound every 3 years. (Ref.: J Vasc Surg. 2018; 67:2-77 and J Am Coll Radiol 2013;10(10):789-794.) 2. Widespread chronic lumbar spine bulky disc, endplate, and posterior element degeneration. No acute osseous abnormality. 3. 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 Moderate to severe lateral recess stenosis at the left L3, bilateral L4, right greater than left L5, and left S1 nerve levels. Severe neural foraminal stenosis at the left L3, right L4, and left L5 nerve levels. Electronically Signed   By: VEAR Hurst M.D.   On: 03/17/2024 12:54   DG Lumbar Spine 2-3 Views Result Date: 03/16/2024 CLINICAL DATA:  [id number] Other spondylosis with radiculopathy, lumbar region [id number] EXAM: LUMBAR SPINE - 2-3 VIEW COMPARISON:  Lumbar  radiograph 04/28/2019 FINDINGS: Five non-rib-bearing lumbar vertebra. Minimal broad-based levo scoliotic curvature. No fracture compression deformity. Diffuse degenerative disc disease with disc space narrowing and spurring throughout, most prominently affecting L3-L4 and L4-L5. Moderate multilevel facet hypertrophy. IMPRESSION: 1. Diffuse degenerative disc  disease and facet hypertrophy. 2. Minimal broad-based levo scoliotic curvature. Electronically Signed   By: Andrea Gasman M.D.   On: 03/16/2024 20:06   DG Ankle Complete Left Result Date: 03/16/2024 CLINICAL DATA:  Ankle pain and weakness. EXAM: LEFT ANKLE COMPLETE - 3+ VIEW COMPARISON:  Left ankle radiographs 05/02/2018 FINDINGS: There is resection of the proximal aspect of the fibular diaphysis again extending off the superior plane of view. Moderate degenerative spurring at the distal aspect of the medial and lateral malleoli. There are again chronic ossicles just distal to the fibula. The ankle mortise is symmetric and intact. Moderate anterior and mild posterior distal tibial plafond degenerative osteophytosis. Mild plantar and posterior calcaneal heel spurs. Moderate to high-grade dorsal tarsometatarsal degenerative osteophytes on lateral view, unchanged. There is again mineralization along the proximal aspect of the plantar fascia at the level of the anterior process of the calcaneus. No acute fracture or dislocation. IMPRESSION: 1. Remote resection of the proximal aspect of the fibular diaphysis. 2. Moderate medial and lateral ankle degenerative osteophytes. 3. Moderate to high-grade dorsal tarsometatarsal degenerative osteophytosis. 4. Mild plantar and posterior calcaneal heel spurs. Electronically Signed   By: Tanda Lyons M.D.   On: 03/16/2024 15:30   DG Ankle Complete Right Result Date: 03/16/2024 CLINICAL DATA:  Right ankle pain and weakness. EXAM: RIGHT ANKLE - COMPLETE 3+ VIEW COMPARISON:  None Available. FINDINGS: Densities in the  patient's socks somewhat limit evaluation of fine bony detail. There are multiple chronic ossicles distal to the medial malleolus, the sequela of remote trauma. Mild plantar and posterior calcaneal heel spurs. Mild distal anterior tibial plafond greater than posterior tibial plafond degenerative osteophytosis. The ankle mortise is symmetric and intact. No acute fracture or dislocation. IMPRESSION: 1. Mild plantar and posterior calcaneal heel spurs. 2. Mild distal anterior tibial plafond greater than posterior tibial plafond degenerative osteophytosis. 3. Multiple chronic ossicles distal to the medial malleolus, the sequela of remote trauma. Electronically Signed   By: Tanda Lyons M.D.   On: 03/16/2024 15:28     I have personally reviewed the images and agree with the above interpretation.  Labs:    Latest Ref Rng & Units 03/12/2024    5:36 AM 03/11/2024    8:18 AM 11/18/2023   12:16 PM  CBC  WBC 4.0 - 10.5 K/uL 16.4  18.7  7.1   Hemoglobin 13.0 - 17.0 g/dL 87.1  86.3  85.2   Hematocrit 39.0 - 52.0 % 39.9  42.9  44.7   Platelets 150 - 400 K/uL 356  357  200.0       Latest Ref Rng & Units 03/16/2024    3:28 AM 03/13/2024    8:36 AM 03/12/2024    5:36 AM  BMP  Glucose 70 - 99 mg/dL 808  831  834   BUN 8 - 23 mg/dL 30  23  23    Creatinine 0.61 - 1.24 mg/dL 8.97  8.71  8.66   Sodium 135 - 145 mmol/L 141  138  140   Potassium 3.5 - 5.1 mmol/L 4.5  3.5  3.6   Chloride 98 - 111 mmol/L 96  94  96   CO2 22 - 32 mmol/L 34  33  32   Calcium  8.9 - 10.3 mg/dL 8.9  8.4  8.4       MRI:    Assessment and Plan: Mr. Greenstreet is a pleasant 73 y.o. male with recent admission for generalized weakness, UTI, congestive heart failure, and overall deconditioning.  During  his admission began complaining of worsened right lower extremity pain especially around the knee and ankle.  He states that he did have a fall prior to admission where he stepped off of his lawnmower and lost his balance.  Exam is difficult  as he has nondermatomal sensory changes at are not consistent.  He does however have decreased reflexes throughout his entire right lower extremity.  On physical examination from a motor standpoint he has difficulty activating physical his hip flexors, knee extensors knee flexors dorsiflexion plantarflexion hip abduction and adduction.  Appears to be diffusely weak throughout his entire right lower extremity, comparison of his left lower extremity shows slightly improved strength on the left compared to the right but overall still decreased compared to his reported baseline.    He has anterior osteophyte formation from his T12 level down to the S1 level.  Posteriorly he has disc height loss most prominent at 34, 45 and 5 1.  He has disc osteophyte complexes noted at 5145 and 3 4 bilaterally.  He has significant foraminal stenosis on the left at L5-S1 moderate at L3-4 with a severe foraminal stenosis on the right at L4-5 however is upper nerve roots though adjacent to spondylosis seem to have plenty of room.   His major significant area of compression on the right is the L4-5 foramen which would correlate with the possible L4 radiculopathy, however his major complaint is pain about the knee.  This generally localizes to L3.  On physical exam he has some dysesthesias throughout but pain to palpation at the knee and ankle.  If this were to be neurologic given his weakness pattern it seems to be more of a plexus issue, given his diabetes history could consider diabetic amyotrophy as he is having significant pain.  Also if neurologic could be secondary to his spondylosis and recent fall which can sometimes worsen radiculitis.  We discussed focal injections starting with the most likely and decreasing from there, however I did explain to him given the diffuse nature of his symptoms and no clear localization that I am unsure if a single level injection would give him much relief and I would like to have to try at other  levels as well.  Alternatively we could try systemic steroids to see how he responds.  Although this would help with musculoskeletal issues as well but could help with symptom management.  From a neurologic standpoint a EMG/nerve conduction study would be helpful, but however with his body habitus and level of edema currently it is unlikely that they could get a useful examination.  At this time I do not see clear indication for neurosurgical operative procedure.  He can follow in clinic as needed.  Penne MICAEL Sharps, MD/MSCR Dept. of Neurosurgery   Spent a total of 45 minutes on his care today, including chart evaluation, face-to-face evaluation, patient counseling, discussion with providers, and documentation.

## 2024-03-18 NOTE — TOC Progression Note (Addendum)
 Transition of Care [facility]) - Progression Note    Patient Details  Name: Alfred Carney MRN: [id number] Date of Birth: [birth year]  [facility] CM/SW Contact  Tomasa JAYSON Childes, RN Phone Number: 03/18/2024, 2:15 PM  Clinical Narrative:    Per MD patient is medically stable to discharge today.  Attempt to contact Therisa at [facility]. No answer left a message regarding possible discharge home today.   2:10  Retrieved call from [place]. She will review bed to see if patient can admit today.   2:27pm Per Therisa at [facility], patient can admit.  MD made aware.  Patient PASRR obtained [id number] A Facility provided with PASRR  3:11 Discharge summary and order received.  Spoke with patient regarding the discharge  plan.  Patient stated he is not in agreement with discharge today. He stated he had spoken with the MD however did not feel discharge appropriate. Patient stated he did not have the number to call for appeals. He was provided the number for [facility] @ [phone]. Patient stated he would call [facility] now. MD informed patient requested to speak with him.    Expected Discharge Plan: Skilled Nursing Facility    Expected Discharge Plan and Services                           DME Agency: [employer]                   Social Determinants of Health (SDOH) Interventions SDOH Screenings   Food Insecurity: No Food Insecurity (03/16/2024)  Housing: Unknown (03/16/2024)  Transportation Needs: No Transportation Needs (03/16/2024)  Utilities: Not At Risk (03/12/2024)  Alcohol Screen: Low Risk  (05/09/2023)  Depression (PHQ2-9): Medium Risk (11/18/2023)  Financial Resource Strain: Low Risk  (03/16/2024)  Physical Activity: Insufficiently Active (05/27/2023)  Social Connections: Moderately Integrated (03/12/2024)  Stress: No Stress Concern Present (05/27/2023)  Tobacco Use: Medium Risk (03/16/2024)  Health Literacy: Adequate Health Literacy (05/12/2023)     Readmission Risk Interventions     No data to display

## 2024-03-18 NOTE — Progress Notes (Signed)
 PROGRESS NOTE    Alfred Carney  FMW:[id number] DOB: 12-25-1950 DOA: 03/11/2024 PCP: Corwin Antu, FNP  245A/245A-AA  LOS: 7 days   Brief hospital course:   Assessment & Plan: Alfred Carney is a 73 year old male with history of non-insulin -dependent diabetes mellitus, PAD, hypertension, history of gout, COPD on chronic 2 L nasal cannula at baseline, history of heart failure preserved ejection fraction, who presented to emergency department for chief concerns of weakness for 10 days.    Paroxysmal atrial tachycardia Acute dCHF -- initial EKG showed atrial fibrillation however rhythm strip evaluated by cardiology showed PACs with sinus tachycardia.  [facility] cardiology input noted, favored secondary to untreated sleep apnea/obesity hypoventilation syndrome.  Echo EF55-60%. -- ordered Cardizem 30 mg Q8  --transition to cardizem 120 mg daily --cont oral lasix  40 mg daily   Leukocytosis possible UTI -- patient not able to give any symptoms. However has abnormal UA and with weakness so treated with three doses of Rocephin . -- Urine culture negative   Chronic hypoxemic respiratory failure with chronic emphysema and untreated sleep apnea -- patient wears 2 L chronically oxygen  --Patient started using CPAP at nighttime. Tolerating it well.  --CPAP nightly   Generalized weakness deconditioning/recent fall --  PT/OT--rehab. Pt very deconditioned   Type II diabetes with hyperglycemia --resume home glipizide  after discharge. 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 No acute osseous abnormality. 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  Moderate to severe lateral recess stenosis at the left L3, bilateral L4, right greater than left L5, and left S1 nerve levels.  Severe neural foraminal stenosis at the left L3,  right L4, and left L5 nerve levels. -- Neurosurgery consultation with Dr. Claudene-- he recommended Decadron  with taper.  Per Dr. Claudene, no isolated, localized radiculopathy and more like a plexopathy or multilevel radiculitis, and therefore no great benefit for inpatient IR steroid injection. --cont oral decadron  with taper --cont Lyrica (new)   Bilateral ankle, right knee pain  Possible gout flare -- patient said he has history of gout, and pain felt like gout flare.  Already on allopurinol  --Ortho consult with Dr Edie -- x-ray right knee--knee effusion.  --cont steroid as above --PRN oxycodone  and lidocaine  patches help.      DVT prophylaxis: Lovenox  SQ Code Status: Full code  Family Communication: wife and son updated at bedside today Level of care: Med-Surg Dispo:   The patient is from: home  Anticipated d/c is to: SNF rehab Anticipated d/c date is: whenever SNF can accept   Subjective and Interval History:  Pt reported right knee pain and ankle pain improved with PRN pain med and lidocaine  patch.   Objective: Vitals:   03/18/24 0748 03/18/24 1218 03/18/24 1553 03/18/24 2021  BP: (!) 146/62 (!) 111/52 (!) 145/45 (!) 169/54  Pulse: 68 75 73 72  Resp:      Temp: 98.1 F (36.7 C) 98 F (36.7 C) 97.7 F (36.5 C) 97.8 F (36.6 C)  TempSrc:      SpO2: 97% 96% 97% 95%  Weight:      Height:        Intake/Output Summary (Last 24 hours) at 03/18/2024 2045 Last data filed at 03/18/2024 1945 Gross per 24 hour  Intake 720 ml  Output 1750 ml  Net -1030 ml   Filed Weights   03/16/24 0500  03/17/24 0515 03/18/24 0500  Weight: 128.9 kg 128.6 kg 131.2 kg    Examination:   Constitutional: NAD, AAOx3 HEENT: conjunctivae and lids normal, EOMI CV: No cyanosis.   RESP: normal respiratory effort Extremities: edema in BLE SKIN: warm, dry Neuro: II - XII grossly intact.     Data Reviewed: I have personally reviewed labs and imaging studies  Time spent: 50 minutes  [name], MD [facility] If 7PM-7AM, please contact night-coverage 03/18/2024, 8:45 PM

## 2024-03-19 DIAGNOSIS — I5033 Acute on chronic diastolic (congestive) heart failure: Secondary | ICD-10-CM | POA: Diagnosis not present

## 2024-03-19 LAB — MAGNESIUM: Magnesium: 2.4 mg/dL (ref 1.7–2.4)

## 2024-03-19 LAB — BASIC METABOLIC PANEL WITH GFR
Anion gap: 11 (ref 5–15)
BUN: 38 mg/dL — ABNORMAL HIGH (ref 8–23)
CO2: 31 mmol/L (ref 22–32)
Calcium: 9.1 mg/dL (ref 8.9–10.3)
Chloride: 93 mmol/L — ABNORMAL LOW (ref 98–111)
Creatinine, Ser: 0.9 mg/dL (ref 0.61–1.24)
GFR, Estimated: >60 mL/min (ref >60)
Glucose, Bld: 281 mg/dL — ABNORMAL HIGH (ref 70–99)
Potassium: 5.3 mmol/L — ABNORMAL HIGH (ref 3.5–5.1)
Sodium: 135 mmol/L (ref 135–145)

## 2024-03-19 LAB — CBC
HCT: 42.2 % (ref 39.0–52.0)
Hemoglobin: 13.4 g/dL (ref 13.0–17.0)
MCH: 28.5 pg (ref 26.0–34.0)
MCHC: 31.8 g/dL (ref 30.0–36.0)
MCV: 89.8 fL (ref 80.0–100.0)
Platelets: 619 K/uL — ABNORMAL HIGH (ref 150–400)
RBC: 4.7 MIL/uL (ref 4.22–5.81)
RDW: 14.1 % (ref 11.5–15.5)
WBC: 18 K/uL — ABNORMAL HIGH (ref 4.0–10.5)
nRBC: 0 % (ref 0.0–0.2)

## 2024-03-19 LAB — GLUCOSE, CAPILLARY
Glucose-Capillary: 265 mg/dL — ABNORMAL HIGH (ref 70–99)
Glucose-Capillary: 427 mg/dL — ABNORMAL HIGH (ref 70–99)
Glucose-Capillary: 282 mg/dL — ABNORMAL HIGH (ref 70–99)
Glucose-Capillary: 309 mg/dL — ABNORMAL HIGH (ref 70–99)

## 2024-03-19 MED ORDER — INSULIN ASPART 100 UNIT/ML IJ SOLN
0.0000 [IU] | Freq: Three times a day (TID) | INTRAMUSCULAR | Status: DC
  Administered 2024-03-19: 20 [IU] via SUBCUTANEOUS
  Administered 2024-03-19: 11 [IU] via SUBCUTANEOUS
  Administered 2024-03-20: 15 [IU] via SUBCUTANEOUS
  Administered 2024-03-20: 7 [IU] via SUBCUTANEOUS
  Administered 2024-03-20: 11 [IU] via SUBCUTANEOUS
  Administered 2024-03-21: 7 [IU] via SUBCUTANEOUS
  Administered 2024-03-21: 11 [IU] via SUBCUTANEOUS
  Administered 2024-03-21: 7 [IU] via SUBCUTANEOUS
  Administered 2024-03-22: 11 [IU] via SUBCUTANEOUS
  Administered 2024-03-22: 7 [IU] via SUBCUTANEOUS
  Administered 2024-03-22: 3 [IU] via SUBCUTANEOUS
  Administered 2024-03-23: 4 [IU] via SUBCUTANEOUS
  Filled 2024-03-19 (×12): qty 1

## 2024-03-19 MED ORDER — ADULT MULTIVITAMIN W/MINERALS CH
1.0000 | ORAL_TABLET | Freq: Every day | ORAL | Status: DC
  Administered 2024-03-20 – 2024-03-23 (×4): 1 via ORAL
  Filled 2024-03-19 (×4): qty 1

## 2024-03-19 MED ORDER — ADULT MULTIVITAMIN W/MINERALS CH
1.0000 | ORAL_TABLET | Freq: Every day | ORAL | Status: DC
  Administered 2024-03-19: 1
  Filled 2024-03-19: qty 1

## 2024-03-19 MED ORDER — FUROSEMIDE 10 MG/ML IJ SOLN
40.0000 mg | Freq: Two times a day (BID) | INTRAMUSCULAR | Status: DC
  Administered 2024-03-19 – 2024-03-20 (×3): 40 mg via INTRAVENOUS
  Filled 2024-03-19 (×3): qty 4

## 2024-03-19 NOTE — Plan of Care (Signed)

## 2024-03-19 NOTE — Progress Notes (Signed)
 PROGRESS NOTE    Alfred Carney  FMW:[id number] DOB: 02-22-1951 DOA: 03/11/2024 PCP: Corwin Antu, FNP  101A/101A-AA  LOS: 8 days   Brief hospital course:   Assessment & Plan: Alfred Carney is a 73 year old male with history of non-insulin -dependent diabetes mellitus, PAD, hypertension, history of gout, COPD on chronic 2 L nasal cannula at baseline, history of heart failure preserved ejection fraction, who presented to emergency department for chief concerns of weakness for 10 days.    Paroxysmal atrial tachycardia Acute dCHF -- initial EKG showed atrial fibrillation however rhythm strip evaluated by cardiology showed PACs with sinus tachycardia.  [name] cardiology input noted, favored secondary to untreated sleep apnea/obesity hypoventilation syndrome.  Echo EF55-60%. -- ordered Cardizem  30 mg Q8 then transitioned to cardizem  120 mg daily --cont cardizem  120 mg daily --switch to IV lasix  40 BID to help with LE swelling   Leukocytosis possible UTI -- patient not able to give any symptoms. However has abnormal UA and with weakness so treated with three doses of Rocephin . 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 Tolerating it well.  --CPAP nightly   Generalized weakness deconditioning/recent fall --  PT/OT--rehab. Pt very deconditioned   Type II diabetes  Hyperglycemia due to steroid use --resume home glipizide  after discharge. 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 No acute osseous abnormality. 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  Moderate to severe lateral recess stenosis at the left L3, bilateral L4, right greater than left L5, and  left S1 nerve levels.  Severe neural foraminal stenosis at the left L3, right L4, and left L5 nerve levels. -- Neurosurgery consultation with Dr. Claudene-- he recommended Decadron  with taper.  Per Dr. Claudene, no isolated, localized radiculopathy and more like a plexopathy or multilevel radiculitis, and therefore no great benefit for inpatient IR steroid injection. --cont oral decadron  with taper --cont Lyrica  (new)   Bilateral ankle, right knee pain  Possible gout flare -- patient said he has history of gout, and pain felt like gout flare.  Already on allopurinol  --Ortho consult with Dr Edie -- x-ray right knee--knee effusion.  --cont steroid as above --PRN oxycodone  and lidocaine  patches help.    Hypokalemia --mild --IV lasix  should decrease    DVT prophylaxis: Lovenox  SQ Code Status: Full code  Family Communication:  Level of care: Med-Surg Dispo:   The patient is from: home  Anticipated d/c is to: SNF rehab Anticipated d/c date is: whenever SNF can accept   Subjective and Interval History:  Pt reported ankle pain much improved.   Objective: Vitals:   03/19/24 0819 03/19/24 1124 03/19/24 1720 03/19/24 1720  BP: (!) 145/75 (!) 136/55  (!) 134/59  Pulse: 63 68  64  Resp: 16 18  17   Temp: (!) 97.4 F (36.3 C) 97.7 F (36.5 C)  98.2 F (36.8 C)  TempSrc: Oral     SpO2: 99% 94%  94%  Weight:   120.2 kg   Height:   5' 9.5 (1.765 m)     Intake/Output Summary (Last 24 hours) at 03/19/2024 1735 Last data filed at 03/19/2024 1424 Gross per 24 hour  Intake 1160 ml  Output 2400 ml  Net -1240 ml   Alfred Carney  Weights   03/18/24 0500 03/19/24 0437 03/19/24 1720  Weight: 131.2 kg 131.2 kg 120.2 kg    Examination:   Constitutional: NAD, AAOx3 HEENT: conjunctivae and lids normal, EOMI CV: No cyanosis.   RESP: normal respiratory effort Extremities: edema in BLE Neuro: II - XII grossly intact.   Psych: Normal mood and affect.  Appropriate judgement and reason   Data  Reviewed: I have personally reviewed labs and imaging studies  Time spent: 35 minutes  Alfred Haber, MD [facility] If 7PM-7AM, please contact night-coverage 03/19/2024, 5:35 PM

## 2024-03-19 NOTE — Progress Notes (Signed)
 Initial Nutrition Assessment  DOCUMENTATION CODES:   Morbid obesity  INTERVENTION:   -Continue 2 gram sodium diet -MVI with minerals daily -D/c Ensure Plus High Protein po BID, each supplement provides 350 kcal and 20 grams of protein  -RD provided Low Sodium Nutrition Therapy handout from [facility] Nutrition Care Manual; attached to AVS/ discharge summary  NUTRITION DIAGNOSIS:   Increased nutrient needs related to chronic illness as evidenced by estimated needs.  GOAL:   Patient will meet greater than or equal to 90% of their needs  MONITOR:   PO intake  REASON FOR ASSESSMENT:   Malnutrition Screening Tool    ASSESSMENT:   Pt with history of non-insulin -dependent diabetes mellitus, PAD, hyperlipidemia, hypertension, history of gout, BPH, COPD on chronic 2 L nasal cannula at baseline, history of heart failure preserved ejection fraction, who presents for chief concerns of weakness for 10 days PTA.  Pt admitted with new onset CHF.   Reviewed I/O's: -580 ml x 24 hours and -11.6 L since admission  UOP: 1.5 L x 24 hours  Pt unavailable at time of visit. Attempted to speak with pt via call to hospital room phone, however, unable to reach. RD unable to obtain further nutrition-related history or complete nutrition-focused physical exam at this time.    Per H&P, pt past 2 weeks PTA, pt with weakness, fatigue, and muscle aches.   Pt currently on a 2 gram sodium diet. Noted meal completions 100%.   Reviewed wt hx; pt has experienced a 10.3% wt loss over the past 4 months, which is significant for time frame. Per nursing assessment, pt with moderate edema, which may be masking true weight loss as well as fat and muscle depletions. Also suspect some wt loss may be related to diuresis; pt -11.6 L since admission.   Per orthopedics notes, pt with right greater than left lower extremity pain of unclear etiology; recommending radiograph of spine.   Per TOC notes, pt is medically  stable for discharge to SNF [facility]). Pt appealing discharge.   Medications reviewed and include vitamin D3, decadron , loveox, and lasix .   Lab Results  Component Value Date   HGBA1C 7.0 (H) 11/18/2023   PTA DM medications are 5 mg glipizide  BID.   Labs reviewed: K: 5.3, CBGS: 197-327 (inpatient orders for glycemic control are 0-15 units insulin  aspart TID with meals and 5 units insulin  aspart daily at bedtime).    Diet Order:   Diet Order             Diet 2 gram sodium Fluid consistency: Thin  Diet effective now                   EDUCATION NEEDS:   No education needs have been identified at this time  Skin:  Skin Assessment: Reviewed RN Assessment  Last BM:  03/18/23 (type 4)  Height:   Ht Readings from Last 1 Encounters:  03/12/24 5' 9.5 (1.765 m)    Weight:   Wt Readings from Last 1 Encounters:  03/19/24 131.2 kg    Ideal Body Weight:  74.1 kg  BMI:  Body mass index is 42.1 kg/m.  Estimated Nutritional Needs:   Kcal:  1850-2050  Protein:  100-115 grams  Fluid:  1.8-2.0 L    Margery ORN, RD, LDN, CDCES Registered Dietitian III Certified Diabetes Care and Education Specialist If unable to reach this RD, please use RD Inpatient group chat on secure chat between hours of 8am-4 pm daily

## 2024-03-20 DIAGNOSIS — I5033 Acute on chronic diastolic (congestive) heart failure: Secondary | ICD-10-CM | POA: Diagnosis not present

## 2024-03-20 LAB — GLUCOSE, CAPILLARY
Glucose-Capillary: 274 mg/dL — ABNORMAL HIGH (ref 70–99)
Glucose-Capillary: 312 mg/dL — ABNORMAL HIGH (ref 70–99)
Glucose-Capillary: 247 mg/dL — ABNORMAL HIGH (ref 70–99)
Glucose-Capillary: 260 mg/dL — ABNORMAL HIGH (ref 70–99)

## 2024-03-20 LAB — BASIC METABOLIC PANEL WITH GFR
Anion gap: 10 (ref 5–15)
BUN: 47 mg/dL — ABNORMAL HIGH (ref 8–23)
CO2: 33 mmol/L — ABNORMAL HIGH (ref 22–32)
Calcium: 8.9 mg/dL (ref 8.9–10.3)
Chloride: 91 mmol/L — ABNORMAL LOW (ref 98–111)
Creatinine, Ser: 1.21 mg/dL (ref 0.61–1.24)
GFR, Estimated: >60 mL/min (ref >60)
Glucose, Bld: 319 mg/dL — ABNORMAL HIGH (ref 70–99)
Potassium: 4.8 mmol/L (ref 3.5–5.1)
Sodium: 134 mmol/L — ABNORMAL LOW (ref 135–145)

## 2024-03-20 MED ORDER — FUROSEMIDE 10 MG/ML IJ SOLN
40.0000 mg | Freq: Every day | INTRAMUSCULAR | Status: DC
  Administered 2024-03-21 – 2024-03-22 (×2): 40 mg via INTRAVENOUS
  Filled 2024-03-20 (×2): qty 4

## 2024-03-20 MED ORDER — INSULIN ASPART 100 UNIT/ML IJ SOLN
5.0000 [IU] | Freq: Three times a day (TID) | INTRAMUSCULAR | Status: DC
  Administered 2024-03-20 – 2024-03-21 (×4): 5 [IU] via SUBCUTANEOUS
  Filled 2024-03-20 (×4): qty 1

## 2024-03-20 NOTE — Plan of Care (Signed)

## 2024-03-20 NOTE — Progress Notes (Signed)
 PROGRESS NOTE    Dayton Kenley  FMW:[id number] DOB: 03-03-1951 DOA: 03/11/2024 PCP: Corwin Antu, FNP  101A/101A-AA  LOS: 9 days   Brief hospital course:   Assessment & Plan: Nathan Moctezuma is a 73 year old male with history of non-insulin -dependent diabetes mellitus, PAD, hypertension, history of gout, COPD on chronic 2 L nasal cannula at baseline, history of heart failure preserved ejection fraction, who presented to emergency department for chief concerns of weakness for 10 days.    Paroxysmal atrial tachycardia Acute dCHF -- initial EKG showed atrial fibrillation however rhythm strip evaluated by cardiology showed PACs with sinus tachycardia.  [facility] cardiology input noted, favored secondary to untreated sleep apnea/obesity hypoventilation syndrome.  Echo EF55-60%. -- ordered Cardizem  30 mg Q8 then transitioned to cardizem  120 mg daily --cont cardizem  120 mg daily --IV lasix  40 daily    Leukocytosis possible UTI -- patient not able to give any symptoms. However has abnormal UA and with weakness so treated with three doses of Rocephin . 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 Tolerating it well.  --CPAP nightly   Generalized weakness deconditioning/recent fall --  PT/OT--rehab. Pt very deconditioned   Type II diabetes  Hyperglycemia due to steroid use --resume home glipizide  after discharge. 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 No acute osseous abnormality. 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  Moderate to severe lateral recess stenosis at the left L3, bilateral L4, right greater than left L5, and left S1 nerve  levels.  Severe neural foraminal stenosis at the left L3, right L4, and left L5 nerve levels. -- Neurosurgery consultation with Dr. Claudene-- he recommended Decadron  with taper.  Per Dr. Claudene, no isolated, localized radiculopathy and more like a plexopathy or multilevel radiculitis, and therefore no great benefit for inpatient IR steroid injection. --cont oral decadron  with taper --cont Lyrica  (New)   Bilateral ankle, right knee pain  Possible gout flare -- patient said he has history of gout, and pain felt like gout flare.  Already on allopurinol  --Ortho consult with Dr Edie -- x-ray right knee--knee effusion.  --cont steroid as above --PRN oxycodone  and lidocaine  patches help.    Hyperkalemia, resolved --mild --resolved after IV lasix     DVT prophylaxis: Lovenox  SQ Code Status: Full code  Family Communication:  Level of care: Med-Surg Dispo:   The patient is from: home  Anticipated d/c is to: SNF rehab Anticipated d/c date is: whenever SNF can accept   Subjective and Interval History:  Pt reported ankle pain and leg swelling improved.   Objective: Vitals:   03/19/24 1918 03/20/24 0402 03/20/24 0726 03/20/24 1636  BP: (!) 126/44 (!) 146/67 (!) 144/73 114/66  Pulse: 65 64 66 (!) 59  Resp:   16 20  Temp: (!) 97.5 F (36.4 C) (!) 97.5 F (36.4 C) 97.7 F (36.5 C) 98 F (36.7 C)  TempSrc:      SpO2: 93% 92% 95% 97%  Weight:      Height:        Intake/Output Summary (Last 24 hours) at 03/20/2024 1704 Last data filed at 03/20/2024 1553 Gross per 24 hour  Intake 240 ml  Output 1450 ml  Net -1210 ml  Filed Weights   03/18/24 0500 03/19/24 0437 03/19/24 1720  Weight: 131.2 kg 131.2 kg 120.2 kg    Examination:   Constitutional: NAD, AAOx3 HEENT: conjunctivae and lids normal, EOMI CV: No cyanosis.   RESP: normal respiratory effort, on 2L Neuro: II - XII grossly intact.   Psych: Normal mood and affect.  Appropriate judgement and reason   Data Reviewed: I have  personally reviewed labs and imaging studies  Time spent: 35 minutes  [name], MD [facility] If 7PM-7AM, please contact night-coverage 03/20/2024, 5:04 PM

## 2024-03-20 NOTE — Progress Notes (Signed)
 Physical Therapy Treatment Patient Details Name: Alfred Carney MRN: [id number] DOB: June 16, 1951 Today's Date: 03/20/2024   History of Present Illness 73 year old male with history of non-insulin -dependent diabetes mellitus, PAD, hyperlipidemia, hypertension, history of gout, BPH, COPD on chronic 2 L nasal cannula at baseline, history of heart failure preserved ejection fraction, who presents emergency department for chief concerns of weakness for 10 days.    PT Comments  At beginning of PT session, pt peformed supine to sit at supervision level. After standing with RW and taking a couple of steps, pt's LEs gave way and pt had to be seated on bed quickly and required max A +2 to get safely back into bed scooted up to [facility]. Suspect that pt had a BP drop when in standing but was only able to get a BP reading after pt was safely back in bed in supine position 107/68, SPO2 on 3 L 96% HR 93bpm. Continued PT will assist pt towards greater standing tolerance, LE strengthening, and activity tolerance with all mobility to increase safety and independence and decrease burden of care with functional mobility.     If plan is discharge home, recommend the following: A lot of help with walking and/or transfers;A lot of help with bathing/dressing/bathroom;Assistance with cooking/housework;Assist for transportation;Help with stairs or ramp for entrance   Can travel by private vehicle     No  Equipment Recommendations  None recommended by PT    Recommendations for Other Services       Precautions / Restrictions Precautions Precautions: Fall Recall of Precautions/Restrictions: Intact Restrictions Weight Bearing Restrictions Per Provider Order: No     Mobility  Bed Mobility Overal bed mobility: Needs Assistance Bed Mobility: Supine to Sit, Sit to Supine Rolling: Mod assist, Min assist   Supine to sit: Supervision, HOB elevated, Used rails Sit to supine: Mod assist, +2 for physical assistance, Max assist    General bed mobility comments: At beginning of PT session pt peformed supine to sit at supervision level.  After standing and taking a couple of steps, pt's LEs gave way and pt had to be seated on bed quickly and required max A +2 to get safely back into bed scooted up to [facility].  Suspect that pt had a BP drop when in standing but was only able to get a BP reading after pt was safety back in bed in supine position 107/68, SPO2 on 3 L 96% HR 93bpm.    Transfers Overall transfer level: Needs assistance Equipment used: Rolling walker (2 wheels) Transfers: Sit to/from Stand Sit to Stand: Min assist, +2 safety/equipment, From elevated surface           General transfer comment: Pt reported light headedness and returned pt to sitting; pt then able to stand again; SPO2 on 2 LO2 92%, HR 75bpm increased O2 to 4L then down to 3 L with SPO2 staying > 90%.    Ambulation/Gait Ambulation/Gait assistance: +2 physical assistance, +2 safety/equipment, Mod assist Gait Distance (Feet): 2 Feet Assistive device: Rolling walker (2 wheels) Gait Pattern/deviations: Step-to pattern       General Gait Details: pt took 2steps away from bed then said he was giving out; quickly got pt seated back on bed.  Suspect that pt's BP dropped (see note in TRANSFER section.)   Stairs             Wheelchair Mobility     Tilt Bed    Modified Rankin (Stroke Patients Only)       Balance Overall  balance assessment: Needs assistance Sitting-balance support: Feet supported, Single extremity supported, Bilateral upper extremity supported Sitting balance-Leahy Scale: Fair Sitting balance - Comments: Good  balance initially EOB but difficulty with balance after standing and gait attempt.   Standing balance support: Reliant on assistive device for balance, Bilateral upper extremity supported Standing balance-Leahy Scale: Fair Standing balance comment: use of RW and Min A x2 for safety.                             Communication Communication Communication: Impaired Factors Affecting Communication: Hearing impaired  Cognition Arousal: Alert Behavior During Therapy: WFL for tasks assessed/performed   PT - Cognitive impairments: No apparent impairments                         Following commands: Intact      Cueing Cueing Techniques: Verbal cues, Tactile cues  Exercises Total Joint Exercises Short Arc Quad: AROM, Both, 5 reps    General Comments        Pertinent Vitals/Pain Pain Assessment Pain Assessment: Faces Faces Pain Scale: Hurts even more Pain Location: R ankle and knee Pain Descriptors / Indicators: Aching, Discomfort Pain Intervention(s): Premedicated before session    Home Living                          Prior Function            PT Goals (current goals can now be found in the care plan section) Acute Rehab PT Goals Patient Stated Goal: to decrease pain and feel better PT Goal Formulation: With patient Time For Goal Achievement: 03/28/24 Potential to Achieve Goals: Good Progress towards PT goals: Progressing toward goals    Frequency    Min 2X/week      PT Plan      Co-evaluation              AM-PAC PT 6 Clicks Mobility   Outcome Measure  Help needed turning from your back to your side while in a flat bed without using bedrails?: A Little Help needed moving from lying on your back to sitting on the side of a flat bed without using bedrails?: A Little Help needed moving to and from a bed to a chair (including a wheelchair)?: A Lot Help needed standing up from a chair using your arms (e.g., wheelchair or bedside chair)?: A Lot Help needed to walk in hospital room?: A Lot Help needed climbing 3-5 steps with a railing? : Total 6 Click Score: 13    End of Session Equipment Utilized During Treatment: Oxygen ;Gait belt Activity Tolerance: Treatment limited secondary to medical complications (Comment)  (orthostatic?) Patient left: in bed;with nursing/sitter in room Nurse Communication: Mobility status PT Visit Diagnosis: Unsteadiness on feet (R26.81);Other abnormalities of gait and mobility (R26.89);Muscle weakness (generalized) (M62.81)     Time: 8970-8891 PT Time Calculation (min) (ACUTE ONLY): 39 min  Charges:    $Therapeutic Activity: 38-52 mins PT General Charges $$ ACUTE PT VISIT: 1 Visit                     Harland Irving, PTA  03/20/24, 11:41 AM

## 2024-03-21 DIAGNOSIS — I5033 Acute on chronic diastolic (congestive) heart failure: Secondary | ICD-10-CM | POA: Diagnosis not present

## 2024-03-21 LAB — BASIC METABOLIC PANEL WITH GFR
Anion gap: 12 (ref 5–15)
BUN: 49 mg/dL — ABNORMAL HIGH (ref 8–23)
CO2: 30 mmol/L (ref 22–32)
Calcium: 8.8 mg/dL — ABNORMAL LOW (ref 8.9–10.3)
Chloride: 93 mmol/L — ABNORMAL LOW (ref 98–111)
Creatinine, Ser: 1.19 mg/dL (ref 0.61–1.24)
GFR, Estimated: >60 mL/min (ref >60)
Glucose, Bld: 269 mg/dL — ABNORMAL HIGH (ref 70–99)
Potassium: 5.5 mmol/L — ABNORMAL HIGH (ref 3.5–5.1)
Sodium: 135 mmol/L (ref 135–145)

## 2024-03-21 LAB — GLUCOSE, CAPILLARY
Glucose-Capillary: 262 mg/dL — ABNORMAL HIGH (ref 70–99)
Glucose-Capillary: 242 mg/dL — ABNORMAL HIGH (ref 70–99)
Glucose-Capillary: 224 mg/dL — ABNORMAL HIGH (ref 70–99)
Glucose-Capillary: 271 mg/dL — ABNORMAL HIGH (ref 70–99)

## 2024-03-21 MED ORDER — INSULIN ASPART 100 UNIT/ML IJ SOLN
10.0000 [IU] | Freq: Three times a day (TID) | INTRAMUSCULAR | Status: DC
  Administered 2024-03-21 – 2024-03-23 (×5): 10 [IU] via SUBCUTANEOUS
  Filled 2024-03-21 (×5): qty 1

## 2024-03-21 NOTE — Progress Notes (Signed)
 PROGRESS NOTE    Alfred Carney  FMW:[id number] DOB: 1950/10/10 DOA: 03/11/2024 PCP: Corwin Antu, FNP  101A/101A-AA  LOS: 10 days   Brief hospital course:   Assessment & Plan: Alfred Carney is a 73 year old male with history of non-insulin -dependent diabetes mellitus, PAD, hypertension, history of gout, COPD on chronic 2 L nasal cannula at baseline, history of heart failure preserved ejection fraction, who presented to emergency department for chief concerns of weakness for 10 days.    Paroxysmal atrial tachycardia Acute dCHF -- initial EKG showed atrial fibrillation however rhythm strip evaluated by cardiology showed PACs with sinus tachycardia.  [facility] cardiology input noted, favored secondary to untreated sleep apnea/obesity hypoventilation syndrome.  Echo EF55-60%. -- ordered Cardizem  30 mg Q8 then transitioned to cardizem  120 mg daily --cont cardizem  120 mg daily --cont IV lasix  40 mg daily   Leukocytosis possible UTI -- patient not able to give any symptoms. However has abnormal UA and with weakness so treated with three doses of Rocephin . 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 Tolerating it well.  --CPAP nightly   Generalized weakness deconditioning/recent fall --  PT/OT--rehab. Pt very deconditioned   Type II diabetes  Hyperglycemia due to steroid use --resume home glipizide  after discharge. 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 No acute osseous abnormality. 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  Moderate to severe lateral recess stenosis at the left L3, bilateral L4, right greater than left L5, and  left S1 nerve levels.  Severe neural foraminal stenosis at the left L3, right L4, and left L5 nerve levels. -- Neurosurgery consultation with Dr. Claudene-- he recommended Decadron  with taper.  Per Dr. Claudene, no isolated, localized radiculopathy and more like a plexopathy or multilevel radiculitis, and therefore no great benefit for inpatient IR steroid injection. --cont oral decadron  with taper --cont Lyrica  (New)   Bilateral ankle, right knee pain  Possible gout flare -- patient said he has history of gout, and pain felt like gout flare.  Already on allopurinol  --Ortho consult with Dr Edie -- x-ray right knee--knee effusion.  --cont steroid as above --PRN oxycodone  and lidocaine  patches help.    Hyperkalemia, resolved --hold home irbesartan  --cont IV lasix     DVT prophylaxis: Lovenox  SQ Code Status: Full code  Family Communication: daughter updated at bedside today Level of care: Med-Surg Dispo:   The patient is from: home  Anticipated d/c is to: SNF rehab Anticipated d/c date is: whenever SNF can accept   Subjective and Interval History:  No new complaint today.   Objective: Vitals:   03/20/24 2006 03/21/24 0450 03/21/24 0742 03/21/24 1551  BP: 136/65 (!) 148/63 137/62 (!) 124/55  Pulse: 64 63 70 63  Resp: 18 18 18    Temp:  97.6 F (36.4 C) 97.9 F (36.6 C) 98.1 F (36.7 C)  TempSrc:    Oral  SpO2:  96% 97% 93%  Weight:      Height:        Intake/Output Summary (Last 24 hours) at 03/21/2024 1812 Last data filed at 03/21/2024 1545 Gross per 24 hour  Intake 480 ml  Output 2450 ml  Net -1970 ml   [employer]  03/18/24 0500 03/19/24 0437 03/19/24 1720  Weight: 131.2 kg 131.2 kg 120.2 kg    Examination:   Constitutional: NAD, AAOx3 HEENT: conjunctivae and lids normal, EOMI CV: No cyanosis.   RESP: normal respiratory effort, on 2L SKIN: warm, dry Neuro: II - XII grossly intact.   Psych: Normal mood and affect.  Appropriate judgement and reason   Data  Reviewed: I have personally reviewed labs and imaging studies  Time spent: 35 minutes  [name], MD [facility] If 7PM-7AM, please contact night-coverage 03/21/2024, 6:12 PM

## 2024-03-21 NOTE — Plan of Care (Signed)

## 2024-03-22 DIAGNOSIS — I5033 Acute on chronic diastolic (congestive) heart failure: Secondary | ICD-10-CM | POA: Diagnosis not present

## 2024-03-22 LAB — BASIC METABOLIC PANEL WITH GFR
Anion gap: 11 (ref 5–15)
BUN: 54 mg/dL — ABNORMAL HIGH (ref 8–23)
CO2: 30 mmol/L (ref 22–32)
Calcium: 9 mg/dL (ref 8.9–10.3)
Chloride: 93 mmol/L — ABNORMAL LOW (ref 98–111)
Creatinine, Ser: 1.31 mg/dL — ABNORMAL HIGH (ref 0.61–1.24)
GFR, Estimated: 57 mL/min — ABNORMAL LOW (ref >60)
Glucose, Bld: 209 mg/dL — ABNORMAL HIGH (ref 70–99)
Potassium: 5.1 mmol/L (ref 3.5–5.1)
Sodium: 134 mmol/L — ABNORMAL LOW (ref 135–145)

## 2024-03-22 LAB — GLUCOSE, CAPILLARY
Glucose-Capillary: 257 mg/dL — ABNORMAL HIGH (ref 70–99)
Glucose-Capillary: 148 mg/dL — ABNORMAL HIGH (ref 70–99)
Glucose-Capillary: 230 mg/dL — ABNORMAL HIGH (ref 70–99)
Glucose-Capillary: 167 mg/dL — ABNORMAL HIGH (ref 70–99)

## 2024-03-22 LAB — CBC
HCT: 47.4 % (ref 39.0–52.0)
Hemoglobin: 15.5 g/dL (ref 13.0–17.0)
MCH: 28.9 pg (ref 26.0–34.0)
MCHC: 32.7 g/dL (ref 30.0–36.0)
MCV: 88.3 fL (ref 80.0–100.0)
Platelets: 601 K/uL — ABNORMAL HIGH (ref 150–400)
RBC: 5.37 MIL/uL (ref 4.22–5.81)
RDW: 14.5 % (ref 11.5–15.5)
WBC: 16.2 K/uL — ABNORMAL HIGH (ref 4.0–10.5)
nRBC: 0 % (ref 0.0–0.2)

## 2024-03-22 LAB — HEMOGLOBIN A1C
Hgb A1c MFr Bld: 7.9 % — ABNORMAL HIGH (ref 4.8–5.6)
Mean Plasma Glucose: 180.03 mg/dL

## 2024-03-22 MED FILL — Lidocaine Patch 5%: CUTANEOUS | Qty: 1 | Status: AC

## 2024-03-22 NOTE — Progress Notes (Signed)
 PROGRESS NOTE    Alfred Carney  FMW:[id number] DOB: [birth year] DOA: 03/11/2024 PCP: Corwin Antu, FNP  101A/101A-AA  LOS: 11 days   Brief hospital course:   Assessment & Plan: Alfred Carney is a 73 year old male with history of non-insulin -dependent diabetes mellitus, PAD, hypertension, history of gout, COPD on chronic 2 L nasal cannula at baseline, history of heart failure preserved ejection fraction, who presented to emergency department for chief concerns of weakness for 10 days.    Paroxysmal atrial tachycardia Acute dCHF -- initial EKG showed atrial fibrillation however rhythm strip evaluated by cardiology showed PACs with sinus tachycardia.  [facility] cardiology input noted, favored secondary to untreated sleep apnea/obesity hypoventilation syndrome.  Echo EF55-60%. -- ordered Cardizem  30 mg Q8 then transitioned to cardizem  120 mg daily --cont cardizem  120 mg daily --hold further IV lasix  due to Cr bump   Leukocytosis possible UTI -- patient not able to give any symptoms. However has abnormal UA and with weakness so treated with three doses of Rocephin . 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 Tolerating it well.  --CPAP nightly   Generalized weakness deconditioning/recent fall --  PT/OT--rehab. Pt very deconditioned   Type II diabetes  Hyperglycemia due to steroid use --resume home glipizide  after discharge. 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 No acute osseous abnormality. 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  Moderate to severe lateral recess stenosis at the left L3, bilateral L4, right greater than left L5, and  left S1 nerve levels.  Severe neural foraminal stenosis at the left L3, right L4, and left L5 nerve levels. -- Neurosurgery consultation with Dr. Claudene-- he recommended Decadron  with taper.  Per Dr. Claudene, no isolated, localized radiculopathy and more like a plexopathy or multilevel radiculitis, and therefore no great benefit for inpatient IR steroid injection. --cont oral decadron  with taper --cont Lyrica  (New)   Bilateral ankle, right knee pain  Possible gout flare -- patient said he has history of gout, and pain felt like gout flare.  Already on allopurinol  --Ortho consult with Dr Edie -- x-ray right knee--knee effusion.  --cont steroid as above --PRN oxycodone  and lidocaine  patches help.    Hyperkalemia, resolved --hold home irbesartan     DVT prophylaxis: Lovenox  SQ Code Status: Full code  Family Communication: family updated at bedside today Level of care: Med-Surg Dispo:   The patient is from: home  Anticipated d/c is to: SNF rehab Anticipated d/c date is: whenever SNF can accept   Subjective and Interval History:  Pt reported swelling improved.   Objective: Vitals:   03/22/24 0500 03/22/24 0736 03/22/24 1626 03/22/24 2017  BP:  119/66 (!) 108/55 (!) 106/54  Pulse:  65 75 77  Resp:  19 20 19   Temp:  98.6 F (37 C) 97.7 F (36.5 C) (!) 97.5 F (36.4 C)  TempSrc:      SpO2:  96% 96% 94%  Weight: 128.8 kg     Height:        Intake/Output Summary (Last 24 hours) at 03/22/2024 2030 Last data filed at 03/22/2024 1425 Gross per 24 hour  Intake 240 ml  Output 1825 ml  Net -1585 ml   Filed Weights   03/19/24  9562 03/19/24 1720 03/22/24 0500  Weight: 131.2 kg 120.2 kg 128.8 kg    Examination:   Constitutional: NAD, AAOx3 HEENT: conjunctivae and lids normal, EOMI CV: No cyanosis.   RESP: normal respiratory effort, on 2L SKIN: warm, dry Neuro: II - XII grossly intact.   Psych: Normal mood and affect.  Appropriate judgement and reason   Data Reviewed: I  have personally reviewed labs and imaging studies  Time spent: 35 minutes  [name], MD [facility] If 7PM-7AM, please contact night-coverage 03/22/2024, 8:30 PM

## 2024-03-22 NOTE — TOC Progression Note (Addendum)
 Transition of Care [facility]) - Progression Note    Patient Details  Name: Alfred Carney MRN: [id number] Date of Birth: [birth year]  Transition of [facility]) CM/SW Contact  [name], RN Phone Number: 03/22/2024, 10:51 AM  Clinical Narrative:     CM follow up in [place], auth expired. CM initiated shara shara ID [id number] is approved, next review date is 03/24/2024. CM secure message to Therisa, Admissions, [employer]. Per Therisa, will check on CPAP delivery before patient arrives.   CM call to Therisa, Admissions, [employer], phone: [phone] regarding CPAP. Per Therisa, CPAP has not arrived yet and fill follow up tomorrow. CM call to patient's wife, phone: [phone] regarding personal CPAP, no answer, CM left message for return call.   CM call to Therisa, Admissions, [employer] and [employer] regarding CPAP arrival and patient to admit without CPAP per Dr. Awanda. Per Therisa, MD will need state patient can admit in discharge summary before SNF can accept patient without CPAP. CM alert to Dr. Awanda regarding SNF response.   CM call to [employer], phone: [phone] regarding BLS scheduling for 1030 on 03/23/2024.CM spoke to [place]. Expected Discharge Plan: Skilled Nursing Facility    Expected Discharge Plan and Services    SNF     Expected Discharge Date: 03/18/24                 DME Agency: [employer]   Social Determinants of Health (SDOH) Interventions SDOH Screenings   Food Insecurity: No Food Insecurity (03/16/2024)  Housing: Unknown (03/16/2024)  Transportation Needs: No Transportation Needs (03/16/2024)  Utilities: Not At Risk (03/12/2024)  Alcohol Screen: Low Risk  (05/09/2023)  Depression (PHQ2-9): Medium Risk (11/18/2023)  Financial Resource Strain: Low Risk  (03/16/2024)  Physical Activity: Insufficiently Active (05/27/2023)  Social Connections: Moderately Integrated (03/12/2024)  Stress: No Stress Concern Present (05/27/2023)   Tobacco Use: Medium Risk (03/16/2024)  Health Literacy: Adequate Health Literacy (05/12/2023)    Readmission Risk Interventions     No data to display

## 2024-03-22 NOTE — Inpatient Diabetes Management (Signed)
 Inpatient Diabetes Program Recommendations  AACE/ADA: New Consensus Statement on Inpatient Glycemic Control (2015)  Target Ranges:  Prepandial:   less than 140 mg/dL      Peak postprandial:   less than 180 mg/dL (1-2 hours)      Critically ill patients:  140 - 180 mg/dL   Lab Results  Component Value Date   GLUCAP 148 (H) 03/22/2024   HGBA1C 7.0 (H) 11/18/2023    Review of Glycemic Control  Latest Reference Range & Units 03/21/24 07:40 03/21/24 11:46 03/21/24 17:00 03/21/24 20:44 03/22/24 07:37 03/22/24 12:25  Glucose-Capillary 70 - 99 mg/dL 737 (H) 757 (H) 775 (H) 271 (H) 257 (H) 148 (H)   Diabetes history: DM 2 Outpatient Diabetes medications:  Glucotrol  5 mg bid Current orders for Inpatient glycemic control:  Novolog  0-20 units tid with meals and HS Novolog  10 units tid with meals   Inpatient Diabetes Program Recommendations:    If appropriate, consider adding Semglee  12 units daily.    Thanks,  Randall Bullocks, RN, BC-ADM Inpatient Diabetes Coordinator Pager [phone]  (8a-5p)

## 2024-03-23 DIAGNOSIS — I5033 Acute on chronic diastolic (congestive) heart failure: Secondary | ICD-10-CM | POA: Diagnosis not present

## 2024-03-23 LAB — GLUCOSE, CAPILLARY: Glucose-Capillary: 195 mg/dL — ABNORMAL HIGH (ref 70–99)

## 2024-03-23 MED ORDER — DEXAMETHASONE 0.5 MG PO TABS
1.0000 mg | ORAL_TABLET | Freq: Three times a day (TID) | ORAL | Status: DC
  Administered 2024-03-23: 1 mg via ORAL
  Filled 2024-03-23 (×3): qty 2

## 2024-03-23 MED ORDER — INSULIN GLARGINE-YFGN 100 UNIT/ML ~~LOC~~ SOLN: 10.0000 [IU] | Freq: Every day | SUBCUTANEOUS | Status: DC

## 2024-03-23 MED ORDER — TRAMADOL HCL 50 MG PO TABS: 50.0000 mg | ORAL_TABLET | Freq: Four times a day (QID) | ORAL | 0 refills | Status: DC | PRN

## 2024-03-23 MED ORDER — INSULIN GLARGINE-YFGN 100 UNIT/ML ~~LOC~~ SOLN
10.0000 [IU] | Freq: Every day | SUBCUTANEOUS | Status: DC
  Administered 2024-03-23: 10 [IU] via SUBCUTANEOUS
  Filled 2024-03-23: qty 0.1

## 2024-03-23 MED ORDER — ADULT MULTIVITAMIN W/MINERALS CH: 1.0000 | ORAL_TABLET | Freq: Every day | ORAL | Status: DC

## 2024-03-23 MED ORDER — INSULIN ASPART 100 UNIT/ML IJ SOLN: 0.0000 [IU] | Freq: Three times a day (TID) | INTRAMUSCULAR | Status: DC

## 2024-03-23 MED ORDER — DEXAMETHASONE 1 MG PO TABS: ORAL_TABLET | ORAL | Status: AC

## 2024-03-23 NOTE — Progress Notes (Signed)
 Patient report given to LPN Angella. All questions are answered via phone. No any questions at this time.

## 2024-03-23 NOTE — TOC Transition Note (Addendum)
 Transition of Care [facility]) - Discharge Note   Patient Details  Name: Alfred Carney MRN: [id number] Date of Birth: 07/19/1951  Transition of Care [facility]) CM/SW Contact:  [name], RN 03/23/2024, 10:18 AM   Clinical Narrative:     Discharge orders noted. Patient to discharge to [employer] today via Geographical information systems officer at 1030-1100. CM provided RN report number to [employer]. CM awaiting discharge summary. CM call to patient's wife, [name], phone: [phone] regarding pending discharge and BLS transport. Patient's wife, verbalized understanding and agreement. Per patient's wife, will meet patient at SNF.   Discharge summary noted faxed to [facility] via Epic.   Final next level of care: Skilled Nursing Facility Barriers to Discharge: No Barriers Identified   Patient Goals and CMS Choice    SNF/[facility] [place] Choice offered to / list presented to : Spouse  ownership interest in [facility].provided to:: Spouse    Discharge Placement    SNF            Discharge Plan and Services Additional resources added to the After Visit Summary for              DME Agency: [employer]  Social Drivers of Health (SDOH) Interventions SDOH Screenings   Food Insecurity: No Food Insecurity (03/16/2024)  Housing: Unknown (03/16/2024)  Transportation Needs: No Transportation Needs (03/16/2024)  Utilities: Not At Risk (03/12/2024)  Alcohol Screen: Low Risk  (05/09/2023)  Depression (PHQ2-9): Medium Risk (11/18/2023)  Financial Resource Strain: Low Risk  (03/16/2024)  Physical Activity: Insufficiently Active (05/27/2023)  Social Connections: Moderately Integrated (03/12/2024)  Stress: No Stress Concern Present (05/27/2023)  Tobacco Use: Medium Risk (03/16/2024)  Health Literacy: Adequate Health Literacy (05/12/2023)     Readmission Risk Interventions     No data to display

## 2024-03-25 ENCOUNTER — Telehealth: Payer: Self-pay | Admitting: Family

## 2024-03-25 NOTE — Telephone Encounter (Signed)
 Called to confirm/remind patient of their appointment at the [facility] on 03/26/24.   Appointment:   [] Confirmed  [x] Left mess   [] No answer/No voice mail  [] VM Full/unable to leave message  [] Phone not in service  Patient reminded to bring all medications and/or complete list.  Confirmed patient has transportation. Gave directions, instructed to utilize valet parking.

## 2024-03-26 ENCOUNTER — Ambulatory Visit: Attending: Family | Admitting: Family

## 2024-03-26 ENCOUNTER — Encounter: Payer: Self-pay | Admitting: Family

## 2024-03-26 VITALS — BP 109/54 | HR 91 | Wt 296.0 lb

## 2024-03-26 DIAGNOSIS — I4891 Unspecified atrial fibrillation: Secondary | ICD-10-CM | POA: Insufficient documentation

## 2024-03-26 DIAGNOSIS — G4733 Obstructive sleep apnea (adult) (pediatric): Secondary | ICD-10-CM | POA: Diagnosis not present

## 2024-03-26 DIAGNOSIS — E1151 Type 2 diabetes mellitus with diabetic peripheral angiopathy without gangrene: Secondary | ICD-10-CM | POA: Diagnosis not present

## 2024-03-26 DIAGNOSIS — I428 Other cardiomyopathies: Secondary | ICD-10-CM | POA: Diagnosis not present

## 2024-03-26 DIAGNOSIS — E785 Hyperlipidemia, unspecified: Secondary | ICD-10-CM

## 2024-03-26 DIAGNOSIS — I130 Hypertensive heart and chronic kidney disease with heart failure and stage 1 through stage 4 chronic kidney disease, or unspecified chronic kidney disease: Secondary | ICD-10-CM | POA: Diagnosis not present

## 2024-03-26 DIAGNOSIS — Z794 Long term (current) use of insulin: Secondary | ICD-10-CM | POA: Diagnosis not present

## 2024-03-26 DIAGNOSIS — I5032 Chronic diastolic (congestive) heart failure: Secondary | ICD-10-CM | POA: Diagnosis not present

## 2024-03-26 DIAGNOSIS — I878 Other specified disorders of veins: Secondary | ICD-10-CM | POA: Diagnosis not present

## 2024-03-26 DIAGNOSIS — M47896 Other spondylosis, lumbar region: Secondary | ICD-10-CM | POA: Insufficient documentation

## 2024-03-26 DIAGNOSIS — Z7984 Long term (current) use of oral hypoglycemic drugs: Secondary | ICD-10-CM | POA: Insufficient documentation

## 2024-03-26 DIAGNOSIS — Z91199 Patient's noncompliance with other medical treatment and regimen due to unspecified reason: Secondary | ICD-10-CM | POA: Diagnosis not present

## 2024-03-26 DIAGNOSIS — Z6841 Body Mass Index (BMI) 40.0 and over, adult: Secondary | ICD-10-CM | POA: Insufficient documentation

## 2024-03-26 DIAGNOSIS — R9431 Abnormal electrocardiogram [ECG] [EKG]: Secondary | ICD-10-CM | POA: Insufficient documentation

## 2024-03-26 DIAGNOSIS — Z87891 Personal history of nicotine dependence: Secondary | ICD-10-CM | POA: Insufficient documentation

## 2024-03-26 DIAGNOSIS — Z79899 Other long term (current) drug therapy: Secondary | ICD-10-CM | POA: Diagnosis not present

## 2024-03-26 DIAGNOSIS — E1169 Type 2 diabetes mellitus with other specified complication: Secondary | ICD-10-CM | POA: Diagnosis not present

## 2024-03-26 DIAGNOSIS — N400 Enlarged prostate without lower urinary tract symptoms: Secondary | ICD-10-CM | POA: Diagnosis not present

## 2024-03-26 DIAGNOSIS — E1122 Type 2 diabetes mellitus with diabetic chronic kidney disease: Secondary | ICD-10-CM | POA: Diagnosis not present

## 2024-03-26 DIAGNOSIS — Z9981 Dependence on supplemental oxygen: Secondary | ICD-10-CM | POA: Diagnosis not present

## 2024-03-26 DIAGNOSIS — Z9181 History of falling: Secondary | ICD-10-CM | POA: Diagnosis not present

## 2024-03-26 DIAGNOSIS — I10 Essential (primary) hypertension: Secondary | ICD-10-CM

## 2024-03-26 DIAGNOSIS — M48061 Spinal stenosis, lumbar region without neurogenic claudication: Secondary | ICD-10-CM | POA: Diagnosis not present

## 2024-03-26 DIAGNOSIS — J449 Chronic obstructive pulmonary disease, unspecified: Secondary | ICD-10-CM

## 2024-03-26 DIAGNOSIS — I4719 Other supraventricular tachycardia: Secondary | ICD-10-CM

## 2024-03-26 DIAGNOSIS — N1831 Chronic kidney disease, stage 3a: Secondary | ICD-10-CM

## 2024-03-26 DIAGNOSIS — R531 Weakness: Secondary | ICD-10-CM | POA: Insufficient documentation

## 2024-03-26 DIAGNOSIS — N189 Chronic kidney disease, unspecified: Secondary | ICD-10-CM | POA: Insufficient documentation

## 2024-03-26 MED ORDER — EMPAGLIFLOZIN 10 MG PO TABS: 10.0000 mg | ORAL_TABLET | Freq: Every day | ORAL | 5 refills | Status: DC

## 2024-03-26 NOTE — Patient Instructions (Signed)
 Medication Changes:  Start Jardiance  10 MG once daily   Follow-Up in: 3-4 weeks with Ellouise Class, FNP.  At the [facility], you and your health needs are our priority. We have a designated team specialized in the treatment of Heart Failure. This Care Team includes your primary Heart Failure Specialized Cardiologist (physician), Advanced Practice Providers (APPs- Physician Assistants and Nurse Practitioners), and Pharmacist who all work together to provide you with the care you need, when you need it.   You may see any of the following providers on your designated Care Team at your next follow up:  Dr. Toribio Fuel Dr. Ezra Shuck Dr. Ria Commander Dr. Odis Brownie Ellouise Class, FNP Jaun Bash, RPH-CPP  Please be sure to bring in all your medications bottles to every appointment.   Need to Contact Us :  If you have any questions or concerns before your next appointment please send us  a message through [place] or call our office at [phone].    TO LEAVE A MESSAGE FOR THE NURSE SELECT OPTION 2, PLEASE LEAVE A MESSAGE INCLUDING: YOUR NAME DATE OF BIRTH CALL BACK NUMBER REASON FOR CALL**this is important as we prioritize the call backs  YOU WILL RECEIVE A CALL BACK THE SAME DAY AS LONG AS YOU CALL BEFORE 4:00 PM

## 2024-03-26 NOTE — Progress Notes (Signed)
 Advanced Heart Failure Clinic Note   Referring Physician: admission PCP: Corwin Antu, FNP Cardiologist: Peter Swaziland, MD   Chief Complaint: shortness of breath   HPI:  Mr Glogowski is a 73 y/o male with a history of non-insulin -dependent diabetes mellitus, PAD, hyperlipidemia, hypertension, history of gout, BPH, OSA (non compliant w/ CPAP), COPD on chronic 2 L nasal cannula at baseline, lumbar spine DJD with stenosis, venous stasis, CKD, AF/ RVR (06/25), paroxsymal atrial tachycardia and HFpEF.   Pertinent cardiac history: Normal Lexiscan  Myoview  in 09/2011. Echo in 10/2018 noted LVEF of 60-65%. Echo in 05/2020 with LVEF unchanged and grade I diastolic dysfunction.  Admitted 03/11/24 with weakness over ~2 weeks and recent fall. On admission, BNP was 116.1, HS-troponin was 16, lactic acid 1.2, uric acid 5.5, and TSH was 1.339. Chest x-ray noted emphysema. Initial EKG showed atrial fibrillation however rhythm strip evaluated by cardiology showed PACs with sinus tachycardia.  [facility] cardiology input noted, favored secondary to untreated sleep apnea/obesity hypoventilation syndrome. Echo 03/12/24: LVEF of 55-60%, and grade I diastolic dysfunction. Started on cardizem  and given IV lasix . Abnormal UA and with weakness so treated with three doses of Rocephin . Urine culture negative.   He presents today, with his daughter, from [facility] with a chief complaint of shortness of breath Has associated fatigue, leg weakness, pedal edema (improving). He says that he's been feeling pretty good up until today where he's been experiencing more shortness of breath. He's been receiving daily PT. Not getting weighed daily at facility.  He says that he fell earlier today when he bent over at the waist, he continued on over. Denies dizziness or syncope leading up to this event. He says that his legs get weak and he just goes on over.    Review of Systems: [y] = yes, [ ]  = no   General: Weight gain [  ]; Weight loss [ ] ; Anorexia [ ] ; Fatigue Davis.Dad ]; Fever [ ] ; Chills [ ] ; Weakness Davis.Dad ]  Cardiac: Chest pain/pressure [ ] ; Resting SOB [ ] ; Exertional SOB Davis.Dad ]; Orthopnea [ ] ; Pedal Edema Davis.Dad ]; Palpitations [ ] ; Syncope [ ] ; Presyncope [ ] ; Paroxysmal nocturnal dyspnea[ ]   Pulmonary: Cough [ ] ; Wheezing[ ] ; Hemoptysis[ ] ; Sputum [ ] ; Snoring [ ]   GI: Vomiting[ ] ; Dysphagia[ ] ; Melena[ ] ; Hematochezia [ ] ; Heartburn[ ] ; Abdominal pain [ ] ; Constipation [ ] ; Diarrhea [ ] ; BRBPR [ ]   GU: Hematuria[ ] ; Dysuria [ ] ; Nocturia[ ]   Vascular: Pain in legs with walking [ ] ; Pain in feet with lying flat [ ] ; Non-healing sores [ ] ; Stroke [ ] ; TIA [ ] ; Slurred speech [ ] ;  Neuro: Headaches[ ] ; Vertigo[ ] ; Seizures[ ] ; Paresthesias[ ] ;Blurred vision [ ] ; Diplopia [ ] ; Vision changes [ ]   Ortho/Skin: Arthritis Davis.Dad ]; Joint pain [ ] ; Muscle pain [ ] ; Joint swelling [ ] ; Back Pain [ ] ; Rash [ ]   Psych: Depression[ ] ; Anxiety[ ]   Heme: Bleeding problems [ ] ; Clotting disorders [ ] ; Anemia [ ]   Endocrine: Diabetes Davis.Dad ]; Thyroid  dysfunction[ ]    Past Medical History:  Diagnosis Date   Chronic obstructive pulmonary disease (HCC) 2008   Moderate   Colon polyps    Community acquired pneumonia    Coronary artery disease    Degenerative joint disease    Diabetes mellitus without complication (HCC)    type 2   Emphysema lung (HCC)    Enlarged liver    fatty liver by '09 CT  Gastroesophageal reflux disease    Gout    H/O hiatal hernia    History of [place] spotted fever    Possible history of [place] Spotted Fever   Hyperlipidemia    Hypertension    Hypokalemia    diuretic induced, resolved   Microcytic anemia    iron pills   Morbid obesity (HCC)    Shortness of breath    Skin cancer    skin cancer lip removed   Sleep apnea    not currently using CPAP 05/20/13   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 8 g 2   allopurinol  (ZYLOPRIM ) 100 MG tablet Take 2 tablets (200 mg total) by mouth daily. 180 tablet 3   aspirin  EC 81 MG tablet Take 1 tablet (81 mg total) by mouth daily. Swallow whole.     Calcium  Carbonate-Simethicone  750-80 MG CHEW Chew 1 tablet by mouth as needed (gas).      Cholecalciferol  (VITAMIN D  PO) Take 1,000 Units by mouth daily.     dexamethasone  (DECADRON ) 1 MG tablet Take 1 tablet (1 mg total) by mouth 3 (three) times daily for 1 day, THEN 1 tablet (1 mg total) 2 (two) times daily for 1 day, THEN 1 tablet (1 mg total) daily for 1 day.     diltiazem  (CARDIZEM  CD) 120 MG 24 hr capsule Take 1 capsule (120 mg total) by mouth daily.     furosemide  (LASIX ) 20 MG tablet Take 2 tablets by mouth once daily 60 tablet 2   glipiZIDE  (GLUCOTROL ) 5 MG tablet TAKE 1 TABLET BY MOUTH TWICE DAILY BEFORE A MEAL 180 tablet 3   glucose blood test strip Use as instructed 100 each 12   insulin  aspart (NOVOLOG ) 100 UNIT/ML injection Inject 0-20 Units into the skin 3 (three) times daily with meals. Give while on steroid. CBG 70 - 120: 0 units CBG 121 - 150: 3 units CBG 151 - 200: 4 units CBG 201 - 250: 7 units CBG 251 - 300: 11 units CBG 301 - 350: 15 units CBG 351 - 400: 20 units     insulin  glargine-yfgn (SEMGLEE ) 100 UNIT/ML injection Inject 0.1 mLs (10 Units total) into the skin daily.     Lancet Device MISC 1 Lancet by Does not apply route daily as needed. 100 each 3   lidocaine  (LIDODERM ) 5 % Place 3 patches onto the skin daily. To right knee and both ankles     meloxicam  (MOBIC ) 7.5 MG tablet Take 1 tablet by mouth once daily 90 tablet 3   Multiple Vitamin (MULTIVITAMIN WITH MINERALS) TABS tablet Take 1 tablet by mouth daily.     OXYGEN  Inhale 2 L into the lungs continuous.      pravastatin  (PRAVACHOL ) 40 MG tablet Take 1  tablet (40 mg total) by mouth daily. 90 tablet 3   pregabalin  (LYRICA ) 50 MG capsule Take 1 capsule (50 mg total) by mouth 2 (two) times daily.     tacrolimus (PROTOPIC) 0.1 % ointment Apply topically 2 (two) times daily. 2 weeks     tamsulosin  (FLOMAX ) 0.4 MG CAPS capsule Take 1 capsule by mouth at bedtime 90 capsule 1   traMADol  (ULTRAM ) 50 MG tablet Take 1 tablet (50 mg total)  by mouth every 6 (six) hours as needed for moderate pain (pain score 4-6). 20 tablet 0   No current facility-administered medications for this visit.    No Known Allergies    Social History   Socioeconomic History   Marital status: Married    Spouse name: [name]   Number of children: 4   Years of education: GED, ITT   Highest education level: Associate degree: occupational, Scientist, product/process development, or vocational program  Occupational History   Occupation: architectural drawing-retired  Tobacco Use   Smoking status: Former    Current packs/day: 0.00    Average packs/day: 1 pack/day for 35.0 years (35.0 ttl pk-yrs)    Types: Cigarettes    Start date: 09/17/1971    Quit date: 09/16/2006    Years since quitting: 17.5   Smokeless tobacco: Never  Vaping Use   Vaping status: Never Used  Substance and Sexual Activity   Alcohol use: No    Alcohol/week: 0.0 standard drinks of alcohol   Drug use: No   Sexual activity: Not Currently  Other Topics Concern   Not on file  Social History Narrative   Lives with wife   Right Handed   Drinks 4-5 cups caffeine daily   Social Drivers of Health   Financial Resource Strain: Low Risk  (03/16/2024)   Overall Financial Resource Strain (CARDIA)    Difficulty of Paying Living Expenses: Not hard at all  Food Insecurity: No Food Insecurity (03/16/2024)   Hunger Vital Sign    Worried About Running Out of Food in the Last Year: Never true    Ran Out of Food in the Last Year: Never true  Transportation Needs: No Transportation Needs (03/16/2024)   PRAPARE - Scientist, research (physical sciences) (Medical): No    Lack of Transportation (Non-Medical): No  Physical Activity: Insufficiently Active (05/27/2023)   Exercise Vital Sign    Days of Exercise per Week: 1 day    Minutes of Exercise per Session: 10 min  Stress: No Stress Concern Present (05/27/2023)   [employer] of Occupational Health - Occupational Stress Questionnaire    Feeling of Stress : Not at all  Social Connections: Moderately Integrated (03/12/2024)   Social Connection and Isolation Panel    Frequency of Communication with Friends and Family: More than three times a week    Frequency of Social Gatherings with Friends and Family: Twice a week    Attends Religious Services: Never    Database administrator or Organizations: Yes    Attends Banker Meetings: Never    Marital Status: Married  Catering manager Violence: Not At Risk (03/12/2024)   Humiliation, Afraid, Rape, and Kick questionnaire    Fear of Current or Ex-Partner: No    Emotionally Abused: No    Physically Abused: No    Sexually Abused: No      Family History  Problem Relation Age of Onset   Osteoarthritis Mother    Diabetes Mother    Pulmonary embolism Mother    Heart disease Father        Coronary Artery Disease   Stroke Father    Multiple sclerosis Sister    Factor V Leiden deficiency Sister    Emphysema Sister    Hyperlipidemia Brother    Heart attack Maternal Grandmother    Lung cancer Maternal Grandfather        smoked    Vitals:   03/26/24 1407  BP: (!) 109/54  Pulse: 91  SpO2:  95%  Weight: 296 lb (134.3 kg)   Wt Readings from Last 3 Encounters:  03/26/24 296 lb (134.3 kg)  03/23/24 (P) 271 lb 9.7 oz (123.2 kg)  12/11/23 (!) 324 lb 3.2 oz (147.1 kg)   Lab Results  Component Value Date   CREATININE 1.31 (H) 03/22/2024   CREATININE 1.19 03/21/2024   CREATININE 1.21 03/20/2024    PHYSICAL EXAM:  General: Well appearing obese male in wheelchair. No resp difficulty HEENT: normal Neck:  supple, no JVD Cor: Regular rhythm, rate. No rubs, gallops or murmurs Lungs: clear Abdomen: soft, nontender, nondistended. Extremities: no cyanosis, clubbing, rash, trace pitting edema bilaterally to shins Neuro: alert & oriented X 3. Moves all 4 extremities w/o difficulty. Affect pleasant   ECG: NSR with HR 90 (personally reviewed)   ASSESSMENT & PLAN:  1: NICM with preserved ejection fraction- - suspect due to untreated OSA/ PAT - NYHA class III - euvolemic - order written for daily weights at facility and to call for overnight weight gain of > 2 pounds - Echo 03/12/24: LVEF of 55-60%, and grade I diastolic dysfunction. - continue furosemide  40mg  daily - continue irbesartan  300mg  daily - begin jardiance  10mg  daily - BP may not be able to tolerate MRA without reduction in irbesartan  - SNF facility called and CMP was drawn earlier today but no results at this time; they will fax results to us  once they receive them - order for BMET to be drawn 04/12/24 and results faxed to us  - BNP 03/11/24 was 116.1  2: HTN- - BP 109/54 - seeing PCP at SNF - BMET 03/22/24 reviewed: sodium 134, potassium 5.1, creatinine 1.31 & GFR 57  3: DM- - A1c 03/22/24 was 7.9% - continue glipizide , insulin  - if glucose starts to get too low with the addition of jardiance , would prefer to stop glipizide   4: Hyperlipidemia-  - LDL 11/18/23 was 67 - continue pravastatin  40mg  daily  5: AF/ PAT- - EKG today is NSR - continue diltiazem  120mg  daily - continue metoprolol  succinate 25mg  daily  6: OSA- - noncompliant with CPAP as he says that he sleeps better without wearing it  7: COPD- - sees pulmonology [place]) 08/25 - wearing oxygen  at 2L around the clock. When he arrived, he was visibly SOB and asking about turning his oxygen  up. Pulse ox was 95%. When tubing was checked, it was kinked completely off where it attaches to the tank. New tubing applied and patient says that he can feel the oxygen  blowing now. By  the end of the visit, he was more comfortable and said that he was breathing better.    Return in 3-4 weeks, sooner if needed.   Ellouise DELENA Class, FNP 03/26/24

## 2024-04-05 ENCOUNTER — Telehealth: Payer: Self-pay

## 2024-04-05 ENCOUNTER — Telehealth: Payer: Self-pay | Admitting: Family

## 2024-04-05 DIAGNOSIS — R531 Weakness: Secondary | ICD-10-CM

## 2024-04-05 DIAGNOSIS — Z748 Other problems related to care provider dependency: Secondary | ICD-10-CM

## 2024-04-05 NOTE — Transitions of Care (Post Inpatient/ED Visit) (Signed)
 04/05/2024  Name: Alfred Carney MRN: [id number] DOB: 1950-09-27  Today's TOC FU Call Status: Today's TOC FU Call Status:: Successful TOC FU Call Completed TOC FU Call Complete Date: 04/05/24 Patient's Name and Date of Birth confirmed.  Transition Care Management Follow-up Telephone Call Date of Discharge: 04/04/24 Discharge Facility: Other ([facility]) Name of Other ([facility]) Discharge Facility: [facility] Type of Discharge: Inpatient Admission Primary Inpatient Discharge Diagnosis:: Acute on chronic congestive heart failure How have you been since you were released from the hospital?: Better Any questions or concerns?: No  Items Reviewed: Did you receive and understand the discharge instructions provided?: Yes Medications obtained,verified, and reconciled?: Yes (Medications Reviewed) Any new allergies since your discharge?: No Dietary orders reviewed?: NA Do you have support at home?: Yes People in Home [RPT]: significant other  Medications Reviewed Today: Medications Reviewed Today     Reviewed by Lang Avelina PARAS, CMA (Certified Medical Assistant) on 04/05/24 at 1350  Med List Status: <None>   Medication Order Taking? Sig Documenting Provider Last Dose Status Informant  albuterol  (VENTOLIN  HFA) 108 (90 Base) MCG/ACT inhaler [id number] Yes Inhale 2 puffs into the lungs every 4 (four) hours as needed for wheezing or shortness of breath. Darlean Ozell NOVAK, MD  Active Self, Pharmacy Records  allopurinol  (ZYLOPRIM ) 100 MG tablet [id number] Yes Take 2 tablets (200 mg total) by mouth daily. Corwin Antu, FNP  Active Self, Pharmacy Records  aspirin  EC 81 MG tablet [id number] Yes Take 1 tablet (81 mg total) by mouth daily. Swallow whole. Awanda City, MD  Active   Calcium  Carbonate-Simethicone  750-80 MG CHEW [id number] Yes Chew 1 tablet by mouth as needed (gas).  [provider]  Active Self, Pharmacy Records  Cholecalciferol  (VITAMIN D  PO) [id number] Yes Take 1,000 Units  by mouth daily. [provider]  Active Self, Pharmacy Records  diltiazem  (CARDIZEM  CD) 120 MG 24 hr capsule [id number] Yes Take 1 capsule (120 mg total) by mouth daily. Awanda City, MD  Active   empagliflozin  (JARDIANCE ) 10 MG TABS tablet [id number] Yes Take 1 tablet (10 mg total) by mouth daily before breakfast. Donette City A, FNP  Active   furosemide  (LASIX ) 20 MG tablet [id number] Yes Take 2 tablets by mouth once daily Dugal, Tabitha, FNP  Active Self, Pharmacy Records  glipiZIDE  (GLUCOTROL ) 5 MG tablet [id number] Yes TAKE 1 TABLET BY MOUTH TWICE DAILY BEFORE A MEAL Corwin Antu, FNP  Active Self, Pharmacy Records  glucose blood test strip [id number] Yes Use as instructed Corwin Antu, FNP  Active Self, Pharmacy Records  insulin  aspart (NOVOLOG ) 100 UNIT/ML injection [id number] Yes Inject 0-20 Units into the skin 3 (three) times daily with meals. Give while on steroid. CBG 70 - 120: 0 units CBG 121 - 150: 3 units CBG 151 - 200: 4 units CBG 201 - 250: 7 units CBG 251 - 300: 11 units CBG 301 - 350: 15 units CBG 351 - 400: 20 units Awanda City, MD  Active   insulin  glargine-yfgn (SEMGLEE ) 100 UNIT/ML injection [id number] Yes Inject 0.1 mLs (10 Units total) into the skin daily. Awanda City, MD  Active   irbesartan  (AVAPRO ) 300 MG tablet [id number] Yes Take 300 mg by mouth daily. [provider]  Active   Lancet Device MISC [id number] Yes 1 Lancet by Does not apply route daily as needed. Corwin Antu, FNP  Active Self, Pharmacy Records  lidocaine  (LIDODERM ) 5 % [id number] Yes Place 3 patches onto the skin daily. To right knee and both ankles Awanda City, MD  Active   meloxicam  (MOBIC ) 7.5 MG tablet [id number] Yes Take 1 tablet by mouth once daily Dugal, Tabitha, FNP  Active Self, Pharmacy Records  metoprolol  tartrate (LOPRESSOR ) 25 MG tablet [id number]  Take 25 mg by mouth daily in the afternoon.  Patient not taking: Reported on 04/05/2024   [provider]  Active   Multiple  Vitamin (MULTIVITAMIN WITH MINERALS) TABS tablet [id number] Yes Take 1 tablet by mouth daily. Awanda City, MD  Active   OXYGEN  [id number] Yes Inhale 2 L into the lungs continuous.  [provider]  Active Self, Pharmacy Records  pravastatin  (PRAVACHOL ) 40 MG tablet [id number] Yes Take 1 tablet (40 mg total) by mouth daily. Corwin Antu, FNP  Active Self, Pharmacy Records  pregabalin  (LYRICA ) 50 MG capsule [id number] Yes Take 1 capsule (50 mg total) by mouth 2 (two) times daily. Awanda City, MD  Active   tacrolimus (PROTOPIC) 0.1 % ointment [id number] Yes Apply topically 2 (two) times daily. 2 weeks [provider]  Active Self, Pharmacy Records  tamsulosin  (FLOMAX ) 0.4 MG CAPS capsule [id number] Yes Take 1 capsule by mouth at bedtime Dugal, Tabitha, FNP  Active Self, Pharmacy Records  traMADol  (ULTRAM ) 50 MG tablet [id number] Yes Take 1 tablet (50 mg total) by mouth every 6 (six) hours as needed for moderate pain (pain score 4-6). Awanda City, MD  Active             Home Care and Equipment/Supplies: Were Home Health Services Ordered?: NA Any new equipment or medical supplies ordered?: NA  Functional Questionnaire: Do you need assistance with bathing/showering or dressing?: No Do you need assistance with meal preparation?: No Do you need assistance with eating?: No Do you have difficulty maintaining continence: No Do you need assistance with getting out of bed/getting out of a chair/moving?: No Do you have difficulty managing or taking your medications?: No  Follow up appointments reviewed: PCP Follow-up appointment confirmed?: Yes Date of PCP follow-up appointment?: 04/06/24 Follow-up Provider: Antu Corwin, FNP Specialist Hospital Follow-up appointment confirmed?: Yes Date of Specialist follow-up appointment?: 04/23/24 Follow-Up Specialty Provider:: City Class,  FNP Do you need transportation to your follow-up appointment?: No Do you understand care options if your  condition(s) worsen?: Yes-patient verbalized understanding    SIGNATURE Avelina Essex, CMA (AAMA)  [facility] [phone]

## 2024-04-05 NOTE — Addendum Note (Signed)
 Addended by: CORWIN ANTU on: 04/05/2024 03:51 PM   Modules accepted: Orders

## 2024-04-05 NOTE — Telephone Encounter (Signed)
 NOTED

## 2024-04-05 NOTE — Telephone Encounter (Signed)
 Pt needs to really be seen in office. I am placing an urgent referral to a team that might be able to help with transportation to the clinic. I really would prefer to assess him in person considering what he was in the hospital for.

## 2024-04-05 NOTE — Telephone Encounter (Signed)
 Called and spoke with patients wife, she stated she called back and rescheduled appt to 04/27/24. Advised her that urgent referral was placed to help with transporting. Asked her if the transportation service would assist would they be willing to move appt up. They agreed, moved appt to 04/09/24 And advised them they would receive a call from the team regarding transportation

## 2024-04-05 NOTE — Telephone Encounter (Signed)
 Please advise if we may change this to a virtual visit  Copied from CRM [id number]. Topic: Appointments - Scheduling Inquiry for Clinic >> Apr 05, 2024  8:41 AM Treva T wrote: Reason for CRM: Call received from spouse of patient, Alfred Carney (DPR verified). Calling requesting  change in person appointment to a virtual/video visit, due to patient mobility difficulty.   Patient appointment is scheduled for in person visit on 04/06/24.   Patient spouse can be reached at [phone] to reschedule as visit as virtual/video visit.   Thank you

## 2024-04-06 ENCOUNTER — Telehealth: Payer: Self-pay | Admitting: *Deleted

## 2024-04-06 ENCOUNTER — Inpatient Hospital Stay: Admitting: Family

## 2024-04-06 ENCOUNTER — Telehealth: Payer: Self-pay

## 2024-04-06 NOTE — Progress Notes (Signed)
 Complex Care Management Note Care Guide Note  04/06/2024 Name: Ladarious Kresse MRN: [id number] DOB: January 03, 1951  Reynolds Kittel is a 73 y.o. year old male who is a primary care patient of Corwin Antu, FNP . The community resource team was consulted for assistance with Transportation Needs   SDOH screenings and interventions completed:  Yes  Social Drivers of Health From This Encounter   Transportation Needs: No Transportation Needs (04/06/2024)   PRAPARE - Administrator, Civil Service (Medical): No    Lack of Transportation (Non-Medical): No    SDOH Interventions Today    Flowsheet Row Most Recent Value  SDOH Interventions   Transportation Interventions Intervention Not Indicated  [patient needs a ride tomorrow there is no way to schedule that  will mail information on community transportation for future needs]     Care guide performed the following interventions: Patient provided with information about care guide support team and interviewed to confirm resource needs.  Follow Up Plan:  No further follow up planned at this time. The patient has been provided with needed resources.  Encounter Outcome:  Patient Visit Completed  Neomi Laidler Greenauer-Moran  [facility] HealthPopulation Health Care Guide  Direct Dial:[phone] Fax:[phone] Website: [email or web address]

## 2024-04-06 NOTE — Progress Notes (Signed)
 Complex Care Management Note  Care Guide Note 04/06/2024 Name: Alfred Carney MRN: [id number] DOB: Mar 31, 1951  Alfred Carney is a 73 y.o. year old male who sees Corwin Antu, FNP for primary care. I reached out to [name] by phone today to offer complex care management services.  [name] was given information about Complex Care Management services today including:   The Complex Care Management services include support from the care team which includes your Nurse Care Manager, Clinical Social Worker, or Pharmacist.  The Complex Care Management team is here to help remove barriers to the health concerns and goals most important to you. Complex Care Management services are voluntary, and the patient may decline or stop services at any time by request to their care team member.   Complex Care Management Consent Status: Patient did not agree to participate in complex care management services at this time.  Follow up plan:  Patient will follow up with PCP.  Encounter Outcome:  Patient Refused  Dreama Agent [facility] Health Care Management Assistant Direct Dial: [phone]  Fax: [phone]

## 2024-04-07 ENCOUNTER — Telehealth: Payer: Self-pay | Admitting: Family

## 2024-04-07 NOTE — Telephone Encounter (Signed)
 Will d/w pt at office visit f/u as this is harmful to his current state and can lead to complications

## 2024-04-07 NOTE — Telephone Encounter (Signed)
 Copied from CRM #[id number]. Topic: Clinical - Medical Advice >> Apr 07, 2024  2:15 PM Shereese L wrote: Reason for CRM: IDA A. [facility] home[facility] patient is refusing physical therapy and nurses visit and wanted to adv the office

## 2024-04-07 NOTE — Telephone Encounter (Signed)
 Noted and will route message to make Tabitha aware.

## 2024-04-09 ENCOUNTER — Encounter: Payer: Self-pay | Admitting: Family

## 2024-04-09 ENCOUNTER — Telehealth: Payer: Self-pay

## 2024-04-09 ENCOUNTER — Other Ambulatory Visit: Payer: Self-pay

## 2024-04-09 ENCOUNTER — Other Ambulatory Visit (HOSPITAL_COMMUNITY): Payer: Self-pay

## 2024-04-09 ENCOUNTER — Ambulatory Visit: Admitting: Family

## 2024-04-09 ENCOUNTER — Ambulatory Visit (INDEPENDENT_AMBULATORY_CARE_PROVIDER_SITE_OTHER)
Admission: RE | Admit: 2024-04-09 | Discharge: 2024-04-09 | Disposition: A | Discharge status: Home / Self Care | Source: Ambulatory Visit | Attending: Family | Admitting: Family

## 2024-04-09 VITALS — BP 136/80 | HR 76 | Temp 98.0°F

## 2024-04-09 DIAGNOSIS — Z6841 Body Mass Index (BMI) 40.0 and over, adult: Secondary | ICD-10-CM

## 2024-04-09 DIAGNOSIS — E66813 Obesity, class 3: Secondary | ICD-10-CM | POA: Diagnosis not present

## 2024-04-09 DIAGNOSIS — Z794 Long term (current) use of insulin: Secondary | ICD-10-CM | POA: Insufficient documentation

## 2024-04-09 DIAGNOSIS — R531 Weakness: Secondary | ICD-10-CM | POA: Insufficient documentation

## 2024-04-09 DIAGNOSIS — N182 Chronic kidney disease, stage 2 (mild): Secondary | ICD-10-CM

## 2024-04-09 DIAGNOSIS — E11649 Type 2 diabetes mellitus with hypoglycemia without coma: Secondary | ICD-10-CM | POA: Diagnosis not present

## 2024-04-09 DIAGNOSIS — E1159 Type 2 diabetes mellitus with other circulatory complications: Secondary | ICD-10-CM

## 2024-04-09 DIAGNOSIS — R5381 Other malaise: Secondary | ICD-10-CM | POA: Insufficient documentation

## 2024-04-09 DIAGNOSIS — I152 Hypertension secondary to endocrine disorders: Secondary | ICD-10-CM

## 2024-04-09 DIAGNOSIS — E871 Hypo-osmolality and hyponatremia: Secondary | ICD-10-CM | POA: Diagnosis not present

## 2024-04-09 DIAGNOSIS — E1122 Type 2 diabetes mellitus with diabetic chronic kidney disease: Secondary | ICD-10-CM | POA: Diagnosis not present

## 2024-04-09 DIAGNOSIS — R600 Localized edema: Secondary | ICD-10-CM

## 2024-04-09 DIAGNOSIS — R0609 Other forms of dyspnea: Secondary | ICD-10-CM | POA: Diagnosis not present

## 2024-04-09 DIAGNOSIS — D72829 Elevated white blood cell count, unspecified: Secondary | ICD-10-CM | POA: Diagnosis not present

## 2024-04-09 DIAGNOSIS — Z7984 Long term (current) use of oral hypoglycemic drugs: Secondary | ICD-10-CM

## 2024-04-09 DIAGNOSIS — I5033 Acute on chronic diastolic (congestive) heart failure: Secondary | ICD-10-CM

## 2024-04-09 LAB — BASIC METABOLIC PANEL WITH GFR
BUN: 33 mg/dL — ABNORMAL HIGH (ref 6–23)
CO2: 31 meq/L (ref 19–32)
Calcium: 9 mg/dL (ref 8.4–10.5)
Chloride: 97 meq/L (ref 96–112)
Creatinine, Ser: 2.07 mg/dL — ABNORMAL HIGH (ref 0.40–1.50)
GFR: 31.01 mL/min — ABNORMAL LOW (ref >60.00)
Glucose, Bld: 148 mg/dL — ABNORMAL HIGH (ref 70–99)
Potassium: 4.1 meq/L (ref 3.5–5.1)
Sodium: 142 meq/L (ref 135–145)

## 2024-04-09 LAB — CBC WITH DIFFERENTIAL/PLATELET
Basophils Absolute: 0 K/uL (ref 0.0–0.1)
Basophils Relative: 0.4 % (ref 0.0–3.0)
Eosinophils Absolute: 0.1 K/uL (ref 0.0–0.7)
Eosinophils Relative: 0.9 % (ref 0.0–5.0)
HCT: 40 % (ref 39.0–52.0)
Hemoglobin: 12.9 g/dL — ABNORMAL LOW (ref 13.0–17.0)
Lymphocytes Relative: 29.4 % (ref 12.0–46.0)
Lymphs Abs: 2.8 K/uL (ref 0.7–4.0)
MCHC: 32.3 g/dL (ref 30.0–36.0)
MCV: 89.3 fl (ref 78.0–100.0)
Monocytes Absolute: 0.9 K/uL (ref 0.1–1.0)
Monocytes Relative: 9.3 % (ref 3.0–12.0)
Neutro Abs: 5.7 K/uL (ref 1.4–7.7)
Neutrophils Relative %: 60 % (ref 43.0–77.0)
Platelets: 207 K/uL (ref 150.0–400.0)
RBC: 4.48 Mil/uL (ref 4.22–5.81)
RDW: 17.5 % — ABNORMAL HIGH (ref 11.5–15.5)
WBC: 9.5 K/uL (ref 4.0–10.5)

## 2024-04-09 MED ORDER — FREESTYLE LIBRE 3 PLUS SENSOR MISC: 1 refills | Status: DC

## 2024-04-09 NOTE — Assessment & Plan Note (Signed)
 Stable

## 2024-04-09 NOTE — Assessment & Plan Note (Signed)
 stressed need for PT OT  Wife to call and reinitiate with company that came out initially

## 2024-04-09 NOTE — Assessment & Plan Note (Addendum)
 Cont furosemide  40 mg once daily.  Fluid restriction < 60 L a day however advised pt to also ensure drinking enough water to reach close to this goal.  Decreased lung expansion on physical exam repeat cxr today  Stressed to pt need for daily weights and report to cardiology > 2 pound weight gain x one day

## 2024-04-09 NOTE — Telephone Encounter (Signed)
 Appt made. NFN

## 2024-04-09 NOTE — Telephone Encounter (Signed)
 Pt needs appt with sleep medicine for OSA

## 2024-04-09 NOTE — Assessment & Plan Note (Signed)
 Repeat bmp today  Likely in r/t CHF exacerbation prior  Monitor sodium intake fluid restriction < 2L

## 2024-04-09 NOTE — Progress Notes (Signed)
 Complex Care Management Note  Care Guide Note 04/09/2024 Name: Emran Molzahn MRN: [id number] DOB: March 13, 1951  Caston Coopersmith is a 73 y.o. year old male who sees Corwin Antu, FNP for primary care. I reached out to [name] by phone today to offer complex care management services.  Mr. Mundis was given information about Complex Care Management services today including:   The Complex Care Management services include support from the care team which includes your Nurse Care Manager, Clinical Social Worker, or Pharmacist.  The Complex Care Management team is here to help remove barriers to the health concerns and goals most important to you. Complex Care Management services are voluntary, and the patient may decline or stop services at any time by request to their care team member.   Complex Care Management Consent Status: Patient agreed to services and verbal consent obtained.   Follow up plan:  Telephone appointment with complex care management team member scheduled for:  04/14/24 at 2:30 p.m.   Encounter Outcome:  Patient Scheduled  Dream[facility]  [facility], [facility] Care Management Assistant Direct Dial: [phone]  Fax: [phone]

## 2024-04-09 NOTE — Telephone Encounter (Signed)
 Pharmacy Patient Advocate Encounter   Received notification from CoverMyMeds that prior authorization for Free Style Libre 3 Plus sensor is required/requested.   Insurance verification completed.   The patient is insured through [employer] [facility] .   Per test claim: PA required; PA submitted to above mentioned insurance via CoverMyMeds Key/confirmation #/EOC [facility] Status is pending

## 2024-04-09 NOTE — Assessment & Plan Note (Signed)
 Uspect pt with hypoglycemic epsiodes Stop gargline 10 units cont with glipizide  5 mg twice daily, jardiance  10 mg once daily Sending pt home with libre sensor 3+ to monitor blood sugars continuously  During episodes of feeling 'woozy' and lightheaded monitor sugar and also blood pressure and report back.  Referral to pharmacist as well to assist with medication assitance and treatment plan.  Consider decrease in glipizide  and increase jardiance  25 mg if hypoglycemic events continue. Advised to not skip meals and eat every 2-3 hours to maintain stable sugars.

## 2024-04-09 NOTE — Patient Instructions (Signed)
 Call back physical and OT to reinstate the orders.

## 2024-04-09 NOTE — Assessment & Plan Note (Signed)
 Cbvc ordered pending results.

## 2024-04-09 NOTE — Progress Notes (Signed)
 Established Patient Office Visit  Subjective:      CC:  Chief Complaint  Patient presents with   Hospitalization Follow-up    HPI: Alfred Carney is a 73 y.o. male presenting on 04/09/2024 for Hospitalization Follow-up  Discharged from hospital 7/8 admitted into rehab, discharged from rehab 20th.  Admitted to hospital 6/26 for c/o weakness x 10 days. While inpatient initial EKG with afib , then repeated and with PAC with sinus tachycardia. Per not favored to be r/t untreated sleep apnea. Echo EF 55-60%. Started on and transitioned to cardizem  120 mg once daily , given IV lasix  x 2 days with improvement in pedal edema. Discharged on 40 mg lasix  once daily. Suspected UTI given rochephin x 3 doses. Urine culture however came back neg. O2 remained stable in hospital at baseline 2L.   Discharged with PT OT rehab as pt very deconditioned per note.he was discharged from rehab July 20th and while at [facility] discharged with PT OT for continued strengthening and nursing for med management DME and given standard wheelchair. He however turned away OT PT   Meds discharged was one dose of semglee  10 unit [place] of which he resumed just yesterday.   Discharged inpatient to rehab   Cardiology appt f/u 7/11 with Ellouise Class FNP for f/u CNF as BNP had been elevated in hospital at 116.1, NCIM with preserved EF, suspected to be due to untreated OSA PAT . Order was written for daily weights at facility and to report back with > 2 pound weight gain x one day. Advised to continue furosemide  40 mg once daily, meotprolol 25 mg daily and irbesartan  300 mg daily.  EKG in their office NSR.advised to f/u 4 weeks.   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 Xray right knee as with pain and possible gout flare pt consulted with ortho Dr. Edie inpatient and pain was improved with inpatient steroid.   CBC 7/7 elevated white blood cells and plts likely inflammatory, BMP with slight decline kidney function increase creatinine and low sodium.   Dm2:  Per note with cardiology if glucose was to become too low would like us  to consider stopping glipizide  prior to stopping GLP 1. Was given mealtime insulin  inpatient and was started on glargine 10 unit daily. Advised to monitor SSI TID as pt was on steroid and advised to resume home glipizide  upon discharge. He is still taking jardiance  10 mg once daily and glipizide  5 mg twice daily   COPD cont O2 2 L   OSA CPAP but not wearing it advised compliance during the hospital stay.   Nilsa was feeling weird and he saw 'stars' with his eye sight and he checked his glucose and it was 154, he then gave himself insulin  and he felt a bit better. Yesterday also when he was taking his shower and was about to get out and was working on dressing himself and then he felt so weak that he couldn't do anything else. He isn't always eating but his daughter is staying with them and she is trying to get him to eat more. He does have three bottles of water by him at his chair and he sips from them throughout the day. He and his family do state that he has been very fatigued and weak since discharge from the hospital (of which this was also his baseline inpt) . He  does admit he has not been weighing himself daily.   Wt Readings from Last 3 Encounters:  03/26/24 296 lb (134.3 kg)  03/23/24 (P) 271 lb 9.7 oz (123.2 kg)  12/11/23 (!) 324 lb 3.2 oz (147.1 kg)      Social history:  Relevant past medical, surgical, family and social history reviewed and updated as indicated. Interim medical history since our last visit reviewed.  Allergies and medications reviewed and updated.  DATA REVIEWED: CHART IN EPIC     ROS: Negative unless specifically indicated above in HPI.    Current Outpatient Medications:    albuterol  (VENTOLIN  HFA) 108 (90 Base) MCG/ACT inhaler,  Inhale 2 puffs into the lungs every 4 (four) hours as needed for wheezing or shortness of breath., Disp: 8 g, Rfl: 2   allopurinol  (ZYLOPRIM ) 100 MG tablet, Take 2 tablets (200 mg total) by mouth daily., Disp: 180 tablet, Rfl: 3   aspirin  EC 81 MG tablet, Take 1 tablet (81 mg total) by mouth daily. Swallow whole., Disp: , Rfl:    Calcium  Carbonate-Simethicone  750-80 MG CHEW, Chew 1 tablet by mouth as needed (gas). , Disp: , Rfl:    Cholecalciferol  (VITAMIN D  PO), Take 1,000 Units by mouth daily., Disp: , Rfl:    Continuous Glucose Sensor (FREESTYLE LIBRE 3 PLUS SENSOR) MISC, Use to check blood sugar continuously. Change sensor every 15 days., Disp: 6 each, Rfl: 1   diltiazem  (CARDIZEM  CD) 120 MG 24 hr capsule, Take 1 capsule (120 mg total) by mouth daily., Disp: , Rfl:    empagliflozin  (JARDIANCE ) 10 MG TABS tablet, Take 1 tablet (10 mg total) by mouth daily before breakfast., Disp: 30 tablet, Rfl: 5   furosemide  (LASIX ) 20 MG tablet, Take 2 tablets by mouth once daily, Disp: 60 tablet, Rfl: 2   glipiZIDE  (GLUCOTROL ) 5 MG tablet, TAKE 1 TABLET BY MOUTH TWICE DAILY BEFORE A MEAL, Disp: 180 tablet, Rfl: 3   glucose blood test strip, Use as instructed, Disp: 100 each, Rfl: 12   insulin  aspart (NOVOLOG ) 100 UNIT/ML injection, Inject 0-20 Units into the skin 3 (three) times daily with meals. Give while on steroid. CBG 70 - 120: 0 units CBG 121 - 150: 3 units CBG 151 - 200: 4 units CBG 201 - 250: 7 units CBG 251 - 300: 11 units CBG 301 - 350: 15 units CBG 351 - 400: 20 units, Disp: , Rfl:    insulin  glargine-yfgn (SEMGLEE ) 100 UNIT/ML injection, Inject 0.1 mLs (10 Units total) into the skin daily., Disp: , Rfl:    irbesartan  (AVAPRO ) 300 MG tablet, Take 300 mg by mouth daily., Disp: , Rfl:    Lancet Device MISC, 1 Lancet by Does not apply route daily as needed., Disp: 100 each, Rfl: 3   lidocaine  (LIDODERM ) 5 %, Place 3 patches onto the skin daily. To right knee and both ankles, Disp: , Rfl:    meloxicam   (MOBIC ) 7.5 MG tablet, Take 1 tablet by mouth once daily, Disp: 90 tablet, Rfl: 3   Multiple Vitamin (MULTIVITAMIN WITH MINERALS) TABS tablet, Take 1 tablet by mouth daily., Disp: , Rfl:    OXYGEN , Inhale 2 L into the lungs continuous. , Disp: , Rfl:    pravastatin  (PRAVACHOL ) 40 MG tablet, Take 1 tablet (40 mg total) by mouth daily., Disp: 90 tablet, Rfl: 3   pregabalin  (LYRICA ) 50 MG capsule, Take 1 capsule (50 mg total) by mouth 2 (two) times daily., Disp: , Rfl:    tacrolimus (PROTOPIC) 0.1 %  ointment, Apply topically 2 (two) times daily. 2 weeks, Disp: , Rfl:    tamsulosin  (FLOMAX ) 0.4 MG CAPS capsule, Take 1 capsule by mouth at bedtime, Disp: 90 capsule, Rfl: 1   traMADol  (ULTRAM ) 50 MG tablet, Take 1 tablet (50 mg total) by mouth every 6 (six) hours as needed for moderate pain (pain score 4-6)., Disp: 20 tablet, Rfl: 0   metoprolol  tartrate (LOPRESSOR ) 25 MG tablet, Take 25 mg by mouth daily in the afternoon. (Patient not taking: Reported on 04/09/2024), Disp: , Rfl:       Objective:    BP 136/80 (BP Location: Left Arm, Patient Position: Sitting, Cuff Size: Large)   Pulse 76   Temp 98 F (36.7 C) (Temporal)   SpO2 96% Comment: 2L pulse  Wt Readings from Last 3 Encounters:  03/26/24 296 lb (134.3 kg)  03/23/24 (P) 271 lb 9.7 oz (123.2 kg)  12/11/23 (!) 324 lb 3.2 oz (147.1 kg)    Physical Exam Constitutional:      General: He is not in acute distress.    Appearance: Normal appearance. He is normal weight. He is not ill-appearing, toxic-appearing or diaphoretic.  Cardiovascular:     Rate and Rhythm: Normal rate.  Pulmonary:     Effort: Pulmonary effort is normal.     Breath sounds: Decreased air movement present. Examination of the right-upper field reveals decreased breath sounds. Examination of the left-upper field reveals decreased breath sounds. Examination of the left-lower field reveals decreased breath sounds. Decreased breath sounds present. No wheezing.   Musculoskeletal:        General: Normal range of motion.     Right lower leg: 1+ Edema present.     Left lower leg: 1+ Edema present.  Neurological:     General: No focal deficit present.     Mental Status: He is alert and oriented to person, place, and time. Mental status is at baseline.  Psychiatric:        Mood and Affect: Mood normal.        Behavior: Behavior normal.        Thought Content: Thought content normal.        Judgment: Judgment normal.    Pt tired, often setting head down but easily arousable not lethargic       Assessment & Plan:  Hypertension associated with diabetes (HCC)  Class 3 severe obesity due to excess calories with serious comorbidity and body mass index (BMI) of 45.0 to 49.9 in adult  Type 2 diabetes mellitus with stage 2 chronic kidney disease, without long-term current use of insulin  (HCC) -     AMB Referral VBCI Care Management  Type 2 diabetes mellitus with hypoglycemia without coma, with long-term current use of insulin  (HCC) Assessment & Plan: Uspect pt with hypoglycemic epsiodes Stop gargline 10 units cont with glipizide  5 mg twice daily, jardiance  10 mg once daily Sending pt home with libre sensor 3+ to monitor blood sugars continuously  During episodes of feeling 'woozy' and lightheaded monitor sugar and also blood pressure and report back.  Referral to pharmacist as well to assist with medication assitance and treatment plan.  Consider decrease in glipizide  and increase jardiance  25 mg if hypoglycemic events continue. Advised to not skip meals and eat every 2-3 hours to maintain stable sugars.      Orders: -     FreeStyle Libre 3 Plus Sensor; Use to check blood sugar continuously. Change sensor every 15 days.  Dispense: 6 each; Refill: 1 -  AMB Referral VBCI Care Management  Hyponatremia Assessment & Plan: Repeat bmp today  Likely in r/t CHF exacerbation prior  Monitor sodium intake fluid restriction < 2L   Orders: -      Basic metabolic panel with GFR  Leukocytosis, unspecified type Assessment & Plan: Cbvc ordered pending results.    Orders: -     CBC with Differential/Platelet  DOE (dyspnea on exertion) -     DG Chest 2 View; Future  Acute on chronic diastolic congestive heart failure (HCC) Assessment & Plan: Cont furosemide  40 mg once daily.  Fluid restriction < 60 L a day however advised pt to also ensure drinking enough water to reach close to this goal.  Decreased lung expansion on physical exam repeat cxr today  Stressed to pt need for daily weights and report to cardiology > 2 pound weight gain x one day   Generalized weakness  Physical deconditioning Assessment & Plan: stressed need for PT OT  Wife to call and reinitiate with company that came out initially   Pedal edema Assessment & Plan: Stable       Stop insulin  dosage as I suspect this is what is causing pt to feel awful.  Could consider lower dose of lyrica  as this also may be contributing to lethargy.  Reinitiate physical therapy and OT as pt had turned them away.       Return in about 1 month (around 05/10/2024) for f/u fatigue .  Ginger Patrick, MSN, APRN, FNP-C [facility]

## 2024-04-12 ENCOUNTER — Other Ambulatory Visit (HOSPITAL_COMMUNITY): Payer: Self-pay

## 2024-04-12 ENCOUNTER — Ambulatory Visit: Payer: Self-pay | Admitting: Family

## 2024-04-12 DIAGNOSIS — D649 Anemia, unspecified: Secondary | ICD-10-CM

## 2024-04-12 DIAGNOSIS — N3000 Acute cystitis without hematuria: Secondary | ICD-10-CM

## 2024-04-12 DIAGNOSIS — R944 Abnormal results of kidney function studies: Secondary | ICD-10-CM

## 2024-04-12 MED ORDER — AMOXICILLIN-POT CLAVULANATE 875-125 MG PO TABS: 1.0000 | ORAL_TABLET | Freq: Two times a day (BID) | ORAL | 0 refills | Status: DC

## 2024-04-12 NOTE — Telephone Encounter (Signed)
 Pharmacy Patient Advocate Encounter  Received notification from [employer] that Prior Authorization for [facility] 3 Plus sensors has been APPROVED from 04/09/24 to 09/15/24. Unable to obtain price due to refill too soon rejection, last fill date 04/10/24 next available fill date10/2/25   PA #/Case ID/Reference #: [id number]

## 2024-04-13 ENCOUNTER — Other Ambulatory Visit: Payer: Self-pay | Admitting: Radiology

## 2024-04-13 DIAGNOSIS — R944 Abnormal results of kidney function studies: Secondary | ICD-10-CM

## 2024-04-14 ENCOUNTER — Other Ambulatory Visit

## 2024-04-14 ENCOUNTER — Ambulatory Visit: Payer: Self-pay | Admitting: Family

## 2024-04-14 LAB — URINALYSIS, ROUTINE W REFLEX MICROSCOPIC
Bilirubin Urine: NEGATIVE
Hgb urine dipstick: NEGATIVE
Ketones, ur: NEGATIVE
Leukocytes,Ua: NEGATIVE
Nitrite: NEGATIVE
Protein, ur: NEGATIVE
Specific Gravity, Urine: 1.013 (ref 1.001–1.035)
pH: 5.5 (ref 5.0–8.0)

## 2024-04-14 LAB — URINE CULTURE
MICRO NUMBER:: 16759448
Result:: NO GROWTH
SPECIMEN QUALITY:: ADEQUATE

## 2024-04-14 NOTE — Progress Notes (Deleted)
 04/14/2024 Name: Alfred Carney MRN: [id number] DOB: 1951/09/06  Subjective  No chief complaint on file.   Reason for visit: ?  Alfred Carney is a 73 y.o. male with a history of diabetes (type 2), who presents today for an initial diabetes pharmacotherapy visit.? Pertinent PMH also includes CAD, PAD, HFpEF, HTN, COPD, HLD, CKD, obesity, psoriasis.   New addition of insulin  after discharge from hospital  Taking glipzide, gargline insulin  10 units, jardiance    Known DM Complications: {Blank multiple:19197::retinopathy,peripheral neuropathy,nephropathy,hypoglycemia unawareness,severe hypoglycemia,DKA,autonomic neuropathy,gastroparesis,amputation,significant fear of hypoglycemia,no known complications}   Care Team: Primary Care Provider: Corwin Antu, FNP   Date of Last Diabetes Related Visit: {LZ with PCP with pharmacist:31532}  Recent Summary of Change: ***  Medication Access/Adherence: Prescription drug coverage: Payor: Advertising copywriter MEDICARE / Plan: [facility] MEDICARE / Product Type: *No Product type* / .  - Reports that all medications are *** affordable.  - Current Patient Assistance: None*** - Medication Adherence: Patient denies*** missing doses of their medication.    History of Present Illness: ?  Hospitalization 6/26 - 7/8  --pt was receiving mealtime insulin  10u TID and resistant SSI scale during hospitalization.  A1c returned worsened at 7.9, so pt was started on glargine 10u daily.   --cont BG check and SSI TID while pt is on steroid. --resume home glipizide  after discharge.  Since Last visit: Patient reports implementing*** plan from last visit. Denies adverse effects with titration of ***.   Reported DM Regimen: ?  Jardiance  10 mg daily Semglee  *** units daily Novolog  *** units ***    DM medications tried in the past:?  *** (***) *** (***) *** (***)  SMBG: *** ?  ***   Hypo/Hyperglycemia: ?  Symptoms of hypoglycemia since last visit:?  {Blank multiple:19197::yes,no,n/a,***}  Symptoms of hyperglycemia since last visit:? {Blank multiple:19197::yes,no,n/a,***} - {symptoms; hyperglycemia:17903}  Reported Diet: Patient typically eats *** meals per day.  Breakfast: *** Lunch: *** Dinner: *** Snacks: *** Beverages: ***  Exercise: ***  DM Prevention:  Statin: {Blank single:19197::***,Taking,Not taking,Intolerant to,Declines}; {Blank single:19197::low intensity,moderate intensity,high intensity,n/a}.?  History of chronic kidney disease? {Blank single:19197::yes,no} History of albuminuria? {Blank single:19197::yes,no}, last UACR on *** = *** mg/g Lab Results  Component Value Date   MICRALBCREAT 14.1 11/18/2023   ACE/ARB - {Blank single:19197::***,Taking,Not taking,Intolerant to,Declines} ***; Urine MA/CR Ratio - {Blank single:19197::normal,elevated urinary albumin  excretion,n/a,***}.  Last eye exam: ***; {lzdmeyeexam:31121} Lab Results  Component Value Date   HMDIABEYEEXA No Retinopathy 08/07/2023   Last foot exam: 05/03/2021 Tobacco Use: ***  Tobacco Use: Medium Risk (04/09/2024)   Patient History    Smoking Tobacco Use: Former    Smokeless Tobacco Use: Never    Passive Exposure: Not on file   Immunizations:? Flu: {LZDUEUPTODATE:31033} (Last: ); Pneumococcal: {LZVAXpneumococcal:31032:::0} - {LZVAXpneumo RISKAge19-64:31093}; Shingrix: {LZDUEUPTODATE:31033} (Last: , ); Covid (***)  Cardiovascular Risk Reduction History of clinical ASCVD? {Blank single:19197::yes,no} The ASCVD Risk score (Arnett DK, et al., 2019) failed to calculate for the following reasons:   The valid total cholesterol range is 130 to 320 mg/dL History of heart failure? {Blank single:19197::yes,no} History of hyperlipidemia? {Blank single:19197::yes,no} Current BMI: *** kg/m2 (Ht *** in, Wt *** kg) Taking statin? {Blank single:19197::yes,no,intolerant,declines}; {Blank  single:19197::low intensity,moderate intensity,high intensity,n/a} (***) Taking aspirin ? {Blank single:19197::indicated (primary prevention),indicated (secondary prevention),not indicated,unclear if indicated}; {Blank single:19197::***,Taking,Not taking,Intolerant to,Declines}   Taking SGLT-2i? {Blank single:19197::yes,no} Taking GLP- 1 RA? {Blank single:19197::yes,no}      _______________________________________________  Objective    Review of Systems:? Limited in the setting of virtual visit *** Constitutional:? No  fever, chills or unintentional weight loss  Cardiovascular:? No chest pain or pressure, shortness of breath, dyspnea on exertion, orthopnea or LE edema  Pulmonary:? No cough or shortness of breath  GI:? No nausea, vomiting, constipation, diarrhea, abdominal pain, dyspepsia, change in bowel habits  Endocrine:? No polyuria, polyphagia or blurred vision    Physical Examination:  Vitals:  Wt Readings from Last 3 Encounters:  03/26/24 296 lb (134.3 kg)  03/23/24 (P) 271 lb 9.7 oz (123.2 kg)  12/11/23 (!) 324 lb 3.2 oz (147.1 kg)   BP Readings from Last 3 Encounters:  04/09/24 136/80  03/26/24 (!) 109/54  03/23/24 (!) 120/56   Pulse Readings from Last 3 Encounters:  04/09/24 76  03/26/24 91  03/23/24 75     Labs:?  Lab Results  Component Value Date   HGBA1C 7.9 (H) 03/22/2024   HGBA1C 7.0 (H) 11/18/2023   HGBA1C 6.4 05/20/2023   GLUCOSE 148 (H) 04/09/2024   MICRALBCREAT 14.1 11/18/2023   CREATININE 2.07 (H) 04/09/2024   CREATININE 1.31 (H) 03/22/2024   CREATININE 1.19 03/21/2024   GFR 31.01 (L) 04/09/2024   GFR 51.03 (L) 11/18/2023   GFR 68.12 05/20/2023    Lab Results  Component Value Date   CHOL 126 11/18/2023   LDLCALC 67 11/18/2023   LDLCALC 75 05/20/2023   LDLCALC 77 05/15/2022   HDL 38.90 (L) 11/18/2023   TRIG 103.0 11/18/2023   TRIG 110.0 05/20/2023   TRIG 87.0 05/15/2022   ALT 30 03/11/2024   ALT 29  11/18/2023   AST 37 03/11/2024   AST 18 11/18/2023      Chemistry      Component Value Date/Time   NA 142 04/09/2024 1107   K 4.1 04/09/2024 1107   CL 97 04/09/2024 1107   CO2 31 04/09/2024 1107   BUN 33 (H) 04/09/2024 1107   CREATININE 2.07 (H) 04/09/2024 1107   CREATININE 1.16 01/13/2015 0936      Component Value Date/Time   CALCIUM  9.0 04/09/2024 1107   ALKPHOS 89 03/11/2024 0818   AST 37 03/11/2024 0818   ALT 30 03/11/2024 0818   BILITOT 1.1 03/11/2024 0818       The ASCVD Risk score (Arnett DK, et al., 2019) failed to calculate for the following reasons:   The valid total cholesterol range is 130 to 320 mg/dL  Assessment and Plan:   1. Diabetes, {Blank single:19197::type 1,type 2,LADA,MODY,post-transplant}: {CHL Controlled/Uncontrolled:[phone]} per last A1c of ***% (***), {Increased/decreased/na:15831} from previous. Goal <7% without hypoglycemia.  {lzcgmsmbg:31042} data shows ***?. Current Regimen: *** Diet: *** Exercise: ***  HCM: {LZ HCM DM2:31604} Continue medications today without changes.  ***  Reviewed s/sx/tx hypoglycemia Future Consideration: GLP1-RA: *** SGLT2i: *** Metformin: {LZ AP metformin:31605}  SU: Not unreasonable, though defer given alternative options with lower risk of hypoglycemia.  TZD: Avoiding due to possible weight gain/increase in fracture risk. ***HF Insulin : Not unreasonable, though defer as last resort or as needed for severe hyperglycemia given risk hypoglycemia. Other safer options at this time.     2. HTN: {CHL Controlled/Uncontrolled:[phone]} based on last clinic BP of *** mmHg (***), goal <130/80 mmHg. Does not *** monitor BP at home. Denies lightheadedness, dizziness, SOB, CP, vision changes.  Current Regimen: *** Continue medications without changes.  BP Readings from Last 3 Encounters:  04/09/24 136/80  03/26/24 (!) 109/54  03/23/24 (!) 120/56     3. ASCVD (*** prevention): {CHL  Controlled/Uncontrolled:[phone]} on last lipid panel with LDL *** mg/dL, TG *** mg/dL (***). LDL  goal {ldl goal:31310}.  Key risk factors include: {cvs risk:31001} The ASCVD Risk score (Arnett DK, et al., 2019) failed to calculate for the following reasons:   The valid total cholesterol range is 130 to 320 mg/dL indicated patient is at *** risk.  PREVENT: 10-Yr CAD ***%, 30-Yr CAD ***% indicated patient is at *** risk.  (those without ASCVD, CHF), Age 30-79 (includes additional metabolic risk factors, BMI, GFR, UACR, no race) Current Regimen: *** mg daily Continue medications today without changes.  Lab Results  Component Value Date   LDLCALC 67 11/18/2023   LDLCALC 75 05/20/2023   TRIG 103.0 11/18/2023   TRIG 110.0 05/20/2023     4. Healthcare Maintenance:  Pneumococcal - Current status: ***  Shingles - Current status: *** Influenza - Current status: ***  Due to receive the following vaccines: {LZAPIMMUNIZATION:31028}   Follow Up Follow up with clinical pharmacist via *** in *** weeks Patient given direct line for questions regarding medication therapy  Future Appointments  Date Time Provider Department Center  04/14/2024  2:30 PM [facility] PHARMACIST [facility]  04/19/2024  2:15 PM Darlean Ozell NOVAK, MD [facility] None  04/23/2024  2:00 PM Donette Ellouise LABOR, FNP [facility] None  05/10/2024 11:40 AM Corwin Antu, FNP [facility]  05/19/2024  8:50 AM [facility] ANNUAL WELLNESS VISIT 1 [facility]  06/01/2024  2:00 PM Cobb, Comer GAILS, NP [facility] None    Manuelita FABIENE Kobs, PharmD Clinical Pharmacist [facility] [phone]

## 2024-04-15 ENCOUNTER — Other Ambulatory Visit (INDEPENDENT_AMBULATORY_CARE_PROVIDER_SITE_OTHER): Payer: Self-pay

## 2024-04-15 DIAGNOSIS — D649 Anemia, unspecified: Secondary | ICD-10-CM

## 2024-04-16 LAB — FECAL OCCULT BLOOD, IMMUNOCHEMICAL: Fecal Occult Bld: NEGATIVE

## 2024-04-19 ENCOUNTER — Telehealth: Payer: Self-pay

## 2024-04-19 ENCOUNTER — Other Ambulatory Visit: Payer: Self-pay | Admitting: Family

## 2024-04-19 ENCOUNTER — Ambulatory Visit: Admitting: Internal Medicine

## 2024-04-19 NOTE — Telephone Encounter (Signed)
 Copied from CRM #[id number]. Topic: General - Other >> Apr 19, 2024 12:42 PM Carlyon D wrote: Reason for CRM: Kaitlyn Calling from [facility] is calling in regards to an order that she faxed over 04/15/24. She is going to refax that order over shortly. Order # [id number].  Best contact for Kaitlyn [phone] Person and can leave VM Secure line.

## 2024-04-19 NOTE — Telephone Encounter (Signed)
 Copied from CRM [id number]. Topic: Clinical - Medication Refill >> Apr 19, 2024  9:50 AM [place] J wrote: Medication: traMADol  (ULTRAM ) 50 MG tablet  Has the patient contacted their pharmacy? Yes (Agent: If no, request that the patient contact the pharmacy for the refill. If patient does not wish to contact the pharmacy document the reason why and proceed with request.) (Agent: If yes, when and what did the pharmacy advise?) Pharmacy tried to request refill but the request was no received. This is the patient's preferred pharmacy:  [facility] [address] [place], [place] - [address] [place] [address] Phone: [phone] Fax: [phone]  Is this the correct pharmacy for this prescription? Yes If no, delete pharmacy and type the correct one.   Has the prescription been filled recently? No  Is the patient out of the medication? No  Has the patient been seen for an appointment in the last year OR does the patient have an upcoming appointment? Yes  Can we respond through MyChart? No  Agent: Please be advised that Rx refills may take up to 3 business days. We ask that you follow-up with your pharmacy.

## 2024-04-19 NOTE — Progress Notes (Signed)
 Care Guide Pharmacy Note  04/19/2024 Name: Alfred Carney MRN: [id number] DOB: 1951/09/16  Referred By: Corwin Antu, FNP Reason for referral: Complex Care Management and Call Attempt #1 (Successful outreach scheduled with PHARM D Manuelita PEDLAR)   [name] is a 73 y.o. year old male who is a primary care patient of Corwin Antu, FNP.  [name] was referred to the pharmacist for assistance related to: medication adherence  Successful contact was made with the patient to discuss pharmacy services including being ready for the pharmacist to call at least 5 minutes before the scheduled appointment time and to have medication bottles and any blood pressure readings ready for review. The patient agreed to meet with the pharmacist via telephone visit on (date/time). 04/23/24 @ 10 AM  Leotis Cloria Davene Salome Romell Ralph Valrie, [facility] Guide  Direct Dial: [phone]  Fax [phone]

## 2024-04-20 ENCOUNTER — Telehealth: Payer: Self-pay | Admitting: Family

## 2024-04-20 DIAGNOSIS — Z9181 History of falling: Secondary | ICD-10-CM

## 2024-04-20 DIAGNOSIS — Z9049 Acquired absence of other specified parts of digestive tract: Secondary | ICD-10-CM

## 2024-04-20 DIAGNOSIS — I4891 Unspecified atrial fibrillation: Secondary | ICD-10-CM

## 2024-04-20 DIAGNOSIS — J439 Emphysema, unspecified: Secondary | ICD-10-CM | POA: Diagnosis not present

## 2024-04-20 DIAGNOSIS — N400 Enlarged prostate without lower urinary tract symptoms: Secondary | ICD-10-CM

## 2024-04-20 DIAGNOSIS — M47816 Spondylosis without myelopathy or radiculopathy, lumbar region: Secondary | ICD-10-CM

## 2024-04-20 DIAGNOSIS — G4733 Obstructive sleep apnea (adult) (pediatric): Secondary | ICD-10-CM

## 2024-04-20 DIAGNOSIS — K219 Gastro-esophageal reflux disease without esophagitis: Secondary | ICD-10-CM

## 2024-04-20 DIAGNOSIS — Z7982 Long term (current) use of aspirin: Secondary | ICD-10-CM

## 2024-04-20 DIAGNOSIS — M51369 Other intervertebral disc degeneration, lumbar region without mention of lumbar back pain or lower extremity pain: Secondary | ICD-10-CM

## 2024-04-20 DIAGNOSIS — E1165 Type 2 diabetes mellitus with hyperglycemia: Secondary | ICD-10-CM

## 2024-04-20 DIAGNOSIS — M48061 Spinal stenosis, lumbar region without neurogenic claudication: Secondary | ICD-10-CM

## 2024-04-20 DIAGNOSIS — N183 Chronic kidney disease, stage 3 unspecified: Secondary | ICD-10-CM

## 2024-04-20 DIAGNOSIS — E1122 Type 2 diabetes mellitus with diabetic chronic kidney disease: Secondary | ICD-10-CM

## 2024-04-20 DIAGNOSIS — M1030 Gout due to renal impairment, unspecified site: Secondary | ICD-10-CM

## 2024-04-20 DIAGNOSIS — Z9981 Dependence on supplemental oxygen: Secondary | ICD-10-CM

## 2024-04-20 DIAGNOSIS — I5032 Chronic diastolic (congestive) heart failure: Secondary | ICD-10-CM | POA: Diagnosis not present

## 2024-04-20 DIAGNOSIS — Z96642 Presence of left artificial hip joint: Secondary | ICD-10-CM

## 2024-04-20 DIAGNOSIS — E1151 Type 2 diabetes mellitus with diabetic peripheral angiopathy without gangrene: Secondary | ICD-10-CM

## 2024-04-20 DIAGNOSIS — I130 Hypertensive heart and chronic kidney disease with heart failure and stage 1 through stage 4 chronic kidney disease, or unspecified chronic kidney disease: Secondary | ICD-10-CM | POA: Diagnosis not present

## 2024-04-20 DIAGNOSIS — Z6841 Body Mass Index (BMI) 40.0 and over, adult: Secondary | ICD-10-CM

## 2024-04-20 DIAGNOSIS — Z7984 Long term (current) use of oral hypoglycemic drugs: Secondary | ICD-10-CM

## 2024-04-20 DIAGNOSIS — E785 Hyperlipidemia, unspecified: Secondary | ICD-10-CM

## 2024-04-20 DIAGNOSIS — J9611 Chronic respiratory failure with hypoxia: Secondary | ICD-10-CM | POA: Diagnosis not present

## 2024-04-20 DIAGNOSIS — Z87891 Personal history of nicotine dependence: Secondary | ICD-10-CM

## 2024-04-20 DIAGNOSIS — M6281 Muscle weakness (generalized): Secondary | ICD-10-CM

## 2024-04-20 DIAGNOSIS — Z79899 Other long term (current) drug therapy: Secondary | ICD-10-CM

## 2024-04-20 DIAGNOSIS — D72829 Elevated white blood cell count, unspecified: Secondary | ICD-10-CM

## 2024-04-20 DIAGNOSIS — Z791 Long term (current) use of non-steroidal anti-inflammatories (NSAID): Secondary | ICD-10-CM

## 2024-04-20 DIAGNOSIS — I2510 Atherosclerotic heart disease of native coronary artery without angina pectoris: Secondary | ICD-10-CM

## 2024-04-20 DIAGNOSIS — Z9089 Acquired absence of other organs: Secondary | ICD-10-CM

## 2024-04-20 NOTE — Telephone Encounter (Signed)
 Copied from CRM #[id number]. Topic: General - Other >> Apr 20, 2024  2:03 PM Rosina BIRCH wrote: Reason for CRM: [facility] called stating the patient had a fall on sunday evening (8/3). Patient had no injuries but had some increase in pain from both of his legs from arthritis CB [phone]

## 2024-04-21 NOTE — Telephone Encounter (Signed)
 NOTED

## 2024-04-23 ENCOUNTER — Encounter: Admitting: Family

## 2024-04-23 ENCOUNTER — Other Ambulatory Visit

## 2024-04-23 NOTE — Progress Notes (Unsigned)
 04/23/2024 Name: Alfred Carney MRN: [id number] DOB: [birth year]  Subjective  No chief complaint on file.   Reason for visit: ?  Alfred Carney is a 73 y.o. male with a history of diabetes (type 2), who presents today for an initial diabetes pharmacotherapy visit.? Pertinent PMH also includes CAD, PAD, CHF, HTN, COPD, HLD, CKD, BPH, hx gout, obesity.  Known DM Complications: nephropathy, CAD, PAD, ***   Care Team: Primary Care Provider: Corwin Antu, FNP   Date of Last Diabetes Related Visit: with PCP on 04/09/24  Recent Summary of Change: 7/25: Stop gargline 10 units cont with glipizide  5 mg twice daily, jardiance  10 mg once daily. Start Libre 3+ 7/8: Discharge: SSI/basal while inpatient. On discharge, resume home DM meds. Semglee  10 u/d. SSI only while on steroid.   Medication Access/Adherence: Prescription drug coverage: Payor: Advertising copywriter MEDICARE / Plan: [facility] MEDICARE / Product Type: *No Product type* / .  - Reports that all medications are affordable.  - Current Patient Assistance: None. Lives in a household of 2 with estimated annual income of $49k.  - Medication Adherence: Patient denies missing doses of their medication.    Since Last visit / History of Present Illness: ?  Patient reports implementing plan from last visit.  Reports his first Libre sensor fel off early and wouldn't stay on so he has been using glucometer instead. Does confirm that he was able to get his Rx form [facility] for [place] 3+ which was covered.   Stopped Jardiance  and restarted metoprolol  (~1 week ago)  If gets lightheaded, takes extra glipizide . (Does not check sugar). Has only done this ~once per week).   Reported DM Regimen: ?  Glipizide  5 mg twice daily (sometimes forgets 2nd dose)   DM medications tried in the past:?  Jardiance  (nausea, diarrhea)  SMBG: glucometer ?(checks daily ~8 am, fasting) FBG: 90s Highest 196   7/24:  150 107 112 93 163 163 80 197 161 119  152 113 182 177   Blood pressure:  116/94, 64/48 89/54 132/72 82/55 113/76 119/73 95/65 103/64 123/65 119/77 140/78 112/81 120/72 121/63   Hypo/Hyperglycemia: ?  Symptoms of hypoglycemia since last visit:? {Blank multiple:19197::yes,no,n/a,***}  Symptoms of hyperglycemia since last visit:? {Blank multiple:19197::yes,no,n/a,***} - {symptoms; hyperglycemia:17903}  Reported Diet: Patient typically eats *** meals per day.  Breakfast: *** Lunch: *** Dinner: *** Snacks: *** Beverages: ***  Exercise: ***  DM Prevention:  Statin: Taking; moderate intensity.?  History of chronic kidney disease? yes History of albuminuria? {Blank single:19197::yes,no}, last UACR on 11/18/23 = 14.1 mg/g ACE/ARB - Taking irbesartan ; Urine MA/CR Ratio - At goal <30 mg/g.  Last eye exam: 08/07/23; No retinopathy present Last foot exam: 05/03/2021 Tobacco Use: Former smoker  Immunizations:? Flu: {LZDUEUPTODATE:31033} (Last: ); Pneumococcal: {LZVAXpneumococcal:31032:::0} - {LZVAXpneumo RISKAge19-64:31093}; Shingrix: {LZDUEUPTODATE:31033} (Last: , ); Covid (***)  Cardiovascular Risk Reduction History of clinical ASCVD? yes History of heart failure? yes History of hyperlipidemia? yes Current BMI: *** kg/m2 (Ht *** in, Wt *** kg) Taking statin? yes; moderate intensity (pravastatin  40mg ) Taking aspirin ? indicated (secondary prevention); Taking   Taking SGLT-2i? yes Taking GLP- 1 RA? no    _______________________________________________  Objective    Review of Systems:? Limited in the setting of virtual visit *** Constitutional:? No fever, chills or unintentional weight loss  Cardiovascular:? No chest pain or pressure, shortness of breath, dyspnea on exertion, orthopnea or LE edema  Pulmonary:? No cough or shortness of breath  GI:? No nausea, vomiting, constipation, diarrhea, abdominal pain, dyspepsia, change in bowel habits  Endocrine:? No polyuria, polyphagia or blurred vision    Physical Examination:  Vitals:  Wt Readings from Last 3 Encounters:  03/26/24 296 lb (134.3 kg)  03/23/24 (P) 271 lb 9.7 oz (123.2 kg)  12/11/23 (!) 324 lb 3.2 oz (147.1 kg)   BP Readings from Last 3 Encounters:  04/09/24 136/80  03/26/24 (!) 109/54  03/23/24 (!) 120/56   Pulse Readings from Last 3 Encounters:  04/09/24 76  03/26/24 91  03/23/24 75     Labs:?  Lab Results  Component Value Date   HGBA1C 7.9 (H) 03/22/2024   HGBA1C 7.0 (H) 11/18/2023   HGBA1C 6.4 05/20/2023   GLUCOSE 148 (H) 04/09/2024   MICRALBCREAT 14.1 11/18/2023   CREATININE 2.07 (H) 04/09/2024   CREATININE 1.31 (H) 03/22/2024   CREATININE 1.19 03/21/2024   GFR 31.01 (L) 04/09/2024   GFR 51.03 (L) 11/18/2023   GFR 68.12 05/20/2023    Lab Results  Component Value Date   CHOL 126 11/18/2023   LDLCALC 67 11/18/2023   LDLCALC 75 05/20/2023   LDLCALC 77 05/15/2022   HDL 38.90 (L) 11/18/2023   TRIG 103.0 11/18/2023   TRIG 110.0 05/20/2023   TRIG 87.0 05/15/2022   ALT 30 03/11/2024   ALT 29 11/18/2023   AST 37 03/11/2024   AST 18 11/18/2023      Chemistry      Component Value Date/Time   NA 142 04/09/2024 1107   K 4.1 04/09/2024 1107   CL 97 04/09/2024 1107   CO2 31 04/09/2024 1107   BUN 33 (H) 04/09/2024 1107   CREATININE 2.07 (H) 04/09/2024 1107   CREATININE 1.16 01/13/2015 0936      Component Value Date/Time   CALCIUM  9.0 04/09/2024 1107   ALKPHOS 89 03/11/2024 0818   AST 37 03/11/2024 0818   ALT 30 03/11/2024 0818   BILITOT 1.1 03/11/2024 0818      The ASCVD Risk score (Arnett DK, et al., 2019) failed to calculate for the following reasons:   The valid total cholesterol range is 130 to 320 mg/dL  Assessment and Plan:   1. Diabetes, type 2: uncontrolled per last A1c of 7.9% (03/22/24), increased from previous, 7.0%. Goal <7% without hypoglycemia.  *** {lzcgmsmbg:31042} data shows ***?. Current Regimen: *** Diet:  *** Exercise: ***  HCM: {LZ HCM DM2:31604} Continue medications today without changes.  Consider Farxiga 5 mg daily through [facility] in place of Jardiance  Consider GLP1 Reviewed s/sx/tx hypoglycemia Future Consideration: GLP1-RA: Ozempic thorough Novo Nordisk  SGLT2i: Jardiance  = Nausea Metformin: Diarrhea   TZD: Avoid given CHF Insulin : Not unreasonable, though defer as last resort or as needed for severe hyperglycemia given risk hypoglycemia. Other safer options at this time.   *** Januvia   2. HTN: {CHL Controlled/Uncontrolled:[phone]} based on last clinic BP of *** mmHg (***), goal <130/80 mmHg. Does not *** monitor BP at home. Denies lightheadedness, dizziness, SOB, CP, vision changes.  Current Regimen: *** Continue medications without changes.  BP Readings from Last 3 Encounters:  04/09/24 136/80  03/26/24 (!) 109/54  03/23/24 (!) 120/56     3. ASCVD (*** prevention): {CHL Controlled/Uncontrolled:[phone]} on last lipid panel with LDL *** mg/dL, TG *** mg/dL (***). LDL goal {ldl goal:31310}.  Key risk factors include: {cvs risk:31001} The ASCVD Risk score (Arnett DK, et al., 2019) failed to calculate for the following reasons:   The valid total cholesterol range is 130 to 320 mg/dL indicated patient is at *** risk.  PREVENT: 10-Yr CAD ***%, 30-Yr CAD ***%  indicated patient is at *** risk.  (those without ASCVD, CHF), Age 85-79 (includes additional metabolic risk factors, BMI, GFR, UACR, no race) Current Regimen: *** mg daily Continue medications today without changes.  Lab Results  Component Value Date   LDLCALC 67 11/18/2023   LDLCALC 75 05/20/2023   TRIG 103.0 11/18/2023   TRIG 110.0 05/20/2023     4. Healthcare Maintenance:  Pneumococcal - Current status: ***  Shingles - Current status: *** Influenza - Current status: ***  Due to receive the following vaccines: {LZAPIMMUNIZATION:31028}   Follow Up Follow up with clinical pharmacist via *** in ***  weeks Patient given direct line for questions regarding medication therapy  Future Appointments  Date Time Provider Department Center  04/23/2024 10:00 AM [facility] PHARMACIST [facility]  05/10/2024 11:40 AM Corwin Antu, FNP [facility]  05/11/2024  2:00 PM Donette Ellouise LABOR, FNP [facility] None  05/19/2024  8:50 AM [facility] ANNUAL WELLNESS VISIT 1 [facility]  06/01/2024  2:00 PM Malachy Comer GAILS, NP [facility] None  06/15/2024  2:15 PM Darlean, Ozell NOVAK, MD [facility] None    Manuelita FABIENE Kobs, PharmD Clinical Pharmacist [facility] [phone]

## 2024-04-27 ENCOUNTER — Inpatient Hospital Stay: Admitting: Family

## 2024-05-10 ENCOUNTER — Encounter: Payer: Self-pay | Admitting: Family

## 2024-05-10 ENCOUNTER — Telehealth: Payer: Self-pay | Admitting: Family

## 2024-05-10 ENCOUNTER — Ambulatory Visit (INDEPENDENT_AMBULATORY_CARE_PROVIDER_SITE_OTHER): Admitting: Family

## 2024-05-10 VITALS — BP 106/70 | HR 100 | Temp 97.9°F | Ht 69.0 in | Wt 290.0 lb

## 2024-05-10 DIAGNOSIS — Z794 Long term (current) use of insulin: Secondary | ICD-10-CM

## 2024-05-10 DIAGNOSIS — E11649 Type 2 diabetes mellitus with hypoglycemia without coma: Secondary | ICD-10-CM

## 2024-05-10 DIAGNOSIS — E1159 Type 2 diabetes mellitus with other circulatory complications: Secondary | ICD-10-CM

## 2024-05-10 DIAGNOSIS — I152 Hypertension secondary to endocrine disorders: Secondary | ICD-10-CM

## 2024-05-10 DIAGNOSIS — R000 Tachycardia, unspecified: Secondary | ICD-10-CM

## 2024-05-10 DIAGNOSIS — D649 Anemia, unspecified: Secondary | ICD-10-CM

## 2024-05-10 DIAGNOSIS — R0609 Other forms of dyspnea: Secondary | ICD-10-CM | POA: Diagnosis not present

## 2024-05-10 DIAGNOSIS — Z79899 Other long term (current) drug therapy: Secondary | ICD-10-CM

## 2024-05-10 LAB — BASIC METABOLIC PANEL WITH GFR
BUN: 20 mg/dL (ref 6–23)
CO2: 28 meq/L (ref 19–32)
Calcium: 9 mg/dL (ref 8.4–10.5)
Chloride: 100 meq/L (ref 96–112)
Creatinine, Ser: 1.74 mg/dL — ABNORMAL HIGH (ref 0.40–1.50)
GFR: 38.17 mL/min — ABNORMAL LOW (ref >60.00)
Glucose, Bld: 199 mg/dL — ABNORMAL HIGH (ref 70–99)
Potassium: 4 meq/L (ref 3.5–5.1)
Sodium: 142 meq/L (ref 135–145)

## 2024-05-10 LAB — CBC WITH DIFFERENTIAL/PLATELET
Basophils Absolute: 0 K/uL (ref 0.0–0.1)
Basophils Relative: 0.4 % (ref 0.0–3.0)
Eosinophils Absolute: 0.1 K/uL (ref 0.0–0.7)
Eosinophils Relative: 0.9 % (ref 0.0–5.0)
HCT: 38.1 % — ABNORMAL LOW (ref 39.0–52.0)
Hemoglobin: 12.4 g/dL — ABNORMAL LOW (ref 13.0–17.0)
Lymphocytes Relative: 21.6 % (ref 12.0–46.0)
Lymphs Abs: 2.4 K/uL (ref 0.7–4.0)
MCHC: 32.5 g/dL (ref 30.0–36.0)
MCV: 90.1 fl (ref 78.0–100.0)
Monocytes Absolute: 0.8 K/uL (ref 0.1–1.0)
Monocytes Relative: 7.6 % (ref 3.0–12.0)
Neutro Abs: 7.6 K/uL (ref 1.4–7.7)
Neutrophils Relative %: 69.5 % (ref 43.0–77.0)
Platelets: 300 K/uL (ref 150.0–400.0)
RBC: 4.22 Mil/uL (ref 4.22–5.81)
RDW: 16.9 % — ABNORMAL HIGH (ref 11.5–15.5)
WBC: 10.9 K/uL — ABNORMAL HIGH (ref 4.0–10.5)

## 2024-05-10 LAB — IBC + FERRITIN
Ferritin: 113.3 ng/mL (ref 22.0–322.0)
Iron: 59 ug/dL (ref 42–165)
Saturation Ratios: 21.8 % (ref 20.0–50.0)
TIBC: 270.2 ug/dL (ref 250.0–450.0)
Transferrin: 193 mg/dL — ABNORMAL LOW (ref 212.0–360.0)

## 2024-05-10 LAB — BRAIN NATRIURETIC PEPTIDE: Pro B Natriuretic peptide (BNP): 89 pg/mL (ref 0.0–100.0)

## 2024-05-10 LAB — TSH: TSH: 2.92 u[IU]/mL (ref 0.35–5.50)

## 2024-05-10 MED ORDER — OMEPRAZOLE 20 MG PO CPDR: 20.0000 mg | DELAYED_RELEASE_CAPSULE | Freq: Every day | ORAL | 3 refills | Status: DC

## 2024-05-10 MED ORDER — IRBESARTAN 150 MG PO TABS: 150.0000 mg | ORAL_TABLET | Freq: Every day | ORAL | 0 refills | Status: DC

## 2024-05-10 MED ORDER — TIRZEPATIDE 2.5 MG/0.5ML ~~LOC~~ SOAJ: 2.5000 mg | SUBCUTANEOUS | 0 refills | Status: DC

## 2024-05-10 NOTE — Progress Notes (Deleted)
 Advanced Heart Failure Clinic Note   Referring Physician: admission PCP: Corwin Antu, FNP Cardiologist: Peter [place], MD   Chief Complaint: shortness of breath   HPI:  Alfred Carney is a 73 y/o male with a history of non-insulin -dependent diabetes mellitus, PAD, hyperlipidemia, hypertension, history of gout, BPH, OSA (non compliant w/ CPAP), COPD on chronic 2 L nasal cannula at baseline, lumbar spine DJD with stenosis, venous stasis, CKD, AF/ RVR (06/25), paroxsymal atrial tachycardia and HFpEF.   Pertinent cardiac history: Normal Lexiscan  Myoview  in 09/2011. Echo in 10/2018 noted LVEF of 60-65%. Echo in 05/2020 with LVEF unchanged and grade I diastolic dysfunction.  Admitted 03/11/24 with weakness over ~2 weeks and recent fall. On admission, BNP was 116.1, HS-troponin was 16, lactic acid 1.2, uric acid 5.5, and TSH was 1.339. Chest x-ray noted emphysema. Initial EKG showed atrial fibrillation however rhythm strip evaluated by cardiology showed PACs with sinus tachycardia.  [facility] cardiology input noted, favored secondary to untreated sleep apnea/obesity hypoventilation syndrome. Echo 03/12/24: LVEF of 55-60%, and grade I diastolic dysfunction. Started on cardizem  and given IV lasix . Abnormal UA and with weakness so treated with three doses of Rocephin . Urine culture negative.   He presents today, with his daughter, from [facility] with a chief complaint of shortness of breath Has associated fatigue, leg weakness, pedal edema (improving). He says that he's been feeling pretty good up until today where he's been experiencing more shortness of breath. He's been receiving daily PT. Not getting weighed daily at facility.  He says that he fell earlier today when he bent over at the waist, he continued on over. Denies dizziness or syncope leading up to this event. He says that his legs get weak and he just goes on over.    Review of Systems: [y] = yes, [ ]  = no   General: Weight gain [  ]; Weight loss [ ] ; Anorexia [ ] ; Fatigue Davis.Dad ]; Fever [ ] ; Chills [ ] ; Weakness Davis.Dad ]  Cardiac: Chest pain/pressure [ ] ; Resting SOB [ ] ; Exertional SOB Davis.Dad ]; Orthopnea [ ] ; Pedal Edema Davis.Dad ]; Palpitations [ ] ; Syncope [ ] ; Presyncope [ ] ; Paroxysmal nocturnal dyspnea[ ]   Pulmonary: Cough [ ] ; Wheezing[ ] ; Hemoptysis[ ] ; Sputum [ ] ; Snoring [ ]   GI: Vomiting[ ] ; Dysphagia[ ] ; Melena[ ] ; Hematochezia [ ] ; Heartburn[ ] ; Abdominal pain [ ] ; Constipation [ ] ; Diarrhea [ ] ; BRBPR [ ]   GU: Hematuria[ ] ; Dysuria [ ] ; Nocturia[ ]   Vascular: Pain in legs with walking [ ] ; Pain in feet with lying flat [ ] ; Non-healing sores [ ] ; Stroke [ ] ; TIA [ ] ; Slurred speech [ ] ;  Neuro: Headaches[ ] ; Vertigo[ ] ; Seizures[ ] ; Paresthesias[ ] ;Blurred vision [ ] ; Diplopia [ ] ; Vision changes [ ]   Ortho/Skin: Arthritis Davis.Dad ]; Joint pain [ ] ; Muscle pain [ ] ; Joint swelling [ ] ; Back Pain [ ] ; Rash [ ]   Psych: Depression[ ] ; Anxiety[ ]   Heme: Bleeding problems [ ] ; Clotting disorders [ ] ; Anemia [ ]   Endocrine: Diabetes Davis.Dad ]; Thyroid  dysfunction[ ]    Past Medical History:  Diagnosis Date   Chronic obstructive pulmonary disease (HCC) 2008   Moderate   Colon polyps    Community acquired pneumonia    Coronary artery disease    Degenerative joint disease    Diabetes mellitus without complication (HCC)    type 2   Emphysema lung (HCC)    Enlarged liver    fatty liver by '09 CT  Gastroesophageal reflux disease    Gout    H/O hiatal hernia    History of [place] spotted fever    Possible history of [place] Spotted Fever   Hyperlipidemia    Hypertension    Hypokalemia    diuretic induced, resolved   Microcytic anemia    iron pills   Morbid obesity (HCC)    Shortness of breath    Skin cancer    skin cancer lip removed   Sleep apnea    not currently using CPAP 05/20/13   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 8 g 2   allopurinol  (ZYLOPRIM ) 100 MG tablet Take 2 tablets (200 mg total) by mouth daily. 180 tablet 3   aspirin  EC 81 MG tablet Take 1 tablet (81 mg total) by mouth daily. Swallow whole.     Calcium  Carbonate-Simethicone  750-80 MG CHEW Chew 1 tablet by mouth as needed (gas).      Cholecalciferol  (VITAMIN D  PO) Take 1,000 Units by mouth daily.     furosemide  (LASIX ) 20 MG tablet Take 2 tablets by mouth once daily 60 tablet 2   glipiZIDE  (GLUCOTROL ) 5 MG tablet TAKE 1 TABLET BY MOUTH TWICE DAILY BEFORE A MEAL 180 tablet 3   glucose blood test strip Use as instructed 100 each 12   irbesartan  (AVAPRO ) 150 MG tablet Take 1 tablet (150 mg total) by mouth daily. 90 tablet 0   Lancet Device MISC 1 Lancet by Does not apply route daily as needed. 100 each 3   lidocaine  (LIDODERM ) 5 % Place 3 patches onto the skin daily. To right knee and both ankles     meloxicam  (MOBIC ) 7.5 MG tablet Take 1 tablet by mouth once daily 90 tablet 3   metoprolol  tartrate (LOPRESSOR ) 25 MG tablet Take 25 mg by mouth daily in the afternoon.     omeprazole  (PRILOSEC) 20 MG capsule Take 1 capsule (20 mg total) by mouth daily. 30 capsule 3   OXYGEN  Inhale 2 L into the lungs continuous.      pravastatin  (PRAVACHOL ) 40 MG tablet Take 1 tablet (40 mg total) by mouth daily. 90 tablet 3   tacrolimus (PROTOPIC) 0.1 % ointment Apply topically 2 (two) times daily. 2 weeks     tamsulosin  (FLOMAX ) 0.4 MG CAPS capsule Take 1 capsule by mouth at bedtime 90 capsule 1   tirzepatide  (MOUNJARO ) 2.5 MG/0.5ML Pen Inject 2.5 mg into the skin once a week. 2 mL 0   No current facility-administered medications for this visit.    Allergies  Allergen Reactions   Jardiance  [Empagliflozin ] Other (See Comments)    Stomach upset      Social History   Socioeconomic History    Marital status: Married    Spouse name: Alfred Carney   Number of children: 4   Years of education: GED, ITT   Highest education level: Associate degree: occupational, Scientist, product/process development, or vocational program  Occupational History   Occupation: architectural drawing-retired  Tobacco Use   Smoking status: Former    Current packs/day: 0.00    Average packs/day: 1 pack/day for 35.0 years (35.0 ttl pk-yrs)    Types: Cigarettes    Start date: 09/17/1971    Quit date: 09/16/2006    Years  since quitting: 17.6   Smokeless tobacco: Never  Vaping Use   Vaping status: Never Used  Substance and Sexual Activity   Alcohol use: No    Alcohol/week: 0.0 standard drinks of alcohol   Drug use: No   Sexual activity: Not Currently  Other Topics Concern   Not on file  Social History Narrative   Lives with wife   Right Handed   Drinks 4-5 cups caffeine daily   Social Drivers of Health   Financial Resource Strain: Low Risk  (03/16/2024)   Overall Financial Resource Strain (CARDIA)    Difficulty of Paying Living Expenses: Not hard at all  Food Insecurity: No Food Insecurity (03/16/2024)   Hunger Vital Sign    Worried About Running Out of Food in the Last Year: Never true    Ran Out of Food in the Last Year: Never true  Transportation Needs: No Transportation Needs (04/06/2024)   PRAPARE - Administrator, Civil Service (Medical): No    Lack of Transportation (Non-Medical): No  Physical Activity: Insufficiently Active (05/27/2023)   Exercise Vital Sign    Days of Exercise per Week: 1 day    Minutes of Exercise per Session: 10 min  Stress: No Stress Concern Present (05/27/2023)   [employer] of Occupational Health - Occupational Stress Questionnaire    Feeling of Stress : Not at all  Social Connections: Moderately Integrated (03/12/2024)   Social Connection and Isolation Panel    Frequency of Communication with Friends and Family: More than three times a week    Frequency of Social Gatherings with  Friends and Family: Twice a week    Attends Religious Services: Never    Database administrator or Organizations: Yes    Attends Banker Meetings: Never    Marital Status: Married  Catering manager Violence: Not At Risk (03/12/2024)   Humiliation, Afraid, Rape, and Kick questionnaire    Fear of Current or Ex-Partner: No    Emotionally Abused: No    Physically Abused: No    Sexually Abused: No      Family History  Problem Relation Age of Onset   Osteoarthritis Mother    Diabetes Mother    Pulmonary embolism Mother    Heart disease Father        Coronary Artery Disease   Stroke Father    Multiple sclerosis Sister    Factor V Leiden deficiency Sister    Emphysema Sister    Hyperlipidemia Brother    Heart attack Maternal Grandmother    Lung cancer Maternal Grandfather        smoked    There were no vitals filed for this visit.  Wt Readings from Last 3 Encounters:  05/10/24 290 lb (131.5 kg)  03/26/24 296 lb (134.3 kg)  03/23/24 (P) 271 lb 9.7 oz (123.2 kg)   Lab Results  Component Value Date   CREATININE 2.07 (H) 04/09/2024   CREATININE 1.31 (H) 03/22/2024   CREATININE 1.19 03/21/2024    PHYSICAL EXAM:  General: Well appearing obese male in wheelchair. No resp difficulty HEENT: normal Neck: supple, no JVD Cor: Regular rhythm, rate. No rubs, gallops or murmurs Lungs: clear Abdomen: soft, nontender, nondistended. Extremities: no cyanosis, clubbing, rash, trace pitting edema bilaterally to shins Neuro: alert & oriented X 3. Moves all 4 extremities w/o difficulty. Affect pleasant   ECG: NSR with HR 90 (personally reviewed)   ASSESSMENT & PLAN:  1: NICM with preserved ejection fraction- - suspect due  to untreated OSA/ PAT - NYHA class III - euvolemic - order written for daily weights at facility and to call for overnight weight gain of > 2 pounds - Echo 03/12/24: LVEF of 55-60%, and grade I diastolic dysfunction. - continue furosemide  40mg   daily - continue irbesartan  300mg  daily - begin jardiance  10mg  daily - BP may not be able to tolerate MRA without reduction in irbesartan  - SNF facility called and CMP was drawn earlier today but no results at this time; they will fax results to us  once they receive them - order for BMET to be drawn 04/12/24 and results faxed to us  - BNP 03/11/24 was 116.1  2: HTN- - BP 109/54 - seeing PCP at SNF - BMET 03/22/24 reviewed: sodium 134, potassium 5.1, creatinine 1.31 & GFR 57  3: DM- - A1c 03/22/24 was 7.9% - continue glipizide , insulin  - if glucose starts to get too low with the addition of jardiance , would prefer to stop glipizide   4: Hyperlipidemia-  - LDL 11/18/23 was 67 - continue pravastatin  40mg  daily  5: AF/ PAT- - EKG today is NSR - continue diltiazem  120mg  daily - continue metoprolol  succinate 25mg  daily  6: OSA- - noncompliant with CPAP as he says that he sleeps better without wearing it  7: COPD- - sees pulmonology [place]) 08/25 - wearing oxygen  at 2L around the clock. When he arrived, he was visibly SOB and asking about turning his oxygen  up. Pulse ox was 95%. When tubing was checked, it was kinked completely off where it attaches to the tank. New tubing applied and patient says that he can feel the oxygen  blowing now. By the end of the visit, he was more comfortable and said that he was breathing better.    Return in 3-4 weeks, sooner if needed.   Ellouise DELENA Class, FNP 05/10/24

## 2024-05-10 NOTE — Progress Notes (Unsigned)
 Established Patient Office Visit  Subjective:      CC:  Chief Complaint  Patient presents with  . Medical Management of Chronic Issues    HPI: Alfred Carney is a 73 y.o. male presenting on 05/10/2024 for Medical Management of Chronic Issues .  Discussed the use of AI scribe software for clinical note transcription with the patient, who gave verbal consent to proceed.  History of Present Illness          Social history:  Relevant past medical, surgical, family and social history reviewed and updated as indicated. Interim medical history since our last visit reviewed.  Allergies and medications reviewed and updated.  DATA REVIEWED: CHART IN EPIC     ROS: Negative unless specifically indicated above in HPI.    Current Outpatient Medications:  .  albuterol  (VENTOLIN  HFA) 108 (90 Base) MCG/ACT inhaler, Inhale 2 puffs into the lungs every 4 (four) hours as needed for wheezing or shortness of breath., Disp: 8 g, Rfl: 2 .  allopurinol  (ZYLOPRIM ) 100 MG tablet, Take 2 tablets (200 mg total) by mouth daily., Disp: 180 tablet, Rfl: 3 .  aspirin  EC 81 MG tablet, Take 1 tablet (81 mg total) by mouth daily. Swallow whole., Disp: , Rfl:  .  Calcium  Carbonate-Simethicone  750-80 MG CHEW, Chew 1 tablet by mouth as needed (gas). , Disp: , Rfl:  .  Cholecalciferol  (VITAMIN D  PO), Take 1,000 Units by mouth daily., Disp: , Rfl:  .  furosemide  (LASIX ) 20 MG tablet, Take 2 tablets by mouth once daily, Disp: 60 tablet, Rfl: 2 .  glipiZIDE  (GLUCOTROL ) 5 MG tablet, TAKE 1 TABLET BY MOUTH TWICE DAILY BEFORE A MEAL, Disp: 180 tablet, Rfl: 3 .  glucose blood test strip, Use as instructed, Disp: 100 each, Rfl: 12 .  irbesartan  (AVAPRO ) 150 MG tablet, Take 1 tablet (150 mg total) by mouth daily., Disp: 90 tablet, Rfl: 0 .  Lancet Device MISC, 1 Lancet by Does not apply route daily as needed., Disp: 100 each, Rfl: 3 .  lidocaine  (LIDODERM ) 5 %, Place 3 patches onto the skin daily. To right knee  and both ankles, Disp: , Rfl:  .  meloxicam  (MOBIC ) 7.5 MG tablet, Take 1 tablet by mouth once daily, Disp: 90 tablet, Rfl: 3 .  metoprolol  tartrate (LOPRESSOR ) 25 MG tablet, Take 25 mg by mouth daily in the afternoon., Disp: , Rfl:  .  omeprazole  (PRILOSEC) 20 MG capsule, Take 1 capsule (20 mg total) by mouth daily., Disp: 30 capsule, Rfl: 3 .  OXYGEN , Inhale 2 L into the lungs continuous. , Disp: , Rfl:  .  pravastatin  (PRAVACHOL ) 40 MG tablet, Take 1 tablet (40 mg total) by mouth daily., Disp: 90 tablet, Rfl: 3 .  tacrolimus (PROTOPIC) 0.1 % ointment, Apply topically 2 (two) times daily. 2 weeks, Disp: , Rfl:  .  tamsulosin  (FLOMAX ) 0.4 MG CAPS capsule, Take 1 capsule by mouth at bedtime, Disp: 90 capsule, Rfl: 1 .  tirzepatide  (MOUNJARO ) 2.5 MG/0.5ML Pen, Inject 2.5 mg into the skin once a week., Disp: 2 mL, Rfl: 0        Objective:        BP 106/70 (BP Location: Left Arm, Patient Position: Sitting, Cuff Size: Large)   Pulse 100   Temp 97.9 F (36.6 C) (Temporal)   Ht 5' 9 (1.753 m)   Wt 290 lb (131.5 kg)   SpO2 96% Comment: 2L  BMI 42.83 kg/m   Physical Exam   Wt Readings from  Last 3 Encounters:  05/10/24 290 lb (131.5 kg)  03/26/24 296 lb (134.3 kg)  03/23/24 (P) 271 lb 9.7 oz (123.2 kg)    Physical Exam Vitals reviewed.  Constitutional:      General: He is not in acute distress.    Appearance: Normal appearance. He is normal weight. He is not ill-appearing, toxic-appearing or diaphoretic.  Cardiovascular:     Rate and Rhythm: Tachycardia present.     Comments: Venous dermatititis bil lower ext  Pulmonary:     Effort: Pulmonary effort is normal.  Musculoskeletal:        General: Normal range of motion.     Right lower leg: 2+ Pitting Edema present.     Left lower leg: 2+ Pitting Edema present.  Neurological:     General: No focal deficit present.     Mental Status: He is alert and oriented to person, place, and time. Mental status is at baseline.   Psychiatric:        Mood and Affect: Mood normal.        Behavior: Behavior normal.        Thought Content: Thought content normal.        Judgment: Judgment normal.          Results   Assessment & Plan:   Assessment and Plan Assessment & Plan       No follow-ups on file.     Ginger Patrick, MSN, APRN, FNP-C [place] [facility]

## 2024-05-10 NOTE — Telephone Encounter (Signed)
 Called to confirm/remind patient of their appointment [facility] on 05/11/24.   Appointment:   [x] Confirmed  [] Left mess   [] No answer/No voice mail  [] VM Full/unable to leave message  [] Phone not in service  Patient reminded to bring all medications and/or complete list.  Confirmed patient has transportation. Gave directions, instructed to utilize valet parking.

## 2024-05-10 NOTE — Patient Instructions (Addendum)
  Decrease irbesartan  to 150 mg once daily  Increase metoprolol  to 25 mg twice daily  Follow up with cardiology tomorrow.   Trial start mounjaro  pending insurance coverage

## 2024-05-11 ENCOUNTER — Other Ambulatory Visit: Payer: Self-pay | Admitting: Internal Medicine

## 2024-05-11 ENCOUNTER — Emergency Department

## 2024-05-11 ENCOUNTER — Emergency Department
Admission: EM | Admit: 2024-05-11 | Discharge: 2024-05-11 | Disposition: A | Discharge status: Home / Self Care | Attending: Emergency Medicine | Admitting: Emergency Medicine

## 2024-05-11 ENCOUNTER — Telehealth: Payer: Self-pay

## 2024-05-11 ENCOUNTER — Other Ambulatory Visit: Payer: Self-pay

## 2024-05-11 ENCOUNTER — Encounter: Admitting: Family

## 2024-05-11 DIAGNOSIS — R0602 Shortness of breath: Secondary | ICD-10-CM | POA: Insufficient documentation

## 2024-05-11 DIAGNOSIS — R0600 Dyspnea, unspecified: Secondary | ICD-10-CM

## 2024-05-11 DIAGNOSIS — I509 Heart failure, unspecified: Secondary | ICD-10-CM | POA: Insufficient documentation

## 2024-05-11 DIAGNOSIS — J449 Chronic obstructive pulmonary disease, unspecified: Secondary | ICD-10-CM | POA: Diagnosis not present

## 2024-05-11 DIAGNOSIS — R600 Localized edema: Secondary | ICD-10-CM

## 2024-05-11 DIAGNOSIS — J189 Pneumonia, unspecified organism: Secondary | ICD-10-CM

## 2024-05-11 DIAGNOSIS — I110 Hypertensive heart disease with heart failure: Secondary | ICD-10-CM | POA: Insufficient documentation

## 2024-05-11 DIAGNOSIS — J9611 Chronic respiratory failure with hypoxia: Secondary | ICD-10-CM

## 2024-05-11 LAB — BASIC METABOLIC PANEL WITH GFR
Anion gap: 14 (ref 5–15)
BUN: 24 mg/dL — ABNORMAL HIGH (ref 8–23)
CO2: 23 mmol/L (ref 22–32)
Calcium: 9.2 mg/dL (ref 8.9–10.3)
Chloride: 104 mmol/L (ref 98–111)
Creatinine, Ser: 1.8 mg/dL — ABNORMAL HIGH (ref 0.61–1.24)
GFR, Estimated: 39 mL/min — ABNORMAL LOW (ref >60)
Glucose, Bld: 175 mg/dL — ABNORMAL HIGH (ref 70–99)
Potassium: 3.6 mmol/L (ref 3.5–5.1)
Sodium: 141 mmol/L (ref 135–145)

## 2024-05-11 LAB — CBC
HCT: 38.9 % — ABNORMAL LOW (ref 39.0–52.0)
Hemoglobin: 12.7 g/dL — ABNORMAL LOW (ref 13.0–17.0)
MCH: 29.9 pg (ref 26.0–34.0)
MCHC: 32.6 g/dL (ref 30.0–36.0)
MCV: 91.5 fL (ref 80.0–100.0)
Platelets: 283 K/uL (ref 150–400)
RBC: 4.25 MIL/uL (ref 4.22–5.81)
RDW: 15.5 % (ref 11.5–15.5)
WBC: 10.5 K/uL (ref 4.0–10.5)
nRBC: 0 % (ref 0.0–0.2)

## 2024-05-11 LAB — TROPONIN I (HIGH SENSITIVITY): Troponin I (High Sensitivity): 8 ng/L (ref <18)

## 2024-05-11 LAB — D-DIMER, QUANTITATIVE: D-Dimer, Quant: 3.44 ug{FEU}/mL — ABNORMAL HIGH (ref <0.50)

## 2024-05-11 LAB — BRAIN NATRIURETIC PEPTIDE: B Natriuretic Peptide: 126.1 pg/mL — ABNORMAL HIGH (ref 0.0–100.0)

## 2024-05-11 MED ORDER — DOXYCYCLINE HYCLATE 100 MG PO CAPS: 100.0000 mg | ORAL_CAPSULE | Freq: Two times a day (BID) | ORAL | 0 refills | Status: AC

## 2024-05-11 MED ORDER — IOHEXOL 350 MG/ML SOLN
75.0000 mL | Freq: Once | INTRAVENOUS | Status: AC | PRN
  Administered 2024-05-11: 75 mL via INTRAVENOUS

## 2024-05-11 NOTE — Telephone Encounter (Signed)
 NOTED

## 2024-05-11 NOTE — ED Triage Notes (Signed)
 Pt comes in via pov with complaints of SOB. After being seen at his primary care yesterday, pt was sent to the ER today to check for possible blood clots. Pt has a history of hypertension, COPD, and is chronically on 2L of oxygen . Pt took all of his medications this morning. Pt is not on blood thinners. Pt is alert and oriented x4 with no complaints of pain at this time.

## 2024-05-11 NOTE — Telephone Encounter (Signed)
 Alfred Carney

## 2024-05-11 NOTE — ED Provider Notes (Signed)
 Procedures  Clinical Course as of 05/11/24 1638  Tue May 11, 2024  1344 Independent review of labs, troponins not elevated, BNP is mildly elevated, electrolytes not severely deranged, his creatinine is mildly elevated, no leukocytosis [TT]  1513 DG Chest Port 1 View IMPRESSION: Minor basilar atelectasis versus scarring. No interval change or acute process by plain radiography.   [TT]    Clinical Course User Index [TT] Waymond Lorelle Cummins, MD    ----------------------------------------- 4:38 PM on 05/11/2024 ----------------------------------------- Ultrasound negative for DVT.  CT negative for PE, does show a small area of suspected pneumonia.  Patient reports symptoms are resolved and is at baseline, 100% on his usual 2 L nasal cannula oxygen .  Wishes to be discharged home and feels comfortable taking oral antibiotics.  He is stable for discharge  Final diagnoses:  Dyspnea, unspecified type  Chronic respiratory failure with hypoxia (HCC)  Community acquired pneumonia of left lower lobe of lung        Viviann Pastor, MD 05/11/24 [phone]

## 2024-05-11 NOTE — Telephone Encounter (Signed)
 I was able to speak with Mrs Mesta [facility] signed) and pt has not gone to ED due to having appt with Dr Donette this afternoon. Pt was advised per Ginger Patrick NP instruction that pt needs to go to ED now to eval elevated d dimer for concern of blood clot. Mrs Larsh voiced understanding and is going to take pt to [facility] ED now. Sending note to ONEIDA Patrick FNP and [place] pool.

## 2024-05-11 NOTE — ED Provider Notes (Signed)
 [name] Provider Note    Event Date/Time   First MD Initiated Contact with Patient 05/11/24 1129     (approximate)   History   Shortness of Breath   HPI  Alfred Carney is a 73 y.o. male with history of COPD on 3 L nasal cannula at baseline, PAD, hypertension, CHF, presenting for shortness of breath.  Patient denies any symptoms at this time.  Has been having intermittent shortness of breath, went to see his primary care doctor who ordered D-dimer that is not elevated.  Was instructed to come in to get evaluated for blood clots.  He denies any recent travel or surgeries, states that his legs are swollen but not more so.  Also has erythema to his bilateral lower extremities that he says is chronic and unchanged.  Denies any cough or chest pain.  No fever.  On independent chart review, he had a D-dimer done yesterday that was 3.44.    Physical Exam   Triage Vital Signs: ED Triage Vitals  Encounter Vitals Group     BP 05/11/24 1118 (!) 150/106     Girls Systolic BP Percentile --      Girls Diastolic BP Percentile --      Boys Systolic BP Percentile --      Boys Diastolic BP Percentile --      Pulse Rate 05/11/24 1118 100     Resp 05/11/24 1118 18     Temp 05/11/24 1118 97.8 F (36.6 C)     Temp src --      SpO2 05/11/24 1118 96 %     Weight 05/11/24 1119 289 lb 14.5 oz (131.5 kg)     Height 05/11/24 1119 5' 9 (1.753 m)     Head Circumference --      Peak Flow --      Pain Score 05/11/24 1118 0     Pain Loc --      Pain Education --      Exclude from Growth Chart --     Most recent vital signs: Vitals:   05/11/24 1118 05/11/24 1507  BP: (!) 150/106 (!) 134/47  Pulse: 100 65  Resp: 18 (!) 22  Temp: 97.8 F (36.6 C) 97.7 F (36.5 C)  SpO2: 96% 100%     General: Awake, no distress.  CV:  Good peripheral perfusion.  Resp:  Normal effort.  No tachypnea or respiratory distress, no wheezing or crackles. Abd:  No distention.  Soft  nontender Other:  Bilateral lower extremity edema that appears symmetrical, he has mild erythema to anterior legs bilaterally that seems more consistent with chronic venous stasis, it is not warm or tender   ED Results / Procedures / Treatments   Labs (all labs ordered are listed, but only abnormal results are displayed) Labs Reviewed  BASIC METABOLIC PANEL WITH GFR - Abnormal; Notable for the following components:      Result Value   Glucose, Bld 175 (*)    BUN 24 (*)    Creatinine, Ser 1.80 (*)    GFR, Estimated 39 (*)    All other components within normal limits  CBC - Abnormal; Notable for the following components:   Hemoglobin 12.7 (*)    HCT 38.9 (*)    All other components within normal limits  BRAIN NATRIURETIC PEPTIDE - Abnormal; Notable for the following components:   B Natriuretic Peptide 126.1 (*)    All other components within normal limits  TROPONIN  I (HIGH SENSITIVITY)     EKG  EKG shows, EKG shows sinus rhythm with sinus arrhythmia, rate 97, normal QS, normal QTc, no obvious ischemic ST elevation, not significantly changed compared to prior   RADIOLOGY On my independent interpretation, chest x-ray shows mild opacities in the bases, radiologist was reading it is atelectasis versus scarring.   PROCEDURES:  Critical Care performed: No  Procedures   MEDICATIONS ORDERED IN ED: Medications  iohexol  (OMNIPAQUE ) 350 MG/ML injection 75 mL (75 mLs Intravenous Contrast Given 05/11/24 1327)     IMPRESSION / MDM / ASSESSMENT AND PLAN / ED COURSE  I reviewed the triage vital signs and the nursing notes.                              Differential diagnosis includes, but is not limited to, ACS, PE, DVT, no new cough or fever to suggest viral illness or pneumonia, no wheezing on exam or tachypnea or respiratory distress to suggest COPD exacerbation.  Also consider CHF.  Will get labs, EKG, troponin, CT PE study, DVT ultrasounds.  Patient's presentation is most  consistent with acute presentation with potential threat to life or bodily function.  Independent interpretation of labs and imaging below. Pt signed out pending CT and US  results.  The patient is on the cardiac monitor to evaluate for evidence of arrhythmia and/or significant heart rate changes.   Clinical Course as of 05/11/24 1525  Tue May 11, 2024  1344 Independent review of labs, troponins not elevated, BNP is mildly elevated, electrolytes not severely deranged, his creatinine is mildly elevated, no leukocytosis [TT]  1513 DG Chest Port 1 View IMPRESSION: Minor basilar atelectasis versus scarring. No interval change or acute process by plain radiography.   [TT]    Clinical Course User Index [TT] Waymond, Lorelle Cummins, MD     FINAL CLINICAL IMPRESSION(S) / ED DIAGNOSES   Final diagnoses:  Dyspnea, unspecified type     Rx / DC Orders   ED Discharge Orders     None        Note:  This document was prepared using Dragon voice recognition software and may include unintentional dictation errors.    Waymond Lorelle Cummins, MD 05/11/24 [phone]

## 2024-05-11 NOTE — ED Notes (Signed)
 Unsuccessful IV attempt x2, IV team consult placed.

## 2024-05-11 NOTE — Progress Notes (Signed)
 On call provider advised at 707a. Documentation for basis of test not yet available. Lab Results  Component Value Date   DDIMER 3.44 (H) 05/10/2024  Called patient and advised go to emergency room to rule out blood clot.

## 2024-05-12 ENCOUNTER — Ambulatory Visit: Payer: Self-pay | Admitting: Family

## 2024-05-12 NOTE — Telephone Encounter (Signed)
 Copied from CRM #[id number]. Topic: Clinical - Lab/Test Results >> May 12, 2024 10:53 AM [place] J wrote: Reason for CRM: Patient's wife returning a call from the clinic. Patient was able to inform me that he is not seeing a kidney doctor currently. He also scheduled a hospital follow up for 09/10.

## 2024-05-18 ENCOUNTER — Other Ambulatory Visit: Payer: Self-pay | Admitting: Family

## 2024-05-18 DIAGNOSIS — N1831 Chronic kidney disease, stage 3a: Secondary | ICD-10-CM

## 2024-05-18 DIAGNOSIS — E1159 Type 2 diabetes mellitus with other circulatory complications: Secondary | ICD-10-CM

## 2024-05-19 ENCOUNTER — Ambulatory Visit: Payer: Medicare Other

## 2024-05-19 VITALS — Ht 69.0 in | Wt 289.0 lb

## 2024-05-19 DIAGNOSIS — Z0000 Encounter for general adult medical examination without abnormal findings: Secondary | ICD-10-CM | POA: Diagnosis not present

## 2024-05-19 NOTE — Progress Notes (Signed)
 Subjective:   Alfred Carney is a 73 y.o. who presents for a Medicare Wellness preventive visit.  As a reminder, Annual Wellness Visits don't include a physical exam, and some assessments may be limited, especially if this visit is performed virtually. We may recommend an in-person follow-up visit with your provider if needed.  Visit Complete: Virtual I connected with  Alfred Carney on 05/19/24 by a audio enabled telemedicine application and verified that I am speaking with the correct person using two identifiers.  Patient Location: Home  Provider Location: Home Office  I discussed the limitations of evaluation and management by telemedicine. The patient expressed understanding and agreed to proceed.  Vital Signs: Because this visit was a virtual/telehealth visit, some criteria may be missing or patient reported. Any vitals not documented were not able to be obtained and vitals that have been documented are patient reported.  VideoDeclined- This patient declined Librarian, academic. Therefore the visit was completed with audio only.  Persons Participating in Visit: Patient.  AWV Questionnaire: No: Patient Medicare AWV questionnaire was not completed prior to this visit.  Cardiac Risk Factors include: advanced age (>44men, >59 women);diabetes mellitus;dyslipidemia;hypertension;male gender;obesity (BMI >30kg/m2);sedentary lifestyle     Objective:    Today's Vitals   05/19/24 0926 05/19/24 0927  Weight: 289 lb (131.1 kg)   Height: 5' 9 (1.753 m)   PainSc:  2    Body mass index is 42.68 kg/m.     05/19/2024    9:37 AM 05/11/2024   11:21 AM 03/12/2024    5:31 PM 03/11/2024    6:27 PM 05/08/2022   10:40 AM 07/25/2020   12:05 PM 10/30/2018    7:20 AM  Advanced Directives  Does Patient Have a Medical Advance Directive? No No No No No No No   Would patient like information on creating a medical advance directive?   No - Patient declined No - Patient declined  No - Patient declined No - Patient declined No - Patient declined      Data saved with a previous flowsheet row definition    Current Medications (verified) Outpatient Encounter Medications as of 05/19/2024  Medication Sig   albuterol  (VENTOLIN  HFA) 108 (90 Base) MCG/ACT inhaler Inhale 2 puffs into the lungs every 4 (four) hours as needed for wheezing or shortness of breath.   allopurinol  (ZYLOPRIM ) 100 MG tablet Take 2 tablets (200 mg total) by mouth daily.   aspirin  EC 81 MG tablet Take 1 tablet (81 mg total) by mouth daily. Swallow whole.   Calcium  Carbonate-Simethicone  750-80 MG CHEW Chew 1 tablet by mouth as needed (gas).    Cholecalciferol  (VITAMIN D  PO) Take 1,000 Units by mouth daily.   furosemide  (LASIX ) 20 MG tablet Take 2 tablets by mouth once daily   glipiZIDE  (GLUCOTROL ) 5 MG tablet TAKE 1 TABLET BY MOUTH TWICE DAILY BEFORE A MEAL   glucose blood test strip Use as instructed   irbesartan  (AVAPRO ) 150 MG tablet Take 1 tablet (150 mg total) by mouth daily.   Lancet Device MISC 1 Lancet by Does not apply route daily as needed.   lidocaine  (LIDODERM ) 5 % Place 3 patches onto the skin daily. To right knee and both ankles   meloxicam  (MOBIC ) 7.5 MG tablet Take 1 tablet by mouth once daily   metoprolol  tartrate (LOPRESSOR ) 25 MG tablet Take 25 mg by mouth daily in the afternoon.   omeprazole  (PRILOSEC) 20 MG capsule Take 1 capsule (20 mg total) by mouth daily.  OXYGEN  Inhale 2 L into the lungs continuous.    pravastatin  (PRAVACHOL ) 40 MG tablet Take 1 tablet (40 mg total) by mouth daily.   tacrolimus (PROTOPIC) 0.1 % ointment Apply topically 2 (two) times daily. 2 weeks   tamsulosin  (FLOMAX ) 0.4 MG CAPS capsule Take 1 capsule by mouth at bedtime   tirzepatide  (MOUNJARO ) 2.5 MG/0.5ML Pen Inject 2.5 mg into the skin once a week.   No facility-administered encounter medications on file as of 05/19/2024.    Allergies (verified) Jardiance  [empagliflozin ]   History: Past Medical  History:  Diagnosis Date   Chronic obstructive pulmonary disease (HCC) 2008   Moderate   Colon polyps    Community acquired pneumonia    Coronary artery disease    Degenerative joint disease    Diabetes mellitus without complication (HCC)    type 2   Emphysema lung (HCC)    Enlarged liver    fatty liver by '09 CT   Gastroesophageal reflux disease    Gout    H/O hiatal hernia    History of [place] spotted fever    Possible history of [place] Spotted Fever   Hyperlipidemia    Hypertension    Hypokalemia    diuretic induced, resolved   Microcytic anemia    iron pills   Morbid obesity (HCC)    Shortness of breath    Skin cancer    skin cancer lip removed   Sleep apnea    not currently using CPAP 05/20/13   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 LEVEL  10/23/2018       ESOPHAGOGASTRODUODENOSCOPY (EGD) WITH PROPOFOL  N/A 01/22/2018   Procedure: ESOPHAGOGASTRODUODENOSCOPY (EGD) WITH PROPOFOL ;  Surgeon: Shila Gustav GAILS, MD;  Location: [facility];  Service: Endoscopy;  Laterality: N/A;   HARDWARE REMOVAL Left 05/26/2013   Procedure: HARDWARE REMOVAL;  Surgeon: Jerona GAILS Sage, MD;  Location: [facility] OR;  Service: Orthopedics;  Laterality: Left;  Left Total Hip Arthroplasty, Removal of Deep Hardware   HIP SURGERY     Status post left hip surgery with bone grafting   IR INJECT/THERA/INC NEEDLE/CATH/PLC EPI/LUMB/SAC W/IMG  10/23/2018   LEFT HEART CATHETERIZATION WITH CORONARY ANGIOGRAM N/A 01/31/2014   Procedure: LEFT HEART CATHETERIZATION WITH CORONARY ANGIOGRAM;  Surgeon:  Lonni JONETTA Cash, MD;  Location: [facility] CATH LAB;  Service: Cardiovascular;  Laterality: N/A;   LUMBAR LAMINECTOMY/DECOMPRESSION MICRODISCECTOMY Left 10/28/2018   Procedure: Left Lumbar three-four Extraforaminal microdiscectomy;  Surgeon: Alix Charleston, MD;  Location: [facility] OR;  Service: Neurosurgery;  Laterality: Left;   THORACOABDOMINAL AORTIC ANEURYSM REPAIR     with right femoral and left iliac BPG and reimplantation of renal arteries.   TONSILLECTOMY     TONSILLECTOMY     TOTAL HIP ARTHROPLASTY Left 05/26/2013   Procedure: TOTAL HIP ARTHROPLASTY;  Surgeon: Jerona GAILS Sage, MD;  Location: [facility] OR;  Service: Orthopedics;  Laterality: Left;  Left Total Hip Arthroplasty, Removal Deep Hardware   Family History  Problem Relation Age of Onset   Osteoarthritis Mother    Diabetes Mother    Pulmonary embolism Mother  Heart disease Father        Coronary Artery Disease   Stroke Father    Multiple sclerosis Sister    Factor V Leiden deficiency Sister    Emphysema Sister    Hyperlipidemia Brother    Heart attack Maternal Grandmother    Lung cancer Maternal Grandfather        smoked   Social History   Socioeconomic History   Marital status: Married    Spouse name: [name]   Number of children: 4   Years of education: GED, ITT   Highest education level: Associate degree: occupational, Scientist, product/process development, or vocational program  Occupational History   Occupation: architectural drawing-retired  Tobacco Use   Smoking status: Former    Current packs/day: 0.00    Average packs/day: 1 pack/day for 35.0 years (35.0 ttl pk-yrs)    Types: Cigarettes    Start date: 09/17/1971    Quit date: 09/16/2006    Years since quitting: 17.6   Smokeless tobacco: Never  Vaping Use   Vaping status: Never Used  Substance and Sexual Activity   Alcohol use: No    Alcohol/week: 0.0 standard drinks of alcohol   Drug use: No   Sexual activity: Not Currently  Other Topics Concern   Not on file  Social History Narrative    Lives with wife   Right Handed   Drinks 4-5 cups caffeine daily   Social Drivers of Health   Financial Resource Strain: Low Risk  (05/19/2024)   Overall Financial Resource Strain (CARDIA)    Difficulty of Paying Living Expenses: Not hard at all  Food Insecurity: No Food Insecurity (05/19/2024)   Hunger Vital Sign    Worried About Running Out of Food in the Last Year: Never true    Ran Out of Food in the Last Year: Never true  Transportation Needs: No Transportation Needs (05/19/2024)   PRAPARE - Administrator, Civil Service (Medical): No    Lack of Transportation (Non-Medical): No  Physical Activity: Inactive (05/19/2024)   Exercise Vital Sign    Days of Exercise per Week: 0 days    Minutes of Exercise per Session: 0 min  Stress: No Stress Concern Present (05/19/2024)   [employer] of Occupational Health - Occupational Stress Questionnaire    Feeling of Stress: Not at all  Social Connections: Moderately Integrated (05/19/2024)   Social Connection and Isolation Panel    Frequency of Communication with Friends and Family: More than three times a week    Frequency of Social Gatherings with Friends and Family: Twice a week    Attends Religious Services: Never    Database administrator or Organizations: Yes    Attends Banker Meetings: Never    Marital Status: Married    Tobacco Counseling Counseling given: Not Answered    Clinical Intake:  Pre-visit preparation completed: Yes  Pain : 0-10 Pain Score: 2  Pain Type: Chronic pain Pain Location: Ankle (both hands) Pain Descriptors / Indicators: Aching Pain Onset: More than a month ago Pain Frequency: Constant Pain Relieving Factors: meloxicam   Pain Relieving Factors: meloxicam   BMI - recorded: 42.68 Nutritional Status: BMI > 30  Obese Nutritional Risks: None Diabetes: Yes CBG done?: No Did pt. bring in CBG monitor from home?: No  Lab Results  Component Value Date   HGBA1C 7.9 (H) 03/22/2024    HGBA1C 7.0 (H) 11/18/2023   HGBA1C 6.4 05/20/2023     How often do you need to have someone  help you when you read instructions, pamphlets, or other written materials from your doctor or pharmacy?: 1 - Never  Interpreter Needed?: No  Comments: lives with wife Information entered by :: B.Aleera Gilcrease,LPN   Activities of Daily Living     05/19/2024    9:37 AM 03/12/2024    5:31 PM  In your present state of health, do you have any difficulty performing the following activities:  Hearing? 1 1  Vision? 0 0  Difficulty concentrating or making decisions? 0 1  Walking or climbing stairs? 1   Dressing or bathing? 0   Doing errands, shopping? 0 0  Preparing Food and eating ? N   Using the Toilet? N   In the past six months, have you accidently leaked urine? N   Do you have problems with loss of bowel control? N   Managing your Medications? N   Managing your Finances? N   Housekeeping or managing your Housekeeping? N     Patient Care Team: Corwin Antu, FNP as PCP - General (Family Medicine) [place], Peter M, MD as PCP - Cardiology (Cardiology) [place], Peter M, MD as Consulting Physician (Cardiology) Darlean Ozell NOVAK, MD as Consulting Physician (Pulmonary Disease) Myra Rosaline FALCON, [facility] (Inactive) as Pharmacist (Pharmacist) Myrna Adine Anes, MD as Consulting Physician (Ophthalmology)  I have updated your Care Teams any recent Medical Services you may have received from other providers in the past year.     Assessment:   This is a routine wellness examination for Alfred Carney.  Hearing/Vision screen Hearing Screening - Comments:: Patient denies any hearing difficulties.   Vision Screening - Comments:: Pt says their vision is good without glasses;readers only Dr  Myrna   Goals Addressed             This Visit's Progress    DIET - EAT MORE FRUITS AND VEGETABLES   On track    05/19/24     COMPLETED: Patient Stated       07/25/2018, I will continue to do my physical therapy exercises  everyday for about 30 minutes.      Patient Stated   On track    05/19/24-Maintain current health status.       Depression Screen     05/19/2024    9:34 AM 11/18/2023   11:40 AM 05/20/2023    9:22 AM 05/12/2023    9:53 AM 10/10/2022   12:36 PM 05/08/2022   10:36 AM 07/25/2020   12:07 PM  PHQ 2/9 Scores  PHQ - 2 Score 0 2 0 0 0 0 0  PHQ- 9 Score  7 1  0 0 0    Fall Risk     05/19/2024    9:32 AM 11/18/2023   11:40 AM 05/20/2023    9:22 AM 05/09/2023   12:41 PM 10/10/2022   12:36 PM  Fall Risk   Falls in the past year? 1 0 1 0 0  Number falls in past yr: 1 0 1 0 0  Injury with Fall? 0 0 0 0 0  Risk for fall due to : History of fall(s);Impaired balance/gait;Impaired mobility;Orthopedic patient No Fall Risks No Fall Risks No Fall Risks   Follow up Education provided;Falls prevention discussed Falls evaluation completed Falls evaluation completed Falls prevention discussed;Falls evaluation completed Falls evaluation completed;Education provided    MEDICARE RISK AT HOME:  Medicare Risk at Home Any stairs in or around the home?: Yes If so, are there any without handrails?: Yes Home free of loose throw rugs  in walkways, pet beds, electrical cords, etc?: Yes Adequate lighting in your home to reduce risk of falls?: Yes Life alert?: No Use of a cane, walker or w/c?: Yes Grab bars in the bathroom?: Yes Shower chair or bench in shower?: No Elevated toilet seat or a handicapped toilet?: Yes  TIMED UP AND GO:  Was the test performed?  No  Cognitive Function: 6CIT completed    07/25/2020   12:10 PM  MMSE - Mini Mental State Exam  Orientation to time 5  Orientation to Place 5  Registration 3  Attention/ Calculation 5  Recall 3  Language- repeat 1        05/19/2024    9:40 AM 05/12/2023   10:01 AM 05/08/2022   10:47 AM  6CIT Screen  What Year? 0 points 0 points 0 points  What month? 0 points 0 points 0 points  What time? 0 points 0 points 0 points  Count back from 20 0 points 0  points 0 points  Months in reverse 0 points 0 points 0 points  Repeat phrase 0 points 0 points 2 points  Total Score 0 points 0 points 2 points    Immunizations Immunization History  Administered Date(s) Administered   PFIZER(Purple Top)SARS-COV-2 Vaccination 11/17/2019, 12/08/2019   Pneumococcal Conjugate-13 01/24/2015   Pneumococcal Polysaccharide-23 09/18/2011, 11/12/2016   Tdap 06/26/2010    Screening Tests Health Maintenance  Topic Date Due   COVID-19 Vaccine (3 - Pfizer risk series) 01/05/2020   FOOT EXAM  05/03/2022   INFLUENZA VACCINE  Never done   OPHTHALMOLOGY EXAM  08/06/2024   HEMOGLOBIN A1C  09/22/2024   Diabetic kidney evaluation - Urine ACR  11/17/2024   Diabetic kidney evaluation - eGFR measurement  05/11/2025   Medicare Annual Wellness (AWV)  05/19/2025   Colonoscopy  01/23/2028   Pneumococcal Vaccine: 50+ Years  Completed   Hepatitis C Screening  Completed   HPV VACCINES  Aged Out   Meningococcal B Vaccine  Aged Out   DTaP/Tdap/Td  Discontinued   Zoster Vaccines- Shingrix  Discontinued    Health Maintenance  Health Maintenance Due  Topic Date Due   COVID-19 Vaccine (3 - Pfizer risk series) 01/05/2020   FOOT EXAM  05/03/2022   INFLUENZA VACCINE  Never done   Health Maintenance Items Addressed: None due at this time. Pt will receive vaccines at their pharmacy when decided to obtain   Additional Screening:  Vision Screening: Recommended annual ophthalmology exams for early detection of glaucoma and other disorders of the eye. Would you like a referral to an eye doctor? No    Dental Screening: Recommended annual dental exams for proper oral hygiene  Community Resource Referral / Chronic Care Management: CRR required this visit?  No   CCM required this visit?  Appt scheduled with PCP   Plan:    I have personally reviewed and noted the following in the patient's chart:   Medical and social history Use of alcohol, tobacco or illicit drugs   Current medications and supplements including opioid prescriptions. Patient is not currently taking opioid prescriptions. Functional ability and status Nutritional status Physical activity Advanced directives List of other physicians Hospitalizations, surgeries, and ER visits in previous 12 months Vitals Screenings to include cognitive, depression, and falls Referrals and appointments  In addition, I have reviewed and discussed with patient certain preventive protocols, quality metrics, and best practice recommendations. A written personalized care plan for preventive services as well as general preventive health recommendations were provided to patient.  Erminio LITTIE Saris, LPN   [id number]   After Visit Summary: (MyChart) Due to this being a telephonic visit, the after visit summary with patients personalized plan was offered to patient via MyChart   Notes: Please refer to Routing Comments.

## 2024-05-19 NOTE — Patient Instructions (Signed)
 Alfred Carney , Thank you for taking time out of your busy schedule to complete your Annual Wellness Visit with me. I enjoyed our conversation and look forward to speaking with you again next year. I, as well as your care team,  appreciate your ongoing commitment to your health goals. Please review the following plan we discussed and let me know if I can assist you in the future. Your Game plan/ To Do List    Referrals: If you haven't heard from the office you've been referred to, please reach out to them at the phone provided.   Follow up Visits: We will see or speak with you next year for your Next Medicare AWV with our clinical staff Have you seen your provider in the last 6 months (3 months if uncontrolled diabetes)? Yes  Clinician Recommendations:  Aim for 30 minutes of exercise or brisk walking, 6-8 glasses of water, and 5 servings of fruits and vegetables each day.       This is a list of the screenings recommended for you:  Health Maintenance  Topic Date Due   COVID-19 Vaccine (3 - Pfizer risk series) 01/05/2020   Complete foot exam   05/03/2022   Flu Shot  Never done   Eye exam for diabetics  08/06/2024   Hemoglobin A1C  09/22/2024   Yearly kidney health urinalysis for diabetes  11/17/2024   Yearly kidney function blood test for diabetes  05/11/2025   Medicare Annual Wellness Visit  05/19/2025   Colon Cancer Screening  01/23/2028   Pneumococcal Vaccine for age over 32  Completed   Hepatitis C Screening  Completed   HPV Vaccine  Aged Out   Meningitis B Vaccine  Aged Out   DTaP/Tdap/Td vaccine  Discontinued   Zoster (Shingles) Vaccine  Discontinued    Advanced directives: (Declined) Advance directive discussed with you today. Even though you declined this today, please call our office should you change your mind, and we can give you the proper paperwork for you to fill out. Advance Care Planning is important because it:  [x]  Makes sure you receive the medical care that is  consistent with your values, goals, and preferences  [x]  It provides guidance to your family and loved ones and reduces their decisional burden about whether or not they are making the right decisions based on your wishes.  Follow the link provided in your after visit summary or read over the paperwork we have mailed to you to help you started getting your Advance Directives in place. If you need assistance in completing these, please reach out to us  so that we can help you!

## 2024-05-20 ENCOUNTER — Ambulatory Visit: Payer: Self-pay

## 2024-05-20 NOTE — Telephone Encounter (Signed)
  FYI Only or Action Required?: Action required by provider: update on patient condition.  Patient was last seen in primary care on 05/10/2024 by Corwin Antu, FNP.  Called Nurse Triage reporting Dizziness.  Symptoms began today.  Interventions attempted: Rest, hydration, or home remedies.  Symptoms are: stable. Reports pt. Has been having dizziness on and off x 1 year. Not drinking water well. BP 84/44 then 96/90 after drinking. Taking Lasix  every morning. Has appointment next week. Stable now.  Triage Disposition: See PCP When Office is Open (Within 3 Days), See Physician Within 24 Hours  Patient/caregiver understands and will follow disposition?: Yes  Copied from CRM #[id number]. Topic: Clinical - Red Word Triage >> May 20, 2024  2:59 PM Jayma L wrote: Red Word that prompted transfer to Nurse Triage: [facility] is calling - nichole is on the line - seen patient for PT today, had a near fall due to dizzy. Blood pressure  Was 84/44 . Drank water and 15 mins later it was 96/60 . Only drinking 24 oz of water of day - asking to split up medicine .SABRA One in morning and afternoon .SABRA Been dizzy with standing off and on for over a year but not getting any better Reason for Disposition  Taking a medicine that could cause dizziness (e.g., blood pressure medications, diuretics)  [1] MODERATE dizziness (e.g., interferes with normal activities) AND [2] has been evaluated by doctor (or NP/PA) for this  Answer Assessment - Initial Assessment Questions 1. DESCRIPTION: Describe your dizziness.     dizzy 2. LIGHTHEADED: Do you feel lightheaded? (e.g., somewhat faint, woozy, weak upon standing)     woozy 3. VERTIGO: Do you feel like either you or the room is spinning or tilting? (i.e., vertigo)     no 4. SEVERITY: How bad is it?  Do you feel like you are going to faint? Can you stand and walk?     yes 5. ONSET:  When did the dizziness begin?     1 year 6. AGGRAVATING FACTORS: Does anything  make it worse? (e.g., standing, change in head position)     standing 7. HEART RATE: Can you tell me your heart rate? How many beats in 15 seconds?  (Note: Not all patients can do this.)       No   Bp 96/90 8. CAUSE: What do you think is causing the dizziness? (e.g., decreased fluids or food, diarrhea, emotional distress, heat exposure, new medicine, sudden standing, vomiting; unknown)     Not drinking well 9. RECURRENT SYMPTOM: Have you had dizziness before? If Yes, ask: When was the last time? What happened that time?     yes 10. OTHER SYMPTOMS: Do you have any other symptoms? (e.g., fever, chest pain, vomiting, diarrhea, bleeding)       no 11. PREGNANCY: Is there any chance you are pregnant? When was your last menstrual period?       N/a  Protocols used: Dizziness - Lightheadedness-A-AH

## 2024-05-20 NOTE — Telephone Encounter (Signed)
 Called pt and relayed information,  Pt verbalized understanding and repeated back to me. He has no questions or concerns at this time. Pt has BP cuff at home and was advised to check BP.  States he cannot be seen tomorrow but has scheduled appointment for Monday at 10:40am

## 2024-05-20 NOTE — Telephone Encounter (Signed)
 Can patient check his blood pressure at home? He needs to try and increase his fluid intake to approx 50 oz a day  He can hold the tamsulsoin until he is seen in office as this can increase orthostatics.    If he can check his blood pressure at home and the top number is under 100 then he needs to hold his irbesartan . If above 100 he can take it as prescribed  He needs to be seen before the 10th. Tomorrow Ideally if someone has an opening

## 2024-05-21 NOTE — Telephone Encounter (Signed)
 Noted. I have spoke to patients PCP

## 2024-05-21 NOTE — Telephone Encounter (Signed)
 Thank you Matt for your recommendations. I agree with plan of care.  Also want pt to follow up with cardiology.  Pt has appt Monday with Carrol Aurora, I have relayed information on to her as well.

## 2024-05-22 ENCOUNTER — Other Ambulatory Visit: Payer: Self-pay | Admitting: Family

## 2024-05-22 DIAGNOSIS — I5032 Chronic diastolic (congestive) heart failure: Secondary | ICD-10-CM

## 2024-05-24 ENCOUNTER — Ambulatory Visit: Admitting: General Practice

## 2024-05-26 ENCOUNTER — Telehealth: Payer: Self-pay | Admitting: Family

## 2024-05-26 ENCOUNTER — Encounter: Payer: Self-pay | Admitting: Family

## 2024-05-26 ENCOUNTER — Ambulatory Visit (INDEPENDENT_AMBULATORY_CARE_PROVIDER_SITE_OTHER): Admitting: Family

## 2024-05-26 VITALS — BP 102/62 | HR 80 | Temp 98.4°F | Wt 288.0 lb

## 2024-05-26 DIAGNOSIS — J189 Pneumonia, unspecified organism: Secondary | ICD-10-CM | POA: Diagnosis not present

## 2024-05-26 DIAGNOSIS — M150 Primary generalized (osteo)arthritis: Secondary | ICD-10-CM

## 2024-05-26 DIAGNOSIS — R42 Dizziness and giddiness: Secondary | ICD-10-CM

## 2024-05-26 DIAGNOSIS — R944 Abnormal results of kidney function studies: Secondary | ICD-10-CM | POA: Diagnosis not present

## 2024-05-26 DIAGNOSIS — M130 Polyarthritis, unspecified: Secondary | ICD-10-CM

## 2024-05-26 DIAGNOSIS — I5032 Chronic diastolic (congestive) heart failure: Secondary | ICD-10-CM | POA: Diagnosis not present

## 2024-05-26 DIAGNOSIS — J449 Chronic obstructive pulmonary disease, unspecified: Secondary | ICD-10-CM | POA: Diagnosis not present

## 2024-05-26 DIAGNOSIS — N183 Chronic kidney disease, stage 3 unspecified: Secondary | ICD-10-CM

## 2024-05-26 NOTE — Telephone Encounter (Signed)
 This is the pt I was asking apart in regards to taking tramadol . With comorbidities and medications would this be ok/

## 2024-05-26 NOTE — Progress Notes (Signed)
 Established Patient Office Visit  Subjective:      CC:  Chief Complaint  Patient presents with   Hospitalization Follow-up    HPI: Alfred Carney is a 73 y.o. male presenting on 05/26/2024 for Hospitalization Follow-up .  Discussed the use of AI scribe software for clinical note transcription with the patient, who gave verbal consent to proceed.  History of Present Illness Alfred Carney is a 73 year old male with COPD and hypoxia who presents for follow-up of pneumonia.  He was recently hospitalized on August 26th for pneumonia in the left lower lung. During his hospital stay, an ultrasound and CT scan ruled out blood clots in the lungs. The CT scan showed a small area of suspected pneumonia and progressive changes of centrilobular emphysema. He was discharged with doxycycline  100 mg twice a day for ten days, which he completed. He feels slightly better, though he still experiences dizziness and fatigue, particularly when getting up and moving around.  He has a history of COPD with hypoxia and is on two liters of nasal oxygen . Recent imaging showed interval patchy opacity in the left lower lobe and an increase in size of a previously demonstrated benign left lower lobe nodule, which is now calcified. His BNP was mildly elevated at 126, consistent with his stable elevation, and his creatinine was mildly elevated. No urinary symptoms such as frequency or urgency, and he has been increasing his water intake.  He reports elevated blood pressure readings at home, with the highest being 152/92 and the lowest 134/83. He attributes the elevated readings to not resting before taking them. His heart rate has been in the 60 to 80 range. He has lost three pounds since September 8th.  He was recently discharged from a rehabilitation facility, [facility], where he stayed for about two weeks. He was sent home with medications he had been taking in the hospital, including tramadol  for pain, which he  uses when his pain is severe, typically once or twice a week. He has three pills left and uses it when meloxicam  does not suffice. He experiences pain rated at seven or eight, primarily due to arthritis.  He has been receiving home health visits and physical therapy, although it has been challenging to complete sessions due to his physical condition. He almost fell recently, which prevented a physical therapy session from taking place.  No burning during urination and no new urinary symptoms. He experiences dizziness and fatigue, particularly when moving around. No fever or worsening of symptoms since his last hospital visit.   Wt Readings from Last 3 Encounters:  05/26/24 288 lb (130.6 kg)  05/19/24 289 lb (131.1 kg)  05/11/24 289 lb 14.5 oz (131.5 kg)   Temp Readings from Last 3 Encounters:  05/26/24 98.4 F (36.9 C) (Temporal)  05/11/24 97.7 F (36.5 C) (Oral)  05/10/24 97.9 F (36.6 C) (Temporal)   BP Readings from Last 3 Encounters:  05/26/24 102/62  05/11/24 (!) 134/47  05/10/24 106/70   Pulse Readings from Last 3 Encounters:  05/26/24 80  05/11/24 65  05/10/24 100      Wt Readings from Last 3 Encounters:  05/26/24 288 lb (130.6 kg)  05/19/24 289 lb (131.1 kg)  05/11/24 289 lb 14.5 oz (131.5 kg)      Social history:  Relevant past medical, surgical, family and social history reviewed and updated as indicated. Interim medical history since our last visit reviewed.  Allergies and medications reviewed and updated.  DATA REVIEWED: CHART IN EPIC  ROS: Negative unless specifically indicated above in HPI.    Current Outpatient Medications:    albuterol  (VENTOLIN  HFA) 108 (90 Base) MCG/ACT inhaler, Inhale 2 puffs into the lungs every 4 (four) hours as needed for wheezing or shortness of breath., Disp: 8 g, Rfl: 2   allopurinol  (ZYLOPRIM ) 100 MG tablet, Take 2 tablets (200 mg total) by mouth daily., Disp: 180 tablet, Rfl: 3   aspirin  EC 81 MG tablet, Take 1  tablet (81 mg total) by mouth daily. Swallow whole., Disp: , Rfl:    Calcium  Carbonate-Simethicone  750-80 MG CHEW, Chew 1 tablet by mouth as needed (gas). , Disp: , Rfl:    Cholecalciferol  (VITAMIN D  PO), Take 1,000 Units by mouth daily., Disp: , Rfl:    furosemide  (LASIX ) 20 MG tablet, Take 2 tablets by mouth once daily, Disp: 60 tablet, Rfl: 0   glipiZIDE  (GLUCOTROL ) 5 MG tablet, TAKE 1 TABLET BY MOUTH TWICE DAILY BEFORE A MEAL, Disp: 180 tablet, Rfl: 3   glucose blood test strip, Use as instructed, Disp: 100 each, Rfl: 12   irbesartan  (AVAPRO ) 150 MG tablet, Take 1 tablet (150 mg total) by mouth daily., Disp: 90 tablet, Rfl: 0   Lancet Device MISC, 1 Lancet by Does not apply route daily as needed., Disp: 100 each, Rfl: 3   lidocaine  (LIDODERM ) 5 %, Place 3 patches onto the skin daily. To right knee and both ankles, Disp: , Rfl:    meloxicam  (MOBIC ) 7.5 MG tablet, Take 1 tablet by mouth once daily, Disp: 90 tablet, Rfl: 3   metoprolol  tartrate (LOPRESSOR ) 25 MG tablet, Take 25 mg by mouth daily in the afternoon., Disp: , Rfl:    omeprazole  (PRILOSEC) 20 MG capsule, Take 1 capsule (20 mg total) by mouth daily., Disp: 30 capsule, Rfl: 3   OXYGEN , Inhale 2 L into the lungs continuous. , Disp: , Rfl:    pravastatin  (PRAVACHOL ) 40 MG tablet, Take 1 tablet (40 mg total) by mouth daily., Disp: 90 tablet, Rfl: 3   tacrolimus (PROTOPIC) 0.1 % ointment, Apply topically 2 (two) times daily. 2 weeks, Disp: , Rfl:    tamsulosin  (FLOMAX ) 0.4 MG CAPS capsule, Take 1 capsule by mouth at bedtime, Disp: 90 capsule, Rfl: 1   tirzepatide  (MOUNJARO ) 2.5 MG/0.5ML Pen, Inject 2.5 mg into the skin once a week., Disp: 2 mL, Rfl: 0        Objective:        BP 102/62 (BP Location: Left Arm, Patient Position: Sitting, Cuff Size: Large)   Pulse 80   Temp 98.4 F (36.9 C) (Temporal)   Wt 288 lb (130.6 kg)   SpO2 95% Comment: 2L pulse  BMI 42.53 kg/m   Physical Exam CHEST: Tiny crackle in left lower lung  base.  Wt Readings from Last 3 Encounters:  05/26/24 288 lb (130.6 kg)  05/19/24 289 lb (131.1 kg)  05/11/24 289 lb 14.5 oz (131.5 kg)    Physical Exam Vitals reviewed.  Constitutional:      General: He is not in acute distress.    Appearance: Normal appearance. He is normal weight. He is not ill-appearing, toxic-appearing or diaphoretic.  Cardiovascular:     Rate and Rhythm: Normal rate and regular rhythm.  Pulmonary:     Effort: Pulmonary effort is normal.     Breath sounds: Examination of the left-lower field reveals rales. Rales present.  Musculoskeletal:        General: Normal range of motion.  Neurological:     General:  No focal deficit present.     Mental Status: He is alert and oriented to person, place, and time. Mental status is at baseline.  Psychiatric:        Mood and Affect: Mood normal.        Behavior: Behavior normal.        Thought Content: Thought content normal.        Judgment: Judgment normal.          Results LABS - Troponin: Not elevated - BNP: 126 - Creatinine: Mildly elevated - WBC: No leukocytosis - GFR: 39  RADIOLOGY - Ultrasound (05/11/2024): No blood clots - CT scan (05/11/2024): Negative for pulmonary embolism, small area of suspected pneumonia - Chest X-ray: Minor basilar atelectasis versus scarring - CTA: Atheromatous calcifications, mildly enlarged heart, thyroid  gland without abnormalities, progressive changes of centrilobular emphysema, interval patchy opacity in left lower lobe, small calcified granuloma, interval increase in size of benign left lower lobe nodule with central calcification  Assessment & Plan:   Assessment and Plan Assessment & Plan Pneumonia, left lower lobe Interval patchy opacity in the left lower lobe consistent with pneumonia. Symptoms are improving, but there is still a tiny crackle in the left lower base. Completed a course of doxycycline . No fever or worsening symptoms reported. - Repeat chest x-ray in  5 weeks to monitor resolution. - Monitor for any worsening symptoms such as fever, increased dyspnea, or malaise.  Chronic obstructive pulmonary disease with hypoxia Known COPD with hypoxia. Continues on 2 liters of oxygen . Recent imaging showed progressive changes of centrilobular emphysema. No acute exacerbation noted.  Chronic diastolic congestive heart failure BNP was elevated at 126, which is stable for him. Mildly enlarged heart noted on imaging. No acute decompensation observed. - Follow up with cardiology as soon as possible.  Chronic kidney disease stage 3 Creatinine mildly elevated with a filtration rate of 39. No signs of urinary tract infection. Possible dehydration noted. - Increase water intake to prevent dehydration. - Provide urine sample for further evaluation if symptoms suggestive of UTI develop.  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 Previously used tramadol  for severe pain, but concerns about use with oxygen  therapy. Pain level reported as 7-8 at times. - Consult with pharmacist regarding the safety of tramadol  use with oxygen  therapy.  Dizziness and orthostatic symptoms Dizziness persists, especially upon standing. Blood pressure readings elevated, likely due to not resting before measurement. - Ensure blood pressure is measured after resting for 10 minutes to obtain accurate readings.  Recording duration: 14 minutes      Return in about 5 weeks (around 06/30/2024) for f/u pneumonia .     Ginger Patrick, MSN, APRN, FNP-C [facility]

## 2024-05-27 NOTE — Telephone Encounter (Signed)
 For regular voltaren gel use would be this be an issue with ASA, anticoag considering topical?

## 2024-05-27 NOTE — Telephone Encounter (Signed)
 Referral placed 9/2 for nephrology  Can we route to Dr. Dennise? Pt preference

## 2024-06-01 ENCOUNTER — Ambulatory Visit: Admitting: Nurse Practitioner

## 2024-06-01 MED ORDER — TRAMADOL HCL 50 MG PO TABS: 50.0000 mg | ORAL_TABLET | Freq: Two times a day (BID) | ORAL | 0 refills | Status: AC | PRN

## 2024-06-01 NOTE — Telephone Encounter (Signed)
 Can you see message below fr pt forgot to route it to you

## 2024-06-01 NOTE — Telephone Encounter (Signed)
 Spoke with pt and she is aware of Tabitha's message. States that he has arthritis all over his body and flares up some times. He would be willing to try tramadol  if Tabitha thinks it's okay.

## 2024-06-01 NOTE — Addendum Note (Signed)
 Addended by: CORWIN ANTU on: 06/01/2024 08:01 AM   Modules accepted: Orders

## 2024-06-01 NOTE — Telephone Encounter (Signed)
 In regards to the pain regular daily use of voltaren would be appropriate, studies show that used over time voltaren is more effective vs intermittent use.   Also what is the pain control for? Is it for osteoarthritis?  I will put into consideration for tramadol  only to be used rarely.  There is a risk of respiratory depression especially with you being on oxygen  so I want to use this with extreme caution, and do not take with any additional sedative type medications.

## 2024-06-03 ENCOUNTER — Telehealth: Payer: Self-pay | Admitting: Family

## 2024-06-03 NOTE — Progress Notes (Unsigned)
 Advanced Heart Failure Clinic Note   Referring Physician: admission PCP: Corwin Antu, FNP Cardiologist: Peter [place], MD   Chief Complaint: shortness of breath   HPI:  Alfred Carney is a 73 y/o male with a history of non-insulin -dependent diabetes mellitus, PAD, hyperlipidemia, hypertension, history of gout, BPH, OSA (non compliant w/ CPAP), COPD on chronic 2 L nasal cannula at baseline, lumbar spine DJD with stenosis, venous stasis, CKD, AF/ RVR (06/25), paroxsymal atrial tachycardia and HFpEF.   Pertinent cardiac history: Normal Lexiscan  Myoview  in 09/2011. Echo in 10/2018 noted LVEF of 60-65%. Echo in 05/2020 with LVEF unchanged and grade I diastolic dysfunction.  Admitted 03/11/24 with weakness over ~2 weeks and recent fall. On admission, BNP was 116.1, HS-troponin was 16, lactic acid 1.2, uric acid 5.5, and TSH was 1.339. Chest x-ray noted emphysema. Initial EKG showed atrial fibrillation however rhythm strip evaluated by cardiology showed PACs with sinus tachycardia.  [facility] cardiology input noted, favored secondary to untreated sleep apnea/obesity hypoventilation syndrome. Echo 03/12/24: LVEF of 55-60%, and grade I diastolic dysfunction. Started on cardizem  and given IV lasix . Abnormal UA and with weakness so treated with three doses of Rocephin . Urine culture negative.   He presents today, with his daughter, from [facility] with a chief complaint of shortness of breath Has associated fatigue, leg weakness, pedal edema (improving). He says that he's been feeling pretty good up until today where he's been experiencing more shortness of breath. He's been receiving daily PT. Not getting weighed daily at facility.  He says that he fell earlier today when he bent over at the waist, he continued on over. Denies dizziness or syncope leading up to this event. He says that his legs get weak and he just goes on over.    Review of Systems: [y] = yes, [ ]  = no   General: Weight gain [  ]; Weight loss [ ] ; Anorexia [ ] ; Fatigue Davis.Dad ]; Fever [ ] ; Chills [ ] ; Weakness Davis.Dad ]  Cardiac: Chest pain/pressure [ ] ; Resting SOB [ ] ; Exertional SOB Davis.Dad ]; Orthopnea [ ] ; Pedal Edema Davis.Dad ]; Palpitations [ ] ; Syncope [ ] ; Presyncope [ ] ; Paroxysmal nocturnal dyspnea[ ]   Pulmonary: Cough [ ] ; Wheezing[ ] ; Hemoptysis[ ] ; Sputum [ ] ; Snoring [ ]   GI: Vomiting[ ] ; Dysphagia[ ] ; Melena[ ] ; Hematochezia [ ] ; Heartburn[ ] ; Abdominal pain [ ] ; Constipation [ ] ; Diarrhea [ ] ; BRBPR [ ]   GU: Hematuria[ ] ; Dysuria [ ] ; Nocturia[ ]   Vascular: Pain in legs with walking [ ] ; Pain in feet with lying flat [ ] ; Non-healing sores [ ] ; Stroke [ ] ; TIA [ ] ; Slurred speech [ ] ;  Neuro: Headaches[ ] ; Vertigo[ ] ; Seizures[ ] ; Paresthesias[ ] ;Blurred vision [ ] ; Diplopia [ ] ; Vision changes [ ]   Ortho/Skin: Arthritis Davis.Dad ]; Joint pain [ ] ; Muscle pain [ ] ; Joint swelling [ ] ; Back Pain [ ] ; Rash [ ]   Psych: Depression[ ] ; Anxiety[ ]   Heme: Bleeding problems [ ] ; Clotting disorders [ ] ; Anemia [ ]   Endocrine: Diabetes Davis.Dad ]; Thyroid  dysfunction[ ]    Past Medical History:  Diagnosis Date   Chronic obstructive pulmonary disease (HCC) 2008   Moderate   Colon polyps    Community acquired pneumonia    Coronary artery disease    Degenerative joint disease    Diabetes mellitus without complication (HCC)    type 2   Emphysema lung (HCC)    Enlarged liver    fatty liver by '09 CT  Gastroesophageal reflux disease    Gout    H/O hiatal hernia    History of [place] spotted fever    Possible history of [place] Spotted Fever   Hyperlipidemia    Hypertension    Hypokalemia    diuretic induced, resolved   Microcytic anemia    iron pills   Morbid obesity (HCC)    Shortness of breath    Skin cancer    skin cancer lip removed   Sleep apnea    not currently using CPAP 05/20/13   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 8 g 2   allopurinol  (ZYLOPRIM ) 100 MG tablet Take 2 tablets (200 mg total) by mouth daily. 180 tablet 3   aspirin  EC 81 MG tablet Take 1 tablet (81 mg total) by mouth daily. Swallow whole.     Calcium  Carbonate-Simethicone  750-80 MG CHEW Chew 1 tablet by mouth as needed (gas).      Cholecalciferol  (VITAMIN D  PO) Take 1,000 Units by mouth daily.     furosemide  (LASIX ) 20 MG tablet Take 2 tablets by mouth once daily 60 tablet 0   glipiZIDE  (GLUCOTROL ) 5 MG tablet TAKE 1 TABLET BY MOUTH TWICE DAILY BEFORE A MEAL 180 tablet 3   glucose blood test strip Use as instructed 100 each 12   irbesartan  (AVAPRO ) 150 MG tablet Take 1 tablet (150 mg total) by mouth daily. 90 tablet 0   Lancet Device MISC 1 Lancet by Does not apply route daily as needed. 100 each 3   lidocaine  (LIDODERM ) 5 % Place 3 patches onto the skin daily. To right knee and both ankles     meloxicam  (MOBIC ) 7.5 MG tablet Take 1 tablet by mouth once daily 90 tablet 3   metoprolol  tartrate (LOPRESSOR ) 25 MG tablet Take 25 mg by mouth daily in the afternoon.     omeprazole  (PRILOSEC) 20 MG capsule Take 1 capsule (20 mg total) by mouth daily. 30 capsule 3   OXYGEN  Inhale 2 L into the lungs continuous.      pravastatin  (PRAVACHOL ) 40 MG tablet Take 1 tablet (40 mg total) by mouth daily. 90 tablet 3   tacrolimus (PROTOPIC) 0.1 % ointment Apply topically 2 (two) times daily. 2 weeks     tamsulosin  (FLOMAX ) 0.4 MG CAPS capsule Take 1 capsule by mouth at bedtime 90 capsule 1   tirzepatide  (MOUNJARO ) 2.5 MG/0.5ML Pen Inject 2.5 mg into the skin once a week. 2 mL 0   traMADol  (ULTRAM ) 50 MG tablet Take 1 tablet (50 mg total) by mouth 2 (two) times daily as needed for up to 5 days for severe pain (pain score 7-10). 10 tablet 0   No current facility-administered medications for this  visit.    Allergies  Allergen Reactions   Jardiance  [Empagliflozin ] Other (See Comments)    Stomach upset      Social History   Socioeconomic History   Marital status: Married    Spouse name: [name]   Number of children: 4   Years of education: GED, ITT   Highest education level: Associate degree: occupational, Scientist, product/process development, or vocational program  Occupational History   Occupation: architectural drawing-retired  Tobacco Use   Smoking status: Former    Current packs/day:  0.00    Average packs/day: 1 pack/day for 35.0 years (35.0 ttl pk-yrs)    Types: Cigarettes    Start date: 09/17/1971    Quit date: 09/16/2006    Years since quitting: 17.7   Smokeless tobacco: Never  Vaping Use   Vaping status: Never Used  Substance and Sexual Activity   Alcohol use: No    Alcohol/week: 0.0 standard drinks of alcohol   Drug use: No   Sexual activity: Not Currently  Other Topics Concern   Not on file  Social History Narrative   Lives with wife   Right Handed   Drinks 4-5 cups caffeine daily   Social Drivers of Health   Financial Resource Strain: Low Risk  (05/19/2024)   Overall Financial Resource Strain (CARDIA)    Difficulty of Paying Living Expenses: Not hard at all  Food Insecurity: No Food Insecurity (05/19/2024)   Hunger Vital Sign    Worried About Running Out of Food in the Last Year: Never true    Ran Out of Food in the Last Year: Never true  Transportation Needs: No Transportation Needs (05/19/2024)   PRAPARE - Administrator, Civil Service (Medical): No    Lack of Transportation (Non-Medical): No  Physical Activity: Inactive (05/19/2024)   Exercise Vital Sign    Days of Exercise per Week: 0 days    Minutes of Exercise per Session: 0 min  Stress: No Stress Concern Present (05/19/2024)   [employer] of Occupational Health - Occupational Stress Questionnaire    Feeling of Stress: Not at all  Social Connections: Moderately Integrated (05/19/2024)   Social Connection  and Isolation Panel    Frequency of Communication with Friends and Family: More than three times a week    Frequency of Social Gatherings with Friends and Family: Twice a week    Attends Religious Services: Never    Database administrator or Organizations: Yes    Attends Banker Meetings: Never    Marital Status: Married  Catering manager Violence: Not At Risk (05/19/2024)   Humiliation, Afraid, Rape, and Kick questionnaire    Fear of Current or Ex-Partner: No    Emotionally Abused: No    Physically Abused: No    Sexually Abused: No      Family History  Problem Relation Age of Onset   Osteoarthritis Mother    Diabetes Mother    Pulmonary embolism Mother    Heart disease Father        Coronary Artery Disease   Stroke Father    Multiple sclerosis Sister    Factor V Leiden deficiency Sister    Emphysema Sister    Hyperlipidemia Brother    Heart attack Maternal Grandmother    Lung cancer Maternal Grandfather        smoked    There were no vitals filed for this visit.  Wt Readings from Last 3 Encounters:  05/26/24 288 lb (130.6 kg)  05/19/24 289 lb (131.1 kg)  05/11/24 289 lb 14.5 oz (131.5 kg)   Lab Results  Component Value Date   CREATININE 1.80 (H) 05/11/2024   CREATININE 1.74 (H) 05/10/2024   CREATININE 2.07 (H) 04/09/2024    PHYSICAL EXAM:  General: Well appearing obese male in wheelchair. No resp difficulty HEENT: normal Neck: supple, no JVD Cor: Regular rhythm, rate. No rubs, gallops or murmurs Lungs: clear Abdomen: soft, nontender, nondistended. Extremities: no cyanosis, clubbing, rash, trace pitting edema bilaterally to shins Neuro: alert & oriented X  3. Moves all 4 extremities w/o difficulty. Affect pleasant   ECG: NSR with HR 90 (personally reviewed)   ASSESSMENT & PLAN:  1: NICM with preserved ejection fraction- - suspect due to untreated OSA/ PAT - NYHA class III - euvolemic - order written for daily weights at facility and to  call for overnight weight gain of > 2 pounds - Echo 03/12/24: LVEF of 55-60%, and grade I diastolic dysfunction. - continue furosemide  40mg  daily - continue irbesartan  300mg  daily - begin jardiance  10mg  daily - BP may not be able to tolerate MRA without reduction in irbesartan  - SNF facility called and CMP was drawn earlier today but no results at this time; they will fax results to us  once they receive them - order for BMET to be drawn 04/12/24 and results faxed to us  - BNP 03/11/24 was 116.1  2: HTN- - BP 109/54 - seeing PCP at SNF - BMET 03/22/24 reviewed: sodium 134, potassium 5.1, creatinine 1.31 & GFR 57  3: DM- - A1c 03/22/24 was 7.9% - continue glipizide , insulin  - if glucose starts to get too low with the addition of jardiance , would prefer to stop glipizide   4: Hyperlipidemia-  - LDL 11/18/23 was 67 - continue pravastatin  40mg  daily  5: AF/ PAT- - EKG today is NSR - continue diltiazem  120mg  daily - continue metoprolol  succinate 25mg  daily  6: OSA- - noncompliant with CPAP as he says that he sleeps better without wearing it  7: COPD- - sees pulmonology [place]) 08/25 - wearing oxygen  at 2L around the clock. When he arrived, he was visibly SOB and asking about turning his oxygen  up. Pulse ox was 95%. When tubing was checked, it was kinked completely off where it attaches to the tank. New tubing applied and patient says that he can feel the oxygen  blowing now. By the end of the visit, he was more comfortable and said that he was breathing better.    Return in 3-4 weeks, sooner if needed.   Ellouise DELENA Class, FNP 06/03/24

## 2024-06-03 NOTE — Telephone Encounter (Signed)
 Called to confirm/remind patient of their appointment at the [facility] on 06/03/24.   Appointment:   [x] Confirmed  [] Left mess   [] No answer/No voice mail  [] VM Full/unable to leave message  [] Phone not in service  Patient reminded to bring all medications and/or complete list.  Confirmed patient has transportation. Gave directions, instructed to utilize valet parking.

## 2024-06-04 ENCOUNTER — Ambulatory Visit: Attending: Family | Admitting: Family

## 2024-06-04 ENCOUNTER — Encounter: Payer: Self-pay | Admitting: Family

## 2024-06-04 VITALS — BP 142/92 | HR 76 | Wt 291.5 lb

## 2024-06-04 DIAGNOSIS — N189 Chronic kidney disease, unspecified: Secondary | ICD-10-CM | POA: Insufficient documentation

## 2024-06-04 DIAGNOSIS — I130 Hypertensive heart and chronic kidney disease with heart failure and stage 1 through stage 4 chronic kidney disease, or unspecified chronic kidney disease: Secondary | ICD-10-CM | POA: Diagnosis not present

## 2024-06-04 DIAGNOSIS — Z7984 Long term (current) use of oral hypoglycemic drugs: Secondary | ICD-10-CM | POA: Diagnosis not present

## 2024-06-04 DIAGNOSIS — R5383 Other fatigue: Secondary | ICD-10-CM | POA: Diagnosis present

## 2024-06-04 DIAGNOSIS — Z7985 Long-term (current) use of injectable non-insulin antidiabetic drugs: Secondary | ICD-10-CM | POA: Insufficient documentation

## 2024-06-04 DIAGNOSIS — J449 Chronic obstructive pulmonary disease, unspecified: Secondary | ICD-10-CM | POA: Diagnosis not present

## 2024-06-04 DIAGNOSIS — E1169 Type 2 diabetes mellitus with other specified complication: Secondary | ICD-10-CM

## 2024-06-04 DIAGNOSIS — N1831 Chronic kidney disease, stage 3a: Secondary | ICD-10-CM

## 2024-06-04 DIAGNOSIS — M069 Rheumatoid arthritis, unspecified: Secondary | ICD-10-CM | POA: Insufficient documentation

## 2024-06-04 DIAGNOSIS — I10 Essential (primary) hypertension: Secondary | ICD-10-CM | POA: Diagnosis not present

## 2024-06-04 DIAGNOSIS — M47816 Spondylosis without myelopathy or radiculopathy, lumbar region: Secondary | ICD-10-CM | POA: Diagnosis not present

## 2024-06-04 DIAGNOSIS — Z87891 Personal history of nicotine dependence: Secondary | ICD-10-CM | POA: Insufficient documentation

## 2024-06-04 DIAGNOSIS — Z7982 Long term (current) use of aspirin: Secondary | ICD-10-CM | POA: Insufficient documentation

## 2024-06-04 DIAGNOSIS — I480 Paroxysmal atrial fibrillation: Secondary | ICD-10-CM | POA: Insufficient documentation

## 2024-06-04 DIAGNOSIS — I428 Other cardiomyopathies: Secondary | ICD-10-CM | POA: Insufficient documentation

## 2024-06-04 DIAGNOSIS — I5032 Chronic diastolic (congestive) heart failure: Secondary | ICD-10-CM | POA: Diagnosis not present

## 2024-06-04 DIAGNOSIS — Z9981 Dependence on supplemental oxygen: Secondary | ICD-10-CM | POA: Insufficient documentation

## 2024-06-04 DIAGNOSIS — E1122 Type 2 diabetes mellitus with diabetic chronic kidney disease: Secondary | ICD-10-CM | POA: Diagnosis not present

## 2024-06-04 DIAGNOSIS — N400 Enlarged prostate without lower urinary tract symptoms: Secondary | ICD-10-CM | POA: Diagnosis not present

## 2024-06-04 DIAGNOSIS — G4733 Obstructive sleep apnea (adult) (pediatric): Secondary | ICD-10-CM | POA: Diagnosis not present

## 2024-06-04 DIAGNOSIS — M48061 Spinal stenosis, lumbar region without neurogenic claudication: Secondary | ICD-10-CM | POA: Diagnosis not present

## 2024-06-04 DIAGNOSIS — E785 Hyperlipidemia, unspecified: Secondary | ICD-10-CM | POA: Diagnosis not present

## 2024-06-04 DIAGNOSIS — Z794 Long term (current) use of insulin: Secondary | ICD-10-CM

## 2024-06-04 DIAGNOSIS — E1151 Type 2 diabetes mellitus with diabetic peripheral angiopathy without gangrene: Secondary | ICD-10-CM | POA: Diagnosis not present

## 2024-06-04 DIAGNOSIS — R0602 Shortness of breath: Secondary | ICD-10-CM | POA: Diagnosis present

## 2024-06-04 DIAGNOSIS — I4719 Other supraventricular tachycardia: Secondary | ICD-10-CM

## 2024-06-04 LAB — BASIC METABOLIC PANEL WITH GFR
BUN/Creatinine Ratio: 13 (ref 10–24)
BUN: 14 mg/dL (ref 8–27)
CO2: 24 mmol/L (ref 20–29)
Calcium: 9.7 mg/dL (ref 8.6–10.2)
Chloride: 97 mmol/L (ref 96–106)
Creatinine, Ser: 1.1 mg/dL (ref 0.76–1.27)
Glucose: 191 mg/dL — ABNORMAL HIGH (ref 70–99)
Potassium: 3.9 mmol/L (ref 3.5–5.2)
Sodium: 140 mmol/L (ref 134–144)
eGFR: 70 mL/min/1.73 (ref >59)

## 2024-06-04 MED ORDER — SPIRONOLACTONE 25 MG PO TABS: 12.5000 mg | ORAL_TABLET | Freq: Every day | ORAL | 3 refills | Status: DC

## 2024-06-04 NOTE — Patient Instructions (Addendum)
 It was good to see you today!  Medication Changes:  START Spironolactone  12.5mg  (1/2 tab) daily  Lab Work:  Go downstairs to [place] on LOWER LEVEL to have your blood work completed.  We will only call you if the results are abnormal or if the provider would like to make medication changes.  No news is good news.   Special Instructions // Education:  Please have your blood work drawn at your PCP at [facility]  Follow-Up in: Please follow up with the [facility] in 2 months with Ellouise Class, FNP   Thank you for choosing [facility].    At the [facility], you and your health needs are our priority. We have a designated team specialized in the treatment of Heart Failure. This Care Team includes your primary Heart Failure Specialized Cardiologist (physician), Advanced Practice Providers (APPs- Physician Assistants and Nurse Practitioners), and Pharmacist who all work together to provide you with the care you need, when you need it.   You may see any of the following providers on your designated Care Team at your next follow up:  Dr. Toribio Fuel Dr. Ezra Shuck Dr. Ria Commander Dr. Morene Brownie Ellouise Class, FNP Jaun Bash, RPH-CPP  Please be sure to bring in all your medications bottles to every appointment.   Need to Contact Us :  If you have any questions or concerns before your next appointment please send us  a message through [place] or call our office at [phone].    TO LEAVE A MESSAGE FOR THE NURSE SELECT OPTION 2, PLEASE LEAVE A MESSAGE INCLUDING: YOUR NAME DATE OF BIRTH CALL BACK NUMBER REASON FOR CALL**this is important as we prioritize the call backs  YOU WILL RECEIVE A CALL BACK THE SAME DAY AS LONG AS YOU CALL BEFORE 4:00 PM

## 2024-06-06 ENCOUNTER — Ambulatory Visit: Payer: Self-pay | Admitting: Family

## 2024-06-11 ENCOUNTER — Other Ambulatory Visit

## 2024-06-11 DIAGNOSIS — I5032 Chronic diastolic (congestive) heart failure: Secondary | ICD-10-CM | POA: Diagnosis not present

## 2024-06-11 NOTE — Addendum Note (Signed)
 Addended by: ISADORA RAISIN on: 06/11/2024 01:54 PM   Modules accepted: Orders

## 2024-06-14 NOTE — Progress Notes (Unsigned)
 Subjective:   Patient ID: Alfred Carney, male    DOB: 10-Mar-1951    MRN: [id number]   Brief patient profile:  41 yowm quit smoking 2008 with baseline weight of 240 when he stopped smoking in March of 2008 p pna with doe only some better then  worse since summer 2015 on so referred 05/17/2015 to pulmonary clinic by Dr Friddie Macadam for copd eval as had been seen here in 2012 with GOLD II criteria.     History of Present Illness 05/17/2015 1st [facility] office visit/ Alfred Carney  / copd eval on ACEi  Chief Complaint  Patient presents with   Pulmonary Consult    Referre by Dr. Friddie Macadam. Pt c/o SOB x 10 yrs, gradually worse over the past yr.  He states that he gets SOB with minimal exertion such as taking a shower or walking 49 yrds on flat surface.   indolent onset progressive doe but always ok at rest/  Sleeps in recliner x 10 years Has osa/ could not tol cpap and tends to fall asleep p eats Assoc with sense of nasal and throat congestion but very little cough - does have hb but controls with prn ppi Had been on several different inhalers including according to the records Spiriva but didn't think any of them really helped and stopped them years ago rec Stop lisinopril   Valsartan  80 mg one daily in place of lisinopril  Prilosec Take 30- 60 min before your first and last meals of the day just until you return  GERD  Diet     05/21/2023  6 m  f/u ov/Alfred Carney re:  GOLD 2 but 02 dep  maint on 02 2lpm hs and daytime  prn  Chief Complaint  Patient presents with   Follow-up    SOB with exertion persistent.  Dyspnea:  limited by back and knees breathing may be a bit better but hard to tell dueo to ortho limitations  Cough: none  Sleeping: recliner 30 dgrees s  resp cc  SABA use: 2-3 x per week  Rec No change in my recommendations  Make sure you check your oxygen  saturation  AT  your highest level of activity (not after you stop)   to be sure it stays over 90%   06/15/2024  f/u ov/Alfred Carney re:  COPD GOLD 2 but 02 dep @02  2lpm hs/prn daytime    maint on aldactone   now  Chief Complaint  Patient presents with   Medical Management of Chronic Issues   COPD    Breathing has been stable overall.    Dyspnea:  walking at home on 2lpm but not checking sats  Cough: no longer rattling  Sleeping: recliner 20 degrees s resp cc  SABA use: none  02: 2lpm hs and all day    No obvious day to day or daytime variability or assoc excess/ purulent sputum or mucus plugs or hemoptysis or cp or chest tightness, subjective wheeze or overt sinus or hb symptoms.    Also denies any obvious fluctuation of symptoms with weather or environmental changes or other aggravating or alleviating factors except as outlined above   No unusual exposure hx or h/o childhood pna/ asthma or knowledge of premature birth.  Current Allergies, Complete Past Medical History, Past Surgical History, Family History, and Social History were reviewed in [employer] record.  ROS  The following are not active complaints unless bolded Hoarseness, sore throat, dysphagia, dental problems, itching, sneezing,  nasal congestion or discharge  of excess mucus or purulent secretions, ear ache,   fever, chills, sweats, unintended wt loss or wt gain, classically pleuritic or exertional cp,  orthopnea pnd or arm/hand swelling  or leg swelling improved  presyncope, palpitations, abdominal pain, anorexia, nausea, vomiting, diarrhea  or change in bowel habits or change in bladder habits, change in stools or change in urine, dysuria, hematuria,  rash, arthralgias, visual complaints, headache, numbness, weakness or ataxia or problems with walking or coordination,  change in mood or  memory.        Current Meds  Medication Sig   albuterol  (VENTOLIN  HFA) 108 (90 Base) MCG/ACT inhaler Inhale 2 puffs into the lungs every 4 (four) hours as needed for wheezing or shortness of breath.   allopurinol  (ZYLOPRIM ) 100 MG tablet Take 2 tablets  (200 mg total) by mouth daily.   aspirin  EC 81 MG tablet Take 1 tablet (81 mg total) by mouth daily. Swallow whole.   Cholecalciferol  (VITAMIN D  PO) Take 1,000 Units by mouth daily.   furosemide  (LASIX ) 20 MG tablet Take 2 tablets by mouth once daily   glipiZIDE  (GLUCOTROL ) 5 MG tablet TAKE 1 TABLET BY MOUTH TWICE DAILY BEFORE A MEAL   irbesartan  (AVAPRO ) 150 MG tablet Take 1 tablet (150 mg total) by mouth daily.   Lancet Device MISC 1 Lancet by Does not apply route daily as needed.   meloxicam  (MOBIC ) 7.5 MG tablet Take 1 tablet by mouth once daily   metoprolol  tartrate (LOPRESSOR ) 25 MG tablet Take 25 mg by mouth daily in the afternoon.   omeprazole  (PRILOSEC) 20 MG capsule Take 1 capsule (20 mg total) by mouth daily.   OXYGEN  Inhale 2 L into the lungs continuous.    pravastatin  (PRAVACHOL ) 40 MG tablet Take 1 tablet (40 mg total) by mouth daily.   spironolactone  (ALDACTONE ) 25 MG tablet Take 0.5 tablets (12.5 mg total) by mouth daily.                Objective:   Physical Exam  06/15/2024      291  05/21/2023        314  11/05/2022      328   08/11/2020    322  02/07/2020      329  08/03/2019   329  06/28/2015        326 > 08/15/2015  323 >  11/19/2017  326 >  01/05/2018 320 > 02/02/2018    324 >  05/06/2018  312 >  08/06/2018  317 > 11/13/2018   297  > 02/02/2019   318    05/17/15 329 lb (149.233 kg)  01/19/15 322 lb (146.058 kg)  01/18/15 319 lb (144.697 kg)   Vital signs reviewed  06/15/2024  - Note at rest 02 sats  96% on 2lpm    General appearance:    w/c bound /suspenders    HEENT : Oropharynx  clear   Nasal turbinates nl    NECK :  without  apparent JVD/ palpable Nodes/TM    LUNGS: no acc muscle use,  Min barrel  contour chest wall with bilateral  slightly decreased bs s audible wheeze and  without cough on insp or exp maneuvers and min  Hyperresonant  to  percussion bilaterally    CV:  RRR  no s3 or murmur or increase in P2, and no edema   ABD: still quite obese  soft  and nontender    MS:  Nl gait/ ext warm without deformities Or obvious  joint restrictions  calf tenderness, cyanosis or clubbing     SKIN: warm and dry without lesions    NEURO:  alert, approp, nl sensorium with  no motor or cerebellar deficits apparent.          Assessment & Plan:   Assessment & Plan COPD GOLD II  Quit smoking 2008  - 2012  PFT's FEV1 of 54% predicted with a ratio of 45%, consistent  with moderate to severe COPD with mild inspiratory truncation. - 05/17/2015  Walked RA  2 laps @ 185 ft each stopped due to  Sob/ desat at nl pace to 83%   - trial off acei 05/17/2015  - PFT's  06/28/2015  FEV1 1.67 (51 % ) ratio 55  p 13 % improvement from saba with DLCO  47 % corrects to 66 % for alv volume  - rec trial of symbicort  06/28/15 > coughing / breathing worse so stopped it   - 08/15/2015  Walked RA x 3 laps @ 185 ft each stopped due to End of study, nl pace, min sob/  desat  To 88%  - sats 81% at rest and not acutely ill 11/19/2017 > placed on 2lpm (see separate a/p)  - Spirometry 11/19/2017  FEV1 1.60 (49%)  Ratio 59 with classic curvature - 11/19/2017  After extensive coaching inhaler device  effectiveness =    90% with smi > trial of stiolto   PFT's  01/05/2018  FEV1 1.66 (52 % ) ratio 60  p 6 % improvement from saba p 6 prior to study with DLCO  38 % corrects to 56  % for alv volume   - 01/05/2018  Referred to rehab [facility] - Anoro trial 02/02/2018 > not better so d/c'd > no worse doe off  it   Airflow is not really his limiting problem at this point and not having tendency to aecopd so no change in rx needed  Chronic respiratory failure with hypoxia and hypercapnia (HCC) 11/19/2017  sats 81% at rest in a chronic stable state so rec 2lpm 24/7  - HC03  11/04/18 = 33  - 08/03/2019 POC trial could not maintain sats at > 88% on up to 5lpm POC  - HC03   06/22/20  = 31  -   08/11/2020   Walked 3lpm pulse   1.5  laps @ approx 280ft each @ moderate pace  stopped due to end of study sob  no desats on 02  -  HC03   05/15/22 = 29  - 11/05/2022   Walked on RA  x  one  lap(s) =  approx 150  ft  @ moderately slow pace, stopped due to sob/ desats to 88% up to 95% on 4lpm but declined to continue to walk due to fatigue  - HC03    07/20/23  = 32  - HCO3    06/15/2024  = 23   Adequate control on present rx, reviewed in detail with pt > no change in rx needed  / hypercarbic component has likely resolved.  Again advised to titrate 02 with exertion to make sure sats stay above 90%  Class 3 severe obesity due to excess calories with serious comorbidity and body mass index (BMI) of 45.0 to 49.9 in adult [facility]) Body mass index is 43.09 kg/m.  -  trending down/ encouraed  Lab Results  Component Value Date   TSH 2.92 05/10/2024    Contributing to doe and risk of GERD/dvt/ PE  >>>  reviewed the need and the process to achieve and maintain neg calorie balance > defer f/u primary care including intermittently monitoring thyroid  status        Each maintenance medication was reviewed in detail including emphasizing most importantly the difference between maintenance and prns and under what circumstances the prns are to be triggered using an action plan format where appropriate.  Total time for H and P, chart review, counseling, reviewing 02/pulse ox  device(s) and generating customized AVS unique to this office visit / same day charting = 23 min          AVS  Patient Instructions  Make sure you check your oxygen  saturation  AT  your highest level of activity (not after you stop)   to be sure it stays over 90% and adjust  02 flow upward to maintain this level if needed but remember to turn it back to previous settings when you stop (to conserve your supply).    Please schedule a follow up visit in 12  months but call sooner if needed    Ozell America, MD 06/16/2024

## 2024-06-15 ENCOUNTER — Other Ambulatory Visit (INDEPENDENT_AMBULATORY_CARE_PROVIDER_SITE_OTHER)

## 2024-06-15 ENCOUNTER — Ambulatory Visit: Admitting: Internal Medicine

## 2024-06-15 ENCOUNTER — Encounter: Payer: Self-pay | Admitting: Internal Medicine

## 2024-06-15 VITALS — BP 124/60 | HR 93 | Ht 69.0 in | Wt 291.8 lb

## 2024-06-15 DIAGNOSIS — I5032 Chronic diastolic (congestive) heart failure: Secondary | ICD-10-CM

## 2024-06-15 DIAGNOSIS — Z6841 Body Mass Index (BMI) 40.0 and over, adult: Secondary | ICD-10-CM

## 2024-06-15 DIAGNOSIS — J9611 Chronic respiratory failure with hypoxia: Secondary | ICD-10-CM

## 2024-06-15 DIAGNOSIS — J449 Chronic obstructive pulmonary disease, unspecified: Secondary | ICD-10-CM

## 2024-06-15 DIAGNOSIS — E66813 Obesity, class 3: Secondary | ICD-10-CM | POA: Diagnosis not present

## 2024-06-15 DIAGNOSIS — J9612 Chronic respiratory failure with hypercapnia: Secondary | ICD-10-CM

## 2024-06-15 NOTE — Patient Instructions (Addendum)
Make sure you check your oxygen saturation  AT  your highest level of activity (not after you stop)   to be sure it stays over 90% and adjust  02 flow upward to maintain this level if needed but remember to turn it back to previous settings when you stop (to conserve your supply).  ° ° °Please schedule a follow up visit in 12 months but call sooner if needed  °

## 2024-06-15 NOTE — Addendum Note (Signed)
 Addended by: HOPE VEVA PARAS on: 06/15/2024 09:27 AM   Modules accepted: Orders

## 2024-06-16 ENCOUNTER — Ambulatory Visit: Payer: Self-pay | Admitting: Family

## 2024-06-16 LAB — BASIC METABOLIC PANEL WITH GFR
BUN/Creatinine Ratio: 17 (ref 10–24)
BUN: 22 mg/dL (ref 8–27)
CO2: 23 mmol/L (ref 20–29)
Calcium: 10.1 mg/dL (ref 8.6–10.2)
Chloride: 97 mmol/L (ref 96–106)
Creatinine, Ser: 1.29 mg/dL — ABNORMAL HIGH (ref 0.76–1.27)
Glucose: 171 mg/dL — ABNORMAL HIGH (ref 70–99)
Potassium: 3.9 mmol/L (ref 3.5–5.2)
Sodium: 140 mmol/L (ref 134–144)
eGFR: 58 mL/min/1.73 — ABNORMAL LOW (ref >59)

## 2024-06-16 NOTE — Assessment & Plan Note (Addendum)
 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 - 05/17/2015  Walked RA  2 laps @ 185 ft each stopped due to  Sob/ desat at nl pace to 83%   - trial off acei 05/17/2015  - PFT's  06/28/2015  FEV1 1.67 (51 % ) ratio 55  p 13 % improvement from saba with DLCO  47 % corrects to 66 % for alv volume  - rec trial of symbicort  06/28/15 > coughing / breathing worse so stopped it   - 08/15/2015  Walked RA x 3 laps @ 185 ft each stopped due to End of study, nl pace, min sob/  desat  To 88%  - sats 81% at rest and not acutely ill 11/19/2017 > placed on 2lpm (see separate a/p)  - Spirometry 11/19/2017  FEV1 1.60 (49%)  Ratio 59 with classic curvature - 11/19/2017  After extensive coaching inhaler device  effectiveness =    90% with smi > trial of stiolto   PFT's  01/05/2018  FEV1 1.66 (52 % ) ratio 60  p 6 % improvement from saba p 6 prior to study with DLCO  38 % corrects to 56  % for alv volume   - 01/05/2018  Referred to rehab [facility] - Anoro trial 02/02/2018 > not better so d/c'd > no worse doe off  it   Airflow is not really his limiting problem at this point and not having tendency to aecopd so no change in rx needed

## 2024-06-16 NOTE — Assessment & Plan Note (Addendum)
 Body mass index is 43.09 kg/m.  -  trending down/ encouraed  Lab Results  Component Value Date   TSH 2.92 05/10/2024    Contributing to doe and risk of GERD/dvt/ PE  >>>   reviewed the need and the process to achieve and maintain neg calorie balance > defer f/u primary care including intermittently monitoring thyroid  status        Each maintenance medication was reviewed in detail including emphasizing most importantly the difference between maintenance and prns and under what circumstances the prns are to be triggered using an action plan format where appropriate.  Total time for H and P, chart review, counseling, reviewing 02/pulse ox  device(s) and generating customized AVS unique to this office visit / same day charting = 23 min

## 2024-06-16 NOTE — Assessment & Plan Note (Addendum)
 11/19/2017  sats 81% at rest in a chronic stable state so rec 2lpm 24/7  - HC03  11/04/18 = 33  - 08/03/2019 POC trial could not maintain sats at > 88% on up to 5lpm POC  - HC03   06/22/20  = 31  -   08/11/2020   Walked 3lpm pulse   1.5  laps @ approx 263ft each @ moderate pace  stopped due to end of study sob no desats on 02  -  HC03   05/15/22 = 29  - 11/05/2022   Walked on RA  x  one  lap(s) =  approx 150  ft  @ moderately slow pace, stopped due to sob/ desats to 88% up to 95% on 4lpm but declined to continue to walk due to fatigue  - HC03    07/20/23  = 32  - HCO3    06/15/2024  = 23   Adequate control on present rx, reviewed in detail with pt > no change in rx needed  / hypercarbic component has likely resolved.  Again advised to titrate 02 with exertion to make sure sats stay above 90%

## 2024-06-18 ENCOUNTER — Encounter: Payer: Self-pay | Admitting: *Deleted

## 2024-06-22 ENCOUNTER — Other Ambulatory Visit: Payer: Self-pay | Admitting: Family

## 2024-06-22 DIAGNOSIS — I5032 Chronic diastolic (congestive) heart failure: Secondary | ICD-10-CM

## 2024-06-23 ENCOUNTER — Telehealth: Payer: Self-pay | Admitting: Family

## 2024-06-23 DIAGNOSIS — N1831 Chronic kidney disease, stage 3a: Secondary | ICD-10-CM

## 2024-06-23 DIAGNOSIS — R944 Abnormal results of kidney function studies: Secondary | ICD-10-CM

## 2024-06-23 NOTE — Telephone Encounter (Signed)
 Hey Ashtyn   Did you happen to see the referral question Mr. Gregson had for you to change to another location? He has issues with transportation and needs a different location. He was messaging me to ask for f/u

## 2024-06-23 NOTE — Telephone Encounter (Signed)
 Please advise pt this question was not sent to me so I did not receive it, it was sent to our referral team.   I have sent this referral request on to our referral coordinator to ask for f/u on this

## 2024-06-23 NOTE — Telephone Encounter (Signed)
 Spoke with pt and let him know what was going on. He appreciated the call. He will await his new appointment information for [facility].

## 2024-06-23 NOTE — Telephone Encounter (Signed)
 Copied from CRM [id number]. Topic: Referral - Question >> Jun 23, 2024 10:34 AM Terri MATSU wrote: Reason for CRM: Patient is calling again to see if Dr.Dugal seen his message he sent last Friday about if she could send the referral to Dr Dennise at [facility] because it will be easier for him to travel. Callback number [phone]

## 2024-06-30 ENCOUNTER — Encounter: Payer: Self-pay | Admitting: Family

## 2024-06-30 ENCOUNTER — Ambulatory Visit: Admitting: Family

## 2024-06-30 VITALS — BP 118/64 | HR 81 | Temp 98.1°F | Ht 69.0 in | Wt 286.2 lb

## 2024-06-30 DIAGNOSIS — E7800 Pure hypercholesterolemia, unspecified: Secondary | ICD-10-CM | POA: Diagnosis not present

## 2024-06-30 DIAGNOSIS — E1122 Type 2 diabetes mellitus with diabetic chronic kidney disease: Secondary | ICD-10-CM | POA: Diagnosis not present

## 2024-06-30 DIAGNOSIS — D649 Anemia, unspecified: Secondary | ICD-10-CM

## 2024-06-30 DIAGNOSIS — R944 Abnormal results of kidney function studies: Secondary | ICD-10-CM

## 2024-06-30 DIAGNOSIS — R42 Dizziness and giddiness: Secondary | ICD-10-CM | POA: Diagnosis not present

## 2024-06-30 DIAGNOSIS — N182 Chronic kidney disease, stage 2 (mild): Secondary | ICD-10-CM

## 2024-06-30 DIAGNOSIS — E871 Hypo-osmolality and hyponatremia: Secondary | ICD-10-CM

## 2024-06-30 DIAGNOSIS — E559 Vitamin D deficiency, unspecified: Secondary | ICD-10-CM

## 2024-06-30 LAB — POCT URINALYSIS DIP (CLINITEK)
Bilirubin, UA: NEGATIVE
Blood, UA: NEGATIVE
Glucose, UA: NEGATIVE mg/dL
Ketones, POC UA: NEGATIVE mg/dL
Leukocytes, UA: NEGATIVE
Nitrite, UA: NEGATIVE
POC PROTEIN,UA: NEGATIVE
Spec Grav, UA: 1.01 (ref 1.010–1.025)
Urobilinogen, UA: 0.2 U/dL
pH, UA: 6 (ref 5.0–8.0)

## 2024-06-30 NOTE — Progress Notes (Signed)
 Established Patient Office Visit  Subjective:      CC:  Chief Complaint  Patient presents with   Follow-up    Here for 5 wk PNA f/u.    HPI: Alfred Carney is a 73 y.o. male presenting on 06/30/2024 for Follow-up (Here for 5 wk PNA f/u.) .  Discussed the use of AI scribe software for clinical note transcription with the patient, who gave verbal consent to proceed.  History of Present Illness Alfred Carney is a 73 year old male with diabetes who presents with dizziness and concerns about blood sugar management.  He experiences persistent dizziness, described as feeling 'weeby wobble' and unsteady on his feet. The dizziness is constant, with a particularly bad episode occurring yesterday. He does not skip meals but does not eat every two to three hours as recommended for diabetes management. He typically consumes a substantial meal around 4 or 5 PM and snacks on items like peanut butter crackers and fruit throughout the day.  He has a history of diabetes and was previously on glipizide , which he stopped taking. He has not started any new injectable diabetes medications and has not been checking his blood sugar levels regularly. His last recorded A1c was 7.9. No hypoglycemic episodes, but he acknowledges not eating frequently enough.  He has a history of kidney issues, with a noted decline in kidney function in September. He is scheduled to see a nephrologist next month. No increased frequency of urination or burning sensation, but he urinates more often in the morning after taking diuretics.  He has a history of pneumonia, with a previous CT scan showing a nodule in the left lower lobe and mild progressive emphysema. His pulmonologist is monitoring the nodule, and he recently had a follow-up where he was told he sounded better.  He mentions a past issue with anemia and was previously prescribed iron supplements. He has not started any new supplements recently but was considering some for  pulmonary health, which he has not yet taken.         Social history:  Relevant past medical, surgical, family and social history reviewed and updated as indicated. Interim medical history since our last visit reviewed.  Allergies and medications reviewed and updated.  DATA REVIEWED: CHART IN EPIC     ROS: Negative unless specifically indicated above in HPI.    Current Outpatient Medications:    albuterol  (VENTOLIN  HFA) 108 (90 Base) MCG/ACT inhaler, Inhale 2 puffs into the lungs every 4 (four) hours as needed for wheezing or shortness of breath., Disp: 8 g, Rfl: 2   allopurinol  (ZYLOPRIM ) 100 MG tablet, Take 2 tablets (200 mg total) by mouth daily., Disp: 180 tablet, Rfl: 3   aspirin  EC 81 MG tablet, Take 1 tablet (81 mg total) by mouth daily. Swallow whole., Disp: , Rfl:    Cholecalciferol  (VITAMIN D  PO), Take 1,000 Units by mouth daily., Disp: , Rfl:    furosemide  (LASIX ) 20 MG tablet, Take 2 tablets by mouth once daily, Disp: 60 tablet, Rfl: 0   glipiZIDE  (GLUCOTROL ) 5 MG tablet, TAKE 1 TABLET BY MOUTH TWICE DAILY BEFORE A MEAL, Disp: 180 tablet, Rfl: 3   glucose blood test strip, Use as instructed, Disp: 100 each, Rfl: 12   irbesartan  (AVAPRO ) 150 MG tablet, Take 1 tablet (150 mg total) by mouth daily., Disp: 90 tablet, Rfl: 0   Lancet Device MISC, 1 Lancet by Does not apply route daily as needed., Disp: 100 each, Rfl: 3   meloxicam  (  MOBIC ) 7.5 MG tablet, Take 1 tablet by mouth once daily, Disp: 90 tablet, Rfl: 3   metoprolol  tartrate (LOPRESSOR ) 25 MG tablet, Take 25 mg by mouth daily in the afternoon., Disp: , Rfl:    omeprazole  (PRILOSEC) 20 MG capsule, Take 1 capsule (20 mg total) by mouth daily., Disp: 30 capsule, Rfl: 3   OXYGEN , Inhale 2 L into the lungs continuous. , Disp: , Rfl:    pravastatin  (PRAVACHOL ) 40 MG tablet, Take 1 tablet (40 mg total) by mouth daily., Disp: 90 tablet, Rfl: 3   spironolactone  (ALDACTONE ) 25 MG tablet, Take 0.5 tablets (12.5 mg total) by  mouth daily., Disp: 45 tablet, Rfl: 3   tamsulosin  (FLOMAX ) 0.4 MG CAPS capsule, Take 1 capsule by mouth at bedtime, Disp: 90 capsule, Rfl: 1        Objective:        BP 118/64   Pulse 81   Temp 98.1 F (36.7 C) (Oral)   Ht 5' 9 (1.753 m)   Wt 286 lb 4 oz (129.8 kg)   SpO2 98% Comment: 2 L of O2  BMI 42.27 kg/m   Physical Exam VITALS: P- 81, BP- 118/64  Wt Readings from Last 3 Encounters:  06/30/24 286 lb 4 oz (129.8 kg)  06/15/24 291 lb 12.8 oz (132.4 kg)  06/04/24 291 lb 8 oz (132.2 kg)    Physical Exam Vitals reviewed.  Constitutional:      General: He is not in acute distress.    Appearance: Normal appearance. He is normal weight. He is not ill-appearing, toxic-appearing or diaphoretic.  Cardiovascular:     Rate and Rhythm: Normal rate and regular rhythm.  Pulmonary:     Effort: Pulmonary effort is normal. No respiratory distress.     Breath sounds: Normal breath sounds. No decreased air movement. No decreased breath sounds or wheezing.     Comments: On continuous o2 2L Musculoskeletal:        General: Normal range of motion.  Neurological:     General: No focal deficit present.     Mental Status: He is alert and oriented to person, place, and time. Mental status is at baseline.  Psychiatric:        Mood and Affect: Mood normal.        Behavior: Behavior normal.        Thought Content: Thought content normal.        Judgment: Judgment normal.      Wt Readings from Last 3 Encounters:  06/30/24 286 lb 4 oz (129.8 kg)  06/15/24 291 lb 12.8 oz (132.4 kg)  06/04/24 291 lb 8 oz (132.2 kg)        Results LABS BNP: elevated (05/11/2024) A1c: 7.9 D-dimer: negative  RADIOLOGY Chest X-ray: stable, no acute airspace disease, definite pneumonia (05/11/2024) Chest CT: pathology in the left lower lobe compatible with pneumonia, interval increase in size of previously demonstrated benign left lower lobe nodule, metabolically inactive, mild progressive  emphysema  DIAGNOSTIC Echocardiogram: up to date Ultrasound: negative for DVT  Assessment & Plan:   Assessment and Plan Assessment & Plan Chronic obstructive pulmonary disease with hypoxia and left lower lobe pulmonary nodule Chronic obstructive pulmonary disease with hypoxia. The left lower lobe pulmonary nodule shows interval increase in size on recent CT but remains metabolically inactive. Pulmonologist is satisfied with current status and has scheduled follow-up in one year. - No need to repeat chest x-ray as physical exam reassuring and pt with recent visit to pulmonary  -  Continue follow-up with pulmonologist as scheduled.  Chronic kidney disease stage 3 Chronic kidney disease stage 3 with recent dip in kidney function. - Check urine to rule out infection due to recent decreased gfr - Continue follow-up with nephrologist Dr. Dennise.  Type 2 diabetes mellitus Type 2 diabetes mellitus with recent discontinuation of glipizide . Last A1c was 7.9, potentially elevated due to stopping glipizide . Discussed potential initiation of Ozempic for weight loss and glycemic control. Ozempic is administered as a weekly injection in the abdominal fat and may help with weight loss. Insurance approval is required before initiation. - Check A1c to assess current glycemic control. - Consider initiation of Ozempic pending A1c results and insurance approval. -The beneficiary does not have any FDA labeled contraindications to the requested agent including pregnancy, lactation, h/o medullary thyroid  cancer or multiple endocrine neoplasia type II.  - Encourage eating small meals every 2-3 hours to stabilize blood sugar levels.  Dizziness and orthostatic symptoms Persistent dizziness and orthostatic symptoms, possibly related to inconsistent eating patterns. B12 deficiency is considered as a potential cause. - Encourage eating small meals every 2-3 hours to stabilize blood sugar levels. - Check B12 levels to  rule out deficiency as a cause of dizziness.  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  Anemia: recheck cbc today pending results      Return in about 6 months (around 12/29/2024) for f/u diabetes.     Ginger Patrick, MSN, APRN, FNP-C [facility]

## 2024-07-01 LAB — BASIC METABOLIC PANEL WITH GFR
BUN: 23 mg/dL (ref 6–23)
CO2: 27 meq/L (ref 19–32)
Calcium: 9.5 mg/dL (ref 8.4–10.5)
Chloride: 96 meq/L (ref 96–112)
Creatinine, Ser: 1.57 mg/dL — ABNORMAL HIGH (ref 0.40–1.50)
GFR: 43.14 mL/min — ABNORMAL LOW (ref >60.00)
Glucose, Bld: 189 mg/dL — ABNORMAL HIGH (ref 70–99)
Potassium: 4.3 meq/L (ref 3.5–5.1)
Sodium: 139 meq/L (ref 135–145)

## 2024-07-01 LAB — IBC + FERRITIN
Ferritin: 198.1 ng/mL (ref 22.0–322.0)
Iron: 34 ug/dL — ABNORMAL LOW (ref 42–165)
Saturation Ratios: 11.1 % — ABNORMAL LOW (ref 20.0–50.0)
TIBC: 305.2 ug/dL (ref 250.0–450.0)
Transferrin: 218 mg/dL (ref 212.0–360.0)

## 2024-07-01 LAB — CBC
HCT: 39 % (ref 39.0–52.0)
Hemoglobin: 12.6 g/dL — ABNORMAL LOW (ref 13.0–17.0)
MCHC: 32.2 g/dL (ref 30.0–36.0)
MCV: 88.5 fl (ref 78.0–100.0)
Platelets: 371 K/uL (ref 150.0–400.0)
RBC: 4.41 Mil/uL (ref 4.22–5.81)
RDW: 14.9 % (ref 11.5–15.5)
WBC: 15.1 K/uL — ABNORMAL HIGH (ref 4.0–10.5)

## 2024-07-01 LAB — B12 AND FOLATE PANEL
Folate: 8.4 ng/mL (ref >5.9)
Vitamin B-12: 195 pg/mL — ABNORMAL LOW (ref 211–911)

## 2024-07-01 LAB — HEMOGLOBIN A1C: Hgb A1c MFr Bld: 7.4 % — ABNORMAL HIGH (ref 4.6–6.5)

## 2024-07-01 NOTE — Telephone Encounter (Signed)
 Copied from CRM #[id number]. Topic: Referral - Question >> Jul 01, 2024  1:32 PM Carlyon D wrote: Reason for CRM: Pt wife Alfred Carney is calliing in regards to pt referral for kidney Dr. Almeta wants to see Dr. Dennise at [facility] in [place] Please send new referral to that provider. Please reach to pt wife Mrs. Alfred Carney when there is a status update.

## 2024-07-05 NOTE — Telephone Encounter (Signed)
 Spoke with pt and advised him that a new referral has been placed for Dr. Dennise at [facility]. He verbalized understanding.

## 2024-07-05 NOTE — Addendum Note (Signed)
 Addended by: ALBINO SHAVER C on: 07/05/2024 02:31 PM   Modules accepted: Orders

## 2024-07-06 ENCOUNTER — Ambulatory Visit: Payer: Self-pay | Admitting: Family

## 2024-07-06 DIAGNOSIS — D72829 Elevated white blood cell count, unspecified: Secondary | ICD-10-CM

## 2024-07-06 NOTE — Progress Notes (Signed)
 White blood ells are a bit elevated Are you showing any signs of infection?  Urinary symptoms?  Fever?  Anemia thankfully is improving a slight bit Kidney function is stable   A1c is a bit higher than goal but with some improvement from three months ago  Are you taking over hte counter iron?   Vitamin B12 very low, please set up for B12 injections, 1000 mcg IM once monthly for three months. I also recommend taking daily B12 1000 mcg over the counter.   Your b12 is very low, this could contribute to fatigue , brain fog and sometimes nerve pains or numbness in the extremities.  Let's get you set up with B12 injections, once a month for three months.  Call our office to set these up as nurse visits.  At the same time I suggest starting over the counter B12 1000 mcg once daily.   Urine was thankfully negative for infection.   For your first b12 shot can you also have a lab only so we can repeat your white blood cells?

## 2024-07-06 NOTE — Telephone Encounter (Signed)
 Referral rerouted to Nephrology in [facility] referral closed.

## 2024-07-08 ENCOUNTER — Ambulatory Visit (INDEPENDENT_AMBULATORY_CARE_PROVIDER_SITE_OTHER)

## 2024-07-08 DIAGNOSIS — E538 Deficiency of other specified B group vitamins: Secondary | ICD-10-CM | POA: Diagnosis not present

## 2024-07-08 MED ORDER — CYANOCOBALAMIN 1000 MCG/ML IJ SOLN
1000.0000 ug | Freq: Once | INTRAMUSCULAR | Status: AC
  Administered 2024-07-08: 1000 ug via INTRAMUSCULAR

## 2024-07-08 NOTE — Progress Notes (Signed)
 Per orders of Ginger Patrick, NP, injection of vitamin b 12 given by Laray Arenas in right deltoid. Patient tolerated injection well. Patient will make appointment for 1 month. This was pts first B 12 inj and pt waited 15 mins without complaint or problem.

## 2024-07-09 NOTE — Telephone Encounter (Signed)
 Referral already faxed to Nephrology in [place].   See referral notes

## 2024-07-14 NOTE — Telephone Encounter (Signed)
 Pt is scheduled  [facility] 07/22/24 at 11am Dr Dennise

## 2024-07-17 ENCOUNTER — Other Ambulatory Visit: Payer: Self-pay | Admitting: Family

## 2024-07-17 DIAGNOSIS — E785 Hyperlipidemia, unspecified: Secondary | ICD-10-CM

## 2024-07-20 ENCOUNTER — Other Ambulatory Visit: Payer: Self-pay | Admitting: Family

## 2024-07-20 DIAGNOSIS — I5032 Chronic diastolic (congestive) heart failure: Secondary | ICD-10-CM

## 2024-07-27 ENCOUNTER — Encounter: Payer: Self-pay | Admitting: Family

## 2024-07-29 ENCOUNTER — Telehealth: Payer: Self-pay | Admitting: Family

## 2024-07-29 NOTE — Telephone Encounter (Signed)
 Called to confirm/remind patient of their appointment at the [facility] on 07/30/24.   Appointment:   [x] Confirmed  [] Left mess   [] No answer/No voice mail  [] VM Full/unable to leave message  [] Phone not in service  Patient reminded to bring all medications and/or complete list.  Confirmed patient has transportation. Gave directions, instructed to utilize valet parking.

## 2024-07-29 NOTE — Progress Notes (Unsigned)
 [facility] Note   Referring Physician: admission 06/25 PCP: Corwin Antu, FNP Cardiologist: Peter Jordan, MD   Chief Complaint: shortness of breath   HPI:  Alfred Carney is a 73 y/o male with a history of non-insulin -dependent diabetes mellitus, PAD, hyperlipidemia, hypertension, history of gout, BPH, OSA (non compliant w/ CPAP), COPD on chronic 2 L nasal cannula at baseline, lumbar spine DJD with stenosis, venous stasis, CKD, AF/ RVR (06/25), paroxsymal atrial tachycardia and HFpEF.   Pertinent cardiac history: Normal Lexiscan  Myoview  in 09/2011. Echo in 10/2018 noted LVEF of 60-65%. Echo in 05/2020 with LVEF unchanged and grade I diastolic dysfunction.  Admitted 03/11/24 with weakness over ~2 weeks and recent fall. On admission, BNP was 116.1, HS-troponin was 16, lactic acid 1.2, uric acid 5.5, and TSH was 1.339. Chest x-ray noted emphysema. Initial EKG showed atrial fibrillation however rhythm strip evaluated by cardiology showed PACs with sinus tachycardia.  [facility] cardiology input noted, favored secondary to untreated sleep apnea/obesity hypoventilation syndrome. Echo 03/12/24: LVEF of 55-60%, and grade I diastolic dysfunction. Started on cardizem  and given IV lasix . Abnormal UA and with weakness so treated with three doses of Rocephin . Urine culture negative.   Was in the ED 05/11/24 with intermittent shortness of breath. PCP ordered D-dimer that returned elevated. Legs are swollen. BNP mildly elevated. Ultrasound negative for DVT. CT negative for PE with small suspected area of pneumonia. Oral antibiotics given.   He presents today, with his wife, for a HF follow-up visit with a chief complaint of shortness of breath. Has associated fatigue, dizziness, off balance, pedal edema (improving). He's noticed tremors/ weakness in legs & feels like he may have Parkinson's. He's also having intermittent pain in RLQ and radiates around his side towards his back. Denies any urinary  symptoms and feels like it could be his appendix. Has had some recent falls but doesn't feel like he's injured himself. He is elevating legs during the day. Wearing oxygen  at 2L around the clock.   ROS: All systems negative except what is listed in HPI, PMH and Problem List   Past Medical History:  Diagnosis Date   Chronic obstructive pulmonary disease (HCC) 2008   Moderate   Colon polyps    Community acquired pneumonia    Coronary artery disease    Degenerative joint disease    Diabetes mellitus without complication (HCC)    type 2   Emphysema lung (HCC)    Enlarged liver    fatty liver by '09 CT   Gastroesophageal reflux disease    Gout    H/O hiatal hernia    History of Rocky [place] spotted fever    Possible history of [place] Spotted Fever   Hyperlipidemia    Hypertension    Hypokalemia    diuretic induced, resolved   Microcytic anemia    iron pills   Morbid obesity (HCC)    Shortness of breath    Skin cancer    skin cancer lip removed   Sleep apnea    not currently using CPAP 05/20/13   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 8 g 2   allopurinol  (ZYLOPRIM ) 100 MG tablet Take 2 tablets (200 mg total) by mouth daily. 180 tablet 3   aspirin   EC 81 MG tablet Take 1 tablet (81 mg total) by mouth daily. Swallow whole.     Cholecalciferol  (VITAMIN D  PO) Take 1,000 Units by mouth daily.     furosemide  (LASIX ) 20 MG tablet Take 2 tablets by mouth once daily 60 tablet 0   glipiZIDE  (GLUCOTROL ) 5 MG tablet TAKE 1 TABLET BY MOUTH TWICE DAILY BEFORE A MEAL 180 tablet 3   glucose blood test strip Use as instructed 100 each 12   irbesartan  (AVAPRO ) 150 MG tablet Take 1 tablet (150 mg total) by mouth daily. 90 tablet 0   Lancet  Device MISC 1 Lancet by Does not apply route daily as needed. 100 each 3   meloxicam  (MOBIC ) 7.5 MG tablet Take 1 tablet by mouth once daily 90 tablet 3   metoprolol  tartrate (LOPRESSOR ) 25 MG tablet Take 25 mg by mouth daily in the afternoon.     omeprazole  (PRILOSEC) 20 MG capsule Take 1 capsule (20 mg total) by mouth daily. 30 capsule 3   OXYGEN  Inhale 2 L into the lungs continuous.      pravastatin  (PRAVACHOL ) 40 MG tablet Take 1 tablet by mouth once daily 90 tablet 0   spironolactone  (ALDACTONE ) 25 MG tablet Take 0.5 tablets (12.5 mg total) by mouth daily. 45 tablet 3   tamsulosin  (FLOMAX ) 0.4 MG CAPS capsule Take 1 capsule by mouth at bedtime 90 capsule 1   No current facility-administered medications for this visit.    Allergies  Allergen Reactions   Jardiance  [Empagliflozin ] Other (See Comments)    Stomach upset      Social History   Socioeconomic History   Marital status: Married    Spouse name: Alfred Carney   Number of children: 4   Years of education: GED, ITT   Highest education level: Associate degree: occupational, scientist, product/process development, or vocational program  Occupational History   Occupation: architectural drawing-retired  Tobacco Use   Smoking status: Former    Current packs/day: 0.00    Average packs/day: 1 pack/day for 35.0 years (35.0 ttl pk-yrs)    Types: Cigarettes    Start date: 09/17/1971    Quit date: 09/16/2006    Years since quitting: 17.8   Smokeless tobacco: Never  Vaping Use   Vaping status: Never Used  Substance and Sexual Activity   Alcohol use: No    Alcohol/week: 0.0 standard drinks of alcohol   Drug use: No   Sexual activity: Not Currently  Other Topics Concern   Not on file  Social History Narrative   Lives with wife   Right Handed   Drinks 4-5 cups caffeine daily   Social Drivers of Health   Financial Resource Strain: Low Risk  (05/19/2024)   Overall Financial Resource Strain (CARDIA)    Difficulty of Paying Living Expenses: Not hard at all  Food  Insecurity: No Food Insecurity (05/19/2024)   Hunger Vital Sign    Worried About Running Out of Food in the Last Year: Never true    Ran Out of Food in the Last Year: Never true  Transportation Needs: No Transportation Needs (05/19/2024)   PRAPARE - Administrator, Civil Service (Medical): No    Lack of Transportation (Non-Medical): No  Physical Activity: Inactive (05/19/2024)   Exercise Vital Sign    Days of Exercise per Week: 0 days    Minutes of Exercise per Session: 0 min  Stress: No Stress Concern Present (05/19/2024)   [employer] of Occupational Health - Occupational Stress Questionnaire  Feeling of Stress: Not at all  Social Connections: Moderately Integrated (05/19/2024)   Social Connection and Isolation Panel    Frequency of Communication with Friends and Family: More than three times a week    Frequency of Social Gatherings with Friends and Family: Twice a week    Attends Religious Services: Never    Database Administrator or Organizations: Yes    Attends Banker Meetings: Never    Marital Status: Married  Catering Manager Violence: Not At Risk (05/19/2024)   Humiliation, Afraid, Rape, and Kick questionnaire    Fear of Current or Ex-Partner: No    Emotionally Abused: No    Physically Abused: No    Sexually Abused: No      Family History  Problem Relation Age of Onset   Osteoarthritis Mother    Diabetes Mother    Pulmonary embolism Mother    Heart disease Father        Coronary Artery Disease   Stroke Father    Multiple sclerosis Sister    Factor V Leiden deficiency Sister    Emphysema Sister    Hyperlipidemia Brother    Heart attack Maternal Grandmother    Lung cancer Maternal Grandfather        smoked   Vitals:   07/30/24 1125 07/30/24 1143  BP: 115/73   Pulse: (!) 107 75  SpO2: 95%   Weight: 279 lb 6.4 oz (126.7 kg)   Height: 5' 9 (1.753 m)    Wt Readings from Last 3 Encounters:  07/30/24 279 lb 6.4 oz (126.7 kg)  06/30/24  286 lb 4 oz (129.8 kg)  06/15/24 291 lb 12.8 oz (132.4 kg)    Lab Results  Component Value Date   CREATININE 1.57 (H) 06/30/2024   CREATININE 1.29 (H) 06/15/2024   CREATININE 1.10 06/04/2024   PHYSICAL EXAM:  General: Well appearing male in wheelchair  Cor: No JVD. Regular rhythm, rate.  Lungs: clear. Wearing oxygen  at 2L Abdomen: soft, nondistended.Unable to illicit any pain when palpating Extremities: trace pitting edema bilateral lower legs Neuro:. Affect pleasant   ECG: not done   ASSESSMENT & PLAN:  1: NICM with preserved ejection fraction- - suspect due to untreated OSA/ PAT - NYHA Carney III - euvolemic - weight down 12 pounds from last visit here 2 months ago - Echo 03/12/24: LVEF of 55-60%, and grade I diastolic dysfunction. - continue furosemide  40mg  daily - continue irbesartan  150mg  daily - continue spironolactone  12.5mg  daily.  - developed stomach upset with jardiance  - BNP 05/11/24 was 126.1  2: HTN- - BP 115/73 - saw PCP Alfred Carney) 10/25 - saw nephrology Alfred Carney) 11/25 - BMET 07/22/24 reviewed: sodium 139, potassium 3.7, creatinine 1.5 & GFR 34  3: DM- - A1c 06/30/24 was 7.4% - managed by PCP  4: Hyperlipidemia-  - LDL 11/18/23 was 67 - continue pravastatin  40mg  daily  5: AF/ PAT- - continue metoprolol  tartrate 25mg  - patient says he will get in touch with Dr Gib office for f/u. Advised that if he decides to transfer to local cardiology care, a referral could be made to [facility] downstairs  6: OSA- - noncompliant with CPAP as he says that he sleeps better without wearing it  7: COPD- - sees pulmonology Alfred Carney) 09/25 - wearing oxygen  at 2L around the clock.    Sent PCP a message regarding his reported tremors/ weakness in his legs as well as his c/o pain in RLQ/ groin area that radiates around the side.  Due to HF stability, will not make a return appointment at this time. Advised to get an appointment with Dr. Gib office for further care.  Patient and wife are agreeable to this. Can return for questions or to make another appointment if ever needed.   I spent 39 minutes reviewing records, interviewing/ examing patient and managing plan/ orders.   Alfred DELENA Class, FNP 07/29/24

## 2024-07-30 ENCOUNTER — Telehealth: Payer: Self-pay | Admitting: Family

## 2024-07-30 ENCOUNTER — Ambulatory Visit: Attending: Family | Admitting: Family

## 2024-07-30 ENCOUNTER — Encounter: Payer: Self-pay | Admitting: Family

## 2024-07-30 VITALS — BP 115/73 | HR 75 | Ht 69.0 in | Wt 279.4 lb

## 2024-07-30 DIAGNOSIS — E1169 Type 2 diabetes mellitus with other specified complication: Secondary | ICD-10-CM

## 2024-07-30 DIAGNOSIS — E785 Hyperlipidemia, unspecified: Secondary | ICD-10-CM | POA: Diagnosis present

## 2024-07-30 DIAGNOSIS — I480 Paroxysmal atrial fibrillation: Secondary | ICD-10-CM | POA: Diagnosis not present

## 2024-07-30 DIAGNOSIS — N400 Enlarged prostate without lower urinary tract symptoms: Secondary | ICD-10-CM | POA: Insufficient documentation

## 2024-07-30 DIAGNOSIS — M47816 Spondylosis without myelopathy or radiculopathy, lumbar region: Secondary | ICD-10-CM | POA: Diagnosis not present

## 2024-07-30 DIAGNOSIS — E1151 Type 2 diabetes mellitus with diabetic peripheral angiopathy without gangrene: Secondary | ICD-10-CM | POA: Diagnosis present

## 2024-07-30 DIAGNOSIS — I5032 Chronic diastolic (congestive) heart failure: Secondary | ICD-10-CM | POA: Diagnosis not present

## 2024-07-30 DIAGNOSIS — N189 Chronic kidney disease, unspecified: Secondary | ICD-10-CM | POA: Diagnosis not present

## 2024-07-30 DIAGNOSIS — I130 Hypertensive heart and chronic kidney disease with heart failure and stage 1 through stage 4 chronic kidney disease, or unspecified chronic kidney disease: Secondary | ICD-10-CM | POA: Diagnosis not present

## 2024-07-30 DIAGNOSIS — G4733 Obstructive sleep apnea (adult) (pediatric): Secondary | ICD-10-CM | POA: Diagnosis not present

## 2024-07-30 DIAGNOSIS — M109 Gout, unspecified: Secondary | ICD-10-CM | POA: Insufficient documentation

## 2024-07-30 DIAGNOSIS — I872 Venous insufficiency (chronic) (peripheral): Secondary | ICD-10-CM | POA: Diagnosis not present

## 2024-07-30 DIAGNOSIS — N1831 Chronic kidney disease, stage 3a: Secondary | ICD-10-CM

## 2024-07-30 DIAGNOSIS — E1122 Type 2 diabetes mellitus with diabetic chronic kidney disease: Secondary | ICD-10-CM

## 2024-07-30 DIAGNOSIS — M48061 Spinal stenosis, lumbar region without neurogenic claudication: Secondary | ICD-10-CM | POA: Diagnosis not present

## 2024-07-30 DIAGNOSIS — I4719 Other supraventricular tachycardia: Secondary | ICD-10-CM

## 2024-07-30 DIAGNOSIS — J449 Chronic obstructive pulmonary disease, unspecified: Secondary | ICD-10-CM | POA: Insufficient documentation

## 2024-07-30 DIAGNOSIS — Z9981 Dependence on supplemental oxygen: Secondary | ICD-10-CM | POA: Insufficient documentation

## 2024-07-30 DIAGNOSIS — Z794 Long term (current) use of insulin: Secondary | ICD-10-CM

## 2024-07-30 DIAGNOSIS — I10 Essential (primary) hypertension: Secondary | ICD-10-CM

## 2024-07-30 NOTE — Telephone Encounter (Signed)
 Spoke with pt and below are his answers to your questions:  Is he having any bowel changes? At times, constipation to diarrhea and vice versa Any fever/chills? No Any issues with urinary retention to suggest kidney stone? No Stabbing back pain out of the ordinary? No   Is the pain mainly exacerbated with movement? Yes Tremors, has he always had these or newer in onset? Both hands? I have always had tremors. They are in both hands and legs.

## 2024-07-30 NOTE — Telephone Encounter (Signed)
 Pt went to HF clinic today and the NP reached out to me. He was c/o of some right lower groin pain spreading to lower back.  Can you ask him Is he having any bowel changes? Any fever/chills? Any issues with urinary retention to suggest kidney stone?  Stabbing back pain out of the ordinary?  Is the pain mainly exacerbated with movement?  Tremors, has he always had these or newer in onset? Both hands?

## 2024-07-30 NOTE — Telephone Encounter (Signed)
 Just read the note from Alfred Carney in addition to his responses ask the following:   I saw he might also suspect appendicitis.  Given it's the weakness if the pain is significant or worsening he needs more urgent evaluation. If it can wait the weekend I'd like him to get in with one of his asap at the beginning of the weak. If he starts with fever, chills, vomiting, worsening abd pain go to Er.   Could also be muscular or constipation however without an exam it's hard to say.   Also will be helpful to evaluate his tremors in office and will likely send a referral to neurology for general evaluation as well.

## 2024-07-30 NOTE — Patient Instructions (Signed)
 PLEASE FOLLOW CLOSELY WITH DR. GIB OFFICE THE NUMBER TO THEIR OFFICE [phone]  Follow-Up in: AS NEEDED WITH US     Thank you for choosing [facility].    At the [facility], you and your health needs are our priority. We have a designated team specialized in the treatment of Heart Failure. This Care Team includes your primary Heart Failure Specialized Cardiologist (physician), Advanced Practice Providers (APPs- Physician Assistants and Nurse Practitioners), and Pharmacist who all work together to provide you with the care you need, when you need it.   You may see any of the following providers on your designated Care Team at your next follow up:  Dr. Toribio Fuel Dr. Ezra Shuck Dr. Ria Commander Dr. Morene Brownie Ellouise Class, FNP Jaun Bash, RPH-CPP  Please be sure to bring in all your medications bottles to every appointment.   Need to Contact Us :  If you have any questions or concerns before your next appointment please send us  a message through [facility] or call our office at [phone].    TO LEAVE A MESSAGE FOR THE NURSE SELECT OPTION 2, PLEASE LEAVE A MESSAGE INCLUDING: YOUR NAME DATE OF BIRTH CALL BACK NUMBER REASON FOR CALL**this is important as we prioritize the call backs  YOU WILL RECEIVE A CALL BACK THE SAME DAY AS LONG AS YOU CALL BEFORE 4:00 PM

## 2024-07-30 NOTE — Telephone Encounter (Signed)
 LM for pt to return call.

## 2024-07-30 NOTE — Telephone Encounter (Signed)
 Spoke with pt and he is aware of Tabtiha's response. Pt has been scheduled to see Tabitha on 08/02/24 at 1120. He is aware that if he starts having fever, chills, vomiting, worsening abdominal pain he will got to the ED.

## 2024-08-01 ENCOUNTER — Other Ambulatory Visit: Payer: Self-pay | Admitting: Family

## 2024-08-01 DIAGNOSIS — N400 Enlarged prostate without lower urinary tract symptoms: Secondary | ICD-10-CM

## 2024-08-02 ENCOUNTER — Ambulatory Visit

## 2024-08-02 ENCOUNTER — Ambulatory Visit: Payer: Self-pay | Admitting: Family

## 2024-08-02 ENCOUNTER — Telehealth: Payer: Self-pay | Admitting: Family

## 2024-08-02 ENCOUNTER — Encounter: Payer: Self-pay | Admitting: Family

## 2024-08-02 ENCOUNTER — Ambulatory Visit: Admitting: Family

## 2024-08-02 ENCOUNTER — Other Ambulatory Visit: Payer: Self-pay | Admitting: Family

## 2024-08-02 VITALS — BP 126/88 | HR 70 | Temp 98.5°F | Ht 69.0 in | Wt 275.2 lb

## 2024-08-02 DIAGNOSIS — E876 Hypokalemia: Secondary | ICD-10-CM

## 2024-08-02 DIAGNOSIS — R251 Tremor, unspecified: Secondary | ICD-10-CM

## 2024-08-02 DIAGNOSIS — R2681 Unsteadiness on feet: Secondary | ICD-10-CM

## 2024-08-02 DIAGNOSIS — R29898 Other symptoms and signs involving the musculoskeletal system: Secondary | ICD-10-CM | POA: Diagnosis not present

## 2024-08-02 DIAGNOSIS — E538 Deficiency of other specified B group vitamins: Secondary | ICD-10-CM

## 2024-08-02 DIAGNOSIS — E1159 Type 2 diabetes mellitus with other circulatory complications: Secondary | ICD-10-CM | POA: Diagnosis not present

## 2024-08-02 DIAGNOSIS — R1012 Left upper quadrant pain: Secondary | ICD-10-CM | POA: Diagnosis not present

## 2024-08-02 DIAGNOSIS — I2510 Atherosclerotic heart disease of native coronary artery without angina pectoris: Secondary | ICD-10-CM

## 2024-08-02 DIAGNOSIS — J9611 Chronic respiratory failure with hypoxia: Secondary | ICD-10-CM | POA: Diagnosis not present

## 2024-08-02 DIAGNOSIS — I152 Hypertension secondary to endocrine disorders: Secondary | ICD-10-CM

## 2024-08-02 DIAGNOSIS — R1085 Abdominal pain of multiple sites: Secondary | ICD-10-CM

## 2024-08-02 DIAGNOSIS — I4719 Other supraventricular tachycardia: Secondary | ICD-10-CM | POA: Diagnosis not present

## 2024-08-02 DIAGNOSIS — R110 Nausea: Secondary | ICD-10-CM

## 2024-08-02 DIAGNOSIS — R0609 Other forms of dyspnea: Secondary | ICD-10-CM

## 2024-08-02 DIAGNOSIS — R5381 Other malaise: Secondary | ICD-10-CM

## 2024-08-02 DIAGNOSIS — R1031 Right lower quadrant pain: Secondary | ICD-10-CM

## 2024-08-02 DIAGNOSIS — N400 Enlarged prostate without lower urinary tract symptoms: Secondary | ICD-10-CM

## 2024-08-02 DIAGNOSIS — K5900 Constipation, unspecified: Secondary | ICD-10-CM

## 2024-08-02 LAB — POCT URINALYSIS DIP (CLINITEK)
Bilirubin, UA: NEGATIVE
Blood, UA: NEGATIVE
Glucose, UA: NEGATIVE mg/dL
Ketones, POC UA: NEGATIVE mg/dL
Leukocytes, UA: NEGATIVE
Nitrite, UA: NEGATIVE
POC PROTEIN,UA: NEGATIVE
Spec Grav, UA: 1.01 (ref 1.010–1.025)
Urobilinogen, UA: 0.2 U/dL
pH, UA: 6 (ref 5.0–8.0)

## 2024-08-02 LAB — CBC WITH DIFFERENTIAL/PLATELET
Basophils Absolute: 0.1 K/uL (ref 0.0–0.1)
Basophils Relative: 0.5 % (ref 0.0–3.0)
Eosinophils Absolute: 0.1 K/uL (ref 0.0–0.7)
Eosinophils Relative: 0.5 % (ref 0.0–5.0)
HCT: 39.7 % (ref 39.0–52.0)
Hemoglobin: 13.1 g/dL (ref 13.0–17.0)
Lymphocytes Relative: 15.7 % (ref 12.0–46.0)
Lymphs Abs: 2.3 K/uL (ref 0.7–4.0)
MCHC: 33 g/dL (ref 30.0–36.0)
MCV: 85.6 fl (ref 78.0–100.0)
Monocytes Absolute: 1.1 K/uL — ABNORMAL HIGH (ref 0.1–1.0)
Monocytes Relative: 7.8 % (ref 3.0–12.0)
Neutro Abs: 10.8 K/uL — ABNORMAL HIGH (ref 1.4–7.7)
Neutrophils Relative %: 75.5 % (ref 43.0–77.0)
Platelets: 370 K/uL (ref 150.0–400.0)
RBC: 4.65 Mil/uL (ref 4.22–5.81)
RDW: 14.7 % (ref 11.5–15.5)
WBC: 14.3 K/uL — ABNORMAL HIGH (ref 4.0–10.5)

## 2024-08-02 LAB — COMPREHENSIVE METABOLIC PANEL WITH GFR
ALT: 10 U/L (ref 0–53)
AST: 10 U/L (ref 0–37)
Albumin: 3.7 g/dL (ref 3.5–5.2)
Alkaline Phosphatase: 68 U/L (ref 39–117)
BUN: 18 mg/dL (ref 6–23)
CO2: 31 meq/L (ref 19–32)
Calcium: 9.4 mg/dL (ref 8.4–10.5)
Chloride: 95 meq/L — ABNORMAL LOW (ref 96–112)
Creatinine, Ser: 1.47 mg/dL (ref 0.40–1.50)
GFR: 46.66 mL/min — ABNORMAL LOW (ref >60.00)
Glucose, Bld: 170 mg/dL — ABNORMAL HIGH (ref 70–99)
Potassium: 3.1 meq/L — ABNORMAL LOW (ref 3.5–5.1)
Sodium: 141 meq/L (ref 135–145)
Total Bilirubin: 0.9 mg/dL (ref 0.2–1.2)
Total Protein: 7 g/dL (ref 6.0–8.3)

## 2024-08-02 LAB — AMYLASE: Amylase: 35 U/L (ref 27–131)

## 2024-08-02 LAB — LIPASE: Lipase: 45 U/L (ref 11.0–59.0)

## 2024-08-02 MED ORDER — IRBESARTAN 150 MG PO TABS: 150.0000 mg | ORAL_TABLET | Freq: Every day | ORAL | 0 refills | Status: DC

## 2024-08-02 MED ORDER — TAMSULOSIN HCL 0.4 MG PO CAPS: 0.4000 mg | ORAL_CAPSULE | Freq: Every day | ORAL | 0 refills | Status: AC

## 2024-08-02 MED ORDER — METOPROLOL TARTRATE 25 MG PO TABS: 25.0000 mg | ORAL_TABLET | Freq: Every day | ORAL | 0 refills | Status: AC

## 2024-08-02 MED ORDER — CYANOCOBALAMIN 1000 MCG/ML IJ SOLN
1000.0000 ug | Freq: Once | INTRAMUSCULAR | Status: AC
  Administered 2024-08-02: 1000 ug via INTRAMUSCULAR

## 2024-08-02 NOTE — Progress Notes (Addendum)
 Established Patient Office Visit  Subjective:      CC:  Chief Complaint  Patient presents with   Follow-up    Still having issues with lower right abdominal pain, pain is better than it was when he called in last week.    HPI: Alfred Carney is a 73 y.o. male presenting on 08/02/2024 for Follow-up (Still having issues with lower right abdominal pain, pain is better than it was when he called in last week.) .  Discussed the use of AI scribe software for clinical note transcription with the patient, who gave verbal consent to proceed.  History of Present Illness Alfred Carney is a 73 year old male who presents with right lower abdominal pain.  He has been experiencing right lower abdominal pain for about a week, with significant pain noted yesterday that has since subsided somewhat. The pain is localized to the groin area. Movement does not exacerbate the pain.  He experiences nausea, which he associates with the initiation of oral B12 supplements. The nausea has increased in frequency since starting the B12, although he has not vomited.  He has constipation, with bowel movements occurring every day or two, which is normal for him. He has not taken any laxatives but has used Alka-Seltzer, which provided some relief. No blood in stool.  He experiences dizziness, particularly when reaching for items above his head, causing his legs to feel weak and shaky. He has stopped taking glipizide , suspecting it might have contributed to his dizziness, but the dizziness persists. He does not regularly check his blood sugar levels.  He has urinary frequency, needing to urinate every hour. He had previously stopped taking tamsulosin  but has resumed it due to urinary issues.  He is on several medications, including furosemide , pravastatin , and irbesartan , and is unsure about the necessity of some of them. He has recently started seeing a kidney doctor and is under the care of a cardiologist for his  heart condition.  He has a history of balance issues and weakness, which have been exacerbated by recent hospitalizations. He has undergone physical therapy in the past but feels it was insufficient. He experiences hand tremors, primarily in the right hand, and has concerns about potential Parkinson's disease.  No upper abdominal pain, increased burping, or heartburn. He has a history of aneurysm surgery, which has left a scar.         Social history:  Relevant past medical, surgical, family and social history reviewed and updated as indicated. Interim medical history since our last visit reviewed.  Allergies and medications reviewed and updated.  DATA REVIEWED: CHART IN EPIC     ROS: Negative unless specifically indicated above in HPI.    Current Outpatient Medications:    albuterol  (VENTOLIN  HFA) 108 (90 Base) MCG/ACT inhaler, Inhale 2 puffs into the lungs every 4 (four) hours as needed for wheezing or shortness of breath., Disp: 8 g, Rfl: 2   allopurinol  (ZYLOPRIM ) 100 MG tablet, Take 2 tablets (200 mg total) by mouth daily., Disp: 180 tablet, Rfl: 3   aspirin  EC 81 MG tablet, Take 1 tablet (81 mg total) by mouth daily. Swallow whole., Disp: , Rfl:    Cholecalciferol  (VITAMIN D  PO), Take 1,000 Units by mouth daily., Disp: , Rfl:    furosemide  (LASIX ) 20 MG tablet, Take 2 tablets by mouth once daily, Disp: 60 tablet, Rfl: 0   glucose blood test strip, Use as instructed, Disp: 100 each, Rfl: 12   Lancet Device MISC, 1 Lancet  by Does not apply route daily as needed., Disp: 100 each, Rfl: 3   meloxicam  (MOBIC ) 7.5 MG tablet, Take 1 tablet by mouth once daily, Disp: 90 tablet, Rfl: 3   omeprazole  (PRILOSEC) 20 MG capsule, Take 1 capsule (20 mg total) by mouth daily., Disp: 30 capsule, Rfl: 3   OXYGEN , Inhale 2 L into the lungs continuous. , Disp: , Rfl:    pravastatin  (PRAVACHOL ) 40 MG tablet, Take 1 tablet by mouth once daily, Disp: 90 tablet, Rfl: 0   spironolactone  (ALDACTONE )  25 MG tablet, Take 0.5 tablets (12.5 mg total) by mouth daily., Disp: 45 tablet, Rfl: 3   irbesartan  (AVAPRO ) 150 MG tablet, Take 1 tablet (150 mg total) by mouth daily., Disp: 90 tablet, Rfl: 0   metoprolol  tartrate (LOPRESSOR ) 25 MG tablet, Take 1 tablet (25 mg total) by mouth daily in the afternoon., Disp: 90 tablet, Rfl: 0   tamsulosin  (FLOMAX ) 0.4 MG CAPS capsule, Take 1 capsule (0.4 mg total) by mouth at bedtime., Disp: 90 capsule, Rfl: 0        Objective:        BP 126/88 (BP Location: Right Arm, Patient Position: Sitting, Cuff Size: Large)   Pulse 70   Temp 98.5 F (36.9 C) (Temporal)   Ht 5' 9 (1.753 m)   Wt 275 lb 3.2 oz (124.8 kg)   SpO2 97% Comment: 2L pulsed  BMI 40.64 kg/m   Physical Exam ABDOMEN: Tenderness in the abdomen, particularly in the right lower quadrant, with improvement upon position change.  Wt Readings from Last 3 Encounters:  08/02/24 275 lb 3.2 oz (124.8 kg)  07/30/24 279 lb 6.4 oz (126.7 kg)  06/30/24 286 lb 4 oz (129.8 kg)    Physical Exam Vitals reviewed.  Constitutional:      General: He is not in acute distress.    Appearance: Normal appearance. He is normal weight. He is not ill-appearing, toxic-appearing or diaphoretic.  Cardiovascular:     Rate and Rhythm: Normal rate and regular rhythm.  Pulmonary:     Effort: Pulmonary effort is normal.     Breath sounds: Normal breath sounds.  Abdominal:     General: Abdomen is flat. There is no distension.     Palpations: Abdomen is soft.     Tenderness: There is abdominal tenderness in the right upper quadrant and epigastric area.     Hernia: Hernia: suspected inguinal right side, reducible but tender.     Comments: Periumbilical pain on palpation   Musculoskeletal:        General: Normal range of motion.  Neurological:     General: No focal deficit present.     Mental Status: He is alert and oriented to person, place, and time. Mental status is at baseline.  Psychiatric:        Mood  and Affect: Mood normal.        Behavior: Behavior normal.        Thought Content: Thought content normal.        Judgment: Judgment normal.          Results LABS A1c: 7.9 (06/30/2024) Creatinine: 1.5  Assessment & Plan:   Assessment and Plan Assessment & Plan Right lower abdominal pain, suspected hernia and constipation Acute right lower abdominal pain, possibly due to a hernia or constipation. Pain started a week ago, worsened yesterday, and improved this morning. No visible bulge or hernia noted. Pain is not exacerbated by movement. Constipation present, with last bowel movement 1-2  days ago. Nausea present but no vomiting.  - Ordered CT abdomen and pelvis with and without contrast to evaluate for hernia and other causes of pain. - Advised to seek immediate medical attention if pain becomes severe, bowel movements cease, or vomiting occurs.  Nausea possibly related to oral vitamin B12 supplementation Nausea possibly related to oral vitamin B12 supplementation. Nausea worsened after starting oral B12. No vomiting reported. - Discontinued oral vitamin B12 supplementation. - Administered B12 injection today.  Vitamin B12 deficiency Managed with oral supplementation, which may be causing nausea. - Administered B12 injection today. - Discontinued oral B12 supplementation.  Type 2 diabetes mellitus Previous A1c of 7.9. Glipizide  was discontinued due to concerns of hypoglycemia and dizziness. Discussion about starting GLP-1 receptor agonists like Ozempic or Mounjaro , but he is hesitant due to potential side effects and previous advice from a kidney doctor. GLP-1 receptor agonists may help prevent progression of kidney disease and cardiovascular disease, but can worsen constipation and cause pancreatitis. - Discussed potential benefits and risks of GLP-1 receptor agonists with him. Pt prefers to decline.  Chronic kidney disease Recent creatinine of 1.50 and eGFR of 49. Discussion  about potential use of GLP-1 receptor agonists for kidney protection. - Continue current management and monitor kidney function.  Paroxysmal supraventricular tachycardia Managed with metoprolol . - Continue metoprolol  as prescribed.  Chronic respiratory failure with hypoxia Chronic respiratory failure with hypoxia.  Tremor and unsteadiness on feet with weakness, possible Parkinsonism Tremor and unsteadiness with weakness, possibly indicative of Parkinsonism. Symptoms include hand tremors and balance issues. No formal diagnosis yet. - Referred to neurology for evaluation of possible Parkinsonism. - Referred to home health for physical therapy to address weakness and balance issues.  General Health Maintenance - Administered vitamin D  injection today.        Return in about 2 months (around 10/02/2024) for f/u diabetes.     Ginger Patrick, MSN, APRN, FNP-C [facility]

## 2024-08-02 NOTE — Telephone Encounter (Signed)
 You had mentioned to pt in office you might refer him to cardiology in your building, he would prefer this over Dr. Jordan due to location. Are you able to initiate the referral I am not sure who is in your building. Thanks!

## 2024-08-03 ENCOUNTER — Encounter: Payer: Self-pay | Admitting: *Deleted

## 2024-08-03 ENCOUNTER — Ambulatory Visit
Admission: RE | Admit: 2024-08-03 | Discharge: 2024-08-03 | Disposition: A | Discharge status: Home / Self Care | Source: Ambulatory Visit | Attending: Family | Admitting: Family

## 2024-08-03 DIAGNOSIS — R1031 Right lower quadrant pain: Secondary | ICD-10-CM | POA: Insufficient documentation

## 2024-08-03 DIAGNOSIS — R1085 Abdominal pain of multiple sites: Secondary | ICD-10-CM | POA: Insufficient documentation

## 2024-08-03 DIAGNOSIS — R110 Nausea: Secondary | ICD-10-CM | POA: Insufficient documentation

## 2024-08-03 MED ORDER — IOHEXOL 300 MG/ML  SOLN
100.0000 mL | Freq: Once | INTRAMUSCULAR | Status: AC | PRN
  Administered 2024-08-03: 100 mL via INTRAVENOUS

## 2024-08-03 MED ORDER — POTASSIUM CHLORIDE CRYS ER 20 MEQ PO TBCR: 20.0000 meq | EXTENDED_RELEASE_TABLET | Freq: Two times a day (BID) | ORAL | 0 refills | Status: DC

## 2024-08-03 NOTE — Addendum Note (Signed)
 Addended by: SHARL GRATE A on: 08/03/2024 08:47 AM   Modules accepted: Orders

## 2024-08-04 ENCOUNTER — Other Ambulatory Visit: Payer: Self-pay

## 2024-08-04 ENCOUNTER — Inpatient Hospital Stay (HOSPITAL_COMMUNITY)
Admission: EM | Admit: 2024-08-04 | Discharge: 2024-08-12 | DRG: 394 | Disposition: A | Discharge status: Home / Self Care | Attending: Internal Medicine | Admitting: Internal Medicine

## 2024-08-04 ENCOUNTER — Encounter (HOSPITAL_COMMUNITY): Payer: Self-pay | Admitting: Emergency Medicine

## 2024-08-04 ENCOUNTER — Telehealth: Payer: Self-pay

## 2024-08-04 DIAGNOSIS — B999 Unspecified infectious disease: Principal | ICD-10-CM | POA: Diagnosis present

## 2024-08-04 DIAGNOSIS — I471 Supraventricular tachycardia, unspecified: Secondary | ICD-10-CM | POA: Diagnosis present

## 2024-08-04 DIAGNOSIS — Z833 Family history of diabetes mellitus: Secondary | ICD-10-CM

## 2024-08-04 DIAGNOSIS — Z992 Dependence on renal dialysis: Secondary | ICD-10-CM

## 2024-08-04 DIAGNOSIS — G4733 Obstructive sleep apnea (adult) (pediatric): Secondary | ICD-10-CM | POA: Diagnosis present

## 2024-08-04 DIAGNOSIS — Z888 Allergy status to other drugs, medicaments and biological substances status: Secondary | ICD-10-CM

## 2024-08-04 DIAGNOSIS — K760 Fatty (change of) liver, not elsewhere classified: Secondary | ICD-10-CM | POA: Diagnosis present

## 2024-08-04 DIAGNOSIS — I5032 Chronic diastolic (congestive) heart failure: Secondary | ICD-10-CM | POA: Diagnosis not present

## 2024-08-04 DIAGNOSIS — I130 Hypertensive heart and chronic kidney disease with heart failure and stage 1 through stage 4 chronic kidney disease, or unspecified chronic kidney disease: Secondary | ICD-10-CM | POA: Diagnosis present

## 2024-08-04 DIAGNOSIS — K37 Unspecified appendicitis: Principal | ICD-10-CM | POA: Diagnosis present

## 2024-08-04 DIAGNOSIS — Z9049 Acquired absence of other specified parts of digestive tract: Secondary | ICD-10-CM

## 2024-08-04 DIAGNOSIS — Z832 Family history of diseases of the blood and blood-forming organs and certain disorders involving the immune mechanism: Secondary | ICD-10-CM

## 2024-08-04 DIAGNOSIS — I2510 Atherosclerotic heart disease of native coronary artery without angina pectoris: Secondary | ICD-10-CM | POA: Diagnosis present

## 2024-08-04 DIAGNOSIS — J9611 Chronic respiratory failure with hypoxia: Secondary | ICD-10-CM | POA: Diagnosis present

## 2024-08-04 DIAGNOSIS — E66813 Obesity, class 3: Secondary | ICD-10-CM | POA: Diagnosis present

## 2024-08-04 DIAGNOSIS — N179 Acute kidney failure, unspecified: Secondary | ICD-10-CM | POA: Diagnosis present

## 2024-08-04 DIAGNOSIS — Z79899 Other long term (current) drug therapy: Secondary | ICD-10-CM

## 2024-08-04 DIAGNOSIS — D72829 Elevated white blood cell count, unspecified: Secondary | ICD-10-CM | POA: Diagnosis present

## 2024-08-04 DIAGNOSIS — E785 Hyperlipidemia, unspecified: Secondary | ICD-10-CM | POA: Diagnosis present

## 2024-08-04 DIAGNOSIS — Z820 Family history of epilepsy and other diseases of the nervous system: Secondary | ICD-10-CM

## 2024-08-04 DIAGNOSIS — Z9981 Dependence on supplemental oxygen: Secondary | ICD-10-CM

## 2024-08-04 DIAGNOSIS — Z825 Family history of asthma and other chronic lower respiratory diseases: Secondary | ICD-10-CM

## 2024-08-04 DIAGNOSIS — K660 Peritoneal adhesions (postprocedural) (postinfection): Secondary | ICD-10-CM | POA: Diagnosis present

## 2024-08-04 DIAGNOSIS — Z801 Family history of malignant neoplasm of trachea, bronchus and lung: Secondary | ICD-10-CM

## 2024-08-04 DIAGNOSIS — K5730 Diverticulosis of large intestine without perforation or abscess without bleeding: Secondary | ICD-10-CM | POA: Diagnosis present

## 2024-08-04 DIAGNOSIS — Z7982 Long term (current) use of aspirin: Secondary | ICD-10-CM

## 2024-08-04 DIAGNOSIS — Z823 Family history of stroke: Secondary | ICD-10-CM

## 2024-08-04 DIAGNOSIS — I700 Atherosclerosis of aorta: Secondary | ICD-10-CM | POA: Diagnosis present

## 2024-08-04 DIAGNOSIS — Z8701 Personal history of pneumonia (recurrent): Secondary | ICD-10-CM

## 2024-08-04 DIAGNOSIS — N400 Enlarged prostate without lower urinary tract symptoms: Secondary | ICD-10-CM | POA: Diagnosis present

## 2024-08-04 DIAGNOSIS — M1990 Unspecified osteoarthritis, unspecified site: Secondary | ICD-10-CM | POA: Diagnosis present

## 2024-08-04 DIAGNOSIS — E1122 Type 2 diabetes mellitus with diabetic chronic kidney disease: Secondary | ICD-10-CM | POA: Diagnosis present

## 2024-08-04 DIAGNOSIS — E876 Hypokalemia: Secondary | ICD-10-CM | POA: Diagnosis present

## 2024-08-04 DIAGNOSIS — Z8249 Family history of ischemic heart disease and other diseases of the circulatory system: Secondary | ICD-10-CM

## 2024-08-04 DIAGNOSIS — Z85828 Personal history of other malignant neoplasm of skin: Secondary | ICD-10-CM

## 2024-08-04 DIAGNOSIS — M109 Gout, unspecified: Secondary | ICD-10-CM | POA: Diagnosis present

## 2024-08-04 DIAGNOSIS — Z87891 Personal history of nicotine dependence: Secondary | ICD-10-CM

## 2024-08-04 DIAGNOSIS — Z6841 Body Mass Index (BMI) 40.0 and over, adult: Secondary | ICD-10-CM

## 2024-08-04 DIAGNOSIS — Z8679 Personal history of other diseases of the circulatory system: Secondary | ICD-10-CM

## 2024-08-04 DIAGNOSIS — J449 Chronic obstructive pulmonary disease, unspecified: Secondary | ICD-10-CM

## 2024-08-04 DIAGNOSIS — Z9889 Other specified postprocedural states: Secondary | ICD-10-CM

## 2024-08-04 DIAGNOSIS — Z96642 Presence of left artificial hip joint: Secondary | ICD-10-CM | POA: Diagnosis present

## 2024-08-04 DIAGNOSIS — Z792 Long term (current) use of antibiotics: Secondary | ICD-10-CM

## 2024-08-04 DIAGNOSIS — J439 Emphysema, unspecified: Secondary | ICD-10-CM | POA: Diagnosis present

## 2024-08-04 DIAGNOSIS — Z66 Do not resuscitate: Secondary | ICD-10-CM | POA: Diagnosis present

## 2024-08-04 DIAGNOSIS — Z860100 Personal history of colon polyps, unspecified: Secondary | ICD-10-CM

## 2024-08-04 DIAGNOSIS — N1831 Chronic kidney disease, stage 3a: Secondary | ICD-10-CM | POA: Diagnosis present

## 2024-08-04 DIAGNOSIS — Z8349 Family history of other endocrine, nutritional and metabolic diseases: Secondary | ICD-10-CM

## 2024-08-04 DIAGNOSIS — E1165 Type 2 diabetes mellitus with hyperglycemia: Secondary | ICD-10-CM | POA: Diagnosis present

## 2024-08-04 DIAGNOSIS — I739 Peripheral vascular disease, unspecified: Secondary | ICD-10-CM

## 2024-08-04 LAB — URINALYSIS, W/ REFLEX TO CULTURE (INFECTION SUSPECTED)
Bilirubin Urine: NEGATIVE
Glucose, UA: NEGATIVE mg/dL
Hgb urine dipstick: NEGATIVE
Ketones, ur: NEGATIVE mg/dL
Leukocytes,Ua: NEGATIVE
Nitrite: NEGATIVE
Protein, ur: NEGATIVE mg/dL
Specific Gravity, Urine: 1.012 (ref 1.005–1.030)
pH: 6 (ref 5.0–8.0)

## 2024-08-04 LAB — COMPREHENSIVE METABOLIC PANEL WITH GFR
ALT: 13 U/L (ref 0–44)
AST: 16 U/L (ref 15–41)
Albumin: 3 g/dL — ABNORMAL LOW (ref 3.5–5.0)
Alkaline Phosphatase: 69 U/L (ref 38–126)
Anion gap: 18 — ABNORMAL HIGH (ref 5–15)
BUN: 16 mg/dL (ref 8–23)
CO2: 26 mmol/L (ref 22–32)
Calcium: 9.4 mg/dL (ref 8.9–10.3)
Chloride: 94 mmol/L — ABNORMAL LOW (ref 98–111)
Creatinine, Ser: 1.72 mg/dL — ABNORMAL HIGH (ref 0.61–1.24)
GFR, Estimated: 41 mL/min — ABNORMAL LOW (ref >60)
Glucose, Bld: 194 mg/dL — ABNORMAL HIGH (ref 70–99)
Potassium: 3.5 mmol/L (ref 3.5–5.1)
Sodium: 138 mmol/L (ref 135–145)
Total Bilirubin: 0.9 mg/dL (ref 0.0–1.2)
Total Protein: 7.2 g/dL (ref 6.5–8.1)

## 2024-08-04 LAB — CBC WITH DIFFERENTIAL/PLATELET
Abs Immature Granulocytes: 0.07 K/uL (ref 0.00–0.07)
Basophils Absolute: 0.1 K/uL (ref 0.0–0.1)
Basophils Relative: 1 %
Eosinophils Absolute: 0 K/uL (ref 0.0–0.5)
Eosinophils Relative: 0 %
HCT: 40.8 % (ref 39.0–52.0)
Hemoglobin: 13.3 g/dL (ref 13.0–17.0)
Immature Granulocytes: 1 %
Lymphocytes Relative: 15 %
Lymphs Abs: 2.2 K/uL (ref 0.7–4.0)
MCH: 28.4 pg (ref 26.0–34.0)
MCHC: 32.6 g/dL (ref 30.0–36.0)
MCV: 87.2 fL (ref 80.0–100.0)
Monocytes Absolute: 1 K/uL (ref 0.1–1.0)
Monocytes Relative: 7 %
Neutro Abs: 11.8 K/uL — ABNORMAL HIGH (ref 1.7–7.7)
Neutrophils Relative %: 76 %
Platelets: 338 K/uL (ref 150–400)
RBC: 4.68 MIL/uL (ref 4.22–5.81)
RDW: 13.9 % (ref 11.5–15.5)
WBC: 15.2 K/uL — ABNORMAL HIGH (ref 4.0–10.5)
nRBC: 0 % (ref 0.0–0.2)

## 2024-08-04 LAB — GLUCOSE, CAPILLARY: Glucose-Capillary: 174 mg/dL — ABNORMAL HIGH (ref 70–99)

## 2024-08-04 LAB — CBG MONITORING, ED: Glucose-Capillary: 202 mg/dL — ABNORMAL HIGH (ref 70–99)

## 2024-08-04 MED ORDER — ONDANSETRON HCL 4 MG/2ML IJ SOLN
4.0000 mg | Freq: Four times a day (QID) | INTRAMUSCULAR | Status: DC | PRN
  Filled 2024-08-04: qty 2

## 2024-08-04 MED ORDER — HYDROMORPHONE HCL 1 MG/ML IJ SOLN
0.5000 mg | Freq: Four times a day (QID) | INTRAMUSCULAR | Status: DC | PRN
  Administered 2024-08-04 – 2024-08-12 (×18): 0.5 mg via INTRAVENOUS
  Filled 2024-08-04 (×18): qty 0.5

## 2024-08-04 MED ORDER — INSULIN ASPART 100 UNIT/ML IJ SOLN
0.0000 [IU] | Freq: Three times a day (TID) | INTRAMUSCULAR | Status: DC
  Administered 2024-08-05 (×2): 3 [IU] via SUBCUTANEOUS
  Administered 2024-08-06 (×3): 2 [IU] via SUBCUTANEOUS
  Administered 2024-08-07: 3 [IU] via SUBCUTANEOUS
  Administered 2024-08-07: 2 [IU] via SUBCUTANEOUS
  Administered 2024-08-08 – 2024-08-09 (×4): 3 [IU] via SUBCUTANEOUS
  Administered 2024-08-09 (×2): 2 [IU] via SUBCUTANEOUS
  Administered 2024-08-10: 3 [IU] via SUBCUTANEOUS
  Administered 2024-08-10: 2 [IU] via SUBCUTANEOUS
  Administered 2024-08-10 – 2024-08-11 (×3): 3 [IU] via SUBCUTANEOUS
  Administered 2024-08-11: 1 [IU] via SUBCUTANEOUS
  Administered 2024-08-12: 3 [IU] via SUBCUTANEOUS
  Filled 2024-08-04: qty 2
  Filled 2024-08-04 (×4): qty 3
  Filled 2024-08-04 (×2): qty 2
  Filled 2024-08-04: qty 3
  Filled 2024-08-04: qty 2
  Filled 2024-08-04 (×2): qty 3
  Filled 2024-08-04: qty 2
  Filled 2024-08-04: qty 3
  Filled 2024-08-04 (×2): qty 2
  Filled 2024-08-04 (×3): qty 3
  Filled 2024-08-04: qty 2
  Filled 2024-08-04: qty 3

## 2024-08-04 MED ORDER — SENNOSIDES-DOCUSATE SODIUM 8.6-50 MG PO TABS: 1.0000 | ORAL_TABLET | Freq: Every evening | ORAL | Status: DC | PRN

## 2024-08-04 MED ORDER — ACETAMINOPHEN 325 MG PO TABS
650.0000 mg | ORAL_TABLET | Freq: Four times a day (QID) | ORAL | Status: DC | PRN
  Administered 2024-08-08 – 2024-08-11 (×6): 650 mg via ORAL
  Filled 2024-08-04 (×8): qty 2

## 2024-08-04 MED ORDER — BISACODYL 5 MG PO TBEC
5.0000 mg | DELAYED_RELEASE_TABLET | Freq: Every day | ORAL | Status: DC | PRN
Start: 2024-08-04 — End: 2024-08-12

## 2024-08-04 MED ORDER — VANCOMYCIN HCL 10 G IV SOLR
2500.0000 mg | Freq: Once | INTRAVENOUS | Status: AC
  Administered 2024-08-04: 2500 mg via INTRAVENOUS
  Filled 2024-08-04: qty 2500

## 2024-08-04 MED ORDER — ONDANSETRON HCL 4 MG PO TABS
4.0000 mg | ORAL_TABLET | Freq: Four times a day (QID) | ORAL | Status: DC | PRN
  Administered 2024-08-07 – 2024-08-10 (×2): 4 mg via ORAL
  Filled 2024-08-04 (×2): qty 1

## 2024-08-04 MED ORDER — PIPERACILLIN-TAZOBACTAM 3.375 G IVPB
3.3750 g | Freq: Three times a day (TID) | INTRAVENOUS | Status: DC
  Administered 2024-08-04 – 2024-08-08 (×11): 3.375 g via INTRAVENOUS
  Filled 2024-08-04 (×14): qty 50

## 2024-08-04 MED ORDER — ACETAMINOPHEN 650 MG RE SUPP: 650.0000 mg | Freq: Four times a day (QID) | RECTAL | Status: DC | PRN

## 2024-08-04 MED ORDER — ENOXAPARIN SODIUM 40 MG/0.4ML IJ SOSY
40.0000 mg | PREFILLED_SYRINGE | INTRAMUSCULAR | Status: DC
  Administered 2024-08-04 – 2024-08-08 (×5): 40 mg via SUBCUTANEOUS
  Filled 2024-08-04 (×5): qty 0.4

## 2024-08-04 MED ORDER — VANCOMYCIN HCL IN DEXTROSE 1-5 GM/200ML-% IV SOLN
1000.0000 mg | Freq: Once | INTRAVENOUS | Status: DC
Start: 2024-08-04 — End: 2024-08-04

## 2024-08-04 MED ORDER — INSULIN ASPART 100 UNIT/ML IJ SOLN
0.0000 [IU] | Freq: Every day | INTRAMUSCULAR | Status: DC
  Filled 2024-08-04: qty 3

## 2024-08-04 MED ORDER — SODIUM CHLORIDE 0.9 % IV BOLUS
1000.0000 mL | Freq: Once | INTRAVENOUS | Status: AC
  Administered 2024-08-04: 1000 mL via INTRAVENOUS

## 2024-08-04 NOTE — ED Provider Triage Note (Signed)
 Emergency Medicine Provider Triage Evaluation Note  Alfred Carney , a 73 y.o. male  was evaluated in triage.  Pt complains of abdominal pain with nausea.  Had outpatient CT yesterday that showed inflammation of iliac limb of bypass graft from possible adhesions.  Review of Systems  Positive: Pain, nausea Negative: Fever, chills, vomiting, blood in stool  Physical Exam  BP 111/67 (BP Location: Left Arm)   Pulse 85   Temp 97.6 F (36.4 C)   Resp 18   Ht 5' 9 (1.753 m)   Wt 124.8 kg   SpO2 98%   BMI 40.63 kg/m  Gen:   Awake, no distress, uncomfortable appearing Resp:  Normal effort  MSK:   Moves extremities without difficulty  Other:    Medical Decision Making  Medically screening exam initiated at 12:38 PM.  Appropriate orders placed.  Alfred Carney was informed that the remainder of the evaluation will be completed by another provider, this initial triage assessment does not replace that evaluation, and the importance of remaining in the ED until their evaluation is complete.  Labs ordered   [name], PA-C 08/04/24 1242

## 2024-08-04 NOTE — ED Triage Notes (Signed)
 Pt reports intermittent right sided abd pain with nausea for a week. Pt had CT scan yesterday.

## 2024-08-04 NOTE — ED Notes (Signed)
 5c approved for transport to inpatient unit

## 2024-08-04 NOTE — ED Provider Notes (Signed)
 [place] EMERGENCY DEPARTMENT AT [facility] Provider Note   CSN: [id number] Arrival date & time: 08/04/24  1217     Patient presents with: Abdominal Pain and Nausea   Alfred Carney is a 73 y.o. male presents today for abdominal pain with nausea x 1 week.  Patient had a CT scan yesterday which showed inflammation of iliac limb of bypass graft from possible adhesions and possible appendicitis.    Abdominal Pain Associated symptoms: nausea        Prior to Admission medications   Medication Sig Start Date End Date Taking? Authorizing Provider  albuterol  (VENTOLIN  HFA) 108 (90 Base) MCG/ACT inhaler Inhale 2 puffs into the lungs every 4 (four) hours as needed for wheezing or shortness of breath. 11/05/22   Darlean Ozell NOVAK, MD  allopurinol  (ZYLOPRIM ) 100 MG tablet Take 2 tablets (200 mg total) by mouth daily. 07/08/23 08/02/24  Corwin Antu, FNP  aspirin  EC 81 MG tablet Take 1 tablet (81 mg total) by mouth daily. Swallow whole. 03/19/24   Awanda City, MD  Cholecalciferol  (VITAMIN D  PO) Take 1,000 Units by mouth daily.    [provider]  furosemide  (LASIX ) 20 MG tablet Take 2 tablets by mouth once daily 07/20/24   Dugal, Tabitha, FNP  glucose blood test strip Use as instructed 05/29/23   Corwin Antu, FNP  irbesartan  (AVAPRO ) 150 MG tablet Take 1 tablet (150 mg total) by mouth daily. 08/02/24   Corwin Antu, FNP  Lancet Device MISC 1 Lancet by Does not apply route daily as needed. 05/29/23   Dugal, Tabitha, FNP  meloxicam  (MOBIC ) 7.5 MG tablet Take 1 tablet by mouth once daily 12/01/23   Dugal, Tabitha, FNP  metoprolol  tartrate (LOPRESSOR ) 25 MG tablet Take 1 tablet (25 mg total) by mouth daily in the afternoon. 08/02/24   Corwin Antu, FNP  omeprazole  (PRILOSEC) 20 MG capsule Take 1 capsule (20 mg total) by mouth daily. 05/10/24   Corwin Antu, FNP  OXYGEN  Inhale 2 L into the lungs continuous.     [provider]  potassium chloride  SA (KLOR-CON  M) 20 MEQ  tablet Take 1 tablet (20 mEq total) by mouth 2 (two) times daily for 7 days. 08/03/24 08/10/24  Dugal, Tabitha, FNP  pravastatin  (PRAVACHOL ) 40 MG tablet Take 1 tablet by mouth once daily 07/19/24   Dugal, Tabitha, FNP  spironolactone  (ALDACTONE ) 25 MG tablet Take 0.5 tablets (12.5 mg total) by mouth daily. 06/04/24 09/02/24  [place] LABOR, FNP  tamsulosin  (FLOMAX ) 0.4 MG CAPS capsule Take 1 capsule (0.4 mg total) by mouth at bedtime. 08/02/24   Dugal, Tabitha, FNP    Allergies: Glipizide  and Jardiance  [empagliflozin ]    Review of Systems  Gastrointestinal:  Positive for abdominal pain and nausea.    Updated Vital Signs BP 111/67 (BP Location: Left Arm)   Pulse 85   Temp 97.6 F (36.4 C)   Resp 18   Ht 5' 9 (1.753 m)   Wt 124.8 kg   SpO2 98%   BMI 40.63 kg/m   Physical Exam Vitals and nursing note reviewed.  Constitutional:      General: He is not in acute distress.    Appearance: He is well-developed.  HENT:     Head: Normocephalic and atraumatic.  Eyes:     Conjunctiva/sclera: Conjunctivae normal.  Cardiovascular:     Rate and Rhythm: Normal rate and regular rhythm.     Heart sounds: Normal heart sounds. No murmur heard. Pulmonary:     Effort:  Pulmonary effort is normal. No respiratory distress.     Breath sounds: Normal breath sounds.  Abdominal:     General: There is no distension.     Palpations: Abdomen is soft.     Tenderness: There is abdominal tenderness in the right lower quadrant. Positive signs include McBurney's sign. Negative signs include Murphy's sign and Rovsing's sign.  Musculoskeletal:        General: No swelling.     Cervical back: Neck supple.  Skin:    General: Skin is warm and dry.     Capillary Refill: Capillary refill takes less than 2 seconds.  Neurological:     General: No focal deficit present.     Mental Status: He is alert and oriented to person, place, and time.  Psychiatric:        Mood and Affect: Mood normal.     (all labs  ordered are listed, but only abnormal results are displayed) Labs Reviewed  COMPREHENSIVE METABOLIC PANEL WITH GFR - Abnormal; Notable for the following components:      Result Value   Chloride 94 (*)    Glucose, Bld 194 (*)    Creatinine, Ser 1.72 (*)    Albumin  3.0 (*)    GFR, Estimated 41 (*)    Anion gap 18 (*)    All other components within normal limits  CBC WITH DIFFERENTIAL/PLATELET - Abnormal; Notable for the following components:   WBC 15.2 (*)    Neutro Abs 11.8 (*)    All other components within normal limits  URINALYSIS, W/ REFLEX TO CULTURE (INFECTION SUSPECTED) - Abnormal; Notable for the following components:   Bacteria, UA RARE (*)    All other components within normal limits  CBG MONITORING, ED - Abnormal; Notable for the following components:   Glucose-Capillary 202 (*)    All other components within normal limits  CULTURE, BLOOD (ROUTINE X 2)  CULTURE, BLOOD (ROUTINE X 2)    EKG: None  Radiology: CT ABDOMEN PELVIS W CONTRAST Result Date: 08/03/2024 CLINICAL DATA:  Abdominal pain.  Hernia suspected. EXAM: CT ABDOMEN AND PELVIS WITH CONTRAST TECHNIQUE: Multidetector CT imaging of the abdomen and pelvis was performed using the standard protocol following bolus administration of intravenous contrast. RADIATION DOSE REDUCTION: This exam was performed according to the departmental dose-optimization program which includes automated exposure control, adjustment of the mA and/or kV according to patient size and/or use of iterative reconstruction technique. CONTRAST:  OMNIPAQUE  IOHEXOL  300 MG/ML  SOLN COMPARISON:  CT dated 01/28/2018. chest CT dated 03/26/2021. FINDINGS: Lower chest: Partially visualized calcified nodule at the left lung base measures 2 cm, increased in size since 2022. This was previously suggested to be a benign lesion. There is coronary vascular calcification. There is left is minus changes of the lung bases. No intra-abdominal free air or free  fluid. Hepatobiliary: Fatty appearing liver. No biliary dilatation. Cholecystectomy. Pancreas: Unremarkable. No pancreatic ductal dilatation or surrounding inflammatory changes. Spleen: Normal in size without focal abnormality. Adrenals/Urinary Tract: The adrenal glands unremarkable. There is mild bilateral renal parenchyma atrophy. Small nonobstructing bilateral renal calculi measure up to 4 mm in the inferior pole of the left kidney. No obstructing stone. No hydronephrosis on the left. There is mild right hydronephrosis likely secondary to decreased motility of the right ureter due to inflammatory changes of the aorta versus possible adhesion of the distal right ureter to the right common iliac artery bypass graph (60/2) and resulting stricture. The urinary bladder is collapsed. Stomach/Bowel: Small hiatal  hernia. Mild sigmoid diverticulosis. There is no bowel obstruction. The appendix appears to extend from the anterior right lower quadrant posterior medially with the tip of the appendix abutting the right common iliac artery bypass graft in similar location as the prior CT likely representing adhesion. There is inflammatory changes and thickening of the appendix and inflammatory changes and soft tissue thickening around the right common iliac limb of the bypass graft. Overall the epicenter of inflammatory process appears around the right iliac limb of the bypass graft rather than the appendix. Vascular/Lymphatic: Advanced aortoiliac atherosclerotic disease. Chronic narrowing and scarring of the distal native abdominal aorta and iliac arteries status post aortobifemoral bypass graft. Diffuse circumferential thickening and inflammatory changes along the right iliac limb of the bypass graft. There is infiltration of the surrounding fat plane. Tethering of several small bowel loops and tip of the appendix as well as distal right ureter to the right iliac limb of the bypass graft consistent with adhesions. The graft  remains patent. No portal venous gas. There is no adenopathy. Reproductive: The prostate is grossly unremarkable Other: None Musculoskeletal: Total left hip arthroplasty. Degenerative changes of the spine. No acute osseous pathology. IMPRESSION: 1. Aortobifemoral bypass graft with tethering of loops of small bowel, tip of the appendix, and the distal right ureter to the right iliac limb of the bypass graft consistent with adhesions. 2. Inflammatory changes and circumferential thickening of the right iliac limb of the bypass graft as well as thickening of the appendix. Findings favored to represent an inflammatory/infectious process involving the right iliac limb of the bypass graft with associated reactive inflammation/thickening of the appendix and less likely an acute appendicitis and resulting inflammation of the right iliac limb of the bypass graft. Clinical correlation and vascular surgery consult is advised. 3. Mild right hydronephrosis likely secondary to adhesion of the distal right ureter to the right iliac limb of the bypass graft. 4. Small nonobstructing bilateral renal calculi. 5. Fatty liver. 6.  Aortic Atherosclerosis (ICD10-I70.0). Electronically Signed   By: Vanetta Chou M.D.   On: 08/03/2024 15:57     Procedures   Medications Ordered in the ED  sodium chloride  0.9 % bolus 1,000 mL (has no administration in time range)  vancomycin  (VANCOCIN ) 2,500 mg in sodium chloride  0.9 % 500 mL IVPB (has no administration in time range)  piperacillin -tazobactam (ZOSYN ) IVPB 3.375 g (has no administration in time range)                                    Medical Decision Making Amount and/or Complexity of Data Reviewed Labs: ordered.  Risk Prescription drug management.   This patient presents to the ED for concern of abdominal pain and nausea, this involves an extensive number of treatment options, and is a complaint that carries with it a high risk of complications and morbidity.  The  differential diagnosis includes intra-abdominal adhesions, appendicitis, pancreatitis, choledocholithiasis, acute cholecystitis, SBO   Additional history obtained:  Additional history obtained from EMR External records from outside source obtained and reviewed including family medicine notes   Lab Tests:  I Ordered, and personally interpreted labs.  The pertinent results include: Chloride 94, elevated creatinine at 1.72 from a baseline of approximately 1.47, anion gap 18, albumin  3, leukocytosis at 15.2, UA with rare bacteria Blood cultures pending   Problem List / ED Course / Critical interventions / Medication management  I ordered medication including  vancomycin , Zosyn , IVF I have reviewed the patients home medicines and have made adjustments as needed   Consultations Obtained:  I requested consultation with the vascular surgery, Dr. Gretta,  and discussed lab and imaging findings as well as pertinent plan - they recommend: Broad-spectrum antibiotics, blood cultures, medicine admission with general surgery consultation Consulted general surgery, Dr. Ann recommended admission and stated that she would see the patient while admitted To hospitalist, Dr. Lou he is agreeable to admission   Test / Admission - Considered:  Admit      Final diagnoses:  Intra-abdominal infection    ED Discharge Orders     None          [name], PA-C 08/04/24 1827    Franklyn Sid SAILOR, MD 08/04/24 [phone]

## 2024-08-04 NOTE — Consult Note (Addendum)
 Reason for Consult:  Possible appendicitis Referring Provider: Francis, [place]  HPI  Alfred Carney is an 73 y.o. male with multiple comorbidities who was directed to ED by his PCP after abnormal CT imaging findings yesterday.  Patient has had week or so of intermittent abdominal pain. Denies fevers/chills. Patient states pain has resolved at time of my exam. He has reported some nausea occasionally without emesis as well as anorexia. He states pain was more severe prior days but today has been minimal to none. He has been hemodynamically stable.  Labs notable for leukocytosis to 15.2. CT with inflammation of right iliac limb of bypass graft and appendiceal thickening. Unclear what is primary source of inflammation--graft versus appendix. There is also tethering of several loops of small intestine and appendix and distal right ureter to graft.  10 point review of systems is negative except as listed above in HPI.  Objective  Past Medical History: Past Medical History:  Diagnosis Date   Chronic obstructive pulmonary disease (HCC) 2008   Moderate   Colon polyps    Community acquired pneumonia    Coronary artery disease    Degenerative joint disease    Diabetes mellitus without complication (HCC)    type 2   Emphysema lung (HCC)    Enlarged liver    fatty liver by '09 CT   Gastroesophageal reflux disease    Gout    H/O hiatal hernia    History of [place] spotted fever    Possible history of [place] Spotted Fever   Hyperlipidemia    Hypertension    Hypokalemia    diuretic induced, resolved   Microcytic anemia    iron pills   Morbid obesity (HCC)    Shortness of breath    Skin cancer    skin cancer lip removed   Sleep apnea    not currently using CPAP 05/20/13   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 LEVEL  10/23/2018       ESOPHAGOGASTRODUODENOSCOPY (EGD) WITH PROPOFOL  N/A 01/22/2018   Procedure: ESOPHAGOGASTRODUODENOSCOPY (EGD) WITH PROPOFOL ;  Surgeon: Shila Gustav GAILS, MD;  Location: [facility];  Service: Endoscopy;  Laterality: N/A;   HARDWARE REMOVAL Left 05/26/2013   Procedure: HARDWARE REMOVAL;  Surgeon: Jerona GAILS Sage, MD;  Location: [facility] OR;  Service: Orthopedics;  Laterality: Left;  Left Total Hip Arthroplasty, Removal of Deep Hardware   HIP SURGERY     Status post left hip surgery with bone grafting   IR INJECT/THERA/INC NEEDLE/CATH/PLC EPI/LUMB/SAC W/IMG  10/23/2018   LEFT HEART CATHETERIZATION WITH CORONARY ANGIOGRAM N/A 01/31/2014   Procedure: LEFT HEART CATHETERIZATION WITH CORONARY ANGIOGRAM;  Surgeon: Lonni JONETTA Cash, MD;  Location: [facility] CATH LAB;  Service: Cardiovascular;  Laterality: N/A;   LUMBAR LAMINECTOMY/DECOMPRESSION MICRODISCECTOMY Left 10/28/2018   Procedure: Left Lumbar three-four Extraforaminal microdiscectomy;  Surgeon: Alix Charleston, MD;  Location: [facility] OR;  Service: Neurosurgery;  Laterality: Left;   THORACOABDOMINAL AORTIC ANEURYSM REPAIR     with right femoral and left iliac BPG and reimplantation of renal arteries.   TONSILLECTOMY  TONSILLECTOMY     TOTAL HIP ARTHROPLASTY Left 05/26/2013   Procedure: TOTAL HIP ARTHROPLASTY;  Surgeon: Jerona LULLA Sage, MD;  Location: [facility] OR;  Service: Orthopedics;  Laterality: Left;  Left Total Hip Arthroplasty, Removal Deep Hardware    Family History:  Family History  Problem Relation Age of Onset   Osteoarthritis Mother    Diabetes Mother    Pulmonary embolism Mother    Heart disease Father        Coronary Artery Disease   Stroke Father    Multiple  sclerosis Sister    Factor V Leiden deficiency Sister    Emphysema Sister    Hyperlipidemia Brother    Heart attack Maternal Grandmother    Lung cancer Maternal Grandfather        smoked    Social History:  reports that he quit smoking about 17 years ago. His smoking use included cigarettes. He started smoking about 52 years ago. He has a 35 pack-year smoking history. He has never used smokeless tobacco. He reports that he does not drink alcohol and does not use drugs.  Allergies:  Allergies  Allergen Reactions   Glipizide  Other (See Comments)    dizziness   Jardiance  [Empagliflozin ] Other (See Comments)    Stomach upset    Medications: I have reviewed the patient's current medications.  Labs: I have personally reviewed all labs for the past 24h  Imaging: I have personally reviewed and interpreted all imaging for the past 24h and agree with the radiologist's impression.  No results found.   Physical Exam Blood pressure 111/67, pulse 85, temperature 97.6 F (36.4 C), resp. rate 18, height 5' 9 (1.753 m), weight 124.8 kg, SpO2 98%. General: no acute distress HEENT: normocephalic, atraumatic Oropharynx: mucous membranes moist CV: Regular rate and rhythm, normotensive Chest: equal chest rise bilaterally normal respiratory effort on 2L [place] Abdomen: soft, nondistended, and very minimally tender to deep palpation only Extremities: moves all extremities Skin: warm, dry, no rashes Psych: normal memory, normal mood/affect  Neuro: No focal neurologic deficits, A&Ox3    Assessment   Alfred Carney is an 73 y.o. male with abdominal pain and concern for possible iliac limb/bypass graft infection with reactive inflammation of appendix versus primary appendicitis with resultant inflammation of iliac limb/graft.  Plan  - Agree with broad spectrum antibiotics while awaiting blood cultures - FEN - CLD given clinical stability and resolution of pain. If patient begins to have worsening  pain or concerning vitals would make NPO - Even if primary source of infection/inflammation is appendix, given no appendicolith, would not recommend surgery at this time given then tethering to his graft and involvement of ureter and several loops of small intestine making significant adhesions making the likelihood of needing a more extensive laparotomy with risk of injury to any and all of the structure more likely. Reasonable to pursue nonoperative management with antibiotics alone. - Plan for repeat CT in 48-72 per vascular surgery, agree with this plan. - DVT - SCDs, ok for DVT ppx from surgical perspective  I reviewed ED provider notes, last 24 h vitals and pain scores, last 48 h intake and output, last 24 h labs and trends, last 24 h imaging results, and I discussed patient and plan of care directly with vascular surgeon, Dr. Gretta as well as EDP.  This care required high  level of medical decision making.   Orie Silversmith, MD General Surgery, Surgical Critical Care and Trauma

## 2024-08-04 NOTE — ED Notes (Signed)
 Vascular at bedside

## 2024-08-04 NOTE — Telephone Encounter (Unsigned)
 Copied from CRM #[id number]. Topic: Clinical - Medical Advice >> Aug 04, 2024  4:16 PM Rea C wrote: Reason for CRM: Patient's wife called in and stated that they are currently at [facility] with patient. They have been there for three hours, labs were taken, and patient has not been seen yet. Patients wife asked if NP Dugal or Manuelita was in the office because she said that NP Dugal recommended that patient goes to [facility] but I didn't see any notes left indicating.   [phone] (M)

## 2024-08-04 NOTE — H&P (Signed)
 History and Physical  Alfred Carney FMW:[id number] DOB: 01-Apr-1951 DOA: 08/04/2024  PCP: Corwin Antu, FNP   Chief Complaint: Abdominal pain  HPI: Alfred Carney is a 73 y.o. male with medical history significant for COPD, chronic hypoxic respiratory failure on 2 L [place], chronic diastolic HF, HTN, HLD, CKD 3A, class III obesity, T2DM, OSA, GERD, PAD s/p bypass, CAD, carotid stenosis and thoracoabdominal aneurysm s/p repair who presented to the ED for evaluation of abdominal pain. Patient reports 4 to 5 days of nagging, intermittent right lower quadrant pain.  He endorsed associated nausea and chronic leg swelling but denies any vomiting, fever, chills, shortness of breath, chest pain, dysuria, bloody stools, leg pain or diarrhea.  He was seen by his PCP on 11/17 for evaluation and a CT abdomen pelvis with contrast was obtained yesterday for further evaluation. Results today showed evidence of inflammatory changes around the right limb of his bypass graft as well as thickening of his appendix concerning for infection. Patient was called to present to the ED for further evaluation  ED Course: Initial vitals show patient afebrile and normotensive, SpO2 98% on 2 L [place]. Initial labs significant for creatinine 1.72, glucose 194, WBC 15.2, UA with no signs of infection. Pt received IV NS 1 L bolus, IV vancomycin  and IV Zosyn .  Vascular surgery and general surgery were consulted for evaluation. [facility] was consulted for admission.   Review of Systems: Please see HPI for pertinent positives and negatives. A complete 10 system review of systems are otherwise negative.  Past Medical History:  Diagnosis Date   Chronic obstructive pulmonary disease (HCC) 2008   Moderate   Colon polyps    Community acquired pneumonia    Coronary artery disease    Degenerative joint disease    Diabetes mellitus without complication (HCC)    type 2   Emphysema lung (HCC)    Enlarged liver    fatty liver by '09 CT    Gastroesophageal reflux disease    Gout    H/O hiatal hernia    History of [place] spotted fever    Possible history of [place] Spotted Fever   Hyperlipidemia    Hypertension    Hypokalemia    diuretic induced, resolved   Microcytic anemia    iron pills   Morbid obesity (HCC)    Shortness of breath    Skin cancer    skin cancer lip removed   Sleep apnea    not currently using CPAP 05/20/13   Thoracoabdominal aneurysm    status post vascular surgery repair   Past Surgical History:  Procedure Laterality Date   CARDIAC CATHETERIZATION  2007   Ejection fraction is estimated at 60%   CHOLECYSTECTOMY     COLONOSCOPY WITH PROPOFOL  N/A 01/22/2018   Procedure: COLONOSCOPY WITH PROPOFOL ;  Surgeon: Shila Gustav GAILS, MD;  Location: [facility];  Service: Endoscopy;  Laterality: N/A;   DG NERVE ROOT BLOCK LUMBAR-SACRAL EACH ADD. LEVEL  10/23/2018       ESOPHAGOGASTRODUODENOSCOPY (EGD) WITH PROPOFOL  N/A 01/22/2018   Procedure: ESOPHAGOGASTRODUODENOSCOPY (EGD) WITH PROPOFOL ;  Surgeon: Shila Gustav GAILS, MD;  Location: [facility];  Service: Endoscopy;  Laterality: N/A;   HARDWARE REMOVAL Left 05/26/2013   Procedure: HARDWARE REMOVAL;  Surgeon: Jerona GAILS Sage, MD;  Location: [facility] OR;  Service: Orthopedics;  Laterality: Left;  Left Total Hip Arthroplasty, Removal of Deep Hardware   HIP SURGERY     Status post left hip surgery with bone grafting   IR INJECT/THERA/INC  NEEDLE/CATH/PLC EPI/LUMB/SAC W/IMG  10/23/2018   LEFT HEART CATHETERIZATION WITH CORONARY ANGIOGRAM N/A 01/31/2014   Procedure: LEFT HEART CATHETERIZATION WITH CORONARY ANGIOGRAM;  Surgeon: Lonni JONETTA Cash, MD;  Location: [facility] CATH LAB;  Service: Cardiovascular;  Laterality: N/A;   LUMBAR LAMINECTOMY/DECOMPRESSION MICRODISCECTOMY Left 10/28/2018   Procedure: Left Lumbar three-four Extraforaminal microdiscectomy;  Surgeon: Alix Charleston, MD;  Location: [facility] OR;  Service: Neurosurgery;  Laterality: Left;    THORACOABDOMINAL AORTIC ANEURYSM REPAIR     with right femoral and left iliac BPG and reimplantation of renal arteries.   TONSILLECTOMY     TONSILLECTOMY     TOTAL HIP ARTHROPLASTY Left 05/26/2013   Procedure: TOTAL HIP ARTHROPLASTY;  Surgeon: Jerona LULLA Sage, MD;  Location: [facility] OR;  Service: Orthopedics;  Laterality: Left;  Left Total Hip Arthroplasty, Removal Deep Hardware   Social History:  reports that he quit smoking about 17 years ago. His smoking use included cigarettes. He started smoking about 52 years ago. He has a 35 pack-year smoking history. He has never used smokeless tobacco. He reports that he does not drink alcohol and does not use drugs.  Allergies  Allergen Reactions   Glipizide  Other (See Comments)    dizziness   Jardiance  [Empagliflozin ] Other (See Comments)    Stomach upset    Family History  Problem Relation Age of Onset   Osteoarthritis Mother    Diabetes Mother    Pulmonary embolism Mother    Heart disease Father        Coronary Artery Disease   Stroke Father    Multiple sclerosis Sister    Factor V Leiden deficiency Sister    Emphysema Sister    Hyperlipidemia Brother    Heart attack Maternal Grandmother    Lung cancer Maternal Grandfather        smoked     Prior to Admission medications   Medication Sig Start Date End Date Taking? Authorizing Provider  albuterol  (VENTOLIN  HFA) 108 (90 Base) MCG/ACT inhaler Inhale 2 puffs into the lungs every 4 (four) hours as needed for wheezing or shortness of breath. 11/05/22   Darlean Ozell NOVAK, MD  allopurinol  (ZYLOPRIM ) 100 MG tablet Take 2 tablets (200 mg total) by mouth daily. 07/08/23 08/02/24  Corwin Antu, FNP  aspirin  EC 81 MG tablet Take 1 tablet (81 mg total) by mouth daily. Swallow whole. 03/19/24   Awanda City, MD  Cholecalciferol  (VITAMIN D  PO) Take 1,000 Units by mouth daily.    [provider]  furosemide  (LASIX ) 20 MG tablet Take 2 tablets by mouth once daily 07/20/24   Dugal, Tabitha, FNP  glucose  blood test strip Use as instructed 05/29/23   Corwin Antu, FNP  irbesartan  (AVAPRO ) 150 MG tablet Take 1 tablet (150 mg total) by mouth daily. 08/02/24   Corwin Antu, FNP  Lancet Device MISC 1 Lancet by Does not apply route daily as needed. 05/29/23   Dugal, Tabitha, FNP  meloxicam  (MOBIC ) 7.5 MG tablet Take 1 tablet by mouth once daily 12/01/23   Dugal, Tabitha, FNP  metoprolol  tartrate (LOPRESSOR ) 25 MG tablet Take 1 tablet (25 mg total) by mouth daily in the afternoon. 08/02/24   Corwin Antu, FNP  omeprazole  (PRILOSEC) 20 MG capsule Take 1 capsule (20 mg total) by mouth daily. 05/10/24   Corwin Antu, FNP  OXYGEN  Inhale 2 L into the lungs continuous.     [provider]  potassium chloride  SA (KLOR-CON  M) 20 MEQ tablet Take 1 tablet (20 mEq total) by mouth 2 (  two) times daily for 7 days. 08/03/24 08/10/24  Dugal, Tabitha, FNP  pravastatin  (PRAVACHOL ) 40 MG tablet Take 1 tablet by mouth once daily 07/19/24   Dugal, Tabitha, FNP  spironolactone  (ALDACTONE ) 25 MG tablet Take 0.5 tablets (12.5 mg total) by mouth daily. 06/04/24 09/02/24  Donette Ellouise LABOR, FNP  tamsulosin  (FLOMAX ) 0.4 MG CAPS capsule Take 1 capsule (0.4 mg total) by mouth at bedtime. 08/02/24   Corwin Antu, FNP    Physical Exam: BP (!) 130/56 (BP Location: Left Arm)   Pulse 94   Temp 98.4 F (36.9 C) (Oral)   Resp 20   Ht 5' 9 (1.753 m)   Wt 124.8 kg   SpO2 100%   BMI 40.63 kg/m  General: Pleasant, well-appearing obese elderly man sitting in bed eating. No acute distress. HEENT: [place]/AT. Anicteric sclera CV: RRR. No murmurs, rubs, or gallops.  Pulmonary: Lungs CTAB. Normal effort. No wheezing or rales. Abdominal: Soft, nondistended. Mild tenderness to deep palpation of RLQ. Normal bowel sounds. Extremities: Chronic BLE edema. Normal ROM. Skin: Warm and dry. No obvious rash or lesions. Neuro: A&Ox3. Moves all extremities. Normal sensation to light touch. No focal deficit. Psych: Normal mood and affect           Labs on Admission:  Basic Metabolic Panel: Recent Labs  Lab 08/02/24 1230 08/04/24 1244  NA 141 138  K 3.1* 3.5  CL 95* 94*  CO2 31 26  GLUCOSE 170* 194*  BUN 18 16  CREATININE 1.47 1.72*  CALCIUM  9.4 9.4   Liver Function Tests: Recent Labs  Lab 08/02/24 1230 08/04/24 1244  AST 10 16  ALT 10 13  ALKPHOS 68 69  BILITOT 0.9 0.9  PROT 7.0 7.2  ALBUMIN  3.7 3.0*   Recent Labs  Lab 08/02/24 1230  LIPASE 45.0  AMYLASE 35   No results for input(s): AMMONIA in the last 168 hours. CBC: Recent Labs  Lab 08/02/24 1230 08/04/24 1244  WBC 14.3* 15.2*  NEUTROABS 10.8* 11.8*  HGB 13.1 13.3  HCT 39.7 40.8  MCV 85.6 87.2  PLT 370.0 338   Cardiac Enzymes: No results for input(s): CKTOTAL, CKMB, CKMBINDEX, TROPONINI in the last 168 hours. BNP (last 3 results) Recent Labs    03/11/24 0818 05/11/24 1141  BNP 116.1* 126.1*    ProBNP (last 3 results) Recent Labs    05/10/24 1224  PROBNP 89.0    CBG: Recent Labs  Lab 08/04/24 1420 08/04/24 2126  GLUCAP 202* 174*    Radiological Exams on Admission: CT ABDOMEN PELVIS W CONTRAST Result Date: 08/03/2024 CLINICAL DATA:  Abdominal pain.  Hernia suspected. EXAM: CT ABDOMEN AND PELVIS WITH CONTRAST TECHNIQUE: Multidetector CT imaging of the abdomen and pelvis was performed using the standard protocol following bolus administration of intravenous contrast. RADIATION DOSE REDUCTION: This exam was performed according to the departmental dose-optimization program which includes automated exposure control, adjustment of the mA and/or kV according to patient size and/or use of iterative reconstruction technique. CONTRAST:  OMNIPAQUE  IOHEXOL  300 MG/ML  SOLN COMPARISON:  CT dated 01/28/2018. chest CT dated 03/26/2021. FINDINGS: Lower chest: Partially visualized calcified nodule at the left lung base measures 2 cm, increased in size since 2022. This was previously suggested to be a benign lesion. There is  coronary vascular calcification. There is left is minus changes of the lung bases. No intra-abdominal free air or free fluid. Hepatobiliary: Fatty appearing liver. No biliary dilatation. Cholecystectomy. Pancreas: Unremarkable. No pancreatic ductal dilatation or surrounding inflammatory changes. Spleen:  Normal in size without focal abnormality. Adrenals/Urinary Tract: The adrenal glands unremarkable. There is mild bilateral renal parenchyma atrophy. Small nonobstructing bilateral renal calculi measure up to 4 mm in the inferior pole of the left kidney. No obstructing stone. No hydronephrosis on the left. There is mild right hydronephrosis likely secondary to decreased motility of the right ureter due to inflammatory changes of the aorta versus possible adhesion of the distal right ureter to the right common iliac artery bypass graph (60/2) and resulting stricture. The urinary bladder is collapsed. Stomach/Bowel: Small hiatal hernia. Mild sigmoid diverticulosis. There is no bowel obstruction. The appendix appears to extend from the anterior right lower quadrant posterior medially with the tip of the appendix abutting the right common iliac artery bypass graft in similar location as the prior CT likely representing adhesion. There is inflammatory changes and thickening of the appendix and inflammatory changes and soft tissue thickening around the right common iliac limb of the bypass graft. Overall the epicenter of inflammatory process appears around the right iliac limb of the bypass graft rather than the appendix. Vascular/Lymphatic: Advanced aortoiliac atherosclerotic disease. Chronic narrowing and scarring of the distal native abdominal aorta and iliac arteries status post aortobifemoral bypass graft. Diffuse circumferential thickening and inflammatory changes along the right iliac limb of the bypass graft. There is infiltration of the surrounding fat plane. Tethering of several small bowel loops and tip of the  appendix as well as distal right ureter to the right iliac limb of the bypass graft consistent with adhesions. The graft remains patent. No portal venous gas. There is no adenopathy. Reproductive: The prostate is grossly unremarkable Other: None Musculoskeletal: Total left hip arthroplasty. Degenerative changes of the spine. No acute osseous pathology. IMPRESSION: 1. Aortobifemoral bypass graft with tethering of loops of small bowel, tip of the appendix, and the distal right ureter to the right iliac limb of the bypass graft consistent with adhesions. 2. Inflammatory changes and circumferential thickening of the right iliac limb of the bypass graft as well as thickening of the appendix. Findings favored to represent an inflammatory/infectious process involving the right iliac limb of the bypass graft with associated reactive inflammation/thickening of the appendix and less likely an acute appendicitis and resulting inflammation of the right iliac limb of the bypass graft. Clinical correlation and vascular surgery consult is advised. 3. Mild right hydronephrosis likely secondary to adhesion of the distal right ureter to the right iliac limb of the bypass graft. 4. Small nonobstructing bilateral renal calculi. 5. Fatty liver. 6.  Aortic Atherosclerosis (ICD10-I70.0). Electronically Signed   By: Vanetta Chou M.D.   On: 08/03/2024 15:57   Assessment/Plan Alfred Carney is a 73 y.o. male with medical history significant for COPD, chronic hypoxic respiratory failure on 2 L [place], chronic diastolic HF, HTN, HLD, CKD 3A, class III obesity, T2DM, OSA, GERD, PAD s/p bypass, CAD, carotid stenosis and thoracoabdominal aneurysm s/p repair who presented to the ED for evaluation of abdominal pain and admitted for intra-abdominal infection.  # Intra-abdominal infection - Patient presented with 1 week of intermittent RLQ pain with associated nausea - CT abdomen pelvis from PCP office shows some inflammation around the right  limb of the graft including some tethered small bowel, tip of the appendix, distal right ureter and concern for appendix inflammation  - General Surgery and vascular surgery consulted, appreciate recs - They are recommending continuing IV antibiotics for now with repeat imaging in 48 to 72 hours - Continue IV Zosyn  - IV Dilaudid  as  needed for pain - Follow-up blood culture - Trend CBC and fever curve  # PAD s/p bypass # Hx of thoracoabdominal aneurysm - Vascular surgery following, appreciate recs - Continue pravastatin   # T2DM with hyperglycemia - Last A1c 7.4% 1 month ago, blood sugar 194 on admission - SSI with meals, CBG monitoring  # AKI on CKD 3A - Slight rise in creatinine to 1.72 from baseline of 1.2-1.5 - Status post 1 L IV NS bolus - Follow-up repeat creatinine and reassess  # HTN - BP stable with SBP in the 110-130s - Continue spironolactone , metoprolol  and irbesartan   # Chronic diastolic heart failure # Chronic lower extremity swelling - Patient with chronic lower extremity edema but no signs of CHF exacerbation - Continue Lasix , metoprolol , irbesartan  and spironolactone   # COPD # Chronic hypoxic respiratory failure - Chronic and stable, no signs of COPD exacerbation - Remains on baseline 2 L [place], continue - As needed DuoNebs  # Hx of PSVT - Continue metoprolol   # HLD # CAD - Continue pravastatin   # Gout - Continue allopurinol   # BPH - Continue tamsulosin   # OSA - Not on CPAP  # Class III obesity Body mass index is 40.63 kg/m. Filed Weights   08/04/24 1238  Weight: 124.8 kg  - F/u with PCP for weight lost and nutrition counseling  DVT prophylaxis: Lovenox      Code Status: Limited: Do not attempt resuscitation (DNR) -DNR-LIMITED -Do Not Intubate/DNI   Consults called: Vascular surgery, general surgery  Family Communication: Discussed findings/results and admission plan with family at bedside  Severity of Illness: The appropriate patient  status for this patient is INPATIENT. Inpatient status is judged to be reasonable and necessary in order to provide the required intensity of service to ensure the patient's safety. The patient's presenting symptoms, physical exam findings, and initial radiographic and laboratory data in the context of their chronic comorbidities is felt to place them at high risk for further clinical deterioration. Furthermore, it is not anticipated that the patient will be medically stable for discharge from the hospital within 2 midnights of admission.   * I certify that at the point of admission it is my clinical judgment that the patient will require inpatient hospital care spanning beyond 2 midnights from the point of admission due to high intensity of service, high risk for further deterioration and high frequency of surveillance required.*  Level of care: Med-Surg   I personally spent a total of 75 minutes in the care of the patient today including preparing to see the patient, getting/reviewing separately obtained history, performing a medically appropriate exam/evaluation, placing orders, documenting clinical information in the EHR, and communicating results.   Lou Claretta HERO, MD 08/05/2024, 12:33 AM [facility] Pager: [phone] Isaiah 41:10   If 7PM-7AM, please contact night-coverage [email or web address] Password [email or web address]

## 2024-08-04 NOTE — Consult Note (Addendum)
 Hospital Consult    Reason for Consult:  abdominal pain  Requesting Physician:   MRN #:  [id number]  History of Present Illness: This is a 73 y.o. male with past medical history significant for COPD on 2L [place], CAD, Type II DM, OSA, Morbid obesity, HTN, HLD, GERD, carotid stenosis and thoracoabdominal aneurysm presenting at recommendation of his PCP to the [facility] ER due to abdominal pain after having a CT scan at [facility]. He explains he has had right lower quadrant 7/10 pain that started about 1 week ago. He reports decreased appetite and has not had a recent bowel movement. He denies any nausea, vomiting, diarrhea, or blood in his stool. He also reports he has been experiencing some dizziness. He denies any pain in his legs. He does report swelling. He has had several recent falls and uses a walker mostly for ambulation. He denies any fever but reports subjective chills. He was recently treated for pneumonia in August other then that denies any recent infection. He is known to our practice from his history of left thoracotomy for aorta-right femoral to left external iliac artery bypass with implantation of two right renal arteries and one left renal artery on 03/15/2003 by Dr. Eliza and Dr. Dyane. He underwent CTA evaluation yesterday showing some evidence of inflammatory changes around the right limb of his bypass graft as well as thickening of his appendix concerning for infection. Vascular surgery was consulted on arrival to ER for evaluation.   Past Medical History:  Diagnosis Date   Chronic obstructive pulmonary disease (HCC) 2008   Moderate   Colon polyps    Community acquired pneumonia    Coronary artery disease    Degenerative joint disease    Diabetes mellitus without complication (HCC)    type 2   Emphysema lung (HCC)    Enlarged liver    fatty liver by '09 CT   Gastroesophageal reflux disease    Gout    H/O hiatal hernia    History of [place] spotted fever    Possible history  of [place] Spotted Fever   Hyperlipidemia    Hypertension    Hypokalemia    diuretic induced, resolved   Microcytic anemia    iron pills   Morbid obesity (HCC)    Shortness of breath    Skin cancer    skin cancer lip removed   Sleep apnea    not currently using CPAP 05/20/13   Thoracoabdominal aneurysm    status post vascular surgery repair    Past Surgical History:  Procedure Laterality Date   CARDIAC CATHETERIZATION  2007   Ejection fraction is estimated at 60%   CHOLECYSTECTOMY     COLONOSCOPY WITH PROPOFOL  N/A 01/22/2018   Procedure: COLONOSCOPY WITH PROPOFOL ;  Surgeon: Shila Gustav GAILS, MD;  Location: [facility];  Service: Endoscopy;  Laterality: N/A;   DG NERVE ROOT BLOCK LUMBAR-SACRAL EACH ADD. LEVEL  10/23/2018       ESOPHAGOGASTRODUODENOSCOPY (EGD) WITH PROPOFOL  N/A 01/22/2018   Procedure: ESOPHAGOGASTRODUODENOSCOPY (EGD) WITH PROPOFOL ;  Surgeon: Shila Gustav GAILS, MD;  Location: [facility];  Service: Endoscopy;  Laterality: N/A;   HARDWARE REMOVAL Left 05/26/2013   Procedure: HARDWARE REMOVAL;  Surgeon: Jerona GAILS Sage, MD;  Location: [facility] OR;  Service: Orthopedics;  Laterality: Left;  Left Total Hip Arthroplasty, Removal of Deep Hardware   HIP SURGERY     Status post left hip surgery with bone grafting   IR INJECT/THERA/INC NEEDLE/CATH/PLC EPI/LUMB/SAC W/IMG  10/23/2018  LEFT HEART CATHETERIZATION WITH CORONARY ANGIOGRAM N/A 01/31/2014   Procedure: LEFT HEART CATHETERIZATION WITH CORONARY ANGIOGRAM;  Surgeon: Lonni JONETTA Cash, MD;  Location: [facility] CATH LAB;  Service: Cardiovascular;  Laterality: N/A;   LUMBAR LAMINECTOMY/DECOMPRESSION MICRODISCECTOMY Left 10/28/2018   Procedure: Left Lumbar three-four Extraforaminal microdiscectomy;  Surgeon: Alix Charleston, MD;  Location: [facility] OR;  Service: Neurosurgery;  Laterality: Left;   THORACOABDOMINAL AORTIC ANEURYSM REPAIR     with right femoral and left iliac BPG and reimplantation of renal arteries.   TONSILLECTOMY      TONSILLECTOMY     TOTAL HIP ARTHROPLASTY Left 05/26/2013   Procedure: TOTAL HIP ARTHROPLASTY;  Surgeon: Jerona LULLA Sage, MD;  Location: [facility] OR;  Service: Orthopedics;  Laterality: Left;  Left Total Hip Arthroplasty, Removal Deep Hardware    Allergies  Allergen Reactions   Glipizide  Other (See Comments)    dizziness   Jardiance  [Empagliflozin ] Other (See Comments)    Stomach upset    Prior to Admission medications   Medication Sig Start Date End Date Taking? Authorizing Provider  albuterol  (VENTOLIN  HFA) 108 (90 Base) MCG/ACT inhaler Inhale 2 puffs into the lungs every 4 (four) hours as needed for wheezing or shortness of breath. 11/05/22   Darlean Ozell NOVAK, MD  allopurinol  (ZYLOPRIM ) 100 MG tablet Take 2 tablets (200 mg total) by mouth daily. 07/08/23 08/02/24  Corwin Antu, FNP  aspirin  EC 81 MG tablet Take 1 tablet (81 mg total) by mouth daily. Swallow whole. 03/19/24   Awanda City, MD  Cholecalciferol  (VITAMIN D  PO) Take 1,000 Units by mouth daily.    [provider]  furosemide  (LASIX ) 20 MG tablet Take 2 tablets by mouth once daily 07/20/24   Dugal, Tabitha, FNP  glucose blood test strip Use as instructed 05/29/23   Corwin Antu, FNP  irbesartan  (AVAPRO ) 150 MG tablet Take 1 tablet (150 mg total) by mouth daily. 08/02/24   Corwin Antu, FNP  Lancet Device MISC 1 Lancet by Does not apply route daily as needed. 05/29/23   Dugal, Tabitha, FNP  meloxicam  (MOBIC ) 7.5 MG tablet Take 1 tablet by mouth once daily 12/01/23   Dugal, Tabitha, FNP  metoprolol  tartrate (LOPRESSOR ) 25 MG tablet Take 1 tablet (25 mg total) by mouth daily in the afternoon. 08/02/24   Corwin Antu, FNP  omeprazole  (PRILOSEC) 20 MG capsule Take 1 capsule (20 mg total) by mouth daily. 05/10/24   Corwin Antu, FNP  OXYGEN  Inhale 2 L into the lungs continuous.     [provider]  potassium chloride  SA (KLOR-CON  M) 20 MEQ tablet Take 1 tablet (20 mEq total) by mouth 2 (two) times daily for 7 days. 08/03/24  08/10/24  Dugal, Tabitha, FNP  pravastatin  (PRAVACHOL ) 40 MG tablet Take 1 tablet by mouth once daily 07/19/24   Dugal, Tabitha, FNP  spironolactone  (ALDACTONE ) 25 MG tablet Take 0.5 tablets (12.5 mg total) by mouth daily. 06/04/24 09/02/24  [place] LABOR, FNP  tamsulosin  (FLOMAX ) 0.4 MG CAPS capsule Take 1 capsule (0.4 mg total) by mouth at bedtime. 08/02/24   Corwin Antu, FNP    Social History   Socioeconomic History   Marital status: Married    Spouse name: [name]   Number of children: 4   Years of education: GED, ITT   Highest education level: Associate degree: occupational, scientist, product/process development, or vocational program  Occupational History   Occupation: architectural drawing-retired  Tobacco Use   Smoking status: Former    Current packs/day: 0.00    Average packs/day: 1 pack/day  for 35.0 years (35.0 ttl pk-yrs)    Types: Cigarettes    Start date: 09/17/1971    Quit date: 09/16/2006    Years since quitting: 17.8   Smokeless tobacco: Never  Vaping Use   Vaping status: Never Used  Substance and Sexual Activity   Alcohol use: No    Alcohol/week: 0.0 standard drinks of alcohol   Drug use: No   Sexual activity: Not Currently  Other Topics Concern   Not on file  Social History Narrative   Lives with wife   Right Handed   Drinks 4-5 cups caffeine daily   Social Drivers of Health   Financial Resource Strain: Low Risk  (05/19/2024)   Overall Financial Resource Strain (CARDIA)    Difficulty of Paying Living Expenses: Not hard at all  Food Insecurity: No Food Insecurity (05/19/2024)   Hunger Vital Sign    Worried About Running Out of Food in the Last Year: Never true    Ran Out of Food in the Last Year: Never true  Transportation Needs: No Transportation Needs (05/19/2024)   PRAPARE - Administrator, Civil Service (Medical): No    Lack of Transportation (Non-Medical): No  Physical Activity: Inactive (05/19/2024)   Exercise Vital Sign    Days of Exercise per Week: 0 days     Minutes of Exercise per Session: 0 min  Stress: No Stress Concern Present (05/19/2024)   [employer] of Occupational Health - Occupational Stress Questionnaire    Feeling of Stress: Not at all  Social Connections: Moderately Integrated (05/19/2024)   Social Connection and Isolation Panel    Frequency of Communication with Friends and Family: More than three times a week    Frequency of Social Gatherings with Friends and Family: Twice a week    Attends Religious Services: Never    Database Administrator or Organizations: Yes    Attends Banker Meetings: Never    Marital Status: Married  Catering Manager Violence: Not At Risk (05/19/2024)   Humiliation, Afraid, Rape, and Kick questionnaire    Fear of Current or Ex-Partner: No    Emotionally Abused: No    Physically Abused: No    Sexually Abused: No     Family History  Problem Relation Age of Onset   Osteoarthritis Mother    Diabetes Mother    Pulmonary embolism Mother    Heart disease Father        Coronary Artery Disease   Stroke Father    Multiple sclerosis Sister    Factor V Leiden deficiency Sister    Emphysema Sister    Hyperlipidemia Brother    Heart attack Maternal Grandmother    Lung cancer Maternal Grandfather        smoked    ROS: Otherwise negative unless mentioned in HPI  Physical Examination  Vitals:   08/04/24 1223  BP: 111/67  Pulse: 85  Resp: 18  Temp: 97.6 F (36.4 C)  SpO2: 98%   Body mass index is 40.63 kg/m.  General:  morbidly obese in NAD; on 2L  Gait: Not observed, in wheel chair HENT: WNL, normocephalic Pulmonary: normal non-labored breathing Cardiac: regular Abdomen: obese, soft, non distended, no tenderness to palpation Vascular Exam/Pulses: edematous lower extremities, unable to palpate pedal pulses, feet warm and well perfused Musculoskeletal: no muscle wasting or atrophy  Neurologic: A&O X 3;  No focal weakness or paresthesias are detected; speech is  fluent/normal Psychiatric:  The pt has Normal affect.  CBC  Component Value Date/Time   WBC 15.2 (H) 08/04/2024 1244   RBC 4.68 08/04/2024 1244   HGB 13.3 08/04/2024 1244   HCT 40.8 08/04/2024 1244   PLT 338 08/04/2024 1244   MCV 87.2 08/04/2024 1244   MCH 28.4 08/04/2024 1244   MCHC 32.6 08/04/2024 1244   RDW 13.9 08/04/2024 1244   LYMPHSABS 2.2 08/04/2024 1244   MONOABS 1.0 08/04/2024 1244   EOSABS 0.0 08/04/2024 1244   BASOSABS 0.1 08/04/2024 1244    BMET    Component Value Date/Time   NA 138 08/04/2024 1244   NA 140 06/15/2024 0927   K 3.5 08/04/2024 1244   CL 94 (L) 08/04/2024 1244   CO2 26 08/04/2024 1244   GLUCOSE 194 (H) 08/04/2024 1244   BUN 16 08/04/2024 1244   BUN 22 06/15/2024 0927   CREATININE 1.72 (H) 08/04/2024 1244   CREATININE 1.16 01/13/2015 0936   CALCIUM  9.4 08/04/2024 1244   GFRNONAA 41 (L) 08/04/2024 1244   GFRNONAA 53 (L) 07/19/2014 1156   GFRAA >60 06/11/2020 0754   GFRAA 61 07/19/2014 1156    COAGS: Lab Results  Component Value Date   INR 1.1 06/10/2020   INR 1.07 01/28/2014   INR 1.00 05/20/2013     Non-Invasive Vascular Imaging:   CT Abdomen pelvis with contrast: IMPRESSION: 1. Aortobifemoral bypass graft with tethering of loops of small bowel, tip of the appendix, and the distal right ureter to the right iliac limb of the bypass graft consistent with adhesions. 2. Inflammatory changes and circumferential thickening of the right iliac limb of the bypass graft as well as thickening of the appendix. Findings favored to represent an inflammatory/infectious process involving the right iliac limb of the bypass graft with associated reactive inflammation/thickening of the appendix and less likely an acute appendicitis and resulting inflammation of the right iliac limb of the bypass graft. Clinical correlation and vascular surgery consult is advised. 3. Mild right hydronephrosis likely secondary to adhesion of the distal right  ureter to the right iliac limb of the bypass graft. 4. Small nonobstructing bilateral renal calculi. 5. Fatty liver. 6.  Aortic Atherosclerosis (ICD10-I70.0).  Statin:  Yes.   Beta Blocker:  Yes.   Aspirin :  Yes.   ACEI:  No. ARB:  Yes.   CCB use:  No Other antiplatelets/anticoagulants:  No.    ASSESSMENT/PLAN: This is a 73 y.o. male past medical history significant for COPD on 2L [place], CAD, Type II DM, OSA, Morbid obesity, HTN, HLD, GERD, carotid stenosis and thoracoabdominal aneurysm presenting at recommendation of his PCP to the [facility] ER due to abdominal pain after having a CT scan at [facility]. He explains he has had right lower quadrant 7/10 pain that started about 1 week ago. The pain comes and goes. On exam he does not report any current pain. He is not tender to palpation in his abdomen. His labs are significant for leukocytosis of 15 K. He is afebrile. He has history of left thoracotomy for aorta-right femoral to left external iliac artery bypass with implantation of two right renal arteries and one left renal artery on 03/15/2003 by Dr. Eliza and Dr. Dyane. He underwent CTA evaluation yesterday showing some evidence of inflammatory changes around the right limb of his bypass graft as well as thickening of his appendix concerning for infection. Recommendation is for Hospitalist admission, IV antibiotics, blood cultures and also recommend consult by General surgery to r/o acute appendicitis vs other infectious process. There are no indications for any  emergent surgical intervention at this time. We will plan to repeat his CT imaging in the coming days to re evaluate.   Teretha Damme PA-C [facility] [phone] 08/04/2024  5:34 PM  I have seen and evaluated the patient. I agree with the PA note as documented above.  73 year old male previously had a type IV thoracoabdominal aortic aneurysm repair in 2004 with Dr. Eliza and this required aorto to right femoral and left  external iliac artery bypass graft with implantation of 2 right renal arteries and 1 left renal artery for an 8 cm type IV thoracoabdominal aneurysm.  Patient has been having 1 week of abdominal pain.  He got a CT scan with his PCP showing inflammation around the right limb of the graft including some tethered small bowel and concern for appendix inflammation.  On exam he is not toxic.  He denies any abdominal pain on my evaluation and states it comes and goes.  Has been doing very well prior to 1 week ago.  White count is 15,000.  Denies any fevers at home or failure to thrive picture that would suggest indolent graft infection.  Agree with blood cultures and broad-spectrum antibiotics.  Appreciate general surgery evaluation.  Discussed with family underlying concern that the graft could be infected which would be a highly morbid problem especially given his baseline functional status.  Will treat with antibiotics first and see how he progresses and then likely repeat imaging in 48 to 72 hours.  Agree with broad-spectrum antibiotics like Vanc Zosyn  pending blood cultures.  Lonni DOROTHA Gaskins, MD [facility] [place] Office: [phone]

## 2024-08-04 NOTE — ED Triage Notes (Addendum)
 Pt c/o intermittent RLQ pain and nausea x1 week.  Pt reports eating makes pain worse.  Pt reports recent CT scan and directed to ED by PCP.   Results show inflammation of Iliac limb of bypass graft and possible appendicitis.

## 2024-08-04 NOTE — Progress Notes (Signed)
 Pharmacy Antibiotic Note  Alfred Carney is a 73 y.o. male admitted on 08/04/2024 with abdominal infection.  Pharmacy has been consulted for Zosyn  dosing.  Plan: Zosyn  3.375g IV q8h (4 hour infusion).  Height: 5' 9 (175.3 cm) Weight: 124.8 kg (275 lb 2.2 oz) IBW/kg (Calculated) : 70.7  Temp (24hrs), Avg:97.6 F (36.4 C), Min:97.6 F (36.4 C), Max:97.6 F (36.4 C)  Recent Labs  Lab 08/02/24 1230 08/04/24 1244  WBC 14.3* 15.2*  CREATININE 1.47 1.72*    Estimated Creatinine Clearance: 49.2 mL/min (A) (by C-G formula based on SCr of 1.72 mg/dL (H)).    Allergies  Allergen Reactions   Glipizide  Other (See Comments)    dizziness   Jardiance  [Empagliflozin ] Other (See Comments)    Stomach upset    Antimicrobials this admission: Vancomycin  11/19 x 1  Zosyn    Microbiology results: 11/19 BCx:   Thank you for allowing pharmacy to be a part of this patient's care.  Marilouise Densmore M Jerritt Cardoza 08/04/2024 5:19 PM

## 2024-08-05 DIAGNOSIS — N1831 Chronic kidney disease, stage 3a: Secondary | ICD-10-CM

## 2024-08-05 DIAGNOSIS — Z9889 Other specified postprocedural states: Secondary | ICD-10-CM

## 2024-08-05 DIAGNOSIS — J449 Chronic obstructive pulmonary disease, unspecified: Secondary | ICD-10-CM

## 2024-08-05 DIAGNOSIS — B999 Unspecified infectious disease: Secondary | ICD-10-CM | POA: Diagnosis not present

## 2024-08-05 DIAGNOSIS — E1165 Type 2 diabetes mellitus with hyperglycemia: Secondary | ICD-10-CM

## 2024-08-05 LAB — COMPREHENSIVE METABOLIC PANEL WITH GFR
ALT: 11 U/L (ref 0–44)
AST: 15 U/L (ref 15–41)
Albumin: 2.9 g/dL — ABNORMAL LOW (ref 3.5–5.0)
Alkaline Phosphatase: 70 U/L (ref 38–126)
Anion gap: 14 (ref 5–15)
BUN: 16 mg/dL (ref 8–23)
CO2: 27 mmol/L (ref 22–32)
Calcium: 8.9 mg/dL (ref 8.9–10.3)
Chloride: 98 mmol/L (ref 98–111)
Creatinine, Ser: 1.73 mg/dL — ABNORMAL HIGH (ref 0.61–1.24)
GFR, Estimated: 41 mL/min — ABNORMAL LOW (ref >60)
Glucose, Bld: 155 mg/dL — ABNORMAL HIGH (ref 70–99)
Potassium: 3.1 mmol/L — ABNORMAL LOW (ref 3.5–5.1)
Sodium: 139 mmol/L (ref 135–145)
Total Bilirubin: 1.1 mg/dL (ref 0.0–1.2)
Total Protein: 6.6 g/dL (ref 6.5–8.1)

## 2024-08-05 LAB — CBC
HCT: 42.6 % (ref 39.0–52.0)
Hemoglobin: 13.7 g/dL (ref 13.0–17.0)
MCH: 28.1 pg (ref 26.0–34.0)
MCHC: 32.2 g/dL (ref 30.0–36.0)
MCV: 87.5 fL (ref 80.0–100.0)
Platelets: 316 K/uL (ref 150–400)
RBC: 4.87 MIL/uL (ref 4.22–5.81)
RDW: 13.7 % (ref 11.5–15.5)
WBC: 13.1 K/uL — ABNORMAL HIGH (ref 4.0–10.5)
nRBC: 0 % (ref 0.0–0.2)

## 2024-08-05 LAB — GLUCOSE, CAPILLARY
Glucose-Capillary: 163 mg/dL — ABNORMAL HIGH (ref 70–99)
Glucose-Capillary: 159 mg/dL — ABNORMAL HIGH (ref 70–99)
Glucose-Capillary: 187 mg/dL — ABNORMAL HIGH (ref 70–99)
Glucose-Capillary: 137 mg/dL — ABNORMAL HIGH (ref 70–99)

## 2024-08-05 MED ORDER — TAMSULOSIN HCL 0.4 MG PO CAPS
0.4000 mg | ORAL_CAPSULE | Freq: Every day | ORAL | Status: DC
  Administered 2024-08-05 – 2024-08-11 (×8): 0.4 mg via ORAL
  Filled 2024-08-05 (×8): qty 1

## 2024-08-05 MED ORDER — ALLOPURINOL 100 MG PO TABS
200.0000 mg | ORAL_TABLET | Freq: Every day | ORAL | Status: DC
  Administered 2024-08-05 – 2024-08-12 (×8): 200 mg via ORAL
  Filled 2024-08-05 (×8): qty 2

## 2024-08-05 MED ORDER — METOPROLOL TARTRATE 25 MG PO TABS
25.0000 mg | ORAL_TABLET | Freq: Every day | ORAL | Status: DC
  Administered 2024-08-05 – 2024-08-11 (×6): 25 mg via ORAL
  Filled 2024-08-05 (×7): qty 1

## 2024-08-05 MED ORDER — FUROSEMIDE 40 MG PO TABS
40.0000 mg | ORAL_TABLET | Freq: Every day | ORAL | Status: DC
  Administered 2024-08-05 – 2024-08-09 (×5): 40 mg via ORAL
  Filled 2024-08-05 (×5): qty 1

## 2024-08-05 MED ORDER — IPRATROPIUM-ALBUTEROL 0.5-2.5 (3) MG/3ML IN SOLN
3.0000 mL | Freq: Four times a day (QID) | RESPIRATORY_TRACT | Status: DC | PRN
Start: 2024-08-05 — End: 2024-08-12

## 2024-08-05 MED ORDER — POTASSIUM CHLORIDE CRYS ER 20 MEQ PO TBCR
40.0000 meq | EXTENDED_RELEASE_TABLET | Freq: Two times a day (BID) | ORAL | Status: AC
  Administered 2024-08-05 (×2): 40 meq via ORAL
  Filled 2024-08-05 (×2): qty 2

## 2024-08-05 MED ORDER — PANTOPRAZOLE SODIUM 40 MG PO TBEC
40.0000 mg | DELAYED_RELEASE_TABLET | Freq: Every day | ORAL | Status: DC
  Administered 2024-08-05 – 2024-08-12 (×8): 40 mg via ORAL
  Filled 2024-08-05 (×8): qty 1

## 2024-08-05 MED ORDER — PRAVASTATIN SODIUM 40 MG PO TABS
40.0000 mg | ORAL_TABLET | Freq: Every day | ORAL | Status: DC
  Administered 2024-08-05 – 2024-08-11 (×7): 40 mg via ORAL
  Filled 2024-08-05 (×7): qty 1

## 2024-08-05 MED ORDER — IRBESARTAN 300 MG PO TABS
150.0000 mg | ORAL_TABLET | Freq: Every day | ORAL | Status: DC
  Administered 2024-08-05: 150 mg via ORAL
  Filled 2024-08-05: qty 1

## 2024-08-05 MED ORDER — SPIRONOLACTONE 12.5 MG HALF TABLET
12.5000 mg | ORAL_TABLET | Freq: Every day | ORAL | Status: DC
  Administered 2024-08-05 – 2024-08-07 (×3): 12.5 mg via ORAL
  Filled 2024-08-05 (×4): qty 1

## 2024-08-05 NOTE — Progress Notes (Signed)
 Pt reports his legs giving out under him sometimes for the past few months, and has plans to see an MD  X1 with walker to the bathroom, tolerated well, but required 2L of o2 otherwise severely desats - is on baseline 2L o2 w/ nasal cannula at home  Pt has hx of back surgery, PRN pain regiment per [facility] sufficient for pt's back pain

## 2024-08-05 NOTE — Telephone Encounter (Signed)
 When called was received, it was after office hours. As of this morning, pt has been admitted.

## 2024-08-05 NOTE — Progress Notes (Addendum)
 Progress Note    08/05/2024 7:12 AM Hospital Day 1  Subjective:  sitting in chair.  Says he had some right sided abdominal pain overnight.  He did eat breakfast this morning and states he ate more today than he has over the past week.  Denies any nausea/vomiting.  When asked about blood in his stool, he states he has not had a BM.  Afebrile  Abx:  Vanc/Zosyn   Vitals:   08/04/24 2321 08/05/24 0300  BP: (!) 160/58 (!) 143/55  Pulse:  75  Resp: 20 (!) 22  Temp: 98.4 F (36.9 C) 98.6 F (37 C)  SpO2: 100% 100%    Physical Exam: General:  no distress sitting in chair Lungs:  non labored abdomen:  soft, non tender to palpation.    CBC    Component Value Date/Time   WBC 13.1 (H) 08/05/2024 0407   RBC 4.87 08/05/2024 0407   HGB 13.7 08/05/2024 0407   HCT 42.6 08/05/2024 0407   PLT 316 08/05/2024 0407   MCV 87.5 08/05/2024 0407   MCH 28.1 08/05/2024 0407   MCHC 32.2 08/05/2024 0407   RDW 13.7 08/05/2024 0407   LYMPHSABS 2.2 08/04/2024 1244   MONOABS 1.0 08/04/2024 1244   EOSABS 0.0 08/04/2024 1244   BASOSABS 0.1 08/04/2024 1244    BMET    Component Value Date/Time   NA 139 08/05/2024 0407   NA 140 06/15/2024 0927   K 3.1 (L) 08/05/2024 0407   CL 98 08/05/2024 0407   CO2 27 08/05/2024 0407   GLUCOSE 155 (H) 08/05/2024 0407   BUN 16 08/05/2024 0407   BUN 22 06/15/2024 0927   CREATININE 1.73 (H) 08/05/2024 0407   CREATININE 1.16 01/13/2015 0936   CALCIUM  8.9 08/05/2024 0407   GFRNONAA 41 (L) 08/05/2024 0407   GFRNONAA 53 (L) 07/19/2014 1156   GFRAA >60 06/11/2020 0754   GFRAA 61 07/19/2014 1156    INR    Component Value Date/Time   INR 1.1 06/10/2020 0048     Intake/Output Summary (Last 24 hours) at 08/05/2024 9287 Last data filed at 08/05/2024 0400 Gross per 24 hour  Intake 50 ml  Output --  Net 50 ml     Assessment/Plan:  73 y.o. male with hx of TAA repair 2004 now with abdominal pain with concern of inflammatory changes around the right  limb of his bpg and possible appendix infection. Hospital Day 1  -he did have some right sided abdominal pain overnight.  Ate breakfast this morning without difficulty and did not have any N/V.  No BM.  -blood cultures in process.  Leukocytosis improved from yesterday to 13.1k from 15.2k.  remains afebrile.  On vanc and zosyn . -plan for re-scan most likely on Saturday.  CKD with creatinine at 1.73-will need IV hydration for repeat scan on Saturday.  -pt states he does not want the surgery that was discussed with him.    Lucie Apt, PA-C [facility] [phone] 08/05/2024 7:12 AM   I have seen and evaluated the patient. I agree with the PA note as documented above.  No abdominal pain on my evaluation.  Resting comfortably in his chair.  White count down to 13.  No growth on blood cultures.  Rescan most likely on Saturday with short interval imaging.  Continue IV vanc and zosyn .  Again following inflammatory changes of small bowel appendix around the right limb of his prior type IV thoracoabdominal repair.  Lonni DOROTHA Gaskins, MD [facility]  [facility] [place] Office: [phone]

## 2024-08-05 NOTE — TOC CM/SW Note (Signed)
 Transition of Care [facility]) - Inpatient Brief Assessment   Patient Details  Name: Alfred Carney MRN: [id number] Date of Birth: 03-15-1951  Transition of [facility]) CM/SW Contact:    Lauraine FORBES Saa, LCSWA Phone Number: 08/05/2024, 9:14 AM   Clinical Narrative:  9:15 AM Per chart review, patient resides at home with spouse. Patient has a PCP and insurance. Patient has SNF history with [facility]. Patient has HH history with [facility], [facility], and [facility].Patient has DME (manual wheelchair, RW, rollator, oxygen , BSC) history with [facility]. Patient's preferred pharmacy is [facility] [address]. No TOC needs identified at this time. TOC will continue to follow.  Transition of Care Asessment: Insurance and Status: Insurance coverage has been reviewed Patient has primary care physician: Yes Home environment has been reviewed: Private Residence Prior level of function:: N/A Prior/Current Home Services: No current home services Social Drivers of Health Review: SDOH reviewed no interventions necessary Readmission risk has been reviewed: Yes (Currently Yellow 20%) Transition of care needs: no transition of care needs at this time

## 2024-08-05 NOTE — Plan of Care (Signed)
  Problem: Education: Goal: Knowledge of General Education information will improve Description: Including pain rating scale, medication(s)/side effects and non-pharmacologic comfort measures Outcome: Progressing   Problem: Health Behavior/Discharge Planning: Goal: Ability to manage health-related needs will improve Outcome: Progressing   Problem: Clinical Measurements: Goal: Respiratory complications will improve Outcome: Progressing   Problem: Activity: Goal: Risk for activity intolerance will decrease Outcome: Progressing   Problem: Pain Managment: Goal: General experience of comfort will improve and/or be controlled Outcome: Progressing

## 2024-08-05 NOTE — Plan of Care (Signed)
  Problem: Coping: Goal: Ability to adjust to condition or change in health will improve Outcome: Progressing   Problem: Fluid Volume: Goal: Ability to maintain a balanced intake and output will improve Outcome: Progressing   Problem: Metabolic: Goal: Ability to maintain appropriate glucose levels will improve Outcome: Progressing   Problem: Elimination: Goal: Will not experience complications related to bowel motility Outcome: Progressing   Problem: Pain Managment: Goal: General experience of comfort will improve and/or be controlled Outcome: Progressing   Problem: Safety: Goal: Ability to remain free from injury will improve Outcome: Progressing   Problem: Skin Integrity: Goal: Risk for impaired skin integrity will decrease Outcome: Progressing   Problem: Education: Goal: Ability to describe self-care measures that may prevent or decrease complications (Diabetes Survival Skills Education) will improve Outcome: Not Progressing   Problem: Nutritional: Goal: Maintenance of adequate nutrition will improve Outcome: Not Progressing

## 2024-08-05 NOTE — Progress Notes (Signed)
 PROGRESS NOTE    Alfred Carney  FMW:[id number] DOB: July 11, 1951 DOA: 08/04/2024 PCP: Corwin Antu, FNP  Chronically ill 73/M with history of COPD, chronic hypoxic respiratory failure on 2 L [place], chronic diastolic HF, HTN, HLD, CKD 3A, class III obesity, T2DM, OSA, GERD, PAD s/p bypass, CAD, carotid stenosis and thoracoabdominal aneurysm s/p repair who presented to the ED for evaluation of abdominal pain. Patient reports 4 to 5 days of nagging, intermittent right lower quadrant pain.  He was seen by his PCP on 11/17 for evaluation and a CT abdomen pelvis with contrast was obtained 11/18 for further evaluation. Results showed evidence of inflammatory changes around the right limb of his bypass graft as well as thickening of his appendix concerning for infection. Patient was called to present to the ED, labs noted WBC of 15, creatinine 1.7, started on broad-spectrum antibiotics, seen by vascular surgery and general surgery in consultation   Subjective: -Feels fair, mild right lower quadrant discomfort  Assessment and Plan:  Intra-abdominal infection, suspect appendicitis - Patient presented with 1 week of intermittent RLQ pain with associated nausea - CT abdomen pelvis from PCP office shows some inflammation around the right limb of the graft including some tethered small bowel, tip of the appendix, distal right ureter and concern for appendix inflammation  - General Surgery and vascular surgery following, continue IV Zosyn , downgrade diet to liquids for now -Felt to be a poor surgical candidate, recommended to continue broad-spectrum antibiotics for now with plan for repeat imaging in 2 to 3 days - Follow-up blood cultures, trend CBC   PAD s/p bypass Hx of thoracoabdominal aneurysm - Vascular surgery following, appreciate recs - Continue pravastatin    T2DM with hyperglycemia - Last A1c 7.4% 1 month ago, blood sugar 194 on admission - SSI with meals, CBG monitoring   AKI on CKD 3A - Slight  rise in creatinine to 1.72 from baseline of 1.2-1.5 - Monitor, hold ARB   HTN - Stable - Continue spironolactone , metoprolol , hold ARB   Chronic diastolic heart failure Chronic lower extremity swelling -Continue Lasix , metoprolol , and spironolactone    COPD/Chronic hypoxic respiratory failure - Chronic and stable, no signs of COPD exacerbation - Remains on baseline 2 L [place], continue - As needed DuoNebs   Hx of PSVT - Continue metoprolol    # HLD # CAD - Continue pravastatin    # Gout - Continue allopurinol    # BPH - Continue tamsulosin    # OSA - Not on CPAP   # Class III obesity Body mass index is 40.63 kg/m.   DVT prophylaxis: Lovenox  Code Status: DNR Family Communication: Discussed with patient detail, no family at bedside Disposition Plan:   Consultants:    Procedures:   Antimicrobials:    Objective: Vitals:   08/04/24 2001 08/04/24 2321 08/05/24 0300 08/05/24 0742  BP: (!) 130/56 (!) 160/58 (!) 143/55 102/62  Pulse: 94  75 99  Resp: (!) 22 20 (!) 22 16  Temp: 97.9 F (36.6 C) 98.4 F (36.9 C) 98.6 F (37 C) 98.9 F (37.2 C)  TempSrc: Oral Oral Oral Oral  SpO2: 98% 100% 100% 100%  Weight:      Height:        Intake/Output Summary (Last 24 hours) at 08/05/2024 1013 Last data filed at 08/05/2024 0900 Gross per 24 hour  Intake 167 ml  Output 0 ml  Net 167 ml   Filed Weights   08/04/24 1238  Weight: 124.8 kg    Examination:  General exam: Obese chronically  ill, AAO x 3 Respiratory system: decreased breath sounds at the bases Cardiovascular system: S1 & S2 heard, RRR.  Abd: Soft, mild right lower quadrant tenderness, bowel sounds present Central nervous system: Alert and oriented. No focal neurological deficits. Extremities: no edema Skin: No rashes Psychiatry:  Mood & affect appropriate.     Data Reviewed:   CBC: Recent Labs  Lab 08/02/24 1230 08/04/24 1244 08/05/24 0407  WBC 14.3* 15.2* 13.1*  NEUTROABS 10.8* 11.8*  --    HGB 13.1 13.3 13.7  HCT 39.7 40.8 42.6  MCV 85.6 87.2 87.5  PLT 370.0 338 316   Basic Metabolic Panel: Recent Labs  Lab 08/02/24 1230 08/04/24 1244 08/05/24 0407  NA 141 138 139  K 3.1* 3.5 3.1*  CL 95* 94* 98  CO2 31 26 27   GLUCOSE 170* 194* 155*  BUN 18 16 16   CREATININE 1.47 1.72* 1.73*  CALCIUM  9.4 9.4 8.9   GFR: Estimated Creatinine Clearance: 48.9 mL/min (A) (by C-G formula based on SCr of 1.73 mg/dL (H)). Liver Function Tests: Recent Labs  Lab 08/02/24 1230 08/04/24 1244 08/05/24 0407  AST 10 16 15   ALT 10 13 11   ALKPHOS 68 69 70  BILITOT 0.9 0.9 1.1  PROT 7.0 7.2 6.6  ALBUMIN  3.7 3.0* 2.9*   Recent Labs  Lab 08/02/24 1230  LIPASE 45.0  AMYLASE 35   No results for input(s): AMMONIA in the last 168 hours. Coagulation Profile: No results for input(s): INR, PROTIME in the last 168 hours. Cardiac Enzymes: No results for input(s): CKTOTAL, CKMB, CKMBINDEX, TROPONINI in the last 168 hours. BNP (last 3 results) Recent Labs    05/10/24 1224  PROBNP 89.0   HbA1C: No results for input(s): HGBA1C in the last 72 hours. CBG: Recent Labs  Lab 08/04/24 1420 08/04/24 2126 08/05/24 0737  GLUCAP 202* 174* 163*   Lipid Profile: No results for input(s): CHOL, HDL, LDLCALC, TRIG, CHOLHDL, LDLDIRECT in the last 72 hours. Thyroid  Function Tests: No results for input(s): TSH, T4TOTAL, FREET4, T3FREE, THYROIDAB in the last 72 hours. Anemia Panel: No results for input(s): VITAMINB12, FOLATE, FERRITIN, TIBC, IRON, RETICCTPCT in the last 72 hours. Urine analysis:    Component Value Date/Time   COLORURINE YELLOW 08/04/2024 1317   APPEARANCEUR CLEAR 08/04/2024 1317   LABSPEC 1.012 08/04/2024 1317   PHURINE 6.0 08/04/2024 1317   GLUCOSEU NEGATIVE 08/04/2024 1317   HGBUR NEGATIVE 08/04/2024 1317   BILIRUBINUR NEGATIVE 08/04/2024 1317   BILIRUBINUR negative 08/02/2024 1234   KETONESUR NEGATIVE 08/04/2024 1317    PROTEINUR NEGATIVE 08/04/2024 1317   UROBILINOGEN 0.2 08/02/2024 1234   UROBILINOGEN 1.0 05/05/2014 1706   NITRITE NEGATIVE 08/04/2024 1317   LEUKOCYTESUR NEGATIVE 08/04/2024 1317   Sepsis Labs: @LABRCNTIP (procalcitonin:4,lacticidven:4)  ) Recent Results (from the past 240 hours)  Blood culture (routine x 2)     Status: None (Preliminary result)   Collection Time: 08/04/24  5:22 PM   Specimen: BLOOD  Result Value Ref Range Status   Specimen Description BLOOD LEFT ANTECUBITAL  Final   Special Requests   Final    BOTTLES DRAWN AEROBIC ONLY Blood Culture results may not be optimal due to an inadequate volume of blood received in culture bottles   Culture   Final    NO GROWTH < 24 HOURS Performed at [facility], [address], [place], [place] [address]    Report Status PENDING  Incomplete  Blood culture (routine x 2)     Status: None (Preliminary result)  Collection Time: 08/04/24  9:05 PM   Specimen: BLOOD  Result Value Ref Range Status   Specimen Description BLOOD SITE NOT SPECIFIED  Final   Special Requests   Final    BOTTLES DRAWN AEROBIC ONLY Blood Culture adequate volume   Culture   Final    NO GROWTH < 12 HOURS Performed at [facility], [address] [place], [place] [address]    Report Status PENDING  Incomplete     Radiology Studies: CT ABDOMEN PELVIS W CONTRAST Result Date: 08/03/2024 CLINICAL DATA:  Abdominal pain.  Hernia suspected. EXAM: CT ABDOMEN AND PELVIS WITH CONTRAST TECHNIQUE: Multidetector CT imaging of the abdomen and pelvis was performed using the standard protocol following bolus administration of intravenous contrast. RADIATION DOSE REDUCTION: This exam was performed according to the departmental dose-optimization program which includes automated exposure control, adjustment of the mA and/or kV according to patient size and/or use of iterative reconstruction technique. CONTRAST:  OMNIPAQUE  IOHEXOL  300 MG/ML  SOLN COMPARISON:  CT  dated 01/28/2018. chest CT dated 03/26/2021. FINDINGS: Lower chest: Partially visualized calcified nodule at the left lung base measures 2 cm, increased in size since 2022. This was previously suggested to be a benign lesion. There is coronary vascular calcification. There is left is minus changes of the lung bases. No intra-abdominal free air or free fluid. Hepatobiliary: Fatty appearing liver. No biliary dilatation. Cholecystectomy. Pancreas: Unremarkable. No pancreatic ductal dilatation or surrounding inflammatory changes. Spleen: Normal in size without focal abnormality. Adrenals/Urinary Tract: The adrenal glands unremarkable. There is mild bilateral renal parenchyma atrophy. Small nonobstructing bilateral renal calculi measure up to 4 mm in the inferior pole of the left kidney. No obstructing stone. No hydronephrosis on the left. There is mild right hydronephrosis likely secondary to decreased motility of the right ureter due to inflammatory changes of the aorta versus possible adhesion of the distal right ureter to the right common iliac artery bypass graph (60/2) and resulting stricture. The urinary bladder is collapsed. Stomach/Bowel: Small hiatal hernia. Mild sigmoid diverticulosis. There is no bowel obstruction. The appendix appears to extend from the anterior right lower quadrant posterior medially with the tip of the appendix abutting the right common iliac artery bypass graft in similar location as the prior CT likely representing adhesion. There is inflammatory changes and thickening of the appendix and inflammatory changes and soft tissue thickening around the right common iliac limb of the bypass graft. Overall the epicenter of inflammatory process appears around the right iliac limb of the bypass graft rather than the appendix. Vascular/Lymphatic: Advanced aortoiliac atherosclerotic disease. Chronic narrowing and scarring of the distal native abdominal aorta and iliac arteries status post  aortobifemoral bypass graft. Diffuse circumferential thickening and inflammatory changes along the right iliac limb of the bypass graft. There is infiltration of the surrounding fat plane. Tethering of several small bowel loops and tip of the appendix as well as distal right ureter to the right iliac limb of the bypass graft consistent with adhesions. The graft remains patent. No portal venous gas. There is no adenopathy. Reproductive: The prostate is grossly unremarkable Other: None Musculoskeletal: Total left hip arthroplasty. Degenerative changes of the spine. No acute osseous pathology. IMPRESSION: 1. Aortobifemoral bypass graft with tethering of loops of small bowel, tip of the appendix, and the distal right ureter to the right iliac limb of the bypass graft consistent with adhesions. 2. Inflammatory changes and circumferential thickening of the right iliac limb of the bypass graft as well as thickening  of the appendix. Findings favored to represent an inflammatory/infectious process involving the right iliac limb of the bypass graft with associated reactive inflammation/thickening of the appendix and less likely an acute appendicitis and resulting inflammation of the right iliac limb of the bypass graft. Clinical correlation and vascular surgery consult is advised. 3. Mild right hydronephrosis likely secondary to adhesion of the distal right ureter to the right iliac limb of the bypass graft. 4. Small nonobstructing bilateral renal calculi. 5. Fatty liver. 6.  Aortic Atherosclerosis (ICD10-I70.0). Electronically Signed   By: Vanetta Chou M.D.   On: 08/03/2024 15:57     Scheduled Meds:  allopurinol   200 mg Oral Daily   enoxaparin  (LOVENOX ) injection  40 mg Subcutaneous Q24H   furosemide   40 mg Oral Daily   insulin  aspart  0-15 Units Subcutaneous TID WC   insulin  aspart  0-5 Units Subcutaneous QHS   irbesartan   150 mg Oral Daily   metoprolol  tartrate  25 mg Oral Q1500   pantoprazole   40 mg Oral  Daily   pravastatin   40 mg Oral q1800   spironolactone   12.5 mg Oral Daily   tamsulosin   0.4 mg Oral QHS   Continuous Infusions:  piperacillin -tazobactam (ZOSYN )  IV 3.375 g (08/05/24 0740)     LOS: 1 day    Time spent:    Sigurd Pac, MD [facility]   08/05/2024, 10:13 AM

## 2024-08-05 NOTE — Progress Notes (Addendum)
 Subjective: CC: Patient reports RLQ abdominal pain improved. No worsening abdominal pain. Last dose of PRN pain medication at 0551. He is on CLD but reports he had eggs for breakfast without worsening pain, n/v. Passing flatus. No BM.   Afebrile. HR < 100. Soft BP improved with last pressure 102/62. He is on scheduled beta-blocker. WBC 13.1 from 15.2.   Objective: Vital signs in last 24 hours: Temp:  [97.6 F (36.4 C)-98.9 F (37.2 C)] 98.9 F (37.2 C) (11/20 0742) Pulse Rate:  [75-99] 99 (11/20 0742) Resp:  [16-22] 16 (11/20 0742) BP: (90-160)/(55-78) 102/62 (11/20 0742) SpO2:  [97 %-100 %] 100 % (11/20 0742) Weight:  [124.8 kg] 124.8 kg (11/19 1238) Last BM Date : 08/05/24  Intake/Output from previous day: 11/19 0701 - 11/20 0700 In: 50 [IV Piggyback:50] Out: -  Intake/Output this shift: Total I/O In: 117 [P.O.:117] Out: 0   PE: Gen:  Alert, NAD, pleasant Pulm:  Rate and effort normal on o2 Abd: Soft, ND, mild RLQ ttp without rigidity or guarding. He reports stable, chronic left sided abdominal pain that he has had since his AAA repair. No other abdominal ttp.  Psych: A&Ox3   Lab Results:  Recent Labs    08/04/24 1244 08/05/24 0407  WBC 15.2* 13.1*  HGB 13.3 13.7  HCT 40.8 42.6  PLT 338 316   BMET Recent Labs    08/04/24 1244 08/05/24 0407  NA 138 139  K 3.5 3.1*  CL 94* 98  CO2 26 27  GLUCOSE 194* 155*  BUN 16 16  CREATININE 1.72* 1.73*  CALCIUM  9.4 8.9   PT/INR No results for input(s): LABPROT, INR in the last 72 hours. CMP     Component Value Date/Time   NA 139 08/05/2024 0407   NA 140 06/15/2024 0927   K 3.1 (L) 08/05/2024 0407   CL 98 08/05/2024 0407   CO2 27 08/05/2024 0407   GLUCOSE 155 (H) 08/05/2024 0407   BUN 16 08/05/2024 0407   BUN 22 06/15/2024 0927   CREATININE 1.73 (H) 08/05/2024 0407   CREATININE 1.16 01/13/2015 0936   CALCIUM  8.9 08/05/2024 0407   PROT 6.6 08/05/2024 0407   ALBUMIN  2.9 (L) 08/05/2024 0407    AST 15 08/05/2024 0407   ALT 11 08/05/2024 0407   ALKPHOS 70 08/05/2024 0407   BILITOT 1.1 08/05/2024 0407   GFRNONAA 41 (L) 08/05/2024 0407   GFRNONAA 53 (L) 07/19/2014 1156   GFRAA >60 06/11/2020 0754   GFRAA 61 07/19/2014 1156   Lipase     Component Value Date/Time   LIPASE 45.0 08/02/2024 1230    Studies/Results: CT ABDOMEN PELVIS W CONTRAST Result Date: 08/03/2024 CLINICAL DATA:  Abdominal pain.  Hernia suspected. EXAM: CT ABDOMEN AND PELVIS WITH CONTRAST TECHNIQUE: Multidetector CT imaging of the abdomen and pelvis was performed using the standard protocol following bolus administration of intravenous contrast. RADIATION DOSE REDUCTION: This exam was performed according to the departmental dose-optimization program which includes automated exposure control, adjustment of the mA and/or kV according to patient size and/or use of iterative reconstruction technique. CONTRAST:  OMNIPAQUE  IOHEXOL  300 MG/ML  SOLN COMPARISON:  CT dated 01/28/2018. chest CT dated 03/26/2021. FINDINGS: Lower chest: Partially visualized calcified nodule at the left lung base measures 2 cm, increased in size since 2022. This was previously suggested to be a benign lesion. There is coronary vascular calcification. There is left is minus changes of the lung bases. No intra-abdominal free air or  free fluid. Hepatobiliary: Fatty appearing liver. No biliary dilatation. Cholecystectomy. Pancreas: Unremarkable. No pancreatic ductal dilatation or surrounding inflammatory changes. Spleen: Normal in size without focal abnormality. Adrenals/Urinary Tract: The adrenal glands unremarkable. There is mild bilateral renal parenchyma atrophy. Small nonobstructing bilateral renal calculi measure up to 4 mm in the inferior pole of the left kidney. No obstructing stone. No hydronephrosis on the left. There is mild right hydronephrosis likely secondary to decreased motility of the right ureter due to inflammatory changes of the aorta  versus possible adhesion of the distal right ureter to the right common iliac artery bypass graph (60/2) and resulting stricture. The urinary bladder is collapsed. Stomach/Bowel: Small hiatal hernia. Mild sigmoid diverticulosis. There is no bowel obstruction. The appendix appears to extend from the anterior right lower quadrant posterior medially with the tip of the appendix abutting the right common iliac artery bypass graft in similar location as the prior CT likely representing adhesion. There is inflammatory changes and thickening of the appendix and inflammatory changes and soft tissue thickening around the right common iliac limb of the bypass graft. Overall the epicenter of inflammatory process appears around the right iliac limb of the bypass graft rather than the appendix. Vascular/Lymphatic: Advanced aortoiliac atherosclerotic disease. Chronic narrowing and scarring of the distal native abdominal aorta and iliac arteries status post aortobifemoral bypass graft. Diffuse circumferential thickening and inflammatory changes along the right iliac limb of the bypass graft. There is infiltration of the surrounding fat plane. Tethering of several small bowel loops and tip of the appendix as well as distal right ureter to the right iliac limb of the bypass graft consistent with adhesions. The graft remains patent. No portal venous gas. There is no adenopathy. Reproductive: The prostate is grossly unremarkable Other: None Musculoskeletal: Total left hip arthroplasty. Degenerative changes of the spine. No acute osseous pathology. IMPRESSION: 1. Aortobifemoral bypass graft with tethering of loops of small bowel, tip of the appendix, and the distal right ureter to the right iliac limb of the bypass graft consistent with adhesions. 2. Inflammatory changes and circumferential thickening of the right iliac limb of the bypass graft as well as thickening of the appendix. Findings favored to represent an  inflammatory/infectious process involving the right iliac limb of the bypass graft with associated reactive inflammation/thickening of the appendix and less likely an acute appendicitis and resulting inflammation of the right iliac limb of the bypass graft. Clinical correlation and vascular surgery consult is advised. 3. Mild right hydronephrosis likely secondary to adhesion of the distal right ureter to the right iliac limb of the bypass graft. 4. Small nonobstructing bilateral renal calculi. 5. Fatty liver. 6.  Aortic Atherosclerosis (ICD10-I70.0). Electronically Signed   By: Vanetta Chou M.D.   On: 08/03/2024 15:57    Anti-infectives: Anti-infectives (From admission, onward)    Start     Dose/Rate Route Frequency Ordered Stop   08/04/24 1730  vancomycin  (VANCOCIN ) 2,500 mg in sodium chloride  0.9 % 500 mL IVPB        2,500 mg 262.5 mL/hr over 120 Minutes Intravenous  Once 08/04/24 1717 08/04/24 2331   08/04/24 1730  piperacillin -tazobactam (ZOSYN ) IVPB 3.375 g        3.375 g 12.5 mL/hr over 240 Minutes Intravenous Every 8 hours 08/04/24 1718     08/04/24 1715  vancomycin  (VANCOCIN ) IVPB 1000 mg/200 mL premix  Status:  Discontinued        1,000 mg 200 mL/hr over 60 Minutes Intravenous  Once 08/04/24 1711 08/04/24 1717  Assessment/Plan Hillel Card is an 73 y.o. male with abdominal pain and concern for possible iliac limb/bypass graft infection with reactive inflammation of appendix versus primary appendicitis with resultant inflammation of iliac limb/graft.  - CT without obvious free air or free fluid. No obvious appendicolith. HDS. No peritonitis on exam. WBC 13.1 from 15.2 today. Would not recommend surgery at this time given above and CT showing tethering to his graft and involvement of ureter and several loops of small intestine making significant adhesions making the likelihood of needing a more extensive laparotomy with risk of injury to any and all of the structure more  likely. Would continue to observe with non-operative management on abx for now.  - Agree with broad spectrum antibiotics while awaiting blood cultures - Appreciate vascular input. They are tentatively planning repeat CT 11/22. - Okay for FLD. AM labs - We will follow with you closely  FEN - FLD. If patient begins to have worsening pain or concerning vitals would make NPO VTE: SCDs, Lovenox  ID: [email or web address]  (please note that Vascular recommended Vanc as well and he does not currently have an order for this).   I reviewed nursing notes, Consultant (vascular) notes, hospitalist notes, last 24 h vitals and pain scores, last 48 h intake and output, last 24 h labs and trends, and last 24 h imaging results.     LOS: 1 day    Ozell CHRISTELLA Shaper, [facility] Surgery 08/05/2024, 12:20 PM Please see Amion for pager number during day hours 7:00am-4:30pm

## 2024-08-06 DIAGNOSIS — B999 Unspecified infectious disease: Secondary | ICD-10-CM | POA: Diagnosis not present

## 2024-08-06 LAB — CBC
HCT: 36.8 % — ABNORMAL LOW (ref 39.0–52.0)
Hemoglobin: 11.8 g/dL — ABNORMAL LOW (ref 13.0–17.0)
MCH: 28.2 pg (ref 26.0–34.0)
MCHC: 32.1 g/dL (ref 30.0–36.0)
MCV: 87.8 fL (ref 80.0–100.0)
Platelets: 285 K/uL (ref 150–400)
RBC: 4.19 MIL/uL — ABNORMAL LOW (ref 4.22–5.81)
RDW: 13.8 % (ref 11.5–15.5)
WBC: 12.9 K/uL — ABNORMAL HIGH (ref 4.0–10.5)
nRBC: 0 % (ref 0.0–0.2)

## 2024-08-06 LAB — BASIC METABOLIC PANEL WITH GFR
Anion gap: 14 (ref 5–15)
BUN: 18 mg/dL (ref 8–23)
CO2: 25 mmol/L (ref 22–32)
Calcium: 8.7 mg/dL — ABNORMAL LOW (ref 8.9–10.3)
Chloride: 99 mmol/L (ref 98–111)
Creatinine, Ser: 1.88 mg/dL — ABNORMAL HIGH (ref 0.61–1.24)
GFR, Estimated: 37 mL/min — ABNORMAL LOW (ref >60)
Glucose, Bld: 147 mg/dL — ABNORMAL HIGH (ref 70–99)
Potassium: 4.2 mmol/L (ref 3.5–5.1)
Sodium: 138 mmol/L (ref 135–145)

## 2024-08-06 LAB — GLUCOSE, CAPILLARY
Glucose-Capillary: 145 mg/dL — ABNORMAL HIGH (ref 70–99)
Glucose-Capillary: 137 mg/dL — ABNORMAL HIGH (ref 70–99)
Glucose-Capillary: 131 mg/dL — ABNORMAL HIGH (ref 70–99)
Glucose-Capillary: 142 mg/dL — ABNORMAL HIGH (ref 70–99)

## 2024-08-06 MED ORDER — HYDROMORPHONE HCL 1 MG/ML IJ SOLN
0.5000 mg | Freq: Once | INTRAMUSCULAR | Status: AC
  Administered 2024-08-06: 0.5 mg via INTRAVENOUS
  Filled 2024-08-06: qty 0.5

## 2024-08-06 MED ORDER — VANCOMYCIN HCL IN DEXTROSE 1-5 GM/200ML-% IV SOLN
1000.0000 mg | INTRAVENOUS | Status: DC
  Administered 2024-08-06: 1000 mg via INTRAVENOUS
  Filled 2024-08-06 (×2): qty 200

## 2024-08-06 NOTE — Evaluation (Signed)
 Physical Therapy Evaluation Patient Details Name: Alfred Carney MRN: [id number] DOB: October 05, 1950 Today's Date: 08/06/2024  History of Present Illness  Pt is a 73 y.o. M who presents 08/04/2024 with abdominal pain and concern of inflammatory changes around the right limb of his bpg and possible appendix infection.  CT without obvious free air or free fluid. Not recommending surgery at this time; plan for conservative management on abx for now. Significant PMH includes: TAA repair 2044, NIDDM, PAD, HLD, HTN, history of gout, BPH, COPD (2L at baseline), HFpEF.  Clinical Impression  PTA, pt living alone with wife in single story home with 3 STE. States he typically mobilizes with w/c and occasionally either ambulates with RW or no AD. He describes several falls in the last six months, which he states were due to his legs giving out. Currently, pt is CGA for transfers and gait, and able to ambulate 2 bouts of 48' with chair follow for seated rest break in between. Did not note LOB or knee buckling during gait today. Pt did describe mild dizziness sitting in chair prior to activity, and vital signs were 108/51 (69), HR 78 BPM. Denies dizziness or lightheadedness in standing. Pt states he is giving up and is no longer interested in things he used to like. Pt largely uninterested in discussion regarding community engagement and senior services, and did not feel like he needed HHPT even though lengthy discussion was had regarding how it could improve his mobility. Will recommend HHPT upon d/c to improve safety and tolerance with mobility, but pt likely to decline. Acute PT to follow.       If plan is discharge home, recommend the following: A little help with walking and/or transfers;A little help with bathing/dressing/bathroom;Help with stairs or ramp for entrance   Can travel by private vehicle        Equipment Recommendations None recommended by PT  Recommendations for Other Services  OT consult     Functional Status Assessment Patient has had a recent decline in their functional status and demonstrates the ability to make significant improvements in function in a reasonable and predictable amount of time.     Precautions / Restrictions Precautions Precautions: Fall Recall of Precautions/Restrictions: Intact Restrictions Weight Bearing Restrictions Per Provider Order: No      Mobility  Bed Mobility               General bed mobility comments: Pt received in and returned to chair    Transfers Overall transfer level: Needs assistance Equipment used: Rolling walker (2 wheels) Transfers: Sit to/from Stand Sit to Stand: Contact guard assist           General transfer comment: CGA for safety    Ambulation/Gait Ambulation/Gait assistance: Contact guard assist, +2 safety/equipment Gait Distance (Feet): 75 Feet (x2) Assistive device: Rolling walker (2 wheels) Gait Pattern/deviations: Decreased stride length, Step-through pattern, Trunk flexed Gait velocity: Dec Gait velocity interpretation: <1.8 ft/sec, indicate of risk for recurrent falls   General Gait Details: Requiring dense VC for good posture with RW. No knee buckling or LOB noted. Required one stadning rest break during gait as he states he feels like his legs are giving out  Stairs            Wheelchair Mobility     Tilt Bed    Modified Rankin (Stroke Patients Only)       Balance Overall balance assessment: Needs assistance Sitting-balance support: No upper extremity supported, Feet supported Sitting balance-Leahy Scale: Fair  Standing balance support: Bilateral upper extremity supported, Reliant on assistive device for balance, During functional activity Standing balance-Leahy Scale: Poor Standing balance comment: Reliant on external support                             Pertinent Vitals/Pain Pain Assessment Pain Assessment: No/denies pain    Home Living  Family/patient expects to be discharged to:: Private residence Living Arrangements: Spouse/significant other Available Help at Discharge: Family;Available 24 hours/day Type of Home: House Home Access: Stairs to enter Entrance Stairs-Rails: Can reach both;Right;Left Entrance Stairs-Number of Steps: 3   Home Layout: One level Home Equipment: Agricultural Consultant (2 wheels);Rollator (4 wheels);Cane - single point;Wheelchair - manual;BSC/3in1;Shower seat      Prior Function Prior Level of Function : Independent/Modified Independent;Driving;History of Falls (last six months)             Mobility Comments: Switches between using no AD and occasionally using either SPC, RW, or rollator. Describes extensive hx of falls because his legs give out. He believes he is getting Parkinson's. ADLs Comments: Requires some help getting socks on. Has slip-on shoes. Reports sleeping in recliner. Typically only walks very short distances. To get to doctor appointments, pt's wife typically drives him and she pushes him in a wheelchair into the building     Extremity/Trunk Assessment   Upper Extremity Assessment Upper Extremity Assessment: Defer to OT evaluation    Lower Extremity Assessment Lower Extremity Assessment: Generalized weakness    Cervical / Trunk Assessment Cervical / Trunk Assessment: Kyphotic  Communication   Communication Communication: Impaired Factors Affecting Communication: Hearing impaired    Cognition Arousal: Alert Behavior During Therapy: WFL for tasks assessed/performed   PT - Cognitive impairments: No apparent impairments                         Following commands: Intact       Cueing Cueing Techniques: Verbal cues     General Comments General comments (skin integrity, edema, etc.): Pt reporting feeling like he is giving up and that he is not interested in doing things anymore. Attempted to talk to pt about senior services and enrichment opportunities,  but was overall uninterested. Discussed mobility deficits he has, including difficulty getting in and out of the car, and we discussed how PT specializes in assisting with this, but pt did not believe it would help    Exercises     Assessment/Plan    PT Assessment Patient needs continued PT services  PT Problem List Decreased strength;Decreased range of motion;Decreased balance;Decreased activity tolerance;Decreased mobility;Decreased knowledge of use of DME;Decreased safety awareness       PT Treatment Interventions DME instruction;Gait training;Functional mobility training;Stair training;Therapeutic activities;Therapeutic exercise;Balance training;Neuromuscular re-education;Patient/family education;Wheelchair mobility training;Manual techniques;Modalities    PT Goals (Current goals can be found in the Care Plan section)  Acute Rehab PT Goals Patient Stated Goal: to go home PT Goal Formulation: With patient Time For Goal Achievement: 08/20/24 Potential to Achieve Goals: Fair    Frequency Min 2X/week     Co-evaluation               AM-PAC PT 6 Clicks Mobility  Outcome Measure Help needed turning from your back to your side while in a flat bed without using bedrails?: A Little Help needed moving from lying on your back to sitting on the side of a flat bed without using bedrails?: A Little Help needed  moving to and from a bed to a chair (including a wheelchair)?: A Little Help needed standing up from a chair using your arms (e.g., wheelchair or bedside chair)?: A Little Help needed to walk in hospital room?: A Little Help needed climbing 3-5 steps with a railing? : A Lot 6 Click Score: 17    End of Session Equipment Utilized During Treatment: Gait belt;Oxygen  Activity Tolerance: Patient tolerated treatment well Patient left: in chair;with call bell/phone within reach Nurse Communication: Mobility status PT Visit Diagnosis: Unsteadiness on feet (R26.81);Other  abnormalities of gait and mobility (R26.89);Muscle weakness (generalized) (M62.81);Difficulty in walking, not elsewhere classified (R26.2)    Time: 9092-9048 PT Time Calculation (min) (ACUTE ONLY): 44 min   Charges:   PT Evaluation $PT Eval Moderate Complexity: 1 Mod PT Treatments $Therapeutic Activity: 23-37 mins PT General Charges $$ ACUTE PT VISIT: 1 Visit         Dee Paden, SPT   Emylee Decelle 08/06/2024, 11:17 AM

## 2024-08-06 NOTE — Progress Notes (Signed)
 Subjective: CC: Still some mild pain RLQ.  + flatus.  + stool recorded.  Objective: Vital signs in last 24 hours: Temp:  [98 F (36.7 C)-99.1 F (37.3 C)] 98.1 F (36.7 C) (11/21 0722) Pulse Rate:  [60-88] 63 (11/21 0722) Resp:  [16] 16 (11/21 0722) BP: (112-129)/(49-71) 119/58 (11/21 0722) SpO2:  [100 %] 100 % (11/21 0722) Last BM Date : 08/05/24  Intake/Output from previous day: 11/20 0701 - 11/21 0700 In: 117 [P.O.:117] Out: 0  Intake/Output this shift: No intake/output data recorded.  PE: Gen:  Alert, NAD, pleasant. Sitting on edge of bed.  Pulm:  Rate and effort normal on o2 Abd: Soft, ND, non tender. without rigidity or guarding. Protuberant. Psych: A&Ox3   Lab Results:  Recent Labs    08/05/24 0407 08/06/24 0512  WBC 13.1* 12.9*  HGB 13.7 11.8*  HCT 42.6 36.8*  PLT 316 285   BMET Recent Labs    08/05/24 0407 08/06/24 0512  NA 139 138  K 3.1* 4.2  CL 98 99  CO2 27 25  GLUCOSE 155* 147*  BUN 16 18  CREATININE 1.73* 1.88*  CALCIUM  8.9 8.7*   PT/INR No results for input(s): LABPROT, INR in the last 72 hours. CMP     Component Value Date/Time   NA 138 08/06/2024 0512   NA 140 06/15/2024 0927   K 4.2 08/06/2024 0512   CL 99 08/06/2024 0512   CO2 25 08/06/2024 0512   GLUCOSE 147 (H) 08/06/2024 0512   BUN 18 08/06/2024 0512   BUN 22 06/15/2024 0927   CREATININE 1.88 (H) 08/06/2024 0512   CREATININE 1.16 01/13/2015 0936   CALCIUM  8.7 (L) 08/06/2024 0512   PROT 6.6 08/05/2024 0407   ALBUMIN  2.9 (L) 08/05/2024 0407   AST 15 08/05/2024 0407   ALT 11 08/05/2024 0407   ALKPHOS 70 08/05/2024 0407   BILITOT 1.1 08/05/2024 0407   GFRNONAA 37 (L) 08/06/2024 0512   GFRNONAA 53 (L) 07/19/2014 1156   GFRAA >60 06/11/2020 0754   GFRAA 61 07/19/2014 1156   Lipase     Component Value Date/Time   LIPASE 45.0 08/02/2024 1230    Studies/Results: No results found.   Anti-infectives: Anti-infectives (From admission, onward)    Start      Dose/Rate Route Frequency Ordered Stop   08/04/24 1730  vancomycin  (VANCOCIN ) 2,500 mg in sodium chloride  0.9 % 500 mL IVPB        2,500 mg 262.5 mL/hr over 120 Minutes Intravenous  Once 08/04/24 1717 08/04/24 2331   08/04/24 1730  piperacillin -tazobactam (ZOSYN ) IVPB 3.375 g        3.375 g 12.5 mL/hr over 240 Minutes Intravenous Every 8 hours 08/04/24 1718     08/04/24 1715  vancomycin  (VANCOCIN ) IVPB 1000 mg/200 mL premix  Status:  Discontinued        1,000 mg 200 mL/hr over 60 Minutes Intravenous  Once 08/04/24 1711 08/04/24 1717        Assessment/Plan Alfred Carney is an 73 y.o. male with abdominal pain and concern for possible iliac limb/bypass graft infection with reactive inflammation of appendix versus primary appendicitis with resultant inflammation of iliac limb/graft.  - CT without obvious free air or free fluid. No obvious appendicolith. HDS. No peritonitis on exam. WBC continues to go down today. Would not recommend surgery at this time given above and CT showing tethering to his graft and involvement of ureter and several loops of small intestine making significant  adhesions making the likelihood of needing a more extensive laparotomy with risk of injury to any and all of the structure more likely.  -Would continue to observe with non-operative management on abx for now.  - Agree with broad spectrum antibiotics while awaiting blood cultures - Appreciate vascular input. They are tentatively planning repeat CT 11/22. - Okay for FLD. AM labs. Could advance tomorrow if continues to improve.  - We will follow with you closely  FEN - FLD. If patient begins to have worsening pain or concerning vitals would make NPO VTE: SCDs, Lovenox  ID: Zosyn   I reviewed nursing notes, Consultant (vascular) notes, hospitalist notes, last 24 h vitals and pain scores, last 48 h intake and output, last 24 h labs and trends, and last 24 h imaging results.     LOS: 2 days  Alfred LITTIE Nephew, MD,  FACS, FSSO Surgical Oncology, General Surgery, Trauma and Critical [facility], [place] [phone] for weekday/non holidays Check [email or web address] for coverage night/weekend/holidays under General Surgery

## 2024-08-06 NOTE — Progress Notes (Signed)
 PROGRESS NOTE    Alfred Carney  FMW:[id number] DOB: 05/03/1951 DOA: 08/04/2024 PCP: Corwin Antu, FNP  Chronically ill 73/M with history of COPD, chronic hypoxic respiratory failure on 2 L [place], chronic diastolic HF, HTN, HLD, CKD 3A, class III obesity, T2DM, OSA, GERD, PAD s/p bypass, CAD, carotid stenosis and thoracoabdominal aneurysm s/p repair who presented to the ED for evaluation of abdominal pain. Patient reports 4 to 5 days of nagging, intermittent right lower quadrant pain.  He was seen by his PCP on 11/17 for evaluation and a CT abdomen pelvis with contrast was obtained 11/18 for further evaluation. Results showed evidence of inflammatory changes around the right limb of his bypass graft as well as thickening of his appendix concerning for infection. Patient was called to present to the ED, labs noted WBC of 15, creatinine 1.7, started on broad-spectrum antibiotics, seen by vascular surgery and general surgery in consultation   Subjective: Patient seen and examined at bedside today.  Reports mild improvement in abdominal pain.  Denies having any diarrhea nausea vomiting.  Assessment and Plan:  Intra-abdominal infection, suspect appendicitis - Patient presented with 1 week of intermittent RLQ pain with associated nausea - CT abdomen pelvis from PCP office shows some inflammation around the right limb of the graft including some tethered small bowel, tip of the appendix, distal right ureter and concern for appendix inflammation  - General Surgery and vascular surgery following, continue IV Zosyn , downgrade diet to liquids for now -Broaden antibiotic coverage by adding vancomycin  -Felt to be a poor surgical candidate, recommended to continue broad-spectrum antibiotics for now with plan for repeat imaging in 2 to 3 days - Follow-up blood cultures, trend CBC.  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 Diffuse circumferential thickening and inflammatory changes along the right iliac limb of the bypass graft. There is infiltration of the surrounding fat plane.  - Vascular surgery following, recommending to broaden antibiotic coverage with vancomycin  along with Zosyn , if creatinine continues to improve vascular surgery is planning on doing a repeat CTA tomorrow - Continue pravastatin    T2DM with hyperglycemia - Last A1c 7.4% 1 month ago, blood sugar 194 on admission - SSI with meals, CBG monitoring   AKI on CKD 3A - Slight rise in creatinine to 1.72 from baseline of 1.2-1.5 - Monitor, hold ARB   HTN - Stable - Continue spironolactone , metoprolol , hold ARB   Chronic diastolic heart failure Chronic lower extremity swelling -Continue Lasix , metoprolol , and spironolactone    COPD/Chronic hypoxic respiratory failure - Chronic and stable, no signs of COPD exacerbation - Remains on baseline 2 L [place], continue - As needed DuoNebs   Hx of PSVT - Continue metoprolol    # HLD # CAD - Continue pravastatin    # Gout - Continue allopurinol    # BPH - Continue tamsulosin    # OSA - Not on CPAP   # Class III obesity Body mass index is 40.63 kg/m.   DVT prophylaxis: Lovenox  Code Status: DNR Family Communication: Discussed with patient detail, no family at bedside Disposition Plan:   Consultants:    Procedures:   Antimicrobials:    Objective: Vitals:   08/05/24 1615 08/05/24 2000 08/06/24 0448 08/06/24 0722  BP: (!) 129/49 121/63 112/71 (!) 119/58  Pulse: 67 88 60 63  Resp: 16   16  Temp: 98.7 F (  37.1 C) 99.1 F (37.3 C) 98 F (36.7 C) 98.1 F (36.7 C)  TempSrc: Oral Oral Oral Oral  SpO2: 100% 100% 100% 100%  Weight:      Height:       No intake or output data in the 24 hours ending 08/06/24 1457  Filed Weights   08/04/24 1238   Weight: 124.8 kg    Examination:  General exam: Obese chronically ill, AAO x 3 Respiratory system: decreased breath sounds at the bases Cardiovascular system: S1 & S2 heard, RRR.  Abd: Soft, mild right lower quadrant tenderness, bowel sounds present Central nervous system: Alert and oriented. No focal neurological deficits. Extremities: no edema Skin: No rashes Psychiatry:  Mood & affect appropriate.     Data Reviewed:   CBC: Recent Labs  Lab 08/02/24 1230 08/04/24 1244 08/05/24 0407 08/06/24 0512  WBC 14.3* 15.2* 13.1* 12.9*  NEUTROABS 10.8* 11.8*  --   --   HGB 13.1 13.3 13.7 11.8*  HCT 39.7 40.8 42.6 36.8*  MCV 85.6 87.2 87.5 87.8  PLT 370.0 338 316 285   Basic Metabolic Panel: Recent Labs  Lab 08/02/24 1230 08/04/24 1244 08/05/24 0407 08/06/24 0512  NA 141 138 139 138  K 3.1* 3.5 3.1* 4.2  CL 95* 94* 98 99  CO2 31 26 27 25   GLUCOSE 170* 194* 155* 147*  BUN 18 16 16 18   CREATININE 1.47 1.72* 1.73* 1.88*  CALCIUM  9.4 9.4 8.9 8.7*   GFR: Estimated Creatinine Clearance: 45 mL/min (A) (by C-G formula based on SCr of 1.88 mg/dL (H)). Liver Function Tests: Recent Labs  Lab 08/02/24 1230 08/04/24 1244 08/05/24 0407  AST 10 16 15   ALT 10 13 11   ALKPHOS 68 69 70  BILITOT 0.9 0.9 1.1  PROT 7.0 7.2 6.6  ALBUMIN  3.7 3.0* 2.9*   Recent Labs  Lab 08/02/24 1230  LIPASE 45.0  AMYLASE 35   No results for input(s): AMMONIA in the last 168 hours. Coagulation Profile: No results for input(s): INR, PROTIME in the last 168 hours. Cardiac Enzymes: No results for input(s): CKTOTAL, CKMB, CKMBINDEX, TROPONINI in the last 168 hours. BNP (last 3 results) Recent Labs    05/10/24 1224  PROBNP 89.0   HbA1C: No results for input(s): HGBA1C in the last 72 hours. CBG: Recent Labs  Lab 08/05/24 1127 08/05/24 1612 08/05/24 2121 08/06/24 0722 08/06/24 1130  GLUCAP 159* 187* 137* 145* 137*   Lipid Profile: No results for input(s): CHOL,  HDL, LDLCALC, TRIG, CHOLHDL, LDLDIRECT in the last 72 hours. Thyroid  Function Tests: No results for input(s): TSH, T4TOTAL, FREET4, T3FREE, THYROIDAB in the last 72 hours. Anemia Panel: No results for input(s): VITAMINB12, FOLATE, FERRITIN, TIBC, IRON, RETICCTPCT in the last 72 hours. Urine analysis:    Component Value Date/Time   COLORURINE YELLOW 08/04/2024 1317   APPEARANCEUR CLEAR 08/04/2024 1317   LABSPEC 1.012 08/04/2024 1317   PHURINE 6.0 08/04/2024 1317   GLUCOSEU NEGATIVE 08/04/2024 1317   HGBUR NEGATIVE 08/04/2024 1317   BILIRUBINUR NEGATIVE 08/04/2024 1317   BILIRUBINUR negative 08/02/2024 1234   KETONESUR NEGATIVE 08/04/2024 1317   PROTEINUR NEGATIVE 08/04/2024 1317   UROBILINOGEN 0.2 08/02/2024 1234   UROBILINOGEN 1.0 05/05/2014 1706   NITRITE NEGATIVE 08/04/2024 1317   LEUKOCYTESUR NEGATIVE 08/04/2024 1317   Sepsis Labs: @LABRCNTIP (procalcitonin:4,lacticidven:4)  ) Recent Results (from the past 240 hours)  Blood culture (routine x 2)     Status: None (Preliminary result)   Collection Time: 08/04/24  5:22 PM  Specimen: BLOOD  Result Value Ref Range Status   Specimen Description BLOOD LEFT ANTECUBITAL  Final   Special Requests   Final    BOTTLES DRAWN AEROBIC ONLY Blood Culture results may not be optimal due to an inadequate volume of blood received in culture bottles   Culture   Final    NO GROWTH 2 DAYS Performed at [facility], [address] [place], [place] [address]    Report Status PENDING  Incomplete  Blood culture (routine x 2)     Status: None (Preliminary result)   Collection Time: 08/04/24  9:05 PM   Specimen: BLOOD  Result Value Ref Range Status   Specimen Description BLOOD SITE NOT SPECIFIED  Final   Special Requests   Final    BOTTLES DRAWN AEROBIC ONLY Blood Culture adequate volume   Culture   Final    NO GROWTH 2 DAYS Performed at [facility], [address] [place], [place] [address]     Report Status PENDING  Incomplete     Radiology Studies: No results found.    Scheduled Meds:  allopurinol   200 mg Oral Daily   enoxaparin  (LOVENOX ) injection  40 mg Subcutaneous Q24H   furosemide   40 mg Oral Daily   insulin  aspart  0-15 Units Subcutaneous TID WC   insulin  aspart  0-5 Units Subcutaneous QHS   metoprolol  tartrate  25 mg Oral Q1500   pantoprazole   40 mg Oral Daily   pravastatin   40 mg Oral q1800   spironolactone   12.5 mg Oral Daily   tamsulosin   0.4 mg Oral QHS   Continuous Infusions:  piperacillin -tazobactam (ZOSYN )  IV 3.375 g (08/06/24 0611)   vancomycin  1,000 mg (08/06/24 1232)     LOS: 2 days    Time spent:    Sigurd Pac, MD [facility]   08/06/2024, 2:57 PM

## 2024-08-06 NOTE — Progress Notes (Addendum)
 Progress Note    08/06/2024 6:43 AM Hospital Day 2  Subjective:  says he had some right sided abdominal and back pain overnight that required pain medication but is improved this morning.  Back pain is not new.  He is on oxygen  at home 24 hrs  Tm 99.1 now 98 110's-120's systolic HR 60-80's 100% 2LO2NC  Vitals:   08/05/24 2000 08/06/24 0448  BP: 121/63 112/71  Pulse: 88 60  Resp:    Temp: 99.1 F (37.3 C) 98 F (36.7 C)  SpO2: 100% 100%    Physical Exam: General:  no distress Lungs:  non labored abdomen:  soft and non tender to palpation  CBC    Component Value Date/Time   WBC 12.9 (H) 08/06/2024 0512   RBC 4.19 (L) 08/06/2024 0512   HGB 11.8 (L) 08/06/2024 0512   HCT 36.8 (L) 08/06/2024 0512   PLT 285 08/06/2024 0512   MCV 87.8 08/06/2024 0512   MCH 28.2 08/06/2024 0512   MCHC 32.1 08/06/2024 0512   RDW 13.8 08/06/2024 0512   LYMPHSABS 2.2 08/04/2024 1244   MONOABS 1.0 08/04/2024 1244   EOSABS 0.0 08/04/2024 1244   BASOSABS 0.1 08/04/2024 1244    BMET    Component Value Date/Time   NA 138 08/06/2024 0512   NA 140 06/15/2024 0927   K 4.2 08/06/2024 0512   CL 99 08/06/2024 0512   CO2 25 08/06/2024 0512   GLUCOSE 147 (H) 08/06/2024 0512   BUN 18 08/06/2024 0512   BUN 22 06/15/2024 0927   CREATININE 1.88 (H) 08/06/2024 0512   CREATININE 1.16 01/13/2015 0936   CALCIUM  8.7 (L) 08/06/2024 0512   GFRNONAA 37 (L) 08/06/2024 0512   GFRNONAA 53 (L) 07/19/2014 1156   GFRAA >60 06/11/2020 0754   GFRAA 61 07/19/2014 1156    INR    Component Value Date/Time   INR 1.1 06/10/2020 0048     Intake/Output Summary (Last 24 hours) at 08/06/2024 0643 Last data filed at 08/05/2024 0900 Gross per 24 hour  Intake 117 ml  Output 0 ml  Net 117 ml     Assessment/Plan:  73 y.o. male  with hx of TAA repair 2004 now with abdominal pain with concern of inflammatory changes around the right limb of his bpg and possible appendix infection.   Hospital Day 2  -pt  with some right sided abdominal and back pain overnight that required pain medication but improved this morning.  Abdomen is soft and non tender to palpation.  AKI on CKD with rise in creatinine to 1.88 from 1.73.  pt ARB on hold. Will defer to [facility] about diuretics and Lovenox  and IV hydration.  Plan for re-scan CTA tomorrow but may be deferred if renal function still elevated.  Discussed this with the pt.  -blood cultures x 2 with no growth currently -leukocytosis slightly improved to 12.9k from 13.1k.  pt on Zosyn  and received one time dose of Vanc on 11/19.  Would recommend starting vancomycin  per pharmacy for broad spectrum coverage as well given prosthetic aortic bypass.    Lucie Apt, PA-C [facility] [phone] 08/06/2024 6:43 AM  I have seen and evaluated the patient. I agree with the PA note as documented above.  States he had some right sided abdominal pain last night.  This is improved this morning.  Blood cultures no growth to date.  White count improved to 12.  Would continue broad-spectrum antibiotics with Vanc Zosyn  until final cultures negative.  Would  need at least 6 weeks of antibiotics at discharge in my opinion for gram-negative coverage.  Needs repeat imaging this weekend but will need to monitor his renal function.  States if he had evidence of graft infection requiring surgery does not think he could endure the operation.  Lonni DOROTHA Gaskins, MD [facility] [place] Office: [phone]

## 2024-08-06 NOTE — Evaluation (Signed)
 Occupational Therapy Evaluation/Discharge Patient Details Name: Alfred Carney MRN: [id number] DOB: [birth year] Today's Date: 08/06/2024   History of Present Illness   Pt is a 73 y.o. M who presents 08/04/2024 with abdominal pain and concern of inflammatory changes around the right limb of his bpg and possible appendix infection.  CT without obvious free air or free fluid. Not recommending surgery at this time; plan for conservative management on abx for now. Significant PMH includes: TAA repair 2044, NIDDM, PAD, HLD, HTN, history of gout, BPH, COPD (2L at baseline), HFpEF.     Clinical Impressions Patient evaluated by Occupational Therapy with no further acute OT needs identified. All education has been completed and the patient has no further questions.  See below for any follow-up Occupational Therapy or equipment needs. OT is signing off. Thank you for this referral.      If plan is discharge home, recommend the following:   A little help with bathing/dressing/bathroom;Assist for transportation     Functional Status Assessment   Patient has not had a recent decline in their functional status     Equipment Recommendations   None recommended by OT      Precautions/Restrictions   Precautions Precautions: Fall Recall of Precautions/Restrictions: Intact Precaution/Restrictions Comments: 2L O2 baseline Restrictions Weight Bearing Restrictions Per Provider Order: No     Mobility   Transfers Overall transfer level: Needs assistance Equipment used: Rolling walker (2 wheels) Transfers: Sit to/from Stand Sit to Stand: Supervision       Balance Overall balance assessment: History of Falls         ADL either performed or assessed with clinical judgement   ADL Overall ADL's : At baseline      General ADL Comments: Pt reports that he is able to complete all bathing/dressing/toileting tasks without assistance. Only had trouble with socks. Provided education on use of  sock aid although pt states he is familiar already. Uses long handled sponge for LB bathing.     Vision Baseline Vision/History: 0 No visual deficits Ability to See in Adequate Light: 0 Adequate Patient Visual Report: No change from baseline Vision Assessment?: No apparent visual deficits     Perception Perception: Not tested       Praxis Praxis: Not tested       Pertinent Vitals/Pain Pain Assessment Pain Assessment: No/denies pain     Extremity/Trunk Assessment Upper Extremity Assessment Upper Extremity Assessment: Generalized weakness   Lower Extremity Assessment Lower Extremity Assessment: Defer to PT evaluation   Cervical / Trunk Assessment Cervical / Trunk Assessment: Kyphotic   Communication Communication Communication: Impaired Factors Affecting Communication: Hearing impaired   Cognition Arousal: Alert Behavior During Therapy: WFL for tasks assessed/performed      Following commands: Intact       Cueing  General Comments   Cueing Techniques: Verbal cues  Provided patient education on DME/AE that may benefit pt now or in the future, discussed Parkinson's specific therapy. Pt verbalized understanding.           Home Living Family/patient expects to be discharged to:: Private residence Living Arrangements: Spouse/significant other Available Help at Discharge: Family;Available 24 hours/day Type of Home: House Home Access: Stairs to enter [employer] of Steps: 3 Entrance Stairs-Rails: Can reach both;Right;Left Home Layout: One level     Bathroom Shower/Tub: Producer, Television/film/video: Handicapped height Bathroom Accessibility: Yes   Home Equipment: Agricultural Consultant (2 wheels);Rollator (4 wheels);Cane - single point;Wheelchair - manual;BSC/3in1;Shower seat   Additional Comments: uses RW  at baseline, short ambulation distances, mainly stays at home      Prior Functioning/Environment Prior Level of Function :  Independent/Modified Independent;Driving;History of Falls (last six months)    Mobility Comments: Switches between using no AD and occasionally using either SPC, RW, or rollator. Describes extensive hx of falls because his legs give out. He believes he is getting Parkinson's. ADLs Comments: Requires some help getting socks on. Has slip-on shoes. Reports sleeping in recliner. Typically only walks very short distances. To get to doctor appointments, pt's wife typically drives him and she pushes him in a wheelchair into the building    OT Problem List: Decreased strength;Decreased coordination        OT Goals(Current goals can be found in the care plan section)   Acute Rehab OT Goals OT Goal Formulation: All assessment and education complete, DC therapy         AM-PAC OT 6 Clicks Daily Activity     Outcome Measure Help from another person eating meals?: None Help from another person taking care of personal grooming?: None Help from another person toileting, which includes using toliet, bedpan, or urinal?: None Help from another person bathing (including washing, rinsing, drying)?: None Help from another person to put on and taking off regular upper body clothing?: None Help from another person to put on and taking off regular lower body clothing?: A Little 6 Click Score: 23   End of Session    Activity Tolerance: Patient tolerated treatment well Patient left: in bed;with call bell/phone within reach;with nursing/sitter in room  OT Visit Diagnosis: Unsteadiness on feet (R26.81);Repeated falls (R29.6)                Time: 8884-8870 OT Time Calculation (min): 14 min Charges:  OT General Charges $OT Visit: 1 Visit OT Evaluation $OT Eval Low Complexity: 1 Low  Leita Howell, OTR/L,CBIS  Supplemental OT - [facility] and [facility] Secure Chat Preferred    Tahra Hitzeman, Leita BIRCH 08/06/2024, 1:05 PM

## 2024-08-06 NOTE — Progress Notes (Signed)
 Pharmacy Antibiotic Note  Alfred Carney is a 73 y.o. male admitted on 08/04/2024 with intra abdominal infxn.  Pharmacy has been consulted for vancomycin  dosing.  Plan: Vancomycin  1000 mg IV q 24h (eAUC 488, Scr 1.88) Goal trough 15-56mcg/mL, Goal AUC 400-600 -F/u renal function, LOT, and culture data -F/u vancomycin  levels PRN per protocol   Height: 5' 9 (175.3 cm) Weight: 124.8 kg (275 lb 2.2 oz) IBW/kg (Calculated) : 70.7  Temp (24hrs), Avg:98.5 F (36.9 C), Min:98 F (36.7 C), Max:99.1 F (37.3 C)  Recent Labs  Lab 08/02/24 1230 08/04/24 1244 08/05/24 0407 08/06/24 0512  WBC 14.3* 15.2* 13.1* 12.9*  CREATININE 1.47 1.72* 1.73* 1.88*    Estimated Creatinine Clearance: 45 mL/min (A) (by C-G formula based on SCr of 1.88 mg/dL (H)).    Allergies  Allergen Reactions   Glucotrol  [Glipizide ] Other (See Comments)    Dizziness    Jardiance  [Empagliflozin ] Other (See Comments)    Stomach pain    Antimicrobials this admission: Zosyn  11/19> Vanc 11/19 x1, 11/21>  Dose adjustments this admission:   Microbiology results: 11/19 BCX NGx2  Thank you for allowing pharmacy to be a part of this patient's care.  Sharyne Glatter, PharmD, BCCCP Critical Care Clinical Pharmacist 08/06/2024 8:36 AM

## 2024-08-07 DIAGNOSIS — B999 Unspecified infectious disease: Secondary | ICD-10-CM | POA: Diagnosis not present

## 2024-08-07 LAB — COMPREHENSIVE METABOLIC PANEL WITH GFR
ALT: 12 U/L (ref 0–44)
AST: 15 U/L (ref 15–41)
Albumin: 2.5 g/dL — ABNORMAL LOW (ref 3.5–5.0)
Alkaline Phosphatase: 51 U/L (ref 38–126)
Anion gap: 17 — ABNORMAL HIGH (ref 5–15)
BUN: 16 mg/dL (ref 8–23)
CO2: 24 mmol/L (ref 22–32)
Calcium: 8.8 mg/dL — ABNORMAL LOW (ref 8.9–10.3)
Chloride: 97 mmol/L — ABNORMAL LOW (ref 98–111)
Creatinine, Ser: 2.01 mg/dL — ABNORMAL HIGH (ref 0.61–1.24)
GFR, Estimated: 34 mL/min — ABNORMAL LOW (ref >60)
Glucose, Bld: 130 mg/dL — ABNORMAL HIGH (ref 70–99)
Potassium: 3.7 mmol/L (ref 3.5–5.1)
Sodium: 138 mmol/L (ref 135–145)
Total Bilirubin: 1.1 mg/dL (ref 0.0–1.2)
Total Protein: 6 g/dL — ABNORMAL LOW (ref 6.5–8.1)

## 2024-08-07 LAB — CBC WITH DIFFERENTIAL/PLATELET
Abs Immature Granulocytes: 0.04 K/uL (ref 0.00–0.07)
Basophils Absolute: 0.1 K/uL (ref 0.0–0.1)
Basophils Relative: 0 %
Eosinophils Absolute: 0.1 K/uL (ref 0.0–0.5)
Eosinophils Relative: 1 %
HCT: 35.1 % — ABNORMAL LOW (ref 39.0–52.0)
Hemoglobin: 11.5 g/dL — ABNORMAL LOW (ref 13.0–17.0)
Immature Granulocytes: 0 %
Lymphocytes Relative: 22 %
Lymphs Abs: 2.9 K/uL (ref 0.7–4.0)
MCH: 28.3 pg (ref 26.0–34.0)
MCHC: 32.8 g/dL (ref 30.0–36.0)
MCV: 86.2 fL (ref 80.0–100.0)
Monocytes Absolute: 1 K/uL (ref 0.1–1.0)
Monocytes Relative: 8 %
Neutro Abs: 8.8 K/uL — ABNORMAL HIGH (ref 1.7–7.7)
Neutrophils Relative %: 69 %
Platelets: 316 K/uL (ref 150–400)
RBC: 4.07 MIL/uL — ABNORMAL LOW (ref 4.22–5.81)
RDW: 13.6 % (ref 11.5–15.5)
WBC: 12.9 K/uL — ABNORMAL HIGH (ref 4.0–10.5)
nRBC: 0 % (ref 0.0–0.2)

## 2024-08-07 LAB — GLUCOSE, CAPILLARY
Glucose-Capillary: 149 mg/dL — ABNORMAL HIGH (ref 70–99)
Glucose-Capillary: 170 mg/dL — ABNORMAL HIGH (ref 70–99)
Glucose-Capillary: 114 mg/dL — ABNORMAL HIGH (ref 70–99)
Glucose-Capillary: 163 mg/dL — ABNORMAL HIGH (ref 70–99)

## 2024-08-07 MED ORDER — VANCOMYCIN HCL IN DEXTROSE 1-5 GM/200ML-% IV SOLN
1000.0000 mg | INTRAVENOUS | Status: DC
  Administered 2024-08-07 – 2024-08-08 (×2): 1000 mg via INTRAVENOUS
  Filled 2024-08-07 (×2): qty 200

## 2024-08-07 NOTE — Progress Notes (Signed)
 Subjective/Chief Complaint: Patient has minimal abdominal pain   Objective: Vital signs in last 24 hours: Temp:  [98.3 F (36.8 C)-98.7 F (37.1 C)] 98.3 F (36.8 C) (11/22 0715) Pulse Rate:  [63-93] 93 (11/22 0715) Resp:  [16-18] 16 (11/22 0715) BP: (91-119)/(46-61) 91/61 (11/22 0715) SpO2:  [98 %-100 %] 98 % (11/22 0715) Last BM Date : 08/05/24  Intake/Output from previous day: 11/21 0701 - 11/22 0700 In: 441.8 [IV Piggyback:441.8] Out: 175 [Urine:175] Intake/Output this shift: No intake/output data recorded.  Abdomen: Soft nontender without rebound or guarding  Lab Results:  Recent Labs    08/06/24 0512 08/07/24 0319  WBC 12.9* 12.9*  HGB 11.8* 11.5*  HCT 36.8* 35.1*  PLT 285 316   BMET Recent Labs    08/06/24 0512 08/07/24 0319  NA 138 138  K 4.2 3.7  CL 99 97*  CO2 25 24  GLUCOSE 147* 130*  BUN 18 16  CREATININE 1.88* 2.01*  CALCIUM  8.7* 8.8*   PT/INR No results for input(s): LABPROT, INR in the last 72 hours. ABG No results for input(s): PHART, HCO3 in the last 72 hours.  Invalid input(s): PCO2, PO2  Studies/Results: No results found.  Anti-infectives: Anti-infectives (From admission, onward)    Start     Dose/Rate Route Frequency Ordered Stop   08/07/24 1200  vancomycin  (VANCOCIN ) IVPB 1000 mg/200 mL premix        1,000 mg 200 mL/hr over 60 Minutes Intravenous Every 24 hours 08/07/24 0814     08/06/24 0930  vancomycin  (VANCOCIN ) IVPB 1000 mg/200 mL premix  Status:  Discontinued        1,000 mg 200 mL/hr over 60 Minutes Intravenous Every 24 hours 08/06/24 0839 08/07/24 0814   08/04/24 1730  vancomycin  (VANCOCIN ) 2,500 mg in sodium chloride  0.9 % 500 mL IVPB        2,500 mg 262.5 mL/hr over 120 Minutes Intravenous  Once 08/04/24 1717 08/04/24 2331   08/04/24 1730  piperacillin -tazobactam (ZOSYN ) IVPB 3.375 g        3.375 g 12.5 mL/hr over 240 Minutes Intravenous Every 8 hours 08/04/24 1718     08/04/24 1715  vancomycin   (VANCOCIN ) IVPB 1000 mg/200 mL premix  Status:  Discontinued        1,000 mg 200 mL/hr over 60 Minutes Intravenous  Once 08/04/24 1711 08/04/24 1717       Assessment/Plan:  Alfred Carney is an 73 y.o. male with abdominal pain and concern for possible iliac limb/bypass graft infection with reactive inflammation of appendix versus primary appendicitis with resultant inflammation of iliac limb/graft.  - CT without obvious free air or free fluid. No obvious appendicolith. HDS. No peritonitis on exam. WBC continues to go down today. Would not recommend surgery at this time given above and CT showing tethering to his graft and involvement of ureter and several loops of small intestine making significant adhesions making the likelihood of needing a more extensive laparotomy with risk of injury to any and all of the structure more likely.  -Would continue to observe with non-operative management on abx for now.  - Agree with broad spectrum antibiotics while awaiting blood cultures - Appreciate vascular input. They are tentatively planning repeat CT 11/22. - Okay for FLD. AM labs. Could advance tomorrow if continues to improve.  - Pain is overall better advance diet   FEN - FLD. If patient begins to have worsening pain or concerning vitals would make NPO VTE: SCDs, Lovenox  ID: Zosyn    I  reviewed nursing notes, Consultant (vascular) notes, hospitalist notes, last 24 h vitals and pain scores, last 48 h intake and output, last 24 h labs and trends, and last 24 h imaging results.   LOS: 3 days    Debby DELENA Shipper MD 08/07/2024 Moderate complexity

## 2024-08-07 NOTE — Plan of Care (Signed)
   Problem: Safety: Goal: Ability to remain free from injury will improve Outcome: Progressing   Problem: Skin Integrity: Goal: Risk for impaired skin integrity will decrease Outcome: Progressing

## 2024-08-07 NOTE — Plan of Care (Signed)

## 2024-08-07 NOTE — Progress Notes (Signed)
  Progress Note    08/07/2024 8:58 AM Hospital Day 2  Subjective: Had some abdominal pain overnight.  States that he notices it more with p.o. intake No nausea, no vomiting.  Last bowel movement 2 days ago   Vitals:   08/06/24 2300 08/07/24 0715  BP: (!) 111/46 91/61  Pulse: 63 93  Resp: 18 16  Temp: 98.7 F (37.1 C) 98.3 F (36.8 C)  SpO2: 99% 98%    Physical Exam: General:  no distress, obese Lungs:  non labored abdomen:  soft and non tender to palpation  CBC    Component Value Date/Time   WBC 12.9 (H) 08/07/2024 0319   RBC 4.07 (L) 08/07/2024 0319   HGB 11.5 (L) 08/07/2024 0319   HCT 35.1 (L) 08/07/2024 0319   PLT 316 08/07/2024 0319   MCV 86.2 08/07/2024 0319   MCH 28.3 08/07/2024 0319   MCHC 32.8 08/07/2024 0319   RDW 13.6 08/07/2024 0319   LYMPHSABS 2.9 08/07/2024 0319   MONOABS 1.0 08/07/2024 0319   EOSABS 0.1 08/07/2024 0319   BASOSABS 0.1 08/07/2024 0319    BMET    Component Value Date/Time   NA 138 08/07/2024 0319   NA 140 06/15/2024 0927   K 3.7 08/07/2024 0319   CL 97 (L) 08/07/2024 0319   CO2 24 08/07/2024 0319   GLUCOSE 130 (H) 08/07/2024 0319   BUN 16 08/07/2024 0319   BUN 22 06/15/2024 0927   CREATININE 2.01 (H) 08/07/2024 0319   CREATININE 1.16 01/13/2015 0936   CALCIUM  8.8 (L) 08/07/2024 0319   GFRNONAA 34 (L) 08/07/2024 0319   GFRNONAA 53 (L) 07/19/2014 1156   GFRAA >60 06/11/2020 0754   GFRAA 61 07/19/2014 1156    INR    Component Value Date/Time   INR 1.1 06/10/2020 0048     Intake/Output Summary (Last 24 hours) at 08/07/2024 0858 Last data filed at 08/06/2024 1958 Gross per 24 hour  Intake 441.76 ml  Output 175 ml  Net 266.76 ml     Assessment/Plan:  73 y.o. male  with hx of TAA repair 2004 now with abdominal pain with concern of inflammatory changes around the right limb of his bpg and possible appendix infection.   LOS day 3  Patient would benefit from rescan when able.  Currently, creatinine is increased to  2.  Would hold at this time. Continues to have mild leukocytosis  Will continue to follow

## 2024-08-07 NOTE — Progress Notes (Signed)
 PROGRESS NOTE    Alfred Carney  FMW:[id number] DOB: 08-Apr-1951 DOA: 08/04/2024 PCP: Corwin Antu, FNP  Chronically ill 73/M with history of COPD, chronic hypoxic respiratory failure on 2 L [place], chronic diastolic HF, HTN, HLD, CKD 3A, class III obesity, T2DM, OSA, GERD, PAD s/p bypass, CAD, carotid stenosis and thoracoabdominal aneurysm s/p repair who presented to the ED for evaluation of abdominal pain. Patient reports 4 to 5 days of nagging, intermittent right lower quadrant pain.  He was seen by his PCP on 11/17 for evaluation and a CT abdomen pelvis with contrast was obtained 11/18 for further evaluation. Results showed evidence of inflammatory changes around the right limb of his bypass graft as well as thickening of his appendix concerning for infection. Patient was called to present to the ED, labs noted WBC of 15, creatinine 1.7, started on broad-spectrum antibiotics, seen by vascular surgery and general surgery in consultation   Subjective: Patient seen and examined at bedside today.  Reports mild improvement in abdominal pain.  Denies having any diarrhea nausea vomiting.  He is still requesting for pain medicine for his abdominal pain  Assessment and Plan:  Intra-abdominal infection, suspect appendicitis - Patient presented with 1 week of intermittent RLQ pain with associated nausea - CT abdomen pelvis from PCP office shows some inflammation around the right limb of the graft including some tethered small bowel, tip of the appendix, distal right ureter and concern for appendix inflammation  - General Surgery and vascular surgery following, continue IV Zosyn , downgrade diet to liquids for now -Broaden antibiotic coverage by adding vancomycin  -Felt to be a poor surgical candidate, recommended to continue broad-spectrum antibiotics for now with plan for repeat imaging in 2 to 3 days - Follow-up blood cultures, trend CBC.  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 Diffuse circumferential thickening and inflammatory changes along the right iliac limb of the bypass graft. There is infiltration of the surrounding fat plane.  - Vascular surgery following, recommending to broaden antibiotic coverage with vancomycin  along with Zosyn , if creatinine continues to improve vascular surgery is planning on doing a repeat CTA tomorrow - Continue pravastatin    T2DM with hyperglycemia - Last A1c 7.4% 1 month ago, blood sugar 194 on admission - SSI with meals, CBG monitoring   AKI on CKD 3A - Slight rise in creatinine to 1.72 from baseline of 1.2-1.5 - Monitor, hold ARB   HTN - Stable - Continue spironolactone , metoprolol , hold ARB   Chronic diastolic heart failure Chronic lower extremity swelling -Continue Lasix , metoprolol , and spironolactone    COPD/Chronic hypoxic respiratory failure - Chronic and stable, no signs of COPD exacerbation - Remains on baseline 2 L [place], continue - As needed DuoNebs   Hx of PSVT - Continue metoprolol    # HLD # CAD - Continue pravastatin    # Gout - Continue allopurinol    # BPH - Continue tamsulosin    # OSA - Not on CPAP   # Class III obesity Body mass index is 40.63 kg/m.   DVT prophylaxis: Lovenox  Code Status: DNR Family Communication: Discussed with patient detail, no family at bedside Disposition Plan:   Consultants:    Procedures:   Antimicrobials:    Objective: Vitals:   08/06/24 1538 08/06/24 1954 08/06/24 2300 08/07/24 0715  BP: (!) 119/51 (!) 114/51 (!) 111/46 91/61  Pulse: 67  85 63 93  Resp: 16 17 18 16   Temp: 98.3 F (36.8 C) 98.4 F (36.9 C) 98.7 F (37.1 C) 98.3 F (36.8 C)  TempSrc: Oral Oral Oral Oral  SpO2: 100% 99% 99% 98%  Weight:      Height:        Intake/Output Summary (Last 24 hours) at  08/07/2024 9081 Last data filed at 08/06/2024 1958 Gross per 24 hour  Intake 441.76 ml  Output 175 ml  Net 266.76 ml    Filed Weights   08/04/24 1238  Weight: 124.8 kg    Examination:  General exam: Obese chronically ill, AAO x 3 Respiratory system: decreased breath sounds at the bases Cardiovascular system: S1 & S2 heard, RRR.  Abd: Soft, mild right lower quadrant tenderness, bowel sounds present Central nervous system: Alert and oriented. No focal neurological deficits. Extremities: no edema Skin: No rashes Psychiatry:  Mood & affect appropriate.     Data Reviewed:   CBC: Recent Labs  Lab 08/02/24 1230 08/04/24 1244 08/05/24 0407 08/06/24 0512 08/07/24 0319  WBC 14.3* 15.2* 13.1* 12.9* 12.9*  NEUTROABS 10.8* 11.8*  --   --  8.8*  HGB 13.1 13.3 13.7 11.8* 11.5*  HCT 39.7 40.8 42.6 36.8* 35.1*  MCV 85.6 87.2 87.5 87.8 86.2  PLT 370.0 338 316 285 316   Basic Metabolic Panel: Recent Labs  Lab 08/02/24 1230 08/04/24 1244 08/05/24 0407 08/06/24 0512 08/07/24 0319  NA 141 138 139 138 138  K 3.1* 3.5 3.1* 4.2 3.7  CL 95* 94* 98 99 97*  CO2 31 26 27 25 24   GLUCOSE 170* 194* 155* 147* 130*  BUN 18 16 16 18 16   CREATININE 1.47 1.72* 1.73* 1.88* 2.01*  CALCIUM  9.4 9.4 8.9 8.7* 8.8*   GFR: Estimated Creatinine Clearance: 42.1 mL/min (A) (by C-G formula based on SCr of 2.01 mg/dL (H)). Liver Function Tests: Recent Labs  Lab 08/02/24 1230 08/04/24 1244 08/05/24 0407 08/07/24 0319  AST 10 16 15 15   ALT 10 13 11 12   ALKPHOS 68 69 70 51  BILITOT 0.9 0.9 1.1 1.1  PROT 7.0 7.2 6.6 6.0*  ALBUMIN  3.7 3.0* 2.9* 2.5*   Recent Labs  Lab 08/02/24 1230  LIPASE 45.0  AMYLASE 35   No results for input(s): AMMONIA in the last 168 hours. Coagulation Profile: No results for input(s): INR, PROTIME in the last 168 hours. Cardiac Enzymes: No results for input(s): CKTOTAL, CKMB, CKMBINDEX, TROPONINI in the last 168 hours. BNP (last 3 results) Recent  Labs    05/10/24 1224  PROBNP 89.0   HbA1C: No results for input(s): HGBA1C in the last 72 hours. CBG: Recent Labs  Lab 08/06/24 0722 08/06/24 1130 08/06/24 1538 08/06/24 2137 08/07/24 0730  GLUCAP 145* 137* 131* 142* 149*   Lipid Profile: No results for input(s): CHOL, HDL, LDLCALC, TRIG, CHOLHDL, LDLDIRECT in the last 72 hours. Thyroid  Function Tests: No results for input(s): TSH, T4TOTAL, FREET4, T3FREE, THYROIDAB in the last 72 hours. Anemia Panel: No results for input(s): VITAMINB12, FOLATE, FERRITIN, TIBC, IRON, RETICCTPCT in the last 72 hours. Urine analysis:    Component Value Date/Time   COLORURINE YELLOW 08/04/2024 1317   APPEARANCEUR CLEAR 08/04/2024 1317   LABSPEC 1.012 08/04/2024 1317   PHURINE 6.0 08/04/2024 1317   GLUCOSEU NEGATIVE 08/04/2024 1317   HGBUR NEGATIVE 08/04/2024 1317   BILIRUBINUR NEGATIVE 08/04/2024 1317   BILIRUBINUR negative 08/02/2024 1234   KETONESUR NEGATIVE 08/04/2024 1317   PROTEINUR NEGATIVE 08/04/2024 1317  UROBILINOGEN 0.2 08/02/2024 1234   UROBILINOGEN 1.0 05/05/2014 1706   NITRITE NEGATIVE 08/04/2024 1317   LEUKOCYTESUR NEGATIVE 08/04/2024 1317   Sepsis Labs: @LABRCNTIP (procalcitonin:4,lacticidven:4)  ) Recent Results (from the past 240 hours)  Blood culture (routine x 2)     Status: None (Preliminary result)   Collection Time: 08/04/24  5:22 PM   Specimen: BLOOD  Result Value Ref Range Status   Specimen Description BLOOD LEFT ANTECUBITAL  Final   Special Requests   Final    BOTTLES DRAWN AEROBIC ONLY Blood Culture results may not be optimal due to an inadequate volume of blood received in culture bottles   Culture   Final    NO GROWTH 3 DAYS Performed at [facility], [address], [place], [place] [address]    Report Status PENDING  Incomplete  Blood culture (routine x 2)     Status: None (Preliminary result)   Collection Time: 08/04/24  9:05 PM   Specimen: BLOOD   Result Value Ref Range Status   Specimen Description BLOOD SITE NOT SPECIFIED  Final   Special Requests   Final    BOTTLES DRAWN AEROBIC ONLY Blood Culture adequate volume   Culture   Final    NO GROWTH 3 DAYS Performed at [facility], [address] [place], [place] [address]    Report Status PENDING  Incomplete     Radiology Studies: No results found.    Scheduled Meds:  allopurinol   200 mg Oral Daily   enoxaparin  (LOVENOX ) injection  40 mg Subcutaneous Q24H   furosemide   40 mg Oral Daily   insulin  aspart  0-15 Units Subcutaneous TID WC   insulin  aspart  0-5 Units Subcutaneous QHS   metoprolol  tartrate  25 mg Oral Q1500   pantoprazole   40 mg Oral Daily   pravastatin   40 mg Oral q1800   spironolactone   12.5 mg Oral Daily   tamsulosin   0.4 mg Oral QHS   Continuous Infusions:  piperacillin -tazobactam (ZOSYN )  IV 3.375 g (08/07/24 0744)   vancomycin        LOS: 3 days    Time spent:    Landon Baller MD [facility]   08/07/2024, 9:18 AM

## 2024-08-07 NOTE — Progress Notes (Signed)
 Mobility Specialist Progress Note:    08/07/24 0930  Mobility  Activity Stood at bedside;Dangled on edge of bed (Heel/Toe Raises, Leg Lift/Ext, Heel Slides. Mini Squats x10)  Level of Assistance Contact guard assist, steadying assist  Assistive Device Other (Comment) (HHA)  Range of Motion/Exercises Active Assistive;All extremities  Activity Response Tolerated fair;RN notified (Pt became faint at the end of session. Possibly d/t low pressure)  Mobility Referral Yes  Mobility visit 1 Mobility  Mobility Specialist Start Time (ACUTE ONLY) 0930  Mobility Specialist Stop Time (ACUTE ONLY) 0950  Mobility Specialist Time Calculation (min) (ACUTE ONLY) 20 min   Received pt laying in bed agreeable to session. No c/o any symptoms. Pt able to perform most movements from guided assist. Pt able to transfer to EOB and stand w/o assist. Pt needing cueing to correct form. Upon sitting, pt became faint. BP 105/53 (68). Returned pt to bed w/ all needs met. Notified RN  Venetia Keel Mobility Specialist Please Contact via SecureChat or Rehab Office at [phone]

## 2024-08-08 DIAGNOSIS — B999 Unspecified infectious disease: Secondary | ICD-10-CM | POA: Diagnosis not present

## 2024-08-08 LAB — CBC
HCT: 32.9 % — ABNORMAL LOW (ref 39.0–52.0)
Hemoglobin: 10.6 g/dL — ABNORMAL LOW (ref 13.0–17.0)
MCH: 28 pg (ref 26.0–34.0)
MCHC: 32.2 g/dL (ref 30.0–36.0)
MCV: 87 fL (ref 80.0–100.0)
Platelets: 286 K/uL (ref 150–400)
RBC: 3.78 MIL/uL — ABNORMAL LOW (ref 4.22–5.81)
RDW: 13.6 % (ref 11.5–15.5)
WBC: 10.1 K/uL (ref 4.0–10.5)
nRBC: 0 % (ref 0.0–0.2)

## 2024-08-08 LAB — GLUCOSE, CAPILLARY
Glucose-Capillary: 152 mg/dL — ABNORMAL HIGH (ref 70–99)
Glucose-Capillary: 156 mg/dL — ABNORMAL HIGH (ref 70–99)
Glucose-Capillary: 164 mg/dL — ABNORMAL HIGH (ref 70–99)
Glucose-Capillary: 176 mg/dL — ABNORMAL HIGH (ref 70–99)
Glucose-Capillary: 177 mg/dL — ABNORMAL HIGH (ref 70–99)

## 2024-08-08 LAB — BASIC METABOLIC PANEL WITH GFR
Anion gap: 12 (ref 5–15)
BUN: 22 mg/dL (ref 8–23)
CO2: 28 mmol/L (ref 22–32)
Calcium: 8.9 mg/dL (ref 8.9–10.3)
Chloride: 96 mmol/L — ABNORMAL LOW (ref 98–111)
Creatinine, Ser: 2.2 mg/dL — ABNORMAL HIGH (ref 0.61–1.24)
GFR, Estimated: 31 mL/min — ABNORMAL LOW (ref >60)
Glucose, Bld: 158 mg/dL — ABNORMAL HIGH (ref 70–99)
Potassium: 3.6 mmol/L (ref 3.5–5.1)
Sodium: 136 mmol/L (ref 135–145)

## 2024-08-08 LAB — VANCOMYCIN, RANDOM: Vancomycin Rm: 19 ug/mL

## 2024-08-08 MED ORDER — METRONIDAZOLE 500 MG/100ML IV SOLN
500.0000 mg | Freq: Two times a day (BID) | INTRAVENOUS | Status: DC
  Administered 2024-08-08 – 2024-08-11 (×7): 500 mg via INTRAVENOUS
  Filled 2024-08-08 (×7): qty 100

## 2024-08-08 MED ORDER — VANCOMYCIN VARIABLE DOSE PER UNSTABLE RENAL FUNCTION (PHARMACIST DOSING): Status: DC

## 2024-08-08 MED ORDER — SODIUM CHLORIDE 0.9 % IV SOLN
2.0000 g | INTRAVENOUS | Status: DC
  Administered 2024-08-08 – 2024-08-10 (×3): 2 g via INTRAVENOUS
  Filled 2024-08-08 (×3): qty 12.5

## 2024-08-08 NOTE — Plan of Care (Signed)
  Problem: Education: Goal: Ability to describe self-care measures that may prevent or decrease complications (Diabetes Survival Skills Education) will improve Outcome: Progressing   Problem: Fluid Volume: Goal: Ability to maintain a balanced intake and output will improve Outcome: Progressing   Problem: Metabolic: Goal: Ability to maintain appropriate glucose levels will improve Outcome: Progressing   Problem: Skin Integrity: Goal: Risk for impaired skin integrity will decrease Outcome: Progressing

## 2024-08-08 NOTE — Progress Notes (Signed)
 PROGRESS NOTE    Alfred Carney  FMW:[id number] DOB: September 02, 1951 DOA: 08/04/2024 PCP: Corwin Antu, FNP  Chronically ill 73/M with history of COPD, chronic hypoxic respiratory failure on 2 L [place], chronic diastolic HF, HTN, HLD, CKD 3A, class III obesity, T2DM, OSA, GERD, PAD s/p bypass, CAD, carotid stenosis and thoracoabdominal aneurysm s/p repair who presented to the ED for evaluation of abdominal pain. Patient reports 4 to 5 days of nagging, intermittent right lower quadrant pain.  He was seen by his PCP on 11/17 for evaluation and a CT abdomen pelvis with contrast was obtained 11/18 for further evaluation. Results showed evidence of inflammatory changes around the right limb of his bypass graft as well as thickening of his appendix concerning for infection. Patient was called to present to the ED, labs noted WBC of 15, creatinine 1.7, started on broad-spectrum antibiotics, seen by vascular surgery and general surgery in consultation   Subjective: Patient seen and examined at bedside today. Abdominal pain is managed with pain medications. Currently not in acute pain, denies nausea and vomiting.  Assessment and Plan:  Intra-abdominal infection, suspect appendicitis - Patient presented with 1 week of intermittent RLQ pain with associated nausea - CT abdomen pelvis from PCP office shows some inflammation around the right limb of the graft including some tethered small bowel, tip of the appendix, distal right ureter and concern for appendix inflammation  - General Surgery and vascular surgery following, continue IV cefepime ,  -DC ed Vanc/zosyn  due to worsening SCr -Felt to be a poor surgical candidate, recommended to continue broad-spectrum antibiotics for now with plan for repeat imaging in 2 to 3 days - Follow-up blood cultures, trend CBC.  Dose is slightly improved from 13.1-12.9 cultures no growth so far - General Surgery's recommendation is continue to treat conservatively with  antibiotics  PAD s/p bypass Hx of thoracoabdominal aneurysm - status post aortobifemoral bypass graft. Diffuse circumferential thickening and inflammatory changes along the right iliac limb of the bypass graft. There is infiltration of the surrounding fat plane.  - Vascular surgery following, recommending to broaden antibiotic coverage with vancomycin  along with Zosyn , if creatinine continues to improve vascular surgery is planning on doing a repeat CTA  - Continue pravastatin    T2DM with hyperglycemia - Last A1c 7.4% 1 month ago, blood sugar 194 on admission - SSI with meals, CBG monitoring   AKI on CKD 3A - Slight rise in creatinine to 2.20  from baseline of 1.2-1.5 - Monitor, hold ARB, aldactone , DC vanc/zosyn  -Started on cefepime  and flagyl    HTN - Stable - Continue , metoprolol , hold ARB   Chronic diastolic heart failure Chronic lower extremity swelling -Continue Lasix , metoprolol , and    COPD/Chronic hypoxic respiratory failure - Chronic and stable, no signs of COPD exacerbation - Remains on baseline 2 L [place], continue - As needed DuoNebs   Hx of PSVT - Continue metoprolol    # HLD # CAD - Continue pravastatin    # Gout - Continue allopurinol    # BPH - Continue tamsulosin    # OSA - Not on CPAP   # Class III obesity Body mass index is 40.63 kg/m.   DVT prophylaxis: Lovenox  Code Status: DNR Family Communication: Discussed with patient detail, no family at bedside Disposition Plan:   Consultants:    Procedures:   Antimicrobials:    Objective: Vitals:   08/07/24 1455 08/07/24 1654 08/07/24 2200 08/08/24 0808  BP: (!) 130/44 (!) 108/58 95/75 (!) 116/57  Pulse: 73 75 84 93  Resp:  18 16   Temp:  98.1 F (36.7 C) 98.2 F (36.8 C) 98.2 F (36.8 C)  TempSrc:  Oral Oral Oral  SpO2: 99% 99% 95% 99%  Weight:      Height:        Intake/Output Summary (Last 24 hours) at 08/08/2024 1208 Last data filed at 08/07/2024 1610 Gross per 24 hour   Intake 352.48 ml  Output --  Net 352.48 ml    Filed Weights   08/04/24 1238  Weight: 124.8 kg    Examination:  General exam: Obese chronically ill, AAO x 3 Respiratory system: decreased breath sounds at the bases Cardiovascular system: S1 & S2 heard, RRR.  Abd: Soft, mild right lower quadrant tenderness, bowel sounds present Central nervous system: Alert and oriented. No focal neurological deficits. Extremities: no edema Skin: No rashes Psychiatry:  Mood & affect appropriate.     Data Reviewed:   CBC: Recent Labs  Lab 08/02/24 1230 08/04/24 1244 08/05/24 0407 08/06/24 0512 08/07/24 0319 08/08/24 0914  WBC 14.3* 15.2* 13.1* 12.9* 12.9* 10.1  NEUTROABS 10.8* 11.8*  --   --  8.8*  --   HGB 13.1 13.3 13.7 11.8* 11.5* 10.6*  HCT 39.7 40.8 42.6 36.8* 35.1* 32.9*  MCV 85.6 87.2 87.5 87.8 86.2 87.0  PLT 370.0 338 316 285 316 286   Basic Metabolic Panel: Recent Labs  Lab 08/04/24 1244 08/05/24 0407 08/06/24 0512 08/07/24 0319 08/08/24 0229  NA 138 139 138 138 136  K 3.5 3.1* 4.2 3.7 3.6  CL 94* 98 99 97* 96*  CO2 26 27 25 24 28   GLUCOSE 194* 155* 147* 130* 158*  BUN 16 16 18 16 22   CREATININE 1.72* 1.73* 1.88* 2.01* 2.20*  CALCIUM  9.4 8.9 8.7* 8.8* 8.9   GFR: Estimated Creatinine Clearance: 38.5 mL/min (A) (by C-G formula based on SCr of 2.2 mg/dL (H)). Liver Function Tests: Recent Labs  Lab 08/02/24 1230 08/04/24 1244 08/05/24 0407 08/07/24 0319  AST 10 16 15 15   ALT 10 13 11 12   ALKPHOS 68 69 70 51  BILITOT 0.9 0.9 1.1 1.1  PROT 7.0 7.2 6.6 6.0*  ALBUMIN  3.7 3.0* 2.9* 2.5*   Recent Labs  Lab 08/02/24 1230  LIPASE 45.0  AMYLASE 35   No results for input(s): AMMONIA in the last 168 hours. Coagulation Profile: No results for input(s): INR, PROTIME in the last 168 hours. Cardiac Enzymes: No results for input(s): CKTOTAL, CKMB, CKMBINDEX, TROPONINI in the last 168 hours. BNP (last 3 results) Recent Labs    05/10/24 1224   PROBNP 89.0   HbA1C: No results for input(s): HGBA1C in the last 72 hours. CBG: Recent Labs  Lab 08/07/24 0730 08/07/24 1131 08/07/24 1652 08/07/24 2145 08/08/24 0805  GLUCAP 149* 170* 114* 163* 156*   Lipid Profile: No results for input(s): CHOL, HDL, LDLCALC, TRIG, CHOLHDL, LDLDIRECT in the last 72 hours. Thyroid  Function Tests: No results for input(s): TSH, T4TOTAL, FREET4, T3FREE, THYROIDAB in the last 72 hours. Anemia Panel: No results for input(s): VITAMINB12, FOLATE, FERRITIN, TIBC, IRON, RETICCTPCT in the last 72 hours. Urine analysis:    Component Value Date/Time   COLORURINE YELLOW 08/04/2024 1317   APPEARANCEUR CLEAR 08/04/2024 1317   LABSPEC 1.012 08/04/2024 1317   PHURINE 6.0 08/04/2024 1317   GLUCOSEU NEGATIVE 08/04/2024 1317   HGBUR NEGATIVE 08/04/2024 1317   BILIRUBINUR NEGATIVE 08/04/2024 1317   BILIRUBINUR negative 08/02/2024 1234   KETONESUR NEGATIVE 08/04/2024 1317   PROTEINUR NEGATIVE 08/04/2024 1317  UROBILINOGEN 0.2 08/02/2024 1234   UROBILINOGEN 1.0 05/05/2014 1706   NITRITE NEGATIVE 08/04/2024 1317   LEUKOCYTESUR NEGATIVE 08/04/2024 1317   Sepsis Labs: @LABRCNTIP (procalcitonin:4,lacticidven:4)  ) Recent Results (from the past 240 hours)  Blood culture (routine x 2)     Status: None (Preliminary result)   Collection Time: 08/04/24  5:22 PM   Specimen: BLOOD  Result Value Ref Range Status   Specimen Description BLOOD LEFT ANTECUBITAL  Final   Special Requests   Final    BOTTLES DRAWN AEROBIC ONLY Blood Culture results may not be optimal due to an inadequate volume of blood received in culture bottles   Culture   Final    NO GROWTH 3 DAYS Performed at [facility], [address], [place], [place] [address]    Report Status PENDING  Incomplete  Blood culture (routine x 2)     Status: None (Preliminary result)   Collection Time: 08/04/24  9:05 PM   Specimen: BLOOD  Result Value Ref Range Status    Specimen Description BLOOD SITE NOT SPECIFIED  Final   Special Requests   Final    BOTTLES DRAWN AEROBIC ONLY Blood Culture adequate volume   Culture   Final    NO GROWTH 3 DAYS Performed at [facility], [address], [place], [place] [address]    Report Status PENDING  Incomplete     Radiology Studies: No results found.    Scheduled Meds:  allopurinol   200 mg Oral Daily   enoxaparin  (LOVENOX ) injection  40 mg Subcutaneous Q24H   furosemide   40 mg Oral Daily   insulin  aspart  0-15 Units Subcutaneous TID WC   insulin  aspart  0-5 Units Subcutaneous QHS   metoprolol  tartrate  25 mg Oral Q1500   pantoprazole   40 mg Oral Daily   pravastatin   40 mg Oral q1800   tamsulosin   0.4 mg Oral QHS   Continuous Infusions:  ceFEPime  (MAXIPIME ) IV 2 g (08/08/24 1008)   metronidazole  500 mg (08/08/24 1050)   vancomycin  Stopped (08/07/24 1253)     LOS: 4 days    Time spent:    Landon Baller MD [facility]   08/08/2024, 12:08 PM

## 2024-08-08 NOTE — Plan of Care (Signed)

## 2024-08-08 NOTE — Progress Notes (Signed)
 Subjective/Chief Complaint: Patient about the same with occasional right lower quadrant abdominal pain.   Objective: Vital signs in last 24 hours: Temp:  [98.1 F (36.7 C)-98.2 F (36.8 C)] 98.2 F (36.8 C) (11/23 0808) Pulse Rate:  [73-93] 93 (11/23 0808) Resp:  [16-18] 16 (11/22 2200) BP: (95-130)/(44-75) 116/57 (11/23 0808) SpO2:  [95 %-99 %] 99 % (11/23 0808) Last BM Date : 08/05/24  Intake/Output from previous day: 11/22 0701 - 11/23 0700 In: 529.5 [P.O.:177; IV Piggyback:352.5] Out: -  Intake/Output this shift: No intake/output data recorded.  Abdomen: Soft nontender without rebound or guarding  Lab Results:  Recent Labs    08/07/24 0319 08/08/24 0914  WBC 12.9* 10.1  HGB 11.5* 10.6*  HCT 35.1* 32.9*  PLT 316 286   BMET Recent Labs    08/07/24 0319 08/08/24 0229  NA 138 136  K 3.7 3.6  CL 97* 96*  CO2 24 28  GLUCOSE 130* 158*  BUN 16 22  CREATININE 2.01* 2.20*  CALCIUM  8.8* 8.9   PT/INR No results for input(s): LABPROT, INR in the last 72 hours. ABG No results for input(s): PHART, HCO3 in the last 72 hours.  Invalid input(s): PCO2, PO2  Studies/Results: No results found.  Anti-infectives: Anti-infectives (From admission, onward)    Start     Dose/Rate Route Frequency Ordered Stop   08/08/24 1030  ceFEPIme  (MAXIPIME ) 2 g in sodium chloride  0.9 % 100 mL IVPB        2 g 200 mL/hr over 30 Minutes Intravenous Every 24 hours 08/08/24 0944     08/08/24 1030  metroNIDAZOLE  (FLAGYL ) IVPB 500 mg        500 mg 100 mL/hr over 60 Minutes Intravenous Every 12 hours 08/08/24 0944     08/07/24 1200  vancomycin  (VANCOCIN ) IVPB 1000 mg/200 mL premix        1,000 mg 200 mL/hr over 60 Minutes Intravenous Every 24 hours 08/07/24 0814     08/06/24 0930  vancomycin  (VANCOCIN ) IVPB 1000 mg/200 mL premix  Status:  Discontinued        1,000 mg 200 mL/hr over 60 Minutes Intravenous Every 24 hours 08/06/24 0839 08/07/24 0814   08/04/24 1730   vancomycin  (VANCOCIN ) 2,500 mg in sodium chloride  0.9 % 500 mL IVPB        2,500 mg 262.5 mL/hr over 120 Minutes Intravenous  Once 08/04/24 1717 08/04/24 2331   08/04/24 1730  piperacillin -tazobactam (ZOSYN ) IVPB 3.375 g  Status:  Discontinued        3.375 g 12.5 mL/hr over 240 Minutes Intravenous Every 8 hours 08/04/24 1718 08/08/24 0943   08/04/24 1715  vancomycin  (VANCOCIN ) IVPB 1000 mg/200 mL premix  Status:  Discontinued        1,000 mg 200 mL/hr over 60 Minutes Intravenous  Once 08/04/24 1711 08/04/24 1717       Assessment/Plan:  ohn Dombkowski is an 73 y.o. male with abdominal pain and concern for possible iliac limb/bypass graft infection with reactive inflammation of appendix versus primary appendicitis with resultant inflammation of iliac limb/graft.  - CT without obvious free air or free fluid. No obvious appendicolith. HDS. No peritonitis on exam. WBC continues to go down today. Would not recommend surgery at this time given above and CT showing tethering to his graft and involvement of ureter and several loops of small intestine making significant adhesions making the likelihood of needing a more extensive laparotomy with risk of injury to any and all of the structure more  likely.  -Would continue to observe with non-operative management on abx for now.  - Agree with broad spectrum antibiotics while awaiting blood cultures - Appreciate vascular input. They are tentatively planning repeat CT 11/22. - Okay soft diet. AM labs. -   FEN - FLD. If patient begins to have worsening pain or concerning vitals would make NPO VTE: SCDs, Lovenox  ID: Zosyn  Recommend repeating CT scan either Monday or Tuesday depending on renal function  Recommend consulting with vascular surgery about length of antibiotic treatment but recommend at least 10 to 14 days from a surgery standpoint  No acute surgical need at this point in time I reviewed nursing notes, Consultant (vascular) notes, hospitalist  notes, last 24 h vitals and pain scores, last 48 h intake and output, last 24 h labs and trends, and last 24 h imaging results.   LOS: 4 days    Debby DELENA Shipper MD 08/08/2024 Moderate complexity.

## 2024-08-08 NOTE — Progress Notes (Signed)
  Progress Note    08/08/2024 9:52 AM Hospital Day 2  Subjective: No abdominal pain this morning.  He denies breakfast.  No complaints.   Vitals:   08/07/24 2200 08/08/24 0808  BP: 95/75 (!) 116/57  Pulse: 84 93  Resp: 16   Temp: 98.2 F (36.8 C) 98.2 F (36.8 C)  SpO2: 95% 99%    Physical Exam: General:  no distress, obese Lungs:  non labored abdomen:  soft and non tender to palpation  CBC    Component Value Date/Time   WBC 10.1 08/08/2024 0914   RBC 3.78 (L) 08/08/2024 0914   HGB 10.6 (L) 08/08/2024 0914   HCT 32.9 (L) 08/08/2024 0914   PLT 286 08/08/2024 0914   MCV 87.0 08/08/2024 0914   MCH 28.0 08/08/2024 0914   MCHC 32.2 08/08/2024 0914   RDW 13.6 08/08/2024 0914   LYMPHSABS 2.9 08/07/2024 0319   MONOABS 1.0 08/07/2024 0319   EOSABS 0.1 08/07/2024 0319   BASOSABS 0.1 08/07/2024 0319    BMET    Component Value Date/Time   NA 136 08/08/2024 0229   NA 140 06/15/2024 0927   K 3.6 08/08/2024 0229   CL 96 (L) 08/08/2024 0229   CO2 28 08/08/2024 0229   GLUCOSE 158 (H) 08/08/2024 0229   BUN 22 08/08/2024 0229   BUN 22 06/15/2024 0927   CREATININE 2.20 (H) 08/08/2024 0229   CREATININE 1.16 01/13/2015 0936   CALCIUM  8.9 08/08/2024 0229   GFRNONAA 31 (L) 08/08/2024 0229   GFRNONAA 53 (L) 07/19/2014 1156   GFRAA >60 06/11/2020 0754   GFRAA 61 07/19/2014 1156    INR    Component Value Date/Time   INR 1.1 06/10/2020 0048     Intake/Output Summary (Last 24 hours) at 08/08/2024 9047 Last data filed at 08/07/2024 1610 Gross per 24 hour  Intake 529.48 ml  Output --  Net 529.48 ml     Assessment/Plan:  73 y.o. male  with hx of TAA repair 2004 now with abdominal pain with concern of inflammatory changes around the right limb of his bpg and possible appendix infection.   LOS day 4  Patient would benefit from rescan when able.  Currently, creatinine is increased to 2+.  Would hold at this time. Continues to have mild leukocytosis  Will continue  to follow

## 2024-08-09 DIAGNOSIS — B999 Unspecified infectious disease: Secondary | ICD-10-CM | POA: Diagnosis not present

## 2024-08-09 LAB — BASIC METABOLIC PANEL WITH GFR
Anion gap: 12 (ref 5–15)
BUN: 23 mg/dL (ref 8–23)
CO2: 30 mmol/L (ref 22–32)
Calcium: 8.7 mg/dL — ABNORMAL LOW (ref 8.9–10.3)
Chloride: 93 mmol/L — ABNORMAL LOW (ref 98–111)
Creatinine, Ser: 2.08 mg/dL — ABNORMAL HIGH (ref 0.61–1.24)
GFR, Estimated: 33 mL/min — ABNORMAL LOW (ref >60)
Glucose, Bld: 125 mg/dL — ABNORMAL HIGH (ref 70–99)
Potassium: 3.2 mmol/L — ABNORMAL LOW (ref 3.5–5.1)
Sodium: 135 mmol/L (ref 135–145)

## 2024-08-09 LAB — CBC
HCT: 34.6 % — ABNORMAL LOW (ref 39.0–52.0)
Hemoglobin: 11.2 g/dL — ABNORMAL LOW (ref 13.0–17.0)
MCH: 27.9 pg (ref 26.0–34.0)
MCHC: 32.4 g/dL (ref 30.0–36.0)
MCV: 86.3 fL (ref 80.0–100.0)
Platelets: 321 K/uL (ref 150–400)
RBC: 4.01 MIL/uL — ABNORMAL LOW (ref 4.22–5.81)
RDW: 13.4 % (ref 11.5–15.5)
WBC: 8.2 K/uL (ref 4.0–10.5)
nRBC: 0 % (ref 0.0–0.2)

## 2024-08-09 LAB — CULTURE, BLOOD (ROUTINE X 2)
Culture: NO GROWTH
Culture: NO GROWTH
Special Requests: ADEQUATE

## 2024-08-09 LAB — GLUCOSE, CAPILLARY
Glucose-Capillary: 146 mg/dL — ABNORMAL HIGH (ref 70–99)
Glucose-Capillary: 148 mg/dL — ABNORMAL HIGH (ref 70–99)
Glucose-Capillary: 165 mg/dL — ABNORMAL HIGH (ref 70–99)
Glucose-Capillary: 143 mg/dL — ABNORMAL HIGH (ref 70–99)
Glucose-Capillary: 170 mg/dL — ABNORMAL HIGH (ref 70–99)

## 2024-08-09 MED ORDER — ENOXAPARIN SODIUM 60 MG/0.6ML IJ SOSY
60.0000 mg | PREFILLED_SYRINGE | INTRAMUSCULAR | Status: DC
  Administered 2024-08-09 – 2024-08-11 (×3): 60 mg via SUBCUTANEOUS
  Filled 2024-08-09 (×4): qty 0.6

## 2024-08-09 MED ORDER — POTASSIUM CHLORIDE CRYS ER 20 MEQ PO TBCR
40.0000 meq | EXTENDED_RELEASE_TABLET | Freq: Once | ORAL | Status: AC
  Administered 2024-08-09: 40 meq via ORAL
  Filled 2024-08-09: qty 2

## 2024-08-09 NOTE — Progress Notes (Signed)
 Progress Note     Subjective: Pt reports poor appetite. Having bowel function. Some RLQ pain intermittently.   Objective: Vital signs in last 24 hours: Temp:  [97.5 F (36.4 C)-98.6 F (37 C)] 97.5 F (36.4 C) (11/24 0753) Pulse Rate:  [59-92] 85 (11/24 0753) Resp:  [17-19] 19 (11/24 0753) BP: (108-126)/(49-70) 120/54 (11/24 0753) SpO2:  [95 %-100 %] 95 % (11/24 0753) Last BM Date : 08/05/24  Intake/Output from previous day: 11/23 0701 - 11/24 0700 In: 383.6 [IV Piggyback:383.6] Out: -  Intake/Output this shift: No intake/output data recorded.  PE: General: pleasant, WD, obese male who is laying in bed in NAD Heart: regular, rate, and rhythm.  Lungs:  Respiratory effort nonlabored Abd: soft,mild ttp of RLQ without peritonitis, obese Psych: A&Ox3 with an appropriate affect.    Lab Results:  Recent Labs    08/08/24 0914 08/09/24 0424  WBC 10.1 8.2  HGB 10.6* 11.2*  HCT 32.9* 34.6*  PLT 286 321   BMET Recent Labs    08/08/24 0229 08/09/24 0424  NA 136 135  K 3.6 3.2*  CL 96* 93*  CO2 28 30  GLUCOSE 158* 125*  BUN 22 23  CREATININE 2.20* 2.08*  CALCIUM  8.9 8.7*   PT/INR No results for input(s): LABPROT, INR in the last 72 hours. CMP     Component Value Date/Time   NA 135 08/09/2024 0424   NA 140 06/15/2024 0927   K 3.2 (L) 08/09/2024 0424   CL 93 (L) 08/09/2024 0424   CO2 30 08/09/2024 0424   GLUCOSE 125 (H) 08/09/2024 0424   BUN 23 08/09/2024 0424   BUN 22 06/15/2024 0927   CREATININE 2.08 (H) 08/09/2024 0424   CREATININE 1.16 01/13/2015 0936   CALCIUM  8.7 (L) 08/09/2024 0424   PROT 6.0 (L) 08/07/2024 0319   ALBUMIN  2.5 (L) 08/07/2024 0319   AST 15 08/07/2024 0319   ALT 12 08/07/2024 0319   ALKPHOS 51 08/07/2024 0319   BILITOT 1.1 08/07/2024 0319   GFRNONAA 33 (L) 08/09/2024 0424   GFRNONAA 53 (L) 07/19/2014 1156   GFRAA >60 06/11/2020 0754   GFRAA 61 07/19/2014 1156   Lipase     Component Value Date/Time   LIPASE 45.0  08/02/2024 1230       Studies/Results: No results found.  Anti-infectives: Anti-infectives (From admission, onward)    Start     Dose/Rate Route Frequency Ordered Stop   08/08/24 1331  vancomycin  variable dose per unstable renal function (pharmacist dosing)  Status:  Discontinued         Does not apply See admin instructions 08/08/24 1331 08/09/24 1016   08/08/24 1030  ceFEPIme  (MAXIPIME ) 2 g in sodium chloride  0.9 % 100 mL IVPB        2 g 200 mL/hr over 30 Minutes Intravenous Every 24 hours 08/08/24 0944     08/08/24 1030  metroNIDAZOLE  (FLAGYL ) IVPB 500 mg        500 mg 100 mL/hr over 60 Minutes Intravenous Every 12 hours 08/08/24 0944     08/07/24 1200  vancomycin  (VANCOCIN ) IVPB 1000 mg/200 mL premix  Status:  Discontinued        1,000 mg 200 mL/hr over 60 Minutes Intravenous Every 24 hours 08/07/24 0814 08/08/24 1331   08/06/24 0930  vancomycin  (VANCOCIN ) IVPB 1000 mg/200 mL premix  Status:  Discontinued        1,000 mg 200 mL/hr over 60 Minutes Intravenous Every 24 hours 08/06/24 0839 08/07/24 9185  08/04/24 1730  vancomycin  (VANCOCIN ) 2,500 mg in sodium chloride  0.9 % 500 mL IVPB        2,500 mg 262.5 mL/hr over 120 Minutes Intravenous  Once 08/04/24 1717 08/04/24 2331   08/04/24 1730  piperacillin -tazobactam (ZOSYN ) IVPB 3.375 g  Status:  Discontinued        3.375 g 12.5 mL/hr over 240 Minutes Intravenous Every 8 hours 08/04/24 1718 08/08/24 0943   08/04/24 1715  vancomycin  (VANCOCIN ) IVPB 1000 mg/200 mL premix  Status:  Discontinued        1,000 mg 200 mL/hr over 60 Minutes Intravenous  Once 08/04/24 1711 08/04/24 1717        Assessment/Plan Abdominal pain and concern for possible iliac limb/bypass graft infection with reactive inflammation of appendix versus primary appendicitis with resultant inflammation of iliac limb/graft.  - CT 11/18 without obvious free air or free fluid. No obvious appendicolith. HDS. No peritonitis on exam. WBC continues to go down today.  Would not recommend surgery at this time given above and CT showing tethering to his graft and involvement of ureter and several loops of small intestine making significant adhesions making the likelihood of needing a more extensive laparotomy with risk of injury to any and all of the structure more likely.  -Would continue to observe with non-operative management on abx for now.  - Agree with broad spectrum antibiotics, blood Cxs from 11/19 with NGTD. - Okay soft diet. Trend labs - agree with CT tomorrow, with IV contrast if Cr doing better or without if Cr not improving. Would recommend PO contrast regardless    FEN - soft diet  VTE: SCDs, Lovenox  ID: [email or web address] /flagyl   - per TRH - Hx of thoracoabdominal aortic aneurysm s/p bypass graft PAD CAD HTN HLD Hx of SVT AKI on CKD IIIa Chronic diastolic CHF COPD BPH T2DM OSA Morbid Obesity     LOS: 5 days   I reviewed Consultant vascular notes, hospitalist notes, last 24 h vitals and pain scores, last 48 h intake and output, last 24 h labs and trends, and last 24 h imaging results.  This care required moderate level of medical decision making.    Burnard JONELLE Louder, [facility] 08/09/2024, 11:55 AM Please see Amion for pager number during day hours 7:00am-4:30pm

## 2024-08-09 NOTE — Plan of Care (Signed)

## 2024-08-09 NOTE — Progress Notes (Signed)
 Physical Therapy Treatment Patient Details Name: Alfred Carney MRN: [id number] DOB: 10/22/1950 Today's Date: 08/09/2024   History of Present Illness Pt is a 73 y.o. M who presents 08/04/2024 with abdominal pain and concern of inflammatory changes around the right limb of his bpg and possible appendix infection.  CT without obvious free air or free fluid. Not recommending surgery at this time; plan for conservative management on abx for now. Significant PMH includes: TAA repair 2044, NIDDM, PAD, HLD, HTN, history of gout, BPH, COPD (2L at baseline), HFpEF.  Pt reports hx of orthostatic hypotension recently causing leg weakness and lightheadedness    PT Comments  Pt making gradual progress.  He did have soft BP in sitting but some improvement after walking to and from bathroom. Pt then able to progress to hallway ambulation with rest breaks.  Cont POC.  Recommend HHPT at d/c.     If plan is discharge home, recommend the following: A little help with walking and/or transfers;A little help with bathing/dressing/bathroom;Help with stairs or ramp for entrance   Can travel by private vehicle        Equipment Recommendations  None recommended by PT    Recommendations for Other Services       Precautions / Restrictions Precautions Precautions: Fall     Mobility  Bed Mobility Overal bed mobility: Needs Assistance Bed Mobility: Supine to Sit, Sit to Supine     Supine to sit: Supervision Sit to supine: Supervision        Transfers Overall transfer level: Needs assistance Equipment used: Rolling walker (2 wheels) Transfers: Sit to/from Stand Sit to Stand: Contact guard assist           General transfer comment: CGA for safety; STS x 2 from bed and x 1 from toilet x 1 from recliner    Ambulation/Gait Ambulation/Gait assistance: Contact guard assist Gait Distance (Feet): 120 Feet (10'x2, 120' x 2) Assistive device: Rolling walker (2 wheels) Gait Pattern/deviations: Decreased  stride length, Step-through pattern, Trunk flexed Gait velocity: decreased     General Gait Details: Min cues for RW proximity; chair placed in hallway and stayed close for rest breaks as needed ; reports limited by leg weakness   Stairs             Wheelchair Mobility     Tilt Bed    Modified Rankin (Stroke Patients Only)       Balance Overall balance assessment: Needs assistance Sitting-balance support: No upper extremity supported Sitting balance-Leahy Scale: Good     Standing balance support: Bilateral upper extremity supported, Reliant on assistive device for balance Standing balance-Leahy Scale: Poor Standing balance comment: Steady with RW                            Communication    Cognition Arousal: Alert Behavior During Therapy: WFL for tasks assessed/performed   PT - Cognitive impairments: No apparent impairments                                Cueing    Exercises      General Comments General comments (skin integrity, edema, etc.): Pt reports hx of orthostatic bp lately (6 months) with legs getting weak.  BP was 96/77 upon sitting with mild symptoms.  Sat several mins before standing.   Attempted to get in standing but initial BP did not take and pt  needing to go to bathroom, denies symptoms.  Walked to and from bathroom with BP 124/77 upon return.  Did educate pt on orthostatic hypotension compensation techniques - slow transitions, exercises in sitting prior to standing, return to sitting should symptoms occur.      Pertinent Vitals/Pain Pain Assessment Pain Assessment: No/denies pain    Home Living                          Prior Function            PT Goals (current goals can now be found in the care plan section) Progress towards PT goals: Progressing toward goals    Frequency    Min 2X/week      PT Plan      Co-evaluation              AM-PAC PT 6 Clicks Mobility   Outcome  Measure  Help needed turning from your back to your side while in a flat bed without using bedrails?: A Little Help needed moving from lying on your back to sitting on the side of a flat bed without using bedrails?: A Little Help needed moving to and from a bed to a chair (including a wheelchair)?: A Little Help needed standing up from a chair using your arms (e.g., wheelchair or bedside chair)?: A Little Help needed to walk in hospital room?: A Little Help needed climbing 3-5 steps with a railing? : A Lot 6 Click Score: 17    End of Session Equipment Utilized During Treatment: Gait belt;Oxygen  (on 2 L O2 sats >96%) Activity Tolerance: Patient tolerated treatment well Patient left: with call bell/phone within reach;in bed;with bed alarm set Nurse Communication: Mobility status PT Visit Diagnosis: Unsteadiness on feet (R26.81);Other abnormalities of gait and mobility (R26.89);Muscle weakness (generalized) (M62.81);Difficulty in walking, not elsewhere classified (R26.2)     Time: 8893-8860 PT Time Calculation (min) (ACUTE ONLY): 33 min  Charges:    $Gait Training: 8-22 mins $Therapeutic Activity: 8-22 mins PT General Charges $$ ACUTE PT VISIT: 1 Visit                     Benjiman, PT Acute Rehab Services [facility] [phone]    Benjiman VEAR Mulberry 08/09/2024, 12:06 PM

## 2024-08-09 NOTE — Progress Notes (Signed)
 PROGRESS NOTE    Alfred Carney  FMW:[id number] DOB: 10/16/1950 DOA: 08/04/2024 PCP: Corwin Antu, FNP  Chronically ill 73/M with history of COPD, chronic hypoxic respiratory failure on 2 L [place], chronic diastolic HF, HTN, HLD, CKD 3A, class III obesity, T2DM, OSA, GERD, PAD s/p bypass, CAD, carotid stenosis and thoracoabdominal aneurysm s/p repair who presented to the ED for evaluation of abdominal pain. Patient reports 4 to 5 days of nagging, intermittent right lower quadrant pain.  He was seen by his PCP on 11/17 for evaluation and a CT abdomen pelvis with contrast was obtained 11/18 for further evaluation. Results showed evidence of inflammatory changes around the right limb of his bypass graft as well as thickening of his appendix concerning for infection. Patient was called to present to the ED, labs noted WBC of 15, creatinine 1.7, started on broad-spectrum antibiotics, seen by vascular surgery and general surgery in consultation   Subjective: Patient seen and examined at bedside today.  Reports having intermittent right lower quadrant pain last night. Denies acute pain at the time of my encounter. Denies nausea or vomiting.  Assessment and Plan:  Intra-abdominal infection, suspect appendicitis - Patient presented with 1 week of intermittent RLQ pain with associated nausea - CT abdomen pelvis from PCP office shows some inflammation around the right limb of the graft including some tethered small bowel, tip of the appendix, distal right ureter and concern for appendix inflammation  - General Surgery and vascular surgery following,  -Plan to continue IV abx with cefepime , Flagyl   -DC ed Vanc/zosyn  due to worsening SCr -Felt to be a poor surgical candidate, recommended to continue broad-spectrum antibiotics for now with plan for repeat imaging likely tomorrow if Scr improves - Negative blood cultures, WBC is normal.   - General Surgery's recommendation is continue to treat conservatively  with antibiotics  PAD s/p bypass Hx of thoracoabdominal aneurysm - status post aortobifemoral bypass graft. Diffuse circumferential thickening and inflammatory changes along the right iliac limb of the bypass graft. There is infiltration of the surrounding fat plane.  - Vascular surgery following, recommending to broaden antibiotic coverage with vancomycin  along with Zosyn , however due to worsening Scr they were discontinued and he is on Cefepime  and Flagyl .  -if creatinine continues to improve vascular surgery is planning on doing a repeat CTA  - Continue pravastatin    T2DM with hyperglycemia - Last A1c 7.4% 1 month ago, blood sugar 194 on admission - SSI with meals, CBG monitoring   AKI on CKD 3A - Slight rise in creatinine to 2.20  from baseline of 1.2-1.5 - Monitor, hold ARB, aldactone , DC vanc/zosyn  -Started on cefepime  and flagyl    HTN - Stable - Continue , metoprolol , hold ARB   Chronic diastolic heart failure Chronic lower extremity swelling -Continue Lasix , metoprolol , and    COPD/Chronic hypoxic respiratory failure - Chronic and stable, no signs of COPD exacerbation - Remains on baseline 2 L [place], continue - As needed DuoNebs   Hx of PSVT - Continue metoprolol    # HLD # CAD - Continue pravastatin    # Gout - Continue allopurinol    # BPH - Continue tamsulosin    # OSA - Not on CPAP   # Class III obesity Body mass index is 40.63 kg/m.   DVT prophylaxis: Lovenox  Code Status: DNR Family Communication: Discussed with patient detail, no family at bedside Disposition Plan:   Consultants:    Procedures:   Antimicrobials:    Objective: Vitals:   08/08/24 1525 08/08/24 2000 08/09/24  0401 08/09/24 0753  BP: 111/61 (!) 124/49 126/70 (!) 120/54  Pulse: 92 (!) 59 87 85  Resp: 18 17 18 19   Temp: 98.6 F (37 C) (!) 97.5 F (36.4 C) 98 F (36.7 C) (!) 97.5 F (36.4 C)  TempSrc:  Oral Oral Oral  SpO2: 98% 100% 98% 95%  Weight:      Height:         Intake/Output Summary (Last 24 hours) at 08/09/2024 1255 Last data filed at 08/08/2024 1504 Gross per 24 hour  Intake 383.58 ml  Output --  Net 383.58 ml    Filed Weights   08/04/24 1238  Weight: 124.8 kg    Examination:  General exam: Obese chronically ill, AAO x 3 Respiratory system: decreased breath sounds at the bases Cardiovascular system: S1 & S2 heard, RRR.  Abd: Soft, mild right lower quadrant tenderness, bowel sounds present Central nervous system: Alert and oriented. No focal neurological deficits. Extremities: no edema Skin: No rashes Psychiatry:  Mood & affect appropriate.     Data Reviewed:   CBC: Recent Labs  Lab 08/04/24 1244 08/05/24 0407 08/06/24 0512 08/07/24 0319 08/08/24 0914 08/09/24 0424  WBC 15.2* 13.1* 12.9* 12.9* 10.1 8.2  NEUTROABS 11.8*  --   --  8.8*  --   --   HGB 13.3 13.7 11.8* 11.5* 10.6* 11.2*  HCT 40.8 42.6 36.8* 35.1* 32.9* 34.6*  MCV 87.2 87.5 87.8 86.2 87.0 86.3  PLT 338 316 285 316 286 321   Basic Metabolic Panel: Recent Labs  Lab 08/05/24 0407 08/06/24 0512 08/07/24 0319 08/08/24 0229 08/09/24 0424  NA 139 138 138 136 135  K 3.1* 4.2 3.7 3.6 3.2*  CL 98 99 97* 96* 93*  CO2 27 25 24 28 30   GLUCOSE 155* 147* 130* 158* 125*  BUN 16 18 16 22 23   CREATININE 1.73* 1.88* 2.01* 2.20* 2.08*  CALCIUM  8.9 8.7* 8.8* 8.9 8.7*   GFR: Estimated Creatinine Clearance: 40.7 mL/min (A) (by C-G formula based on SCr of 2.08 mg/dL (H)). Liver Function Tests: Recent Labs  Lab 08/04/24 1244 08/05/24 0407 08/07/24 0319  AST 16 15 15   ALT 13 11 12   ALKPHOS 69 70 51  BILITOT 0.9 1.1 1.1  PROT 7.2 6.6 6.0*  ALBUMIN  3.0* 2.9* 2.5*   No results for input(s): LIPASE, AMYLASE in the last 168 hours.  No results for input(s): AMMONIA in the last 168 hours. Coagulation Profile: No results for input(s): INR, PROTIME in the last 168 hours. Cardiac Enzymes: No results for input(s): CKTOTAL, CKMB, CKMBINDEX,  TROPONINI in the last 168 hours. BNP (last 3 results) Recent Labs    05/10/24 1224  PROBNP 89.0   HbA1C: No results for input(s): HGBA1C in the last 72 hours. CBG: Recent Labs  Lab 08/08/24 2043 08/08/24 2353 08/09/24 0400 08/09/24 0757 08/09/24 1156  GLUCAP 176* 177* 146* 148* 165*   Lipid Profile: No results for input(s): CHOL, HDL, LDLCALC, TRIG, CHOLHDL, LDLDIRECT in the last 72 hours. Thyroid  Function Tests: No results for input(s): TSH, T4TOTAL, FREET4, T3FREE, THYROIDAB in the last 72 hours. Anemia Panel: No results for input(s): VITAMINB12, FOLATE, FERRITIN, TIBC, IRON, RETICCTPCT in the last 72 hours. Urine analysis:    Component Value Date/Time   COLORURINE YELLOW 08/04/2024 1317   APPEARANCEUR CLEAR 08/04/2024 1317   LABSPEC 1.012 08/04/2024 1317   PHURINE 6.0 08/04/2024 1317   GLUCOSEU NEGATIVE 08/04/2024 1317   HGBUR NEGATIVE 08/04/2024 1317   BILIRUBINUR NEGATIVE 08/04/2024 1317  BILIRUBINUR negative 08/02/2024 1234   KETONESUR NEGATIVE 08/04/2024 1317   PROTEINUR NEGATIVE 08/04/2024 1317   UROBILINOGEN 0.2 08/02/2024 1234   UROBILINOGEN 1.0 05/05/2014 1706   NITRITE NEGATIVE 08/04/2024 1317   LEUKOCYTESUR NEGATIVE 08/04/2024 1317   Sepsis Labs: @LABRCNTIP (procalcitonin:4,lacticidven:4)  ) Recent Results (from the past 240 hours)  Blood culture (routine x 2)     Status: None   Collection Time: 08/04/24  5:22 PM   Specimen: BLOOD  Result Value Ref Range Status   Specimen Description BLOOD LEFT ANTECUBITAL  Final   Special Requests   Final    BOTTLES DRAWN AEROBIC ONLY Blood Culture results may not be optimal due to an inadequate volume of blood received in culture bottles   Culture   Final    NO GROWTH 5 DAYS Performed at [facility], [address], [place], [place] [address]    Report Status 08/09/2024 FINAL  Final  Blood culture (routine x 2)     Status: None   Collection Time: 08/04/24  9:05 PM    Specimen: BLOOD  Result Value Ref Range Status   Specimen Description BLOOD SITE NOT SPECIFIED  Final   Special Requests   Final    BOTTLES DRAWN AEROBIC ONLY Blood Culture adequate volume   Culture   Final    NO GROWTH 5 DAYS Performed at [facility], [address] [place], [place] [address]    Report Status 08/09/2024 FINAL  Final     Radiology Studies: No results found.    Scheduled Meds:  allopurinol   200 mg Oral Daily   enoxaparin  (LOVENOX ) injection  60 mg Subcutaneous Q24H   insulin  aspart  0-15 Units Subcutaneous TID WC   insulin  aspart  0-5 Units Subcutaneous QHS   metoprolol  tartrate  25 mg Oral Q1500   pantoprazole   40 mg Oral Daily   pravastatin   40 mg Oral q1800   tamsulosin   0.4 mg Oral QHS   Continuous Infusions:  ceFEPime  (MAXIPIME ) IV 2 g (08/09/24 1142)   metronidazole  500 mg (08/09/24 1222)     LOS: 5 days    Time spent:    Landon Baller MD [facility]   08/09/2024, 12:55 PM

## 2024-08-09 NOTE — Plan of Care (Signed)

## 2024-08-09 NOTE — Plan of Care (Signed)
  Problem: Coping: Goal: Ability to adjust to condition or change in health will improve Outcome: Progressing   Problem: Fluid Volume: Goal: Ability to maintain a balanced intake and output will improve Outcome: Progressing   Problem: Activity: Goal: Risk for activity intolerance will decrease Outcome: Progressing

## 2024-08-09 NOTE — Progress Notes (Addendum)
  Progress Note    08/09/2024 8:03 AM * No surgery found *  Subjective:  doing okay this morning. Says presently some mild RLQ pain, had more intense episode of pain last night. Mild nausea. No vomiting. Overall he says he feels about the same   Vitals:   08/09/24 0401 08/09/24 0753  BP: 126/70 (!) 120/54  Pulse: 87 85  Resp: 18 19  Temp: 98 F (36.7 C) (!) 97.5 F (36.4 C)  SpO2: 98% 95%   Physical Exam: Cardiac:  regular Lungs:  non labored Abdomen:  obese, soft, non distended, mild tenderness in RLQ, no guarding, no rebound tenderness Neurologic: alert and oriented  CBC    Component Value Date/Time   WBC 8.2 08/09/2024 0424   RBC 4.01 (L) 08/09/2024 0424   HGB 11.2 (L) 08/09/2024 0424   HCT 34.6 (L) 08/09/2024 0424   PLT 321 08/09/2024 0424   MCV 86.3 08/09/2024 0424   MCH 27.9 08/09/2024 0424   MCHC 32.4 08/09/2024 0424   RDW 13.4 08/09/2024 0424   LYMPHSABS 2.9 08/07/2024 0319   MONOABS 1.0 08/07/2024 0319   EOSABS 0.1 08/07/2024 0319   BASOSABS 0.1 08/07/2024 0319    BMET    Component Value Date/Time   NA 135 08/09/2024 0424   NA 140 06/15/2024 0927   K 3.2 (L) 08/09/2024 0424   CL 93 (L) 08/09/2024 0424   CO2 30 08/09/2024 0424   GLUCOSE 125 (H) 08/09/2024 0424   BUN 23 08/09/2024 0424   BUN 22 06/15/2024 0927   CREATININE 2.08 (H) 08/09/2024 0424   CREATININE 1.16 01/13/2015 0936   CALCIUM  8.7 (L) 08/09/2024 0424   GFRNONAA 33 (L) 08/09/2024 0424   GFRNONAA 53 (L) 07/19/2014 1156   GFRAA >60 06/11/2020 0754   GFRAA 61 07/19/2014 1156    INR    Component Value Date/Time   INR 1.1 06/10/2020 0048     Intake/Output Summary (Last 24 hours) at 08/09/2024 0803 Last data filed at 08/08/2024 1504 Gross per 24 hour  Intake 383.58 ml  Output --  Net 383.58 ml     Assessment/Plan:  73 y.o. male  with hx of TAA repair 2004 now with abdominal pain with concern of inflammatory changes around the right limb of his bpg and possible appendix  infection.    RLQ abdominal pain comes and goes, unchanged. Episode last night Scr remains 2+ this morning Hopeful to re scan with CT tomorrow On Abx. Afebrile. Leukocytosis resolved Vascular will continue to follow  Teretha Damme, PA-C [facility] [phone] 08/09/2024 8:03 AM  I have seen and evaluated the patient. I agree with the PA note as documented above.  Still having some intermittent abdominal pain.  Nontoxic-appearing.  Blood cultures no growth to date.  No fevers.  White blood cell count normalized.  Hopefully repeat CT tomorrow with contrast if Cr improves -  otherwise will need Noncontrast CT tomorrow of abdomen pelvis.  Lonni DOROTHA Gaskins, MD [facility] [place] Office: [phone]

## 2024-08-09 NOTE — TOC Initial Note (Addendum)
 Transition of Care [facility]) - Initial/Assessment Note    Patient Details  Name: Alfred Carney MRN: [id number] Date of Birth: 10-25-1950  [facility]) CM/SW Contact:    Roxie KANDICE Stain, RN Phone Number: 08/09/2024, 2:46 PM  Clinical Narrative:                  Spoke to patient regarding transition needs.  Patient declines OP rehab and home health.  Patient has all the needed DME. Patient has home 02 from [employer]. ICM (Inpatient Care Management) will continue to follow for needs.  Expected Discharge Plan: Home/Self Care Barriers to Discharge: Continued Medical Work up   Patient Goals and CMS Choice Patient states their goals for this hospitalization and ongoing recovery are:: return home          Expected Discharge Plan and Services   Discharge Planning Services: CM Consult   Living arrangements for the past 2 months: Single Family Home                                      Prior Living Arrangements/Services Living arrangements for the past 2 months: Single Family Home Lives with:: Spouse Patient language and need for interpreter reviewed:: Yes Do you feel safe going back to the place where you live?: Yes      Need for Family Participation in Patient Care: Yes (Comment) Care giver support system in place?: Yes (comment) Current home services: DME (walker, wheelchair, rollator) Criminal Activity/Legal Involvement Pertinent to Current Situation/Hospitalization: No - Comment as needed  Activities of Daily Living   ADL Screening (condition at time of admission) Independently performs ADLs?: No Does the patient have a NEW difficulty with bathing/dressing/toileting/self-feeding that is expected to last >3 days?: No Does the patient have a NEW difficulty with getting in/out of bed, walking, or climbing stairs that is expected to last >3 days?: No Does the patient have a NEW difficulty with communication that is expected to last >3 days?: No Is the patient  deaf or have difficulty hearing?: Yes Does the patient have difficulty seeing, even when wearing glasses/contacts?: No Does the patient have difficulty concentrating, remembering, or making decisions?: No  Permission Sought/Granted                  Emotional Assessment Appearance:: Appears stated age Attitude/Demeanor/Rapport: Engaged Affect (typically observed): Accepting Orientation: : Oriented to Self, Oriented to Place, Oriented to  Time, Oriented to Situation Alcohol / Substance Use: Not Applicable Psych Involvement: No (comment)  Admission diagnosis:  Intra-abdominal infection [B99.9] Patient Active Problem List   Diagnosis Date Noted   Type 2 diabetes mellitus with hyperglycemia, without long-term current use of insulin  (HCC) 08/05/2024   History of repair of thoracoabdominal aortic aneurysm (TAAA) 08/05/2024   Acute renal failure superimposed on stage 3a chronic kidney disease (HCC) 08/05/2024   Chronic obstructive pulmonary disease (HCC) 08/05/2024   Intra-abdominal infection 08/04/2024   Type 2 diabetes mellitus with hypoglycemia without coma, with long-term current use of insulin  (HCC) 04/09/2024   Hyponatremia 04/09/2024   Type 2 diabetes mellitus with stage 2 chronic kidney disease, without long-term current use of insulin  (HCC) 04/09/2024   Physical deconditioning 04/09/2024   Generalized weakness 04/09/2024   Paroxysmal atrial tachycardia 03/13/2024   Acute on chronic congestive heart failure (HCC) 03/11/2024   CKD stage 3a, GFR 45-59 ml/min (HCC) 03/11/2024   Polyarthralgia 05/20/2023   Pedal edema  05/20/2023   History of gout 05/20/2023   BPH with obstruction/lower urinary tract symptoms 10/10/2022   Obesity, Class III, BMI 40-49.9 (morbid obesity) (HCC) 10/10/2022   Psoriasis 10/10/2022   Degenerative arthritis of hip 05/08/2022   Hernia, hiatal 05/08/2022   Hypertension associated with diabetes (HCC) 05/08/2022   Hyperlipidemia associated with type 2  diabetes mellitus (HCC) 05/08/2022   Venous stasis dermatitis of both lower extremities 01/30/2021   Chronic diastolic CHF (congestive heart failure) (HCC) 06/22/2020   Posterior reversible encephalopathy syndrome 06/11/2020   Leukocytosis 10/21/2019   Chronic hypoxic respiratory failure (HCC) 02/05/2019   Solitary pulmonary nodule on lung CT 01/29/2018   Bilateral hearing loss 03/12/2017   Angina pectoris 03/12/2017   Vitamin D  deficiency 08/01/2014   CAD in native artery 09/25/2011   PAD (peripheral artery disease) 09/25/2011   Hyperlipidemia 09/25/2011   ABDOMINAL AORTIC ANEURYSM, HX OF 06/30/2007   COPD GOLD II 06/30/2007   PCP:  Corwin Antu, FNP Pharmacy:   [facility] [address] [place] - [address] [place] [address] Phone: [phone] Fax: [phone]     Social Drivers of Health (SDOH) Social History: SDOH Screenings   Food Insecurity: No Food Insecurity (08/05/2024)  Housing: Low Risk  (08/05/2024)  Transportation Needs: No Transportation Needs (08/05/2024)  Utilities: Not At Risk (08/05/2024)  Alcohol Screen: Low Risk  (05/19/2024)  Depression (PHQ2-9): Medium Risk (06/30/2024)  Financial Resource Strain: Low Risk  (05/19/2024)  Physical Activity: Inactive (05/19/2024)  Social Connections: Moderately Integrated (08/05/2024)  Stress: No Stress Concern Present (05/19/2024)  Tobacco Use: Medium Risk (08/04/2024)  Health Literacy: Adequate Health Literacy (05/19/2024)   SDOH Interventions:     Readmission Risk Interventions     No data to display

## 2024-08-10 ENCOUNTER — Inpatient Hospital Stay (HOSPITAL_COMMUNITY)

## 2024-08-10 DIAGNOSIS — B999 Unspecified infectious disease: Secondary | ICD-10-CM | POA: Diagnosis not present

## 2024-08-10 LAB — MAGNESIUM: Magnesium: 1.7 mg/dL (ref 1.7–2.4)

## 2024-08-10 LAB — PHOSPHORUS: Phosphorus: 2.9 mg/dL (ref 2.5–4.6)

## 2024-08-10 LAB — CBC
HCT: 36.9 % — ABNORMAL LOW (ref 39.0–52.0)
Hemoglobin: 12 g/dL — ABNORMAL LOW (ref 13.0–17.0)
MCH: 27.9 pg (ref 26.0–34.0)
MCHC: 32.5 g/dL (ref 30.0–36.0)
MCV: 85.8 fL (ref 80.0–100.0)
Platelets: 354 K/uL (ref 150–400)
RBC: 4.3 MIL/uL (ref 4.22–5.81)
RDW: 13.6 % (ref 11.5–15.5)
WBC: 9.8 K/uL (ref 4.0–10.5)
nRBC: 0 % (ref 0.0–0.2)

## 2024-08-10 LAB — BASIC METABOLIC PANEL WITH GFR
Anion gap: 13 (ref 5–15)
BUN: 18 mg/dL (ref 8–23)
CO2: 30 mmol/L (ref 22–32)
Calcium: 9.1 mg/dL (ref 8.9–10.3)
Chloride: 97 mmol/L — ABNORMAL LOW (ref 98–111)
Creatinine, Ser: 1.81 mg/dL — ABNORMAL HIGH (ref 0.61–1.24)
GFR, Estimated: 39 mL/min — ABNORMAL LOW (ref >60)
Glucose, Bld: 132 mg/dL — ABNORMAL HIGH (ref 70–99)
Potassium: 3.5 mmol/L (ref 3.5–5.1)
Sodium: 140 mmol/L (ref 135–145)

## 2024-08-10 LAB — GLUCOSE, CAPILLARY
Glucose-Capillary: 184 mg/dL — ABNORMAL HIGH (ref 70–99)
Glucose-Capillary: 129 mg/dL — ABNORMAL HIGH (ref 70–99)
Glucose-Capillary: 168 mg/dL — ABNORMAL HIGH (ref 70–99)
Glucose-Capillary: 140 mg/dL — ABNORMAL HIGH (ref 70–99)

## 2024-08-10 MED ORDER — MAGNESIUM HYDROXIDE 400 MG/5ML PO SUSP
15.0000 mL | Freq: Every day | ORAL | Status: DC | PRN
  Administered 2024-08-10: 15 mL via ORAL
  Filled 2024-08-10 (×2): qty 30

## 2024-08-10 MED ORDER — SODIUM CHLORIDE 0.9 % IV SOLN
12.5000 mg | Freq: Four times a day (QID) | INTRAVENOUS | Status: DC | PRN
  Filled 2024-08-10: qty 0.5

## 2024-08-10 MED ORDER — ONDANSETRON HCL 4 MG PO TABS
4.0000 mg | ORAL_TABLET | Freq: Once | ORAL | Status: DC
  Filled 2024-08-10: qty 1

## 2024-08-10 MED ORDER — IOHEXOL 350 MG/ML SOLN
100.0000 mL | Freq: Once | INTRAVENOUS | Status: AC | PRN
  Administered 2024-08-10: 100 mL via INTRAVENOUS

## 2024-08-10 NOTE — Progress Notes (Signed)
 [facility] [place]  Subjective  -states that his abdominal pain is much better today   Objective (!) 134/47 61 97.6 F (36.4 C) (Oral) 18 98% No intake or output data in the 24 hours ending 08/10/24 0641  No significant abdominal pain on my evaluation  Laboratory Lab Results: Recent Labs    08/09/24 0424 08/10/24 0521  WBC 8.2 9.8  HGB 11.2* 12.0*  HCT 34.6* 36.9*  PLT 321 354   BMET Recent Labs    08/09/24 0424 08/10/24 0521  NA 135 140  K 3.2* 3.5  CL 93* 97*  CO2 30 30  GLUCOSE 125* 132*  BUN 23 18  CREATININE 2.08* 1.81*  CALCIUM  8.7* 9.1    COAG Lab Results  Component Value Date   INR 1.1 06/10/2020   INR 1.07 01/28/2014   INR 1.00 05/20/2013   No results found for: PTT  Assessment/Planning:  Complicated 73 year old admitted with abdominal pain in the setting of prior type IV thoracoabdominal aneurysm repair in 2004 including reimplantation of the renal arteries.  He has CT scan on admission with evidence of tethering loops of small bowel and appendix along the right limb of his bypass graft with inflammation.  Blood cultures no growth to date.  White blood cell count has normalized.  Continue broad-spectrum antibiotics.  Would recommend ID consult today as he needs a very aggressive outpatient antibiotic plan.  I have reviewed his images with multiple partners and there is really no suitable way to excise his graft given this goes up to the level of the SMA and involves reimplantation of the renals and getting into his chest.  This would be highly morbid for him and put him on dialysis.  There is no fluid around the graft just more inflammation.  I will order repeat CT today as his creatinine has improved although he has some CKD at baseline.   Lonni JINNY Gaskins 08/10/2024 6:41 AM --

## 2024-08-10 NOTE — Progress Notes (Signed)
 Progress Note     Subjective: States RLQ pain is stable compared to yesterday, unchanged by PO intake. Pain is improved compared to day of admission. Ate 100% of breakfast. Reports his last BM was 1-2 days ago. He is having flatus. Walking with walker.   Objective: Vital signs in last 24 hours: Temp:  [97.6 F (36.4 C)-98.6 F (37 C)] 97.7 F (36.5 C) (11/25 0827) Pulse Rate:  [58-81] 58 (11/25 0827) Resp:  [16-20] 19 (11/25 0827) BP: (121-138)/(47-76) 126/51 (11/25 0827) SpO2:  [97 %-99 %] 99 % (11/25 0827) Last BM Date : 08/05/24  Intake/Output from previous day: No intake/output data recorded. Intake/Output this shift: No intake/output data recorded.  PE: General: pleasant, WD, obese male who is laying in bed in NAD Heart: regular, rate, and rhythm.  Lungs:  Respiratory effort nonlabored Abd: soft,mild ttp of LLQ today and minimally tender in RLQ, no visible abdominal wall cellulitis, obese Psych: A&Ox3 with an appropriate affect.    Lab Results:  Recent Labs    08/09/24 0424 08/10/24 0521  WBC 8.2 9.8  HGB 11.2* 12.0*  HCT 34.6* 36.9*  PLT 321 354   BMET Recent Labs    08/09/24 0424 08/10/24 0521  NA 135 140  K 3.2* 3.5  CL 93* 97*  CO2 30 30  GLUCOSE 125* 132*  BUN 23 18  CREATININE 2.08* 1.81*  CALCIUM  8.7* 9.1   PT/INR No results for input(s): LABPROT, INR in the last 72 hours. CMP     Component Value Date/Time   NA 140 08/10/2024 0521   NA 140 06/15/2024 0927   K 3.5 08/10/2024 0521   CL 97 (L) 08/10/2024 0521   CO2 30 08/10/2024 0521   GLUCOSE 132 (H) 08/10/2024 0521   BUN 18 08/10/2024 0521   BUN 22 06/15/2024 0927   CREATININE 1.81 (H) 08/10/2024 0521   CREATININE 1.16 01/13/2015 0936   CALCIUM  9.1 08/10/2024 0521   PROT 6.0 (L) 08/07/2024 0319   ALBUMIN  2.5 (L) 08/07/2024 0319   AST 15 08/07/2024 0319   ALT 12 08/07/2024 0319   ALKPHOS 51 08/07/2024 0319   BILITOT 1.1 08/07/2024 0319   GFRNONAA 39 (L) 08/10/2024 0521    GFRNONAA 53 (L) 07/19/2014 1156   GFRAA >60 06/11/2020 0754   GFRAA 61 07/19/2014 1156   Lipase     Component Value Date/Time   LIPASE 45.0 08/02/2024 1230       Studies/Results: CT ABDOMEN PELVIS W CONTRAST Result Date: 08/10/2024 CLINICAL DATA:  Appendicitis suspected. EXAM: CT ABDOMEN AND PELVIS WITH CONTRAST TECHNIQUE: Multidetector CT imaging of the abdomen and pelvis was performed using the standard protocol following bolus administration of intravenous contrast. RADIATION DOSE REDUCTION: This exam was performed according to the departmental dose-optimization program which includes automated exposure control, adjustment of the mA and/or kV according to patient size and/or use of iterative reconstruction technique. CONTRAST:  OMNIPAQUE  IOHEXOL  350 MG/ML SOLN COMPARISON:  08/03/2024 FINDINGS: Lower chest: Centrilobular emphsyema noted. Calcified granuloma again noted left lung base with tiny calcified granuloma identified right lower lobe. Hepatobiliary: No suspicious focal abnormality within the liver parenchyma. Gallbladder is surgically absent. No intrahepatic or extrahepatic biliary dilation. Pancreas: No focal mass lesion. No dilatation of the main duct. No intraparenchymal cyst. No peripancreatic edema. Spleen: No splenomegaly. No suspicious focal mass lesion. Adrenals/Urinary Tract: No adrenal nodule or mass. Cortical scarring noted in both kidneys with tiny bilateral nonobstructing renal stones. Mild right hydroureteronephrosis evident with right ureteral obstruction identified  at the level of the inflammatory changes seen in the right pelvis. The urinary bladder appears normal for the degree of distention. Stomach/Bowel: Moderate hiatal hernia. Duodenum is normally positioned as is the ligament of Treitz. No small bowel obstruction. Small bowel appears to be tethered to the region of the right common iliac artery bypass graft. As noted previously, the appendix appears to be  tethered in this region in the appendix is thickened and ill-defined measuring up to at least 10 mm diameter with periappendiceal edema/inflammation. The terminal ileum is normal. No gross colonic mass. No colonic wall thickening. Diverticular changes are noted in the left colon without evidence of diverticulitis. Vascular/Lymphatic: There is advanced atherosclerotic calcification of the abdominal aorta without aneurysm. Patient is status post aortic bypass grafting to the level of the left external iliac artery and right common femoral artery. As noted previously, there is a relatively thick collar of soft tissue attenuation circumferentially encasing the right iliac bypass graft. As before, there is edema/inflammation in the posterior mesentery immediately adjacent to the right iliac graft which tethers small bowel and also the appendix. The appendiceal tip may be seen just anterior and inferior to the graft bifurcation on image 60/3. Imaging features are generally stable in the interval since the prior study. Reproductive: The prostate gland and seminal vesicles are unremarkable. Other: No intraperitoneal free fluid. Musculoskeletal: Status post left hip replacement. No worrisome lytic or sclerotic osseous abnormality. IMPRESSION: 1. No substantial interval change since 08/03/2024. As noted previously, there is a relatively thick collar of soft tissue attenuation circumferentially encasing the right iliac bypass graft with edema/inflammation in the posterior mesentery immediately adjacent to the right iliac graft. Features are highly suspicious for infection/inflammation of the iliac bypass limb with subsequent tethering of small bowel and appendix into the infectious/inflammatory process. The appendix remains thickened and ill-defined measuring up to at least 10 mm diameter with periappendiceal edema/inflammation. Imaging features are generally stable in the interval since the prior study and while the  appearance of the appendix is felt to be secondary, primary appendicitis is not excluded. 2. Mild right hydroureteronephrosis with right ureteral obstruction identified at the level of the inflammatory changes seen in the right posterior pelvis. 3. Moderate hiatal hernia. 4. Left colonic diverticulosis without diverticulitis. 5.  Aortic Atherosclerosis (ICD10-I70.0). Electronically Signed   By: Camellia Candle M.D.   On: 08/10/2024 09:08    Anti-infectives: Anti-infectives (From admission, onward)    Start     Dose/Rate Route Frequency Ordered Stop   08/08/24 1331  vancomycin  variable dose per unstable renal function (pharmacist dosing)  Status:  Discontinued         Does not apply See admin instructions 08/08/24 1331 08/09/24 1016   08/08/24 1030  ceFEPIme  (MAXIPIME ) 2 g in sodium chloride  0.9 % 100 mL IVPB        2 g 200 mL/hr over 30 Minutes Intravenous Every 24 hours 08/08/24 0944     08/08/24 1030  metroNIDAZOLE  (FLAGYL ) IVPB 500 mg        500 mg 100 mL/hr over 60 Minutes Intravenous Every 12 hours 08/08/24 0944     08/07/24 1200  vancomycin  (VANCOCIN ) IVPB 1000 mg/200 mL premix  Status:  Discontinued        1,000 mg 200 mL/hr over 60 Minutes Intravenous Every 24 hours 08/07/24 0814 08/08/24 1331   08/06/24 0930  vancomycin  (VANCOCIN ) IVPB 1000 mg/200 mL premix  Status:  Discontinued        1,000 mg 200  mL/hr over 60 Minutes Intravenous Every 24 hours 08/06/24 0839 08/07/24 0814   08/04/24 1730  vancomycin  (VANCOCIN ) 2,500 mg in sodium chloride  0.9 % 500 mL IVPB        2,500 mg 262.5 mL/hr over 120 Minutes Intravenous  Once 08/04/24 1717 08/04/24 2331   08/04/24 1730  piperacillin -tazobactam (ZOSYN ) IVPB 3.375 g  Status:  Discontinued        3.375 g 12.5 mL/hr over 240 Minutes Intravenous Every 8 hours 08/04/24 1718 08/08/24 0943   08/04/24 1715  vancomycin  (VANCOCIN ) IVPB 1000 mg/200 mL premix  Status:  Discontinued        1,000 mg 200 mL/hr over 60 Minutes Intravenous  Once 08/04/24  1711 08/04/24 1717        Assessment/Plan Abdominal pain and concern for possible iliac limb/bypass graft infection with reactive inflammation of appendix versus primary appendicitis with resultant inflammation of iliac limb/graft.  - CT 11/18 without obvious free air or free fluid. No obvious appendicolith. HDS. No peritonitis on exam. WBC has normalized. Would not recommend surgery at this time given above and CT showing tethering to his graft and involvement of ureter and several loops of small intestine making significant adhesions making the likelihood of needing a more extensive laparotomy with risk of injury to any and all of the structure more likely.  -Would continue to observe with non-operative management on abx for now.  - Agree with broad spectrum antibiotics, blood Cxs from 11/19 with NGTD. - Okay soft diet. Trend labs - CT scan today with IV and PO contrast, will follow results.  - agree with ID consults for long-term abx    FEN - soft diet  VTE: SCDs, Lovenox  ID: cefepime /flagyl   - per TR[facility] - Hx of thoracoabdominal aortic aneurysm s/p bypass graft PAD CAD HTN HLD Hx of SVT AKI on CKD IIIa Chronic diastolic CHF COPD BPH T2DM OSA Morbid Obesity     LOS: 6 days   I reviewed Consultant vascular notes, hospitalist notes, last 24 h vitals and pain scores, last 48 h intake and output, last 24 h labs and trends, and last 24 h imaging results.  This care required moderate level of medical decision making.    Almarie GORMAN Pringle, [facility] 08/10/2024, 9:24 AM Please see Amion for pager number during day hours 7:00am-4:30pm

## 2024-08-10 NOTE — Progress Notes (Signed)
 PROGRESS NOTE    Alfred Carney  FMW:[id number] DOB: 1950-12-18 DOA: 08/04/2024 PCP: Corwin Antu, FNP  Chronically ill 73/M with history of COPD, chronic hypoxic respiratory failure on 2 L Axis, chronic diastolic HF, HTN, HLD, CKD 3A, class III obesity, T2DM, OSA, GERD, PAD s/p bypass, CAD, carotid stenosis and thoracoabdominal aneurysm s/p repair who presented to the ED for evaluation of abdominal pain. Patient reports 4 to 5 days of nagging, intermittent right lower quadrant pain.  He was seen by his PCP on 11/17 for evaluation and a CT abdomen pelvis with contrast was obtained 11/18 for further evaluation. Results showed evidence of inflammatory changes around the right limb of his bypass graft as well as thickening of his appendix concerning for infection. Patient was called to present to the ED, labs noted WBC of 15, creatinine 1.7, started on broad-spectrum antibiotics, seen by vascular surgery and general surgery in consultation   Subjective: Patient seen and examined at bedside today.  He has no acute concerns this morning.  Reports intermittent right lower quadrant discomfort otherwise he states he is doing well.  Assessment and Plan:  Intra-abdominal infection, suspect appendicitis - Patient presented with 1 week of intermittent RLQ pain with associated nausea - CT abdomen pelvis from PCP office shows some inflammation around the right limb of the graft including some tethered small bowel, tip of the appendix, distal right ureter and concern for appendix inflammation    - General Surgery and vascular surgery following,  -Plan to continue IV abx with cefepime , Flagyl   -DC ed Vanc/zosyn  due to worsening SCr -Felt to be a poor surgical candidate, recommended to continue broad-spectrum antibiotics for now  - Negative blood cultures, WBC is normal.   - General Surgery's recommendation is continue to treat conservatively with antibiotics - Repeat imaging ordered by vascular surgery.  PAD  s/p bypass Hx of thoracoabdominal aneurysm - status post aortobifemoral bypass graft. Diffuse circumferential thickening and inflammatory changes along the right iliac limb of the bypass graft. There is infiltration of the surrounding fat plane.   -there is concern for possible iliac limb/bypass graft infection with reactive inflammation of appendix   - Vascular surgery following, recommending to broaden antibiotic coverage with vancomycin  along with Zosyn , however due to worsening Scr they were discontinued and he is on Cefepime  and Flagyl .   -ID consulted for outpatient antibiotic regimen. -CT A/P 11/25 - No substantial interval change since 08/03/2024.  As noted previously, there is a relatively thick collar of soft tissue attenuation circumferentially encasing the right iliac bypass graft with edema/inflammation in the posterior mesentery immediately adjacent to the right iliac graft. Features are highly suspicious for infection/inflammation of the iliac bypass limb with subsequent tethering of small bowel and appendix into the infectious/inflammatory process. The appendix remains thickened and ill-defined measuring up to at least 10 mm diameter with periappendiceal edema/inflammation. - Imaging features are generally stable in the interval since the prior study and while the appearance of the appendix is felt to be secondary, primary appendicitis is not excluded. -Mild right hydroureteronephrosis with right ureteral obstruction identified at the level of the inflammatory changes seen in the right posterior pelvis. -Moderate hiatal hernia. -Left colonic diverticulosis without diverticulitis. -Aortic Atherosclerosis  T2DM with hyperglycemia - Last A1c 7.4% 1 month ago, blood sugar 194 on admission - SSI with meals, CBG monitoring   AKI on CKD 3A - Slight rise in creatinine to 2.20  from baseline of 1.2-1.5 - Monitor, hold ARB, aldactone , DC vanc/zosyn  -Started on  cefepime  and flagyl     HTN - Stable - Continue , metoprolol , hold ARB   Chronic diastolic heart failure Chronic lower extremity swelling -Continue Lasix , metoprolol , and    COPD/Chronic hypoxic respiratory failure - Chronic and stable, no signs of COPD exacerbation - Remains on baseline 2 L [place], continue - As needed DuoNebs   Hx of PSVT - Continue metoprolol    # HLD # CAD - Continue pravastatin    # Gout - Continue allopurinol    # BPH - Continue tamsulosin    # OSA - Not on CPAP   # Class III obesity Body mass index is 40.63 kg/m.   DVT prophylaxis: Lovenox  Code Status: DNR Family Communication: Discussed with patient detail, no family at bedside Disposition Plan:   Consultants:    Procedures:   Antimicrobials:    Objective: Vitals:   08/09/24 1554 08/09/24 1954 08/10/24 0439 08/10/24 0827  BP: 138/76 (!) 121/49 (!) 134/47 (!) 126/51  Pulse: 81 69 61 (!) 58  Resp: 16 20 18 19   Temp: 97.9 F (36.6 C) 98.6 F (37 C) 97.6 F (36.4 C) 97.7 F (36.5 C)  TempSrc: Oral Oral Oral Oral  SpO2: 97% 98% 98% 99%  Weight:      Height:       No intake or output data in the 24 hours ending 08/10/24 1159   Filed Weights   08/04/24 1238  Weight: 124.8 kg    Examination:  General exam: Obese chronically ill, AAO x 3 Respiratory system: decreased breath sounds at the bases Cardiovascular system: S1 & S2 heard, RRR.  Abd: Soft, mild right lower quadrant tenderness, bowel sounds present Central nervous system: Alert and oriented. No focal neurological deficits. Extremities: no edema Skin: No rashes Psychiatry:  Mood & affect appropriate.     Data Reviewed:   CBC: Recent Labs  Lab 08/04/24 1244 08/05/24 0407 08/06/24 0512 08/07/24 0319 08/08/24 0914 08/09/24 0424 08/10/24 0521  WBC 15.2*   < > 12.9* 12.9* 10.1 8.2 9.8  NEUTROABS 11.8*  --   --  8.8*  --   --   --   HGB 13.3   < > 11.8* 11.5* 10.6* 11.2* 12.0*  HCT 40.8   < > 36.8* 35.1* 32.9* 34.6* 36.9*  MCV 87.2    < > 87.8 86.2 87.0 86.3 85.8  PLT 338   < > 285 316 286 321 354   < > = values in this interval not displayed.   Basic Metabolic Panel: Recent Labs  Lab 08/06/24 0512 08/07/24 0319 08/08/24 0229 08/09/24 0424 08/10/24 0521  NA 138 138 136 135 140  K 4.2 3.7 3.6 3.2* 3.5  CL 99 97* 96* 93* 97*  CO2 25 24 28 30 30   GLUCOSE 147* 130* 158* 125* 132*  BUN 18 16 22 23 18   CREATININE 1.88* 2.01* 2.20* 2.08* 1.81*  CALCIUM  8.7* 8.8* 8.9 8.7* 9.1  MG  --   --   --   --  1.7  PHOS  --   --   --   --  2.9   GFR: Estimated Creatinine Clearance: 46.7 mL/min (A) (by C-G formula based on SCr of 1.81 mg/dL (H)). Liver Function Tests: Recent Labs  Lab 08/04/24 1244 08/05/24 0407 08/07/24 0319  AST 16 15 15   ALT 13 11 12   ALKPHOS 69 70 51  BILITOT 0.9 1.1 1.1  PROT 7.2 6.6 6.0*  ALBUMIN  3.0* 2.9* 2.5*   No results for input(s): LIPASE, AMYLASE in the last  168 hours.  No results for input(s): AMMONIA in the last 168 hours. Coagulation Profile: No results for input(s): INR, PROTIME in the last 168 hours. Cardiac Enzymes: No results for input(s): CKTOTAL, CKMB, CKMBINDEX, TROPONINI in the last 168 hours. BNP (last 3 results) Recent Labs    05/10/24 1224  PROBNP 89.0   HbA1C: No results for input(s): HGBA1C in the last 72 hours. CBG: Recent Labs  Lab 08/09/24 0757 08/09/24 1156 08/09/24 1640 08/09/24 2058 08/10/24 0829  GLUCAP 148* 165* 143* 170* 184*   Lipid Profile: No results for input(s): CHOL, HDL, LDLCALC, TRIG, CHOLHDL, LDLDIRECT in the last 72 hours. Thyroid  Function Tests: No results for input(s): TSH, T4TOTAL, FREET4, T3FREE, THYROIDAB in the last 72 hours. Anemia Panel: No results for input(s): VITAMINB12, FOLATE, FERRITIN, TIBC, IRON, RETICCTPCT in the last 72 hours. Urine analysis:    Component Value Date/Time   COLORURINE YELLOW 08/04/2024 1317   APPEARANCEUR CLEAR 08/04/2024 1317   LABSPEC 1.012  08/04/2024 1317   PHURINE 6.0 08/04/2024 1317   GLUCOSEU NEGATIVE 08/04/2024 1317   HGBUR NEGATIVE 08/04/2024 1317   BILIRUBINUR NEGATIVE 08/04/2024 1317   BILIRUBINUR negative 08/02/2024 1234   KETONESUR NEGATIVE 08/04/2024 1317   PROTEINUR NEGATIVE 08/04/2024 1317   UROBILINOGEN 0.2 08/02/2024 1234   UROBILINOGEN 1.0 05/05/2014 1706   NITRITE NEGATIVE 08/04/2024 1317   LEUKOCYTESUR NEGATIVE 08/04/2024 1317   Sepsis Labs: @LABRCNTIP (procalcitonin:4,lacticidven:4)  ) Recent Results (from the past 240 hours)  Blood culture (routine x 2)     Status: None   Collection Time: 08/04/24  5:22 PM   Specimen: BLOOD  Result Value Ref Range Status   Specimen Description BLOOD LEFT ANTECUBITAL  Final   Special Requests   Final    BOTTLES DRAWN AEROBIC ONLY Blood Culture results may not be optimal due to an inadequate volume of blood received in culture bottles   Culture   Final    NO GROWTH 5 DAYS Performed at [facility], [address], [place], [place] [address]    Report Status 08/09/2024 FINAL  Final  Blood culture (routine x 2)     Status: None   Collection Time: 08/04/24  9:05 PM   Specimen: BLOOD  Result Value Ref Range Status   Specimen Description BLOOD SITE NOT SPECIFIED  Final   Special Requests   Final    BOTTLES DRAWN AEROBIC ONLY Blood Culture adequate volume   Culture   Final    NO GROWTH 5 DAYS Performed at [facility], [address] [place], [place] [address]    Report Status 08/09/2024 FINAL  Final     Radiology Studies: CT ABDOMEN PELVIS W CONTRAST Result Date: 08/10/2024 CLINICAL DATA:  Appendicitis suspected. EXAM: CT ABDOMEN AND PELVIS WITH CONTRAST TECHNIQUE: Multidetector CT imaging of the abdomen and pelvis was performed using the standard protocol following bolus administration of intravenous contrast. RADIATION DOSE REDUCTION: This exam was performed according to the departmental dose-optimization program which includes automated exposure  control, adjustment of the mA and/or kV according to patient size and/or use of iterative reconstruction technique. CONTRAST:  OMNIPAQUE  IOHEXOL  350 MG/ML SOLN COMPARISON:  08/03/2024 FINDINGS: Lower chest: Centrilobular emphsyema noted. Calcified granuloma again noted left lung base with tiny calcified granuloma identified right lower lobe. Hepatobiliary: No suspicious focal abnormality within the liver parenchyma. Gallbladder is surgically absent. No intrahepatic or extrahepatic biliary dilation. Pancreas: No focal mass lesion. No dilatation of the main duct. No intraparenchymal cyst. No peripancreatic edema. Spleen:  No splenomegaly. No suspicious focal mass lesion. Adrenals/Urinary Tract: No adrenal nodule or mass. Cortical scarring noted in both kidneys with tiny bilateral nonobstructing renal stones. Mild right hydroureteronephrosis evident with right ureteral obstruction identified at the level of the inflammatory changes seen in the right pelvis. The urinary bladder appears normal for the degree of distention. Stomach/Bowel: Moderate hiatal hernia. Duodenum is normally positioned as is the ligament of Treitz. No small bowel obstruction. Small bowel appears to be tethered to the region of the right common iliac artery bypass graft. As noted previously, the appendix appears to be tethered in this region in the appendix is thickened and ill-defined measuring up to at least 10 mm diameter with periappendiceal edema/inflammation. The terminal ileum is normal. No gross colonic mass. No colonic wall thickening. Diverticular changes are noted in the left colon without evidence of diverticulitis. Vascular/Lymphatic: There is advanced atherosclerotic calcification of the abdominal aorta without aneurysm. Patient is status post aortic bypass grafting to the level of the left external iliac artery and right common femoral artery. As noted previously, there is a relatively thick collar of soft tissue attenuation  circumferentially encasing the right iliac bypass graft. As before, there is edema/inflammation in the posterior mesentery immediately adjacent to the right iliac graft which tethers small bowel and also the appendix. The appendiceal tip may be seen just anterior and inferior to the graft bifurcation on image 60/3. Imaging features are generally stable in the interval since the prior study. Reproductive: The prostate gland and seminal vesicles are unremarkable. Other: No intraperitoneal free fluid. Musculoskeletal: Status post left hip replacement. No worrisome lytic or sclerotic osseous abnormality. IMPRESSION: 1. No substantial interval change since 08/03/2024. As noted previously, there is a relatively thick collar of soft tissue attenuation circumferentially encasing the right iliac bypass graft with edema/inflammation in the posterior mesentery immediately adjacent to the right iliac graft. Features are highly suspicious for infection/inflammation of the iliac bypass limb with subsequent tethering of small bowel and appendix into the infectious/inflammatory process. The appendix remains thickened and ill-defined measuring up to at least 10 mm diameter with periappendiceal edema/inflammation. Imaging features are generally stable in the interval since the prior study and while the appearance of the appendix is felt to be secondary, primary appendicitis is not excluded. 2. Mild right hydroureteronephrosis with right ureteral obstruction identified at the level of the inflammatory changes seen in the right posterior pelvis. 3. Moderate hiatal hernia. 4. Left colonic diverticulosis without diverticulitis. 5.  Aortic Atherosclerosis (ICD10-I70.0). Electronically Signed   By: Camellia Candle M.D.   On: 08/10/2024 09:08      Scheduled Meds:  allopurinol   200 mg Oral Daily   enoxaparin  (LOVENOX ) injection  60 mg Subcutaneous Q24H   insulin  aspart  0-15 Units Subcutaneous TID WC   insulin  aspart  0-5 Units  Subcutaneous QHS   metoprolol  tartrate  25 mg Oral Q1500   pantoprazole   40 mg Oral Daily   pravastatin   40 mg Oral q1800   tamsulosin   0.4 mg Oral QHS   Continuous Infusions:  ceFEPime  (MAXIPIME ) IV 2 g (08/10/24 1126)   metronidazole  500 mg (08/09/24 2156)     LOS: 6 days    Time spent:    Landon Baller MD [facility]   08/10/2024, 11:59 AM

## 2024-08-10 NOTE — Plan of Care (Signed)

## 2024-08-11 ENCOUNTER — Other Ambulatory Visit: Payer: Self-pay

## 2024-08-11 DIAGNOSIS — B999 Unspecified infectious disease: Secondary | ICD-10-CM | POA: Diagnosis not present

## 2024-08-11 LAB — GLUCOSE, CAPILLARY
Glucose-Capillary: 198 mg/dL — ABNORMAL HIGH (ref 70–99)
Glucose-Capillary: 128 mg/dL — ABNORMAL HIGH (ref 70–99)
Glucose-Capillary: 175 mg/dL — ABNORMAL HIGH (ref 70–99)
Glucose-Capillary: 159 mg/dL — ABNORMAL HIGH (ref 70–99)

## 2024-08-11 LAB — BASIC METABOLIC PANEL WITH GFR
Anion gap: 13 (ref 5–15)
BUN: 14 mg/dL (ref 8–23)
CO2: 29 mmol/L (ref 22–32)
Calcium: 9.2 mg/dL (ref 8.9–10.3)
Chloride: 97 mmol/L — ABNORMAL LOW (ref 98–111)
Creatinine, Ser: 1.61 mg/dL — ABNORMAL HIGH (ref 0.61–1.24)
GFR, Estimated: 45 mL/min — ABNORMAL LOW (ref >60)
Glucose, Bld: 193 mg/dL — ABNORMAL HIGH (ref 70–99)
Potassium: 3.4 mmol/L — ABNORMAL LOW (ref 3.5–5.1)
Sodium: 139 mmol/L (ref 135–145)

## 2024-08-11 LAB — MAGNESIUM: Magnesium: 1.7 mg/dL (ref 1.7–2.4)

## 2024-08-11 MED ORDER — MAGNESIUM SULFATE 2 GM/50ML IV SOLN
2.0000 g | Freq: Once | INTRAVENOUS | Status: AC
  Administered 2024-08-11: 2 g via INTRAVENOUS
  Filled 2024-08-11: qty 50

## 2024-08-11 MED ORDER — SODIUM CHLORIDE 0.9 % IV SOLN
2.0000 g | Freq: Two times a day (BID) | INTRAVENOUS | Status: DC
  Administered 2024-08-11: 2 g via INTRAVENOUS
  Filled 2024-08-11: qty 12.5

## 2024-08-11 MED ORDER — PIPERACILLIN-TAZOBACTAM 3.375 G IVPB
3.3750 g | Freq: Three times a day (TID) | INTRAVENOUS | Status: DC
  Administered 2024-08-11 – 2024-08-12 (×2): 3.375 g via INTRAVENOUS
  Filled 2024-08-11 (×2): qty 50

## 2024-08-11 MED ORDER — PIPERACILLIN-TAZOBACTAM IV (FOR PTA / DISCHARGE USE ONLY): 13.5000 g | INTRAVENOUS | 0 refills | Status: AC

## 2024-08-11 MED ORDER — POTASSIUM CHLORIDE CRYS ER 20 MEQ PO TBCR
20.0000 meq | EXTENDED_RELEASE_TABLET | Freq: Once | ORAL | Status: AC
  Administered 2024-08-11: 20 meq via ORAL
  Filled 2024-08-11: qty 1

## 2024-08-11 MED ORDER — LACTATED RINGERS IV SOLN: INTRAVENOUS | Status: DC

## 2024-08-11 NOTE — Progress Notes (Signed)
 PROGRESS NOTE    Alfred Carney  FMW:[id number] DOB: 10-05-1950 DOA: 08/04/2024 PCP: Corwin Antu, FNP   Brief Narrative: 73 year old with past medical history significant for COPD, chronic hypoxic respiratory failure on 2 L nasal cannula, chronic diastolic heart failure, hypertension, hyperlipidemia, CKD 3A, class III obesity, diabetes type 2, OSA, GERD, PAD status post bypass, CAD, carotid stenosis and thoracoabdominal aneurysm s/p repair who presented to the ED for evaluation of abdominal pain.  Patient reports 4 to 5 days of nagging, intermittent right lower quadrant abdominal pain.  Recently seen by PCP 11/17 for evaluation and a CT abdomen and pelvis with contrast was obtained on 11/18 for further evaluation.  Results show evidence of inflammatory changes around the right lower limb of his bypass graft as well as thickening of his appendix concerning for infection.  Patient was called to present to the ED, labs showed white blood cell of 15, creatinine 1.7, started on broad-spectrum antibiotics, seen by vascular surgery General Surgery consultation.   Assessment & Plan:   Principal Problem:   Intra-abdominal infection Active Problems:   Chronic hypoxic respiratory failure (HCC)   Chronic diastolic CHF (congestive heart failure) (HCC)   Obesity, Class III, BMI 40-49.9 (morbid obesity) (HCC)   Type 2 diabetes mellitus with hyperglycemia, without long-term current use of insulin  (HCC)   History of repair of thoracoabdominal aortic aneurysm (TAAA)   Acute renal failure superimposed on stage 3a chronic kidney disease (HCC)   Chronic obstructive pulmonary disease (HCC)  1-Intra-abdominal infection suspected appendicitis PAD status post bypass History of thoracoabdominal aneurysmal - Patient presented with 1 week of intermittent right lower quadrant pain with associated nausea. - CT abdomen and pelvis which was ordered by PCP showed inflammation around the right limb of the graft including  some tethering of the loop of the small bowel, tip of the appendix and the distal ureter to the right iliac limb of the bypass consistent with adhesions, and inflammation. - Vascular and general surgery following. - Repeated CT abdomen and pelvis with contrast 11/25th: Show no substantial interval change since 11/18.  Relatively thick soft tissue attenuation circumferentially encasing the right iliac bypass graft with edema and inflammation in the posterior mesentery immediately adjacent to the right iliac graft.  Features are highly suspicious for infection/inflammation of the iliac bypass limb with subsequent tethering  of the small bowel and appendix. - Patient was felt to be a poor surgical candidate, recommendation was to continue broad-spectrum antibiotics and ID consultation. ID consulted plan to proceed with IV antibiotics at discharge ID recommended IV Zosyn   Diabetes type 2 with hyperglycemia: - Continue sliding scale insulin   AKI on CKD 3 AA Creatinine baseline 1.2--- 1.5 Continue to hold ARB Aldactone  and vancomycin  was discontinue He was started on cefepime  and Flagyl . Renal function improving, down to 1.6 Received contrast yesterday for CT scan, will give IV fluids today.  Hypokalemia replaced orally. Hypomagnesemia: Replace IV  Hypertension - Continue metoprolol .  Holding ARB due to AKI  Chronic diastolic heart failure Chronic lower extremity swelling - Continue metoprolol  Holding Lasix   COPD/chronic hypoxic respiratory failure Chronic 2 L of oxygen  Continue 2 L of oxygen  supplementation As needed DuoNeb  History of paroxysmal SVT - Continue metoprolol   Hyperlipidemia, CAD: - Continue statin  Gout: - Continue allopurinol   BPH: Continue tamsulosin   OSA: CPAP.  Class III obesity Lifestyle modification    Estimated body mass index is 40.63 kg/m as calculated from the following:   Height as of this encounter: 5' 9 (  1.753 m).   Weight as of this  encounter: 124.8 kg.   DVT prophylaxis: Lovenox  Code Status: DNR Family Communication: Care discussed with patient Disposition Plan:  Status is: Inpatient Remains inpatient appropriate because: Management of intra-abdominal infection    Consultants:  Vascular General surgery ID  Procedures:    Antimicrobials:    Subjective: Patient reports abdominal pain on and off, currently denies any pain.  He relates it is not associated after meals.  He had a bowel movement.  Objective: Vitals:   08/10/24 1554 08/10/24 1956 08/11/24 0331 08/11/24 0757  BP: (!) 145/56 (!) 126/57 (!) 107/51 (!) 144/57  Pulse: 73 62 80 80  Resp: 18 16 18 19   Temp: 98.2 F (36.8 C) (!) 97.4 F (36.3 C) 97.7 F (36.5 C) 98.4 F (36.9 C)  TempSrc: Oral Oral Oral Oral  SpO2: 97% 98% 98% 95%  Weight:      Height:       No intake or output data in the 24 hours ending 08/11/24 0855 Filed Weights   08/04/24 1238  Weight: 124.8 kg    Examination:  General exam: Appears calm and comfortable  Respiratory system: Clear to auscultation. Respiratory effort normal. Cardiovascular system: S1 & S2 heard, RRR. No JVD, murmurs, rubs, gallops or clicks. No pedal edema. Gastrointestinal system: Abdomen is nondistended, soft and nontender.  Central nervous system: Alert and oriented. No focal neurological deficits. Extremities: Symmetric 5 x 5 power.   Data Reviewed: I have personally reviewed following labs and imaging studies  CBC: Recent Labs  Lab 08/04/24 1244 08/05/24 0407 08/06/24 0512 08/07/24 0319 08/08/24 0914 08/09/24 0424 08/10/24 0521  WBC 15.2*   < > 12.9* 12.9* 10.1 8.2 9.8  NEUTROABS 11.8*  --   --  8.8*  --   --   --   HGB 13.3   < > 11.8* 11.5* 10.6* 11.2* 12.0*  HCT 40.8   < > 36.8* 35.1* 32.9* 34.6* 36.9*  MCV 87.2   < > 87.8 86.2 87.0 86.3 85.8  PLT 338   < > 285 316 286 321 354   < > = values in this interval not displayed.   Basic Metabolic Panel: Recent Labs  Lab  08/06/24 0512 08/07/24 0319 08/08/24 0229 08/09/24 0424 08/10/24 0521  NA 138 138 136 135 140  K 4.2 3.7 3.6 3.2* 3.5  CL 99 97* 96* 93* 97*  CO2 25 24 28 30 30   GLUCOSE 147* 130* 158* 125* 132*  BUN 18 16 22 23 18   CREATININE 1.88* 2.01* 2.20* 2.08* 1.81*  CALCIUM  8.7* 8.8* 8.9 8.7* 9.1  MG  --   --   --   --  1.7  PHOS  --   --   --   --  2.9   GFR: Estimated Creatinine Clearance: 46.7 mL/min (A) (by C-G formula based on SCr of 1.81 mg/dL (H)). Liver Function Tests: Recent Labs  Lab 08/04/24 1244 08/05/24 0407 08/07/24 0319  AST 16 15 15   ALT 13 11 12   ALKPHOS 69 70 51  BILITOT 0.9 1.1 1.1  PROT 7.2 6.6 6.0*  ALBUMIN  3.0* 2.9* 2.5*   No results for input(s): LIPASE, AMYLASE in the last 168 hours. No results for input(s): AMMONIA in the last 168 hours. Coagulation Profile: No results for input(s): INR, PROTIME in the last 168 hours. Cardiac Enzymes: No results for input(s): CKTOTAL, CKMB, CKMBINDEX, TROPONINI in the last 168 hours. BNP (last 3 results) Recent Labs  05/10/24 1224  PROBNP 89.0   HbA1C: No results for input(s): HGBA1C in the last 72 hours. CBG: Recent Labs  Lab 08/09/24 2058 08/10/24 0829 08/10/24 1219 08/10/24 1733 08/10/24 1958  GLUCAP 170* 184* 129* 168* 140*   Lipid Profile: No results for input(s): CHOL, HDL, LDLCALC, TRIG, CHOLHDL, LDLDIRECT in the last 72 hours. Thyroid  Function Tests: No results for input(s): TSH, T4TOTAL, FREET4, T3FREE, THYROIDAB in the last 72 hours. Anemia Panel: No results for input(s): VITAMINB12, FOLATE, FERRITIN, TIBC, IRON, RETICCTPCT in the last 72 hours. Sepsis Labs: No results for input(s): PROCALCITON, LATICACIDVEN in the last 168 hours.  Recent Results (from the past 240 hours)  Blood culture (routine x 2)     Status: None   Collection Time: 08/04/24  5:22 PM   Specimen: BLOOD  Result Value Ref Range Status   Specimen Description BLOOD  LEFT ANTECUBITAL  Final   Special Requests   Final    BOTTLES DRAWN AEROBIC ONLY Blood Culture results may not be optimal due to an inadequate volume of blood received in culture bottles   Culture   Final    NO GROWTH 5 DAYS Performed at [facility], [address], [place], [place] [address]    Report Status 08/09/2024 FINAL  Final  Blood culture (routine x 2)     Status: None   Collection Time: 08/04/24  9:05 PM   Specimen: BLOOD  Result Value Ref Range Status   Specimen Description BLOOD SITE NOT SPECIFIED  Final   Special Requests   Final    BOTTLES DRAWN AEROBIC ONLY Blood Culture adequate volume   Culture   Final    NO GROWTH 5 DAYS Performed at [facility], [address] [place], [place] [address]    Report Status 08/09/2024 FINAL  Final         Radiology Studies: CT ABDOMEN PELVIS W CONTRAST Result Date: 08/10/2024 CLINICAL DATA:  Appendicitis suspected. EXAM: CT ABDOMEN AND PELVIS WITH CONTRAST TECHNIQUE: Multidetector CT imaging of the abdomen and pelvis was performed using the standard protocol following bolus administration of intravenous contrast. RADIATION DOSE REDUCTION: This exam was performed according to the departmental dose-optimization program which includes automated exposure control, adjustment of the mA and/or kV according to patient size and/or use of iterative reconstruction technique. CONTRAST:  OMNIPAQUE  IOHEXOL  350 MG/ML SOLN COMPARISON:  08/03/2024 FINDINGS: Lower chest: Centrilobular emphsyema noted. Calcified granuloma again noted left lung base with tiny calcified granuloma identified right lower lobe. Hepatobiliary: No suspicious focal abnormality within the liver parenchyma. Gallbladder is surgically absent. No intrahepatic or extrahepatic biliary dilation. Pancreas: No focal mass lesion. No dilatation of the main duct. No intraparenchymal cyst. No peripancreatic edema. Spleen: No splenomegaly. No suspicious focal mass lesion.  Adrenals/Urinary Tract: No adrenal nodule or mass. Cortical scarring noted in both kidneys with tiny bilateral nonobstructing renal stones. Mild right hydroureteronephrosis evident with right ureteral obstruction identified at the level of the inflammatory changes seen in the right pelvis. The urinary bladder appears normal for the degree of distention. Stomach/Bowel: Moderate hiatal hernia. Duodenum is normally positioned as is the ligament of Treitz. No small bowel obstruction. Small bowel appears to be tethered to the region of the right common iliac artery bypass graft. As noted previously, the appendix appears to be tethered in this region in the appendix is thickened and ill-defined measuring up to at least 10 mm diameter with periappendiceal edema/inflammation. The terminal ileum is normal. No gross colonic mass.  No colonic wall thickening. Diverticular changes are noted in the left colon without evidence of diverticulitis. Vascular/Lymphatic: There is advanced atherosclerotic calcification of the abdominal aorta without aneurysm. Patient is status post aortic bypass grafting to the level of the left external iliac artery and right common femoral artery. As noted previously, there is a relatively thick collar of soft tissue attenuation circumferentially encasing the right iliac bypass graft. As before, there is edema/inflammation in the posterior mesentery immediately adjacent to the right iliac graft which tethers small bowel and also the appendix. The appendiceal tip may be seen just anterior and inferior to the graft bifurcation on image 60/3. Imaging features are generally stable in the interval since the prior study. Reproductive: The prostate gland and seminal vesicles are unremarkable. Other: No intraperitoneal free fluid. Musculoskeletal: Status post left hip replacement. No worrisome lytic or sclerotic osseous abnormality. IMPRESSION: 1. No substantial interval change since 08/03/2024. As noted  previously, there is a relatively thick collar of soft tissue attenuation circumferentially encasing the right iliac bypass graft with edema/inflammation in the posterior mesentery immediately adjacent to the right iliac graft. Features are highly suspicious for infection/inflammation of the iliac bypass limb with subsequent tethering of small bowel and appendix into the infectious/inflammatory process. The appendix remains thickened and ill-defined measuring up to at least 10 mm diameter with periappendiceal edema/inflammation. Imaging features are generally stable in the interval since the prior study and while the appearance of the appendix is felt to be secondary, primary appendicitis is not excluded. 2. Mild right hydroureteronephrosis with right ureteral obstruction identified at the level of the inflammatory changes seen in the right posterior pelvis. 3. Moderate hiatal hernia. 4. Left colonic diverticulosis without diverticulitis. 5.  Aortic Atherosclerosis (ICD10-I70.0). Electronically Signed   By: Camellia Candle M.D.   On: 08/10/2024 09:08        Scheduled Meds:  allopurinol   200 mg Oral Daily   enoxaparin  (LOVENOX ) injection  60 mg Subcutaneous Q24H   insulin  aspart  0-15 Units Subcutaneous TID WC   insulin  aspart  0-5 Units Subcutaneous QHS   metoprolol  tartrate  25 mg Oral Q1500   ondansetron   4 mg Oral Once   pantoprazole   40 mg Oral Daily   pravastatin   40 mg Oral q1800   tamsulosin   0.4 mg Oral QHS   Continuous Infusions:  ceFEPime  (MAXIPIME ) IV     metronidazole  500 mg (08/10/24 2130)   promethazine  (PHENERGAN ) injection (IM or IVPB)       LOS: 7 days    Time spent: 35 minutes    Clinten Howk A Kaitlynne Wenz, MD [facility]   If 7PM-7AM, please contact night-coverage [email or web address]  08/11/2024, 8:55 AM

## 2024-08-11 NOTE — TOC Progression Note (Signed)
 Transition of Care [facility]) - Progression Note    Patient Details  Name: Alfred Carney MRN: [id number] Date of Birth: May 07, 1951  [facility] of [facility] CM/SW Contact  Roxie KANDICE Stain, RN Phone Number: 08/11/2024, 3:48 PM  Clinical Narrative:    Spoke to patient wife, Alfred Carney, regarding home iv antibiotics. Alfred Carney will meet with [facility] for the education. Darleene with [facility] will accept for home health RN needs.    Expected Discharge Plan: Home w Home Health Services Barriers to Discharge: Continued Medical Work up               Expected Discharge Plan and Services   Discharge Planning Services: CM Consult   Living arrangements for the past 2 months: Single Family Home                           HH Arranged: RN [facility] Agency: [facility] Date [facility] Agency Contacted: 08/11/24 Time HH Agency Contacted: 1547 Representative spoke with at [facility] Agency: Darleene   Social Drivers of Health (SDOH) Interventions SDOH Screenings   Food Insecurity: No Food Insecurity (08/05/2024)  Housing: Low Risk  (08/05/2024)  Transportation Needs: No Transportation Needs (08/05/2024)  Utilities: Not At Risk (08/05/2024)  Alcohol Screen: Low Risk  (05/19/2024)  Depression (PHQ2-9): Medium Risk (06/30/2024)  Financial Resource Strain: Low Risk  (05/19/2024)  Physical Activity: Inactive (05/19/2024)  Social Connections: Moderately Integrated (08/05/2024)  Stress: No Stress Concern Present (05/19/2024)  Tobacco Use: Medium Risk (08/04/2024)  Health Literacy: Adequate Health Literacy (05/19/2024)    Readmission Risk Interventions     No data to display

## 2024-08-11 NOTE — Progress Notes (Signed)
 At bedside for PICC placement. Pt states he prefers to wait until tomorrow for placement. Currently up in the chair finishing dinner. Consent obtained and in chart. RN aware.

## 2024-08-11 NOTE — Progress Notes (Signed)
 PIV consult: BUE assessed with US . Pt has limited PIV options. Please consider PICC for prolonged intravenous needs.

## 2024-08-11 NOTE — Progress Notes (Signed)
 Physical Therapy Treatment Patient Details Name: Alfred Carney MRN: [id number] DOB: [birth year] Today's Date: 08/11/2024   History of Present Illness 73 y.o. male admitted 08/04/2024 with abdominal pain. Workup revealed inflammatory changes at aortobifemoral bypass graft, concern for appendicitis. Plan for conservative management on abx for now. PMH includes TAA repair, NIDDM, PAD, HLD, HTN, gout, BPH, COPD (2L at baseline), HFpEF; of note, pt reports h/o orthostatic hypotension recently causing BLE weakness and lightheadedness.   PT Comments  Pt progressing with mobility. Today's session focused on ambulation for improving strength and activity tolerance; pt ambulating independently to/from bathroom, hallway ambulation with rollator and intermittent CGA for balance due to increased BLE instability/fatigue with increased distance. Pt remains limited by generalized weakness, decreased activity tolerance, and impaired balance strategies/postural reactions. Recommend follow up with OPPT, though pt not interested in follow up PT. Will continue to follow acutely to address established goals.  SpO2 91% on 2L O2 [place] with activity (via portal finger pulse ox)     If plan is discharge home, recommend the following: Assistance with cooking/housework;Assist for transportation;Help with stairs or ramp for entrance   Can travel by private vehicle      Yes  Equipment Recommendations  None recommended by PT    Recommendations for Other Services       Precautions / Restrictions Precautions Precautions: Fall;Other (comment) Recall of Precautions/Restrictions: Intact Precaution/Restrictions Comments: 2L O2 baseline; h/o orthostatic hypotension Restrictions Weight Bearing Restrictions Per Provider Order: No     Mobility  Bed Mobility Overal bed mobility: Modified Independent Bed Mobility: Supine to Sit, Sit to Supine                Transfers Overall transfer level: Modified  independent Equipment used: None, Rollator (4 wheels) Transfers: Sit to/from Stand             General transfer comment: mod indep sit<>stand from EOB, low toilet height (without DME) and rollator seat    Ambulation/Gait Ambulation/Gait assistance: Supervision, Contact guard assist Gait Distance (Feet): 40 Feet (+ 120' + 120') Assistive device: None, IV Pole, Rollator (4 wheels) Gait Pattern/deviations: Step-through pattern, Decreased stride length, Trunk flexed Gait velocity: decreased     General Gait Details: pt taking steps without DME, then grabbing IV pole to roll in/out of bathroom to void, supervision for safety. pt using rollator for hallway ambulation, opting to hold rollator behind him in case my knees buckle, 1x seated rest break secondary to c/o LE fatigue, CGA for safety/lines due to h/o knee instability and orthostatic hypotension; no c/o dizziness   Stairs             Wheelchair Mobility     Tilt Bed    Modified Rankin (Stroke Patients Only)       Balance Overall balance assessment: Needs assistance Sitting-balance support: No upper extremity supported Sitting balance-Leahy Scale: Good Sitting balance - Comments: pt able to don/doff bilateral shoes sitting EOB without assist   Standing balance support: No upper extremity supported, During functional activity Standing balance-Leahy Scale: Fair Standing balance comment: can ambulate without DME                            Communication    Cognition Arousal: Alert Behavior During Therapy: WFL for tasks assessed/performed   PT - Cognitive impairments: No apparent impairments  Following commands: Intact      Cueing    Exercises      General Comments General comments (skin integrity, edema, etc.): increased time discussing follow-up PT options given pt's c/o BLE weakness; pt reports prior bad experience with HHPT (they didn't show or came at  diff time than appt), also not interested in OPPT for various reasons; he reports he has two MD appts scheduled regarding his concerns that he has parkinson's disease. further educ re: role of acute PT, POC, activity recommendations, DME use, fall risk reduction, d/c needs      Pertinent Vitals/Pain Pain Assessment Pain Assessment: No/denies pain    Home Living                          Prior Function            PT Goals (current goals can now be found in the care plan section) Progress towards PT goals: Progressing toward goals    Frequency    Min 2X/week      PT Plan      Co-evaluation              AM-PAC PT 6 Clicks Mobility   Outcome Measure  Help needed turning from your back to your side while in a flat bed without using bedrails?: None Help needed moving from lying on your back to sitting on the side of a flat bed without using bedrails?: None Help needed moving to and from a bed to a chair (including a wheelchair)?: None Help needed standing up from a chair using your arms (e.g., wheelchair or bedside chair)?: None Help needed to walk in hospital room?: A Little Help needed climbing 3-5 steps with a railing? : A Little 6 Click Score: 22    End of Session Equipment Utilized During Treatment: Gait belt;Oxygen  Activity Tolerance: Patient tolerated treatment well Patient left: in bed;with call bell/phone within reach (pt received without bed alarm set, nursing has allowed him to get up in room) Nurse Communication: Mobility status PT Visit Diagnosis: Unsteadiness on feet (R26.81);Other abnormalities of gait and mobility (R26.89);Muscle weakness (generalized) (M62.81)     Time: 8689-8668 PT Time Calculation (min) (ACUTE ONLY): 21 min  Charges:    $Therapeutic Exercise: 8-22 mins PT General Charges $$ ACUTE PT VISIT: 1 Visit                     Darice Almas, PT, DPT Acute Rehabilitation Services  Personal: Secure Chat Rehab Office:  [phone]  Darice LITTIE Almas 08/11/2024, 2:53 PM

## 2024-08-11 NOTE — Consult Note (Signed)
 [facility]    Date of Admission:  08/04/2024     Reason for Consult: graft infection    Referring Provider: Madelyne     Lines:  Peripheral iv's  Abx: 11/23-11/26 cefepime  11/19-c vanc 11/19-11/23; 11/26-c piptazo        Assessment: 73 yo male with copd on 2 liters home o2, HFpEF, ckd3, obesity, dm2, pad and AAA s/p aorta-right femoral to left external iliac artery bypass with implantation of two right renal arteries and one left renal artery on 03/15/2003, admitted 11/19 a week malaise, nausea, right sided abd pain, referred by pcp for abd ct pelv that suggest inflammatory changes on the right limb of the bypass graft as well as thickening of appendix  Repeat ct scan here a week after initial ct had shown no changes  Both vascular surgery and general surgery evaluated. Unclear if primary appendicitis with vascular graft involvement  Of note, in 2019 there was also concern for appendicitis with inflammatory changes of the right branch of the graft -- patient received iv vanc/piptazo for a short course for his appendicitis. 01/2018 the appendix remains tethered to the graft but with resolution of inflammatory changes   At this time no immediate need/indication for surgery of either and agree with recurrence and morbid implication reasonable to treat with abx and see. Vascular team did express wish to suppress chronically  Plan: Piptazo 6 weeks opat, then transition to oral suppression maybe augmentin  (seems all primary gi spectrum bug) vs cipro/flagyl  Maintain standard isolation precaution Id will sign off Discussed with primary team  11/19 bcx negative  OPAT Orders Discharge antibiotics to be given via PICC line Discharge antibiotics: Piptazo continuous daily infusion 13.5 gram    Duration: 6 weeks End Date: 09/16/24  [facility] Per Protocol:  Home health RN for IV administration and teaching; PICC line care and labs.    Labs  weekly while on IV antibiotics: _x_ CBC with differential __ BMP _x_ CMP __ CRP __ ESR __ Vancomycin  trough __ CK  _x_ Please pull PIC at completion of IV antibiotics __ Please leave PIC in place until doctor has seen patient or been notified  Fax weekly labs to [phone]  Clinic Follow Up Appt: 09/20/24 @ 245  @  [facility] [address], [place], [place] [address] Phone: [phone]      ------------------------------------------------ Principal Problem:   Intra-abdominal infection Active Problems:   Chronic hypoxic respiratory failure (HCC)   Chronic diastolic CHF (congestive heart failure) (HCC)   Obesity, Class III, BMI 40-49.9 (morbid obesity) (HCC)   Type 2 diabetes mellitus with hyperglycemia, without long-term current use of insulin  (HCC)   History of repair of thoracoabdominal aortic aneurysm (TAAA)   Acute renal failure superimposed on stage 3a chronic kidney disease (HCC)   Chronic obstructive pulmonary disease (HCC)    HPI: Alfred Carney is a 73 y.o. male copd on 2 liters home o2, HFpEF, ckd3, obesity, dm2, pad and AAA s/p aorta-right femoral to left external iliac artery bypass with implantation of two right renal arteries and one left renal artery on 03/15/2003, admitted 11/19 a week malaise, nausea, right sided abd pain, referred by pcp for abd ct pelv that suggest inflammatory changes on the right limb of the bypass graft as well as thickening of appendix  Repeat ct scan here a week after initial ct had shown no changes  Both vascular surgery and general surgery  evaluated. Unclear if primary appendicitis with vascular graft involvement  Of note, in 2019 there was also concern for appendicitis with inflammatory changes of the right branch of the graft -- patient received iv vanc/piptazo for a short course for his appendicitis. 01/2018 the appendix remains tethered to the graft but with resolution of inflammatory changes  Afebrile here Ct  reviewed Consults notes reviewed  Pain better  No complaint     Family History  Problem Relation Age of Onset   Osteoarthritis Mother    Diabetes Mother    Pulmonary embolism Mother    Heart disease Father        Coronary Artery Disease   Stroke Father    Multiple sclerosis Sister    Factor V Leiden deficiency Sister    Emphysema Sister    Hyperlipidemia Brother    Heart attack Maternal Grandmother    Lung cancer Maternal Grandfather        smoked    Social History   Tobacco Use   Smoking status: Former    Current packs/day: 0.00    Average packs/day: 1 pack/day for 35.0 years (35.0 ttl pk-yrs)    Types: Cigarettes    Start date: 09/17/1971    Quit date: 09/16/2006    Years since quitting: 17.9   Smokeless tobacco: Never  Vaping Use   Vaping status: Never Used  Substance Use Topics   Alcohol use: No    Alcohol/week: 0.0 standard drinks of alcohol   Drug use: No    Allergies  Allergen Reactions   Glucotrol  Ammon.bach ] Other (See Comments)    Dizziness    Jardiance  [Empagliflozin ] Other (See Comments)    Stomach pain    Review of Systems: ROS All Other ROS was negative, except mentioned above   Past Medical History:  Diagnosis Date   Chronic obstructive pulmonary disease (HCC) 2008   Moderate   Colon polyps    Community acquired pneumonia    Coronary artery disease    Degenerative joint disease    Diabetes mellitus without complication (HCC)    type 2   Emphysema lung (HCC)    Enlarged liver    fatty liver by '09 CT   Gastroesophageal reflux disease    Gout    H/O hiatal hernia    History of Rocky [place] spotted fever    Possible history of [place] Spotted Fever   Hyperlipidemia    Hypertension    Hypokalemia    diuretic induced, resolved   Microcytic anemia    iron pills   Morbid obesity (HCC)    Shortness of breath    Skin cancer    skin cancer lip removed   Sleep apnea    not currently using CPAP 05/20/13   Thoracoabdominal  aneurysm    status post vascular surgery repair       Scheduled Meds:  allopurinol   200 mg Oral Daily   enoxaparin  (LOVENOX ) injection  60 mg Subcutaneous Q24H   insulin  aspart  0-15 Units Subcutaneous TID WC   insulin  aspart  0-5 Units Subcutaneous QHS   metoprolol  tartrate  25 mg Oral Q1500   ondansetron   4 mg Oral Once   pantoprazole   40 mg Oral Daily   pravastatin   40 mg Oral q1800   tamsulosin   0.4 mg Oral QHS   Continuous Infusions:  magnesium  sulfate bolus IVPB 2 g (08/11/24 1348)   piperacillin -tazobactam (ZOSYN )  IV     promethazine  (PHENERGAN ) injection (IM or IVPB)  PRN Meds:.acetaminophen  **OR** acetaminophen , bisacodyl , HYDROmorphone  (DILAUDID ) injection, ipratropium-albuterol , magnesium  hydroxide, promethazine  (PHENERGAN ) injection (IM or IVPB)   OBJECTIVE: Blood pressure (!) 144/57, pulse 80, temperature 98.4 F (36.9 C), temperature source Oral, resp. rate 19, height 5' 9 (1.753 m), weight 124.8 kg, SpO2 95%.  Physical Exam  General/constitutional: no distress, pleasant HEENT: Normocephalic, PER, Conj Clear, EOMI, Oropharynx clear Neck supple CV: rrr no mrg Lungs: clear to auscultation, normal respiratory effort Abd: Soft, Nontender Ext: no edema Skin: No Rash Neuro: nonfocal MSK: no peripheral joint swelling/tenderness/warmth; back spines nontender    Lab Results Lab Results  Component Value Date   WBC 9.8 08/10/2024   HGB 12.0 (L) 08/10/2024   HCT 36.9 (L) 08/10/2024   MCV 85.8 08/10/2024   PLT 354 08/10/2024    Lab Results  Component Value Date   CREATININE 1.61 (H) 08/11/2024   BUN 14 08/11/2024   NA 139 08/11/2024   K 3.4 (L) 08/11/2024   CL 97 (L) 08/11/2024   CO2 29 08/11/2024    Lab Results  Component Value Date   ALT 12 08/07/2024   AST 15 08/07/2024   ALKPHOS 51 08/07/2024   BILITOT 1.1 08/07/2024      Microbiology: Recent Results (from the past 240 hours)  Blood culture (routine x 2)     Status: None    Collection Time: 08/04/24  5:22 PM   Specimen: BLOOD  Result Value Ref Range Status   Specimen Description BLOOD LEFT ANTECUBITAL  Final   Special Requests   Final    BOTTLES DRAWN AEROBIC ONLY Blood Culture results may not be optimal due to an inadequate volume of blood received in culture bottles   Culture   Final    NO GROWTH 5 DAYS Performed at [facility], [address] [place], [place] [address]    Report Status 08/09/2024 FINAL  Final  Blood culture (routine x 2)     Status: None   Collection Time: 08/04/24  9:05 PM   Specimen: BLOOD  Result Value Ref Range Status   Specimen Description BLOOD SITE NOT SPECIFIED  Final   Special Requests   Final    BOTTLES DRAWN AEROBIC ONLY Blood Culture adequate volume   Culture   Final    NO GROWTH 5 DAYS Performed at [facility], [address] [place], [place] [address]    Report Status 08/09/2024 FINAL  Final     Serology:    Imaging: If present, new imagings (plain films, ct scans, and mri) have been personally visualized and interpreted; radiology reports have been reviewed. Decision making incorporated into the Impression / Recommendations.  11/25 abd pelv ct with contrast 1. No substantial interval change since 08/03/2024. As noted previously, there is a relatively thick collar of soft tissue attenuation circumferentially encasing the right iliac bypass graft with edema/inflammation in the posterior mesentery immediately adjacent to the right iliac graft. Features are highly suspicious for infection/inflammation of the iliac bypass limb with subsequent tethering of small bowel and appendix into the infectious/inflammatory process. The appendix remains thickened and ill-defined measuring up to at least 10 mm diameter with periappendiceal edema/inflammation. Imaging features are generally stable in the interval since the prior study and while the appearance of the appendix is felt to be secondary,  primary appendicitis is not excluded. 2. Mild right hydroureteronephrosis with right ureteral obstruction identified at the level of the inflammatory changes seen in the right posterior pelvis. 3. Moderate hiatal hernia. 4. Left  colonic diverticulosis without diverticulitis. 5.  Aortic Atherosclerosis  Constance ONEIDA Passer, MD [facility] for Infectious [facility] [phone] pager    08/11/2024, 2:31 PM

## 2024-08-11 NOTE — Progress Notes (Addendum)
 Progress Note    08/11/2024 8:36 AM * No surgery found *  Subjective:  sitting up in chair. Reports 5/10 abdominal pain this morning but overall better than it has been   Vitals:   08/11/24 0331 08/11/24 0757  BP: (!) 107/51 (!) 144/57  Pulse: 80 80  Resp: 18 19  Temp: 97.7 F (36.5 C) 98.4 F (36.9 C)  SpO2: 98% 95%   Physical Exam: General: in no acute distress, appears comfortable Cardiac:  regular Lungs:  non labored Abdomen:  soft, non tender Neurologic: alert and oriented  CBC    Component Value Date/Time   WBC 9.8 08/10/2024 0521   RBC 4.30 08/10/2024 0521   HGB 12.0 (L) 08/10/2024 0521   HCT 36.9 (L) 08/10/2024 0521   PLT 354 08/10/2024 0521   MCV 85.8 08/10/2024 0521   MCH 27.9 08/10/2024 0521   MCHC 32.5 08/10/2024 0521   RDW 13.6 08/10/2024 0521   LYMPHSABS 2.9 08/07/2024 0319   MONOABS 1.0 08/07/2024 0319   EOSABS 0.1 08/07/2024 0319   BASOSABS 0.1 08/07/2024 0319    BMET    Component Value Date/Time   NA 140 08/10/2024 0521   NA 140 06/15/2024 0927   K 3.5 08/10/2024 0521   CL 97 (L) 08/10/2024 0521   CO2 30 08/10/2024 0521   GLUCOSE 132 (H) 08/10/2024 0521   BUN 18 08/10/2024 0521   BUN 22 06/15/2024 0927   CREATININE 1.81 (H) 08/10/2024 0521   CREATININE 1.16 01/13/2015 0936   CALCIUM  9.1 08/10/2024 0521   GFRNONAA 39 (L) 08/10/2024 0521   GFRNONAA 53 (L) 07/19/2014 1156   GFRAA >60 06/11/2020 0754   GFRAA 61 07/19/2014 1156    INR    Component Value Date/Time   INR 1.1 06/10/2020 0048    No intake or output data in the 24 hours ending 08/11/24 0836   Assessment/Plan:  73 y.o. 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  He has CT scan on admission with evidence of tethering loops of small bowel and appendix along the right limb of his bypass graft with inflammation.  Blood cultures no growth to date.  White blood  cell count has normalized. He has remained afebrile. Repeat CT shows no interval change from CT on admission. Continued evidence of inflammation around right limb of bypass graft/ appendix. Continue broad-spectrum antibiotics. Again recommend ID consultation for aggressive outpatient long course of antibiotics. No surgical intervention planned as this would be very extensive and highly morbid. This has been discussed with patient at length and he is agreeable and understanding of the plan. We will plan to repeat his CT scan with about 6 weeks and have him follow up with Dr. Gretta in our office.    Teretha Damme, PA-C [facility] [phone] 08/11/2024 8:36 AM  I have seen and evaluated the patient. I agree with the PA note as documented above.  Repeat CT reviewed from yesterday with no significant interval change.  Continues to have stranding with thickened appendix tethered to the right limb of the graft with inflammation.  Cannot rule out appendicitis.  Appreciate general surgery following.  Again would appreciate ID input for antibiotic plan as an outpatient but needs to be very aggressive.  No operative plans from vascular surgery standpoint.  This was a type IV thoracoabdominal repair with renal implant involving thoracotomy and removing the graft is really  not feasible and would put him on dialysis and likely nonsurvivable event.  I have reviewed his images with multiple partners.  States overall he is feeling better since admission.  No bacteremia and blood cultures no growth.  White count normal.  Will repeat CT as an outpatient and likely 6 weeks.  Lonni DOROTHA Gaskins, MD [facility] [place] Office: [phone]

## 2024-08-11 NOTE — Progress Notes (Signed)
 PHARMACY CONSULT NOTE FOR:  OUTPATIENT  PARENTERAL ANTIBIOTIC THERAPY (OPAT)  Indication: Bypass graft infection  Regimen: Piperacillin -tazobactam 13.5 gm every 24 hours as a continuous infusion End date: 09/16/24   IV antibiotic discharge orders are pended. To discharging provider:  please sign these orders via discharge navigator,  Select New Orders & click on the button choice - Manage This Unsigned Work.     Thank you for allowing pharmacy to be a part of this patient's care.  Damien Quiet, PharmD, BCPS, BCIDP Infectious Diseases Clinical Pharmacist Phone: [phone] 08/11/2024, 2:34 PM

## 2024-08-12 DIAGNOSIS — B999 Unspecified infectious disease: Secondary | ICD-10-CM | POA: Diagnosis not present

## 2024-08-12 LAB — BASIC METABOLIC PANEL WITH GFR
Anion gap: 10 (ref 5–15)
BUN: 15 mg/dL (ref 8–23)
CO2: 29 mmol/L (ref 22–32)
Calcium: 9.1 mg/dL (ref 8.9–10.3)
Chloride: 101 mmol/L (ref 98–111)
Creatinine, Ser: 1.59 mg/dL — ABNORMAL HIGH (ref 0.61–1.24)
GFR, Estimated: 45 mL/min — ABNORMAL LOW (ref >60)
Glucose, Bld: 145 mg/dL — ABNORMAL HIGH (ref 70–99)
Potassium: 3.9 mmol/L (ref 3.5–5.1)
Sodium: 140 mmol/L (ref 135–145)

## 2024-08-12 LAB — GLUCOSE, CAPILLARY
Glucose-Capillary: 153 mg/dL — ABNORMAL HIGH (ref 70–99)
Glucose-Capillary: 149 mg/dL — ABNORMAL HIGH (ref 70–99)

## 2024-08-12 MED ORDER — FUROSEMIDE 20 MG PO TABS
40.0000 mg | ORAL_TABLET | Freq: Every day | ORAL | 0 refills | Status: DC | PRN
Start: 2024-08-12 — End: 2024-08-12

## 2024-08-12 MED ORDER — HEPARIN SOD (PORK) LOCK FLUSH 100 UNIT/ML IV SOLN
250.0000 [IU] | INTRAVENOUS | Status: AC | PRN
  Administered 2024-08-12: 250 [IU]

## 2024-08-12 MED ORDER — CHLORHEXIDINE GLUCONATE CLOTH 2 % EX PADS: 6.0000 | MEDICATED_PAD | Freq: Every day | CUTANEOUS | Status: DC

## 2024-08-12 MED ORDER — SODIUM CHLORIDE 0.9% FLUSH: 10.0000 mL | Freq: Two times a day (BID) | INTRAVENOUS | Status: DC

## 2024-08-12 MED ORDER — OXYCODONE HCL 5 MG PO CAPS: 5.0000 mg | ORAL_CAPSULE | Freq: Four times a day (QID) | ORAL | 0 refills | Status: DC | PRN

## 2024-08-12 MED ORDER — SODIUM CHLORIDE 0.9% FLUSH: 10.0000 mL | INTRAVENOUS | Status: DC | PRN

## 2024-08-12 MED ORDER — FUROSEMIDE 20 MG PO TABS: 40.0000 mg | ORAL_TABLET | Freq: Every day | ORAL | 0 refills | Status: AC | PRN

## 2024-08-12 MED ORDER — OXYCODONE HCL 5 MG PO CAPS: 5.0000 mg | ORAL_CAPSULE | Freq: Four times a day (QID) | ORAL | 0 refills | Status: AC | PRN

## 2024-08-12 NOTE — TOC Progression Note (Signed)
 Transition of Care [facility]) - Progression Note    Patient Details  Name: Alfred Carney MRN: [id number] Date of Birth: September 04, 1951  [facility] CM/SW Contact  Rosaline JONELLE Joe, RN Phone Number: 08/12/2024, 11:41 AM  Clinical Narrative:    CM called and spoke with Holley herring, RNCM with [employer] and she is aware that patient is discharging home and she plans to send medications to the home this afternoon.  I asked bedside nursing to provide Right arm extension on his PICC before patient goes home today.  Patient is aware and updated at the bedside regarding plans to return home.  He received IV infusion teaching yesterday and will hook up his infusion at the home when it arrives via [place].  I sent [facility] a message that patient is discharging home.   Expected Discharge Plan: Home w Home Health Services Barriers to Discharge: Continued Medical Work up               Expected Discharge Plan and Services   Discharge Planning Services: CM Consult   Living arrangements for the past 2 months: Single Family Home                           HH Arranged: RN [facility] Agency: [facility] Date [facility] Agency Contacted: 08/11/24 Time HH Agency Contacted: [phone] Representative spoke with at [facility] Agency: [place]le[place]   Social Drivers of Health (SDOH) Interventions SDOH Screenings   Food Insecurity: No Food Insecurity (08/05/2024)  Housing: Low Risk  (08/05/2024)  Transportation Needs: No Transportation Needs (08/05/2024)  Utilities: Not At Risk (08/05/2024)  Alcohol Screen: Low Risk  (05/19/2024)  Depression (PHQ2-9): Medium Risk (06/30/2024)  Financial Resource Strain: Low Risk  (05/19/2024)  Physical Activity: Inactive (05/19/2024)  Social Connections: Moderately Integrated (08/05/2024)  Stress: No Stress Concern Present (05/19/2024)  Tobacco Use: Medium Risk (08/04/2024)  Health Literacy: Adequate Health Literacy (05/19/2024)    Readmission Risk Interventions     No data  to display

## 2024-08-12 NOTE — Discharge Summary (Signed)
 Physician Discharge Summary   Patient: Alfred Carney MRN: [id number] DOB: Sep 24, 1950  Admit date:     08/04/2024  Discharge date: 08/12/24  Discharge Physician: Owen DELENA Lore   PCP: Corwin Antu, FNP   Recommendations at discharge:    Needs IV Zosyn  for 6 weeks.  Needs follow up with ID, will need oral antibiotics after he complete Zosyn  for 6 weeks.  Needs repeat CT scan ion 6 weeks.  Follow up with PCP for resumption Of Arb and spironolactone  depending on renal function   Discharge Diagnoses: Principal Problem:   Intra-abdominal infection Active Problems:   Chronic hypoxic respiratory failure (HCC)   Chronic diastolic CHF (congestive heart failure) (HCC)   Obesity, Class III, BMI 40-49.9 (morbid obesity) (HCC)   Type 2 diabetes mellitus with hyperglycemia, without long-term current use of insulin  (HCC)   History of repair of thoracoabdominal aortic aneurysm (TAAA)   Acute renal failure superimposed on stage 3a chronic kidney disease (HCC)   Chronic obstructive pulmonary disease (HCC)  Resolved Problems:   * No resolved hospital problems. *  Hospital Course: 73 year old with past medical history significant for COPD, chronic hypoxic respiratory failure on 2 L nasal cannula, chronic diastolic heart failure, hypertension, hyperlipidemia, CKD 3A, class III obesity, diabetes type 2, OSA, GERD, PAD status post bypass, CAD, carotid stenosis and thoracoabdominal aneurysm s/p repair who presented to the ED for evaluation of abdominal pain.  Patient reports 4 to 5 days of nagging, intermittent right lower quadrant abdominal pain.  Recently seen by PCP 11/17 for evaluation and a CT abdomen and pelvis with contrast was obtained on 11/18 for further evaluation.  Results show evidence of inflammatory changes around the right lower limb of his bypass graft as well as thickening of his appendix concerning for infection.  Patient was called to present to the ED, labs showed white blood cell of  15, creatinine 1.7, started on broad-spectrum antibiotics, seen by vascular surgery General Surgery consultation.    Assessment and Plan: 1-Intra-abdominal infection suspected appendicitis PAD status post bypass History of thoracoabdominal aneurysmal - Patient presented with 1 week of intermittent right lower quadrant pain with associated nausea. - CT abdomen and pelvis which was ordered by PCP showed inflammation around the right limb of the graft including some tethering of the loop of the small bowel, tip of the appendix and the distal ureter to the right iliac limb of the bypass consistent with adhesions, and inflammation. - Vascular and general surgery following. - Repeated CT abdomen and pelvis with contrast 11/25th: Show no substantial interval change since 11/18.  Relatively thick soft tissue attenuation circumferentially encasing the right iliac bypass graft with edema and inflammation in the posterior mesentery immediately adjacent to the right iliac graft.  Features are highly suspicious for infection/inflammation of the iliac bypass limb with subsequent tethering  of the small bowel and appendix. - Patient was felt to be a poor surgical candidate, recommendation was to continue broad-spectrum antibiotics and ID consultation. ID consulted plan to proceed with IV antibiotics at discharge ID recommended IV Zosyn  for 6 weeks, will need there after oral antibiotics suppression. Needs follow up with ID.  Plan to discharge today, abdominal pain stable, tolerating diet. Clear by vascular    Diabetes type 2 with hyperglycemia: - Continue sliding scale insulin  -resume home meds.   AKI on CKD 3 AA Creatinine baseline 1.2--- 1.5 Continue to hold ARB Aldactone  and vancomycin  was discontinue Renal function improving, down to 1.6 Holding ARB at discharge.  Hypokalemia: replaced orally. Hypomagnesemia: Replace IV   Hypertension - Continue metoprolol .  Holding ARB due to AKI   Chronic  diastolic heart failure Chronic lower extremity swelling - Continue metoprolol  Holding Lasix , plan to resume PRN>    COPD/chronic hypoxic respiratory failure Chronic 2 L of oxygen  Continue 2 L of oxygen  supplementation As needed DuoNeb   History of paroxysmal SVT - Continue metoprolol    Hyperlipidemia, CAD: - Continue statin   Gout: - Continue allopurinol    BPH: Continue tamsulosin    OSA: CPAP.   Class III obesity Lifestyle modification          Consultants: Vascular, General Sx, ID Procedures performed: none Disposition: Home Diet recommendation:  Cardiac and Carb modified diet DISCHARGE MEDICATION: Allergies as of 08/12/2024       Reactions   Glucotrol  [glipizide ] Other (See Comments)   Dizziness    Jardiance  [empagliflozin ] Other (See Comments)   Stomach pain        Medication List     STOP taking these medications    ibuprofen  200 MG tablet Commonly known as: ADVIL    irbesartan  150 MG tablet Commonly known as: Avapro    meloxicam  7.5 MG tablet Commonly known as: MOBIC    potassium chloride  SA 20 MEQ tablet Commonly known as: KLOR-CON  M   spironolactone  25 MG tablet Commonly known as: ALDACTONE        TAKE these medications    albuterol  108 (90 Base) MCG/ACT inhaler Commonly known as: VENTOLIN  HFA Inhale 2 puffs into the lungs every 4 (four) hours as needed for wheezing or shortness of breath.   allopurinol  100 MG tablet Commonly known as: ZYLOPRIM  Take 2 tablets (200 mg total) by mouth daily.   aspirin  EC 81 MG tablet Take 1 tablet (81 mg total) by mouth daily. Swallow whole.   cyanocobalamin  1000 MCG/ML injection Commonly known as: VITAMIN B12 Inject 1,000 mcg into the muscle every 30 (thirty) days.   furosemide  20 MG tablet Commonly known as: LASIX  Take 2 tablets (40 mg total) by mouth daily as needed (increase fluid retension.). What changed:  when to take this reasons to take this   glucose blood test strip Use as  instructed   Lancet Device Misc 1 Lancet by Does not apply route daily as needed.   metoprolol  tartrate 25 MG tablet Commonly known as: LOPRESSOR  Take 1 tablet (25 mg total) by mouth daily in the afternoon. What changed: when to take this   omeprazole  20 MG capsule Commonly known as: PRILOSEC Take 1 capsule (20 mg total) by mouth daily.   oxycodone  5 MG capsule Commonly known as: OXY-IR Take 1 capsule (5 mg total) by mouth every 6 (six) hours as needed for up to 5 days.   OXYGEN  Inhale 2-4 L into the lungs continuous. 2L/min baseline, increased to 4L/min if needed for exertion.   piperacillin -tazobactam IVPB Commonly known as: ZOSYN  Inject 13.5 g into the vein daily. Indication:  Bypass graft infection  First Dose: Yes Last Day of Therapy:  09/16/24 Labs - Once weekly:  CBC/D and BMP, Labs - Once weekly: ESR and CRP Method of administration: Elastomeric (Continuous infusion) Method of administration may be changed at the discretion of home infusion pharmacist based upon assessment of the patient and/or caregiver's ability to self-administer the medication ordered.   pravastatin  40 MG tablet Commonly known as: PRAVACHOL  Take 1 tablet by mouth once daily What changed: when to take this   tamsulosin  0.4 MG Caps capsule Commonly known as: FLOMAX  Take 1 capsule (  0.4 mg total) by mouth at bedtime.               Discharge Care Instructions  (From admission, onward)           Start     Ordered   08/11/24 0000  Change dressing on IV access line weekly and PRN  (Home infusion instructions - [facility] )        08/11/24 1436            Follow-up Information     Gretta Lonni PARAS, MD Follow up in 6 week(s).   Specialty: Vascular Surgery Why: The office will call you with your appointment Contact information: [address] [place] [address] [phone]         [facility] Follow up.   Specialty: Home Health  Services Why: home health has been arranged. They will contact you to schedule apt. Contact information: [address] [place] [address] [phone]         Overton Faith T, MD Follow up in 4 week(s).   Specialty: Infectious Diseases Contact information: [address] [place] [address] [phone]         [facility] Follow up.   Why: [facility] will provide home infusion medications.               Discharge Exam: Filed Weights   08/04/24 1238  Weight: 124.8 kg   General; NAD  Condition at discharge: stable  The results of significant diagnostics from this hospitalization (including imaging, microbiology, ancillary and laboratory) are listed below for reference.   Imaging Studies: US  EKG SITE RITE Result Date: 08/11/2024 If Site Rite image not attached, placement could not be confirmed due to current cardiac rhythm.  CT ABDOMEN PELVIS W CONTRAST Result Date: 08/10/2024 CLINICAL DATA:  Appendicitis suspected. EXAM: CT ABDOMEN AND PELVIS WITH CONTRAST TECHNIQUE: Multidetector CT imaging of the abdomen and pelvis was performed using the standard protocol following bolus administration of intravenous contrast. RADIATION DOSE REDUCTION: This exam was performed according to the departmental dose-optimization program which includes automated exposure control, adjustment of the mA and/or kV according to patient size and/or use of iterative reconstruction technique. CONTRAST:  OMNIPAQUE  IOHEXOL  350 MG/ML SOLN COMPARISON:  08/03/2024 FINDINGS: Lower chest: Centrilobular emphsyema noted. Calcified granuloma again noted left lung base with tiny calcified granuloma identified right lower lobe. Hepatobiliary: No suspicious focal abnormality within the liver parenchyma. Gallbladder is surgically absent. No intrahepatic or extrahepatic biliary dilation. Pancreas: No focal mass lesion. No dilatation of the main duct. No intraparenchymal cyst. No  peripancreatic edema. Spleen: No splenomegaly. No suspicious focal mass lesion. Adrenals/Urinary Tract: No adrenal nodule or mass. Cortical scarring noted in both kidneys with tiny bilateral nonobstructing renal stones. Mild right hydroureteronephrosis evident with right ureteral obstruction identified at the level of the inflammatory changes seen in the right pelvis. The urinary bladder appears normal for the degree of distention. Stomach/Bowel: Moderate hiatal hernia. Duodenum is normally positioned as is the ligament of Treitz. No small bowel obstruction. Small bowel appears to be tethered to the region of the right common iliac artery bypass graft. As noted previously, the appendix appears to be tethered in this region in the appendix is thickened and ill-defined measuring up to at least 10 mm diameter with periappendiceal edema/inflammation. The terminal ileum is normal. No gross colonic mass. No colonic wall thickening. Diverticular changes are noted in the left colon without evidence of diverticulitis. Vascular/Lymphatic:  There is advanced atherosclerotic calcification of the abdominal aorta without aneurysm. Patient is status post aortic bypass grafting to the level of the left external iliac artery and right common femoral artery. As noted previously, there is a relatively thick collar of soft tissue attenuation circumferentially encasing the right iliac bypass graft. As before, there is edema/inflammation in the posterior mesentery immediately adjacent to the right iliac graft which tethers small bowel and also the appendix. The appendiceal tip may be seen just anterior and inferior to the graft bifurcation on image 60/3. Imaging features are generally stable in the interval since the prior study. Reproductive: The prostate gland and seminal vesicles are unremarkable. Other: No intraperitoneal free fluid. Musculoskeletal: Status post left hip replacement. No worrisome lytic or sclerotic osseous  abnormality. IMPRESSION: 1. No substantial interval change since 08/03/2024. As noted previously, there is a relatively thick collar of soft tissue attenuation circumferentially encasing the right iliac bypass graft with edema/inflammation in the posterior mesentery immediately adjacent to the right iliac graft. Features are highly suspicious for infection/inflammation of the iliac bypass limb with subsequent tethering of small bowel and appendix into the infectious/inflammatory process. The appendix remains thickened and ill-defined measuring up to at least 10 mm diameter with periappendiceal edema/inflammation. Imaging features are generally stable in the interval since the prior study and while the appearance of the appendix is felt to be secondary, primary appendicitis is not excluded. 2. Mild right hydroureteronephrosis with right ureteral obstruction identified at the level of the inflammatory changes seen in the right posterior pelvis. 3. Moderate hiatal hernia. 4. Left colonic diverticulosis without diverticulitis. 5.  Aortic Atherosclerosis (ICD10-I70.0). Electronically Signed   By: Camellia Candle M.D.   On: 08/10/2024 09:08   CT ABDOMEN PELVIS W CONTRAST Result Date: 08/03/2024 CLINICAL DATA:  Abdominal pain.  Hernia suspected. EXAM: CT ABDOMEN AND PELVIS WITH CONTRAST TECHNIQUE: Multidetector CT imaging of the abdomen and pelvis was performed using the standard protocol following bolus administration of intravenous contrast. RADIATION DOSE REDUCTION: This exam was performed according to the departmental dose-optimization program which includes automated exposure control, adjustment of the mA and/or kV according to patient size and/or use of iterative reconstruction technique. CONTRAST:  OMNIPAQUE  IOHEXOL  300 MG/ML  SOLN COMPARISON:  CT dated 01/28/2018. chest CT dated 03/26/2021. FINDINGS: Lower chest: Partially visualized calcified nodule at the left lung base measures 2 cm, increased in size  since 2022. This was previously suggested to be a benign lesion. There is coronary vascular calcification. There is left is minus changes of the lung bases. No intra-abdominal free air or free fluid. Hepatobiliary: Fatty appearing liver. No biliary dilatation. Cholecystectomy. Pancreas: Unremarkable. No pancreatic ductal dilatation or surrounding inflammatory changes. Spleen: Normal in size without focal abnormality. Adrenals/Urinary Tract: The adrenal glands unremarkable. There is mild bilateral renal parenchyma atrophy. Small nonobstructing bilateral renal calculi measure up to 4 mm in the inferior pole of the left kidney. No obstructing stone. No hydronephrosis on the left. There is mild right hydronephrosis likely secondary to decreased motility of the right ureter due to inflammatory changes of the aorta versus possible adhesion of the distal right ureter to the right common iliac artery bypass graph (60/2) and resulting stricture. The urinary bladder is collapsed. Stomach/Bowel: Small hiatal hernia. Mild sigmoid diverticulosis. There is no bowel obstruction. The appendix appears to extend from the anterior right lower quadrant posterior medially with the tip of the appendix abutting the right common iliac artery bypass graft in similar location as the prior CT  likely representing adhesion. There is inflammatory changes and thickening of the appendix and inflammatory changes and soft tissue thickening around the right common iliac limb of the bypass graft. Overall the epicenter of inflammatory process appears around the right iliac limb of the bypass graft rather than the appendix. Vascular/Lymphatic: Advanced aortoiliac atherosclerotic disease. Chronic narrowing and scarring of the distal native abdominal aorta and iliac arteries status post aortobifemoral bypass graft. Diffuse circumferential thickening and inflammatory changes along the right iliac limb of the bypass graft. There is infiltration of the  surrounding fat plane. Tethering of several small bowel loops and tip of the appendix as well as distal right ureter to the right iliac limb of the bypass graft consistent with adhesions. The graft remains patent. No portal venous gas. There is no adenopathy. Reproductive: The prostate is grossly unremarkable Other: None Musculoskeletal: Total left hip arthroplasty. Degenerative changes of the spine. No acute osseous pathology. IMPRESSION: 1. Aortobifemoral bypass graft with tethering of loops of small bowel, tip of the appendix, and the distal right ureter to the right iliac limb of the bypass graft consistent with adhesions. 2. Inflammatory changes and circumferential thickening of the right iliac limb of the bypass graft as well as thickening of the appendix. Findings favored to represent an inflammatory/infectious process involving the right iliac limb of the bypass graft with associated reactive inflammation/thickening of the appendix and less likely an acute appendicitis and resulting inflammation of the right iliac limb of the bypass graft. Clinical correlation and vascular surgery consult is advised. 3. Mild right hydronephrosis likely secondary to adhesion of the distal right ureter to the right iliac limb of the bypass graft. 4. Small nonobstructing bilateral renal calculi. 5. Fatty liver. 6.  Aortic Atherosclerosis (ICD10-I70.0). Electronically Signed   By: Vanetta Chou M.D.   On: 08/03/2024 15:57    Microbiology: Results for orders placed or performed during the hospital encounter of 08/04/24  Blood culture (routine x 2)     Status: None   Collection Time: 08/04/24  5:22 PM   Specimen: BLOOD  Result Value Ref Range Status   Specimen Description BLOOD LEFT ANTECUBITAL  Final   Special Requests   Final    BOTTLES DRAWN AEROBIC ONLY Blood Culture results may not be optimal due to an inadequate volume of blood received in culture bottles   Culture   Final    NO GROWTH 5 DAYS Performed at  [facility], [address], [place], [place] [address]    Report Status 08/09/2024 FINAL  Final  Blood culture (routine x 2)     Status: None   Collection Time: 08/04/24  9:05 PM   Specimen: BLOOD  Result Value Ref Range Status   Specimen Description BLOOD SITE NOT SPECIFIED  Final   Special Requests   Final    BOTTLES DRAWN AEROBIC ONLY Blood Culture adequate volume   Culture   Final    NO GROWTH 5 DAYS Performed at [facility], [address], [place], [place] [address]    Report Status 08/09/2024 FINAL  Final    Labs: CBC: Recent Labs  Lab 08/06/24 0512 08/07/24 0319 08/08/24 0914 08/09/24 0424 08/10/24 0521  WBC 12.9* 12.9* 10.1 8.2 9.8  NEUTROABS  --  8.8*  --   --   --   HGB 11.8* 11.5* 10.6* 11.2* 12.0*  HCT 36.8* 35.1* 32.9* 34.6* 36.9*  MCV 87.8 86.2 87.0 86.3 85.8  PLT 285 316 286 321 354  Basic Metabolic Panel: Recent Labs  Lab 08/08/24 0229 08/09/24 0424 08/10/24 0521 08/11/24 0906 08/12/24 0412  NA 136 135 140 139 140  K 3.6 3.2* 3.5 3.4* 3.9  CL 96* 93* 97* 97* 101  CO2 28 30 30 29 29   GLUCOSE 158* 125* 132* 193* 145*  BUN 22 23 18 14 15   CREATININE 2.20* 2.08* 1.81* 1.61* 1.59*  CALCIUM  8.9 8.7* 9.1 9.2 9.1  MG  --   --  1.7 1.7  --   PHOS  --   --  2.9  --   --    Liver Function Tests: Recent Labs  Lab 08/07/24 0319  AST 15  ALT 12  ALKPHOS 51  BILITOT 1.1  PROT 6.0*  ALBUMIN  2.5*   CBG: Recent Labs  Lab 08/11/24 1201 08/11/24 1553 08/11/24 2021 08/12/24 0755 08/12/24 1119  GLUCAP 128* 175* 159* 153* 149*    Discharge time spent: greater than 30 minutes.  Signed: Owen DELENA Lore, MD [facility] 08/12/2024

## 2024-08-12 NOTE — TOC Transition Note (Signed)
 Transition of Care [facility]) - Discharge Note   Patient Details  Name: Alfred Carney MRN: [id number] Date of Birth: 1951-06-21  Transition of Care [facility]) CM/SW Contact:  Rosaline JONELLE Joe, RN Phone Number: 08/12/2024, 11:20 AM   Clinical Narrative:    CM called and spoke with [facility] and bedside teaching has been completed yesterday with the patient/ family regarding home infusion.  I called Pam chandler, RNCM with [facility] and she is sending medications to the home that will be available today.  Patient is safe to discharge home once PICC line has extension placed.     Barriers to Discharge: Continued Medical Work up   Patient Goals and CMS Choice Patient states their goals for this hospitalization and ongoing recovery are:: return home          Discharge Placement                       Discharge Plan and Services Additional resources added to the After Visit Summary for     Discharge Planning Services: CM Consult                      HH Arranged: RN [facility] Agency: [facility] Date [facility] Agency Contacted: 08/11/24 Time HH Agency Contacted: 1547 Representative spoke with at [facility] Agency: Darleene  Social Drivers of Health (SDOH) Interventions SDOH Screenings   Food Insecurity: No Food Insecurity (08/05/2024)  Housing: Low Risk  (08/05/2024)  Transportation Needs: No Transportation Needs (08/05/2024)  Utilities: Not At Risk (08/05/2024)  Alcohol Screen: Low Risk  (05/19/2024)  Depression (PHQ2-9): Medium Risk (06/30/2024)  Financial Resource Strain: Low Risk  (05/19/2024)  Physical Activity: Inactive (05/19/2024)  Social Connections: Moderately Integrated (08/05/2024)  Stress: No Stress Concern Present (05/19/2024)  Tobacco Use: Medium Risk (08/04/2024)  Health Literacy: Adequate Health Literacy (05/19/2024)     Readmission Risk Interventions     No data to display

## 2024-08-12 NOTE — Plan of Care (Signed)

## 2024-08-12 NOTE — Progress Notes (Signed)
 Peripherally Inserted Central Catheter Placement  The IV Nurse has discussed with the patient and/or persons authorized to consent for the patient, the purpose of this procedure and the potential benefits and risks involved with this procedure.  The benefits include less needle sticks, lab draws from the catheter, and the patient may be discharged home with the catheter. Risks include, but not limited to, infection, bleeding, blood clot (thrombus formation), and puncture of an artery; nerve damage and irregular heartbeat and possibility to perform a PICC exchange if needed/ordered by physician.  Alternatives to this procedure were also discussed.  Bard Power PICC patient education guide, fact sheet on infection prevention and patient information card has been provided to patient /or left at bedside.    PICC Placement Documentation  PICC Single Lumen 08/12/24 Right Cephalic 43 cm 0 cm (Active)  Indication for Insertion or Continuance of Line Home intravenous therapies (PICC only) 08/12/24 0800  Exposed Catheter (cm) 0 cm 08/12/24 0800  Site Assessment Clean, Dry, Intact 08/12/24 0800  Line Status Flushed;Saline locked;Blood return noted 08/12/24 0800  Dressing Type Transparent;Securing device 08/12/24 0800  Dressing Status Antimicrobial disc/dressing in place;Clean, Dry, Intact 08/12/24 0800  Line Care Connections checked and tightened 08/12/24 0800  Line Adjustment (NICU/IV Team Only) No 08/12/24 0800  Dressing Intervention New dressing;Adhesive placed at insertion site (IV team only) 08/12/24 0800  Dressing Change Due 08/19/24 08/12/24 0800       Alfred Carney 08/12/2024, 8:49 AM

## 2024-08-12 NOTE — Progress Notes (Addendum)
  Progress Note    08/12/2024 8:11 AM * No surgery found *  Subjective: Ambulating from bathroom this morning.  Plan for PICC placement soon.   Vitals:   08/11/24 2022 08/12/24 0438  BP: (!) 139/94 115/83  Pulse: 63 (!) 56  Resp: 18 18  Temp: 98.4 F (36.9 C) 97.7 F (36.5 C)  SpO2: 97% 99%   Physical Exam: General: No acute distress Abdomen: Soft, NT ND Neurologic: alert and oriented  CBC    Component Value Date/Time   WBC 9.8 08/10/2024 0521   RBC 4.30 08/10/2024 0521   HGB 12.0 (L) 08/10/2024 0521   HCT 36.9 (L) 08/10/2024 0521   PLT 354 08/10/2024 0521   MCV 85.8 08/10/2024 0521   MCH 27.9 08/10/2024 0521   MCHC 32.5 08/10/2024 0521   RDW 13.6 08/10/2024 0521   LYMPHSABS 2.9 08/07/2024 0319   MONOABS 1.0 08/07/2024 0319   EOSABS 0.1 08/07/2024 0319   BASOSABS 0.1 08/07/2024 0319    BMET    Component Value Date/Time   NA 140 08/12/2024 0412   NA 140 06/15/2024 0927   K 3.9 08/12/2024 0412   CL 101 08/12/2024 0412   CO2 29 08/12/2024 0412   GLUCOSE 145 (H) 08/12/2024 0412   BUN 15 08/12/2024 0412   BUN 22 06/15/2024 0927   CREATININE 1.59 (H) 08/12/2024 0412   CREATININE 1.16 01/13/2015 0936   CALCIUM  9.1 08/12/2024 0412   GFRNONAA 45 (L) 08/12/2024 0412   GFRNONAA 53 (L) 07/19/2014 1156   GFRAA >60 06/11/2020 0754   GFRAA 61 07/19/2014 1156    INR    Component Value Date/Time   INR 1.1 06/10/2020 0048    No intake or output data in the 24 hours ending 08/12/24 9188   Assessment/Plan:  73 y.o. 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  He has CT scan on admission with evidence of tethering loops of small bowel and appendix along the right limb of his bypass graft with inflammation.   He has had a repeat CT that showed no major change.  White count is normalized.  If the graft is truly infected it would be extremely high risk for  surgical intervention with high mobility and mortality risk.  Therefore the plan is for prolonged course of antibiotics and a repeat CT scan in the office in 6 weeks.  Follow-up has been scheduled. Okay for discharge from surgical perspective.

## 2024-08-16 ENCOUNTER — Telehealth: Payer: Self-pay

## 2024-08-16 NOTE — Patient Instructions (Signed)
 Visit Information  Thank you for taking time to visit with me today. Please don't hesitate to contact me if I can be of assistance to you.  Patient instructions: contact the home health agency for PICC line concerns.  follow up with his provider for any ongoing/ new overall symptoms.  seek emergency medical services for more severe symptoms  Keep follow up appointments with providers  Patient verbalizes understanding of instructions and care plan provided today and agrees to view in MyChart. Active MyChart status and patient understanding of how to access instructions and care plan via MyChart confirmed with patient.     The patient has been provided with contact information for the care management team and has been advised to call with any health related questions or concerns.   Please call the care guide team at [phone] if you need to cancel or reschedule your appointment.   Please call the Suicide and Crisis Lifeline: 988 call the [place]  National Suicide Prevention Lifeline: 912-087-4537 or TTY: (646)423-4658 TTY 612-661-5054) to talk to a trained counselor call 1-800-273-TALK (toll free, 24 hour hotline) if you are experiencing a Mental Health or Behavioral Health Crisis or need someone to talk to.  Arvin Seip RN, BSN, CCM [employer], Population Health Case Manager Phone: [phone]

## 2024-08-16 NOTE — Transitions of Care (Post Inpatient/ED Visit) (Signed)
 08/16/2024  Name: Alfred Carney MRN: [id number] DOB: 04/21/1951  Today's TOC FU Call Status: Today's TOC FU Call Status:: Successful TOC FU Call Completed TOC FU Call Complete Date: 08/16/24  Patient's Name and Date of Birth confirmed. Name, DOB (patients wife Alfred Carney/ DPR on call with patient to assist with patients health history.)  Transition Care Management Follow-up Telephone Call Date of Discharge: 08/12/24 Discharge Facility: [facility]) Type of Discharge: Inpatient Admission Primary Inpatient Discharge Diagnosis:: intra-abdominal infection How have you been since you were released from the hospital?: Better Any questions or concerns?: Yes Patient Questions/Concerns:: Patient states he would like to know if it's ok to take OTC tylenol .  Patient states he is currently taking OTC tylenol  extra strength. Patient Questions/Concerns Addressed: Other: (patient advised to call and/ or send message to his primary care provider to confirm ok to take OTC tylenol .)  Items Reviewed: Did you receive and understand the discharge instructions provided?: Yes Medications obtained,verified, and reconciled?: Yes (Medications Reviewed) Any new allergies since your discharge?: No Dietary orders reviewed?: Yes Type of Diet Ordered:: low salt heart healthy Do you have support at home?: Yes People in Home [RPT]: spouse Name of Support/Comfort Primary Source: Alfred Quarry  Medications Reviewed Today: Medications Reviewed Today     Reviewed by Kurt Azimi E, RN (Registered Nurse) on 08/16/24 at 1446  Med List Status: <None>   Medication Order Taking? Sig Documenting Provider Last Dose Status Informant  acetaminophen  (TYLENOL ) 500 MG tablet [id number] Yes Take 500 mg by mouth every 6 (six) hours as needed. Patient reports he is taking. [provider]  Active   albuterol  (VENTOLIN  HFA) 108 (90 Base) MCG/ACT inhaler [id number] Yes Inhale 2 puffs into the  lungs every 4 (four) hours as needed for wheezing or shortness of breath. Darlean Ozell NOVAK, MD  Active Self, Pharmacy Records  allopurinol  (ZYLOPRIM ) 100 MG tablet [id number] Yes Take 2 tablets (200 mg total) by mouth daily. Corwin Antu, FNP  Active Self, Pharmacy Records  aspirin  EC 81 MG tablet [id number] Yes Take 1 tablet (81 mg total) by mouth daily. Swallow whole. Awanda City, MD  Active Self, Pharmacy Records  cyanocobalamin  (VITAMIN B12) 1000 MCG/ML injection [id number] Yes Inject 1,000 mcg into the muscle every 30 (thirty) days. [provider]  Active Self, Pharmacy Records           Med Note (COFFELL, Alfred Carney Aug 05, 2024  3:26 PM) Administered by MD office.  furosemide  (LASIX ) 20 MG tablet [id number] Yes Take 2 tablets (40 mg total) by mouth daily as needed (increase fluid retension.). Alfred Serum A, MD  Active   glucose blood test strip [id number] Yes Use as instructed Corwin Antu, FNP  Active Self, Pharmacy Records  Lancet Device MISC [id number] Yes 1 Lancet by Does not apply route daily as needed. Corwin Antu, FNP  Active Self, Pharmacy Records  metoprolol  tartrate (LOPRESSOR ) 25 MG tablet [id number] Yes Take 1 tablet (25 mg total) by mouth daily in the afternoon.  Patient taking differently: Take 25 mg by mouth daily.   Corwin Antu, FNP  Active Self, Pharmacy Records           Med Note (COFFELL, Alfred Carney Aug 05, 2024  3:21 PM) Patient states thinks this replaced metoprolol  succinate 25mg . He confirms he has only been taking the metoprolol  tartrate the past 2 days, not taking both.  omeprazole  (PRILOSEC) 20 MG capsule [id number] Yes Take 1 capsule (  20 mg total) by mouth daily. Corwin Antu, FNP  Active Self, Pharmacy Records  oxycodone  (OXY-IR) 5 MG capsule [id number] Yes Take 1 capsule (5 mg total) by mouth every 6 (six) hours as needed for up to 5 days. Regalado, Belkys A, MD  Active   OXYGEN  [id number] Yes Inhale 2-4 L into the lungs continuous. 2L/min  baseline, increased to 4L/min if needed for exertion. [provider]  Active Self, Pharmacy Records  piperacillin -tazobactam (ZOSYN ) IVPB [id number] Yes Inject 13.5 g into the vein daily. Indication:  Bypass graft infection  First Dose: Yes Last Day of Therapy:  09/16/24 Labs - Once weekly:  CBC/D and BMP, Labs - Once weekly: ESR and CRP Method of administration: Elastomeric (Continuous infusion) Method of administration may be changed at the discretion of home infusion pharmacist based upon assessment of the patient and/or caregiver's ability to self-administer the medication ordered. Overton Constance DASEN, MD  Active   pravastatin  (PRAVACHOL ) 40 MG tablet [id number] Yes Take 1 tablet by mouth once daily  Patient taking differently: Take 40 mg by mouth at bedtime.   Corwin Antu, FNP  Active Self, Pharmacy Records  tamsulosin  (FLOMAX ) 0.4 MG CAPS capsule [id number] Yes Take 1 capsule (0.4 mg total) by mouth at bedtime. Corwin Antu, FNP  Active Self, Pharmacy Records            Home Care and Equipment/Supplies: Were Home Health Services Ordered?: Yes Name of Home Health Agency:: [facility] Has Agency set up Carney time to come to your home?: Yes First Home Health Visit Date: 08/14/24 Any new equipment or medical supplies ordered?: Yes Name of Medical supply agency?: patient has PICC line Were you able to get the equipment/medical supplies?: Yes Do you have any questions related to the use of the equipment/supplies?: No  Functional Questionnaire: Do you need assistance with bathing/showering or dressing?: No Do you need assistance with meal preparation?: Yes Do you need assistance with eating?: No Do you have difficulty maintaining continence: No Do you need assistance with getting out of bed/getting out of Carney chair/moving?: No Do you have difficulty managing or taking your medications?: No  Follow up appointments reviewed: PCP Follow-up appointment confirmed?: No (patient states will  contact primary provider regarding hospital visit.) Specialist Hospital Follow-up appointment confirmed?: Yes Date of Specialist follow-up appointment?: 09/20/24 Follow-Up Specialty Provider:: Dr. Overton Do you need transportation to your follow-up appointment?: No Do you understand care options if your condition(s) worsen?: Yes-patient verbalized understanding  SDOH Interventions Today    Flowsheet Row Most Recent Value  SDOH Interventions   Food Insecurity Interventions Intervention Not Indicated  Housing Interventions Intervention Not Indicated  Transportation Interventions Intervention Not Indicated  Utilities Interventions Intervention Not Indicated   Discussed and offered 30 day TOC program.  Patient  declined.  The patient has been provided with contact information for the care management team and has been advised to call with any health -related questions or concerns.  The patient verbalized understanding with current plan of care.  The patient is directed to their insurance card regarding availability of benefits coverage.    Arvin Seip RN, BSN, CCM [employer], Population Health Case Manager Phone: [phone]

## 2024-08-16 NOTE — Telephone Encounter (Signed)
 Copied from CRM #[id number]. Topic: Clinical - Medical Advice >> Aug 16, 2024  4:25 PM Kevelyn M wrote: Reason for CRM: Patient is calling to see if he needs by Tabitha to be seen before his appointment in January. He now has a pic line. He wanted Llindsey to give him a call back.  Call back: [phone]

## 2024-08-17 NOTE — Telephone Encounter (Signed)
 Spoke with Alfred Carney. He has a pending appt with Tabitha on 10/05/24. Alfred Carney now has a picc line and HH is coming out tomorrow to check things and draw some labs. Alfred Carney just wants to know if Tabitha wants to see him before the date he is already scheduled for. Per the Alfred Carney he is NOT having any issues he feels like he needs to be seen for at this time.

## 2024-08-18 ENCOUNTER — Other Ambulatory Visit: Payer: Self-pay

## 2024-08-18 DIAGNOSIS — I7143 Infrarenal abdominal aortic aneurysm, without rupture: Secondary | ICD-10-CM

## 2024-08-18 NOTE — Telephone Encounter (Signed)
 The only issue is that in the notes it looks like they d/c his irbesartan  and spironolactone  and it looks like he needs a repeat of his lab for creatinine/kidney function. Is he having this done elsewhere? Does he have a f/u with his cardiologist?   If he has a f/u with the cardiologist in the next week or so then he doesn't need to see me and or if someone is not determining whether to restart those meds specifically otherwise he will need to see me as if he needs these meds then I don't want him without if they can be restarted.

## 2024-08-18 NOTE — Telephone Encounter (Signed)
 Spoke with pt and he is aware of Tabitha's response. Pt would much rather come here and see Tabitha. Appointment has been scheduled for 08/24/24 at 1240. Home Health is coming out tomorrow to draw labs from his picc line, he will make sure they are going to check his kidney function and have them fax the results here to our office.

## 2024-08-18 NOTE — Addendum Note (Signed)
 Addended by: RAYNA MOATS A on: 08/18/2024 09:12 AM   Modules accepted: Orders

## 2024-08-19 NOTE — Telephone Encounter (Unsigned)
 Copied from CRM #[id number]. Topic: Clinical - Medical Advice >> Aug 16, 2024  4:25 PM Kevelyn M wrote: Reason for CRM: Patient is calling to see if he needs by Tabitha to be seen before his appointment in January. He now has a pic line. He wanted Llindsey to give him a call back.  Call back: [phone] >> Aug 19, 2024 10:08 AM Rea ORN wrote: April with [facility] called to speak with CMA or Nurse. She is looking for lab orders that she needs to do for pt. April stated she is currently drawing labs for someone else and is asking if there are any additional labs PCP wants to order for him. Please call back as soon as possible because she is with the pt now, [phone]

## 2024-08-19 NOTE — Telephone Encounter (Signed)
 Spoke with April and advised her that Tabitha wanted the pt's kidney function checked. She states that this was not ordered so I have given her a verbal order for this. Nothing further was needed at this time.

## 2024-08-24 ENCOUNTER — Inpatient Hospital Stay: Admitting: Family

## 2024-08-27 ENCOUNTER — Encounter: Payer: Self-pay | Admitting: Family

## 2024-08-27 ENCOUNTER — Ambulatory Visit: Admitting: Family

## 2024-08-27 VITALS — BP 124/80 | HR 80 | Temp 98.0°F | Ht 69.0 in | Wt 271.0 lb

## 2024-08-27 DIAGNOSIS — E538 Deficiency of other specified B group vitamins: Secondary | ICD-10-CM

## 2024-08-27 LAB — LAB REPORT - SCANNED: EGFR: 75

## 2024-08-27 MED ORDER — CYANOCOBALAMIN 1000 MCG/ML IJ SOLN
1000.0000 ug | Freq: Once | INTRAMUSCULAR | Status: AC
  Administered 2024-08-27: 1000 ug via INTRAMUSCULAR

## 2024-08-27 NOTE — Progress Notes (Unsigned)
 Established Patient Office Visit  Subjective:      CC:  Chief Complaint  Patient presents with   Hospitalization Follow-up    HPI: Alfred Carney is a 73 y.o. male presenting on 08/27/2024 for Hospitalization Follow-up .  Discussed the use of AI scribe software for clinical note transcription with the patient, who gave verbal consent to proceed.  History of Present Illness Alfred Carney is a 73 year old male with a history of abdominal infection and PICC line placement who presents for follow-up and lab work coordination.  He has ongoing issues related to an abdominal infection. Previously, he was sent to the emergency department for an intra-abdominal infection and suspected appendicitis. A CT scan of the abdomen and pelvis with contrast on November 18th showed inflammatory changes in the right lower limb of his bypass graft and thickening of the appendix. He was started on broad-spectrum antibiotics and is currently on a PICC line for antibiotic administration, which is expected to continue until January.  He feels better but still experiences fatigue and weakness. He has been more active than usual, walking around more, but still lacks stamina. He reports feeling tired and weak, and has been more active than usual, but still lacks stamina. He reports getting short of breath fairly quickly with activity and does not report any chest pain out of the ordinary.  He is currently on metoprolol  once daily for heart issues and has been using Lasix  as needed for fluid management, noting occasional swelling in his feet and hands. His kidney function is being monitored weekly, with recent lab results including a creatinine level of 1.7 and a more recent value of 49. He had an acute kidney injury during his hospital stay, and certain medications like Aldactone  and Vanco were held. He continues to manage his chronic heart failure with metoprolol  and is on a blood thinner.  He is not currently on  insulin  for diabetes but was on a sliding scale during his hospital stay. He maintains a good fluid intake, keeping ice water nearby at all times. He has lost 61 pounds, attributed to fluid loss, and is monitoring for any signs of fluid retention.         Social history:  Relevant past medical, surgical, family and social history reviewed and updated as indicated. Interim medical history since our last visit reviewed.  Allergies and medications reviewed and updated.  DATA REVIEWED: CHART IN EPIC     ROS: Negative unless specifically indicated above in HPI.   Current Medications[1]        Objective:        BP 124/80 (BP Location: Left Arm, Patient Position: Sitting, Cuff Size: Large)   Pulse 80   Temp 98 F (36.7 C) (Temporal)   Ht 5' 9 (1.753 m)   Wt 271 lb (122.9 kg)   SpO2 98% Comment: 2L pulsed  BMI 40.02 kg/m   Physical Exam VITALS: P- 80, BP- 124/ CARDIOVASCULAR: Heart with extra beat, otherwise normal.  Wt Readings from Last 3 Encounters:  08/27/24 271 lb (122.9 kg)  08/04/24 275 lb 2.2 oz (124.8 kg)  08/02/24 275 lb 3.2 oz (124.8 kg)    Physical Exam Vitals reviewed.  Constitutional:      General: He is not in acute distress.    Appearance: Normal appearance. He is normal weight. He is not ill-appearing, toxic-appearing or diaphoretic.  Cardiovascular:     Rate and Rhythm: Normal rate and regular rhythm.  Pulmonary:  Effort: Pulmonary effort is normal.     Breath sounds: Normal breath sounds.  Musculoskeletal:        General: Normal range of motion.  Neurological:     General: No focal deficit present.     Mental Status: He is alert and oriented to person, place, and time. Mental status is at baseline.  Psychiatric:        Mood and Affect: Mood normal.        Behavior: Behavior normal.        Thought Content: Thought content normal.        Judgment: Judgment normal.     Wt Readings from Last 3 Encounters:  08/27/24 271 lb (122.9  kg)  08/04/24 275 lb 2.2 oz (124.8 kg)  08/02/24 275 lb 3.2 oz (124.8 kg)   Temp Readings from Last 3 Encounters:  08/27/24 98 F (36.7 C) (Temporal)  08/12/24 97.7 F (36.5 C) (Oral)  08/02/24 98.5 F (36.9 C) (Temporal)   BP Readings from Last 3 Encounters:  08/27/24 124/80  08/12/24 115/83  08/02/24 126/88   Pulse Readings from Last 3 Encounters:  08/27/24 80  08/12/24 (!) 56  08/02/24 70    Wt Readings from Last 3 Encounters:  08/27/24 271 lb (122.9 kg)  08/04/24 275 lb 2.2 oz (124.8 kg)  08/02/24 275 lb 3.2 oz (124.8 kg)        Results LABS Creatinine: 49 (08/18/2024) Creatinine: 54 (08/11/2024)  RADIOLOGY CT Abdomen Pelvis with contrast: Inflammatory changes in the right lower limb of bypass graft, thickening of appendix concerning for infection (08/03/2024) CT Abdomen Pelvis with contrast: No interval change, thick soft tissue attenuation encasing right iliac bypass graft with edema and inflammation, suspicious for infection (08/10/2024)  DIAGNOSTIC Echocardiogram: Ejection fraction 55-60%, grade 1 diastolic dysfunction (03/12/2024) EKG: Premature atrial contractions (PACs) with sinus tachycardia  Assessment & Plan:   Assessment and Plan Assessment & Plan 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 High surgical risk due to comorbidities. Managed with broad-spectrum antibiotics and PICC line for IV antibiotics. Infectious disease follow-up ongoing. - Continue broad-spectrum antibiotics via PICC line until January - Follow up with infectious disease specialist  Chronic kidney disease stage 3 with recent acute kidney injury Recent acute kidney injury with creatinine elevation to 1.7. Renal function improving with creatinine levels trending upwards. Holding ARB and monitoring kidney function weekly. - Continue to hold  ARB - Continue to monitor kidney function weekly  Peripheral arterial disease status post bypass graft Status post bypass graft with recent infection in the right iliac bypass graft. Managed conservatively due to high surgical risk. - Continue conservative management  Chronic diastolic heart failure with premature atrial contractions Chronic diastolic heart failure with premature atrial contractions. Managed with metoprolol . No recent chest pain or significant dyspnea. Monitoring for fluid retention and weight changes. - Continue metoprolol  - Monitor for fluid retention and weight changes  Hypertension Managed with metoprolol . Blood pressure low, holding valsartan  to prevent further hypotension and fatigue. - Continue metoprolol  - Hold valsartan   Anemia  Vitamin B12 deficiency Contributing to fatigue. B12 supplementation planned to improve energy levels.  Recording duration: 17 minutes      Return in about 3 months (around 11/25/2024) for f/u blood pressure.     Ginger Patrick, MSN, APRN, FNP-C [facility]        [1]  Current  Outpatient Medications:    acetaminophen  (TYLENOL ) 500 MG tablet, Take 500 mg by mouth every 6 (six) hours as needed. Patient reports he is taking., Disp: , Rfl:    albuterol  (VENTOLIN  HFA) 108 (90 Base) MCG/ACT inhaler, Inhale 2 puffs into the lungs every 4 (four) hours as needed for wheezing or shortness of breath., Disp: 8 g, Rfl: 2   allopurinol  (ZYLOPRIM ) 100 MG tablet, Take 2 tablets (200 mg total) by mouth daily., Disp: 180 tablet, Rfl: 3   aspirin  EC 81 MG tablet, Take 1 tablet (81 mg total) by mouth daily. Swallow whole., Disp: , Rfl:    cyanocobalamin  (VITAMIN B12) 1000 MCG/ML injection, Inject 1,000 mcg into the muscle every 30 (thirty) days., Disp: , Rfl:    furosemide  (LASIX ) 20 MG tablet, Take 2 tablets (40 mg total) by mouth daily as needed (increase fluid retension.)., Disp: 60 tablet, Rfl: 0   glucose blood  test strip, Use as instructed, Disp: 100 each, Rfl: 12   Lancet Device MISC, 1 Lancet by Does not apply route daily as needed., Disp: 100 each, Rfl: 3   metoprolol  tartrate (LOPRESSOR ) 25 MG tablet, Take 1 tablet (25 mg total) by mouth daily in the afternoon. (Patient taking differently: Take 25 mg by mouth daily.), Disp: 90 tablet, Rfl: 0   omeprazole  (PRILOSEC) 20 MG capsule, Take 1 capsule (20 mg total) by mouth daily., Disp: 30 capsule, Rfl: 3   OXYGEN , Inhale 2-4 L into the lungs continuous. 2L/min baseline, increased to 4L/min if needed for exertion., Disp: , Rfl:    piperacillin -tazobactam (ZOSYN ) IVPB, Inject 13.5 g into the vein daily. Indication:  Bypass graft infection  First Dose: Yes Last Day of Therapy:  09/16/24 Labs - Once weekly:  CBC/D and BMP, Labs - Once weekly: ESR and CRP Method of administration: Elastomeric (Continuous infusion) Method of administration may be changed at the discretion of home infusion pharmacist based upon assessment of the patient and/or caregiver's ability to self-administer the medication ordered., Disp: 36 Units, Rfl: 0   pravastatin  (PRAVACHOL ) 40 MG tablet, Take 1 tablet by mouth once daily (Patient taking differently: Take 40 mg by mouth at bedtime.), Disp: 90 tablet, Rfl: 0   tamsulosin  (FLOMAX ) 0.4 MG CAPS capsule, Take 1 capsule (0.4 mg total) by mouth at bedtime., Disp: 90 capsule, Rfl: 0

## 2024-08-30 ENCOUNTER — Ambulatory Visit: Payer: Self-pay | Admitting: Family

## 2024-08-30 DIAGNOSIS — E876 Hypokalemia: Secondary | ICD-10-CM

## 2024-08-31 ENCOUNTER — Other Ambulatory Visit: Payer: Self-pay | Admitting: Family

## 2024-08-31 DIAGNOSIS — E876 Hypokalemia: Secondary | ICD-10-CM

## 2024-08-31 MED ORDER — POTASSIUM CHLORIDE CRYS ER 10 MEQ PO TBCR: 10.0000 meq | EXTENDED_RELEASE_TABLET | Freq: Two times a day (BID) | ORAL | 0 refills | Status: DC

## 2024-09-01 NOTE — Telephone Encounter (Signed)
 Manuelita are you able to still see the lab report for the recent lab report on potassium?  I can't seem to see the attachment anymore.

## 2024-09-03 ENCOUNTER — Other Ambulatory Visit: Payer: Self-pay | Admitting: Family

## 2024-09-10 ENCOUNTER — Other Ambulatory Visit: Payer: Self-pay | Admitting: Family

## 2024-09-10 DIAGNOSIS — Z79899 Other long term (current) drug therapy: Secondary | ICD-10-CM

## 2024-09-20 ENCOUNTER — Inpatient Hospital Stay: Payer: Self-pay | Admitting: Internal Medicine

## 2024-09-20 ENCOUNTER — Encounter: Payer: Self-pay | Admitting: Family

## 2024-09-20 ENCOUNTER — Ambulatory Visit (HOSPITAL_COMMUNITY)
Admission: RE | Admit: 2024-09-20 | Discharge: 2024-09-20 | Disposition: A | Discharge status: Home / Self Care | Source: Ambulatory Visit | Attending: Vascular Surgery | Admitting: Vascular Surgery

## 2024-09-20 DIAGNOSIS — I7143 Infrarenal abdominal aortic aneurysm, without rupture: Secondary | ICD-10-CM | POA: Diagnosis present

## 2024-09-20 MED ORDER — IOHEXOL 350 MG/ML SOLN
80.0000 mL | Freq: Once | INTRAVENOUS | Status: AC | PRN
  Administered 2024-09-20: 80 mL via INTRAVENOUS

## 2024-09-21 MED ORDER — POTASSIUM CHLORIDE CRYS ER 10 MEQ PO TBCR: 10.0000 meq | EXTENDED_RELEASE_TABLET | Freq: Two times a day (BID) | ORAL | 1 refills | Status: DC

## 2024-09-28 ENCOUNTER — Ambulatory Visit: Attending: Vascular Surgery | Admitting: Vascular Surgery

## 2024-09-28 ENCOUNTER — Encounter: Payer: Self-pay | Admitting: Vascular Surgery

## 2024-09-28 VITALS — BP 144/82 | HR 75 | Temp 98.3°F | Resp 22 | Ht 69.0 in | Wt 275.0 lb

## 2024-09-28 DIAGNOSIS — Z8679 Personal history of other diseases of the circulatory system: Secondary | ICD-10-CM | POA: Diagnosis not present

## 2024-09-28 DIAGNOSIS — Z9889 Other specified postprocedural states: Secondary | ICD-10-CM | POA: Diagnosis not present

## 2024-09-28 DIAGNOSIS — B999 Unspecified infectious disease: Secondary | ICD-10-CM

## 2024-09-28 MED ORDER — AMOXICILLIN-POT CLAVULANATE 875-125 MG PO TABS: 1.0000 | ORAL_TABLET | Freq: Two times a day (BID) | ORAL | 0 refills | Status: DC

## 2024-09-28 NOTE — Progress Notes (Signed)
 "   Patient name: Alfred Carney MRN: [id number] DOB: [birth year] Sex: male  REASON FOR CONSULT: Hospital follow-up  HPI: Alfred Carney is a 74 y.o. male, with history of COPD on home oxygen , CKD 3, diabetes, morbid obesity that presents for hospital follow-up.  Previously seen in the hospital with concern for appendicitis with inflammatory changes around his prior type IV thoracoabdominal repair done in 2004 by Dr. Eliza.  Ultimately did not feel he was a surgical candidate and recommended ID consult with long term antibiotics and evaluated by general surgery and vascular.  He completed 6 weeks of IV Zosyn  but states his ID appointment was moved back to next week and he is not currently on any antibiotic therapy and his PICC line was removed in early January.  He did have a plan for lifelong antibiotic suppressive therapy per the ID consult in the hospital..    States his abdominal pain is much better.  He is eating.  Bowel function.  Past Medical History:  Diagnosis Date   Chronic obstructive pulmonary disease (HCC) 2008   Moderate   Colon polyps    Community acquired pneumonia    Coronary artery disease    Degenerative joint disease    Diabetes mellitus without complication (HCC)    type 2   Emphysema lung (HCC)    Enlarged liver    fatty liver by '09 CT   Gastroesophageal reflux disease    Gout    H/O hiatal hernia    History of [place] spotted fever    Possible history of [place] Spotted Fever   Hyperlipidemia    Hypertension    Hypokalemia    diuretic induced, resolved   Microcytic anemia    iron pills   Morbid obesity (HCC)    Shortness of breath    Skin cancer    skin cancer lip removed   Sleep apnea    not currently using CPAP 05/20/13   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 LEVEL  10/23/2018       ESOPHAGOGASTRODUODENOSCOPY (EGD) WITH PROPOFOL  N/A 01/22/2018   Procedure: ESOPHAGOGASTRODUODENOSCOPY (EGD) WITH PROPOFOL ;  Surgeon: Shila Gustav GAILS, MD;  Location: [facility];  Service: Endoscopy;  Laterality: N/A;   HARDWARE REMOVAL Left 05/26/2013   Procedure: HARDWARE REMOVAL;  Surgeon: Jerona GAILS Sage, MD;  Location: [facility] OR;  Service: Orthopedics;  Laterality: Left;  Left Total Hip Arthroplasty, Removal of Deep Hardware   HIP SURGERY     Status post left hip surgery with bone grafting   IR INJECT/THERA/INC NEEDLE/CATH/PLC EPI/LUMB/SAC W/IMG  10/23/2018   LEFT HEART CATHETERIZATION WITH CORONARY ANGIOGRAM N/A 01/31/2014   Procedure: LEFT HEART CATHETERIZATION WITH CORONARY ANGIOGRAM;  Surgeon: Lonni JONETTA Cash, MD;  Location: [facility] CATH LAB;  Service: Cardiovascular;  Laterality: N/A;   LUMBAR LAMINECTOMY/DECOMPRESSION MICRODISCECTOMY Left 10/28/2018   Procedure: Left Lumbar three-four Extraforaminal microdiscectomy;  Surgeon: Alix Charleston, MD;  Location: [facility] OR;  Service: Neurosurgery;  Laterality: Left;   THORACOABDOMINAL AORTIC ANEURYSM REPAIR     with right femoral and left iliac BPG and reimplantation of renal  arteries.   TONSILLECTOMY     TONSILLECTOMY     TOTAL HIP ARTHROPLASTY Left 05/26/2013   Procedure: TOTAL HIP ARTHROPLASTY;  Surgeon: Jerona LULLA Sage, MD;  Location: [facility] OR;  Service: Orthopedics;  Laterality: Left;  Left Total Hip Arthroplasty, Removal Deep Hardware    Family History  Problem Relation Age of Onset   Osteoarthritis Mother    Diabetes Mother    Pulmonary embolism Mother    Heart disease Father        Coronary Artery Disease   Stroke Father    Multiple sclerosis Sister    Factor V Leiden deficiency Sister     Emphysema Sister    Hyperlipidemia Brother    Heart attack Maternal Grandmother    Lung cancer Maternal Grandfather        smoked    SOCIAL HISTORY: Social History   Socioeconomic History   Marital status: Married    Spouse name: [name]   Number of children: 4   Years of education: GED, ITT   Highest education level: Associate degree: occupational, scientist, product/process development, or vocational program  Occupational History   Occupation: architectural drawing-retired  Tobacco Use   Smoking status: Former    Current packs/day: 0.00    Average packs/day: 1 pack/day for 35.0 years (35.0 ttl pk-yrs)    Types: Cigarettes    Start date: 09/17/1971    Quit date: 09/16/2006    Years since quitting: 18.0   Smokeless tobacco: Never  Vaping Use   Vaping status: Never Used  Substance and Sexual Activity   Alcohol use: No    Alcohol/week: 0.0 standard drinks of alcohol   Drug use: No   Sexual activity: Not Currently  Other Topics Concern   Not on file  Social History Narrative   Lives with wife   Right Handed   Drinks 4-5 cups caffeine daily   Social Drivers of Health   Tobacco Use: Medium Risk (09/28/2024)   Patient History    Smoking Tobacco Use: Former    Smokeless Tobacco Use: Never    Passive Exposure: Not on Actuary Strain: Low Risk (05/19/2024)   Overall Financial Resource Strain (CARDIA)    Difficulty of Paying Living Expenses: Not hard at all  Food Insecurity: No Food Insecurity (08/16/2024)   Epic    Worried About Radiation Protection Practitioner of Food in the Last Year: Never true    Ran Out of Food in the Last Year: Never true  Transportation Needs: No Transportation Needs (08/16/2024)   Epic    Lack of Transportation (Medical): No    Lack of Transportation (Non-Medical): No  Physical Activity: Inactive (05/19/2024)   Exercise Vital Sign    Days of Exercise per Week: 0 days    Minutes of Exercise per Session: 0 min  Stress: No Stress Concern Present (05/19/2024)   [employer] of  Occupational Health - Occupational Stress Questionnaire    Feeling of Stress: Not at all  Social Connections: Moderately Integrated (08/05/2024)   Social Connection and Isolation Panel    Frequency of Communication with Friends and Family: More than three times a week    Frequency of Social Gatherings with Friends and Family: Twice a week    Attends Religious Services: Never    Database Administrator or Organizations: Yes    Attends Banker Meetings: Never    Marital Status: Married  Catering Manager Violence: Not At Risk (08/16/2024)   Epic    Fear of Current or  Ex-Partner: No    Emotionally Abused: No    Physically Abused: No    Sexually Abused: No  Depression (PHQ2-9): Low Risk (08/27/2024)   Depression (PHQ2-9)    PHQ-2 Score: 3  Recent Concern: Depression (PHQ2-9) - Medium Risk (06/30/2024)   Depression (PHQ2-9)    PHQ-2 Score: 6  Alcohol Screen: Low Risk (05/19/2024)   Alcohol Screen    Last Alcohol Screening Score (AUDIT): 0  Housing: Unknown (08/16/2024)   Epic    Unable to Pay for Housing in the Last Year: No    Number of Times Moved in the Last Year: Not on file    Homeless in the Last Year: No  Utilities: Not At Risk (08/16/2024)   Epic    Threatened with loss of utilities: No  Health Literacy: Adequate Health Literacy (05/19/2024)   B1300 Health Literacy    Frequency of need for help with medical instructions: Never    Allergies[1]  Current Outpatient Medications  Medication Sig Dispense Refill   acetaminophen  (TYLENOL ) 500 MG tablet Take 500 mg by mouth every 6 (six) hours as needed. Patient reports he is taking.     albuterol  (VENTOLIN  HFA) 108 (90 Base) MCG/ACT inhaler Inhale 2 puffs into the lungs every 4 (four) hours as needed for wheezing or shortness of breath. 8 g 2   allopurinol  (ZYLOPRIM ) 100 MG tablet Take 2 tablets (200 mg total) by mouth daily. 180 tablet 3   aspirin  EC 81 MG tablet Take 1 tablet (81 mg total) by mouth daily. Swallow whole.      cyanocobalamin  (VITAMIN B12) 1000 MCG/ML injection Inject 1,000 mcg into the muscle every 30 (thirty) days.     furosemide  (LASIX ) 20 MG tablet Take 2 tablets (40 mg total) by mouth daily as needed (increase fluid retension.). 60 tablet 0   glucose blood test strip Use as instructed 100 each 12   Lancet Device MISC 1 Lancet by Does not apply route daily as needed. 100 each 3   metoprolol  tartrate (LOPRESSOR ) 25 MG tablet Take 1 tablet (25 mg total) by mouth daily in the afternoon. (Patient taking differently: Take 25 mg by mouth daily.) 90 tablet 0   omeprazole  (PRILOSEC) 20 MG capsule Take 1 capsule by mouth once daily 90 capsule 0   OXYGEN  Inhale 2-4 L into the lungs continuous. 2L/min baseline, increased to 4L/min if needed for exertion.     potassium chloride  (KLOR-CON ) 10 MEQ tablet Take 10 mEq by mouth 2 (two) times daily.     pravastatin  (PRAVACHOL ) 40 MG tablet Take 1 tablet by mouth once daily (Patient taking differently: Take 40 mg by mouth at bedtime.) 90 tablet 0   tamsulosin  (FLOMAX ) 0.4 MG CAPS capsule Take 1 capsule (0.4 mg total) by mouth at bedtime. 90 capsule 0   No current facility-administered medications for this visit.    REVIEW OF SYSTEMS:  [X]  denotes positive finding, [ ]  denotes negative finding Cardiac  Comments:  Chest pain or chest pressure:    Shortness of breath upon exertion:    Short of breath when lying flat:    Irregular heart rhythm:        Vascular    Pain in calf, thigh, or hip brought on by ambulation:    Pain in feet at night that wakes you up from your sleep:     Blood clot in your veins:    Leg swelling:         Pulmonary    Oxygen  at  home:    Productive cough:     Wheezing:         Neurologic    Sudden weakness in arms or legs:     Sudden numbness in arms or legs:     Sudden onset of difficulty speaking or slurred speech:    Temporary loss of vision in one eye:     Problems with dizziness:         Gastrointestinal    Blood in  stool:     Vomited blood:         Genitourinary    Burning when urinating:     Blood in urine:        Psychiatric    Major depression:         Hematologic    Bleeding problems:    Problems with blood clotting too easily:        Skin    Rashes or ulcers:        Constitutional    Fever or chills:      PHYSICAL EXAM: Vitals:   09/28/24 1405  BP: (!) 144/82  Pulse: 75  Resp: (!) 22  Temp: 98.3 F (36.8 C)  TempSrc: Temporal  SpO2: 96%  Weight: 275 lb (124.7 kg)  Height: 5' 9 (1.753 m)    GENERAL: The patient is a well-nourished male, in no acute distress. The vital signs are documented above. CARDIAC: There is a regular rate and rhythm.  VASCULAR:  Minimal abdominal tenderness today Thoracoabdominal incision well healed PULMONARY: No respiratory distress. MUSCULOSKELETAL: There are no major deformities or cyanosis. NEUROLOGIC: No focal weakness or paresthesias are detected. SKIN: There are no ulcers or rashes noted. PSYCHIATRIC: The patient has a normal affect.  DATA:   CT abdomen pelvis reviewed 09/20/2024 with decreased inflammatory changes around the aortobifemoral graft with ongoing thickening of the appendix  Assessment/Plan:   74 y.o. male, with history of COPD on home oxygen , CKD 3, diabetes, morbid obesity that presents for hospital follow-up.  Previously seen in the hospital with concern for appendicitis with inflammatory changes around his right limb of prior type IV thoracoabdominal repair done in 2004 by Dr. Eliza.  Ultimately did not feel he was a surgical candidate and recommended ID consult.  He completed 6 weeks of IV Zosyn  but states his ID appointment was moved back to next week and he is not currently on any antibiotic therapy and his PICC line was removed in early January after completing the 6 weeks of IV zosyn .  Ultimately I discussed his CT scan looks better with significantly decreased inflammatory changes around the right limb of  aortobifemoral graft and less abdominal pain on evaluation.  Ongoing concern for smoldering appendicitis.  I do think he needs lifelong suppressive antibiotics since his PICC line was removed and he completed 6 weeks of IV Zosyn .  Looks like the his appointment with ID got moved back and there has been a gap in his antibiotic therapy.  I did prescribe 1 month of Augmentin  875 mg twice daily pending his appointment with ID next week and they can adjust the plan accordingly.  I will see him in 6 months with repeat CT.   I do not think he is a candidate for graft excision given his comorbidities and the complexity of excising this given this required thoracotomy with renal artery implants.   Lonni DOROTHA Gaskins, MD [facility]: [phone]        [1]  Allergies Allergen Reactions   Glucotrol  [Glipizide ] Other (See Comments)    Dizziness    Jardiance  [Empagliflozin ] Other (See Comments)    Stomach pain   "

## 2024-10-04 ENCOUNTER — Ambulatory Visit: Admitting: Internal Medicine

## 2024-10-04 ENCOUNTER — Other Ambulatory Visit: Payer: Self-pay

## 2024-10-04 VITALS — HR 91 | Temp 97.4°F | Ht 69.0 in | Wt 276.8 lb

## 2024-10-04 DIAGNOSIS — T827XXD Infection and inflammatory reaction due to other cardiac and vascular devices, implants and grafts, subsequent encounter: Secondary | ICD-10-CM | POA: Diagnosis not present

## 2024-10-04 DIAGNOSIS — N1832 Chronic kidney disease, stage 3b: Secondary | ICD-10-CM

## 2024-10-04 DIAGNOSIS — Z95828 Presence of other vascular implants and grafts: Secondary | ICD-10-CM | POA: Diagnosis not present

## 2024-10-04 NOTE — Patient Instructions (Signed)
 Continue augmentin  forever   See us  again in 4-6 weeks   Labs today

## 2024-10-04 NOTE — Progress Notes (Signed)
 "       [facility]  Patient Active Problem List   Diagnosis Date Noted   Type 2 diabetes mellitus with hyperglycemia, without long-term current use of insulin  (HCC) 08/05/2024   History of repair of thoracoabdominal aortic aneurysm (TAAA) 08/05/2024   Acute renal failure superimposed on stage 3a chronic kidney disease (HCC) 08/05/2024   Chronic obstructive pulmonary disease (HCC) 08/05/2024   Intra-abdominal infection 08/04/2024   Type 2 diabetes mellitus with hypoglycemia without coma, with long-term current use of insulin  (HCC) 04/09/2024   Hyponatremia 04/09/2024   Type 2 diabetes mellitus with stage 2 chronic kidney disease, without long-term current use of insulin  (HCC) 04/09/2024   Physical deconditioning 04/09/2024   Generalized weakness 04/09/2024   Paroxysmal atrial tachycardia 03/13/2024   Acute on chronic congestive heart failure (HCC) 03/11/2024   CKD stage 3a, GFR 45-59 ml/min (HCC) 03/11/2024   Polyarthralgia 05/20/2023   Pedal edema 05/20/2023   History of gout 05/20/2023   BPH with obstruction/lower urinary tract symptoms 10/10/2022   Obesity, Class III, BMI 40-49.9 (morbid obesity) (HCC) 10/10/2022   Psoriasis 10/10/2022   Degenerative arthritis of hip 05/08/2022   Hernia, hiatal 05/08/2022   Hypertension associated with diabetes (HCC) 05/08/2022   Hyperlipidemia associated with type 2 diabetes mellitus (HCC) 05/08/2022   Venous stasis dermatitis of both lower extremities 01/30/2021   Chronic diastolic CHF (congestive heart failure) (HCC) 06/22/2020   Posterior reversible encephalopathy syndrome 06/11/2020   Leukocytosis 10/21/2019   Chronic hypoxic respiratory failure (HCC) 02/05/2019   Solitary pulmonary nodule on lung CT 01/29/2018   Bilateral hearing loss 03/12/2017   Angina pectoris 03/12/2017   Vitamin D  deficiency 08/01/2014   CAD in native artery 09/25/2011   PAD (peripheral artery disease) 09/25/2011   Hyperlipidemia  09/25/2011   ABDOMINAL AORTIC ANEURYSM, HX OF 06/30/2007   COPD GOLD II 06/30/2007      Subjective:    Patient ID: Alfred Carney, male    DOB: 1951-01-29, 74 y.o.   MRN: [id number]  Chief Complaint  Patient presents with   Follow-up    intraabd graft infection    HPI:  Alfred Carney is a 74 y.o. male with complex bypass graft to lower extremities complicated by concern for graft infection, here for f/u   I last saw 08/04/24 when he was admitted. Imaging questions source of infection being smoldering appenditis  He was discharged on 6 wks piptazo finished 09/16/24 transitioned to augmentin   Repeat ct 09/21/24 showed improving inflammatory change but still evidence suggesting smoldering appendicitis.  He saw vascular surgery 09/28/24. Remains not a candidate for graft excision and would like indefinitie abx. Plan to repeat ct in 6 months     Allergies[1]    Outpatient Medications Prior to Visit  Medication Sig Dispense Refill   acetaminophen  (TYLENOL ) 500 MG tablet Take 500 mg by mouth every 6 (six) hours as needed. Patient reports he is taking.     albuterol  (VENTOLIN  HFA) 108 (90 Base) MCG/ACT inhaler Inhale 2 puffs into the lungs every 4 (four) hours as needed for wheezing or shortness of breath. 8 g 2   allopurinol  (ZYLOPRIM ) 100 MG tablet Take 2 tablets (200 mg total) by mouth daily. 180 tablet 3   amoxicillin -clavulanate (AUGMENTIN ) 875-125 MG tablet Take 1 tablet by mouth 2 (two) times daily. 60 tablet 0   aspirin  EC 81 MG tablet Take 1 tablet (81 mg total) by mouth daily. Swallow whole.     furosemide  (LASIX ) 20 MG tablet  Take 2 tablets (40 mg total) by mouth daily as needed (increase fluid retension.). 60 tablet 0   glucose blood test strip Use as instructed 100 each 12   Lancet Device MISC 1 Lancet by Does not apply route daily as needed. 100 each 3   metoprolol  tartrate (LOPRESSOR ) 25 MG tablet Take 1 tablet (25 mg total) by mouth daily in the afternoon. (Patient taking  differently: Take 25 mg by mouth daily.) 90 tablet 0   omeprazole  (PRILOSEC) 20 MG capsule Take 1 capsule by mouth once daily 90 capsule 0   OXYGEN  Inhale 2-4 L into the lungs continuous. 2L/min baseline, increased to 4L/min if needed for exertion.     potassium chloride  (KLOR-CON ) 10 MEQ tablet Take 10 mEq by mouth 2 (two) times daily.     pravastatin  (PRAVACHOL ) 40 MG tablet Take 1 tablet by mouth once daily (Patient taking differently: Take 40 mg by mouth at bedtime.) 90 tablet 0   tamsulosin  (FLOMAX ) 0.4 MG CAPS capsule Take 1 capsule (0.4 mg total) by mouth at bedtime. 90 capsule 0   cyanocobalamin  (VITAMIN B12) 1000 MCG/ML injection Inject 1,000 mcg into the muscle every 30 (thirty) days. (Patient not taking: Reported on 10/04/2024)     No facility-administered medications prior to visit.     Social History   Socioeconomic History   Marital status: Married    Spouse name: [name]   Number of children: 4   Years of education: GED, ITT   Highest education level: Associate degree: occupational, scientist, product/process development, or vocational program  Occupational History   Occupation: architectural drawing-retired  Tobacco Use   Smoking status: Former    Current packs/day: 0.00    Average packs/day: 1 pack/day for 35.0 years (35.0 ttl pk-yrs)    Types: Cigarettes    Start date: 09/17/1971    Quit date: 09/16/2006    Years since quitting: 18.0   Smokeless tobacco: Never  Vaping Use   Vaping status: Never Used  Substance and Sexual Activity   Alcohol use: No    Alcohol/week: 0.0 standard drinks of alcohol   Drug use: No   Sexual activity: Not Currently  Other Topics Concern   Not on file  Social History Narrative   Lives with wife   Right Handed   Drinks 4-5 cups caffeine daily   Social Drivers of Health   Tobacco Use: Medium Risk (09/28/2024)   Patient History    Smoking Tobacco Use: Former    Smokeless Tobacco Use: Never    Passive Exposure: Not on Actuary Strain: Low Risk  (05/19/2024)   Overall Financial Resource Strain (CARDIA)    Difficulty of Paying Living Expenses: Not hard at all  Food Insecurity: No Food Insecurity (08/16/2024)   Epic    Worried About Radiation Protection Practitioner of Food in the Last Year: Never true    Ran Out of Food in the Last Year: Never true  Transportation Needs: No Transportation Needs (08/16/2024)   Epic    Lack of Transportation (Medical): No    Lack of Transportation (Non-Medical): No  Physical Activity: Inactive (05/19/2024)   Exercise Vital Sign    Days of Exercise per Week: 0 days    Minutes of Exercise per Session: 0 min  Stress: No Stress Concern Present (05/19/2024)   [employer] of Occupational Health - Occupational Stress Questionnaire    Feeling of Stress: Not at all  Social Connections: Moderately Integrated (08/05/2024)   Social Connection and Isolation Panel  Frequency of Communication with Friends and Family: More than three times a week    Frequency of Social Gatherings with Friends and Family: Twice a week    Attends Religious Services: Never    Database Administrator or Organizations: Yes    Attends Banker Meetings: Never    Marital Status: Married  Catering Manager Violence: Not At Risk (08/16/2024)   Epic    Fear of Current or Ex-Partner: No    Emotionally Abused: No    Physically Abused: No    Sexually Abused: No  Depression (PHQ2-9): Low Risk (08/27/2024)   Depression (PHQ2-9)    PHQ-2 Score: 3  Recent Concern: Depression (PHQ2-9) - Medium Risk (06/30/2024)   Depression (PHQ2-9)    PHQ-2 Score: 6  Alcohol Screen: Low Risk (05/19/2024)   Alcohol Screen    Last Alcohol Screening Score (AUDIT): 0  Housing: Unknown (08/16/2024)   Epic    Unable to Pay for Housing in the Last Year: No    Number of Times Moved in the Last Year: Not on file    Homeless in the Last Year: No  Utilities: Not At Risk (08/16/2024)   Epic    Threatened with loss of utilities: No  Health Literacy: Adequate Health Literacy  (05/19/2024)   B1300 Health Literacy    Frequency of need for help with medical instructions: Never      Review of Systems    All other ros negative  Objective:    Pulse 91   Temp (!) 97.4 F (36.3 C) (Oral)   Ht 5' 9 (1.753 m)   Wt 276 lb 12.8 oz (125.6 kg)   SpO2 95%   BMI 40.88 kg/m  Nursing note and vital signs reviewed.  Physical Exam     General/constitutional: no distress, pleasant, on oxygen   HEENT: Normocephalic, PER, Conj Clear, EOMI, Oropharynx clear Neck supple CV: rrr no mrg Lungs: clear to auscultation, normal respiratory effort Abd: Soft, Nontender Ext: no edema Skin: No Rash Neuro: nonfocal MSK: no peripheral joint swelling/tenderness/warmth; back spines nontender    Labs: Lab Results  Component Value Date   WBC 9.8 08/10/2024   HGB 12.0 (L) 08/10/2024   HCT 36.9 (L) 08/10/2024   MCV 85.8 08/10/2024   PLT 354 08/10/2024   Last metabolic panel Lab Results  Component Value Date   GLUCOSE 145 (H) 08/12/2024   NA 140 08/12/2024   K 3.9 08/12/2024   CL 101 08/12/2024   CO2 29 08/12/2024   BUN 15 08/12/2024   CREATININE 1.59 (H) 08/12/2024   EGFR 75.0 08/27/2024   CALCIUM  9.1 08/12/2024   PHOS 2.9 08/10/2024   PROT 6.0 (L) 08/07/2024   ALBUMIN  2.5 (L) 08/07/2024   BILITOT 1.1 08/07/2024   ALKPHOS 51 08/07/2024   AST 15 08/07/2024   ALT 12 08/07/2024   ANIONGAP 10 08/12/2024     Micro:  Serology:  Imaging: Reviewed 09/20/24 cta abd/pelv IMPRESSION: VASCULAR   1. Overall, slightly decreased inflammatory changes surrounding the right iliac limb of the aortic bypass graft. While the overall degree of inflammation is improved, there is persistent thickening in abnormal appearance of the appendix which passes immediately anterior to the bypass graft and is essentially inseparable with the inflammatory changes surrounding the bypass graft. Findings suggest smoldering appendicitis which may be currently held in check if  the patient is on antibiotics. Secondary infection of the graft material is difficult to exclude entirely. However, the process is stable to slightly improved compared  to 08/10/24. 2. Overall no evidence of anastomotic breakdown, pseudoaneurysm or other vascular complication. 3. Stable surgical changes of distal aortic bypass.   NON-VASCULAR   1. Centrilobular pulmonary emphysema. 2. Persistently thickened appendix measuring up to 1.3 cm in diameter with mild periappendiceal inflammatory changes. 3. Additional ancillary findings as above without significant interval change.  Assessment & Plan:   Problem List Items Addressed This Visit   None Visit Diagnoses       Vascular graft infection, subsequent encounter    -  Primary   Relevant Orders   CBC   COMPLETE METABOLIC PANEL WITHOUT GFR   C-reactive protein     Stage 3b chronic kidney disease (HCC)       Relevant Orders   Vitamin D  (25 hydroxy)   Phosphorus         No orders of the defined types were placed in this encounter.     Abx: 09/16/24-c augmentin  875  11/23-11/26 cefepime  11/19-c vanc 11/19-11/23; 11/26-09/16/24 piptazo                                                                 Assessment: 74 yo male with copd on 2 liters home o2, HFpEF, ckd3, obesity, dm2, pad and AAA s/p aorta-right femoral to left external iliac artery bypass with implantation of two right renal arteries and one left renal artery on 03/15/2003, admitted 11/19 a week malaise, nausea, right sided abd pain, referred by pcp for abd ct pelv that suggest inflammatory changes on the right limb of the bypass graft as well as thickening of appendix   Repeat ct scan here a week after initial ct had shown no changes   Both vascular surgery and general surgery evaluated. Unclear if primary appendicitis with vascular graft involvement   Of note, in 2019 there was also concern for appendicitis with inflammatory changes of the right branch of the  graft -- patient received iv vanc/piptazo for a short course for his appendicitis. 01/2018 the appendix remains tethered to the graft but with resolution of inflammatory changes     At this time no immediate need/indication for surgery of either and agree with recurrence and morbid implication reasonable to treat with abx and see. Vascular team did express wish to suppress chronically   10/04/24 id clinic assessment Patient clinically feels well He said he wouldn't want and likely not able to do surgery for appendicitis given his current cardiopulmonary condition Since the transition to augmentin  patient continues to feel well Some nausea. No diarrhea/rash Appetite is good No abdominal pain  No ischemic pain in the legs   -continue augmentin  -labs today -given the gap for pseudomonas, will see patient soon in around 4 weeks to repeat evaluation and see if earlier need for repeat imaging -forward chart to dr Gretta. Agree with indefinite therapy  Follow-up: Return in about 4 weeks (around 11/01/2024).      Constance ONEIDA Passer, MD [facility] 10/04/2024, 1:21 PM     [1]  Allergies Allergen Reactions   Glucotrol  [Glipizide ] Other (See Comments)    Dizziness    Jardiance  [Empagliflozin ] Other (See Comments)    Stomach pain   "

## 2024-10-05 ENCOUNTER — Ambulatory Visit: Admitting: Family

## 2024-10-05 LAB — CBC
HCT: 42.9 % (ref 39.4–51.1)
Hemoglobin: 13.4 g/dL (ref 13.2–17.1)
MCH: 27.3 pg (ref 27.0–33.0)
MCHC: 31.2 g/dL — ABNORMAL LOW (ref 31.6–35.4)
MCV: 87.6 fL (ref 81.4–101.7)
MPV: 10 fL (ref 7.5–12.5)
Platelets: 291 Thousand/uL (ref 140–400)
RBC: 4.9 Million/uL (ref 4.20–5.80)
RDW: 14.4 % (ref 11.0–15.0)
WBC: 9.9 Thousand/uL (ref 3.8–10.8)

## 2024-10-05 LAB — COMPLETE METABOLIC PANEL WITHOUT GFR
AG Ratio: 1.6 (calc) (ref 1.0–2.5)
ALT: 51 U/L — ABNORMAL HIGH (ref 9–46)
AST: 32 U/L (ref 10–35)
Albumin: 4.3 g/dL (ref 3.6–5.1)
Alkaline phosphatase (APISO): 95 U/L (ref 35–144)
BUN: 15 mg/dL (ref 7–25)
CO2: 29 mmol/L (ref 20–32)
Calcium: 9.7 mg/dL (ref 8.6–10.3)
Chloride: 102 mmol/L (ref 98–110)
Creat: 1.15 mg/dL (ref 0.70–1.28)
Globulin: 2.7 g/dL (ref 1.9–3.7)
Glucose, Bld: 165 mg/dL — ABNORMAL HIGH (ref 65–99)
Potassium: 3.6 mmol/L (ref 3.5–5.3)
Sodium: 141 mmol/L (ref 135–146)
Total Bilirubin: 0.5 mg/dL (ref 0.2–1.2)
Total Protein: 7 g/dL (ref 6.1–8.1)

## 2024-10-05 LAB — C-REACTIVE PROTEIN: CRP: 11.5 mg/L — ABNORMAL HIGH

## 2024-10-05 LAB — PHOSPHORUS: Phosphorus: 2.7 mg/dL (ref 2.1–4.3)

## 2024-10-05 LAB — VITAMIN D 25 HYDROXY (VIT D DEFICIENCY, FRACTURES): Vit D, 25-Hydroxy: 44 ng/mL (ref 30–100)

## 2024-10-10 ENCOUNTER — Other Ambulatory Visit: Payer: Self-pay | Admitting: Vascular Surgery

## 2024-10-14 ENCOUNTER — Other Ambulatory Visit: Payer: Self-pay | Admitting: Family

## 2024-10-14 DIAGNOSIS — E1169 Type 2 diabetes mellitus with other specified complication: Secondary | ICD-10-CM

## 2024-11-16 ENCOUNTER — Ambulatory Visit: Payer: Self-pay | Admitting: Internal Medicine

## 2024-11-25 ENCOUNTER — Ambulatory Visit: Admitting: Family

## 2024-12-29 ENCOUNTER — Ambulatory Visit: Admitting: Family

## 2025-03-29 ENCOUNTER — Ambulatory Visit: Admitting: Vascular Surgery

## 2025-05-20 ENCOUNTER — Ambulatory Visit
